# Patient Record
Sex: Female | Born: 1945
Health system: Southern US, Community
[De-identification: ages and names within clinical notes are randomized; demographics above are authoritative.]

## PROBLEM LIST (undated history)

## (undated) DIAGNOSIS — M1990 Unspecified osteoarthritis, unspecified site: Secondary | ICD-10-CM

## (undated) DIAGNOSIS — N189 Chronic kidney disease, unspecified: Secondary | ICD-10-CM

## (undated) DIAGNOSIS — M109 Gout, unspecified: Secondary | ICD-10-CM

## (undated) DIAGNOSIS — L80 Vitiligo: Secondary | ICD-10-CM

## (undated) DIAGNOSIS — M329 Systemic lupus erythematosus, unspecified: Secondary | ICD-10-CM

## (undated) DIAGNOSIS — R011 Cardiac murmur, unspecified: Secondary | ICD-10-CM

## (undated) DIAGNOSIS — H269 Unspecified cataract: Secondary | ICD-10-CM

## (undated) DIAGNOSIS — E785 Hyperlipidemia, unspecified: Secondary | ICD-10-CM

## (undated) DIAGNOSIS — D649 Anemia, unspecified: Secondary | ICD-10-CM

## (undated) DIAGNOSIS — IMO0002 Reserved for concepts with insufficient information to code with codable children: Secondary | ICD-10-CM

## (undated) DIAGNOSIS — I10 Essential (primary) hypertension: Secondary | ICD-10-CM

## (undated) DIAGNOSIS — T7840XA Allergy, unspecified, initial encounter: Secondary | ICD-10-CM

## (undated) DIAGNOSIS — E039 Hypothyroidism, unspecified: Secondary | ICD-10-CM

## (undated) DIAGNOSIS — I4891 Unspecified atrial fibrillation: Secondary | ICD-10-CM

## (undated) DIAGNOSIS — E119 Type 2 diabetes mellitus without complications: Secondary | ICD-10-CM

## (undated) DIAGNOSIS — J45909 Unspecified asthma, uncomplicated: Secondary | ICD-10-CM

## (undated) HISTORY — PX: EYE SURGERY: SHX253

## (undated) HISTORY — PX: TOOTH EXTRACTION: SUR596

## (undated) HISTORY — DX: Reserved for concepts with insufficient information to code with codable children: IMO0002

## (undated) HISTORY — DX: Systemic lupus erythematosus, unspecified: M32.9

## (undated) HISTORY — DX: Hyperlipidemia, unspecified: E78.5

## (undated) HISTORY — DX: Cardiac murmur, unspecified: R01.1

## (undated) HISTORY — DX: Gout, unspecified: M10.9

## (undated) HISTORY — DX: Unspecified atrial fibrillation: I48.91

## (undated) HISTORY — DX: Unspecified asthma, uncomplicated: J45.909

## (undated) HISTORY — DX: Anemia, unspecified: D64.9

## (undated) HISTORY — PX: DILATION AND CURETTAGE OF UTERUS: SHX78

## (undated) HISTORY — DX: Essential (primary) hypertension: I10

## (undated) HISTORY — DX: Unspecified cataract: H26.9

## (undated) HISTORY — DX: Type 2 diabetes mellitus without complications: E11.9

## (undated) HISTORY — PX: REPLACEMENT TOTAL KNEE: SUR1224

## (undated) HISTORY — DX: Allergy, unspecified, initial encounter: T78.40XA

## (undated) HISTORY — DX: Unspecified osteoarthritis, unspecified site: M19.90

---

## 2009-01-07 DIAGNOSIS — E119 Type 2 diabetes mellitus without complications: Secondary | ICD-10-CM | POA: Insufficient documentation

## 2009-01-07 DIAGNOSIS — J45909 Unspecified asthma, uncomplicated: Secondary | ICD-10-CM | POA: Insufficient documentation

## 2012-01-15 DIAGNOSIS — R9389 Abnormal findings on diagnostic imaging of other specified body structures: Secondary | ICD-10-CM | POA: Insufficient documentation

## 2012-03-02 DIAGNOSIS — B370 Candidal stomatitis: Secondary | ICD-10-CM | POA: Insufficient documentation

## 2012-06-17 ENCOUNTER — Encounter: Payer: Self-pay | Admitting: Internal Medicine

## 2012-06-17 ENCOUNTER — Ambulatory Visit (INDEPENDENT_AMBULATORY_CARE_PROVIDER_SITE_OTHER): Payer: Medicare Other | Admitting: Internal Medicine

## 2012-06-17 VITALS — BP 132/72 | HR 72 | Temp 98.3°F | Ht 59.0 in | Wt 195.6 lb

## 2012-06-17 DIAGNOSIS — J45909 Unspecified asthma, uncomplicated: Secondary | ICD-10-CM

## 2012-06-17 MED ORDER — BUDESONIDE-FORMOTEROL FUMARATE 160-4.5 MCG/ACT IN AERO: INHALATION_SPRAY | RESPIRATORY_TRACT | Status: DC

## 2012-06-17 NOTE — Patient Instructions (Addendum)
Stop theophylline and fosfamax and advair   Try prilosec 20mg   Take 30-60 min before first meal of the day and Pepcid 20 mg one bedtime until at bedtime  Try symbicort 160 Take 2 puffs first thing in am and then another 2 puffs about 12 hours later.    Work on inhaler technique:  relax and gently blow all the way out then take a nice smooth deep breath back in, triggering the inhaler at same time you start breathing in.  Hold for up to 5 seconds if you can.  Rinse and gargle with water when done   If your mouth or throat starts to bother you,   I suggest you time the inhaler to your dental care and after using the inhaler(s) brush teeth and tongue with a baking soda containing toothpaste and when you rinse this out, gargle with it first to see if this helps your mouth and throat.    Only use your albuterol as a rescue medication(use nebulizer at home and inhaler when out) to be used if you can't catch your breath by resting or doing a relaxed purse lip breathing pattern. The less you use it, the better it will work when you need it. Ok to use it up to every 4 hours if needed    GERD (REFLUX)  is an extremely common cause of respiratory symptoms, many times with no significant heartburn at all.    It can be treated with medication, but also with lifestyle changes including avoidance of late meals, excessive alcohol, smoking cessation, and avoid fatty foods, chocolate, peppermint, colas, red wine, and acidic juices such as orange juice.  NO MINT OR MENTHOL PRODUCTS SO NO COUGH DROPS  USE SUGARLESS CANDY INSTEAD (jolley ranchers or Stover's)  NO OIL BASED VITAMINS - use powdered substitutes.  Please schedule a follow up office visit in 2 weeks, sooner if needed

## 2012-06-17 NOTE — Progress Notes (Signed)
  Subjective:    Patient ID: Hannah [name], female    DOB: 18-Aug-1946  MRN: [id number]  HPI   58 yobf never smoker with asthma though may have had it as child per mom but didn't interfere with activities until in 38s and moved to [facility] august 2013 and referred 06/17/2012 to pulmonary clinic after decades of care in [place] by a pulmonary doctor on maint theoph and prednisone.  06/17/2012 1st pulmonary pulmonary eval cc chronic asthma on prednisone daily x years but still very saba dependent. Last er eval one year prior to OV. Using saba neb tid despite multiple maint rx. No obvious daytime variabilty or assoc chronic cough or cp or chest tightness, subjective wheeze overt sinus or hb symptoms. No unusual exp hx or h/o childhood pna/ asthma or premature birth to his knowledge.   Sleeping ok without nocturnal  or early am exacerbation  of respiratory  c/o's or need for noct saba. Also denies any obvious fluctuation of symptoms with weather or environmental changes or other aggravating or alleviating factors except as outlined above     Review of Systems  Constitutional: Negative for fever, chills, diaphoresis, activity change, appetite change, fatigue and unexpected weight change.  HENT: Negative for hearing loss, ear pain, nosebleeds, congestion, sore throat, facial swelling, rhinorrhea, sneezing, mouth sores, trouble swallowing, neck pain, neck stiffness, dental problem, voice change, postnasal drip, sinus pressure, tinnitus and ear discharge.   Eyes: Negative for photophobia, discharge, itching and visual disturbance.  Respiratory: Positive for cough, shortness of breath and wheezing. Negative for choking and chest tightness.   Cardiovascular: Negative for chest pain, palpitations and leg swelling.  Gastrointestinal: Negative for nausea, vomiting, abdominal pain, constipation, blood in stool and abdominal distention.  Genitourinary: Negative for dysuria, urgency, frequency, hematuria,  flank pain, decreased urine volume and difficulty urinating.  Musculoskeletal: Negative for myalgias, back pain, joint swelling, arthralgias and gait problem.  Skin: Negative for rash.  Neurological: Negative for dizziness, tremors, seizures, speech difficulty, weakness, light-headedness, numbness and headaches.  Hematological: Does not bruise/bleed easily.  Psychiatric/Behavioral: Negative for confusion, disturbed wake/sleep cycle and agitation. The patient is not nervous/anxious.        Objective:   Physical Exam Obese amb bf with mod pseudowheeze HEENT: nl dentition, turbinates, and orophanx. Nl external ear canals without cough reflex   NECK :  without JVD/Nodes/TM/ nl carotid upstrokes bilaterally   LUNGS: no acc muscle use, clear to A and P bilaterally without cough on insp or exp maneuvers   CV:  RRR  no s3 or murmur or increase in P2, no edema   ABD:  soft and nontender with nl excursion in the supine position. No bruits or organomegaly, bowel sounds nl  MS:  warm without deformities, calf tenderness, cyanosis or clubbing  SKIN: warm and dry without lesions    NEURO:  alert, approp, no deficits         Assessment & Plan:

## 2012-06-19 DIAGNOSIS — J45909 Unspecified asthma, uncomplicated: Secondary | ICD-10-CM | POA: Insufficient documentation

## 2012-06-19 NOTE — Assessment & Plan Note (Addendum)
Symptoms are markedly disproportionate to objective findings and not clear this is all a lung problem but pt does appear to have difficult airway management issues and has been placed on an incredibly complex regimen by previous pulmonologist  DDX of  difficult airways managment all start with A and  include Adherence, Ace Inhibitors, Acid Reflux, Active Sinus Disease, Alpha 1 Antitripsin deficiency, Anxiety masquerading as Airways dz,  ABPA,  allergy(esp in young), Aspiration (esp in elderly), Adverse effects of DPI,  Active smokers, plus two Bs  = Bronchiectasis and Beta blocker use..and one C= CHF   Adherence is always the initial "prime suspect" and is a multilayered concern that requires a "trust but verify" approach in every patient - starting with knowing how to use medications, especially inhalers, correctly, keeping up with refills and understanding the fundamental difference between maintenance and prns vs those medications only taken for a very short course and then stopped and not refilled. The proper method of use, as well as anticipated side effects, of a metered-dose inhaler are discussed and demonstrated to the patient. Improved effectiveness after extensive coaching during this visit to a level of approximately  25% so this will need to improve or she needs performist/budesonide neb  ? Acid reflux > rx plus diet and trial off fosfamax and theoph   ? Adverse effect of advair> pseudowheeze on exam > try teach hfa symbicort 160 2bid trial

## 2012-07-01 ENCOUNTER — Ambulatory Visit (INDEPENDENT_AMBULATORY_CARE_PROVIDER_SITE_OTHER): Payer: Medicare Other | Admitting: Internal Medicine

## 2012-07-01 ENCOUNTER — Encounter: Payer: Self-pay | Admitting: Internal Medicine

## 2012-07-01 VITALS — BP 120/60 | HR 65 | Temp 98.4°F | Ht 59.0 in | Wt 192.6 lb

## 2012-07-01 DIAGNOSIS — J45909 Unspecified asthma, uncomplicated: Secondary | ICD-10-CM

## 2012-07-01 NOTE — Progress Notes (Signed)
  Subjective:    Patient ID: Hannah [name], female    DOB: November 01, 1945  MRN: [id number]  HPI   59 yobf never smoker with asthma though may have had it as child per mom but didn't interfere with activities until in 24s and moved to [facility] august 2013 and referred 06/17/2012 to pulmonary clinic after decades of care in  by a pulmonary doctor on maint theoph and prednisone.  06/17/2012 1st pulmonary pulmonary eval cc chronic asthma on prednisone daily x years but still very saba dependent. Last er eval one year prior to OV. Using saba neb tid despite multiple maint rx. No obvious daytime variabilty or assoc chronic cough or cp or chest tightness, subjective wheeze overt sinus or hb symptoms. No unusual exp hx or h/o childhood pna/ asthma or premature birth to her knowledge. rec Stop theophylline and fosfamax and advair  Try prilosec 20mg   Take 30-60 min before first meal of the day and Pepcid 20 mg one bedtime   Try symbicort 160 Take 2 puffs first thing in am and then another 2 puffs about 12 hours later.  Work on inhaler technique GERD   07/01/2012 f/u ov/Corneilus Heggie cc sob better,  Prednisone 10 mg one half daily, ventolin use is down, less neb, ran out of accolate x one week no worse symptoms, no cough.    Sleeping ok without nocturnal  or early am exacerbation  of respiratory  c/o's or need for noct saba. Also denies any obvious fluctuation of symptoms with weather or environmental changes or other aggravating or alleviating factors except as outlined above   ROS  The following are not active complaints unless bolded sore throat, dysphagia, dental problems, itching, sneezing,  nasal congestion or excess/ purulent secretions, ear ache,   fever, chills, sweats, unintended wt loss, pleuritic or exertional cp, hemoptysis,  orthopnea pnd or leg swelling, presyncope, palpitations, heartburn, abdominal pain, anorexia, nausea, vomiting, diarrhea  or change in bowel or urinary habits, change  in stools or urine, dysuria,hematuria,  rash, arthralgias, visual complaints, headache, numbness weakness or ataxia or problems with walking or coordination,  change in mood/affect or memory.              Objective:   Physical Exam  Wt Readings from Last 3 Encounters:  07/01/12 192 lb 9.6 oz (87.363 kg)  06/17/12 195 lb 9.6 oz (88.724 kg)    Obese amb bf with min pseudowheeze  HEENT: nl dentition, turbinates, and orophanx. Nl external ear canals without cough reflex   NECK :  without JVD/Nodes/TM/ nl carotid upstrokes bilaterally   LUNGS: no acc muscle use, clear to A and P bilaterally without cough on insp or exp maneuvers   CV:  RRR  no s3 or murmur or increase in P2, no edema   ABD:  soft and nontender with nl excursion in the supine position. No bruits or organomegaly, bowel sounds nl  MS:  warm without deformities, calf tenderness, cyanosis or clubbing            Assessment & Plan:

## 2012-07-01 NOTE — Patient Instructions (Signed)
Prednisone 10 mg one half every other day (even days) unless more trouble breathing or needing more rescue  Plan A Symbicort 160 Take 2 puffs first thing in am and then another 2 puffs about 12 hours later no matter  Plan B ventolin use it only if you can't catch your breath  Plan C Albuterol nebulizer use only after you've tried plan B  Plan D Call Doctor  Plan E > ER   Please schedule a follow up office visit in 4 weeks, sooner if needed

## 2012-07-01 NOTE — Assessment & Plan Note (Signed)
-   HFA 25% 06/17/12 > 25% 07/01/2012 with poor count correlation (must not be triggering it)  The proper method of use, as well as anticipated side effects, of a metered-dose inhaler are discussed and demonstrated to the patient. Improved effectiveness after extensive coaching during this visit to a level of approximately  75% at end of teaching  Dramatic improvement but still struggling with concept of maint vs rescue action plans  I had an extended discussion with the patient today lasting 15 to 20 minutes of a 25 minute visit on the following issues:   Continue symbicort 160 2 bid, reduce pred to 10 mg one half qod and f/u in 4 weeks

## 2012-07-29 ENCOUNTER — Encounter: Payer: Self-pay | Admitting: Internal Medicine

## 2012-07-29 ENCOUNTER — Ambulatory Visit (INDEPENDENT_AMBULATORY_CARE_PROVIDER_SITE_OTHER): Payer: Medicare Other | Admitting: Internal Medicine

## 2012-07-29 VITALS — BP 128/72 | HR 73 | Temp 98.2°F | Ht 58.5 in | Wt 188.0 lb

## 2012-07-29 DIAGNOSIS — J45909 Unspecified asthma, uncomplicated: Secondary | ICD-10-CM

## 2012-07-29 DIAGNOSIS — R9389 Abnormal findings on diagnostic imaging of other specified body structures: Secondary | ICD-10-CM

## 2012-07-29 MED ORDER — BUDESONIDE-FORMOTEROL FUMARATE 160-4.5 MCG/ACT IN AERO: INHALATION_SPRAY | RESPIRATORY_TRACT | Status: DC

## 2012-07-29 NOTE — Progress Notes (Signed)
Subjective:    Patient ID: Hannah [name], female    DOB: [birth year]  MRN: [id number]  HPI   89 yobf never smoker with asthma though may have had it as child per mom but didn't interfere with activities until in 62s and moved to [facility] august 2013 and referred 06/17/2012 to pulmonary clinic after decades of care in [place] by a pulmonary doctor on maint theoph and prednisone.  06/17/2012 1st pulmonary pulmonary eval cc chronic asthma on prednisone daily x years but still very saba dependent. Last er eval one year prior to OV. Using saba neb tid despite multiple maint rx.  rec Stop theophylline and fosfamax and advair  Try prilosec 20mg   Take 30-60 min before first meal of the day and Pepcid 20 mg one bedtime   Try symbicort 160 Take 2 puffs first thing in am and then another 2 puffs about 12 hours later.  Work on inhaler technique GERD   07/01/2012 f/u ov/Johnita Palleschi cc sob better,  Prednisone 10 mg one half daily, ventolin use is down, less neb, ran out of accolate x one week no worse symptoms, no cough. rec Prednisone 10 mg one half every other day (even days) unless more trouble breathing or needing more rescue Plan A Symbicort 160 Take 2 puffs first thing in am and then another 2 puffs about 12 hours later no matter Plan B ventolin use it only if you can't catch your breath Plan C Albuterol nebulizer use only after you've tried plan B Plan D Call Doctor Plan E > ER    07/29/2012 f/u ov/Jawuan Robb cc sob much better @ taper down Prednisone 10 mg one half each am and symbicort 160 2bid and no needed saba at all. No obvious daytime variabilty or assoc chronic cough or cp or chest tightness, subjective wheeze overt sinus or hb symptoms. No unusual exp hx or h/o childhood pna/ asthma or premature birth to his knowledge.     Sleeping ok without nocturnal  or early am exacerbation  of respiratory  c/o's or need for noct saba. Also denies any obvious fluctuation of symptoms with weather or  environmental changes or other aggravating or alleviating factors except as outlined above   ROS  The following are not active complaints unless bolded sore throat, dysphagia, dental problems, itching, sneezing,  nasal congestion or excess/ purulent secretions, ear ache,   fever, chills, sweats, unintended wt loss, pleuritic or exertional cp, hemoptysis,  orthopnea pnd or leg swelling, presyncope, palpitations, heartburn, abdominal pain, anorexia, nausea, vomiting, diarrhea  or change in bowel or urinary habits, change in stools or urine, dysuria,hematuria,  rash, arthralgias, visual complaints, headache, numbness weakness or ataxia or problems with walking or coordination,  change in mood/affect or memory.              Objective:   Physical Exam Wt 07/29/2012  188 Wt Readings from Last 3 Encounters:  07/01/12 192 lb 9.6 oz (87.363 kg)  06/17/12 195 lb 9.6 oz (88.724 kg)    Obese amb bf with min pseudowheeze  HEENT: nl dentition, turbinates, and orophanx. Nl external ear canals without cough reflex   NECK :  without JVD/Nodes/TM/ nl carotid upstrokes bilaterally   LUNGS: no acc muscle use, clear to A and P bilaterally without cough on insp or exp maneuvers   CV:  RRR  no s3 or murmur or increase in P2, no edema   ABD:  soft and nontender with nl excursion in the supine position. No bruits  or organomegaly, bowel sounds nl  MS:  warm without deformities, calf tenderness, cyanosis or clubbing            Assessment & Plan:

## 2012-07-29 NOTE — Patient Instructions (Addendum)
Work on inhaler technique:  relax and gently blow all the way out then take a nice smooth deep breath back in, triggering the inhaler at same time you start breathing in.  Hold for up to 5 seconds if you can.  Rinse and gargle with water when done   If your mouth or throat starts to bother you,   I suggest you time the inhaler to your dental care and after using the inhaler(s) brush teeth and tongue with a baking soda containing toothpaste and when you rinse this out, gargle with it first to see if this helps your mouth and throat.     Continue symbicort 160 Take 2 puffs first thing in am and then another 2 puffs about 12 hours later.   Only use ventolin if needed  Prednisone 10 mg one half every other day x 2 weeks, then one half third day x 2 weeks and stop and if get more short of breath or queezy or nauseated resume the previous dose.  Please schedule a follow up office visit in 6 weeks, call sooner if needed pft's  Late add:  Also needs cxr on return to f/u bilateral cystic dz

## 2012-07-30 DIAGNOSIS — R9389 Abnormal findings on diagnostic imaging of other specified body structures: Secondary | ICD-10-CM | POA: Insufficient documentation

## 2012-07-30 NOTE — Assessment & Plan Note (Signed)
-   CT chest 02/26/12 report only:  Multiple bilateral thin walled cysts no change since 07/25/11  Although there are clearly abnormalities on CT scan, they should probably be considered "microscopic" since not obvious on plain cxr .     In the setting of obvious "macroscopic" health issues,  I am very reluctatnt to embark on an invasive w/u at this point but will arrange consevative  follow up and in the meantime see what we can do to address the patient's subjective concerns.    Discussed in detail all the  indications, usual  risks and alternatives  relative to the benefits with patient who agrees to proceed with conservative f/u with pft's and cxr at next [facility]

## 2012-07-30 NOTE — Assessment & Plan Note (Signed)
I had an extended discussion with the patient today lasting 15 to 20 minutes of a 25 minute visit on the following issues:   Lack of symptom resolution could mean a wrong diagnosis, persistence of the disease state (like bronchiectasis, which she doesn't have although CT is abn, discussed separately) , or inadequacy of currently available therapy (eg no rx available for non-acid gerd) .  She clearly is not "refractory asthmatic" which was her working dx on arrival, but not entirely clear what the basis of all her symptoms are and we have nothing available longterm to treat non -acid gerd, for which she is at risk.  For now goal is to wean effectively off prednisone then regroup with pfts  See instructions for specific recommendations which were reviewed directly with the patient who was given a copy with highlighter outlining the key components.

## 2012-08-24 ENCOUNTER — Telehealth: Payer: Self-pay | Admitting: Internal Medicine

## 2012-08-24 MED ORDER — BUDESONIDE-FORMOTEROL FUMARATE 160-4.5 MCG/ACT IN AERO: INHALATION_SPRAY | RESPIRATORY_TRACT | Status: DC

## 2012-08-24 NOTE — Telephone Encounter (Signed)
Pharmacist has called for PA on Symbicort. Member # [id number]. I called [employer] at [phone] and was told that the computer system was down and they could not process a PA and we should try again later.   The pt is aware that we are leaving her a sample to pick up so she will not be out of her medication. We will try again later to call for PA.

## 2012-08-24 NOTE — Telephone Encounter (Signed)
Medication has been sent to her pharmacy. I tried calling but I didn't get an answer and I couldn't leave a message.

## 2012-08-29 NOTE — Telephone Encounter (Signed)
Pt picked up the Symbicort on 08/24/12 and nothing further is needed. It did not need a PA. I have left a msg for the pt to return triage call to make sure she had no further questions.

## 2012-08-30 NOTE — Telephone Encounter (Signed)
Pt is aware sample of symbicort is here for her to pick up & will p/u at her 09/08/18 appt.  Pt states nothing further needed at this time.  Antionette Fairy

## 2012-09-07 ENCOUNTER — Other Ambulatory Visit: Payer: Self-pay | Admitting: *Deleted

## 2012-09-07 ENCOUNTER — Telehealth: Payer: Self-pay | Admitting: *Deleted

## 2012-09-07 DIAGNOSIS — J45909 Unspecified asthma, uncomplicated: Secondary | ICD-10-CM

## 2012-09-07 NOTE — Telephone Encounter (Signed)
Pt wanted know should she take her neb machine with her on a trip she is taking soon, she will be gone 3 days. Pt states she has not had to use it for 3 months. I advised the pt it was up to her if she wanted to take it or her rescue inhaler but to make sure she has one or the other. Carron Curie, CMA

## 2012-09-07 NOTE — Telephone Encounter (Signed)
Pt rescheduled appt says she's going out of town next week wants to know if she should take her breathing machine with her can be reached at [phone] says ok to lmom.Hannah [name]

## 2012-09-07 NOTE — Telephone Encounter (Signed)
Pt needs to have PFT before her appt with MW tomorrow. Nothing available here, and I called [facility] and no appts available there as well. Pt will need to r/s appt with MW with PFT prior. LMTCB.  Carron Curie, CMA

## 2012-09-07 NOTE — Telephone Encounter (Signed)
lmomtcb x1 breathing machine?? Nebulizer? Oxygen? concentrator?

## 2012-09-08 ENCOUNTER — Ambulatory Visit: Payer: Medicare Other | Admitting: Internal Medicine

## 2012-09-26 ENCOUNTER — Ambulatory Visit
Admission: RE | Admit: 2012-09-26 | Discharge: 2012-09-26 | Disposition: A | Discharge status: Home / Self Care | Payer: Medicare Other | Source: Ambulatory Visit | Attending: Family Medicine | Admitting: Family Medicine

## 2012-09-26 ENCOUNTER — Other Ambulatory Visit: Payer: Self-pay | Admitting: Family Medicine

## 2012-09-26 DIAGNOSIS — M1250 Traumatic arthropathy, unspecified site: Secondary | ICD-10-CM

## 2012-10-18 ENCOUNTER — Ambulatory Visit (INDEPENDENT_AMBULATORY_CARE_PROVIDER_SITE_OTHER): Payer: Medicare PPO | Admitting: Internal Medicine

## 2012-10-18 ENCOUNTER — Encounter: Payer: Self-pay | Admitting: Internal Medicine

## 2012-10-18 ENCOUNTER — Ambulatory Visit (INDEPENDENT_AMBULATORY_CARE_PROVIDER_SITE_OTHER)
Admission: RE | Admit: 2012-10-18 | Discharge: 2012-10-18 | Disposition: A | Discharge status: Home / Self Care | Payer: Medicare PPO | Source: Ambulatory Visit | Attending: Internal Medicine | Admitting: Internal Medicine

## 2012-10-18 VITALS — BP 112/64 | HR 73 | Temp 98.1°F | Ht 59.0 in | Wt 176.0 lb

## 2012-10-18 DIAGNOSIS — J45909 Unspecified asthma, uncomplicated: Secondary | ICD-10-CM

## 2012-10-18 DIAGNOSIS — R9389 Abnormal findings on diagnostic imaging of other specified body structures: Secondary | ICD-10-CM

## 2012-10-18 LAB — PULMONARY FUNCTION TEST

## 2012-10-18 NOTE — Assessment & Plan Note (Signed)
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  The proper method of use, as well as anticipated side effects, of a metered-dose inhaler are discussed and demonstrated to the patient. Improved effectiveness after extensive coaching during this visit to a level of approximately  50%    Each maintenance medication was reviewed in detail including most importantly the difference between maintenance and as needed and under what circumstances the prns are to be used.  Please see instructions for details which were reviewed in writing and the patient given a copy.

## 2012-10-18 NOTE — Progress Notes (Signed)
PFT done today. 

## 2012-10-18 NOTE — Patient Instructions (Signed)
Please remember to go to the x-ray department downstairs for your tests - we will call you with the results when they are available.  Ok to just use symbicort when you're having problems at a dose of 2 puffs every 12 hours   Please schedule a follow up visit in 3 months but call sooner if needed

## 2012-10-18 NOTE — Progress Notes (Signed)
Subjective:    Patient ID: Hannah [name], female    DOB: 26-Feb-1946  MRN: [id number]  HPI   46 yobf never smoker with asthma though may have had it as child per mom but didn't interfere with activities until in 57s and moved to [facility] august 2013 and referred 06/17/2012 to pulmonary clinic after decades of care in [place] by a pulmonary doctor on maint theoph and prednisone.  06/17/2012 1st pulmonary pulmonary eval cc chronic asthma on prednisone daily x years but still very saba dependent. Last er eval one year prior to OV. Using saba neb tid despite multiple maint rx.  rec Stop theophylline and fosfamax and advair  Try prilosec 20mg   Take 30-60 min before first meal of the day and Pepcid 20 mg one bedtime   Try symbicort 160 Take 2 puffs first thing in am and then another 2 puffs about 12 hours later.  Work on inhaler technique GERD   07/01/2012 f/u ov/Miki Blank cc sob better,  Prednisone 10 mg one half daily, ventolin use is down, less neb, ran out of accolate x one week no worse symptoms, no cough. rec Prednisone 10 mg one half every other day (even days) unless more trouble breathing or needing more rescue Plan A Symbicort 160 Take 2 puffs first thing in am and then another 2 puffs about 12 hours later no matter Plan B ventolin use it only if you can't catch your breath Plan C Albuterol nebulizer use only after you've tried plan B Plan D Call Doctor Plan E > ER    07/29/2012 f/u ov/Markeshia Giebel cc sob much better @ taper down Prednisone 10 mg one half each am and symbicort 160 2bid and no needed saba at all.  rec Work on inhaler technique:   Continue symbicort 160 Take 2 puffs first thing in am and then another 2 puffs about 12 hours later.  Only use ventolin if needed Prednisone 10 mg one half every other day x 2 weeks, then one half third day x 2 weeks and stop and if get more short of breath or queezy or nauseated resume the previous dose.      10/18/2012 f/u ov/Tobey Lippard cc no  sob, no cough, stopped symbicort on her own (not the instructions given) but no need for it nor problem coming off prednisone which is helping her lose  wt    No obvious daytime variabilty or assoc chronic cough or cp or chest tightness, subjective wheeze overt sinus or hb symptoms. No unusual exp hx or h/o childhood pna/ asthma or premature birth to her  knowledge.     Sleeping ok without nocturnal  or early am exacerbation  of respiratory  c/o's or need for noct saba. Also denies any obvious fluctuation of symptoms with weather or environmental changes or other aggravating or alleviating factors except as outlined above   ROS  The following are not active complaints unless bolded sore throat, dysphagia, dental problems, itching, sneezing,  nasal congestion or excess/ purulent secretions, ear ache,   fever, chills, sweats, unintended wt loss, pleuritic or exertional cp, hemoptysis,  orthopnea pnd or leg swelling, presyncope, palpitations, heartburn, abdominal pain, anorexia, nausea, vomiting, diarrhea  or change in bowel or urinary habits, change in stools or urine, dysuria,hematuria,  rash, arthralgias, visual complaints, headache, numbness weakness or ataxia or problems with walking or coordination,  change in mood/affect or memory.              Objective:   Physical Exam  Wt 07/29/2012  188 vs 176 10/18/2012  Wt Readings from Last 3 Encounters:  07/01/12 192 lb 9.6 oz (87.363 kg)  06/17/12 195 lb 9.6 oz (88.724 kg)    Obese amb bf    HEENT: nl dentition, turbinates, and orophanx. Nl external ear canals without cough reflex   NECK :  without JVD/Nodes/TM/ nl carotid upstrokes bilaterally   LUNGS: no acc muscle use, clear to A and P bilaterally without cough on insp or exp maneuvers   CV:  RRR  no s3 or murmur or increase in P2, no edema   ABD:  soft and nontender with nl excursion in the supine position. No bruits or organomegaly, bowel sounds nl  MS:  warm without  deformities, calf tenderness, cyanosis or clubbing  CXR  10/18/2012 :    5 cm mass along the posterior diaphragm on the right. 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 Correlation with CT is  suggested            Assessment & Plan:

## 2012-10-18 NOTE — Assessment & Plan Note (Addendum)
Present cxr suggests diaphragmatic hernia but no mention of this on outside CT so need a copy of the ct itself or old cxr's to complete the w/u

## 2012-10-19 ENCOUNTER — Telehealth: Payer: Self-pay | Admitting: Internal Medicine

## 2012-10-19 NOTE — Telephone Encounter (Signed)
Spoke with pt She is calling to let MW know that she is going to bring her CT chest report from 2012 that she had done in [place]. Nothing further needed. Will forward to MW as Lorain Childes

## 2012-10-19 NOTE — Telephone Encounter (Signed)
Pt aware that she should bring disc to next ov for MW to look at Pt verbalized understanding and nothing further needed

## 2012-10-19 NOTE — Telephone Encounter (Signed)
Spoke with pt She states was able to find some old records, had a ct chest in 09 and 12 that did mention the hernia She states will bring these records to her next appt in 3 months Nothing further needed

## 2012-10-19 NOTE — Telephone Encounter (Signed)
Pt has called back & can be reached at [phone].  Hannah [name]

## 2012-10-19 NOTE — Telephone Encounter (Signed)
I've already got it - what I need is the actual CD of ct's and cxr's to wrap it all up and compare apples to apples

## 2012-10-19 NOTE — Telephone Encounter (Signed)
Notes Recorded by Nyoka Cowden, MD on 10/18/2012 at 3:59 PM Call pt: Reviewed cxr and looks like has a hernia of no consequence but really need last cxr or report to compare to be sure --  I spoke with patient about results and she verbalized understanding and had no questions. Pt stated she is going to call her old doctor and see if she had any old CXR in the past. Will hold message open until she gets back with [place].

## 2013-01-16 ENCOUNTER — Ambulatory Visit (INDEPENDENT_AMBULATORY_CARE_PROVIDER_SITE_OTHER): Payer: Medicare PPO | Admitting: Internal Medicine

## 2013-01-16 ENCOUNTER — Encounter: Payer: Self-pay | Admitting: Internal Medicine

## 2013-01-16 VITALS — BP 122/80 | HR 65 | Temp 97.8°F | Ht 59.0 in | Wt 170.4 lb

## 2013-01-16 DIAGNOSIS — R9389 Abnormal findings on diagnostic imaging of other specified body structures: Secondary | ICD-10-CM

## 2013-01-16 DIAGNOSIS — J45909 Unspecified asthma, uncomplicated: Secondary | ICD-10-CM

## 2013-01-16 NOTE — Patient Instructions (Addendum)
Try on pepcid 20 mg 2x daily for two weeks then taper off from there   Stop protonix for now, restart if reflux worse  GERD (REFLUX)  is an extremely common cause of respiratory symptoms, many times with no significant heartburn at all.    It can be treated with medication, but also with lifestyle changes including avoidance of late meals, excessive alcohol, smoking cessation, and avoid fatty foods, chocolate, peppermint, colas, red wine, and acidic juices such as orange juice.  NO MINT OR MENTHOL PRODUCTS SO NO COUGH DROPS  USE SUGARLESS CANDY INSTEAD (jolley ranchers or Stover's)  NO OIL BASED VITAMINS - use powdered substitutes.    If you are satisfied with your treatment plan let your doctor know and he/she can either refill your medications or you can return here when your prescription runs out.     If in any way you are not 100% satisfied,  please tell us.  If 100% better, tell your friends!

## 2013-01-16 NOTE — Progress Notes (Signed)
Subjective:    Patient ID: Hannah [name], female    DOB: 14-May-1946  MRN: [id number]  HPI   39 yobf never smoker with asthma though may have had it as child per mom but didn't interfere with activities until in 66s and moved to [facility] august 2013 and referred 06/17/2012 to pulmonary clinic after decades of care in [place] by a pulmonary doctor on maint theoph and prednisone.  06/17/2012 1st pulmonary pulmonary eval cc chronic asthma on prednisone daily x years but still very saba dependent. Last er eval one year prior to OV. Using saba neb tid despite multiple maint rx.  rec Stop theophylline and fosfamax and advair  Try prilosec 20mg   Take 30-60 min before first meal of the day and Pepcid 20 mg one bedtime   Try symbicort 160 Take 2 puffs first thing in am and then another 2 puffs about 12 hours later.  Work on inhaler technique GERD   07/01/2012 f/u ov/Hannah [name] cc sob better,  Prednisone 10 mg one half daily, ventolin use is down, less neb, ran out of accolate x one week no worse symptoms, no cough. rec Prednisone 10 mg one half every other day (even days) unless more trouble breathing or needing more rescue Plan A Symbicort 160 Take 2 puffs first thing in am and then another 2 puffs about 12 hours later no matter Plan B ventolin use it only if you can't catch your breath Plan C Albuterol nebulizer use only after you've tried plan B Plan D Call Doctor Plan E > ER    07/29/2012 f/u ov/Hannah [name] cc sob much better @ taper down Prednisone 10 mg one half each am and symbicort 160 2bid and no needed saba at all.  rec Work on inhaler technique:   Continue symbicort 160 Take 2 puffs first thing in am and then another 2 puffs about 12 hours later.  Only use ventolin if needed Prednisone 10 mg one half every other day x 2 weeks, then one half third day x 2 weeks and stop and if get more short of breath or queezy or nauseated resume the previous dose.      10/18/2012 f/u ov/Hannah [name] cc no  sob, no cough, stopped symbicort on her own (not the instructions given) but no need for it nor problem coming off prednisone which is helping her lose  wt  rec  Ok to just use symbicort when you're having problems at a dose of 2 puffs every 12 hours   01/16/2013 f/u ov/Hannah [name] f/u abn cxr with Pos [place] hernia on CT confirmed 07/08/11  Plus ? LAM Chief Complaint  Patient presents with  . Office Visit    Pt. reports that her breathing is doing good.  Pt. denies any sob, wheezing and no chest tightness.  Still on acid suppression and not sure she needs it but no sob or need for daytime saba and rarely even using symbicort at this point   No obvious daytime variabilty or assoc chronic cough or cp or chest tightness, subjective wheeze overt sinus or hb symptoms. No unusual exp hx or h/o childhood pna/ asthma or premature birth to her  knowledge.     Sleeping ok without nocturnal  or early am exacerbation  of respiratory  c/o's or need for noct saba. Also denies any obvious fluctuation of symptoms with weather or environmental changes or other aggravating or alleviating factors except as outlined above   ROS  The following are not active complaints unless bolded  sore throat, dysphagia, dental problems, itching, sneezing,  nasal congestion or excess/ purulent secretions, ear ache,   fever, chills, sweats, unintended wt loss, pleuritic or exertional cp, hemoptysis,  orthopnea pnd or leg swelling, presyncope, palpitations, heartburn, abdominal pain, anorexia, nausea, vomiting, diarrhea  or change in bowel or urinary habits, change in stools or urine, dysuria,hematuria,  rash, arthralgias, visual complaints, headache, numbness weakness or ataxia or problems with walking or coordination,  change in mood/affect or memory.              Objective:   Physical Exam Wt 07/29/2012  188 vs 176 10/18/2012 > 01/16/2013  170  Wt Readings from Last 3 Encounters:  07/01/12 192 lb 9.6 oz (87.363 kg)  06/17/12  195 lb 9.6 oz (88.724 kg)    Obese amb bf    HEENT: nl dentition, turbinates, and orophanx. Nl external ear canals without cough reflex   NECK :  without JVD/Nodes/TM/ nl carotid upstrokes bilaterally   LUNGS: no acc muscle use, clear to A and P bilaterally without cough on insp or exp maneuvers   CV:  RRR  no s3 or murmur or increase in P2, no edema   ABD:  soft and nontender with nl excursion in the supine position. No bruits or organomegaly, bowel sounds nl  MS:  warm without deformities, calf tenderness, cyanosis or clubbing     CXR  10/18/2012 :  5 cm mass along the posterior diaphragm on the right. 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 Correlation with CT is  suggested            Assessment & Plan:

## 2013-01-16 NOTE — Assessment & Plan Note (Addendum)
-   HFA 50% 10/18/2012   - PFT's 10/18/2012 FEV1  1.63 (99%) ratio 70 and no better p B2, DLCO 85 corrects to 124   She has improved from refractory asthma/ steroid dep to no asthma, steroid independent and may not even have significant gerd now that she's loosing wt so ok to taper and see if it flares, the reverse of a therapeutic trial.    Each maintenance medication was reviewed in detail including most importantly the difference between maintenance and as needed and under what circumstances the prns are to be used.  Please see instructions for details which were reviewed in writing and the patient given a copy.

## 2013-01-17 NOTE — Assessment & Plan Note (Signed)
Not clear what the significance of the cysts is but no seen on plain cxr and she is symptom free so no need to do serial ct, will f/u prn symptoms or abn cxr.  I am delighted we've able to write this nice lady a much shorter list of medications and successfully weaned from systemic steroids. Pulmonary f/u can be prn

## 2013-03-10 ENCOUNTER — Encounter: Payer: Self-pay | Admitting: Internal Medicine

## 2013-09-28 HISTORY — PX: CATARACT EXTRACTION: SUR2

## 2013-10-13 ENCOUNTER — Other Ambulatory Visit: Payer: Self-pay | Admitting: Internal Medicine

## 2014-07-05 ENCOUNTER — Other Ambulatory Visit: Payer: Self-pay | Admitting: Nurse Practitioner

## 2014-07-05 ENCOUNTER — Ambulatory Visit
Admission: RE | Admit: 2014-07-05 | Discharge: 2014-07-05 | Disposition: A | Discharge status: Home / Self Care | Payer: Commercial Managed Care - HMO | Source: Ambulatory Visit | Attending: Nurse Practitioner | Admitting: Nurse Practitioner

## 2014-07-05 DIAGNOSIS — M25362 Other instability, left knee: Secondary | ICD-10-CM

## 2014-10-01 DIAGNOSIS — H2512 Age-related nuclear cataract, left eye: Secondary | ICD-10-CM | POA: Diagnosis not present

## 2014-10-02 DIAGNOSIS — I129 Hypertensive chronic kidney disease with stage 1 through stage 4 chronic kidney disease, or unspecified chronic kidney disease: Secondary | ICD-10-CM | POA: Diagnosis not present

## 2014-10-08 DIAGNOSIS — H2512 Age-related nuclear cataract, left eye: Secondary | ICD-10-CM | POA: Diagnosis not present

## 2014-10-08 DIAGNOSIS — H268 Other specified cataract: Secondary | ICD-10-CM | POA: Diagnosis not present

## 2014-10-26 ENCOUNTER — Other Ambulatory Visit: Payer: Self-pay | Admitting: Internal Medicine

## 2014-10-29 DIAGNOSIS — R062 Wheezing: Secondary | ICD-10-CM | POA: Diagnosis not present

## 2014-10-29 DIAGNOSIS — J45901 Unspecified asthma with (acute) exacerbation: Secondary | ICD-10-CM | POA: Diagnosis not present

## 2014-10-29 DIAGNOSIS — I10 Essential (primary) hypertension: Secondary | ICD-10-CM | POA: Diagnosis not present

## 2014-10-29 DIAGNOSIS — J209 Acute bronchitis, unspecified: Secondary | ICD-10-CM | POA: Diagnosis not present

## 2014-12-07 DIAGNOSIS — Z1231 Encounter for screening mammogram for malignant neoplasm of breast: Secondary | ICD-10-CM | POA: Diagnosis not present

## 2014-12-17 ENCOUNTER — Encounter: Payer: Self-pay | Admitting: *Deleted

## 2014-12-17 ENCOUNTER — Other Ambulatory Visit: Payer: Commercial Managed Care - HMO | Admitting: *Deleted

## 2014-12-17 DIAGNOSIS — E039 Hypothyroidism, unspecified: Secondary | ICD-10-CM | POA: Diagnosis not present

## 2014-12-17 DIAGNOSIS — M109 Gout, unspecified: Secondary | ICD-10-CM

## 2014-12-17 HISTORY — DX: Gout, unspecified: M10.9

## 2014-12-17 NOTE — Patient Outreach (Signed)
   12/17/2014   Hannah [name] [birth year] [id number]  Telephone call to patient, HIPPA verification received.  Patient advised of [facility] care management services.   Patient states she has no major health concerns at this time.  States chronic medical conditions are currently under  control with medications, diet and exercise. Seeing primary care doctors every 3-4 months and in between if needed. States she is accessing specialists without problems and making appointment to see them at recommended time frames.  Patient drives herself to doctors' appointments.   No hospital stays or emergency rooms visits in last several years.   Getting prescriptions meds without problem from local pharmacy.  Is aware of mail order pharmacy and uses as she  desires.  States she is taking all of her meds as prescribed by her doctors.   Patient is knowledgable of her medical conditions and knows action plan to use if experiencing problem symptoms.   Patient has been assessed no further interventions required at this time.  Patient advised of [facility] contact numbers and 24 hour nurse line. Will send [facility] brochure and magnet. Will notify MD of case closure.    Sherrin Daisy, RN BSN [facility] Management Coordinator [facility] Care Management  [phone]

## 2015-01-15 DIAGNOSIS — N08 Glomerular disorders in diseases classified elsewhere: Secondary | ICD-10-CM | POA: Diagnosis not present

## 2015-01-15 DIAGNOSIS — I129 Hypertensive chronic kidney disease with stage 1 through stage 4 chronic kidney disease, or unspecified chronic kidney disease: Secondary | ICD-10-CM | POA: Diagnosis not present

## 2015-01-15 DIAGNOSIS — N183 Chronic kidney disease, stage 3 (moderate): Secondary | ICD-10-CM | POA: Diagnosis not present

## 2015-01-15 DIAGNOSIS — E1122 Type 2 diabetes mellitus with diabetic chronic kidney disease: Secondary | ICD-10-CM | POA: Diagnosis not present

## 2015-01-15 DIAGNOSIS — Z0000 Encounter for general adult medical examination without abnormal findings: Secondary | ICD-10-CM | POA: Diagnosis not present

## 2015-01-22 DIAGNOSIS — M79606 Pain in leg, unspecified: Secondary | ICD-10-CM | POA: Diagnosis not present

## 2015-01-22 DIAGNOSIS — I10 Essential (primary) hypertension: Secondary | ICD-10-CM | POA: Diagnosis not present

## 2015-01-23 DIAGNOSIS — M79606 Pain in leg, unspecified: Secondary | ICD-10-CM | POA: Diagnosis not present

## 2015-01-23 DIAGNOSIS — I10 Essential (primary) hypertension: Secondary | ICD-10-CM | POA: Diagnosis not present

## 2015-02-11 DIAGNOSIS — E039 Hypothyroidism, unspecified: Secondary | ICD-10-CM | POA: Diagnosis not present

## 2015-02-11 DIAGNOSIS — I10 Essential (primary) hypertension: Secondary | ICD-10-CM | POA: Diagnosis not present

## 2015-02-26 ENCOUNTER — Encounter (HOSPITAL_COMMUNITY): Payer: Self-pay | Admitting: Emergency Medicine

## 2015-02-26 ENCOUNTER — Emergency Department (HOSPITAL_COMMUNITY): Payer: Commercial Managed Care - HMO

## 2015-02-26 DIAGNOSIS — Z7982 Long term (current) use of aspirin: Secondary | ICD-10-CM | POA: Diagnosis not present

## 2015-02-26 DIAGNOSIS — R062 Wheezing: Secondary | ICD-10-CM | POA: Diagnosis not present

## 2015-02-26 DIAGNOSIS — M1990 Unspecified osteoarthritis, unspecified site: Secondary | ICD-10-CM | POA: Diagnosis not present

## 2015-02-26 DIAGNOSIS — R05 Cough: Secondary | ICD-10-CM | POA: Diagnosis not present

## 2015-02-26 DIAGNOSIS — J45901 Unspecified asthma with (acute) exacerbation: Secondary | ICD-10-CM | POA: Insufficient documentation

## 2015-02-26 DIAGNOSIS — E785 Hyperlipidemia, unspecified: Secondary | ICD-10-CM | POA: Insufficient documentation

## 2015-02-26 DIAGNOSIS — I10 Essential (primary) hypertension: Secondary | ICD-10-CM | POA: Diagnosis not present

## 2015-02-26 DIAGNOSIS — R0602 Shortness of breath: Secondary | ICD-10-CM | POA: Diagnosis not present

## 2015-02-26 DIAGNOSIS — E119 Type 2 diabetes mellitus without complications: Secondary | ICD-10-CM | POA: Insufficient documentation

## 2015-02-26 DIAGNOSIS — M109 Gout, unspecified: Secondary | ICD-10-CM | POA: Insufficient documentation

## 2015-02-26 DIAGNOSIS — J45909 Unspecified asthma, uncomplicated: Secondary | ICD-10-CM | POA: Diagnosis present

## 2015-02-26 DIAGNOSIS — Z79899 Other long term (current) drug therapy: Secondary | ICD-10-CM | POA: Insufficient documentation

## 2015-02-26 MED ORDER — ALBUTEROL SULFATE (2.5 MG/3ML) 0.083% IN NEBU
5.0000 mg | INHALATION_SOLUTION | Freq: Once | RESPIRATORY_TRACT | Status: AC
  Administered 2015-02-26: 5 mg via RESPIRATORY_TRACT
  Filled 2015-02-26: qty 6

## 2015-02-26 NOTE — ED Notes (Signed)
Pt. reports asthma attack onset last week with dry cough unrelieved by MDI and nebulizer at home , denies fever or chills.

## 2015-02-27 ENCOUNTER — Emergency Department (HOSPITAL_COMMUNITY)
Admission: EM | Admit: 2015-02-27 | Discharge: 2015-02-27 | Disposition: A | Discharge status: Home / Self Care | Payer: Commercial Managed Care - HMO | Attending: Emergency Medicine | Admitting: Emergency Medicine

## 2015-02-27 DIAGNOSIS — E785 Hyperlipidemia, unspecified: Secondary | ICD-10-CM | POA: Diagnosis not present

## 2015-02-27 DIAGNOSIS — J45901 Unspecified asthma with (acute) exacerbation: Secondary | ICD-10-CM | POA: Diagnosis not present

## 2015-02-27 DIAGNOSIS — N183 Chronic kidney disease, stage 3 (moderate): Secondary | ICD-10-CM | POA: Diagnosis not present

## 2015-02-27 DIAGNOSIS — E119 Type 2 diabetes mellitus without complications: Secondary | ICD-10-CM | POA: Diagnosis not present

## 2015-02-27 DIAGNOSIS — M79642 Pain in left hand: Secondary | ICD-10-CM | POA: Diagnosis not present

## 2015-02-27 DIAGNOSIS — Z79899 Other long term (current) drug therapy: Secondary | ICD-10-CM | POA: Diagnosis not present

## 2015-02-27 DIAGNOSIS — E1122 Type 2 diabetes mellitus with diabetic chronic kidney disease: Secondary | ICD-10-CM | POA: Diagnosis not present

## 2015-02-27 DIAGNOSIS — M109 Gout, unspecified: Secondary | ICD-10-CM | POA: Diagnosis not present

## 2015-02-27 DIAGNOSIS — R413 Other amnesia: Secondary | ICD-10-CM | POA: Diagnosis not present

## 2015-02-27 DIAGNOSIS — I10 Essential (primary) hypertension: Secondary | ICD-10-CM | POA: Diagnosis not present

## 2015-02-27 DIAGNOSIS — J4541 Moderate persistent asthma with (acute) exacerbation: Secondary | ICD-10-CM | POA: Diagnosis not present

## 2015-02-27 DIAGNOSIS — Z7982 Long term (current) use of aspirin: Secondary | ICD-10-CM | POA: Diagnosis not present

## 2015-02-27 DIAGNOSIS — M1990 Unspecified osteoarthritis, unspecified site: Secondary | ICD-10-CM | POA: Diagnosis not present

## 2015-02-27 DIAGNOSIS — I129 Hypertensive chronic kidney disease with stage 1 through stage 4 chronic kidney disease, or unspecified chronic kidney disease: Secondary | ICD-10-CM | POA: Diagnosis not present

## 2015-02-27 LAB — CBC WITH DIFFERENTIAL/PLATELET
Basophils Absolute: 0 10*3/uL (ref 0.0–0.1)
Basophils Relative: 1 % (ref 0–1)
Eosinophils Absolute: 1.4 10*3/uL — ABNORMAL HIGH (ref 0.0–0.7)
Eosinophils Relative: 21 % — ABNORMAL HIGH (ref 0–5)
HCT: 26.3 % — ABNORMAL LOW (ref 36.0–46.0)
Hemoglobin: 8.4 g/dL — ABNORMAL LOW (ref 12.0–15.0)
Lymphocytes Relative: 25 % (ref 12–46)
Lymphs Abs: 1.7 10*3/uL (ref 0.7–4.0)
MCH: 27.9 pg (ref 26.0–34.0)
MCHC: 31.9 g/dL (ref 30.0–36.0)
MCV: 87.4 fL (ref 78.0–100.0)
Monocytes Absolute: 0.7 10*3/uL (ref 0.1–1.0)
Monocytes Relative: 11 % (ref 3–12)
Neutro Abs: 3 10*3/uL (ref 1.7–7.7)
Neutrophils Relative %: 42 % — ABNORMAL LOW (ref 43–77)
Platelets: 143 10*3/uL — ABNORMAL LOW (ref 150–400)
RBC: 3.01 MIL/uL — ABNORMAL LOW (ref 3.87–5.11)
RDW: 17.8 % — ABNORMAL HIGH (ref 11.5–15.5)
WBC: 6.9 10*3/uL (ref 4.0–10.5)

## 2015-02-27 LAB — BASIC METABOLIC PANEL
Anion gap: 9 (ref 5–15)
BUN: 11 mg/dL (ref 6–20)
CO2: 23 mmol/L (ref 22–32)
Calcium: 8.7 mg/dL — ABNORMAL LOW (ref 8.9–10.3)
Chloride: 106 mmol/L (ref 101–111)
Creatinine, Ser: 1.08 mg/dL — ABNORMAL HIGH (ref 0.44–1.00)
GFR calc Af Amer: 59 mL/min — ABNORMAL LOW (ref >60)
GFR calc non Af Amer: 51 mL/min — ABNORMAL LOW (ref >60)
Glucose, Bld: 103 mg/dL — ABNORMAL HIGH (ref 65–99)
Potassium: 4.3 mmol/L (ref 3.5–5.1)
Sodium: 138 mmol/L (ref 135–145)

## 2015-02-27 MED ORDER — PREDNISONE 20 MG PO TABS: ORAL_TABLET | ORAL | Status: DC

## 2015-02-27 MED ORDER — METHYLPREDNISOLONE SODIUM SUCC 125 MG IJ SOLR
125.0000 mg | Freq: Once | INTRAMUSCULAR | Status: AC
  Administered 2015-02-27: 125 mg via INTRAVENOUS
  Filled 2015-02-27: qty 2

## 2015-02-27 MED ORDER — ALBUTEROL (5 MG/ML) CONTINUOUS INHALATION SOLN
10.0000 mg/h | INHALATION_SOLUTION | Freq: Once | RESPIRATORY_TRACT | Status: AC
  Administered 2015-02-27: 10 mg/h via RESPIRATORY_TRACT
  Filled 2015-02-27: qty 20

## 2015-02-27 NOTE — ED Notes (Signed)
The patient reports she has a hx of asthma but was cleared by her asthma doctor so she has been off of all asthma medications. Pt has been having trouble breathing since last Thursday and has been using her inhaler with no relief. Audible wheezing noted. Lung sounds with expiratory wheezing.

## 2015-02-27 NOTE — ED Provider Notes (Signed)
CSN: [id number]     Arrival date & time 02/26/15  2121 History  This chart was scribed for Hannah Pert, MD by Meriel Pica, ED Scribe. This patient was seen in room A10C/A10C and the patient's care was started 2:28 AM.    Chief Complaint  Patient presents with  . Asthma   Patient is a 69 y.o. female presenting with asthma. The history is provided by the patient. No language interpreter was used.  Asthma This is a recurrent problem. The current episode started more than 2 days ago. The problem occurs constantly. The problem has not changed since onset.Pertinent negatives include no chest pain, no abdominal pain, no headaches and no shortness of breath. Nothing aggravates the symptoms. Nothing relieves the symptoms. Treatments tried: Nebulizer and Symbicort prescription  The treatment provided no relief.    HPI Comments: Hannah [name] is a 69 y.o. female, with a PMhx of HTN, asthma, HLD, and DM, who presents to the Emergency Department complaining of constant, moderate wheezing with an associated dry cough onset 6 days ago. She use a nebulizer and is prescribed Symbicort which she uses every 2 hours without relief. Pt states she has a history of asthma and was seeing a pulmonary doctor but he discharged her from his practice in 2010. She reports her current symptoms have presented similar to her asthma in the past. Pt denies fevers, a productive cough, CP, or vomiting.   Past Medical History  Diagnosis Date  . Hypertension   . Asthma   . Hyperlipidemia   . Arthritis   . Diabetes mellitus without complication   . Gout 12/17/2014    patient reported   History reviewed. No pertinent past surgical history. Family History  Problem Relation Age of Onset  . Heart disease Father   . Hypertension Mother   . Diabetes Father    History  Substance Use Topics  . Smoking status: Never Smoker   . Smokeless tobacco: Never Used  . Alcohol Use: No   OB History    No data available      Review of Systems  Constitutional: Negative for fever.  HENT: Negative for congestion, rhinorrhea and sore throat.   Eyes: Negative for visual disturbance.  Respiratory: Positive for cough and wheezing. Negative for shortness of breath.   Cardiovascular: Negative for chest pain and leg swelling.  Gastrointestinal: Negative for nausea, vomiting, abdominal pain and diarrhea.  Genitourinary: Negative for dysuria.  Musculoskeletal: Negative for back pain and neck pain.  Skin: Negative for rash.  Neurological: Negative for headaches.  Hematological: Does not bruise/bleed easily.  Psychiatric/Behavioral: Negative for confusion.  All other systems reviewed and are negative.   Allergies  Shellfish allergy  Home Medications   Prior to Admission medications   Medication Sig Start Date End Date Taking? Authorizing Provider  allopurinol (ZYLOPRIM) 100 MG tablet Take 100 mg by mouth daily.   Yes Historical Provider, MD  aspirin EC 81 MG tablet Take 81 mg by mouth daily.   Yes Historical Provider, MD  atorvastatin (LIPITOR) 80 MG tablet Take 80 mg by mouth daily.   Yes Historical Provider, MD  budesonide-formoterol (SYMBICORT) 160-4.5 MCG/ACT inhaler Take 2 puffs first thing in am and then another 2 puffs about 12 hours later. 08/24/12  Yes Tanda Rockers, MD  levothyroxine (SYNTHROID, LEVOTHROID) 75 MCG tablet Take 75 mcg by mouth daily.   Yes Historical Provider, MD  metFORMIN (GLUCOPHAGE) 500 MG tablet Take 500 mg by mouth 3 (three) times daily.  Yes Historical Provider, MD  naproxen (NAPROSYN) 500 MG tablet Take 500 mg by mouth daily as needed for mild pain. prn 12/27/12  Yes Historical Provider, MD  omeprazole (PRILOSEC OTC) 20 MG tablet Take 20 mg by mouth daily before breakfast.   Yes Historical Provider, MD  valsartan (DIOVAN) 160 MG tablet Take 160 mg by mouth daily.   Yes Historical Provider, MD  Vitamin D, Ergocalciferol, (DRISDOL) 50000 UNITS CAPS 1 tablet every sunday 12/30/12  Yes  Historical Provider, MD   BP 148/70 mmHg  Pulse 76  Temp(Src) 99 F (37.2 C) (Oral)  Resp 22  SpO2 99%   Physical Exam  Constitutional: She is oriented to person, place, and time. She appears well-developed and well-nourished. No distress.  HENT:  Head: Normocephalic and atraumatic.  Mouth/Throat: Oropharynx is clear and moist.  Eyes: Conjunctivae and EOM are normal. Pupils are equal, round, and reactive to light.  Neck: Normal range of motion. Neck supple. No tracheal deviation present.  Cardiovascular: Normal rate and normal heart sounds.  Exam reveals no gallop and no friction rub.   No murmur heard. Pulmonary/Chest: Breath sounds normal. No respiratory distress.  Abdominal: Soft. She exhibits no distension. There is no tenderness. There is no rebound and no guarding.  Musculoskeletal: Normal range of motion. She exhibits no edema or tenderness.  Neurological: She is alert and oriented to person, place, and time.  Skin: Skin is warm and dry.  Psychiatric: She has a normal mood and affect. Her behavior is normal.  Nursing note and vitals reviewed.  ED Course  Procedures  DIAGNOSTIC STUDIES: Oxygen Saturation is 99% on RA, normal by my interpretation.    COORDINATION OF CARE: 2:32 AM Discussed treatment plan which includes to give a longer breathing treatment and a dose of steroids with pt. Discussed unremarkable chest X- ray. Pt acknowledges and agrees to plan.   Labs Review Labs Reviewed  CBC WITH DIFFERENTIAL/PLATELET - Abnormal; Notable for the following:    RBC 3.01 (*)    Hemoglobin 8.4 (*)    HCT 26.3 (*)    RDW 17.8 (*)    Platelets 143 (*)    Neutrophils Relative % 42 (*)    Eosinophils Relative 21 (*)    Eosinophils Absolute 1.4 (*)    All other components within normal limits  BASIC METABOLIC PANEL - Abnormal; Notable for the following:    Glucose, Bld 103 (*)    Creatinine, Ser 1.08 (*)    Calcium 8.7 (*)    GFR calc non Af Amer 51 (*)    GFR calc Af  Amer 59 (*)    All other components within normal limits    Imaging Review Dg Chest 2 View  02/26/2015   CLINICAL DATA:  Acute onset of shortness of breath and cough. Initial encounter.  EXAM: CHEST  2 VIEW  COMPARISON:  Chest radiograph performed 10/18/2012  FINDINGS: The lungs are well-aerated. Mild vascular congestion is noted. There is no evidence of focal opacification, pleural effusion or pneumothorax.  The heart is borderline normal in size. No acute osseous abnormalities are seen. Mild degenerative change is noted at the mid and lower thoracic spine.  IMPRESSION: Mild vascular congestion noted, without significant pulmonary edema.   Electronically Signed   By: Garald Balding M.D.   On: 02/26/2015 22:20     EKG Interpretation   Date/Time:  Wednesday February 27 2015 02:47:07 EDT Ventricular Rate:  78 PR Interval:  194 QRS Duration: 86 QT Interval:  417 QTC Calculation: 475 R Axis:   -12 Text Interpretation:  Sinus rhythm RSR' in V1 or V2, probably normal  variant Baseline wander in lead(s) V2 Confirmed by Onix Jumper  MD, Telsa Dillavou  ([id number]) on 02/27/2015 7:26:56 AM      MDM   Final diagnoses:  Asthma exacerbation    7:27 AM 69 y.o. female with history of asthma who presents with wheezing over the last few days. She's had a dry cough. Denies any fevers. Vital signs unremarkable here. Has not had significant relief with albuterol inhaler at home. We'll give a dose of sterile and place her on continuous albuterol neb.  7:27 AM: I interpreted/reviewed the labs and/or imaging which were non-contributory.  Pt feeling much better. Still some faint wheezing heard in the right lung but none in the left. Patient has close follow-up with her PCP today. We'll start her on steroids. I have discussed the diagnosis/risks/treatment options with the patient and believe the pt to be eligible for discharge home to follow-up with her pcp as scheduled today. We also discussed returning to the ED immediately  if new or worsening sx occur. We discussed the sx which are most concerning (e.g., worsening sob, cp, fever) that necessitate immediate return. Medications administered to the patient during their visit and any new prescriptions provided to the patient are listed below.  Medications given during this visit Medications  albuterol (PROVENTIL) (2.5 MG/3ML) 0.083% nebulizer solution 5 mg (5 mg Nebulization Given 02/26/15 2139)  methylPREDNISolone sodium succinate (SOLU-MEDROL) 125 mg/2 mL injection 125 mg (125 mg Intravenous Given 02/27/15 0247)  albuterol (PROVENTIL,VENTOLIN) solution continuous neb (10 mg/hr Nebulization Given 02/27/15 0241)    Discharge Medication List as of 02/27/2015  4:59 AM    START taking these medications   Details  predniSONE (DELTASONE) 20 MG tablet Take 2 tablets by mouth on day 1 and 2. Take 1 tablet by mouth on day 3., Print         I personally performed the services described in this documentation, which was scribed in my presence. The recorded information has been reviewed and is accurate.    Hannah Pert, MD 02/27/15 [phone]

## 2015-02-27 NOTE — Discharge Instructions (Signed)

## 2015-03-04 DIAGNOSIS — J4541 Moderate persistent asthma with (acute) exacerbation: Secondary | ICD-10-CM | POA: Diagnosis not present

## 2015-03-04 DIAGNOSIS — I129 Hypertensive chronic kidney disease with stage 1 through stage 4 chronic kidney disease, or unspecified chronic kidney disease: Secondary | ICD-10-CM | POA: Diagnosis not present

## 2015-03-04 DIAGNOSIS — N183 Chronic kidney disease, stage 3 (moderate): Secondary | ICD-10-CM | POA: Diagnosis not present

## 2015-03-04 DIAGNOSIS — R062 Wheezing: Secondary | ICD-10-CM | POA: Diagnosis not present

## 2015-03-19 ENCOUNTER — Ambulatory Visit: Payer: Commercial Managed Care - HMO | Admitting: Internal Medicine

## 2015-03-20 ENCOUNTER — Ambulatory Visit (INDEPENDENT_AMBULATORY_CARE_PROVIDER_SITE_OTHER): Payer: Commercial Managed Care - HMO | Admitting: Internal Medicine

## 2015-03-20 ENCOUNTER — Encounter: Payer: Self-pay | Admitting: Internal Medicine

## 2015-03-20 VITALS — BP 160/88 | HR 74 | Ht 59.0 in | Wt 151.4 lb

## 2015-03-20 DIAGNOSIS — D649 Anemia, unspecified: Secondary | ICD-10-CM

## 2015-03-20 DIAGNOSIS — I10 Essential (primary) hypertension: Secondary | ICD-10-CM

## 2015-03-20 DIAGNOSIS — J4520 Mild intermittent asthma, uncomplicated: Secondary | ICD-10-CM | POA: Diagnosis not present

## 2015-03-20 MED ORDER — FUROSEMIDE 20 MG PO TABS: 20.0000 mg | ORAL_TABLET | Freq: Every day | ORAL | Status: DC

## 2015-03-20 NOTE — Assessment & Plan Note (Addendum)
-   PFT's 10/18/2012 FEV1  1.63 (99%) ratio 70 and no better p B2, DLCO 85 corrects to 124   DDX of  difficult airways management all start with A and  include Adherence, Ace Inhibitors, Acid Reflux, Active Sinus Disease, Alpha 1 Antitripsin deficiency, Anxiety masquerading as Airways dz,  ABPA,  allergy(esp in young), Aspiration (esp in elderly), Adverse effects of meds,  Active smokers, A bunch of PE's (a small clot burden can't cause this syndrome unless there is already severe underlying pulm or vascular dz with poor reserve) plus two Bs  = Bronchiectasis and Beta blocker use..and one C= CHF  Adherence is always the initial "prime suspect" and is a multilayered concern that requires a "trust but verify" approach in every patient - starting with knowing how to use medications, especially inhalers, correctly, keeping up with refills and understanding the fundamental difference between maintenance and prns vs those medications only taken for a very short course and then stopped and not refilled.  - The proper method of use, as well as anticipated side effects, of a metered-dose inhaler are discussed and demonstrated to the patient. Improved effectiveness after extensive coaching during this visit to a level of approximately  75% > try restarting symbicort 160 2bid  ? Acid (or non-acid) GERD > always difficult to exclude as up to 75% of pts in some series report no assoc GI/ Heartburn symptoms> rec max (24h)  acid suppression and diet restrictions/ reviewed and instructions given in writing.  ? Allergy > note 02/27/15 cbc with 21% eos> Prednisone 10 mg take  4 each am x 2 days,   2 each am x 2 days,  1 each am x 2 days and stop Needs f/u in 2 weeks  ? chf from hbp (see hbp) > add lasix 20 mg daily   I had an extended discussion with the patient reviewing all relevant studies completed to date and  lasting  36 m Each maintenance medication was reviewed in detail including most importantly the difference  between maintenance and as needed and under what circumstances the prns are to be used.  Please see instructions for details which were reviewed in writing and the patient given a copy.

## 2015-03-20 NOTE — Assessment & Plan Note (Addendum)
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

## 2015-03-20 NOTE — Progress Notes (Signed)
Subjective:     Patient ID: Hannah [name], female   DOB: [birth year], 69 y.o.   MRN: [id number]  HPI   Review of Systems     Objective:   Physical Exam     Assessment:       Subjective:    Patient ID: Hannah [name], female    DOB: May 03, 1946  MRN: [id number]     Brief patient profile:  73 yobf never smoker with asthma though may have had it as child per mom but didn't interfere with activities until in 44s and moved to [facility] august 2013 and referred 06/17/2012 to pulmonary clinic after decades of care in [place] by a pulmonary doctor on maint theoph and prednisone.   History of Present Illness  06/17/2012 1st pulmonary pulmonary eval cc chronic asthma on prednisone daily x years but still very saba dependent. Last er eval one year prior to [place]. Using saba neb tid despite multiple maint rx.  rec Stop theophylline and fosfamax and advair  Try prilosec 20mg   Take 30-60 min before first meal of the day and Pepcid 20 mg one bedtime   Try symbicort 160 Take 2 puffs first thing in am and then another 2 puffs about 12 hours later.  Work on inhaler technique GERD   07/01/2012 f/u ov/Hannah [name] cc sob better,  Prednisone 10 mg one half daily, ventolin use is down, less neb, ran out of accolate x one week no worse symptoms, no cough. rec Prednisone 10 mg one half every other day (even days) unless more trouble breathing or needing more rescue Plan A Symbicort 160 Take 2 puffs first thing in am and then another 2 puffs about 12 hours later no matter Plan B ventolin use it only if you can't catch your breath Plan C Albuterol nebulizer use only after you've tried plan B Plan D Call Doctor Plan E > ER    07/29/2012 f/u ov/Hannah [name] cc sob much better @ taper down Prednisone 10 mg one half each am and symbicort 160 2bid and no needed saba at all.  rec Work on inhaler technique:   Continue symbicort 160 Take 2 puffs first thing in am and then another 2 puffs about 12 hours later.   Only use ventolin if needed Prednisone 10 mg one half every other day x 2 weeks, then one half third day x 2 weeks and stop and if get more short of breath or queezy or nauseated resume the previous dose.   10/18/2012 f/u ov/Hannah [name] cc no sob, no cough, stopped symbicort on her own (not the instructions given) but no need for it nor problem coming off prednisone which is helping her lose  wt  rec  Ok to just use symbicort when you're having problems at a dose of 2 puffs every 12 hours   01/16/2013 f/u ov/Hannah [name] f/u abn cxr with [place] hernia on CT confirmed 07/08/11  Plus ? LAM Chief Complaint  Patient presents with  . Office Visit    Pt. reports that her breathing is doing good.  Pt. denies any sob, wheezing and no chest tightness.  Still on acid suppression and not sure she needs it but no sob or need for daytime saba and rarely even using symbicort at this point rec Try on pepcid 20 mg 2x daily for two weeks then taper off from there  Stop protonix for now, restart if reflux worse GERD diet  03/20/2015 consult/ Hannah [name] re: recurrent cough and sob  Chief Complaint  Patient presents with  . Pulmonary Consult    Pt last seen April 2014.  She went to ED on 02/27/15 with SOB- was dxed with Asthma flare and was given neb txs and pred taper. Since then her breathing has improved, but not back to her normal baseline. She c/o minimal cough and wheezing at night- bringing up min clear sputum.     Had weaned off all rec meds x 2 years and did just fine then acute onset cough late may 2016 then wheeze/ unable to lie flat then restarted neb and did some better but still not back to baseline, esp with noct cough and wheeze/ had been on gerd rx in past but not since onset of cough as no overt hb   No obvious day to day or daytime variabilty or assoc excess or purulent sputum  or cp or chest tightness, overt sinus or hb symptoms. No unusual exp hx or h/o childhood pna/ asthma or  knowledge of premature birth.   Also denies any obvious fluctuation of symptoms with weather or environmental changes or other aggravating or alleviating factors except as outlined above   Current Medications, Allergies, Complete Past Medical History, Past Surgical History, Family History, and Social History were reviewed in [employer] record.  ROS  The following are not active complaints unless bolded sore throat, dysphagia, dental problems, itching, sneezing,  nasal congestion or excess/ purulent secretions, ear ache,   fever, chills, sweats, unintended wt loss, pleuritic or exertional cp, hemoptysis,  orthopnea pnd or leg swelling, presyncope, palpitations, abdominal pain, anorexia, nausea, vomiting, diarrhea  or change in bowel or urinary habits, change in stools or urine, dysuria,hematuria,  rash, arthralgias, visual complaints, headache, numbness weakness or ataxia or problems with walking or coordination,  change in mood/affect or memory.              Objective:    Physical Exam  Wt 07/29/2012  188 vs 176 10/18/2012 > 01/16/2013  170 > 03/20/2015  151  Wt Readings from Last 3 Encounters:  07/01/12 192 lb 9.6 oz (87.363 kg)  06/17/12 195 lb 9.6 oz (88.724 kg)     vital signs reviewed/ note hbp  Mod Obese amb bf    HEENT: nl dentition, turbinates, and orophanx. Nl external ear canals without cough reflex   NECK :  without JVD/Nodes/TM/ nl carotid upstrokes bilaterally   LUNGS: no acc muscle use, clear to A and P bilaterally without cough on insp or exp maneuvers   CV:  RRR  no s3 or murmur or increase in P2, no edema   ABD:  soft and nontender with nl excursion in the supine position. No bruits or organomegaly, bowel sounds nl  MS:  warm without deformities, calf tenderness, cyanosis or clubbing        I personally reviewed images and agree with radiology impression as follows:  CXR:  02/26/15 Mild vascular congestion noted, without  significant pulmonary edema.      Labs reviewed Lab Results  Component Value Date   WBC 6.9 02/27/2015   HGB 8.4* 02/27/2015   HCT 26.3* 02/27/2015   MCV 87.4 02/27/2015   PLT 143* 02/27/2015   Cbc 02/27/15 with 21% eos - 1.4 absolute      Assessment & Plan:

## 2015-03-20 NOTE — Patient Instructions (Addendum)
Any time you are coughing > Try prilosec 20mg   Take 30-60 min before first meal of the day and Pepcid (famotidine) 20 mg one bedtime until cough is completely gone for at least a week without the need for cough suppression      Any time breathing / wheezing issues  Plan A symbicort 160 Take 2 puffs first thing in am and then another 2 puffs about 12 hours later.   Work on inhaler technique:  relax and gently blow all the way out then take a nice smooth deep breath back in, triggering the inhaler at same time you start breathing in.  Hold for up to 5 seconds if you can.  Rinse and gargle with water when done        Plan B = back up  Only use your albuterol (proair/ventolin) as a rescue medication to be used if you can't catch your breath by resting or doing a relaxed purse lip breathing pattern.  - The less you use it, the better it will work when you need it. - Ok to use up to 2 puffs  every 4 hours if you must but call for immediate appointment if use goes up over your usual need - Don't leave home without it !!  (think of it like the spare tire for your car)   Plan C = Crisis  Only use the nebulizer if you try plan B and it doesn't work but definitely call for appt asap  GERD (REFLUX)  is an extremely common cause of respiratory symptoms just like yours , many times with no obvious heartburn at all.    It can be treated with medication, but also with lifestyle changes including elevation of the head of your bed (ideally with 6 inch  bed blocks),  Smoking cessation, avoidance of late meals, excessive alcohol, and avoid fatty foods, chocolate, peppermint, colas, red wine, and acidic juices such as orange juice.  NO MINT OR MENTHOL PRODUCTS SO NO COUGH DROPS  USE SUGARLESS CANDY INSTEAD (Jolley ranchers or Stover's or Life Savers) or even ice chips will also do - the key is to swallow to prevent all throat clearing. NO OIL BASED VITAMINS - use powdered substitutes.  Late add:  Needs f/u 2  week see how she's doing and repeat cbc with diff/ allergy profile

## 2015-03-21 ENCOUNTER — Telehealth: Payer: Self-pay | Admitting: *Deleted

## 2015-03-21 DIAGNOSIS — M109 Gout, unspecified: Secondary | ICD-10-CM | POA: Diagnosis not present

## 2015-03-21 DIAGNOSIS — D649 Anemia, unspecified: Secondary | ICD-10-CM | POA: Insufficient documentation

## 2015-03-21 DIAGNOSIS — N183 Chronic kidney disease, stage 3 (moderate): Secondary | ICD-10-CM | POA: Diagnosis not present

## 2015-03-21 DIAGNOSIS — I129 Hypertensive chronic kidney disease with stage 1 through stage 4 chronic kidney disease, or unspecified chronic kidney disease: Secondary | ICD-10-CM | POA: Diagnosis not present

## 2015-03-21 NOTE — Telephone Encounter (Signed)
-----   Message from Tanda Rockers, MD sent at 03/20/2015  8:15 PM EDT ----- Let her know p I reviewed her cxr and records her bp may be causing sob so added lasix 20 mg daily over internet

## 2015-03-21 NOTE — Telephone Encounter (Signed)
Appt scheduled. Does pt needs repeat labs now or when he comes in for OV? thanks

## 2015-03-21 NOTE — Assessment & Plan Note (Signed)
No h/o blood loss> needs f/u at 2 weeks (will recheck here as f/u needed anyway re eosinophilia)

## 2015-03-21 NOTE — Telephone Encounter (Signed)
Spoke with the pt and notified of recs per MW  She verbalized understanding and denied any questions 

## 2015-03-21 NOTE — Telephone Encounter (Signed)
Made 2 week ROV appt for 07/08 at 4:00 pm.

## 2015-03-21 NOTE — Telephone Encounter (Signed)
LMTCB

## 2015-03-21 NOTE — Telephone Encounter (Signed)
When comes in for ov  Thanks

## 2015-03-21 NOTE — Telephone Encounter (Signed)
-----   Message from Tanda Rockers, MD sent at 03/21/2015  9:00 AM EDT ----- Needs f/u 2 week see how she's doing and repeat cbc with diff/ allergy profile

## 2015-04-05 ENCOUNTER — Encounter: Payer: Self-pay | Admitting: Internal Medicine

## 2015-04-05 ENCOUNTER — Other Ambulatory Visit (INDEPENDENT_AMBULATORY_CARE_PROVIDER_SITE_OTHER): Payer: Commercial Managed Care - HMO

## 2015-04-05 ENCOUNTER — Ambulatory Visit (INDEPENDENT_AMBULATORY_CARE_PROVIDER_SITE_OTHER): Payer: Commercial Managed Care - HMO | Admitting: Internal Medicine

## 2015-04-05 VITALS — BP 140/80 | HR 74 | Ht 59.5 in | Wt 147.0 lb

## 2015-04-05 DIAGNOSIS — J4530 Mild persistent asthma, uncomplicated: Secondary | ICD-10-CM

## 2015-04-05 DIAGNOSIS — D649 Anemia, unspecified: Secondary | ICD-10-CM | POA: Diagnosis not present

## 2015-04-05 DIAGNOSIS — J45909 Unspecified asthma, uncomplicated: Secondary | ICD-10-CM | POA: Diagnosis not present

## 2015-04-05 LAB — CBC WITH DIFFERENTIAL/PLATELET
Basophils Absolute: 0.1 10*3/uL (ref 0.0–0.1)
Basophils Relative: 0.8 % (ref 0.0–3.0)
Eosinophils Absolute: 0.8 10*3/uL — ABNORMAL HIGH (ref 0.0–0.7)
Eosinophils Relative: 12.4 % — ABNORMAL HIGH (ref 0.0–5.0)
HCT: 28.4 % — ABNORMAL LOW (ref 36.0–46.0)
Hemoglobin: 9.2 g/dL — ABNORMAL LOW (ref 12.0–15.0)
Lymphocytes Relative: 21.4 % (ref 12.0–46.0)
Lymphs Abs: 1.4 10*3/uL (ref 0.7–4.0)
MCHC: 32.3 g/dL (ref 30.0–36.0)
MCV: 87.1 fl (ref 78.0–100.0)
Monocytes Absolute: 0.6 10*3/uL (ref 0.1–1.0)
Monocytes Relative: 9.6 % (ref 3.0–12.0)
Neutro Abs: 3.7 10*3/uL (ref 1.4–7.7)
Neutrophils Relative %: 55.8 % (ref 43.0–77.0)
Platelets: 171 10*3/uL (ref 150.0–400.0)
RBC: 3.26 Mil/uL — ABNORMAL LOW (ref 3.87–5.11)
RDW: 20.7 % — ABNORMAL HIGH (ref 11.5–15.5)
WBC: 6.6 10*3/uL (ref 4.0–10.5)

## 2015-04-05 MED ORDER — BUDESONIDE-FORMOTEROL FUMARATE 160-4.5 MCG/ACT IN AERO: INHALATION_SPRAY | RESPIRATORY_TRACT | Status: DC

## 2015-04-05 NOTE — Progress Notes (Signed)
Subjective:     Patient ID: Hannah [name], female   DOB: Jul 04, 1946, 69 y.o.   MRN: [id number]  HPI   Review of Systems     Objective:   Physical Exam     Assessment:       Subjective:    Patient ID: Hannah [name], female    DOB: 1945/10/12  MRN: [id number]     Brief patient profile:  46 yobf never smoker with asthma though may have had it as child per mom but didn't interfere with activities until in 73s and moved to [facility] august 2013 and referred 06/17/2012 to pulmonary clinic after decades of care in [place] by a pulmonary doctor on maint theoph and prednisone.   History of Present Illness  06/17/2012 1st pulmonary pulmonary eval cc chronic asthma on prednisone daily x years but still very saba dependent. Last er eval one year prior to [place]. Using saba neb tid despite multiple maint rx.  rec Stop theophylline and fosfamax and advair  Try prilosec 20mg   Take 30-60 min before first meal of the day and Pepcid 20 mg one bedtime   Try symbicort 160 Take 2 puffs first thing in am and then another 2 puffs about 12 hours later.  Work on inhaler technique GERD   07/01/2012 f/u ov/Hannah [name] cc sob better,  Prednisone 10 mg one half daily, ventolin use is down, less neb, ran out of accolate x one week no worse symptoms, no cough. rec Prednisone 10 mg one half every other day (even days) unless more trouble breathing or needing more rescue Plan A Symbicort 160 Take 2 puffs first thing in am and then another 2 puffs about 12 hours later no matter Plan B ventolin use it only if you can't catch your breath Plan C Albuterol nebulizer use only after you've tried plan B Plan D Call Doctor Plan E > ER       10/18/2012 f/u ov/Hannah [name] cc no sob, no cough, stopped symbicort on her own (not the instructions given) but no need for it nor problem coming off prednisone which is helping her lose  wt  rec  Ok to just use symbicort when you're having problems at a dose of 2 puffs every 12  hours     03/20/2015 consult/ Dr Glendale Chard Melvyn Novas re: recurrent cough and sob  Chief Complaint  Patient presents with  . Pulmonary Consult    Pt last seen April 2014.  She went to ED on 02/27/15 with SOB- was dxed with Asthma flare and was given neb txs and pred taper. Since then her breathing has improved, but not back to her normal baseline. She c/o minimal cough and wheezing at night- bringing up min clear sputum.     Had weaned off all rec meds x 2 years and did just fine then acute onset cough late may 2016 then wheeze/ unable to lie flat then restarted neb and did some better but still not back to baseline, esp with noct cough and wheeze/ had been on gerd rx in past but not since onset of cough as no overt hb Rec Any time you are coughing > Try prilosec 20mg   Take 30-60 min before first meal of the day and Pepcid (famotidine) 20 mg one bedtime until cough is completely gone for at least a week without the need for cough suppression Any time breathing / wheezing issues  Plan A symbicort 160 Take 2 puffs first thing in am and then another 2  puffs about 12 hours later.  Work on inhaler technique:  relax and gently blow all the way out then take a nice smooth deep breath back in, triggering the inhaler at same time you start breathing in.  Hold for up to 5 seconds if you can.  Rinse and gargle with water when done  Plan B = back up  Only use your albuterol (proair/ventolin) as a rescue medication  Plan C = Crisis  Only use the nebulizer if you try plan B and it doesn't work but definitely call for appt asap GERD diet      04/05/2015 f/u ov/Hannah [name] re:  Chief Complaint  Patient presents with  . Follow-up    Pt states that her breathing is doing well today. Her cough has imrproved. She has not had to use rescue inhaler and uses symbicort prn.   symbicort one qod / prilsoec /pepcid qs Prev allergy eval in [place] no better on shots  X one year   No obvious day to day or daytime variabilty  or assoc excess or purulent sputum  or cp or chest tightness, overt sinus or hb symptoms. No unusual exp hx or h/o childhood pna/ asthma or knowledge of premature birth.   Also denies any obvious fluctuation of symptoms with weather or environmental changes or other aggravating or alleviating factors except as outlined above   Current Medications, Allergies, Complete Past Medical History, Past Surgical History, Family History, and Social History were reviewed in [employer] record.  ROS  The following are not active complaints unless bolded sore throat, dysphagia, dental problems, itching, sneezing,  nasal congestion or excess/ purulent secretions, ear ache,   fever, chills, sweats, unintended wt loss, pleuritic or exertional cp, hemoptysis,  orthopnea pnd or leg swelling, presyncope, palpitations, abdominal pain, anorexia, nausea, vomiting, diarrhea  or change in bowel or urinary habits, change in stools or urine, dysuria,hematuria,  rash, arthralgias, visual complaints, headache, numbness weakness or ataxia or problems with walking or coordination,  change in mood/affect or memory.              Objective:    Physical Exam  Wt 07/29/2012  188 vs 176 10/18/2012 > 01/16/2013  170 > 03/20/2015  151 > 7/7/816  147  Wt Readings from Last 3 Encounters:  07/01/12 192 lb 9.6 oz (87.363 kg)  06/17/12 195 lb 9.6 oz (88.724 kg)     vital signs reviewed   Mod Obese amb bf    HEENT: nl dentition, turbinates, and orophanx. Nl external ear canals without cough reflex   NECK :  without JVD/Nodes/TM/ nl carotid upstrokes bilaterally   LUNGS: no acc muscle use, clear to A and P bilaterally without cough on insp or exp maneuvers   CV:  RRR  no s3 or murmur or increase in P2, no edema   ABD:  soft and nontender with nl excursion in the supine position. No bruits or organomegaly, bowel sounds nl  MS:  warm without deformities, calf tenderness, cyanosis or clubbing         I personally reviewed images and agree with radiology impression as follows:  CXR:  02/26/15 Mild vascular congestion noted, without significant pulmonary edema.      Labs ordered/ reviewed:    Lab 04/05/15 1708  HGB 9.2*  HCT 28.4*  WBC 6.6  PLT 171.0  Eos 12% MCV nl     Assessment & Plan:       Outpatient Encounter Prescriptions as of 04/05/2015  Medication Sig  . albuterol (VENTOLIN HFA) 108 (90 BASE) MCG/ACT inhaler Inhale 2 puffs into the lungs every 6 (six) hours as needed for wheezing or shortness of breath.  . allopurinol (ZYLOPRIM) 100 MG tablet Take 100 mg by mouth daily.  [name] aspirin EC 81 MG tablet Take 81 mg by mouth daily.  [name] atorvastatin (LIPITOR) 80 MG tablet Take 80 mg by mouth daily.  . budesonide-formoterol (SYMBICORT) 160-4.5 MCG/ACT inhaler Take 2 puffs first thing in am and then another 2 puffs about 12 hours later.  . Cholecalciferol (VITAMIN D PO) Take 1 capsule by mouth daily.  . famotidine (PEPCID) 20 MG tablet Take 20 mg by mouth at bedtime.  . furosemide (LASIX) 20 MG tablet Take 1 tablet (20 mg total) by mouth daily.  [name] ipratropium-albuterol (DUONEB) 0.5-2.5 (3) MG/3ML SOLN Take 3 mLs by nebulization every 6 (six) hours as needed.  [name] levothyroxine (SYNTHROID, LEVOTHROID) 75 MCG tablet Take 75 mcg by mouth daily.  . metFORMIN (GLUCOPHAGE) 500 MG tablet Take 500 mg by mouth 3 (three) times daily.   [name] omeprazole (PRILOSEC) 20 MG capsule Take 20 mg by mouth daily.  . valsartan (DIOVAN) 160 MG tablet Take 320 mg by mouth daily.   . [DISCONTINUED] budesonide-formoterol (SYMBICORT) 160-4.5 MCG/ACT inhaler Take 2 puffs first thing in am and then another 2 puffs about 12 hours later.   No facility-administered encounter medications on file as of 04/05/2015.

## 2015-04-05 NOTE — Patient Instructions (Signed)
Any time you are coughing > Try prilosec 20mg   Take 30-60 min before first meal of the day and Pepcid (famotidine) 20 mg one bedtime until cough is completely gone for at least a week without the need for cough suppression  Any time breathing / wheezing issues  Plan A symbicort 160 Take 2 puffs first thing in am and then another 2 puffs about 12 hours later.   Work on inhaler technique:  relax and gently blow all the way out then take a nice smooth deep breath back in, triggering the inhaler at same time you start breathing in.  Hold for up to 5 seconds if you can.  Rinse and gargle with water when done  Plan B = back up  Only use your albuterol (proair/ventolin) as a rescue medication to be used if you can't catch your breath by resting or doing a relaxed purse lip breathing pattern.  - The less you use it, the better it will work when you need it. - Ok to use up to 2 puffs  every 4 hours if you must but call for immediate appointment if use goes up over your usual need - Don't leave home without it !!  (think of it like the spare tire for your car)   Plan C = Crisis  Only use the nebulizer if you try plan B and it doesn't work but definitely call for appt asap  GERD (REFLUX)  is an extremely common cause of respiratory symptoms just like yours , many times with no obvious heartburn at all.    It can be treated with medication, but also with lifestyle changes including elevation of the head of your bed (ideally with 6 inch  bed blocks),  Smoking cessation, avoidance of late meals, excessive alcohol, and avoid fatty foods, chocolate, peppermint, colas, red wine, and acidic juices such as orange juice.  NO MINT OR MENTHOL PRODUCTS SO NO COUGH DROPS  USE SUGARLESS CANDY INSTEAD (Jolley ranchers or Stover's or Life Savers) or even ice chips will also do - the key is to swallow to prevent all throat clearing. NO OIL BASED VITAMINS - use powdered substitutes.  Please remember to go to the lab   department downstairs for your tests - we will call you with the results when they are available.      If you are satisfied with your treatment plan,  let your doctor know and he/she can either refill your medications or you can return here when your prescription runs out.     If in any way you are not 100% satisfied,  please tell us.  If 100% better, tell your friends!  Pulmonary follow up is as needed

## 2015-04-06 ENCOUNTER — Encounter: Payer: Self-pay | Admitting: Internal Medicine

## 2015-04-06 NOTE — Assessment & Plan Note (Signed)
Followed in Pulmonary clinic/ [facility]/ Ralphael Southgate - 03/22/2015   Walked RA x 2 laps @ 163ft each  stopped due to sat 83% at a nl pace, mild sob    - spirometry 03/22/2015 >>>  FEV1  1.02 (42%)  Ratio 62 not sure authentic - CTa chest    03/26/2015 > neg pe/ mild copd changes/ neg venous dopplers bilaterally  - Trial of spiriva respimat for copd (see copd)  I had an extended final summary discussion with the patient reviewing all relevant studies completed to date and  lasting 15 to 20 minutes of a 25 minute visit on the following issues:    1) needs to be better at using saba esp if not willing to take symbicort on a maint basis (prev stopped though for over a year s flare so hard to argue with past strategy - my fear is she'll forget how to use it when she really needs it  2) Eos better, correlate usually with sinus symptoms than asthma but she denies at present - will check IgE for ABPA  3) Each maintenance medication was reviewed in detail including most importantly the difference between maintenance and as needed and under what circumstances the prns are to be used/ action plan format.  Please see instructions for details which were reviewed in writing and the patient given a copy.

## 2015-04-06 NOTE — Assessment & Plan Note (Signed)
Persistent pattern of moderate anemia with nl indices typical of anemia of chronic dz > f/u rec by primary care/ ? Heme consult at Dr Baird Cancer' discretion.

## 2015-04-08 LAB — ALLERGY FULL PROFILE
Allergen, D pternoyssinus,d7: <0.1 kU/L
Allergen,Goose feathers, e70: <0.1 kU/L
Alternaria Alternata: <0.1 kU/L
Aspergillus fumigatus, m3: <0.1 kU/L
Bahia Grass: <0.1 kU/L
Bermuda Grass: <0.1 kU/L
Box Elder IgE: <0.1 kU/L
Candida Albicans: <0.1 kU/L
Cat Dander: <0.1 kU/L
Common Ragweed: <0.1 kU/L
Curvularia lunata: <0.1 kU/L
D. farinae: <0.1 kU/L
Dog Dander: <0.1 kU/L
Elm IgE: <0.1 kU/L
Fescue: <0.1 kU/L
G005 Rye, Perennial: <0.1 kU/L
G009 Red Top: <0.1 kU/L
Goldenrod: <0.1 kU/L
Helminthosporium halodes: <0.1 kU/L
House Dust Hollister: <0.1 kU/L
IgE (Immunoglobulin E), Serum: 15 kU/L (ref <115)
Lamb's Quarters: <0.1 kU/L
Oak: <0.1 kU/L
Plantain: <0.1 kU/L
Stemphylium Botryosum: <0.1 kU/L
Sycamore Tree: <0.1 kU/L
Timothy Grass: <0.1 kU/L

## 2015-04-08 NOTE — Progress Notes (Signed)
Quick Note:  Spoke with pt and notified of results per Dr. Wert. Pt verbalized understanding and denied any questions.,  ______ 

## 2015-04-08 NOTE — Progress Notes (Signed)
Quick Note:  Spoke with pt and notified of results per Dr. Wert. Pt verbalized understanding and denied any questions.  ______ 

## 2015-04-12 DIAGNOSIS — Z79899 Other long term (current) drug therapy: Secondary | ICD-10-CM | POA: Diagnosis not present

## 2015-04-12 DIAGNOSIS — I129 Hypertensive chronic kidney disease with stage 1 through stage 4 chronic kidney disease, or unspecified chronic kidney disease: Secondary | ICD-10-CM | POA: Diagnosis not present

## 2015-04-12 DIAGNOSIS — N183 Chronic kidney disease, stage 3 (moderate): Secondary | ICD-10-CM | POA: Diagnosis not present

## 2015-04-12 DIAGNOSIS — N952 Postmenopausal atrophic vaginitis: Secondary | ICD-10-CM | POA: Diagnosis not present

## 2015-04-12 DIAGNOSIS — K219 Gastro-esophageal reflux disease without esophagitis: Secondary | ICD-10-CM | POA: Diagnosis not present

## 2015-04-17 DIAGNOSIS — E039 Hypothyroidism, unspecified: Secondary | ICD-10-CM | POA: Diagnosis not present

## 2015-05-28 DIAGNOSIS — M25562 Pain in left knee: Secondary | ICD-10-CM | POA: Diagnosis not present

## 2015-05-28 DIAGNOSIS — M19041 Primary osteoarthritis, right hand: Secondary | ICD-10-CM | POA: Diagnosis not present

## 2015-05-28 DIAGNOSIS — R6889 Other general symptoms and signs: Secondary | ICD-10-CM | POA: Diagnosis not present

## 2015-05-28 DIAGNOSIS — M25561 Pain in right knee: Secondary | ICD-10-CM | POA: Diagnosis not present

## 2015-05-28 DIAGNOSIS — M79642 Pain in left hand: Secondary | ICD-10-CM | POA: Diagnosis not present

## 2015-05-28 DIAGNOSIS — M79641 Pain in right hand: Secondary | ICD-10-CM | POA: Diagnosis not present

## 2015-05-30 DIAGNOSIS — Z01419 Encounter for gynecological examination (general) (routine) without abnormal findings: Secondary | ICD-10-CM | POA: Diagnosis not present

## 2015-06-05 DIAGNOSIS — M2550 Pain in unspecified joint: Secondary | ICD-10-CM | POA: Diagnosis not present

## 2015-06-05 DIAGNOSIS — M19042 Primary osteoarthritis, left hand: Secondary | ICD-10-CM | POA: Diagnosis not present

## 2015-06-05 DIAGNOSIS — R5381 Other malaise: Secondary | ICD-10-CM | POA: Diagnosis not present

## 2015-06-05 DIAGNOSIS — M19041 Primary osteoarthritis, right hand: Secondary | ICD-10-CM | POA: Diagnosis not present

## 2015-06-05 DIAGNOSIS — Z79899 Other long term (current) drug therapy: Secondary | ICD-10-CM | POA: Diagnosis not present

## 2015-06-05 DIAGNOSIS — R300 Dysuria: Secondary | ICD-10-CM | POA: Diagnosis not present

## 2015-06-26 DIAGNOSIS — I129 Hypertensive chronic kidney disease with stage 1 through stage 4 chronic kidney disease, or unspecified chronic kidney disease: Secondary | ICD-10-CM | POA: Diagnosis not present

## 2015-06-26 DIAGNOSIS — E1122 Type 2 diabetes mellitus with diabetic chronic kidney disease: Secondary | ICD-10-CM | POA: Diagnosis not present

## 2015-06-26 DIAGNOSIS — E039 Hypothyroidism, unspecified: Secondary | ICD-10-CM | POA: Diagnosis not present

## 2015-06-26 DIAGNOSIS — N183 Chronic kidney disease, stage 3 (moderate): Secondary | ICD-10-CM | POA: Diagnosis not present

## 2015-06-26 DIAGNOSIS — N08 Glomerular disorders in diseases classified elsewhere: Secondary | ICD-10-CM | POA: Diagnosis not present

## 2015-06-27 ENCOUNTER — Telehealth: Payer: Self-pay | Admitting: Hematology

## 2015-06-27 NOTE — Telephone Encounter (Signed)
new patient appt-s/w patient and gave np appt for 10/04 @ 3:15 w/Dr. Irene Limbo.  Referring Dr. Estanislado Pandy Dx- Abnormal SPEP, joint pain  Referral information scanned

## 2015-06-28 DIAGNOSIS — M329 Systemic lupus erythematosus, unspecified: Secondary | ICD-10-CM | POA: Diagnosis not present

## 2015-06-28 DIAGNOSIS — Z79899 Other long term (current) drug therapy: Secondary | ICD-10-CM | POA: Diagnosis not present

## 2015-06-28 DIAGNOSIS — M0579 Rheumatoid arthritis with rheumatoid factor of multiple sites without organ or systems involvement: Secondary | ICD-10-CM | POA: Diagnosis not present

## 2015-06-28 DIAGNOSIS — M79641 Pain in right hand: Secondary | ICD-10-CM | POA: Diagnosis not present

## 2015-07-02 ENCOUNTER — Telehealth: Payer: Self-pay | Admitting: Hematology

## 2015-07-02 ENCOUNTER — Ambulatory Visit: Payer: Commercial Managed Care - HMO

## 2015-07-02 ENCOUNTER — Ambulatory Visit (HOSPITAL_BASED_OUTPATIENT_CLINIC_OR_DEPARTMENT_OTHER): Payer: Commercial Managed Care - HMO | Admitting: Hematology

## 2015-07-02 ENCOUNTER — Encounter: Payer: Self-pay | Admitting: Hematology

## 2015-07-02 ENCOUNTER — Other Ambulatory Visit: Payer: Commercial Managed Care - HMO

## 2015-07-02 VITALS — BP 140/64 | HR 64 | Temp 97.4°F | Resp 20 | Ht 59.5 in | Wt 148.1 lb

## 2015-07-02 DIAGNOSIS — D649 Anemia, unspecified: Secondary | ICD-10-CM

## 2015-07-02 DIAGNOSIS — M069 Rheumatoid arthritis, unspecified: Secondary | ICD-10-CM | POA: Diagnosis not present

## 2015-07-02 DIAGNOSIS — D472 Monoclonal gammopathy: Secondary | ICD-10-CM | POA: Diagnosis not present

## 2015-07-02 DIAGNOSIS — M799 Soft tissue disorder, unspecified: Secondary | ICD-10-CM

## 2015-07-02 LAB — COMPREHENSIVE METABOLIC PANEL (CC13)
ALT: 25 U/L (ref 0–55)
AST: 31 U/L (ref 5–34)
Albumin: 3.7 g/dL (ref 3.5–5.0)
Alkaline Phosphatase: 71 U/L (ref 40–150)
Anion Gap: 8 mEq/L (ref 3–11)
BUN: 15.5 mg/dL (ref 7.0–26.0)
CO2: 26 mEq/L (ref 22–29)
Calcium: 9.3 mg/dL (ref 8.4–10.4)
Chloride: 107 mEq/L (ref 98–109)
Creatinine: 1.2 mg/dL — ABNORMAL HIGH (ref 0.6–1.1)
EGFR: 56 mL/min/{1.73_m2} — ABNORMAL LOW (ref >90)
Glucose: 79 mg/dl (ref 70–140)
Potassium: 3.9 mEq/L (ref 3.5–5.1)
Sodium: 141 mEq/L (ref 136–145)
Total Bilirubin: 0.68 mg/dL (ref 0.20–1.20)
Total Protein: 7.2 g/dL (ref 6.4–8.3)

## 2015-07-02 LAB — CBC & DIFF AND RETIC
BASO%: 0.9 % (ref 0.0–2.0)
Basophils Absolute: 0.1 10*3/uL (ref 0.0–0.1)
EOS%: 11.6 % — ABNORMAL HIGH (ref 0.0–7.0)
Eosinophils Absolute: 0.7 10*3/uL — ABNORMAL HIGH (ref 0.0–0.5)
HCT: 28.8 % — ABNORMAL LOW (ref 34.8–46.6)
HGB: 9.3 g/dL — ABNORMAL LOW (ref 11.6–15.9)
Immature Retic Fract: 8.8 % (ref 1.60–10.00)
LYMPH%: 27.2 % (ref 14.0–49.7)
MCH: 29.7 pg (ref 25.1–34.0)
MCHC: 32.3 g/dL (ref 31.5–36.0)
MCV: 92 fL (ref 79.5–101.0)
MONO#: 0.6 10*3/uL (ref 0.1–0.9)
MONO%: 11.4 % (ref 0.0–14.0)
NEUT#: 2.7 10*3/uL (ref 1.5–6.5)
NEUT%: 48.9 % (ref 38.4–76.8)
Platelets: 137 10*3/uL — ABNORMAL LOW (ref 145–400)
RBC: 3.13 10*6/uL — ABNORMAL LOW (ref 3.70–5.45)
RDW: 19.6 % — ABNORMAL HIGH (ref 11.2–14.5)
Retic %: 2.29 % — ABNORMAL HIGH (ref 0.70–2.10)
Retic Ct Abs: 71.68 10*3/uL (ref 33.70–90.70)
WBC: 5.6 10*3/uL (ref 3.9–10.3)
lymph#: 1.5 10*3/uL (ref 0.9–3.3)

## 2015-07-02 LAB — CHCC SMEAR

## 2015-07-02 NOTE — Telephone Encounter (Signed)
Pt confirmed labs/ov per 10/04 POF, gave pt AVS and Calendar.... KJ °

## 2015-07-02 NOTE — Progress Notes (Signed)
[name]    HEMATOLOGY/ONCOLOGY CONSULTATION NOTE  Date of Service: 07/02/2015  Patient Care Team: Glendale Chard, MD as PCP - General (Internal Medicine)  CHIEF COMPLAINTS/PURPOSE OF CONSULTATION:  Evaluation of monoclonal gammopathy of undetermined significance and anemia  HISTORY OF PRESENTING ILLNESS:  Hannah [name] is a wonderful 69 y.o. female who has been referred to [place] by Dr Glendale Chard for evaluation and management of monoclonal gammopathy of undetermined significance and anemia.  Patient has a history of hypertension, diabetes, dyslipidemia, gout, hypothyroidism, chronic and disease stage III thought to be related to hypertension. She notes that she sees Dr. Estanislado Pandy and was recently diagnosed with SLE/rheumatoid arthritis. Her workup also demonstrated monoclonal gammopathy of undetermined significance with an M spike of 0.8 of the total 1.5 g/dL of protein in the gammaglobulin region. IFE showed that this was a monoclonal IgG lambda protein.total IgG 1710.  Other rheumatology workup showed a rheumatoid factor of 29, positive ANA with titers 1:320  Centromere pattern.Smith antibody positive.  CBC on 06/06/2015 showed a hemoglobin of 8.6, hematocrit 26.7MCV 88.7 RDW 18.1 with platelet count of 171k normal WBC count of 5.4k.  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 Has had some fatigue for a while and does not note that this has worsened significantly.. 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  Notes that she has been Anemic in the past and has been on over-the-counter iron supplements for iron deficiency. No other known blood disorders. Denies need for recent transfusions.  MEDICAL HISTORY:  Past Medical History  Diagnosis Date  . Hypertension   . Asthma   . Hyperlipidemia   . Arthritis   . Diabetes mellitus without complication ([place])   . Gout 12/17/2014    patient reported  vitamin D  deficiency Newly diagnosed rheumatoid arthritis/SLE Chronic kidney disease stage III Hypothyroidism   SURGICAL HISTORY: History reviewed. No pertinent past surgical history.  SOCIAL HISTORY: Social History   Social History  . Marital Status: Single    Spouse Name: N/A  . Number of Children: 0  . Years of Education: N/A   Occupational History  . retired    Social History Main Topics  . Smoking status: Never Smoker   . Smokeless tobacco: Never Used  . Alcohol Use: Yes     Comment: occasional  . Drug Use: No  . Sexual Activity: Not on file   Other Topics Concern  . Not on file   Social History Narrative    FAMILY HISTORY: Family History  Problem Relation Age of Onset  . Heart disease Father   . Hypertension Mother   . Diabetes Father     ALLERGIES:  is allergic to shellfish allergy.  MEDICATIONS:  Current Outpatient Prescriptions  Medication Sig Dispense Refill  . allopurinol (ZYLOPRIM) 100 MG tablet Take 100 mg by mouth daily.    [name] aspirin EC 81 MG tablet Take 81 mg by mouth daily.    [name] atorvastatin (LIPITOR) 80 MG tablet Take 80 mg by mouth daily.    . Azilsartan-Chlorthalidone (EDARBYCLOR) 40-12.5 MG TABS Take 1 tablet by mouth daily.    . budesonide-formoterol (SYMBICORT) 160-4.5 MCG/ACT inhaler Take 2 puffs first thing in am and then another 2 puffs about 12 hours later. 6 g 11  . Cholecalciferol (VITAMIN D PO) Take 1 capsule by mouth daily.    . hydroxychloroquine (PLAQUENIL) 200 MG tablet TK 1 T PO QD  2  .  levothyroxine (SYNTHROID, LEVOTHROID) 75 MCG tablet Take 75 mcg by mouth daily.    . metFORMIN (GLUCOPHAGE) 500 MG tablet Take 500 mg by mouth 3 (three) times daily.      No current facility-administered medications for this visit.    REVIEW OF SYSTEMS:    10 Point review of Systems was done is negative except as noted above.  PHYSICAL EXAMINATION: ECOG PERFORMANCE STATUS: 2 - Symptomatic, <50% confined to bed  . Filed Vitals:   07/02/15  1412  Height: 4' 11.5" (1.511 m)  Weight: 148 lb 1 oz (67.161 kg)   Filed Weights   07/02/15 1412  Weight: 148 lb 1 oz (67.161 kg)   .Body mass index is 29.42 kg/(m^2).  GENERAL:alert, in no acute distress and comfortable SKIN: skin color, texture, turgor are normal, no rashes or significant lesions EYES: normal, conjunctiva are pink and non-injected, sclera clear OROPHARYNX:no exudate, no erythema and lips, buccal mucosa, and tongue normal  NECK: supple, no JVD, thyroid normal size, non-tender, without nodularity LYMPH:  no palpable lymphadenopathy in the cervical, axillary or inguinal LUNGS: clear to auscultation with normal respiratory effort HEART: regular rate & rhythm,  no murmurs and no lower extremity edema ABDOMEN: abdomen soft, non-tender, normoactive bowel sounds  Musculoskeletal: no cyanosis of digits and no clubbing  PSYCH: alert & oriented x 3 with fluent speech NEURO: no focal motor/sensory deficits  LABORATORY DATA:  I have reviewed the data as listed  . CBC Latest Ref Rng 07/02/2015 04/05/2015 02/27/2015  WBC 3.9 - 10.3 10e3/uL 5.6 6.6 6.9  Hemoglobin 11.6 - 15.9 g/dL 9.3(L) 9.2(L) 8.4(L)  Hematocrit 34.8 - 46.6 % 28.8(L) 28.4(L) 26.3(L)  Platelets 145 - 400 10e3/uL 137(L) 171.0 143(L)   . CBC    Component Value Date/Time   WBC 5.6 07/02/2015 1616   WBC 6.6 04/05/2015 1708   RBC 3.13* 07/02/2015 1616   RBC 3.26* 04/05/2015 1708   HGB 9.3* 07/02/2015 1616   HGB 9.2* 04/05/2015 1708   HCT 28.8* 07/02/2015 1616   HCT 28.4* 04/05/2015 1708   PLT 137* 07/02/2015 1616   PLT 171.0 04/05/2015 1708   MCV 92.0 07/02/2015 1616   MCV 87.1 04/05/2015 1708   MCH 29.7 07/02/2015 1616   MCH 27.9 02/27/2015 0243   MCHC 32.3 07/02/2015 1616   MCHC 32.3 04/05/2015 1708   RDW 19.6* 07/02/2015 1616   RDW 20.7* 04/05/2015 1708   LYMPHSABS 1.5 07/02/2015 1616   LYMPHSABS 1.4 04/05/2015 1708   MONOABS 0.6 07/02/2015 1616   MONOABS 0.6 04/05/2015 1708   EOSABS 0.7*  07/02/2015 1616   EOSABS 0.8* 04/05/2015 1708   BASOSABS 0.1 07/02/2015 1616   BASOSABS 0.1 04/05/2015 1708    . CMP Latest Ref Rng 07/02/2015 02/27/2015  Glucose 70 - 140 mg/dl 79 103(H)  BUN 7.0 - 26.0 mg/dL 15.5 11  Creatinine 0.6 - 1.1 mg/dL 1.2(H) 1.08(H)  Sodium 136 - 145 mEq/L 141 138  Potassium 3.5 - 5.1 mEq/L 3.9 4.3  Chloride 101 - 111 mmol/L - 106  CO2 22 - 29 mEq/L 26 23  Calcium 8.4 - 10.4 mg/dL 9.3 8.7(L)  Total Protein 6.4 - 8.3 g/dL 7.2 -  Total Bilirubin 0.20 - 1.20 mg/dL 0.68 -  Alkaline Phos 40 - 150 U/L 71 -  AST 5 - 34 U/L 31 -  ALT 0 - 55 U/L 25 -     . Lab Results  Component Value Date   TOTALPROTELP 6.5 07/03/2015   ALBUMINELP 3.4* 07/03/2015  A1GS 0.3 07/03/2015   A2GS 0.7 07/03/2015   BETS 0.4 07/03/2015   BETA2SER 0.3 07/03/2015   GAMS 1.4 07/03/2015   SPEI * 07/03/2015   Comments: Monoclonal IgG kappa protein is present.Monoclonal IgG lambda protein is present.Monoclonal free lambda light chains present.  Comments: A restricted band consistent with monoclonal protein is present.The monoclonal protein peak accounts for 0.6 g/dL of the total1.4 g/dL of protein in the gamma region. Results are consistent with SPE performed on 06/06/15.   Lab Results  Component Value Date   KPAFRELGTCHN 5.58* 07/03/2015   LAMBDASER 16.10* 07/03/2015   KAPLAMBRATIO 0.35 07/03/2015   (kappa/lambda light chains)  . Lab Results  Component Value Date   IRON 172* 07/03/2015   TIBC 316 07/03/2015   IRONPCTSAT 54 07/03/2015   (Iron and TIBC)  Lab Results  Component Value Date   FERRITIN 35 07/03/2015   Component     Latest Ref Rng 07/03/2015  LDH     125 - 245 U/L 203  Erythropoietin     2.6 - 18.5 mIU/mL 18.1  DAT, Polyspecific     NEGATIVE NEG  Haptoglobin     43 - 212 mg/dL 98  Vitamin B-12     211 - 911 pg/mL 420  Beta-2 Microglobulin     <=2.51 mg/L 5.75 (H)  CRP     <0.60 mg/dL <0.5  Sed Rate     0 - 30 mm/hr 19       Peripheral  blood smear : personally reviewed by me  Dimorphic red blood cell population with significantly increased acanthocytes. No increased schistocytes. Few target cells, elliptocytes and teardrops. Mild myeloid left shift with few metamyelocytes and myelocytes. Mild eosinophilia. No increased blasts. Pelgeroid neutrophils noted. No overt rouleaux formation.     RADIOGRAPHIC STUDIES: I have personally reviewed the radiological images as listed and agreed with the findings in the report. Dg Bone Survey Met  07/08/2015  CLINICAL DATA:  MGUS, anemia EXAM: METASTATIC BONE SURVEY COMPARISON:  None. FINDINGS: Heart is normal size. Right diaphragmatic hernia again noted, unchanged. Lungs are clear. No effusions. No focal lytic lesions within the visualized bony structures or acute bony abnormality. Degenerative changes within the shoulders, lower cervical spine, mid to lower thoracic spine, and lower lumbar spine. Advanced degenerative facet disease in the lower lumbar spine. 9 mm of anterolisthesis of L4 on L5. Endplate sclerosis within L4 and L5. Mild degenerative changes in the hips. Advanced degenerative changes within the knees. 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 IMPRESSION: No focal lytic lesion. Degenerative joint disease involving multiple joints as described above. Grade 2 anterolisthesis of L4 on L5 related to facet disease. Electronically Signed   By: Rolm Baptise M.D.   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 Bone survey X-ray showed no lytic lesions. M spike is 0.6 g/dL. Urine IFE shows some Bence-Jones protein. Serum free light chain ratio shows lambda chain predominance. 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  #2 Normocytic normochromic anemia with unclear etiology.certainly can be multifactorial. Recent  diagnosis of lupus/rheumatoid arthritis can cause anemia of chronic inflammation. Patient's sedimentation rate and CRP are unimpressive at this time. Iron levels and B12 within normal limits. Increased acanthocytes on peripheral blood smear -no overt evidence of liver disease. Possibility of MDS given dimorphic RBC population, pelgeroid neutrophils and increased acanthocytes. Rule  out multiple myeloma. No overt lytic bone lesions but patient is anemic. Has some chronic kidney disease that is likely related to her hypertension. No hypercalcemia. No increased schistocytes or microspherocytes. Also LDH level within normal limits all suggest against a hemolytic anemia related to her underlying connective tissue disorder. Possibly due to anemia of chronic disease related to her chronic kidney disease stage III. Erythropoietin levels are lowish at 18  #3 mild eosinophilia likely related to her connective tissue disorder.  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 Estanislado Pandy.  Plan  -Will get a CT guided bone marrow aspiration and biopsy to check for multiple myeloma and rule out MDS. -Would recommend taking a multivitamin and additional B complex. -a bone marrow biopsy unrevealing would consider the use of Aranesp to treat anemia of chronic disease related to her chronic inflammation and chronic kidney disease. This would require possible IV iron replacement to a ferritin of more than 100. - return to care with Dr. Irene Limbo after bone marrow biopsy/Aspiration within 3-4 weeks -continue follow-up with primary care physician and rheumatology.   All of the patients questions were answered To her apparent satisfaction. The patient knows to call the clinic with any problems, questions or concerns.  I spent 50 minutes counseling the patient face to face. The total time spent in the appointment was 60 minutes and more than 50% was on counseling and direct patient cares.      Sullivan Lone MD [place] [facility] Hematology/Oncology Physician [place]  (Office):       [phone] (Work cell):  [phone] (Fax):           [phone]  07/02/2015 3:07 PM

## 2015-07-03 ENCOUNTER — Other Ambulatory Visit (HOSPITAL_BASED_OUTPATIENT_CLINIC_OR_DEPARTMENT_OTHER): Payer: Commercial Managed Care - HMO

## 2015-07-03 ENCOUNTER — Other Ambulatory Visit: Payer: Self-pay | Admitting: Oncology

## 2015-07-03 DIAGNOSIS — N08 Glomerular disorders in diseases classified elsewhere: Secondary | ICD-10-CM | POA: Diagnosis not present

## 2015-07-03 DIAGNOSIS — D472 Monoclonal gammopathy: Secondary | ICD-10-CM | POA: Diagnosis not present

## 2015-07-03 DIAGNOSIS — D649 Anemia, unspecified: Secondary | ICD-10-CM | POA: Diagnosis not present

## 2015-07-03 DIAGNOSIS — N183 Chronic kidney disease, stage 3 (moderate): Secondary | ICD-10-CM | POA: Diagnosis not present

## 2015-07-03 DIAGNOSIS — E1122 Type 2 diabetes mellitus with diabetic chronic kidney disease: Secondary | ICD-10-CM | POA: Diagnosis not present

## 2015-07-03 DIAGNOSIS — I129 Hypertensive chronic kidney disease with stage 1 through stage 4 chronic kidney disease, or unspecified chronic kidney disease: Secondary | ICD-10-CM | POA: Diagnosis not present

## 2015-07-03 DIAGNOSIS — Z23 Encounter for immunization: Secondary | ICD-10-CM | POA: Diagnosis not present

## 2015-07-03 LAB — IRON AND TIBC CHCC
%SAT: 54 % (ref 21–57)
Iron: 172 ug/dL — ABNORMAL HIGH (ref 41–142)
TIBC: 316 ug/dL (ref 236–444)
UIBC: 144 ug/dL (ref 120–384)

## 2015-07-03 LAB — LACTATE DEHYDROGENASE (CC13): LDH: 203 U/L (ref 125–245)

## 2015-07-03 LAB — FERRITIN CHCC: Ferritin: 35 ng/ml (ref 9–269)

## 2015-07-05 LAB — SPEP & IFE WITH QIG
Abnormal Protein Band1: 0.6 g/dL
Albumin ELP: 3.4 g/dL — ABNORMAL LOW (ref 3.8–4.8)
Alpha-1-Globulin: 0.3 g/dL (ref 0.2–0.3)
Alpha-2-Globulin: 0.7 g/dL (ref 0.5–0.9)
Beta 2: 0.3 g/dL (ref 0.2–0.5)
Beta Globulin: 0.4 g/dL (ref 0.4–0.6)
Gamma Globulin: 1.4 g/dL (ref 0.8–1.7)
IgA: 204 mg/dL (ref 69–380)
IgG (Immunoglobin G), Serum: 1530 mg/dL (ref 690–1700)
IgM, Serum: 61 mg/dL (ref 52–322)
Total Protein, Serum Electrophoresis: 6.5 g/dL (ref 6.1–8.1)

## 2015-07-05 LAB — KAPPA/LAMBDA LIGHT CHAINS
Kappa free light chain: 5.58 mg/dL — ABNORMAL HIGH (ref 0.33–1.94)
Kappa:Lambda Ratio: 0.35 (ref 0.26–1.65)
Lambda Free Lght Chn: 16.1 mg/dL — ABNORMAL HIGH (ref 0.57–2.63)

## 2015-07-05 LAB — DIRECT ANTIGLOBULIN RFX ANTI-C3/IGG: DAT, Polyspecific: NEGATIVE

## 2015-07-05 LAB — ERYTHROPOIETIN: Erythropoietin: 18.1 m[IU]/mL (ref 2.6–18.5)

## 2015-07-05 LAB — C-REACTIVE PROTEIN: CRP: <0.5 mg/dL (ref <0.60)

## 2015-07-05 LAB — HAPTOGLOBIN: Haptoglobin: 98 mg/dL (ref 43–212)

## 2015-07-05 LAB — BETA 2 MICROGLOBULIN, SERUM: Beta-2 Microglobulin: 5.75 mg/L — ABNORMAL HIGH (ref <=2.51)

## 2015-07-05 LAB — SEDIMENTATION RATE: Sed Rate: 19 mm/hr (ref 0–30)

## 2015-07-05 LAB — VITAMIN B12: Vitamin B-12: 420 pg/mL (ref 211–911)

## 2015-07-08 ENCOUNTER — Ambulatory Visit (HOSPITAL_COMMUNITY)
Admission: RE | Admit: 2015-07-08 | Discharge: 2015-07-08 | Disposition: A | Discharge status: Home / Self Care | Payer: Commercial Managed Care - HMO | Source: Ambulatory Visit | Attending: Hematology | Admitting: Hematology

## 2015-07-08 DIAGNOSIS — D472 Monoclonal gammopathy: Secondary | ICD-10-CM | POA: Diagnosis not present

## 2015-07-08 DIAGNOSIS — M519 Unspecified thoracic, thoracolumbar and lumbosacral intervertebral disc disorder: Secondary | ICD-10-CM | POA: Diagnosis not present

## 2015-07-08 DIAGNOSIS — D649 Anemia, unspecified: Secondary | ICD-10-CM | POA: Insufficient documentation

## 2015-07-08 DIAGNOSIS — M159 Polyosteoarthritis, unspecified: Secondary | ICD-10-CM | POA: Diagnosis not present

## 2015-07-08 DIAGNOSIS — M4316 Spondylolisthesis, lumbar region: Secondary | ICD-10-CM | POA: Diagnosis not present

## 2015-07-11 DIAGNOSIS — Z79899 Other long term (current) drug therapy: Secondary | ICD-10-CM | POA: Diagnosis not present

## 2015-07-11 DIAGNOSIS — M2550 Pain in unspecified joint: Secondary | ICD-10-CM | POA: Diagnosis not present

## 2015-07-12 ENCOUNTER — Telehealth: Payer: Self-pay | Admitting: *Deleted

## 2015-07-12 NOTE — Telephone Encounter (Signed)
Voicemail: "Dr. Irene Limbo wanted me to have three tests.  I've had lab, 24 hour urine collection, full body xray on Monday.  He wants me to schedule a Bone Marrow but I want results of these test before I schedule this test."

## 2015-07-12 NOTE — Telephone Encounter (Addendum)
Received call from patient. Informed patient that she will need to do the CT biopsy for disease confirmation. Patient verbalized understanding and states that she will call to schedule her appointment.

## 2015-07-13 LAB — 24 HR URINE,KAPPA/LAMBDA LIGHT CHAINS
24H Urine Volume: 900 mL/24 h
Measured Kappa Chain: 1.49 mg/dL (ref <2.00)
Measured Lambda Chain: 1.55 mg/dL (ref <2.00)
Total Kappa Chain: 13.41 mg/24 h
Total Lambda Chain: 13.95 mg/24 h

## 2015-07-13 LAB — UPEP/TP, 24-HR URINE
Collection Interval: 24 hours
Total Protein, Urine/Day: 90 mg/d (ref 50–100)
Total Protein, Urine: 10 mg/dL
Total Volume, Urine: 900 mL

## 2015-07-13 LAB — UIFE/LIGHT CHAINS/TP QN, 24-HR UR
Albumin, U: DETECTED
Alpha 1, Urine: DETECTED — AB
Alpha 2, Urine: DETECTED — AB
Beta, Urine: DETECTED — AB
Gamma Globulin, Urine: DETECTED — AB
Time: 24 hours
Total Protein, Urine: 10 mg/dL (ref 5–24)
Volume, Urine: 900 mL

## 2015-07-15 ENCOUNTER — Other Ambulatory Visit: Payer: Self-pay | Admitting: *Deleted

## 2015-07-18 ENCOUNTER — Encounter: Payer: Self-pay | Admitting: *Deleted

## 2015-07-18 NOTE — Progress Notes (Signed)
[place] Social Work  Clinical Social Work was referred by pt  to review and complete healthcare advance directives.  Clinical Social Worker met with patient in [place] office.  The patient designated Taite Baldassari as their primary healthcare agent and Keydi Giel as their secondary agent.  Patient also completed healthcare living will.    Clinical Social Worker notarized documents and made copies for patient/family. Clinical Social Worker will send documents to medical records to be scanned into patient's chart. Clinical Social Worker encouraged patient/family to contact with any additional questions or concerns.  Loren Racer, [place] Worker [place]  [place] Phone: [phone] Fax: [phone]

## 2015-07-23 ENCOUNTER — Ambulatory Visit (HOSPITAL_COMMUNITY)
Admission: RE | Admit: 2015-07-23 | Discharge: 2015-07-23 | Disposition: A | Discharge status: Home / Self Care | Payer: Commercial Managed Care - HMO | Source: Ambulatory Visit | Attending: Hematology | Admitting: Hematology

## 2015-07-23 ENCOUNTER — Other Ambulatory Visit: Payer: Self-pay | Admitting: Physician Assistant

## 2015-07-23 DIAGNOSIS — Z961 Presence of intraocular lens: Secondary | ICD-10-CM | POA: Diagnosis not present

## 2015-07-23 DIAGNOSIS — Z09 Encounter for follow-up examination after completed treatment for conditions other than malignant neoplasm: Secondary | ICD-10-CM | POA: Diagnosis not present

## 2015-07-23 DIAGNOSIS — H04123 Dry eye syndrome of bilateral lacrimal glands: Secondary | ICD-10-CM | POA: Diagnosis not present

## 2015-07-23 DIAGNOSIS — M3210 Systemic lupus erythematosus, organ or system involvement unspecified: Secondary | ICD-10-CM | POA: Diagnosis not present

## 2015-07-23 DIAGNOSIS — Z049 Encounter for examination and observation for unspecified reason: Secondary | ICD-10-CM | POA: Diagnosis not present

## 2015-07-24 ENCOUNTER — Ambulatory Visit (HOSPITAL_COMMUNITY)
Admission: RE | Admit: 2015-07-24 | Discharge: 2015-07-24 | Disposition: A | Discharge status: Home / Self Care | Payer: Commercial Managed Care - HMO | Source: Ambulatory Visit | Attending: Hematology | Admitting: Hematology

## 2015-07-24 ENCOUNTER — Encounter (HOSPITAL_COMMUNITY): Payer: Self-pay

## 2015-07-24 DIAGNOSIS — I129 Hypertensive chronic kidney disease with stage 1 through stage 4 chronic kidney disease, or unspecified chronic kidney disease: Secondary | ICD-10-CM | POA: Diagnosis not present

## 2015-07-24 DIAGNOSIS — N189 Chronic kidney disease, unspecified: Secondary | ICD-10-CM | POA: Insufficient documentation

## 2015-07-24 DIAGNOSIS — Z833 Family history of diabetes mellitus: Secondary | ICD-10-CM | POA: Diagnosis not present

## 2015-07-24 DIAGNOSIS — D649 Anemia, unspecified: Secondary | ICD-10-CM | POA: Insufficient documentation

## 2015-07-24 DIAGNOSIS — Z7982 Long term (current) use of aspirin: Secondary | ICD-10-CM | POA: Diagnosis not present

## 2015-07-24 DIAGNOSIS — M069 Rheumatoid arthritis, unspecified: Secondary | ICD-10-CM | POA: Insufficient documentation

## 2015-07-24 DIAGNOSIS — E785 Hyperlipidemia, unspecified: Secondary | ICD-10-CM | POA: Insufficient documentation

## 2015-07-24 DIAGNOSIS — D472 Monoclonal gammopathy: Secondary | ICD-10-CM | POA: Diagnosis not present

## 2015-07-24 DIAGNOSIS — M329 Systemic lupus erythematosus, unspecified: Secondary | ICD-10-CM | POA: Insufficient documentation

## 2015-07-24 DIAGNOSIS — Z7984 Long term (current) use of oral hypoglycemic drugs: Secondary | ICD-10-CM | POA: Diagnosis not present

## 2015-07-24 DIAGNOSIS — Z79899 Other long term (current) drug therapy: Secondary | ICD-10-CM | POA: Diagnosis not present

## 2015-07-24 DIAGNOSIS — D72822 Plasmacytosis: Secondary | ICD-10-CM | POA: Diagnosis not present

## 2015-07-24 DIAGNOSIS — E1122 Type 2 diabetes mellitus with diabetic chronic kidney disease: Secondary | ICD-10-CM | POA: Diagnosis not present

## 2015-07-24 DIAGNOSIS — Z8249 Family history of ischemic heart disease and other diseases of the circulatory system: Secondary | ICD-10-CM | POA: Insufficient documentation

## 2015-07-24 LAB — CBC
HCT: 30.7 % — ABNORMAL LOW (ref 36.0–46.0)
Hemoglobin: 9.8 g/dL — ABNORMAL LOW (ref 12.0–15.0)
MCH: 30.2 pg (ref 26.0–34.0)
MCHC: 31.9 g/dL (ref 30.0–36.0)
MCV: 94.8 fL (ref 78.0–100.0)
Platelets: 150 10*3/uL (ref 150–400)
RBC: 3.24 MIL/uL — ABNORMAL LOW (ref 3.87–5.11)
RDW: 18.8 % — ABNORMAL HIGH (ref 11.5–15.5)
WBC: 5.3 10*3/uL (ref 4.0–10.5)

## 2015-07-24 LAB — GLUCOSE, CAPILLARY: Glucose-Capillary: 83 mg/dL (ref 65–99)

## 2015-07-24 LAB — APTT: aPTT: 29 seconds (ref 24–37)

## 2015-07-24 LAB — BONE MARROW EXAM: Bone Marrow Exam: 818

## 2015-07-24 LAB — PROTIME-INR
INR: 1.1 (ref 0.00–1.49)
Prothrombin Time: 14.4 seconds (ref 11.6–15.2)

## 2015-07-24 MED ORDER — FENTANYL CITRATE (PF) 100 MCG/2ML IJ SOLN
INTRAMUSCULAR | Status: AC | PRN
  Administered 2015-07-24: 50 ug via INTRAVENOUS
  Administered 2015-07-24 (×2): 25 ug via INTRAVENOUS

## 2015-07-24 MED ORDER — MIDAZOLAM HCL 2 MG/2ML IJ SOLN
INTRAMUSCULAR | Status: AC
  Filled 2015-07-24: qty 4

## 2015-07-24 MED ORDER — SODIUM CHLORIDE 0.9 % IV SOLN
INTRAVENOUS | Status: DC
  Administered 2015-07-24: 10:00:00 via INTRAVENOUS

## 2015-07-24 MED ORDER — FENTANYL CITRATE (PF) 100 MCG/2ML IJ SOLN
INTRAMUSCULAR | Status: AC
  Filled 2015-07-24: qty 2

## 2015-07-24 MED ORDER — MIDAZOLAM HCL 2 MG/2ML IJ SOLN
INTRAMUSCULAR | Status: AC | PRN
  Administered 2015-07-24: 0.5 mg via INTRAVENOUS
  Administered 2015-07-24: 1 mg via INTRAVENOUS
  Administered 2015-07-24: 0.5 mg via INTRAVENOUS

## 2015-07-24 NOTE — H&P (Signed)
Chief Complaint: Patient was seen in consultation today for  CT-guided bone marrow biopsy Referring Physician(s): Hannah,Gautam [name]  History of Present Illness: Hannah [name] is a 69 y.o. 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 She presents today for CT-guided bone marrow biopsy for further evaluation.  Past Medical History  Diagnosis Date  . Hypertension   . Asthma   . Hyperlipidemia   . Arthritis   . Diabetes mellitus without complication ([place])   . Gout 12/17/2014    patient reported    History reviewed. No pertinent past surgical history.  Allergies: Shellfish allergy  Medications: Prior to Admission medications   Medication Sig Start Date End Date Taking? Authorizing Provider  allopurinol (ZYLOPRIM) 100 MG tablet Take 100 mg by mouth daily.   Yes Historical Provider, MD  aspirin EC 81 MG tablet Take 81 mg by mouth daily.   Yes Historical Provider, MD  atorvastatin (LIPITOR) 80 MG tablet Take 80 mg by mouth daily.   Yes Historical Provider, MD  Azilsartan Medoxomil 40 MG TABS Take 40 mg by mouth once.   Yes Historical Provider, MD  budesonide-formoterol (SYMBICORT) 160-4.5 MCG/ACT inhaler Take 2 puffs first thing in am and then another 2 puffs about 12 hours later. 04/05/15  Yes Tanda Rockers, MD  Cholecalciferol (VITAMIN D PO) Take 1 capsule by mouth daily.   Yes Historical Provider, MD  hydroxychloroquine (PLAQUENIL) 200 MG tablet TK 1 T PO QD 06/28/15  Yes Historical Provider, MD  levothyroxine (SYNTHROID, LEVOTHROID) 75 MCG tablet Take 75 mcg by mouth daily.   Yes Historical Provider, MD  metFORMIN (GLUCOPHAGE) 500 MG tablet Take 500 mg by mouth 3 (three) times daily.    Yes Historical Provider, MD  Azilsartan-Chlorthalidone (EDARBYCLOR) 40-12.5 MG TABS Take 1 tablet by mouth daily.    Historical Provider, MD     Family History    Problem Relation Age of Onset  . Heart disease Father   . Hypertension Mother   . Diabetes Father     Social History   Social History  . Marital Status: Single    Spouse Name: N/A  . Number of Children: 0  . Years of Education: N/A   Occupational History  . retired    Social History Main Topics  . Smoking status: Never Smoker   . Smokeless tobacco: Never Used  . Alcohol Use: Yes     Comment: occasional  . Drug Use: No  . Sexual Activity: Not Asked   Other Topics Concern  . None   Social History Narrative      Review of Systems  Constitutional: Positive for fatigue. Negative for fever and chills.  Respiratory: Negative for cough and shortness of breath.   Cardiovascular: Negative for chest pain.  Gastrointestinal: Negative for nausea, vomiting, abdominal pain and blood in stool.  Genitourinary: Negative for dysuria and hematuria.  Musculoskeletal: Positive for back pain.  Neurological: Negative for headaches.    Vital Signs: BP 140/71 mmHg  Pulse 61  Temp(Src) 98 F (36.7 C) (Oral)  Resp 18  Ht 4' 11"  (1.499 m)  Wt 146 lb 6 oz (66.395 kg)  BMI 29.55 kg/m2  SpO2 100%  Physical Exam  Constitutional: She is oriented to person, place, and time. She appears well-developed and well-nourished.  Cardiovascular: Normal rate and regular rhythm.   Pulmonary/Chest: Effort normal and breath sounds normal.  Abdominal: Soft. Bowel sounds are normal.  There is no tenderness.  Musculoskeletal: She exhibits no edema.  Neurological: She is alert and oriented to person, place, and time.    Mallampati Score:     Imaging: Dg Bone Survey Met  07/08/2015  CLINICAL DATA:  MGUS, anemia EXAM: METASTATIC BONE SURVEY COMPARISON:  None. FINDINGS: Heart is normal size. Right diaphragmatic hernia again noted, unchanged. Lungs are clear. No effusions. No focal lytic lesions within the visualized bony structures or acute bony abnormality. Degenerative changes within the shoulders,  lower cervical spine, mid to lower thoracic spine, and lower lumbar spine. Advanced degenerative facet disease in the lower lumbar spine. 9 mm of anterolisthesis of L4 on L5. Endplate sclerosis within L4 and L5. Mild degenerative changes in the hips. Advanced degenerative changes within the knees. 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 IMPRESSION: No focal lytic lesion. Degenerative joint disease involving multiple joints as described above. Grade 2 anterolisthesis of L4 on L5 related to facet disease. Electronically Signed   By: Rolm Baptise M.D.   On: 07/08/2015 12:53    Labs:  CBC:  Recent Labs  02/27/15 0243 04/05/15 1708 07/02/15 1616 07/24/15 0930  WBC 6.9 6.6 5.6 5.3  HGB 8.4* 9.2* 9.3* 9.8*  HCT 26.3* 28.4* 28.8* 30.7*  PLT 143* 171.0 137* 150    COAGS: No results for input(s): INR, APTT in the last 8760 hours.  BMP:  Recent Labs  02/27/15 0243 07/02/15 1616  NA 138 141  K 4.3 3.9  CL 106  --   CO2 23 26  GLUCOSE 103* 79  BUN 11 15.5  CALCIUM 8.7* 9.3  CREATININE 1.08* 1.2*  GFRNONAA 51*  --   GFRAA 59*  --     LIVER FUNCTION TESTS:  Recent Labs  07/02/15 1616  BILITOT 0.68  AST 31  ALT 25  ALKPHOS 71  PROT 7.2  ALBUMIN 3.7    TUMOR MARKERS: No results for input(s): AFPTM, CEA, CA199, CHROMGRNA in the last 8760 hours.  Assessment and Plan: Hannah [name] is a 69 y.o. 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 She presents today for CT-guided bone marrow biopsy for further evaluation.Risks and benefits discussed with the patient/sister including, but not limited to bleeding, infection, damage to adjacent structures or low yield requiring additional tests.All of the patient's questions were answered, patient is agreeable to proceed. Consent signed and in chart.      Thank you for this interesting  consult.  I greatly enjoyed meeting Hannah [name] and look forward to participating in their care.  A copy of this report was sent to the requesting provider on this date.  Signed: D. Rowe Robert 07/24/2015, 9:42 AM   I spent a total of 15 minutes in face to face in clinical consultation, greater than 50% of which was counseling/coordinating care for CT-guided bone marrow biopsy

## 2015-07-24 NOTE — Procedures (Signed)
Interventional Radiology Procedure Note  Procedure:  CT guided right iliac bone marrow biopsy  Complications:  None  Estimated Blood Loss: < 10 mL  Aspirate and core biopsy samples obtained from right iliac bone.  Venetia Night. Kathlene Cote, M.D Pager:  [phone]

## 2015-07-24 NOTE — Discharge Instructions (Signed)
Bone Marrow Aspiration and Bone Marrow Biopsy, Care After °Refer to this sheet in the next few weeks. These instructions provide you with information about caring for yourself after your procedure. Your health care provider may also give you more specific instructions. Your treatment has been planned according to current medical practices, but problems sometimes occur. Call your health care provider if you have any problems or questions after your procedure. °WHAT TO EXPECT AFTER THE PROCEDURE °After your procedure, it is common to have: °· Soreness or tenderness around the puncture site. °· Bruising. °HOME CARE INSTRUCTIONS °· Take medicines only as directed by your health care provider. °· Follow your health care provider's instructions about: °¨ Puncture site care. °¨ Bandage (dressing) changes and removal. °· Bathe and shower as directed by your health care provider. °· Check your puncture site every day for signs of infection. Watch for: °¨ Redness, swelling, or pain. °¨ Fluid, blood, or pus. °· Return to your normal activities as directed by your health care provider. °· Keep all follow-up visits as directed by your health care provider. This is important. °SEEK MEDICAL CARE IF: °· You have a fever. °· You have uncontrollable bleeding. °· You have redness, swelling, or pain at the site of your puncture. °· You have fluid, blood, or pus coming from your puncture site. °  °This information is not intended to replace advice given to you by your health care provider. Make sure you discuss any questions you have with your health care provider. °  °Document Released: 04/03/2005 Document Revised: 01/29/2015 Document Reviewed: 09/05/2014 °Elsevier Interactive Patient Education ©2016 Elsevier Inc[employer] °Moderate Conscious Sedation, Adult, Care After °Refer to this sheet in the next few weeks. These instructions provide you with information on caring for yourself after your procedure. Your health care provider may also give  you more specific instructions. Your treatment has been planned according to current medical practices, but problems sometimes occur. Call your health care provider if you have any problems or questions after your procedure. °WHAT TO EXPECT AFTER THE PROCEDURE  °After your procedure: °· You may feel sleepy, clumsy, and have poor balance for several hours. °· Vomiting may occur if you eat too soon after the procedure. °HOME CARE INSTRUCTIONS °· Do not participate in any activities where you could become injured for at least 24 hours. Do not: °¨ Drive. °¨ Swim. °¨ Ride a bicycle. °¨ Operate heavy machinery. °¨ Cook. °¨ Use power tools. °¨ Climb ladders. °¨ Work from a high place. °· Do not make important decisions or sign legal documents until you are improved. °· If you vomit, drink water, juice, or soup when you can drink without vomiting. Make sure you have little or no nausea before eating solid foods. °· Only take over-the-counter or prescription medicines for pain, discomfort, or fever as directed by your health care provider. °· Make sure you and your family fully understand everything about the medicines given to you, including what side effects may occur. °· You should not drink alcohol, take sleeping pills, or take medicines that cause drowsiness for at least 24 hours. °· If you smoke, do not smoke without supervision. °· If you are feeling better, you may resume normal activities 24 hours after you were sedated. °· Keep all appointments with your health care provider. °SEEK MEDICAL CARE IF: °· Your skin is pale or bluish in color. °· You continue to feel nauseous or vomit. °· Your pain is getting worse and is not helped by medicine. °·   helped by medicine.  You have bleeding or swelling.  You are still sleepy or feeling clumsy after 24 hours. SEEK IMMEDIATE MEDICAL CARE IF:  You develop a rash.  You have difficulty breathing.  You develop any type of allergic problem.  You have a fever. MAKE SURE  YOU:  Understand these instructions.  Will watch your condition.  Will get help right away if you are not doing well or get worse.   This information is not intended to replace advice given to you by your health care provider. Make sure you discuss any questions you have with your health care provider.   Document Released: 07/05/2013 Document Revised: 10/05/2014 Document Reviewed: 07/05/2013 Elsevier Interactive Patient Education [employer].

## 2015-08-01 LAB — CHROMOSOME ANALYSIS, BONE MARROW

## 2015-08-02 ENCOUNTER — Ambulatory Visit (HOSPITAL_BASED_OUTPATIENT_CLINIC_OR_DEPARTMENT_OTHER): Payer: Commercial Managed Care - HMO | Admitting: Hematology

## 2015-08-02 ENCOUNTER — Encounter: Payer: Self-pay | Admitting: Hematology

## 2015-08-02 ENCOUNTER — Telehealth: Payer: Self-pay | Admitting: Hematology

## 2015-08-02 VITALS — BP 152/75 | HR 74 | Temp 99.1°F | Resp 18 | Ht 59.0 in | Wt 148.9 lb

## 2015-08-02 DIAGNOSIS — D649 Anemia, unspecified: Secondary | ICD-10-CM

## 2015-08-02 DIAGNOSIS — D472 Monoclonal gammopathy: Secondary | ICD-10-CM | POA: Diagnosis not present

## 2015-08-02 NOTE — Telephone Encounter (Signed)
per pof to sch pt appt-gave pt copy of avs °

## 2015-08-05 ENCOUNTER — Encounter (HOSPITAL_COMMUNITY): Payer: Self-pay

## 2015-08-15 DIAGNOSIS — E039 Hypothyroidism, unspecified: Secondary | ICD-10-CM | POA: Diagnosis not present

## 2015-08-16 DIAGNOSIS — E039 Hypothyroidism, unspecified: Secondary | ICD-10-CM | POA: Diagnosis not present

## 2015-08-16 NOTE — Progress Notes (Signed)
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  We reviewed all the available lab results.  No overt evidence of multiple myeloma at this time.  REVIEW OF SYSTEMS:    10 Point review of systems of done and is negative except as noted above.  . Past Medical History  Diagnosis Date  . Hypertension   . Asthma   . Hyperlipidemia   . Arthritis   . Diabetes mellitus without complication ([place])   . Gout 12/17/2014    patient reported  possible SLE/rheumatoid arthritis following with Dr.Deveshwar.  .History reviewed. No pertinent past surgical history.  . Social History  Substance Use Topics  . Smoking status: Never Smoker   . Smokeless tobacco: Never Used  . Alcohol Use: Yes     Comment: occasional    ALLERGIES:  is allergic to shellfish allergy.  MEDICATIONS:  Current Outpatient Prescriptions  Medication Sig Dispense Refill  . allopurinol (ZYLOPRIM) 100 MG tablet Take 100 mg by mouth daily.    [name] aspirin EC 81 MG tablet Take 81 mg by mouth daily.    [name] atorvastatin (LIPITOR) 80 MG tablet Take 80 mg by mouth daily.    . Azilsartan Medoxomil 40 MG TABS Take 40 mg by mouth once.    . Azilsartan-Chlorthalidone (EDARBYCLOR) 40-12.5 MG TABS Take 1 tablet by mouth daily.    . budesonide-formoterol (SYMBICORT) 160-4.5 MCG/ACT inhaler Take 2 puffs first thing in am and then another 2 puffs about 12 hours later. 6 g 11  . Cholecalciferol (VITAMIN D PO) Take 1 capsule by mouth daily.    . Ferrous Sulfate (IRON) 90 (18 FE) MG TABS     . hydroxychloroquine  (PLAQUENIL) 200 MG tablet TK 1 T PO QD Mon-Fri  2  . levothyroxine (SYNTHROID, LEVOTHROID) 75 MCG tablet Take 75 mcg by mouth daily.    . metFORMIN (GLUCOPHAGE) 500 MG tablet Take 500 mg by mouth 3 (three) times daily.      No current facility-administered medications for this visit.    PHYSICAL EXAMINATION: ECOG PERFORMANCE STATUS: 1 - Symptomatic but completely ambulatory  . Filed Vitals:   08/02/15 1132  BP: 152/75  Pulse: 74  Temp: 99.1 F (37.3 C)  Resp: 18    Filed Weights   08/02/15 1132  Weight: 148 lb 14.4 oz (67.541 kg)   .Body mass index is 30.06 kg/(m^2).  GENERAL:alert, in no acute distress and comfortable SKIN: skin color, texture, turgor are normal, no rashes or significant lesions EYES: normal, conjunctiva are pink and non-injected, sclera clear OROPHARYNX:no exudate, no erythema and lips, buccal mucosa, and tongue normal  NECK: supple, no JVD, thyroid normal size, non-tender, without nodularity LYMPH:  no palpable lymphadenopathy in the cervical, axillary or inguinal LUNGS: clear to auscultation with normal respiratory effort HEART: regular rate & rhythm,  no murmurs and no lower extremity edema ABDOMEN: abdomen soft, non-tender, normoactive bowel sounds  Musculoskeletal: no cyanosis of digits and no clubbing  PSYCH: alert & oriented x 3 with fluent speech NEURO: no focal motor/sensory deficits  LABORATORY DATA:   I have reviewed the  data as listed  . CBC Latest Ref Rng 07/24/2015 07/02/2015 04/05/2015  WBC 4.0 - 10.5 K/uL 5.3 5.6 6.6  Hemoglobin 12.0 - 15.0 g/dL 9.8(L) 9.3(L) 9.2(L)  Hematocrit 36.0 - 46.0 % 30.7(L) 28.8(L) 28.4(L)  Platelets 150 - 400 K/uL 150 137(L) 171.0    . CMP Latest Ref Rng 07/02/2015 02/27/2015  Glucose 70 - 140 mg/dl 79 103(H)  BUN 7.0 - 26.0 mg/dL 15.5 11  Creatinine 0.6 - 1.1 mg/dL 1.2(H) 1.08(H)  Sodium 136 - 145 mEq/L 141 138  Potassium 3.5 - 5.1 mEq/L 3.9 4.3  Chloride 101 - 111 mmol/L - 106  CO2 22 - 29 mEq/L 26 23   Calcium 8.4 - 10.4 mg/dL 9.3 8.7(L)  Total Protein 6.4 - 8.3 g/dL 7.2 -  Total Bilirubin 0.20 - 1.20 mg/dL 0.68 -  Alkaline Phos 40 - 150 U/L 71 -  AST 5 - 34 U/L 31 -  ALT 0 - 55 U/L 25 -  . Lab Results  Component Value Date   TOTALPROTELP 6.5 07/03/2015   ALBUMINELP 3.4* 07/03/2015   A1GS 0.3 07/03/2015   A2GS 0.7 07/03/2015   BETS 0.4 07/03/2015   BETA2SER 0.3 07/03/2015   GAMS 1.4 07/03/2015   SPEI * 07/03/2015   (this displays SPEP labs)  Lab Results  Component Value Date   KPAFRELGTCHN 5.58* 07/03/2015   LAMBDASER 16.10* 07/03/2015   KAPLAMBRATIO 0.35 07/03/2015   (kappa/lambda light chains)       RADIOGRAPHIC STUDIES: I have personally reviewed the radiological images as listed and agreed with the findings in the report. Ct Biopsy  07/24/2015  CLINICAL DATA:  Monoclonal gammopathy and anemia. The patient requires bone marrow biopsy for further workup. EXAM: CT GUIDED RIGHT ILIAC BONE MARROW BIOPSY ANESTHESIA/SEDATION: Versed 2.0 mg IV, Fentanyl 100 mcg IV Total Moderate Sedation Time 9 minutes. PROCEDURE: The procedure risks, benefits, and alternatives were explained to the patient. Questions regarding the procedure were encouraged and answered. The patient understands and consents to the procedure. A time-out was performed prior to the procedure. The right gluteal region was prepped with Betadine. Sterile gown and sterile gloves were used for the procedure. Local anesthesia was provided with 1% Lidocaine. Under CT guidance, an 11 gauge OnControl bone cutting needle was advanced from a posterior approach into the right iliac bone. Needle positioning was confirmed with CT. Initial non heparinized and heparinized aspirate samples were obtained of bone marrow. Core biopsy was performed through the 11 gauge needle. COMPLICATIONS: None FINDINGS: Inspection of initial aspirate did reveal visible particles. An intact core biopsy sample was obtained. IMPRESSION: CT guided bone  marrow biopsy of right posterior iliac bone with both aspirate and core samples obtained. Electronically Signed   By: Aletta Edouard M.D.   On: 07/24/2015 13:30   METASTATIC BONE SURVEY 0/02/2015  COMPARISON: None.  FINDINGS: Heart is normal size. Right diaphragmatic hernia again noted, unchanged. Lungs are clear. No effusions.  No focal lytic lesions within the visualized bony structures or acute bony abnormality. Degenerative changes within the shoulders, lower cervical spine, mid to lower thoracic spine, and lower lumbar spine. Advanced degenerative facet disease in the lower lumbar spine. 9 mm of anterolisthesis of L4 on L5. Endplate sclerosis within L4 and L5. Mild degenerative changes in the hips. Advanced degenerative changes within the knees. 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  IMPRESSION: No focal lytic lesion.  Degenerative joint disease involving multiple joints as described above.  Grade 2 anterolisthesis of L4 on L5  related to facet disease. 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 Bone survey X-ray showed no lytic lesions. M spike is 0.6 g/dL. Urine IFE shows some Bence-Jones protein. Serum free light chain ratio shows lambda chain predominance. MGUS related to underlying connective tissue disorder.  Bone marrow biopsy does show about 13% plasma cells however these appear to be polyclonal and likely related to a possible underlying inflammatory disorder. Less likely a biclonal  Plasma cell dyscrasia.  #2 Normocytic normochromic anemia likely related to chronic inflammation from recent diagnosis of lupus/rheumatoid arthritis + CKD. Patient's sedimentation rate and CRP are unimpressive at this time.B12 within normal limits. Ferritin 35 Increased acanthocytes on peripheral blood smear -no overt evidence of liver disease.  Bone marrow biopsy did not  show overt plasma cell dyscrasia or myelodysplastic syndrome. 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 Bone marrow biopsy showed decreased iron stores #3 mild eosinophilia likely related to her connective tissue disorder.  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 Estanislado Pandy. Plan -continue multivitamin and additional B-complex. -Will discuss with patient regarding IV iron replacement vs PO ferrous sulfate. -if getting more anemic despite ferritin ?100 might consider aranesp. -rpt SPEP with QIG/IFE in 3 months.  - return to care with Dr. Irene Limbo in 3 months with cbc, cmp ferritin and SPEP/QIG/IFE -continue follow-up with primary care physician and rheumatology.   . Orders Placed This Encounter  Procedures  . CBC & Diff and Retic    Standing Status: Future     Number of Occurrences:      Standing Expiration Date: 09/05/2016  . Comprehensive metabolic panel    Standing Status: Future     Number of Occurrences:      Standing Expiration Date: 09/05/2016  . SPEP & IFE with QIG    Standing Status: Future     Number of Occurrences:      Standing Expiration Date: 09/05/2016  . Kappa/lambda light chains    Standing Status: Future     Number of Occurrences:      Standing Expiration Date: 09/05/2016  . Ferritin    Standing Status: Future     Number of Occurrences:      Standing Expiration Date: 08/01/2016  . Iron and TIBC    Standing Status: Future     Number of Occurrences:      Standing Expiration Date: 08/01/2016    I spent 25 minutes counseling the patient face to face. The total time spent in the appointment was 30 minutes and more than 50% was on counseling and direct patient cares.    Sullivan Lone MD [place] AAHIVMS [facility] Hematology/Oncology Physician [facility]  (Office):       [phone] (Work cell):  [phone] (Fax):           [phone]

## 2015-09-18 DIAGNOSIS — N183 Chronic kidney disease, stage 3 (moderate): Secondary | ICD-10-CM | POA: Diagnosis not present

## 2015-09-18 DIAGNOSIS — E039 Hypothyroidism, unspecified: Secondary | ICD-10-CM | POA: Diagnosis not present

## 2015-09-18 DIAGNOSIS — N08 Glomerular disorders in diseases classified elsewhere: Secondary | ICD-10-CM | POA: Diagnosis not present

## 2015-09-18 DIAGNOSIS — I129 Hypertensive chronic kidney disease with stage 1 through stage 4 chronic kidney disease, or unspecified chronic kidney disease: Secondary | ICD-10-CM | POA: Diagnosis not present

## 2015-09-18 DIAGNOSIS — E1122 Type 2 diabetes mellitus with diabetic chronic kidney disease: Secondary | ICD-10-CM | POA: Diagnosis not present

## 2015-09-19 DIAGNOSIS — M3210 Systemic lupus erythematosus, organ or system involvement unspecified: Secondary | ICD-10-CM | POA: Diagnosis not present

## 2015-09-19 DIAGNOSIS — M0579 Rheumatoid arthritis with rheumatoid factor of multiple sites without organ or systems involvement: Secondary | ICD-10-CM | POA: Diagnosis not present

## 2015-09-19 DIAGNOSIS — Z79899 Other long term (current) drug therapy: Secondary | ICD-10-CM | POA: Diagnosis not present

## 2015-09-19 DIAGNOSIS — M19041 Primary osteoarthritis, right hand: Secondary | ICD-10-CM | POA: Diagnosis not present

## 2015-10-15 DIAGNOSIS — Z79899 Other long term (current) drug therapy: Secondary | ICD-10-CM | POA: Diagnosis not present

## 2015-10-28 DIAGNOSIS — E559 Vitamin D deficiency, unspecified: Secondary | ICD-10-CM | POA: Diagnosis not present

## 2015-10-28 DIAGNOSIS — E039 Hypothyroidism, unspecified: Secondary | ICD-10-CM | POA: Diagnosis not present

## 2015-11-04 ENCOUNTER — Encounter: Payer: Self-pay | Admitting: Hematology

## 2015-11-04 ENCOUNTER — Ambulatory Visit (HOSPITAL_BASED_OUTPATIENT_CLINIC_OR_DEPARTMENT_OTHER): Payer: Commercial Managed Care - HMO | Admitting: Hematology

## 2015-11-04 ENCOUNTER — Other Ambulatory Visit (HOSPITAL_BASED_OUTPATIENT_CLINIC_OR_DEPARTMENT_OTHER): Payer: Commercial Managed Care - HMO

## 2015-11-04 ENCOUNTER — Telehealth: Payer: Self-pay | Admitting: Hematology

## 2015-11-04 VITALS — BP 139/60 | HR 64 | Temp 98.3°F | Resp 17 | Ht 59.0 in | Wt 154.7 lb

## 2015-11-04 DIAGNOSIS — D649 Anemia, unspecified: Secondary | ICD-10-CM | POA: Diagnosis not present

## 2015-11-04 DIAGNOSIS — D472 Monoclonal gammopathy: Secondary | ICD-10-CM

## 2015-11-04 LAB — COMPREHENSIVE METABOLIC PANEL
ALT: 26 U/L (ref 0–55)
AST: 26 U/L (ref 5–34)
Albumin: 3.6 g/dL (ref 3.5–5.0)
Alkaline Phosphatase: 57 U/L (ref 40–150)
Anion Gap: 8 mEq/L (ref 3–11)
BUN: 22.1 mg/dL (ref 7.0–26.0)
CO2: 26 mEq/L (ref 22–29)
Calcium: 9 mg/dL (ref 8.4–10.4)
Chloride: 107 mEq/L (ref 98–109)
Creatinine: 1.4 mg/dL — ABNORMAL HIGH (ref 0.6–1.1)
EGFR: 46 mL/min/{1.73_m2} — ABNORMAL LOW (ref >90)
Glucose: 89 mg/dl (ref 70–140)
Potassium: 4.1 mEq/L (ref 3.5–5.1)
Sodium: 142 mEq/L (ref 136–145)
Total Bilirubin: 0.54 mg/dL (ref 0.20–1.20)
Total Protein: 6.9 g/dL (ref 6.4–8.3)

## 2015-11-04 LAB — CBC & DIFF AND RETIC
BASO%: 1.1 % (ref 0.0–2.0)
Basophils Absolute: 0.1 10*3/uL (ref 0.0–0.1)
EOS%: 13.1 % — ABNORMAL HIGH (ref 0.0–7.0)
Eosinophils Absolute: 0.6 10*3/uL — ABNORMAL HIGH (ref 0.0–0.5)
HCT: 29.3 % — ABNORMAL LOW (ref 34.8–46.6)
HGB: 9.5 g/dL — ABNORMAL LOW (ref 11.6–15.9)
Immature Retic Fract: 7.1 % (ref 1.60–10.00)
LYMPH%: 27 % (ref 14.0–49.7)
MCH: 31.8 pg (ref 25.1–34.0)
MCHC: 32.4 g/dL (ref 31.5–36.0)
MCV: 98 fL (ref 79.5–101.0)
MONO#: 0.4 10*3/uL (ref 0.1–0.9)
MONO%: 8.3 % (ref 0.0–14.0)
NEUT#: 2.3 10*3/uL (ref 1.5–6.5)
NEUT%: 50.5 % (ref 38.4–76.8)
Platelets: 135 10*3/uL — ABNORMAL LOW (ref 145–400)
RBC: 2.99 10*6/uL — ABNORMAL LOW (ref 3.70–5.45)
RDW: 15.1 % — ABNORMAL HIGH (ref 11.2–14.5)
Retic %: 1.31 % (ref 0.70–2.10)
Retic Ct Abs: 39.17 10*3/uL (ref 33.70–90.70)
WBC: 4.6 10*3/uL (ref 3.9–10.3)
lymph#: 1.2 10*3/uL (ref 0.9–3.3)
nRBC: 0 % (ref 0–0)

## 2015-11-04 LAB — IRON AND TIBC
%SAT: 34 % (ref 21–57)
Iron: 97 ug/dL (ref 41–142)
TIBC: 281 ug/dL (ref 236–444)
UIBC: 184 ug/dL (ref 120–384)

## 2015-11-04 LAB — FERRITIN: Ferritin: 33 ng/ml (ref 9–269)

## 2015-11-04 NOTE — Progress Notes (Signed)
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 is here for followup regarding workup of her MGUS and anemia. She notes no acute new symptoms. Her energy levels are about the same. Notes that her TSH was elevated and her levothyroxine dose was recently increased from 88 mcg daily to 110 mcg daily. Has been taking ferrous sulfate 318m po BID with stable ferritin level but no improvement. Has a followup scheduled with a nephrologist to evaluate her CKD thought to be related to HTN and Dm2. No other acute new concerns.  REVIEW OF SYSTEMS:    10 Point review of systems of done and is negative except as noted above.  . Past Medical History  Diagnosis Date  . Hypertension   . Asthma   . Hyperlipidemia   . Arthritis   . [place]   . Gout 12/17/2014    patient reported  possible SLE/rheumatoid arthritis following with Dr.Deveshwar.  .History reviewed. No pertinent past surgical history.  . Social History  Substance Use Topics  . Smoking status: Never Smoker   . Smokeless tobacco: Never Used  . Alcohol Use: Yes     Comment: occasional    ALLERGIES:  is allergic to shellfish allergy.  MEDICATIONS:  Current Outpatient Prescriptions  Medication Sig Dispense Refill  . levothyroxine (SYNTHROID, LEVOTHROID) 100 MCG tablet Take 100 mcg by mouth daily before breakfast.    . allopurinol (ZYLOPRIM) 100 MG tablet Take 100 mg by mouth daily.    .[name] Kitchenaspirin EC 81 MG tablet Take 81 mg by mouth daily.    .[name] Kitchenatorvastatin (LIPITOR) 80 MG tablet Take 80 mg by mouth daily.    . Azilsartan Medoxomil 40 MG TABS Take 40 mg by  mouth once.    . Azilsartan-Chlorthalidone (EDARBYCLOR) 40-12.5 MG TABS Take 1 tablet by mouth daily.    . budesonide-formoterol (SYMBICORT) 160-4.5 MCG/ACT inhaler Take 2 puffs first thing in am and then another 2 puffs about 12 hours later. 6 g 11  . Cholecalciferol (VITAMIN D PO) Take 1 capsule by mouth daily.    . Ferrous Sulfate (IRON) 90 (18 FE) MG TABS     . hydroxychloroquine (PLAQUENIL) 200 MG tablet TK 1 T PO QD Mon-Fri  2  . levothyroxine (SYNTHROID, LEVOTHROID) 75 MCG tablet Take 75 mcg by mouth daily.    . metFORMIN (GLUCOPHAGE) 500 MG tablet Take 500 mg by mouth 3 (three) times daily.      No current facility-administered medications for this visit.    PHYSICAL EXAMINATION: ECOG PERFORMANCE STATUS: 1 - Symptomatic but completely ambulatory  . Filed Vitals:   11/04/15 1031 11/04/15 1032  BP: 150/62 139/60  Pulse: 64   Temp: 98.3 F (36.8 C)   Resp: 17     Filed Weights   11/04/15 1031  Weight: 154 lb 11.2 oz (70.171 kg)   .Body mass index is 31.23 kg/(m^2).  GENERAL:alert, in no acute distress and comfortable SKIN: skin color, texture, turgor are normal, no rashes or significant lesions EYES: normal, conjunctiva are pink and non-injected, sclera clear OROPHARYNX:no exudate, no erythema and lips, buccal mucosa, and tongue normal  NECK: supple, no JVD, thyroid  normal size, non-tender, without nodularity LYMPH:  no palpable lymphadenopathy in the cervical, axillary or inguinal LUNGS: clear to auscultation with normal respiratory effort HEART: regular rate & rhythm,  no murmurs and no lower extremity edema ABDOMEN: abdomen soft, non-tender, normoactive bowel sounds  Musculoskeletal: no cyanosis of digits and no clubbing  PSYCH: alert & oriented x 3 with fluent speech NEURO: no focal motor/sensory deficits  LABORATORY DATA:   I have reviewed the data as listed  . CBC Latest Ref Rng 11/04/2015 07/24/2015 07/02/2015  WBC 3.9 - 10.3 10e3/uL 4.6 5.3 5.6  Hemoglobin  11.6 - 15.9 g/dL 9.5(L) 9.8(L) 9.3(L)  Hematocrit 34.8 - 46.6 % 29.3(L) 30.7(L) 28.8(L)  Platelets 145 - 400 10e3/uL 135(L) 150 137(L)    . CMP Latest Ref Rng 11/04/2015 07/02/2015 02/27/2015  Glucose 70 - 140 mg/dl 89 79 103(H)  BUN 7.0 - 26.0 mg/dL 22.1 15.5 11  Creatinine 0.6 - 1.1 mg/dL 1.4(H) 1.2(H) 1.08(H)  Sodium 136 - 145 mEq/L 142 141 138  Potassium 3.5 - 5.1 mEq/L 4.1 3.9 4.3  Chloride 101 - 111 mmol/L - - 106  CO2 22 - 29 mEq/L 26 26 23   Calcium 8.4 - 10.4 mg/dL 9.0 9.3 8.7(L)  Total Protein 6.4 - 8.3 g/dL 6.9 7.2 -  Total Bilirubin 0.20 - 1.20 mg/dL 0.54 0.68 -  Alkaline Phos 40 - 150 U/L 57 71 -  AST 5 - 34 U/L 26 31 -  ALT 0 - 55 U/L 26 25 -  . Lab Results  Component Value Date   TOTALPROTELP 6.5 07/03/2015   ALBUMINELP 3.4* 07/03/2015   A1GS 0.3 07/03/2015   A2GS 0.7 07/03/2015   BETS 0.4 07/03/2015   BETA2SER 0.3 07/03/2015   GAMS 1.4 07/03/2015   SPEI * 07/03/2015   (this displays SPEP labs)  Lab Results  Component Value Date   KPAFRELGTCHN 5.58* 07/03/2015   LAMBDASER 16.10* 07/03/2015   KAPLAMBRATIO 0.35 07/03/2015   (kappa/lambda light chains) . Lab Results  Component Value Date   IRON 97 11/04/2015   TIBC 281 11/04/2015   IRONPCTSAT 34 11/04/2015   (Iron and TIBC)  Lab Results  Component Value Date   FERRITIN 33 11/04/2015          RADIOGRAPHIC STUDIES: I have personally reviewed the radiological images as listed and agreed with the findings in the report. No results found. METASTATIC BONE SURVEY 0/02/2015  COMPARISON: None.  FINDINGS: Heart is normal size. Right diaphragmatic hernia again noted, unchanged. Lungs are clear. No effusions.  No focal lytic lesions within the visualized bony structures or acute bony abnormality. Degenerative changes within the shoulders, lower cervical spine, mid to lower thoracic spine, and lower lumbar spine. Advanced degenerative facet disease in the lower lumbar spine. 9 mm of  anterolisthesis of L4 on L5. Endplate sclerosis within L4 and L5. Mild degenerative changes in the hips. Advanced degenerative changes within the knees. 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  IMPRESSION: No focal lytic lesion.  Degenerative joint disease involving multiple joints as described above.  Grade 2 anterolisthesis of L4 on L5 related to facet disease.   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 Bone survey X-ray showed no lytic lesions. M spike is 0.6 g/dL. Urine IFE shows some Bence-Jones protein. Serum free light chain ratio shows lambda chain predominance. MGUS related to underlying connective tissue disorder.  Bone marrow biopsy does show about 13% plasma cells  however these appear to be polyclonal and likely related to a possible underlying inflammatory disorder. Less likely a biclonal  Plasma cell dyscrasia.  #2 Normocytic normochromic anemia likely related to chronic inflammation from recent diagnosis of lupus/rheumatoid arthritis + CKD. Patient's sedimentation rate and CRP are unimpressive at this time.B12 within normal limits. Ferritin 35. Ferritin today still 33 despite increasing po iron intake. Increased acanthocytes on peripheral blood smear -no overt evidence of liver disease.  Bone marrow biopsy did not show overt plasma cell dyscrasia or myelodysplastic syndrome. 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 Bone marrow biopsy showed decreased iron stores #3 mild eosinophilia likely related to her connective tissue disorder.  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 Estanislado Pandy. Plan -continue multivitamin and additional B-complex. -we rediscussed consideration of IV feraheme given no improvement in ferritin despite adequate po iron intake.  Patient wants to think about this. The goals would be to keep ferritin >100 and then if despite this she is getting more anemic might consider aranesp. -rpt SPEP with QIG/IFE -results pending -patient will call us and let us known regarding her decision to consider IV feraheme  - return to care with Dr. Irene Limbo in 3 months with cbc, cmp ferritin and SPEP/QIG/IFE -continue follow-up with primary care physician, nephrology and rheumatology.   . Orders Placed This Encounter  Procedures  . CBC & Diff and Retic    Standing Status: Future     Number of Occurrences:      Standing Expiration Date: 12/08/2016  . Comprehensive metabolic panel    Standing Status: Future     Number of Occurrences:      Standing Expiration Date: 11/03/2016  . SPEP & IFE with QIG    Standing Status: Future     Number of Occurrences:      Standing Expiration Date: 12/08/2016  . Ferritin    Standing Status: Future     Number of Occurrences:      Standing Expiration Date: 11/03/2016    I spent 15 minutes counseling the patient face to face. The total time spent in the appointment was 20 minutes and more than 50% was on counseling and direct patient cares.    Sullivan Lone MD [place] AAHIVMS [facility]: [facility] Hematology/Oncology Physician [facility]  (Office):       [phone] (Work cell):  [phone] (Fax):           [phone]

## 2015-11-04 NOTE — Telephone Encounter (Signed)
Pt confirmed labs/ov per 02/06 POF, gave pt AVS and Calendar... KJ °

## 2015-11-05 LAB — MULTIPLE MYELOMA PANEL, SERUM
Albumin SerPl Elph-Mcnc: 3.6 g/dL (ref 2.9–4.4)
Albumin/Glob SerPl: 1.3 (ref 0.7–1.7)
Alpha 1: 0.2 g/dL (ref 0.0–0.4)
Alpha2 Glob SerPl Elph-Mcnc: 0.6 g/dL (ref 0.4–1.0)
B-Globulin SerPl Elph-Mcnc: 0.9 g/dL (ref 0.7–1.3)
Gamma Glob SerPl Elph-Mcnc: 1.3 g/dL (ref 0.4–1.8)
Globulin, Total: 3 g/dL (ref 2.2–3.9)
IgA, Qn, Serum: 183 mg/dL (ref 87–352)
IgG, Qn, Serum: 1251 mg/dL (ref 700–1600)
IgM, Qn, Serum: 43 mg/dL (ref 26–217)
M Protein SerPl Elph-Mcnc: 0.4 g/dL — ABNORMAL HIGH
Total Protein: 6.6 g/dL (ref 6.0–8.5)

## 2015-11-05 LAB — KAPPA/LAMBDA LIGHT CHAINS
Ig Kappa Free Light Chain: 57.6 mg/L — ABNORMAL HIGH (ref 3.30–19.40)
Ig Lambda Free Light Chain: 175.84 mg/L — ABNORMAL HIGH (ref 5.71–26.30)
Kappa/Lambda FluidC Ratio: 0.33 (ref 0.26–1.65)

## 2015-11-11 DIAGNOSIS — N183 Chronic kidney disease, stage 3 (moderate): Secondary | ICD-10-CM | POA: Diagnosis not present

## 2015-11-11 DIAGNOSIS — N2581 Secondary hyperparathyroidism of renal origin: Secondary | ICD-10-CM | POA: Diagnosis not present

## 2015-11-27 DIAGNOSIS — E039 Hypothyroidism, unspecified: Secondary | ICD-10-CM | POA: Diagnosis not present

## 2015-12-02 ENCOUNTER — Other Ambulatory Visit: Payer: Self-pay | Admitting: Nephrology

## 2015-12-02 DIAGNOSIS — N183 Chronic kidney disease, stage 3 unspecified: Secondary | ICD-10-CM

## 2015-12-04 ENCOUNTER — Ambulatory Visit
Admission: RE | Admit: 2015-12-04 | Discharge: 2015-12-04 | Disposition: A | Discharge status: Home / Self Care | Payer: Commercial Managed Care - HMO | Source: Ambulatory Visit | Attending: Nephrology | Admitting: Nephrology

## 2015-12-04 DIAGNOSIS — N183 Chronic kidney disease, stage 3 unspecified: Secondary | ICD-10-CM

## 2015-12-04 DIAGNOSIS — I129 Hypertensive chronic kidney disease with stage 1 through stage 4 chronic kidney disease, or unspecified chronic kidney disease: Secondary | ICD-10-CM | POA: Diagnosis not present

## 2015-12-10 DIAGNOSIS — Z1231 Encounter for screening mammogram for malignant neoplasm of breast: Secondary | ICD-10-CM | POA: Diagnosis not present

## 2015-12-19 DIAGNOSIS — N183 Chronic kidney disease, stage 3 (moderate): Secondary | ICD-10-CM | POA: Diagnosis not present

## 2015-12-19 DIAGNOSIS — E1122 Type 2 diabetes mellitus with diabetic chronic kidney disease: Secondary | ICD-10-CM | POA: Diagnosis not present

## 2015-12-19 DIAGNOSIS — I129 Hypertensive chronic kidney disease with stage 1 through stage 4 chronic kidney disease, or unspecified chronic kidney disease: Secondary | ICD-10-CM | POA: Diagnosis not present

## 2015-12-19 DIAGNOSIS — N08 Glomerular disorders in diseases classified elsewhere: Secondary | ICD-10-CM | POA: Diagnosis not present

## 2016-01-06 DIAGNOSIS — E039 Hypothyroidism, unspecified: Secondary | ICD-10-CM | POA: Diagnosis not present

## 2016-01-13 DIAGNOSIS — Z79899 Other long term (current) drug therapy: Secondary | ICD-10-CM | POA: Diagnosis not present

## 2016-01-24 ENCOUNTER — Telehealth: Payer: Self-pay | Admitting: Hematology

## 2016-01-24 NOTE — Telephone Encounter (Signed)
left message for may 8 appt change

## 2016-01-30 DIAGNOSIS — N183 Chronic kidney disease, stage 3 (moderate): Secondary | ICD-10-CM | POA: Diagnosis not present

## 2016-01-30 DIAGNOSIS — N2581 Secondary hyperparathyroidism of renal origin: Secondary | ICD-10-CM | POA: Diagnosis not present

## 2016-02-03 ENCOUNTER — Ambulatory Visit: Payer: Commercial Managed Care - HMO | Admitting: Hematology

## 2016-02-03 ENCOUNTER — Other Ambulatory Visit (HOSPITAL_BASED_OUTPATIENT_CLINIC_OR_DEPARTMENT_OTHER): Payer: Commercial Managed Care - HMO

## 2016-02-03 ENCOUNTER — Telehealth: Payer: Self-pay | Admitting: Hematology

## 2016-02-03 ENCOUNTER — Ambulatory Visit (HOSPITAL_BASED_OUTPATIENT_CLINIC_OR_DEPARTMENT_OTHER): Payer: Commercial Managed Care - HMO | Admitting: Hematology

## 2016-02-03 ENCOUNTER — Other Ambulatory Visit: Payer: Commercial Managed Care - HMO

## 2016-02-03 ENCOUNTER — Encounter: Payer: Self-pay | Admitting: Hematology

## 2016-02-03 VITALS — BP 146/82 | HR 68 | Temp 98.2°F | Resp 18 | Ht 59.0 in | Wt 152.7 lb

## 2016-02-03 DIAGNOSIS — D472 Monoclonal gammopathy: Secondary | ICD-10-CM

## 2016-02-03 DIAGNOSIS — D649 Anemia, unspecified: Secondary | ICD-10-CM

## 2016-02-03 LAB — CBC & DIFF AND RETIC
BASO%: 0.5 % (ref 0.0–2.0)
Basophils Absolute: 0 10*3/uL (ref 0.0–0.1)
EOS%: 10.7 % — ABNORMAL HIGH (ref 0.0–7.0)
Eosinophils Absolute: 0.5 10*3/uL (ref 0.0–0.5)
HCT: 31.9 % — ABNORMAL LOW (ref 34.8–46.6)
HGB: 10.4 g/dL — ABNORMAL LOW (ref 11.6–15.9)
Immature Retic Fract: 5.5 % (ref 1.60–10.00)
LYMPH%: 23.8 % (ref 14.0–49.7)
MCH: 31.8 pg (ref 25.1–34.0)
MCHC: 32.6 g/dL (ref 31.5–36.0)
MCV: 97.6 fL (ref 79.5–101.0)
MONO#: 0.3 10*3/uL (ref 0.1–0.9)
MONO%: 8.1 % (ref 0.0–14.0)
NEUT#: 2.4 10*3/uL (ref 1.5–6.5)
NEUT%: 56.9 % (ref 38.4–76.8)
Platelets: 133 10*3/uL — ABNORMAL LOW (ref 145–400)
RBC: 3.27 10*6/uL — ABNORMAL LOW (ref 3.70–5.45)
RDW: 13.4 % (ref 11.2–14.5)
Retic %: 1.37 % (ref 0.70–2.10)
Retic Ct Abs: 44.8 10*3/uL (ref 33.70–90.70)
WBC: 4.2 10*3/uL (ref 3.9–10.3)
lymph#: 1 10*3/uL (ref 0.9–3.3)

## 2016-02-03 LAB — COMPREHENSIVE METABOLIC PANEL
ALT: 45 U/L (ref 0–55)
AST: 34 U/L (ref 5–34)
Albumin: 3.7 g/dL (ref 3.5–5.0)
Alkaline Phosphatase: 65 U/L (ref 40–150)
Anion Gap: 8 mEq/L (ref 3–11)
BUN: 17.9 mg/dL (ref 7.0–26.0)
CO2: 27 mEq/L (ref 22–29)
Calcium: 9.4 mg/dL (ref 8.4–10.4)
Chloride: 105 mEq/L (ref 98–109)
Creatinine: 1.3 mg/dL — ABNORMAL HIGH (ref 0.6–1.1)
EGFR: 48 mL/min/{1.73_m2} — ABNORMAL LOW (ref >90)
Glucose: 82 mg/dl (ref 70–140)
Potassium: 4 mEq/L (ref 3.5–5.1)
Sodium: 140 mEq/L (ref 136–145)
Total Bilirubin: 0.65 mg/dL (ref 0.20–1.20)
Total Protein: 7 g/dL (ref 6.4–8.3)

## 2016-02-03 LAB — FERRITIN: Ferritin: 45 ng/ml (ref 9–269)

## 2016-02-03 NOTE — Telephone Encounter (Signed)
Gave and printed appt sched and avs fo rpt; for NOV  °

## 2016-02-03 NOTE — Progress Notes (Signed)
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 She notes no acute new symptoms.   She continues to be on Plaquenil for her lupus. Had a recent renal ultrasound that showed cystic structures in both the kidneys. She notes that she has been referred to urology to further work this up. No new bone pains. Her lab shows stable/improving anemia. Stable creatinine. No hypercalcemia. No other acute new concerns.  REVIEW OF SYSTEMS:    10 Point review of systems of done and is negative except as noted above.  . Past Medical History  Diagnosis Date  . Hypertension   . Asthma   . Hyperlipidemia   . Arthritis   . Diabetes mellitus without complication ([place])   . Gout 12/17/2014    patient reported  possible SLE/rheumatoid arthritis following with Dr.Deveshwar.  .History reviewed. No pertinent past surgical history.  . Social History  Substance Use Topics  . Smoking status: Never Smoker   . Smokeless tobacco: Never Used  . Alcohol Use: Yes     Comment: occasional    ALLERGIES:  is allergic to shellfish allergy.  MEDICATIONS:  Current Outpatient Prescriptions  Medication Sig Dispense Refill  . allopurinol (ZYLOPRIM) 100 MG tablet Take 100 mg by mouth daily.    [name] aspirin EC 81 MG tablet Take 81 mg by mouth daily.    [name] atorvastatin (LIPITOR) 80 MG tablet Take 80 mg by mouth daily.    . Azilsartan Medoxomil 40 MG TABS Take 40 mg by mouth once.    . Azilsartan-Chlorthalidone (EDARBYCLOR) 40-12.5 MG TABS Take 1 tablet by mouth daily.    .  budesonide-formoterol (SYMBICORT) 160-4.5 MCG/ACT inhaler Take 2 puffs first thing in am and then another 2 puffs about 12 hours later. 6 g 11  . Cholecalciferol (VITAMIN D PO) Take 1 capsule by mouth daily.    . Ferrous Sulfate (IRON) 90 (18 FE) MG TABS     . hydroxychloroquine (PLAQUENIL) 200 MG tablet TK 1 T PO QD Mon-Fri  2  . metFORMIN (GLUCOPHAGE) 500 MG tablet Take 500 mg by mouth 3 (three) times daily.     [name] SYNTHROID 112 MCG tablet TK 1 T PO  QAM  2   No current facility-administered medications for this visit.    PHYSICAL EXAMINATION: ECOG PERFORMANCE STATUS: 1 - Symptomatic but completely ambulatory  . Filed Vitals:   02/03/16 0936  BP: 146/82  Pulse: 68  Temp: 98.2 F (36.8 C)  Resp: 18    Filed Weights   02/03/16 0936  Weight: 152 lb 11.2 oz (69.264 kg)   .Body mass index is 30.83 kg/(m^2).  GENERAL:alert, in no acute distress and comfortable SKIN: skin color, texture, turgor are normal, no rashes or significant lesions EYES: normal, conjunctiva are pink and non-injected, sclera clear OROPHARYNX:no exudate, no erythema and lips, buccal mucosa, and tongue normal  NECK: supple, no JVD, thyroid normal size, non-tender, without nodularity LYMPH:  no palpable lymphadenopathy in the cervical, axillary or inguinal LUNGS: clear to auscultation with normal respiratory effort HEART: regular rate &  rhythm,  no murmurs and no lower extremity edema ABDOMEN: abdomen soft, non-tender, normoactive bowel sounds  Musculoskeletal: no cyanosis of digits and no clubbing  PSYCH: alert & oriented x 3 with fluent speech NEURO: no focal motor/sensory deficits  LABORATORY DATA:   I have reviewed the data as listed  . CBC Latest Ref Rng 02/03/2016 11/04/2015 07/24/2015  WBC 3.9 - 10.3 10e3/uL 4.2 4.6 5.3  Hemoglobin 11.6 - 15.9 g/dL 10.4(L) 9.5(L) 9.8(L)  Hematocrit 34.8 - 46.6 % 31.9(L) 29.3(L) 30.7(L)  Platelets 145 - 400 10e3/uL 133(L) 135(L) 150    . CMP Latest Ref Rng  02/03/2016 11/04/2015 11/04/2015  Glucose 70 - 140 mg/dl 82 89 -  BUN 7.0 - 26.0 mg/dL 17.9 22.1 -  Creatinine 0.6 - 1.1 mg/dL 1.3(H) 1.4(H) -  Sodium 136 - 145 mEq/L 140 142 -  Potassium 3.5 - 5.1 mEq/L 4.0 4.1 -  Chloride 101 - 111 mmol/L - - -  CO2 22 - 29 mEq/L 27 26 -  Calcium 8.4 - 10.4 mg/dL 9.4 9.0 -  Total Protein 6.4 - 8.3 g/dL 7.0 6.9 6.6  Total Bilirubin 0.20 - 1.20 mg/dL 0.65 0.54 -  Alkaline Phos 40 - 150 U/L 65 57 -  AST 5 - 34 U/L 34 26 -  ALT 0 - 55 U/L 45 26 -  .   Lab Results  Component Value Date   FERRITIN 45 02/03/2016    Component     Latest Ref Rng 07/03/2015 11/04/2015  IgG (Immunoglobin G), Serum     700 - 1600 mg/dL 1530 1,251  IgA     69 - 380 mg/dL 204   IgM, Serum     52 - 322 mg/dL 61   Immunofix Electr Int      *   Total Protein, Serum Electrophoresis     6.1 - 8.1 g/dL 6.5   Albumin ELP     3.8 - 4.8 g/dL 3.4 (L)   Alpha-1-Globulin     0.2 - 0.3 g/dL 0.3   Alpha-2-Globulin     0.5 - 0.9 g/dL 0.7   Beta Globulin     0.4 - 0.6 g/dL 0.4   Beta 2     0.2 - 0.5 g/dL 0.3   Gamma Globulin     0.8 - 1.7 g/dL 1.4   Abnormal Protein Band1      0.6   SPE Interp.      *   COMMENT (PROTEIN ELECTROPHOR)      *   Abnormal Protein Band2      NOT DET   Abnormal Protein Band3      NOT DET   IgA/Immunoglobulin A, Serum     87 - 352 mg/dL  183  IgM, Qn, Serum     26 - 217 mg/dL  43  Total Protein     6.0 - 8.5 g/dL  6.6  Albumin SerPl Elph-Mcnc     2.9 - 4.4 g/dL  3.6  Alpha 1     0.0 - 0.4 g/dL  0.2  Alpha2 Glob SerPl Elph-Mcnc     0.4 - 1.0 g/dL  0.6  B-Globulin SerPl Elph-Mcnc     0.7 - 1.3 g/dL  0.9  Gamma Glob SerPl Elph-Mcnc     0.4 - 1.8 g/dL  1.3  M Protein SerPl Elph-Mcnc     Not Observed g/dL  0.4 (H)  Globulin, Total     2.2 - 3.9 g/dL  3.0  Albumin/Glob SerPl  0.7 - 1.7  1.3  IFE 1       Comment    Comment        RADIOGRAPHIC STUDIES: I have personally reviewed the radiological images as listed and agreed with  the findings in the report. No results found. METASTATIC BONE SURVEY 0/02/2015  COMPARISON: None.  FINDINGS: Heart is normal size. Right diaphragmatic hernia again noted, unchanged. Lungs are clear. No effusions.  No focal lytic lesions within the visualized bony structures or acute bony abnormality. Degenerative changes within the shoulders, lower cervical spine, mid to lower thoracic spine, and lower lumbar spine. Advanced degenerative facet disease in the lower lumbar spine. 9 mm of anterolisthesis of L4 on L5. Endplate sclerosis within L4 and L5. Mild degenerative changes in the hips. Advanced degenerative changes within the knees. 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  IMPRESSION: No focal lytic lesion.  Degenerative joint disease involving multiple joints as described above.  Grade 2 anterolisthesis of L4 on L5 related to facet disease.   ASSESSMENT & PLAN:  70 year old of an American female with  #1 IgG kappa monoclonal gammopathy of undetermined significance. Bone survey X-ray showed no lytic lesions. M spike is 0.6 g/dL in 10/016 and then 0.5g/dl in 10/2015. Urine IFE shows some Bence-Jones protein. Serum free light chain ratio shows lambda chain predominance. MGUS related to underlying connective tissue disorder.  Bone marrow biopsy does show about 13% plasma cells however these appear to be polyclonal and likely related to a possible underlying inflammatory disorder. Less likely a biclonal  Plasma cell dyscrasia.  #2 Normocytic normochromic anemia likely related to chronic inflammation from recent diagnosis of lupus/rheumatoid arthritis + CKD. Patient's sedimentation rate and CRP are unimpressive at this time.B12 within normal limits. Ferritin 35. Ferritin today still 45 despite increasing po iron intake. Increased acanthocytes on peripheral blood smear -no overt evidence of liver disease.  Bone marrow biopsy did not show overt plasma  cell dyscrasia or myelodysplastic syndrome. 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 Bone marrow biopsy showed decreased iron stores.  #3 mild eosinophilia likely related to her connective tissue disorder. 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 Estanislado Pandy. Plan -Patient's anemia is somewhat improved. Ferritin still below 50. -She has no symptoms or findings suggestive of multiple myeloma at this time. --Follow-up on pending labs from today SPEP with QIG/IFE -results pending --The nurse will call the patient to offer her IV Feraheme she shall wishes based on our clinic discussion today. If she is agreeable we'll set up for IV Feraheme 510 mg daily daily 2 doses. Alternatively she'll continue her ferrous sulfate 1 tablet by mouth twice a day with a target of ferritin more than 100. -In the future once ferritin is more than 100 if the patient still becomes anemic might consider Aranesp. -Patient will be seeing urology for further workup of her renal cysts. -Continue follow-up with primary care physician and rheumatology.  RTC with Dr. Irene Limbo in 6 months with cbc, cmp ferritin and SPEP/QIG/IFE  . Orders Placed This Encounter  Procedures  . CBC & Diff and Retic    Standing Status: Future     Number of Occurrences:      Standing Expiration Date: 03/09/2017  . Comprehensive metabolic panel    Standing Status: Future     Number of Occurrences:      Standing Expiration Date: 02/02/2017  .  Multiple Myeloma Panel (SPEP&IFE w/QIG)    Standing Status: Future     Number of Occurrences:      Standing Expiration Date: 02/02/2017  . Ferritin    Standing Status: Future     Number of Occurrences:      Standing Expiration Date: 02/02/2017    I spent 15 minutes counseling the patient face to face. The total time spent in the appointment was 20 minutes and more than 50% was on  counseling and direct patient cares.    Sullivan Lone MD [place] AAHIVMS [facility] Hematology/Oncology Physician [facility]  (Office):       [phone] (Work cell):  [phone] (Fax):           [phone]

## 2016-02-04 ENCOUNTER — Telehealth: Payer: Self-pay | Admitting: *Deleted

## 2016-02-04 NOTE — Telephone Encounter (Signed)
Per Dr. Irene Limbo, lvm with patient asking for call back to discuss lab results.  Ferritin level 45, would be reasonable to more forward with Iron infusion if pt willing.  Otherwise pt should continue on ferrous sulfate 1t po daily.  Will wait for call back from patient.   Pt returned phone call stating she would like to move forward with iron infusion.  Dr. Irene Limbo notified.  Told pt she should be expecting phone call from scheduling to set up appointment.  Pt verbalized understanding.

## 2016-02-05 ENCOUNTER — Other Ambulatory Visit: Payer: Self-pay | Admitting: *Deleted

## 2016-02-05 ENCOUNTER — Other Ambulatory Visit: Payer: Self-pay | Admitting: Hematology

## 2016-02-05 ENCOUNTER — Telehealth: Payer: Self-pay | Admitting: Hematology

## 2016-02-05 DIAGNOSIS — D649 Anemia, unspecified: Secondary | ICD-10-CM

## 2016-02-05 NOTE — Telephone Encounter (Signed)
s.w pt and advised on May appt.....pt ok and aware °

## 2016-02-06 LAB — MULTIPLE MYELOMA PANEL, SERUM
Albumin SerPl Elph-Mcnc: 3.6 g/dL (ref 2.9–4.4)
Albumin/Glob SerPl: 1.2 (ref 0.7–1.7)
Alpha 1: 0.2 g/dL (ref 0.0–0.4)
Alpha2 Glob SerPl Elph-Mcnc: 0.7 g/dL (ref 0.4–1.0)
B-Globulin SerPl Elph-Mcnc: 0.8 g/dL (ref 0.7–1.3)
Gamma Glob SerPl Elph-Mcnc: 1.3 g/dL (ref 0.4–1.8)
Globulin, Total: 3.1 g/dL (ref 2.2–3.9)
IgA, Qn, Serum: 174 mg/dL (ref 87–352)
IgG, Qn, Serum: 1313 mg/dL (ref 700–1600)
IgM, Qn, Serum: 38 mg/dL (ref 26–217)
M Protein SerPl Elph-Mcnc: 0.3 g/dL — ABNORMAL HIGH
Total Protein: 6.7 g/dL (ref 6.0–8.5)

## 2016-02-07 ENCOUNTER — Ambulatory Visit (HOSPITAL_BASED_OUTPATIENT_CLINIC_OR_DEPARTMENT_OTHER): Payer: Commercial Managed Care - HMO

## 2016-02-07 VITALS — BP 140/61 | HR 58 | Temp 98.6°F | Resp 16

## 2016-02-07 DIAGNOSIS — D508 Other iron deficiency anemias: Secondary | ICD-10-CM | POA: Diagnosis not present

## 2016-02-07 MED ORDER — SODIUM CHLORIDE 0.9 % IV SOLN
510.0000 mg | Freq: Once | INTRAVENOUS | Status: AC
  Administered 2016-02-07: 510 mg via INTRAVENOUS
  Filled 2016-02-07: qty 17

## 2016-02-07 MED ORDER — SODIUM CHLORIDE 0.9 % IV SOLN
Freq: Once | INTRAVENOUS | Status: AC
  Administered 2016-02-07: 15:00:00 via INTRAVENOUS

## 2016-02-07 NOTE — Patient Instructions (Signed)

## 2016-02-14 ENCOUNTER — Ambulatory Visit (HOSPITAL_BASED_OUTPATIENT_CLINIC_OR_DEPARTMENT_OTHER): Payer: Commercial Managed Care - HMO

## 2016-02-14 VITALS — BP 113/52 | HR 61 | Temp 98.3°F | Resp 18

## 2016-02-14 DIAGNOSIS — D508 Other iron deficiency anemias: Secondary | ICD-10-CM

## 2016-02-14 MED ORDER — SODIUM CHLORIDE 0.9 % IV SOLN
Freq: Once | INTRAVENOUS | Status: AC
  Administered 2016-02-14: 15:00:00 via INTRAVENOUS

## 2016-02-14 MED ORDER — SODIUM CHLORIDE 0.9 % IV SOLN
510.0000 mg | Freq: Once | INTRAVENOUS | Status: AC
  Administered 2016-02-14: 510 mg via INTRAVENOUS
  Filled 2016-02-14: qty 17

## 2016-02-14 NOTE — Patient Instructions (Signed)

## 2016-02-17 DIAGNOSIS — M0579 Rheumatoid arthritis with rheumatoid factor of multiple sites without organ or systems involvement: Secondary | ICD-10-CM | POA: Diagnosis not present

## 2016-02-17 DIAGNOSIS — M329 Systemic lupus erythematosus, unspecified: Secondary | ICD-10-CM | POA: Diagnosis not present

## 2016-02-17 DIAGNOSIS — D638 Anemia in other chronic diseases classified elsewhere: Secondary | ICD-10-CM | POA: Diagnosis not present

## 2016-02-17 DIAGNOSIS — Z09 Encounter for follow-up examination after completed treatment for conditions other than malignant neoplasm: Secondary | ICD-10-CM | POA: Diagnosis not present

## 2016-02-28 ENCOUNTER — Other Ambulatory Visit: Payer: Self-pay | Admitting: Urology

## 2016-02-28 DIAGNOSIS — N281 Cyst of kidney, acquired: Secondary | ICD-10-CM | POA: Diagnosis not present

## 2016-03-06 ENCOUNTER — Ambulatory Visit (HOSPITAL_COMMUNITY)
Admission: RE | Admit: 2016-03-06 | Discharge: 2016-03-06 | Disposition: A | Discharge status: Home / Self Care | Payer: Commercial Managed Care - HMO | Source: Ambulatory Visit | Attending: Urology | Admitting: Urology

## 2016-03-06 DIAGNOSIS — R932 Abnormal findings on diagnostic imaging of liver and biliary tract: Secondary | ICD-10-CM | POA: Diagnosis not present

## 2016-03-06 DIAGNOSIS — M899 Disorder of bone, unspecified: Secondary | ICD-10-CM | POA: Diagnosis not present

## 2016-03-06 DIAGNOSIS — M47816 Spondylosis without myelopathy or radiculopathy, lumbar region: Secondary | ICD-10-CM | POA: Diagnosis not present

## 2016-03-06 DIAGNOSIS — I517 Cardiomegaly: Secondary | ICD-10-CM | POA: Insufficient documentation

## 2016-03-06 DIAGNOSIS — N289 Disorder of kidney and ureter, unspecified: Secondary | ICD-10-CM | POA: Diagnosis not present

## 2016-03-06 DIAGNOSIS — K449 Diaphragmatic hernia without obstruction or gangrene: Secondary | ICD-10-CM | POA: Diagnosis not present

## 2016-03-06 DIAGNOSIS — R938 Abnormal findings on diagnostic imaging of other specified body structures: Secondary | ICD-10-CM | POA: Diagnosis not present

## 2016-03-06 DIAGNOSIS — N281 Cyst of kidney, acquired: Secondary | ICD-10-CM

## 2016-03-06 DIAGNOSIS — M5136 Other intervertebral disc degeneration, lumbar region: Secondary | ICD-10-CM | POA: Insufficient documentation

## 2016-03-06 MED ORDER — GADOBENATE DIMEGLUMINE 529 MG/ML IV SOLN
14.0000 mL | Freq: Once | INTRAVENOUS | Status: AC | PRN
  Administered 2016-03-06: 14 mL via INTRAVENOUS

## 2016-03-10 DIAGNOSIS — N183 Chronic kidney disease, stage 3 (moderate): Secondary | ICD-10-CM | POA: Diagnosis not present

## 2016-03-10 DIAGNOSIS — N08 Glomerular disorders in diseases classified elsewhere: Secondary | ICD-10-CM | POA: Diagnosis not present

## 2016-03-10 DIAGNOSIS — E039 Hypothyroidism, unspecified: Secondary | ICD-10-CM | POA: Diagnosis not present

## 2016-03-10 DIAGNOSIS — E559 Vitamin D deficiency, unspecified: Secondary | ICD-10-CM | POA: Diagnosis not present

## 2016-03-10 DIAGNOSIS — E1122 Type 2 diabetes mellitus with diabetic chronic kidney disease: Secondary | ICD-10-CM | POA: Diagnosis not present

## 2016-03-10 DIAGNOSIS — Z0000 Encounter for general adult medical examination without abnormal findings: Secondary | ICD-10-CM | POA: Diagnosis not present

## 2016-03-10 DIAGNOSIS — I129 Hypertensive chronic kidney disease with stage 1 through stage 4 chronic kidney disease, or unspecified chronic kidney disease: Secondary | ICD-10-CM | POA: Diagnosis not present

## 2016-03-12 DIAGNOSIS — E039 Hypothyroidism, unspecified: Secondary | ICD-10-CM | POA: Diagnosis not present

## 2016-04-02 DIAGNOSIS — N281 Cyst of kidney, acquired: Secondary | ICD-10-CM | POA: Diagnosis not present

## 2016-04-14 DIAGNOSIS — M25562 Pain in left knee: Secondary | ICD-10-CM | POA: Diagnosis not present

## 2016-04-14 DIAGNOSIS — M2392 Unspecified internal derangement of left knee: Secondary | ICD-10-CM | POA: Diagnosis not present

## 2016-04-14 DIAGNOSIS — M1712 Unilateral primary osteoarthritis, left knee: Secondary | ICD-10-CM | POA: Diagnosis not present

## 2016-05-05 DIAGNOSIS — M1711 Unilateral primary osteoarthritis, right knee: Secondary | ICD-10-CM | POA: Diagnosis not present

## 2016-05-05 DIAGNOSIS — M1712 Unilateral primary osteoarthritis, left knee: Secondary | ICD-10-CM | POA: Diagnosis not present

## 2016-05-05 DIAGNOSIS — M25561 Pain in right knee: Secondary | ICD-10-CM | POA: Diagnosis not present

## 2016-05-05 DIAGNOSIS — M25562 Pain in left knee: Secondary | ICD-10-CM | POA: Diagnosis not present

## 2016-05-21 DIAGNOSIS — R5381 Other malaise: Secondary | ICD-10-CM | POA: Diagnosis not present

## 2016-05-21 DIAGNOSIS — M2550 Pain in unspecified joint: Secondary | ICD-10-CM | POA: Diagnosis not present

## 2016-05-21 DIAGNOSIS — Z79899 Other long term (current) drug therapy: Secondary | ICD-10-CM | POA: Diagnosis not present

## 2016-05-21 DIAGNOSIS — R52 Pain, unspecified: Secondary | ICD-10-CM | POA: Diagnosis not present

## 2016-05-21 DIAGNOSIS — R300 Dysuria: Secondary | ICD-10-CM | POA: Diagnosis not present

## 2016-05-21 DIAGNOSIS — E559 Vitamin D deficiency, unspecified: Secondary | ICD-10-CM | POA: Diagnosis not present

## 2016-07-15 ENCOUNTER — Ambulatory Visit (INDEPENDENT_AMBULATORY_CARE_PROVIDER_SITE_OTHER): Payer: Commercial Managed Care - HMO | Admitting: Rheumatology

## 2016-07-15 DIAGNOSIS — M25562 Pain in left knee: Secondary | ICD-10-CM

## 2016-07-15 DIAGNOSIS — M329 Systemic lupus erythematosus, unspecified: Secondary | ICD-10-CM | POA: Diagnosis not present

## 2016-07-15 DIAGNOSIS — M0579 Rheumatoid arthritis with rheumatoid factor of multiple sites without organ or systems involvement: Secondary | ICD-10-CM

## 2016-07-15 DIAGNOSIS — M1A00X0 Idiopathic chronic gout, unspecified site, without tophus (tophi): Secondary | ICD-10-CM

## 2016-07-15 DIAGNOSIS — Z79899 Other long term (current) drug therapy: Secondary | ICD-10-CM | POA: Diagnosis not present

## 2016-07-16 DIAGNOSIS — E1122 Type 2 diabetes mellitus with diabetic chronic kidney disease: Secondary | ICD-10-CM | POA: Diagnosis not present

## 2016-07-16 DIAGNOSIS — N08 Glomerular disorders in diseases classified elsewhere: Secondary | ICD-10-CM | POA: Diagnosis not present

## 2016-07-16 DIAGNOSIS — N183 Chronic kidney disease, stage 3 (moderate): Secondary | ICD-10-CM | POA: Diagnosis not present

## 2016-07-16 DIAGNOSIS — I129 Hypertensive chronic kidney disease with stage 1 through stage 4 chronic kidney disease, or unspecified chronic kidney disease: Secondary | ICD-10-CM | POA: Diagnosis not present

## 2016-07-17 DIAGNOSIS — Z124 Encounter for screening for malignant neoplasm of cervix: Secondary | ICD-10-CM | POA: Diagnosis not present

## 2016-07-17 DIAGNOSIS — Z01419 Encounter for gynecological examination (general) (routine) without abnormal findings: Secondary | ICD-10-CM | POA: Diagnosis not present

## 2016-07-27 ENCOUNTER — Encounter: Payer: Self-pay | Admitting: Internal Medicine

## 2016-07-27 DIAGNOSIS — M810 Age-related osteoporosis without current pathological fracture: Secondary | ICD-10-CM | POA: Diagnosis not present

## 2016-07-27 DIAGNOSIS — M85852 Other specified disorders of bone density and structure, left thigh: Secondary | ICD-10-CM | POA: Diagnosis not present

## 2016-07-29 ENCOUNTER — Telehealth: Payer: Self-pay | Admitting: Radiology

## 2016-07-29 NOTE — Telephone Encounter (Signed)
DEXA received shows Tscore -3.2 previous -2.9 in Oct 2015 / her GFR is low, about 50. I do not see any treatment recommendations in [place] regarding her bone density  She has appointment in March

## 2016-07-30 NOTE — Telephone Encounter (Signed)
Please reschedule earlier appointment if possible

## 2016-07-30 NOTE — Telephone Encounter (Signed)
I have called patient to advise her DEXA has gotten worse, she would like to discuss with Dr Baird Cancer first. If she would like sooner appt, she will let us know

## 2016-08-03 ENCOUNTER — Other Ambulatory Visit (HOSPITAL_BASED_OUTPATIENT_CLINIC_OR_DEPARTMENT_OTHER): Payer: Commercial Managed Care - HMO

## 2016-08-03 ENCOUNTER — Telehealth: Payer: Self-pay | Admitting: Hematology

## 2016-08-03 ENCOUNTER — Ambulatory Visit (HOSPITAL_BASED_OUTPATIENT_CLINIC_OR_DEPARTMENT_OTHER): Payer: Commercial Managed Care - HMO | Admitting: Hematology

## 2016-08-03 ENCOUNTER — Encounter: Payer: Self-pay | Admitting: Hematology

## 2016-08-03 VITALS — BP 152/53 | HR 60 | Temp 97.8°F | Resp 18 | Ht 59.0 in | Wt 163.4 lb

## 2016-08-03 DIAGNOSIS — D472 Monoclonal gammopathy: Secondary | ICD-10-CM | POA: Diagnosis not present

## 2016-08-03 DIAGNOSIS — D649 Anemia, unspecified: Secondary | ICD-10-CM

## 2016-08-03 LAB — CBC & DIFF AND RETIC
BASO%: 0.7 % (ref 0.0–2.0)
Basophils Absolute: 0 10*3/uL (ref 0.0–0.1)
EOS%: 12.3 % — ABNORMAL HIGH (ref 0.0–7.0)
Eosinophils Absolute: 0.6 10*3/uL — ABNORMAL HIGH (ref 0.0–0.5)
HCT: 30.5 % — ABNORMAL LOW (ref 34.8–46.6)
HGB: 9.7 g/dL — ABNORMAL LOW (ref 11.6–15.9)
Immature Retic Fract: 9.6 % (ref 1.60–10.00)
LYMPH%: 19.2 % (ref 14.0–49.7)
MCH: 31.4 pg (ref 25.1–34.0)
MCHC: 31.8 g/dL (ref 31.5–36.0)
MCV: 98.7 fL (ref 79.5–101.0)
MONO#: 0.3 10*3/uL (ref 0.1–0.9)
MONO%: 6.5 % (ref 0.0–14.0)
NEUT#: 2.7 10*3/uL (ref 1.5–6.5)
NEUT%: 61.3 % (ref 38.4–76.8)
Platelets: 137 10*3/uL — ABNORMAL LOW (ref 145–400)
RBC: 3.09 10*6/uL — ABNORMAL LOW (ref 3.70–5.45)
RDW: 13.7 % (ref 11.2–14.5)
Retic %: 1.76 % (ref 0.70–2.10)
Retic Ct Abs: 54.38 10*3/uL (ref 33.70–90.70)
WBC: 4.5 10*3/uL (ref 3.9–10.3)
lymph#: 0.9 10*3/uL (ref 0.9–3.3)

## 2016-08-03 LAB — COMPREHENSIVE METABOLIC PANEL
ALT: 34 U/L (ref 0–55)
AST: 29 U/L (ref 5–34)
Albumin: 3.6 g/dL (ref 3.5–5.0)
Alkaline Phosphatase: 79 U/L (ref 40–150)
Anion Gap: 9 mEq/L (ref 3–11)
BUN: 23.1 mg/dL (ref 7.0–26.0)
CO2: 24 mEq/L (ref 22–29)
Calcium: 9.2 mg/dL (ref 8.4–10.4)
Chloride: 109 mEq/L (ref 98–109)
Creatinine: 1.2 mg/dL — ABNORMAL HIGH (ref 0.6–1.1)
EGFR: 53 mL/min/{1.73_m2} — ABNORMAL LOW (ref >90)
Glucose: 89 mg/dl (ref 70–140)
Potassium: 3.9 mEq/L (ref 3.5–5.1)
Sodium: 142 mEq/L (ref 136–145)
Total Bilirubin: 0.51 mg/dL (ref 0.20–1.20)
Total Protein: 7.1 g/dL (ref 6.4–8.3)

## 2016-08-03 LAB — FERRITIN: Ferritin: 109 ng/ml (ref 9–269)

## 2016-08-03 NOTE — Telephone Encounter (Signed)
Gave patient avs report and appointments for May 2018.

## 2016-08-03 NOTE — Progress Notes (Signed)
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 Privette is here for followup regarding workup of her MGUS and anemia. She notes no acute new symptoms.  She has been told that she has osteoporosis which will need to be addressed by her primary care physician/rheumatologist. She continues to be on Plaquenil for her lupus/RA.  She saw Dr. Nicolette Bang from urology and had an MRI of the abdomen to evaluate her renal cysts which were thought to be benign. Hemosiderosis-like changes noted in the liver and spleen likely from her iron infusion at the time. No new bone pains. Her lab shows stable anemia. Stable creatinine. No hypercalcemia. No other acute new concerns.  REVIEW OF SYSTEMS:    10 Point review of systems of done and is negative except as noted above.  . Past Medical History:  Diagnosis Date  . Arthritis   . Asthma   . Diabetes mellitus without complication ([place])   . Gout 12/17/2014   patient reported  . Hyperlipidemia   . Hypertension   possible SLE/rheumatoid arthritis following with Dr.Deveshwar.  .History reviewed. No pertinent surgical history.  . Social History  Substance Use Topics  . Smoking status: Never Smoker  . Smokeless tobacco: Never Used  . Alcohol use Yes     Comment: occasional    ALLERGIES:  is allergic to shellfish allergy.  MEDICATIONS:  Current Outpatient Prescriptions  Medication Sig Dispense Refill  . allopurinol (ZYLOPRIM) 100 MG tablet Take 100 mg by mouth daily.    [name] aspirin EC 81 MG tablet Take 81 mg by mouth daily.    [name] atorvastatin (LIPITOR) 80 MG  tablet Take 80 mg by mouth daily.    . Azilsartan-Chlorthalidone (EDARBYCLOR) 40-12.5 MG TABS Take 1 tablet by mouth daily.    . budesonide-formoterol (SYMBICORT) 160-4.5 MCG/ACT inhaler Take 2 puffs first thing in am and then another 2 puffs about 12 hours later. 6 g 11  . Cholecalciferol (VITAMIN D PO) Take 1 capsule by mouth daily.    . Ferrous Sulfate (IRON) 90 (18 FE) MG TABS     . hydroxychloroquine (PLAQUENIL) 200 MG tablet TK 1 T PO QD Mon-Fri  2  . metFORMIN (GLUCOPHAGE) 500 MG tablet Take 500 mg by mouth 3 (three) times daily.     [name] SYNTHROID 112 MCG tablet TK 1 T PO  QAM  2   No current facility-administered medications for this visit.     PHYSICAL EXAMINATION: ECOG PERFORMANCE STATUS: 1 - Symptomatic but completely ambulatory  . Vitals:   08/03/16 0901  BP: (!) 152/53  Pulse: 60  Resp: 18  Temp: 97.8 F (36.6 C)    Filed Weights   08/03/16 0901  Weight: 163 lb 6.4 oz (74.1 kg)   .Body mass index is 33 kg/m.  GENERAL:alert, in no acute distress and comfortable SKIN: skin color, texture, turgor are normal, no rashes or significant lesions EYES: normal, conjunctiva are pink and non-injected, sclera clear OROPHARYNX:no exudate, no erythema and lips, buccal mucosa, and tongue normal  NECK: supple, no JVD, thyroid normal size, non-tender, without nodularity LYMPH:  no palpable lymphadenopathy in  the cervical, axillary or inguinal LUNGS: clear to auscultation with normal respiratory effort HEART: regular rate & rhythm,  no murmurs and no lower extremity edema ABDOMEN: abdomen soft, non-tender, normoactive bowel sounds  Musculoskeletal: no cyanosis of digits and no clubbing  PSYCH: alert & oriented x 3 with fluent speech NEURO: no focal motor/sensory deficits  LABORATORY DATA:   I have reviewed the data as listed  . CBC Latest Ref Rng & Units 08/03/2016 02/03/2016 11/04/2015  WBC 3.9 - 10.3 10e3/uL 4.5 4.2 4.6  Hemoglobin 11.6 - 15.9 g/dL 9.7(L) 10.4(L) 9.5(L)    Hematocrit 34.8 - 46.6 % 30.5(L) 31.9(L) 29.3(L)  Platelets 145 - 400 10e3/uL 137(L) 133(L) 135(L)    . CMP Latest Ref Rng & Units 08/03/2016 02/03/2016 02/03/2016  Glucose 70 - 140 mg/dl 89 82 -  BUN 7.0 - 26.0 mg/dL 23.1 17.9 -  Creatinine 0.6 - 1.1 mg/dL 1.2(H) 1.3(H) -  Sodium 136 - 145 mEq/L 142 140 -  Potassium 3.5 - 5.1 mEq/L 3.9 4.0 -  Chloride 101 - 111 mmol/L - - -  CO2 22 - 29 mEq/L 24 27 -  Calcium 8.4 - 10.4 mg/dL 9.2 9.4 -  Total Protein 6.4 - 8.3 g/dL 7.1 7.0 6.7  Total Bilirubin 0.20 - 1.20 mg/dL 0.51 0.65 -  Alkaline Phos 40 - 150 U/L 79 65 -  AST 5 - 34 U/L 29 34 -  ALT 0 - 55 U/L 34 45 -  .   Lab Results  Component Value Date   FERRITIN 45 02/03/2016        RADIOGRAPHIC STUDIES: I have personally reviewed the radiological images as listed and agreed with the findings in the report. No results found. METASTATIC BONE SURVEY 0/02/2015  COMPARISON: None.  FINDINGS: Heart is normal size. Right diaphragmatic hernia again noted, unchanged. Lungs are clear. No effusions.  No focal lytic lesions within the visualized bony structures or acute bony abnormality. Degenerative changes within the shoulders, lower cervical spine, mid to lower thoracic spine, and lower lumbar spine. Advanced degenerative facet disease in the lower lumbar spine. 9 mm of anterolisthesis of L4 on L5. Endplate sclerosis within L4 and L5. Mild degenerative changes in the hips. Advanced degenerative changes within the knees. 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  IMPRESSION: No focal lytic lesion.  Degenerative joint disease involving multiple joints as described above.  Grade 2 anterolisthesis of L4 on L5 related to facet disease.   ASSESSMENT & PLAN:  70 year old of an American female with  #1 IgG kappa monoclonal gammopathy of undetermined significance. Bone survey X-ray showed no lytic lesions. M spike is 0.6 g/dL in 10/016 and then 0.5g/dl in  10/2015. SPEP in May/2017 showed M protein had reduced to 0.3 g/dL.   MGUS likely related to underlying connective tissue disorder.  Bone marrow biopsy does show about 13% plasma cells however these appear to be polyclonal and likely related to a possible underlying inflammatory disorder. Less likely a biclonal  Plasma cell dyscrasia.  #2 Normocytic normochromic anemia likely related to chronic inflammation from recent diagnosis of lupus/rheumatoid arthritis + CKD. Patient's sedimentation rate and CRP are unimpressive at this time.B12 within normal limits. Ferritin 35. Ferritin today still 45 despite increasing po iron intake. Increased acanthocytes on peripheral blood smear -no overt evidence of liver disease. Could be a marker of some MDS  Bone marrow biopsy did not show overt plasma cell dyscrasia or myelodysplastic syndrome. She has single cell 5Q deletion which does not  appear to be clonal or represent a 5Q deletion MDS at this time. Bone marrow biopsy showed decreased iron stores.  #3 mild eosinophilia likely related to her connective tissue disorder. 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 Estanislado Pandy. Plan -Continue optimization of her autoimmune disorder treatment with rheumatology Dr. Estanislado Pandy. -Needs treatment of her osteoporosis by her primary care physician.  -Awaiting ferritin levels. If more the 100 could cut down her ferrous sulfate to 1 tablet by mouth daily. -She has no symptoms or findings suggestive of multiple myeloma at this time. --will Follow-up on pending labs from today SPEP with QIG/IFE -results pending  -Continue follow-up with primary care physician and rheumatology.  RTC with Dr. Irene Limbo in 6 months with cbc, cmp ferritin and SPEP/QIG/IFE  . No orders of the defined types were placed in this encounter.   I spent 15 minutes counseling the patient face to face. The total time spent in the appointment was  20 minutes and more than 50% was on counseling and direct patient cares.    Sullivan Lone MD [place] AAHIVMS [facility] Hematology/Oncology Physician [facility]  (Office):       [phone] (Work cell):  [phone] (Fax):           [phone]

## 2016-08-04 DIAGNOSIS — E039 Hypothyroidism, unspecified: Secondary | ICD-10-CM | POA: Diagnosis not present

## 2016-08-06 LAB — MULTIPLE MYELOMA PANEL, SERUM
Albumin SerPl Elph-Mcnc: 3.8 g/dL (ref 2.9–4.4)
Albumin/Glob SerPl: 1.4 (ref 0.7–1.7)
Alpha 1: 0.2 g/dL (ref 0.0–0.4)
Alpha2 Glob SerPl Elph-Mcnc: 0.7 g/dL (ref 0.4–1.0)
B-Globulin SerPl Elph-Mcnc: 0.9 g/dL (ref 0.7–1.3)
Gamma Glob SerPl Elph-Mcnc: 1.1 g/dL (ref 0.4–1.8)
Globulin, Total: 2.9 g/dL (ref 2.2–3.9)
IgA, Qn, Serum: 178 mg/dL (ref 87–352)
IgG, Qn, Serum: 1126 mg/dL (ref 700–1600)
IgM, Qn, Serum: 33 mg/dL (ref 26–217)
M Protein SerPl Elph-Mcnc: 0.3 g/dL — ABNORMAL HIGH
Total Protein: 6.7 g/dL (ref 6.0–8.5)

## 2016-08-12 DIAGNOSIS — H04123 Dry eye syndrome of bilateral lacrimal glands: Secondary | ICD-10-CM | POA: Diagnosis not present

## 2016-08-12 DIAGNOSIS — Z961 Presence of intraocular lens: Secondary | ICD-10-CM | POA: Diagnosis not present

## 2016-08-12 DIAGNOSIS — Z09 Encounter for follow-up examination after completed treatment for conditions other than malignant neoplasm: Secondary | ICD-10-CM | POA: Diagnosis not present

## 2016-08-12 DIAGNOSIS — E119 Type 2 diabetes mellitus without complications: Secondary | ICD-10-CM | POA: Diagnosis not present

## 2016-08-12 DIAGNOSIS — Z79899 Other long term (current) drug therapy: Secondary | ICD-10-CM | POA: Diagnosis not present

## 2016-08-12 DIAGNOSIS — M329 Systemic lupus erythematosus, unspecified: Secondary | ICD-10-CM | POA: Diagnosis not present

## 2016-08-28 DIAGNOSIS — J209 Acute bronchitis, unspecified: Secondary | ICD-10-CM | POA: Diagnosis not present

## 2016-09-17 DIAGNOSIS — E039 Hypothyroidism, unspecified: Secondary | ICD-10-CM | POA: Diagnosis not present

## 2016-09-17 DIAGNOSIS — E119 Type 2 diabetes mellitus without complications: Secondary | ICD-10-CM | POA: Diagnosis not present

## 2016-09-23 ENCOUNTER — Other Ambulatory Visit: Payer: Self-pay | Admitting: Rheumatology

## 2016-09-23 DIAGNOSIS — M069 Rheumatoid arthritis, unspecified: Secondary | ICD-10-CM

## 2016-09-23 DIAGNOSIS — R05 Cough: Secondary | ICD-10-CM | POA: Diagnosis not present

## 2016-09-24 ENCOUNTER — Ambulatory Visit
Admission: RE | Admit: 2016-09-24 | Discharge: 2016-09-24 | Disposition: A | Discharge status: Home / Self Care | Payer: Commercial Managed Care - HMO | Source: Ambulatory Visit | Attending: Nurse Practitioner | Admitting: Nurse Practitioner

## 2016-09-24 ENCOUNTER — Other Ambulatory Visit: Payer: Self-pay | Admitting: Nurse Practitioner

## 2016-09-24 DIAGNOSIS — R053 Chronic cough: Secondary | ICD-10-CM

## 2016-09-24 DIAGNOSIS — R05 Cough: Secondary | ICD-10-CM

## 2016-09-24 NOTE — Telephone Encounter (Signed)
Last Visit: 07/15/16 Next Visit: 12/15/16 Labs: 05/22/16 C/W previous labs PLQ Eye Exam: 08/12/16 WNL with Dr. Rae Roam to refill PLQ?

## 2016-10-02 DIAGNOSIS — M810 Age-related osteoporosis without current pathological fracture: Secondary | ICD-10-CM | POA: Insufficient documentation

## 2016-10-02 DIAGNOSIS — M329 Systemic lupus erythematosus, unspecified: Secondary | ICD-10-CM | POA: Insufficient documentation

## 2016-10-02 DIAGNOSIS — M1A09X0 Idiopathic chronic gout, multiple sites, without tophus (tophi): Secondary | ICD-10-CM | POA: Insufficient documentation

## 2016-10-02 DIAGNOSIS — Z8679 Personal history of other diseases of the circulatory system: Secondary | ICD-10-CM | POA: Insufficient documentation

## 2016-10-02 DIAGNOSIS — Z8709 Personal history of other diseases of the respiratory system: Secondary | ICD-10-CM | POA: Insufficient documentation

## 2016-10-02 DIAGNOSIS — M0579 Rheumatoid arthritis with rheumatoid factor of multiple sites without organ or systems involvement: Secondary | ICD-10-CM | POA: Insufficient documentation

## 2016-10-02 DIAGNOSIS — E559 Vitamin D deficiency, unspecified: Secondary | ICD-10-CM | POA: Insufficient documentation

## 2016-10-02 DIAGNOSIS — Z8639 Personal history of other endocrine, nutritional and metabolic disease: Secondary | ICD-10-CM | POA: Insufficient documentation

## 2016-10-02 DIAGNOSIS — M069 Rheumatoid arthritis, unspecified: Secondary | ICD-10-CM | POA: Insufficient documentation

## 2016-10-02 DIAGNOSIS — Z841 Family history of disorders of kidney and ureter: Secondary | ICD-10-CM | POA: Insufficient documentation

## 2016-10-02 DIAGNOSIS — Z87448 Personal history of other diseases of urinary system: Secondary | ICD-10-CM | POA: Insufficient documentation

## 2016-10-02 DIAGNOSIS — M170 Bilateral primary osteoarthritis of knee: Secondary | ICD-10-CM | POA: Insufficient documentation

## 2016-10-02 DIAGNOSIS — Z79899 Other long term (current) drug therapy: Secondary | ICD-10-CM | POA: Insufficient documentation

## 2016-10-02 DIAGNOSIS — M1A9XX0 Chronic gout, unspecified, without tophus (tophi): Secondary | ICD-10-CM | POA: Insufficient documentation

## 2016-10-02 NOTE — Progress Notes (Signed)
Office Visit Note  Patient: Hannah [name]             Date of Birth: Jun 22, 1946           MRN: [id number]             PCP: [name], MD Referring: Glendale Chard, MD Visit Date: 10/05/2016 Occupation: @GUAROCC @    Subjective:  Pain hands   History of Present Illness: Hannah [name] is a 71 y.o. female with history of rheumatoid arthritis and lupus overlap. She has occasional discomfort in her left third PIP joint of her hand. She has off and on discomfort in her lower back. She denies any gout flare. She does have some discomfort in her knees off and on. She states she had cortisone injection last year which was helpful. Patient reports that she had upper respiratory tract infection in November which was treated with antibiotics. It finally cleared up.She had she had another episode about 2-1/2 weeks ago it is gradually resolving. She recalls having a chest x-ray which was negative for pneumonia.  Activities of Daily Living:  Patient reports morning stiffness for 5 minutes.   Patient Denies nocturnal pain.  Difficulty dressing/grooming: Denies Difficulty climbing stairs: Reports Difficulty getting out of chair: Reports Difficulty using hands for taps, buttons, cutlery, and/or writing: Denies   Review of Systems  Constitutional: Negative for fatigue, night sweats, weight gain, weight loss and weakness.  HENT: Negative for mouth sores, trouble swallowing, trouble swallowing, mouth dryness and nose dryness.   Eyes: Positive for dryness. Negative for pain, redness and visual disturbance.  Respiratory: Negative for cough, shortness of breath and difficulty breathing.   Cardiovascular: Positive for hypertension. Negative for chest pain, palpitations, irregular heartbeat and swelling in legs/feet.  Gastrointestinal: Negative for blood in stool, constipation and diarrhea.  Endocrine: Negative for increased urination.  Genitourinary: Negative for vaginal dryness.    Musculoskeletal: Positive for arthralgias, joint pain, joint swelling and morning stiffness. Negative for myalgias, muscle weakness, muscle tenderness and myalgias.  Skin: Negative for color change, rash, hair loss, skin tightness, ulcers and sensitivity to sunlight.  Allergic/Immunologic: Negative for susceptible to infections.  Neurological: Negative for dizziness, memory loss and night sweats.  Hematological: Negative for swollen glands.  Psychiatric/Behavioral: Positive for sleep disturbance. Negative for depressed mood. The patient is not nervous/anxious.     PMFS History:  Patient Active Problem List   Diagnosis Date Noted  . Osteoporosis 10/02/2016  . Systemic lupus erythematosus ([place]) 10/02/2016  . Rheumatoid arthritis involving multiple sites with positive rheumatoid factor ([place]) 10/02/2016  . High risk medication use 10/02/2016  . History of chronic kidney disease 10/02/2016  . 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/2016  . Primary osteoarthritis of both knees 10/02/2016  . Vitamin D deficiency 10/02/2016  . History of diabetes mellitus 10/02/2016  . History of hypertension 10/02/2016  . History of asthma 10/02/2016  . Absolute anemia   . MGUS (monoclonal gammopathy of unknown significance)   . Normocytic anemia 03/21/2015  . Essential hypertension 03/20/2015  . Abnormal CT of the chest 07/30/2012  . Asthma 06/19/2012    Past Medical History:  Diagnosis Date  . Arthritis   . Asthma   . Diabetes mellitus without complication ([place])   . Gout 12/17/2014   patient reported  . Hyperlipidemia   . Hypertension     Family History  Problem Relation Age of Onset  . Heart disease Father   . Diabetes Father   .  Hypertension Mother    No past surgical history on file. Social History   Social History Narrative  . No narrative on file     Objective: Vital Signs: BP 118/72   Pulse 76   Resp 14   Ht 4' 11"  (1.499 m)   Wt 160 lb (72.6 kg)    BMI 32.32 kg/m    Physical Exam  Constitutional: She is oriented to person, place, and time. She appears well-developed and well-nourished.  HENT:  Head: Normocephalic and atraumatic.  Eyes: Conjunctivae and EOM are normal.  Neck: Normal range of motion.  Cardiovascular: Normal rate, regular rhythm, normal heart sounds and intact distal pulses.   Pulmonary/Chest: Effort normal and breath sounds normal.  Abdominal: Soft. Bowel sounds are normal.  Lymphadenopathy:    She has no cervical adenopathy.  Neurological: She is alert and oriented to person, place, and time.  Skin: Skin is warm and dry. Capillary refill takes less than 2 seconds.  Psychiatric: She has a normal mood and affect. Her behavior is normal.  Nursing note and vitals reviewed.    Musculoskeletal Exam: C-spine, thoracic, lumbar spine good range of motion. She is good range of motion of her shoulders elbow joints wrist joints. She has thickening of PIP/DIP and CMC joints bilaterally with no synovitis. Hip joints with good range of motion she has limited extension of her bilateral knee joints. Ankle joints MTPs PIPs with good range of motion with some thickening of PIP joints.  CDAI Exam: CDAI Homunculus Exam:   Tenderness:  Left hand: 3rd PIP  Joint Counts:  CDAI Tender Joint count: 1 CDAI Swollen Joint count: 0  Global Assessments:  Patient Global Assessment: 6 Provider Global Assessment: 1  CDAI Calculated Score: 8    Investigation: Findings:   DEXA shows Tscore -3.2 previous -2.9 in Oct 2015 05/28/2015 I obtained x-ray of bilateral hands, 2-views, today, which showed bilateral second MCP, bilateral third MCP narrowing, all PIP/DIP narrowing, and CMC narrowing.  These findings can go along with chondrocalcinosis and also with history of autoimmune disease like lupus and rheumatoid arthritis.  Bilateral knee joint x-ray showed severe medial compartment narrowing on the right and severe lateral compartment on  the left without chondrocalcinosis.  She has bilateral lateral osteophytes and bilateral severe patellofemoral narrowing.  These findings are consistent with severe osteoarthritis of her knee joints.    June 2016:  Her sed rate was 37, ANA was positive, but no titer was not given.  Smith antibody was positive and CCP antibody was positive at 51.  Her B12 hemoglobin and B12 cholesterol was normal and hemoglobin A1c was 6.3, comprehensive metabolic panel showed creatinine of 1.09 and ALT of 34.  Uric acid was 4.3.  Double strand DNA, RNP, Ro, and La antibodies were negative.    06/05/2015 After informed consent was obtained, ultrasound examination of the bilateral hands was performed using a 12 MHz transducer, grayscale and power Doppler.  The bilateral 2nd, 3rd and 5th MCP joints, the right 1st MCP joint and the left 3rd PIP joint, both dorsal and volar aspects, were evaluated and the bilateral wrist joints, both dorsal and volar aspects, were evaluated.  The findings were:  She mild synovitis in the bilateral 2nd MCP joints, the left 3rd MCP joint and the right 5th MCP joint.  She has no synovitis in her wrist joints.  She also has some joint space narrowing in the left 2nd and 3rd MCP joints.  The right median nerve was 0.13  cm(2) which was more than the upper limits of normal and the left median nerve was 0.11 cm(2), which was almost the upper limits of normal.  She has no clinical features of carpal tunnel syndrome.     August 2017:  CBC showed hemoglobin of 10.3, comprehensive metabolic panel showed creatinine of 1.25 and GFR of 50, which was stable.  ALT was elevated at 39, her UA was negative, sed rate was 10, double-stranded DNA was negative, complements were normal, ANA was high at more than 1:1280.  Her vitamin D was 40.     No visits with results within 2 Month(s) from this visit.  Latest known visit with results is:  Appointment on 08/03/2016  Component Date Value Ref Range Status  . WBC  08/03/2016 4.5  3.9 - 10.3 10e3/uL Final  . NEUT# 08/03/2016 2.7  1.5 - 6.5 10e3/uL Final  . HGB 08/03/2016 9.7* 11.6 - 15.9 g/dL Final  . HCT 08/03/2016 30.5* 34.8 - 46.6 % Final  . Platelets 08/03/2016 137* 145 - 400 10e3/uL Final  . MCV 08/03/2016 98.7  79.5 - 101.0 fL Final  . MCH 08/03/2016 31.4  25.1 - 34.0 pg Final  . MCHC 08/03/2016 31.8  31.5 - 36.0 g/dL Final  . RBC 08/03/2016 3.09* 3.70 - 5.45 10e6/uL Final  . RDW 08/03/2016 13.7  11.2 - 14.5 % Final  . lymph# 08/03/2016 0.9  0.9 - 3.3 10e3/uL Final  . MONO# 08/03/2016 0.3  0.1 - 0.9 10e3/uL Final  . Eosinophils Absolute 08/03/2016 0.6* 0.0 - 0.5 10e3/uL Final  . Basophils Absolute 08/03/2016 0.0  0.0 - 0.1 10e3/uL Final  . NEUT% 08/03/2016 61.3  38.4 - 76.8 % Final  . LYMPH% 08/03/2016 19.2  14.0 - 49.7 % Final  . MONO% 08/03/2016 6.5  0.0 - 14.0 % Final  . EOS% 08/03/2016 12.3* 0.0 - 7.0 % Final  . BASO% 08/03/2016 0.7  0.0 - 2.0 % Final  . Retic % 08/03/2016 1.76  0.70 - 2.10 % Final  . Retic Ct Abs 08/03/2016 54.38  33.70 - 90.70 10e3/uL Final  . Immature Retic Fract 08/03/2016 9.60  1.60 - 10.00 % Final  . Sodium 08/03/2016 142  136 - 145 mEq/L Final  . Potassium 08/03/2016 3.9  3.5 - 5.1 mEq/L Final  . Chloride 08/03/2016 109  98 - 109 mEq/L Final  . CO2 08/03/2016 24  22 - 29 mEq/L Final  . Glucose 08/03/2016 89  70 - 140 mg/dl Final  . BUN 08/03/2016 23.1  7.0 - 26.0 mg/dL Final  . Creatinine 08/03/2016 1.2* 0.6 - 1.1 mg/dL Final  . Total Bilirubin 08/03/2016 0.51  0.20 - 1.20 mg/dL Final  . Alkaline Phosphatase 08/03/2016 79  40 - 150 U/L Final  . AST 08/03/2016 29  5 - 34 U/L Final  . ALT 08/03/2016 34  0 - 55 U/L Final  . Total Protein 08/03/2016 7.1  6.4 - 8.3 g/dL Final  . Albumin 08/03/2016 3.6  3.5 - 5.0 g/dL Final  . Calcium 08/03/2016 9.2  8.4 - 10.4 mg/dL Final  . Anion Gap 08/03/2016 9  3 - 11 mEq/L Final  . EGFR 08/03/2016 53* >90 ml/min/1.73 m2 Final  . IgG, Qn, Serum 08/06/2016 1,126  700 - 1600  mg/dL Final  . IgA, Qn, Serum 08/06/2016 178  87 - 352 mg/dL Final  . IgM, Qn, Serum 08/06/2016 33  26 - 217 mg/dL Final  . Total Protein 08/06/2016 6.7  6.0 - 8.5 g/dL  Final  . Albumin SerPl Elph-Mcnc 08/06/2016 3.8  2.9 - 4.4 g/dL Final  . Alpha 1 08/06/2016 0.2  0.0 - 0.4 g/dL Final  . Alpha2 Glob SerPl Elph-Mcnc 08/06/2016 0.7  0.4 - 1.0 g/dL Final  . B-Globulin SerPl Elph-Mcnc 08/06/2016 0.9  0.7 - 1.3 g/dL Final  . Gamma Glob SerPl Elph-Mcnc 08/06/2016 1.1  0.4 - 1.8 g/dL Final  . M Protein SerPl Elph-Mcnc 08/06/2016 0.3* Not Observed g/dL Final  . Globulin, Total 08/06/2016 2.9  2.2 - 3.9 g/dL Final  . Albumin/Glob SerPl 08/06/2016 1.4  0.7 - 1.7 Final  . IFE 1 08/06/2016 Comment   Final   Comment: Immunofixation shows IgG monoclonal protein with kappa light chain specificity.   . Please Note 08/06/2016 Comment   Final   Comment: Protein electrophoresis scan will follow via computer, mail, or courier delivery.   . 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 Otherwise the lungs are clear and well aerated. Mediastinal and hilar contours are unchanged. The heart is borderline enlarged and stable. There are degenerative changes throughout the thoracic spine. Anterolisthesis of L4 on L5 is noted on the lateral view. IMPRESSION: 1. No change in rounded mass posteriorly at the right lung base most consistent with Bochdalek hernia. This rounded mass is stable at least since chest x-ray of 2014. 2. Mild cardiomegaly. 3. Anterolisthesis of L4 on L5 with degenerative change.  Electronically Signed   By: Ivar Drape M.D.   On: 09/24/2016 09:08   Xr Foot 2 Views Left  Result Date: 10/05/2016 First MTP narrowing PIP/DIP narrowing. No erosive changes. A small calcaneal spur was noted. Impression: These findings are consistent with mild osteoarthritis.  Xr Foot 2 Views Right  Result Date: 10/05/2016 First MTP narrowing PIP/DIP narrowing. No erosive changes. A small calcaneal spur was noted. Impression: These findings are consistent with mild osteoarthritis.  Xr Hand 2 View Left  Result Date: 10/05/2016 Left second and third MCP narrowing. All PIP/DIP and CMC narrowing. No erosive changes. Impression: These findings are consistent with osteoarthritis and rheumatoid arthritis overlap.  Xr Hand 2 View Right  Result Date: 10/05/2016 Right second and third MCP joint narrowing. All PIP/DIP and CMC joint narrowing. Juxta articular osteopenia. No erosions were noted. Impression: These findings are consistent with rheumatoid arthritis and osteoarthritis overlap.   Speciality Comments: No specialty comments available.    Procedures:  No procedures performed Allergies: Shellfish allergy   Assessment / Plan:     Visit Diagnoses: Rheumatoid arthritis involving multiple sites with positive rheumatoid factor ([facility]) - +CCP +RF: She gives history of intermittent swelling in her left third PIP joint. She has no synovitis on examination she has some thickening of her PIP/DIP joints consistent with osteoarthritis. I'll obtain x-rays to follow-up on that.  - Plan: XR Hand 2 View Right, XR Hand 2 View Left, XR Foot 2 Views Right, XR Foot 2 Views Left  Other systemic lupus erythematosus with other organ involvement ([place]) - +ANA Smith and RNP : She has not had any flare of lupus. Her last  labs showed high ANA titer but complements and sedimentation rate were normal.  High risk medication use - 06/2016 eye exam normal for PLQ use Dr Katy Fitch  - Plan: CBC with Differential/Platelet, COMPLETE  METABOLIC PANEL WITH GFR  Idiopathic chronic gout of multiple sites without tophus: She has not had any gout flare on allopurinol.  Primary osteoarthritis of both knees - Bilateral severe: She does have some limited extension and chronic discomfort but doing fairly well.  Osteoporosis - T score -3.3, BMD 0.488 07/27/2016 which is decreased by -10% from 07/09/2014. She has not taken any treatment in a while.. Detailed discussion regarding Fosamax today and plan to start her on Fosamax 70 mg by mouth every week. Indications side effects contraindications were discussed at length. Need for vitamin D calcium and resistive exercises were discussed.  Vitamin D deficiency: Her vitamin D was in desirable range.  She has other multiple medical problems which are listed as below:  History of chronic kidney disease  History of diabetes mellitus  MGUS (monoclonal gammopathy of unknown significance)  History of hypertension  History of asthma    Orders: Orders Placed This Encounter  Procedures  . XR Hand 2 View Right  . XR Hand 2 View Left  . XR Foot 2 Views Right  . XR Foot 2 Views Left  . CBC with Differential/Platelet  . COMPLETE METABOLIC PANEL WITH GFR   Meds ordered this encounter  Medications  . alendronate (FOSAMAX) 70 MG tablet    Sig: Take 1 tablet (70 mg total) by mouth once a week. Take with a full glass of water on an empty stomach.    Dispense:  12 tablet    Refill:  1    Face-to-face time spent with patient was in 35 minutes. 50% of time was spent in counseling and coordination of care.  Follow-Up Instructions: Return in about 5 months (around 03/05/2017) for Rheumatoid arthritis, Systemic lupus.   Bo Merino, MD  Note - This record has been created using Editor, commissioning.  Chart creation errors have been sought, but may not always  have been located. Such creation errors do not reflect on  the standard of medical care.

## 2016-10-05 ENCOUNTER — Ambulatory Visit (INDEPENDENT_AMBULATORY_CARE_PROVIDER_SITE_OTHER): Payer: Commercial Managed Care - HMO

## 2016-10-05 ENCOUNTER — Ambulatory Visit (INDEPENDENT_AMBULATORY_CARE_PROVIDER_SITE_OTHER): Payer: Commercial Managed Care - HMO | Admitting: Rheumatology

## 2016-10-05 ENCOUNTER — Encounter: Payer: Self-pay | Admitting: Rheumatology

## 2016-10-05 VITALS — BP 118/72 | HR 76 | Resp 14 | Ht 59.0 in | Wt 160.0 lb

## 2016-10-05 DIAGNOSIS — M810 Age-related osteoporosis without current pathological fracture: Secondary | ICD-10-CM

## 2016-10-05 DIAGNOSIS — Z8679 Personal history of other diseases of the circulatory system: Secondary | ICD-10-CM

## 2016-10-05 DIAGNOSIS — M0579 Rheumatoid arthritis with rheumatoid factor of multiple sites without organ or systems involvement: Secondary | ICD-10-CM

## 2016-10-05 DIAGNOSIS — D472 Monoclonal gammopathy: Secondary | ICD-10-CM | POA: Diagnosis not present

## 2016-10-05 DIAGNOSIS — Z8639 Personal history of other endocrine, nutritional and metabolic disease: Secondary | ICD-10-CM | POA: Diagnosis not present

## 2016-10-05 DIAGNOSIS — M1A09X0 Idiopathic chronic gout, multiple sites, without tophus (tophi): Secondary | ICD-10-CM

## 2016-10-05 DIAGNOSIS — E559 Vitamin D deficiency, unspecified: Secondary | ICD-10-CM

## 2016-10-05 DIAGNOSIS — M170 Bilateral primary osteoarthritis of knee: Secondary | ICD-10-CM | POA: Diagnosis not present

## 2016-10-05 DIAGNOSIS — Z8709 Personal history of other diseases of the respiratory system: Secondary | ICD-10-CM

## 2016-10-05 DIAGNOSIS — Z79899 Other long term (current) drug therapy: Secondary | ICD-10-CM

## 2016-10-05 DIAGNOSIS — M3219 Other organ or system involvement in systemic lupus erythematosus: Secondary | ICD-10-CM | POA: Diagnosis not present

## 2016-10-05 DIAGNOSIS — Z87448 Personal history of other diseases of urinary system: Secondary | ICD-10-CM | POA: Diagnosis not present

## 2016-10-05 LAB — CBC WITH DIFFERENTIAL/PLATELET
Basophils Absolute: 49 cells/uL (ref 0–200)
Basophils Relative: 1 %
Eosinophils Absolute: 147 cells/uL (ref 15–500)
Eosinophils Relative: 3 %
HCT: 32.1 % — ABNORMAL LOW (ref 35.0–45.0)
Hemoglobin: 10 g/dL — ABNORMAL LOW (ref 11.7–15.5)
Lymphocytes Relative: 20 %
Lymphs Abs: 980 cells/uL (ref 850–3900)
MCH: 31.1 pg (ref 27.0–33.0)
MCHC: 31.2 g/dL — ABNORMAL LOW (ref 32.0–36.0)
MCV: 99.7 fL (ref 80.0–100.0)
MPV: 10.6 fL (ref 7.5–12.5)
Monocytes Absolute: 490 cells/uL (ref 200–950)
Monocytes Relative: 10 %
Neutro Abs: 3234 cells/uL (ref 1500–7800)
Neutrophils Relative %: 66 %
Platelets: 226 10*3/uL (ref 140–400)
RBC: 3.22 MIL/uL — ABNORMAL LOW (ref 3.80–5.10)
RDW: 15.3 % — ABNORMAL HIGH (ref 11.0–15.0)
WBC: 4.9 10*3/uL (ref 3.8–10.8)

## 2016-10-05 LAB — COMPLETE METABOLIC PANEL WITH GFR
ALT: 22 U/L (ref 6–29)
AST: 30 U/L (ref 10–35)
Albumin: 3.5 g/dL — ABNORMAL LOW (ref 3.6–5.1)
Alkaline Phosphatase: 57 U/L (ref 33–130)
BUN: 19 mg/dL (ref 7–25)
CO2: 25 mmol/L (ref 20–31)
Calcium: 9 mg/dL (ref 8.6–10.4)
Chloride: 107 mmol/L (ref 98–110)
Creat: 1.12 mg/dL — ABNORMAL HIGH (ref 0.60–0.93)
GFR, Est African American: 57 mL/min — ABNORMAL LOW (ref >=60)
GFR, Est Non African American: 50 mL/min — ABNORMAL LOW (ref >=60)
Glucose, Bld: 81 mg/dL (ref 65–99)
Potassium: 4 mmol/L (ref 3.5–5.3)
Sodium: 140 mmol/L (ref 135–146)
Total Bilirubin: 0.5 mg/dL (ref 0.2–1.2)
Total Protein: 6.4 g/dL (ref 6.1–8.1)

## 2016-10-05 MED ORDER — ALENDRONATE SODIUM 70 MG PO TABS: 70.0000 mg | ORAL_TABLET | ORAL | 1 refills | Status: DC

## 2016-10-05 NOTE — Progress Notes (Signed)
Pharmacy Note  Subjective: Patient presents today to the [facility] to see Dr. Estanislado Pandy.  Patient seen by pharmacist for counseling on bisphosphonate therapy.    Objective: T-score: -3.2 (07/27/16) Calcium: 8.0 mg/dL (05/21/16)  Vitamin D: 40 ng/mL (05/21/16)  Assessment/Plan: 1. Medication review:  Counseled patient that Fosamax is an oral bisphosphonate that reduces bone turnover by inhibiting osteoclasts that chew up bone.  Counseled patient on purpose, proper use, and adverse effects of Fosamax.  Reviewed with patient the Fosamax should be taken once weekly.  Fosamax must be taking first thing in the morning, with a full glass of water, and she must wait an hour prior to eating food.  Also advised patient that she should not lie down until after she has eaten.  Reviewed importance of taking calcium and vitamin D with bisphosphonate therapy.  Patient confirms she is already taking vitamin D.  Reviewed high-calcium containing foods and advised patient to ensure she is getting at leas 1000 mg calcium daily.  Provided patient with medication education material and answered all questions.  Reviewed adverse events of Fosamax including risk of nausea & diarrhea, headache, and muscle & bone pain.  Reviewed rare adverse effect of osteonecrosis of the jaw and advised patient to alert her dentist that she is on Fosamax prior to any major dental work.  Patient confirms she does not have any major dental work scheduled at this time.  Patient agrees to trial of Fosamax at this time.     Elisabeth Most, Pharm.D., BCPS Clinical Pharmacist Pager: [phone] Phone: [phone] 10/05/2016 10:03 AM

## 2016-10-05 NOTE — Patient Instructions (Signed)
Alendronate tablets What is this medicine? ALENDRONATE (a LEN droe nate) slows calcium loss from bones. It helps to make normal healthy bone and to slow bone loss in people with Paget's disease and osteoporosis. It may be used in others at risk for bone loss. This medicine may be used for other purposes; ask your health care provider or pharmacist if you have questions. COMMON BRAND NAME(S): Fosamax What should I tell my health care provider before I take this medicine? They need to know if you have any of these conditions: -dental disease -esophagus, stomach, or intestine problems, like acid reflux or GERD -kidney disease -low blood calcium -low vitamin D -problems sitting or standing 30 minutes -trouble swallowing -an unusual or allergic reaction to alendronate, other medicines, foods, dyes, or preservatives -pregnant or trying to get pregnant -breast-feeding How should I use this medicine? You must take this medicine exactly as directed or you will lower the amount of the medicine you absorb into your body or you may cause yourself harm. Take this medicine by mouth first thing in the morning, after you are up for the day. Do not eat or drink anything before you take your medicine. Swallow the tablet with a full glass (6 to 8 fluid ounces) of plain water. Do not take this medicine with any other drink. Do not chew or crush the tablet. After taking this medicine, do not eat breakfast, drink, or take any medicines or vitamins for at least 30 minutes. Sit or stand up for at least 30 minutes after you take this medicine; do not lie down. Do not take your medicine more often than directed. Talk to your pediatrician regarding the use of this medicine in children. Special care may be needed. Overdosage: If you think you have taken too much of this medicine contact a poison control center or emergency room at once. NOTE: This medicine is only for you. Do not share this medicine with others. What if I  miss a dose? If you miss a dose, do not take it later in the day. Continue your normal schedule starting the next morning. Do not take double or extra doses. What may interact with this medicine? -aluminum hydroxide -antacids -aspirin -calcium supplements -drugs for inflammation like ibuprofen, naproxen, and others -iron supplements -magnesium supplements -vitamins with minerals This list may not describe all possible interactions. Give your health care provider a list of all the medicines, herbs, non-prescription drugs, or dietary supplements you use. Also tell them if you smoke, drink alcohol, or use illegal drugs. Some items may interact with your medicine. What should I watch for while using this medicine? Visit your doctor or health care professional for regular checks ups. It may be some time before you see benefit from this medicine. Do not stop taking your medicine except on your doctor's advice. Your doctor or health care professional may order blood tests and other tests to see how you are doing. You should make sure you get enough calcium and vitamin D while you are taking this medicine, unless your doctor tells you not to. Discuss the foods you eat and the vitamins you take with your health care professional. Some people who take this medicine have severe bone, joint, and/or muscle pain. This medicine may also increase your risk for a broken thigh bone. Tell your doctor right away if you have pain in your upper leg or groin. Tell your doctor if you have any pain that does not go away or that gets worse.  This medicine can make you more sensitive to the sun. If you get a rash while taking this medicine, sunlight may cause the rash to get worse. Keep out of the sun. If you cannot avoid being in the sun, wear protective clothing and use sunscreen. Do not use sun lamps or tanning beds/booths. What side effects may I notice from receiving this medicine? Side effects that you should report to  your doctor or health care professional as soon as possible: -allergic reactions like skin rash, itching or hives, swelling of the face, lips, or tongue -black or tarry stools -bone, muscle or joint pain -changes in vision -chest pain -heartburn or stomach pain -jaw pain, especially after dental work -pain or trouble when swallowing -redness, blistering, peeling or loosening of the skin, including inside the mouth Side effects that usually do not require medical attention (report to your doctor or health care professional if they continue or are bothersome): -changes in taste -diarrhea or constipation -eye pain or itching -headache -nausea or vomiting -stomach gas or fullness This list may not describe all possible side effects. Call your doctor for medical advice about side effects. You may report side effects to FDA at 1-800-FDA-1088. Where should I keep my medicine? Keep out of the reach of children. Store at room temperature of 15 and 30 degrees C (59 and 86 degrees F). Throw away any unused medicine after the expiration date. NOTE: This sheet is a summary. It may not cover all possible information. If you have questions about this medicine, talk to your doctor, pharmacist, or health care provider.  2017 Elsevier/Gold Standard (2011-03-13 08:56:09)    Osteoporosis Osteoporosis is the thinning and loss of density in the bones. Osteoporosis makes the bones more brittle, fragile, and likely to break (fracture). Over time, osteoporosis can cause the bones to become so weak that they fracture after a simple fall. The bones most likely to fracture are the bones in the hip, wrist, and spine. What are the causes? The exact cause is not known. What increases the risk? Anyone can develop osteoporosis. You may be at greater risk if you have a family history of the condition or have poor nutrition. You may also have a higher risk if you are:  Female.  72 years old or older.  A  smoker.  Not physically active.  White or Asian.  Slender. What are the signs or symptoms? A fracture might be the first sign of the disease, especially if it results from a fall or injury that would not usually cause a bone to break. Other signs and symptoms include:  Low back and neck pain.  Stooped posture.  Height loss. How is this diagnosed? To make a diagnosis, your health care provider may:  Take a medical history.  Perform a physical exam.  Order tests, such as:  A bone mineral density test.  A dual-energy X-ray absorptiometry test. How is this treated? The goal of osteoporosis treatment is to strengthen your bones to reduce your risk of a fracture. Treatment may involve:  Making lifestyle changes, such as:  Eating a diet rich in calcium.  Doing weight-bearing and muscle-strengthening exercises.  Stopping tobacco use.  Limiting alcohol intake.  Taking medicine to slow the process of bone loss or to increase bone density.  Monitoring your levels of calcium and vitamin D. Follow these instructions at home:  Include calcium and vitamin D in your diet. Calcium is important for bone health, and vitamin D helps the body absorb  calcium.  Perform weight-bearing and muscle-strengthening exercises as directed by your health care provider.  Do not use any tobacco products, including cigarettes, chewing tobacco, and electronic cigarettes. If you need help quitting, ask your health care provider.  Limit your alcohol intake.  Take medicines only as directed by your health care provider.  Keep all follow-up visits as directed by your health care provider. This is important.  Take precautions at home to lower your risk of falling, such as:  Keeping rooms well lit and clutter free.  Installing safety rails on stairs.  Using rubber mats in the bathroom and other areas that are often wet or slippery. Get help right away if: You fall or injure yourself. This  information is not intended to replace advice given to you by your health care provider. Make sure you discuss any questions you have with your health care provider. Document Released: 06/24/2005 Document Revised: 02/17/2016 Document Reviewed: 02/22/2014 Elsevier Interactive Patient Education  2017 [employer].

## 2016-10-06 ENCOUNTER — Telehealth: Payer: Self-pay | Admitting: *Deleted

## 2016-10-06 NOTE — Telephone Encounter (Signed)
Patient states she was reading over the information given to her in the office regarding Fosamax. Patient is concerned because she does occasionally suffer with acid reflux. Patient states she has also been diagnosed kidney disease stage 3. Patient states she had an MRI which should benign cysts on her kidneys. She is to follow up with her specialist in 1 year. She would like to know if she should take the Fosamax.

## 2016-10-06 NOTE — Progress Notes (Signed)
Labs are stable.

## 2016-10-06 NOTE — Telephone Encounter (Signed)
Ok to take Fosamax

## 2016-10-06 NOTE — Telephone Encounter (Signed)
I called patient to discuss.  Renal function yesterday showed GFR of 57.  Reviewed with patient that we avoid use when GFR is less than 35.  Also discussed risk of GI mucosa irritation.  Patient reports she has not been diagnosed with GERD, but she occasionally takes antacids as needed.  Reviewed instructions for how to take the medication to reduce irritation.  Advised patient to discontinue use and let us know if she develops new or worsening symptoms while taking Fosamax.  Patient voiced understanding.

## 2016-10-26 DIAGNOSIS — H1131 Conjunctival hemorrhage, right eye: Secondary | ICD-10-CM | POA: Diagnosis not present

## 2016-11-16 DIAGNOSIS — I129 Hypertensive chronic kidney disease with stage 1 through stage 4 chronic kidney disease, or unspecified chronic kidney disease: Secondary | ICD-10-CM | POA: Diagnosis not present

## 2016-11-16 DIAGNOSIS — N183 Chronic kidney disease, stage 3 (moderate): Secondary | ICD-10-CM | POA: Diagnosis not present

## 2016-11-16 DIAGNOSIS — E1122 Type 2 diabetes mellitus with diabetic chronic kidney disease: Secondary | ICD-10-CM | POA: Diagnosis not present

## 2016-11-16 DIAGNOSIS — N08 Glomerular disorders in diseases classified elsewhere: Secondary | ICD-10-CM | POA: Diagnosis not present

## 2016-11-30 DIAGNOSIS — Z79899 Other long term (current) drug therapy: Secondary | ICD-10-CM | POA: Diagnosis not present

## 2016-12-15 ENCOUNTER — Ambulatory Visit: Payer: Commercial Managed Care - HMO | Admitting: Rheumatology

## 2016-12-16 DIAGNOSIS — E119 Type 2 diabetes mellitus without complications: Secondary | ICD-10-CM | POA: Diagnosis not present

## 2016-12-16 DIAGNOSIS — E039 Hypothyroidism, unspecified: Secondary | ICD-10-CM | POA: Diagnosis not present

## 2016-12-17 DIAGNOSIS — Z1231 Encounter for screening mammogram for malignant neoplasm of breast: Secondary | ICD-10-CM | POA: Diagnosis not present

## 2017-01-18 ENCOUNTER — Other Ambulatory Visit: Payer: Self-pay | Admitting: Rheumatology

## 2017-01-18 DIAGNOSIS — M069 Rheumatoid arthritis, unspecified: Secondary | ICD-10-CM

## 2017-01-19 NOTE — Telephone Encounter (Signed)
10/05/16 last visit  03/05/17 next visit  Labs 10/05/16 PLQ eye exam 08/12/16 normal  Ok to refill per Dr Estanislado Pandy

## 2017-02-01 ENCOUNTER — Other Ambulatory Visit: Payer: Commercial Managed Care - HMO

## 2017-02-01 ENCOUNTER — Ambulatory Visit: Payer: Commercial Managed Care - HMO | Admitting: Hematology

## 2017-02-12 DIAGNOSIS — N2581 Secondary hyperparathyroidism of renal origin: Secondary | ICD-10-CM | POA: Diagnosis not present

## 2017-02-12 DIAGNOSIS — N183 Chronic kidney disease, stage 3 (moderate): Secondary | ICD-10-CM | POA: Diagnosis not present

## 2017-02-12 DIAGNOSIS — Z6834 Body mass index (BMI) 34.0-34.9, adult: Secondary | ICD-10-CM | POA: Diagnosis not present

## 2017-02-16 ENCOUNTER — Ambulatory Visit (HOSPITAL_BASED_OUTPATIENT_CLINIC_OR_DEPARTMENT_OTHER): Payer: Medicare HMO | Admitting: Hematology

## 2017-02-16 ENCOUNTER — Encounter: Payer: Self-pay | Admitting: Hematology

## 2017-02-16 ENCOUNTER — Telehealth: Payer: Self-pay | Admitting: Hematology

## 2017-02-16 ENCOUNTER — Other Ambulatory Visit (HOSPITAL_BASED_OUTPATIENT_CLINIC_OR_DEPARTMENT_OTHER): Payer: Medicare HMO

## 2017-02-16 VITALS — BP 163/66 | HR 65 | Temp 98.1°F | Resp 18 | Ht 59.0 in | Wt 168.3 lb

## 2017-02-16 DIAGNOSIS — D649 Anemia, unspecified: Secondary | ICD-10-CM

## 2017-02-16 DIAGNOSIS — D472 Monoclonal gammopathy: Secondary | ICD-10-CM | POA: Diagnosis not present

## 2017-02-16 DIAGNOSIS — D508 Other iron deficiency anemias: Secondary | ICD-10-CM

## 2017-02-16 LAB — FERRITIN: Ferritin: 38 ng/ml (ref 9–269)

## 2017-02-16 LAB — CBC & DIFF AND RETIC
BASO%: 0.7 % (ref 0.0–2.0)
Basophils Absolute: 0 10*3/uL (ref 0.0–0.1)
EOS%: 8.9 % — ABNORMAL HIGH (ref 0.0–7.0)
Eosinophils Absolute: 0.4 10*3/uL (ref 0.0–0.5)
HCT: 31.4 % — ABNORMAL LOW (ref 34.8–46.6)
HGB: 10.2 g/dL — ABNORMAL LOW (ref 11.6–15.9)
Immature Retic Fract: 5.4 % (ref 1.60–10.00)
LYMPH%: 23.6 % (ref 14.0–49.7)
MCH: 31.1 pg (ref 25.1–34.0)
MCHC: 32.5 g/dL (ref 31.5–36.0)
MCV: 95.7 fL (ref 79.5–101.0)
MONO#: 0.4 10*3/uL (ref 0.1–0.9)
MONO%: 8.7 % (ref 0.0–14.0)
NEUT#: 2.3 10*3/uL (ref 1.5–6.5)
NEUT%: 58.1 % (ref 38.4–76.8)
Platelets: 125 10*3/uL — ABNORMAL LOW (ref 145–400)
RBC: 3.28 10*6/uL — ABNORMAL LOW (ref 3.70–5.45)
RDW: 14.3 % (ref 11.2–14.5)
Retic %: 1.46 % (ref 0.70–2.10)
Retic Ct Abs: 47.89 10*3/uL (ref 33.70–90.70)
WBC: 4 10*3/uL (ref 3.9–10.3)
lymph#: 1 10*3/uL (ref 0.9–3.3)

## 2017-02-16 LAB — COMPREHENSIVE METABOLIC PANEL
ALT: 27 U/L (ref 0–55)
AST: 25 U/L (ref 5–34)
Albumin: 3.9 g/dL (ref 3.5–5.0)
Alkaline Phosphatase: 54 U/L (ref 40–150)
Anion Gap: 9 mEq/L (ref 3–11)
BUN: 22.2 mg/dL (ref 7.0–26.0)
CO2: 27 mEq/L (ref 22–29)
Calcium: 9.3 mg/dL (ref 8.4–10.4)
Chloride: 104 mEq/L (ref 98–109)
Creatinine: 1.2 mg/dL — ABNORMAL HIGH (ref 0.6–1.1)
EGFR: 50 mL/min/{1.73_m2} — ABNORMAL LOW (ref >90)
Glucose: 87 mg/dl (ref 70–140)
Potassium: 3.6 mEq/L (ref 3.5–5.1)
Sodium: 141 mEq/L (ref 136–145)
Total Bilirubin: 0.6 mg/dL (ref 0.20–1.20)
Total Protein: 6.9 g/dL (ref 6.4–8.3)

## 2017-02-16 LAB — IRON AND TIBC
%SAT: 29 % (ref 21–57)
Iron: 84 ug/dL (ref 41–142)
TIBC: 289 ug/dL (ref 236–444)
UIBC: 204 ug/dL (ref 120–384)

## 2017-02-16 NOTE — Telephone Encounter (Signed)
Gave patient AVS and calender per 5/22 los. Lab and f/u in 6 months.

## 2017-02-16 NOTE — Patient Instructions (Signed)
Thank you for choosing [facility] to provide your oncology and hematology care.  To afford each patient quality time with our providers, please arrive 30 minutes before your scheduled appointment time.  If you arrive late for your appointment, you may be asked to reschedule.  We strive to give you quality time with our providers, and arriving late affects you and other patients whose appointments are after yours.   If you are a no show for multiple scheduled visits, you may be dismissed from the clinic at the providers discretion.    Again, thank you for choosing [facility], our hope is that these requests will decrease the amount of time that you wait before being seen by our physicians.  ______________________________________________________________________  Should you have questions after your visit to the  Cancer Center, please contact our office at [phone] between the hours of 8:30 and 4:30 p.m.    Voicemails left after 4:30p.m will not be returned until the following business day.    For prescription refill requests, please have your pharmacy contact us directly.  Please also try to allow 48 hours for prescription requests.    Please contact the scheduling department for questions regarding scheduling.  For scheduling of procedures such as PET scans, CT scans, MRI, Ultrasound, etc please contact central scheduling at [phone].    Resources For Cancer Patients and Caregivers:   Oncolink.org:  A wonderful resource for patients and healthcare providers for information regarding your disease, ways to tract your treatment, what to expect, etc.     American Cancer Society:  800-227-2345  Can help patients locate various types of support and financial assistance  Cancer Care: 1-800-813-HOPE (4673) Provides financial assistance, online support groups, medication/co-pay assistance.    [place] DSS:  336-641-3447 Where to apply for food  stamps, Medicaid, and utility assistance  Medicare Rights Center: 800-333-4114 Helps people with Medicare understand their rights and benefits, navigate the Medicare system, and secure the quality healthcare they deserve  SCAT: 336-333-6589 [place] [employer]'s shared-ride transportation service for eligible riders who have a disability that prevents them from riding the fixed route bus.    For additional information on assistance programs please contact our social worker:   Grier Hock/Abigail Elmore:  [phone]            

## 2017-02-16 NOTE — Progress Notes (Signed)
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 She notes no acute new symptoms.  She has been told that she has osteoporosis for his she has been started on Fosamax weekly. Energy levels have been stable. She continues to be on Plaquenil for her lupus/RA.  No new bone pains. Her lab shows stable anemia. Stable creatinine. No hypercalcemia. No other acute new concerns.  REVIEW OF SYSTEMS:    10 Point review of systems of done and is negative except as noted above.  . Past Medical History:  Diagnosis Date  . Arthritis   . Asthma   . Diabetes mellitus without complication ([place])   . Gout 12/17/2014   patient reported  . Hyperlipidemia   . Hypertension   possible SLE/rheumatoid arthritis following with Dr.Deveshwar.  .History reviewed. No pertinent surgical history.  . Social History  Substance Use Topics  . Smoking status: Never Smoker  . Smokeless tobacco: Never Used  . Alcohol use Yes     Comment: occasional    ALLERGIES:  is allergic to shellfish allergy.  MEDICATIONS:  Current Outpatient Prescriptions  Medication Sig Dispense Refill  . alendronate (FOSAMAX) 70 MG tablet Take 1 tablet (70 mg total) by mouth once a week. Take with a full glass of water on an empty stomach. 12 tablet 1  . allopurinol (ZYLOPRIM) 100 MG tablet Take 100 mg by mouth daily.    [name] aspirin EC 81 MG tablet Take 81 mg by mouth daily.    [name] atorvastatin (LIPITOR) 80 MG tablet Take 80 mg by mouth daily.    . budesonide-formoterol (SYMBICORT) 160-4.5 MCG/ACT inhaler  Take 2 puffs first thing in am and then another 2 puffs about 12 hours later. 6 g 11  . Calcium Carb-Cholecalciferol (CALCIUM 600+D) 600-800 MG-UNIT TABS Take by mouth.    . Cholecalciferol (VITAMIN D PO) Take 1 capsule by mouth daily.    . Ferrous Sulfate (IRON) 90 (18 FE) MG TABS     . hydroxychloroquine (PLAQUENIL) 200 MG tablet TAKE 1 TABLET BY MOUTH EVERY DAY 90 tablet 0  . LUBRICANT EYE DROPS 0.4-0.3 % SOLN     . metFORMIN (GLUCOPHAGE) 500 MG tablet Take 500 mg by mouth 3 (three) times daily.     [name] SYNTHROID 112 MCG tablet TK 1 T PO  QAM  2  . valsartan-hydrochlorothiazide (DIOVAN-HCT) 160-25 MG tablet Take by mouth daily.  2  . VENTOLIN HFA 108 (90 Base) MCG/ACT inhaler INL 2 PFS PO Q 4 TO 6 H PRN  5   No current facility-administered medications for this visit.     PHYSICAL EXAMINATION: ECOG PERFORMANCE STATUS: 1 - Symptomatic but completely ambulatory  . Vitals:   02/16/17 0851  BP: (!) 163/66  Pulse: 65  Resp: 18  Temp: 98.1 F (36.7 C)    Filed Weights   02/16/17 0851  Weight: 168 lb 4.8 oz (76.3 kg)   .Body mass index is 33.99 kg/m.  GENERAL:alert, in no acute distress and comfortable SKIN: skin color, texture, turgor are normal, no rashes or significant  lesions EYES: normal, conjunctiva are pink and non-injected, sclera clear OROPHARYNX:no exudate, no erythema and lips, buccal mucosa, and tongue normal  NECK: supple, no JVD, thyroid normal size, non-tender, without nodularity LYMPH:  no palpable lymphadenopathy in the cervical, axillary or inguinal LUNGS: clear to auscultation with normal respiratory effort HEART: regular rate & rhythm,  no murmurs and no lower extremity edema ABDOMEN: abdomen soft, non-tender, normoactive bowel sounds  Musculoskeletal: no cyanosis of digits and no clubbing  PSYCH: alert & oriented x 3 with fluent speech NEURO: no focal motor/sensory deficits  LABORATORY DATA:   I have reviewed the data as listed  . CBC Latest Ref Rng  & Units 02/16/2017 10/05/2016 08/03/2016  WBC 3.9 - 10.3 10e3/uL 4.0 4.9 4.5  Hemoglobin 11.6 - 15.9 g/dL 10.2(L) 10.0(L) 9.7(L)  Hematocrit 34.8 - 46.6 % 31.4(L) 32.1(L) 30.5(L)  Platelets 145 - 400 10e3/uL 125(L) 226 137(L)    . CMP Latest Ref Rng & Units 02/16/2017 10/05/2016 08/03/2016  Glucose 70 - 140 mg/dl 87 81 89  BUN 7.0 - 26.0 mg/dL 22.2 19 23.1  Creatinine 0.6 - 1.1 mg/dL 1.2(H) 1.12(H) 1.2(H)  Sodium 136 - 145 mEq/L 141 140 142  Potassium 3.5 - 5.1 mEq/L 3.6 4.0 3.9  Chloride 98 - 110 mmol/L - 107 -  CO2 22 - 29 mEq/L _0 Calcium 8.4 - 10.4 mg/dL 9.3 9.0 9.2  Total Protein 6.4 - 8.3 g/dL 6.9 6.4 7.1  Total Bilirubin 0.20 - 1.20 mg/dL 0.60 0.5 0.51  Alkaline Phos 40 - 150 U/L 54 57 79  AST 5 - 34 U/L _1 ALT 0 - 55 U/L 27 22 34  .   Lab Results  Component Value Date   FERRITIN 109 08/03/2016          RADIOGRAPHIC STUDIES: I have personally reviewed the radiological images as listed and agreed with the findings in the report. No results found. METASTATIC BONE SURVEY 0/02/2015  COMPARISON: None.  FINDINGS: Heart is normal size. Right diaphragmatic hernia again noted, unchanged. Lungs are clear. No effusions.  No focal lytic lesions within the visualized bony structures or acute bony abnormality. Degenerative changes within the shoulders, lower cervical spine, mid to lower thoracic spine, and lower lumbar spine. Advanced degenerative facet disease in the lower lumbar spine. 9 mm of anterolisthesis of L4 on L5. Endplate sclerosis within L4 and L5. Mild degenerative changes in the hips. Advanced degenerative changes within the knees. 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  IMPRESSION: No focal lytic lesion.  Degenerative joint disease involving multiple joints as described above.  Grade 2 anterolisthesis of L4 on L5 related to facet disease.   ASSESSMENT & PLAN:  71 year old of an American female with  #1 IgG  kappa monoclonal gammopathy of undetermined significance. Bone survey X-ray showed no lytic lesions. M spike is 0.6 g/dL in 10/016 and then 0.5g/dl in 10/2015. SPEP in May/2017 and 07/2016 showed M proteinwas stable at 0.3g/dl  MGUS likely related to underlying connective tissue disorder.  Bone marrow biopsy does show about 13% plasma cells however these appear to be polyclonal and likely related to a possible underlying inflammatory disorder. Less likely a biclonal plasma cell dyscrasia.  #2 Normocytic normochromic anemia likely related to chronic inflammation from recent diagnosis of lupus/rheumatoid arthritis + CKD.  Increased acanthocytes on peripheral blood smear -no overt evidence of liver disease. Could be a marker of some MDS Bone marrow biopsy did not show overt plasma cell  dyscrasia or myelodysplastic syndrome. 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 Bone marrow biopsy showed decreased iron stores.  Patient's hemoglobin level remains stable today at 10.2 With somewhat macrocytic MCV at 99.7.  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 Estanislado Pandy. Plan -Continue optimization of her autoimmune disorder treatment with rheumatology Dr. Estanislado Pandy. --Awaiting ferritin levels today. If more the 100 could cut down her ferrous sulfate to 1 tablet by mouth daily. -She has no symptoms or findings suggestive of multiple myeloma at this time. --will Follow-up on pending labs from today SPEP with QIG/IFE -results pending  -Continue follow-up with primary care physician and rheumatology.  RTC with Dr. Irene Limbo in 6 months with cbc, cmp ferritin and SPEP/QIG/IFE  . Orders Placed This Encounter  Procedures  . CBC & Diff and Retic    Standing Status:   Future    Standing Expiration Date:   02/16/2018  . Comprehensive metabolic panel    Standing Status:   Future    Standing Expiration  Date:   02/16/2018  . Multiple Myeloma Panel (SPEP&IFE w/QIG)    Standing Status:   Future    Standing Expiration Date:   02/16/2018  . Ferritin    Standing Status:   Future    Standing Expiration Date:   02/16/2018    I spent 15 minutes counseling the patient face to face. The total time spent in the appointment was 25 minutes and more than 50% was on counseling and direct patient cares.    Sullivan Lone MD [place] AAHIVMS [facility] Hematology/Oncology Physician [facility]  (Office):       [phone] (Work cell):  [phone] (Fax):           [phone]

## 2017-02-17 LAB — KAPPA/LAMBDA LIGHT CHAINS
Ig Kappa Free Light Chain: 43.9 mg/L — ABNORMAL HIGH (ref 3.3–19.4)
Ig Lambda Free Light Chain: 186.2 mg/L — ABNORMAL HIGH (ref 5.7–26.3)
Kappa/Lambda FluidC Ratio: 0.24 — ABNORMAL LOW (ref 0.26–1.65)

## 2017-02-18 ENCOUNTER — Telehealth: Payer: Self-pay | Admitting: *Deleted

## 2017-02-18 LAB — MULTIPLE MYELOMA PANEL, SERUM
Albumin SerPl Elph-Mcnc: 3.6 g/dL (ref 2.9–4.4)
Albumin/Glob SerPl: 1.3 (ref 0.7–1.7)
Alpha 1: 0.2 g/dL (ref 0.0–0.4)
Alpha2 Glob SerPl Elph-Mcnc: 0.8 g/dL (ref 0.4–1.0)
B-Globulin SerPl Elph-Mcnc: 0.9 g/dL (ref 0.7–1.3)
Gamma Glob SerPl Elph-Mcnc: 1.1 g/dL (ref 0.4–1.8)
Globulin, Total: 3 g/dL (ref 2.2–3.9)
IgA, Qn, Serum: 156 mg/dL (ref 64–422)
IgG, Qn, Serum: 997 mg/dL (ref 700–1600)
IgM, Qn, Serum: 33 mg/dL (ref 26–217)
M Protein SerPl Elph-Mcnc: 0.2 g/dL — ABNORMAL HIGH
Total Protein: 6.6 g/dL (ref 6.0–8.5)

## 2017-02-18 NOTE — Telephone Encounter (Signed)
-----   Message from Brunetta Genera, MD sent at 02/17/2017 10:16 PM EDT ----- Delle Reining, Plz let Ms Glidden know her ferritin level is lower at 38 --- would recommend switching to Iron polysaccharide 150mg  po BID. thx GK

## 2017-02-18 NOTE — Telephone Encounter (Signed)
Per staff message, called and sw patient regarding ferritin levels.  Instructed pt to switch iron supplement to iron polysaccharide to 150mg  BID with orange juice.  Pt verbalized understanding.

## 2017-03-05 ENCOUNTER — Ambulatory Visit: Payer: Commercial Managed Care - HMO | Admitting: Rheumatology

## 2017-03-12 NOTE — Progress Notes (Addendum)
Office Visit Note  Patient: Hannah [name]             Date of Birth: [birth year]           MRN: [id number]             PCP: Glendale Chard, MD Referring: Glendale Chard, MD Visit Date: 03/16/2017 Occupation: @GUAROCC @    Subjective:  Pain hands and knee joints.   History of Present Illness: Hannah [name] is a 71 y.o. female with history of rheumatoid arthritis and lupus overlap. She states she's been having increased pain and stiffness in her hands and her knee joints. She's been having some swelling in her right hand. She would like to have a cortisone injection to her right knee joint as she's been hurting behind her knee. Her left knee joint is doing better. She denies any gout flare. She saw her hematologist lately and was  placed on some iron supplement. She was also seen by nephrologist and was told to return on when necessary basis.  Activities of Daily Living:  Patient reports morning stiffness for 25 minutes.   Patient Denies nocturnal pain.  Difficulty dressing/grooming: Denies Difficulty climbing stairs: Reports Difficulty getting out of chair: Reports Difficulty using hands for taps, buttons, cutlery, and/or writing: Reports   Review of Systems  Constitutional: Positive for fatigue. Negative for night sweats, weight gain, weight loss and weakness.  HENT: Negative for mouth sores, trouble swallowing, trouble swallowing, mouth dryness and nose dryness.   Eyes: Negative for pain, redness, visual disturbance and dryness.  Respiratory: Negative for cough, shortness of breath and difficulty breathing.   Cardiovascular: Positive for hypertension. Negative for chest pain, palpitations, irregular heartbeat and swelling in legs/feet.  Gastrointestinal: Negative for blood in stool, constipation and diarrhea.  Endocrine: Negative for increased urination.  Genitourinary: Negative for vaginal dryness.  Musculoskeletal: Positive for arthralgias, joint pain, joint swelling  and morning stiffness. Negative for myalgias, muscle weakness, muscle tenderness and myalgias.  Skin: Negative for color change, rash, hair loss, skin tightness, ulcers and sensitivity to sunlight.  Allergic/Immunologic: Negative for susceptible to infections.  Neurological: Negative for dizziness, memory loss and night sweats.  Hematological: Negative for swollen glands.  Psychiatric/Behavioral: Positive for sleep disturbance. Negative for depressed mood. The patient is not nervous/anxious.     PMFS History:  Patient Active Problem List   Diagnosis Date Noted  . Osteoporosis 10/02/2016  . Systemic lupus erythematosus ([place]) 10/02/2016  . Rheumatoid arthritis involving multiple sites with positive rheumatoid factor ([place]) 10/02/2016  . High risk medication use 10/02/2016  . History of chronic kidney disease 10/02/2016  . 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/2016  . Primary osteoarthritis of both knees 10/02/2016  . Vitamin D deficiency 10/02/2016  . History of diabetes mellitus 10/02/2016  . History of hypertension 10/02/2016  . History of asthma 10/02/2016  . Absolute anemia   . MGUS (monoclonal gammopathy of unknown significance)   . Normocytic anemia 03/21/2015  . Essential hypertension 03/20/2015  . Abnormal CT of the chest 07/30/2012  . Asthma 06/19/2012    Past Medical History:  Diagnosis Date  . Arthritis   . Asthma   . Diabetes mellitus without complication ([place])   . Gout 12/17/2014   patient reported  . Hyperlipidemia   . Hypertension     Family History  Problem Relation Age of Onset  . Heart disease Father   . Diabetes Father   . Hypertension Mother    History  reviewed. No pertinent surgical history. Social History   Social History Narrative  . No narrative on file     Objective: Vital Signs: BP (!) 149/79   Pulse 71   Resp 12   Ht 4\' 11"  (1.499 m)   Wt 167 lb (75.8 kg)   BMI 33.73 kg/m    Physical Exam  Constitutional:  She is oriented to person, place, and time. She appears well-developed and well-nourished.  HENT:  Head: Normocephalic and atraumatic.  Eyes: Conjunctivae and EOM are normal.  Neck: Normal range of motion.  Cardiovascular: Normal rate, regular rhythm, normal heart sounds and intact distal pulses.   Pulmonary/Chest: Effort normal and breath sounds normal.  Abdominal: Soft. Bowel sounds are normal.  Lymphadenopathy:    She has no cervical adenopathy.  Neurological: She is alert and oriented to person, place, and time.  Skin: Skin is warm and dry. Capillary refill takes less than 2 seconds.  Psychiatric: She has a normal mood and affect. Her behavior is normal.  Nursing note and vitals reviewed.    Musculoskeletal Exam: C-spine and thoracic lumbar spine good range of motion. Shoulder joints elbow joints wrist joints good range of motion. She has no synovitis over her MCP joints. She is thickening of PIP/DIP joints bilaterally consistent with osteoarthritis. Hip joints, ankles MTPs PIPs with good range of motion. She has limited extension of bilateral knee joints without any warmth swelling or effusion.  CDAI Exam: CDAI Homunculus Exam:   Joint Counts:  CDAI Tender Joint count: 0 CDAI Swollen Joint count: 0  Global Assessments:  Patient Global Assessment: 5 Provider Global Assessment: 2  CDAI Calculated Score: 7    Investigation: No additional findings.  CBC    Component Value Date/Time   WBC 4.0 02/16/2017 0829   WBC 4.9 10/05/2016 0852   RBC 3.28 (L) 02/16/2017 0829   RBC 3.22 (L) 10/05/2016 0852   HGB 10.2 (L) 02/16/2017 0829   HCT 31.4 (L) 02/16/2017 0829   PLT 125 (L) 02/16/2017 0829   MCV 95.7 02/16/2017 0829   MCH 31.1 02/16/2017 0829   MCH 31.1 10/05/2016 0852   MCHC 32.5 02/16/2017 0829   MCHC 31.2 (L) 10/05/2016 0852   RDW 14.3 02/16/2017 0829   LYMPHSABS 1.0 02/16/2017 0829   MONOABS 0.4 02/16/2017 0829   EOSABS 0.4 02/16/2017 0829   BASOSABS 0.0  02/16/2017 0829   CMP     Component Value Date/Time   NA 141 02/16/2017 0829   K 3.6 02/16/2017 0829   CL 107 10/05/2016 0852   CO2 27 02/16/2017 0829   GLUCOSE 87 02/16/2017 0829   BUN 22.2 02/16/2017 0829   CREATININE 1.2 (H) 02/16/2017 0829   CALCIUM 9.3 02/16/2017 0829   PROT 6.9 02/16/2017 0829   PROT 6.6 02/16/2017 0829   ALBUMIN 3.9 02/16/2017 0829   AST 25 02/16/2017 0829   ALT 27 02/16/2017 0829   ALKPHOS 54 02/16/2017 0829   BILITOT 0.60 02/16/2017 0829   GFRNONAA 50 (L) 10/05/2016 0852   GFRAA 57 (L) 10/05/2016 0852   Imaging: No results found.  Speciality Comments: No specialty comments available.    Procedures:  Large Joint Inj Date/Time: 03/16/2017 3:20 PM Performed by: Bo Merino Authorized by: Bo Merino   Consent Given by:  Patient Site marked: the procedure site was marked   Timeout: prior to procedure the correct patient, procedure, and site was verified   Indications:  Pain and joint swelling Location:  Knee Site:  R knee Prep:  patient was prepped and draped in usual sterile fashion   Needle Size:  27 G Needle Length:  1.5 inches Approach:  Medial Ultrasound Guidance: No   Fluoroscopic Guidance: No   Arthrogram: No   Medications:  40 mg triamcinolone acetonide 40 MG/ML; 1.5 mL lidocaine 1 % Aspiration Attempted: Yes   Aspirate amount (mL):  0 Patient tolerance:  Patient tolerated the procedure well with no immediate complications    Allergies: Shellfish allergy   Assessment / Plan:     Visit Diagnoses: Rheumatoid arthritis involving multiple sites with positive rheumatoid factor (HCC) - Positive RF, positive anti-CCP. Patient has no synovitis on examination. She seems to be doing quite well on low-dose Plaquenil.  Other systemic lupus erythematosus with other organ involvement ([facility]CC) - Positive ANA, positive Smith, positive RNP. Her labs have been stable.  High risk medication use - Plaquenil 200 mg every other day. Her  labs are normal. She states her eye exam is been normal. I will check her autoimmune labs next visit.  Age-related osteoporosis without current pathological fracture -  T score -3.3, BMD 0.488 07/27/2016 which is decreased by -10% from 07/09/2014. She is on Fosamax.   Idiopathic chronic gout of multiple sites without tophus. He is at no flare of his gout - On allopurinol 100 mg by mouth daily  History of chronic kidney disease - GFR in 50s  Primary osteoarthritis of both knees: She has limited extension of her bilateral knee joints. She's having discomfort in her right knee. She requested a cortisone injection. After informed consent was obtained different treatment options were discussed the right knee joint was injected with cortisone as described above.  Vitamin D deficiency: She's been taking vitamin D supplements and will check her vitamin D level with the next visit.  History of diabetes mellitus: Have advised her to monitor blood sugar closely.  History of hypertension: Her blood pressure was slightly elevated today. I have advised her to monitor blood pressure closely  History of asthma  History of anemia: She started taking iron supplements per recommendation of her hematologist.  MGUS (monoclonal gammopathy of unknown significance) : Followed up by Dr. Velvet Bathe.   Orders: Orders Placed This Encounter  Procedures  . Large Joint Injection/Arthrocentesis   Meds ordered this encounter  Medications  . alendronate (FOSAMAX) 70 MG tablet    Sig: Take 1 tablet (70 mg total) by mouth once a week. Take with a full glass of water on an empty stomach.    Dispense:  12 tablet    Refill:  1    Face-to-face time spent with patient was 30 minutes. 50% of time was spent in counseling and coordination of care.  Follow-Up Instructions: Return in about 5 months (around 08/16/2017) for RA, Gout, OP , OA.   Bo Merino, MD  Note - This record has been created using Editor, commissioning.    Chart creation errors have been sought, but may not always  have been located. Such creation errors do not reflect on  the standard of medical care.

## 2017-03-16 ENCOUNTER — Ambulatory Visit (INDEPENDENT_AMBULATORY_CARE_PROVIDER_SITE_OTHER): Payer: Commercial Managed Care - HMO | Admitting: Rheumatology

## 2017-03-16 ENCOUNTER — Encounter: Payer: Self-pay | Admitting: Rheumatology

## 2017-03-16 VITALS — BP 149/79 | HR 71 | Resp 12 | Ht 59.0 in | Wt 167.0 lb

## 2017-03-16 DIAGNOSIS — M810 Age-related osteoporosis without current pathological fracture: Secondary | ICD-10-CM | POA: Diagnosis not present

## 2017-03-16 DIAGNOSIS — Z79899 Other long term (current) drug therapy: Secondary | ICD-10-CM

## 2017-03-16 DIAGNOSIS — Z862 Personal history of diseases of the blood and blood-forming organs and certain disorders involving the immune mechanism: Secondary | ICD-10-CM | POA: Diagnosis not present

## 2017-03-16 DIAGNOSIS — M0579 Rheumatoid arthritis with rheumatoid factor of multiple sites without organ or systems involvement: Secondary | ICD-10-CM | POA: Diagnosis not present

## 2017-03-16 DIAGNOSIS — Z8639 Personal history of other endocrine, nutritional and metabolic disease: Secondary | ICD-10-CM | POA: Diagnosis not present

## 2017-03-16 DIAGNOSIS — M3219 Other organ or system involvement in systemic lupus erythematosus: Secondary | ICD-10-CM

## 2017-03-16 DIAGNOSIS — E559 Vitamin D deficiency, unspecified: Secondary | ICD-10-CM

## 2017-03-16 DIAGNOSIS — M1711 Unilateral primary osteoarthritis, right knee: Secondary | ICD-10-CM | POA: Diagnosis not present

## 2017-03-16 DIAGNOSIS — D472 Monoclonal gammopathy: Secondary | ICD-10-CM

## 2017-03-16 DIAGNOSIS — Z87448 Personal history of other diseases of urinary system: Secondary | ICD-10-CM

## 2017-03-16 DIAGNOSIS — M1A09X0 Idiopathic chronic gout, multiple sites, without tophus (tophi): Secondary | ICD-10-CM | POA: Diagnosis not present

## 2017-03-16 DIAGNOSIS — Z8679 Personal history of other diseases of the circulatory system: Secondary | ICD-10-CM | POA: Diagnosis not present

## 2017-03-16 DIAGNOSIS — Z8709 Personal history of other diseases of the respiratory system: Secondary | ICD-10-CM

## 2017-03-16 DIAGNOSIS — M170 Bilateral primary osteoarthritis of knee: Secondary | ICD-10-CM

## 2017-03-16 DIAGNOSIS — M069 Rheumatoid arthritis, unspecified: Secondary | ICD-10-CM

## 2017-03-16 MED ORDER — ALENDRONATE SODIUM 70 MG PO TABS: 70.0000 mg | ORAL_TABLET | ORAL | 1 refills | Status: DC

## 2017-03-16 MED ORDER — HYDROXYCHLOROQUINE SULFATE 200 MG PO TABS: 200.0000 mg | ORAL_TABLET | Freq: Every day | ORAL | 1 refills | Status: DC

## 2017-03-16 MED ORDER — TRIAMCINOLONE ACETONIDE 40 MG/ML IJ SUSP
40.0000 mg | INTRAMUSCULAR | Status: AC | PRN
  Administered 2017-03-16: 40 mg via INTRA_ARTICULAR

## 2017-03-16 MED ORDER — LIDOCAINE HCL 1 % IJ SOLN
1.5000 mL | INTRAMUSCULAR | Status: AC | PRN
  Administered 2017-03-16: 1.5 mL

## 2017-03-24 DIAGNOSIS — F4321 Adjustment disorder with depressed mood: Secondary | ICD-10-CM | POA: Diagnosis not present

## 2017-03-24 DIAGNOSIS — N08 Glomerular disorders in diseases classified elsewhere: Secondary | ICD-10-CM | POA: Diagnosis not present

## 2017-03-24 DIAGNOSIS — N183 Chronic kidney disease, stage 3 (moderate): Secondary | ICD-10-CM | POA: Diagnosis not present

## 2017-03-24 DIAGNOSIS — E1122 Type 2 diabetes mellitus with diabetic chronic kidney disease: Secondary | ICD-10-CM | POA: Diagnosis not present

## 2017-03-24 DIAGNOSIS — I129 Hypertensive chronic kidney disease with stage 1 through stage 4 chronic kidney disease, or unspecified chronic kidney disease: Secondary | ICD-10-CM | POA: Diagnosis not present

## 2017-03-24 DIAGNOSIS — Z1389 Encounter for screening for other disorder: Secondary | ICD-10-CM | POA: Diagnosis not present

## 2017-03-24 DIAGNOSIS — Z0000 Encounter for general adult medical examination without abnormal findings: Secondary | ICD-10-CM | POA: Diagnosis not present

## 2017-03-24 DIAGNOSIS — E559 Vitamin D deficiency, unspecified: Secondary | ICD-10-CM | POA: Diagnosis not present

## 2017-04-12 DIAGNOSIS — E039 Hypothyroidism, unspecified: Secondary | ICD-10-CM | POA: Diagnosis not present

## 2017-04-12 DIAGNOSIS — E119 Type 2 diabetes mellitus without complications: Secondary | ICD-10-CM | POA: Diagnosis not present

## 2017-06-16 ENCOUNTER — Telehealth: Payer: Self-pay | Admitting: Rheumatology

## 2017-06-16 NOTE — Telephone Encounter (Signed)
Patient advised okay to wait to take Fosamax until they have it back in stock at the pharmacy. Patient verbalized understanding.

## 2017-06-16 NOTE — Telephone Encounter (Signed)
Ok to wait

## 2017-06-16 NOTE — Telephone Encounter (Signed)
Patient states Alendronate 70mg  is on back order, and will not be available for 1 month. Patient is due to take this on Saturday, and is completely out. Does patient need to take something else, or is it okay to wait for meds 1 month from now. Please call patient to inform. Patient uses [facility] on [address]

## 2017-06-28 DIAGNOSIS — N08 Glomerular disorders in diseases classified elsewhere: Secondary | ICD-10-CM | POA: Diagnosis not present

## 2017-06-28 DIAGNOSIS — M109 Gout, unspecified: Secondary | ICD-10-CM | POA: Diagnosis not present

## 2017-06-28 DIAGNOSIS — E1122 Type 2 diabetes mellitus with diabetic chronic kidney disease: Secondary | ICD-10-CM | POA: Diagnosis not present

## 2017-06-28 DIAGNOSIS — N183 Chronic kidney disease, stage 3 (moderate): Secondary | ICD-10-CM | POA: Diagnosis not present

## 2017-06-28 DIAGNOSIS — I129 Hypertensive chronic kidney disease with stage 1 through stage 4 chronic kidney disease, or unspecified chronic kidney disease: Secondary | ICD-10-CM | POA: Diagnosis not present

## 2017-06-28 DIAGNOSIS — E039 Hypothyroidism, unspecified: Secondary | ICD-10-CM | POA: Diagnosis not present

## 2017-06-28 DIAGNOSIS — Z23 Encounter for immunization: Secondary | ICD-10-CM | POA: Diagnosis not present

## 2017-08-02 DIAGNOSIS — M109 Gout, unspecified: Secondary | ICD-10-CM | POA: Diagnosis not present

## 2017-08-02 DIAGNOSIS — N183 Chronic kidney disease, stage 3 (moderate): Secondary | ICD-10-CM | POA: Diagnosis not present

## 2017-08-02 DIAGNOSIS — I129 Hypertensive chronic kidney disease with stage 1 through stage 4 chronic kidney disease, or unspecified chronic kidney disease: Secondary | ICD-10-CM | POA: Diagnosis not present

## 2017-08-02 DIAGNOSIS — E039 Hypothyroidism, unspecified: Secondary | ICD-10-CM | POA: Diagnosis not present

## 2017-08-10 DIAGNOSIS — E119 Type 2 diabetes mellitus without complications: Secondary | ICD-10-CM | POA: Diagnosis not present

## 2017-08-10 DIAGNOSIS — E039 Hypothyroidism, unspecified: Secondary | ICD-10-CM | POA: Diagnosis not present

## 2017-08-11 ENCOUNTER — Encounter: Payer: Self-pay | Admitting: Obstetrics & Gynecology

## 2017-08-11 ENCOUNTER — Ambulatory Visit (INDEPENDENT_AMBULATORY_CARE_PROVIDER_SITE_OTHER): Payer: Medicare HMO | Admitting: Obstetrics & Gynecology

## 2017-08-11 VITALS — BP 144/80 | Ht <= 58 in | Wt 169.0 lb

## 2017-08-11 DIAGNOSIS — D229 Melanocytic nevi, unspecified: Secondary | ICD-10-CM | POA: Diagnosis not present

## 2017-08-11 DIAGNOSIS — Z01419 Encounter for gynecological examination (general) (routine) without abnormal findings: Secondary | ICD-10-CM

## 2017-08-11 DIAGNOSIS — Z780 Asymptomatic menopausal state: Secondary | ICD-10-CM

## 2017-08-11 DIAGNOSIS — M810 Age-related osteoporosis without current pathological fracture: Secondary | ICD-10-CM

## 2017-08-11 NOTE — Progress Notes (Signed)
TAMASHA LAPLANTE May 23, 1946 [id number]   History:    71 y.o. G1P1L0  Single.  Very active with her sister at [facility].  RP:  Established patient presenting for annual gyn exam   HPI:  Menopause. No HRT.  No PMB.  No pelvic pain.  Breasts wnl.  Urine/BMs wnl.  Chronic Anemia, will see [facility] tomorrow.  No recent skin exam, many moles.  Fam MD Dr Baird Cancer for DM, cHTN, Asthma...  Past medical history,surgical history, family history and social history were all reviewed and documented in the EPIC chart.  Gynecologic History No LMP recorded. Patient is postmenopausal. Contraception: post menopausal status Last Pap: 06/2016. Results were: Negative Last mammogram: 2018.  Results were: normal per patient, will obtain from [facility] 06/2016:  Osteoporosis Spine T score -3.2 [place] 2014  Obstetric History OB History  Gravida Para Term Preterm AB Living  1 1       0  SAB TAB Ectopic Multiple Live Births               # Outcome Date GA Lbr Len/2nd Weight Sex Delivery Anes PTL Lv  1 Para             Obstetric Comments  Baby died from CHF     ROS: A ROS was performed and pertinent positives and negatives are included in the history.  GENERAL: No fevers or chills. HEENT: No change in vision, no earache, sore throat or sinus congestion. NECK: No pain or stiffness. CARDIOVASCULAR: No chest pain or pressure. No palpitations. PULMONARY: No shortness of breath, cough or wheeze. GASTROINTESTINAL: No abdominal pain, nausea, vomiting or diarrhea, melena or bright red blood per rectum. GENITOURINARY: No urinary frequency, urgency, hesitancy or dysuria. MUSCULOSKELETAL: No joint or muscle pain, no back pain, no recent trauma. DERMATOLOGIC: No rash, no itching, no lesions. ENDOCRINE: No polyuria, polydipsia, no heat or cold intolerance. No recent change in weight. HEMATOLOGICAL: No anemia or easy bruising or bleeding. NEUROLOGIC: No headache, seizures, numbness, tingling or weakness. PSYCHIATRIC: No  depression, no loss of interest in normal activity or change in sleep pattern.     Exam:   BP (!) 144/80   Ht 4\' 10"  (1.473 m)   Wt 169 lb (76.7 kg)   BMI 35.32 kg/m   Body mass index is 35.32 kg/m.  General appearance : Well developed well nourished female. No acute distress HEENT: Eyes: no retinal hemorrhage or exudates,  Neck supple, trachea midline, no carotid bruits, no thyroidmegaly Lungs: Clear to auscultation, no rhonchi or wheezes, or rib retractions  Heart: Regular rate and rhythm, no murmurs or gallops Breast:Examined in sitting and supine position were symmetrical in appearance, no palpable masses or tenderness,  no skin retraction, no nipple inversion, no nipple discharge, no skin discoloration, no axillary or supraclavicular lymphadenopathy Abdomen: no palpable masses or tenderness, no rebound or guarding Extremities: no edema or skin discoloration or tenderness  Pelvic: Vulva normal  Bartholin, Urethra, Skene Glands: Within normal limits             Vagina: No gross lesions or discharge  Cervix: No gross lesions or discharge  Uterus  AV, normal size, shape and consistency, non-tender and mobile  Adnexa  Without masses or tenderness  Anus and perineum  normal    Assessment/Plan:  71 y.o. female for annual exam   1. Well female exam with routine gynecological exam Normal gyn exam.  Pap neg 06/2016.  Breasts wnl.  Will obtain report from [facility]  Mammo 2018.  [place] 2014.  2. Menopause present Menopause.  Well without HRT.  No postmenopausal bleeding.  3. Age-related osteoporosis without current pathological fracture On Fosamax.  Last Bone Density 06/2016.  Continue with vitamin D supplements, calcium rich diet and regular weightbearing physical activity.  4. Numerous moles Refer to [facility] for full skin exam.  Counseling on above issues >50% x 10 minutes  Princess Bruins MD, 11:30 AM 08/11/2017

## 2017-08-12 ENCOUNTER — Telehealth: Payer: Self-pay

## 2017-08-12 ENCOUNTER — Ambulatory Visit: Payer: Medicare HMO | Admitting: Hematology

## 2017-08-12 ENCOUNTER — Telehealth: Payer: Self-pay | Admitting: *Deleted

## 2017-08-12 ENCOUNTER — Other Ambulatory Visit (HOSPITAL_BASED_OUTPATIENT_CLINIC_OR_DEPARTMENT_OTHER): Payer: Medicare HMO

## 2017-08-12 ENCOUNTER — Encounter: Payer: Self-pay | Admitting: Hematology

## 2017-08-12 VITALS — BP 159/66 | HR 71 | Temp 98.5°F | Resp 17 | Ht <= 58 in | Wt 169.3 lb

## 2017-08-12 DIAGNOSIS — D509 Iron deficiency anemia, unspecified: Secondary | ICD-10-CM | POA: Insufficient documentation

## 2017-08-12 DIAGNOSIS — D508 Other iron deficiency anemias: Secondary | ICD-10-CM

## 2017-08-12 DIAGNOSIS — D472 Monoclonal gammopathy: Secondary | ICD-10-CM | POA: Diagnosis not present

## 2017-08-12 LAB — CBC & DIFF AND RETIC
BASO%: 0.5 % (ref 0.0–2.0)
Basophils Absolute: 0 10*3/uL (ref 0.0–0.1)
EOS%: 9.2 % — ABNORMAL HIGH (ref 0.0–7.0)
Eosinophils Absolute: 0.4 10*3/uL (ref 0.0–0.5)
HCT: 30.7 % — ABNORMAL LOW (ref 34.8–46.6)
HGB: 9.9 g/dL — ABNORMAL LOW (ref 11.6–15.9)
Immature Retic Fract: 8.8 % (ref 1.60–10.00)
LYMPH%: 22.5 % (ref 14.0–49.7)
MCH: 31.4 pg (ref 25.1–34.0)
MCHC: 32.2 g/dL (ref 31.5–36.0)
MCV: 97.5 fL (ref 79.5–101.0)
MONO#: 0.3 10*3/uL (ref 0.1–0.9)
MONO%: 8.2 % (ref 0.0–14.0)
NEUT#: 2.5 10*3/uL (ref 1.5–6.5)
NEUT%: 59.6 % (ref 38.4–76.8)
Platelets: 114 10*3/uL — ABNORMAL LOW (ref 145–400)
RBC: 3.15 10*6/uL — ABNORMAL LOW (ref 3.70–5.45)
RDW: 14 % (ref 11.2–14.5)
Retic %: 1.63 % (ref 0.70–2.10)
Retic Ct Abs: 51.35 10*3/uL (ref 33.70–90.70)
WBC: 4.1 10*3/uL (ref 3.9–10.3)
lymph#: 0.9 10*3/uL (ref 0.9–3.3)

## 2017-08-12 LAB — COMPREHENSIVE METABOLIC PANEL
ALT: 38 U/L (ref 0–55)
AST: 31 U/L (ref 5–34)
Albumin: 3.7 g/dL (ref 3.5–5.0)
Alkaline Phosphatase: 51 U/L (ref 40–150)
Anion Gap: 9 mEq/L (ref 3–11)
BUN: 20.8 mg/dL (ref 7.0–26.0)
CO2: 27 mEq/L (ref 22–29)
Calcium: 10.3 mg/dL (ref 8.4–10.4)
Chloride: 106 mEq/L (ref 98–109)
Creatinine: 1.4 mg/dL — ABNORMAL HIGH (ref 0.6–1.1)
EGFR: 44 mL/min/{1.73_m2} — ABNORMAL LOW (ref >60)
Glucose: 85 mg/dl (ref 70–140)
Potassium: 3.7 mEq/L (ref 3.5–5.1)
Sodium: 142 mEq/L (ref 136–145)
Total Bilirubin: 0.45 mg/dL (ref 0.20–1.20)
Total Protein: 6.8 g/dL (ref 6.4–8.3)

## 2017-08-12 LAB — FERRITIN: Ferritin: 21 ng/ml (ref 9–269)

## 2017-08-12 NOTE — Telephone Encounter (Signed)
Printed avs and calender for up coming appointment per orders 11/15 los

## 2017-08-12 NOTE — Telephone Encounter (Signed)
Pt informed

## 2017-08-12 NOTE — Patient Instructions (Signed)
Thank you for choosing [facility] to provide your oncology and hematology care.  To afford each patient quality time with our providers, please arrive 30 minutes before your scheduled appointment time.  If you arrive late for your appointment, you may be asked to reschedule.  We strive to give you quality time with our providers, and arriving late affects you and other patients whose appointments are after yours.   If you are a no show for multiple scheduled visits, you may be dismissed from the clinic at the providers discretion.    Again, thank you for choosing [facility], our hope is that these requests will decrease the amount of time that you wait before being seen by our physicians.  ______________________________________________________________________  Should you have questions after your visit to the [facility], please contact our office at [phone] between the hours of 8:30 and 4:30 p.m.    Voicemails left after 4:30p.m will not be returned until the following business day.    For prescription refill requests, please have your pharmacy contact us directly.  Please also try to allow 48 hours for prescription requests.    Please contact the scheduling department for questions regarding scheduling.  For scheduling of procedures such as PET scans, CT scans, MRI, Ultrasound, etc please contact central scheduling at [phone].    Resources For Cancer Patients and Caregivers:   Oncolink.org:  A wonderful resource for patients and healthcare providers for information regarding your disease, ways to tract your treatment, what to expect, etc.     American Cancer Society:  800-227-2345  Can help patients locate various types of support and financial assistance  Cancer Care: 1-800-813-HOPE (4673) Provides financial assistance, online support groups, medication/co-pay assistance.    [place] DSS:  336-641-3447 Where to apply for food  stamps, Medicaid, and utility assistance  Medicare Rights Center: 800-333-4114 Helps people with Medicare understand their rights and benefits, navigate the Medicare system, and secure the quality healthcare they deserve  [employer]: 336-333-6589  [employer]'s shared-ride transportation service for eligible riders who have a disability that prevents them from riding the fixed route bus.    For additional information on assistance programs please contact our social worker:   Grier Hock/Abigail Elmore:  [phone]            

## 2017-08-12 NOTE — Telephone Encounter (Signed)
Error pt is scheduled on 09/30/17

## 2017-08-12 NOTE — Telephone Encounter (Signed)
Appointment on 10/28/17 @ 9:15am at [facility], notes faxed, left message for pt to call.

## 2017-08-12 NOTE — Progress Notes (Signed)
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 She notes no acute new symptoms. Her TSH levels have been stable recently, per her recent internist visit. They recently increased her dose from 58m to 1029mdaily M-Sat and on Sun she takes 5068mf this. She continues to also take polysaccharide iron replacement BID which she has tolerated well. Energy levels have been stable, she does note she will become mildly fatigued sometimes but she also states that her sleeping patterns have been off recently. She continues to be on Plaquenil for her lupus/RA. No new bone pains, she does note some stiffness into her fingers but this [name] manageable with hand exercises. Her lab shows stable anemia. Stable creatinine. No hypercalcemia. No other acute new concerns. No recent change in bowel habits. She [name] UTD on her colonoscopy. No recent bruising or bleeding issues.   On review of systems, pt denies fever, chills, rash, mouth sores, weight loss, decreased appetite, urinary complaints. Denies pain. Pt denies abdominal pain, nausea, vomiting. Notable for that in the above HPI.   REVIEW OF SYSTEMS:    A 10+ POINT REVIEW OF SYSTEMS WAS OBTAINED including neurology, dermatology, psychiatry, cardiac, respiratory, lymph, extremities, GI, GU, Musculoskeletal, constitutional, breasts, reproductive, HEENT.  All pertinent positives are noted in the HPI.  All others are negative. . Past Medical History:  Diagnosis Date  . Arthritis   . Asthma   . Diabetes mellitus without  complication ([facility] . Gout 12/17/2014   patient reported  . Hyperlipidemia   . Hypertension   possible SLE/rheumatoid arthritis following with Dr.Deveshwar.  . Past Surgical History:  Procedure Laterality Date  . CATARACT EXTRACTION Bilateral 2015  . DILATION AND CURETTAGE OF UTERUS      . Social History   Tobacco Use  . Smoking status: Never Smoker  . Smokeless tobacco: Never Used  Substance Use Topics  . Alcohol use: Yes    Comment: occasional  . Drug use: No    ALLERGIES:  [name] allergic to shellfish allergy.  MEDICATIONS:  Current Outpatient Medications  Medication Sig Dispense Refill  . alendronate (FOSAMAX) 70 MG tablet Take 1 tablet (70 mg total) by mouth once a week. Take with a full glass of water on an empty stomach. 12 tablet 1  . allopurinol (ZYLOPRIM) 100 MG tablet Take 100 mg by mouth daily.    . aMarland Kitchenpirin EC 81 MG tablet Take 81 mg by mouth daily.    . aMarland Kitchenorvastatin (LIPITOR) 80 MG tablet Take 80 mg by mouth daily.    . budesonide-formoterol (SYMBICORT) 160-4.5 MCG/ACT inhaler Take 2 puffs first thing in am and then another 2 puffs about 12 hours later. 6 g 11  . Calcium Carb-Cholecalciferol (CALCIUM 600+D) 600-800 MG-UNIT TABS Take by mouth.    . Cholecalciferol (VITAMIN D PO) Take 1 capsule by mouth daily.    . hydroxychloroquine (PLAQUENIL) 200 MG tablet Take 1 tablet (200 mg total) by mouth daily. 90 tablet 1  . irbesartan (AVAPRO) 300 MG tablet Take 300 mg daily by mouth.    .Hannah [name]  iron polysaccharides (NIFEREX) 150 MG capsule Take 150 mg 2 (two) times daily by mouth.    . levothyroxine (SYNTHROID, LEVOTHROID) 50 MCG tablet Take 50 mcg daily before breakfast by mouth. Sunday ONLY    . LUBRICANT EYE DROPS 0.4-0.3 % SOLN     . metFORMIN (GLUCOPHAGE) 500 MG tablet Take 500 mg daily by mouth.     . SYNTHROID 100 MCG tablet TK 1 T PO  QD.  1  . VENTOLIN HFA 108 (90 Base) MCG/ACT inhaler INL 2 PFS PO Q 4 TO 6 H PRN  5   No current facility-administered medications  for this visit.     PHYSICAL EXAMINATION: ECOG PERFORMANCE STATUS: 1 - Symptomatic but completely ambulatory  . Vitals:   08/12/17 0857  BP: (!) 159/66  Pulse: 71  Resp: 17  Temp: 98.5 F (36.9 C)  SpO2: 95%    Filed Weights   08/12/17 0857  Weight: 169 lb 4.8 oz (76.8 kg)   .Body mass index [name] 35.38 kg/m.  GENERAL:alert, in no acute distress and comfortable SKIN: skin color, texture, turgor are normal, no rashes or significant lesions EYES: normal, conjunctiva are pink and non-injected, sclera clear OROPHARYNX:no exudate, no erythema and lips, buccal mucosa, and tongue normal  NECK: supple, no JVD, thyroid normal size, non-tender, without nodularity LYMPH:  no palpable lymphadenopathy in the cervical, axillary or inguinal LUNGS: clear to auscultation with normal respiratory effort HEART: regular rate & rhythm,  no murmurs and no lower extremity edema ABDOMEN: abdomen soft, non-tender, normoactive bowel sounds  Musculoskeletal: no cyanosis of digits and no clubbing  PSYCH: alert & oriented x 3 with fluent speech NEURO: no focal motor/sensory deficits  LABORATORY DATA:   I have reviewed the data as listed  . CBC Latest Ref Rng & Units 08/12/2017 02/16/2017 10/05/2016  WBC 3.9 - 10.3 10e3/uL 4.1 4.0 4.9  Hemoglobin 11.6 - 15.9 g/dL 9.9(L) 10.2(L) 10.0(L)  Hematocrit 34.8 - 46.6 % 30.7(L) 31.4(L) 32.1(L)  Platelets 145 - 400 10e3/uL 114(L) 125(L) 226    . CMP Latest Ref Rng & Units 08/12/2017 02/16/2017 02/16/2017  Glucose 70 - 140 mg/dl 85 87 -  BUN 7.0 - 26.0 mg/dL 20.8 22.2 -  Creatinine 0.6 - 1.1 mg/dL 1.4(H) 1.2(H) -  Sodium 136 - 145 mEq/L 142 141 -  Potassium 3.5 - 5.1 mEq/L 3.7 3.6 -  Chloride 98 - 110 mmol/L - - -  CO2 22 - 29 mEq/L 27 27 -  Calcium 8.4 - 10.4 mg/dL 10.3 9.3 -  Total Protein 6.4 - 8.3 g/dL 6.8 6.9 6.6  Total Bilirubin 0.20 - 1.20 mg/dL 0.45 0.60 -  Alkaline Phos 40 - 150 U/L 51 54 -  AST 5 - 34 U/L 31 25 -  ALT 0 - 55 U/L 38 27 -   .   Lab Results  Component Value Date   FERRITIN 21 08/12/2017          RADIOGRAPHIC STUDIES: I have personally reviewed the radiological images as listed and agreed with the findings in the report. No results found. METASTATIC BONE SURVEY 0/02/2015  COMPARISON: None.  FINDINGS: Heart [name] normal size. Right diaphragmatic hernia again noted, unchanged. Lungs are clear. No effusions.  No focal lytic lesions within the visualized bony structures or acute bony abnormality. Degenerative changes within the shoulders, lower cervical spine, mid to lower thoracic spine, and lower lumbar spine. Advanced degenerative facet disease in the lower lumbar spine. 9 mm of anterolisthesis of L4 on L5.  Endplate sclerosis within L4 and L5. Mild degenerative changes in the hips. Advanced degenerative changes within the knees. 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  IMPRESSION: No focal lytic lesion.  Degenerative joint disease involving multiple joints as described above.  Grade 2 anterolisthesis of L4 on L5 related to facet disease.   ASSESSMENT & PLAN:   71 year old of an American female with  #1 IgG kappa monoclonal gammopathy of undetermined significance. Bone survey X-ray showed no lytic lesions. M spike [name] 0.6 g/dL in 10/016 and then 0.5g/dl in 10/2015. SPEP in May/2017 and 07/2016 showed M proteinwas stable at 0.3g/dl  MGUS likely related to underlying connective tissue disorder.  Bone marrow biopsy does show about 13% plasma cells however these appear to be polyclonal and likely related to a possible underlying inflammatory disorder. Less likely a biclonal plasma cell dyscrasia.  #2 Normocytic normochromic anemia likely related to chronic inflammation from recent diagnosis of lupus/rheumatoid arthritis + CKD.  Increased acanthocytes on peripheral blood smear -no overt evidence of liver disease. Could be a marker of some MDS Bone marrow biopsy  did not show overt plasma cell dyscrasia or myelodysplastic syndrome. 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 Bone marrow biopsy showed decreased iron stores.  Patient's hemoglobin level remains stable today at 9.9  With somewhat macrocytic MCV at 97.5. Ferritin and iron have been trending down, she has not noticed any issues with bruising or bleeding recently. ?absorption issue. I have given her the option of IV iron which she has previously had without issue. She would like to have this.   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 Estanislado Pandy. Plan -Continue optimization of her autoimmune disorder treatment with rheumatology Dr. Estanislado Pandy. --ferritin levels down to 21 despite po iron -Schedule for IV Injectafer weekly x 2 doses starting 1st week of december  -She has no symptoms or findings suggestive of multiple myeloma at this time. --will Follow-up on pending labs from today SPEP with [facility] -results pending  -Continue follow-up with primary care physician and rheumatology.  RTC with Dr. Irene Limbo in 4 months with labs Schedule for IV Injectafer weekly x 2 doses starting 1st week of december  Orders Placed This Encounter  Procedures  . CBC & Diff and Retic    Standing Status:   Future    Standing Expiration Date:   08/12/2018  . Comprehensive metabolic panel    Standing Status:   Future    Standing Expiration Date:   08/12/2018  . Ferritin    Standing Status:   Future    Standing Expiration Date:   08/12/2018    I spent 20 minutes counseling the patient face to face. The total time spent in the appointment was 25 minutes and more than 50% was on counseling and direct patient cares.  Sullivan Lone MD [place] AAHIVMS [facility] Hematology/Oncology Physician [place]  (Office):       [phone] (Work cell):  [phone] (Fax):           [phone]  This  document serves as a record of services personally performed by Sullivan Lone, MD. It was created on his behalf by Reola Mosher, a trained medical scribe. The creation of this record [name] based on the scribe's personal observations and the provider's statements to them.   .I have reviewed the above documentation for accuracy and completeness, and I agree with  the above. Brunetta Genera MD MS

## 2017-08-12 NOTE — Telephone Encounter (Signed)
-----   Message from Princess Bruins, MD sent at 08/11/2017 11:53 AM EST ----- Regarding: Refer to [facility] Numerous moles.  Needs full skin exam.

## 2017-08-15 ENCOUNTER — Encounter: Payer: Self-pay | Admitting: Obstetrics & Gynecology

## 2017-08-15 NOTE — Patient Instructions (Signed)
1. Well female exam with routine gynecological exam Normal gyn exam.  Pap neg 06/2016.  Breasts wnl.  Will obtain report from [facility] 2018.  [place] 2014.  2. Menopause present Menopause.  Well without HRT.  No postmenopausal bleeding.  3. Age-related osteoporosis without current pathological fracture On Fosamax.  Last Bone Density 06/2016.  Continue with vitamin D supplements, calcium rich diet and regular weightbearing physical activity.  4. Numerous moles Refer to [facility] for full skin exam.  Hannah [name], it was a pleasure seeing you today!  You will receive a phone call to schedule your referral with dermatology.   Health Maintenance for Postmenopausal Women Menopause is a normal process in which your reproductive ability comes to an end. This process happens gradually over a span of months to years, usually between the ages of 38 and 45. Menopause is complete when you have missed 12 consecutive menstrual periods. It is important to talk with your health care provider about some of the most common conditions that affect postmenopausal women, such as heart disease, cancer, and bone loss (osteoporosis). Adopting a healthy lifestyle and getting preventive care can help to promote your health and wellness. Those actions can also lower your chances of developing some of these common conditions. What should I know about menopause? During menopause, you may experience a number of symptoms, such as:  Moderate-to-severe hot flashes.  Night sweats.  Decrease in sex drive.  Mood swings.  Headaches.  Tiredness.  Irritability.  Memory problems.  Insomnia.  Choosing to treat or not to treat menopausal changes is an individual decision that you make with your health care provider. What should I know about hormone replacement therapy and supplements? Hormone therapy products are effective for treating symptoms that are associated with menopause, such as hot flashes and night sweats.  Hormone replacement carries certain risks, especially as you become older. If you are thinking about using estrogen or estrogen with progestin treatments, discuss the benefits and risks with your health care provider. What should I know about heart disease and stroke? Heart disease, heart attack, and stroke become more likely as you age. This may be due, in part, to the hormonal changes that your body experiences during menopause. These can affect how your body processes dietary fats, triglycerides, and cholesterol. Heart attack and stroke are both medical emergencies. There are many things that you can do to help prevent heart disease and stroke:  Have your blood pressure checked at least every 1-2 years. High blood pressure causes heart disease and increases the risk of stroke.  If you are 23-47 years old, ask your health care provider if you should take aspirin to prevent a heart attack or a stroke.  Do not use any tobacco products, including cigarettes, chewing tobacco, or electronic cigarettes. If you need help quitting, ask your health care provider.  It is important to eat a healthy diet and maintain a healthy weight. ? Be sure to include plenty of vegetables, fruits, low-fat dairy products, and lean protein. ? Avoid eating foods that are high in solid fats, added sugars, or salt (sodium).  Get regular exercise. This is one of the most important things that you can do for your health. ? Try to exercise for at least 150 minutes each week. The type of exercise that you do should increase your heart rate and make you sweat. This is known as moderate-intensity exercise. ? Try to do strengthening exercises at least twice each week. Do these in addition to the  moderate-intensity exercise.  Know your numbers.Ask your health care provider to check your cholesterol and your blood glucose. Continue to have your blood tested as directed by your health care provider.  What should I know about cancer  screening? There are several types of cancer. Take the following steps to reduce your risk and to catch any cancer development as early as possible. Breast Cancer  Practice breast self-awareness. ? This means understanding how your breasts normally appear and feel. ? It also means doing regular breast self-exams. Let your health care provider know about any changes, no matter how small.  If you are 73 or older, have a clinician do a breast exam (clinical breast exam or CBE) every year. Depending on your age, family history, and medical history, it may be recommended that you also have a yearly breast X-ray (mammogram).  If you have a family history of breast cancer, talk with your health care provider about genetic screening.  If you are at high risk for breast cancer, talk with your health care provider about having an MRI and a mammogram every year.  Breast cancer (BRCA) gene test is recommended for women who have family members with BRCA-related cancers. Results of the assessment will determine the need for genetic counseling and BRCA1 and for BRCA2 testing. BRCA-related cancers include these types: ? Breast. This occurs in males or females. ? Ovarian. ? Tubal. This may also be called fallopian tube cancer. ? Cancer of the abdominal or pelvic lining (peritoneal cancer). ? Prostate. ? Pancreatic.  Cervical, Uterine, and Ovarian Cancer Your health care provider may recommend that you be screened regularly for cancer of the pelvic organs. These include your ovaries, uterus, and vagina. This screening involves a pelvic exam, which includes checking for microscopic changes to the surface of your cervix (Pap test).  For women ages 21-65, health care providers may recommend a pelvic exam and a Pap test every three years. For women ages 24-65, they may recommend the Pap test and pelvic exam, combined with testing for human papilloma virus (HPV), every five years. Some types of HPV increase your  risk of cervical cancer. Testing for HPV may also be done on women of any age who have unclear Pap test results.  Other health care providers may not recommend any screening for nonpregnant women who are considered low risk for pelvic cancer and have no symptoms. Ask your health care provider if a screening pelvic exam is right for you.  If you have had past treatment for cervical cancer or a condition that could lead to cancer, you need Pap tests and screening for cancer for at least 20 years after your treatment. If Pap tests have been discontinued for you, your risk factors (such as having a new sexual partner) need to be reassessed to determine if you should start having screenings again. Some women have medical problems that increase the chance of getting cervical cancer. In these cases, your health care provider may recommend that you have screening and Pap tests more often.  If you have a family history of uterine cancer or ovarian cancer, talk with your health care provider about genetic screening.  If you have vaginal bleeding after reaching menopause, tell your health care provider.  There are currently no reliable tests available to screen for ovarian cancer.  Lung Cancer Lung cancer screening is recommended for adults 65-21 years old who are at high risk for lung cancer because of a history of smoking. A yearly low-dose CT  scan of the lungs is recommended if you:  Currently smoke.  Have a history of at least 30 pack-years of smoking and you currently smoke or have quit within the past 15 years. A pack-year is smoking an average of one pack of cigarettes per day for one year.  Yearly screening should:  Continue until it has been 15 years since you quit.  Stop if you develop a health problem that would prevent you from having lung cancer treatment.  Colorectal Cancer  This type of cancer can be detected and can often be prevented.  Routine colorectal cancer screening usually  begins at age 45 and continues through age 59.  If you have risk factors for colon cancer, your health care provider may recommend that you be screened at an earlier age.  If you have a family history of colorectal cancer, talk with your health care provider about genetic screening.  Your health care provider may also recommend using home test kits to check for hidden blood in your stool.  A small camera at the end of a tube can be used to examine your colon directly (sigmoidoscopy or colonoscopy). This is done to check for the earliest forms of colorectal cancer.  Direct examination of the colon should be repeated every 5-10 years until age 59. However, if early forms of precancerous polyps or small growths are found or if you have a family history or genetic risk for colorectal cancer, you may need to be screened more often.  Skin Cancer  Check your skin from head to toe regularly.  Monitor any moles. Be sure to tell your health care provider: ? About any new moles or changes in moles, especially if there is a change in a mole's shape or color. ? If you have a mole that is larger than the size of a pencil eraser.  If any of your family members has a history of skin cancer, especially at a young age, talk with your health care provider about genetic screening.  Always use sunscreen. Apply sunscreen liberally and repeatedly throughout the day.  Whenever you are outside, protect yourself by wearing long sleeves, pants, a wide-brimmed hat, and sunglasses.  What should I know about osteoporosis? Osteoporosis is a condition in which bone destruction happens more quickly than new bone creation. After menopause, you may be at an increased risk for osteoporosis. To help prevent osteoporosis or the bone fractures that can happen because of osteoporosis, the following is recommended:  If you are 51-48 years old, get at least 1,000 mg of calcium and at least 600 mg of vitamin D per day.  If you  are older than age 98 but younger than age 76, get at least 1,200 mg of calcium and at least 600 mg of vitamin D per day.  If you are older than age 22, get at least 1,200 mg of calcium and at least 800 mg of vitamin D per day.  Smoking and excessive alcohol intake increase the risk of osteoporosis. Eat foods that are rich in calcium and vitamin D, and do weight-bearing exercises several times each week as directed by your health care provider. What should I know about how menopause affects my mental health? Depression may occur at any age, but it is more common as you become older. Common symptoms of depression include:  Low or sad mood.  Changes in sleep patterns.  Changes in appetite or eating patterns.  Feeling an overall lack of motivation or enjoyment of activities  that you previously enjoyed.  Frequent crying spells.  Talk with your health care provider if you think that you are experiencing depression. What should I know about immunizations? It is important that you get and maintain your immunizations. These include:  Tetanus, diphtheria, and pertussis (Tdap) booster vaccine.  Influenza every year before the flu season begins.  Pneumonia vaccine.  Shingles vaccine.  Your health care provider may also recommend other immunizations. This information is not intended to replace advice given to you by your health care provider. Make sure you discuss any questions you have with your health care provider. Document Released: 11/06/2005 Document Revised: 04/03/2016 Document Reviewed: 06/18/2015 Elsevier Interactive Patient Education  2018 [employer].

## 2017-08-16 LAB — MULTIPLE MYELOMA PANEL, SERUM
Albumin SerPl Elph-Mcnc: 3.6 g/dL (ref 2.9–4.4)
Albumin/Glob SerPl: 1.3 (ref 0.7–1.7)
Alpha 1: 0.2 g/dL (ref 0.0–0.4)
Alpha2 Glob SerPl Elph-Mcnc: 0.7 g/dL (ref 0.4–1.0)
B-Globulin SerPl Elph-Mcnc: 0.9 g/dL (ref 0.7–1.3)
Gamma Glob SerPl Elph-Mcnc: 1 g/dL (ref 0.4–1.8)
Globulin, Total: 2.9 g/dL (ref 2.2–3.9)
IgA, Qn, Serum: 177 mg/dL (ref 64–422)
IgG, Qn, Serum: 1062 mg/dL (ref 700–1600)
IgM, Qn, Serum: 43 mg/dL (ref 26–217)
M Protein SerPl Elph-Mcnc: 0.2 g/dL — ABNORMAL HIGH
Total Protein: 6.5 g/dL (ref 6.0–8.5)

## 2017-08-17 DIAGNOSIS — Z049 Encounter for examination and observation for unspecified reason: Secondary | ICD-10-CM | POA: Diagnosis not present

## 2017-08-17 DIAGNOSIS — H04123 Dry eye syndrome of bilateral lacrimal glands: Secondary | ICD-10-CM | POA: Diagnosis not present

## 2017-08-17 DIAGNOSIS — H26493 Other secondary cataract, bilateral: Secondary | ICD-10-CM | POA: Diagnosis not present

## 2017-08-17 DIAGNOSIS — E119 Type 2 diabetes mellitus without complications: Secondary | ICD-10-CM | POA: Diagnosis not present

## 2017-08-17 DIAGNOSIS — Z09 Encounter for follow-up examination after completed treatment for conditions other than malignant neoplasm: Secondary | ICD-10-CM | POA: Diagnosis not present

## 2017-08-17 DIAGNOSIS — M3210 Systemic lupus erythematosus, organ or system involvement unspecified: Secondary | ICD-10-CM | POA: Diagnosis not present

## 2017-08-17 DIAGNOSIS — Z961 Presence of intraocular lens: Secondary | ICD-10-CM | POA: Diagnosis not present

## 2017-08-17 DIAGNOSIS — Z79899 Other long term (current) drug therapy: Secondary | ICD-10-CM | POA: Diagnosis not present

## 2017-08-18 ENCOUNTER — Ambulatory Visit: Payer: Commercial Managed Care - HMO | Admitting: Rheumatology

## 2017-08-20 NOTE — Progress Notes (Signed)
Office Visit Note  Patient: Hannah [name]             Date of Birth: October 05, 1945           MRN: [id number]             PCP: Glendale Chard, MD Referring: Glendale Chard, MD Visit Date: 09/02/2017 Occupation: @GUAROCC @    Subjective:  Right knee pain and stiffness    History of Present Illness: Hannah [name] is a 71 y.o. female with a history of seropositive rheumatoid arthritis, systemic lupus erythematosus, gout, and osteoporosis.  Patient states she has right knee pain and stiffness.  Denies any joint swelling.  He states she does not experience any locking or mechanical symptoms.  Patient states she participates in Silver sneakers, chair yoga, and walking exercises.  He also uses a knee brace occasionally.  She denies icing  at home.  She states she occasionally uses a wedge in bed to prop up her knee.  Patient states she she experiences occasional hand swelling. She continues to take Plaquenil 200 mg once daily.  Her most recent eye exam was 08/17/17, which was normal. She continues to take Allopurinol 100 mg daily, and she denies any recent gout flares.  She takes Fosamax 70 mg total by mouth once weekly.  She does not need any refills at this time.  She denies any recent lupus flares.  Denies rashes, mouth sores, or nose sores.  She has some generalized fatigue but no myalgias.  Overall, she feels like she is doing well.  She will getting an iron infusion next week and the following week due to a low ferritin level, she is being followed by Dr. Irene Limbo.      Activities of Daily Living:  Patient reports morning stiffness for 5 minutes.   Patient Reports nocturnal pain.  Difficulty dressing/grooming: Reports Difficulty climbing stairs: Reports Difficulty getting out of chair: Denies Difficulty using hands for taps, buttons, cutlery, and/or writing: Denies   Review of Systems  Constitutional: Positive for fatigue. Negative for weakness.  HENT: Negative for mouth dryness.     Eyes: Negative for dryness.  Respiratory: Negative.  Negative for cough, shortness of breath and difficulty breathing.   Cardiovascular: Negative.  Positive for hypertension. Negative for chest pain, palpitations, irregular heartbeat and swelling in legs/feet.  Gastrointestinal: Negative.  Negative for blood in stool, constipation and diarrhea.  Endocrine: Negative.   Genitourinary: Negative.  Negative for nocturia.  Musculoskeletal: Positive for arthralgias and joint pain. Negative for joint swelling, myalgias, muscle weakness, morning stiffness, muscle tenderness and myalgias.  Skin: Negative.  Negative for color change, rash, hair loss, ulcers and sensitivity to sunlight.  Neurological: Negative.  Negative for dizziness, numbness and headaches.  Hematological: Negative.  Negative for swollen glands.  Psychiatric/Behavioral: Positive for sleep disturbance. Negative for depressed mood. The patient is not nervous/anxious.     PMFS History:  Patient Active Problem List   Diagnosis Date Noted  . Iron deficiency anemia 08/12/2017  . Osteoporosis 10/02/2016  . Systemic lupus erythematosus ([place]) 10/02/2016  . Rheumatoid arthritis involving multiple sites with positive rheumatoid factor ([place]) 10/02/2016  . High risk medication use 10/02/2016  . History of chronic kidney disease 10/02/2016  . 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/2016  . Primary osteoarthritis of both knees 10/02/2016  . Vitamin D deficiency 10/02/2016  . History of diabetes mellitus 10/02/2016  . History of hypertension 10/02/2016  . History of asthma 10/02/2016  .  Absolute anemia   . MGUS (monoclonal gammopathy of unknown significance)   . Normocytic anemia 03/21/2015  . Essential hypertension 03/20/2015  . Abnormal CT of the chest 07/30/2012  . Asthma 06/19/2012    Past Medical History:  Diagnosis Date  . Arthritis   . Asthma   . Diabetes mellitus without complication ([place])   . Gout  12/17/2014   patient reported  . Hyperlipidemia   . Hypertension     Family History  Problem Relation Age of Onset  . Heart disease Father   . Diabetes Father   . Hypertension Mother    Past Surgical History:  Procedure Laterality Date  . CATARACT EXTRACTION Bilateral 2015  . DILATION AND CURETTAGE OF UTERUS     Social History   Social History Narrative  . Not on file     Objective: Vital Signs: BP (!) 162/69   Pulse 66   Resp 12   Ht 4\' 8"  (1.422 m)   Wt 170 lb (77.1 kg)   BMI 38.11 kg/m    Physical Exam  Constitutional: She is oriented to person, place, and time. She appears well-developed and well-nourished.  HENT:  Head: Normocephalic and atraumatic.  Eyes: Conjunctivae and EOM are normal.  Neck: Normal range of motion.  Cardiovascular: Normal rate, regular rhythm, normal heart sounds and intact distal pulses.  Pulmonary/Chest: Effort normal and breath sounds normal.  Abdominal: Soft. Bowel sounds are normal.  Lymphadenopathy:    She has no cervical adenopathy.  Neurological: She is alert and oriented to person, place, and time.  Skin: Skin is warm and dry. Capillary refill takes less than 2 seconds.  Psychiatric: She has a normal mood and affect. Her behavior is normal.  Nursing note and vitals reviewed.    Musculoskeletal Exam: C-spine, thoracic spine, lumbar spine good range of motion.  Shoulder joints elbow joints and wrist joints good range of motion.  MCPs PIPs and DIPs good range of motion with no synovitis.  Synovial thickening of second and third bilateral MCP joints.  Synovial thickening of PIP and DIP joints consistent with osteoarthritis.  Hip joints and ankle joints good range of motion.  Bilateral knee crepitus.  Limited right knee flexion. No effusion present. MTPs, PIPs and DIPs good range of motion with no synovitis.    CDAI Exam: CDAI Homunculus Exam:   Tenderness:  RLE: tibiofemoral  Joint Counts:  CDAI Tender Joint count: 1 CDAI  Swollen Joint count: 0  Global Assessments:  Patient Global Assessment: 4 Provider Global Assessment: 4  CDAI Calculated Score: 9    Investigation: No additional findings.PLQ eye exam: 08/12/2016 08/17/17 most recent PLQ eye exam CBC Latest Ref Rng & Units 08/12/2017 02/16/2017 10/05/2016  WBC 3.9 - 10.3 10e3/uL 4.1 4.0 4.9  Hemoglobin 11.6 - 15.9 g/dL 9.9(L) 10.2(L) 10.0(L)  Hematocrit 34.8 - 46.6 % 30.7(L) 31.4(L) 32.1(L)  Platelets 145 - 400 10e3/uL 114(L) 125(L) 226   CMP Latest Ref Rng & Units 08/12/2017 08/12/2017 02/16/2017  Glucose 70 - 140 mg/dl 85 - 87  BUN 7.0 - 26.0 mg/dL 20.8 - 22.2  Creatinine 0.6 - 1.1 mg/dL 1.4(H) - 1.2(H)  Sodium 136 - 145 mEq/L 142 - 141  Potassium 3.5 - 5.1 mEq/L 3.7 - 3.6  Chloride 98 - 110 mmol/L - - -  CO2 22 - 29 mEq/L 27 - 27  Calcium 8.4 - 10.4 mg/dL 10.3 - 9.3  Total Protein 6.4 - 8.3 g/dL 6.8 6.5 6.9  Total Bilirubin 0.20 - 1.20 mg/dL  0.45 - 0.60  Alkaline Phos 40 - 150 U/L 51 - 54  AST 5 - 34 U/L 31 - 25  ALT 0 - 55 U/L 38 - 27    Imaging: No results found.  Speciality Comments: PLQ eye exam: 08/17/2017 Normal. Dr. Clent Jacks. Follow up in 1 year.    Procedures:  Large Joint Inj: R knee on 09/02/2017 9:18 AM Indications: pain Details: 27 G 1.5 in needle, medial approach  Arthrogram: No  Medications: 1.5 mL lidocaine 1 %; 40 mg triamcinolone acetonide 40 MG/ML Aspirate: 0 mL Outcome: tolerated well, no immediate complications Procedure, treatment alternatives, risks and benefits explained, specific risks discussed. Immediately prior to procedure a time out was called to verify the correct patient, procedure, equipment, support staff and site/side marked as required.     Allergies: Shellfish allergy   Assessment / Plan:     Visit Diagnoses: Rheumatoid arthritis involving multiple sites with positive rheumatoid factor (HCC) - Positive RF, positive anti-CCP-patient has no synovitis on exam.  She is clinically doing well.   She continues to take Plaquenil.    Other systemic lupus erythematosus with other organ involvement (HCC) - Positive ANA, positive Smith, positive RNP-No recent flares.  Continues to take Plaquenil.    High risk medication use - Plaquenil 200 mg every other day.eye exam: 08/17/17.  CBC and CMP were performed on 08/12/17.  Patient is anemic and will be undergoing iron infusions the next 2 weeks.  Her creatinine is slightly elevated and will continue to be monitored.    Age-related osteoporosis without current pathological fracture: Patient takes Fosamax 70 mg weekly.  She has had no recent fractures.    Idiopathic chronic gout of multiple sites without tophus - allopurinol 100 mg by mouth daily. No recent flares.  Uric acid level will be checked with next labs.    History of chronic kidney disease - GFR 44 and creatinine 1.4 when last checked 08/12/17.    Primary osteoarthritis of both knees - Patient is experiencing pain and stiffness in her right knee.  Her last cortisone injection was in June 2018.  Patient was instructed to check blood glucose levels frequently today to check for hyperglycemia following cortisone injection.  She was instructed to call her PCP if her blood glucose levels rise significantly.   Plan: Large Joint Inj: R knee. A handout for knee exercises was provided.  She was encouraged to ice and elevate her knee if she experiences swelling.  She can continue to use the wedge in bed and her knee brace as needed .  Risks and benefits of cortisone injection were explained.    History of diabetes mellitus: instructed to monitor blood glucose levels today due to cortisone injection given in the office.  Call PCP if levels rise significantly.    Other medical conditions are listed as follows:   History of asthma  History of hypertension  Vitamin D deficiency - She's been taking vitamin D supplements   MGUS (monoclonal gammopathy of unknown significance) - Followed up by Dr.  Velvet Bathe.  History of anemia - She started taking iron supplements per recommendation of her hematologist. She is receiving iron infusions the next 2 weeks.     Orders: Orders Placed This Encounter  Procedures  . Large Joint Inj: R knee  . COMPLETE METABOLIC PANEL WITH GFR  . CBC with Differential/Platelet  . Urinalysis, Routine w reflex microscopic  . Anti-DNA antibody, double-stranded  . C3 and C4  . Sedimentation rate  .  Uric acid   No orders of the defined types were placed in this encounter.     Follow-Up Instructions: Return in about 5 months (around 01/31/2018) for Rheumatoid arthritis. Bo Merino, MD Note - This record has been created using Editor, commissioning.  Chart creation errors have been sought, but may not always  have been located. Such creation errors do not reflect on  the standard of medical care.

## 2017-08-30 ENCOUNTER — Telehealth: Payer: Self-pay

## 2017-08-30 NOTE — Telephone Encounter (Signed)
Patient has had a family emergency and will not be in town for iron infusion on 12/5. Scheduling message sent to change appt, but keep iron infusions one week apart. Pt to f/u tomorrow afternoon if she has not heard anything. This RN to f/u tomorrow as well.

## 2017-09-01 ENCOUNTER — Ambulatory Visit: Payer: Medicare HMO

## 2017-09-02 ENCOUNTER — Encounter: Payer: Self-pay | Admitting: Rheumatology

## 2017-09-02 ENCOUNTER — Ambulatory Visit: Payer: Commercial Managed Care - HMO | Admitting: Rheumatology

## 2017-09-02 VITALS — BP 162/69 | HR 66 | Resp 12 | Ht <= 58 in | Wt 170.0 lb

## 2017-09-02 DIAGNOSIS — Z8709 Personal history of other diseases of the respiratory system: Secondary | ICD-10-CM

## 2017-09-02 DIAGNOSIS — M810 Age-related osteoporosis without current pathological fracture: Secondary | ICD-10-CM

## 2017-09-02 DIAGNOSIS — Z79899 Other long term (current) drug therapy: Secondary | ICD-10-CM

## 2017-09-02 DIAGNOSIS — Z87448 Personal history of other diseases of urinary system: Secondary | ICD-10-CM

## 2017-09-02 DIAGNOSIS — G8929 Other chronic pain: Secondary | ICD-10-CM

## 2017-09-02 DIAGNOSIS — M25561 Pain in right knee: Secondary | ICD-10-CM

## 2017-09-02 DIAGNOSIS — Z8639 Personal history of other endocrine, nutritional and metabolic disease: Secondary | ICD-10-CM | POA: Diagnosis not present

## 2017-09-02 DIAGNOSIS — M3219 Other organ or system involvement in systemic lupus erythematosus: Secondary | ICD-10-CM

## 2017-09-02 DIAGNOSIS — M1A09X0 Idiopathic chronic gout, multiple sites, without tophus (tophi): Secondary | ICD-10-CM

## 2017-09-02 DIAGNOSIS — M0579 Rheumatoid arthritis with rheumatoid factor of multiple sites without organ or systems involvement: Secondary | ICD-10-CM

## 2017-09-02 DIAGNOSIS — M170 Bilateral primary osteoarthritis of knee: Secondary | ICD-10-CM | POA: Diagnosis not present

## 2017-09-02 DIAGNOSIS — Z8679 Personal history of other diseases of the circulatory system: Secondary | ICD-10-CM

## 2017-09-02 DIAGNOSIS — D472 Monoclonal gammopathy: Secondary | ICD-10-CM | POA: Diagnosis not present

## 2017-09-02 DIAGNOSIS — Z862 Personal history of diseases of the blood and blood-forming organs and certain disorders involving the immune mechanism: Secondary | ICD-10-CM

## 2017-09-02 DIAGNOSIS — E559 Vitamin D deficiency, unspecified: Secondary | ICD-10-CM

## 2017-09-02 MED ORDER — LIDOCAINE HCL 1 % IJ SOLN
1.5000 mL | INTRAMUSCULAR | Status: AC | PRN
  Administered 2017-09-02: 1.5 mL

## 2017-09-02 MED ORDER — TRIAMCINOLONE ACETONIDE 40 MG/ML IJ SUSP
40.0000 mg | INTRAMUSCULAR | Status: AC | PRN
  Administered 2017-09-02: 40 mg via INTRA_ARTICULAR

## 2017-09-02 NOTE — Patient Instructions (Signed)

## 2017-09-02 NOTE — Progress Notes (Deleted)
   Procedure Note  Patient: Hannah [name]             Date of Birth: March 22, 1946           MRN: [id number]             Visit Date: 09/02/2017  Procedures: Visit Diagnoses: Rheumatoid arthritis involving multiple sites with positive rheumatoid factor ([place]) - Positive RF, positive anti-CCP.  Other systemic lupus erythematosus with other organ involvement (HCC) - Positive ANA, positive Smith, positive RNP  High risk medication use - Plaquenil 200 mg every other day.eye exam: 08/12/2016  Age-related osteoporosis without current pathological fracture  Idiopathic chronic gout of multiple sites without tophus - allopurinol 100 mg by mouth daily  History of chronic kidney disease - GFR in 41s  Primary osteoarthritis of both knees  History of diabetes mellitus  History of asthma  History of hypertension  Vitamin D deficiency - She's been taking vitamin D supplements   MGUS (monoclonal gammopathy of unknown significance) - Followed up by Dr. Velvet Bathe.  History of anemia - She started taking iron supplements per recommendation of her hematologist.  Large Joint Inj: R knee on 09/02/2017 9:14 AM Indications: pain Details: 27 G 1.5 in needle, anterior approach Medications: 1.5 mL lidocaine 1 %; 40 mg triamcinolone acetonide 40 MG/ML

## 2017-09-08 ENCOUNTER — Ambulatory Visit (HOSPITAL_BASED_OUTPATIENT_CLINIC_OR_DEPARTMENT_OTHER): Payer: Medicare HMO

## 2017-09-08 VITALS — BP 167/70 | HR 57 | Temp 98.9°F | Resp 16

## 2017-09-08 DIAGNOSIS — D508 Other iron deficiency anemias: Secondary | ICD-10-CM | POA: Diagnosis not present

## 2017-09-08 MED ORDER — SODIUM CHLORIDE 0.9 % IV SOLN
750.0000 mg | Freq: Once | INTRAVENOUS | Status: AC
  Administered 2017-09-08: 750 mg via INTRAVENOUS
  Filled 2017-09-08: qty 15

## 2017-09-08 NOTE — Patient Instructions (Signed)
Ferric carboxymaltose injection What is this medicine? FERRIC CARBOXYMALTOSE (ferr-ik car-box-ee-mol-toes) is an iron complex. Iron is used to make healthy red blood cells, which carry oxygen and nutrients throughout the body. This medicine is used to treat anemia in people with chronic kidney disease or people who cannot take iron by mouth. This medicine may be used for other purposes; ask your health care provider or pharmacist if you have questions. COMMON BRAND NAME(S): Injectafer What should I tell my health care provider before I take this medicine? They need to know if you have any of these conditions: -anemia not caused by low iron levels -high levels of iron in the blood -liver disease -an unusual or allergic reaction to iron, other medicines, foods, dyes, or preservatives -pregnant or trying to get pregnant -breast-feeding How should I use this medicine? This medicine is for infusion into a vein. It is given by a health care professional in a hospital or clinic setting. Talk to your pediatrician regarding the use of this medicine in children. Special care may be needed. Overdosage: If you think you have taken too much of this medicine contact a poison control center or emergency room at once. NOTE: This medicine is only for you. Do not share this medicine with others. What if I miss a dose? It is important not to miss your dose. Call your doctor or health care professional if you are unable to keep an appointment. What may interact with this medicine? Do not take this medicine with any of the following medications: -deferoxamine -dimercaprol -other iron products This medicine may also interact with the following medications: -chloramphenicol -deferasirox This list may not describe all possible interactions. Give your health care provider a list of all the medicines, herbs, non-prescription drugs, or dietary supplements you use. Also tell them if you smoke, drink alcohol, or use  illegal drugs. Some items may interact with your medicine. What should I watch for while using this medicine? Visit your doctor or health care professional regularly. Tell your doctor if your symptoms do not start to get better or if they get worse. You may need blood work done while you are taking this medicine. You may need to follow a special diet. Talk to your doctor. Foods that contain iron include: whole grains/cereals, dried fruits, beans, or peas, leafy green vegetables, and organ meats (liver, kidney). What side effects may I notice from receiving this medicine? Side effects that you should report to your doctor or health care professional as soon as possible: -allergic reactions like skin rash, itching or hives, swelling of the face, lips, or tongue -breathing problems -changes in blood pressure -feeling faint or lightheaded, falls -flushing, sweating, or hot feelings Side effects that usually do not require medical attention (report to your doctor or health care professional if they continue or are bothersome): -changes in taste -constipation -dizziness -headache -nausea -pain, redness, or irritation at site where injected -vomiting This list may not describe all possible side effects. Call your doctor for medical advice about side effects. You may report side effects to FDA at 1-800-FDA-1088. Where should I keep my medicine? This drug is given in a hospital or clinic and will not be stored at home. NOTE: This sheet is a summary. It may not cover all possible information. If you have questions about this medicine, talk to your doctor, pharmacist, or health care provider.  2018 Elsevier/Gold Standard (2015-10-17 11:20:47)  

## 2017-09-09 DIAGNOSIS — I129 Hypertensive chronic kidney disease with stage 1 through stage 4 chronic kidney disease, or unspecified chronic kidney disease: Secondary | ICD-10-CM | POA: Diagnosis not present

## 2017-09-09 DIAGNOSIS — M1711 Unilateral primary osteoarthritis, right knee: Secondary | ICD-10-CM | POA: Diagnosis not present

## 2017-09-09 DIAGNOSIS — N183 Chronic kidney disease, stage 3 (moderate): Secondary | ICD-10-CM | POA: Diagnosis not present

## 2017-09-09 DIAGNOSIS — R233 Spontaneous ecchymoses: Secondary | ICD-10-CM | POA: Diagnosis not present

## 2017-09-13 ENCOUNTER — Telehealth (INDEPENDENT_AMBULATORY_CARE_PROVIDER_SITE_OTHER): Payer: Self-pay

## 2017-09-13 NOTE — Telephone Encounter (Signed)
Patient called stating that her right knee is swollen and is painful to walk.  Patient received cort. Injection on 09/02/17.  Currently walking with a cane.   Would like to know if she could be seen?  Cb# is [phone].  Please advise.  Thank you.

## 2017-09-14 ENCOUNTER — Ambulatory Visit: Payer: Medicare HMO | Admitting: Rheumatology

## 2017-09-14 ENCOUNTER — Encounter: Payer: Self-pay | Admitting: Rheumatology

## 2017-09-14 ENCOUNTER — Ambulatory Visit (INDEPENDENT_AMBULATORY_CARE_PROVIDER_SITE_OTHER): Payer: Medicare HMO

## 2017-09-14 VITALS — BP 159/85 | HR 79 | Resp 12 | Ht <= 58 in | Wt 167.0 lb

## 2017-09-14 DIAGNOSIS — G8929 Other chronic pain: Secondary | ICD-10-CM

## 2017-09-14 DIAGNOSIS — M0579 Rheumatoid arthritis with rheumatoid factor of multiple sites without organ or systems involvement: Secondary | ICD-10-CM | POA: Diagnosis not present

## 2017-09-14 DIAGNOSIS — Z8679 Personal history of other diseases of the circulatory system: Secondary | ICD-10-CM

## 2017-09-14 DIAGNOSIS — M810 Age-related osteoporosis without current pathological fracture: Secondary | ICD-10-CM | POA: Diagnosis not present

## 2017-09-14 DIAGNOSIS — Z8709 Personal history of other diseases of the respiratory system: Secondary | ICD-10-CM | POA: Diagnosis not present

## 2017-09-14 DIAGNOSIS — D472 Monoclonal gammopathy: Secondary | ICD-10-CM

## 2017-09-14 DIAGNOSIS — M25561 Pain in right knee: Secondary | ICD-10-CM

## 2017-09-14 DIAGNOSIS — Z79899 Other long term (current) drug therapy: Secondary | ICD-10-CM

## 2017-09-14 DIAGNOSIS — M170 Bilateral primary osteoarthritis of knee: Secondary | ICD-10-CM

## 2017-09-14 DIAGNOSIS — Z87448 Personal history of other diseases of urinary system: Secondary | ICD-10-CM | POA: Diagnosis not present

## 2017-09-14 DIAGNOSIS — M3219 Other organ or system involvement in systemic lupus erythematosus: Secondary | ICD-10-CM

## 2017-09-14 DIAGNOSIS — Z862 Personal history of diseases of the blood and blood-forming organs and certain disorders involving the immune mechanism: Secondary | ICD-10-CM

## 2017-09-14 DIAGNOSIS — Z8639 Personal history of other endocrine, nutritional and metabolic disease: Secondary | ICD-10-CM

## 2017-09-14 DIAGNOSIS — M1A09X0 Idiopathic chronic gout, multiple sites, without tophus (tophi): Secondary | ICD-10-CM | POA: Diagnosis not present

## 2017-09-14 NOTE — Telephone Encounter (Signed)
Patient has been offered an appointment for Friday at 9:00 am. Patient is due to go out of town on Thursday. Patient has been scheduled as patient states she is going to rearrange her travel plans. Patient advised if we have a cancellation we will try to get her in sooner.

## 2017-09-14 NOTE — Progress Notes (Signed)
Office Visit Note  Patient: Hannah [name]             Date of Birth: 1945/12/04           MRN: [id number]             PCP: Glendale Chard, MD Referring: Glendale Chard, MD Visit Date: 09/14/2017 Occupation: @GUAROCC @    Subjective:  Pain and swelling of the Right Knee (Had cortisone injection on 09/02/17 without relief)   History of Present Illness: Hannah [name] is a 71 y.o. female with history of rheumatoid arthritis, lupus and gout. She had cortisone injection to her right knee joint in 09/02/2017 for ongoing pain and discomfort. She states she has had no relief from the cortisone injection. She continues to have pain and swelling in her right knee and discomfort walking. None of the other joints are painful.  Activities of Daily Living:  Patient reports morning stiffness for 15 minutes.   Patient Reports nocturnal pain.  Difficulty dressing/grooming: Reports Difficulty climbing stairs: Reports Difficulty getting out of chair: Reports Difficulty using hands for taps, buttons, cutlery, and/or writing: Denies   Review of Systems  Constitutional: Positive for fatigue. Negative for night sweats, weight gain, weight loss and weakness.  HENT: Negative for mouth sores, trouble swallowing, trouble swallowing, mouth dryness and nose dryness.   Eyes: Negative for pain, redness, visual disturbance and dryness.  Respiratory: Negative for cough, shortness of breath and difficulty breathing.   Cardiovascular: Negative for chest pain, palpitations, hypertension, irregular heartbeat and swelling in legs/feet.  Gastrointestinal: Negative for blood in stool, constipation and diarrhea.  Endocrine: Negative for increased urination.  Genitourinary: Negative for vaginal dryness.  Musculoskeletal: Positive for arthralgias, joint pain, joint swelling and morning stiffness. Negative for myalgias, muscle weakness, muscle tenderness and myalgias.  Skin: Negative for color change, rash, hair  loss, skin tightness, ulcers and sensitivity to sunlight.  Allergic/Immunologic: Negative for susceptible to infections.  Neurological: Negative for dizziness, memory loss and night sweats.  Hematological: Negative for swollen glands.  Psychiatric/Behavioral: Positive for sleep disturbance. Negative for depressed mood. The patient is not nervous/anxious.     PMFS History:  Patient Active Problem List   Diagnosis Date Noted  . Iron deficiency anemia 08/12/2017  . Osteoporosis 10/02/2016  . Systemic lupus erythematosus ([place]) 10/02/2016  . Rheumatoid arthritis involving multiple sites with positive rheumatoid factor ([place]) 10/02/2016  . High risk medication use 10/02/2016  . History of chronic kidney disease 10/02/2016  . 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/2016  . Primary osteoarthritis of both knees 10/02/2016  . Vitamin D deficiency 10/02/2016  . History of diabetes mellitus 10/02/2016  . History of hypertension 10/02/2016  . History of asthma 10/02/2016  . Absolute anemia   . MGUS (monoclonal gammopathy of unknown significance)   . Normocytic anemia 03/21/2015  . Essential hypertension 03/20/2015  . Abnormal CT of the chest 07/30/2012  . Asthma 06/19/2012    Past Medical History:  Diagnosis Date  . Arthritis   . Asthma   . Diabetes mellitus without complication ([place])   . Gout 12/17/2014   patient reported  . Hyperlipidemia   . Hypertension     Family History  Problem Relation Age of Onset  . Heart disease Father   . Diabetes Father   . Hypertension Mother    Past Surgical History:  Procedure Laterality Date  . CATARACT EXTRACTION Bilateral 2015  . DILATION AND CURETTAGE OF UTERUS     Social  History   Social History Narrative  . Not on file     Objective: Vital Signs: BP (!) 159/85 (BP Location: Left Arm, Patient Position: Sitting)   Pulse 79   Resp 12   Ht 4\' 9"  (1.448 m)   Wt 167 lb (75.8 kg)   BMI 36.14 kg/m    Physical  Exam  Constitutional: She is oriented to person, place, and time. She appears well-developed and well-nourished.  HENT:  Head: Normocephalic and atraumatic.  Eyes: Conjunctivae and EOM are normal.  Neck: Normal range of motion.  Cardiovascular: Normal rate, regular rhythm, normal heart sounds and intact distal pulses.  Pulmonary/Chest: Effort normal and breath sounds normal.  Abdominal: Soft. Bowel sounds are normal.  Lymphadenopathy:    She has no cervical adenopathy.  Neurological: She is alert and oriented to person, place, and time.  Skin: Skin is warm and dry. Capillary refill takes less than 2 seconds.  Psychiatric: She has a normal mood and affect. Her behavior is normal.  Nursing note and vitals reviewed.    Musculoskeletal Exam: C-spine and thoracic lumbar spine which range of motion. Shoulder joints elbow joints are good range of motion. She is some synovial thickening over her MCP joints with ulnar deviation without any synovitis. Hip joints had some limitation with range of motion. She had warmth swelling and tenderness on palpation of her right knee joint. Left knee joint was good range of motion without discomfort.  CDAI Exam: CDAI Homunculus Exam:   Tenderness:  RLE: tibiofemoral  Swelling:  RLE: tibiofemoral  Joint Counts:  CDAI Tender Joint count: 1 CDAI Swollen Joint count: 1  Global Assessments:  Patient Global Assessment: 6 Provider Global Assessment: 5  CDAI Calculated Score: 13    Investigation: No additional findings.PLQ eye exam: 08/17/2017 CBC Latest Ref Rng & Units 08/12/2017 02/16/2017 10/05/2016  WBC 3.9 - 10.3 10e3/uL 4.1 4.0 4.9  Hemoglobin 11.6 - 15.9 g/dL 9.9(L) 10.2(L) 10.0(L)  Hematocrit 34.8 - 46.6 % 30.7(L) 31.4(L) 32.1(L)  Platelets 145 - 400 10e3/uL 114(L) 125(L) 226   CMP Latest Ref Rng & Units 08/12/2017 08/12/2017 02/16/2017  Glucose 70 - 140 mg/dl 85 - 87  BUN 7.0 - 26.0 mg/dL 20.8 - 22.2  Creatinine 0.6 - 1.1 mg/dL 1.4(H) -  1.2(H)  Sodium 136 - 145 mEq/L 142 - 141  Potassium 3.5 - 5.1 mEq/L 3.7 - 3.6  Chloride 98 - 110 mmol/L - - -  CO2 22 - 29 mEq/L 27 - 27  Calcium 8.4 - 10.4 mg/dL 10.3 - 9.3  Total Protein 6.4 - 8.3 g/dL 6.8 6.5 6.9  Total Bilirubin 0.20 - 1.20 mg/dL 0.45 - 0.60  Alkaline Phos 40 - 150 U/L 51 - 54  AST 5 - 34 U/L 31 - 25  ALT 0 - 55 U/L 38 - 27    Imaging: Xr Knee 3 View Right  Result Date: 09/14/2017 Moderate to severe medial compartment narrowing of the knee joint and severe patellofemoral narrowing. Impression: Moderate to severe osteoarthritis and severe chondromalacia patella.   Speciality Comments: PLQ eye exam: 08/17/2017 Normal. Dr. Clent Jacks. Follow up in 1 year.    Procedures:  No procedures performed Allergies: Shellfish allergy   Assessment / Plan:     Visit Diagnoses: Rheumatoid arthritis involving multiple sites with positive rheumatoid factor (HCC) - Positive RF, positive anti-CCP. She's been having pain in her left knee joint discomfort none of the other joints have been swollen. She is on very low-dose Plaquenil which she's  been tolerating well for a while. She's having increased pain and swelling in her knee joint I will obtain following labs today. I will contact her once the lab results are available.  Other systemic lupus erythematosus with other organ involvement (HCC) - Positive ANA, positive Smith, positive RNP. Doing well  High risk medication use - PLQ 200 mg po qod. Her labs have been stable Labs are due in February.  Idiopathic chronic gout of multiple sites without tophus - allopurinol 100 mg po qd  Chronic pain of right knee -she is warmth and swelling in her right knee. Plan: XR KNEE 3 VIEW RIGHT. The knee joint x-ray showed moderate to severe osteoarthritis and severe chondromalacia patella. I offered MRI of the knee joint to evaluate further. At this time patient would like to hold off. I've advised her to contact me in case her symptoms  persist.  Primary osteoarthritis of both knees: Chronic pain  Age-related osteoporosis without current pathological fracture - Patient takes Fosamax 70 mg weekly. T score -3.3, BMD 0.488 07/27/2016 which is decreased by -10% from 07/09/2014. I discussed the option of switching to Prolia. Side effects were reviewed. She will let us know if she wants to switch to Prolia.  Other medical problems are listed as follows:  History of diabetes mellitus  History of hypertension  History of asthma  History of chronic kidney disease - GFR in 57s  History of vitamin D deficiency  History of anemia - She started taking iron supplements per recommendation of her hematologist  MGUS (monoclonal gammopathy of unknown significance) - Followed up by Dr. Velvet Bathe.    Orders: Orders Placed This Encounter  Procedures  . XR KNEE 3 VIEW RIGHT  . Cyclic citrul peptide antibody, IgG   No orders of the defined types were placed in this encounter.   Face-to-face time spent with patient was 30 minutes. Greater  than 50% of time was spent in counseling and coordination of care.  Follow-Up Instructions: Return for Rheumatoid arthritis, SLE ,OA, Gout,OP.   Bo Merino, MD  Note - This record has been created using Editor, commissioning.  Chart creation errors have been sought, but may not always  have been located. Such creation errors do not reflect on  the standard of medical care.

## 2017-09-15 ENCOUNTER — Ambulatory Visit: Payer: Medicare HMO

## 2017-09-15 LAB — URINALYSIS, ROUTINE W REFLEX MICROSCOPIC
Bilirubin Urine: NEGATIVE
Glucose, UA: NEGATIVE
Hgb urine dipstick: NEGATIVE
Hyaline Cast: NONE SEEN /LPF
Ketones, ur: NEGATIVE
Leukocytes, UA: NEGATIVE
Nitrite: NEGATIVE
RBC / HPF: NONE SEEN /HPF (ref 0–2)
Specific Gravity, Urine: 1.019 (ref 1.001–1.03)
pH: <=5 (ref 5.0–8.0)

## 2017-09-15 LAB — ANTI-DNA ANTIBODY, DOUBLE-STRANDED: ds DNA Ab: <1 IU/mL

## 2017-09-15 LAB — C3 AND C4
C3 Complement: 121 mg/dL (ref 83–193)
C4 Complement: 38 mg/dL (ref 15–57)

## 2017-09-15 LAB — CYCLIC CITRUL PEPTIDE ANTIBODY, IGG: Cyclic Citrullin Peptide Ab: 36 UNITS — ABNORMAL HIGH

## 2017-09-15 LAB — SEDIMENTATION RATE: Sed Rate: 17 mm/h (ref 0–30)

## 2017-09-15 LAB — URIC ACID: Uric Acid, Serum: 5 mg/dL (ref 2.5–7.0)

## 2017-09-15 NOTE — Progress Notes (Signed)
Labs are stable and consistent with rheumatoid arthritis.

## 2017-09-16 ENCOUNTER — Telehealth: Payer: Self-pay

## 2017-09-16 MED ORDER — DENOSUMAB 60 MG/ML ~~LOC~~ SOLN: 60.0000 mg | SUBCUTANEOUS | 0 refills | Status: DC

## 2017-09-16 MED FILL — PROLIA 60 MG/ML SOLN: 60 | 180 days supply | Qty: 1 | Fill #0

## 2017-09-16 NOTE — Addendum Note (Signed)
Addended by: Carole Binning on: 09/16/2017 10:44 AM   Modules accepted: Orders

## 2017-09-16 NOTE — Telephone Encounter (Signed)
Prescription sent to the pharmacy and has been scheduled for 09/22/17 @ 2:00 pm

## 2017-09-16 NOTE — Telephone Encounter (Signed)
Patient would like to switch to Prolia Injection.  CB# is [phone]. Patient stated that she is going out of town and if you try to call and no answer, please leave a VM.

## 2017-09-16 NOTE — Telephone Encounter (Signed)
Returned call. Patient states that she would like to start Prolia and have her Rx sent to the [place] at [facility]. She is going out of town today and will return on Christmas day. Please send rx to pharmacy and schedule appointment.   Thank you!   Marcea Rojek, [place], CPhT 10:34 AM

## 2017-09-17 ENCOUNTER — Telehealth: Payer: Self-pay

## 2017-09-17 ENCOUNTER — Ambulatory Visit: Payer: Self-pay | Admitting: Rheumatology

## 2017-09-17 NOTE — Telephone Encounter (Signed)
Coordinated delivery of Prolia from [facility]. Medication stored in refrigerator. Patient scheduled for nursing visit on 12/26.   Adyan Palau, [place], CPhT 4:12 PM

## 2017-09-18 ENCOUNTER — Ambulatory Visit (HOSPITAL_BASED_OUTPATIENT_CLINIC_OR_DEPARTMENT_OTHER): Payer: Medicare HMO

## 2017-09-18 VITALS — BP 143/67 | HR 67 | Temp 98.9°F | Resp 18

## 2017-09-18 DIAGNOSIS — D508 Other iron deficiency anemias: Secondary | ICD-10-CM | POA: Diagnosis not present

## 2017-09-18 MED ORDER — SODIUM CHLORIDE 0.9 % IV SOLN
Freq: Once | INTRAVENOUS | Status: AC
  Administered 2017-09-18: 10:00:00 via INTRAVENOUS

## 2017-09-18 MED ORDER — SODIUM CHLORIDE 0.9 % IV SOLN
750.0000 mg | Freq: Once | INTRAVENOUS | Status: AC
  Administered 2017-09-18: 750 mg via INTRAVENOUS
  Filled 2017-09-18: qty 15

## 2017-09-18 NOTE — Patient Instructions (Signed)
Ferric carboxymaltose injection What is this medicine? FERRIC CARBOXYMALTOSE (ferr-ik car-box-ee-mol-toes) is an iron complex. Iron is used to make healthy red blood cells, which carry oxygen and nutrients throughout the body. This medicine is used to treat anemia in people with chronic kidney disease or people who cannot take iron by mouth. This medicine may be used for other purposes; ask your health care provider or pharmacist if you have questions. COMMON BRAND NAME(S): Injectafer What should I tell my health care provider before I take this medicine? They need to know if you have any of these conditions: -anemia not caused by low iron levels -high levels of iron in the blood -liver disease -an unusual or allergic reaction to iron, other medicines, foods, dyes, or preservatives -pregnant or trying to get pregnant -breast-feeding How should I use this medicine? This medicine is for infusion into a vein. It is given by a health care professional in a hospital or clinic setting. Talk to your pediatrician regarding the use of this medicine in children. Special care may be needed. Overdosage: If you think you have taken too much of this medicine contact a poison control center or emergency room at once. NOTE: This medicine is only for you. Do not share this medicine with others. What if I miss a dose? It is important not to miss your dose. Call your doctor or health care professional if you are unable to keep an appointment. What may interact with this medicine? Do not take this medicine with any of the following medications: -deferoxamine -dimercaprol -other iron products This medicine may also interact with the following medications: -chloramphenicol -deferasirox This list may not describe all possible interactions. Give your health care provider a list of all the medicines, herbs, non-prescription drugs, or dietary supplements you use. Also tell them if you smoke, drink alcohol, or use  illegal drugs. Some items may interact with your medicine. What should I watch for while using this medicine? Visit your doctor or health care professional regularly. Tell your doctor if your symptoms do not start to get better or if they get worse. You may need blood work done while you are taking this medicine. You may need to follow a special diet. Talk to your doctor. Foods that contain iron include: whole grains/cereals, dried fruits, beans, or peas, leafy green vegetables, and organ meats (liver, kidney). What side effects may I notice from receiving this medicine? Side effects that you should report to your doctor or health care professional as soon as possible: -allergic reactions like skin rash, itching or hives, swelling of the face, lips, or tongue -breathing problems -changes in blood pressure -feeling faint or lightheaded, falls -flushing, sweating, or hot feelings Side effects that usually do not require medical attention (report to your doctor or health care professional if they continue or are bothersome): -changes in taste -constipation -dizziness -headache -nausea -pain, redness, or irritation at site where injected -vomiting This list may not describe all possible side effects. Call your doctor for medical advice about side effects. You may report side effects to FDA at 1-800-FDA-1088. Where should I keep my medicine? This drug is given in a hospital or clinic and will not be stored at home. NOTE: This sheet is a summary. It may not cover all possible information. If you have questions about this medicine, talk to your doctor, pharmacist, or health care provider.  2018 Elsevier/Gold Standard (2015-10-17 11:20:47)  

## 2017-09-22 ENCOUNTER — Ambulatory Visit (INDEPENDENT_AMBULATORY_CARE_PROVIDER_SITE_OTHER): Payer: Medicare HMO | Admitting: *Deleted

## 2017-09-22 VITALS — BP 137/69 | HR 67

## 2017-09-22 DIAGNOSIS — M810 Age-related osteoporosis without current pathological fracture: Secondary | ICD-10-CM | POA: Diagnosis not present

## 2017-09-22 MED ORDER — DENOSUMAB 60 MG/ML ~~LOC~~ SOLN
60.0000 mg | Freq: Once | SUBCUTANEOUS | Status: AC
  Administered 2017-09-22: 60 mg via SUBCUTANEOUS

## 2017-09-22 NOTE — Patient Instructions (Signed)
Standing Labs We placed an order today for your standing lab work.    Please come back and get your standing labs in 10 days for CMP  We have open lab Monday through Friday from 8:30-11:30 AM and 1:30-4 PM at the office of Dr. Bo Merino.   The office is located at [address] [place], [place], [place] [address] No appointment is necessary.   Labs are drawn by [facility].  You may receive a bill from [facility] for your lab work. If you have any questions regarding directions or hours of operation,  please call [phone].

## 2017-09-22 NOTE — Progress Notes (Signed)
Patient in office for a Prolia injection. Patient provided medication. Patient tolerated injection well. Patient was monitored in office for 30 minutes after administration for adverse reactions. No adverse reactions noted.  Patient advised to return to office in 10 day for labs. Patient verbalized understanding.   Administrations This Visit    denosumab (PROLIA) injection 60 mg    Admin Date 09/22/2017 Action Given Dose 60 mg Route Subcutaneous Administered By Carole Binning, LPN

## 2017-09-30 ENCOUNTER — Other Ambulatory Visit: Payer: Self-pay | Admitting: Physician Assistant

## 2017-09-30 DIAGNOSIS — L821 Other seborrheic keratosis: Secondary | ICD-10-CM | POA: Diagnosis not present

## 2017-09-30 DIAGNOSIS — L820 Inflamed seborrheic keratosis: Secondary | ICD-10-CM | POA: Diagnosis not present

## 2017-09-30 DIAGNOSIS — D492 Neoplasm of unspecified behavior of bone, soft tissue, and skin: Secondary | ICD-10-CM | POA: Diagnosis not present

## 2017-09-30 DIAGNOSIS — D229 Melanocytic nevi, unspecified: Secondary | ICD-10-CM | POA: Diagnosis not present

## 2017-10-05 DIAGNOSIS — E039 Hypothyroidism, unspecified: Secondary | ICD-10-CM | POA: Diagnosis not present

## 2017-10-05 DIAGNOSIS — E1122 Type 2 diabetes mellitus with diabetic chronic kidney disease: Secondary | ICD-10-CM | POA: Diagnosis not present

## 2017-10-05 DIAGNOSIS — I129 Hypertensive chronic kidney disease with stage 1 through stage 4 chronic kidney disease, or unspecified chronic kidney disease: Secondary | ICD-10-CM | POA: Diagnosis not present

## 2017-10-05 DIAGNOSIS — N183 Chronic kidney disease, stage 3 (moderate): Secondary | ICD-10-CM | POA: Diagnosis not present

## 2017-10-06 ENCOUNTER — Telehealth: Payer: Self-pay | Admitting: Rheumatology

## 2017-10-06 NOTE — Telephone Encounter (Signed)
Dr. Baird Cancer left a voicemail to have a return call regarding patient.  CB [phone]

## 2017-10-07 NOTE — Telephone Encounter (Signed)
I have already spoken with Dr. Baird Cancer regarding this patient on 10/05/2017

## 2017-10-13 ENCOUNTER — Other Ambulatory Visit: Payer: Self-pay | Admitting: Rheumatology

## 2017-10-13 DIAGNOSIS — M069 Rheumatoid arthritis, unspecified: Secondary | ICD-10-CM

## 2017-10-13 NOTE — Telephone Encounter (Signed)
ok 

## 2017-10-13 NOTE — Telephone Encounter (Signed)
Last Visit: 09/14/17 Next Visit: 02/03/17 Labs: 08/12/17 Creat 1.4 Hgb 9.9  Previous Creat 1.2 Hgb 10.2  PLQ Eye Exam: 08/17/17 WNL  Okay to refill PLQ?

## 2017-12-08 NOTE — Progress Notes (Signed)
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 The patient's last visit with [place] was on 08/12/17. The pt reports that she is doing well overall.   The pt notes that she is able to do everything she's always been able to. She notes however that her knees are painful and will be following up with Dr. Estanislado Pandy in the first of May. She is taking Plaquenil and Prolia. She denies any other medication changes.   Lab results today (12/09/17) of CBC, CMP, and Reticulocytes is as follows: all values are WNL except for RBC at 3.25, Hgb at 10.5, HCT at 32.7, Platelets at 124k, Lymphs Abs at 0.8k. Ferritin 12/09/17 elevated at 304.   On review of systems, pt reports losing weight intentionally, pain in her right knee, and denies leg swelling, abdominal pain, and any other symptoms.  REVIEW OF SYSTEMS:   .10 Point review of Systems was done is negative except as noted above.  . Past Medical History:  Diagnosis Date  . Arthritis   . Asthma   . Diabetes mellitus without complication ([place])   . Gout 12/17/2014   patient reported  . Hyperlipidemia   . Hypertension   possible SLE/rheumatoid arthritis following with Dr.Deveshwar.  . Past Surgical History:  Procedure Laterality Date  . CATARACT EXTRACTION Bilateral 2015  . DILATION AND CURETTAGE OF UTERUS      . Social History   Tobacco Use  . Smoking status: Never Smoker  . Smokeless tobacco: Never Used  Substance Use Topics  . Alcohol use: Yes    Comment: occasional  . Drug use: No     ALLERGIES:  is allergic to shellfish allergy.  MEDICATIONS:  Current Outpatient Medications  Medication Sig Dispense Refill  . allopurinol (ZYLOPRIM) 100 MG tablet Take 100 mg by mouth daily.    Hannah [name] amLODipine (NORVASC) 2.5 MG tablet     . aspirin EC 81 MG tablet Take 81 mg by mouth daily.    Hannah [name] atorvastatin (LIPITOR) 80 MG tablet Take 80 mg by mouth daily.    . budesonide-formoterol (SYMBICORT) 160-4.5 MCG/ACT inhaler Take 2 puffs first thing in am and then another 2 puffs about 12 hours later. 6 g 11  . Calcium Carb-Cholecalciferol (CALCIUM 600+D) 600-800 MG-UNIT TABS Take by mouth.    . Cholecalciferol (VITAMIN D PO) Take 1 capsule by mouth daily.    Hannah [name] denosumab (PROLIA) 60 MG/ML SOLN injection Inject 60 mg into the skin every 6 (six) months. Administer in upper arm, thigh, or abdomen 1 Syringe 0  . hydroxychloroquine (PLAQUENIL) 200 MG tablet TAKE 1 TABLET(200 MG) BY MOUTH DAILY 90 tablet 0  . irbesartan (AVAPRO) 300 MG tablet Take 300 mg daily by mouth.    . levothyroxine (SYNTHROID, LEVOTHROID) 50 MCG tablet Take 50 mcg daily before breakfast by mouth. Sunday ONLY    . LUBRICANT EYE DROPS 0.4-0.3 % SOLN     . metFORMIN (GLUCOPHAGE) 500 MG tablet Take 500 mg daily by mouth.     . SYNTHROID 100 MCG tablet TK 1 T PO  QD.  1  . VENTOLIN HFA 108 (90 Base) MCG/ACT inhaler INL 2 PFS PO Q 4 TO 6 H PRN  5  . iron polysaccharides (NIFEREX) 150 MG capsule Take 150 mg 2 (two) times daily by mouth.     No current facility-administered medications for this visit.     PHYSICAL EXAMINATION: ECOG PERFORMANCE STATUS: 1 - Symptomatic but completely ambulatory  . Vitals:   12/09/17 1313  BP: (!) 163/65  Pulse: 79  Resp: 18  Temp: 98.8 F (37.1 C)  SpO2: 97%    Filed Weights   12/09/17 1313  Weight: 160 lb 14.4 oz (73 kg)   .Body mass index is 34.82 kg/m.  Hannah [name] GENERAL:alert, in no acute distress and comfortable SKIN: no acute rashes, no significant lesions EYES: conjunctiva are  pink and non-injected, sclera anicteric OROPHARYNX: MMM, no exudates, no oropharyngeal erythema or ulceration NECK: supple, no JVD LYMPH:  no palpable lymphadenopathy in the cervical, axillary or inguinal regions LUNGS: clear to auscultation b/l with normal respiratory effort HEART: regular rate & rhythm ABDOMEN:  normoactive bowel sounds , non tender, not distended. Extremity: no pedal edema PSYCH: alert & oriented x 3 with fluent speech NEURO: no focal motor/sensory deficits  LABORATORY DATA:   I have reviewed the data as listed  . CBC Latest Ref Rng & Units 12/09/2017 08/12/2017 02/16/2017  WBC 3.9 - 10.3 K/uL 4.5 4.1 4.0  Hemoglobin 11.6 - 15.9 g/dL - 9.9(L) 10.2(L)  Hematocrit 34.8 - 46.6 % 32.7(L) 30.7(L) 31.4(L)  Platelets 145 - 400 K/uL 124(L) 114(L) 125(L)    . CMP Latest Ref Rng & Units 12/09/2017 08/12/2017 08/12/2017  Glucose 70 - 140 mg/dL 84 85 -  BUN 7 - 26 mg/dL 11 20.8 -  Creatinine 0.60 - 1.10 mg/dL 1.00 1.4(H) -  Sodium 136 - 145 mmol/L 141 142 -  Potassium 3.5 - 5.1 mmol/L 4.2 3.7 -  Chloride 98 - 109 mmol/L 107 - -  CO2 22 - 29 mmol/L 27 27 -  Calcium 8.4 - 10.4 mg/dL 9.8 10.3 -  Total Protein 6.4 - 8.3 g/dL 6.9 6.8 6.5  Total Bilirubin 0.2 - 1.2 mg/dL 0.6 0.45 -  Alkaline Phos 40 - 150 U/L 62 51 -  AST 5 - 34 U/L 32 31 -  ALT 0 - 55 U/L 48 38 -  .   Lab Results  Component Value Date   FERRITIN 304 (H) 12/09/2017          RADIOGRAPHIC STUDIES: I have personally reviewed the radiological images as listed and agreed with the findings in the report. No results found. METASTATIC BONE SURVEY 0/02/2015  COMPARISON: None.  FINDINGS: Heart is normal size. Right diaphragmatic hernia again noted, unchanged. Lungs are clear. No effusions.  No focal lytic lesions within the visualized bony structures or acute bony abnormality. Degenerative changes within the shoulders, lower cervical spine, mid to lower thoracic spine, and lower lumbar spine.  Advanced degenerative facet disease in the lower lumbar spine. 9 mm of anterolisthesis of L4 on L5. Endplate sclerosis within L4 and L5. Mild degenerative changes in the hips. Advanced degenerative changes within the knees. 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  IMPRESSION: No focal lytic lesion.  Degenerative joint disease involving multiple joints as described above.  Grade 2 anterolisthesis of L4 on L5 related to facet disease.   ASSESSMENT & PLAN:   72 year old of an American female with  #1 IgG kappa monoclonal gammopathy of undetermined significance. Bone survey  X-ray showed no lytic lesions. M spike is 0.6 g/dL in 10/016 and then 0.5g/dl in 10/2015. SPEP in May/2017 and 07/2016 showed M proteinwas stable at 0.3g/dl  MGUS likely related to underlying connective tissue disorder.  Bone marrow biopsy does show about 13% plasma cells however these appear to be polyclonal and likely related to a possible underlying inflammatory disorder. Less likely a biclonal plasma cell dyscrasia.  #2 Normocytic normochromic anemia likely related to chronic inflammation from recent diagnosis of lupus/rheumatoid arthritis + CKD.  Increased acanthocytes on peripheral blood smear -no overt evidence of liver disease. Could be a marker of some MDS Bone marrow biopsy did not show overt plasma cell dyscrasia or myelodysplastic syndrome. 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 Bone marrow biopsy showed decreased iron stores. Ferritin and iron have been trending down, she has not noticed any issues with bruising or bleeding recently. ?absorption issue. I have given her the option of IV iron which she has previously had without issue. She would like to have this.   Plan: -Discussed pt labwork today; counts are stable, chemistries stable. Ferritin has improved to 304 after IV iron. Hgb improved to 10.5.  Platelets are stable at 124k.  -no indication for additional IV Iron at this time. IV iron prn to maintain ferritin>100 -Given that the pt has chronic inflammation with RA, optimizing her RA treatment will have a positive impact on her anemia.  -We can hold off on PO Iron at this time, and we will check back in 6 months.  -Discussed continuing to optimize her thyroid problems with PCP.   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 Estanislado Pandy. Plan -Continue optimization of her autoimmune disorder treatment with rheumatology Dr. Estanislado Pandy. -She has no symptoms or findings suggestive of multiple myeloma at this time. -will Follow-up on pending labs from today SPEP with [facility] -Continue follow-up with primary care physician and rheumatology.  RTC with Dr Irene Limbo in 6 months with labs   Orders Placed This Encounter  Procedures  . CBC & Diff and Retic    Standing Status:   Future    Standing Expiration Date:   12/10/2018  . CMP ([place] only)    Standing Status:   Future    Standing Expiration Date:   12/10/2018  . Ferritin    Standing Status:   Future    Standing Expiration Date:   12/09/2018  . Iron and TIBC    Standing Status:   Future    Standing Expiration Date:   12/09/2018   . The total time spent in the appointment was 15 minutes and more than 50% was on counseling and direct patient cares.    Sullivan Lone MD [place] AAHIVMS [facility] Hematology/Oncology Physician [place]  (Office):       [phone] (Work cell):  [phone] (Fax):           [phone]  This document serves as a record of services personally performed by Sullivan Lone, MD. It was created on his behalf by Baldwin [place], a trained medical scribe. The creation of this record is based on the scribe's personal observations and the provider's statements to them.   .I have reviewed the above documentation for accuracy and completeness, and I agree with the  above. Brunetta Genera MD MS

## 2017-12-09 ENCOUNTER — Inpatient Hospital Stay: Payer: Medicare HMO

## 2017-12-09 ENCOUNTER — Inpatient Hospital Stay: Payer: Medicare HMO | Attending: Hematology | Admitting: Hematology

## 2017-12-09 ENCOUNTER — Telehealth: Payer: Self-pay | Admitting: Hematology

## 2017-12-09 ENCOUNTER — Encounter: Payer: Self-pay | Admitting: Hematology

## 2017-12-09 VITALS — BP 163/65 | HR 79 | Temp 98.8°F | Resp 18 | Ht <= 58 in | Wt 160.9 lb

## 2017-12-09 DIAGNOSIS — Z7984 Long term (current) use of oral hypoglycemic drugs: Secondary | ICD-10-CM | POA: Diagnosis not present

## 2017-12-09 DIAGNOSIS — M3219 Other organ or system involvement in systemic lupus erythematosus: Secondary | ICD-10-CM

## 2017-12-09 DIAGNOSIS — D649 Anemia, unspecified: Secondary | ICD-10-CM | POA: Insufficient documentation

## 2017-12-09 DIAGNOSIS — E119 Type 2 diabetes mellitus without complications: Secondary | ICD-10-CM | POA: Insufficient documentation

## 2017-12-09 DIAGNOSIS — Z79899 Other long term (current) drug therapy: Secondary | ICD-10-CM | POA: Diagnosis not present

## 2017-12-09 DIAGNOSIS — M25561 Pain in right knee: Secondary | ICD-10-CM | POA: Diagnosis not present

## 2017-12-09 DIAGNOSIS — D472 Monoclonal gammopathy: Secondary | ICD-10-CM

## 2017-12-09 DIAGNOSIS — D508 Other iron deficiency anemias: Secondary | ICD-10-CM

## 2017-12-09 LAB — COMPREHENSIVE METABOLIC PANEL
ALT: 48 U/L (ref 0–55)
AST: 32 U/L (ref 5–34)
Albumin: 3.9 g/dL (ref 3.5–5.0)
Alkaline Phosphatase: 62 U/L (ref 40–150)
Anion gap: 7 (ref 3–11)
BUN: 11 mg/dL (ref 7–26)
CO2: 27 mmol/L (ref 22–29)
Calcium: 9.8 mg/dL (ref 8.4–10.4)
Chloride: 107 mmol/L (ref 98–109)
Creatinine, Ser: 1 mg/dL (ref 0.60–1.10)
GFR calc Af Amer: >60 mL/min (ref >60)
GFR calc non Af Amer: 55 mL/min — ABNORMAL LOW (ref >60)
Glucose, Bld: 84 mg/dL (ref 70–140)
Potassium: 4.2 mmol/L (ref 3.5–5.1)
Sodium: 141 mmol/L (ref 136–145)
Total Bilirubin: 0.6 mg/dL (ref 0.2–1.2)
Total Protein: 6.9 g/dL (ref 6.4–8.3)

## 2017-12-09 LAB — CBC WITH DIFFERENTIAL (CANCER CENTER ONLY)
Basophils Absolute: 0 10*3/uL (ref 0.0–0.1)
Basophils Relative: 1 %
Eosinophils Absolute: 0.4 10*3/uL (ref 0.0–0.5)
Eosinophils Relative: 9 %
HCT: 32.7 % — ABNORMAL LOW (ref 34.8–46.6)
Hemoglobin: 10.5 g/dL — ABNORMAL LOW (ref 11.6–15.9)
Lymphocytes Relative: 18 %
Lymphs Abs: 0.8 10*3/uL — ABNORMAL LOW (ref 0.9–3.3)
MCH: 32.3 pg (ref 25.1–34.0)
MCHC: 32.1 g/dL (ref 31.5–36.0)
MCV: 100.6 fL (ref 79.5–101.0)
Monocytes Absolute: 0.4 10*3/uL (ref 0.1–0.9)
Monocytes Relative: 10 %
Neutro Abs: 2.8 10*3/uL (ref 1.5–6.5)
Neutrophils Relative %: 62 %
Platelet Count: 124 10*3/uL — ABNORMAL LOW (ref 145–400)
RBC: 3.25 MIL/uL — ABNORMAL LOW (ref 3.70–5.45)
RDW: 13.7 % (ref 11.2–14.5)
WBC Count: 4.5 10*3/uL (ref 3.9–10.3)

## 2017-12-09 LAB — RETICULOCYTES
RBC.: 3.25 MIL/uL — ABNORMAL LOW (ref 3.70–5.45)
Retic Count, Absolute: 52 10*3/uL (ref 33.7–90.7)
Retic Ct Pct: 1.6 % (ref 0.7–2.1)

## 2017-12-09 LAB — FERRITIN: Ferritin: 304 ng/mL — ABNORMAL HIGH (ref 9–269)

## 2017-12-09 NOTE — Telephone Encounter (Signed)
Scheduled appts per 3/14 los - patient did not want avs and calendar.

## 2017-12-20 LAB — HM MAMMOGRAPHY: HM Mammogram: NORMAL (ref 0–4)

## 2017-12-27 DIAGNOSIS — Z1231 Encounter for screening mammogram for malignant neoplasm of breast: Secondary | ICD-10-CM | POA: Diagnosis not present

## 2018-01-14 ENCOUNTER — Other Ambulatory Visit: Payer: Self-pay | Admitting: Rheumatology

## 2018-01-14 DIAGNOSIS — M069 Rheumatoid arthritis, unspecified: Secondary | ICD-10-CM

## 2018-01-17 NOTE — Telephone Encounter (Signed)
Last Visit: 09/14/17 Next Visit: 02/03/17 Labs: 12/09/17  stable PLQ Eye Exam: 08/17/17 WNL  Okay to refill per Dr. Estanislado Pandy

## 2018-01-21 NOTE — Progress Notes (Signed)
Office Visit Note  Patient: Hannah [name]             Date of Birth: Aug 12, 1946           MRN: [id number]             PCP: Glendale Chard, MD Referring: Glendale Chard, MD Visit Date: 02/03/2018 Occupation: @GUAROCC @    Subjective:  Bilateral knee pain    History of Present Illness: Hannah [name] is a 72 y.o. female with history of rheumatoid arthritis, systemic lupus erythematosus, osteoporosis, gout, and osteoarthritis.  She denies any recent rheumatoid arthritis, lupus, gout flares.  She continues to take Plaquenil 1 tablet by mouth daily.  She also takes allopurinol 100 mg daily.  She denies any sores in her mouth or nose.  She denies any recent rashes.  She denies any swollen lymph nodes.  She denies any symptoms of Raynaud's at this time.  She continues to have symptoms of dry eyes but no dry mouth.  She denies any worsening fatigue or recent low grade fevers.  She states she is having pain in bilateral knees.  She states her knees feel very stiff and she has difficulty climbing stairs.  She reports she had a right knee cortisone injection on 09/02/17, which provided minimal relief. She has an occasional locking sensation in her left knee and the sensation of her knee giving out. She uses a cane when walking. She denies any pain in her hands or feet at this time.  She reports she performs hand exercises on a regular basis.  She is due for her next Prolia injection in June.  She continues to take calcium and vitamin D.   Activities of Daily Living:  Patient reports morning stiffness for 5-10  minutes.   Patient Denies nocturnal pain.  Difficulty dressing/grooming: Denies Difficulty climbing stairs: Reports Difficulty getting out of chair: Reports Difficulty using hands for taps, buttons, cutlery, and/or writing: Denies   Review of Systems  Constitutional: Positive for fatigue.  HENT: Negative for mouth sores, mouth dryness and nose dryness.   Eyes: Positive for  dryness. Negative for pain and visual disturbance.  Respiratory: Negative for cough, hemoptysis, shortness of breath and difficulty breathing.   Cardiovascular: Negative for chest pain, palpitations, hypertension and swelling in legs/feet.  Gastrointestinal: Negative for blood in stool, constipation and diarrhea.  Endocrine: Negative for increased urination.  Genitourinary: Negative for painful urination.  Musculoskeletal: Positive for arthralgias, joint pain, joint swelling and morning stiffness. Negative for myalgias, muscle weakness, muscle tenderness and myalgias.  Skin: Negative for color change, pallor, rash, hair loss, nodules/bumps, skin tightness, ulcers and sensitivity to sunlight.  Allergic/Immunologic: Negative for susceptible to infections.  Neurological: Negative for dizziness, numbness, headaches and weakness.  Hematological: Negative for swollen glands.  Psychiatric/Behavioral: Positive for sleep disturbance. Negative for depressed mood. The patient is not nervous/anxious.     PMFS History:  Patient Active Problem List   Diagnosis Date Noted  . Iron deficiency anemia 08/12/2017  . Osteoporosis 10/02/2016  . Systemic lupus erythematosus ([place]) 10/02/2016  . Rheumatoid arthritis involving multiple sites with positive rheumatoid factor ([place]) 10/02/2016  . High risk medication use 10/02/2016  . History of chronic kidney disease 10/02/2016  . 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/2016  . Primary osteoarthritis of both knees 10/02/2016  . Vitamin D deficiency 10/02/2016  . History of diabetes mellitus 10/02/2016  . History of hypertension 10/02/2016  . History of asthma 10/02/2016  . Absolute  anemia   . MGUS (monoclonal gammopathy of unknown significance)   . Normocytic anemia 03/21/2015  . Essential hypertension 03/20/2015  . Abnormal CT of the chest 07/30/2012  . Asthma 06/19/2012    Past Medical History:  Diagnosis Date  . Arthritis   .  Asthma   . Diabetes mellitus without complication ([place])   . Gout 12/17/2014   patient reported  . Hyperlipidemia   . Hypertension     Family History  Problem Relation Age of Onset  . Heart disease Father   . Diabetes Father   . Hypertension Mother   . Hypertension Sister   . Hypertension Sister   . Anemia Sister    Past Surgical History:  Procedure Laterality Date  . CATARACT EXTRACTION Bilateral 2015  . DILATION AND CURETTAGE OF UTERUS     Social History   Social History Narrative  . Not on file     Objective: Vital Signs: BP (!) 160/84 (BP Location: Left Arm, Patient Position: Sitting, Cuff Size: Normal)   Pulse 68   Resp 14   Ht 4\' 11"  (1.499 m)   Wt 163 lb 8 oz (74.2 kg)   BMI 33.02 kg/m    Physical Exam  Constitutional: She is oriented to person, place, and time. She appears well-developed and well-nourished.  HENT:  Head: Normocephalic and atraumatic.  Eyes: Conjunctivae and EOM are normal.  Neck: Normal range of motion.  Cardiovascular: Normal rate, regular rhythm, normal heart sounds and intact distal pulses.  Pulmonary/Chest: Effort normal and breath sounds normal.  Abdominal: Soft. Bowel sounds are normal.  Lymphadenopathy:    She has no cervical adenopathy.  Neurological: She is alert and oriented to person, place, and time.  Skin: Skin is warm and dry. Capillary refill takes less than 2 seconds.  Psychiatric: She has a normal mood and affect. Her behavior is normal.  Nursing note and vitals reviewed.    Musculoskeletal Exam: C-spine, thoracic spine, lumbar spine good range of motion.  No midline spinal tenderness.  No SI joint tenderness.  Shoulder joints, elbow joints, wrist joints, MCPs, PIPs, DIPs good range of motion with no synovitis.  She has ulnar deviation and MCP joints.  Hip joints good range of motion with no discomfort.  She has limited range of motion of bilateral knees with discomfort.  Bilateral knee crepitus. She has valgus deformity  of bilateral knees.  Ankle joints, MTPs, PIPs, DIPs good range of motion with no synovitis.  CDAI Exam: CDAI Homunculus Exam:   Tenderness:  RLE: tibiofemoral LLE: tibiofemoral  Joint Counts:  CDAI Tender Joint count: 2 CDAI Swollen Joint count: 0  Global Assessments:  Patient Global Assessment: 7   CDAI Calculated Score: 9    Investigation: No additional findings.PLQ eye exam: 08/17/2017 CBC Latest Ref Rng & Units 12/09/2017 08/12/2017 02/16/2017  WBC 3.9 - 10.3 K/uL 4.5 4.1 4.0  Hemoglobin 11.6 - 15.9 g/dL 10.5(L) 9.9(L) 10.2(L)  Hematocrit 34.8 - 46.6 % 32.7(L) 30.7(L) 31.4(L)  Platelets 145 - 400 K/uL 124(L) 114(L) 125(L)   CMP Latest Ref Rng & Units 12/09/2017 08/12/2017 08/12/2017  Glucose 70 - 140 mg/dL 84 85 -  BUN 7 - 26 mg/dL 11 20.8 -  Creatinine 0.60 - 1.10 mg/dL 1.00 1.4(H) -  Sodium 136 - 145 mmol/L 141 142 -  Potassium 3.5 - 5.1 mmol/L 4.2 3.7 -  Chloride 98 - 109 mmol/L 107 - -  CO2 22 - 29 mmol/L 27 27 -  Calcium 8.4 - 10.4 mg/dL  9.8 10.3 -  Total Protein 6.4 - 8.3 g/dL 6.9 6.8 6.5  Total Bilirubin 0.2 - 1.2 mg/dL 0.6 0.45 -  Alkaline Phos 40 - 150 U/L 62 51 -  AST 5 - 34 U/L 32 31 -  ALT 0 - 55 U/L 48 38 -    Imaging: No results found.  Speciality Comments: PLQ eye exam: 08/17/2017 Normal. Dr. Clent Jacks. Follow up in 1 year.    Procedures:  No procedures performed Allergies: Shellfish allergy   Assessment / Plan:     Visit Diagnoses: Rheumatoid arthritis involving multiple sites with positive rheumatoid factor (HCC) - Positive RF, positive anti-CCP. She has no synovitis on exam.  She has not had any rheumatoid arthritis flares.  She has no pain or swelling in her hands or feet at this time.  She will continue taking Plaquenil 200 mg 1 tablet by mouth daily.    Other systemic lupus erythematosus with other organ involvement (HCC) - Positive ANA, positive Smith, positive RNP: She has not had any recent lupus flares.  She has no oral or nasal  ulcerations on exam.  No cervical lymphadenopathy.  No malar rash noted.  She has no synovitis on exam.  She is having pain in bilateral knee joints.  She has no symptoms of Raynaud's at this time.  She has chronic dry eyes but no symptoms of dry mouth.  She has not had worsening fatigue or low-grade fevers.  She will continue taking Plaquenil 200 mg 1 tablet daily.  Autoimmune labs will be checked with her next lab work. - Plan: COMPLETE METABOLIC PANEL WITH GFR, CBC with Differential/Platelet, Urinalysis, Routine w reflex microscopic, ANA, Anti-DNA antibody, double-stranded, C3 and C4, Sedimentation rate, VITAMIN D 25 Hydroxy (Vit-D Deficiency, Fractures)  High risk medication use - PLQ eye exam: 08/17/2017. CBC and CMP will be drawn in June.  Standing orders are in place.- Plan: COMPLETE METABOLIC PANEL WITH GFR, CBC with Differential/Platelet  Idiopathic chronic gout of multiple sites without tophus -She has not had any recent gout flares.  She continues to take allopurinol 100 mg po qd. uric acid was 5.1 on 09/14/2017.  We will recheck uric acid level with her next labs.  He does not need any refills of allopurinol at this time.- Plan: Uric acid  Primary osteoarthritis of both knees -she is having pain in bilateral knee joints.  She has valgus deformity of bilateral knees and knee crepitus.  She did a cortisone injection of her right knee on 09/04/2017 which provided minimal relief.  She requested a cortisone injection in her right knee and today but her blood pressure was very elevated so we did not proceed with a cortisone injection.  We will apply for [place] injections of bilateral knees.  We obtain x-rays of her left knee which revealed severe osteoarthritis and severe chondral malacia patella.  We discussed the potential for a knee replacement in the future.  She is not ready to proceed with surgery at this time.  She is given a prescription for sleeve knee braces that she can wear for stability.   Referral was also made to physical therapy for lower extremity strengthening.  Plan: Ambulatory referral to Physical Therapy  Age-related osteoporosis without current pathological fracture -she is due for her next Prolia injection in June 2019.  She was made aware that she needs to come for lab work 10 days before and tenderness after her subcutaneous injection.  She continues to take vitamin D and calcium on  a regular basis.  We will recheck vitamin D with her next lab work in June.  T score -3.3, BMD 0.488 07/27/2016 which is decreased by -10% from 07/09/2014.  - Plan: VITAMIN D 25 Hydroxy (Vit-D Deficiency, Fractures)  History of diabetes mellitus  History of anemia  History of hypertension: She was advised to monitor blood pressure closely.  Her blood pressure is very elevated today in the office.  We decided not to move forward with a cortisone injection due to the elevated blood pressure.  History of vitamin D deficiency -Vitamin D level will be checked today.  She takes a calcium vitamin D supplement on a daily basis.  Plan: VITAMIN D 25 Hydroxy (Vit-D Deficiency, Fractures)  History of chronic kidney disease  MGUS (monoclonal gammopathy of unknown significance) - Followed up by Dr. Velvet Bathe.  History of asthma  Chronic pain of left knee -she is having pain and stiffness in her left knee joint.  We performed a x-ray today in the office.  With plan: XR KNEE 3 VIEW LEFT    Orders: Orders Placed This Encounter  Procedures  . Large Joint Inj  . XR KNEE 3 VIEW LEFT  . COMPLETE METABOLIC PANEL WITH GFR  . CBC with Differential/Platelet  . Urinalysis, Routine w reflex microscopic  . ANA  . Anti-DNA antibody, double-stranded  . C3 and C4  . Sedimentation rate  . VITAMIN D 25 Hydroxy (Vit-D Deficiency, Fractures)  . Uric acid  . Ambulatory referral to Physical Therapy   No orders of the defined types were placed in this encounter.   Face-to-face time spent with patient was 30  minutes. >50% of time was spent in counseling and coordination of care.  Follow-Up Instructions: Return in about 5 months (around 07/06/2018) for Rheumatoid arthritis, Systemic lupus erythematosus, Gout, Osteoarthritis, Osteoporosis.   Ofilia Neas, PA-C   I examined and evaluated the patient with Hazel Sams PA.  X-ray findings were discussed with patient.  At this point she is not interested in total knee replacement.  Her autoimmune disease appears to be under control.  Have her monitor her labs .  The plan of care was discussed as noted above.  Bo Merino, MD Note - This record has been created using Editor, commissioning.  Chart creation errors have been sought, but may not always  have been located. Such creation errors do not reflect on  the standard of medical care.

## 2018-02-03 ENCOUNTER — Ambulatory Visit (INDEPENDENT_AMBULATORY_CARE_PROVIDER_SITE_OTHER): Payer: Medicare HMO

## 2018-02-03 ENCOUNTER — Encounter: Payer: Self-pay | Admitting: Rheumatology

## 2018-02-03 ENCOUNTER — Ambulatory Visit: Payer: Medicare HMO | Admitting: Rheumatology

## 2018-02-03 VITALS — BP 160/84 | HR 68 | Resp 14 | Ht 59.0 in | Wt 163.5 lb

## 2018-02-03 DIAGNOSIS — M1A09X0 Idiopathic chronic gout, multiple sites, without tophus (tophi): Secondary | ICD-10-CM

## 2018-02-03 DIAGNOSIS — Z79899 Other long term (current) drug therapy: Secondary | ICD-10-CM

## 2018-02-03 DIAGNOSIS — M25562 Pain in left knee: Secondary | ICD-10-CM | POA: Diagnosis not present

## 2018-02-03 DIAGNOSIS — M0579 Rheumatoid arthritis with rheumatoid factor of multiple sites without organ or systems involvement: Secondary | ICD-10-CM | POA: Diagnosis not present

## 2018-02-03 DIAGNOSIS — Z8709 Personal history of other diseases of the respiratory system: Secondary | ICD-10-CM | POA: Diagnosis not present

## 2018-02-03 DIAGNOSIS — G8929 Other chronic pain: Secondary | ICD-10-CM

## 2018-02-03 DIAGNOSIS — M3219 Other organ or system involvement in systemic lupus erythematosus: Secondary | ICD-10-CM | POA: Diagnosis not present

## 2018-02-03 DIAGNOSIS — M170 Bilateral primary osteoarthritis of knee: Secondary | ICD-10-CM

## 2018-02-03 DIAGNOSIS — Z87448 Personal history of other diseases of urinary system: Secondary | ICD-10-CM

## 2018-02-03 DIAGNOSIS — Z8679 Personal history of other diseases of the circulatory system: Secondary | ICD-10-CM | POA: Diagnosis not present

## 2018-02-03 DIAGNOSIS — Z8639 Personal history of other endocrine, nutritional and metabolic disease: Secondary | ICD-10-CM

## 2018-02-03 DIAGNOSIS — D472 Monoclonal gammopathy: Secondary | ICD-10-CM | POA: Diagnosis not present

## 2018-02-03 DIAGNOSIS — M810 Age-related osteoporosis without current pathological fracture: Secondary | ICD-10-CM

## 2018-02-03 DIAGNOSIS — Z862 Personal history of diseases of the blood and blood-forming organs and certain disorders involving the immune mechanism: Secondary | ICD-10-CM | POA: Diagnosis not present

## 2018-02-03 NOTE — Patient Instructions (Signed)
Standing Labs We placed an order today for your standing lab work.    Please come back and get your standing labs in June and every 3 months  We have open lab Monday through Friday from 8:30-11:30 AM and 1:30-4:00 PM  at the office of Dr. Shaili Deveshwar.   You may experience shorter wait times on Monday and Friday afternoons. The office is located at [address], [address], [place], [place] [address] No appointment is necessary.   Labs are drawn by [facility].  You may receive a bill from [facility] for your lab work. If you have any questions regarding directions or hours of operation,  please call [phone].    

## 2018-02-23 ENCOUNTER — Ambulatory Visit: Payer: Medicare HMO | Attending: Physician Assistant | Admitting: Physical Therapy

## 2018-02-23 ENCOUNTER — Encounter: Payer: Self-pay | Admitting: Physical Therapy

## 2018-02-23 ENCOUNTER — Other Ambulatory Visit: Payer: Self-pay

## 2018-02-23 DIAGNOSIS — M25561 Pain in right knee: Secondary | ICD-10-CM | POA: Insufficient documentation

## 2018-02-23 DIAGNOSIS — R2689 Other abnormalities of gait and mobility: Secondary | ICD-10-CM | POA: Insufficient documentation

## 2018-02-23 DIAGNOSIS — M25562 Pain in left knee: Secondary | ICD-10-CM | POA: Diagnosis not present

## 2018-02-23 DIAGNOSIS — G8929 Other chronic pain: Secondary | ICD-10-CM | POA: Insufficient documentation

## 2018-02-23 DIAGNOSIS — M6281 Muscle weakness (generalized): Secondary | ICD-10-CM | POA: Insufficient documentation

## 2018-02-23 NOTE — Therapy (Signed)
[facility] [place], [place], [address] Phone: [phone]   Fax:  [phone]  Physical Therapy Evaluation  Patient Details  Name: Hannah [name] MRN: [id number] Date of Birth: Nov 04, 1945 Referring Provider: Ofilia Neas, PA-C   Encounter Date: 02/23/2018  PT End of Session - 02/23/18 0841    Visit Number  1    Number of Visits  13    Date for PT Re-Evaluation  04/06/18    PT Start Time  0801    PT Stop Time  8366    PT Time Calculation (min)  43 min    Activity Tolerance  Patient tolerated treatment well    Behavior During Therapy  [facility] for tasks assessed/performed       Past Medical History:  Diagnosis Date  . Arthritis   . Asthma   . Diabetes mellitus without complication ([place])   . Gout 12/17/2014   patient reported  . Hyperlipidemia   . Hypertension     Past Surgical History:  Procedure Laterality Date  . CATARACT EXTRACTION Bilateral 2015  . DILATION AND CURETTAGE OF UTERUS      There were no vitals filed for this visit.   Subjective Assessment - 02/23/18 0804    Subjective  pt is a 72 y.o F with CC of bil knee pain that has been going on for over 15 years from just wear and tear within the last 6-7 months the pain seems to be progressing with no specific onset. hx of multiple cortisone injections with improvement for a year occasional. pain stays mostly in the knee and feels like a theres a lot of stiffness and nots increased difficulty getting out of chair which is progressively getting more difficult.  has hx of buckling of the knees unsure of which one but it hasn't happend recently but is fearfull of it giving way.     Limitations  Walking stairs    How long can you sit comfortably?  unlimited    How long can you stand comfortably?  30-40 min     How long can you walk comfortably?  30 min     Diagnostic tests  x-ray    Patient Stated Goals  going up and down stairs normally. get  better answer regarding getting a TKA, reduce stiffness, improve strength     Currently in Pain?  Yes    Pain Score  4     Pain Orientation  Right;Left R knee posteriorly, L knee anterior    Pain Descriptors / Indicators  -- stiffness    Pain Type  Chronic pain    Pain Onset  More than a month ago    Pain Frequency  Constant    Aggravating Factors   walking for long periods time,     Pain Relieving Factors  topical ointments, ice/ heat,     Effect of Pain on Daily Activities  limited endurance and navigating walking up/ down steps         [facility] PT Assessment - 02/23/18 0804      Assessment   Medical Diagnosis  bil knee arthritis    Referring Provider  Ofilia Neas, PA-C    Onset Date/Surgical Date  -- 58 years    Hand Dominance  Right    Next MD Visit  03/07/2018    Prior Therapy  yes mulitple times across mulitple states      Precautions   Precautions  None  Restrictions   Weight Bearing Restrictions  No      Balance Screen   Has the patient fallen in the past 6 months  No    Has the patient had a decrease in activity level because of a fear of falling?   No    Is the patient reluctant to leave their home because of a fear of falling?   No      Home Environment   Living Environment  Private residence    Living Arrangements  Alone    Type of [place] Access  Level entry    Home Layout  One level    [place] - single point      Prior Function   Level of Independence  Independent with basic ADLs    Vocation  Retired    Leisure  movies, plays, Barrister's clerk, gardening, traveling      Cognition   Overall Cognitive Status  Within Functional Limits for tasks assessed      Observation/Other Assessments   Focus on Therapeutic Outcomes (FOTO)   49% limited predicted 42% limited      Posture/Postural Control   Posture/Postural Control  Postural limitations    Postural Limitations  Rounded Shoulders;Forward head      ROM / Strength   AROM /  PROM / Strength  AROM;PROM;Strength      AROM   AROM Assessment Site  Knee;Hip    Right/Left Knee  Right;Left    Right Knee Extension  10    Right Knee Flexion  103    Left Knee Extension  5    Left Knee Flexion  108      PROM   PROM Assessment Site  Knee    Right/Left Knee  Right;Left    Right Knee Extension  8 ERP    Right Knee Flexion  110 ERP    Left Knee Extension  2    Left Knee Flexion  116 ERP      Strength   Strength Assessment Site  Hip;Knee    Right/Left Hip  Right;Left    Right Hip Flexion  4-/5    Right Hip Extension  3/5 trunk rotation noted during testing    Right Hip ABduction  3/5    Right Hip ADduction  3/5    Left Hip Flexion  4-/5    Left Hip Extension  3/5 trunk rotation during testing    Left Hip ABduction  3/5    Left Hip ADduction  3/5    Right/Left Knee  Right;Left    Right Knee Flexion  4-/5    Right Knee Extension  3+/5 pain during testing in the posterior aspec tof th eknee    Left Knee Flexion  4/5    Left Knee Extension  4/5 crepitus during testing      Palpation   Patella mobility  L/R patella hypomobility,     Palpation comment  L  knee: medial pole of the patella,R knee distal hamstring tightness worse medial than lateral, with minimal lateral patellar pole soreness      Ambulation/Gait   Ambulation/Gait  Yes    Assistive device  None    Gait Pattern  Step-through pattern;Decreased stride length;Trendelenburg;Antalgic                Objective measurements completed on examination: See above findings.      [facility] Adult PT Treatment/Exercise - 02/23/18 1610  Knee/Hip Exercises: Stretches   Active Hamstring Stretch  2 reps;30 seconds      Knee/Hip Exercises: Supine   Bridges  2 sets;10 reps    Straight Leg Raises  2 sets;10 reps      Knee/Hip Exercises: Sidelying   Hip ABduction  1 set;10 reps;Both;Strengthening             PT Education - 02/23/18 0846    Education provided  Yes    Education Details   evaluation findings, POC, goals, HEP with proper form/ rationale, anatomy of the knee/ hamstrings    Person(s) Educated  Patient    Methods  Explanation;Verbal cues;Handout    Comprehension  Verbalized understanding;Verbal cues required       PT Short Term Goals - 02/23/18 0924      PT SHORT TERM GOAL #1   Title  pt to be i with inital HEP     Time  3    Period  Weeks    Status  New    Target Date  03/16/18      PT SHORT TERM GOAL #2   Title  pt to demo techniques to prevent/ reduce pain and inflammation via rice/ HEP    Time  3    Period  Weeks    Status  New    Target Date  04/06/18        PT Long Term Goals - 02/23/18 0937      PT LONG TERM GOAL #1   Title  increase bil knee extension to </= 3 degrees and >/= 115 degrees to promote functional and efficient gait pattern with </= 1/10 pain     Time  6    Period  Weeks    Status  New    Target Date  04/06/18      PT LONG TERM GOAL #2   Title  increase bil LE strenght to >/= 4+/5 to promote stability and safety with walking and navigate steps     Time  6    Period  Weeks    Status  New    Target Date  04/06/18      PT LONG TERM GOAL #3   Title  pt to be able to walk and navigate >/= 12 steps reciprocally with </= 1/10 pain with no report of feeling like her knee is going to buckle     Time  6    Period  Weeks    Status  New    Target Date  04/06/18      PT LONG TERM GOAL #4   Title  increase FOTO score to </= 42% limited to demo improvement in function     Time  6    Period  Weeks    Status  New    Target Date  04/06/18      PT LONG TERM GOAL #5   Title  pt to be I with all HEP given as of last visit to maintain and progress current level of function     Time  6    Period  Weeks    Status  New    Target Date  04/06/18             Plan - 02/23/18 0920    Clinical Impression Statement  Mrs Rappa presents to OPPT with CC of bil knee pain thats been present for 15 years with recent exacerbation  abot 6-7 months ago with no specific onset. she  demonstrates limited knee mobility bil with R>L with pain during testing, limited strength noted in bil LE's. TTP noted in bil knees with R knee being posteriorly along distal hamsrings and L being along medial patella/ joint line. She would benefit from physical therapy to decrease knee pain, improve strength/ mobility, promote safety with stairs and community ambulate, maximize funciton by addressing the deficits listed.     History and Personal Factors relevant to plan of care:  pt lives alone, hx of arthritis    Clinical Presentation  Evolving    Clinical Presentation due to:  limited knee mobility, limited bil LE strength, bil knee pain    Rehab Potential  Good    PT Frequency  2x / week    PT Duration  6 weeks    PT Treatment/Interventions  ADLs/Self Care Home Management;Cryotherapy;Electrical Stimulation;Iontophoresis 4mg /ml Dexamethasone;Moist Heat;Taping;Therapeutic activities;Therapeutic exercise;Manual techniques;Dry needling;Patient/family education;Passive range of motion;Ultrasound;Gait training;Stair training;Balance training    PT Next Visit Plan  review/ update HEP, hip/ knee strengthening, hamstring stretching, gait training, step up/ stair straining,     PT Home Exercise Plan  Sidelying hip abduction, bridges, SLR, hamstring stretch    Consulted and Agree with Plan of Care  Patient       Patient will benefit from skilled therapeutic intervention in order to improve the following deficits and impairments:  Abnormal gait, Pain, Decreased range of motion, Postural dysfunction, Improper body mechanics, Decreased knowledge of precautions, Decreased strength, Decreased activity tolerance, Decreased endurance  Visit Diagnosis: Chronic pain of right knee  Chronic pain of left knee  Muscle weakness (generalized)  Other abnormalities of gait and mobility     Problem List Patient Active Problem List   Diagnosis Date Noted  . Iron  deficiency anemia 08/12/2017  . Osteoporosis 10/02/2016  . Systemic lupus erythematosus ([place]) 10/02/2016  . Rheumatoid arthritis involving multiple sites with positive rheumatoid factor ([place]) 10/02/2016  . High risk medication use 10/02/2016  . History of chronic kidney disease 10/02/2016  . 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/2016  . Primary osteoarthritis of both knees 10/02/2016  . Vitamin D deficiency 10/02/2016  . History of diabetes mellitus 10/02/2016  . History of hypertension 10/02/2016  . History of asthma 10/02/2016  . Absolute anemia   . MGUS (monoclonal gammopathy of unknown significance)   . Normocytic anemia 03/21/2015  . Essential hypertension 03/20/2015  . Abnormal CT of the chest 07/30/2012  . Asthma 06/19/2012   [place] PT, DPT, LAT, ATC  02/23/18  9:55 AM      [facility] [address] [place], [place], [address] Phone: [phone]   Fax:  [phone]  Name: Hannah [name] MRN: [id number] Date of Birth: Oct 06, 1945

## 2018-02-28 ENCOUNTER — Ambulatory Visit: Payer: Medicare HMO | Attending: Physician Assistant | Admitting: Physical Therapy

## 2018-02-28 ENCOUNTER — Encounter: Payer: Self-pay | Admitting: Physical Therapy

## 2018-02-28 DIAGNOSIS — R2689 Other abnormalities of gait and mobility: Secondary | ICD-10-CM | POA: Insufficient documentation

## 2018-02-28 DIAGNOSIS — M6281 Muscle weakness (generalized): Secondary | ICD-10-CM | POA: Diagnosis not present

## 2018-02-28 DIAGNOSIS — G8929 Other chronic pain: Secondary | ICD-10-CM | POA: Insufficient documentation

## 2018-02-28 DIAGNOSIS — M25561 Pain in right knee: Secondary | ICD-10-CM | POA: Insufficient documentation

## 2018-02-28 DIAGNOSIS — M25562 Pain in left knee: Secondary | ICD-10-CM | POA: Insufficient documentation

## 2018-02-28 NOTE — Therapy (Signed)
[place], [place], [address] Phone: [phone]   Fax:  [phone]  Physical Therapy Treatment  Patient Details  Name: Hannah [name] MRN: [id number] Date of Birth: 1946/05/02 Referring Provider: Ofilia Neas, PA-C   Encounter Date: 02/28/2018  PT End of Session - 02/28/18 1056    Visit Number  2    Number of Visits  13    Date for PT Re-Evaluation  04/06/18    PT Start Time  1324    PT Stop Time  4010    PT Time Calculation (min)  43 min       Past Medical History:  Diagnosis Date  . Arthritis   . Asthma   . Diabetes mellitus without complication ([place])   . Gout 12/17/2014   patient reported  . Hyperlipidemia   . Hypertension     Past Surgical History:  Procedure Laterality Date  . CATARACT EXTRACTION Bilateral 2015  . DILATION AND CURETTAGE OF UTERUS      There were no vitals filed for this visit.  Subjective Assessment - 02/28/18 1022    Subjective  I am sore and I have questions about the exercises. Right knee hurts in the back and the left knee hurts in the front. I had a lot of pain stepping up onto a shuttle bus.     Currently in Pain?  Yes    Pain Score  6     Pain Location  Knee    Pain Orientation  Right;Left                       [facility] Adult PT Treatment/Exercise - 02/28/18 0001      Knee/Hip Exercises: Stretches   Active Hamstring Stretch  30 seconds;3 reps      Knee/Hip Exercises: Aerobic   Nustep  5 minutes LE only x 5 min      Knee/Hip Exercises: Standing   Heel Raises  10 reps      Knee/Hip Exercises: Seated   Long Arc Quad  20 reps    Sit to [employer]  10 reps;without UE support elevated seat      Knee/Hip Exercises: Supine   Quad Sets  10 reps;Right;Left    Short Arc Quad Sets  10 reps;2 sets;Right;Left    Heel Slides  10 reps;Right;Left    Bridges  10 reps    Straight Leg Raises  10 reps      Knee/Hip Exercises: Sidelying   Hip  ABduction  1 set;10 reps;Both;Strengthening             PT Education - 02/28/18 1053    Education provided  Yes    Education Details  HEP    Person(s) Educated  Patient    Methods  Explanation;Handout    Comprehension  Verbalized understanding       PT Short Term Goals - 02/23/18 0924      PT SHORT TERM GOAL #1   Title  pt to be i with inital HEP     Time  3    Period  Weeks    Status  New    Target Date  03/16/18      PT SHORT TERM GOAL #2   Title  pt to demo techniques to prevent/ reduce pain and inflammation via rice/ HEP    Time  3    Period  Weeks    Status  New  Target Date  04/06/18        PT Long Term Goals - 02/23/18 0937      PT LONG TERM GOAL #1   Title  increase bil knee extension to </= 3 degrees and >/= 115 degrees to promote functional and efficient gait pattern with </= 1/10 pain     Time  6    Period  Weeks    Status  New    Target Date  04/06/18      PT LONG TERM GOAL #2   Title  increase bil LE strenght to >/= 4+/5 to promote stability and safety with walking and navigate steps     Time  6    Period  Weeks    Status  New    Target Date  04/06/18      PT LONG TERM GOAL #3   Title  pt to be able to walk and navigate >/= 12 steps reciprocally with </= 1/10 pain with no report of feeling like her knee is going to buckle     Time  6    Period  Weeks    Status  New    Target Date  04/06/18      PT LONG TERM GOAL #4   Title  increase FOTO score to </= 42% limited to demo improvement in function     Time  6    Period  Weeks    Status  New    Target Date  04/06/18      PT LONG TERM GOAL #5   Title  pt to be I with all HEP given as of last visit to maintain and progress current level of function     Time  6    Period  Weeks    Status  New    Target Date  04/06/18            Plan - 02/28/18 1053    Clinical Impression Statement  Pt reports soreness in anterior hip with HEP. We reviewed HEP and progressed therex. Cues  required to hold hamstring stretch 30 sec- she purchased a leash for home. Addded sit-stands, she needs elevated seat. Began Nustep. At end of session pt felt soreness.     PT Next Visit Plan  review/ update HEP, hip/ knee strengthening, hamstring stretching, gait training, step up/ stair straining, soft tissue to right hamstring     PT Home Exercise Plan  Sidelying hip abduction, bridges, SLR, hamstring stretch, sit-stand from elevated seat       Patient will benefit from skilled therapeutic intervention in order to improve the following deficits and impairments:  Abnormal gait, Pain, Decreased range of motion, Postural dysfunction, Improper body mechanics, Decreased knowledge of precautions, Decreased strength, Decreased activity tolerance, Decreased endurance  Visit Diagnosis: Chronic pain of right knee  Chronic pain of left knee  Muscle weakness (generalized)  Other abnormalities of gait and mobility     Problem List Patient Active Problem List   Diagnosis Date Noted  . Iron deficiency anemia 08/12/2017  . Osteoporosis 10/02/2016  . Systemic lupus erythematosus ([place]) 10/02/2016  . Rheumatoid arthritis involving multiple sites with positive rheumatoid factor ([place]) 10/02/2016  . High risk medication use 10/02/2016  . History of chronic kidney disease 10/02/2016  . 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/2016  . Primary osteoarthritis of both knees 10/02/2016  . Vitamin D deficiency 10/02/2016  . History of diabetes mellitus 10/02/2016  . History of hypertension  10/02/2016  . History of asthma 10/02/2016  . Absolute anemia   . MGUS (monoclonal gammopathy of unknown significance)   . Normocytic anemia 03/21/2015  . Essential hypertension 03/20/2015  . Abnormal CT of the chest 07/30/2012  . Asthma 06/19/2012    Dorene Ar, PTA 02/28/2018, 11:08 AM  [facility] [address] [place], [place], [address] Phone: [phone]   Fax:  [phone]  Name: Hannah [name] MRN: [id number] Date of Birth: 1945-12-03

## 2018-03-02 ENCOUNTER — Encounter: Payer: Self-pay | Admitting: Physical Therapy

## 2018-03-02 ENCOUNTER — Ambulatory Visit: Payer: Medicare HMO | Admitting: Physical Therapy

## 2018-03-02 DIAGNOSIS — M25561 Pain in right knee: Secondary | ICD-10-CM | POA: Diagnosis not present

## 2018-03-02 DIAGNOSIS — M25562 Pain in left knee: Secondary | ICD-10-CM

## 2018-03-02 DIAGNOSIS — R2689 Other abnormalities of gait and mobility: Secondary | ICD-10-CM

## 2018-03-02 DIAGNOSIS — M6281 Muscle weakness (generalized): Secondary | ICD-10-CM | POA: Diagnosis not present

## 2018-03-02 DIAGNOSIS — G8929 Other chronic pain: Secondary | ICD-10-CM

## 2018-03-02 NOTE — Therapy (Signed)
[place], [place], [address] Phone: [phone]   Fax:  [phone]  Physical Therapy Treatment  Patient Details  Name: Hannah [name] MRN: [id number] Date of Birth: 03/06/1946 Referring Provider: Ofilia Neas, PA-C   Encounter Date: 03/02/2018  PT End of Session - 03/02/18 1103    Visit Number  3    Number of Visits  13    Date for PT Re-Evaluation  04/06/18    PT Start Time  3825    PT Stop Time  1105    PT Time Calculation (min)  50 min       Past Medical History:  Diagnosis Date  . Arthritis   . Asthma   . Diabetes mellitus without complication ([place])   . Gout 12/17/2014   patient reported  . Hyperlipidemia   . Hypertension     Past Surgical History:  Procedure Laterality Date  . CATARACT EXTRACTION Bilateral 2015  . DILATION AND CURETTAGE OF UTERUS      There were no vitals filed for this visit.  Subjective Assessment - 03/02/18 1014    Subjective  Back of the right knee feels a little better. Left knee still a little achey. Better than I was last visit.     Currently in Pain?  Yes    Pain Score  4     Pain Location  Knee    Pain Orientation  Right;Left    Pain Descriptors / Indicators  Aching stiffnesss    Aggravating Factors   certain movements, certain stretches     Pain Relieving Factors  topical ointments, ice/heat                        [facility] Adult PT Treatment/Exercise - 03/02/18 0001      Knee/Hip Exercises: Stretches   Active Hamstring Stretch  30 seconds;3 reps strap     Gastroc Stretch  2 reps;20 seconds runners stretch, some pain with lunge on opp knee    Other Knee/Hip Stretches  slant board stretch      Knee/Hip Exercises: Aerobic   Nustep  L 5 minutes UE/LE only x 5 min      Knee/Hip Exercises: Standing   Heel Raises  20 reps    Knee Flexion  10 reps;2 sets at chair      Knee/Hip Exercises: Seated   Long Arc Quad  20 reps 2#    Sit  to [employer]  10 reps;without UE support elevated seat      Knee/Hip Exercises: Supine   Quad Sets  10 reps;Right;Left;2 sets towel used for cues    Short Arc Quad Sets  10 reps;2 sets;Right;Left 2# left only, right caused increased pain    Heel Slides  10 reps;Right;Left    Bridges  10 reps    Straight Leg Raises  10 reps    Other Supine Knee/Hip Exercises  ball squeeze x 10       Knee/Hip Exercises: Sidelying   Hip ABduction  1 set;10 reps;Both;Strengthening               PT Short Term Goals - 02/23/18 0539      PT SHORT TERM GOAL #1   Title  pt to be i with inital HEP     Time  3    Period  Weeks    Status  New    Target Date  03/16/18  PT SHORT TERM GOAL #2   Title  pt to demo techniques to prevent/ reduce pain and inflammation via rice/ HEP    Time  3    Period  Weeks    Status  New    Target Date  04/06/18        PT Long Term Goals - 02/23/18 0937      PT LONG TERM GOAL #1   Title  increase bil knee extension to </= 3 degrees and >/= 115 degrees to promote functional and efficient gait pattern with </= 1/10 pain     Time  6    Period  Weeks    Status  New    Target Date  04/06/18      PT LONG TERM GOAL #2   Title  increase bil LE strenght to >/= 4+/5 to promote stability and safety with walking and navigate steps     Time  6    Period  Weeks    Status  New    Target Date  04/06/18      PT LONG TERM GOAL #3   Title  pt to be able to walk and navigate >/= 12 steps reciprocally with </= 1/10 pain with no report of feeling like her knee is going to buckle     Time  6    Period  Weeks    Status  New    Target Date  04/06/18      PT LONG TERM GOAL #4   Title  increase FOTO score to </= 42% limited to demo improvement in function     Time  6    Period  Weeks    Status  New    Target Date  04/06/18      PT LONG TERM GOAL #5   Title  pt to be I with all HEP given as of last visit to maintain and progress current level of function     Time  6     Period  Weeks    Status  New    Target Date  04/06/18            Plan - 03/02/18 1104    Clinical Impression Statement  Pt reports a little less soreness and stiffness. Continued with strengthening and stretching per POC. Added resistance to open chain exercises.  Some increased pain with right SAQ so removed the weight. At end of session she reports less knee pain.     PT Next Visit Plan  review/ update HEP, hip/ knee strengthening, hamstring stretching, gait training, step up/ stair straining, soft tissue to right hamstring     PT Home Exercise Plan  Sidelying hip abduction, bridges, SLR, hamstring stretch, sit-stand from elevated seat    Consulted and Agree with Plan of Care  Patient       Patient will benefit from skilled therapeutic intervention in order to improve the following deficits and impairments:  Abnormal gait, Pain, Decreased range of motion, Postural dysfunction, Improper body mechanics, Decreased knowledge of precautions, Decreased strength, Decreased activity tolerance, Decreased endurance  Visit Diagnosis: Chronic pain of right knee  Chronic pain of left knee  Muscle weakness (generalized)  Other abnormalities of gait and mobility     Problem List Patient Active Problem List   Diagnosis Date Noted  . Iron deficiency anemia 08/12/2017  . Osteoporosis 10/02/2016  . Systemic lupus erythematosus ([place]) 10/02/2016  . Rheumatoid arthritis involving multiple sites with positive rheumatoid factor ([place])  10/02/2016  . High risk medication use 10/02/2016  . History of chronic kidney disease 10/02/2016  . 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/2016  . Primary osteoarthritis of both knees 10/02/2016  . Vitamin D deficiency 10/02/2016  . History of diabetes mellitus 10/02/2016  . History of hypertension 10/02/2016  . History of asthma 10/02/2016  . Absolute anemia   . MGUS (monoclonal gammopathy of unknown significance)   . Normocytic  anemia 03/21/2015  . Essential hypertension 03/20/2015  . Abnormal CT of the chest 07/30/2012  . Asthma 06/19/2012    Dorene Ar, PTA 03/02/2018, 11:18 AM  [facility] [address] [place], [place], [address] Phone: [phone]   Fax:  [phone]  Name: Hannah [name] MRN: [id number] Date of Birth: [birth year]

## 2018-03-07 ENCOUNTER — Other Ambulatory Visit: Payer: Self-pay | Admitting: *Deleted

## 2018-03-07 ENCOUNTER — Ambulatory Visit: Payer: Medicare HMO | Admitting: Physical Therapy

## 2018-03-07 ENCOUNTER — Encounter: Payer: Self-pay | Admitting: Physical Therapy

## 2018-03-07 DIAGNOSIS — M25561 Pain in right knee: Principal | ICD-10-CM

## 2018-03-07 DIAGNOSIS — M25562 Pain in left knee: Secondary | ICD-10-CM

## 2018-03-07 DIAGNOSIS — M1A09X0 Idiopathic chronic gout, multiple sites, without tophus (tophi): Secondary | ICD-10-CM

## 2018-03-07 DIAGNOSIS — M6281 Muscle weakness (generalized): Secondary | ICD-10-CM | POA: Diagnosis not present

## 2018-03-07 DIAGNOSIS — M3219 Other organ or system involvement in systemic lupus erythematosus: Secondary | ICD-10-CM | POA: Diagnosis not present

## 2018-03-07 DIAGNOSIS — Z8639 Personal history of other endocrine, nutritional and metabolic disease: Secondary | ICD-10-CM

## 2018-03-07 DIAGNOSIS — G8929 Other chronic pain: Secondary | ICD-10-CM | POA: Diagnosis not present

## 2018-03-07 DIAGNOSIS — R2689 Other abnormalities of gait and mobility: Secondary | ICD-10-CM | POA: Diagnosis not present

## 2018-03-07 DIAGNOSIS — M810 Age-related osteoporosis without current pathological fracture: Secondary | ICD-10-CM | POA: Diagnosis not present

## 2018-03-07 DIAGNOSIS — Z79899 Other long term (current) drug therapy: Secondary | ICD-10-CM | POA: Diagnosis not present

## 2018-03-07 NOTE — Therapy (Signed)
[place], [place], [address] Phone: [phone]   Fax:  [phone]  Physical Therapy Treatment  Patient Details  Name: Hannah [name] MRN: [id number] Date of Birth: 1946/04/09 Referring Provider: Ofilia Neas, PA-C   Encounter Date: 03/07/2018  PT End of Session - 03/07/18 1055    Visit Number  4    Number of Visits  13    Date for PT Re-Evaluation  04/06/18    PT Start Time  1016    PT Stop Time  1059    PT Time Calculation (min)  43 min    Activity Tolerance  Patient tolerated treatment well    Behavior During Therapy  [facility] for tasks assessed/performed       Past Medical History:  Diagnosis Date  . Arthritis   . Asthma   . Diabetes mellitus without complication ([place])   . Gout 12/17/2014   patient reported  . Hyperlipidemia   . Hypertension     Past Surgical History:  Procedure Laterality Date  . CATARACT EXTRACTION Bilateral 2015  . DILATION AND CURETTAGE OF UTERUS      There were no vitals filed for this visit.  Subjective Assessment - 03/07/18 1020    Subjective  "the knees still seem to have some stiffness, the exercise is helping which is great"     Currently in Pain?  Yes    Pain Score  5     Pain Orientation  Right;Left    Pain Descriptors / Indicators  Aching    Pain Type  Chronic pain    Pain Onset  More than a month ago    Pain Frequency  Intermittent                       [facility] Adult PT Treatment/Exercise - 03/07/18 1023      Knee/Hip Exercises: Stretches   Active Hamstring Stretch  2 reps;Both;30 seconds seated in chair    Gastroc Stretch  2 reps;30 seconds      Knee/Hip Exercises: Aerobic   Nustep  L5 minutes UE/LE only x 6 min      Knee/Hip Exercises: Standing   Hip Abduction  2 sets;10 reps    Hip Extension  2 sets;10 reps;Knee straight      Knee/Hip Exercises: Seated   Sit to Sand  2 sets;10 reps with ball between the knees           Balance Exercises - 03/07/18 1057      Balance Exercises: Standing   Standing Eyes Opened  1 rep;30 secs    Standing Eyes Closed  2 reps;30 secs        PT Education - 03/07/18 1054    Education provided  Yes    Education Details  updated HEP for standing hip strengthening.     Person(s) Educated  Patient    Methods  Explanation;Verbal cues    Comprehension  Verbalized understanding;Verbal cues required       PT Short Term Goals - 02/23/18 0924      PT SHORT TERM GOAL #1   Title  pt to be i with inital HEP     Time  3    Period  Weeks    Status  New    Target Date  03/16/18      PT SHORT TERM GOAL #2   Title  pt to demo techniques to prevent/ reduce pain and inflammation via  rice/ HEP    Time  3    Period  Weeks    Status  New    Target Date  04/06/18        PT Long Term Goals - 02/23/18 0937      PT LONG TERM GOAL #1   Title  increase bil knee extension to </= 3 degrees and >/= 115 degrees to promote functional and efficient gait pattern with </= 1/10 pain     Time  6    Period  Weeks    Status  New    Target Date  04/06/18      PT LONG TERM GOAL #2   Title  increase bil LE strenght to >/= 4+/5 to promote stability and safety with walking and navigate steps     Time  6    Period  Weeks    Status  New    Target Date  04/06/18      PT LONG TERM GOAL #3   Title  pt to be able to walk and navigate >/= 12 steps reciprocally with </= 1/10 pain with no report of feeling like her knee is going to buckle     Time  6    Period  Weeks    Status  New    Target Date  04/06/18      PT LONG TERM GOAL #4   Title  increase FOTO score to </= 42% limited to demo improvement in function     Time  6    Period  Weeks    Status  New    Target Date  04/06/18      PT LONG TERM GOAL #5   Title  pt to be I with all HEP given as of last visit to maintain and progress current level of function     Time  6    Period  Weeks    Status  New    Target Date  04/06/18             Plan - 03/07/18 1055    Clinical Impression Statement  pt continues to make progress with improved pain in the knees. Continued to work on strengthening progressing to standing which she required minimal cues for proper. added balance today which she demonstrates mild postural sway with narrow base.     PT Next Visit Plan  update HEP, practice stairs, hip/ knee strengthening, hamstring stretching, gait training, step up/ stair straining, soft tissue to right hamstring     PT Home Exercise Plan  Sidelying hip abduction, bridges, SLR, hamstring stretch, sit-stand from elevated seat    Consulted and Agree with Plan of Care  Patient       Patient will benefit from skilled therapeutic intervention in order to improve the following deficits and impairments:  Abnormal gait, Pain, Decreased range of motion, Postural dysfunction, Improper body mechanics, Decreased knowledge of precautions, Decreased strength, Decreased activity tolerance, Decreased endurance  Visit Diagnosis: Chronic pain of right knee  Chronic pain of left knee  Muscle weakness (generalized)  Other abnormalities of gait and mobility     Problem List Patient Active Problem List   Diagnosis Date Noted  . Iron deficiency anemia 08/12/2017  . Osteoporosis 10/02/2016  . Systemic lupus erythematosus ([place]) 10/02/2016  . Rheumatoid arthritis involving multiple sites with positive rheumatoid factor ([place]) 10/02/2016  . High risk medication use 10/02/2016  . History of chronic kidney disease 10/02/2016  . Idiopathic chronic gout of  multiple sites without tophus 10/02/2016  . Primary osteoarthritis of both knees 10/02/2016  . Vitamin D deficiency 10/02/2016  . History of diabetes mellitus 10/02/2016  . History of hypertension 10/02/2016  . History of asthma 10/02/2016  . Absolute anemia   . MGUS (monoclonal gammopathy of unknown significance)   . Normocytic anemia 03/21/2015  . Essential hypertension 03/20/2015   . Abnormal CT of the chest 07/30/2012  . Asthma 06/19/2012   [place] PT, DPT, LAT, ATC  03/07/18  11:05 AM      [facility] [address] [place], [place], [address] Phone: [phone]   Fax:  [phone]  Name: HEVIN JEFFCOAT MRN: [id number] Date of Birth: 29-Aug-1946

## 2018-03-09 ENCOUNTER — Ambulatory Visit: Payer: Medicare HMO | Admitting: Physical Therapy

## 2018-03-09 ENCOUNTER — Encounter: Payer: Self-pay | Admitting: Physical Therapy

## 2018-03-09 DIAGNOSIS — M6281 Muscle weakness (generalized): Secondary | ICD-10-CM

## 2018-03-09 DIAGNOSIS — M25562 Pain in left knee: Secondary | ICD-10-CM | POA: Diagnosis not present

## 2018-03-09 DIAGNOSIS — M25561 Pain in right knee: Secondary | ICD-10-CM | POA: Diagnosis not present

## 2018-03-09 DIAGNOSIS — R2689 Other abnormalities of gait and mobility: Secondary | ICD-10-CM

## 2018-03-09 DIAGNOSIS — G8929 Other chronic pain: Secondary | ICD-10-CM

## 2018-03-09 NOTE — Therapy (Signed)
[place], [place], [address] Phone: [phone]   Fax:  [phone]  Physical Therapy Treatment  Patient Details  Name: Hannah [name] MRN: [id number] Date of Birth: 07/22/1946 Referring Provider: Ofilia Neas, PA-C   Encounter Date: 03/09/2018  PT End of Session - 03/09/18 1059    Visit Number  5    Number of Visits  13    Date for PT Re-Evaluation  04/06/18    PT Start Time  2831    PT Stop Time  5176    PT Time Calculation (min)  43 min       Past Medical History:  Diagnosis Date  . Arthritis   . Asthma   . Diabetes mellitus without complication ([place])   . Gout 12/17/2014   patient reported  . Hyperlipidemia   . Hypertension     Past Surgical History:  Procedure Laterality Date  . CATARACT EXTRACTION Bilateral 2015  . DILATION AND CURETTAGE OF UTERUS      There were no vitals filed for this visit.  Subjective Assessment - 03/09/18 1022    Subjective  Knees are feeling better. I just have a couple of spots that still hurt.     Currently in Pain?  Yes    Pain Score  3     Pain Location  Knee    Pain Orientation  Right;Left;Lateral;Posterior    Pain Descriptors / Indicators  Aching    Aggravating Factors   certain movements, certain stretces     Pain Relieving Factors  topical ointments/heat                       [facility] Adult PT Treatment/Exercise - 03/09/18 0001      Knee/Hip Exercises: Stretches   Active Hamstring Stretch  2 reps;Both;30 seconds seated in chair    Gastroc Stretch  3 reps;10 seconds;Left;Right      Knee/Hip Exercises: Aerobic   Nustep  5 minutes LE only x 5 min      Knee/Hip Exercises: Standing   Heel Raises  20 reps    Knee Flexion  10 reps;2 sets at chair    Hip Abduction  2 sets;10 reps    Hip Extension  2 sets;10 reps;Knee straight      Knee/Hip Exercises: Seated   Sit to Sand  2 sets;10 reps with ball between the knees      Manual Therapy   Manual Therapy  Soft tissue mobilization    Soft tissue mobilization  Soft tissue work to left peroneals and right medial hamstring.                PT Short Term Goals - 02/23/18 0924      PT SHORT TERM GOAL #1   Title  pt to be i with inital HEP     Time  3    Period  Weeks    Status  New    Target Date  03/16/18      PT SHORT TERM GOAL #2   Title  pt to demo techniques to prevent/ reduce pain and inflammation via rice/ HEP    Time  3    Period  Weeks    Status  New    Target Date  04/06/18        PT Long Term Goals - 02/23/18 0937      PT LONG TERM GOAL #1   Title  increase bil  knee extension to </= 3 degrees and >/= 115 degrees to promote functional and efficient gait pattern with </= 1/10 pain     Time  6    Period  Weeks    Status  New    Target Date  04/06/18      PT LONG TERM GOAL #2   Title  increase bil LE strenght to >/= 4+/5 to promote stability and safety with walking and navigate steps     Time  6    Period  Weeks    Status  New    Target Date  04/06/18      PT LONG TERM GOAL #3   Title  pt to be able to walk and navigate >/= 12 steps reciprocally with </= 1/10 pain with no report of feeling like her knee is going to buckle     Time  6    Period  Weeks    Status  New    Target Date  04/06/18      PT LONG TERM GOAL #4   Title  increase FOTO score to </= 42% limited to demo improvement in function     Time  6    Period  Weeks    Status  New    Target Date  04/06/18      PT LONG TERM GOAL #5   Title  pt to be I with all HEP given as of last visit to maintain and progress current level of function     Time  6    Period  Weeks    Status  New    Target Date  04/06/18            Plan - 03/09/18 1057    Clinical Impression Statement  Pt reports decreasing pain levels. She reports pain still present posterior right knee and at lateral lower leg on right LE. Soft tissue work performed to these areas with decreased  tenderness reported afterward.     PT Next Visit Plan  update HEP, practice stairs, hip/ knee strengthening, hamstring stretching, gait training, step up/ stair straining, soft tissue to right hamstring and left peroneal    PT Home Exercise Plan  Sidelying hip abduction, bridges, SLR, hamstring stretch, sit-stand from elevated seat, standing hip abduction and extension;    Consulted and Agree with Plan of Care  Patient       Patient will benefit from skilled therapeutic intervention in order to improve the following deficits and impairments:  Abnormal gait, Pain, Decreased range of motion, Postural dysfunction, Improper body mechanics, Decreased knowledge of precautions, Decreased strength, Decreased activity tolerance, Decreased endurance  Visit Diagnosis: Chronic pain of right knee  Chronic pain of left knee  Muscle weakness (generalized)  Other abnormalities of gait and mobility     Problem List Patient Active Problem List   Diagnosis Date Noted  . Iron deficiency anemia 08/12/2017  . Osteoporosis 10/02/2016  . Systemic lupus erythematosus ([place]) 10/02/2016  . Rheumatoid arthritis involving multiple sites with positive rheumatoid factor ([place]) 10/02/2016  . High risk medication use 10/02/2016  . History of chronic kidney disease 10/02/2016  . 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/2016  . Primary osteoarthritis of both knees 10/02/2016  . Vitamin D deficiency 10/02/2016  . History of diabetes mellitus 10/02/2016  . History of hypertension 10/02/2016  . History of asthma 10/02/2016  . Absolute anemia   . MGUS (monoclonal gammopathy of unknown significance)   . Normocytic  anemia 03/21/2015  . Essential hypertension 03/20/2015  . Abnormal CT of the chest 07/30/2012  . Asthma 06/19/2012    Dorene Ar, PTA 03/09/2018, 11:02 AM  [facility] [address] [place], [place], [address] Phone:  [phone]   Fax:  [phone]  Name: Hannah [name] MRN: [id number] Date of Birth: [birth year]

## 2018-03-10 DIAGNOSIS — R82998 Other abnormal findings in urine: Secondary | ICD-10-CM | POA: Diagnosis not present

## 2018-03-10 LAB — CBC WITH DIFFERENTIAL/PLATELET
Basophils Absolute: 41 cells/uL (ref 0–200)
Basophils Relative: 0.8 %
Eosinophils Absolute: 383 cells/uL (ref 15–500)
Eosinophils Relative: 7.5 %
HCT: 31.9 % — ABNORMAL LOW (ref 35.0–45.0)
Hemoglobin: 10.9 g/dL — ABNORMAL LOW (ref 11.7–15.5)
Lymphs Abs: 1051 cells/uL (ref 850–3900)
MCH: 31.1 pg (ref 27.0–33.0)
MCHC: 34.2 g/dL (ref 32.0–36.0)
MCV: 90.9 fL (ref 80.0–100.0)
MPV: 11.7 fL (ref 7.5–12.5)
Monocytes Relative: 10.4 %
Neutro Abs: 3096 cells/uL (ref 1500–7800)
Neutrophils Relative %: 60.7 %
Platelets: 137 10*3/uL — ABNORMAL LOW (ref 140–400)
RBC: 3.51 10*6/uL — ABNORMAL LOW (ref 3.80–5.10)
RDW: 12.4 % (ref 11.0–15.0)
Total Lymphocyte: 20.6 %
WBC mixed population: 530 cells/uL (ref 200–950)
WBC: 5.1 10*3/uL (ref 3.8–10.8)

## 2018-03-10 LAB — ANTI-NUCLEAR AB-TITER (ANA TITER): ANA Titer 1: 1:320 {titer} — ABNORMAL HIGH

## 2018-03-10 LAB — TEST AUTHORIZATION 2

## 2018-03-10 LAB — URINALYSIS, ROUTINE W REFLEX MICROSCOPIC
Bilirubin Urine: NEGATIVE
Glucose, UA: NEGATIVE
Hgb urine dipstick: NEGATIVE
Hyaline Cast: NONE SEEN /LPF
Ketones, ur: NEGATIVE
Nitrite: NEGATIVE
Specific Gravity, Urine: 1.018 (ref 1.001–1.03)
pH: <=5 (ref 5.0–8.0)

## 2018-03-10 LAB — COMPLETE METABOLIC PANEL WITH GFR
AG Ratio: 1.6 (calc) (ref 1.0–2.5)
ALT: 25 U/L (ref 6–29)
AST: 23 U/L (ref 10–35)
Albumin: 3.9 g/dL (ref 3.6–5.1)
Alkaline phosphatase (APISO): 57 U/L (ref 33–130)
BUN/Creatinine Ratio: 16 (calc) (ref 6–22)
BUN: 16 mg/dL (ref 7–25)
CO2: 29 mmol/L (ref 20–32)
Calcium: 9.3 mg/dL (ref 8.6–10.4)
Chloride: 105 mmol/L (ref 98–110)
Creat: 0.99 mg/dL — ABNORMAL HIGH (ref 0.60–0.93)
GFR, Est African American: 66 mL/min/{1.73_m2} (ref >=60)
GFR, Est Non African American: 57 mL/min/{1.73_m2} — ABNORMAL LOW (ref >=60)
Globulin: 2.5 g/dL (calc) (ref 1.9–3.7)
Glucose, Bld: 76 mg/dL (ref 65–99)
Potassium: 4.3 mmol/L (ref 3.5–5.3)
Sodium: 140 mmol/L (ref 135–146)
Total Bilirubin: 0.5 mg/dL (ref 0.2–1.2)
Total Protein: 6.4 g/dL (ref 6.1–8.1)

## 2018-03-10 LAB — ANTI-DNA ANTIBODY, DOUBLE-STRANDED: ds DNA Ab: <1 IU/mL

## 2018-03-10 LAB — ANTI-SMITH ANTIBODY: ENA SM Ab Ser-aCnc: <1 AI

## 2018-03-10 LAB — SJOGRENS SYNDROME-A EXTRACTABLE NUCLEAR ANTIBODY: SSA (Ro) (ENA) Antibody, IgG: <1 AI

## 2018-03-10 LAB — URIC ACID: Uric Acid, Serum: 4.7 mg/dL (ref 2.5–7.0)

## 2018-03-10 LAB — TEST AUTHORIZATION

## 2018-03-10 LAB — ANTI-SCLERODERMA ANTIBODY: Scleroderma (Scl-70) (ENA) Antibody, IgG: <1 AI

## 2018-03-10 LAB — SJOGRENS SYNDROME-B EXTRACTABLE NUCLEAR ANTIBODY: SSB (La) (ENA) Antibody, IgG: <1 AI

## 2018-03-10 LAB — C3 AND C4
C3 Complement: 118 mg/dL (ref 83–193)
C4 Complement: 39 mg/dL (ref 15–57)

## 2018-03-10 LAB — SEDIMENTATION RATE: Sed Rate: 9 mm/h (ref 0–30)

## 2018-03-10 LAB — RNP ANTIBODY: Ribonucleic Protein(ENA) Antibody, IgG: <1 AI

## 2018-03-10 LAB — ANA: Anti Nuclear Antibody(ANA): POSITIVE — AB

## 2018-03-10 LAB — VITAMIN D 25 HYDROXY (VIT D DEFICIENCY, FRACTURES): Vit D, 25-Hydroxy: 38 ng/mL (ref 30–100)

## 2018-03-10 NOTE — Progress Notes (Signed)
Add ENA, UA shows bacteria. Pt should see her PCP.

## 2018-03-14 ENCOUNTER — Other Ambulatory Visit: Payer: Self-pay | Admitting: Rheumatology

## 2018-03-14 NOTE — Telephone Encounter (Signed)
Last Visit: 02/03/18 Next visit: 07/06/18 Labs: 03/07/18 stable  Okay to refill per Dr. Estanislado Pandy

## 2018-03-15 ENCOUNTER — Telehealth: Payer: Self-pay | Admitting: Rheumatology

## 2018-03-15 ENCOUNTER — Ambulatory Visit: Payer: Medicare HMO | Admitting: Physical Therapy

## 2018-03-15 DIAGNOSIS — M25562 Pain in left knee: Secondary | ICD-10-CM

## 2018-03-15 DIAGNOSIS — M25561 Pain in right knee: Principal | ICD-10-CM

## 2018-03-15 DIAGNOSIS — M6281 Muscle weakness (generalized): Secondary | ICD-10-CM

## 2018-03-15 DIAGNOSIS — G8929 Other chronic pain: Secondary | ICD-10-CM | POA: Diagnosis not present

## 2018-03-15 DIAGNOSIS — R2689 Other abnormalities of gait and mobility: Secondary | ICD-10-CM | POA: Diagnosis not present

## 2018-03-15 NOTE — Telephone Encounter (Signed)
Hannah [name] from [facility] called stating the prescription of Prolia is requiring a prior authorization which she sent to [employer].  The PA Key is  [id number]    If you have any questions, please call me back at [phone]

## 2018-03-15 NOTE — Therapy (Addendum)
[place], [place], [address] Phone: [phone]   Fax:  [phone]  Physical Therapy Treatment  Patient Details  Name: Hannah [name] MRN: [id number] Date of Birth: 1946-04-13 Referring Provider: Ofilia Neas, PA-C   Encounter Date: 03/15/2018  PT End of Session - 03/15/18 1020    Visit Number  6    Number of Visits  13    Date for PT Re-Evaluation  04/06/18    PT Start Time  1020    PT Stop Time  1102    PT Time Calculation (min)  42 min    Activity Tolerance  Patient tolerated treatment well    Behavior During Therapy  [facility] for tasks assessed/performed       Past Medical History:  Diagnosis Date  . Arthritis   . Asthma   . Diabetes mellitus without complication ([place])   . Gout 12/17/2014   patient reported  . Hyperlipidemia   . Hypertension     Past Surgical History:  Procedure Laterality Date  . CATARACT EXTRACTION Bilateral 2015  . DILATION AND CURETTAGE OF UTERUS      There were no vitals filed for this visit.  Subjective Assessment - 03/15/18 1021    Subjective  "I feel good. I have pain in the backs and sides of my knee."    Currently in Pain?  Yes    Pain Score  5     Pain Location  Knee    Pain Orientation  Right;Left;Posterior;Lateral    Pain Descriptors / Indicators  Tightness;Aching    Pain Onset  More than a month ago    Pain Frequency  Intermittent    Aggravating Factors   transitioning from sit to stand     Pain Relieving Factors  heat                       [facility] Adult PT Treatment/Exercise - 03/15/18 0001      Therapeutic Activites    Other Therapeutic Activities  4 stairs x 2. Cued pt to point toe during descent to decrease amount of knee flexion required on stance leg. Stretched in middle of steps, resulting in decreased pain for pt.       Knee/Hip Exercises: Stretches   Active Hamstring Stretch  Both;2 reps;30 seconds PNF hold relax  technique    Gastroc Stretch  2 reps;30 seconds on slant board      Knee/Hip Exercises: Aerobic   Nustep  L5 x 5 min      Knee/Hip Exercises: Standing   Stairs  --      Knee/Hip Exercises: Seated   Sit to Sand  2 sets;10 reps 1st set with theraband, 2nd set with ball squeeze      Knee/Hip Exercises: Supine   Short Arc Quad Sets  Both;10 reps;Strengthening with ball squeeze;pt reports pain in L knee    Bridges  Strengthening;10 reps    Straight Leg Raises  Strengthening;Both;10 reps    Other Supine Knee/Hip Exercises  clams with red theraband 2 x 10             PT Education - 03/15/18 1108    Education provided  Yes    Education Details  HEP, stair negotiation and proper form for stair training    Person(s) Educated  Patient    Methods  Explanation;Demonstration;Verbal cues    Comprehension  Verbalized understanding       PT Short  Term Goals - 02/23/18 0924      PT SHORT TERM GOAL #1   Title  pt to be i with inital HEP     Time  3    Period  Weeks    Status  New    Target Date  03/16/18      PT SHORT TERM GOAL #2   Title  pt to demo techniques to prevent/ reduce pain and inflammation via rice/ HEP    Time  3    Period  Weeks    Status  New    Target Date  04/06/18        PT Long Term Goals - 02/23/18 0937      PT LONG TERM GOAL #1   Title  increase bil knee extension to </= 3 degrees and >/= 115 degrees to promote functional and efficient gait pattern with </= 1/10 pain     Time  6    Period  Weeks    Status  New    Target Date  04/06/18      PT LONG TERM GOAL #2   Title  increase bil LE strenght to >/= 4+/5 to promote stability and safety with walking and navigate steps     Time  6    Period  Weeks    Status  New    Target Date  04/06/18      PT LONG TERM GOAL #3   Title  pt to be able to walk and navigate >/= 12 steps reciprocally with </= 1/10 pain with no report of feeling like her knee is going to buckle     Time  6    Period  Weeks     Status  New    Target Date  04/06/18      PT LONG TERM GOAL #4   Title  increase FOTO score to </= 42% limited to demo improvement in function     Time  6    Period  Weeks    Status  New    Target Date  04/06/18      PT LONG TERM GOAL #5   Title  pt to be I with all HEP given as of last visit to maintain and progress current level of function     Time  6    Period  Weeks    Status  New    Target Date  04/06/18            Plan - 03/15/18 1109    Clinical Impression Statement  Pt reports she still has pain in posterior and lateral aspect of B knees. Treatment focused on stretching, hip and knee strengthening, and stair negotiation. Attempted sit to stand with red theraband, however, pt reporting pain on lateral aspect of knee. Changed to ball squeze during sit to stand to encourage better alignment of knees over ankles. Pt stated she had decreased pain with this adjustment. Pt reporting pain on lateral aspect of R knee when descending steps. Pt reports decreased pain when pointing toes during descent.     Rehab Potential  Good    PT Frequency  2x / week    PT Duration  6 weeks    PT Treatment/Interventions  ADLs/Self Care Home Management;Cryotherapy;Electrical Stimulation;Iontophoresis 4mg /ml Dexamethasone;Moist Heat;Taping;Therapeutic activities;Therapeutic exercise;Manual techniques;Dry needling;Patient/family education;Passive range of motion;Ultrasound;Gait training;Stair training;Balance training    PT Next Visit Plan  update HEP, practice stairs, hip/ knee strengthening, hamstring and gastroc stretching, stair training  PT Home Exercise Plan  Sidelying hip abduction, bridges, SLR, hamstring stretch, sit-stand from elevated seat, standing hip abduction and extension;    Consulted and Agree with Plan of Care  Patient       Patient will benefit from skilled therapeutic intervention in order to improve the following deficits and impairments:  Abnormal gait, Pain, Decreased range  of motion, Postural dysfunction, Improper body mechanics, Decreased knowledge of precautions, Decreased strength, Decreased activity tolerance, Decreased endurance  Visit Diagnosis: Chronic pain of right knee  Chronic pain of left knee  Muscle weakness (generalized)  Other abnormalities of gait and mobility     Problem List Patient Active Problem List   Diagnosis Date Noted  . Iron deficiency anemia 08/12/2017  . Osteoporosis 10/02/2016  . Systemic lupus erythematosus ([place]) 10/02/2016  . Rheumatoid arthritis involving multiple sites with positive rheumatoid factor ([place]) 10/02/2016  . High risk medication use 10/02/2016  . History of chronic kidney disease 10/02/2016  . 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/2016  . Primary osteoarthritis of both knees 10/02/2016  . Vitamin D deficiency 10/02/2016  . History of diabetes mellitus 10/02/2016  . History of hypertension 10/02/2016  . History of asthma 10/02/2016  . Absolute anemia   . MGUS (monoclonal gammopathy of unknown significance)   . Normocytic anemia 03/21/2015  . Essential hypertension 03/20/2015  . Abnormal CT of the chest 07/30/2012  . Asthma 06/19/2012   Worthy Flank, SPT 03/15/18 1:15 PM   [facility] [address] [place], [place], [address] Phone: [phone]   Fax:  [phone]  Name: Hannah [name] MRN: [id number] Date of Birth: June 28, 1946

## 2018-03-15 NOTE — Telephone Encounter (Signed)
Pharmacy calling in reference to [place]. When do we need medication sent over for patient? Please call to advise.

## 2018-03-15 NOTE — Telephone Encounter (Signed)
Spoke with Cyr[facility]ourning and they are getting the prescription ready now.

## 2018-03-17 ENCOUNTER — Ambulatory Visit: Payer: Medicare HMO | Admitting: Physical Therapy

## 2018-03-17 DIAGNOSIS — G8929 Other chronic pain: Secondary | ICD-10-CM | POA: Diagnosis not present

## 2018-03-17 DIAGNOSIS — M25562 Pain in left knee: Secondary | ICD-10-CM

## 2018-03-17 DIAGNOSIS — M25561 Pain in right knee: Principal | ICD-10-CM

## 2018-03-17 DIAGNOSIS — R2689 Other abnormalities of gait and mobility: Secondary | ICD-10-CM

## 2018-03-17 DIAGNOSIS — M6281 Muscle weakness (generalized): Secondary | ICD-10-CM | POA: Diagnosis not present

## 2018-03-17 NOTE — Therapy (Addendum)
[place], [place], [address] Phone: [phone]   Fax:  [phone]  Physical Therapy Treatment  Patient Details  Name: Hannah [name] MRN: [id number] Date of Birth: 1946-07-15 Referring Provider: Ofilia Neas, PA-C   Encounter Date: 03/17/2018  PT End of Session - 03/17/18 1018    Visit Number  7    Number of Visits  13    Date for PT Re-Evaluation  04/06/18    PT Start Time  1018    PT Stop Time  1102    PT Time Calculation (min)  44 min    Activity Tolerance  Patient tolerated treatment well    Behavior During Therapy  [facility] for tasks assessed/performed       Past Medical History:  Diagnosis Date  . Arthritis   . Asthma   . Diabetes mellitus without complication ([place])   . Gout 12/17/2014   patient reported  . Hyperlipidemia   . Hypertension     Past Surgical History:  Procedure Laterality Date  . CATARACT EXTRACTION Bilateral 2015  . DILATION AND CURETTAGE OF UTERUS      There were no vitals filed for this visit.  Subjective Assessment - 03/17/18 1018    Subjective  "I feel alright. I am still having pain on the side of my L knee and the back of my R knee. I felt like my R knee was going to give out last night."    Currently in Pain?  Yes    Pain Score  6     Pain Location  Knee    Pain Orientation  Right;Left;Posterior;Lateral    Pain Descriptors / Indicators  Aching    Pain Type  Chronic pain    Pain Onset  More than a month ago    Pain Frequency  Intermittent                       [facility] Adult PT Treatment/Exercise - 03/17/18 0001      Knee/Hip Exercises: Stretches   Passive Hamstring Stretch  30 seconds;Both;1 rep    Gastroc Stretch  Both;2 reps;30 seconds      Knee/Hip Exercises: Aerobic   Nustep  L4 x 5 min BUE and BLE      Knee/Hip Exercises: Standing   Forward Step Up  1 set;15 reps;Both HHA and UE support. utilized distraction technique       Manual Therapy   Manual Therapy  Soft tissue mobilization    Manual therapy comments  MTPr R hamstring & L peroneal    Soft tissue mobilization  IASTM to L peroneal and R hamstring             PT Education - 03/17/18 1112    Education provided  Yes    Education Details  HEP, purpose of IASTM    Person(s) Educated  Patient    Methods  Explanation    Comprehension  Verbalized understanding       PT Short Term Goals - 02/23/18 0924      PT SHORT TERM GOAL #1   Title  pt to be i with inital HEP     Time  3    Period  Weeks    Status  New    Target Date  03/16/18      PT SHORT TERM GOAL #2   Title  pt to demo techniques to prevent/ reduce pain and inflammation via rice/  HEP    Time  3    Period  Weeks    Status  New    Target Date  04/06/18        PT Long Term Goals - 02/23/18 0937      PT LONG TERM GOAL #1   Title  increase bil knee extension to </= 3 degrees and >/= 115 degrees to promote functional and efficient gait pattern with </= 1/10 pain     Time  6    Period  Weeks    Status  New    Target Date  04/06/18      PT LONG TERM GOAL #2   Title  increase bil LE strenght to >/= 4+/5 to promote stability and safety with walking and navigate steps     Time  6    Period  Weeks    Status  New    Target Date  04/06/18      PT LONG TERM GOAL #3   Title  pt to be able to walk and navigate >/= 12 steps reciprocally with </= 1/10 pain with no report of feeling like her knee is going to buckle     Time  6    Period  Weeks    Status  New    Target Date  04/06/18      PT LONG TERM GOAL #4   Title  increase FOTO score to </= 42% limited to demo improvement in function     Time  6    Period  Weeks    Status  New    Target Date  04/06/18      PT LONG TERM GOAL #5   Title  pt to be I with all HEP given as of last visit to maintain and progress current level of function     Time  6    Period  Weeks    Status  New    Target Date  04/06/18             Plan - 03/17/18 1113    Clinical Impression Statement  Pt reporting continued pain on lower lateral LLE and posterior aspect of R knee. She felt like her R knee was going to give out on last night. Treatment focused on IASTM and MTPr of L peroneals and R hamstring musculature. Also practiced forward step-ups in which pt struggled ascending and descending with RLE. She states she is fearful of the pain. Utilized distraction technique while pt completed step-ups and noted a decrease in apprehension from pt and decreased need for assistance.     PT Next Visit Plan  update HEP, practice stairs, hip/ knee strengthening, hamstring and gastroc stretching, soft tissue mobilization as needed    PT Home Exercise Plan  Sidelying hip abduction, bridges, SLR, hamstring stretch, sit-stand from elevated seat, standing hip abduction and extension;    Consulted and Agree with Plan of Care  Patient       Patient will benefit from skilled therapeutic intervention in order to improve the following deficits and impairments:  Abnormal gait, Pain, Decreased range of motion, Postural dysfunction, Improper body mechanics, Decreased knowledge of precautions, Decreased strength, Decreased activity tolerance, Decreased endurance  Visit Diagnosis: Chronic pain of right knee  Chronic pain of left knee  Muscle weakness (generalized)  Other abnormalities of gait and mobility     Problem List Patient Active Problem List   Diagnosis Date Noted  . Iron deficiency anemia 08/12/2017  .  Osteoporosis 10/02/2016  . Systemic lupus erythematosus ([place]) 10/02/2016  . Rheumatoid arthritis involving multiple sites with positive rheumatoid factor ([place]) 10/02/2016  . High risk medication use 10/02/2016  . History of chronic kidney disease 10/02/2016  . 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/2016  . Primary osteoarthritis of both knees 10/02/2016  . Vitamin D deficiency 10/02/2016  . History  of diabetes mellitus 10/02/2016  . History of hypertension 10/02/2016  . History of asthma 10/02/2016  . Absolute anemia   . MGUS (monoclonal gammopathy of unknown significance)   . Normocytic anemia 03/21/2015  . Essential hypertension 03/20/2015  . Abnormal CT of the chest 07/30/2012  . Asthma 06/19/2012    Worthy Flank, SPT 03/17/18 1:11 PM   [place] [address] [place], [place], [address] Phone: [phone]   Fax:  [phone]  Name: DONNIA POPLASKI MRN: [id number] Date of Birth: 01/16/1946

## 2018-03-21 ENCOUNTER — Encounter: Payer: Medicare HMO | Admitting: Physical Therapy

## 2018-03-21 ENCOUNTER — Telehealth: Payer: Self-pay | Admitting: Rheumatology

## 2018-03-21 ENCOUNTER — Ambulatory Visit: Payer: Medicare HMO | Admitting: Physical Therapy

## 2018-03-21 DIAGNOSIS — M6281 Muscle weakness (generalized): Secondary | ICD-10-CM | POA: Diagnosis not present

## 2018-03-21 DIAGNOSIS — R2689 Other abnormalities of gait and mobility: Secondary | ICD-10-CM

## 2018-03-21 DIAGNOSIS — G8929 Other chronic pain: Secondary | ICD-10-CM

## 2018-03-21 DIAGNOSIS — M25562 Pain in left knee: Secondary | ICD-10-CM | POA: Diagnosis not present

## 2018-03-21 DIAGNOSIS — M25561 Pain in right knee: Secondary | ICD-10-CM | POA: Diagnosis not present

## 2018-03-21 NOTE — Telephone Encounter (Signed)
Per patient Urine Analysis done at GP's office was neg for infection. Patient request a copy of lab results from here to be sent to GP Dr. Glendale Chard. Fax# [phone]

## 2018-03-21 NOTE — Therapy (Addendum)
[place], [place], [address] Phone: [phone]   Fax:  [phone]  Physical Therapy Treatment  Patient Details  Name: Hannah [name] MRN: [id number] Date of Birth: 1945/10/26 Referring Provider: Ofilia Neas, PA-C   Encounter Date: 03/21/2018  PT End of Session - 03/21/18 1505    Visit Number  8    Number of Visits  13    Date for PT Re-Evaluation  04/06/18    PT Start Time  9381    PT Stop Time  1501    PT Time Calculation (min)  39 min    Activity Tolerance  Patient tolerated treatment well    Behavior During Therapy  [facility] for tasks assessed/performed       Past Medical History:  Diagnosis Date  . Arthritis   . Asthma   . Diabetes mellitus without complication ([place])   . Gout 12/17/2014   patient reported  . Hyperlipidemia   . Hypertension     Past Surgical History:  Procedure Laterality Date  . CATARACT EXTRACTION Bilateral 2015  . DILATION AND CURETTAGE OF UTERUS      There were no vitals filed for this visit.  Subjective Assessment - 03/21/18 1425    Subjective  I am doing good today. I am still have pain in the back of my knee and the side of my knee. The massage helped last time."    Currently in Pain?  Yes    Pain Score  5     Pain Location  Knee    Pain Orientation  Right;Left;Lateral;Posterior    Pain Descriptors / Indicators  Aching    Pain Type  Chronic pain    Pain Onset  More than a month ago    Pain Frequency  Intermittent                       [facility] Adult PT Treatment/Exercise - 03/21/18 1427      Therapeutic Activites    Other Therapeutic Activities  ascending and descending 4 steps x 5. Pt instructed to stand straight and tuck bottom Cues for keeping knee straight on descent and pacing      Knee/Hip Exercises: Stretches   Passive Hamstring Stretch  Both;30 seconds;3 reps    Gastroc Stretch  Both;2 reps;30 seconds      Knee/Hip Exercises:  Aerobic   Nustep  L5 x 5 min LEs only      Knee/Hip Exercises: Seated   Other Seated Knee/Hip Exercises  hip ER with band 2 x 10 Bilaterally    Sit to Sand  2 sets;5 reps;without UE support ball squeeze. Cues for correct foot placement      Ankle Exercises: Seated   Heel Raises  Both;10 reps x 2 sets. Toes inward to target post. tib             PT Education - 03/21/18 1504    Education provided  Yes    Education Details  HEP update    Person(s) Educated  Patient    Methods  Explanation;Handout    Comprehension  Verbalized understanding       PT Short Term Goals - 02/23/18 0924      PT SHORT TERM GOAL #1   Title  pt to be i with inital HEP     Time  3    Period  Weeks    Status  New    Target Date  03/16/18      PT SHORT TERM GOAL #2   Title  pt to demo techniques to prevent/ reduce pain and inflammation via rice/ HEP    Time  3    Period  Weeks    Status  New    Target Date  04/06/18        PT Long Term Goals - 02/23/18 0937      PT LONG TERM GOAL #1   Title  increase bil knee extension to </= 3 degrees and >/= 115 degrees to promote functional and efficient gait pattern with </= 1/10 pain     Time  6    Period  Weeks    Status  New    Target Date  04/06/18      PT LONG TERM GOAL #2   Title  increase bil LE strenght to >/= 4+/5 to promote stability and safety with walking and navigate steps     Time  6    Period  Weeks    Status  New    Target Date  04/06/18      PT LONG TERM GOAL #3   Title  pt to be able to walk and navigate >/= 12 steps reciprocally with </= 1/10 pain with no report of feeling like her knee is going to buckle     Time  6    Period  Weeks    Status  New    Target Date  04/06/18      PT LONG TERM GOAL #4   Title  increase FOTO score to </= 42% limited to demo improvement in function     Time  6    Period  Weeks    Status  New    Target Date  04/06/18      PT LONG TERM GOAL #5   Title  pt to be I with all HEP given as of  last visit to maintain and progress current level of function     Time  6    Period  Weeks    Status  New    Target Date  04/06/18            Plan - 03/21/18 1506    Clinical Impression Statement  Pt reports she is doing well today, but she is still experiencing pain in the posterior and lateral aspect of her knees. She states the IASTM gave her relief for the rest of the day following last weeks session. Today's session focused on stretching of gastrocs and hamstrings and strengthening of hips and posterior tib. Pt demonstrating improved ability to ascend and descend steps when provided with cues and modifications.     PT Treatment/Interventions  ADLs/Self Care Home Management;Cryotherapy;Electrical Stimulation;Iontophoresis 4mg /ml Dexamethasone;Moist Heat;Taping;Therapeutic activities;Therapeutic exercise;Manual techniques;Dry needling;Patient/family education;Passive range of motion;Ultrasound;Gait training;Stair training;Balance training    PT Next Visit Plan  FOTO, update HEP, IASTM prn, practice stairs, hip ER strenghthening, posterior tib strengthening, gastroc and hamstring stretching    PT Home Exercise Plan  Sidelying hip abduction, bridges, SLR, hamstring stretch, sit-stand from elevated seat, standing hip abduction and extension; hip ER ex with theraband, heel raises with inversion    Consulted and Agree with Plan of Care  Patient       Patient will benefit from skilled therapeutic intervention in order to improve the following deficits and impairments:  Abnormal gait, Pain, Decreased range of motion, Postural dysfunction, Improper body mechanics, Decreased knowledge of precautions, Decreased strength, Decreased activity tolerance,  Decreased endurance  Visit Diagnosis: Chronic pain of right knee  Chronic pain of left knee  Muscle weakness (generalized)  Other abnormalities of gait and mobility     Problem List Patient Active Problem List   Diagnosis Date Noted  .  Iron deficiency anemia 08/12/2017  . Osteoporosis 10/02/2016  . Systemic lupus erythematosus ([place]) 10/02/2016  . Rheumatoid arthritis involving multiple sites with positive rheumatoid factor ([place]) 10/02/2016  . High risk medication use 10/02/2016  . History of chronic kidney disease 10/02/2016  . 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/2016  . Primary osteoarthritis of both knees 10/02/2016  . Vitamin D deficiency 10/02/2016  . History of diabetes mellitus 10/02/2016  . History of hypertension 10/02/2016  . History of asthma 10/02/2016  . Absolute anemia   . MGUS (monoclonal gammopathy of unknown significance)   . Normocytic anemia 03/21/2015  . Essential hypertension 03/20/2015  . Abnormal CT of the chest 07/30/2012  . Asthma 06/19/2012    Worthy Flank, SPT 03/21/18 4:34 PM   [place] [facility] [address] [place], [place], [address] Phone: [phone]   Fax:  [phone]  Name: Hannah [name] MRN: [id number] Date of Birth: [birth year]

## 2018-03-22 NOTE — Telephone Encounter (Signed)
Labs faxed to PCP

## 2018-03-23 ENCOUNTER — Ambulatory Visit: Payer: Medicare HMO | Admitting: Physical Therapy

## 2018-03-23 DIAGNOSIS — M25561 Pain in right knee: Secondary | ICD-10-CM | POA: Diagnosis not present

## 2018-03-23 DIAGNOSIS — R2689 Other abnormalities of gait and mobility: Secondary | ICD-10-CM

## 2018-03-23 DIAGNOSIS — M6281 Muscle weakness (generalized): Secondary | ICD-10-CM | POA: Diagnosis not present

## 2018-03-23 DIAGNOSIS — G8929 Other chronic pain: Secondary | ICD-10-CM | POA: Diagnosis not present

## 2018-03-23 DIAGNOSIS — M25562 Pain in left knee: Secondary | ICD-10-CM

## 2018-03-23 NOTE — Therapy (Signed)
[place], [place], [address] Phone: [phone]   Fax:  [phone]  Physical Therapy Treatment  Patient Details  Name: Hannah [name] MRN: [id number] Date of Birth: 01-Sep-1946 Referring Provider: Ofilia Neas, PA-C   Encounter Date: 03/23/2018  PT End of Session - 03/23/18 0801    Visit Number  9    Number of Visits  13    Date for PT Re-Evaluation  04/06/18    PT Start Time  0800    PT Stop Time  0846    PT Time Calculation (min)  46 min    Activity Tolerance  Patient tolerated treatment well;Patient limited by pain    Behavior During Therapy  [facility] for tasks assessed/performed       Past Medical History:  Diagnosis Date  . Arthritis   . Asthma   . Diabetes mellitus without complication ([place])   . Gout 12/17/2014   patient reported  . Hyperlipidemia   . Hypertension     Past Surgical History:  Procedure Laterality Date  . CATARACT EXTRACTION Bilateral 2015  . DILATION AND CURETTAGE OF UTERUS      There were no vitals filed for this visit.  Subjective Assessment - 03/23/18 0803    Subjective  "I am doing okay, I am still having stiffness when I bend my knee. It is only with certain movements."    Currently in Pain?  Yes    Pain Score  5     Pain Location  Knee    Pain Orientation  Posterior;Right;Left;Lateral    Pain Descriptors / Indicators  Aching stiffness    Pain Onset  More than a month ago         [facility] PT Assessment - 03/23/18 0001      Observation/Other Assessments   Focus on Therapeutic Outcomes (FOTO)   54% limited predicted 42% limited                   [facility] Adult PT Treatment/Exercise - 03/23/18 0814      Self-Care   Self-Care  Other Self-Care Comments    Other Self-Care Comments   muscle roller on B hamstrings and L peroneal      Therapeutic Activites    Other Therapeutic Activities  ascending and descending 4 steps x 5. Pt instructed to stand  straight and tuck bottom pt reports decreased pain on last set with taping      Knee/Hip Exercises: Stretches   Passive Hamstring Stretch  Both;30 seconds;4 reps before and after muscle rolling    Gastroc Stretch  Both;2 reps;30 seconds      Knee/Hip Exercises: Aerobic   Nustep  L4x 5 min BUE and BLE      Knee/Hip Exercises: Seated   Long Arc Quad  10 reps;Both;Strengthening;1 set focus on eccentric control      Manual Therapy   Manual Therapy  Taping    McConnell  L arch taping. Pt reports decreased pain during ambulation and stairs             PT Education - 03/23/18 0854    Education provided  Yes    Education Details  HEP, tiger tail muscle roller, McConnell arch taping and benefits for knee positioning    Person(s) Educated  Patient    Methods  Explanation;Handout    Comprehension  Verbalized understanding       PT Short Term Goals - 02/23/18 3009  PT SHORT TERM GOAL #1   Title  pt to be i with inital HEP     Time  3    Period  Weeks    Status  New    Target Date  03/16/18      PT SHORT TERM GOAL #2   Title  pt to demo techniques to prevent/ reduce pain and inflammation via rice/ HEP    Time  3    Period  Weeks    Status  New    Target Date  04/06/18        PT Long Term Goals - 02/23/18 0937      PT LONG TERM GOAL #1   Title  increase bil knee extension to </= 3 degrees and >/= 115 degrees to promote functional and efficient gait pattern with </= 1/10 pain     Time  6    Period  Weeks    Status  New    Target Date  04/06/18      PT LONG TERM GOAL #2   Title  increase bil LE strenght to >/= 4+/5 to promote stability and safety with walking and navigate steps     Time  6    Period  Weeks    Status  New    Target Date  04/06/18      PT LONG TERM GOAL #3   Title  pt to be able to walk and navigate >/= 12 steps reciprocally with </= 1/10 pain with no report of feeling like her knee is going to buckle     Time  6    Period  Weeks    Status   New    Target Date  04/06/18      PT LONG TERM GOAL #4   Title  increase FOTO score to </= 42% limited to demo improvement in function     Time  6    Period  Weeks    Status  New    Target Date  04/06/18      PT LONG TERM GOAL #5   Title  pt to be I with all HEP given as of last visit to maintain and progress current level of function     Time  6    Period  Weeks    Status  New    Target Date  04/06/18            Plan - 03/23/18 0856    Clinical Impression Statement  Treatment today focused on stretching of gastroc and hamstrings and self-rolling hamstrings and L peroneal. Pt reporting pain in anterior aspect of knee when ascending and descending steps, therefore focused on eccentric quad control during LAQs. Noted that pt has arch collapse during ambulation. Applied McConnell arch taping to L foot; pt reports decreased L knee pain during ambulation and stairs. Pt reports locking in posterolateral aspect of L knee and pain with deep knee flexion (when she ascends stairs).     PT Treatment/Interventions  ADLs/Self Care Home Management;Cryotherapy;Electrical Stimulation;Iontophoresis 4mg /ml Dexamethasone;Moist Heat;Taping;Therapeutic activities;Therapeutic exercise;Manual techniques;Dry needling;Patient/family education;Passive range of motion;Ultrasound;Gait training;Stair training;Balance training    PT Next Visit Plan  update HEP, ask about McConnell arch taping, IASTM prn, practice stairs, eccentric quad and glute control, meniscus testing    PT Home Exercise Plan  Sidelying hip abduction, bridges, SLR, hamstring stretch, sit-stand from elevated seat, standing hip abduction and extension; hip ER ex with theraband, heel raises with inversion    Consulted  and Agree with Plan of Care  Patient       Patient will benefit from skilled therapeutic intervention in order to improve the following deficits and impairments:  Abnormal gait, Pain, Decreased range of motion, Postural dysfunction,  Improper body mechanics, Decreased knowledge of precautions, Decreased strength, Decreased activity tolerance, Decreased endurance  Visit Diagnosis: Chronic pain of right knee  Chronic pain of left knee  Muscle weakness (generalized)  Other abnormalities of gait and mobility     Problem List Patient Active Problem List   Diagnosis Date Noted  . Iron deficiency anemia 08/12/2017  . Osteoporosis 10/02/2016  . Systemic lupus erythematosus ([place]) 10/02/2016  . Rheumatoid arthritis involving multiple sites with positive rheumatoid factor ([place]) 10/02/2016  . High risk medication use 10/02/2016  . History of chronic kidney disease 10/02/2016  . 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/2016  . Primary osteoarthritis of both knees 10/02/2016  . Vitamin D deficiency 10/02/2016  . History of diabetes mellitus 10/02/2016  . History of hypertension 10/02/2016  . History of asthma 10/02/2016  . Absolute anemia   . MGUS (monoclonal gammopathy of unknown significance)   . Normocytic anemia 03/21/2015  . Essential hypertension 03/20/2015  . Abnormal CT of the chest 07/30/2012  . Asthma 06/19/2012    Karleen Dolphin 03/23/2018, 9:05 AM  [facility] [address] [place], [place], [address] Phone: [phone]   Fax:  [phone]  Name: Hannah [name] MRN: [id number] Date of Birth: 1945-11-19

## 2018-03-28 ENCOUNTER — Telehealth: Payer: Self-pay | Admitting: Rheumatology

## 2018-03-28 NOTE — Telephone Encounter (Signed)
Patient called stating she received a call from [facility] telling her that she needed to contact Dr. Estanislado Pandy.  Patient was told they were not able to refill her prescription of Prolia.

## 2018-03-29 NOTE — Telephone Encounter (Signed)
Patient advised that we are working on a prior authorization for Prolia. Will update patient as soon as we know something.

## 2018-04-05 ENCOUNTER — Ambulatory Visit: Payer: Medicare HMO | Attending: Physician Assistant | Admitting: Physical Therapy

## 2018-04-05 ENCOUNTER — Encounter: Payer: Self-pay | Admitting: Physical Therapy

## 2018-04-05 DIAGNOSIS — G8929 Other chronic pain: Secondary | ICD-10-CM

## 2018-04-05 DIAGNOSIS — M25561 Pain in right knee: Secondary | ICD-10-CM | POA: Diagnosis not present

## 2018-04-05 DIAGNOSIS — M25562 Pain in left knee: Secondary | ICD-10-CM | POA: Diagnosis not present

## 2018-04-05 DIAGNOSIS — R2689 Other abnormalities of gait and mobility: Secondary | ICD-10-CM | POA: Diagnosis not present

## 2018-04-05 DIAGNOSIS — M6281 Muscle weakness (generalized): Secondary | ICD-10-CM | POA: Insufficient documentation

## 2018-04-05 NOTE — Therapy (Signed)
[place], [place], [address] Phone: [phone]   Fax:  [phone]  Physical Therapy Treatment  Patient Details  Name: Hannah [name] MRN: [id number] Date of Birth: 03-Mar-1946 Referring Provider: Ofilia Neas, PA-C   Encounter Date: 04/05/2018  PT End of Session - 04/05/18 1336    Visit Number  10    Number of Visits  13    Date for PT Re-Evaluation  04/19/18    PT Start Time  0130    PT Stop Time  0215    PT Time Calculation (min)  45 min    Activity Tolerance  Patient tolerated treatment well;Patient limited by pain    Behavior During Therapy  [facility] for tasks assessed/performed       Past Medical History:  Diagnosis Date  . Arthritis   . Asthma   . Diabetes mellitus without complication ([place])   . Gout 12/17/2014   patient reported  . Hyperlipidemia   . Hypertension     Past Surgical History:  Procedure Laterality Date  . CATARACT EXTRACTION Bilateral 2015  . DILATION AND CURETTAGE OF UTERUS      There were no vitals filed for this visit.  Subjective Assessment - 04/05/18 1333    Subjective  Had a rough week last week. Did not have any PT appts. Did my exercises and went to my exercises classes.     Currently in Pain?  Yes    Pain Score  5     Pain Location  Knee    Pain Orientation  Posterior;Left;Right    Pain Descriptors / Indicators  Sharp    Pain Type  Chronic pain    Pain Frequency  Intermittent    Aggravating Factors   sit-stand, stairs, Nustep with resistance    Pain Relieving Factors  heat, massage, stretch          [facility] PT Assessment - 04/05/18 0001      AROM   Right Knee Extension  7    Right Knee Flexion  118    Left Knee Extension  5    Left Knee Flexion  110      Strength   Right Hip Flexion  4-/5    Right Hip ABduction  4+/5    Left Hip Flexion  4-/5    Left Hip ABduction  4+/5    Right Knee Flexion  4/5 pain posterior    Right Knee Extension  4+/5     Left Knee Flexion  4+/5    Left Knee Extension  4+/5                   [facility] Adult PT Treatment/Exercise - 04/05/18 0001      Knee/Hip Exercises: Stretches   Passive Hamstring Stretch  Both;30 seconds;3 reps    Gastroc Stretch  Both;2 reps;30 seconds      Knee/Hip Exercises: Aerobic   Nustep  L4x 6 min BUE and BLE, 500 steps      Knee/Hip Exercises: Standing   Forward Step Up  5 reps;Hand Hold: 1;2 sets;Step Height: 6"    SLS  12 stairs 1 hand rail up, 2 hand rails down       Knee/Hip Exercises: Supine   Quad Sets  10 reps;Right;Left;2 sets heel prop    Bridges  20 reps    Straight Leg Raises  20 reps;Left;Both      Manual Therapy   McConnell  bilat arch tape  PT Short Term Goals - 04/05/18 1348      PT SHORT TERM GOAL #1   Title  pt to be i with inital HEP     Time  3    Period  Weeks    Status  Achieved      PT SHORT TERM GOAL #2   Title  pt to demo techniques to prevent/ reduce pain and inflammation via rice/ HEP    Baseline  using massage, rest, ice as needed    Time  3    Period  Weeks    Status  Achieved        PT Long Term Goals - 04/05/18 1416      PT LONG TERM GOAL #1   Title  increase bil knee extension to </= 3 degrees and >/= 115 degrees to promote functional and efficient gait pattern with </= 1/10 pain     Baseline  Right knee 118 flexion    Time  6    Period  Weeks    Status  On-going      PT LONG TERM GOAL #2   Title  increase bil LE strenght to >/= 4+/5 to promote stability and safety with walking and navigate steps     Baseline  hip abduction and bil knees 4+/5, except right hamstring 4/5    Time  6    Period  Weeks    Status  Partially Met      PT LONG TERM GOAL #3   Title  pt to be able to walk and navigate >/= 12 steps reciprocally with </= 1/10 pain with no report of feeling like her knee is going to buckle     Baseline  difficulty descending reciprocally     Time  6    Period  Weeks     Status  On-going      PT LONG TERM GOAL #4   Title  increase FOTO score to </= 42% limited to demo improvement in function     Baseline  03/23/2018 -54% limited     Time  6    Period  Weeks    Status  On-going      PT LONG TERM GOAL #5   Title  pt to be I with all HEP given as of last visit to maintain and progress current level of function     Baseline  independent thus far     Time  6    Period  Weeks    Status  On-going            Plan - 04/05/18 1419    Clinical Impression Statement  Pt reports arch tape helpul. She brought her arch supports which are 73 years old. In standing the arch suports collapse, so recommended new ones. She requested bilateral arch tape today and is interested in learning self aplication. Her hip and knee strength have improved as well as knee flexion ROM. She continues to lack bilateral knee extension R>L. She is able to negotiate reciprocal stairs with more difficulty, hesitation and use of UE on descent. She has met STG# 1, #2 and partially met LTG# 2. She will benefit from continued PT to address the deficits listed.     PT Frequency  2x / week    PT Duration  2 weeks    PT Treatment/Interventions  ADLs/Self Care Home Management;Cryotherapy;Electrical Stimulation;Iontophoresis 33m/ml Dexamethasone;Moist Heat;Taping;Therapeutic activities;Therapeutic exercise;Manual techniques;Dry needling;Patient/family education;Passive range of motion;Ultrasound;Gait training;Stair training;Balance training  PT Next Visit Plan  try painfree hamstring strength on right; continue step ups/ sit-stands; update HEP, ask about McConnell arch taping-does she want to learn? , IASTM prn, practice stairs, eccentric quad and glute control, meniscus testing    PT Home Exercise Plan  Sidelying hip abduction, bridges, SLR, hamstring stretch, sit-stand from elevated seat, standing hip abduction and extension; hip ER ex with theraband, heel raises with inversion    Consulted and Agree  with Plan of Care  Patient       Patient will benefit from skilled therapeutic intervention in order to improve the following deficits and impairments:  Abnormal gait, Pain, Decreased range of motion, Postural dysfunction, Improper body mechanics, Decreased knowledge of precautions, Decreased strength, Decreased activity tolerance, Decreased endurance  Visit Diagnosis: Chronic pain of right knee  Chronic pain of left knee  Muscle weakness (generalized)  Other abnormalities of gait and mobility     Problem List Patient Active Problem List   Diagnosis Date Noted  . Iron deficiency anemia 08/12/2017  . Osteoporosis 10/02/2016  . Systemic lupus erythematosus ([place]) 10/02/2016  . Rheumatoid arthritis involving multiple sites with positive rheumatoid factor ([place]) 10/02/2016  . High risk medication use 10/02/2016  . History of chronic kidney disease 10/02/2016  . 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/2016  . Primary osteoarthritis of both knees 10/02/2016  . Vitamin D deficiency 10/02/2016  . History of diabetes mellitus 10/02/2016  . History of hypertension 10/02/2016  . History of asthma 10/02/2016  . Absolute anemia   . MGUS (monoclonal gammopathy of unknown significance)   . Normocytic anemia 03/21/2015  . Essential hypertension 03/20/2015  . Abnormal CT of the chest 07/30/2012  . Asthma 06/19/2012    Dorene Ar, PTA 04/05/2018, 2:28 PM  [facility] [address] [place], [place], [address] Phone: [phone]   Fax:  [phone]  Name: Hannah [name] MRN: [id number] Date of Birth: [birth year]

## 2018-04-08 ENCOUNTER — Encounter: Payer: Self-pay | Admitting: Physical Therapy

## 2018-04-08 ENCOUNTER — Ambulatory Visit: Payer: Medicare HMO | Admitting: Physical Therapy

## 2018-04-08 DIAGNOSIS — G8929 Other chronic pain: Secondary | ICD-10-CM

## 2018-04-08 DIAGNOSIS — R2689 Other abnormalities of gait and mobility: Secondary | ICD-10-CM

## 2018-04-08 DIAGNOSIS — M6281 Muscle weakness (generalized): Secondary | ICD-10-CM | POA: Diagnosis not present

## 2018-04-08 DIAGNOSIS — M25561 Pain in right knee: Principal | ICD-10-CM

## 2018-04-08 DIAGNOSIS — M25562 Pain in left knee: Secondary | ICD-10-CM | POA: Diagnosis not present

## 2018-04-08 NOTE — Telephone Encounter (Signed)
Prior Authorization has been submitted via phone to [facility].Spoke to [employer]. Estimated 72 hour turnaround.   Reference # [id number] Phone# [phone]  10:38 AM [name], CPhT

## 2018-04-08 NOTE — Patient Instructions (Signed)
Knee Extension: Resisted (Sitting)  Red band  With band looped around right ankle and under other foot, straighten leg with ankle loop. Keep other leg bent to increase resistance. Repeat __10__ times per set. Do ___2_ sets per session. Do __2__ sessions per day.  http://orth.exer.us/690   CKnee Flexion: Resisted (Sitting)  Green band Sit with band under left foot and looped around ankle of supported leg. Pull unsupported leg back. Repeat _10___ times per set. Do ___2_ sets per session. Do __2__ sessions per day.  http://orth.exer.us/695   Copyright  VHI. All rights reserved.

## 2018-04-08 NOTE — Therapy (Signed)
[place], [place], [address] Phone: [phone]   Fax:  [phone]  Physical Therapy Treatment  Patient Details  Name: Hannah [name] MRN: [id number] Date of Birth: [birth year] Referring Provider: Ofilia Neas, PA-C   Encounter Date: 04/08/2018  PT End of Session - 04/08/18 1102    Visit Number  11    Number of Visits  13    Date for PT Re-Evaluation  04/19/18    PT Start Time  1100    PT Stop Time  1140    PT Time Calculation (min)  40 min       Past Medical History:  Diagnosis Date  . Arthritis   . Asthma   . Diabetes mellitus without complication ([place])   . Gout 12/17/2014   patient reported  . Hyperlipidemia   . Hypertension     Past Surgical History:  Procedure Laterality Date  . CATARACT EXTRACTION Bilateral 2015  . DILATION AND CURETTAGE OF UTERUS      There were no vitals filed for this visit.  Subjective Assessment - 04/08/18 1100    Subjective  A little better. Pain is not constant; it's just when I make certain movements.     Currently in Pain?  Yes    Pain Score  5     Pain Location  Knee    Pain Orientation  Right;Left    Pain Descriptors / Indicators  Tightness                       [facility] Adult PT Treatment/Exercise - 04/08/18 0001      Self-Care   Other Self-Care Comments   --      Knee/Hip Exercises: Aerobic   Nustep  L4x 6 min BUE and BLE, 500 steps      Knee/Hip Exercises: Standing   Lateral Step Up  10 reps;Hand Hold: 1;Left;Both;Step Height: 4"    Lateral Step Up Limitations  cues to decrease hip compensations, difficult with pain right > left     Forward Step Up  10 reps;Step Height: 6";Hand Hold: 1;Left;Both      Knee/Hip Exercises: Seated   Long Arc Quad  10 reps;2 sets;Left;Right red     Hamstring Curl  10 reps;2 sets;Right;Left red      Knee/Hip Exercises: Supine   Quad Sets  10 reps;Right;Left;2 sets heel prop    Bridges with  Ball Squeeze  20 reps    Straight Leg Raises  20 reps;Left;Both      Manual Therapy   McConnell  bilat arch tape              PT Education - 04/08/18 1122    Education provided  Yes    Education Details  HEP , info to purchase Leuko tape and coverroll for arch support, okay to try Dr Zoe Lan inserts.     Person(s) Educated  Patient    Methods  Explanation;Handout    Comprehension  Verbalized understanding       PT Short Term Goals - 04/05/18 1348      PT SHORT TERM GOAL #1   Title  pt to be i with inital HEP     Time  3    Period  Weeks    Status  Achieved      PT SHORT TERM GOAL #2   Title  pt to demo techniques to prevent/ reduce pain and inflammation via rice/ HEP  Baseline  using massage, rest, ice as needed    Time  3    Period  Weeks    Status  Achieved        PT Long Term Goals - 04/05/18 1416      PT LONG TERM GOAL #1   Title  increase bil knee extension to </= 3 degrees and >/= 115 degrees to promote functional and efficient gait pattern with </= 1/10 pain     Baseline  Right knee 118 flexion    Time  6    Period  Weeks    Status  On-going      PT LONG TERM GOAL #2   Title  increase bil LE strenght to >/= 4+/5 to promote stability and safety with walking and navigate steps     Baseline  hip abduction and bil knees 4+/5, except right hamstring 4/5    Time  6    Period  Weeks    Status  Partially Met      PT LONG TERM GOAL #3   Title  pt to be able to walk and navigate >/= 12 steps reciprocally with </= 1/10 pain with no report of feeling like her knee is going to buckle     Baseline  difficulty descending reciprocally     Time  6    Period  Weeks    Status  On-going      PT LONG TERM GOAL #4   Title  increase FOTO score to </= 42% limited to demo improvement in function     Baseline  03/23/2018 -54% limited     Time  6    Period  Weeks    Status  On-going      PT LONG TERM GOAL #5   Title  pt to be I with all HEP given as of last visit  to maintain and progress current level of function     Baseline  independent thus far     Time  6    Period  Weeks    Status  On-going            Plan - 04/08/18 1123    Clinical Impression Statement  Pt feels arch tape is helpful however reports she is overall about the same. Repeated tape per her request and gave her the names of tape to purchase. Worked on step ups with increased lateral knee pain and  difficulty on right > left. Began seated knee strength with theraband resistance and updated HEP.     PT Next Visit Plan  try painfree hamstring strength on right; continue step ups/ sit-stands; update HEP, ask about McConnell arch taping-does she want to learn? , IASTM prn, practice stairs, eccentric quad and glute control, meniscus testing    PT Home Exercise Plan  Sidelying hip abduction, bridges, SLR, hamstring stretch, sit-stand from elevated seat, standing hip abduction and extension; hip ER ex with theraband, heel raises with inversion, Seated knee theraband extension red, flexion green     Consulted and Agree with Plan of Care  Patient       Patient will benefit from skilled therapeutic intervention in order to improve the following deficits and impairments:  Abnormal gait, Pain, Decreased range of motion, Postural dysfunction, Improper body mechanics, Decreased knowledge of precautions, Decreased strength, Decreased activity tolerance, Decreased endurance  Visit Diagnosis: Chronic pain of right knee  Chronic pain of left knee  Muscle weakness (generalized)  Other abnormalities of gait and mobility  Problem List Patient Active Problem List   Diagnosis Date Noted  . Iron deficiency anemia 08/12/2017  . Osteoporosis 10/02/2016  . Systemic lupus erythematosus ([place]) 10/02/2016  . Rheumatoid arthritis involving multiple sites with positive rheumatoid factor ([place]) 10/02/2016  . High risk medication use 10/02/2016  . History of chronic kidney disease 10/02/2016  .  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/2016  . Primary osteoarthritis of both knees 10/02/2016  . Vitamin D deficiency 10/02/2016  . History of diabetes mellitus 10/02/2016  . History of hypertension 10/02/2016  . History of asthma 10/02/2016  . Absolute anemia   . MGUS (monoclonal gammopathy of unknown significance)   . Normocytic anemia 03/21/2015  . Essential hypertension 03/20/2015  . Abnormal CT of the chest 07/30/2012  . Asthma 06/19/2012    Dorene Ar, PTA 04/08/2018, 11:44 AM  [facility] [address] [place], [place], [address] Phone: [phone]   Fax:  [phone]  Name: Hannah [name] MRN: [id number] Date of Birth: [birth year]

## 2018-04-08 NOTE — Telephone Encounter (Signed)
Prior Authorization has been submitted via phone to [facility].Spoke to [employer]. Estimated 72 hour turnaround.   Reference # [id number] Phone# [phone]  10:38 AM [name], CPhT

## 2018-04-11 ENCOUNTER — Ambulatory Visit: Payer: Medicare HMO | Admitting: Physical Therapy

## 2018-04-11 DIAGNOSIS — M6281 Muscle weakness (generalized): Secondary | ICD-10-CM

## 2018-04-11 DIAGNOSIS — R2689 Other abnormalities of gait and mobility: Secondary | ICD-10-CM | POA: Diagnosis not present

## 2018-04-11 DIAGNOSIS — G8929 Other chronic pain: Secondary | ICD-10-CM | POA: Diagnosis not present

## 2018-04-11 DIAGNOSIS — M25561 Pain in right knee: Principal | ICD-10-CM

## 2018-04-11 DIAGNOSIS — M25562 Pain in left knee: Secondary | ICD-10-CM | POA: Diagnosis not present

## 2018-04-11 NOTE — Therapy (Addendum)
[place], [place], [address] Phone: [phone]   Fax:  [phone]  Physical Therapy Treatment/Re-certification  Patient Details  Name: Hannah [name] MRN: [id number] Date of Birth: 09/09/1946 Referring Provider: Ofilia Neas, PA-C   Encounter Date: 04/11/2018  PT End of Session - 04/11/18 1423    Visit Number  12    Number of Visits  17    Date for PT Re-Evaluation  05/09/18    PT Start Time  0130    PT Stop Time  0218    PT Time Calculation (min)  48 min    Activity Tolerance  Patient tolerated treatment well    Behavior During Therapy  [facility] for tasks assessed/performed       Past Medical History:  Diagnosis Date  . Arthritis   . Asthma   . Diabetes mellitus without complication ([place])   . Gout 12/17/2014   patient reported  . Hyperlipidemia   . Hypertension     Past Surgical History:  Procedure Laterality Date  . CATARACT EXTRACTION Bilateral 2015  . DILATION AND CURETTAGE OF UTERUS      There were no vitals filed for this visit.  Subjective Assessment - 04/11/18 1328    Subjective  "I am feeling about the same. The pain is only when I make certain movements."    Currently in Pain?  Yes    Pain Score  5     Pain Location  Knee    Pain Orientation  Right;Left    Pain Descriptors / Indicators  Aching    Pain Type  Chronic pain    Pain Onset  More than a month ago    Pain Frequency  Intermittent         [facility] PT Assessment - 04/11/18 0001      AROM   Right Knee Extension  8    Right Knee Flexion  115 painful    Left Knee Extension  4    Left Knee Flexion  110 painful      Strength   Right Hip Flexion  4+/5    Right Hip ABduction  4+/5    Left Hip Flexion  4+/5    Left Hip ABduction  4+/5    Right Knee Flexion  5/5    Right Knee Extension  4+/5    Left Knee Flexion  5/5    Left Knee Extension  4+/5      McMurray Test   Findings  Negative    Side  Right;Left     Comments  negative thesslays bilaterally                   [facility] Adult PT Treatment/Exercise - 04/11/18 0001      Knee/Hip Exercises: Stretches   Hip Flexor Stretch  1 rep x 1-1.5 min. muscle rolling while stretching    Gastroc Stretch  Both;2 reps;30 seconds      Knee/Hip Exercises: Aerobic   Nustep  L4x 5 min BLE only      Manual Therapy   Manual Therapy  Soft tissue mobilization    Soft tissue mobilization  IASTM to R hamstring. Muscle rolling of R hip flexors and quadriceps               PT Short Term Goals - 04/05/18 1348      PT SHORT TERM GOAL #1   Title  pt to be i with inital HEP  Time  3    Period  Weeks    Status  Achieved      PT SHORT TERM GOAL #2   Title  pt to demo techniques to prevent/ reduce pain and inflammation via rice/ HEP    Baseline  using massage, rest, ice as needed    Time  3    Period  Weeks    Status  Achieved        PT Long Term Goals - 04/05/18 1416      PT LONG TERM GOAL #1   Title  increase bil knee extension to </= 3 degrees and >/= 115 degrees to promote functional and efficient gait pattern with </= 1/10 pain     Baseline  Right knee 118 flexion    Time  6    Period  Weeks    Status  On-going      PT LONG TERM GOAL #2   Title  increase bil LE strenght to >/= 4+/5 to promote stability and safety with walking and navigate steps     Baseline  hip abduction and bil knees 4+/5, except right hamstring 4/5    Time  6    Period  Weeks    Status  Partially Met      PT LONG TERM GOAL #3   Title  pt to be able to walk and navigate >/= 12 steps reciprocally with </= 1/10 pain with no report of feeling like her knee is going to buckle     Baseline  difficulty descending reciprocally     Time  6    Period  Weeks    Status  On-going      PT LONG TERM GOAL #4   Title  increase FOTO score to </= 42% limited to demo improvement in function     Baseline  03/23/2018 -54% limited     Time  6    Period  Weeks     Status  On-going      PT LONG TERM GOAL #5   Title  pt to be I with all HEP given as of last visit to maintain and progress current level of function     Baseline  independent thus far     Time  6    Period  Weeks    Status  On-going            Plan - 04/11/18 1424    Clinical Impression Statement  Reassessment performed today. Pt demonstrates some increase in strengh in hips and knees and ROM in knees. She reports she always feels better after leaving her PT sessions but the pain comes back. Treatment today focused on stretching and IASTM of the R biceps femoris tendon and muscle rolling to the R hip flexors in a stretched position. Pt reports no pain at the end of session. Discussed POC and decided to decrease to 1x/wk for 4 wks to address functional mobility and HEP. Pt would benefit from further OPPT services to address pain, strength, ROM, functional mobility, and finalizing HEP.     Rehab Potential  Good    PT Frequency  1x / week    PT Duration  4 weeks    PT Treatment/Interventions  ADLs/Self Care Home Management;Cryotherapy;Electrical Stimulation;Iontophoresis 39m/ml Dexamethasone;Moist Heat;Taping;Therapeutic activities;Therapeutic exercise;Manual techniques;Dry needling;Patient/family education;Passive range of motion;Ultrasound;Gait training;Stair training;Balance training    PT Next Visit Plan  eccentric hamstring exercise to promote remodeling of tendon, quad and hip strengthening, try painfree hamstring strength  on right; continue step ups/ sit-stands; update HEP, ask about McConnell arch taping-does she want to learn? , IASTM prn, practice stairs,     PT Home Exercise Plan  Sidelying hip abduction, bridges, SLR, hamstring stretch, sit-stand from elevated seat, standing hip abduction and extension; hip ER ex with theraband, heel raises with inversion, Seated knee theraband extension red, flexion green     Consulted and Agree with Plan of Care  Patient       Patient will  benefit from skilled therapeutic intervention in order to improve the following deficits and impairments:  Abnormal gait, Pain, Decreased range of motion, Postural dysfunction, Improper body mechanics, Decreased knowledge of precautions, Decreased strength, Decreased activity tolerance, Decreased endurance  Visit Diagnosis: Chronic pain of right knee  Chronic pain of left knee  Muscle weakness (generalized)  Other abnormalities of gait and mobility     Problem List Patient Active Problem List   Diagnosis Date Noted  . Iron deficiency anemia 08/12/2017  . Osteoporosis 10/02/2016  . Systemic lupus erythematosus ([place]) 10/02/2016  . Rheumatoid arthritis involving multiple sites with positive rheumatoid factor ([place]) 10/02/2016  . High risk medication use 10/02/2016  . History of chronic kidney disease 10/02/2016  . 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/2016  . Primary osteoarthritis of both knees 10/02/2016  . Vitamin D deficiency 10/02/2016  . History of diabetes mellitus 10/02/2016  . History of hypertension 10/02/2016  . History of asthma 10/02/2016  . Absolute anemia   . MGUS (monoclonal gammopathy of unknown significance)   . Normocytic anemia 03/21/2015  . Essential hypertension 03/20/2015  . Abnormal CT of the chest 07/30/2012  . Asthma 06/19/2012    Worthy Flank, SPT 04/11/18 2:39 PM   [place] [facility] [address] [place], [place], [address] Phone: [phone]   Fax:  [phone]  Name: Hannah [name] MRN: [id number] Date of Birth: 1945-11-06

## 2018-04-12 ENCOUNTER — Telehealth: Payer: Self-pay | Admitting: Rheumatology

## 2018-04-12 NOTE — Telephone Encounter (Signed)
Patient called stating she received a call from her insurance company [employer] who told her that her Prolia would be covered through her Medicare part B plan.  Patient is requesting a return call.

## 2018-04-13 ENCOUNTER — Other Ambulatory Visit: Payer: Self-pay | Admitting: Rheumatology

## 2018-04-13 DIAGNOSIS — M069 Rheumatoid arthritis, unspecified: Secondary | ICD-10-CM

## 2018-04-13 NOTE — Telephone Encounter (Signed)
Last Visit: 02/03/18 Next visit: 07/06/18 Labs: 03/07/18 stable PLQ Eye Exam: 08/17/17 WNL  Okay to refill per Dr. Estanislado Pandy

## 2018-04-13 NOTE — Telephone Encounter (Signed)
Patient advised I am waiting on a response from the insurance company regarding the approval or denial of the Prolia. Patient advised will call the [employer].

## 2018-04-14 ENCOUNTER — Telehealth: Payer: Self-pay | Admitting: Rheumatology

## 2018-04-14 ENCOUNTER — Ambulatory Visit: Payer: Medicare HMO | Admitting: Physical Therapy

## 2018-04-14 DIAGNOSIS — M25561 Pain in right knee: Secondary | ICD-10-CM | POA: Diagnosis not present

## 2018-04-14 DIAGNOSIS — G8929 Other chronic pain: Secondary | ICD-10-CM

## 2018-04-14 DIAGNOSIS — R2689 Other abnormalities of gait and mobility: Secondary | ICD-10-CM | POA: Diagnosis not present

## 2018-04-14 DIAGNOSIS — M6281 Muscle weakness (generalized): Secondary | ICD-10-CM | POA: Diagnosis not present

## 2018-04-14 DIAGNOSIS — M25562 Pain in left knee: Secondary | ICD-10-CM

## 2018-04-14 NOTE — Therapy (Signed)
[place], [place], [address] Phone: [phone]   Fax:  [phone]  Physical Therapy Treatment  Patient Details  Name: Hannah [name] MRN: [id number] Date of Birth: 1945/12/31 Referring Provider: Ofilia Neas, PA-C   Encounter Date: 04/14/2018  PT End of Session - 04/14/18 1200    Visit Number  13    Number of Visits  17    Date for PT Re-Evaluation  05/09/18    PT Start Time  3235    PT Stop Time  1059    PT Time Calculation (min)  44 min    Activity Tolerance  Patient tolerated treatment well    Behavior During Therapy  [facility] for tasks assessed/performed       Past Medical History:  Diagnosis Date  . Arthritis   . Asthma   . Diabetes mellitus without complication ([place])   . Gout 12/17/2014   patient reported  . Hyperlipidemia   . Hypertension     Past Surgical History:  Procedure Laterality Date  . CATARACT EXTRACTION Bilateral 2015  . DILATION AND CURETTAGE OF UTERUS      There were no vitals filed for this visit.  Subjective Assessment - 04/14/18 1034    Subjective  "I feel good. Still only have the pain with certain movements like going up and down stairs."    Currently in Pain?  Yes    Pain Score  5     Pain Location  Knee    Pain Orientation  Right;Left    Pain Descriptors / Indicators  Aching    Pain Type  Chronic pain    Pain Frequency  Intermittent                       [facility] Adult PT Treatment/Exercise - 04/14/18 0001      Self-Care   Self-Care  -- inspected pts new shoe wear and inserts      Knee/Hip Exercises: Stretches   Passive Hamstring Stretch  2 reps;Both;30 seconds      Knee/Hip Exercises: Aerobic   Stationary Bike  x 5 min L1      Knee/Hip Exercises: Standing   Forward Step Up  15 reps;3 sets;Both;Hand Hold: 1 4 in step for first 2 sets, 6 in for last set    Stairs  4 6in steps x 5 sets pt reporting decreased pain       Knee/Hip  Exercises: Seated   Long Arc Quad  2 sets;Both;AROM;Strengthening;15 reps 4 lb progressing to 5 lb on second set    Other Seated Knee/Hip Exercises  seated ER with red theraband 2 x 10 bilaterally    Hamstring Curl  10 reps;2 sets red theraband             PT Education - 04/14/18 1159    Education provided  Yes    Education Details  HEP, shoe wear, stair training    Person(s) Educated  Patient    Methods  Explanation    Comprehension  Verbalized understanding       PT Short Term Goals - 04/05/18 1348      PT SHORT TERM GOAL #1   Title  pt to be i with inital HEP     Time  3    Period  Weeks    Status  Achieved      PT SHORT TERM GOAL #2   Title  pt to demo techniques to  prevent/ reduce pain and inflammation via rice/ HEP    Baseline  using massage, rest, ice as needed    Time  3    Period  Weeks    Status  Achieved        PT Long Term Goals - 04/05/18 1416      PT LONG TERM GOAL #1   Title  increase bil knee extension to </= 3 degrees and >/= 115 degrees to promote functional and efficient gait pattern with </= 1/10 pain     Baseline  Right knee 118 flexion    Time  6    Period  Weeks    Status  On-going      PT LONG TERM GOAL #2   Title  increase bil LE strenght to >/= 4+/5 to promote stability and safety with walking and navigate steps     Baseline  hip abduction and bil knees 4+/5, except right hamstring 4/5    Time  6    Period  Weeks    Status  Partially Met      PT LONG TERM GOAL #3   Title  pt to be able to walk and navigate >/= 12 steps reciprocally with </= 1/10 pain with no report of feeling like her knee is going to buckle     Baseline  difficulty descending reciprocally     Time  6    Period  Weeks    Status  On-going      PT LONG TERM GOAL #4   Title  increase FOTO score to </= 42% limited to demo improvement in function     Baseline  03/23/2018 -54% limited     Time  6    Period  Weeks    Status  On-going      PT LONG TERM GOAL #5    Title  pt to be I with all HEP given as of last visit to maintain and progress current level of function     Baseline  independent thus far     Time  6    Period  Weeks    Status  On-going            Plan - 04/14/18 1201    Clinical Impression Statement  Treatment focused on stretching, eccentric hamstring exercise, and knee strengthening. Also had pt perform forward step ups on 4in step. Pt reported no pain, therefore progressed to 6 in step. Pt able to complete multiple sets with no pain. Stair training performed; pt reports decreased pain as compared to previous stair training attempts.     PT Treatment/Interventions  ADLs/Self Care Home Management;Cryotherapy;Electrical Stimulation;Iontophoresis 57m/ml Dexamethasone;Moist Heat;Taping;Therapeutic activities;Therapeutic exercise;Manual techniques;Dry needling;Patient/family education;Passive range of motion;Ultrasound;Gait training;Stair training;Balance training    PT Next Visit Plan  eccentric hamstring exercise to promote remodeling of tendon, quad and hip strengthening, try painfree hamstring strength on right; continue step ups/ sit-stands; update HEP, ask about McConnell arch taping-does she want to learn? , IASTM prn, practice stairs, step ups on 6 in and 8 in step (if tolerable)     PT Home Exercise Plan  Sidelying hip abduction, bridges, SLR, hamstring stretch, sit-stand from elevated seat, standing hip abduction and extension; hip ER ex with theraband, heel raises with inversion, Seated knee theraband extension red, flexion green     Consulted and Agree with Plan of Care  Patient       Patient will benefit from skilled therapeutic intervention in order to improve the following  deficits and impairments:  Abnormal gait, Pain, Decreased range of motion, Postural dysfunction, Improper body mechanics, Decreased knowledge of precautions, Decreased strength, Decreased activity tolerance, Decreased endurance  Visit Diagnosis: Chronic  pain of right knee  Chronic pain of left knee  Muscle weakness (generalized)  Other abnormalities of gait and mobility     Problem List Patient Active Problem List   Diagnosis Date Noted  . Iron deficiency anemia 08/12/2017  . Osteoporosis 10/02/2016  . Systemic lupus erythematosus ([place]) 10/02/2016  . Rheumatoid arthritis involving multiple sites with positive rheumatoid factor ([place]) 10/02/2016  . High risk medication use 10/02/2016  . History of chronic kidney disease 10/02/2016  . 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/2016  . Primary osteoarthritis of both knees 10/02/2016  . Vitamin D deficiency 10/02/2016  . History of diabetes mellitus 10/02/2016  . History of hypertension 10/02/2016  . History of asthma 10/02/2016  . Absolute anemia   . MGUS (monoclonal gammopathy of unknown significance)   . Normocytic anemia 03/21/2015  . Essential hypertension 03/20/2015  . Abnormal CT of the chest 07/30/2012  . Asthma 06/19/2012   Worthy Flank, SPT 04/14/18 12:06 PM   [place] [facility] [address] [place], [place], [address] Phone: [phone]   Fax:  [phone]  Name: ZYRAH WISWELL MRN: [id number] Date of Birth: Feb 02, 1946

## 2018-04-14 NOTE — Telephone Encounter (Signed)
Patient left a voicemail regarding her Prolia shot which has not been approved through her [employer].  Patient states she spoke with a representative from [facility] and was told they would start an appeal.  Patient is requesting a return call after 3:00 pm today.

## 2018-04-14 NOTE — Telephone Encounter (Signed)
Contact [employer] appeal process has been started. Decision process should be 72 hours. Patient will be advised when we receive the decision.

## 2018-04-20 ENCOUNTER — Telehealth: Payer: Self-pay | Admitting: Rheumatology

## 2018-04-20 NOTE — Telephone Encounter (Signed)
Appeal has been approved. Patient's copay has increased to $257. Attempted to contact the patient and left message for patient to call the office.

## 2018-04-20 NOTE — Telephone Encounter (Signed)
Patient states her copay is not affordable. Can we look into patient adssiastance for her?

## 2018-04-20 NOTE — Telephone Encounter (Signed)
Patient called stating she was returning your call.   °

## 2018-04-20 NOTE — Telephone Encounter (Signed)
Please see previous phone note.  

## 2018-04-21 ENCOUNTER — Ambulatory Visit: Payer: Medicare HMO | Admitting: Physical Therapy

## 2018-04-21 ENCOUNTER — Encounter: Payer: Self-pay | Admitting: Physical Therapy

## 2018-04-21 DIAGNOSIS — G8929 Other chronic pain: Secondary | ICD-10-CM | POA: Diagnosis not present

## 2018-04-21 DIAGNOSIS — M25562 Pain in left knee: Secondary | ICD-10-CM

## 2018-04-21 DIAGNOSIS — M25561 Pain in right knee: Secondary | ICD-10-CM | POA: Diagnosis not present

## 2018-04-21 DIAGNOSIS — M6281 Muscle weakness (generalized): Secondary | ICD-10-CM | POA: Diagnosis not present

## 2018-04-21 DIAGNOSIS — R2689 Other abnormalities of gait and mobility: Secondary | ICD-10-CM

## 2018-04-21 MED FILL — PROLIA 60 MG/ML SOLN: 60 | 180 days supply | Qty: 1 | Fill #0

## 2018-04-21 NOTE — Therapy (Signed)
[place], [place], [address] Phone: [phone]   Fax:  [phone]  Physical Therapy Treatment  Patient Details  Name: Hannah [name] MRN: [id number] Date of Birth: [birth year] Referring Provider: Ofilia Neas, PA-C   Encounter Date: 04/21/2018  PT End of Session - 04/21/18 0737    Visit Number  14    Number of Visits  17    Date for PT Re-Evaluation  05/09/18    PT Start Time  0731    PT Stop Time  0810    PT Time Calculation (min)  39 min       Past Medical History:  Diagnosis Date  . Arthritis   . Asthma   . Diabetes mellitus without complication ([place])   . Gout 12/17/2014   patient reported  . Hyperlipidemia   . Hypertension     Past Surgical History:  Procedure Laterality Date  . CATARACT EXTRACTION Bilateral 2015  . DILATION AND CURETTAGE OF UTERUS      There were no vitals filed for this visit.  Subjective Assessment - 04/21/18 0735    Subjective  Early and a little cool this moring so my knees are a little stiff and achey.     Currently in Pain?  Yes    Pain Score  5     Pain Location  Knee    Pain Orientation  Left;Right    Aggravating Factors   nustep with resistance, sometimes walking, cha cha dance, holding weights with sit-stands during exercise class    Pain Relieving Factors  heat, massage, stretch                        [facility] Adult PT Treatment/Exercise - 04/21/18 0001      Knee/Hip Exercises: Stretches   Active Hamstring Stretch  3 reps;30 seconds seated    Other Knee/Hip Stretches  slant board stretch x 1 minute       Knee/Hip Exercises: Aerobic   Nustep  L4x 5 min BLE only      Knee/Hip Exercises: Standing   Lateral Step Up  1 set;10 reps;Step Height: 4";Hand Hold: 1;Right;Left    Forward Step Up  1 set;Step Height: 4";Hand Hold: 1;Left;Right    Step Down  1 set;Step Height: 4";Hand Hold: 2;10 reps;Left;Right    Stairs  up and down 6  incha stairs, needs bil UE support, increased time for descent.       Knee/Hip Exercises: Seated   Long Arc Quad  2 sets;Both;AROM;Strengthening;15 reps 4 lb progressing to 5 lb on second set    Other Seated Knee/Hip Exercises  seated ER with red theraband 2 x 10 bilaterally    Hamstring Curl  10 reps;2 sets red theraband    Sit to Sand  10 reps;without UE support               PT Short Term Goals - 04/05/18 1348      PT SHORT TERM GOAL #1   Title  pt to be i with inital HEP     Time  3    Period  Weeks    Status  Achieved      PT SHORT TERM GOAL #2   Title  pt to demo techniques to prevent/ reduce pain and inflammation via rice/ HEP    Baseline  using massage, rest, ice as needed    Time  3    Period  Weeks  Status  Achieved        PT Long Term Goals - 04/05/18 1416      PT LONG TERM GOAL #1   Title  increase bil knee extension to </= 3 degrees and >/= 115 degrees to promote functional and efficient gait pattern with </= 1/10 pain     Baseline  Right knee 118 flexion    Time  6    Period  Weeks    Status  On-going      PT LONG TERM GOAL #2   Title  increase bil LE strenght to >/= 4+/5 to promote stability and safety with walking and navigate steps     Baseline  hip abduction and bil knees 4+/5, except right hamstring 4/5    Time  6    Period  Weeks    Status  Partially Met      PT LONG TERM GOAL #3   Title  pt to be able to walk and navigate >/= 12 steps reciprocally with </= 1/10 pain with no report of feeling like her knee is going to buckle     Baseline  difficulty descending reciprocally     Time  6    Period  Weeks    Status  On-going      PT LONG TERM GOAL #4   Title  increase FOTO score to </= 42% limited to demo improvement in function     Baseline  03/23/2018 -54% limited     Time  6    Period  Weeks    Status  On-going      PT LONG TERM GOAL #5   Title  pt to be I with all HEP given as of last visit to maintain and progress current level  of function     Baseline  independent thus far     Time  6    Period  Weeks    Status  On-going            Plan - 04/21/18 3546    Clinical Impression Statement  Focused stair training. SHe lacks eccentric control for descent of 6 inch stairs and requires UE support to ascend the stairs. Worked on 4 inch step downs with max cues to use eccentric control. Afterward she feels knees are loosened up. She reports she uses 2, 5# weights with stand to sit exercise in her class and reports it always increases her pain. Recommended she consider a lower weight for that exercise. Worked on sit-stands without weight focusing on slow eccentric lowering. Some increased pain with seated 5# LAQ exercise, bilatera. She declined modalities.     PT Next Visit Plan  eccentric hamstring exercise to promote remodeling of tendon, quad and hip strengthening, try painfree hamstring strength on right; continue step ups/ sit-stands; update HEP, ask about McConnell arch taping-does she want to learn? , IASTM prn, practice stairs, step ups on 6 in and 8 in step (if tolerable)     PT Home Exercise Plan  Sidelying hip abduction, bridges, SLR, hamstring stretch, sit-stand from elevated seat, standing hip abduction and extension; hip ER ex with theraband, heel raises with inversion, Seated knee theraband extension red, flexion green     Consulted and Agree with Plan of Care  Patient       Patient will benefit from skilled therapeutic intervention in order to improve the following deficits and impairments:  Abnormal gait, Pain, Decreased range of motion, Postural dysfunction, Improper body mechanics, Decreased knowledge  of precautions, Decreased strength, Decreased activity tolerance, Decreased endurance  Visit Diagnosis: Chronic pain of right knee  Chronic pain of left knee  Muscle weakness (generalized)  Other abnormalities of gait and mobility     Problem List Patient Active Problem List   Diagnosis Date Noted   . Iron deficiency anemia 08/12/2017  . Osteoporosis 10/02/2016  . Systemic lupus erythematosus ([place]) 10/02/2016  . Rheumatoid arthritis involving multiple sites with positive rheumatoid factor ([place]) 10/02/2016  . High risk medication use 10/02/2016  . History of chronic kidney disease 10/02/2016  . 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/2016  . Primary osteoarthritis of both knees 10/02/2016  . Vitamin D deficiency 10/02/2016  . History of diabetes mellitus 10/02/2016  . History of hypertension 10/02/2016  . History of asthma 10/02/2016  . Absolute anemia   . MGUS (monoclonal gammopathy of unknown significance)   . Normocytic anemia 03/21/2015  . Essential hypertension 03/20/2015  . Abnormal CT of the chest 07/30/2012  . Asthma 06/19/2012    Dorene Ar , PTA 04/21/2018, 8:28 AM  [facility] [place] [address] [place], [place], [address] Phone: [phone]   Fax:  [phone]  Name: Hannah [name] MRN: [id number] Date of Birth: Feb 26, 1946

## 2018-04-21 NOTE — Telephone Encounter (Signed)
Patient was approved for $1000 grant for Prolia via the [employer]. Coverage dates are 03/22/2018 to 03/22/2019.  Will send documents to scan center.  [id number] ID- [id number] Group- [id number]   9:28 AM [name], CPhT

## 2018-04-21 NOTE — Telephone Encounter (Signed)
Called and discussed patient assistance options with patient for Prolia. She confirmed interest. Discussed possible required documents that may be needed. Will follow we receive a response.  9:01 AM [name], CPhT

## 2018-04-22 ENCOUNTER — Telehealth: Payer: Self-pay | Admitting: Pharmacy Technician

## 2018-04-22 NOTE — Telephone Encounter (Signed)
Prolia has arrived at clinic from [facility]. Patient is ready to schedule lab and nursing visits.  Thanks!  3:43 PM  [name], CPhT

## 2018-04-25 NOTE — Telephone Encounter (Signed)
Patient has been scheduled for 04/26/18 at 3:30 pm.

## 2018-04-26 ENCOUNTER — Encounter: Payer: Self-pay | Admitting: Physical Therapy

## 2018-04-26 ENCOUNTER — Ambulatory Visit: Payer: Medicare HMO | Admitting: Physical Therapy

## 2018-04-26 ENCOUNTER — Ambulatory Visit: Payer: Medicare HMO | Admitting: *Deleted

## 2018-04-26 DIAGNOSIS — G8929 Other chronic pain: Secondary | ICD-10-CM | POA: Diagnosis not present

## 2018-04-26 DIAGNOSIS — M810 Age-related osteoporosis without current pathological fracture: Secondary | ICD-10-CM

## 2018-04-26 DIAGNOSIS — R2689 Other abnormalities of gait and mobility: Secondary | ICD-10-CM | POA: Diagnosis not present

## 2018-04-26 DIAGNOSIS — M6281 Muscle weakness (generalized): Secondary | ICD-10-CM

## 2018-04-26 DIAGNOSIS — M25561 Pain in right knee: Secondary | ICD-10-CM | POA: Diagnosis not present

## 2018-04-26 DIAGNOSIS — M25562 Pain in left knee: Secondary | ICD-10-CM

## 2018-04-26 MED ORDER — DENOSUMAB 60 MG/ML ~~LOC~~ SOSY
60.0000 mg | PREFILLED_SYRINGE | Freq: Once | SUBCUTANEOUS | Status: AC
  Administered 2018-04-26: 60 mg via SUBCUTANEOUS

## 2018-04-26 NOTE — Progress Notes (Signed)
Patient in office today for a Prolia injection. Patient was given injection in he rupper left arm and tolerated injection well. patient reminded to come back to get labs in 10 days.   Administrations This Visit    denosumab (PROLIA) injection 60 mg    Admin Date 04/26/2018 Action Given Dose 60 mg Route Subcutaneous Administered By Carole Binning, LPN

## 2018-04-26 NOTE — Therapy (Signed)
[place], [place], [address] Phone: [phone]   Fax:  [phone]  Physical Therapy Treatment  Patient Details  Name: Hannah [name] MRN: [id number] Date of Birth: 1945-12-04 Referring Provider: Ofilia Neas, PA-C   Encounter Date: 04/26/2018  PT End of Session - 04/26/18 1459    Visit Number  15    Number of Visits  17    Date for PT Re-Evaluation  05/09/18    PT Start Time  2774    PT Stop Time  1500    PT Time Calculation (min)  45 min    Activity Tolerance  Patient tolerated treatment well    Behavior During Therapy  [facility] for tasks assessed/performed       Past Medical History:  Diagnosis Date  . Arthritis   . Asthma   . Diabetes mellitus without complication ([place])   . Gout 12/17/2014   patient reported  . Hyperlipidemia   . Hypertension     Past Surgical History:  Procedure Laterality Date  . CATARACT EXTRACTION Bilateral 2015  . DILATION AND CURETTAGE OF UTERUS      There were no vitals filed for this visit.  Subjective Assessment - 04/26/18 1414    Subjective  "I am doing fine today. I am not having much pain."    Currently in Pain?  Yes    Pain Score  3     Pain Location  Knee    Pain Orientation  Right;Left    Pain Descriptors / Indicators  Aching    Pain Type  Chronic pain    Aggravating Factors   nustep, going up and down stairs, stepping off curbs    Pain Relieving Factors  heat, massage, stretch         [facility] PT Assessment - 04/26/18 0001      Special Tests   Other special tests  positive knee L & R noble's                   [facility] Adult PT Treatment/Exercise - 04/26/18 0001      Knee/Hip Exercises: Stretches   Sports administrator  Both;2 reps;30 seconds prone, targetting vastus lateralis - pt very tight      Knee/Hip Exercises: Aerobic   Nustep  L4 x 5 min (BLE only); L4 x 2 min (BLE) for reassessment following manual treatment      Knee/Hip  Exercises: Standing   Step Down  Both;1 set;15 reps;Hand Hold: 1;Step Height: 4" cues to keep knees in line with toes when descending      Manual Therapy   Soft tissue mobilization  MTPr and muscle rolling L vastus lateralis               PT Short Term Goals - 04/05/18 1348      PT SHORT TERM GOAL #1   Title  pt to be i with inital HEP     Time  3    Period  Weeks    Status  Achieved      PT SHORT TERM GOAL #2   Title  pt to demo techniques to prevent/ reduce pain and inflammation via rice/ HEP    Baseline  using massage, rest, ice as needed    Time  3    Period  Weeks    Status  Achieved        PT Long Term Goals - 04/05/18 1416  PT LONG TERM GOAL #1   Title  increase bil knee extension to </= 3 degrees and >/= 115 degrees to promote functional and efficient gait pattern with </= 1/10 pain     Baseline  Right knee 118 flexion    Time  6    Period  Weeks    Status  On-going      PT LONG TERM GOAL #2   Title  increase bil LE strenght to >/= 4+/5 to promote stability and safety with walking and navigate steps     Baseline  hip abduction and bil knees 4+/5, except right hamstring 4/5    Time  6    Period  Weeks    Status  Partially Met      PT LONG TERM GOAL #3   Title  pt to be able to walk and navigate >/= 12 steps reciprocally with </= 1/10 pain with no report of feeling like her knee is going to buckle     Baseline  difficulty descending reciprocally     Time  6    Period  Weeks    Status  On-going      PT LONG TERM GOAL #4   Title  increase FOTO score to </= 42% limited to demo improvement in function     Baseline  03/23/2018 -54% limited     Time  6    Period  Weeks    Status  On-going      PT LONG TERM GOAL #5   Title  pt to be I with all HEP given as of last visit to maintain and progress current level of function     Baseline  independent thus far     Time  6    Period  Weeks    Status  On-going            Plan - 04/26/18 1645     Clinical Impression Statement  Pt reporting that every so often, a muscle "pops out of place" on the posterolateral aspect of her L knee. Upon assessment, pt presents with positive Noble's test and tight/tender L ITB. Treatment focused on MTPr and muscle rolling of L vastus lateralis. Pt reports decreased pain following treatment. Also focused on eccentric control when descending 4 in step. Pt still struggles with eccentric control RLE>LLE.    PT Treatment/Interventions  ADLs/Self Care Home Management;Cryotherapy;Electrical Stimulation;Iontophoresis 69m/ml Dexamethasone;Moist Heat;Taping;Therapeutic activities;Therapeutic exercise;Manual techniques;Dry needling;Patient/family education;Passive range of motion;Ultrasound;Gait training;Stair training;Balance training    PT Next Visit Plan  eccentric hamstring exercise to promote remodeling of tendon, quad and hip strengthening, try painfree hamstring strength on right; continue step ups/ sit-stands; update HEP, ask about McConnell arch taping-does she want to learn? , IASTM prn, practice stairs, step ups on 6 in and 8 in step (if tolerable), manual for L ITB/vastus lateralis prn    PT Home Exercise Plan  Sidelying hip abduction, bridges, SLR, hamstring stretch, sit-stand from elevated seat, standing hip abduction and extension; hip ER ex with theraband, heel raises with inversion, Seated knee theraband extension red, flexion green     Consulted and Agree with Plan of Care  Patient       Patient will benefit from skilled therapeutic intervention in order to improve the following deficits and impairments:  Abnormal gait, Pain, Decreased range of motion, Postural dysfunction, Improper body mechanics, Decreased knowledge of precautions, Decreased strength, Decreased activity tolerance, Decreased endurance  Visit Diagnosis: Chronic pain of right knee  Chronic pain of  left knee  Muscle weakness (generalized)  Other abnormalities of gait and  mobility     Problem List Patient Active Problem List   Diagnosis Date Noted  . Iron deficiency anemia 08/12/2017  . Osteoporosis 10/02/2016  . Systemic lupus erythematosus ([place]) 10/02/2016  . Rheumatoid arthritis involving multiple sites with positive rheumatoid factor ([place]) 10/02/2016  . High risk medication use 10/02/2016  . History of chronic kidney disease 10/02/2016  . 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/2016  . Primary osteoarthritis of both knees 10/02/2016  . Vitamin D deficiency 10/02/2016  . History of diabetes mellitus 10/02/2016  . History of hypertension 10/02/2016  . History of asthma 10/02/2016  . Absolute anemia   . MGUS (monoclonal gammopathy of unknown significance)   . Normocytic anemia 03/21/2015  . Essential hypertension 03/20/2015  . Abnormal CT of the chest 07/30/2012  . Asthma 06/19/2012   Worthy Flank, SPT 04/26/18 4:53 PM   [facility] [address] [place], [place], [address] Phone: [phone]   Fax:  [phone]  Name: Hannah [name] MRN: [id number] Date of Birth: [birth year]

## 2018-05-04 DIAGNOSIS — R011 Cardiac murmur, unspecified: Secondary | ICD-10-CM | POA: Diagnosis not present

## 2018-05-04 DIAGNOSIS — E039 Hypothyroidism, unspecified: Secondary | ICD-10-CM | POA: Diagnosis not present

## 2018-05-04 DIAGNOSIS — E1122 Type 2 diabetes mellitus with diabetic chronic kidney disease: Secondary | ICD-10-CM | POA: Diagnosis not present

## 2018-05-04 DIAGNOSIS — I129 Hypertensive chronic kidney disease with stage 1 through stage 4 chronic kidney disease, or unspecified chronic kidney disease: Secondary | ICD-10-CM | POA: Diagnosis not present

## 2018-05-04 DIAGNOSIS — M170 Bilateral primary osteoarthritis of knee: Secondary | ICD-10-CM | POA: Diagnosis not present

## 2018-05-04 DIAGNOSIS — N183 Chronic kidney disease, stage 3 (moderate): Secondary | ICD-10-CM | POA: Diagnosis not present

## 2018-05-04 DIAGNOSIS — D509 Iron deficiency anemia, unspecified: Secondary | ICD-10-CM | POA: Diagnosis not present

## 2018-05-05 ENCOUNTER — Ambulatory Visit: Payer: Medicare HMO | Attending: Physician Assistant | Admitting: Physical Therapy

## 2018-05-05 ENCOUNTER — Encounter: Payer: Self-pay | Admitting: Physical Therapy

## 2018-05-05 DIAGNOSIS — M25561 Pain in right knee: Secondary | ICD-10-CM | POA: Diagnosis not present

## 2018-05-05 DIAGNOSIS — R2689 Other abnormalities of gait and mobility: Secondary | ICD-10-CM | POA: Insufficient documentation

## 2018-05-05 DIAGNOSIS — G8929 Other chronic pain: Secondary | ICD-10-CM | POA: Diagnosis not present

## 2018-05-05 DIAGNOSIS — M25562 Pain in left knee: Secondary | ICD-10-CM | POA: Diagnosis not present

## 2018-05-05 DIAGNOSIS — M6281 Muscle weakness (generalized): Secondary | ICD-10-CM | POA: Diagnosis not present

## 2018-05-05 NOTE — Therapy (Deleted)
[place], [place], [address] Phone: [phone]   Fax:  [phone]  Physical Therapy Treatment  Patient Details  Name: Hannah [name] MRN: [id number] Date of Birth: 1946-02-16 Referring Provider: Ofilia Neas, PA-C   Encounter Date: 05/05/2018  PT End of Session - 05/05/18 0922    Visit Number  16    Number of Visits  17    Date for PT Re-Evaluation  05/09/18    PT Start Time  0838    PT Stop Time  0919    PT Time Calculation (min)  41 min    Activity Tolerance  Patient tolerated treatment well    Behavior During Therapy  [facility] for tasks assessed/performed       Past Medical History:  Diagnosis Date  . Arthritis   . Asthma   . Diabetes mellitus without complication ([place])   . Gout 12/17/2014   patient reported  . Hyperlipidemia   . Hypertension     Past Surgical History:  Procedure Laterality Date  . CATARACT EXTRACTION Bilateral 2015  . DILATION AND CURETTAGE OF UTERUS      There were no vitals filed for this visit.  Subjective Assessment - 05/05/18 0843    Subjective  "I am a little sore in my L knee today. I was able to climb the steps at the [facility] with minimal pain and walk 2 blocks back to the car with minimal pain. I had my yearly physical yesterday and they think I might have a heart murmur so I have to get an EKG."    Currently in Pain?  Yes    Pain Score  5     Pain Location  Knee    Pain Orientation  Right;Left    Pain Descriptors / Indicators  Aching;Sore    Pain Type  Chronic pain    Pain Onset  More than a month ago    Pain Frequency  Intermittent    Aggravating Factors   repetitive movements (knee flexion/extension)    Pain Relieving Factors  heat, massage, stretch                       [facility] Adult PT Treatment/Exercise - 05/05/18 0001      Knee/Hip Exercises: Stretches   Active Hamstring Stretch  30 seconds;2 reps;Right    Quad Stretch   Both;2 reps;30 seconds    Gastroc Stretch  2 reps;30 seconds;Both    Other Knee/Hip Stretches  ITB stretch R 2 x 30 seconds      Knee/Hip Exercises: Aerobic   Nustep  L4 x 5 min (BLE only); L      Knee/Hip Exercises: Standing   Forward Step Up  Both;1 set;10 reps;Hand Hold: 1;Step Height: 6"      Knee/Hip Exercises: Seated   Sit to Sand  10 reps;without UE support;1 set      Knee/Hip Exercises: Prone   Hamstring Curl  2 sets;10 reps    Hamstring Curl Limitations  BLE. 2nd set with 2lb weight. Emphasis on eccentric control      Manual Therapy   Soft tissue mobilization  muscle rolling R vastus lateralis             PT Education - 05/05/18 0921    Education provided  Yes    Education Details  discussed exercises pt performs in her exercises classes; advised pt to ensure good alignment of knees during squats to  reduce pain she is experiencing in anterior knees.     Person(s) Educated  Patient    Methods  Explanation    Comprehension  Verbalized understanding                 Plan - 05/05/18 2297    Clinical Impression Statement  Pt reports pain in anterior L knee today. Discussed this may be from poor squat form during her exercise class. Educated on importance of proper alignment of knees during squat. Treatment focused on manual treatment of L vastus lateralius and ITB in addition to stretching of gastrocs, hamstrings, ITB, and quads. Practiced forward step ups on 6 in steps today; pt has increased use of UE to assist in step up today. She complains of anterior knee pain. Reiterated importance of proper knee alignment. At end of session, pt reports she feels like she could "run a marathon."    PT Treatment/Interventions  ADLs/Self Care Home Management;Cryotherapy;Electrical Stimulation;Iontophoresis 4mg /ml Dexamethasone;Moist Heat;Taping;Therapeutic activities;Therapeutic exercise;Manual techniques;Dry needling;Patient/family education;Passive range of  motion;Ultrasound;Gait training;Stair training;Balance training    PT Next Visit Plan  REASSESS, pt would like to address balance, finalize HEP, eccentric hamstring exercise to promote remodeling of tendon, quad and hip strengthening, try painfree hamstring strength on right; continue step ups/ sit-stands; update HEP, ask about McConnell arch taping-does she want to learn? , IASTM prn, practice stairs, step ups on 6 in and 8 in step (if tolerable), manual for L ITB/vastus lateralis prn    PT Home Exercise Plan  Sidelying hip abduction, bridges, SLR, hamstring stretch, sit-stand from elevated seat, standing hip abduction and extension; hip ER ex with theraband, heel raises with inversion, Seated knee theraband extension red, flexion green     Consulted and Agree with Plan of Care  Patient       Patient will benefit from skilled therapeutic intervention in order to improve the following deficits and impairments:  Abnormal gait, Pain, Decreased range of motion, Postural dysfunction, Improper body mechanics, Decreased knowledge of precautions, Decreased strength, Decreased activity tolerance, Decreased endurance  Visit Diagnosis: Chronic pain of right knee  Chronic pain of left knee  Muscle weakness (generalized)  Other abnormalities of gait and mobility     Problem List Patient Active Problem List   Diagnosis Date Noted  . Iron deficiency anemia 08/12/2017  . Osteoporosis 10/02/2016  . Systemic lupus erythematosus ([place]) 10/02/2016  . Rheumatoid arthritis involving multiple sites with positive rheumatoid factor ([place]) 10/02/2016  . High risk medication use 10/02/2016  . History of chronic kidney disease 10/02/2016  . 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/2016  . Primary osteoarthritis of both knees 10/02/2016  . Vitamin D deficiency 10/02/2016  . History of diabetes mellitus 10/02/2016  . History of hypertension 10/02/2016  . History of asthma 10/02/2016  .  Absolute anemia   . MGUS (monoclonal gammopathy of unknown significance)   . Normocytic anemia 03/21/2015  . Essential hypertension 03/20/2015  . Abnormal CT of the chest 07/30/2012  . Asthma 06/19/2012   Worthy Flank, SPT 05/05/18 9:59 AM   [facility] [address] [place], [place], [address] Phone: [phone]   Fax:  [phone]  Name: ANALEYA LUALLEN MRN: [id number] Date of Birth: [birth year]

## 2018-05-05 NOTE — Therapy (Signed)
[place], [place], [address] Phone: [phone]   Fax:  [phone]  Physical Therapy Treatment  Patient Details  Name: ROMEKA SCIFRES MRN: [id number] Date of Birth: Apr 17, 1946 Referring Provider: Ofilia Neas, PA-C   Encounter Date: 05/05/2018  PT End of Session - 05/05/18 0922    Visit Number  16    Number of Visits  17    Date for PT Re-Evaluation  05/09/18    PT Start Time  0838    PT Stop Time  0919    PT Time Calculation (min)  41 min    Activity Tolerance  Patient tolerated treatment well    Behavior During Therapy  [facility] for tasks assessed/performed       Past Medical History:  Diagnosis Date  . Arthritis   . Asthma   . Diabetes mellitus without complication ([facility])   . Gout 12/17/2014   patient reported  . Hyperlipidemia   . Hypertension     Past Surgical History:  Procedure Laterality Date  . CATARACT EXTRACTION Bilateral 2015  . DILATION AND CURETTAGE OF UTERUS      There were no vitals filed for this visit.  Subjective Assessment - 05/05/18 0843    Subjective  "I am a little sore in my L knee today. I was able to climb the steps at the [facility] with minimal pain and walk 2 blocks back to the car with minimal pain. I had my yearly physical yesterday and they think I might have a heart murmur so I have to get an EKG."    Currently in Pain?  Yes    Pain Score  5     Pain Location  Knee    Pain Orientation  Right;Left    Pain Descriptors / Indicators  Aching;Sore    Pain Type  Chronic pain    Pain Onset  More than a month ago    Pain Frequency  Intermittent    Aggravating Factors   repetitive movements (knee flexion/extension)    Pain Relieving Factors  heat, massage, stretch                       [facility] Adult PT Treatment/Exercise - 05/05/18 0001      Knee/Hip Exercises: Stretches   Active Hamstring Stretch  30 seconds;2 reps;Right    Quad Stretch   Both;2 reps;30 seconds    Gastroc Stretch  2 reps;30 seconds;Both    Other Knee/Hip Stretches  ITB stretch R 2 x 30 seconds      Knee/Hip Exercises: Aerobic   Nustep  L4 x 5 min (BLE only); L      Knee/Hip Exercises: Standing   Forward Step Up  Both;1 set;10 reps;Hand Hold: 1;Step Height: 6"      Knee/Hip Exercises: Seated   Sit to Sand  10 reps;without UE support;1 set      Knee/Hip Exercises: Prone   Hamstring Curl  2 sets;10 reps    Hamstring Curl Limitations  BLE. 2nd set with 2lb weight. Emphasis on eccentric control      Manual Therapy   Soft tissue mobilization  muscle rolling R vastus lateralis             PT Education - 05/05/18 0921    Education provided  Yes    Education Details  discussed exercises pt performs in her exercises classes; advised pt to ensure good alignment of knees during squats to  reduce pain she is experiencing in anterior knees.     Person(s) Educated  Patient    Methods  Explanation    Comprehension  Verbalized understanding                 Plan - 05/05/18 1007    Clinical Impression Statement  Pt reports pain in anterior L knee today. Discussed this may be from poor squat form during her exercise class. Educated on importance of proper alignment of knees during squat. Treatment focused on manual treatment of L vastus lateralius and ITB in addition to stretching of gastrocs, hamstrings, ITB, and quads. Practiced forward step ups on 6 in steps today; pt has increased use of UE to assist in step up today. She complains of anterior knee pain. Reiterated importance of proper knee alignment. At end of session, pt reports she feels like she could "run a marathon."    PT Treatment/Interventions  ADLs/Self Care Home Management;Cryotherapy;Electrical Stimulation;Iontophoresis 4mg /ml Dexamethasone;Moist Heat;Taping;Therapeutic activities;Therapeutic exercise;Manual techniques;Dry needling;Patient/family education;Passive range of  motion;Ultrasound;Gait training;Stair training;Balance training    PT Next Visit Plan  REASSESS, pt would like to address balance, finalize HEP, eccentric hamstring exercise to promote remodeling of tendon, quad and hip strengthening, try painfree hamstring strength on right; continue step ups/ sit-stands; update HEP, ask about McConnell arch taping-does she want to learn? , IASTM prn, practice stairs, step ups on 6 in and 8 in step (if tolerable), manual for L ITB/vastus lateralis prn    PT Home Exercise Plan  Sidelying hip abduction, bridges, SLR, hamstring stretch, sit-stand from elevated seat, standing hip abduction and extension; hip ER ex with theraband, heel raises with inversion, Seated knee theraband extension red, flexion green     Consulted and Agree with Plan of Care  Patient       Patient will benefit from skilled therapeutic intervention in order to improve the following deficits and impairments:  Abnormal gait, Pain, Decreased range of motion, Postural dysfunction, Improper body mechanics, Decreased knowledge of precautions, Decreased strength, Decreased activity tolerance, Decreased endurance  Visit Diagnosis: Chronic pain of right knee  Chronic pain of left knee  Muscle weakness (generalized)  Other abnormalities of gait and mobility     Problem List Patient Active Problem List   Diagnosis Date Noted  . Iron deficiency anemia 08/12/2017  . Osteoporosis 10/02/2016  . Systemic lupus erythematosus ([place]) 10/02/2016  . Rheumatoid arthritis involving multiple sites with positive rheumatoid factor ([place]) 10/02/2016  . High risk medication use 10/02/2016  . History of chronic kidney disease 10/02/2016  . 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/2016  . Primary osteoarthritis of both knees 10/02/2016  . Vitamin D deficiency 10/02/2016  . History of diabetes mellitus 10/02/2016  . History of hypertension 10/02/2016  . History of asthma 10/02/2016  .  Absolute anemia   . MGUS (monoclonal gammopathy of unknown significance)   . Normocytic anemia 03/21/2015  . Essential hypertension 03/20/2015  . Abnormal CT of the chest 07/30/2012  . Asthma 06/19/2012   Worthy Flank, SPT 05/05/18 9:30 AM   [place], [place], [address] Phone: [phone]   Fax:  [phone]  Name: ALEATHIA PURDY MRN: [id number] Date of Birth: 22-Jul-1946

## 2018-05-09 ENCOUNTER — Other Ambulatory Visit: Payer: Self-pay

## 2018-05-09 DIAGNOSIS — Z79899 Other long term (current) drug therapy: Secondary | ICD-10-CM | POA: Diagnosis not present

## 2018-05-09 DIAGNOSIS — M3219 Other organ or system involvement in systemic lupus erythematosus: Secondary | ICD-10-CM | POA: Diagnosis not present

## 2018-05-10 ENCOUNTER — Ambulatory Visit: Payer: Medicare HMO | Admitting: Physical Therapy

## 2018-05-10 ENCOUNTER — Encounter: Payer: Self-pay | Admitting: Physical Therapy

## 2018-05-10 DIAGNOSIS — M25562 Pain in left knee: Secondary | ICD-10-CM

## 2018-05-10 DIAGNOSIS — G8929 Other chronic pain: Secondary | ICD-10-CM

## 2018-05-10 DIAGNOSIS — M25561 Pain in right knee: Principal | ICD-10-CM

## 2018-05-10 DIAGNOSIS — M6281 Muscle weakness (generalized): Secondary | ICD-10-CM

## 2018-05-10 DIAGNOSIS — R2689 Other abnormalities of gait and mobility: Secondary | ICD-10-CM

## 2018-05-10 LAB — CBC WITH DIFFERENTIAL/PLATELET
Basophils Absolute: 42 cells/uL (ref 0–200)
Basophils Relative: 0.8 %
Eosinophils Absolute: 472 cells/uL (ref 15–500)
Eosinophils Relative: 8.9 %
HCT: 30.3 % — ABNORMAL LOW (ref 35.0–45.0)
Hemoglobin: 10 g/dL — ABNORMAL LOW (ref 11.7–15.5)
Lymphs Abs: 1113 cells/uL (ref 850–3900)
MCH: 30.4 pg (ref 27.0–33.0)
MCHC: 33 g/dL (ref 32.0–36.0)
MCV: 92.1 fL (ref 80.0–100.0)
MPV: 12 fL (ref 7.5–12.5)
Monocytes Relative: 8.9 %
Neutro Abs: 3201 cells/uL (ref 1500–7800)
Neutrophils Relative %: 60.4 %
Platelets: 155 10*3/uL (ref 140–400)
RBC: 3.29 10*6/uL — ABNORMAL LOW (ref 3.80–5.10)
RDW: 13.1 % (ref 11.0–15.0)
Total Lymphocyte: 21 %
WBC mixed population: 472 cells/uL (ref 200–950)
WBC: 5.3 10*3/uL (ref 3.8–10.8)

## 2018-05-10 LAB — COMPLETE METABOLIC PANEL WITH GFR
AG Ratio: 1.6 (calc) (ref 1.0–2.5)
ALT: 22 U/L (ref 6–29)
AST: 23 U/L (ref 10–35)
Albumin: 3.9 g/dL (ref 3.6–5.1)
Alkaline phosphatase (APISO): 64 U/L (ref 33–130)
BUN/Creatinine Ratio: 17 (calc) (ref 6–22)
BUN: 19 mg/dL (ref 7–25)
CO2: 26 mmol/L (ref 20–32)
Calcium: 10.2 mg/dL (ref 8.6–10.4)
Chloride: 106 mmol/L (ref 98–110)
Creat: 1.09 mg/dL — ABNORMAL HIGH (ref 0.60–0.93)
GFR, Est African American: 59 mL/min/{1.73_m2} — ABNORMAL LOW (ref >=60)
GFR, Est Non African American: 51 mL/min/{1.73_m2} — ABNORMAL LOW (ref >=60)
Globulin: 2.5 g/dL (calc) (ref 1.9–3.7)
Glucose, Bld: 93 mg/dL (ref 65–99)
Potassium: 5.3 mmol/L (ref 3.5–5.3)
Sodium: 140 mmol/L (ref 135–146)
Total Bilirubin: 0.4 mg/dL (ref 0.2–1.2)
Total Protein: 6.4 g/dL (ref 6.1–8.1)

## 2018-05-10 NOTE — Therapy (Addendum)
[place], [place], [address] Phone: [phone]   Fax:  [phone]  Physical Therapy Treatment/Discharge Summary  Patient Details  Name: Hannah [name] MRN: [id number] Date of Birth: [birth year] Referring Provider: Ofilia Neas, PA-C   Encounter Date: 05/10/2018  PT End of Session - 05/10/18 1222    Visit Number  17    Number of Visits  17    Date for PT Re-Evaluation  05/09/18    PT Start Time  0935    PT Stop Time  1015    PT Time Calculation (min)  40 min    Activity Tolerance  Patient tolerated treatment well    Behavior During Therapy  [facility] for tasks assessed/performed       Past Medical History:  Diagnosis Date  . Arthritis   . Asthma   . Diabetes mellitus without complication ([place])   . Gout 12/17/2014   patient reported  . Hyperlipidemia   . Hypertension     Past Surgical History:  Procedure Laterality Date  . CATARACT EXTRACTION Bilateral 2015  . DILATION AND CURETTAGE OF UTERUS      There were no vitals filed for this visit.  Subjective Assessment - 05/10/18 0936    Subjective  "my knees are killing me today. It started on Saturday and has been bothering her since then."    Currently in Pain?  Yes    Pain Score  6     Pain Location  Knee    Pain Orientation  Right;Left    Pain Descriptors / Indicators  Aching;Sore    Pain Type  Chronic pain    Pain Onset  More than a month ago    Pain Frequency  Intermittent         [facility] PT Assessment - 05/10/18 0001      Observation/Other Assessments   Focus on Therapeutic Outcomes (FOTO)   50% limited      AROM   Right Knee Extension  8    Right Knee Flexion  116   painful throughout movement   Left Knee Extension  7    Left Knee Flexion  113      Strength   Right Hip Flexion  4+/5    Right Hip ABduction  4/5    Left Hip Flexion  4+/5    Left Hip ABduction  4/5    Right Knee Flexion  4+/5    Right Knee Extension   5/5    Left Knee Flexion  4+/5    Left Knee Extension  5/5             [facility] Adult PT Treatment/Exercise - 05/10/18 0001      Knee/Hip Exercises: Stretches   Sports administrator  Both;2 reps;30 seconds   prone with strap     Knee/Hip Exercises: Standing   Forward Step Up  Both;2 sets;10 reps;Hand Hold: 1;Step Height: 2"   performed with green theraband on knee   Forward Step Up Limitations  encouraged quad set during step up      Knee/Hip Exercises: Supine   Quad Sets  AROM;Both;2 sets;15 reps   5 sec hold     Manual Therapy   Manual therapy comments  slow creep patellar stretching pushing medial performed bil                PT Short Term Goals - 04/05/18 1348      PT SHORT TERM GOAL #1  Title  pt to be i with inital HEP     Time  3    Period  Weeks    Status  Achieved      PT SHORT TERM GOAL #2   Title  pt to demo techniques to prevent/ reduce pain and inflammation via rice/ HEP    Baseline  using massage, rest, ice as needed    Time  3    Period  Weeks    Status  Achieved        PT Long Term Goals - 05/10/18 0949      PT LONG TERM GOAL #1   Title  increase bil knee extension to </= 3 degrees and >/= 115 degrees to promote functional and efficient gait pattern with </= 1/10 pain     Baseline  L knee flex 113, ext 7; R knee flex 116, ext 8    Status  Partially Met      PT LONG TERM GOAL #2   Title  increase bil LE strenght to >/= 4+/5 to promote stability and safety with walking and navigate steps     Baseline  bil hip flex 4+/5, bil hip abd 4/5, bil knee flex 4+/5, bil knee ext 5/5    Status  Partially Met      PT LONG TERM GOAL #3   Title  pt to be able to walk and navigate >/= 12 steps reciprocally with </= 1/10 pain with no report of feeling like her knee is going to buckle     Status  Achieved      PT LONG TERM GOAL #4   Title  increase FOTO score to </= 42% limited to demo improvement in function     Baseline  05/10/2018- 50% limited     Status  Partially Met      PT LONG TERM GOAL #5   Title  pt to be I with all HEP given as of last visit to maintain and progress current level of function     Status  Achieved            Plan - 05/10/18 1158    Clinical Impression Statement  Pt reporting increased soreness in bilateral knees today that have been bothering her since Saturday. Reassessment performed today; pt showing similar ranges of motion and strength in knees and hips since prior assessment. Based on pt's consistent pain level and similar strength and range of motion levels as compared to previous assessment, determing it was appropriate to discharge pt at this time. Pt agreed with this. She is indepedent with her HEP and will continue with exercises at home.     PT Treatment/Interventions  ADLs/Self Care Home Management;Cryotherapy;Electrical Stimulation;Iontophoresis 64m/ml Dexamethasone;Moist Heat;Taping;Therapeutic activities;Therapeutic exercise;Manual techniques;Dry needling;Patient/family education;Passive range of motion;Ultrasound;Gait training;Stair training;Balance training    PT Next Visit Plan  pt discharged today    PT Home Exercise Plan  Sidelying hip abduction, bridges, SLR, hamstring stretch, sit-stand from elevated seat, standing hip abduction and extension; hip ER ex with theraband, heel raises with inversion, Seated knee theraband extension red, flexion green     Consulted and Agree with Plan of Care  Patient       Patient will benefit from skilled therapeutic intervention in order to improve the following deficits and impairments:  Abnormal gait, Pain, Decreased range of motion, Postural dysfunction, Improper body mechanics, Decreased knowledge of precautions, Decreased strength, Decreased activity tolerance, Decreased endurance  Visit Diagnosis: Chronic pain of right knee  Chronic  pain of left knee  Muscle weakness (generalized)  Other abnormalities of gait and mobility     Problem  List Patient Active Problem List   Diagnosis Date Noted  . Iron deficiency anemia 08/12/2017  . Osteoporosis 10/02/2016  . Systemic lupus erythematosus ([place]) 10/02/2016  . Rheumatoid arthritis involving multiple sites with positive rheumatoid factor ([place]) 10/02/2016  . High risk medication use 10/02/2016  . History of chronic kidney disease 10/02/2016  . 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/2016  . Primary osteoarthritis of both knees 10/02/2016  . Vitamin D deficiency 10/02/2016  . History of diabetes mellitus 10/02/2016  . History of hypertension 10/02/2016  . History of asthma 10/02/2016  . Absolute anemia   . MGUS (monoclonal gammopathy of unknown significance)   . Normocytic anemia 03/21/2015  . Essential hypertension 03/20/2015  . Abnormal CT of the chest 07/30/2012  . Asthma 06/19/2012   Worthy Flank, SPT 05/10/18 12:23 PM   [facility] [address] [place], [place], [address] Phone: [phone]   Fax:  [phone]  Name: Hannah [name] MRN: [id number] Date of Birth: 02-27-1946    PHYSICAL THERAPY DISCHARGE SUMMARY  Visits from Start of Care: 17  Current functional level related to goals / functional outcomes: See above   Remaining deficits: Decreased ROM and slight strength deficits   Education / Equipment: HEP, theraband  Plan: Patient agrees to discharge.  Patient goals were partially met. Patient is being discharged due to lack of progress.  ?????

## 2018-05-10 NOTE — Progress Notes (Signed)
Labs are consistent with chronic anemia.  Creatinine is mildly elevated. Please forward results to PCP.

## 2018-05-11 ENCOUNTER — Telehealth: Payer: Self-pay | Admitting: Rheumatology

## 2018-05-11 NOTE — Telephone Encounter (Signed)
Called patient and discussed hemoglobin levels. Patient verbalized understanding.

## 2018-05-11 NOTE — Telephone Encounter (Signed)
Advised patient of lab results, patient verbalized understanding.  

## 2018-05-11 NOTE — Telephone Encounter (Signed)
Patient returned your call in reference to lab results.

## 2018-05-11 NOTE — Telephone Encounter (Signed)
Patient called stating she is confused about the Anemia test "that you said was problematic."  Patient states she just spoke with Dr. Baird Cancer office who stated they also did labwork and said that "the test for Anemia was normal."  Patient is requesting a return call.

## 2018-05-24 ENCOUNTER — Other Ambulatory Visit: Payer: Self-pay | Admitting: Internal Medicine

## 2018-05-24 ENCOUNTER — Other Ambulatory Visit: Payer: Self-pay

## 2018-05-24 ENCOUNTER — Ambulatory Visit (HOSPITAL_COMMUNITY): Payer: Medicare HMO | Attending: Cardiovascular Disease

## 2018-05-24 DIAGNOSIS — R011 Cardiac murmur, unspecified: Secondary | ICD-10-CM | POA: Insufficient documentation

## 2018-05-24 DIAGNOSIS — E785 Hyperlipidemia, unspecified: Secondary | ICD-10-CM | POA: Insufficient documentation

## 2018-05-24 DIAGNOSIS — I10 Essential (primary) hypertension: Secondary | ICD-10-CM | POA: Insufficient documentation

## 2018-05-24 DIAGNOSIS — E119 Type 2 diabetes mellitus without complications: Secondary | ICD-10-CM | POA: Insufficient documentation

## 2018-05-29 HISTORY — PX: DOPPLER ECHOCARDIOGRAPHY: SHX263

## 2018-06-09 ENCOUNTER — Ambulatory Visit: Payer: Medicare HMO | Admitting: Hematology

## 2018-06-09 ENCOUNTER — Other Ambulatory Visit: Payer: Medicare HMO

## 2018-06-09 DIAGNOSIS — E039 Hypothyroidism, unspecified: Secondary | ICD-10-CM | POA: Diagnosis not present

## 2018-06-16 NOTE — Progress Notes (Signed)
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 The patient's last visit with [place] was on 12/09/17. The pt reports that she is doing well overall.   The pt reports that she has been referred to PT by her PCP and has continued staying active with her [employer]ne[employer] program. She notes that she is now taking 71mg Synthroid, decreased from 1056m. The pt notes that she stopped taking 15066mron Polysaccharide in the interim but will plan on restarting this today again.   Lab results today (06/20/18) of CBC w/diff, CMP, and Reticulocytes is as follows: all values are WNL except for RBC at 3.38, HGB at 10.4, HCT at 32.7, Creatinine at 1.08, GFR at 50. 06/20/18 Iron and TIBC revealed all values WNL 06/20/18 Ferritin at 99  On review of systems, pt reports stable energy levels, staying active, and denies SOB, CP, light headedness, dizziness, new fatigue, abdominal pains, fevers, chills, night sweats, and any other symptoms.   REVIEW OF SYSTEMS:    A 10+ POINT REVIEW OF SYSTEMS WAS OBTAINED including neurology, dermatology, psychiatry, cardiac, respiratory, lymph, extremities, GI, GU, Musculoskeletal, constitutional, breasts, reproductive, HEENT.  All pertinent positives are noted in the HPI.  All others are negative.  . Past Medical History:  Diagnosis Date  . Arthritis   . Asthma   . Diabetes mellitus without complication ([place] . Gout 12/17/2014   patient reported  . Hyperlipidemia   . Hypertension   possible SLE/rheumatoid arthritis following  with Dr.Deveshwar.  . Past Surgical History:  Procedure Laterality Date  . CATARACT EXTRACTION Bilateral 2015  . DILATION AND CURETTAGE OF UTERUS    . DOPPLER ECHOCARDIOGRAPHY  05/2018   Internist to review with pt; potential heart murmur 06/20/18    . Social History   Tobacco Use  . Smoking status: Never Smoker  . Smokeless tobacco: Never Used  Substance Use Topics  . Alcohol use: Yes    Comment: rarely   . Drug use: No    ALLERGIES:  is allergic to shellfish allergy.  MEDICATIONS:  Current Outpatient Medications  Medication Sig Dispense Refill  . allopurinol (ZYLOPRIM) 100 MG tablet Take 100 mg by mouth daily.    . aMarland KitchenLODipine (NORVASC) 2.5 MG tablet     . aspirin EC 81 MG tablet Take 81 mg by mouth daily.    . aMarland Kitchenorvastatin (LIPITOR) 80 MG tablet Take 80 mg by mouth daily.    . budesonide-formoterol (SYMBICORT) 160-4.5 MCG/ACT inhaler Take 2 puffs first thing in am and then another 2 puffs about 12 hours later. 6 g 11  . Calcium Carb-Cholecalciferol (CALCIUM 600+D) 600-800 MG-UNIT TABS Take by mouth 2 (two) times daily.     . Cholecalciferol (VITAMIN D PO) Take 1,000 Units by mouth daily.     . hydroxychloroquine (PLAQUENIL) 200 MG tablet TAKE 1 TABLET(200 MG) BY MOUTH DAILY 90 tablet 0  . Hypromellose (ARTIFICIAL TEARS OP) Apply to eye as needed.    . irbesartan (AVAPRO) 300 MG tablet Take 300 mg daily by mouth.    .[name]  iron polysaccharides (NIFEREX) 150 MG capsule Take 150 mg 2 (two) times daily by mouth.    . levothyroxine (SYNTHROID, LEVOTHROID) 88 MCG tablet Take 88 mcg by mouth daily before breakfast. Monday-Saturday; Skip Sunday    . LUBRICANT EYE DROPS 0.4-0.3 % SOLN     . metFORMIN (GLUCOPHAGE) 500 MG tablet Take 500 mg daily by mouth.     . PROLIA 60 MG/ML SOSY injection INJECT 60 MG INTO THE SKIN EVERY 6 (SIX) MONTHS. ADMINISTER IN UPPER ARM, THIGH, OR ABDOMEN 1 mL 0  . VENTOLIN HFA 108 (90 Base) MCG/ACT inhaler INL 2 PFS PO Q 4 TO 6 H PRN  5   No current  facility-administered medications for this visit.     PHYSICAL EXAMINATION: ECOG PERFORMANCE STATUS: 1 - Symptomatic but completely ambulatory  Vitals:   06/20/18 1045  BP: (!) 176/67  Pulse: 75  Resp: 18  Temp: 98.1 F (36.7 C)  SpO2: 98%    Filed Weights   06/20/18 1045  Weight: 159 lb 1.6 oz (72.2 kg)   .Body mass index is 32.13 kg/m.  GENERAL:alert, in no acute distress and comfortable SKIN: no acute rashes, no significant lesions EYES: conjunctiva are pink and non-injected, sclera anicteric OROPHARYNX: MMM, no exudates, no oropharyngeal erythema or ulceration NECK: supple, no JVD LYMPH:  no palpable lymphadenopathy in the cervical, axillary or inguinal regions LUNGS: clear to auscultation b/l with normal respiratory effort HEART: regular rate & rhythm ABDOMEN:  normoactive bowel sounds , non tender, not distended. No palpable hepatosplenomegaly.  Extremity: no pedal edema PSYCH: alert & oriented x 3 with fluent speech NEURO: no focal motor/sensory deficits   LABORATORY DATA:   I have reviewed the data as listed  . CBC Latest Ref Rng & Units 06/20/2018 05/09/2018 03/07/2018  WBC 3.9 - 10.3 K/uL 4.4 5.3 5.1  Hemoglobin 11.6 - 15.9 g/dL 10.4(L) 10.0(L) 10.9(L)  Hematocrit 34.8 - 46.6 % 32.7(L) 30.3(L) 31.9(L)  Platelets 145 - 400 K/uL 148 155 137(L)    . CMP Latest Ref Rng & Units 06/20/2018 05/09/2018 03/07/2018  Glucose 70 - 99 mg/dL 84 93 76  BUN 8 - 23 mg/dL _0 Creatinine 0.44 - 1.00 mg/dL 1.08(H) 1.09(H) 0.99(H)  Sodium 135 - 145 mmol/L 142 140 140  Potassium 3.5 - 5.1 mmol/L 4.1 5.3 4.3  Chloride 98 - 111 mmol/L 106 106 105  CO2 22 - 32 mmol/L _1 Calcium 8.9 - 10.3 mg/dL 8.9 10.2 9.3  Total Protein 6.5 - 8.1 g/dL 6.8 6.4 6.4  Total Bilirubin 0.3 - 1.2 mg/dL 0.5 0.4 0.5  Alkaline Phos 38 - 126 U/L 52 - -  AST 15 - 41 U/L _2 ALT 0 - 44 U/L _3 .   Lab Results  Component Value Date   FERRITIN 99 06/20/2018   . Lab  Results  Component Value Date   IRON 55 06/20/2018   TIBC 256 06/20/2018   IRONPCTSAT 22 06/20/2018   (Iron and TIBC)  Lab Results  Component Value Date   FERRITIN 99 06/20/2018          RADIOGRAPHIC STUDIES: I have personally reviewed the radiological images as listed and agreed with the findings in the report. No results found. METASTATIC BONE SURVEY 0/02/2015  COMPARISON: None.  FINDINGS: Heart is normal size. Right diaphragmatic hernia again noted, unchanged. Lungs are clear. No effusions.  No focal lytic lesions within the visualized bony structures or acute bony  abnormality. Degenerative changes within the shoulders, lower cervical spine, mid to lower thoracic spine, and lower lumbar spine. Advanced degenerative facet disease in the lower lumbar spine. 9 mm of anterolisthesis of L4 on L5. Endplate sclerosis within L4 and L5. Mild degenerative changes in the hips. Advanced degenerative changes within the knees. 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  IMPRESSION: No focal lytic lesion.  Degenerative joint disease involving multiple joints as described above.  Grade 2 anterolisthesis of L4 on L5 related to facet disease.   ASSESSMENT & PLAN:   72 year old of an American female with  #1 IgG kappa monoclonal gammopathy of undetermined significance. Bone survey X-ray showed no lytic lesions. M spike is 0.6 g/dL in 10/016 and then 0.5g/dl in 10/2015. SPEP in May/2017 and 07/2016 showed M proteinwas stable at 0.3g/dl  MGUS likely related to underlying connective tissue disorder.  Bone marrow biopsy does show about 13% plasma cells however these appear to be polyclonal and likely related to a possible underlying inflammatory disorder. Less likely a biclonal plasma cell dyscrasia.  #2 Normocytic normochromic anemia likely related to chronic inflammation from recent diagnosis of lupus/rheumatoid arthritis + CKD.  Increased  acanthocytes on peripheral blood smear -no overt evidence of liver disease. Could be a marker of some MDS Bone marrow biopsy did not show overt plasma cell dyscrasia or myelodysplastic syndrome. 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 Bone marrow biopsy showed decreased iron stores. Ferritin and iron have been trending down, she has not noticed any issues with bruising or bleeding recently. ?absorption issue. I have given her the option of IV iron which she has previously had without issue. She would like to have this.   PLAN: -Given that the pt has chronic inflammation with RA, optimizing her RA treatment will have a positive impact on her anemia.  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 DEstanislado Pandy Plan -Continue optimization of her autoimmune disorder treatment with rheumatology Dr. DEstanislado Pandy -She has no symptoms or findings suggestive of multiple myeloma at this time. -will Follow-up on pending labs from today SPEP with [facility] -Continue follow-up with primary care physician and rheumatology.   RTC with Dr KIrene Limbowith labs in 6 months (labs 1 week prior to clinic visit) Flu shot today    No orders of the defined types were placed in this encounter.  The total time spent in the appt was 20 minutes and more than 50% was on counseling and direct patient cares.   GSullivan LoneMD MS AAHIVMS SEdwards County HospitalCWomen[facility] Hospital SystemHematology/Oncology Physician [facility] (Office):       [phone](Work cell):  [phone](Fax):           [phone] I, SBaldwin Jamaica am acting as a scribe for Dr. KIrene Limbo .I have reviewed the above documentation for accuracy and completeness, and I agree with the above. .Brunetta GeneraMD

## 2018-06-20 ENCOUNTER — Telehealth: Payer: Self-pay | Admitting: Hematology

## 2018-06-20 ENCOUNTER — Encounter: Payer: Self-pay | Admitting: Hematology

## 2018-06-20 ENCOUNTER — Inpatient Hospital Stay: Payer: Medicare HMO | Attending: Hematology

## 2018-06-20 ENCOUNTER — Inpatient Hospital Stay: Payer: Medicare HMO | Admitting: Hematology

## 2018-06-20 VITALS — BP 176/67 | HR 75 | Temp 98.1°F | Resp 18 | Ht 59.0 in | Wt 159.1 lb

## 2018-06-20 DIAGNOSIS — D649 Anemia, unspecified: Secondary | ICD-10-CM

## 2018-06-20 DIAGNOSIS — Z23 Encounter for immunization: Secondary | ICD-10-CM | POA: Insufficient documentation

## 2018-06-20 DIAGNOSIS — M109 Gout, unspecified: Secondary | ICD-10-CM

## 2018-06-20 DIAGNOSIS — Z79899 Other long term (current) drug therapy: Secondary | ICD-10-CM | POA: Insufficient documentation

## 2018-06-20 DIAGNOSIS — E119 Type 2 diabetes mellitus without complications: Secondary | ICD-10-CM

## 2018-06-20 DIAGNOSIS — D472 Monoclonal gammopathy: Secondary | ICD-10-CM

## 2018-06-20 DIAGNOSIS — Z7984 Long term (current) use of oral hypoglycemic drugs: Secondary | ICD-10-CM | POA: Insufficient documentation

## 2018-06-20 DIAGNOSIS — D508 Other iron deficiency anemias: Secondary | ICD-10-CM

## 2018-06-20 DIAGNOSIS — Z7982 Long term (current) use of aspirin: Secondary | ICD-10-CM | POA: Diagnosis not present

## 2018-06-20 LAB — CMP (CANCER CENTER ONLY)
ALT: 23 U/L (ref 0–44)
AST: 24 U/L (ref 15–41)
Albumin: 3.9 g/dL (ref 3.5–5.0)
Alkaline Phosphatase: 52 U/L (ref 38–126)
Anion gap: 9 (ref 5–15)
BUN: 15 mg/dL (ref 8–23)
CO2: 27 mmol/L (ref 22–32)
Calcium: 8.9 mg/dL (ref 8.9–10.3)
Chloride: 106 mmol/L (ref 98–111)
Creatinine: 1.08 mg/dL — ABNORMAL HIGH (ref 0.44–1.00)
GFR, Est AFR Am: 58 mL/min — ABNORMAL LOW (ref >60)
GFR, Estimated: 50 mL/min — ABNORMAL LOW (ref >60)
Glucose, Bld: 84 mg/dL (ref 70–99)
Potassium: 4.1 mmol/L (ref 3.5–5.1)
Sodium: 142 mmol/L (ref 135–145)
Total Bilirubin: 0.5 mg/dL (ref 0.3–1.2)
Total Protein: 6.8 g/dL (ref 6.5–8.1)

## 2018-06-20 LAB — IRON AND TIBC
Iron: 55 ug/dL (ref 41–142)
Saturation Ratios: 22 % (ref 21–57)
TIBC: 256 ug/dL (ref 236–444)
UIBC: 201 ug/dL

## 2018-06-20 LAB — CBC WITH DIFFERENTIAL (CANCER CENTER ONLY)
Basophils Absolute: 0 10*3/uL (ref 0.0–0.1)
Basophils Relative: 1 %
Eosinophils Absolute: 0.4 10*3/uL (ref 0.0–0.5)
Eosinophils Relative: 9 %
HCT: 32.7 % — ABNORMAL LOW (ref 34.8–46.6)
Hemoglobin: 10.4 g/dL — ABNORMAL LOW (ref 11.6–15.9)
Lymphocytes Relative: 21 %
Lymphs Abs: 1 10*3/uL (ref 0.9–3.3)
MCH: 30.8 pg (ref 25.1–34.0)
MCHC: 31.8 g/dL (ref 31.5–36.0)
MCV: 96.7 fL (ref 79.5–101.0)
Monocytes Absolute: 0.4 10*3/uL (ref 0.1–0.9)
Monocytes Relative: 9 %
Neutro Abs: 2.7 10*3/uL (ref 1.5–6.5)
Neutrophils Relative %: 60 %
Platelet Count: 148 10*3/uL (ref 145–400)
RBC: 3.38 MIL/uL — ABNORMAL LOW (ref 3.70–5.45)
RDW: 13.8 % (ref 11.2–14.5)
WBC Count: 4.4 10*3/uL (ref 3.9–10.3)

## 2018-06-20 LAB — FERRITIN: Ferritin: 99 ng/mL (ref 11–307)

## 2018-06-20 LAB — RETICULOCYTES
RBC.: 3.38 MIL/uL — ABNORMAL LOW (ref 3.70–5.45)
Retic Count, Absolute: 43.9 10*3/uL (ref 33.7–90.7)
Retic Ct Pct: 1.3 % (ref 0.7–2.1)

## 2018-06-20 MED ORDER — INFLUENZA VAC SPLIT QUAD 0.5 ML IM SUSY
PREFILLED_SYRINGE | INTRAMUSCULAR | Status: AC
  Filled 2018-06-20: qty 0.5

## 2018-06-20 MED ORDER — INFLUENZA VAC SPLIT QUAD 0.5 ML IM SUSY
0.5000 mL | PREFILLED_SYRINGE | Freq: Once | INTRAMUSCULAR | Status: AC
  Administered 2018-06-20: 0.5 mL via INTRAMUSCULAR

## 2018-06-20 MED ORDER — POLYSACCHARIDE IRON COMPLEX 150 MG PO CAPS: 150.0000 mg | ORAL_CAPSULE | Freq: Every day | ORAL | 3 refills | Status: DC

## 2018-06-20 NOTE — Telephone Encounter (Signed)
Appts scheduled AVS/Calendar printed per 9/23 los °

## 2018-06-22 NOTE — Progress Notes (Signed)
Office Visit Note  Patient: Hannah [name]             Date of Birth: 1945-10-30           MRN: [id number]             PCP: Glendale Chard, MD Referring: Glendale Chard, MD Visit Date: 07/06/2018 Occupation: @GUAROCC @  Subjective:  Bilateral knee pain   History of Present Illness: Hannah [name] is a 72 y.o. female with history of seropositive rheumatoid arthritis, systemic lupus erythematosus, gout, osteoporosis, and osteoarthritis.  Patient is on Prolia subcutaneous injections every 6 months for management of osteoporosis.  She is taking allopurinol 100 mg by mouth daily for management of gout.  She is currently also taking Plaquenil 200 mg 1 tablet by mouth daily.  She denies any recent rheumatoid arthritis or lupus flares.  She denies any recent gout flares.  She states that she has a bone density scan ordered at the end of October.  She states she is also ready had her influenza vaccine.  She states that she continues to have pain in bilateral knee joints.  She states that she went to 7 weeks of physical therapy which improved her strength and flexibility but she continues to have stiffness in her right knee and discomfort.  She reports intermittent joint swelling in her knees.  She denies any other joint pain or joint swelling at this time.  She continues to go to [employer] and yoga on a weekly basis.  She denies any sores in mouth or nose.  She denies any rashes.  She has not had any shortness of breath or palpitations. She denies any symptoms of Raynaud's.  She continues to have eye dryness and uses OTC products. She denies any recent low grade fevers or swollen lymph nodes.   Activities of Daily Living:  Patient reports morning stiffness for 30 minutes.   Patient Denies nocturnal pain.  Difficulty dressing/grooming: Denies Difficulty climbing stairs: Reports Difficulty getting out of chair: Reports Difficulty using hands for taps, buttons, cutlery, and/or writing:  Denies  Review of Systems  Constitutional: Positive for fatigue.  HENT: Negative for mouth sores, mouth dryness and nose dryness.   Eyes: Positive for dryness. Negative for pain and visual disturbance.  Respiratory: Negative for cough, hemoptysis, shortness of breath and difficulty breathing.   Cardiovascular: Negative for chest pain, palpitations, hypertension and swelling in legs/feet.  Gastrointestinal: Negative for blood in stool, constipation and diarrhea.  Endocrine: Negative for increased urination.  Genitourinary: Negative for painful urination.  Musculoskeletal: Positive for arthralgias, joint pain and morning stiffness. Negative for joint swelling, myalgias, muscle weakness, muscle tenderness and myalgias.  Skin: Negative for color change, pallor, rash, hair loss, nodules/bumps, skin tightness, ulcers and sensitivity to sunlight.  Allergic/Immunologic: Negative for susceptible to infections.  Neurological: Negative for dizziness, numbness, headaches and weakness.  Hematological: Negative for swollen glands.  Psychiatric/Behavioral: Positive for sleep disturbance. Negative for depressed mood. The patient is not nervous/anxious.     PMFS History:  Patient Active Problem List   Diagnosis Date Noted  . Iron deficiency anemia 08/12/2017  . Osteoporosis 10/02/2016  . Systemic lupus erythematosus ([place]) 10/02/2016  . Rheumatoid arthritis involving multiple sites with positive rheumatoid factor ([place]) 10/02/2016  . High risk medication use 10/02/2016  . History of chronic kidney disease 10/02/2016  . 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/2016  . Primary osteoarthritis of both knees 10/02/2016  . Vitamin D deficiency 10/02/2016  .  History of diabetes mellitus 10/02/2016  . History of hypertension 10/02/2016  . History of asthma 10/02/2016  . Absolute anemia   . MGUS (monoclonal gammopathy of unknown significance)   . Normocytic anemia 03/21/2015  .  Essential hypertension 03/20/2015  . Abnormal CT of the chest 07/30/2012  . Asthma 06/19/2012    Past Medical History:  Diagnosis Date  . Arthritis   . Asthma   . Diabetes mellitus without complication ([place])   . Gout 12/17/2014   patient reported  . Hyperlipidemia   . Hypertension     Family History  Problem Relation Age of Onset  . Heart disease Father   . Diabetes Father   . Hypertension Mother   . Hypertension Sister   . Hypertension Sister   . Anemia Sister    Past Surgical History:  Procedure Laterality Date  . CATARACT EXTRACTION Bilateral 2015  . DILATION AND CURETTAGE OF UTERUS    . DOPPLER ECHOCARDIOGRAPHY  05/2018   Internist to review with pt; potential heart murmur 06/20/18   Social History   Social History Narrative  . Not on file    Objective: Vital Signs: BP (!) 158/72 (BP Location: Left Arm, Patient Position: Sitting, Cuff Size: Large)   Pulse 69   Resp 12   Ht 4\' 11"  (1.499 m)   Wt 160 lb 3.2 oz (72.7 kg)   BMI 32.36 kg/m    Physical Exam  Constitutional: She is oriented to person, place, and time. She appears well-developed and well-nourished.  HENT:  Head: Normocephalic and atraumatic.  No oral nasal ulcerations.  No parotid swelling.  Eyes: Conjunctivae and EOM are normal.  Neck: Normal range of motion.  Cardiovascular: Normal rate, regular rhythm and intact distal pulses.  Murmur heard. Pulmonary/Chest: Effort normal and breath sounds normal.  Abdominal: Soft. Bowel sounds are normal.  Lymphadenopathy:    She has no cervical adenopathy.  Neurological: She is alert and oriented to person, place, and time.  Skin: Skin is warm and dry. Capillary refill takes less than 2 seconds.  No malar rash noted.  No digital ulcerations or signs of gangrene.  Psychiatric: She has a normal mood and affect. Her behavior is normal.  Nursing note and vitals reviewed.    Musculoskeletal Exam: C-spine, thoracic spine, lumbar spine good range of motion.   No midline spinal tenderness.  No SI joint tenderness.  Shoulder joints, elbow joints, wrist joints, MCPs, PIPs, DIPs good range of motion with no synovitis.  She has PIP and DIP synovial thickening consistent with osteoarthritis of bilateral hands.  Ulnar deviation of MCPs,  hip joints good ROM with no discomfort.  No tenderness of trochanteric bursa bilaterally. Right knee slightly limited extension.  She has bilateral knee crepitus.  No warmth or effusion noted.  Ankle joints good range of motion with no discomfort.  No tenderness or warmth of ankle joints noted.  CDAI Exam: CDAI Score: 1  Patient Global Assessment: 5 (mm); Provider Global Assessment: 5 (mm) Swollen: 0 ; Tender: 0  Joint Exam   Not documented   There is currently no information documented on the homunculus. Go to the Rheumatology activity and complete the homunculus joint exam.  Investigation: No additional findings.  Imaging: No results found.  Recent Labs: Lab Results  Component Value Date   WBC 4.4 06/20/2018   HGB 10.4 (L) 06/20/2018   PLT 148 06/20/2018   NA 142 06/20/2018   K 4.1 06/20/2018   CL 106 06/20/2018   CO2  27 06/20/2018   GLUCOSE 84 06/20/2018   BUN 15 06/20/2018   CREATININE 1.08 (H) 06/20/2018   BILITOT 0.5 06/20/2018   ALKPHOS 52 06/20/2018   AST 24 06/20/2018   ALT 23 06/20/2018   PROT 6.8 06/20/2018   ALBUMIN 3.9 06/20/2018   CALCIUM 8.9 06/20/2018   GFRAA 58 (L) 06/20/2018    Speciality Comments: PLQ eye exam: 08/17/2017 Normal. Dr. Clent Jacks. Follow up in 1 year.  Procedures:  No procedures performed Allergies: Shellfish allergy   Assessment / Plan:     Visit Diagnoses: Rheumatoid arthritis involving multiple sites with positive rheumatoid factor (HCC) - Positive RF, positive anti-CCP: She has no synovitis on exam.  She has ulnar deviation of MCP joints.  She has not had any recent rheumatoid arthritis flares.  She has chronic pain in both knee joints due to osteoarthritis.   Due to elevated blood pressure we opted out of cortisone injection today in the office.  She will continue on Plaquenil 200 mg 1 tablet by mouth daily.  She will let us know if she develops any signs or symptoms of a flare.  She will follow-up in the office in 5 months.  Other systemic lupus erythematosus with other organ involvement (HCC) - Positive ANA, positive Smith, positive RNP: She has not had any recent lupus flares.  She is clinically doing well on Plaquenil 200 mg 1 tablet by mouth daily.  She has no synovitis on exam today.  She has not had any recent rashes, oral or nasal ulcerations, Raynaud's, shortness of breath or palpitations.  She continues to have eye dryness and uses over-the-counter eyedrops.  No mouth dryness.  No parotid swelling noted.  No digital ulcerations or signs of gangrene noted.  No cervical lymphadenopathy palpated.  She will continue on Plaquenil 10 mg 1 tablet by mouth daily.  She does not need any refills at this time.  She was encouraged to let us know if she develops any new or worsening symptoms.  She will follow-up in the office in 5 months.  High risk medication use - PLQ. eye exam: 08/17/2017.  CBC and CMP are drawn on 06/20/2018 to monitor for drug toxicity.  Idiopathic chronic gout of multiple sites without tophus -she has not had any recent gout flares.  She is on allopurinol 100 mg by mouth daily.  She does not need any refills at this time.  We will check uric acid level with her next lab work.  She is advised to notify us if she develops a flare. uric acid: 03/07/2018 4.7  Age-related osteoporosis without current pathological fracture - She is receiving Prolia sq injections every 6 months.  She has a repeat DEXA ordered at the end of this month.  T score -3.3, BMD 0.488 07/27/2016 which is decreased by -10% from 07/09/2014.  Primary osteoarthritis of both knees: Chronic pain.  She completed 7 weeks of physical therapy which have helped with muscle  strengthening and flexibility.  She continues to have discomfort and stiffness in her right knee joint.  Due to elevated blood pressure in the office we opted out of performing a cortisone injection today.  Once her blood pressure is better controlled she can return for cortisone injection.  She is encouraged to continue to stay active and exercise on a regular basis.  She has a silver sneakers and yoga on a weekly basis.  History of vitamin D deficiency: She is on vitamin D 1000 units by mouth daily.  Other  medical conditions are listed as follows:  History of asthma  History of anemia  History of diabetes mellitus  History of chronic kidney disease  History of hypertension  MGUS (monoclonal gammopathy of unknown significance)   Orders: No orders of the defined types were placed in this encounter.  No orders of the defined types were placed in this encounter.   Face-to-face time spent with patient was 30 minutes. Greater than 50% of time was spent in counseling and coordination of care.  Follow-Up Instructions: Return in about 5 months (around 12/05/2018) for Rheumatoid arthritis, Systemic lupus erythematosus, Gout, Osteoporosis, Osteoarthritis.   Ofilia Neas, PA-C   I examined and evaluated the patient with Hazel Sams PA.  Patient had no synovitis on my examination.  Her symptoms are quite well controlled on Plaquenil.  She has not had any gout flare.  She will get a bone density this month.  The plan of care was discussed as noted above.  Bo Merino, MD  Note - This record has been created using Editor, commissioning.  Chart creation errors have been sought, but may not always  have been located. Such creation errors do not reflect on  the standard of medical care.

## 2018-06-30 ENCOUNTER — Other Ambulatory Visit: Payer: Self-pay | Admitting: Nurse Practitioner

## 2018-07-06 ENCOUNTER — Ambulatory Visit: Payer: Medicare HMO | Admitting: Rheumatology

## 2018-07-06 ENCOUNTER — Encounter: Payer: Self-pay | Admitting: Rheumatology

## 2018-07-06 VITALS — BP 158/72 | HR 69 | Resp 12 | Ht 59.0 in | Wt 160.2 lb

## 2018-07-06 DIAGNOSIS — M810 Age-related osteoporosis without current pathological fracture: Secondary | ICD-10-CM

## 2018-07-06 DIAGNOSIS — Z8679 Personal history of other diseases of the circulatory system: Secondary | ICD-10-CM

## 2018-07-06 DIAGNOSIS — Z8709 Personal history of other diseases of the respiratory system: Secondary | ICD-10-CM

## 2018-07-06 DIAGNOSIS — M1A09X0 Idiopathic chronic gout, multiple sites, without tophus (tophi): Secondary | ICD-10-CM | POA: Diagnosis not present

## 2018-07-06 DIAGNOSIS — M0579 Rheumatoid arthritis with rheumatoid factor of multiple sites without organ or systems involvement: Secondary | ICD-10-CM

## 2018-07-06 DIAGNOSIS — M3219 Other organ or system involvement in systemic lupus erythematosus: Secondary | ICD-10-CM

## 2018-07-06 DIAGNOSIS — Z862 Personal history of diseases of the blood and blood-forming organs and certain disorders involving the immune mechanism: Secondary | ICD-10-CM

## 2018-07-06 DIAGNOSIS — Z79899 Other long term (current) drug therapy: Secondary | ICD-10-CM

## 2018-07-06 DIAGNOSIS — D472 Monoclonal gammopathy: Secondary | ICD-10-CM

## 2018-07-06 DIAGNOSIS — M170 Bilateral primary osteoarthritis of knee: Secondary | ICD-10-CM | POA: Diagnosis not present

## 2018-07-06 DIAGNOSIS — Z8639 Personal history of other endocrine, nutritional and metabolic disease: Secondary | ICD-10-CM

## 2018-07-06 DIAGNOSIS — Z87448 Personal history of other diseases of urinary system: Secondary | ICD-10-CM

## 2018-07-11 ENCOUNTER — Other Ambulatory Visit: Payer: Self-pay | Admitting: Rheumatology

## 2018-07-11 DIAGNOSIS — M069 Rheumatoid arthritis, unspecified: Secondary | ICD-10-CM

## 2018-07-11 NOTE — Telephone Encounter (Signed)
Last Visit: 07/06/18 Next Visit: 12/06/18 Labs: 06/20/18 stable PLQ eye exam: 08/17/2017 Normal.  Okay to refill per Dr. Estanislado Pandy

## 2018-07-21 ENCOUNTER — Ambulatory Visit (INDEPENDENT_AMBULATORY_CARE_PROVIDER_SITE_OTHER): Payer: Medicare HMO | Admitting: Internal Medicine

## 2018-07-21 ENCOUNTER — Encounter: Payer: Self-pay | Admitting: Internal Medicine

## 2018-07-21 VITALS — BP 122/88 | HR 70 | Temp 98.4°F | Ht 59.0 in | Wt 159.0 lb

## 2018-07-21 DIAGNOSIS — E039 Hypothyroidism, unspecified: Secondary | ICD-10-CM

## 2018-07-21 DIAGNOSIS — N183 Chronic kidney disease, stage 3 unspecified: Secondary | ICD-10-CM

## 2018-07-21 DIAGNOSIS — L821 Other seborrheic keratosis: Secondary | ICD-10-CM

## 2018-07-21 DIAGNOSIS — E1122 Type 2 diabetes mellitus with diabetic chronic kidney disease: Secondary | ICD-10-CM | POA: Diagnosis not present

## 2018-07-21 DIAGNOSIS — I129 Hypertensive chronic kidney disease with stage 1 through stage 4 chronic kidney disease, or unspecified chronic kidney disease: Secondary | ICD-10-CM

## 2018-07-22 LAB — BMP8+EGFR
BUN/Creatinine Ratio: 14 (ref 12–28)
BUN: 14 mg/dL (ref 8–27)
CO2: 22 mmol/L (ref 20–29)
Calcium: 9.6 mg/dL (ref 8.7–10.3)
Chloride: 103 mmol/L (ref 96–106)
Creatinine, Ser: 0.98 mg/dL (ref 0.57–1.00)
GFR calc Af Amer: 67 mL/min/{1.73_m2} (ref >59)
GFR calc non Af Amer: 58 mL/min/{1.73_m2} — ABNORMAL LOW (ref >59)
Glucose: 72 mg/dL (ref 65–99)
Potassium: 4.3 mmol/L (ref 3.5–5.2)
Sodium: 139 mmol/L (ref 134–144)

## 2018-07-22 LAB — HEMOGLOBIN A1C
Est. average glucose Bld gHb Est-mCnc: 111 mg/dL
Hgb A1c MFr Bld: 5.5 % (ref 4.8–5.6)

## 2018-07-22 LAB — TSH: TSH: 0.937 u[IU]/mL (ref 0.450–4.500)

## 2018-07-24 NOTE — Progress Notes (Signed)
Subjective:     Patient ID: Hannah [name] , female    DOB: 06/08/1946 , 72 y.o.   MRN: [id number]   Chief Complaint  Patient presents with  . Hypothyroidism  . Hypertension    HPI  Thyroid Problem  Presents for follow-up visit. The symptoms have been stable.  Hypertension  This is a chronic problem. The current episode started more than 1 year ago. The problem has been gradually improving since onset. The problem is controlled. Pertinent negatives include no blurred vision, chest pain or headaches. Identifiable causes of hypertension include a thyroid problem.  SHE REPORTS COMPLIANCE WITH MEDICATION.    Past Medical History:  Diagnosis Date  . Arthritis   . Asthma   . Diabetes mellitus without complication ([place])   . Gout 12/17/2014   patient reported  . Hyperlipidemia   . Hypertension       Current Outpatient Medications:  .  allopurinol (ZYLOPRIM) 100 MG tablet, Take 100 mg by mouth daily., Disp: , Rfl:  .  amLODipine (NORVASC) 2.5 MG tablet, , Disp: , Rfl:  .  aspirin EC 81 MG tablet, Take 81 mg by mouth daily., Disp: , Rfl:  .  atorvastatin (LIPITOR) 80 MG tablet, Take 80 mg by mouth daily., Disp: , Rfl:  .  budesonide-formoterol (SYMBICORT) 160-4.5 MCG/ACT inhaler, INHALE 2 PUFFS BY MOUTH TWICE DAILY IN THE MORNING AND EVENING, Disp: 10.2 g, Rfl: 0 .  Calcium Carb-Cholecalciferol (CALCIUM 600+D) 600-800 MG-UNIT TABS, Take by mouth 2 (two) times daily. , Disp: , Rfl:  .  Cholecalciferol (VITAMIN D PO), Take 1,000 Units by mouth daily. , Disp: , Rfl:  .  hydroxychloroquine (PLAQUENIL) 200 MG tablet, TAKE 1 TABLET(200 MG) BY MOUTH DAILY, Disp: 90 tablet, Rfl: 0 .  Hypromellose (ARTIFICIAL TEARS OP), Apply to eye as needed., Disp: , Rfl:  .  irbesartan (AVAPRO) 300 MG tablet, Take 300 mg daily by mouth., Disp: , Rfl:  .  iron polysaccharides (NIFEREX) 150 MG capsule, Take 1 capsule (150 mg total) by mouth daily., Disp: 60 capsule, Rfl: 3 .  levothyroxine (SYNTHROID,  LEVOTHROID) 88 MCG tablet, Take 88 mcg by mouth daily before breakfast. Monday-Saturday; Skip Sunday, Disp: , Rfl:  .  LUBRICANT EYE DROPS 0.4-0.3 % SOLN, , Disp: , Rfl:  .  metFORMIN (GLUCOPHAGE) 500 MG tablet, Take 500 mg daily by mouth. , Disp: , Rfl:  .  PROLIA 60 MG/ML SOSY injection, INJECT 60 MG INTO THE SKIN EVERY 6 (SIX) MONTHS. ADMINISTER IN UPPER ARM, THIGH, OR ABDOMEN, Disp: 1 mL, Rfl: 0 .  VENTOLIN HFA 108 (90 Base) MCG/ACT inhaler, INL 2 PFS PO Q 4 TO 6 H PRN, Disp: , Rfl: 5   Allergies  Allergen Reactions  . Shellfish Allergy Anaphylaxis and Hives     Review of Systems  Constitutional: Negative.   Eyes: Negative.  Negative for blurred vision.  Respiratory: Negative.   Cardiovascular: Negative.  Negative for chest pain.  Gastrointestinal: Negative.   Neurological: Negative.  Negative for headaches.     Today's Vitals   07/21/18 1131  BP: 122/88  Pulse: 70  Temp: 98.4 F (36.9 C)  TempSrc: Oral  Weight: 159 lb (72.1 kg)  Height: _0  (1.499 m)  PainSc: 4   PainLoc: Knee   Body mass index is 32.11 kg/m.   Objective:  Physical Exam  Constitutional: She is oriented to person, place, and time. She appears well-developed and well-nourished.  HENT:  Head: Normocephalic and atraumatic.  Eyes:  EOM are normal.  Cardiovascular: Normal rate and regular rhythm.  Murmur heard. Pulmonary/Chest: Effort normal and breath sounds normal.  Neurological: She is alert and oriented to person, place, and time.  Skin:  SCATTERED HYPERPIGMENTED STUCK-ON PLAQUES ON FACE/NECK  Psychiatric: She has a normal mood and affect.  Nursing note and vitals reviewed.       Assessment And Plan:     1. Primary hypothyroidism  I WILL CHECK LABS AND ADJUST MEDS AS NEEDED. IMPORTANCE OF MEDICATION COMPLIANCE WAS DISCUSSED WITH THE PATIENT.    - TSH  2. Seborrheic keratoses  CHRONIC.   3. Hypertensive nephropathy  FAIR CONTROL. SHE WILL CONTINUE WITH CURRENT MEDS FOR NOW. SHE IS  ENCOURAGED TO EXERCISE NO LESS THAN FOUR TO FIVE DAYS WEEKLY FOR AT LEAST 30 MINUTES. I ALSO REVIEWED HER ECHO RESULTS IN FULL DETAIL DURING HER VISIT. ALL QUESTIONS WERE ANSWERED TO HER SATISFACTION.   4. Diabetes mellitus with stage 3 chronic kidney disease ([place])  I WILL CHECK LABS AS LISTED BELOW. IMPORTANCE OF DIETARY, MEDICATION COMPLIANCE WAS DISCUSSED WITH THE PATIENT.   - Hemoglobin A1c - BMP8+EGFR  5. Chronic renal disease, stage III (HCC)  CHRONIC, YET STABLE. SHE IS ALSO FOLLOWED BY NEPHROLOGY. SHE IS ENCOURAGED TO STAY WELL HYDRATED.   [name], MD

## 2018-07-25 ENCOUNTER — Other Ambulatory Visit: Payer: Self-pay | Admitting: Internal Medicine

## 2018-07-25 NOTE — Progress Notes (Signed)
Here are your lab results:  Your thyroid function is stable. Continue with current meds. She is encouraged to avoid adding salt to her foods. Your hba1c is 5.5, this is GREAT! . Your kidney function improved to stage 2.    This is great! Please let me know if you have any questions.   Sincerely,    Lorine Iannaccone N. Baird Cancer, MD

## 2018-07-26 ENCOUNTER — Other Ambulatory Visit: Payer: Self-pay | Admitting: Internal Medicine

## 2018-07-28 DIAGNOSIS — M810 Age-related osteoporosis without current pathological fracture: Secondary | ICD-10-CM | POA: Diagnosis not present

## 2018-07-28 DIAGNOSIS — J45909 Unspecified asthma, uncomplicated: Secondary | ICD-10-CM | POA: Diagnosis not present

## 2018-07-28 LAB — HM DEXA SCAN

## 2018-08-05 ENCOUNTER — Telehealth: Payer: Self-pay | Admitting: *Deleted

## 2018-08-05 NOTE — Telephone Encounter (Signed)
Bone Density Scan shows Osteoporosis T-Score -2.7 On Prolia Per Dr. Estanislado Pandy Improved and continue current treatment

## 2018-08-12 ENCOUNTER — Encounter: Payer: Self-pay | Admitting: Internal Medicine

## 2018-08-15 NOTE — Telephone Encounter (Signed)
Attempted to contact patient and left message for patient to call the office.  

## 2018-08-15 NOTE — Telephone Encounter (Signed)
Patient advised Osteoporosis but has improved since last scan. Patient advised to stay on current treatment of Prolia.

## 2018-08-17 DIAGNOSIS — E119 Type 2 diabetes mellitus without complications: Secondary | ICD-10-CM | POA: Diagnosis not present

## 2018-08-17 DIAGNOSIS — Z961 Presence of intraocular lens: Secondary | ICD-10-CM | POA: Diagnosis not present

## 2018-08-17 DIAGNOSIS — H26493 Other secondary cataract, bilateral: Secondary | ICD-10-CM | POA: Diagnosis not present

## 2018-08-17 DIAGNOSIS — H04123 Dry eye syndrome of bilateral lacrimal glands: Secondary | ICD-10-CM | POA: Diagnosis not present

## 2018-08-17 DIAGNOSIS — Z09 Encounter for follow-up examination after completed treatment for conditions other than malignant neoplasm: Secondary | ICD-10-CM | POA: Diagnosis not present

## 2018-08-17 DIAGNOSIS — Z049 Encounter for examination and observation for unspecified reason: Secondary | ICD-10-CM | POA: Diagnosis not present

## 2018-08-17 LAB — HM DIABETES EYE EXAM

## 2018-08-24 ENCOUNTER — Ambulatory Visit: Payer: Medicare HMO | Admitting: Obstetrics & Gynecology

## 2018-08-24 ENCOUNTER — Encounter: Payer: Self-pay | Admitting: Obstetrics & Gynecology

## 2018-08-24 VITALS — BP 144/90 | Ht <= 58 in | Wt 158.6 lb

## 2018-08-24 DIAGNOSIS — E6609 Other obesity due to excess calories: Secondary | ICD-10-CM

## 2018-08-24 DIAGNOSIS — Z9189 Other specified personal risk factors, not elsewhere classified: Secondary | ICD-10-CM | POA: Diagnosis not present

## 2018-08-24 DIAGNOSIS — Z01419 Encounter for gynecological examination (general) (routine) without abnormal findings: Secondary | ICD-10-CM

## 2018-08-24 DIAGNOSIS — Z7251 High risk heterosexual behavior: Secondary | ICD-10-CM

## 2018-08-24 DIAGNOSIS — Z780 Asymptomatic menopausal state: Secondary | ICD-10-CM

## 2018-08-24 DIAGNOSIS — M810 Age-related osteoporosis without current pathological fracture: Secondary | ICD-10-CM

## 2018-08-24 DIAGNOSIS — E66811 Obesity, class 1: Secondary | ICD-10-CM

## 2018-08-24 DIAGNOSIS — Z6833 Body mass index (BMI) 33.0-33.9, adult: Secondary | ICD-10-CM

## 2018-08-24 LAB — HM PAP SMEAR: HM Pap smear: NORMAL

## 2018-08-24 NOTE — Progress Notes (Signed)
Hannah [name] 1946/06/25 [id number]   History:    72 y.o. G1P1L0  Single.  Very active with her sister at [facility].  RP:  Established patient presenting for annual gyn exam   HPI:  Menopause. No HRT.  No PMB.  No pelvic pain.  Breasts wnl.  Urine/BMs wnl.  BMI 33.15.  Lost 11 Lbs since last year.  Exercising regularly, healthy nutrition.  Per patient, needs knee replacement. Chronic Anemia, will see Hemato tomorrow.  No recent skin exam, many moles, seen by Dermato, all benign appearance.  Fam MD Dr Baird Cancer for DM, cHTN, Asthma...   Past medical history,surgical history, family history and social history were all reviewed and documented in the EPIC chart.  Gynecologic History No LMP recorded. Patient is postmenopausal. Contraception: abstinence and post menopausal status Last Pap: 2017. Results were: Negative Last mammogram: 11/2017. Results were: Negative Bone Density: 06/2018 Osteoporosis Rt Femoral neck -2.7.  Improved on Prolia. Colonoscopy: 2014  Obstetric History OB History  Gravida Para Term Preterm AB Living  1 1       0  SAB TAB Ectopic Multiple Live Births               # Outcome Date GA Lbr Len/2nd Weight Sex Delivery Anes PTL Lv  1 Para             Obstetric Comments  Baby died from CHF     ROS: A ROS was performed and pertinent positives and negatives are included in the history.  GENERAL: No fevers or chills. HEENT: No change in vision, no earache, sore throat or sinus congestion. NECK: No pain or stiffness. CARDIOVASCULAR: No chest pain or pressure. No palpitations. PULMONARY: No shortness of breath, cough or wheeze. GASTROINTESTINAL: No abdominal pain, nausea, vomiting or diarrhea, melena or bright red blood per rectum. GENITOURINARY: No urinary frequency, urgency, hesitancy or dysuria. MUSCULOSKELETAL: No joint or muscle pain, no back pain, no recent trauma. DERMATOLOGIC: No rash, no itching, no lesions. ENDOCRINE: No polyuria, polydipsia, no heat or cold  intolerance. No recent change in weight. HEMATOLOGICAL: No anemia or easy bruising or bleeding. NEUROLOGIC: No headache, seizures, numbness, tingling or weakness. PSYCHIATRIC: No depression, no loss of interest in normal activity or change in sleep pattern.     Exam:   BP (!) 144/90   Ht 4\' 10"  (1.473 m)   Wt 158 lb 9.6 oz (71.9 kg)   BMI 33.15 kg/m   Body mass index is 33.15 kg/m.  General appearance : Well developed well nourished female. No acute distress HEENT: Eyes: no retinal hemorrhage or exudates,  Neck supple, trachea midline, no carotid bruits, no thyroidmegaly Lungs: Clear to auscultation, no rhonchi or wheezes, or rib retractions  Heart: Regular rate and rhythm, no murmurs or gallops Breast:Examined in sitting and supine position were symmetrical in appearance, no palpable masses or tenderness,  no skin retraction, no nipple inversion, no nipple discharge, no skin discoloration, no axillary or supraclavicular lymphadenopathy Abdomen: no palpable masses or tenderness, no rebound or guarding Extremities: no edema or skin discoloration or tenderness  Pelvic: Vulva: Normal             Vagina: No gross lesions or discharge  Cervix: No gross lesions or discharge  Uterus  AV, normal size, shape and consistency, non-tender and mobile  Adnexa  Without masses or tenderness  Anus: Normal   Assessment/Plan:  72 y.o. female for annual exam   1. Well female exam with routine gynecological exam  Normal gynecologic exam and menopause.  No indication to repeat a Pap test this year.  Breast exam normal.  Screening mammogram was negative in March 2019.  Health labs with family physician.  Followed by hematology for anemia.  Colonoscopy in 2014.  2. Postmenopausal Well on no hormone replacement therapy.  No postmenopausal bleeding.  3. Age-related osteoporosis without current pathological fracture Osteoporosis improved on Prolia.  Continue with vitamin D supplements, calcium intake of  1.5 g/day and weightbearing physical activity regularly.  4. Class 1 obesity due to excess calories with serious comorbidity and body mass index (BMI) of 33.0 to 33.9 in adult Recommend lower calorie/carb diet such as [employer].  Continue with aerobic physical activities 5 times a week and weightlifting every 2 days.  Princess Bruins MD, 9:49 AM 08/24/2018

## 2018-08-24 NOTE — Patient Instructions (Signed)
.   Well female exam with routine gynecological exam Normal gynecologic exam and menopause.  No indication to repeat a Pap test this year.  Breast exam normal.  Screening mammogram was negative in March 2019.  Health labs with family physician.  Followed by hematology for anemia.  Colonoscopy in 2014.  2. Postmenopausal Well on no hormone replacement therapy.  No postmenopausal bleeding.  3. Age-related osteoporosis without current pathological fracture Osteoporosis improved on Prolia.  Continue with vitamin D supplements, calcium intake of 1.5 g/day and weightbearing physical activity regularly.  4. Class 1 obesity due to excess calories with serious comorbidity and body mass index (BMI) of 33.0 to 33.9 in adult Recommend lower calorie/carb diet such as [employer].  Continue with aerobic physical activities 5 times a week and weightlifting every 2 days.  Hannah [name], it was a pleasure seeing you today!

## 2018-08-29 ENCOUNTER — Other Ambulatory Visit: Payer: Self-pay | Admitting: Internal Medicine

## 2018-08-31 ENCOUNTER — Other Ambulatory Visit: Payer: Self-pay | Admitting: Internal Medicine

## 2018-09-02 ENCOUNTER — Other Ambulatory Visit: Payer: Self-pay | Admitting: Nurse Practitioner

## 2018-09-02 DIAGNOSIS — J4520 Mild intermittent asthma, uncomplicated: Secondary | ICD-10-CM

## 2018-09-02 MED ORDER — ALBUTEROL SULFATE HFA 108 (90 BASE) MCG/ACT IN AERS: 2.0000 | INHALATION_SPRAY | Freq: Four times a day (QID) | RESPIRATORY_TRACT | 2 refills | Status: DC | PRN

## 2018-09-05 ENCOUNTER — Encounter: Payer: Medicare HMO | Admitting: Internal Medicine

## 2018-10-06 ENCOUNTER — Telehealth: Payer: Self-pay | Admitting: Pharmacist

## 2018-10-06 NOTE — Telephone Encounter (Signed)
Busy tone. Courtesy call that Prolia injection is due 10/27/18.

## 2018-10-11 ENCOUNTER — Other Ambulatory Visit: Payer: Self-pay | Admitting: Rheumatology

## 2018-10-11 DIAGNOSIS — M069 Rheumatoid arthritis, unspecified: Secondary | ICD-10-CM

## 2018-10-11 NOTE — Telephone Encounter (Signed)
Last Visit: 07/06/18 Next Visit: 12/06/18 Labs: 06/20/18 stable PLQ eye exam: 08/17/2018 Normal.  Okay to refill per Dr. Estanislado Pandy

## 2018-10-14 ENCOUNTER — Other Ambulatory Visit: Payer: Self-pay | Admitting: Rheumatology

## 2018-10-14 NOTE — Telephone Encounter (Signed)
Patient advised she is due to come in for labs 10 days prior to injection. Patient will come in 10/24/18 as she will be out of town next week. Will send prescription after labs are performed.

## 2018-10-18 ENCOUNTER — Other Ambulatory Visit: Payer: Self-pay | Admitting: Rheumatology

## 2018-10-18 ENCOUNTER — Encounter: Payer: Self-pay | Admitting: Internal Medicine

## 2018-10-25 ENCOUNTER — Ambulatory Visit (INDEPENDENT_AMBULATORY_CARE_PROVIDER_SITE_OTHER): Payer: Medicare HMO | Admitting: Internal Medicine

## 2018-10-25 ENCOUNTER — Encounter: Payer: Self-pay | Admitting: Internal Medicine

## 2018-10-25 VITALS — BP 146/80 | HR 83 | Temp 98.3°F | Ht <= 58 in | Wt 161.2 lb

## 2018-10-25 DIAGNOSIS — M79671 Pain in right foot: Secondary | ICD-10-CM | POA: Diagnosis not present

## 2018-10-25 DIAGNOSIS — E1122 Type 2 diabetes mellitus with diabetic chronic kidney disease: Secondary | ICD-10-CM

## 2018-10-25 DIAGNOSIS — N183 Chronic kidney disease, stage 3 unspecified: Secondary | ICD-10-CM

## 2018-10-25 DIAGNOSIS — I129 Hypertensive chronic kidney disease with stage 1 through stage 4 chronic kidney disease, or unspecified chronic kidney disease: Secondary | ICD-10-CM | POA: Diagnosis not present

## 2018-10-25 DIAGNOSIS — M170 Bilateral primary osteoarthritis of knee: Secondary | ICD-10-CM | POA: Diagnosis not present

## 2018-10-25 DIAGNOSIS — L821 Other seborrheic keratosis: Secondary | ICD-10-CM

## 2018-10-25 DIAGNOSIS — M79672 Pain in left foot: Secondary | ICD-10-CM

## 2018-10-25 DIAGNOSIS — Z0000 Encounter for general adult medical examination without abnormal findings: Secondary | ICD-10-CM

## 2018-10-25 DIAGNOSIS — E039 Hypothyroidism, unspecified: Secondary | ICD-10-CM | POA: Diagnosis not present

## 2018-10-25 LAB — POCT URINALYSIS DIPSTICK
Bilirubin, UA: NEGATIVE
Blood, UA: NEGATIVE
Glucose, UA: NEGATIVE
Ketones, UA: NEGATIVE
Nitrite, UA: NEGATIVE
Protein, UA: POSITIVE — AB
Spec Grav, UA: >=1.03 — AB (ref 1.010–1.025)
Urobilinogen, UA: 0.2 E.U./dL
pH, UA: 5.5 (ref 5.0–8.0)

## 2018-10-25 LAB — POCT UA - MICROALBUMIN
Creatinine, POC: 200 mg/dL
Microalbumin Ur, POC: 150 mg/L

## 2018-10-25 MED ORDER — IRBESARTAN 300 MG PO TABS: 300.0000 mg | ORAL_TABLET | Freq: Every day | ORAL | 2 refills | Status: DC

## 2018-10-25 NOTE — Patient Instructions (Signed)
Diabetes Mellitus and Foot Care  Foot care is an important part of your health, especially when you have diabetes. Diabetes may cause you to have problems because of poor blood flow (circulation) to your feet and legs, which can cause your skin to:   Become thinner and drier.   Break more easily.   Heal more slowly.   Peel and crack.  You may also have nerve damage (neuropathy) in your legs and feet, causing decreased feeling in them. This means that you may not notice minor injuries to your feet that could lead to more serious problems. Noticing and addressing any potential problems early is the best way to prevent future foot problems.  How to care for your feet  Foot hygiene   Wash your feet daily with warm water and mild soap. Do not use hot water. Then, pat your feet and the areas between your toes until they are completely dry. Do not soak your feet as this can dry your skin.   Trim your toenails straight across. Do not dig under them or around the cuticle. File the edges of your nails with an emery board or nail file.   Apply a moisturizing lotion or petroleum jelly to the skin on your feet and to dry, brittle toenails. Use lotion that does not contain alcohol and is unscented. Do not apply lotion between your toes.  Shoes and socks   Wear clean socks or stockings every day. Make sure they are not too tight. Do not wear knee-high stockings since they may decrease blood flow to your legs.   Wear shoes that fit properly and have enough cushioning. Always look in your shoes before you put them on to be sure there are no objects inside.   To break in new shoes, wear them for just a few hours a day. This prevents injuries on your feet.  Wounds, scrapes, corns, and calluses   Check your feet daily for blisters, cuts, bruises, sores, and redness. If you cannot see the bottom of your feet, use a mirror or ask someone for help.   Do not cut corns or calluses or try to remove them with medicine.   If you  find a minor scrape, cut, or break in the skin on your feet, keep it and the skin around it clean and dry. You may clean these areas with mild soap and water. Do not clean the area with peroxide, alcohol, or iodine.   If you have a wound, scrape, corn, or callus on your foot, look at it several times a day to make sure it is healing and not infected. Check for:  ? Redness, swelling, or pain.  ? Fluid or blood.  ? Warmth.  ? Pus or a bad smell.  General instructions   Do not cross your legs. This may decrease blood flow to your feet.   Do not use heating pads or hot water bottles on your feet. They may burn your skin. If you have lost feeling in your feet or legs, you may not know this is happening until it is too late.   Protect your feet from hot and cold by wearing shoes, such as at the beach or on hot pavement.   Schedule a complete foot exam at least once a year (annually) or more often if you have foot problems. If you have foot problems, report any cuts, sores, or bruises to your health care provider immediately.  Contact a health care provider if:     You have a medical condition that increases your risk of infection and you have any cuts, sores, or bruises on your feet.   You have an injury that is not healing.   You have redness on your legs or feet.   You feel burning or tingling in your legs or feet.   You have pain or cramps in your legs and feet.   Your legs or feet are numb.   Your feet always feel cold.   You have pain around a toenail.  Get help right away if:   You have a wound, scrape, corn, or callus on your foot and:  ? You have pain, swelling, or redness that gets worse.  ? You have fluid or blood coming from the wound, scrape, corn, or callus.  ? Your wound, scrape, corn, or callus feels warm to the touch.  ? You have pus or a bad smell coming from the wound, scrape, corn, or callus.  ? You have a fever.  ? You have a red line going up your leg.  Summary   Check your feet every day  for cuts, sores, red spots, swelling, and blisters.   Moisturize feet and legs daily.   Wear shoes that fit properly and have enough cushioning.   If you have foot problems, report any cuts, sores, or bruises to your health care provider immediately.   Schedule a complete foot exam at least once a year (annually) or more often if you have foot problems.  This information is not intended to replace advice given to you by your health care provider. Make sure you discuss any questions you have with your health care provider.  Document Released: 09/11/2000 Document Revised: 10/27/2017 Document Reviewed: 10/16/2016  Elsevier Interactive Patient Education  2019 Elsevier Inc[employer]

## 2018-10-25 NOTE — Progress Notes (Signed)
Subjective:     Patient ID: Hannah [name] , female    DOB: 1946-03-23 , 73 y.o.   MRN: [id number]   Chief Complaint  Patient presents with  . Annual Exam  . Diabetes  . Hypertension    HPI  She is here today for a full physical examination. She would like to have several things addressed today. First, she needs refill of irbesartan.  Secondly, she would like referral to dermatologist for evaluation of skin lesions. Lastly, she needs referral to podiatrist for evaluation of foot pain.   Diabetes  She presents for her follow-up diabetic visit. She has type 2 diabetes mellitus. Her disease course has been stable. There are no hypoglycemic associated symptoms. There are no diabetic associated symptoms. Pertinent negatives for diabetes include no blurred vision and no chest pain. There are no hypoglycemic complications. Diabetic complications include nephropathy. Risk factors for coronary artery disease include diabetes mellitus, dyslipidemia, hypertension, obesity and post-menopausal. Her weight is fluctuating minimally. She is following a diabetic diet. She participates in exercise three times a week. Her breakfast blood glucose is taken between 8-9 am. Her breakfast blood glucose range is generally 110-130 mg/dl. She does not see a podiatrist.Eye exam is current.  Hypertension  This is a chronic problem. The current episode started more than 1 year ago. The problem has been gradually improving since onset. The problem is uncontrolled. Pertinent negatives include no blurred vision, chest pain, palpitations or shortness of breath.   She reports compliance with meds.   Past Medical History:  Diagnosis Date  . Arthritis   . Asthma   . Diabetes mellitus without complication ([place])   . Gout 12/17/2014   patient reported  . Hyperlipidemia   . Hypertension      Family History  Problem Relation Age of Onset  . Heart disease Father   . Diabetes Father   . Hypertension Mother   .  Hypertension Sister   . Hypertension Sister   . Anemia Sister      Current Outpatient Medications:  .  albuterol (PROVENTIL HFA;VENTOLIN HFA) 108 (90 Base) MCG/ACT inhaler, Inhale 2 puffs into the lungs every 6 (six) hours as needed for wheezing or shortness of breath., Disp: 1 Inhaler, Rfl: 2 .  allopurinol (ZYLOPRIM) 100 MG tablet, Take 100 mg by mouth daily., Disp: , Rfl:  .  amLODipine (NORVASC) 2.5 MG tablet, TAKE 1 TABLET BY MOUTH EVERY DAY, Disp: 90 tablet, Rfl: 0 .  aspirin EC 81 MG tablet, Take 81 mg by mouth daily., Disp: , Rfl:  .  atorvastatin (LIPITOR) 80 MG tablet, Take 80 mg by mouth daily., Disp: , Rfl:  .  budesonide-formoterol (SYMBICORT) 160-4.5 MCG/ACT inhaler, INHALE 2 PUFFS BY MOUTH TWICE DAILY IN THE MORNING AND EVENING, Disp: 10.2 g, Rfl: 0 .  Calcium Carb-Cholecalciferol (CALCIUM 600+D) 600-800 MG-UNIT TABS, Take by mouth 2 (two) times daily. , Disp: , Rfl:  .  Cholecalciferol (VITAMIN D PO), Take 1,000 Units by mouth daily. , Disp: , Rfl:  .  hydroxychloroquine (PLAQUENIL) 200 MG tablet, TAKE 1 TABLET(200 MG) BY MOUTH DAILY, Disp: 90 tablet, Rfl: 0 .  Hypromellose (ARTIFICIAL TEARS OP), Apply to eye as needed., Disp: , Rfl:  .  irbesartan (AVAPRO) 300 MG tablet, TAKE 1 TABLET BY MOUTH EVERY DAY, Disp: 90 tablet, Rfl: 0 .  iron polysaccharides (NIFEREX) 150 MG capsule, Take 1 capsule (150 mg total) by mouth daily., Disp: 60 capsule, Rfl: 3 .  LUBRICANT EYE DROPS 0.4-0.3 %  SOLN, , Disp: , Rfl:  .  metFORMIN (GLUCOPHAGE) 500 MG tablet, Take 500 mg daily by mouth. , Disp: , Rfl:  .  PROLIA 60 MG/ML SOSY injection, INJECT 60 MG INTO THE SKIN EVERY 6 (SIX) MONTHS. ADMINISTER IN UPPER ARM, THIGH, OR ABDOMEN, Disp: 1 mL, Rfl: 0 .  SYNTHROID 88 MCG tablet, TAKE 1 TABLET BY MOUTH EVERY DAY MONDAY TO SATURDAY AND OFF ON SUNDAY., Disp: 30 tablet, Rfl: 0   Allergies  Allergen Reactions  . Shellfish Allergy Anaphylaxis and Hives      No LMP recorded. Patient is  postmenopausal.. . Negative for: breast discharge, breast lump(s), breast pain and breast self exam. Associated symptoms include abnormal vaginal bleeding. Pertinent negatives include abnormal bleeding (hematology), anxiety, decreased libido, depression, difficulty falling sleep, dyspareunia, history of infertility, nocturia, sexual dysfunction, sleep disturbances, urinary incontinence, urinary urgency, vaginal discharge and vaginal itching. Diet regular.The patient states her exercise level is  regular - 3 days weekly gym/3 days walking.   . The patient's tobacco use is:  Social History   Tobacco Use  Smoking Status Never Smoker  Smokeless Tobacco Never Used  . She has been exposed to passive smoke. The patient's alcohol use is:  Social History   Substance and Sexual Activity  Alcohol Use Yes   Comment: rarely    Review of Systems  Constitutional: Negative.   HENT: Negative.   Eyes: Negative.  Negative for blurred vision.  Respiratory: Negative.  Negative for shortness of breath.   Cardiovascular: Negative.  Negative for chest pain and palpitations.  Gastrointestinal: Negative.   Endocrine: Negative.   Genitourinary: Negative.   Musculoskeletal: Negative.   Skin: Negative.   Allergic/Immunologic: Negative.   Neurological: Negative.   Hematological: Negative.   Psychiatric/Behavioral: Negative.      Today's Vitals   10/25/18 0951  BP: (!) 146/80  Pulse: 83  Temp: 98.3 F (36.8 C)  TempSrc: Oral  Weight: 161 lb 3.2 oz (73.1 kg)  Height: 4' 9.2" (1.453 m)  PainSc: 0-No pain   Body mass index is 34.64 kg/m.   Objective:  Physical Exam Vitals signs and nursing note reviewed.  Constitutional:      Appearance: Normal appearance. She is obese.  HENT:     Head: Normocephalic and atraumatic.     Right Ear: Tympanic membrane, ear canal and external ear normal.     Left Ear: Tympanic membrane, ear canal and external ear normal.     Nose: Nose normal.     Mouth/Throat:      Mouth: Mucous membranes are moist.     Pharynx: Oropharynx is clear.  Eyes:     Extraocular Movements: Extraocular movements intact.     Conjunctiva/sclera: Conjunctivae normal.     Pupils: Pupils are equal, round, and reactive to light.  Neck:     Musculoskeletal: Normal range of motion and neck supple.  Cardiovascular:     Rate and Rhythm: Normal rate and regular rhythm.     Pulses: Normal pulses.          Dorsalis pedis pulses are 2+ on the right side and 2+ on the left side.     Heart sounds: Murmur present.  Pulmonary:     Effort: Pulmonary effort is normal.     Breath sounds: Normal breath sounds.  Abdominal:     General: Abdomen is flat. Bowel sounds are normal.  Genitourinary:    Comments: deferred Musculoskeletal: Normal range of motion.  Feet:     Right  foot:     Protective Sensation: 5 sites tested. 5 sites sensed.     Skin integrity: Skin integrity normal.     Toenail Condition: Right toenails are normal.     Left foot:     Protective Sensation: 5 sites tested. 5 sites sensed.     Skin integrity: Skin integrity normal.     Toenail Condition: Left toenails are normal.  Skin:    General: Skin is warm and dry.     Capillary Refill: Capillary refill takes less than 2 seconds.     Findings: Lesion present.     Comments: Scattered, hyperpigmented, hyperkeratotic stuck on plaques- located on neck/anterior chest/back - some have slight erythema surrounding the lesions. Notably on anterior chest  Neurological:     General: No focal deficit present.     Mental Status: She is alert and oriented to person, place, and time.  Psychiatric:        Mood and Affect: Mood normal.        Behavior: Behavior normal.         Assessment And Plan:     1. Routine general medical examination at health care facility  A full exam was performed.  Importance of monthly self breast exams was discussed with the patient.  PATIENT HAS BEEN ADVISED TO GET 30-45 MINUTES REGULAR EXERCISE NO  LESS THAN FOUR TO FIVE DAYS PER WEEK - BOTH WEIGHTBEARING EXERCISES AND AEROBIC ARE RECOMMENDED.  SHE IS ADVISED TO FOLLOW A HEALTHY DIET WITH AT LEAST SIX FRUITS/VEGGIES PER DAY, DECREASE INTAKE OF RED MEAT, AND TO INCREASE FISH INTAKE TO TWO DAYS PER WEEK.  MEATS/FISH SHOULD NOT BE FRIED, BAKED OR BROILED IS PREFERABLE.  I SUGGEST WEARING SPF 50 SUNSCREEN ON EXPOSED PARTS AND ESPECIALLY WHEN IN THE DIRECT SUNLIGHT FOR AN EXTENDED PERIOD OF TIME.  PLEASE AVOID FAST FOOD RESTAURANTS AND INCREASE YOUR WATER INTAKE.   2. Diabetes mellitus with stage 3 chronic kidney disease ([place])  Diabetic foot exam was performed.  I DISCUSSED WITH THE PATIENT AT LENGTH REGARDING THE GOALS OF GLYCEMIC CONTROL AND POSSIBLE LONG-TERM COMPLICATIONS.  I  ALSO STRESSED THE IMPORTANCE OF COMPLIANCE WITH HOME GLUCOSE MONITORING, DIETARY RESTRICTIONS INCLUDING AVOIDANCE OF SUGARY DRINKS/PROCESSED FOODS,  ALONG WITH REGULAR EXERCISE.  I  ALSO STRESSED THE IMPORTANCE OF ANNUAL EYE EXAMS, SELF FOOT CARE AND COMPLIANCE WITH OFFICE VISITS.  - CMP14+EGFR - CBC - Lipid panel - Hemoglobin A1c - POCT Urinalysis Dipstick (81002) - POCT UA - Microalbumin  3. Hypertensive nephropathy  Fair control. She will continue with current meds. She is encouraged to avoid adding salt to her foods.  EKG performed, no new changes noted.  - EKG 12-Lead  4. Primary hypothyroidism  I will check a thyroid panel and adjust meds as needed.  - TSH - T4, Free  5. Primary osteoarthritis of both knees  Chronic. She does not wish to have surgery at this time. She is encouraged to engage in regular exercise and to consider aqua exercises.   6. Pain in both feet  I will refer her to [facility], Dr. Paulla Dolly as requested. She wants to see the same provider her sister has seen in the past.   7. Seborrheic keratoses  I will refer her to [facility] as requested. Some of these lesions are irritated and she wants to have them removed.    [name], MD

## 2018-10-26 LAB — HEMOGLOBIN A1C
Est. average glucose Bld gHb Est-mCnc: 108 mg/dL
Hgb A1c MFr Bld: 5.4 % (ref 4.8–5.6)

## 2018-10-26 LAB — CMP14+EGFR
ALT: 16 IU/L (ref 0–32)
AST: 22 IU/L (ref 0–40)
Albumin/Globulin Ratio: 1.8 (ref 1.2–2.2)
Albumin: 4.4 g/dL (ref 3.7–4.7)
Alkaline Phosphatase: 53 IU/L (ref 39–117)
BUN/Creatinine Ratio: 15 (ref 12–28)
BUN: 18 mg/dL (ref 8–27)
Bilirubin Total: 0.7 mg/dL (ref 0.0–1.2)
CO2: 23 mmol/L (ref 20–29)
Calcium: 9.9 mg/dL (ref 8.7–10.3)
Chloride: 101 mmol/L (ref 96–106)
Creatinine, Ser: 1.23 mg/dL — ABNORMAL HIGH (ref 0.57–1.00)
GFR calc Af Amer: 50 mL/min/{1.73_m2} — ABNORMAL LOW (ref >59)
GFR calc non Af Amer: 44 mL/min/{1.73_m2} — ABNORMAL LOW (ref >59)
Globulin, Total: 2.5 g/dL (ref 1.5–4.5)
Glucose: 87 mg/dL (ref 65–99)
Potassium: 4.5 mmol/L (ref 3.5–5.2)
Sodium: 141 mmol/L (ref 134–144)
Total Protein: 6.9 g/dL (ref 6.0–8.5)

## 2018-10-26 LAB — CBC
Hematocrit: 31.8 % — ABNORMAL LOW (ref 34.0–46.6)
Hemoglobin: 10.7 g/dL — ABNORMAL LOW (ref 11.1–15.9)
MCH: 30.9 pg (ref 26.6–33.0)
MCHC: 33.6 g/dL (ref 31.5–35.7)
MCV: 92 fL (ref 79–97)
Platelets: 168 10*3/uL (ref 150–450)
RBC: 3.46 x10E6/uL — ABNORMAL LOW (ref 3.77–5.28)
RDW: 13.6 % (ref 11.7–15.4)
WBC: 6 10*3/uL (ref 3.4–10.8)

## 2018-10-26 LAB — LIPID PANEL
Chol/HDL Ratio: 1.9 ratio (ref 0.0–4.4)
Cholesterol, Total: 133 mg/dL (ref 100–199)
HDL: 70 mg/dL (ref >39)
LDL Calculated: 51 mg/dL (ref 0–99)
Triglycerides: 61 mg/dL (ref 0–149)
VLDL Cholesterol Cal: 12 mg/dL (ref 5–40)

## 2018-10-26 LAB — T4, FREE: Free T4: 1.69 ng/dL (ref 0.82–1.77)

## 2018-10-26 LAB — TSH: TSH: 0.995 u[IU]/mL (ref 0.450–4.500)

## 2018-10-27 ENCOUNTER — Other Ambulatory Visit: Payer: Self-pay | Admitting: Rheumatology

## 2018-10-27 NOTE — Telephone Encounter (Signed)
Last Visit: 07/06/18 Next Visit: 12/06/18 Labs: 10/25/18 Creat. 1.23 GFR 44 Hgb Hct. 31.8   Okay to refill per Dr. Estanislado Pandy

## 2018-11-01 ENCOUNTER — Encounter: Payer: Self-pay | Admitting: Internal Medicine

## 2018-11-02 ENCOUNTER — Telehealth: Payer: Self-pay | Admitting: Rheumatology

## 2018-11-02 NOTE — Telephone Encounter (Signed)
Patient called to let you know that she spoke with the pharmacy and her Prolia injection will be delivered on sometime on Friday.

## 2018-11-03 MED FILL — PROLIA 60 MG/ML SOLN: 60 | 180 days supply | Qty: 1 | Fill #0

## 2018-11-03 NOTE — Telephone Encounter (Signed)
Patient has been scheduled for 11/08/18 for Prolia injection.

## 2018-11-04 NOTE — Telephone Encounter (Signed)
Prolia delivered from [facility] and placed in the fridge.

## 2018-11-07 ENCOUNTER — Ambulatory Visit: Payer: Medicare HMO | Admitting: Podiatry

## 2018-11-07 ENCOUNTER — Encounter: Payer: Self-pay | Admitting: Podiatry

## 2018-11-07 ENCOUNTER — Other Ambulatory Visit: Payer: Self-pay | Admitting: Podiatry

## 2018-11-07 ENCOUNTER — Ambulatory Visit (INDEPENDENT_AMBULATORY_CARE_PROVIDER_SITE_OTHER): Payer: Medicare HMO

## 2018-11-07 VITALS — BP 156/79 | HR 73 | Resp 16

## 2018-11-07 DIAGNOSIS — M7751 Other enthesopathy of right foot: Secondary | ICD-10-CM

## 2018-11-07 DIAGNOSIS — M2041 Other hammer toe(s) (acquired), right foot: Secondary | ICD-10-CM

## 2018-11-07 DIAGNOSIS — M779 Enthesopathy, unspecified: Secondary | ICD-10-CM

## 2018-11-07 DIAGNOSIS — M2042 Other hammer toe(s) (acquired), left foot: Secondary | ICD-10-CM

## 2018-11-07 DIAGNOSIS — M79671 Pain in right foot: Secondary | ICD-10-CM | POA: Diagnosis not present

## 2018-11-07 DIAGNOSIS — M76829 Posterior tibial tendinitis, unspecified leg: Secondary | ICD-10-CM | POA: Diagnosis not present

## 2018-11-07 DIAGNOSIS — M7752 Other enthesopathy of left foot: Secondary | ICD-10-CM | POA: Diagnosis not present

## 2018-11-07 DIAGNOSIS — M79672 Pain in left foot: Secondary | ICD-10-CM

## 2018-11-07 MED ORDER — TRIAMCINOLONE ACETONIDE 10 MG/ML IJ SUSP
10.0000 mg | Freq: Once | INTRAMUSCULAR | Status: AC
  Administered 2018-11-07: 10 mg

## 2018-11-07 NOTE — Progress Notes (Signed)
   Subjective:    Patient ID: Hannah [name], female    DOB: 1946-06-14, 73 y.o.   MRN: [id number]  HPI    Review of Systems  All other systems reviewed and are negative.      Objective:   Physical Exam        Assessment & Plan:

## 2018-11-07 NOTE — Patient Instructions (Signed)
Hammer Toe  Hammer toe is a change in the shape (a deformity) of your toe. The deformity causes the middle joint of your toe to stay bent. This causes pain, especially when you are wearing shoes. Hammer toe starts gradually. At first, the toe can be straightened. Gradually over time, the deformity becomes stiff and permanent. Early treatments to keep the toe straight may relieve pain. As the deformity becomes stiff and permanent, surgery may be needed to straighten the toe. What are the causes? Hammer toe is caused by abnormal bending of the toe joint that is closest to your foot. It happens gradually over time. This pulls on the muscles and connections (tendons) of the toe joint, making them weak and stiff. It is often related to wearing shoes that are too short or narrow and do not let your toes straighten. What increases the risk? You may be at greater risk for hammer toe if you:  Are female.  Are older.  Wear shoes that are too small.  Wear high-heeled shoes that pinch your toes.  Are a ballet dancer.  Have a second toe that is longer than your big toe (first toe).  Injure your foot or toe.  Have arthritis.  Have a family history of hammer toe.  Have a nerve or muscle disorder. What are the signs or symptoms? The main symptoms of this condition are pain and deformity of the toe. The pain is worse when wearing shoes, walking, or running. Other symptoms may include:  Corns or calluses over the bent part of the toe or between the toes.  Redness and a burning feeling on the toe.  An open sore that forms on the top of the toe.  Not being able to straighten the toe. How is this diagnosed? This condition is diagnosed based on your symptoms and a physical exam. During the exam, your health care provider will try to straighten your toe to see how stiff the deformity is. You may also have tests, such as:  A blood test to check for rheumatoid arthritis.  An X-ray to show how  severe the deformity is. How is this treated? Treatment for this condition will depend on how stiff the deformity is. Surgery is often needed. However, sometimes a hammer toe can be straightened without surgery. Treatments that do not involve surgery include:  Taping the toe into a straightened position.  Using pads and cushions to protect the toe (orthotics).  Wearing shoes that provide enough room for the toes.  Doing toe-stretching exercises at home.  Taking an NSAID to reduce pain and swelling. If these treatments do not help or the toe cannot be straightened, surgery is the next option. The most common surgeries used to straighten a hammer toe include:  Arthroplasty. In this procedure, part of the joint is removed, and that allows the toe to straighten.  Fusion. In this procedure, cartilage between the two bones of the joint is taken out and the bones are fused together into one longer bone.  Implantation. In this procedure, part of the bone is removed and replaced with an implant to let the toe move again.  Flexor tendon transfer. In this procedure, the tendons that curl the toes down (flexor tendons) are repositioned. Follow these instructions at home:  Take over-the-counter and prescription medicines only as told by your health care provider.  Do toe straightening and stretching exercises as told by your health care provider.  Keep all follow-up visits as told by your health care   provider. This is important. How is this prevented?  Wear shoes that give your toes enough room and do not cause pain.  Do not wear high-heeled shoes. Contact a health care provider if:  Your pain gets worse.  Your toe becomes red or swollen.  You develop an open sore on your toe. This information is not intended to replace advice given to you by your health care provider. Make sure you discuss any questions you have with your health care provider. Document Released: 09/11/2000 Document  Revised: 04/12/2017 Document Reviewed: 01/08/2016 Elsevier Interactive Patient Education  2019 Elsevier Inc.  

## 2018-11-08 ENCOUNTER — Ambulatory Visit (INDEPENDENT_AMBULATORY_CARE_PROVIDER_SITE_OTHER): Payer: Medicare HMO | Admitting: *Deleted

## 2018-11-08 VITALS — BP 161/79 | HR 71

## 2018-11-08 DIAGNOSIS — M810 Age-related osteoporosis without current pathological fracture: Secondary | ICD-10-CM | POA: Diagnosis not present

## 2018-11-08 MED ORDER — DENOSUMAB 60 MG/ML ~~LOC~~ SOSY
60.0000 mg | PREFILLED_SYRINGE | Freq: Once | SUBCUTANEOUS | Status: AC
  Administered 2018-11-08: 60 mg via SUBCUTANEOUS

## 2018-11-08 NOTE — Patient Instructions (Signed)
Standing Labs We placed an order today for your standing lab work.    Please come back and get your standing labs in 10 days (November 18, 2018)  We have open lab Monday through Friday from 8:30-11:30 AM and 1:30-4:00 PM  at the office of Dr. Bo Merino.   You may experience shorter wait times on Monday and Friday afternoons. The office is located at [address], [place], [place], [place] [address] No appointment is necessary.   Labs are drawn by [facility].  You may receive a bill from [facility] for your lab work.  If you wish to have your labs drawn at another location, please call the office 24 hours in advance to send orders.  If you have any questions regarding directions or hours of operation,  please call [phone].   Just as a reminder please drink plenty of water prior to coming for your lab work. Thanks!

## 2018-11-08 NOTE — Progress Notes (Signed)
Patient in office for Prolia injection. Patient was given injection in left arm. Patient tolerated injection well. Patient reminded to return in 10 days to have labs. Patient will have next inject in 6 months.     Administrations This Visit    denosumab (PROLIA) injection 60 mg    Admin Date 11/08/2018 Action Given Dose 60 mg Route Subcutaneous Administered By Carole Binning, LPN

## 2018-11-13 NOTE — Progress Notes (Signed)
Subjective:   Patient ID: Hannah [name], female   DOB: 73 y.o.   MRN: [id number]   HPI Patient presents stating that she has been getting pain in both feet and she has digital deformities with history of lupus and arthritis.  States is gotten gradually worse over the last few years and is increasingly hard to wear shoe gear comfortable as she does not remember specific injury.  Patient does not smoke and likes to be active   Review of Systems  All other systems reviewed and are negative.       Objective:  Physical Exam Vitals signs and nursing note reviewed.  Constitutional:      Appearance: She is well-developed.  Pulmonary:     Effort: Pulmonary effort is normal.  Musculoskeletal: Normal range of motion.  Skin:    General: Skin is warm.  Neurological:     Mental Status: She is alert.     Neurovascular status found to be intact muscle strength is adequate range of motion was moderately reduced of the subtalar midtarsal joint.  I noted there to be exquisite discomfort in the right sinus tarsi and in the left forefoot and around the fifth MPJ with inflammation fluid buildup noted.  Patient is found to have good digital perfusion well oriented x3 with moderate elevation of the digits bilateral     Assessment:  Inflammatory capsulitis sinus tarsi right forefoot inflammation around the fifth MPJ left with patient noted to have good digital perfusion and well oriented x3 with moderate arthritic issues     Plan:  H&P x-rays and conditions reviewed.  At this time we will get a go ahead try to treat this is an acute inflammatory reaction and she will make shoe gear modifications today I did sterile prep and injected the sinus tarsi right 3 mg Kenalog 5 mg Xylocaine left fifth MPJ 3 mg dexamethasone Kenalog 5 mg Xylocaine.  Advised on supportive shoes stretching exercises and reappoint as symptoms indicate  X-rays indicate there is elevation of the lesser digits with moderate  compression noted and moderate changes in the sinus tarsi subtalar joint bilateral

## 2018-11-18 ENCOUNTER — Other Ambulatory Visit: Payer: Self-pay

## 2018-11-18 DIAGNOSIS — M3219 Other organ or system involvement in systemic lupus erythematosus: Secondary | ICD-10-CM

## 2018-11-18 DIAGNOSIS — Z79899 Other long term (current) drug therapy: Secondary | ICD-10-CM

## 2018-11-19 LAB — COMPLETE METABOLIC PANEL WITH GFR
AG Ratio: 1.8 (calc) (ref 1.0–2.5)
ALT: 17 U/L (ref 6–29)
AST: 18 U/L (ref 10–35)
Albumin: 4.1 g/dL (ref 3.6–5.1)
Alkaline phosphatase (APISO): 44 U/L (ref 37–153)
BUN/Creatinine Ratio: 15 (calc) (ref 6–22)
BUN: 17 mg/dL (ref 7–25)
CO2: 28 mmol/L (ref 20–32)
Calcium: 8.9 mg/dL (ref 8.6–10.4)
Chloride: 107 mmol/L (ref 98–110)
Creat: 1.16 mg/dL — ABNORMAL HIGH (ref 0.60–0.93)
GFR, Est African American: 54 mL/min/{1.73_m2} — ABNORMAL LOW (ref >=60)
GFR, Est Non African American: 47 mL/min/{1.73_m2} — ABNORMAL LOW (ref >=60)
Globulin: 2.3 g/dL (calc) (ref 1.9–3.7)
Glucose, Bld: 77 mg/dL (ref 65–99)
Potassium: 4.2 mmol/L (ref 3.5–5.3)
Sodium: 142 mmol/L (ref 135–146)
Total Bilirubin: 0.4 mg/dL (ref 0.2–1.2)
Total Protein: 6.4 g/dL (ref 6.1–8.1)

## 2018-11-19 LAB — CBC WITH DIFFERENTIAL/PLATELET
Absolute Monocytes: 534 cells/uL (ref 200–950)
Basophils Absolute: 48 cells/uL (ref 0–200)
Basophils Relative: 0.8 %
Eosinophils Absolute: 522 cells/uL — ABNORMAL HIGH (ref 15–500)
Eosinophils Relative: 8.7 %
HCT: 31.6 % — ABNORMAL LOW (ref 35.0–45.0)
Hemoglobin: 10.7 g/dL — ABNORMAL LOW (ref 11.7–15.5)
Lymphs Abs: 1242 cells/uL (ref 850–3900)
MCH: 31.7 pg (ref 27.0–33.0)
MCHC: 33.9 g/dL (ref 32.0–36.0)
MCV: 93.5 fL (ref 80.0–100.0)
MPV: 11.8 fL (ref 7.5–12.5)
Monocytes Relative: 8.9 %
Neutro Abs: 3654 cells/uL (ref 1500–7800)
Neutrophils Relative %: 60.9 %
Platelets: 155 10*3/uL (ref 140–400)
RBC: 3.38 10*6/uL — ABNORMAL LOW (ref 3.80–5.10)
RDW: 13.6 % (ref 11.0–15.0)
Total Lymphocyte: 20.7 %
WBC: 6 10*3/uL (ref 3.8–10.8)

## 2018-11-21 NOTE — Progress Notes (Signed)
Anemia is stable. Creatinine and GFR are improving. We will continue to monitor.

## 2018-11-22 NOTE — Progress Notes (Signed)
Office Visit Note  Patient: Hannah [name]             Date of Birth: [birth year]           MRN: [id number]             PCP: Glendale Chard, MD Referring: Glendale Chard, MD Visit Date: 12/06/2018 Occupation: @GUAROCC @  Subjective:  Right knee joint pain   History of Present Illness: Hannah [name] is a 73 y.o. female with history of seropositive rheumatoid arthritis, gout, systemic lupus erythematosus, osteoporosis, and osteoarthritis.  She is taking Plaquenil 200 mg 1 tablet by mouth daily.  She has been receiving Prolia injections every 6 months.  Her last injection was on 11/08/2018.  For management of gout she is taking allopurinol 100 mg 1 tablet by mouth daily.  She denies any recent gout flares.  She denies any RA or lupus flares recently.   She reports pain in both knee joints on the lateral aspect.  She reports the pain is worse in the right knee joint. She denies any joint swelling. She has intermittent hand pain but no joint swelling. She denies any mouth or nose sores.  She denies any mouth dryness but continues to have eye dryness and uses drops PRN.  She denies any recent rashes, hair loss, or photosensitivity. She has intermittent symptoms of Raynaud's but denies digital ulcerations. She denies any shortness of breath or palpitations.  She denies any fevers or swollen lymph nodes.  Her level of fatigue has been stable.  She was evaluated by Dr. Paulla Dolly on 11/07/18 and he performed injection in the sinus tarsi right and  left fifth MPJ.  Her pain has resolved.    Activities of Daily Living:  Patient reports morning stiffness for several hours.   Patient Denies nocturnal pain.  Difficulty dressing/grooming: Denies Difficulty climbing stairs: Reports Difficulty getting out of chair: Reports Difficulty using hands for taps, buttons, cutlery, and/or writing: Reports  Review of Systems  Constitutional: Positive for fatigue.  HENT: Negative for mouth sores, mouth dryness and  nose dryness.   Eyes: Positive for dryness. Negative for pain, itching and visual disturbance.  Respiratory: Negative for cough, hemoptysis, shortness of breath, wheezing and difficulty breathing.   Cardiovascular: Negative for chest pain, palpitations, hypertension and swelling in legs/feet.  Gastrointestinal: Negative for abdominal pain, blood in stool, constipation and diarrhea.  Endocrine: Negative for increased urination.  Genitourinary: Negative for painful urination and pelvic pain.  Musculoskeletal: Positive for arthralgias, joint pain, joint swelling and morning stiffness. Negative for myalgias, muscle weakness, muscle tenderness and myalgias.  Skin: Negative for color change, pallor, rash, hair loss, nodules/bumps, redness, skin tightness, ulcers and sensitivity to sunlight.  Allergic/Immunologic: Negative for susceptible to infections.  Neurological: Negative for dizziness, light-headedness, numbness, headaches and weakness.  Hematological: Negative for swollen glands.  Psychiatric/Behavioral: Negative for depressed mood, confusion and sleep disturbance. The patient is not nervous/anxious.     PMFS History:  Patient Active Problem List   Diagnosis Date Noted  . Iron deficiency anemia 08/12/2017  . Osteoporosis 10/02/2016  . Systemic lupus erythematosus ([place]) 10/02/2016  . Rheumatoid arthritis involving multiple sites with positive rheumatoid factor ([place]) 10/02/2016  . High risk medication use 10/02/2016  . History of chronic kidney disease 10/02/2016  . 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/2016  . Primary osteoarthritis of both knees 10/02/2016  . Vitamin D deficiency 10/02/2016  . History of diabetes mellitus 10/02/2016  . History of hypertension  10/02/2016  . History of asthma 10/02/2016  . Absolute anemia   . MGUS (monoclonal gammopathy of unknown significance)   . Normocytic anemia 03/21/2015  . Essential hypertension 03/20/2015  . Abnormal  CT of the chest 07/30/2012  . Asthma 06/19/2012    Past Medical History:  Diagnosis Date  . Arthritis   . Asthma   . Diabetes mellitus without complication ([place])   . Gout 12/17/2014   patient reported  . Hyperlipidemia   . Hypertension     Family History  Problem Relation Age of Onset  . Heart disease Father   . Diabetes Father   . Hypertension Mother   . Hypertension Sister   . Hypertension Sister   . Anemia Sister    Past Surgical History:  Procedure Laterality Date  . CATARACT EXTRACTION Bilateral 2015  . DILATION AND CURETTAGE OF UTERUS    . DOPPLER ECHOCARDIOGRAPHY  05/2018   Internist to review with pt; potential heart murmur 06/20/18  . SKIN SURGERY  11/30/2018   left side of face   Social History   Social History Narrative  . Not on file   Immunization History  Administered Date(s) Administered  . 19-influenza Whole 07/15/2012  . Influenza Split 06/28/2014  . Influenza,inj,Quad PF,6+ Mos 06/20/2018  . Pneumococcal-Unspecified 06/28/2014, 05/04/2018     Objective: Vital Signs: BP (!) 163/90 (BP Location: Left Arm, Patient Position: Sitting, Cuff Size: Normal)   Pulse 77   Ht 4' 9.25" (1.454 m)   Wt 163 lb (73.9 kg)   BMI 34.97 kg/m    Physical Exam Vitals signs and nursing note reviewed.  Constitutional:      Appearance: She is well-developed.  HENT:     Head: Normocephalic and atraumatic.  Eyes:     Conjunctiva/sclera: Conjunctivae normal.  Neck:     Musculoskeletal: Normal range of motion.  Cardiovascular:     Rate and Rhythm: Normal rate and regular rhythm.     Heart sounds: Murmur present.  Pulmonary:     Effort: Pulmonary effort is normal.     Breath sounds: Normal breath sounds.  Abdominal:     General: Bowel sounds are normal.     Palpations: Abdomen is soft.  Lymphadenopathy:     Cervical: No cervical adenopathy.  Skin:    General: Skin is warm and dry.     Capillary Refill: Capillary refill takes less than 2 seconds.    Neurological:     Mental Status: She is alert and oriented to person, place, and time.  Psychiatric:        Behavior: Behavior normal.      Musculoskeletal Exam: C-spine good ROM.  Thoracic kyphosis noted.  Limited ROM of lumbar spine.  No midline spinal tenderness.  No SI joint tenderness.  Shoulder joints, elbow joints, wrist joints, MCPs, PIPs ,and DIPs good ROM with no synovitis.  Synovial thickening of right 1st, 2nd, and 3rd MCP joints.  PIP and DIP synovial thickening.  Ulnar deviation bilaterally. Hip joints, knee joints, ankle joints, MTPs, PIPs, and DIPs good ROM with no synovitis.  No warmth or effusion of knee joints.  Bilateral knee crepitus. Right knee limited extension. No tenderness of swelling of ankle joints.  No achilles tendonitis or plantar fasciitis. Hammertoes noted.   CDAI Exam: CDAI Score: Not documented Patient Global Assessment: Not documented; Provider Global Assessment: Not documented Swollen: Not documented; Tender: Not documented Joint Exam   Not documented   There is currently no information documented on the homunculus.  Go to the Rheumatology activity and complete the homunculus joint exam.  Investigation: No additional findings.  Imaging: Dg Foot 2 Views Left  Result Date: 11/07/2018 Please see detailed radiograph report in office note.  Dg Foot 2 Views Right  Result Date: 11/07/2018 Please see detailed radiograph report in office note.   Recent Labs: Lab Results  Component Value Date   WBC 6.0 11/18/2018   HGB 10.7 (L) 11/18/2018   PLT 155 11/18/2018   NA 142 11/18/2018   K 4.2 11/18/2018   CL 107 11/18/2018   CO2 28 11/18/2018   GLUCOSE 77 11/18/2018   BUN 17 11/18/2018   CREATININE 1.16 (H) 11/18/2018   BILITOT 0.4 11/18/2018   ALKPHOS 53 10/25/2018   AST 18 11/18/2018   ALT 17 11/18/2018   PROT 6.4 11/18/2018   ALBUMIN 4.4 10/25/2018   CALCIUM 8.9 11/18/2018   GFRAA 54 (L) 11/18/2018    Speciality Comments: PLQ eye exam:  08/17/18 WNL @ [facility] Follow up in 1 year   Procedures:  No procedures performed Allergies: Shellfish allergy      Assessment / Plan:     Visit Diagnoses: Rheumatoid arthritis involving multiple sites with positive rheumatoid factor (HCC) - Positive RF, positive anti-CCP: She has no synovitis or tenderness on exam.  She has not had any recent RA flares.  She is clinically doing well on PLQ 200 mg 1 tablet po daily.  She has chronic pain in both knee joints but no warmth or effusion was noted.  She will continue on Plaquenil 200 mg 1 tablet po daily.  She does not need any refills at this time.  She was advised to notify us if she develops increased joint pain or joint swelling.  She will follow up in 5 months.  Future order for sed rate was placed today to be drawn with next lab work. - Plan: Sedimentation rate  Other systemic lupus erythematosus with other organ involvement (HCC) - Positive ANA, positive Smith, positive RNP: She has not had any signs or symptoms of a lupus flare.  She is clinically doing well on PLQ 200 mg 1 tablet po daily.  She has no synovitis on exam.  She continues to have eye dryness and uses eye drops.  She has no mouth dryness or parotid swelling/tenderness.  She has intermittent symptoms of Raynaud's but no digital ulcerations or signs of gangrene.  She has not had any recent rashes.  No malar rash noted.  She has no photosensitivity or hair loss.  She has not had any palpitations or shortness of breath recently.  Lungs were clear to auscultation. Murmur was auscultated.  No pleural or pericardial rub apparent.  She continues to have chronic fatigue that has been stable.  No fevers or cervical lymphadenopathy.  She will continue on PLQ 200 mg 1 tablet po daily.  We will check autoimmune labs at her next visit.  Future orders were placed.  She was advised to notify us if she develops signs or symptoms of a flare. - Plan: CBC with Differential/Platelet, ANA, Urinalysis,  Routine w reflex microscopic, COMPLETE METABOLIC PANEL WITH GFR, Anti-DNA antibody, double-stranded, C3 and C4, Sedimentation rate, VITAMIN D 25 Hydroxy (Vit-D Deficiency, Fractures)  High risk medication use-Plaquenil 200 mg daily.  Last Plaquenil eye exam normal on 08/17/2018 and will monitor yearly.  Most recent CBC/CMP stable on 11/18/2018 and will monitor every 5 months.-- Plan: CBC with Differential/Platelet, COMPLETE METABOLIC PANEL WITH GFR  Age-related osteoporosis without  current pathological fracture - She is on Prolia every 6 months and last injection on 11/08/2018. DEXA on 07/28/2018 showed T score of -2.7 at lumbar spine and BMD of 0.574 with statistically significant increase in BMD in all areas scanned. 07/27/2016 T score -3.3, BMD 0.488.  Vitamin D level will be checked with next lab work. - Plan: VITAMIN D 25 Hydroxy (Vit-D Deficiency, Fractures)  Idiopathic chronic gout of multiple sites without tophus -she has not had any recent gout flares.  She is clinically doing well on allopurinol 100 mg 1 tablet by mouth daily.  She has not needed to take colchicine recently.  She does not need any refills of allopurinol at this time.  A future order for uric acid was placed today.  She is advised to notify us if she develops signs or symptoms of a gout flare. uric acid: 03/07/2018 4.7 - Plan: Uric acid  Primary osteoarthritis of both knees: No warmth or effusion.  She was slightly limited extension of the right knee joint.  She has bilateral knee crepitus.  She has chronic pain in bilateral knee joints.  MGUS (monoclonal gammopathy of unknown significance): She is followed by Dr. Irene Limbo.   History of vitamin D deficiency -Future order for vitamin D was placed today.  Plan: VITAMIN D 25 Hydroxy (Vit-D Deficiency, Fractures)  Other medical conditions are listed as follows:   History of asthma  History of diabetes mellitus  History of chronic kidney disease  History of anemia  History of  hypertension   Orders: Orders Placed This Encounter  Procedures  . CBC with Differential/Platelet  . ANA  . Urinalysis, Routine w reflex microscopic  . COMPLETE METABOLIC PANEL WITH GFR  . Anti-DNA antibody, double-stranded  . C3 and C4  . Sedimentation rate  . VITAMIN D 25 Hydroxy (Vit-D Deficiency, Fractures)  . Uric acid   No orders of the defined types were placed in this encounter.   Face-to-face time spent with patient was 30 minutes. Greater than 50% of time was spent in counseling and coordination of care.  Follow-Up Instructions: Return in about 5 months (around 05/08/2019) for Rheumatoid arthritis, Systemic lupus erythematosus, Gout, Osteoporosis.   Ofilia Neas, PA-C   I examined and evaluated the patient with Hazel Sams PA.  Patient has been having discomfort in multiple joints.  No synovitis was noted on my exam.  Clinical findings are consistent with osteoarthritis.  Her labs have been stable.  She will continue Plaquenil.  She recently had Prolia.  The plan of care was discussed as noted above.  Bo Merino, MD  Note - This record has been created using Editor, commissioning.  Chart creation errors have been sought, but may not always  have been located. Such creation errors do not reflect on  the standard of medical care.

## 2018-11-30 ENCOUNTER — Other Ambulatory Visit: Payer: Self-pay | Admitting: Physician Assistant

## 2018-11-30 DIAGNOSIS — D485 Neoplasm of uncertain behavior of skin: Secondary | ICD-10-CM | POA: Diagnosis not present

## 2018-11-30 DIAGNOSIS — L409 Psoriasis, unspecified: Secondary | ICD-10-CM | POA: Diagnosis not present

## 2018-11-30 DIAGNOSIS — D229 Melanocytic nevi, unspecified: Secondary | ICD-10-CM | POA: Diagnosis not present

## 2018-11-30 DIAGNOSIS — L820 Inflamed seborrheic keratosis: Secondary | ICD-10-CM | POA: Diagnosis not present

## 2018-11-30 HISTORY — PX: SKIN SURGERY: SHX2413

## 2018-12-03 ENCOUNTER — Encounter: Payer: Self-pay | Admitting: Internal Medicine

## 2018-12-03 ENCOUNTER — Other Ambulatory Visit: Payer: Self-pay | Admitting: Internal Medicine

## 2018-12-06 ENCOUNTER — Ambulatory Visit: Payer: Medicare HMO | Admitting: Rheumatology

## 2018-12-06 ENCOUNTER — Encounter: Payer: Self-pay | Admitting: Rheumatology

## 2018-12-06 VITALS — BP 163/90 | HR 77 | Ht <= 58 in | Wt 163.0 lb

## 2018-12-06 DIAGNOSIS — D472 Monoclonal gammopathy: Secondary | ICD-10-CM | POA: Diagnosis not present

## 2018-12-06 DIAGNOSIS — Z8709 Personal history of other diseases of the respiratory system: Secondary | ICD-10-CM | POA: Diagnosis not present

## 2018-12-06 DIAGNOSIS — M170 Bilateral primary osteoarthritis of knee: Secondary | ICD-10-CM

## 2018-12-06 DIAGNOSIS — M0579 Rheumatoid arthritis with rheumatoid factor of multiple sites without organ or systems involvement: Secondary | ICD-10-CM | POA: Diagnosis not present

## 2018-12-06 DIAGNOSIS — Z79899 Other long term (current) drug therapy: Secondary | ICD-10-CM

## 2018-12-06 DIAGNOSIS — Z8679 Personal history of other diseases of the circulatory system: Secondary | ICD-10-CM

## 2018-12-06 DIAGNOSIS — Z87448 Personal history of other diseases of urinary system: Secondary | ICD-10-CM

## 2018-12-06 DIAGNOSIS — M810 Age-related osteoporosis without current pathological fracture: Secondary | ICD-10-CM | POA: Diagnosis not present

## 2018-12-06 DIAGNOSIS — M1A09X0 Idiopathic chronic gout, multiple sites, without tophus (tophi): Secondary | ICD-10-CM

## 2018-12-06 DIAGNOSIS — M3219 Other organ or system involvement in systemic lupus erythematosus: Secondary | ICD-10-CM | POA: Diagnosis not present

## 2018-12-06 DIAGNOSIS — Z8639 Personal history of other endocrine, nutritional and metabolic disease: Secondary | ICD-10-CM | POA: Diagnosis not present

## 2018-12-06 DIAGNOSIS — Z862 Personal history of diseases of the blood and blood-forming organs and certain disorders involving the immune mechanism: Secondary | ICD-10-CM

## 2018-12-14 ENCOUNTER — Telehealth: Payer: Self-pay | Admitting: *Deleted

## 2018-12-14 NOTE — Telephone Encounter (Signed)
Patient called to verify appointment times for 3/23 and 3/30 and that [facility] will be open with corona virus in community. Informed her that at this time [facility] is seeing all patient's as scheduled and will notify her if anything changes. Patient verbalized understanding and expressed appreciation.

## 2018-12-19 ENCOUNTER — Inpatient Hospital Stay: Payer: Medicare HMO | Attending: Hematology

## 2018-12-19 ENCOUNTER — Other Ambulatory Visit: Payer: Self-pay

## 2018-12-19 DIAGNOSIS — Z79899 Other long term (current) drug therapy: Secondary | ICD-10-CM | POA: Insufficient documentation

## 2018-12-19 DIAGNOSIS — D472 Monoclonal gammopathy: Secondary | ICD-10-CM | POA: Diagnosis not present

## 2018-12-19 DIAGNOSIS — Z7982 Long term (current) use of aspirin: Secondary | ICD-10-CM | POA: Insufficient documentation

## 2018-12-19 DIAGNOSIS — E119 Type 2 diabetes mellitus without complications: Secondary | ICD-10-CM | POA: Insufficient documentation

## 2018-12-19 DIAGNOSIS — D649 Anemia, unspecified: Secondary | ICD-10-CM | POA: Diagnosis not present

## 2018-12-19 DIAGNOSIS — Z7984 Long term (current) use of oral hypoglycemic drugs: Secondary | ICD-10-CM | POA: Diagnosis not present

## 2018-12-19 DIAGNOSIS — D508 Other iron deficiency anemias: Secondary | ICD-10-CM

## 2018-12-19 LAB — CBC WITH DIFFERENTIAL/PLATELET
Abs Immature Granulocytes: 0.01 10*3/uL (ref 0.00–0.07)
Basophils Absolute: 0 10*3/uL (ref 0.0–0.1)
Basophils Relative: 1 %
Eosinophils Absolute: 0.4 10*3/uL (ref 0.0–0.5)
Eosinophils Relative: 8 %
HCT: 35.1 % — ABNORMAL LOW (ref 36.0–46.0)
Hemoglobin: 11 g/dL — ABNORMAL LOW (ref 12.0–15.0)
Immature Granulocytes: 0 %
Lymphocytes Relative: 17 %
Lymphs Abs: 0.8 10*3/uL (ref 0.7–4.0)
MCH: 30.9 pg (ref 26.0–34.0)
MCHC: 31.3 g/dL (ref 30.0–36.0)
MCV: 98.6 fL (ref 80.0–100.0)
Monocytes Absolute: 0.3 10*3/uL (ref 0.1–1.0)
Monocytes Relative: 7 %
Neutro Abs: 3.2 10*3/uL (ref 1.7–7.7)
Neutrophils Relative %: 67 %
Platelets: 149 10*3/uL — ABNORMAL LOW (ref 150–400)
RBC: 3.56 MIL/uL — ABNORMAL LOW (ref 3.87–5.11)
RDW: 13.7 % (ref 11.5–15.5)
WBC: 4.7 10*3/uL (ref 4.0–10.5)
nRBC: 0 % (ref 0.0–0.2)

## 2018-12-19 LAB — CMP (CANCER CENTER ONLY)
ALT: 21 U/L (ref 0–44)
AST: 23 U/L (ref 15–41)
Albumin: 3.9 g/dL (ref 3.5–5.0)
Alkaline Phosphatase: 46 U/L (ref 38–126)
Anion gap: 10 (ref 5–15)
BUN: 20 mg/dL (ref 8–23)
CO2: 24 mmol/L (ref 22–32)
Calcium: 8.8 mg/dL — ABNORMAL LOW (ref 8.9–10.3)
Chloride: 107 mmol/L (ref 98–111)
Creatinine: 1.11 mg/dL — ABNORMAL HIGH (ref 0.44–1.00)
GFR, Est AFR Am: 57 mL/min — ABNORMAL LOW (ref >60)
GFR, Estimated: 49 mL/min — ABNORMAL LOW (ref >60)
Glucose, Bld: 107 mg/dL — ABNORMAL HIGH (ref 70–99)
Potassium: 3.7 mmol/L (ref 3.5–5.1)
Sodium: 141 mmol/L (ref 135–145)
Total Bilirubin: 0.7 mg/dL (ref 0.3–1.2)
Total Protein: 7.2 g/dL (ref 6.5–8.1)

## 2018-12-19 LAB — IRON AND TIBC
Iron: 71 ug/dL (ref 41–142)
Saturation Ratios: 22 % (ref 21–57)
TIBC: 321 ug/dL (ref 236–444)
UIBC: 251 ug/dL (ref 120–384)

## 2018-12-19 LAB — FERRITIN: Ferritin: 24 ng/mL (ref 11–307)

## 2018-12-23 ENCOUNTER — Other Ambulatory Visit: Payer: Self-pay | Admitting: Internal Medicine

## 2018-12-23 LAB — MULTIPLE MYELOMA PANEL, SERUM
Albumin SerPl Elph-Mcnc: 3.8 g/dL (ref 2.9–4.4)
Albumin/Glob SerPl: 1.4 (ref 0.7–1.7)
Alpha 1: 0.2 g/dL (ref 0.0–0.4)
Alpha2 Glob SerPl Elph-Mcnc: 0.8 g/dL (ref 0.4–1.0)
B-Globulin SerPl Elph-Mcnc: 0.9 g/dL (ref 0.7–1.3)
Gamma Glob SerPl Elph-Mcnc: 0.9 g/dL (ref 0.4–1.8)
Globulin, Total: 2.8 g/dL (ref 2.2–3.9)
IgA: 165 mg/dL (ref 64–422)
IgG (Immunoglobin G), Serum: 1029 mg/dL (ref 700–1600)
IgM (Immunoglobulin M), Srm: 34 mg/dL (ref 26–217)
M Protein SerPl Elph-Mcnc: 0.2 g/dL — ABNORMAL HIGH
Total Protein ELP: 6.6 g/dL (ref 6.0–8.5)

## 2018-12-26 ENCOUNTER — Inpatient Hospital Stay (HOSPITAL_BASED_OUTPATIENT_CLINIC_OR_DEPARTMENT_OTHER): Payer: Medicare HMO | Admitting: Hematology

## 2018-12-26 ENCOUNTER — Telehealth: Payer: Self-pay | Admitting: Hematology

## 2018-12-26 DIAGNOSIS — D649 Anemia, unspecified: Secondary | ICD-10-CM

## 2018-12-26 DIAGNOSIS — M329 Systemic lupus erythematosus, unspecified: Secondary | ICD-10-CM

## 2018-12-26 DIAGNOSIS — N189 Chronic kidney disease, unspecified: Secondary | ICD-10-CM

## 2018-12-26 DIAGNOSIS — D472 Monoclonal gammopathy: Secondary | ICD-10-CM

## 2018-12-26 NOTE — Progress Notes (Signed)
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 The patient's last visit with [place] was on 06/20/18. The pt reports that she is doing well overall.   The pt reports that she has continued to take one Iron Polysaccharide each day and denies any problems tolerating this. The pt notes that she has continued follow up with Dr. Patrecia Pour in Rheumatology, and notes that her RA and other considerations have been stable. The pt continues on the same dose of Plaquenil and denies any changes in medications. She has continued to receive Prolia injections every 6 months. She adds that her thyroid has been stable and continues follow up with her PCP, most recently about 2 months ago.  The pt notes that she continues exercising and is avoiding public spaces and crowds. She denies any concerns for infections at this time.  Lab results (12/19/18) of CBC w/diff and CMP is as follows: all values are WNL except for RBC at 3.56, HGB at 11.0, HCT at 35.1, PLT at 149k, Glucose at 107, Creatinine at 1.11, Calcium at 8.8, GFR at 57. 12/19/18 Ferritin at 24 12/19/18 Iron and TIBC revealed all values WNL 12/19/18 MMP revealed all values WNL except for M Protein at 0.2g  On review of systems, pt reports stable energy levels, staying active, stable joint pains, and denies concerns for infections, and any other symptoms.  REVIEW OF SYSTEMS:    A 10+ POINT REVIEW OF SYSTEMS WAS OBTAINED including neurology,  dermatology, psychiatry, cardiac, respiratory, lymph, extremities, GI, GU, Musculoskeletal, constitutional, breasts, reproductive, HEENT.  All pertinent positives are noted in the HPI.  All others are negative.  . Past Medical History:  Diagnosis Date  . Arthritis   . Asthma   . Diabetes mellitus without complication ([place])   . Gout 12/17/2014   patient reported  . Hyperlipidemia   . Hypertension   possible SLE/rheumatoid arthritis following with Dr.Deveshwar.  . Past Surgical History:  Procedure Laterality Date  . CATARACT EXTRACTION Bilateral 2015  . DILATION AND CURETTAGE OF UTERUS    . DOPPLER ECHOCARDIOGRAPHY  05/2018   Internist to review with pt; potential heart murmur 06/20/18  . SKIN SURGERY  11/30/2018   left side of face    . Social History   Tobacco Use  . Smoking status: Never Smoker  . Smokeless tobacco: Never Used  Substance Use Topics  . Alcohol use: Yes    Comment: rarely   . Drug use: No    ALLERGIES:  is allergic to shellfish allergy.  MEDICATIONS:  Current Outpatient Medications  Medication Sig Dispense Refill  . albuterol (PROVENTIL HFA;VENTOLIN HFA) 108 (90 Base) MCG/ACT inhaler Inhale 2 puffs into the lungs every 6 (six) hours as needed for wheezing or shortness of breath. 1 Inhaler 2  . allopurinol (ZYLOPRIM) 100 MG tablet Take 100 mg by mouth daily.    Hannah [name] amLODipine (NORVASC) 2.5 MG tablet TAKE 1 TABLET BY MOUTH EVERY DAY 90 tablet 2  . aspirin EC 81 MG tablet Take 81  mg by mouth daily.    Hannah [name] atorvastatin (LIPITOR) 80 MG tablet TAKE 1 TABLET BY MOUTH EVERY DAY 90 tablet 0  . budesonide-formoterol (SYMBICORT) 160-4.5 MCG/ACT inhaler INHALE 2 PUFFS BY MOUTH TWICE DAILY IN THE MORNING AND EVENING (Patient taking differently: as needed. INHALE 2 PUFFS BY MOUTH TWICE DAILY IN THE MORNING AND EVENING) 10.2 g 0  . Calcium Carb-Cholecalciferol (CALCIUM 600+D) 600-800 MG-UNIT TABS Take by mouth 2 (two) times daily.     . Cholecalciferol (VITAMIN D PO) Take  1,000 Units by mouth daily.     . hydroxychloroquine (PLAQUENIL) 200 MG tablet TAKE 1 TABLET(200 MG) BY MOUTH DAILY 90 tablet 0  . Hypromellose (ARTIFICIAL TEARS OP) Apply to eye as needed.    . irbesartan (AVAPRO) 300 MG tablet Take 1 tablet (300 mg total) by mouth daily. 90 tablet 2  . iron polysaccharides (NIFEREX) 150 MG capsule Take 1 capsule (150 mg total) by mouth daily. 60 capsule 3  . metFORMIN (GLUCOPHAGE) 500 MG tablet Take 500 mg daily by mouth.     . PROLIA 60 MG/ML SOSY injection INJECT 60 MG INTO THE SKIN EVERY 6 MONTHS. ADMINISTER IN UPPER ARM, THIGH, OR ABDOMEN 1 mL 0  . SYNTHROID 88 MCG tablet TAKE 1 TABLET BY MOUTH EVERY DAY MONDAY TO SATURDAY AND OFF ON SUNDAY. 30 tablet 0   No current facility-administered medications for this visit.     PHYSICAL EXAMINATION: ECOG PERFORMANCE STATUS: 1 - Symptomatic but completely ambulatory  There were no vitals filed for this visit.  There were no vitals filed for this visit. .There is no height or weight on file to calculate BMI.  Phone visit  LABORATORY DATA:   I have reviewed the data as listed  . CBC Latest Ref Rng & Units 12/19/2018 11/18/2018 10/25/2018  WBC 4.0 - 10.5 K/uL 4.7 6.0 6.0  Hemoglobin 12.0 - 15.0 g/dL 11.0(L) 10.7(L) 10.7(L)  Hematocrit 36.0 - 46.0 % 35.1(L) 31.6(L) 31.8(L)  Platelets 150 - 400 K/uL 149(L) 155 168    . CMP Latest Ref Rng & Units 12/19/2018 11/18/2018 10/25/2018  Glucose 70 - 99 mg/dL 107(H) 77 87  BUN 8 - 23 mg/dL _0 Creatinine 0.44 - 1.00 mg/dL 1.11(H) 1.16(H) 1.23(H)  Sodium 135 - 145 mmol/L 141 142 141  Potassium 3.5 - 5.1 mmol/L 3.7 4.2 4.5  Chloride 98 - 111 mmol/L 107 107 101  CO2 22 - 32 mmol/L _1 Calcium 8.9 - 10.3 mg/dL 8.8(L) 8.9 9.9  Total Protein 6.5 - 8.1 g/dL 7.2 6.4 6.9  Total Bilirubin 0.3 - 1.2 mg/dL 0.7 0.4 0.7  Alkaline Phos 38 - 126 U/L 46 - 53  AST 15 - 41 U/L _2 ALT 0 - 44 U/L _3 .   Lab Results  Component Value Date   FERRITIN  24 12/19/2018   . Lab Results  Component Value Date   IRON 71 12/19/2018   TIBC 321 12/19/2018   IRONPCTSAT 22 12/19/2018   (Iron and TIBC)  Lab Results  Component Value Date   FERRITIN 24 12/19/2018          RADIOGRAPHIC STUDIES: I have personally reviewed the radiological images as listed and agreed with the findings in the report. No results found. METASTATIC BONE SURVEY 0/02/2015  COMPARISON: None.  FINDINGS: Heart is normal size. Right diaphragmatic hernia again noted, unchanged. Lungs are clear. No effusions.  No focal lytic lesions within the visualized bony  structures or acute bony abnormality. Degenerative changes within the shoulders, lower cervical spine, mid to lower thoracic spine, and lower lumbar spine. Advanced degenerative facet disease in the lower lumbar spine. 9 mm of anterolisthesis of L4 on L5. Endplate sclerosis within L4 and L5. Mild degenerative changes in the hips. Advanced degenerative changes within the knees. 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  IMPRESSION: No focal lytic lesion.  Degenerative joint disease involving multiple joints as described above.  Grade 2 anterolisthesis of L4 on L5 related to facet disease.   ASSESSMENT & PLAN:   73 year old of an American female with  #1 IgG kappa monoclonal gammopathy of undetermined significance. Bone survey X-ray showed no lytic lesions. M spike is 0.6 g/dL in 10/016 and then 0.5g/dl in 10/2015. SPEP in May/2017 and 07/2016 showed M proteinwas stable at 0.3g/dl  MGUS likely related to underlying connective tissue disorder.  Bone marrow biopsy does show about 13% plasma cells however these appear to be polyclonal and likely related to a possible underlying inflammatory disorder. Less likely a biclonal plasma cell dyscrasia.  #2 Normocytic normochromic anemia likely related to chronic inflammation from recent diagnosis of lupus/rheumatoid arthritis +  CKD.  Increased acanthocytes on peripheral blood smear -no overt evidence of liver disease. Could be a marker of some MDS Bone marrow biopsy did not show overt plasma cell dyscrasia or myelodysplastic syndrome. 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 Bone marrow biopsy showed decreased iron stores. Ferritin and iron have been trending down, she has not noticed any issues with bruising or bleeding recently. ?absorption issue. I have given her the option of IV iron which she has previously had without issue. She would like to have this.   PLAN: -Discussed pt labwork from 12/19/18; HGB improved to 11.0, Ferritin at 24, M Protein stable at 0.2g. -Discussed that I believe the patient's monoclonal protein is attributable to her rheumatologic considerations, given the pattern of stability over time, and recommend continuing to follow up with rheumatology given the benefit this will also have on the patient's anemia. 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 DEstanislado Pandy Plan -Continue optimization of her autoimmune disorder treatment with rheumatology Dr. DEstanislado Pandy -She has no symptoms or findings suggestive of multiple myeloma at this time. -Continue follow-up with primary care physician and rheumatology.   RTC with DR KIrene Limboin 6 months with labs   No orders of the defined types were placed in this encounter.  The total time spent in the appt was 20 minutes and more than 50% was on counseling and direct patient cares.   GSullivan Lone MD MJacksonvilleAAHIVMS SNorthern [facility] LLCCDigestive Disease Endoscopy CenterHematology/Oncology Physician [facility] (Office):       [phone](Work cell):  [phone](Fax):           [phone] I, SBaldwin Jamaica am acting as a scribe for Dr. GSullivan Lone   .I have reviewed the above documentation for accuracy and completeness, and I agree with the above. .Brunetta GeneraMD

## 2018-12-26 NOTE — Telephone Encounter (Signed)
Called and scheduled appt per 3/30 los. ° °Patient aware of appt date and time. °

## 2018-12-30 ENCOUNTER — Other Ambulatory Visit: Payer: Self-pay | Admitting: Rheumatology

## 2018-12-30 DIAGNOSIS — M069 Rheumatoid arthritis, unspecified: Secondary | ICD-10-CM

## 2018-12-30 DIAGNOSIS — M0579 Rheumatoid arthritis with rheumatoid factor of multiple sites without organ or systems involvement: Secondary | ICD-10-CM

## 2018-12-30 NOTE — Telephone Encounter (Signed)
Last Visit: 12/06/18 Next Visit: 05/16/19 Labs: 12/19/18 RBC 3.56, Hgb 11.0 Hct 35.1 Platelets 149 Glucose, Creat 1.11 GFR 57 Calcium 8.8  PLQ Eye Exam: 08/17/18 WNL  Okay to refill per Dr. Estanislado Pandy

## 2019-01-02 ENCOUNTER — Ambulatory Visit: Payer: Medicare HMO | Admitting: Podiatry

## 2019-01-02 ENCOUNTER — Encounter: Payer: Self-pay | Admitting: Podiatry

## 2019-01-02 ENCOUNTER — Other Ambulatory Visit: Payer: Self-pay

## 2019-01-02 VITALS — Temp 98.1°F

## 2019-01-02 DIAGNOSIS — M79671 Pain in right foot: Secondary | ICD-10-CM | POA: Diagnosis not present

## 2019-01-02 DIAGNOSIS — M76829 Posterior tibial tendinitis, unspecified leg: Secondary | ICD-10-CM | POA: Diagnosis not present

## 2019-01-02 DIAGNOSIS — M79672 Pain in left foot: Secondary | ICD-10-CM | POA: Diagnosis not present

## 2019-01-02 DIAGNOSIS — M779 Enthesopathy, unspecified: Secondary | ICD-10-CM | POA: Diagnosis not present

## 2019-01-02 NOTE — Progress Notes (Signed)
Subjective:   Patient ID: Hannah [name], female   DOB: 73 y.o.   MRN: [id number]   HPI Patient states her feet feel quite a bit better and SAS shoes have been beneficial and states the injection seems to have made a big difference   ROS      Objective:  Physical Exam  Neurovascular status intact with patient's feet improved with discomfort only upon deep palpation     Assessment:  Doing better with capsulitis right fifth metatarsal inflammation left with mild forefoot discomfort left still noted     Plan:  Reviewed all 3 conditions and recommend continued supportive shoes anti-inflammatory physical therapy and padding for the dorsum of the left foot.  If symptoms get worse patient is to let us know

## 2019-01-05 ENCOUNTER — Other Ambulatory Visit: Payer: Self-pay | Admitting: Internal Medicine

## 2019-01-14 DIAGNOSIS — H1132 Conjunctival hemorrhage, left eye: Secondary | ICD-10-CM | POA: Diagnosis not present

## 2019-01-14 DIAGNOSIS — H04123 Dry eye syndrome of bilateral lacrimal glands: Secondary | ICD-10-CM | POA: Diagnosis not present

## 2019-01-16 DIAGNOSIS — M329 Systemic lupus erythematosus, unspecified: Secondary | ICD-10-CM | POA: Diagnosis not present

## 2019-01-16 DIAGNOSIS — D631 Anemia in chronic kidney disease: Secondary | ICD-10-CM | POA: Diagnosis not present

## 2019-01-16 DIAGNOSIS — E785 Hyperlipidemia, unspecified: Secondary | ICD-10-CM | POA: Diagnosis not present

## 2019-01-16 DIAGNOSIS — I129 Hypertensive chronic kidney disease with stage 1 through stage 4 chronic kidney disease, or unspecified chronic kidney disease: Secondary | ICD-10-CM | POA: Diagnosis not present

## 2019-01-16 DIAGNOSIS — E1122 Type 2 diabetes mellitus with diabetic chronic kidney disease: Secondary | ICD-10-CM | POA: Diagnosis not present

## 2019-01-16 DIAGNOSIS — N183 Chronic kidney disease, stage 3 (moderate): Secondary | ICD-10-CM | POA: Diagnosis not present

## 2019-01-16 DIAGNOSIS — N2581 Secondary hyperparathyroidism of renal origin: Secondary | ICD-10-CM | POA: Diagnosis not present

## 2019-01-16 DIAGNOSIS — D472 Monoclonal gammopathy: Secondary | ICD-10-CM | POA: Diagnosis not present

## 2019-01-23 ENCOUNTER — Other Ambulatory Visit: Payer: Self-pay | Admitting: Internal Medicine

## 2019-02-23 ENCOUNTER — Ambulatory Visit (INDEPENDENT_AMBULATORY_CARE_PROVIDER_SITE_OTHER): Payer: Medicare HMO | Admitting: Internal Medicine

## 2019-02-23 ENCOUNTER — Other Ambulatory Visit: Payer: Self-pay

## 2019-02-23 ENCOUNTER — Encounter: Payer: Self-pay | Admitting: Internal Medicine

## 2019-02-23 VITALS — BP 136/72 | HR 74 | Temp 98.3°F | Ht <= 58 in | Wt 164.6 lb

## 2019-02-23 DIAGNOSIS — N183 Chronic kidney disease, stage 3 unspecified: Secondary | ICD-10-CM

## 2019-02-23 DIAGNOSIS — E039 Hypothyroidism, unspecified: Secondary | ICD-10-CM

## 2019-02-23 DIAGNOSIS — I129 Hypertensive chronic kidney disease with stage 1 through stage 4 chronic kidney disease, or unspecified chronic kidney disease: Secondary | ICD-10-CM

## 2019-02-23 DIAGNOSIS — R002 Palpitations: Secondary | ICD-10-CM | POA: Diagnosis not present

## 2019-02-23 DIAGNOSIS — Z6835 Body mass index (BMI) 35.0-35.9, adult: Secondary | ICD-10-CM

## 2019-02-23 DIAGNOSIS — Z23 Encounter for immunization: Secondary | ICD-10-CM | POA: Diagnosis not present

## 2019-02-23 DIAGNOSIS — E66812 Obesity, class 2: Secondary | ICD-10-CM

## 2019-02-23 DIAGNOSIS — E1122 Type 2 diabetes mellitus with diabetic chronic kidney disease: Secondary | ICD-10-CM | POA: Diagnosis not present

## 2019-02-23 MED ORDER — BUDESONIDE-FORMOTEROL FUMARATE 160-4.5 MCG/ACT IN AERO: INHALATION_SPRAY | RESPIRATORY_TRACT | 0 refills | Status: DC

## 2019-02-23 MED ORDER — TETANUS-DIPHTH-ACELL PERTUSSIS 5-2.5-18.5 LF-MCG/0.5 IM SUSP: 0.5000 mL | Freq: Once | INTRAMUSCULAR | 0 refills | Status: AC

## 2019-02-23 MED ORDER — BUDESONIDE-FORMOTEROL FUMARATE 160-4.5 MCG/ACT IN AERO: INHALATION_SPRAY | RESPIRATORY_TRACT | 11 refills | Status: DC

## 2019-02-23 MED ORDER — TETANUS-DIPHTH-ACELL PERTUSSIS 5-2-15.5 LF-MCG/0.5 IM SUSP: 0.5000 mL | Freq: Once | INTRAMUSCULAR | 0 refills | Status: DC

## 2019-02-23 NOTE — Patient Instructions (Signed)
Palpitations  Palpitations are feelings that your heartbeat is not normal. Your heartbeat may feel like it is:   Uneven.   Faster than normal.   Fluttering.   Skipping a beat.  This is usually not a serious problem. In some cases, you may need tests to rule out any serious problems.  Follow these instructions at home:  Pay attention to any changes in your condition. Take these actions to help manage your symptoms:  Eating and drinking   Avoid:  ? Coffee, tea, soft drinks, and energy drinks.  ? Chocolate.  ? Alcohol.  ? Diet pills.  Lifestyle     Try to lower your stress. These things can help you relax:  ? Yoga.  ? Deep breathing and meditation.  ? Exercise.  ? Using words and images to create positive thoughts (guided imagery).  ? Using your mind to control things in your body (biofeedback).   Do not use drugs.   Get plenty of rest and sleep. Keep a regular bed time.  General instructions     Take over-the-counter and prescription medicines only as told by your doctor.   Do not use any products that contain nicotine or tobacco, such as cigarettes and e-cigarettes. If you need help quitting, ask your doctor.   Keep all follow-up visits as told by your doctor. This is important. You may need more tests if palpitations do not go away or get worse.  Contact a doctor if:   Your symptoms last more than 24 hours.   Your symptoms occur more often.  Get help right away if you:   Have chest pain.   Feel short of breath.   Have a very bad headache.   Feel dizzy.   Pass out (faint).  Summary   Palpitations are feelings that your heartbeat is uneven or faster than normal. It may feel like your heart is fluttering or skipping a beat.   Avoid food and drinks that may cause palpitations. These include caffeine, chocolate, and alcohol.   Try to lower your stress. Do not smoke or use drugs.   Get help right away if you faint or have chest pain, shortness of breath, a severe headache, or dizziness.  This  information is not intended to replace advice given to you by your health care provider. Make sure you discuss any questions you have with your health care provider.  Document Released: 06/23/2008 Document Revised: 10/27/2017 Document Reviewed: 10/27/2017  Elsevier Interactive Patient Education  2019 Elsevier Inc.

## 2019-02-23 NOTE — Progress Notes (Signed)
Subjective:     Patient ID: Hannah [name] , female    DOB: Dec 29, 1945 , 73 y.o.   MRN: [id number]   Chief Complaint  Patient presents with  . Diabetes  . Hypertension  . Hypothyroidism    HPI  Diabetes  She presents for her follow-up diabetic visit. She has type 2 diabetes mellitus. There are no hypoglycemic associated symptoms. Pertinent negatives for hypoglycemia include no headaches. Pertinent negatives for diabetes include no blurred vision and no chest pain. There are no hypoglycemic complications. Diabetic complications include nephropathy. Risk factors for coronary artery disease include diabetes mellitus, dyslipidemia, hypertension, post-menopausal and sedentary lifestyle. She is following a diabetic diet. She participates in exercise three times a week. An ACE inhibitor/angiotensin II receptor blocker is being taken.  Hypertension  This is a chronic problem. The current episode started more than 1 year ago. The problem has been gradually improving since onset. The problem is controlled. Associated symptoms include palpitations. Pertinent negatives include no blurred vision, chest pain or headaches. Identifiable causes of hypertension include a thyroid problem.  Thyroid Problem  Presents for follow-up visit. Symptoms include palpitations. The symptoms have been stable.     Past Medical History:  Diagnosis Date  . Arthritis   . Asthma   . Diabetes mellitus without complication ([place])   . Gout 12/17/2014   patient reported  . Hyperlipidemia   . Hypertension      Family History  Problem Relation Age of Onset  . Heart disease Father   . Diabetes Father   . Hypertension Mother   . Hypertension Sister   . Hypertension Sister   . Anemia Sister      Current Outpatient Medications:  .  albuterol (PROVENTIL HFA;VENTOLIN HFA) 108 (90 Base) MCG/ACT inhaler, Inhale 2 puffs into the lungs every 6 (six) hours as needed for wheezing or shortness of breath., Disp: 1 Inhaler,  Rfl: 2 .  allopurinol (ZYLOPRIM) 100 MG tablet, TAKE 1 TABLET BY MOUTH EVERY DAY FOR GOUT, Disp: 90 tablet, Rfl: 0 .  amLODipine (NORVASC) 2.5 MG tablet, TAKE 1 TABLET BY MOUTH EVERY DAY, Disp: 90 tablet, Rfl: 2 .  aspirin EC 81 MG tablet, Take 81 mg by mouth daily., Disp: , Rfl:  .  atorvastatin (LIPITOR) 80 MG tablet, TAKE 1 TABLET BY MOUTH EVERY DAY, Disp: 90 tablet, Rfl: 0 .  Calcium Carb-Cholecalciferol (CALCIUM 600+D) 600-800 MG-UNIT TABS, Take by mouth 2 (two) times daily. , Disp: , Rfl:  .  Cholecalciferol (VITAMIN D PO), Take 1,000 Units by mouth daily. , Disp: , Rfl:  .  budesonide-formoterol (SYMBICORT) 160-4.5 MCG/ACT inhaler, INHALE 2 PUFFS BY MOUTH TWICE DAILY IN THE MORNING AND EVENING, Disp: 10.2 g, Rfl: 0 .  hydroxychloroquine (PLAQUENIL) 200 MG tablet, TAKE 1 TABLET BY MOUTH DAILY, Disp: 90 tablet, Rfl: 0 .  Hypromellose (ARTIFICIAL TEARS OP), Apply to eye as needed., Disp: , Rfl:  .  irbesartan (AVAPRO) 300 MG tablet, Take 1 tablet (300 mg total) by mouth daily., Disp: 90 tablet, Rfl: 2 .  iron polysaccharides (NIFEREX) 150 MG capsule, Take 1 capsule (150 mg total) by mouth daily., Disp: 60 capsule, Rfl: 3 .  metFORMIN (GLUCOPHAGE) 500 MG tablet, Take 500 mg daily by mouth. , Disp: , Rfl:  .  PROLIA 60 MG/ML SOSY injection, INJECT 60 MG INTO THE SKIN EVERY 6 MONTHS. ADMINISTER IN UPPER ARM, THIGH, OR ABDOMEN, Disp: 1 mL, Rfl: 0 .  SYNTHROID 88 MCG tablet, TAKE 1 TABLET BY MOUTH EVERY  DAY MONDAY TO SATURDAY AND OFF SUNDAYS, Disp: 90 tablet, Rfl: 0 .  Tdap (ADACEL) 01-27-14.5 LF-MCG/0.5 injection, Inject 0.5 mLs into the muscle once for 1 dose., Disp: 0.5 mL, Rfl: 0   Allergies  Allergen Reactions  . Shellfish Allergy Anaphylaxis and Hives     Review of Systems  Constitutional: Negative.   Eyes: Negative for blurred vision.  Respiratory: Negative.   Cardiovascular: Positive for palpitations. Negative for chest pain.       She reports having an episode of palpitations last  week. She reports she was lying in bed when it occurred. She denies associated chest pain, shortness of breath.   Gastrointestinal: Negative.   Musculoskeletal: Negative.   Neurological: Negative.  Negative for headaches.  Hematological: Negative.   Psychiatric/Behavioral: Negative.      Today's Vitals   02/23/19 1033  BP: (!) 156/82  Pulse: 74  Temp: 98.3 F (36.8 C)  TempSrc: Oral  Weight: 164 lb 9.6 oz (74.7 kg)  Height: 4' 9.25" (1.454 m)  PainSc: 2   PainLoc: Back   Body mass index is 35.31 kg/m.   Objective:  Physical Exam Vitals signs and nursing note reviewed.  Constitutional:      Appearance: Normal appearance.  HENT:     Head: Normocephalic and atraumatic.  Cardiovascular:     Rate and Rhythm: Normal rate and regular rhythm.     Heart sounds: Normal heart sounds.  Pulmonary:     Effort: Pulmonary effort is normal.     Breath sounds: Normal breath sounds.  Skin:    General: Skin is warm.  Neurological:     General: No focal deficit present.     Mental Status: She is alert.  Psychiatric:        Mood and Affect: Mood normal.        Behavior: Behavior normal.         Assessment And Plan:     1. Diabetes mellitus with stage 3 chronic kidney disease (HCC)  I will check CMP, hba1c today. Importance of regular exercise was discussed with the patient. She is encouraged to cut back on her intake of refined sweets.   2. Hypertensive nephropathy  Fair control. She will continue with current meds. She is encouraged to avoid adding salt to her foods.   3. Primary hypothyroidism  I will check thyroid panel and adjust meds as needed.  4. Palpitations  I will check her thyroid function today along with her electrolyte levels.  EKG performed, no arrhythmia noted. She will let me know if she has any recurrent symptoms.   - EKG 12-Lead  5. Class 2 severe obesity due to excess calories with serious comorbidity and body mass index (BMI) of 35.0 to 35.9 in adult  [facility])  Importance of achieving optimal weight to decrease risk of cardiovascular disease and cancers was discussed with the patient in full detail. She is encouraged to start slowly - start with 10 minutes twice daily at least three to four days per week and to gradually build to 30 minutes five days weekly. She was given tips to incorporate more activity into her daily routine - take stairs when possible, park farther away from grocery stores, etc.    6. Need for vaccination  I will send rx for Tdap to the pharmacy.   [name], MD    THE PATIENT IS ENCOURAGED TO PRACTICE SOCIAL DISTANCING DUE TO THE COVID-19 PANDEMIC.

## 2019-02-24 LAB — CMP14+EGFR
ALT: 19 IU/L (ref 0–32)
AST: 24 IU/L (ref 0–40)
Albumin/Globulin Ratio: 1.8 (ref 1.2–2.2)
Albumin: 4.2 g/dL (ref 3.7–4.7)
Alkaline Phosphatase: 47 IU/L (ref 39–117)
BUN/Creatinine Ratio: 8 — ABNORMAL LOW (ref 12–28)
BUN: 9 mg/dL (ref 8–27)
Bilirubin Total: 0.4 mg/dL (ref 0.0–1.2)
CO2: 22 mmol/L (ref 20–29)
Calcium: 8.7 mg/dL (ref 8.7–10.3)
Chloride: 103 mmol/L (ref 96–106)
Creatinine, Ser: 1.12 mg/dL — ABNORMAL HIGH (ref 0.57–1.00)
GFR calc Af Amer: 56 mL/min/{1.73_m2} — ABNORMAL LOW (ref >59)
GFR calc non Af Amer: 49 mL/min/{1.73_m2} — ABNORMAL LOW (ref >59)
Globulin, Total: 2.4 g/dL (ref 1.5–4.5)
Glucose: 85 mg/dL (ref 65–99)
Potassium: 4.4 mmol/L (ref 3.5–5.2)
Sodium: 140 mmol/L (ref 134–144)
Total Protein: 6.6 g/dL (ref 6.0–8.5)

## 2019-02-24 LAB — MAGNESIUM: Magnesium: 2 mg/dL (ref 1.6–2.3)

## 2019-02-24 LAB — HEMOGLOBIN A1C
Est. average glucose Bld gHb Est-mCnc: 117 mg/dL
Hgb A1c MFr Bld: 5.7 % — ABNORMAL HIGH (ref 4.8–5.6)

## 2019-02-24 LAB — T4, FREE: Free T4: 1.52 ng/dL (ref 0.82–1.77)

## 2019-02-24 LAB — TSH: TSH: 0.916 u[IU]/mL (ref 0.450–4.500)

## 2019-02-28 DIAGNOSIS — Z1231 Encounter for screening mammogram for malignant neoplasm of breast: Secondary | ICD-10-CM | POA: Diagnosis not present

## 2019-02-28 LAB — HM MAMMOGRAPHY: HM Mammogram: NORMAL (ref 0–4)

## 2019-03-03 ENCOUNTER — Encounter: Payer: Self-pay | Admitting: Internal Medicine

## 2019-03-22 ENCOUNTER — Other Ambulatory Visit: Payer: Self-pay | Admitting: Internal Medicine

## 2019-03-30 ENCOUNTER — Other Ambulatory Visit: Payer: Self-pay | Admitting: Rheumatology

## 2019-03-30 DIAGNOSIS — M069 Rheumatoid arthritis, unspecified: Secondary | ICD-10-CM

## 2019-03-30 DIAGNOSIS — M0579 Rheumatoid arthritis with rheumatoid factor of multiple sites without organ or systems involvement: Secondary | ICD-10-CM

## 2019-03-30 NOTE — Telephone Encounter (Signed)
Last Visit: 12/06/18 Next Visit: 05/16/19 Labs: 12/19/18 RBC 3.56, Hgb 11.0 Hct 35.1 Platelets 149 Glucose, Creat 1.11 GFR 57 Calcium 8.8  PLQ Eye Exam: 08/17/18 WNL  Okay to refill per Dr. Estanislado Pandy

## 2019-04-07 ENCOUNTER — Telehealth: Payer: Self-pay | Admitting: *Deleted

## 2019-04-07 NOTE — Telephone Encounter (Signed)
Patient advised to follow up with PCP for evaluation.

## 2019-04-07 NOTE — Telephone Encounter (Signed)
Discussed with Dr. Estanislado Pandy.  Please advise patient to follow up with PCP for evaluation.

## 2019-04-07 NOTE — Telephone Encounter (Signed)
Patient came by office for labs. Patient is not due labs until around 04/29/19 as her Prolia will be due 05/09/19. Patient advised. Patient states she is having numbness in bilateral feet and discoloration from ankles down. Patient states she thought is could possibly be a tan line but is unsure. Patient states she has been experiencing this for about 3-4 weeks. Patient states she feels like her feet are slightly swollen . Patient is on PLQ 1 tablet po daily. Patient due to follow up 05/16/19. Please advise.

## 2019-04-11 DIAGNOSIS — Z7189 Other specified counseling: Secondary | ICD-10-CM | POA: Diagnosis not present

## 2019-04-11 DIAGNOSIS — Z20828 Contact with and (suspected) exposure to other viral communicable diseases: Secondary | ICD-10-CM | POA: Diagnosis not present

## 2019-04-14 ENCOUNTER — Other Ambulatory Visit: Payer: Self-pay | Admitting: Internal Medicine

## 2019-04-21 ENCOUNTER — Other Ambulatory Visit: Payer: Self-pay | Admitting: Internal Medicine

## 2019-05-01 ENCOUNTER — Telehealth: Payer: Self-pay | Admitting: *Deleted

## 2019-05-01 NOTE — Telephone Encounter (Signed)
Patient requested lab results completed prior to iron infusions. Lab dates: Feb 03, 2016 (had iron infusions 02/07/2016 & 02/14/2016) and August 12, 2017 (had iron infusions 09/08/2017 & 09/18/2017). Lab results printed and placed in envelope for patient pick up at [facility] reception desk.

## 2019-05-02 ENCOUNTER — Telehealth: Payer: Self-pay | Admitting: Pharmacist

## 2019-05-02 NOTE — Progress Notes (Signed)
Office Visit Note  Patient: Hannah [name]             Date of Birth: 1946/04/16           MRN: [id number]             PCP: Glendale Chard, MD Referring: Glendale Chard, MD Visit Date: 05/16/2019 Occupation: @GUAROCC @  Subjective:  Discuss medication options    History of Present Illness: Hannah [name] is a 73 y.o. female with history of seropositive rheumatoid arthritis, osteoporosis, gout, and systemic lupus erythematosus.  She continues to take Plaquenil 200 mg 1 tablet by mouth daily.  She denies any recent rheumatoid arthritis, lupus, or gout flares.  She states that she is having tenderness in bilateral third PIP joints.  She denies any joint swelling currently.  She continues to have chronic pain in bilateral knee joints but denies any joint swelling.  She denies any recent rashes, photosensitivity, symptoms of Raynaud's, sores in her mouth or nose.  She continues to have chronic eye dryness and uses over-the-counter eyedrops.  She denies any mouth dryness.  She has chronic fatigue but denies any fevers or swollen lymph nodes.  She denies any shortness of breath or palpitations. She has been receiving Prolia injections every 6 months.  She is unsure if she is approved for the grant and cannot afford to stay on Prolia if she is not.  Activities of Daily Living:  Patient reports morning stiffness for 15-20 minutes.   Patient Denies nocturnal pain.  Difficulty dressing/grooming: Denies Difficulty climbing stairs: Reports Difficulty getting out of chair: Denies Difficulty using hands for taps, buttons, cutlery, and/or writing: Denies  Review of Systems  Constitutional: Positive for fatigue.  HENT: Negative for mouth sores, mouth dryness and nose dryness.   Eyes: Positive for dryness. Negative for pain, itching and visual disturbance.  Respiratory: Negative for cough, hemoptysis, shortness of breath, wheezing and difficulty breathing.   Cardiovascular: Negative for chest  pain, palpitations, hypertension and swelling in legs/feet.  Gastrointestinal: Negative for abdominal pain, blood in stool, constipation and diarrhea.  Endocrine: Negative for increased urination.  Genitourinary: Negative for painful urination.  Musculoskeletal: Positive for arthralgias, joint pain, joint swelling and morning stiffness. Negative for myalgias, muscle weakness, muscle tenderness and myalgias.  Skin: Negative for color change, pallor, rash, hair loss, nodules/bumps, redness, skin tightness, ulcers and sensitivity to sunlight.  Allergic/Immunologic: Negative for susceptible to infections.  Neurological: Negative for dizziness, light-headedness, headaches, memory loss and weakness.  Hematological: Negative for swollen glands.  Psychiatric/Behavioral: Positive for sleep disturbance. Negative for depressed mood and confusion. The patient is not nervous/anxious.     PMFS History:  Patient Active Problem List   Diagnosis Date Noted  . Iron deficiency anemia 08/12/2017  . Osteoporosis 10/02/2016  . Systemic lupus erythematosus ([place]) 10/02/2016  . Rheumatoid arthritis involving multiple sites with positive rheumatoid factor ([place]) 10/02/2016  . High risk medication use 10/02/2016  . History of chronic kidney disease 10/02/2016  . 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/2016  . Primary osteoarthritis of both knees 10/02/2016  . Vitamin D deficiency 10/02/2016  . History of diabetes mellitus 10/02/2016  . History of hypertension 10/02/2016  . History of asthma 10/02/2016  . Absolute anemia   . MGUS (monoclonal gammopathy of unknown significance)   . Normocytic anemia 03/21/2015  . Essential hypertension 03/20/2015  . Abnormal CT of the chest 07/30/2012  . Asthma 06/19/2012    Past Medical History:  Diagnosis Date  .  Arthritis   . Asthma   . Diabetes mellitus without complication ([place])   . Gout 12/17/2014   patient reported  . Hyperlipidemia   .  Hypertension     Family History  Problem Relation Age of Onset  . Heart disease Father   . Diabetes Father   . Hypertension Mother   . Hypertension Sister   . Hypertension Sister   . Leukemia Sister    Past Surgical History:  Procedure Laterality Date  . CATARACT EXTRACTION Bilateral 2015  . DILATION AND CURETTAGE OF UTERUS    . DOPPLER ECHOCARDIOGRAPHY  05/2018   Internist to review with pt; potential heart murmur 06/20/18  . SKIN SURGERY  11/30/2018   left side of face   Social History   Social History Narrative  . Not on file   Immunization History  Administered Date(s) Administered  . 19-influenza Whole 07/15/2012  . Influenza Split 06/28/2014  . Influenza,inj,Quad PF,6+ Mos 06/20/2018  . Pneumococcal Conjugate-13 05/04/2018  . Pneumococcal-Unspecified 06/28/2014     Objective: Vital Signs: BP (!) 154/73 (BP Location: Left Arm, Patient Position: Sitting, Cuff Size: Large)   Pulse 72   Resp 12   Ht 4' 10.5" (1.486 m)   Wt 162 lb 6.4 oz (73.7 kg)   BMI 33.36 kg/m    Physical Exam Vitals signs and nursing note reviewed.  Constitutional:      Appearance: She is well-developed.  HENT:     Head: Normocephalic and atraumatic.  Eyes:     Conjunctiva/sclera: Conjunctivae normal.  Neck:     Musculoskeletal: Normal range of motion.  Cardiovascular:     Rate and Rhythm: Normal rate and regular rhythm.     Heart sounds: Normal heart sounds.  Pulmonary:     Effort: Pulmonary effort is normal.     Breath sounds: Normal breath sounds.  Abdominal:     General: Bowel sounds are normal.     Palpations: Abdomen is soft.  Lymphadenopathy:     Cervical: No cervical adenopathy.  Skin:    General: Skin is warm and dry.     Capillary Refill: Capillary refill takes less than 2 seconds.  Neurological:     Mental Status: She is alert and oriented to person, place, and time.  Psychiatric:        Behavior: Behavior normal.      Musculoskeletal Exam: C-spine good range  of motion.  Thoracic kyphosis noted.  Limited range of motion lumbar spine.  No midline spinal tenderness.  No SI joint tenderness.  Shoulder joints, elbow joints, wrist joints, MCPs, PIPs and DIPs good range of motion with no synovitis.  She has PIP and DIP synovial thickening.  She has tenderness of the bilateral third PIP joints.  Hip joints, knee joints, ankle joints, MTPs, PIPs, DIPs good range of motion no synovitis.  No warmth or effusion of bilateral knee joints.  No tenderness or swelling of ankle joints.  CDAI Exam: CDAI Score: - Patient Global: -; Provider Global: - Swollen: -; Tender: - Joint Exam   No joint exam has been documented for this visit   There is currently no information documented on the homunculus. Go to the Rheumatology activity and complete the homunculus joint exam.  Investigation: No additional findings.  Imaging: No results found.  Recent Labs: Lab Results  Component Value Date   WBC 4.7 12/19/2018   HGB 11.0 (L) 12/19/2018   PLT 149 (L) 12/19/2018   NA 140 02/23/2019   K 4.4 02/23/2019  CL 103 02/23/2019   CO2 22 02/23/2019   GLUCOSE 85 02/23/2019   BUN 9 02/23/2019   CREATININE 1.12 (H) 02/23/2019   BILITOT 0.4 02/23/2019   ALKPHOS 47 02/23/2019   AST 24 02/23/2019   ALT 19 02/23/2019   PROT 6.6 02/23/2019   ALBUMIN 4.2 02/23/2019   CALCIUM 8.7 02/23/2019   GFRAA 56 (L) 02/23/2019    Speciality Comments: PLQ eye exam: 08/17/18 WNL @ [facility] Follow up in 1 year   Procedures:  No procedures performed Allergies: Shellfish allergy      Assessment / Plan:     Visit Diagnoses: Rheumatoid arthritis involving multiple sites with positive rheumatoid factor (HCC) - Positive RF, positive anti-CCP: She has no synovitis on exam.  She has not had any recent rheumatoid arthritis flares.  She has tenderness of bilateral third PIP joints on exam today.  She has PIP and DIP synovial thickening consistent with osteoarthritis of bilateral  hands.  She is clinically doing well on Plaquenil 200 mg 1 tablet by mouth daily.  She has not missed any doses of Plaquenil recently.  She has chronic pain in bilateral knee joints but no warmth or effusion was noted.  She will continue on the current treatment regimen.  She was advised to notify us if she develops increased joint pain or joint swelling.  She will follow-up in the office in 5 months.  Other systemic lupus erythematosus with other organ involvement (HCC) - Positive ANA, positive Smith, positive RNP: She has no any signs or symptoms of a lupus flare recently.  She is clinically doing well on Plaquenil 200 mg 1 tablet by mouth daily.  She has no synovitis on exam.  She has not had any recent rashes, sores in her mouth or nose, photosensitivity, symptoms of Raynaud's, fevers, swollen lymph nodes, palpitations, shortness of breath.  She has occasional eye dryness and uses over-the-counter eyedrops.  She has not had any mouth dryness.  She will continue taking Plaquenil as prescribed.  She does not need any refills at this time.  We will check the following lab work.  She was advised to notify us if she develops any new or worsening symptoms.- Plan: CBC with Differential/Platelet, COMPLETE METABOLIC PANEL WITH GFR, VITAMIN D 25 Hydroxy (Vit-D Deficiency, Fractures), Anti-DNA antibody, double-stranded, C3 and C4, Anti-Smith antibody, RNP Antibody  High risk medication use - Plaquenil 200 mg 1 tablet by mouth daily.eye exam: 08/17/2018. CBC and CMP were drawn today to monitor for drug toxicity.    Age-related osteoporosis without current pathological fracture - Patient is on Prolia injections every 6 months for osteoporosis. She was approved for a grant.  History of treatment as follows:  New patient to our office in August 2016 with history of osteoporosis and previously treated with Fosamax with unknown duration.  DEXA in October 2017 showed T score -3.3 and  -10% change in BMD compared to prior  scan in October 2015. We resumed Fosamax in January 2018.  We transitioned patient to [facility] December 2018.  Most recent DEXA October 2019 showed T score -2.7 in right femur neck and +18% change in BMD and positive change in all areas scanned.  She will continue on Prolia.  CBC, CMP, and vitamin D were ordered today.   - Plan: CBC with Differential/Platelet, COMPLETE METABOLIC PANEL WITH GFR, VITAMIN D 25 Hydroxy (Vit-D Deficiency, Fractures)  Idiopathic chronic gout of multiple sites without tophus - She has not had any recent gout flares.  She is clinically doing well on Allopurinol 100 mg 1 tablet by mouth daily.   Primary osteoarthritis of both knees -She has chronic pain in both knee joints.  No warmth or effusion noted.  She has good ROM with no discomfort today.   History of vitamin D deficiency - Vitamin D level will be checked today. Plan: VITAMIN D 25 Hydroxy (Vit-D Deficiency, Fractures)  MGUS (monoclonal gammopathy of unknown significance) - She is followed by Dr. Irene Limbo.   History of hypertension  History of chronic kidney disease  History of diabetes mellitus  History of asthma  History of anemia  Orders: Orders Placed This Encounter  Procedures  . CBC with Differential/Platelet  . COMPLETE METABOLIC PANEL WITH GFR  . VITAMIN D 25 Hydroxy (Vit-D Deficiency, Fractures)  . Anti-DNA antibody, double-stranded  . C3 and C4  . Anti-Smith antibody  . RNP Antibody   No orders of the defined types were placed in this encounter.   Face-to-face time spent with patient was 30 minutes. Greater than 50% of time was spent in counseling and coordination of care.  Follow-Up Instructions: Return in 5 months (on 10/16/2019) for Rheumatoid arthritis, Osteoporosis, Systemic lupus erythematosus.   Ofilia Neas, PA-C   I examined and evaluated the patient with Hazel Sams PA.  Patient has rheumatoid arthritis and lupus overlap.  She is clinically doing well.  Will obtain autoimmune labs  today.  The plan of care was discussed as noted above.  Bo Merino, MD Note - This record has been created using Editor, commissioning.  Chart creation errors have been sought, but may not always  have been located. Such creation errors do not reflect on  the standard of medical care.

## 2019-05-02 NOTE — Telephone Encounter (Signed)
Called to remind patient she is due for her Prolia injection this month and will need labs prior.  She was receiving Prolia through a grant.  The grant expired in June 2020 and she is unable to re-enroll as the grant no longer has funds.  Informed that she may apply for Prolia patient assistance or that if the grant opens up in the future they do a retroactive reimbursement for 6 months as long as the patient retains receipt. Patient verbalized understanding.  She wanted to know if there were other treatment options available.  Will review chart in depth and discuss with Dr. Estanislado Pandy more affordable options.  Patient verbalized understanding.  All questions encouraged and answered.  Instructed patient to call with any further questions or concerns.  Mariella Saa, PharmD, [place], [place] Clinical Specialty Pharmacist [phone]  05/02/2019 4:31 PM

## 2019-05-03 NOTE — Telephone Encounter (Signed)
Please schedule an appointment to discuss treatment options.

## 2019-05-03 NOTE — Telephone Encounter (Signed)
Patient is on Prolia injections every 6 months for osteoporosis. She was approved for a grant which has expired and no longer has funds available. She is having difficulty affording Prolia and wants to know if there are more affordable alternatives.  History of osteoporosis treatment as follows:  New patient to our office in August 2016 with history of osteoporosis and previously treated with Fosamax with unknown duration.  DEXA in October 2017 showed T score -3.3 and  -10% change in BMD compared to prior scan in October 2015.   We resumed Fosamax in January 2018.  We transitioned patient to [facility] December 2018 for unknown reason.  Most recent DEXA October 2019 showed T score -2.7 in right femur neck and +18% change in BMD and positive change in all areas scanned.  Reclast may be a good option as it is a yearly infusion and majority of cost is covered under Medicare Part B or resume Fosamax.  Please advise.   Mariella Saa, PharmD, [place], [place] Clinical Specialty Pharmacist [phone]  05/03/2019 8:51 AM

## 2019-05-05 NOTE — Telephone Encounter (Signed)
Patient has appointment 05/16/2019.  Will follow up and discuss treatment options at that visit.

## 2019-05-08 ENCOUNTER — Telehealth: Payer: Self-pay | Admitting: Rheumatology

## 2019-05-08 NOTE — Telephone Encounter (Signed)
Patient advised the treatment options will be discussed at her appointment on 05/16/19. Patient verbalized understanding.

## 2019-05-08 NOTE — Telephone Encounter (Signed)
Patient called stating the Prolia injection is very expensive and is checking if there is an alternative medication she can take.  Patient states "she spoke with someone from the office who was going to check with Dr. Estanislado Pandy and call her back."  Patient is requesting a return call this afternoon after 1:00 pm.

## 2019-05-11 ENCOUNTER — Telehealth: Payer: Self-pay | Admitting: Internal Medicine

## 2019-05-11 NOTE — Chronic Care Management (AMB) (Signed)
Chronic Care Management   Note  05/11/2019 Name: Hannah [name] MRN: [id number] DOB: 1946-05-23  Hannah [name] is a 73 y.o. year old female who is a primary care patient of Glendale Chard, MD. I reached out to [name] by phone today in response to a referral sent by Ms. Birder Robson Curley's health plan.    Ms. Espiritu was given information about Chronic Care Management services today including:  1. CCM service includes personalized support from designated clinical staff supervised by her physician, including individualized plan of care and coordination with other care providers 2. 24/7 contact phone numbers for assistance for urgent and routine care needs. 3. Service will only be billed when office clinical staff spend 20 minutes or more in a month to coordinate care. 4. Only one practitioner may furnish and bill the service in a calendar month. 5. The patient may stop CCM services at any time (effective at the end of the month) by phone call to the office staff. 6. The patient will be responsible for cost sharing (co-pay) of up to 20% of the service fee (after annual deductible is met).  Patient agreed to services and verbal consent obtained.   Follow up plan: Telephone appointment with CCM team member scheduled for: 06/02/2019  [place]  ??[email or web address]   ??[phone]

## 2019-05-14 ENCOUNTER — Other Ambulatory Visit: Payer: Self-pay | Admitting: Internal Medicine

## 2019-05-15 ENCOUNTER — Telehealth: Payer: Self-pay | Admitting: Pharmacy Technician

## 2019-05-15 DIAGNOSIS — M810 Age-related osteoporosis without current pathological fracture: Secondary | ICD-10-CM

## 2019-05-15 NOTE — Telephone Encounter (Signed)
Called patient and received information to apply for [employer]opor[employer] grant-$1000. Called and submitted application over the phone, due to site being down.  Rep advised that they need a copy of the patient's last tax return forms, which reflect her SSN to confirm identity. Called patient to advise, she will bring documents to her appointment tomorrow.   Will follow up once submitted.   Case# [id number] Phone# [phone] Fax# [phone]  2:59 PM [name], CPhT

## 2019-05-16 ENCOUNTER — Other Ambulatory Visit: Payer: Self-pay

## 2019-05-16 ENCOUNTER — Encounter: Payer: Self-pay | Admitting: Rheumatology

## 2019-05-16 ENCOUNTER — Ambulatory Visit (INDEPENDENT_AMBULATORY_CARE_PROVIDER_SITE_OTHER): Payer: Medicare HMO | Admitting: Rheumatology

## 2019-05-16 VITALS — BP 154/73 | HR 72 | Resp 12 | Ht 58.5 in | Wt 162.4 lb

## 2019-05-16 DIAGNOSIS — Z862 Personal history of diseases of the blood and blood-forming organs and certain disorders involving the immune mechanism: Secondary | ICD-10-CM

## 2019-05-16 DIAGNOSIS — M0579 Rheumatoid arthritis with rheumatoid factor of multiple sites without organ or systems involvement: Secondary | ICD-10-CM | POA: Diagnosis not present

## 2019-05-16 DIAGNOSIS — D472 Monoclonal gammopathy: Secondary | ICD-10-CM | POA: Diagnosis not present

## 2019-05-16 DIAGNOSIS — M170 Bilateral primary osteoarthritis of knee: Secondary | ICD-10-CM | POA: Diagnosis not present

## 2019-05-16 DIAGNOSIS — Z8679 Personal history of other diseases of the circulatory system: Secondary | ICD-10-CM

## 2019-05-16 DIAGNOSIS — Z79899 Other long term (current) drug therapy: Secondary | ICD-10-CM | POA: Diagnosis not present

## 2019-05-16 DIAGNOSIS — M3219 Other organ or system involvement in systemic lupus erythematosus: Secondary | ICD-10-CM | POA: Diagnosis not present

## 2019-05-16 DIAGNOSIS — Z8639 Personal history of other endocrine, nutritional and metabolic disease: Secondary | ICD-10-CM

## 2019-05-16 DIAGNOSIS — M810 Age-related osteoporosis without current pathological fracture: Secondary | ICD-10-CM

## 2019-05-16 DIAGNOSIS — M1A09X0 Idiopathic chronic gout, multiple sites, without tophus (tophi): Secondary | ICD-10-CM

## 2019-05-16 DIAGNOSIS — Z87448 Personal history of other diseases of urinary system: Secondary | ICD-10-CM

## 2019-05-16 DIAGNOSIS — Z8709 Personal history of other diseases of the respiratory system: Secondary | ICD-10-CM

## 2019-05-16 NOTE — Telephone Encounter (Signed)
Patient brought income documents to office visit.  Documents faxed to [phone] for grant approval.  Will update when we receive a response.  She has decided to continue Prolia with the understanding that grants are not always available and she may have to pay out of pocket.

## 2019-05-16 NOTE — Progress Notes (Signed)
Pharmacy Note  Subjective:  Patient presents today to the [facility] to see Dr. Estanislado Pandy.  Patient was seen by the pharmacist for counseling on osteoporosis treatment options. She is on Prolia injections every 6 months for osteoporosis.She was approved for a grant which has expired and concerned about affording Prolia. History of osteoporosis treatment as follows:   New patient to our office in August 2016 with history of osteoporosis and previously treated with Fosamax with unknown duration.  DEXA in October 2017 showedT score -3.3 and-10% change in BMD compared to prior scan in October 2015.   We resumed Fosamax in January 2018.  We transitioned patient to [facility] December 2018 for unknown reason.  Most recent DEXA October 2019 showed T score -2.7 in right femur neck and +18% change in BMD and positive change in all areas scanned.  Last Prolia injection on 11/08/2018  Objective: CMP     Component Value Date/Time   NA 140 02/23/2019 1121   NA 142 08/12/2017 0808   K 4.4 02/23/2019 1121   K 3.7 08/12/2017 0808   CL 103 02/23/2019 1121   CO2 22 02/23/2019 1121   CO2 27 08/12/2017 0808   GLUCOSE 85 02/23/2019 1121   GLUCOSE 107 (H) 12/19/2018 0832   GLUCOSE 85 08/12/2017 0808   BUN 9 02/23/2019 1121   BUN 20.8 08/12/2017 0808   CREATININE 1.12 (H) 02/23/2019 1121   CREATININE 1.11 (H) 12/19/2018 0832   CREATININE 1.16 (H) 11/18/2018 1351   CREATININE 1.4 (H) 08/12/2017 0808   CALCIUM 8.7 02/23/2019 1121   CALCIUM 10.3 08/12/2017 0808   PROT 6.6 02/23/2019 1121   PROT 6.8 08/12/2017 0808   ALBUMIN 4.2 02/23/2019 1121   ALBUMIN 3.7 08/12/2017 0808   AST 24 02/23/2019 1121   AST 23 12/19/2018 0832   AST 31 08/12/2017 0808   ALT 19 02/23/2019 1121   ALT 21 12/19/2018 0832   ALT 38 08/12/2017 0808   ALKPHOS 47 02/23/2019 1121   ALKPHOS 51 08/12/2017 0808   BILITOT 0.4 02/23/2019 1121   BILITOT 0.7 12/19/2018 0832   BILITOT 0.45 08/12/2017 0808   GFRNONAA 49 (L) 02/23/2019 1121   GFRNONAA 49 (L) 12/19/2018 0832   GFRNONAA 47 (L) 11/18/2018 1351   GFRAA 56 (L) 02/23/2019 1121   GFRAA 57 (L) 12/19/2018 0832   GFRAA 54 (L) 11/18/2018 1351    Vitamin D Lab Results  Component Value Date   VD25OH 38 03/07/2018    Assessment/Plan:   Discussed osteoporosis treatment options in depth including Fosamax, Prolia, and Reclast as she is not a candidate for Forteo/Tymlos.  Patient is willing to proceed with Prolia with the understanding that a grant may not always be available.  She is willing to pay out of pocket for injection when needed.  Reviewed important counseling points of Prolia. Counseled patient on purpose, proper use, and adverse effects of Prolia.  Counseled patient that Prolia is a medication that must be injected every 6 months by a healthcare professional.  Advised patient to take calcium 1200 mg daily and vitamin D 800 units daily.  Reviewed the most common adverse effects of Prolia including risk of infection, osteonecrosis of the jaw, rash, and muscle/bone pain.  Patient confirms she does not have any major dental work planned at this time.  Reviewed with patient the signs/symptoms of low calcium and advised patient to alert [place] if she experiences these symptoms.    She is due for her next Prolia injection.  She  will get labs today. A grant opened up yesterday and the application process was started.  She brought her income documents to the office today to be faxed for approval.  Will update the patient when we receive a response.  She is willing to pay out of pocket if she is denied.  We will schedule appointment and send prescription pending labs.  All questions encouraged and answered, Instructed patient to call with any other questions or concerns.  Mariella Saa, PharmD, [place], [place] Clinical Specialty Pharmacist [phone]  05/16/2019 10:15 AM

## 2019-05-16 NOTE — Patient Instructions (Addendum)
*  Your next Plaquenil eye exam is due in November.  Please have your ophthalmologist fax [place] the results of your exam to [phone]*  Standing Labs We placed an order today for your standing lab work.    Please come back and get your standing labs in January and then every 5 months.  We have open lab daily Monday through Thursday from 8:30-12:30 PM and 1:30-4:30 PM and Friday from 8:30-12:30 PM and 1:30 -4:00 PM at the office of Dr. Bo Merino.   You may experience shorter wait times on Monday and Friday afternoons. The office is located at [address] [place], [place], [place] [address] No appointment is necessary.   Labs are drawn by [facility].  You may receive a bill from [place] for your lab work.  If you wish to have your labs drawn at another location, please call the office 24 hours in advance to send orders.  If you have any questions regarding directions or hours of operation,  please call [phone].   Just as a reminder please drink plenty of water prior to coming for your lab work. Thanks!

## 2019-05-17 NOTE — Telephone Encounter (Signed)
Called [employer] to follow up on patient's application. Rep Hoyle Sauer confirmed that patient's documents were received yesterday. Advised to follow up on Friday for processing.  Phone# [phone]  1:35 PM [name], CPhT

## 2019-05-18 LAB — CBC WITH DIFFERENTIAL/PLATELET
Absolute Monocytes: 390 cells/uL (ref 200–950)
Basophils Absolute: 52 cells/uL (ref 0–200)
Basophils Relative: 1 %
Eosinophils Absolute: 322 cells/uL (ref 15–500)
Eosinophils Relative: 6.2 %
HCT: 32.1 % — ABNORMAL LOW (ref 35.0–45.0)
Hemoglobin: 10.8 g/dL — ABNORMAL LOW (ref 11.7–15.5)
Lymphs Abs: 702 cells/uL — ABNORMAL LOW (ref 850–3900)
MCH: 31.5 pg (ref 27.0–33.0)
MCHC: 33.6 g/dL (ref 32.0–36.0)
MCV: 93.6 fL (ref 80.0–100.0)
MPV: 11.6 fL (ref 7.5–12.5)
Monocytes Relative: 7.5 %
Neutro Abs: 3734 cells/uL (ref 1500–7800)
Neutrophils Relative %: 71.8 %
Platelets: 150 10*3/uL (ref 140–400)
RBC: 3.43 10*6/uL — ABNORMAL LOW (ref 3.80–5.10)
RDW: 13.7 % (ref 11.0–15.0)
Total Lymphocyte: 13.5 %
WBC: 5.2 10*3/uL (ref 3.8–10.8)

## 2019-05-18 LAB — COMPLETE METABOLIC PANEL WITH GFR
AG Ratio: 1.5 (calc) (ref 1.0–2.5)
ALT: 15 U/L (ref 6–29)
AST: 19 U/L (ref 10–35)
Albumin: 4 g/dL (ref 3.6–5.1)
Alkaline phosphatase (APISO): 46 U/L (ref 37–153)
BUN/Creatinine Ratio: 12 (calc) (ref 6–22)
BUN: 14 mg/dL (ref 7–25)
CO2: 27 mmol/L (ref 20–32)
Calcium: 9.9 mg/dL (ref 8.6–10.4)
Chloride: 102 mmol/L (ref 98–110)
Creat: 1.19 mg/dL — ABNORMAL HIGH (ref 0.60–0.93)
GFR, Est African American: 52 mL/min/{1.73_m2} — ABNORMAL LOW (ref >=60)
GFR, Est Non African American: 45 mL/min/{1.73_m2} — ABNORMAL LOW (ref >=60)
Globulin: 2.7 g/dL (calc) (ref 1.9–3.7)
Glucose, Bld: 90 mg/dL (ref 65–99)
Potassium: 4.2 mmol/L (ref 3.5–5.3)
Sodium: 138 mmol/L (ref 135–146)
Total Bilirubin: 0.6 mg/dL (ref 0.2–1.2)
Total Protein: 6.7 g/dL (ref 6.1–8.1)

## 2019-05-18 LAB — ANTI-DNA ANTIBODY, DOUBLE-STRANDED: ds DNA Ab: <1 IU/mL

## 2019-05-18 LAB — ANTI-SMITH ANTIBODY: ENA SM Ab Ser-aCnc: <1 AI

## 2019-05-18 LAB — RNP ANTIBODY: Ribonucleic Protein(ENA) Antibody, IgG: <1 AI

## 2019-05-18 LAB — VITAMIN D 25 HYDROXY (VIT D DEFICIENCY, FRACTURES): Vit D, 25-Hydroxy: 44 ng/mL (ref 30–100)

## 2019-05-18 LAB — C3 AND C4
C3 Complement: 115 mg/dL (ref 83–193)
C4 Complement: 38 mg/dL (ref 15–57)

## 2019-05-18 NOTE — Progress Notes (Signed)
RBC count, Hgb, and Hct are low and trending down.  Please notify patient and forward labs to PCP.  Creatinine is elevated-1.19 and GFR is 52.  Please advise patient to avoid NSAIDs.   Vitamin D is within desirable range. Complements WNL.  Smith negative.  RNP negative. DsDNA is negative.

## 2019-05-19 NOTE — Telephone Encounter (Signed)
Called [employer] to follow up on patient's grant application. Per rep [name], the grant closed during processing of her application. Will need to reapply once grant opens again. Called [employer], their PAP program for Prolia only applies for patients that are uninsured or have insurance but it does not cover the medication.  Ran test claim and patient's copay is $268.55 for 1 pen of Prolia.  Called patient to advise of the above and to see if she would like to move forward with scheduling her injection, no answer or voicemail. Will follow up.  11:06 AM [name], CPhT

## 2019-05-19 NOTE — Telephone Encounter (Signed)
Patient has requested that you call her back on Tuesday afternoon, as she would like to discuss this with you. Thanks!

## 2019-05-22 ENCOUNTER — Telehealth: Payer: Self-pay | Admitting: *Deleted

## 2019-05-22 NOTE — Telephone Encounter (Signed)
Labs reviewed - Per MD, not concerning. If medical concern, he would advise f/u with PCP. Contacted patent, gave her message from Dr. Irene Limbo and and reviewed lab results with her - the lab values she is concerned about are within the same range as they have been in past year. Patient verbalized understanding and expressed appreciation for call and said she has no medical concerns at this time - was only worried about labs because of call from rheumatologist. States she will see Dr. Irene Limbo at next appt.

## 2019-05-22 NOTE — Telephone Encounter (Signed)
Received faxed Telephone Advice fax from [place]. Patient called 8/21. Message states she needs to schedule a more recent appointment due to concerning blood work that she requests be reviewed by Dr. Irene Limbo. Patient asked to be be called on Tuesday 8/25 after 1pm. Patient request sent to Dr. Irene Limbo.

## 2019-05-23 ENCOUNTER — Other Ambulatory Visit: Payer: Self-pay | Admitting: Rheumatology

## 2019-05-23 MED ORDER — DENOSUMAB 60 MG/ML ~~LOC~~ SOSY: 60.0000 mg | PREFILLED_SYRINGE | SUBCUTANEOUS | 0 refills | Status: DC

## 2019-05-23 NOTE — Telephone Encounter (Signed)
Called patient, left message to call back.

## 2019-05-23 NOTE — Telephone Encounter (Signed)
Will reach out to patient this afternoon to discuss financial options.  Called [employer] to verify benefits for Reclast, in case patient decides this option:  Insurance: [employer]  Phone:  [phone]  Plan is [place] Medicare follows Medicare guidelines and covers 80% of the infusion and no authorization is required for J3489. Patient states she does not appear to have Medicare Supplement Plan, so she will be required to pay the 20% coinsurance for the infusion which will be applied to her [employer].  Ref# [id number] [place], CPhT 10:56 AM

## 2019-05-23 NOTE — Telephone Encounter (Signed)
Patient returned call and she would like to move forward with Prolia. She will pay copay and we will follow up when grants open again. Scheduled Nurse visit for 05/26/19 @ 9am. Patient also will need to pick up her original copy of her social security documents that she left at her last visit. Please send prescription to [facility].  2:14 PM [name], CPhT

## 2019-05-24 ENCOUNTER — Encounter: Payer: Self-pay | Admitting: Internal Medicine

## 2019-05-24 MED FILL — PROLIA 60 MG/ML SOLN: 60 | 180 days supply | Qty: 1 | Fill #0

## 2019-05-25 NOTE — Telephone Encounter (Signed)
Received Prolia injection for appointment on 05/26/2019.  Placed in [place].  Patient left income documents at last appointment.  Document place in bag with medication.

## 2019-05-26 ENCOUNTER — Other Ambulatory Visit: Payer: Self-pay

## 2019-05-26 ENCOUNTER — Ambulatory Visit (INDEPENDENT_AMBULATORY_CARE_PROVIDER_SITE_OTHER): Payer: Medicare HMO | Admitting: *Deleted

## 2019-05-26 VITALS — BP 147/82 | HR 70

## 2019-05-26 DIAGNOSIS — M810 Age-related osteoporosis without current pathological fracture: Secondary | ICD-10-CM

## 2019-05-26 MED ORDER — DENOSUMAB 60 MG/ML ~~LOC~~ SOSY
60.0000 mg | PREFILLED_SYRINGE | Freq: Once | SUBCUTANEOUS | Status: AC
  Administered 2019-05-26: 60 mg via SUBCUTANEOUS

## 2019-05-26 NOTE — Progress Notes (Signed)
Patient in office for a Prolia injection. Patient given injection left arm. Patient tolerated injection well. Patient will return in 10 days for labs.   Administrations This Visit    denosumab (PROLIA) injection 60 mg    Admin Date 05/26/2019 Action Given Dose 60 mg Route Subcutaneous Administered By Carole Binning, LPN

## 2019-05-26 NOTE — Patient Instructions (Signed)
Standing Labs We placed an order today for your standing lab work.    Please come back and get your standing labs in 10 days (06/06/19)  We have open lab daily Monday through Thursday from 8:30-12:30 PM and 1:30-4:30 PM and Friday from 8:30-12:30 PM and 1:30 -4:00 PM at the office of Dr. Bo Merino.   You may experience shorter wait times on Monday and Friday afternoons. The office is located at [address] [place], [place], [place] [address] No appointment is necessary.   Labs are drawn by [facility].  You may receive a bill from [place] for your lab work.  If you wish to have your labs drawn at another location, please call the office 24 hours in advance to send orders.  If you have any questions regarding directions or hours of operation,  please call [phone].   Just as a reminder please drink plenty of water prior to coming for your lab work. Thanks!

## 2019-05-30 ENCOUNTER — Other Ambulatory Visit: Payer: Self-pay | Admitting: Nurse Practitioner

## 2019-06-02 ENCOUNTER — Telehealth: Payer: Medicare HMO

## 2019-06-08 ENCOUNTER — Other Ambulatory Visit: Payer: Self-pay

## 2019-06-08 DIAGNOSIS — M3219 Other organ or system involvement in systemic lupus erythematosus: Secondary | ICD-10-CM

## 2019-06-08 DIAGNOSIS — Z79899 Other long term (current) drug therapy: Secondary | ICD-10-CM

## 2019-06-09 LAB — CBC WITH DIFFERENTIAL/PLATELET
Absolute Monocytes: 443 cells/uL (ref 200–950)
Basophils Absolute: 52 cells/uL (ref 0–200)
Basophils Relative: 1.2 %
Eosinophils Absolute: 443 cells/uL (ref 15–500)
Eosinophils Relative: 10.3 %
HCT: 31.2 % — ABNORMAL LOW (ref 35.0–45.0)
Hemoglobin: 10 g/dL — ABNORMAL LOW (ref 11.7–15.5)
Lymphs Abs: 1066 cells/uL (ref 850–3900)
MCH: 31 pg (ref 27.0–33.0)
MCHC: 32.1 g/dL (ref 32.0–36.0)
MCV: 96.6 fL (ref 80.0–100.0)
MPV: 12.2 fL (ref 7.5–12.5)
Monocytes Relative: 10.3 %
Neutro Abs: 2296 cells/uL (ref 1500–7800)
Neutrophils Relative %: 53.4 %
Platelets: 142 10*3/uL (ref 140–400)
RBC: 3.23 10*6/uL — ABNORMAL LOW (ref 3.80–5.10)
RDW: 13.2 % (ref 11.0–15.0)
Total Lymphocyte: 24.8 %
WBC: 4.3 10*3/uL (ref 3.8–10.8)

## 2019-06-09 LAB — COMPLETE METABOLIC PANEL WITH GFR
AG Ratio: 1.9 (calc) (ref 1.0–2.5)
ALT: 14 U/L (ref 6–29)
AST: 18 U/L (ref 10–35)
Albumin: 3.9 g/dL (ref 3.6–5.1)
Alkaline phosphatase (APISO): 44 U/L (ref 37–153)
BUN/Creatinine Ratio: 18 (calc) (ref 6–22)
BUN: 19 mg/dL (ref 7–25)
CO2: 27 mmol/L (ref 20–32)
Calcium: 9.2 mg/dL (ref 8.6–10.4)
Chloride: 105 mmol/L (ref 98–110)
Creat: 1.03 mg/dL — ABNORMAL HIGH (ref 0.60–0.93)
GFR, Est African American: 62 mL/min/{1.73_m2} (ref >=60)
GFR, Est Non African American: 54 mL/min/{1.73_m2} — ABNORMAL LOW (ref >=60)
Globulin: 2.1 g/dL (calc) (ref 1.9–3.7)
Glucose, Bld: 114 mg/dL — ABNORMAL HIGH (ref 65–99)
Potassium: 4.9 mmol/L (ref 3.5–5.3)
Sodium: 139 mmol/L (ref 135–146)
Total Bilirubin: 0.4 mg/dL (ref 0.2–1.2)
Total Protein: 6 g/dL — ABNORMAL LOW (ref 6.1–8.1)

## 2019-06-09 NOTE — Progress Notes (Signed)
Creatinine is borderline elevated-1.03 but improving.  GFR is WNL-62.  Glucose is 114.   RBC count, hgb, and hct are low.  Please notify patient and forward labs to PCP.

## 2019-06-14 ENCOUNTER — Ambulatory Visit: Payer: Medicare HMO | Admitting: Internal Medicine

## 2019-06-14 ENCOUNTER — Ambulatory Visit: Payer: Medicare HMO

## 2019-06-14 ENCOUNTER — Encounter: Payer: Medicare HMO | Admitting: Internal Medicine

## 2019-06-15 ENCOUNTER — Encounter: Payer: Self-pay | Admitting: Internal Medicine

## 2019-06-15 ENCOUNTER — Ambulatory Visit: Payer: Medicare HMO

## 2019-06-15 ENCOUNTER — Other Ambulatory Visit: Payer: Self-pay

## 2019-06-15 ENCOUNTER — Ambulatory Visit (INDEPENDENT_AMBULATORY_CARE_PROVIDER_SITE_OTHER): Payer: Medicare HMO | Admitting: Internal Medicine

## 2019-06-15 ENCOUNTER — Ambulatory Visit (INDEPENDENT_AMBULATORY_CARE_PROVIDER_SITE_OTHER): Payer: Medicare HMO

## 2019-06-15 ENCOUNTER — Encounter: Payer: Medicare HMO | Admitting: Internal Medicine

## 2019-06-15 VITALS — BP 152/82 | HR 96 | Temp 98.5°F | Ht <= 58 in | Wt 158.6 lb

## 2019-06-15 DIAGNOSIS — Z6833 Body mass index (BMI) 33.0-33.9, adult: Secondary | ICD-10-CM

## 2019-06-15 DIAGNOSIS — Z23 Encounter for immunization: Secondary | ICD-10-CM | POA: Diagnosis not present

## 2019-06-15 DIAGNOSIS — E039 Hypothyroidism, unspecified: Secondary | ICD-10-CM | POA: Diagnosis not present

## 2019-06-15 DIAGNOSIS — N183 Chronic kidney disease, stage 3 unspecified: Secondary | ICD-10-CM

## 2019-06-15 DIAGNOSIS — Z0000 Encounter for general adult medical examination without abnormal findings: Secondary | ICD-10-CM

## 2019-06-15 DIAGNOSIS — E6609 Other obesity due to excess calories: Secondary | ICD-10-CM | POA: Diagnosis not present

## 2019-06-15 DIAGNOSIS — Z0001 Encounter for general adult medical examination with abnormal findings: Secondary | ICD-10-CM

## 2019-06-15 DIAGNOSIS — I129 Hypertensive chronic kidney disease with stage 1 through stage 4 chronic kidney disease, or unspecified chronic kidney disease: Secondary | ICD-10-CM

## 2019-06-15 DIAGNOSIS — M1A3790 Chronic gout due to renal impairment, unspecified ankle and foot, without tophus (tophi): Secondary | ICD-10-CM | POA: Diagnosis not present

## 2019-06-15 DIAGNOSIS — E1122 Type 2 diabetes mellitus with diabetic chronic kidney disease: Secondary | ICD-10-CM | POA: Diagnosis not present

## 2019-06-15 LAB — POCT UA - MICROALBUMIN
Creatinine, POC: 100 mg/dL
Microalbumin Ur, POC: 80 mg/L

## 2019-06-15 LAB — POCT URINALYSIS DIPSTICK
Bilirubin, UA: NEGATIVE
Blood, UA: NEGATIVE
Glucose, UA: NEGATIVE
Ketones, UA: NEGATIVE
Leukocytes, UA: NEGATIVE
Nitrite, UA: NEGATIVE
Protein, UA: POSITIVE — AB
Spec Grav, UA: 1.02 (ref 1.010–1.025)
Urobilinogen, UA: 0.2 E.U./dL
pH, UA: 7 (ref 5.0–8.0)

## 2019-06-15 MED ORDER — BOOSTRIX 5-2.5-18.5 LF-MCG/0.5 IM SUSP: 0.5000 mL | Freq: Once | INTRAMUSCULAR | 0 refills | Status: AC

## 2019-06-15 MED ORDER — AMLODIPINE BESYLATE 5 MG PO TABS: 5.0000 mg | ORAL_TABLET | Freq: Every day | ORAL | 2 refills | Status: DC

## 2019-06-15 NOTE — Progress Notes (Signed)
Subjective:   Hannah [name] is a 73 y.o. female who presents for Medicare Annual (Subsequent) preventive examination.  Review of Systems:  n/a Cardiac Risk Factors include: advanced age (>75men, >27 women);diabetes mellitus     Objective:     Vitals: BP (!) 152/82   Pulse 96   Temp 98.5 F (36.9 C) (Oral)   Ht 4' 9.6" (1.463 m)   Wt 158 lb 9.6 oz (71.9 kg)   BMI 33.61 kg/m   Body mass index is 33.61 kg/m.  Advanced Directives 06/15/2019 06/20/2018 02/23/2018 08/12/2017 02/16/2017 08/03/2016 02/03/2016  Does Patient Have a Medical Advance Directive? Yes Yes Yes Yes Yes Yes Yes  Type of Paramedic of [place];Living will [place];Living will [place];Living will [place];Living will [place];Living will Healthcare Power of [place]  Does patient want to make changes to medical advance directive? - No - Patient declined - - - - -  Copy of [place] in Chart? No - copy requested Yes Yes Yes Yes Yes Yes    Tobacco Social History   Tobacco Use  Smoking Status Never Smoker  Smokeless Tobacco Never Used     Counseling given: Not Answered   Clinical Intake:  Pre-visit preparation completed: Yes  Pain : No/denies pain     Nutritional Status: BMI > 30  Obese Nutritional Risks: None Diabetes: Yes CBG done?: No Did pt. bring in CBG monitor from home?: No  How often do you need to have someone help you when you read instructions, pamphlets, or other written materials from your doctor or pharmacy?: 1 - Never What is the last grade level you completed in school?: college  Interpreter Needed?: No  Information entered by :: NAllen LPN  Past Medical History:  Diagnosis Date  . Arthritis   . Asthma   . Diabetes mellitus without complication ([place])   . Gout 12/17/2014   patient reported  . Hyperlipidemia   . Hypertension     Past Surgical History:  Procedure Laterality Date  . CATARACT EXTRACTION Bilateral 2015  . DILATION AND CURETTAGE OF UTERUS    . DOPPLER ECHOCARDIOGRAPHY  05/2018   Internist to review with pt; potential heart murmur 06/20/18  . SKIN SURGERY  11/30/2018   left side of face   Family History  Problem Relation Age of Onset  . Heart disease Father   . Diabetes Father   . Hypertension Mother   . Hypertension Sister   . Hypertension Sister   . Leukemia Sister    Social History   Socioeconomic History  . Marital status: Single    Spouse name: Not on file  . Number of children: 0  . Years of education: Not on file  . Highest education level: Not on file  Occupational History  . Occupation: retired  Scientific laboratory technician  . Financial resource strain: Not hard at all  . Food insecurity    Worry: Never true    Inability: Never true  . Transportation needs    Medical: No    Non-medical: No  Tobacco Use  . Smoking status: Never Smoker  . Smokeless tobacco: Never Used  Substance and Sexual Activity  . Alcohol use: Yes    Comment: rarely   . Drug use: No  . Sexual activity: Not Currently    Comment: 1ST intercourse- 69 , partners- 6,   Lifestyle  . Physical activity  Days per week: 4 days    Minutes per session: 30 min  . Stress: To some extent  Relationships  . Social Herbalist on phone: Not on file    Gets together: Not on file    Attends religious service: Not on file    Active member of club or organization: Not on file    Attends meetings of clubs or organizations: Not on file    Relationship status: Not on file  Other Topics Concern  . Not on file  Social History Narrative  . Not on file    Outpatient Encounter Medications as of 06/15/2019  Medication Sig  . albuterol (PROVENTIL HFA;VENTOLIN HFA) 108 (90 Base) MCG/ACT inhaler Inhale 2 puffs into the lungs every 6 (six) hours as needed for wheezing or shortness of breath.  . allopurinol (ZYLOPRIM) 100  MG tablet TAKE 1 TABLET BY MOUTH EVERY DAY FOR GOUT  . amLODipine (NORVASC) 2.5 MG tablet TAKE 1 TABLET BY MOUTH EVERY DAY  . aspirin EC 81 MG tablet Take 81 mg by mouth daily.  [name] atorvastatin (LIPITOR) 80 MG tablet TAKE 1 TABLET BY MOUTH EVERY DAY  . budesonide-formoterol (SYMBICORT) 160-4.5 MCG/ACT inhaler INHALE 2 PUFFS BY MOUTH TWICE DAILY IN THE MORNING AND EVENING  . Calcium Carb-Cholecalciferol (CALCIUM 600+D) 600-800 MG-UNIT TABS Take by mouth 2 (two) times daily.   . Cholecalciferol (VITAMIN D PO) Take 1,000 Units by mouth daily.   [name] denosumab (PROLIA) 60 MG/ML SOSY injection Inject 60 mg into the skin every 6 (six) months.  . hydroxychloroquine (PLAQUENIL) 200 MG tablet TAKE 1 TABLET BY MOUTH DAILY FOR RHEUMATOID ARTHRITIS  . Hypromellose (ARTIFICIAL TEARS OP) Apply to eye as needed.  . irbesartan (AVAPRO) 300 MG tablet Take 1 tablet (300 mg total) by mouth daily.  . iron polysaccharides (NIFEREX) 150 MG capsule Take 1 capsule (150 mg total) by mouth daily.  . metFORMIN (GLUCOPHAGE) 500 MG tablet TAKE 1 TABLET BY MOUTH TWICE DAILY WITH THE MORNING AND EVENING MEAL  . SYNTHROID 88 MCG tablet TAKE 1 TABLET BY MOUTH EVERY DAY MONDAY TO SATURDAY AND OFF SUNDAYS   No facility-administered encounter medications on file as of 06/15/2019.     Activities of Daily Living In your present state of health, do you have any difficulty performing the following activities: 06/15/2019  Hearing? N  Vision? N  Difficulty concentrating or making decisions? N  Walking or climbing stairs? Y  Comment due to osteoarthritis  Dressing or bathing? N  Doing errands, shopping? N  Preparing Food and eating ? N  Using the Toilet? N  In the past six months, have you accidently leaked urine? N  Do you have problems with loss of bowel control? N  Managing your Medications? N  Managing your Finances? N  Housekeeping or managing your Housekeeping? N  Some recent data might be hidden    Patient Care Team:  Glendale Chard, MD as PCP - General (Internal Medicine) Rex Kras, Claudette Stapler, RN as [address] Management    Assessment:   This is a routine wellness examination for Hannah [name].  Exercise Activities and Dietary recommendations Current Exercise Habits: Home exercise routine, Type of exercise: walking, Time (Minutes): 30, Frequency (Times/Week): 4, Weekly Exercise (Minutes/Week): 120  Goals    . Patient Stated     06/15/2019, wants to get off some medications       Fall Risk Fall Risk  06/15/2019 06/15/2019 02/23/2019 10/25/2018 07/21/2018  Falls in the past  year? 0 0 0 0 No  Risk for fall due to : Medication side effect - - - -  Follow up Falls evaluation completed;Education provided;Falls prevention discussed - - - -   Is the patient's home free of loose throw rugs in walkways, pet beds, electrical cords, etc?   yes      Grab bars in the bathroom? yes      Handrails on the stairs?   n/a      Adequate lighting?   yes  Timed Get Up and Go performed: n/a  Depression Screen PHQ 2/9 Scores 06/15/2019 06/15/2019 02/23/2019 10/25/2018  PHQ - 2 Score 0 0 0 0  PHQ- 9 Score 6 - - -     Cognitive Function     6CIT Screen 06/15/2019  What Year? 0 points  What month? 0 points  What time? 0 points  Count back from 20 0 points  Months in reverse 2 points  Repeat phrase 0 points  Total Score 2    Immunization History  Administered Date(s) Administered  . 19-influenza Whole 07/15/2012  . Influenza Split 06/28/2014  . Influenza, High Dose Seasonal PF 05/18/2019  . Influenza,inj,Quad PF,6+ Mos 06/20/2018  . Pneumococcal Conjugate-13 05/04/2018  . Pneumococcal Polysaccharide-23 05/18/2019  . Pneumococcal-Unspecified 06/28/2014    Qualifies for Shingles Vaccine? yes  Screening Tests Health Maintenance  Topic Date Due  . TETANUS/TDAP  08/13/1964  . MAMMOGRAM  02/27/2021  . COLONOSCOPY  03/21/2023  . INFLUENZA VACCINE  Completed  . DEXA SCAN  Completed  . Hepatitis C  Screening  Completed  . PNA vac Low Risk Adult  Completed    Cancer Screenings: Lung: Low Dose CT Chest recommended if Age 49-80 years, 30 pack-year currently smoking OR have quit w/in 15years. Patient does not qualify. Breast:  Up to date on Mammogram? Yes   Up to date of Bone Density/Dexa? Yes Colorectal: up to date  Additional Screenings: : Hepatitis C Screening: 02/2017     Plan:    Patient would like to get off some medications.   I have personally reviewed and noted the following in the patient's chart:   . Medical and social history . Use of alcohol, tobacco or illicit drugs  . Current medications and supplements . Functional ability and status . Nutritional status . Physical activity . Advanced directives . List of other physicians . Hospitalizations, surgeries, and ER visits in previous 12 months . Vitals . Screenings to include cognitive, depression, and falls . Referrals and appointments  In addition, I have reviewed and discussed with patient certain preventive protocols, quality metrics, and best practice recommendations. A written personalized care plan for preventive services as well as general preventive health recommendations were provided to patient.     Kellie Simmering, LPN  [id number]

## 2019-06-15 NOTE — Patient Instructions (Signed)
Ms. Hannah [name] , Thank you for taking time to come for your Medicare Wellness Visit. I appreciate your ongoing commitment to your health goals. Please review the following plan we discussed and let me know if I can assist you in the future.   Screening recommendations/referrals: Colonoscopy: 02/2013 Mammogram: 02/2019 Bone Density: 07/2018 Recommended yearly ophthalmology/optometry visit for glaucoma screening and checkup Recommended yearly dental visit for hygiene and checkup  Vaccinations: Influenza vaccine: 04/2019 Pneumococcal vaccine: 04/2019 Tdap vaccine: sent to pharmacy Shingles vaccine: discussed    Advanced directives: Please bring a copy of your POA (Power of Boyd) and/or Living Will to your next appointment.    Conditions/risks identified: obesity  Next appointment: 10/31/2019 at 9:30   Preventive Care 27 Years and Older, Female Preventive care refers to lifestyle choices and visits with your health care provider that can promote health and wellness. What does preventive care include?  A yearly physical exam. This is also called an annual well check.  Dental exams once or twice a year.  Routine eye exams. Ask your health care provider how often you should have your eyes checked.  Personal lifestyle choices, including:  Daily care of your teeth and gums.  Regular physical activity.  Eating a healthy diet.  Avoiding tobacco and drug use.  Limiting alcohol use.  Practicing safe sex.  Taking low-dose aspirin every day.  Taking vitamin and mineral supplements as recommended by your health care provider. What happens during an annual well check? The services and screenings done by your health care provider during your annual well check will depend on your age, overall health, lifestyle risk factors, and family history of disease. Counseling  Your health care provider may ask you questions about your:  Alcohol use.  Tobacco use.  Drug use.  Emotional  well-being.  Home and relationship well-being.  Sexual activity.  Eating habits.  History of falls.  Memory and ability to understand (cognition).  Work and work Statistician.  Reproductive health. Screening  You may have the following tests or measurements:  Height, weight, and BMI.  Blood pressure.  Lipid and cholesterol levels. These may be checked every 5 years, or more frequently if you are over 30 years old.  Skin check.  Lung cancer screening. You may have this screening every year starting at age 73 if you have a 30-pack-year history of smoking and currently smoke or have quit within the past 15 years.  Fecal occult blood test (FOBT) of the stool. You may have this test every year starting at age 20.  Flexible sigmoidoscopy or colonoscopy. You may have a sigmoidoscopy every 5 years or a colonoscopy every 10 years starting at age 2.  Hepatitis C blood test.  Hepatitis B blood test.  Sexually transmitted disease (STD) testing.  Diabetes screening. This is done by checking your blood sugar (glucose) after you have not eaten for a while (fasting). You may have this done every 1-3 years.  Bone density scan. This is done to screen for osteoporosis. You may have this done starting at age 82.  Mammogram. This may be done every 1-2 years. Talk to your health care provider about how often you should have regular mammograms. Talk with your health care provider about your test results, treatment options, and if necessary, the need for more tests. Vaccines  Your health care provider may recommend certain vaccines, such as:  Influenza vaccine. This is recommended every year.  Tetanus, diphtheria, and acellular pertussis (Tdap, Td) vaccine. You may need a  Td booster every 10 years.  Zoster vaccine. You may need this after age 75.  Pneumococcal 13-valent conjugate (PCV13) vaccine. One dose is recommended after age 65.  Pneumococcal polysaccharide (PPSV23) vaccine. One  dose is recommended after age 47. Talk to your health care provider about which screenings and vaccines you need and how often you need them. This information is not intended to replace advice given to you by your health care provider. Make sure you discuss any questions you have with your health care provider. Document Released: 10/11/2015 Document Revised: 06/03/2016 Document Reviewed: 07/16/2015 Elsevier Interactive Patient Education  2017 [place] Prevention in the Home Falls can cause injuries. They can happen to people of all ages. There are many things you can do to make your home safe and to help prevent falls. What can I do on the outside of my home?  Regularly fix the edges of walkways and driveways and fix any cracks.  Remove anything that might make you trip as you walk through a door, such as a raised step or threshold.  Trim any bushes or trees on the path to your home.  Use bright outdoor lighting.  Clear any walking paths of anything that might make someone trip, such as rocks or tools.  Regularly check to see if handrails are loose or broken. Make sure that both sides of any steps have handrails.  Any raised decks and porches should have guardrails on the edges.  Have any leaves, snow, or ice cleared regularly.  Use sand or salt on walking paths during winter.  Clean up any spills in your garage right away. This includes oil or grease spills. What can I do in the bathroom?  Use night lights.  Install grab bars by the toilet and in the tub and shower. Do not use towel bars as grab bars.  Use non-skid mats or decals in the tub or shower.  If you need to sit down in the shower, use a plastic, non-slip stool.  Keep the floor dry. Clean up any water that spills on the floor as soon as it happens.  Remove soap buildup in the tub or shower regularly.  Attach bath mats securely with double-sided non-slip rug tape.  Do not have throw rugs and other  things on the floor that can make you trip. What can I do in the bedroom?  Use night lights.  Make sure that you have a light by your bed that is easy to reach.  Do not use any sheets or blankets that are too big for your bed. They should not hang down onto the floor.  Have a firm chair that has side arms. You can use this for support while you get dressed.  Do not have throw rugs and other things on the floor that can make you trip. What can I do in the kitchen?  Clean up any spills right away.  Avoid walking on wet floors.  Keep items that you use a lot in easy-to-reach places.  If you need to reach something above you, use a strong step stool that has a grab bar.  Keep electrical cords out of the way.  Do not use floor polish or wax that makes floors slippery. If you must use wax, use non-skid floor wax.  Do not have throw rugs and other things on the floor that can make you trip. What can I do with my stairs?  Do not leave any items on the stairs.  Make sure that there are handrails on both sides of the stairs and use them. Fix handrails that are broken or loose. Make sure that handrails are as long as the stairways.  Check any carpeting to make sure that it is firmly attached to the stairs. Fix any carpet that is loose or worn.  Avoid having throw rugs at the top or bottom of the stairs. If you do have throw rugs, attach them to the floor with carpet tape.  Make sure that you have a light switch at the top of the stairs and the bottom of the stairs. If you do not have them, ask someone to add them for you. What else can I do to help prevent falls?  Wear shoes that:  Do not have high heels.  Have rubber bottoms.  Are comfortable and fit you well.  Are closed at the toe. Do not wear sandals.  If you use a stepladder:  Make sure that it is fully opened. Do not climb a closed stepladder.  Make sure that both sides of the stepladder are locked into place.  Ask  someone to hold it for you, if possible.  Clearly mark and make sure that you can see:  Any grab bars or handrails.  First and last steps.  Where the edge of each step is.  Use tools that help you move around (mobility aids) if they are needed. These include:  Canes.  Walkers.  Scooters.  Crutches.  Turn on the lights when you go into a dark area. Replace any light bulbs as soon as they burn out.  Set up your furniture so you have a clear path. Avoid moving your furniture around.  If any of your floors are uneven, fix them.  If there are any pets around you, be aware of where they are.  Review your medicines with your doctor. Some medicines can make you feel dizzy. This can increase your chance of falling. Ask your doctor what other things that you can do to help prevent falls. This information is not intended to replace advice given to you by your health care provider. Make sure you discuss any questions you have with your health care provider. Document Released: 07/11/2009 Document Revised: 02/20/2016 Document Reviewed: 10/19/2014 Elsevier Interactive Patient Education  2017 [employer].

## 2019-06-16 LAB — HEMOGLOBIN A1C
Est. average glucose Bld gHb Est-mCnc: 114 mg/dL
Hgb A1c MFr Bld: 5.6 % (ref 4.8–5.6)

## 2019-06-16 LAB — LIPID PANEL
Chol/HDL Ratio: 2 ratio (ref 0.0–4.4)
Cholesterol, Total: 126 mg/dL (ref 100–199)
HDL: 62 mg/dL (ref >39)
LDL Chol Calc (NIH): 53 mg/dL (ref 0–99)
Triglycerides: 45 mg/dL (ref 0–149)
VLDL Cholesterol Cal: 11 mg/dL (ref 5–40)

## 2019-06-16 LAB — TSH: TSH: 0.74 u[IU]/mL (ref 0.450–4.500)

## 2019-06-16 LAB — URIC ACID: Uric Acid: 4.3 mg/dL (ref 2.5–7.1)

## 2019-06-18 NOTE — Patient Instructions (Signed)
Health Maintenance, Female Adopting a healthy lifestyle and getting preventive care are important in promoting health and wellness. Ask your health care provider about:  The right schedule for you to have regular tests and exams.  Things you can do on your own to prevent diseases and keep yourself healthy. What should I know about diet, weight, and exercise? Eat a healthy diet   Eat a diet that includes plenty of vegetables, fruits, low-fat dairy products, and lean protein.  Do not eat a lot of foods that are high in solid fats, added sugars, or sodium. Maintain a healthy weight Body mass index (BMI) is used to identify weight problems. It estimates body fat based on height and weight. Your health care provider can help determine your BMI and help you achieve or maintain a healthy weight. Get regular exercise Get regular exercise. This is one of the most important things you can do for your health. Most adults should:  Exercise for at least 150 minutes each week. The exercise should increase your heart rate and make you sweat (moderate-intensity exercise).  Do strengthening exercises at least twice a week. This is in addition to the moderate-intensity exercise.  Spend less time sitting. Even light physical activity can be beneficial. Watch cholesterol and blood lipids Have your blood tested for lipids and cholesterol at 74 years of age, then have this test every 5 years. Have your cholesterol levels checked more often if:  Your lipid or cholesterol levels are high.  You are older than 74 years of age.  You are at high risk for heart disease. What should I know about cancer screening? Depending on your health history and family history, you may need to have cancer screening at various ages. This may include screening for:  Breast cancer.  Cervical cancer.  Colorectal cancer.  Skin cancer.  Lung cancer. What should I know about heart disease, diabetes, and high blood  pressure? Blood pressure and heart disease  High blood pressure causes heart disease and increases the risk of stroke. This is more likely to develop in people who have high blood pressure readings, are of African descent, or are overweight.  Have your blood pressure checked: ? Every 3-5 years if you are 18-39 years of age. ? Every year if you are 40 years old or older. Diabetes Have regular diabetes screenings. This checks your fasting blood sugar level. Have the screening done:  Once every three years after age 40 if you are at a normal weight and have a low risk for diabetes.  More often and at a younger age if you are overweight or have a high risk for diabetes. What should I know about preventing infection? Hepatitis B If you have a higher risk for hepatitis B, you should be screened for this virus. Talk with your health care provider to find out if you are at risk for hepatitis B infection. Hepatitis C Testing is recommended for:  Everyone born from 1945 through 1965.  Anyone with known risk factors for hepatitis C. Sexually transmitted infections (STIs)  Get screened for STIs, including gonorrhea and chlamydia, if: ? You are sexually active and are younger than 74 years of age. ? You are older than 74 years of age and your health care provider tells you that you are at risk for this type of infection. ? Your sexual activity has changed since you were last screened, and you are at increased risk for chlamydia or gonorrhea. Ask your health care provider if   you are at risk.  Ask your health care provider about whether you are at high risk for HIV. Your health care provider may recommend a prescription medicine to help prevent HIV infection. If you choose to take medicine to prevent HIV, you should first get tested for HIV. You should then be tested every 3 months for as long as you are taking the medicine. Pregnancy  If you are about to stop having your period (premenopausal) and  you may become pregnant, seek counseling before you get pregnant.  Take 400 to 800 micrograms (mcg) of folic acid every day if you become pregnant.  Ask for birth control (contraception) if you want to prevent pregnancy. Osteoporosis and menopause Osteoporosis is a disease in which the bones lose minerals and strength with aging. This can result in bone fractures. If you are 65 years old or older, or if you are at risk for osteoporosis and fractures, ask your health care provider if you should:  Be screened for bone loss.  Take a calcium or vitamin D supplement to lower your risk of fractures.  Be given hormone replacement therapy (HRT) to treat symptoms of menopause. Follow these instructions at home: Lifestyle  Do not use any products that contain nicotine or tobacco, such as cigarettes, e-cigarettes, and chewing tobacco. If you need help quitting, ask your health care provider.  Do not use street drugs.  Do not share needles.  Ask your health care provider for help if you need support or information about quitting drugs. Alcohol use  Do not drink alcohol if: ? Your health care provider tells you not to drink. ? You are pregnant, may be pregnant, or are planning to become pregnant.  If you drink alcohol: ? Limit how much you use to 0-1 drink a day. ? Limit intake if you are breastfeeding.  Be aware of how much alcohol is in your drink. In the [place] one drink equals one 12 oz bottle of beer (355 mL), one 5 oz glass of wine (148 mL), or one 1 oz glass of hard liquor (44 mL). General instructions  Schedule regular health, dental, and eye exams.  Stay current with your vaccines.  Tell your health care provider if: ? You often feel depressed. ? You have ever been abused or do not feel safe at home. Summary  Adopting a healthy lifestyle and getting preventive care are important in promoting health and wellness.  Follow your health care provider's instructions about healthy  diet, exercising, and getting tested or screened for diseases.  Follow your health care provider's instructions on monitoring your cholesterol and blood pressure. This information is not intended to replace advice given to you by your health care provider. Make sure you discuss any questions you have with your health care provider. Document Released: 03/30/2011 Document Revised: 09/07/2018 Document Reviewed: 09/07/2018 Elsevier Patient Education  2020 [employer]  

## 2019-06-18 NOTE — Progress Notes (Addendum)
Subjective:     Patient ID: Hannah [name] , female    DOB: 08/09/1946 , 73 y.o.   MRN: [id number]   Chief Complaint  Patient presents with  . Annual Exam  . Diabetes  . Hypertension    HPI  She is here today for a full physical examination. She is followed by Dr. Dellis Filbert for her GYN care. She has an appointment scheduled later this year. She has no specific concerns or complaints at this time.   Diabetes She presents for her follow-up diabetic visit. She has type 2 diabetes mellitus. There are no hypoglycemic associated symptoms. Pertinent negatives for hypoglycemia include no headaches. Pertinent negatives for diabetes include no blurred vision and no chest pain. There are no hypoglycemic complications. Diabetic complications include nephropathy. Risk factors for coronary artery disease include diabetes mellitus, dyslipidemia, hypertension, post-menopausal and sedentary lifestyle. She is following a diabetic diet. Meal planning includes avoidance of concentrated sweets. She participates in exercise three times a week. An ACE inhibitor/angiotensin II receptor blocker is being taken. Eye exam is current.  Hypertension This is a chronic problem. The current episode started more than 1 year ago. The problem has been gradually improving since onset. The problem is controlled. Pertinent negatives include no blurred vision, chest pain or headaches. Risk factors for coronary artery disease include diabetes mellitus, dyslipidemia and post-menopausal state. Past treatments include angiotensin blockers and calcium channel blockers. The current treatment provides moderate improvement. Hypertensive end-organ damage includes kidney disease.     Past Medical History:  Diagnosis Date  . Arthritis   . Asthma   . Diabetes mellitus without complication ([place])   . Gout 12/17/2014   patient reported  . Hyperlipidemia   . Hypertension      Family History  Problem Relation Age of Onset  . Heart  disease Father   . Diabetes Father   . Hypertension Mother   . Hypertension Sister   . Hypertension Sister   . Leukemia Sister      Current Outpatient Medications:  .  albuterol (PROVENTIL HFA;VENTOLIN HFA) 108 (90 Base) MCG/ACT inhaler, Inhale 2 puffs into the lungs every 6 (six) hours as needed for wheezing or shortness of breath., Disp: 1 Inhaler, Rfl: 2 .  allopurinol (ZYLOPRIM) 100 MG tablet, TAKE 1 TABLET BY MOUTH EVERY DAY FOR GOUT, Disp: 90 tablet, Rfl: 1 .  aspirin EC 81 MG tablet, Take 81 mg by mouth daily., Disp: , Rfl:  .  atorvastatin (LIPITOR) 80 MG tablet, TAKE 1 TABLET BY MOUTH EVERY DAY, Disp: 90 tablet, Rfl: 1 .  budesonide-formoterol (SYMBICORT) 160-4.5 MCG/ACT inhaler, INHALE 2 PUFFS BY MOUTH TWICE DAILY IN THE MORNING AND EVENING, Disp: 10.2 g, Rfl: 11 .  Calcium Carb-Cholecalciferol (CALCIUM 600+D) 600-800 MG-UNIT TABS, Take by mouth 2 (two) times daily. , Disp: , Rfl:  .  Cholecalciferol (VITAMIN D PO), Take 1,000 Units by mouth daily. , Disp: , Rfl:  .  denosumab (PROLIA) 60 MG/ML SOSY injection, Inject 60 mg into the skin every 6 (six) months., Disp: 1 mL, Rfl: 0 .  hydroxychloroquine (PLAQUENIL) 200 MG tablet, TAKE 1 TABLET BY MOUTH DAILY FOR RHEUMATOID ARTHRITIS, Disp: 90 tablet, Rfl: 0 .  Hypromellose (ARTIFICIAL TEARS OP), Apply to eye as needed., Disp: , Rfl:  .  irbesartan (AVAPRO) 300 MG tablet, Take 1 tablet (300 mg total) by mouth daily., Disp: 90 tablet, Rfl: 2 .  iron polysaccharides (NIFEREX) 150 MG capsule, Take 1 capsule (150 mg total) by mouth daily., Disp:  60 capsule, Rfl: 3 .  metFORMIN (GLUCOPHAGE) 500 MG tablet, TAKE 1 TABLET BY MOUTH TWICE DAILY WITH THE MORNING AND EVENING MEAL, Disp: 180 tablet, Rfl: 0 .  SYNTHROID 88 MCG tablet, TAKE 1 TABLET BY MOUTH EVERY DAY MONDAY TO SATURDAY AND OFF SUNDAYS, Disp: 90 tablet, Rfl: 0 .  amLODipine (NORVASC) 5 MG tablet, Take 1 tablet (5 mg total) by mouth daily., Disp: 90 tablet, Rfl: 2   Allergies   Allergen Reactions  . Shellfish Allergy Anaphylaxis and Hives      The patient states she uses post menopausal status for birth control. Last LMP was No LMP recorded. Patient is postmenopausal.. Negative for Dysmenorrhea  Negative for: breast discharge, breast lump(s), breast pain and breast self exam. Associated symptoms include abnormal vaginal bleeding. Pertinent negatives include abnormal bleeding (hematology), anxiety, decreased libido, depression, difficulty falling sleep, dyspareunia, history of infertility, nocturia, sexual dysfunction, sleep disturbances, urinary incontinence, urinary urgency, vaginal discharge and vaginal itching. Diet regular.The patient states her exercise level is  moderate.   . The patient's tobacco use is:  Social History   Tobacco Use  Smoking Status Never Smoker  Smokeless Tobacco Never Used  . She has been exposed to passive smoke. The patient's alcohol use is:  Social History   Substance and Sexual Activity  Alcohol Use Yes   Comment: rarely     Review of Systems  Constitutional: Negative.   HENT: Negative.   Eyes: Negative.  Negative for blurred vision.  Respiratory: Negative.   Cardiovascular: Negative.  Negative for chest pain.  Endocrine: Negative.   Genitourinary: Negative.   Musculoskeletal: Negative.   Skin: Negative.   Allergic/Immunologic: Negative.   Neurological: Negative.  Negative for headaches.  Hematological: Negative.   Psychiatric/Behavioral: Negative.      Today's Vitals   06/15/19 1050  BP: (!) 152/82  Pulse: 96  Temp: 98.5 F (36.9 C)  TempSrc: Oral  SpO2: 98%  Weight: 158 lb 9.6 oz (71.9 kg)  Height: 4' 9.6" (1.463 m)   Body mass index is 33.61 kg/m.   Objective:  Physical Exam Vitals signs and nursing note reviewed.  Constitutional:      Appearance: Normal appearance. She is obese.  HENT:     Head: Normocephalic and atraumatic.     Right Ear: Tympanic membrane, ear canal and external ear normal.      Left Ear: Tympanic membrane, ear canal and external ear normal.     Nose: Nose normal.     Mouth/Throat:     Mouth: Mucous membranes are moist.     Pharynx: Oropharynx is clear.  Eyes:     Extraocular Movements: Extraocular movements intact.     Conjunctiva/sclera: Conjunctivae normal.     Pupils: Pupils are equal, round, and reactive to light.  Neck:     Musculoskeletal: Normal range of motion and neck supple.  Cardiovascular:     Rate and Rhythm: Normal rate and regular rhythm.     Pulses:          Dorsalis pedis pulses are 2+ on the right side and 2+ on the left side.       Posterior tibial pulses are 1+ on the right side and 1+ on the left side.     Heart sounds: Normal heart sounds.  Pulmonary:     Effort: Pulmonary effort is normal.     Breath sounds: Normal breath sounds.  Chest:     Breasts: Tanner Score is 5.  Right: Normal. No swelling, bleeding, inverted nipple, mass, nipple discharge or skin change.        Left: Normal. No swelling, bleeding, inverted nipple, mass, nipple discharge or skin change.  Abdominal:     General: Abdomen is flat. Bowel sounds are normal.     Palpations: Abdomen is soft.  Genitourinary:    Comments: deferred Musculoskeletal: Normal range of motion.  Feet:     Right foot:     Protective Sensation: 5 sites tested. 5 sites sensed.     Skin integrity: Skin integrity normal.     Toenail Condition: Right toenails are normal.     Left foot:     Protective Sensation: 5 sites tested. 5 sites sensed.     Skin integrity: Skin integrity normal.     Toenail Condition: Left toenails are normal.  Skin:    General: Skin is warm and dry.  Neurological:     General: No focal deficit present.     Mental Status: She is alert and oriented to person, place, and time.  Psychiatric:        Mood and Affect: Mood normal.        Behavior: Behavior normal.         Assessment And Plan:     1. Routine general medical examination at health care  facility  A full exam was performed.  Importance of monthly self breast exams was discussed with the patient. PATIENT HAS BEEN ADVISED TO GET 30-45 MINUTES REGULAR EXERCISE NO LESS THAN FOUR TO FIVE DAYS PER WEEK - BOTH WEIGHTBEARING EXERCISES AND AEROBIC ARE RECOMMENDED.  SHE WAS ADVISED TO FOLLOW A HEALTHY DIET WITH AT LEAST SIX FRUITS/VEGGIES PER DAY, DECREASE INTAKE OF RED MEAT, AND TO INCREASE FISH INTAKE TO TWO DAYS PER WEEK.  MEATS/FISH SHOULD NOT BE FRIED, BAKED OR BROILED IS PREFERABLE.  I SUGGEST WEARING SPF 50 SUNSCREEN ON EXPOSED PARTS AND ESPECIALLY WHEN IN THE DIRECT SUNLIGHT FOR AN EXTENDED PERIOD OF TIME.  PLEASE AVOID FAST FOOD RESTAURANTS AND INCREASE YOUR WATER INTAKE.  - POCT Urinalysis Dipstick (81002) - POCT UA - Microalbumin  2. Diabetes mellitus with stage 3 chronic kidney disease (HCC)  Diabetic foot exam was performed.  I DISCUSSED WITH THE PATIENT AT LENGTH REGARDING THE GOALS OF GLYCEMIC CONTROL AND POSSIBLE LONG-TERM COMPLICATIONS.  I  ALSO STRESSED THE IMPORTANCE OF COMPLIANCE WITH HOME GLUCOSE MONITORING, DIETARY RESTRICTIONS INCLUDING AVOIDANCE OF SUGARY DRINKS/PROCESSED FOODS,  ALONG WITH REGULAR EXERCISE.  I  ALSO STRESSED THE IMPORTANCE OF ANNUAL EYE EXAMS, SELF FOOT CARE AND COMPLIANCE WITH OFFICE VISITS.  - Lipid panel - Hemoglobin A1c - POCT Urinalysis Dipstick (81002) - POCT UA - Microalbumin  3. Hypertensive nephropathy  Chronic, uncontrolled. I will increase her dose of amlodipine to 5mg  daily.  She was advised of possible side effects including LE swelling. She verbally acknowledges her treatment plan and all questions were answered to her satisfaction.  She will rto in six weeks for re-evaluation.  EKG performed, no acute changes noted.   - EKG 12-Lead - POCT Urinalysis Dipstick (81002) - POCT UA - Microalbumin  4. Primary hypothyroidism  I will check thyroid panel and adjust meds as needed.  - TSH  5. Chronic gout due to renal impairment  involving foot without tophus, unspecified laterality  Chronic, also followed by Rheumatology. I will check uric acid level today.   - Uric acid  6. Class 1 obesity due to excess calories without serious comorbidity with body mass index (BMI)  of 33.0 to 33.9 in adult  She is encouraged to strive for BMI less than 30 to decrease cardiac risk.  She is encouraged to exercise no less than 150 minutes per week and to avoid processed foods and sugary beverages.   [name], MD    THE PATIENT IS ENCOURAGED TO PRACTICE SOCIAL DISTANCING DUE TO THE COVID-19 PANDEMIC.

## 2019-06-19 ENCOUNTER — Telehealth: Payer: Self-pay | Admitting: Pharmacy Technician

## 2019-06-19 NOTE — Telephone Encounter (Signed)
Pharmacy was able to reprocess 05/24/19 Prolia claim with new grant. Patient will receive a refund of the copay she paid. Left patient a voicemail advising.  10:54 AM [name], CPhT

## 2019-06-19 NOTE — Telephone Encounter (Signed)
Patient has been Approved for [employer] Post Menopausal Osteoporosis - Medicare Access for Copay assistance.  Date Portal Grant Approval Letter Generated: 06/19/2019 Patient's Name: Hannah [name] Identification Number: [id number] Fund: Post Menopausal Osteoporosis - Medicare Access Enrollment Period: 05/20/2019 through 05/18/2020 Hannah [name] Amount: $1,000.00  Pharmacy Card ID- [id number]  Group- [id number] PCN- [id number]  BIN- [id number] PCN- [id number]  10:26 AM Hannah [name], CPhT

## 2019-06-20 ENCOUNTER — Encounter: Payer: Self-pay | Admitting: Internal Medicine

## 2019-06-22 NOTE — Progress Notes (Signed)
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 The patient's last visit with [place] was on 06/20/2018. The pt reports that she is doing well overall.  The pt reports that she has added a new medication to try to control her HTN. She is still on the Plavenix and has continued receiving Prolia injections. Pt will see Dr. Estanislado Pandy again in January. She states that her RA is currently well controlled. Dr. Baird Cancer, her PCP, checked her TSH levels which were good and she has been taking Synthroid regularly. Pt has been taking PO iron twice a day. Pt has not had any bleeding concerns in the interim. She notes that she does not sleep well and has not for about 20 years. She gets about 6 hours of sleep per night. Pt notes that she has had IV Iron here twice and did not have issues with either.   Lab results (06/26/19) of CBC w/diff and CMP is as follows: all values are WNL except for RBC at 3.33, Hgb at 10.4, HCT at 32.6, Eosinophils Abs at 0.6K, Creatinine at 1.22, Calcium at 8.5, GFR Est AFR Am at 51. 06/26/2019 Ferritin at 26 06/26/2019 Iron and TIBC is as follows: Iron at 46, TIBC at 286, Sat Ratios at 16, UIBC at 240.   On review of systems, pt reports fatigue and denies bleeding concerns, fevers, chills, infection issues, abdominal pain and any other symptoms.    REVIEW OF SYSTEMS:    A 10+ POINT REVIEW OF SYSTEMS WAS OBTAINED including neurology, dermatology, psychiatry, cardiac,  respiratory, lymph, extremities, GI, GU, Musculoskeletal, constitutional, breasts, reproductive, HEENT.  All pertinent positives are noted in the HPI.  All others are negative.  . Past Medical History:  Diagnosis Date  . Arthritis   . Asthma   . Diabetes mellitus without complication ([place])   . Gout 12/17/2014   patient reported  . Hyperlipidemia   . Hypertension   possible SLE/rheumatoid arthritis following with Dr.Deveshwar.  . Past Surgical History:  Procedure Laterality Date  . CATARACT EXTRACTION Bilateral 2015  . DILATION AND CURETTAGE OF UTERUS    . DOPPLER ECHOCARDIOGRAPHY  05/2018   Internist to review with pt; potential heart murmur 06/20/18  . SKIN SURGERY  11/30/2018   left side of face    . Social History   Tobacco Use  . Smoking status: Never Smoker  . Smokeless tobacco: Never Used  Substance Use Topics  . Alcohol use: Yes    Comment: rarely   . Drug use: No    ALLERGIES:  is allergic to shellfish allergy.  MEDICATIONS:  Current Outpatient Medications  Medication Sig Dispense Refill  . albuterol (PROVENTIL HFA;VENTOLIN HFA) 108 (90 Base) MCG/ACT inhaler Inhale 2 puffs into the lungs every 6 (six) hours as needed for wheezing or shortness of breath. 1 Inhaler 2  . allopurinol (ZYLOPRIM) 100 MG tablet TAKE 1 TABLET BY MOUTH EVERY DAY FOR GOUT 90 tablet 1  . amLODipine (NORVASC) 5 MG tablet Take 1 tablet (  5 mg total) by mouth daily. 90 tablet 2  . aspirin EC 81 MG tablet Take 81 mg by mouth daily.    [name] atorvastatin (LIPITOR) 80 MG tablet TAKE 1 TABLET BY MOUTH EVERY DAY 90 tablet 1  . budesonide-formoterol (SYMBICORT) 160-4.5 MCG/ACT inhaler INHALE 2 PUFFS BY MOUTH TWICE DAILY IN THE MORNING AND EVENING 10.2 g 11  . Calcium Carb-Cholecalciferol (CALCIUM 600+D) 600-800 MG-UNIT TABS Take by mouth 2 (two) times daily.     . Cholecalciferol (VITAMIN D PO) Take 1,000 Units by mouth daily.     [name] denosumab (PROLIA) 60 MG/ML SOSY injection Inject 60 mg into the skin  every 6 (six) months. 1 mL 0  . hydroxychloroquine (PLAQUENIL) 200 MG tablet TAKE 1 TABLET BY MOUTH DAILY FOR RHEUMATOID ARTHRITIS 90 tablet 0  . Hypromellose (ARTIFICIAL TEARS OP) Apply to eye as needed.    . irbesartan (AVAPRO) 300 MG tablet Take 1 tablet (300 mg total) by mouth daily. 90 tablet 2  . iron polysaccharides (NIFEREX) 150 MG capsule Take 1 capsule (150 mg total) by mouth daily. 60 capsule 3  . metFORMIN (GLUCOPHAGE) 500 MG tablet TAKE 1 TABLET BY MOUTH TWICE DAILY WITH THE MORNING AND EVENING MEAL 180 tablet 0  . SYNTHROID 88 MCG tablet TAKE 1 TABLET BY MOUTH EVERY DAY MONDAY TO SATURDAY AND OFF SUNDAYS 90 tablet 0   No current facility-administered medications for this visit.     PHYSICAL EXAMINATION: ECOG PERFORMANCE STATUS: 1 - Symptomatic but completely ambulatory  There were no vitals filed for this visit.  There were no vitals filed for this visit. .There is no height or weight on file to calculate BMI.   GENERAL:alert, in no acute distress and comfortable SKIN: no acute rashes, no significant lesions EYES: conjunctiva are pink and non-injected, sclera anicteric OROPHARYNX: MMM, no exudates, no oropharyngeal erythema or ulceration NECK: supple, no JVD LYMPH:  no palpable lymphadenopathy in the cervical, axillary or inguinal regions LUNGS: clear to auscultation b/l with normal respiratory effort HEART: regular rate & rhythm ABDOMEN:  normoactive bowel sounds , non tender, not distended. No palpable hepatosplenomegaly.  Extremity: no pedal edema PSYCH: alert & oriented x 3 with fluent speech NEURO: no focal motor/sensory deficits   LABORATORY DATA:   I have reviewed the data as listed  . CBC Latest Ref Rng & Units 06/08/2019 05/16/2019 12/19/2018  WBC 3.8 - 10.8 Thousand/uL 4.3 5.2 4.7  Hemoglobin 11.7 - 15.5 g/dL 10.0(L) 10.8(L) 11.0(L)  Hematocrit 35.0 - 45.0 % 31.2(L) 32.1(L) 35.1(L)  Platelets 140 - 400 Thousand/uL 142 150 149(L)    . CMP Latest Ref  Rng & Units 06/08/2019 05/16/2019 02/23/2019  Glucose 65 - 99 mg/dL 114(H) 90 85  BUN 7 - 25 mg/dL 19 14 9   Creatinine 0.60 - 0.93 mg/dL 1.03(H) 1.19(H) 1.12(H)  Sodium 135 - 146 mmol/L 139 138 140  Potassium 3.5 - 5.3 mmol/L 4.9 4.2 4.4  Chloride 98 - 110 mmol/L 105 102 103  CO2 20 - 32 mmol/L 27 27 22   Calcium 8.6 - 10.4 mg/dL 9.2 9.9 8.7  Total Protein 6.1 - 8.1 g/dL 6.0(L) 6.7 6.6  Total Bilirubin 0.2 - 1.2 mg/dL 0.4 0.6 0.4  Alkaline Phos 39 - 117 IU/L - - 47  AST 10 - 35 U/L 18 19 24   ALT 6 - 29 U/L 14 15 19   .   Lab Results  Component Value Date   FERRITIN 24 12/19/2018   . Lab Results  Component Value  Date   IRON 71 12/19/2018   TIBC 321 12/19/2018   IRONPCTSAT 22 12/19/2018   (Iron and TIBC)  Lab Results  Component Value Date   FERRITIN 24 12/19/2018          RADIOGRAPHIC STUDIES: I have personally reviewed the radiological images as listed and agreed with the findings in the report. No results found. METASTATIC BONE SURVEY 0/02/2015  COMPARISON: None.  FINDINGS: Heart is normal size. Right diaphragmatic hernia again noted, unchanged. Lungs are clear. No effusions.  No focal lytic lesions within the visualized bony structures or acute bony abnormality. Degenerative changes within the shoulders, lower cervical spine, mid to lower thoracic spine, and lower lumbar spine. Advanced degenerative facet disease in the lower lumbar spine. 9 mm of anterolisthesis of L4 on L5. Endplate sclerosis within L4 and L5. Mild degenerative changes in the hips. Advanced degenerative changes within the knees. 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  IMPRESSION: No focal lytic lesion.  Degenerative joint disease involving multiple joints as described above.  Grade 2 anterolisthesis of L4 on L5 related to facet disease.   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 Bone survey X-ray showed no lytic lesions. M spike is 0.6 g/dL in 10/016 and then 0.5g/dl in 10/2015. SPEP in May/2017 and 07/2016 showed M proteinwas stable at 0.3g/dl  MGUS likely related to underlying connective tissue disorder.  Bone marrow biopsy does show about 13% plasma cells however these appear to be polyclonal and likely related to a possible underlying inflammatory disorder. Less likely a biclonal plasma cell dyscrasia.  #2 Normocytic normochromic anemia likely related to chronic inflammation from recent diagnosis of lupus/rheumatoid arthritis + CKD.  Increased acanthocytes on peripheral blood smear -no overt evidence of liver disease. Could be a marker of some MDS Bone marrow biopsy did not show overt plasma cell dyscrasia or myelodysplastic syndrome. 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 Bone marrow biopsy showed decreased iron stores. Ferritin and iron have been trending down, she has not noticed any issues with bruising or bleeding recently. ?absorption issue. I have given her the option of IV iron which she has previously had without issue. She would like to have this.   PLAN: -Discussed pt labwork, 06/26/19;  all values are WNL except for RBC at 3.33, Hgb at 10.4, HCT at 32.6, Eosinophils Abs at 0.6K, Creatinine at 1.22, Calcium at 8.5, GFR Est AFR Am at 51. -Discussed 06/26/2019 Ferritin at 26 -Discussed 06/26/2019 Iron and TIBC is as follows: Iron at 46, TIBC at 286, Sat Ratios at 16, UIBC at 240.  -Goal Ferritin >100 -Given that the pt has chronic inflammation with RA, optimizing her RA treatment will have a positive impact on her anemia.  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 Estanislado Pandy. Plan -Continue optimization of her autoimmune disorder treatment with  rheumatology Dr. Estanislado Pandy. -She has no symptoms or findings suggestive of multiple myeloma at this time. -will Follow-up on pending labs from today SPEP with [facility] -Continue follow-up with primary care physician and rheumatology.   FOLLOW UP: IV Injectafer weekly x 2 doses ASAP RTC with Dr Irene Limbo with labs in 6 months  The total time spent in the appt was 15  minutes and more than 50% was on counseling and direct patient cares.  All of the patient's questions were answered with apparent satisfaction. The patient knows to call the clinic with any problems, questions or concerns.  Sullivan Lone MD [place] AAHIVMS [facility] Hematology/Oncology Physician [facility]  (Office):       [phone] (Work cell):  [phone] (Fax):           [phone]  I, Yevette Edwards, am acting as a scribe for Dr. Sullivan Lone.   .I have reviewed the above documentation for accuracy and completeness, and I agree with the above. Brunetta Genera MD

## 2019-06-26 ENCOUNTER — Inpatient Hospital Stay: Payer: Medicare HMO | Attending: Hematology

## 2019-06-26 ENCOUNTER — Other Ambulatory Visit: Payer: Self-pay | Admitting: Rheumatology

## 2019-06-26 ENCOUNTER — Other Ambulatory Visit: Payer: Self-pay

## 2019-06-26 DIAGNOSIS — I10 Essential (primary) hypertension: Secondary | ICD-10-CM | POA: Diagnosis not present

## 2019-06-26 DIAGNOSIS — M329 Systemic lupus erythematosus, unspecified: Secondary | ICD-10-CM | POA: Diagnosis not present

## 2019-06-26 DIAGNOSIS — K449 Diaphragmatic hernia without obstruction or gangrene: Secondary | ICD-10-CM | POA: Insufficient documentation

## 2019-06-26 DIAGNOSIS — Z79899 Other long term (current) drug therapy: Secondary | ICD-10-CM | POA: Diagnosis not present

## 2019-06-26 DIAGNOSIS — R5383 Other fatigue: Secondary | ICD-10-CM | POA: Insufficient documentation

## 2019-06-26 DIAGNOSIS — M069 Rheumatoid arthritis, unspecified: Secondary | ICD-10-CM

## 2019-06-26 DIAGNOSIS — M0579 Rheumatoid arthritis with rheumatoid factor of multiple sites without organ or systems involvement: Secondary | ICD-10-CM

## 2019-06-26 DIAGNOSIS — G9589 Other specified diseases of spinal cord: Secondary | ICD-10-CM | POA: Diagnosis not present

## 2019-06-26 DIAGNOSIS — D649 Anemia, unspecified: Secondary | ICD-10-CM | POA: Insufficient documentation

## 2019-06-26 DIAGNOSIS — D472 Monoclonal gammopathy: Secondary | ICD-10-CM | POA: Diagnosis not present

## 2019-06-26 DIAGNOSIS — M1990 Unspecified osteoarthritis, unspecified site: Secondary | ICD-10-CM | POA: Insufficient documentation

## 2019-06-26 DIAGNOSIS — N189 Chronic kidney disease, unspecified: Secondary | ICD-10-CM | POA: Diagnosis not present

## 2019-06-26 LAB — CMP (CANCER CENTER ONLY)
ALT: 16 U/L (ref 0–44)
AST: 22 U/L (ref 15–41)
Albumin: 3.9 g/dL (ref 3.5–5.0)
Alkaline Phosphatase: 48 U/L (ref 38–126)
Anion gap: 7 (ref 5–15)
BUN: 18 mg/dL (ref 8–23)
CO2: 27 mmol/L (ref 22–32)
Calcium: 8.5 mg/dL — ABNORMAL LOW (ref 8.9–10.3)
Chloride: 106 mmol/L (ref 98–111)
Creatinine: 1.22 mg/dL — ABNORMAL HIGH (ref 0.44–1.00)
GFR, Est AFR Am: 51 mL/min — ABNORMAL LOW (ref >60)
GFR, Estimated: 44 mL/min — ABNORMAL LOW (ref >60)
Glucose, Bld: 94 mg/dL (ref 70–99)
Potassium: 4.5 mmol/L (ref 3.5–5.1)
Sodium: 140 mmol/L (ref 135–145)
Total Bilirubin: 0.3 mg/dL (ref 0.3–1.2)
Total Protein: 6.6 g/dL (ref 6.5–8.1)

## 2019-06-26 LAB — CBC WITH DIFFERENTIAL/PLATELET
Abs Immature Granulocytes: 0.01 10*3/uL (ref 0.00–0.07)
Basophils Absolute: 0.1 10*3/uL (ref 0.0–0.1)
Basophils Relative: 1 %
Eosinophils Absolute: 0.6 10*3/uL — ABNORMAL HIGH (ref 0.0–0.5)
Eosinophils Relative: 12 %
HCT: 32.6 % — ABNORMAL LOW (ref 36.0–46.0)
Hemoglobin: 10.4 g/dL — ABNORMAL LOW (ref 12.0–15.0)
Immature Granulocytes: 0 %
Lymphocytes Relative: 22 %
Lymphs Abs: 1.2 10*3/uL (ref 0.7–4.0)
MCH: 31.2 pg (ref 26.0–34.0)
MCHC: 31.9 g/dL (ref 30.0–36.0)
MCV: 97.9 fL (ref 80.0–100.0)
Monocytes Absolute: 0.4 10*3/uL (ref 0.1–1.0)
Monocytes Relative: 8 %
Neutro Abs: 3.1 10*3/uL (ref 1.7–7.7)
Neutrophils Relative %: 57 %
Platelets: 171 10*3/uL (ref 150–400)
RBC: 3.33 MIL/uL — ABNORMAL LOW (ref 3.87–5.11)
RDW: 13.6 % (ref 11.5–15.5)
WBC: 5.4 10*3/uL (ref 4.0–10.5)
nRBC: 0 % (ref 0.0–0.2)

## 2019-06-26 LAB — FERRITIN: Ferritin: 26 ng/mL (ref 11–307)

## 2019-06-26 LAB — IRON AND TIBC
Iron: 46 ug/dL (ref 41–142)
Saturation Ratios: 16 % — ABNORMAL LOW (ref 21–57)
TIBC: 286 ug/dL (ref 236–444)
UIBC: 240 ug/dL (ref 120–384)

## 2019-06-26 NOTE — Telephone Encounter (Signed)
Last Visit: 05/16/19 Next Visit: 10/17/19 Labs: 06/26/19 Creat. 1.22 GFR 51, Calcium 8.5 RBC 3.33, Hgb  10.4, Hct 32.6  Eye exam: 08/17/18 WNL  Okay to refill per Dr. Estanislado Pandy

## 2019-06-28 ENCOUNTER — Inpatient Hospital Stay (HOSPITAL_BASED_OUTPATIENT_CLINIC_OR_DEPARTMENT_OTHER): Payer: Medicare HMO | Admitting: Hematology

## 2019-06-28 ENCOUNTER — Other Ambulatory Visit: Payer: Medicare HMO

## 2019-06-28 ENCOUNTER — Other Ambulatory Visit: Payer: Self-pay

## 2019-06-28 ENCOUNTER — Telehealth: Payer: Self-pay | Admitting: Hematology

## 2019-06-28 VITALS — BP 151/69 | HR 85 | Temp 98.3°F | Resp 17 | Ht <= 58 in | Wt 160.3 lb

## 2019-06-28 DIAGNOSIS — M329 Systemic lupus erythematosus, unspecified: Secondary | ICD-10-CM | POA: Diagnosis not present

## 2019-06-28 DIAGNOSIS — D649 Anemia, unspecified: Secondary | ICD-10-CM

## 2019-06-28 DIAGNOSIS — M069 Rheumatoid arthritis, unspecified: Secondary | ICD-10-CM | POA: Diagnosis not present

## 2019-06-28 DIAGNOSIS — R5383 Other fatigue: Secondary | ICD-10-CM | POA: Diagnosis not present

## 2019-06-28 DIAGNOSIS — D472 Monoclonal gammopathy: Secondary | ICD-10-CM

## 2019-06-28 DIAGNOSIS — D508 Other iron deficiency anemias: Secondary | ICD-10-CM

## 2019-06-28 DIAGNOSIS — N189 Chronic kidney disease, unspecified: Secondary | ICD-10-CM | POA: Diagnosis not present

## 2019-06-28 DIAGNOSIS — M1990 Unspecified osteoarthritis, unspecified site: Secondary | ICD-10-CM | POA: Diagnosis not present

## 2019-06-28 DIAGNOSIS — K449 Diaphragmatic hernia without obstruction or gangrene: Secondary | ICD-10-CM | POA: Diagnosis not present

## 2019-06-28 DIAGNOSIS — I10 Essential (primary) hypertension: Secondary | ICD-10-CM | POA: Diagnosis not present

## 2019-06-28 NOTE — Telephone Encounter (Signed)
Scheduled per los. Gave avs and calendar  

## 2019-06-30 ENCOUNTER — Inpatient Hospital Stay: Payer: Medicare HMO | Attending: Hematology

## 2019-06-30 ENCOUNTER — Other Ambulatory Visit: Payer: Self-pay

## 2019-06-30 VITALS — BP 157/71 | HR 65 | Temp 98.5°F | Resp 20

## 2019-06-30 DIAGNOSIS — D508 Other iron deficiency anemias: Secondary | ICD-10-CM

## 2019-06-30 DIAGNOSIS — L949 Localized connective tissue disorder, unspecified: Secondary | ICD-10-CM | POA: Diagnosis not present

## 2019-06-30 DIAGNOSIS — Z79899 Other long term (current) drug therapy: Secondary | ICD-10-CM | POA: Insufficient documentation

## 2019-06-30 DIAGNOSIS — D472 Monoclonal gammopathy: Secondary | ICD-10-CM | POA: Insufficient documentation

## 2019-06-30 DIAGNOSIS — D638 Anemia in other chronic diseases classified elsewhere: Secondary | ICD-10-CM | POA: Insufficient documentation

## 2019-06-30 DIAGNOSIS — N189 Chronic kidney disease, unspecified: Secondary | ICD-10-CM | POA: Diagnosis not present

## 2019-06-30 DIAGNOSIS — M329 Systemic lupus erythematosus, unspecified: Secondary | ICD-10-CM | POA: Diagnosis not present

## 2019-06-30 MED ORDER — SODIUM CHLORIDE 0.9 % IV SOLN
Freq: Once | INTRAVENOUS | Status: AC
  Administered 2019-06-30: 14:00:00 via INTRAVENOUS
  Filled 2019-06-30: qty 250

## 2019-06-30 MED ORDER — ACETAMINOPHEN 325 MG PO TABS
650.0000 mg | ORAL_TABLET | Freq: Once | ORAL | Status: AC
  Administered 2019-06-30: 650 mg via ORAL

## 2019-06-30 MED ORDER — SODIUM CHLORIDE 0.9 % IV SOLN
750.0000 mg | Freq: Once | INTRAVENOUS | Status: AC
  Administered 2019-06-30: 750 mg via INTRAVENOUS
  Filled 2019-06-30: qty 15

## 2019-06-30 MED ORDER — ACETAMINOPHEN 325 MG PO TABS
ORAL_TABLET | ORAL | Status: AC
  Filled 2019-06-30: qty 2

## 2019-06-30 MED ORDER — LORATADINE 10 MG PO TABS
10.0000 mg | ORAL_TABLET | Freq: Once | ORAL | Status: AC
  Administered 2019-06-30: 10 mg via ORAL

## 2019-06-30 MED ORDER — LORATADINE 10 MG PO TABS
ORAL_TABLET | ORAL | Status: AC
  Filled 2019-06-30: qty 1

## 2019-06-30 NOTE — Patient Instructions (Signed)
Ferric carboxymaltose injection What is this medicine? FERRIC CARBOXYMALTOSE (ferr-ik car-box-ee-mol-toes) is an iron complex. Iron is used to make healthy red blood cells, which carry oxygen and nutrients throughout the body. This medicine is used to treat anemia in people with chronic kidney disease or people who cannot take iron by mouth. This medicine may be used for other purposes; ask your health care provider or pharmacist if you have questions. COMMON BRAND NAME(S): Injectafer What should I tell my health care provider before I take this medicine? They need to know if you have any of these conditions:  high levels of iron in the blood  liver disease  an unusual or allergic reaction to iron, other medicines, foods, dyes, or preservatives  pregnant or trying to get pregnant  breast-feeding How should I use this medicine? This medicine is for infusion into a vein. It is given by a health care professional in a hospital or clinic setting. Talk to your pediatrician regarding the use of this medicine in children. Special care may be needed. Overdosage: If you think you have taken too much of this medicine contact a poison control center or emergency room at once. NOTE: This medicine is only for you. Do not share this medicine with others. What if I miss a dose? It is important not to miss your dose. Call your doctor or health care professional if you are unable to keep an appointment. What may interact with this medicine? Do not take this medicine with any of the following medications:  deferoxamine  dimercaprol  other iron products This list may not describe all possible interactions. Give your health care provider a list of all the medicines, herbs, non-prescription drugs, or dietary supplements you use. Also tell them if you smoke, drink alcohol, or use illegal drugs. Some items may interact with your medicine. What should I watch for while using this medicine? Visit your  doctor or health care professional regularly. Tell your doctor if your symptoms do not start to get better or if they get worse. You may need blood work done while you are taking this medicine. You may need to follow a special diet. Talk to your doctor. Foods that contain iron include: whole grains/cereals, dried fruits, beans, or peas, leafy green vegetables, and organ meats (liver, kidney). What side effects may I notice from receiving this medicine? Side effects that you should report to your doctor or health care professional as soon as possible:  allergic reactions like skin rash, itching or hives, swelling of the face, lips, or tongue  dizziness  facial flushing Side effects that usually do not require medical attention (report to your doctor or health care professional if they continue or are bothersome):  changes in taste  constipation  headache  nausea, vomiting  pain, redness, or irritation at site where injected This list may not describe all possible side effects. Call your doctor for medical advice about side effects. You may report side effects to FDA at 1-800-FDA-1088. Where should I keep my medicine? This drug is given in a hospital or clinic and will not be stored at home. NOTE: This sheet is a summary. It may not cover all possible information. If you have questions about this medicine, talk to your doctor, pharmacist, or health care provider.  2020 Elsevier/Gold Standard (2016-10-29 09:40:29)  Coronavirus (COVID-19) Are you at risk?  Are you at risk for the Coronavirus (COVID-19)?  To be considered HIGH RISK for Coronavirus (COVID-19), you have to meet the following   criteria:  . Traveled to [place], [place], [place], [place] or [place]; or in the [place] to [place], [place], [place], or [place]; and have fever, cough, and shortness of breath within the last 2 weeks of travel OR . Been in close contact with a person diagnosed with COVID-19 within the  last 2 weeks and have fever, cough, and shortness of breath . IF YOU DO NOT MEET THESE CRITERIA, YOU ARE CONSIDERED LOW RISK FOR COVID-19.  What to do if you are HIGH RISK for COVID-19?  . If you are having a medical emergency, call 911. . Seek medical care right away. Before you go to a doctor's office, urgent care or emergency department, call ahead and tell them about your recent travel, contact with someone diagnosed with COVID-19, and your symptoms. You should receive instructions from your physician's office regarding next steps of care.  . When you arrive at healthcare provider, tell the healthcare staff immediately you have returned from visiting [place], [place], [place], [place] or [place]; or traveled in the [place] to [place], [place], [place], or [place]; in the last two weeks or you have been in close contact with a person diagnosed with COVID-19 in the last 2 weeks.   . Tell the health care staff about your symptoms: fever, cough and shortness of breath. . After you have been seen by a medical provider, you will be either: o Tested for (COVID-19) and discharged home on quarantine except to seek medical care if symptoms worsen, and asked to  - Stay home and avoid contact with others until you get your results (4-5 days)  - Avoid travel on public transportation if possible (such as bus, train, or airplane) or o Sent to the Emergency Department by EMS for evaluation, COVID-19 testing, and possible admission depending on your condition and test results.  What to do if you are LOW RISK for COVID-19?  Reduce your risk of any infection by using the same precautions used for avoiding the common cold or flu:  . Wash your hands often with soap and warm water for at least 20 seconds.  If soap and water are not readily available, use an alcohol-based hand sanitizer with at least 60% alcohol.  . If coughing or sneezing, cover your mouth and nose by coughing or sneezing into the elbow  areas of your shirt or coat, into a tissue or into your sleeve (not your hands). . Avoid shaking hands with others and consider head nods or verbal greetings only. . Avoid touching your eyes, nose, or mouth with unwashed hands.  . Avoid close contact with people who are sick. . Avoid places or events with large numbers of people in one location, like concerts or sporting events. . Carefully consider travel plans you have or are making. . If you are planning any travel outside or inside the [place], visit the CDC's Travelers' Health webpage for the latest health notices. . If you have some symptoms but not all symptoms, continue to monitor at home and seek medical attention if your symptoms worsen. . If you are having a medical emergency, call 911.   ADDITIONAL HEALTHCARE OPTIONS FOR PATIENTS  [facility] / e-Visit: [email or web address]         [facility]: [phone]  [facility]: [phone]                   [facility]: [phone]   

## 2019-07-07 ENCOUNTER — Inpatient Hospital Stay: Payer: Medicare HMO

## 2019-07-07 ENCOUNTER — Other Ambulatory Visit: Payer: Self-pay

## 2019-07-07 VITALS — BP 142/70 | HR 66 | Resp 18

## 2019-07-07 DIAGNOSIS — M329 Systemic lupus erythematosus, unspecified: Secondary | ICD-10-CM | POA: Diagnosis not present

## 2019-07-07 DIAGNOSIS — D472 Monoclonal gammopathy: Secondary | ICD-10-CM | POA: Diagnosis not present

## 2019-07-07 DIAGNOSIS — Z79899 Other long term (current) drug therapy: Secondary | ICD-10-CM | POA: Diagnosis not present

## 2019-07-07 DIAGNOSIS — N189 Chronic kidney disease, unspecified: Secondary | ICD-10-CM | POA: Diagnosis not present

## 2019-07-07 DIAGNOSIS — D638 Anemia in other chronic diseases classified elsewhere: Secondary | ICD-10-CM | POA: Diagnosis not present

## 2019-07-07 DIAGNOSIS — L949 Localized connective tissue disorder, unspecified: Secondary | ICD-10-CM | POA: Diagnosis not present

## 2019-07-07 DIAGNOSIS — D508 Other iron deficiency anemias: Secondary | ICD-10-CM

## 2019-07-07 MED ORDER — ACETAMINOPHEN 325 MG PO TABS
650.0000 mg | ORAL_TABLET | Freq: Once | ORAL | Status: AC
  Administered 2019-07-07: 650 mg via ORAL

## 2019-07-07 MED ORDER — METHYLPREDNISOLONE SODIUM SUCC 125 MG IJ SOLR: 125.0000 mg | Freq: Once | INTRAMUSCULAR | Status: DC | PRN

## 2019-07-07 MED ORDER — ALBUTEROL SULFATE (2.5 MG/3ML) 0.083% IN NEBU
2.5000 mg | INHALATION_SOLUTION | Freq: Once | RESPIRATORY_TRACT | Status: DC | PRN
  Filled 2019-07-07: qty 3

## 2019-07-07 MED ORDER — ACETAMINOPHEN 325 MG PO TABS
ORAL_TABLET | ORAL | Status: AC
  Filled 2019-07-07: qty 2

## 2019-07-07 MED ORDER — FAMOTIDINE IN NACL 20-0.9 MG/50ML-% IV SOLN: 20.0000 mg | Freq: Once | INTRAVENOUS | Status: DC | PRN

## 2019-07-07 MED ORDER — DIPHENHYDRAMINE HCL 50 MG/ML IJ SOLN: 50.0000 mg | Freq: Once | INTRAMUSCULAR | Status: DC | PRN

## 2019-07-07 MED ORDER — SODIUM CHLORIDE 0.9 % IV SOLN
Freq: Once | INTRAVENOUS | Status: AC
  Administered 2019-07-07: 14:00:00 via INTRAVENOUS
  Filled 2019-07-07: qty 250

## 2019-07-07 MED ORDER — SODIUM CHLORIDE 0.9 % IV SOLN
750.0000 mg | Freq: Once | INTRAVENOUS | Status: AC
  Administered 2019-07-07: 750 mg via INTRAVENOUS
  Filled 2019-07-07: qty 15

## 2019-07-07 MED ORDER — SODIUM CHLORIDE 0.9 % IV SOLN
Freq: Once | INTRAVENOUS | Status: DC | PRN
  Filled 2019-07-07: qty 250

## 2019-07-07 MED ORDER — EPINEPHRINE 0.3 MG/0.3ML IJ SOAJ: 0.3000 mg | Freq: Once | INTRAMUSCULAR | Status: DC | PRN

## 2019-07-07 MED ORDER — LORATADINE 10 MG PO TABS
10.0000 mg | ORAL_TABLET | Freq: Once | ORAL | Status: AC
  Administered 2019-07-07: 10 mg via ORAL

## 2019-07-07 NOTE — Patient Instructions (Signed)
Ferric carboxymaltose injection What is this medicine? FERRIC CARBOXYMALTOSE (ferr-ik car-box-ee-mol-toes) is an iron complex. Iron is used to make healthy red blood cells, which carry oxygen and nutrients throughout the body. This medicine is used to treat anemia in people with chronic kidney disease or people who cannot take iron by mouth. This medicine may be used for other purposes; ask your health care provider or pharmacist if you have questions. COMMON BRAND NAME(S): Injectafer What should I tell my health care provider before I take this medicine? They need to know if you have any of these conditions:  high levels of iron in the blood  liver disease  an unusual or allergic reaction to iron, other medicines, foods, dyes, or preservatives  pregnant or trying to get pregnant  breast-feeding How should I use this medicine? This medicine is for infusion into a vein. It is given by a health care professional in a hospital or clinic setting. Talk to your pediatrician regarding the use of this medicine in children. Special care may be needed. Overdosage: If you think you have taken too much of this medicine contact a poison control center or emergency room at once. NOTE: This medicine is only for you. Do not share this medicine with others. What if I miss a dose? It is important not to miss your dose. Call your doctor or health care professional if you are unable to keep an appointment. What may interact with this medicine? Do not take this medicine with any of the following medications:  deferoxamine  dimercaprol  other iron products This list may not describe all possible interactions. Give your health care provider a list of all the medicines, herbs, non-prescription drugs, or dietary supplements you use. Also tell them if you smoke, drink alcohol, or use illegal drugs. Some items may interact with your medicine. What should I watch for while using this medicine? Visit your  doctor or health care professional regularly. Tell your doctor if your symptoms do not start to get better or if they get worse. You may need blood work done while you are taking this medicine. You may need to follow a special diet. Talk to your doctor. Foods that contain iron include: whole grains/cereals, dried fruits, beans, or peas, leafy green vegetables, and organ meats (liver, kidney). What side effects may I notice from receiving this medicine? Side effects that you should report to your doctor or health care professional as soon as possible:  allergic reactions like skin rash, itching or hives, swelling of the face, lips, or tongue  dizziness  facial flushing Side effects that usually do not require medical attention (report to your doctor or health care professional if they continue or are bothersome):  changes in taste  constipation  headache  nausea, vomiting  pain, redness, or irritation at site where injected This list may not describe all possible side effects. Call your doctor for medical advice about side effects. You may report side effects to FDA at 1-800-FDA-1088. Where should I keep my medicine? This drug is given in a hospital or clinic and will not be stored at home. NOTE: This sheet is a summary. It may not cover all possible information. If you have questions about this medicine, talk to your doctor, pharmacist, or health care provider.  2020 Elsevier/Gold Standard (2016-10-29 09:40:29)  Coronavirus (COVID-19) Are you at risk?  Are you at risk for the Coronavirus (COVID-19)?  To be considered HIGH RISK for Coronavirus (COVID-19), you have to meet the following   criteria:  . Traveled to [place], [place], [place], [place] or [place]; or in the [place] to [place], [place], [place], or [place]; and have fever, cough, and shortness of breath within the last 2 weeks of travel OR . Been in close contact with a person diagnosed with COVID-19 within the  last 2 weeks and have fever, cough, and shortness of breath . IF YOU DO NOT MEET THESE CRITERIA, YOU ARE CONSIDERED LOW RISK FOR COVID-19.  What to do if you are HIGH RISK for COVID-19?  . If you are having a medical emergency, call 911. . Seek medical care right away. Before you go to a doctor's office, urgent care or emergency department, call ahead and tell them about your recent travel, contact with someone diagnosed with COVID-19, and your symptoms. You should receive instructions from your physician's office regarding next steps of care.  . When you arrive at healthcare provider, tell the healthcare staff immediately you have returned from visiting [place], [place], [place], [place] or [place]; or traveled in the [place] to [place], [place], [place], or [place]; in the last two weeks or you have been in close contact with a person diagnosed with COVID-19 in the last 2 weeks.   . Tell the health care staff about your symptoms: fever, cough and shortness of breath. . After you have been seen by a medical provider, you will be either: o Tested for (COVID-19) and discharged home on quarantine except to seek medical care if symptoms worsen, and asked to  - Stay home and avoid contact with others until you get your results (4-5 days)  - Avoid travel on public transportation if possible (such as bus, train, or airplane) or o Sent to the Emergency Department by EMS for evaluation, COVID-19 testing, and possible admission depending on your condition and test results.  What to do if you are LOW RISK for COVID-19?  Reduce your risk of any infection by using the same precautions used for avoiding the common cold or flu:  . Wash your hands often with soap and warm water for at least 20 seconds.  If soap and water are not readily available, use an alcohol-based hand sanitizer with at least 60% alcohol.  . If coughing or sneezing, cover your mouth and nose by coughing or sneezing into the elbow  areas of your shirt or coat, into a tissue or into your sleeve (not your hands). . Avoid shaking hands with others and consider head nods or verbal greetings only. . Avoid touching your eyes, nose, or mouth with unwashed hands.  . Avoid close contact with people who are sick. . Avoid places or events with large numbers of people in one location, like concerts or sporting events. . Carefully consider travel plans you have or are making. . If you are planning any travel outside or inside the [place], visit the CDC's Travelers' Health webpage for the latest health notices. . If you have some symptoms but not all symptoms, continue to monitor at home and seek medical attention if your symptoms worsen. . If you are having a medical emergency, call 911.   ADDITIONAL HEALTHCARE OPTIONS FOR PATIENTS  [facility] / e-Visit: [email or web address]         [facility]: [phone]  [facility]: [phone]                   [facility]: [phone]   

## 2019-07-14 ENCOUNTER — Telehealth: Payer: Self-pay

## 2019-07-17 ENCOUNTER — Other Ambulatory Visit: Payer: Self-pay

## 2019-07-17 MED ORDER — IRBESARTAN 300 MG PO TABS: 300.0000 mg | ORAL_TABLET | Freq: Every day | ORAL | 2 refills | Status: DC

## 2019-07-19 ENCOUNTER — Ambulatory Visit (INDEPENDENT_AMBULATORY_CARE_PROVIDER_SITE_OTHER): Payer: Medicare HMO | Admitting: Internal Medicine

## 2019-07-19 ENCOUNTER — Other Ambulatory Visit: Payer: Self-pay

## 2019-07-19 ENCOUNTER — Encounter: Payer: Self-pay | Admitting: Internal Medicine

## 2019-07-19 VITALS — BP 148/90 | HR 82 | Temp 98.2°F | Ht <= 58 in | Wt 159.4 lb

## 2019-07-19 DIAGNOSIS — E1122 Type 2 diabetes mellitus with diabetic chronic kidney disease: Secondary | ICD-10-CM

## 2019-07-19 DIAGNOSIS — L820 Inflamed seborrheic keratosis: Secondary | ICD-10-CM | POA: Diagnosis not present

## 2019-07-19 DIAGNOSIS — M549 Dorsalgia, unspecified: Secondary | ICD-10-CM | POA: Diagnosis not present

## 2019-07-19 DIAGNOSIS — I129 Hypertensive chronic kidney disease with stage 1 through stage 4 chronic kidney disease, or unspecified chronic kidney disease: Secondary | ICD-10-CM | POA: Diagnosis not present

## 2019-07-19 DIAGNOSIS — N183 Chronic kidney disease, stage 3 unspecified: Secondary | ICD-10-CM | POA: Diagnosis not present

## 2019-07-19 MED ORDER — IRBESARTAN 300 MG PO TABS: 300.0000 mg | ORAL_TABLET | Freq: Every day | ORAL | 2 refills | Status: DC

## 2019-07-19 NOTE — Progress Notes (Addendum)
Subjective:     Patient ID: Hannah [name] , female    DOB: Jan 16, 1946 , 73 y.o.   MRN: [id number]   Chief Complaint  Patient presents with  . Hypertension  . dermatology referral    HPI  She is here today for a bp check. The dose of amlodipine was increased at her last visit. She is now taking 5mg  daily. She has not experienced any side effects since starting the higher dose.    Additionally, [facility] She has areas on her face/neck that she wants to have removed.     Past Medical History:  Diagnosis Date  . Arthritis   . Asthma   . Diabetes mellitus without complication ([facility])   . Gout 12/17/2014   patient reported  . Hyperlipidemia   . Hypertension      Family History  Problem Relation Age of Onset  . Heart disease Father   . Diabetes Father   . Hypertension Mother   . Hypertension Sister   . Hypertension Sister   . Leukemia Sister      Current Outpatient Medications:  .  albuterol (PROVENTIL HFA;VENTOLIN HFA) 108 (90 Base) MCG/ACT inhaler, Inhale 2 puffs into the lungs every 6 (six) hours as needed for wheezing or shortness of breath., Disp: 1 Inhaler, Rfl: 2 .  allopurinol (ZYLOPRIM) 100 MG tablet, TAKE 1 TABLET BY MOUTH EVERY DAY FOR GOUT, Disp: 90 tablet, Rfl: 1 .  amLODipine (NORVASC) 5 MG tablet, Take 1 tablet (5 mg total) by mouth daily., Disp: 90 tablet, Rfl: 2 .  aspirin EC 81 MG tablet, Take 81 mg by mouth daily., Disp: , Rfl:  .  atorvastatin (LIPITOR) 80 MG tablet, TAKE 1 TABLET BY MOUTH EVERY DAY, Disp: 90 tablet, Rfl: 1 .  budesonide-formoterol (SYMBICORT) 160-4.5 MCG/ACT inhaler, INHALE 2 PUFFS BY MOUTH TWICE DAILY IN THE MORNING AND EVENING, Disp: 10.2 g, Rfl: 11 .  Calcium Carb-Cholecalciferol (CALCIUM 600+D) 600-800 MG-UNIT TABS, Take by mouth 2 (two) times daily. , Disp: , Rfl:  .  Cholecalciferol (VITAMIN D PO), Take 1,000 Units by mouth daily. , Disp: , Rfl:  .  denosumab (PROLIA) 60 MG/ML SOSY injection, Inject 60 mg  into the skin every 6 (six) months., Disp: 1 mL, Rfl: 0 .  hydroxychloroquine (PLAQUENIL) 200 MG tablet, TAKE 1 TABLET BY MOUTH DAILY FOR RHEUMATOID ARTHRITIS, Disp: 90 tablet, Rfl: 0 .  Hypromellose (ARTIFICIAL TEARS OP), Apply to eye as needed., Disp: , Rfl:  .  metFORMIN (GLUCOPHAGE) 500 MG tablet, TAKE 1 TABLET BY MOUTH TWICE DAILY WITH THE MORNING AND EVENING MEAL, Disp: 180 tablet, Rfl: 0 .  SYNTHROID 88 MCG tablet, TAKE 1 TABLET BY MOUTH EVERY DAY MONDAY TO SATURDAY AND OFF SUNDAYS, Disp: 90 tablet, Rfl: 0 .  irbesartan (AVAPRO) 300 MG tablet, Take 1 tablet (300 mg total) by mouth daily., Disp: 90 tablet, Rfl: 2   Allergies  Allergen Reactions  . Shellfish Allergy Anaphylaxis and Hives     Review of Systems  Constitutional: Negative.   Respiratory: Negative.   Cardiovascular: Negative.   Gastrointestinal: Negative.   Musculoskeletal: Positive for back pain.       She c/o intermittent mid-back pain. Described as dull ache. Denies recent falls/trauma. Sx appear to be exacerbated while seated at computer for long periods of time.   Skin:       She adds that she has some sore sports on her face/neck that she wants to have removed. She is  established with [place] and wants to return there.   Neurological: Negative.   Psychiatric/Behavioral: Negative.      Today's Vitals   07/19/19 1203  BP: (!) 148/90  Pulse: 82  Temp: 98.2 F (36.8 C)  TempSrc: Oral  Weight: 159 lb 6.4 oz (72.3 kg)  Height: 4' 9.2" (1.453 m)  PainSc: 0-No pain  PainLoc: Back   Body mass index is 34.25 kg/m.   Objective:  Physical Exam Vitals signs and nursing note reviewed.  Constitutional:      Appearance: Normal appearance.  HENT:     Head: Normocephalic and atraumatic.  Cardiovascular:     Rate and Rhythm: Normal rate and regular rhythm.     Heart sounds: Normal heart sounds.  Pulmonary:     Effort: Pulmonary effort is normal.     Breath sounds: Normal breath sounds.  Musculoskeletal:      Comments: Paraspinal tenderness to deep palpation  Skin:    General: Skin is warm.     Comments: Scattered hyperpigmented, stuck on plaques on face/neck. There is overlying dry skin. There are some with surrounding erythema.   Neurological:     General: No focal deficit present.     Mental Status: She is alert.  Psychiatric:        Mood and Affect: Mood normal.        Behavior: Behavior normal.         Assessment And Plan:     1. Hypertensive nephropathy  Chronic, not yet improved. She will continue with current meds for now. Encouraged to avoid garlic salt, seasoning salt, etc when cooking. She agrees to [facility] referral as well.   - Referral to Chronic Care Management Services  2. Stage 3 chronic kidney disease, unspecified whether stage 3a or 3b CKD  Chronic. She is encouraged to stay well hydrated.   - Referral to Chronic Care Management Services  3. Inflamed seborrheic keratosis  I will refer her to Derm as requested.   - Ambulatory referral to Dermatology  4. Mid back pain  She was given stretches to perform, especially while at computer. I will also refer her to [facility] for further evaluation and treatment.   - Ambulatory referral to Chiropractic  5. Diabetes mellitus with stage 3 chronic kidney disease (HCC)  Chronic. She is reminded to aim for 150 minutes of exercise per week.  She is interested in CCM services.  Referral placed as requested.   - Referral to Chronic Care Management Services   [name], MD    THE PATIENT IS ENCOURAGED TO PRACTICE SOCIAL DISTANCING DUE TO THE COVID-19 PANDEMIC.

## 2019-07-24 ENCOUNTER — Ambulatory Visit: Payer: Self-pay

## 2019-07-24 DIAGNOSIS — I129 Hypertensive chronic kidney disease with stage 1 through stage 4 chronic kidney disease, or unspecified chronic kidney disease: Secondary | ICD-10-CM

## 2019-07-24 DIAGNOSIS — N183 Chronic kidney disease, stage 3 unspecified: Secondary | ICD-10-CM

## 2019-07-24 DIAGNOSIS — E1122 Type 2 diabetes mellitus with diabetic chronic kidney disease: Secondary | ICD-10-CM

## 2019-07-24 NOTE — Chronic Care Management (AMB) (Signed)
Chronic Care Management   Social Work General Note  07/24/2019 Name: Hannah [name] MRN: [id number] DOB: 08-31-1946  Hannah [name] is a 73 y.o. year old female who is a primary care patient of Hannah Chard, MD. The CCM was consulted to assist the patient with care coordination and disease management.  Hannah [name] was given information about Chronic Care Management services today including:  1. CCM service includes personalized support from designated clinical staff supervised by her physician, including individualized plan of care and coordination with other care providers 2. 24/7 contact phone numbers for assistance for urgent and routine care needs. 3. Service will only be billed when office clinical staff spend 20 minutes or more in a month to coordinate care. 4. Only one practitioner may furnish and bill the service in a calendar month. 5. The patient may stop CCM services at any time (effective at the end of the month) by phone call to the office staff. 6. The patient will be responsible for cost sharing (co-pay) of up to 20% of the service fee (after annual deductible is met).  Patient agreed to services and verbal consent obtained.   Review of patient status, including review of consultants reports, relevant laboratory and other test results, and collaboration with appropriate care team members and the patient's provider was performed as part of comprehensive patient evaluation and provision of chronic care management services.    SW placed an outbound call to the patient in response to recent referral received from primary physician. Of note the patient previously enrolled in CCM services via health plan referral and has upcomming appointment with RN Case Manager on 08/17/2019. Patient  HIPAA identifiers confirmed.   SDOH (Social Determinants of Health) screening performed today. See Care Plan Entry related to challenges with: None  Advanced Directives Status: Patient  confirms possession of advance directive with no desire to make changes at this time.  The patient shares she has experienced past challenges with cost of asthma medications (Ventolin and Symbicort). At this time, the patient states her Asthma is well controlled without use of inhalers. SW advised the patient that if there is a change in her condition to contact the CM team regarding patient assistance.  The patient identifies current concern surrounding the cost of Synthroid. The patient reports receiving information during her most recent office visit regarding mail order pharmacy. The patient is hesitant to enroll in this program due to fear of receiving medications late. SW advised the patient embedded PharmD can review cost savings options to assist the patient in making an informed decision.  Outpatient Encounter Medications as of 07/24/2019  Medication Sig Note  . albuterol (PROVENTIL HFA;VENTOLIN HFA) 108 (90 Base) MCG/ACT inhaler Inhale 2 puffs into the lungs every 6 (six) hours as needed for wheezing or shortness of breath.   . allopurinol (ZYLOPRIM) 100 MG tablet TAKE 1 TABLET BY MOUTH EVERY DAY FOR GOUT   . amLODipine (NORVASC) 5 MG tablet Take 1 tablet (5 mg total) by mouth daily. 06/28/2019: Patient reports taking 2.5 mg in AM and 5 mg in PM per PCP directions  . aspirin EC 81 MG tablet Take 81 mg by mouth daily.   [name] atorvastatin (LIPITOR) 80 MG tablet TAKE 1 TABLET BY MOUTH EVERY DAY   . budesonide-formoterol (SYMBICORT) 160-4.5 MCG/ACT inhaler INHALE 2 PUFFS BY MOUTH TWICE DAILY IN THE MORNING AND EVENING   . Calcium Carb-Cholecalciferol (CALCIUM 600+D) 600-800 MG-UNIT TABS Take by mouth 2 (two) times daily.    [name]  Cholecalciferol (VITAMIN D PO) Take 1,000 Units by mouth daily.    [name] denosumab (PROLIA) 60 MG/ML SOSY injection Inject 60 mg into the skin every 6 (six) months.   . hydroxychloroquine (PLAQUENIL) 200 MG tablet TAKE 1 TABLET BY MOUTH DAILY FOR RHEUMATOID ARTHRITIS   .  Hypromellose (ARTIFICIAL TEARS OP) Apply to eye as needed.   . irbesartan (AVAPRO) 300 MG tablet Take 1 tablet (300 mg total) by mouth daily.   . metFORMIN (GLUCOPHAGE) 500 MG tablet TAKE 1 TABLET BY MOUTH TWICE DAILY WITH THE MORNING AND EVENING MEAL   . SYNTHROID 88 MCG tablet TAKE 1 TABLET BY MOUTH EVERY DAY MONDAY TO SATURDAY AND OFF SUNDAYS    No facility-administered encounter medications on file as of 07/24/2019.     Follow Up Plan: No further SW follow up planned at this time. The patient has been provided SW contact information and has been encouraged to call with any future resource needs. SW has communicated patient enrollment to the CM team whom will outreach the patient over the next 28 days.       Daneen Schick, BSW, CDP Social Worker, Certified Dementia Practitioner [facility] [phone]

## 2019-07-27 ENCOUNTER — Telehealth: Payer: Self-pay

## 2019-07-31 ENCOUNTER — Telehealth: Payer: Self-pay

## 2019-07-31 NOTE — Telephone Encounter (Signed)
The pt said that she is bringing a prescription by the office that she got from Dr Rogers Blocker at [facility] and said that Dr. Lynnda Shields said for the pt to see if Dr. Baird Cancer would do a referral for an x-ray of her back because if Dr. Lynnda Shields does the referral that the pt's insurance will not pay for the referral.  The pt said that she was referred to Dr. Lynnda Shields by Dr. Baird Cancer.

## 2019-08-01 ENCOUNTER — Ambulatory Visit: Payer: Self-pay | Admitting: Pharmacist

## 2019-08-01 DIAGNOSIS — N183 Chronic kidney disease, stage 3 unspecified: Secondary | ICD-10-CM

## 2019-08-01 DIAGNOSIS — I129 Hypertensive chronic kidney disease with stage 1 through stage 4 chronic kidney disease, or unspecified chronic kidney disease: Secondary | ICD-10-CM

## 2019-08-01 NOTE — Progress Notes (Signed)
Chronic Care Management   Initial Visit Note  08/01/2019 Name: Hannah [name] MRN: [id number] DOB: 03/05/1946  Referred by: Glendale Chard, MD Reason for referral : Chronic Care Management   Hannah [name] is a 73 y.o. year old female who is a primary care patient of Glendale Chard, MD. The CCM team was consulted for assistance with chronic disease management and care coordination needs related to CKD Stage 3  Review of patient status, including review of consultants reports, relevant laboratory and other test results, and collaboration with appropriate care team members and the patient's provider was performed as part of comprehensive patient evaluation and provision of chronic care management services.    I spoke with Ms. Passey by telephone today.  Medications: Outpatient Encounter Medications as of 08/01/2019  Medication Sig Note  . albuterol (PROVENTIL HFA;VENTOLIN HFA) 108 (90 Base) MCG/ACT inhaler Inhale 2 puffs into the lungs every 6 (six) hours as needed for wheezing or shortness of breath.   . allopurinol (ZYLOPRIM) 100 MG tablet TAKE 1 TABLET BY MOUTH EVERY DAY FOR GOUT   . amLODipine (NORVASC) 5 MG tablet Take 1 tablet (5 mg total) by mouth daily. 06/28/2019: Patient reports taking 2.5 mg in AM and 5 mg in PM per PCP directions  . aspirin EC 81 MG tablet Take 81 mg by mouth daily.   [name] atorvastatin (LIPITOR) 80 MG tablet TAKE 1 TABLET BY MOUTH EVERY DAY   . budesonide-formoterol (SYMBICORT) 160-4.5 MCG/ACT inhaler INHALE 2 PUFFS BY MOUTH TWICE DAILY IN THE MORNING AND EVENING   . Calcium Carb-Cholecalciferol (CALCIUM 600+D) 600-800 MG-UNIT TABS Take by mouth 2 (two) times daily.    . Cholecalciferol (VITAMIN D PO) Take 1,000 Units by mouth daily.    [name] denosumab (PROLIA) 60 MG/ML SOSY injection Inject 60 mg into the skin every 6 (six) months.   . hydroxychloroquine (PLAQUENIL) 200 MG tablet TAKE 1 TABLET BY MOUTH DAILY FOR RHEUMATOID ARTHRITIS   . Hypromellose  (ARTIFICIAL TEARS OP) Apply to eye as needed.   . irbesartan (AVAPRO) 300 MG tablet Take 1 tablet (300 mg total) by mouth daily.   . metFORMIN (GLUCOPHAGE) 500 MG tablet TAKE 1 TABLET BY MOUTH TWICE DAILY WITH THE MORNING AND EVENING MEAL   . SYNTHROID 88 MCG tablet TAKE 1 TABLET BY MOUTH EVERY DAY MONDAY TO SATURDAY AND OFF SUNDAYS    No facility-administered encounter medications on file as of 08/01/2019.      Objective:   Goals Addressed            This Visit's Progress     Patient Stated   . I would like to manage my chronic conditions (pt-stated)       Current Barriers:  [name] Knowledge Deficits related to disease state management  Pharmacist Clinical Goal(s):  [name] Over the next 90 days, patient will work with CCM team and PCP to address needs related to chronic conditions   Interventions: . Comprehensive medication review performed. . Discussed plans with patient for ongoing care management follow up and provided patient with direct contact information for care management team Asthma . Reviewed asthma medications, however patient states that she does not have to take her inhalers regularly per pulmonologist, Dr. Melvyn Novas.  This was discussed about 6 years ago. . Counseled on the use of rescue albuterol and symbicort (new guideline recommendations).  Patient carries inhalers with her when she is exercising or experiencing an illness (respiratory).  She states she rarely has to use her inhalers.  She denies needing a refill.  She is also managing her symptom control with antihistamines as needed. Hypothyroid . Patient is currently managing thyroid with brand name synthroid.  This was started by her previous doctor that has since retired.  Patient is paying $44 /month for brand name synthroid.  Will discuss with PCP.  Patient should be able to get brand name Synthroid at $25/month through Synthroid delivers program. . T4/TSH within normal limits   Initial goal documentation         Plan:   The care management team will reach out to the patient again over the next 30 days.   Provider Signature Regina Eck, PharmD, BCPS Clinical Pharmacist, [facility] [place]: [phone]

## 2019-08-06 ENCOUNTER — Other Ambulatory Visit: Payer: Self-pay | Admitting: Internal Medicine

## 2019-08-06 DIAGNOSIS — M545 Low back pain, unspecified: Secondary | ICD-10-CM

## 2019-08-06 DIAGNOSIS — M546 Pain in thoracic spine: Secondary | ICD-10-CM

## 2019-08-07 ENCOUNTER — Telehealth: Payer: Self-pay

## 2019-08-07 ENCOUNTER — Ambulatory Visit: Payer: Self-pay | Admitting: Pharmacist

## 2019-08-07 DIAGNOSIS — E039 Hypothyroidism, unspecified: Secondary | ICD-10-CM

## 2019-08-07 DIAGNOSIS — N183 Chronic kidney disease, stage 3 unspecified: Secondary | ICD-10-CM

## 2019-08-07 NOTE — Telephone Encounter (Signed)
The pt was notified that Dr. Baird Cancer placed an order for the pt to have a lumbar x-ray and that [facility] imaging should be contacting the pt and if she hasn't heard anything by Tuesday to call the office back.

## 2019-08-07 NOTE — Patient Instructions (Signed)
Visit Information  Goals Addressed            This Visit's Progress     Patient Stated   . I would like to manage my chronic conditions (pt-stated)       Current Barriers:  [name] Knowledge Deficits related to disease state management  Pharmacist Clinical Goal(s):  [name] Over the next 90 days, patient will work with CCM team and PCP to address needs related to chronic conditions   Interventions: . Comprehensive medication review performed. . Discussed plans with patient for ongoing care management follow up and provided patient with direct contact information for care management team Asthma . Reviewed asthma medications, however patient states that she does not have to take her inhalers regularly per pulmonologist, Dr. Melvyn Novas.  This was discussed about 6 years ago. . Counseled on the use of rescue albuterol and symbicort (new guideline recommendations).  Patient carries inhalers with her when she is exercising or experiencing an illness (respiratory).  She states she rarely has to use her inhalers.  She denies needing a refill.  She is also managing her symptom control with antihistamines as needed. Hypothyroid . Patient is currently managing thyroid with brand name synthroid.  This was started by her previous doctor that has since retired.  Patient is paying $44 /month for brand name synthroid.  Will discuss with PCP.  Patient should be able to get brand name Synthroid at $25/month through Synthroid delivers program. . T4/TSH within normal limits   Initial goal documentation        The patient verbalized understanding of instructions provided today and declined a print copy of patient instruction materials.   The care management team will reach out to the patient again over the next 30 days.   SIGNATURE Regina Eck, PharmD, BCPS Clinical Pharmacist, [facility] [place]: [phone]

## 2019-08-09 ENCOUNTER — Ambulatory Visit
Admission: RE | Admit: 2019-08-09 | Discharge: 2019-08-09 | Disposition: A | Discharge status: Home / Self Care | Payer: Medicare HMO | Source: Ambulatory Visit | Attending: Internal Medicine | Admitting: Internal Medicine

## 2019-08-09 DIAGNOSIS — M5134 Other intervertebral disc degeneration, thoracic region: Secondary | ICD-10-CM | POA: Diagnosis not present

## 2019-08-09 DIAGNOSIS — M546 Pain in thoracic spine: Secondary | ICD-10-CM

## 2019-08-09 DIAGNOSIS — M545 Low back pain, unspecified: Secondary | ICD-10-CM

## 2019-08-10 NOTE — Patient Instructions (Signed)
Visit Information  Goals Addressed            This Visit's Progress     Patient Stated   . I would like to manage my chronic conditions (pt-stated)       Current Barriers:  [name] Knowledge Deficits related to disease state management  Pharmacist Clinical Goal(s):  [name] Over the next 90 days, patient will work with CCM team and PCP to address needs related to chronic conditions   Interventions: . Comprehensive medication review performed. . Discussed plans with patient for ongoing care management follow up and provided patient with direct contact information for care management team Asthma . Reviewed asthma medications, however patient states that she does not have to take her inhalers regularly per pulmonologist, Dr. Melvyn Novas.  This was discussed about 6 years ago. . Counseled on the use of rescue albuterol and symbicort (new guideline recommendations).  Patient carries inhalers with her when she is exercising or experiencing an illness (respiratory).  She states she rarely has to use her inhalers.  She denies needing a refill.  She is also managing her symptom control with antihistamines as needed. Hypothyroidism . Patient is currently managing thyroid with brand name Synthroid.  This was started by her previous doctor that has since retired.  Patient is paying $44 /month for brand name Synthroid.  Will discuss with PCP.  The goal will be to keep patient on brand name Synthroid based on narrow therapeutic index.  Patient should be able to get brand name Synthroid at $25/month through Synthroid delivers program.  Patient sates she will think about the program and we will touch base next week. . T4/TSH within normal limits   Initial goal documentation        The patient verbalized understanding of instructions provided today and declined a print copy of patient instruction materials.   The care management team will reach out to the patient again over the next 14 days.   SIGNATURE Regina Eck, PharmD, BCPS Clinical Pharmacist, [facility] [place]: [phone]

## 2019-08-10 NOTE — Progress Notes (Signed)
Chronic Care Management   Initial Visit Note  08/07/2019 Name: Hannah [name] MRN: [id number] DOB: [birth year]  Referred by: Hannah Chard, MD Reason for referral : Chronic Care Management   Hannah [name] is a 73 y.o. year old female who is a primary care patient of Hannah Chard, MD. The CCM team was consulted for assistance with chronic disease management and care coordination needs related to Asthma & Hypothyroidism  Review of patient status, including review of consultants reports, relevant laboratory and other test results, and collaboration with appropriate care team members and the patient's provider was performed as part of comprehensive patient evaluation and provision of chronic care management services.    I spoke with Hannah [name] by telephone today.  Medications: Outpatient Encounter Medications as of 08/07/2019  Medication Sig Note  . albuterol (PROVENTIL HFA;VENTOLIN HFA) 108 (90 Base) MCG/ACT inhaler Inhale 2 puffs into the lungs every 6 (six) hours as needed for wheezing or shortness of breath.   . allopurinol (ZYLOPRIM) 100 MG tablet TAKE 1 TABLET BY MOUTH EVERY DAY FOR GOUT   . amLODipine (NORVASC) 5 MG tablet Take 1 tablet (5 mg total) by mouth daily. 06/28/2019: Patient reports taking 2.5 mg in AM and 5 mg in PM per PCP directions  . aspirin EC 81 MG tablet Take 81 mg by mouth daily.   [name] atorvastatin (LIPITOR) 80 MG tablet TAKE 1 TABLET BY MOUTH EVERY DAY   . budesonide-formoterol (SYMBICORT) 160-4.5 MCG/ACT inhaler INHALE 2 PUFFS BY MOUTH TWICE DAILY IN THE MORNING AND EVENING   . Calcium Carb-Cholecalciferol (CALCIUM 600+D) 600-800 MG-UNIT TABS Take by mouth 2 (two) times daily.    . Cholecalciferol (VITAMIN D PO) Take 1,000 Units by mouth daily.    [name] denosumab (PROLIA) 60 MG/ML SOSY injection Inject 60 mg into the skin every 6 (six) months.   . hydroxychloroquine (PLAQUENIL) 200 MG tablet TAKE 1 TABLET BY MOUTH DAILY FOR RHEUMATOID ARTHRITIS   .  Hypromellose (ARTIFICIAL TEARS OP) Apply to eye as needed.   . irbesartan (AVAPRO) 300 MG tablet Take 1 tablet (300 mg total) by mouth daily.   . metFORMIN (GLUCOPHAGE) 500 MG tablet TAKE 1 TABLET BY MOUTH TWICE DAILY WITH THE MORNING AND EVENING MEAL   . SYNTHROID 88 MCG tablet TAKE 1 TABLET BY MOUTH EVERY DAY MONDAY TO SATURDAY AND OFF SUNDAYS    No facility-administered encounter medications on file as of 08/07/2019.      Objective:   Goals Addressed            This Visit's Progress     Patient Stated   . I would like to manage my chronic conditions (pt-stated)       Current Barriers:  [name] Knowledge Deficits related to disease state management  Pharmacist Clinical Goal(s):  [name] Over the next 90 days, patient will work with CCM team and PCP to address needs related to chronic conditions   Interventions: . Comprehensive medication review performed. . Discussed plans with patient for ongoing care management follow up and provided patient with direct contact information for care management team Asthma . Reviewed asthma medications, however patient states that she does not have to take her inhalers regularly per pulmonologist, Dr. Melvyn [name].  This was discussed about 6 years ago. . Counseled on the use of rescue albuterol and symbicort (new guideline recommendations).  Patient carries inhalers with her when she is exercising or experiencing an illness (respiratory).  She states she rarely has to use her inhalers.  She denies needing a refill.  She is also managing her symptom control with antihistamines as needed. Hypothyroidism . Patient is currently managing thyroid with brand name Synthroid.  This was started by her previous doctor that has since retired.  Patient is paying $44 /month for brand name Synthroid.  Will discuss with PCP.  The goal will be to keep patient on brand name Synthroid based on narrow therapeutic index.  Patient should be able to get brand name Synthroid at $25/month  through Synthroid delivers program.  Patient sates she will think about the program and we will touch base next week. . T4/TSH within normal limits   Initial goal documentation         Plan:   The care management team will reach out to the patient again over the next 14 days.   Provider Signature Regina Eck, PharmD, BCPS Clinical Pharmacist, [facility] [place]: [phone]

## 2019-08-14 ENCOUNTER — Telehealth: Payer: Self-pay

## 2019-08-14 NOTE — Telephone Encounter (Signed)
1st attempt to give lab results  

## 2019-08-15 DIAGNOSIS — M9905 Segmental and somatic dysfunction of pelvic region: Secondary | ICD-10-CM | POA: Diagnosis not present

## 2019-08-15 DIAGNOSIS — M5137 Other intervertebral disc degeneration, lumbosacral region: Secondary | ICD-10-CM | POA: Diagnosis not present

## 2019-08-15 DIAGNOSIS — M9903 Segmental and somatic dysfunction of lumbar region: Secondary | ICD-10-CM | POA: Diagnosis not present

## 2019-08-15 DIAGNOSIS — M47814 Spondylosis without myelopathy or radiculopathy, thoracic region: Secondary | ICD-10-CM | POA: Diagnosis not present

## 2019-08-15 DIAGNOSIS — M5136 Other intervertebral disc degeneration, lumbar region: Secondary | ICD-10-CM | POA: Diagnosis not present

## 2019-08-15 DIAGNOSIS — M9902 Segmental and somatic dysfunction of thoracic region: Secondary | ICD-10-CM | POA: Diagnosis not present

## 2019-08-16 ENCOUNTER — Telehealth: Payer: Self-pay | Admitting: Pharmacist

## 2019-08-17 ENCOUNTER — Telehealth: Payer: Self-pay

## 2019-08-17 DIAGNOSIS — M9903 Segmental and somatic dysfunction of lumbar region: Secondary | ICD-10-CM | POA: Diagnosis not present

## 2019-08-17 DIAGNOSIS — M9902 Segmental and somatic dysfunction of thoracic region: Secondary | ICD-10-CM | POA: Diagnosis not present

## 2019-08-17 DIAGNOSIS — H1132 Conjunctival hemorrhage, left eye: Secondary | ICD-10-CM | POA: Diagnosis not present

## 2019-08-17 DIAGNOSIS — Z961 Presence of intraocular lens: Secondary | ICD-10-CM | POA: Diagnosis not present

## 2019-08-17 DIAGNOSIS — M5136 Other intervertebral disc degeneration, lumbar region: Secondary | ICD-10-CM | POA: Diagnosis not present

## 2019-08-17 DIAGNOSIS — M5137 Other intervertebral disc degeneration, lumbosacral region: Secondary | ICD-10-CM | POA: Diagnosis not present

## 2019-08-17 DIAGNOSIS — H04123 Dry eye syndrome of bilateral lacrimal glands: Secondary | ICD-10-CM | POA: Diagnosis not present

## 2019-08-17 DIAGNOSIS — M47814 Spondylosis without myelopathy or radiculopathy, thoracic region: Secondary | ICD-10-CM | POA: Diagnosis not present

## 2019-08-17 DIAGNOSIS — M329 Systemic lupus erythematosus, unspecified: Secondary | ICD-10-CM | POA: Diagnosis not present

## 2019-08-17 DIAGNOSIS — M9905 Segmental and somatic dysfunction of pelvic region: Secondary | ICD-10-CM | POA: Diagnosis not present

## 2019-08-17 DIAGNOSIS — Z79899 Other long term (current) drug therapy: Secondary | ICD-10-CM | POA: Diagnosis not present

## 2019-08-17 DIAGNOSIS — H26493 Other secondary cataract, bilateral: Secondary | ICD-10-CM | POA: Diagnosis not present

## 2019-08-17 DIAGNOSIS — E119 Type 2 diabetes mellitus without complications: Secondary | ICD-10-CM | POA: Diagnosis not present

## 2019-08-17 LAB — HM DIABETES EYE EXAM

## 2019-08-23 ENCOUNTER — Ambulatory Visit (INDEPENDENT_AMBULATORY_CARE_PROVIDER_SITE_OTHER): Payer: Medicare HMO | Admitting: Pharmacist

## 2019-08-23 DIAGNOSIS — M9902 Segmental and somatic dysfunction of thoracic region: Secondary | ICD-10-CM | POA: Diagnosis not present

## 2019-08-23 DIAGNOSIS — E1122 Type 2 diabetes mellitus with diabetic chronic kidney disease: Secondary | ICD-10-CM

## 2019-08-23 DIAGNOSIS — M5137 Other intervertebral disc degeneration, lumbosacral region: Secondary | ICD-10-CM | POA: Diagnosis not present

## 2019-08-23 DIAGNOSIS — E039 Hypothyroidism, unspecified: Secondary | ICD-10-CM | POA: Diagnosis not present

## 2019-08-23 DIAGNOSIS — M9905 Segmental and somatic dysfunction of pelvic region: Secondary | ICD-10-CM | POA: Diagnosis not present

## 2019-08-23 DIAGNOSIS — M47814 Spondylosis without myelopathy or radiculopathy, thoracic region: Secondary | ICD-10-CM | POA: Diagnosis not present

## 2019-08-23 DIAGNOSIS — M9903 Segmental and somatic dysfunction of lumbar region: Secondary | ICD-10-CM | POA: Diagnosis not present

## 2019-08-23 DIAGNOSIS — M5136 Other intervertebral disc degeneration, lumbar region: Secondary | ICD-10-CM | POA: Diagnosis not present

## 2019-08-23 DIAGNOSIS — N183 Chronic kidney disease, stage 3 unspecified: Secondary | ICD-10-CM | POA: Diagnosis not present

## 2019-08-23 NOTE — Progress Notes (Signed)
Chronic Care Management   Visit Note  08/23/2019 Name: Hannah [name] MRN: [id number] DOB: 07-Sep-1946  Referred by: Glendale Chard, MD Reason for referral : Chronic Care Management   Hannah [name] is a 73 y.o. year old female who is a primary care patient of Glendale Chard, MD. The CCM team was consulted for assistance with chronic disease management and care coordination needs related to hypothyroidism  Review of patient status, including review of consultants reports, relevant laboratory and other test results, and collaboration with appropriate care team members and the patient's provider was performed as part of comprehensive patient evaluation and provision of chronic care management services.     Medications: Outpatient Encounter Medications as of 08/23/2019  Medication Sig Note  . albuterol (PROVENTIL HFA;VENTOLIN HFA) 108 (90 Base) MCG/ACT inhaler Inhale 2 puffs into the lungs every 6 (six) hours as needed for wheezing or shortness of breath.   . allopurinol (ZYLOPRIM) 100 MG tablet TAKE 1 TABLET BY MOUTH EVERY DAY FOR GOUT   . amLODipine (NORVASC) 5 MG tablet Take 1 tablet (5 mg total) by mouth daily. 06/28/2019: Patient reports taking 2.5 mg in AM and 5 mg in PM per PCP directions  . aspirin EC 81 MG tablet Take 81 mg by mouth daily.   [name] atorvastatin (LIPITOR) 80 MG tablet TAKE 1 TABLET BY MOUTH EVERY DAY   . budesonide-formoterol (SYMBICORT) 160-4.5 MCG/ACT inhaler INHALE 2 PUFFS BY MOUTH TWICE DAILY IN THE MORNING AND EVENING   . Calcium Carb-Cholecalciferol (CALCIUM 600+D) 600-800 MG-UNIT TABS Take by mouth 2 (two) times daily.    . Cholecalciferol (VITAMIN D PO) Take 1,000 Units by mouth daily.    [name] denosumab (PROLIA) 60 MG/ML SOSY injection Inject 60 mg into the skin every 6 (six) months.   . hydroxychloroquine (PLAQUENIL) 200 MG tablet TAKE 1 TABLET BY MOUTH DAILY FOR RHEUMATOID ARTHRITIS   . Hypromellose (ARTIFICIAL TEARS OP) Apply to eye as needed.   .  irbesartan (AVAPRO) 300 MG tablet Take 1 tablet (300 mg total) by mouth daily.   . metFORMIN (GLUCOPHAGE) 500 MG tablet TAKE 1 TABLET BY MOUTH TWICE DAILY WITH THE MORNING AND EVENING MEAL   . SYNTHROID 88 MCG tablet TAKE 1 TABLET BY MOUTH EVERY DAY MONDAY TO SATURDAY AND OFF SUNDAYS    No facility-administered encounter medications on file as of 08/23/2019.      Objective:   Goals Addressed            This Visit's Progress     Patient Stated   . I would like to manage my chronic conditions (pt-stated)       Current Barriers:  [name] Knowledge Deficits related to disease state management  Pharmacist Clinical Goal(s):  [name] Over the next 90 days, patient will work with CCM team and PCP to address needs related to chronic conditions   Interventions: . Comprehensive medication review performed. . Discussed plans with patient for ongoing care management follow up and provided patient with direct contact information for care management team Asthma . Reviewed asthma medications, however patient states that she does not have to take her inhalers regularly per pulmonologist, Dr. Melvyn Novas.  This was discussed about 6 years ago. . Counseled on the use of rescue albuterol and symbicort (new guideline recommendations).  Patient carries inhalers with her when she is exercising or experiencing an illness (respiratory).  She states she rarely has to use her inhalers.  She denies needing a refill.  She is also managing her  symptom control with antihistamines as needed. Hypothyroidism . Patient is currently managing thyroid with brand name Synthroid.  This was started by her previous doctor that has since retired.  Patient is paying $44 /month for brand name Synthroid.  Will discuss with PCP.  The goal will be to keep patient on brand name Synthroid based on narrow therapeutic index.   o Patient does not wish to participate in "Synthroid Delivers" mail order program due to living in an apartment where the mail  room is closed for deliveries due to [place] and submitted tier excpetion for patient-faxed to [facility], will f/u in 2-3 weeks . T4/TSH within normal limits  Diabetes . A1c 5.9% on 06/15/19.  Patient is on monotherapy with metformin.  She denies adverse events.  She checks her blood sugars several times weekly and as needed.  A statin is being prescribed and filled.  . Will address full DM goal at next visit.  Please see past updates related to this goal by clicking on the "Past Updates" button in the selected goal          Plan:   The care management team will reach out to the patient again over the next 30 days.   Provider Signature Regina Eck, PharmD, BCPS Clinical Pharmacist, [facility] [place]: [phone]

## 2019-08-23 NOTE — Patient Instructions (Signed)
Visit Information  Goals Addressed            This Visit's Progress     Patient Stated   . I would like to manage my chronic conditions (pt-stated)       Current Barriers:  [name] Knowledge Deficits related to disease state management  Pharmacist Clinical Goal(s):  [name] Over the next 90 days, patient will work with CCM team and PCP to address needs related to chronic conditions   Interventions: . Comprehensive medication review performed. . Discussed plans with patient for ongoing care management follow up and provided patient with direct contact information for care management team Asthma . Reviewed asthma medications, however patient states that she does not have to take her inhalers regularly per pulmonologist, Dr. Melvyn Novas.  This was discussed about 6 years ago. . Counseled on the use of rescue albuterol and symbicort (new guideline recommendations).  Patient carries inhalers with her when she is exercising or experiencing an illness (respiratory).  She states she rarely has to use her inhalers.  She denies needing a refill.  She is also managing her symptom control with antihistamines as needed. Hypothyroidism . Patient is currently managing thyroid with brand name Synthroid.  This was started by her previous doctor that has since retired.  Patient is paying $44 /month for brand name Synthroid.  Will discuss with PCP.  The goal will be to keep patient on brand name Synthroid based on narrow therapeutic index.   o Patient does not wish to participate in "Synthroid Delivers" mail order program due to living in an apartment where the mail room is closed for deliveries due to [place] and submitted tier excpetion for patient-faxed to [facility], will f/u in 2-3 weeks . T4/TSH within normal limits  Diabetes . A1c 5.9% on 06/15/19.  Patient is on monotherapy with metformin.  She denies adverse events.  She checks her blood sugars several times weekly and as needed.  A statin is being prescribed  and filled.  . Will address full DM goal at next visit.  Please see past updates related to this goal by clicking on the "Past Updates" button in the selected goal         The patient verbalized understanding of instructions provided today and declined a print copy of patient instruction materials.   The care management team will reach out to the patient again over the next 30 days.   SIGNATURE Regina Eck, PharmD, BCPS Clinical Pharmacist, [facility] [place]: [phone]

## 2019-08-25 DIAGNOSIS — M5136 Other intervertebral disc degeneration, lumbar region: Secondary | ICD-10-CM | POA: Diagnosis not present

## 2019-08-25 DIAGNOSIS — M47814 Spondylosis without myelopathy or radiculopathy, thoracic region: Secondary | ICD-10-CM | POA: Diagnosis not present

## 2019-08-25 DIAGNOSIS — M9905 Segmental and somatic dysfunction of pelvic region: Secondary | ICD-10-CM | POA: Diagnosis not present

## 2019-08-25 DIAGNOSIS — M9903 Segmental and somatic dysfunction of lumbar region: Secondary | ICD-10-CM | POA: Diagnosis not present

## 2019-08-25 DIAGNOSIS — M5137 Other intervertebral disc degeneration, lumbosacral region: Secondary | ICD-10-CM | POA: Diagnosis not present

## 2019-08-25 DIAGNOSIS — M9902 Segmental and somatic dysfunction of thoracic region: Secondary | ICD-10-CM | POA: Diagnosis not present

## 2019-08-28 ENCOUNTER — Encounter: Payer: Medicare HMO | Admitting: Obstetrics & Gynecology

## 2019-08-28 DIAGNOSIS — M9905 Segmental and somatic dysfunction of pelvic region: Secondary | ICD-10-CM | POA: Diagnosis not present

## 2019-08-28 DIAGNOSIS — M5137 Other intervertebral disc degeneration, lumbosacral region: Secondary | ICD-10-CM | POA: Diagnosis not present

## 2019-08-28 DIAGNOSIS — M9903 Segmental and somatic dysfunction of lumbar region: Secondary | ICD-10-CM | POA: Diagnosis not present

## 2019-08-28 DIAGNOSIS — M47814 Spondylosis without myelopathy or radiculopathy, thoracic region: Secondary | ICD-10-CM | POA: Diagnosis not present

## 2019-08-28 DIAGNOSIS — M9902 Segmental and somatic dysfunction of thoracic region: Secondary | ICD-10-CM | POA: Diagnosis not present

## 2019-08-28 DIAGNOSIS — M5136 Other intervertebral disc degeneration, lumbar region: Secondary | ICD-10-CM | POA: Diagnosis not present

## 2019-08-29 ENCOUNTER — Telehealth: Payer: Self-pay

## 2019-08-29 NOTE — Telephone Encounter (Signed)
Call pt per Dr. Baird Cancer ask pt if she wants to do Synthroid for $75 /36months  Per pt her and the pharmacist tried calling the 1800 # it was not working, also because of [place] her building she lives in is not accepting packages to be delievered to the office, meaning her packages would be sitting outside and she doesn't want to do that. She would rather wait until COVID clears up some before taking the next step

## 2019-08-30 ENCOUNTER — Ambulatory Visit: Payer: Self-pay | Admitting: Pharmacist

## 2019-08-30 DIAGNOSIS — E1122 Type 2 diabetes mellitus with diabetic chronic kidney disease: Secondary | ICD-10-CM

## 2019-08-30 DIAGNOSIS — N183 Chronic kidney disease, stage 3 unspecified: Secondary | ICD-10-CM

## 2019-08-30 DIAGNOSIS — E039 Hypothyroidism, unspecified: Secondary | ICD-10-CM

## 2019-08-31 NOTE — Patient Instructions (Addendum)
Visit Information  Goals Addressed            This Visit's Progress     Patient Stated   . I would like to manage my chronic conditions (pt-stated)       Current Barriers:  [name] Knowledge Deficits related to disease state management  Pharmacist Clinical Goal(s):  [name] Over the next 90 days, patient will work with CCM team and PCP to address needs related to chronic conditions   Interventions: . Comprehensive medication review performed. . Discussed plans with patient for ongoing care management follow up and provided patient with direct contact information for care management team Asthma . Reviewed asthma medications, however patient states that she does not have to take her inhalers regularly per pulmonologist, Dr. Melvyn Novas.  This was discussed about 6 years ago. . Counseled on the use of rescue albuterol and symbicort (new guideline recommendations).  Patient carries inhalers with her when she is exercising or experiencing an illness (respiratory).  She states she rarely has to use her inhalers.  She denies needing a refill.  She is also managing her symptom control with antihistamines as needed. Hypothyroidism . Patient is currently managing thyroid with brand name Synthroid.  This was started by her previous doctor that has since retired.  Patient is paying $44 /month for brand name Synthroid.  Will discuss with PCP.  The goal will be to keep patient on brand name Synthroid based on narrow therapeutic index.   o Patient does not wish to participate in "Synthroid Delivers" mail order program due to living in an apartment where the mail room is closed for deliveries due to [place]. Potential dose changes o Prepared and submitted tier excpetion for patient-faxed to [facility], will f/u in 2-3 weeks . T4/TSH within normal limits  Diabetes . A1c 5.9% on 06/15/19.  Patient is on monotherapy with metformin.  She denies intolerance.  She checks her blood sugars several times weekly and as needed.  A statin  is being prescribed and filled.  . Will address full DM goal at next visit.  Please see past updates related to this goal by clicking on the "Past Updates" button in the selected goal      . I would like to manage my diabetes (pt-stated)       Current Barriers:  . Diabetes: T2DM; most recent A1c 5.9% on 06/15/19 . Current antihyperglycemic regimen: metformin 500mg  BID . Denies hypoglycemic symptoms; denies hyperglycemic symptoms . Current meal patterns: o Tries to incorporate protein, veggie, small portion carbohydrate o Drinks:water, coffee (avoids sugary drinks) . Current exercise: silver sneakers twice weekly (wearing mask, social distancing) . Current blood glucose readings: FBG 80-90s, recently has reported Bgs 72, 75 (asymptomatic)--likely due to increased physical activity. Counseled on signs and symptoms of low blood sugar (Bg<70) and how to treat.  Encouraged patient to call if consistently "low".  We will make adjustments to metformin as needed. . Cardiovascular risk reduction: o Current hypertensive regimen: irbesartan, amlodipine o Current hyperlipidemia regimen: atorvastatin 80mg  qHS  Pharmacist Clinical Goal(s):  [name] Over the next 90 days, patient will work with PharmD and primary care provider to address needs related to optimized medication management of diabetes  Interventions: . Comprehensive medication review performed, medication list updated in electronic medical record   Patient Self Care Activities:  . Patient will check blood glucose daily (and as needed), document, and provide at future appointments . Patient will focus on medication adherence by continuing to take medication as prescribed .  Patient will take medications as prescribed . Patient will contact provider with any episodes of hypoglycemia . Patient will report any questions or concerns to provider   Initial goal documentation        The patient verbalized understanding of instructions provided  today and declined a print copy of patient instruction materials.   The care management team will reach out to the patient again over the next 14 days.   SIGNATURE Regina Eck, PharmD, BCPS Clinical Pharmacist, [facility] [place]: [phone]

## 2019-08-31 NOTE — Progress Notes (Signed)
Chronic Care Management    Visit Note  08/30/2019 Name: Hannah [name] MRN: [id number] DOB: April 02, 1946  Referred by: Glendale Chard, MD Reason for referral : Chronic care management-Diabetes, HLD, HTN   Hannah [name] is a 73 y.o. year old female who is a primary care patient of Glendale Chard, MD. The CCM team was consulted for assistance with chronic disease management and care coordination needs related to HLD and DMII  Review of patient status, including review of consultants reports, relevant laboratory and other test results, and collaboration with appropriate care team members and the patient's provider was performed as part of comprehensive patient evaluation and provision of chronic care management services.    I spoke with Hannah [name] by telephone today.  Medications: Outpatient Encounter Medications as of 08/30/2019  Medication Sig Note  . albuterol (PROVENTIL HFA;VENTOLIN HFA) 108 (90 Base) MCG/ACT inhaler Inhale 2 puffs into the lungs every 6 (six) hours as needed for wheezing or shortness of breath.   . allopurinol (ZYLOPRIM) 100 MG tablet TAKE 1 TABLET BY MOUTH EVERY DAY FOR GOUT   . amLODipine (NORVASC) 5 MG tablet Take 1 tablet (5 mg total) by mouth daily. 06/28/2019: Patient reports taking 2.5 mg in AM and 5 mg in PM per PCP directions  . aspirin EC 81 MG tablet Take 81 mg by mouth daily.   [name] atorvastatin (LIPITOR) 80 MG tablet TAKE 1 TABLET BY MOUTH EVERY DAY   . budesonide-formoterol (SYMBICORT) 160-4.5 MCG/ACT inhaler INHALE 2 PUFFS BY MOUTH TWICE DAILY IN THE MORNING AND EVENING (Patient not taking: Reported on 08/30/2019)   . Calcium Carb-Cholecalciferol (CALCIUM 600+D) 600-800 MG-UNIT TABS Take by mouth 2 (two) times daily.    . Cholecalciferol (VITAMIN D PO) Take 1,000 Units by mouth daily.    [name] denosumab (PROLIA) 60 MG/ML SOSY injection Inject 60 mg into the skin every 6 (six) months.   . hydroxychloroquine (PLAQUENIL) 200 MG tablet TAKE 1 TABLET BY MOUTH  DAILY FOR RHEUMATOID ARTHRITIS   . Hypromellose (ARTIFICIAL TEARS OP) Apply to eye as needed.   . irbesartan (AVAPRO) 300 MG tablet Take 1 tablet (300 mg total) by mouth daily.   . metFORMIN (GLUCOPHAGE) 500 MG tablet TAKE 1 TABLET BY MOUTH TWICE DAILY WITH THE MORNING AND EVENING MEAL   . SYNTHROID 88 MCG tablet TAKE 1 TABLET BY MOUTH EVERY DAY MONDAY TO SATURDAY AND OFF SUNDAYS    No facility-administered encounter medications on file as of 08/30/2019.      Objective:   Goals Addressed            This Visit's Progress     Patient Stated   . I would like to manage my chronic conditions (pt-stated)       Current Barriers:  [name] Knowledge Deficits related to disease state management  Pharmacist Clinical Goal(s):  [name] Over the next 90 days, patient will work with CCM team and PCP to address needs related to chronic conditions   Interventions: . Comprehensive medication review performed. . Discussed plans with patient for ongoing care management follow up and provided patient with direct contact information for care management team Asthma . Reviewed asthma medications, however patient states that she does not have to take her inhalers regularly per pulmonologist, Dr. Melvyn Novas.  This was discussed about 6 years ago. . Counseled on the use of rescue albuterol and symbicort (new guideline recommendations).  Patient carries inhalers with her when she is exercising or experiencing an illness (respiratory).  She states  she rarely has to use her inhalers.  She denies needing a refill.  She is also managing her symptom control with antihistamines as needed. Hypothyroidism . Patient is currently managing thyroid with brand name Synthroid.  This was started by her previous doctor that has since retired.  Patient is paying $44 /month for brand name Synthroid.  Will discuss with PCP.  The goal will be to keep patient on brand name Synthroid based on narrow therapeutic index.   o Patient does not wish to  participate in "Synthroid Delivers" mail order program due to living in an apartment where the mail room is closed for deliveries due to [place]. Potential dose changes o Prepared and submitted tier excpetion for patient-faxed to [facility], will f/u in 2-3 weeks . T4/TSH within normal limits  Diabetes . A1c 5.9% on 06/15/19.  Patient is on monotherapy with metformin.  She denies intolerance.  She checks her blood sugars several times weekly and as needed.  A statin is being prescribed and filled.  . Will address full DM goal at next visit.  Please see past updates related to this goal by clicking on the "Past Updates" button in the selected goal      . I would like to manage my diabetes (pt-stated)       Current Barriers:  . Diabetes: T2DM; most recent A1c 5.9% on 06/15/19 . Current antihyperglycemic regimen: metformin 500mg  BID . Denies hypoglycemic symptoms; denies hyperglycemic symptoms . Current meal patterns: o Tries to incorporate protein, veggie, small portion carbohydrate o Drinks:water, coffee (avoids sugary drinks) . Current exercise: silver sneakers twice weekly (wearing mask, social distancing) . Current blood glucose readings: FBG 80-90s, recently has reported Bgs 72, 75 (asymptomatic)--likely due to increased physical activity. Counseled on signs and symptoms of low blood sugar (Bg<70) and how to treat.  Encouraged patient to call if consistently "low".  We will make adjustments to metformin as needed. . Cardiovascular risk reduction: o Current hypertensive regimen: irbesartan, amlodipine o Current hyperlipidemia regimen: atorvastatin 80mg  qHS  Pharmacist Clinical Goal(s):  [name] Over the next 90 days, patient will work with PharmD and primary care provider to address needs related to optimized medication management of diabetes  Interventions: . Comprehensive medication review performed, medication list updated in electronic medical record   Patient Self Care Activities:  .  Patient will check blood glucose daily (and as needed), document, and provide at future appointments . Patient will focus on medication adherence by continuing to take medication as prescribed . Patient will take medications as prescribed . Patient will contact provider with any episodes of hypoglycemia . Patient will report any questions or concerns to provider   Initial goal documentation         Plan:   The care management team will reach out to the patient again over the next 14 days.   Provider Signature Regina Eck, PharmD, BCPS Clinical Pharmacist, [facility] [place]: [phone]

## 2019-09-01 DIAGNOSIS — M9905 Segmental and somatic dysfunction of pelvic region: Secondary | ICD-10-CM | POA: Diagnosis not present

## 2019-09-01 DIAGNOSIS — M5136 Other intervertebral disc degeneration, lumbar region: Secondary | ICD-10-CM | POA: Diagnosis not present

## 2019-09-01 DIAGNOSIS — M9903 Segmental and somatic dysfunction of lumbar region: Secondary | ICD-10-CM | POA: Diagnosis not present

## 2019-09-01 DIAGNOSIS — M5137 Other intervertebral disc degeneration, lumbosacral region: Secondary | ICD-10-CM | POA: Diagnosis not present

## 2019-09-01 DIAGNOSIS — M47814 Spondylosis without myelopathy or radiculopathy, thoracic region: Secondary | ICD-10-CM | POA: Diagnosis not present

## 2019-09-01 DIAGNOSIS — M9902 Segmental and somatic dysfunction of thoracic region: Secondary | ICD-10-CM | POA: Diagnosis not present

## 2019-09-04 ENCOUNTER — Ambulatory Visit: Payer: Self-pay | Admitting: Pharmacist

## 2019-09-04 ENCOUNTER — Other Ambulatory Visit: Payer: Self-pay

## 2019-09-04 DIAGNOSIS — E039 Hypothyroidism, unspecified: Secondary | ICD-10-CM

## 2019-09-04 NOTE — Progress Notes (Signed)
Chronic Care Management   Visit Note  09/04/2019 Name: ELEYAH HANEL MRN: [id number] DOB: October 26, 1945  Referred by: Glendale Chard, MD Reason for referral : Chronic Care Management   Cambree NASHA SERVANTEZ is a 73 y.o. year old female who is a primary care patient of Glendale Chard, MD. The CCM team was consulted for assistance with chronic disease management and care coordination needs related to hypothyroidism  Review of patient status, including review of consultants reports, relevant laboratory and other test results, and collaboration with appropriate care team members and the patient's provider was performed as part of comprehensive patient evaluation and provision of chronic care management services.     Medications: Outpatient Encounter Medications as of 09/04/2019  Medication Sig Note  . albuterol (PROVENTIL HFA;VENTOLIN HFA) 108 (90 Base) MCG/ACT inhaler Inhale 2 puffs into the lungs every 6 (six) hours as needed for wheezing or shortness of breath.   . allopurinol (ZYLOPRIM) 100 MG tablet TAKE 1 TABLET BY MOUTH EVERY DAY FOR GOUT   . amLODipine (NORVASC) 5 MG tablet Take 1 tablet (5 mg total) by mouth daily. 06/28/2019: Patient reports taking 2.5 mg in AM and 5 mg in PM per PCP directions  . aspirin EC 81 MG tablet Take 81 mg by mouth daily.   [name] atorvastatin (LIPITOR) 80 MG tablet TAKE 1 TABLET BY MOUTH EVERY DAY   . budesonide-formoterol (SYMBICORT) 160-4.5 MCG/ACT inhaler INHALE 2 PUFFS BY MOUTH TWICE DAILY IN THE MORNING AND EVENING (Patient not taking: Reported on 08/30/2019)   . Calcium Carb-Cholecalciferol (CALCIUM 600+D) 600-800 MG-UNIT TABS Take by mouth 2 (two) times daily.    . Cholecalciferol (VITAMIN D PO) Take 1,000 Units by mouth daily.    [name] denosumab (PROLIA) 60 MG/ML SOSY injection Inject 60 mg into the skin every 6 (six) months.   . hydroxychloroquine (PLAQUENIL) 200 MG tablet TAKE 1 TABLET BY MOUTH DAILY FOR RHEUMATOID ARTHRITIS   . Hypromellose (ARTIFICIAL  TEARS OP) Apply to eye as needed.   . irbesartan (AVAPRO) 300 MG tablet Take 1 tablet (300 mg total) by mouth daily.   . metFORMIN (GLUCOPHAGE) 500 MG tablet TAKE 1 TABLET BY MOUTH TWICE DAILY WITH THE MORNING AND EVENING MEAL   . SYNTHROID 88 MCG tablet TAKE 1 TABLET BY MOUTH EVERY DAY MONDAY TO SATURDAY AND OFF SUNDAYS    No facility-administered encounter medications on file as of 09/04/2019.      Objective:   Goals Addressed            This Visit's Progress     Patient Stated   . I would like to manage my chronic conditions (pt-stated)       Current Barriers:  [name] Knowledge Deficits related to disease state management  Pharmacist Clinical Goal(s):  [name] Over the next 90 days, patient will work with CCM team and PCP to address needs related to chronic conditions   Interventions: . Comprehensive medication review performed. . Discussed plans with patient for ongoing care management follow up and provided patient with direct contact information for care management team Asthma . Reviewed asthma medications, however patient states that she does not have to take her inhalers regularly per pulmonologist, Dr. Melvyn Novas.  This was discussed about 6 years ago. . Counseled on the use of rescue albuterol and symbicort (new guideline recommendations).  Patient carries inhalers with her when she is exercising or experiencing an illness (respiratory).  She states she rarely has to use her inhalers.  She denies needing a refill.  She is also managing her symptom control with antihistamines as needed. Hypothyroidism . Patient is currently managing thyroid with brand name Synthroid.  This was started by her previous doctor that has since retired.  Patient is paying $44 /month for brand name Synthroid.  Will discuss with PCP.  The goal will be to keep patient on brand name Synthroid based on narrow therapeutic index.   o Patient does not wish to participate in "Synthroid Delivers" mail order program due to  living in an apartment where the mail room is closed for deliveries due to [place]. Potential dose changes o Prepared and submitted tier excpetion for patient-faxed to [employer]--> 09/04/19-application denied, will call to try appeal process . T4/TSH within normal limits  Diabetes . A1c 5.9% on 06/15/19.  Patient is on monotherapy with metformin.  She denies intolerance.  She checks her blood sugars several times weekly and as needed.  A statin is being prescribed and filled.  . Will address full DM goal at next visit.  Please see past updates related to this goal by clicking on the "Past Updates" button in the selected goal         Plan:   The care management team will reach out to the patient again over the next 14 days.   Provider Signature Regina Eck, PharmD, BCPS Clinical Pharmacist, [facility] [place]: [phone]

## 2019-09-04 NOTE — Patient Instructions (Signed)
Visit Information  Goals Addressed            This Visit's Progress     Patient Stated   . I would like to manage my chronic conditions (pt-stated)       Current Barriers:  [name] Knowledge Deficits related to disease state management  Pharmacist Clinical Goal(s):  [name] Over the next 90 days, patient will work with CCM team and PCP to address needs related to chronic conditions   Interventions: . Comprehensive medication review performed. . Discussed plans with patient for ongoing care management follow up and provided patient with direct contact information for care management team Asthma . Reviewed asthma medications, however patient states that she does not have to take her inhalers regularly per pulmonologist, Dr. Melvyn Novas.  This was discussed about 6 years ago. . Counseled on the use of rescue albuterol and symbicort (new guideline recommendations).  Patient carries inhalers with her when she is exercising or experiencing an illness (respiratory).  She states she rarely has to use her inhalers.  She denies needing a refill.  She is also managing her symptom control with antihistamines as needed. Hypothyroidism . Patient is currently managing thyroid with brand name Synthroid.  This was started by her previous doctor that has since retired.  Patient is paying $44 /month for brand name Synthroid.  Will discuss with PCP.  The goal will be to keep patient on brand name Synthroid based on narrow therapeutic index.   o Patient does not wish to participate in "Synthroid Delivers" mail order program due to living in an apartment where the mail room is closed for deliveries due to [place]. Potential dose changes o Prepared and submitted tier excpetion for patient-faxed to [employer]--> 09/04/19-application denied, will call to try appeal process . T4/TSH within normal limits  Diabetes . A1c 5.9% on 06/15/19.  Patient is on monotherapy with metformin.  She denies intolerance.  She checks her blood sugars  several times weekly and as needed.  A statin is being prescribed and filled.  . Will address full DM goal at next visit.  Please see past updates related to this goal by clicking on the "Past Updates" button in the selected goal         The patient verbalized understanding of instructions provided today and declined a print copy of patient instruction materials.   The care management team will reach out to the patient again over the next 14 days.   SIGNATURE Regina Eck, PharmD, BCPS Clinical Pharmacist, [facility] [place]: [phone]

## 2019-09-05 ENCOUNTER — Encounter: Payer: Self-pay | Admitting: Obstetrics & Gynecology

## 2019-09-05 ENCOUNTER — Ambulatory Visit: Payer: Medicare HMO | Admitting: Obstetrics & Gynecology

## 2019-09-05 VITALS — BP 136/70 | Ht <= 58 in | Wt 161.0 lb

## 2019-09-05 DIAGNOSIS — Z124 Encounter for screening for malignant neoplasm of cervix: Secondary | ICD-10-CM

## 2019-09-05 DIAGNOSIS — Z01419 Encounter for gynecological examination (general) (routine) without abnormal findings: Secondary | ICD-10-CM | POA: Diagnosis not present

## 2019-09-05 DIAGNOSIS — Z9189 Other specified personal risk factors, not elsewhere classified: Secondary | ICD-10-CM

## 2019-09-05 DIAGNOSIS — E6609 Other obesity due to excess calories: Secondary | ICD-10-CM

## 2019-09-05 DIAGNOSIS — Z6833 Body mass index (BMI) 33.0-33.9, adult: Secondary | ICD-10-CM

## 2019-09-05 DIAGNOSIS — Z780 Asymptomatic menopausal state: Secondary | ICD-10-CM

## 2019-09-05 DIAGNOSIS — M810 Age-related osteoporosis without current pathological fracture: Secondary | ICD-10-CM

## 2019-09-05 NOTE — Progress Notes (Signed)
Hannah [name] February 27, 1946 [id number]   History:    73 y.o. G1P1L0 Single. Very active with her sister at [facility].  RP: Established patient presentingfor annual gyn exam   HPI: Menopause. No HRT. No PMB. No pelvic pain. Breasts wnl. Urine/BMs wnl. BMI 33.65.  Exercising regularly, restarting at the [facility], healthy nutrition.  Per patient, needs knee replacement. Chronic Anemia, seen by Hemato, doing IV Iron. No recent skin exam, many moles, seen by Dermato, all benign appearance. Fam MD Dr Baird Cancer for DM, cHTN, Asthma...  Past medical history,surgical history, family history and social history were all reviewed and documented in the EPIC chart.  Gynecologic History No LMP recorded. Patient is postmenopausal.  Obstetric History OB History  Gravida Para Term Preterm AB Living  1 1       0  SAB TAB Ectopic Multiple Live Births               # Outcome Date GA Lbr Len/2nd Weight Sex Delivery Anes PTL Lv  1 Para             Obstetric Comments  Baby died from CHF     ROS: A ROS was performed and pertinent positives and negatives are included in the history.  GENERAL: No fevers or chills. HEENT: No change in vision, no earache, sore throat or sinus congestion. NECK: No pain or stiffness. CARDIOVASCULAR: No chest pain or pressure. No palpitations. PULMONARY: No shortness of breath, cough or wheeze. GASTROINTESTINAL: No abdominal pain, nausea, vomiting or diarrhea, melena or bright red blood per rectum. GENITOURINARY: No urinary frequency, urgency, hesitancy or dysuria. MUSCULOSKELETAL: No joint or muscle pain, no back pain, no recent trauma. DERMATOLOGIC: No rash, no itching, no lesions. ENDOCRINE: No polyuria, polydipsia, no heat or cold intolerance. No recent change in weight. HEMATOLOGICAL: No anemia or easy bruising or bleeding. NEUROLOGIC: No headache, seizures, numbness, tingling or weakness. PSYCHIATRIC: No depression, no loss of interest in normal activity or change in  sleep pattern.     Exam:   BP 136/70   Ht 4\' 10"  (1.473 m)   Wt 161 lb (73 kg)   BMI 33.65 kg/m   Body mass index is 33.65 kg/m.  General appearance : Well developed well nourished female. No acute distress HEENT: Eyes: no retinal hemorrhage or exudates,  Neck supple, trachea midline, no carotid bruits, no thyroidmegaly Lungs: Clear to auscultation, no rhonchi or wheezes, or rib retractions  Heart: Regular rate and rhythm, no murmurs or gallops Breast:Examined in sitting and supine position were symmetrical in appearance, no palpable masses or tenderness,  no skin retraction, no nipple inversion, no nipple discharge, no skin discoloration, no axillary or supraclavicular lymphadenopathy Abdomen: no palpable masses or tenderness, no rebound or guarding Extremities: no edema or skin discoloration or tenderness  Pelvic: Vulva: Normal             Vagina: No gross lesions or discharge  Cervix: No gross lesions or discharge.  Pap reflex done.  Uterus  AV, normal size, shape and consistency, non-tender and mobile  Adnexa  Without masses or tenderness  Anus: Normal   Assessment/Plan:  73 y.o. female for annual exam   1. Encounter for routine gynecological examination with Papanicolaou smear of cervix Normal gynecologic exam in menopause.  Pap reflex done.  Breast exam normal.  Screening mammogram June 2020 was negative.  Colonoscopy in 2014.  Health labs with Dr. Baird Cancer.  2. Postmenopausal Well on no hormone replacement therapy.  No postmenopausal  bleeding.  3. Age-related osteoporosis without current pathological fracture Osteoporosis on Prolia, followed by Endocrinologist.  Vitamin D supplements, calcium intake of 1200 mg daily and regular weightbearing physical activities.  Doing bone densities every 2 years.  4. Class 1 obesity due to excess calories with serious comorbidity and body mass index (BMI) of 33.0 to 33.9 in adult Recommend a lower calorie/carb diet such as [employer].  Reincreasing physical activities to aerobic five times a week and light weightlifting every 2 days.  Princess Bruins MD, 12:14 PM 09/05/2019

## 2019-09-05 NOTE — Patient Instructions (Signed)
1. Encounter for routine gynecological examination with Papanicolaou smear of cervix Normal gynecologic exam in menopause.  Pap reflex done.  Breast exam normal.  Screening mammogram June 2020 was negative.  Colonoscopy in 2014.  Health labs with Dr. Baird Cancer.  2. Postmenopausal Well on no hormone replacement therapy.  No postmenopausal bleeding.  3. Age-related osteoporosis without current pathological fracture Osteoporosis on Prolia, followed by Endocrinologist.  Vitamin D supplements, calcium intake of 1200 mg daily and regular weightbearing physical activities.  Doing bone densities every 2 years.  4. Class 1 obesity due to excess calories with serious comorbidity and body mass index (BMI) of 33.0 to 33.9 in adult Recommend a lower calorie/carb diet such as [employer].  Reincreasing physical activities to aerobic five times a week and light weightlifting every 2 days.  Hannah [name], it was a pleasure seeing you today!  I will inform you of your results as soon as they are available.

## 2019-09-06 ENCOUNTER — Ambulatory Visit (INDEPENDENT_AMBULATORY_CARE_PROVIDER_SITE_OTHER): Payer: Medicare HMO | Admitting: Pharmacist

## 2019-09-06 DIAGNOSIS — N183 Chronic kidney disease, stage 3 unspecified: Secondary | ICD-10-CM | POA: Diagnosis not present

## 2019-09-06 DIAGNOSIS — E1122 Type 2 diabetes mellitus with diabetic chronic kidney disease: Secondary | ICD-10-CM | POA: Diagnosis not present

## 2019-09-06 LAB — PAP IG W/ RFLX HPV ASCU

## 2019-09-08 ENCOUNTER — Other Ambulatory Visit: Payer: Self-pay | Admitting: Physician Assistant

## 2019-09-08 DIAGNOSIS — L821 Other seborrheic keratosis: Secondary | ICD-10-CM | POA: Diagnosis not present

## 2019-09-08 DIAGNOSIS — B078 Other viral warts: Secondary | ICD-10-CM | POA: Diagnosis not present

## 2019-09-08 DIAGNOSIS — D485 Neoplasm of uncertain behavior of skin: Secondary | ICD-10-CM | POA: Diagnosis not present

## 2019-09-08 NOTE — Progress Notes (Signed)
Chronic Care Management   Visit Note  09/06/2019 Name: Hannah [name] MRN: [id number] DOB: 30-Oct-1945  Referred by: Glendale Chard, MD Reason for referral : Chronic Care Management   Hannah [name] is a 73 y.o. year old female who is a primary care patient of Glendale Chard, MD. The CCM team was consulted for assistance with chronic disease management and care coordination needs related to DMII  Review of patient status, including review of consultants reports, relevant laboratory and other test results, and collaboration with appropriate care team members and the patient's provider was performed as part of comprehensive patient evaluation and provision of chronic care management services.    I spoke with Ms. Rosenkranz by telephone today.  Medications: Outpatient Encounter Medications as of 09/06/2019  Medication Sig Note  . albuterol (PROVENTIL HFA;VENTOLIN HFA) 108 (90 Base) MCG/ACT inhaler Inhale 2 puffs into the lungs every 6 (six) hours as needed for wheezing or shortness of breath.   . allopurinol (ZYLOPRIM) 100 MG tablet TAKE 1 TABLET BY MOUTH EVERY DAY FOR GOUT   . amLODipine (NORVASC) 5 MG tablet Take 1 tablet (5 mg total) by mouth daily. 06/28/2019: Patient reports taking 2.5 mg in AM and 5 mg in PM per PCP directions  . aspirin EC 81 MG tablet Take 81 mg by mouth daily.   [name] atorvastatin (LIPITOR) 80 MG tablet TAKE 1 TABLET BY MOUTH EVERY DAY   . budesonide-formoterol (SYMBICORT) 160-4.5 MCG/ACT inhaler INHALE 2 PUFFS BY MOUTH TWICE DAILY IN THE MORNING AND EVENING   . Calcium Carb-Cholecalciferol (CALCIUM 600+D) 600-800 MG-UNIT TABS Take by mouth 2 (two) times daily.    . Cholecalciferol (VITAMIN D PO) Take 1,000 Units by mouth daily.    [name] denosumab (PROLIA) 60 MG/ML SOSY injection Inject 60 mg into the skin every 6 (six) months.   . hydroxychloroquine (PLAQUENIL) 200 MG tablet TAKE 1 TABLET BY MOUTH DAILY FOR RHEUMATOID ARTHRITIS   . Hypromellose (ARTIFICIAL TEARS OP)  Apply to eye as needed.   . irbesartan (AVAPRO) 300 MG tablet Take 1 tablet (300 mg total) by mouth daily.   . metFORMIN (GLUCOPHAGE) 500 MG tablet TAKE 1 TABLET BY MOUTH TWICE DAILY WITH THE MORNING AND EVENING MEAL   . SYNTHROID 88 MCG tablet TAKE 1 TABLET BY MOUTH EVERY DAY MONDAY TO SATURDAY AND OFF SUNDAYS    No facility-administered encounter medications on file as of 09/06/2019.     Objective:   Goals Addressed            This Visit's Progress     Patient Stated   . I would like to manage my diabetes (pt-stated)       Current Barriers:  . Diabetes: T2DM; most recent A1c 5.9% on 06/15/19 . Current antihyperglycemic regimen: metformin 500mg  BID . Denies hypoglycemic symptoms; denies hyperglycemic symptoms . Current meal patterns: o Tries to incorporate protein, veggie, small portion carbohydrate o Drinks:water, coffee (avoids sugary drinks) . Current exercise: silver sneakers twice weekly (wearing mask, social distancing) . Current blood glucose readings: FBG 80-90s, no reported Bgs in the 70s or <70.  Patient has started working out twice [employer] [facility], hospital). Counseled on signs and symptoms of low blood sugar (Bg<70) and how to treat.  Encouraged patient to call PCP office if experiencings "lows".  We will make adjustments to metformin as needed. . Cardiovascular risk reduction: o Current hypertensive regimen: irbesartan, amlodipine o Current hyperlipidemia regimen: atorvastatin 80mg  qHS  Pharmacist Clinical Goal(s):  [name] Over the next  90 days, patient will work with PharmD and primary care provider to address needs related to optimized medication management of diabetes  Interventions: . Comprehensive medication review performed, medication list updated in electronic medical record   Patient Self Care Activities:  . Patient will check blood glucose daily (and as needed), document, and provide at future appointments . Patient will focus on medication adherence by  continuing to take medication as prescribed . Patient will take medications as prescribed . Patient will contact provider with any episodes of hypoglycemia . Patient will report any questions or concerns to provider   Initial goal documentation         Plan:   The care management team will reach out to the patient again over the next 6-8 weeks   Provider Signature Regina Eck, PharmD, [place] Pharmacist, [facility] [place]: [phone]

## 2019-09-08 NOTE — Patient Instructions (Signed)
Visit Information  Goals Addressed            This Visit's Progress     Patient Stated   . I would like to manage my diabetes (pt-stated)       Current Barriers:  . Diabetes: T2DM; most recent A1c 5.9% on 06/15/19 . Current antihyperglycemic regimen: metformin 500mg  BID . Denies hypoglycemic symptoms; denies hyperglycemic symptoms . Current meal patterns: o Tries to incorporate protein, veggie, small portion carbohydrate o Drinks:water, coffee (avoids sugary drinks) . Current exercise: silver sneakers twice weekly (wearing mask, social distancing) . Current blood glucose readings: FBG 80-90s, no reported Bgs in the 70s or <70.  Patient has started working out twice [employer] [facility], hospital). Counseled on signs and symptoms of low blood sugar (Bg<70) and how to treat.  Encouraged patient to call PCP office if experiencings "lows".  We will make adjustments to metformin as needed. . Cardiovascular risk reduction: o Current hypertensive regimen: irbesartan, amlodipine o Current hyperlipidemia regimen: atorvastatin 80mg  qHS  Pharmacist Clinical Goal(s):  [name] Over the next 90 days, patient will work with PharmD and primary care provider to address needs related to optimized medication management of diabetes  Interventions: . Comprehensive medication review performed, medication list updated in electronic medical record   Patient Self Care Activities:  . Patient will check blood glucose daily (and as needed), document, and provide at future appointments . Patient will focus on medication adherence by continuing to take medication as prescribed . Patient will take medications as prescribed . Patient will contact provider with any episodes of hypoglycemia . Patient will report any questions or concerns to provider   Initial goal documentation        The patient verbalized understanding of instructions provided today and declined a print copy of patient instruction materials.    The care management team will reach out to the patient again over the next 6-8 weeks   SIGNATURE Regina Eck, PharmD, [place] Pharmacist, [place]: [phone]

## 2019-09-11 DIAGNOSIS — M5137 Other intervertebral disc degeneration, lumbosacral region: Secondary | ICD-10-CM | POA: Diagnosis not present

## 2019-09-11 DIAGNOSIS — M47814 Spondylosis without myelopathy or radiculopathy, thoracic region: Secondary | ICD-10-CM | POA: Diagnosis not present

## 2019-09-11 DIAGNOSIS — M5136 Other intervertebral disc degeneration, lumbar region: Secondary | ICD-10-CM | POA: Diagnosis not present

## 2019-09-11 DIAGNOSIS — M9902 Segmental and somatic dysfunction of thoracic region: Secondary | ICD-10-CM | POA: Diagnosis not present

## 2019-09-11 DIAGNOSIS — M9903 Segmental and somatic dysfunction of lumbar region: Secondary | ICD-10-CM | POA: Diagnosis not present

## 2019-09-11 DIAGNOSIS — M9905 Segmental and somatic dysfunction of pelvic region: Secondary | ICD-10-CM | POA: Diagnosis not present

## 2019-09-15 DIAGNOSIS — M5136 Other intervertebral disc degeneration, lumbar region: Secondary | ICD-10-CM | POA: Diagnosis not present

## 2019-09-15 DIAGNOSIS — M9905 Segmental and somatic dysfunction of pelvic region: Secondary | ICD-10-CM | POA: Diagnosis not present

## 2019-09-15 DIAGNOSIS — M5137 Other intervertebral disc degeneration, lumbosacral region: Secondary | ICD-10-CM | POA: Diagnosis not present

## 2019-09-15 DIAGNOSIS — M47814 Spondylosis without myelopathy or radiculopathy, thoracic region: Secondary | ICD-10-CM | POA: Diagnosis not present

## 2019-09-15 DIAGNOSIS — M9903 Segmental and somatic dysfunction of lumbar region: Secondary | ICD-10-CM | POA: Diagnosis not present

## 2019-09-15 DIAGNOSIS — M9902 Segmental and somatic dysfunction of thoracic region: Secondary | ICD-10-CM | POA: Diagnosis not present

## 2019-09-19 ENCOUNTER — Other Ambulatory Visit: Payer: Self-pay

## 2019-09-19 DIAGNOSIS — M9903 Segmental and somatic dysfunction of lumbar region: Secondary | ICD-10-CM | POA: Diagnosis not present

## 2019-09-19 DIAGNOSIS — M5136 Other intervertebral disc degeneration, lumbar region: Secondary | ICD-10-CM | POA: Diagnosis not present

## 2019-09-19 DIAGNOSIS — M47814 Spondylosis without myelopathy or radiculopathy, thoracic region: Secondary | ICD-10-CM | POA: Diagnosis not present

## 2019-09-19 DIAGNOSIS — M5137 Other intervertebral disc degeneration, lumbosacral region: Secondary | ICD-10-CM | POA: Diagnosis not present

## 2019-09-19 DIAGNOSIS — M9902 Segmental and somatic dysfunction of thoracic region: Secondary | ICD-10-CM | POA: Diagnosis not present

## 2019-09-19 DIAGNOSIS — M9905 Segmental and somatic dysfunction of pelvic region: Secondary | ICD-10-CM | POA: Diagnosis not present

## 2019-09-19 MED ORDER — SYNTHROID 88 MCG PO TABS: ORAL_TABLET | ORAL | 0 refills | Status: DC

## 2019-09-25 ENCOUNTER — Other Ambulatory Visit: Payer: Self-pay

## 2019-09-25 MED ORDER — BUDESONIDE-FORMOTEROL FUMARATE 160-4.5 MCG/ACT IN AERO: INHALATION_SPRAY | RESPIRATORY_TRACT | 2 refills | Status: DC

## 2019-09-26 ENCOUNTER — Telehealth: Payer: Self-pay | Admitting: Rheumatology

## 2019-09-26 ENCOUNTER — Other Ambulatory Visit: Payer: Self-pay

## 2019-09-26 DIAGNOSIS — M069 Rheumatoid arthritis, unspecified: Secondary | ICD-10-CM

## 2019-09-26 DIAGNOSIS — M0579 Rheumatoid arthritis with rheumatoid factor of multiple sites without organ or systems involvement: Secondary | ICD-10-CM

## 2019-09-26 MED ORDER — ACCU-CHEK SOFTCLIX LANCETS MISC: 2 refills | Status: DC

## 2019-09-26 MED ORDER — ALCOHOL PADS 70 % PADS: MEDICATED_PAD | 2 refills | Status: DC

## 2019-09-26 MED ORDER — ACCU-CHEK AVIVA PLUS W/DEVICE KIT: PACK | 1 refills | Status: DC

## 2019-09-26 MED ORDER — ACCU-CHEK AVIVA PLUS VI STRP: ORAL_STRIP | 2 refills | Status: DC

## 2019-09-26 NOTE — Telephone Encounter (Signed)
Patient left a voicemail stating she called [facility] 2 days ago for her prescription refill of Plaquenil.  Patient states the pharmacy told her they tried reaching our office and haven't received a response.

## 2019-09-26 NOTE — Telephone Encounter (Signed)
Last Visit: 05/16/2019 Next Visit: 10/17/2019 Labs: 06/26/2019 GFR 51, creat 1.22, calcium 8.5, absolute eosinophils 0.6, HCT 32.6, hemoglobin 10.4, RBC 3.33 Eye exam: 08/17/2019   Okay to refill PLQ?

## 2019-09-27 MED ORDER — HYDROXYCHLOROQUINE SULFATE 200 MG PO TABS: ORAL_TABLET | ORAL | 0 refills | Status: DC

## 2019-09-27 NOTE — Telephone Encounter (Signed)
ok 

## 2019-09-27 NOTE — Telephone Encounter (Signed)
attempted to contact patient and left message on machine to advise patient that refill has been sent to the pharmacy.

## 2019-09-28 ENCOUNTER — Other Ambulatory Visit: Payer: Self-pay

## 2019-09-28 DIAGNOSIS — M47814 Spondylosis without myelopathy or radiculopathy, thoracic region: Secondary | ICD-10-CM | POA: Diagnosis not present

## 2019-09-28 DIAGNOSIS — E1122 Type 2 diabetes mellitus with diabetic chronic kidney disease: Secondary | ICD-10-CM

## 2019-09-28 DIAGNOSIS — N183 Chronic kidney disease, stage 3 unspecified: Secondary | ICD-10-CM

## 2019-09-28 DIAGNOSIS — M9905 Segmental and somatic dysfunction of pelvic region: Secondary | ICD-10-CM | POA: Diagnosis not present

## 2019-09-28 DIAGNOSIS — M9903 Segmental and somatic dysfunction of lumbar region: Secondary | ICD-10-CM | POA: Diagnosis not present

## 2019-09-28 DIAGNOSIS — M5137 Other intervertebral disc degeneration, lumbosacral region: Secondary | ICD-10-CM | POA: Diagnosis not present

## 2019-09-28 DIAGNOSIS — M5136 Other intervertebral disc degeneration, lumbar region: Secondary | ICD-10-CM | POA: Diagnosis not present

## 2019-09-28 DIAGNOSIS — M9902 Segmental and somatic dysfunction of thoracic region: Secondary | ICD-10-CM | POA: Diagnosis not present

## 2019-09-28 MED ORDER — ACCU-CHEK SOFTCLIX LANCETS MISC: 2 refills | Status: DC

## 2019-09-28 MED ORDER — ACCU-CHEK AVIVA PLUS W/DEVICE KIT: PACK | 1 refills | Status: DC

## 2019-09-28 MED ORDER — ALCOHOL PADS 70 % PADS: MEDICATED_PAD | 2 refills | Status: DC

## 2019-09-29 HISTORY — PX: OTHER SURGICAL HISTORY: SHX169

## 2019-10-02 DIAGNOSIS — M5137 Other intervertebral disc degeneration, lumbosacral region: Secondary | ICD-10-CM | POA: Diagnosis not present

## 2019-10-02 DIAGNOSIS — M9902 Segmental and somatic dysfunction of thoracic region: Secondary | ICD-10-CM | POA: Diagnosis not present

## 2019-10-02 DIAGNOSIS — M9905 Segmental and somatic dysfunction of pelvic region: Secondary | ICD-10-CM | POA: Diagnosis not present

## 2019-10-02 DIAGNOSIS — M5136 Other intervertebral disc degeneration, lumbar region: Secondary | ICD-10-CM | POA: Diagnosis not present

## 2019-10-02 DIAGNOSIS — M9903 Segmental and somatic dysfunction of lumbar region: Secondary | ICD-10-CM | POA: Diagnosis not present

## 2019-10-02 DIAGNOSIS — M47814 Spondylosis without myelopathy or radiculopathy, thoracic region: Secondary | ICD-10-CM | POA: Diagnosis not present

## 2019-10-09 DIAGNOSIS — M9902 Segmental and somatic dysfunction of thoracic region: Secondary | ICD-10-CM | POA: Diagnosis not present

## 2019-10-09 DIAGNOSIS — M47814 Spondylosis without myelopathy or radiculopathy, thoracic region: Secondary | ICD-10-CM | POA: Diagnosis not present

## 2019-10-09 DIAGNOSIS — M9905 Segmental and somatic dysfunction of pelvic region: Secondary | ICD-10-CM | POA: Diagnosis not present

## 2019-10-09 DIAGNOSIS — M5137 Other intervertebral disc degeneration, lumbosacral region: Secondary | ICD-10-CM | POA: Diagnosis not present

## 2019-10-09 DIAGNOSIS — M5136 Other intervertebral disc degeneration, lumbar region: Secondary | ICD-10-CM | POA: Diagnosis not present

## 2019-10-09 DIAGNOSIS — M9903 Segmental and somatic dysfunction of lumbar region: Secondary | ICD-10-CM | POA: Diagnosis not present

## 2019-10-10 ENCOUNTER — Telehealth: Payer: Self-pay

## 2019-10-11 ENCOUNTER — Telehealth: Payer: Self-pay | Admitting: Rheumatology

## 2019-10-11 NOTE — Telephone Encounter (Signed)
Patient called requesting a return call regarding scheduling her next Prolia injection.  Patient states she had her last injection on 05/26/19 and is not sure if Dr. Estanislado Pandy wants her to schedule her follow-up appointment before or after her Prolia injection.

## 2019-10-12 NOTE — Telephone Encounter (Signed)
Spoke with patient and she is now scheduled for 11/29/2019 for an office visit and prolia injection (to limit patient having to come for 2 separate appointments during [place]). Patient is aware that labs are needed 10 days prior to prolia injection and 10 days afterwards.

## 2019-10-16 DIAGNOSIS — M5137 Other intervertebral disc degeneration, lumbosacral region: Secondary | ICD-10-CM | POA: Diagnosis not present

## 2019-10-16 DIAGNOSIS — M47814 Spondylosis without myelopathy or radiculopathy, thoracic region: Secondary | ICD-10-CM | POA: Diagnosis not present

## 2019-10-16 DIAGNOSIS — M9902 Segmental and somatic dysfunction of thoracic region: Secondary | ICD-10-CM | POA: Diagnosis not present

## 2019-10-16 DIAGNOSIS — M9903 Segmental and somatic dysfunction of lumbar region: Secondary | ICD-10-CM | POA: Diagnosis not present

## 2019-10-16 DIAGNOSIS — M9905 Segmental and somatic dysfunction of pelvic region: Secondary | ICD-10-CM | POA: Diagnosis not present

## 2019-10-16 DIAGNOSIS — M5136 Other intervertebral disc degeneration, lumbar region: Secondary | ICD-10-CM | POA: Diagnosis not present

## 2019-10-17 ENCOUNTER — Other Ambulatory Visit: Payer: Self-pay | Admitting: Internal Medicine

## 2019-10-17 ENCOUNTER — Ambulatory Visit: Payer: Medicare HMO | Admitting: Rheumatology

## 2019-10-31 ENCOUNTER — Other Ambulatory Visit: Payer: Self-pay

## 2019-10-31 ENCOUNTER — Telehealth: Payer: Self-pay

## 2019-10-31 ENCOUNTER — Encounter: Payer: Self-pay | Admitting: Internal Medicine

## 2019-10-31 ENCOUNTER — Ambulatory Visit (INDEPENDENT_AMBULATORY_CARE_PROVIDER_SITE_OTHER): Payer: Medicare HMO | Admitting: Internal Medicine

## 2019-10-31 ENCOUNTER — Other Ambulatory Visit: Payer: Self-pay | Admitting: Internal Medicine

## 2019-10-31 VITALS — BP 144/90 | HR 84 | Temp 98.5°F | Ht <= 58 in | Wt 158.6 lb

## 2019-10-31 DIAGNOSIS — E039 Hypothyroidism, unspecified: Secondary | ICD-10-CM | POA: Diagnosis not present

## 2019-10-31 DIAGNOSIS — E6609 Other obesity due to excess calories: Secondary | ICD-10-CM | POA: Diagnosis not present

## 2019-10-31 DIAGNOSIS — M545 Low back pain, unspecified: Secondary | ICD-10-CM

## 2019-10-31 DIAGNOSIS — E1122 Type 2 diabetes mellitus with diabetic chronic kidney disease: Secondary | ICD-10-CM

## 2019-10-31 DIAGNOSIS — M0579 Rheumatoid arthritis with rheumatoid factor of multiple sites without organ or systems involvement: Secondary | ICD-10-CM

## 2019-10-31 DIAGNOSIS — H02849 Edema of unspecified eye, unspecified eyelid: Secondary | ICD-10-CM

## 2019-10-31 DIAGNOSIS — D649 Anemia, unspecified: Secondary | ICD-10-CM | POA: Diagnosis not present

## 2019-10-31 DIAGNOSIS — Z6833 Body mass index (BMI) 33.0-33.9, adult: Secondary | ICD-10-CM | POA: Diagnosis not present

## 2019-10-31 DIAGNOSIS — Z0000 Encounter for general adult medical examination without abnormal findings: Secondary | ICD-10-CM | POA: Diagnosis not present

## 2019-10-31 DIAGNOSIS — I129 Hypertensive chronic kidney disease with stage 1 through stage 4 chronic kidney disease, or unspecified chronic kidney disease: Secondary | ICD-10-CM | POA: Diagnosis not present

## 2019-10-31 DIAGNOSIS — M546 Pain in thoracic spine: Secondary | ICD-10-CM

## 2019-10-31 DIAGNOSIS — Z6835 Body mass index (BMI) 35.0-35.9, adult: Secondary | ICD-10-CM | POA: Insufficient documentation

## 2019-10-31 DIAGNOSIS — N183 Chronic kidney disease, stage 3 unspecified: Secondary | ICD-10-CM | POA: Diagnosis not present

## 2019-10-31 LAB — POCT URINALYSIS DIPSTICK
Bilirubin, UA: NEGATIVE
Blood, UA: NEGATIVE
Glucose, UA: NEGATIVE
Ketones, UA: NEGATIVE
Leukocytes, UA: NEGATIVE
Nitrite, UA: NEGATIVE
Protein, UA: NEGATIVE
Spec Grav, UA: 1.02 (ref 1.010–1.025)
Urobilinogen, UA: 0.2 E.U./dL
pH, UA: 5.5 (ref 5.0–8.0)

## 2019-10-31 LAB — POCT UA - MICROALBUMIN
Creatinine, POC: 200 mg/dL
Microalbumin Ur, POC: 80 mg/L

## 2019-10-31 NOTE — Patient Instructions (Signed)
Health Maintenance, Female Adopting a healthy lifestyle and getting preventive care are important in promoting health and wellness. Ask your health care provider about:  The right schedule for you to have regular tests and exams.  Things you can do on your own to prevent diseases and keep yourself healthy. What should I know about diet, weight, and exercise? Eat a healthy diet   Eat a diet that includes plenty of vegetables, fruits, low-fat dairy products, and lean protein.  Do not eat a lot of foods that are high in solid fats, added sugars, or sodium. Maintain a healthy weight Body mass index (BMI) is used to identify weight problems. It estimates body fat based on height and weight. Your health care provider can help determine your BMI and help you achieve or maintain a healthy weight. Get regular exercise Get regular exercise. This is one of the most important things you can do for your health. Most adults should:  Exercise for at least 150 minutes each week. The exercise should increase your heart rate and make you sweat (moderate-intensity exercise).  Do strengthening exercises at least twice a week. This is in addition to the moderate-intensity exercise.  Spend less time sitting. Even light physical activity can be beneficial. Watch cholesterol and blood lipids Have your blood tested for lipids and cholesterol at 75 years of age, then have this test every 5 years. Have your cholesterol levels checked more often if:  Your lipid or cholesterol levels are high.  You are older than 75 years of age.  You are at high risk for heart disease. What should I know about cancer screening? Depending on your health history and family history, you may need to have cancer screening at various ages. This may include screening for:  Breast cancer.  Cervical cancer.  Colorectal cancer.  Skin cancer.  Lung cancer. What should I know about heart disease, diabetes, and high blood  pressure? Blood pressure and heart disease  High blood pressure causes heart disease and increases the risk of stroke. This is more likely to develop in people who have high blood pressure readings, are of African descent, or are overweight.  Have your blood pressure checked: ? Every 3-5 years if you are 18-39 years of age. ? Every year if you are 40 years old or older. Diabetes Have regular diabetes screenings. This checks your fasting blood sugar level. Have the screening done:  Once every three years after age 40 if you are at a normal weight and have a low risk for diabetes.  More often and at a younger age if you are overweight or have a high risk for diabetes. What should I know about preventing infection? Hepatitis B If you have a higher risk for hepatitis B, you should be screened for this virus. Talk with your health care provider to find out if you are at risk for hepatitis B infection. Hepatitis C Testing is recommended for:  Everyone born from 1945 through 1965.  Anyone with known risk factors for hepatitis C. Sexually transmitted infections (STIs)  Get screened for STIs, including gonorrhea and chlamydia, if: ? You are sexually active and are younger than 75 years of age. ? You are older than 75 years of age and your health care provider tells you that you are at risk for this type of infection. ? Your sexual activity has changed since you were last screened, and you are at increased risk for chlamydia or gonorrhea. Ask your health care provider if   you are at risk.  Ask your health care provider about whether you are at high risk for HIV. Your health care provider may recommend a prescription medicine to help prevent HIV infection. If you choose to take medicine to prevent HIV, you should first get tested for HIV. You should then be tested every 3 months for as long as you are taking the medicine. Pregnancy  If you are about to stop having your period (premenopausal) and  you may become pregnant, seek counseling before you get pregnant.  Take 400 to 800 micrograms (mcg) of folic acid every day if you become pregnant.  Ask for birth control (contraception) if you want to prevent pregnancy. Osteoporosis and menopause Osteoporosis is a disease in which the bones lose minerals and strength with aging. This can result in bone fractures. If you are 65 years old or older, or if you are at risk for osteoporosis and fractures, ask your health care provider if you should:  Be screened for bone loss.  Take a calcium or vitamin D supplement to lower your risk of fractures.  Be given hormone replacement therapy (HRT) to treat symptoms of menopause. Follow these instructions at home: Lifestyle  Do not use any products that contain nicotine or tobacco, such as cigarettes, e-cigarettes, and chewing tobacco. If you need help quitting, ask your health care provider.  Do not use street drugs.  Do not share needles.  Ask your health care provider for help if you need support or information about quitting drugs. Alcohol use  Do not drink alcohol if: ? Your health care provider tells you not to drink. ? You are pregnant, may be pregnant, or are planning to become pregnant.  If you drink alcohol: ? Limit how much you use to 0-1 drink a day. ? Limit intake if you are breastfeeding.  Be aware of how much alcohol is in your drink. In the [place] one drink equals one 12 oz bottle of beer (355 mL), one 5 oz glass of wine (148 mL), or one 1 oz glass of hard liquor (44 mL). General instructions  Schedule regular health, dental, and eye exams.  Stay current with your vaccines.  Tell your health care provider if: ? You often feel depressed. ? You have ever been abused or do not feel safe at home. Summary  Adopting a healthy lifestyle and getting preventive care are important in promoting health and wellness.  Follow your health care provider's instructions about healthy  diet, exercising, and getting tested or screened for diseases.  Follow your health care provider's instructions on monitoring your cholesterol and blood pressure. This information is not intended to replace advice given to you by your health care provider. Make sure you discuss any questions you have with your health care provider. Document Revised: 09/07/2018 Document Reviewed: 09/07/2018 Elsevier Patient Education  2020 [employer]  

## 2019-10-31 NOTE — Telephone Encounter (Signed)
The pt left a message that she checked her blood pressure at noon and that she got 137/72 with a pulse of 76.

## 2019-10-31 NOTE — Progress Notes (Signed)
This visit occurred during the SARS-CoV-2 public health emergency.  Safety protocols were in place, including screening questions prior to the visit, additional usage of staff PPE, and extensive cleaning of exam room while observing appropriate contact time as indicated for disinfecting solutions.  Subjective:     Patient ID: Hannah [name] , female    DOB: 01/14/1946 , 74 y.o.   MRN: [id number]   Chief Complaint  Patient presents with  . Annual Exam  . Diabetes  . Hypertension    HPI  She is here today for a full physical exam. She has several concerns she would like to have addressed today. She reports compliance with meds. Admits she has been exercising more and believes she has lost some weight.   Diabetes She presents for her follow-up diabetic visit. She has type 2 diabetes mellitus. There are no hypoglycemic associated symptoms. Pertinent negatives for hypoglycemia include no headaches. Pertinent negatives for diabetes include no blurred vision and no chest pain. There are no hypoglycemic complications. Diabetic complications include nephropathy. Risk factors for coronary artery disease include diabetes mellitus, dyslipidemia, hypertension, post-menopausal and sedentary lifestyle. She is following a diabetic diet. She participates in exercise three times a week. An ACE inhibitor/angiotensin II receptor blocker is being taken.  Hypertension This is a chronic problem. The current episode started more than 1 year ago. The problem has been gradually improving since onset. The problem is controlled. Pertinent negatives include no blurred vision, chest pain or headaches. Risk factors for coronary artery disease include diabetes mellitus, obesity and post-menopausal state. Past treatments include angiotensin blockers. The current treatment provides moderate improvement. Hypertensive end-organ damage includes kidney disease.     Past Medical History:  Diagnosis Date  . Arthritis   .  Asthma   . Diabetes mellitus without complication ([place])   . Gout 12/17/2014   patient reported  . Hyperlipidemia   . Hypertension      Family History  Problem Relation Age of Onset  . Heart disease Father   . Diabetes Father   . Hypertension Mother   . Hypertension Sister   . Hypertension Sister   . Leukemia Sister      Current Outpatient Medications:  .  Accu-Chek Softclix Lancets lancets, Use as directed to check blood sugars 1 time per day dx: e11.22, Disp: 150 each, Rfl: 2 .  albuterol (PROVENTIL HFA;VENTOLIN HFA) 108 (90 Base) MCG/ACT inhaler, Inhale 2 puffs into the lungs every 6 (six) hours as needed for wheezing or shortness of breath., Disp: 1 Inhaler, Rfl: 2 .  Alcohol Swabs (ALCOHOL PADS) 70 % PADS, Use as directed to check blood sugars 1 time per day dx: e11.22, Disp: 150 each, Rfl: 2 .  allopurinol (ZYLOPRIM) 100 MG tablet, TAKE 1 TABLET BY MOUTH EVERY DAY FOR GOUT, Disp: 90 tablet, Rfl: 1 .  amLODipine (NORVASC) 5 MG tablet, Take 1 tablet (5 mg total) by mouth daily. (Patient taking differently: Take 5 mg by mouth daily. 1/2 tab in the am and 1 tab in the pm), Disp: 90 tablet, Rfl: 2 .  aspirin EC 81 MG tablet, Take 81 mg by mouth daily., Disp: , Rfl:  .  atorvastatin (LIPITOR) 80 MG tablet, TAKE 1 TABLET BY MOUTH EVERY DAY, Disp: 90 tablet, Rfl: 1 .  Blood Glucose Monitoring Suppl (ACCU-CHEK AVIVA PLUS) w/Device KIT, Use as directed to check blood sugars 1 time per day dx: e11.22, Disp: 1 kit, Rfl: 1 .  budesonide-formoterol (SYMBICORT) 160-4.5 MCG/ACT inhaler, INHALE 2  PUFFS BY MOUTH TWICE DAILY IN THE MORNING AND EVENING, Disp: 10.2 g, Rfl: 2 .  Calcium Carb-Cholecalciferol (CALCIUM 600+D) 600-800 MG-UNIT TABS, Take by mouth 2 (two) times daily. , Disp: , Rfl:  .  Cholecalciferol (VITAMIN D PO), Take 1,000 Units by mouth daily. , Disp: , Rfl:  .  denosumab (PROLIA) 60 MG/ML SOSY injection, Inject 60 mg into the skin every 6 (six) months., Disp: 1 mL, Rfl: 0 .  glucose  blood (ACCU-CHEK AVIVA PLUS) test strip, Use as directed to check blood sugars 1 time per day dx: e11.22, Disp: 150 each, Rfl: 2 .  hydroxychloroquine (PLAQUENIL) 200 MG tablet, TAKE 1 TABLET BY MOUTH DAILY FOR RHEUMATOID ARTHRITIS, Disp: 90 tablet, Rfl: 0 .  Hypromellose (ARTIFICIAL TEARS OP), Apply to eye as needed., Disp: , Rfl:  .  irbesartan (AVAPRO) 300 MG tablet, Take 1 tablet (300 mg total) by mouth daily., Disp: 90 tablet, Rfl: 2 .  metFORMIN (GLUCOPHAGE) 500 MG tablet, TAKE 1 TABLET BY MOUTH TWICE DAILY WITH THE MORNING AND EVENING MEAL (Patient taking differently: Take 500 mg by mouth daily. ), Disp: 180 tablet, Rfl: 0 .  SYNTHROID 88 MCG tablet, TAKE 1 TABLET BY MOUTH EVERY DAY MONDAY TO SATURDAY AND OFF SUNDAYS, Disp: 90 tablet, Rfl: 0   Allergies  Allergen Reactions  . Shellfish Allergy Anaphylaxis and Hives      The patient states she uses post menopausal status for birth control. Last LMP was No LMP recorded. Patient is postmenopausal.. Negative for Dysmenorrhea Negative for: breast discharge, breast lump(s), breast pain and breast self exam. Associated symptoms include abnormal vaginal bleeding. Pertinent negatives include abnormal bleeding (hematology), anxiety, decreased libido, depression, difficulty falling sleep, dyspareunia, history of infertility, nocturia, sexual dysfunction, sleep disturbances, urinary incontinence, urinary urgency, vaginal discharge and vaginal itching. Diet regular.The patient states her exercise level is    . The patient's tobacco use is:  Social History   Tobacco Use  Smoking Status Never Smoker  Smokeless Tobacco Never Used  . She has been exposed to passive smoke. The patient's alcohol use is:  Social History   Substance and Sexual Activity  Alcohol Use Yes   Comment: rarely     Review of Systems  Constitutional: Negative.   HENT: Negative.        C/o eyelid swelling. Not sure what is triggering her sx. Feels she sleeps well at night.    Eyes: Negative.  Negative for blurred vision.  Respiratory: Negative.   Cardiovascular: Negative.  Negative for chest pain.  Endocrine: Negative.   Genitourinary: Negative.   Musculoskeletal: Positive for back pain.       C/o midback pain. Described as dull, throbbing. Denies fall/trauma. Denies UE/LE weakness/paresthesias.   Skin: Negative.   Allergic/Immunologic: Negative.   Neurological: Negative.  Negative for headaches.  Hematological: Negative.   Psychiatric/Behavioral: Negative.      Today's Vitals   10/31/19 0925  BP: (!) 144/90  Pulse: 84  Temp: 98.5 F (36.9 C)  TempSrc: Oral  Weight: 158 lb 9.6 oz (71.9 kg)  Height: 4' 9.4" (1.458 m)  PainSc: 5   PainLoc: Generalized   Body mass index is 33.84 kg/m.   Objective:  Physical Exam Vitals and nursing note reviewed.  Constitutional:      Appearance: Normal appearance. She is obese.  HENT:     Head: Normocephalic and atraumatic.     Right Ear: Tympanic membrane, ear canal and external ear normal.     Left Ear: Tympanic  membrane, ear canal and external ear normal.     Nose:     Comments: Deferred, masked    Mouth/Throat:     Comments: Deferred, masked Eyes:     Extraocular Movements: Extraocular movements intact.     Conjunctiva/sclera: Conjunctivae normal.     Pupils: Pupils are equal, round, and reactive to light.     Comments: Bilateral lower lid swelling  Cardiovascular:     Rate and Rhythm: Normal rate and regular rhythm.     Pulses: Normal pulses.     Heart sounds: Normal heart sounds.  Pulmonary:     Effort: Pulmonary effort is normal.     Breath sounds: Normal breath sounds.  Chest:     Breasts: Tanner Score is 5.        Right: Normal.        Left: Normal.  Abdominal:     General: Abdomen is flat. Bowel sounds are normal.     Palpations: Abdomen is soft.  Genitourinary:    Comments: deferred Musculoskeletal:        General: Normal range of motion.     Cervical back: Normal range of motion  and neck supple.  Skin:    General: Skin is warm and dry.     Comments: Scattered seborrheic keratoses  Neurological:     General: No focal deficit present.     Mental Status: She is alert and oriented to person, place, and time.  Psychiatric:        Mood and Affect: Mood normal.        Behavior: Behavior normal.         Assessment And Plan:   1. Routine general medical examination at health care facility  A full exam was performed.  Importance of monthly self breast exams was discussed with the patient. PATIENT IS ADVISED TO GET 30-45 MINUTES REGULAR EXERCISE NO LESS THAN FOUR TO FIVE DAYS PER WEEK - BOTH WEIGHTBEARING EXERCISES AND AEROBIC ARE RECOMMENDED.  SHE IS ADVISED TO FOLLOW A HEALTHY DIET WITH AT LEAST SIX FRUITS/VEGGIES PER DAY, DECREASE INTAKE OF RED MEAT, AND TO INCREASE FISH INTAKE TO TWO DAYS PER WEEK.  MEATS/FISH SHOULD NOT BE FRIED, BAKED OR BROILED IS PREFERABLE.  I SUGGEST WEARING SPF 50 SUNSCREEN ON EXPOSED PARTS AND ESPECIALLY WHEN IN THE DIRECT SUNLIGHT FOR AN EXTENDED PERIOD OF TIME.  PLEASE AVOID FAST FOOD RESTAURANTS AND INCREASE YOUR WATER INTAKE.  - CMP14+EGFR - CBC - Lipid panel - Hemoglobin A1c  2. Diabetes mellitus with stage 3 chronic kidney disease (HCC)  Diabetic foot exam was performed. I DISCUSSED WITH THE PATIENT AT LENGTH REGARDING THE GOALS OF GLYCEMIC CONTROL AND POSSIBLE LONG-TERM COMPLICATIONS.  I  ALSO STRESSED THE IMPORTANCE OF COMPLIANCE WITH HOME GLUCOSE MONITORING, DIETARY RESTRICTIONS INCLUDING AVOIDANCE OF SUGARY DRINKS/PROCESSED FOODS,  ALONG WITH REGULAR EXERCISE.  I  ALSO STRESSED THE IMPORTANCE OF ANNUAL EYE EXAMS, SELF FOOT CARE AND COMPLIANCE WITH OFFICE VISITS.  - POCT Urinalysis Dipstick (81002) - POCT UA - Microalbumin  3. Hypertensive nephropathy  Chronic, fair control. She will continue with current meds for now. She is encouraged to avoid adding salt to her foods. EKG performed, no new changes noted. She is encouraged to  aim for at least 30 minutes of exercise five days per week.   - EKG 12-Lead  4. Rheumatoid arthritis involving multiple sites with positive rheumatoid factor (HCC)  Chronic, yet stable. She is also followed by Rheumatology.   5. Thoracolumbar back pain  Chronic.  Patient advised to perform stretches daily and to take magnesium nightly.   6. Normocytic anemia  Chronic, yet stable. Likely related to renal function. Of note, she also has MGUS. I will check CBC today.   7. Eyelid gland swelling, unspecified laterality  She is advised to apply a small amount of Preparation H on her lower eyelids 2-3 times per week.   8. Class 1 obesity due to excess calories with serious comorbidity and body mass index (BMI) of 33.0 to 33.9 in adult  She is encouraged to strive for BMI less than 30 to decrease cardiac risk. She was congratulated on her 3 pound weight loss since December.   Wt Readings from Last 3 Encounters:  10/31/19 158 lb 9.6 oz (71.9 kg)  09/05/19 161 lb (73 kg)  07/19/19 159 lb 6.4 oz (72.3 kg)        [name], MD    THE PATIENT IS ENCOURAGED TO PRACTICE SOCIAL DISTANCING DUE TO THE COVID-19 PANDEMIC.

## 2019-11-01 LAB — LIPID PANEL
Chol/HDL Ratio: 2 ratio (ref 0.0–4.4)
Cholesterol, Total: 134 mg/dL (ref 100–199)
HDL: 67 mg/dL (ref >39)
LDL Chol Calc (NIH): 53 mg/dL (ref 0–99)
Triglycerides: 70 mg/dL (ref 0–149)
VLDL Cholesterol Cal: 14 mg/dL (ref 5–40)

## 2019-11-01 LAB — CMP14+EGFR
ALT: 20 IU/L (ref 0–32)
AST: 23 IU/L (ref 0–40)
Albumin/Globulin Ratio: 2 (ref 1.2–2.2)
Albumin: 4.5 g/dL (ref 3.7–4.7)
Alkaline Phosphatase: 57 IU/L (ref 39–117)
BUN/Creatinine Ratio: 14 (ref 12–28)
BUN: 15 mg/dL (ref 8–27)
Bilirubin Total: 0.3 mg/dL (ref 0.0–1.2)
CO2: 22 mmol/L (ref 20–29)
Calcium: 9.7 mg/dL (ref 8.7–10.3)
Chloride: 102 mmol/L (ref 96–106)
Creatinine, Ser: 1.05 mg/dL — ABNORMAL HIGH (ref 0.57–1.00)
GFR calc Af Amer: 60 mL/min/{1.73_m2} (ref >59)
GFR calc non Af Amer: 52 mL/min/{1.73_m2} — ABNORMAL LOW (ref >59)
Globulin, Total: 2.2 g/dL (ref 1.5–4.5)
Glucose: 90 mg/dL (ref 65–99)
Potassium: 4.2 mmol/L (ref 3.5–5.2)
Sodium: 141 mmol/L (ref 134–144)
Total Protein: 6.7 g/dL (ref 6.0–8.5)

## 2019-11-01 LAB — CBC
Hematocrit: 35.5 % (ref 34.0–46.6)
Hemoglobin: 11.7 g/dL (ref 11.1–15.9)
MCH: 32.3 pg (ref 26.6–33.0)
MCHC: 33 g/dL (ref 31.5–35.7)
MCV: 98 fL — ABNORMAL HIGH (ref 79–97)
Platelets: 154 10*3/uL (ref 150–450)
RBC: 3.62 x10E6/uL — ABNORMAL LOW (ref 3.77–5.28)
RDW: 12.1 % (ref 11.7–15.4)
WBC: 6.1 10*3/uL (ref 3.4–10.8)

## 2019-11-01 LAB — HEMOGLOBIN A1C
Est. average glucose Bld gHb Est-mCnc: 114 mg/dL
Hgb A1c MFr Bld: 5.6 % (ref 4.8–5.6)

## 2019-11-01 LAB — TSH: TSH: 0.498 u[IU]/mL (ref 0.450–4.500)

## 2019-11-01 LAB — T4, FREE: Free T4: 1.53 ng/dL (ref 0.82–1.77)

## 2019-11-02 DIAGNOSIS — M5137 Other intervertebral disc degeneration, lumbosacral region: Secondary | ICD-10-CM | POA: Diagnosis not present

## 2019-11-02 DIAGNOSIS — M9902 Segmental and somatic dysfunction of thoracic region: Secondary | ICD-10-CM | POA: Diagnosis not present

## 2019-11-02 DIAGNOSIS — M5136 Other intervertebral disc degeneration, lumbar region: Secondary | ICD-10-CM | POA: Diagnosis not present

## 2019-11-02 DIAGNOSIS — M47814 Spondylosis without myelopathy or radiculopathy, thoracic region: Secondary | ICD-10-CM | POA: Diagnosis not present

## 2019-11-02 DIAGNOSIS — M9905 Segmental and somatic dysfunction of pelvic region: Secondary | ICD-10-CM | POA: Diagnosis not present

## 2019-11-02 DIAGNOSIS — M9903 Segmental and somatic dysfunction of lumbar region: Secondary | ICD-10-CM | POA: Diagnosis not present

## 2019-11-07 ENCOUNTER — Ambulatory Visit (INDEPENDENT_AMBULATORY_CARE_PROVIDER_SITE_OTHER): Payer: Medicare HMO

## 2019-11-07 ENCOUNTER — Other Ambulatory Visit: Payer: Self-pay

## 2019-11-07 ENCOUNTER — Telehealth: Payer: Self-pay

## 2019-11-07 DIAGNOSIS — E1122 Type 2 diabetes mellitus with diabetic chronic kidney disease: Secondary | ICD-10-CM

## 2019-11-07 DIAGNOSIS — M0579 Rheumatoid arthritis with rheumatoid factor of multiple sites without organ or systems involvement: Secondary | ICD-10-CM

## 2019-11-07 DIAGNOSIS — I129 Hypertensive chronic kidney disease with stage 1 through stage 4 chronic kidney disease, or unspecified chronic kidney disease: Secondary | ICD-10-CM

## 2019-11-07 DIAGNOSIS — N183 Chronic kidney disease, stage 3 unspecified: Secondary | ICD-10-CM

## 2019-11-08 NOTE — Chronic Care Management (AMB) (Signed)
Chronic Care Management   Initial Visit Note  11/07/2019 Name: Hannah [name] MRN: [id number] DOB: June 28, 1946  Referred by: Glendale Chard, MD Reason for referral : Chronic Care Management (#1 Attempt New CCM RNCM Telephone Outreach )   Hannah [name] is a 74 y.o. year old female who is a primary care patient of Glendale Chard, MD. The CCM team was consulted for assistance with chronic disease management and care coordination needs related to DMII, CKD Stage III and RA; Hypertensive Nephropathy  Review of patient status, including review of consultants reports, relevant laboratory and other test results, and collaboration with appropriate care team members and the patient's provider was performed as part of comprehensive patient evaluation and provision of chronic care management services.    SDOH (Social Determinants of Health) screening performed today: None. See Care Plan for related entries.   Initial CCM RN CM outbound call placed to patient to assess for CCM needs, a care plan was established.   Medications: Outpatient Encounter Medications as of 11/07/2019  Medication Sig Note  . Accu-Chek Softclix Lancets lancets Use as directed to check blood sugars 1 time per day dx: e11.22   . albuterol (PROVENTIL HFA;VENTOLIN HFA) 108 (90 Base) MCG/ACT inhaler Inhale 2 puffs into the lungs every 6 (six) hours as needed for wheezing or shortness of breath.   . Alcohol Swabs (ALCOHOL PADS) 70 % PADS Use as directed to check blood sugars 1 time per day dx: e11.22   . allopurinol (ZYLOPRIM) 100 MG tablet TAKE 1 TABLET BY MOUTH EVERY DAY FOR GOUT   . amLODipine (NORVASC) 5 MG tablet Take 1 tablet (5 mg total) by mouth daily. (Patient taking differently: Take 5 mg by mouth daily. 1/2 tab in the am and 1 tab in the pm) 06/28/2019: Patient reports taking 2.5 mg in AM and 5 mg in PM per PCP directions  . aspirin EC 81 MG tablet Take 81 mg by mouth daily.   [name] atorvastatin (LIPITOR) 80 MG tablet  TAKE 1 TABLET BY MOUTH EVERY DAY   . Blood Glucose Monitoring Suppl (ACCU-CHEK AVIVA PLUS) w/Device KIT Use as directed to check blood sugars 1 time per day dx: e11.22   . budesonide-formoterol (SYMBICORT) 160-4.5 MCG/ACT inhaler INHALE 2 PUFFS BY MOUTH TWICE DAILY IN THE MORNING AND EVENING   . Calcium Carb-Cholecalciferol (CALCIUM 600+D) 600-800 MG-UNIT TABS Take by mouth 2 (two) times daily.    . Cholecalciferol (VITAMIN D PO) Take 1,000 Units by mouth daily.    [name] denosumab (PROLIA) 60 MG/ML SOSY injection Inject 60 mg into the skin every 6 (six) months.   [name] glucose blood (ACCU-CHEK AVIVA PLUS) test strip Use as directed to check blood sugars 1 time per day dx: e11.22   . hydroxychloroquine (PLAQUENIL) 200 MG tablet TAKE 1 TABLET BY MOUTH DAILY FOR RHEUMATOID ARTHRITIS   . Hypromellose (ARTIFICIAL TEARS OP) Apply to eye as needed.   . irbesartan (AVAPRO) 300 MG tablet Take 1 tablet (300 mg total) by mouth daily.   . metFORMIN (GLUCOPHAGE) 500 MG tablet TAKE 1 TABLET BY MOUTH TWICE DAILY WITH THE MORNING AND EVENING MEAL   . SYNTHROID 88 MCG tablet TAKE 1 TABLET BY MOUTH EVERY DAY MONDAY TO SATURDAY AND OFF SUNDAYS    No facility-administered encounter medications on file as of 11/07/2019.     Objective:  Lab Results  Component Value Date   HGBA1C 5.6 10/31/2019   HGBA1C 5.6 06/15/2019   HGBA1C 5.7 (H) 02/23/2019   Lab  Results  Component Value Date   MICROALBUR 80 10/31/2019   LDLCALC 53 10/31/2019   CREATININE 1.05 (H) 10/31/2019   BP Readings from Last 3 Encounters:  10/31/19 (!) 144/90  09/05/19 136/70  07/19/19 (!) 148/90    Goals Addressed            This Visit's Progress     Patient Stated   . "I want to keep my RA under good control" (pt-stated)       Current Barriers:  [name] Knowledge Deficits related to disease process and Self Health management of RA . Chronic Disease Management support and education needs related to RA, DM, Hypertensive Neuropathy, CKDIII  Nurse  Case Manager Clinical Goal(s):  [name] Over the next 90 days, patient will work with CCM RN CM to address needs related to disease education and support for RA . Over the next 90 days, the patient will demonstrate ongoing self health care management ability as evidenced by patient will report having no RA exacerbations *  Interventions:  . Evaluation of current treatment plan related to RA and patient's adherence to plan as established by provider. . Provided education to patient re: disease process secondary to having an auto-immune reaction; educated on effects of having increased inflammation to joints and other body systems; education provided with rationale for goal of DMARDs help control this disease process by putting the RA in remission  . Reviewed medications with patient and discussed indication, frequency and dosages of prescribed RA medications; patient denies having financial hardship and or noted SE . Discussed plans with patient for ongoing care management follow up and provided patient with direct contact information for care management team . Provided patient with printed educational materials related to Foods That Help Rheumatoid Arthritis; Rheumatoid Arthritis ([employer])  Patient Self Care Activities:  . Self administers medications as prescribed . Attends all scheduled provider appointments . Calls pharmacy for medication refills . Performs ADL's independently . Performs IADL's independently . Calls provider office for new concerns or questions  Initial goal documentation     . "To maintain my kidney function" (pt-stated)       Current Barriers:  [name] Knowledge Deficits related to disease process and Self Health management of CKD . Chronic Disease Management support and education needs related to CKDIII, Hypertensive Neuropathy, DM  Nurse Case Manager Clinical Goal(s):  [name] Over the next 90 days, patient will work with CCM RN CM to address needs related to  disease education and supprt to improve Self Health management of CKD  Interventions:  . Evaluation of current treatment plan related to CKD and patient's adherence to plan as established by provider. . Reviewed medications with patient and discussed indication, dosage and frequency of prescribed medications; patient denies having financial hardship, denies having any side effects and or adverse events  . Discussed plans with patient for ongoing care management follow up and provided patient with direct contact information for care management team . Provided patient with printed educational materials related to Stages of Chronic Kidney disease  Patient Self Care Activities:  . Self administers medications as prescribed . Attends all scheduled provider appointments . Calls pharmacy for medication refills . Performs ADL's independently . Performs IADL's independently . Calls provider office for new concerns or questions  Initial goal documentation        Plan:   Telephone follow up appointment with care management team member scheduled for:12/12/19  Barb Merino, RN, BSN, CCM Care Management Coordinator [facility]  Direct Phone: [phone]

## 2019-11-08 NOTE — Patient Instructions (Signed)
Visit Information  Goals Addressed      Patient Stated   . "I want to keep my RA under good control" (pt-stated)       Current Barriers:  [name] Knowledge Deficits related to disease process and Self Health management of RA . Chronic Disease Management support and education needs related to RA, DM, Hypertensive Neuropathy, CKDIII  Nurse Case Manager Clinical Goal(s):  [name] Over the next 90 days, patient will work with CCM RN CM to address needs related to disease education and support for RA . Over the next 90 days, the patient will demonstrate ongoing self health care management ability as evidenced by patient will report having no RA exacerbations *  Interventions:  . Evaluation of current treatment plan related to RA and patient's adherence to plan as established by provider. . Provided education to patient re: disease process secondary to having an auto-immune reaction; educated on effects of having increased inflammation to joints and other body systems; education provided with rationale for goal of DMARDs help control this disease process by putting the RA in remission  . Reviewed medications with patient and discussed indication, frequency and dosages of prescribed RA medications; patient denies having financial hardship and or noted SE . Discussed plans with patient for ongoing care management follow up and provided patient with direct contact information for care management team . Provided patient with printed educational materials related to Foods That Help Rheumatoid Arthritis; Rheumatoid Arthritis ([employer])  Patient Self Care Activities:  . Self administers medications as prescribed . Attends all scheduled provider appointments . Calls pharmacy for medication refills . Performs ADL's independently . Performs IADL's independently . Calls provider office for new concerns or questions  Initial goal documentation     . "To maintain my kidney function" (pt-stated)        Current Barriers:  [name] Knowledge Deficits related to disease process and Self Health management of CKD . 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 Evaluation of current treatment plan related to CKD and patient's adherence to plan as established by provider. . Reviewed medications with patient and discussed indication, dosage and frequency of prescribed medications; patient denies having financial hardship, denies having any side effects and or adverse events  . Discussed plans with patient for ongoing care management follow up and provided patient with direct contact information for care management team . Provided patient with printed educational materials related to Stages of Chronic Kidney disease  Patient Self Care Activities:  . Self administers medications as prescribed . Attends all scheduled provider appointments . Calls pharmacy for medication refills . Performs ADL's independently . Performs IADL's independently . Calls provider office for new concerns or questions  Initial goal documentation        The patient verbalized understanding of instructions provided today and declined a print copy of patient instruction materials.   Telephone follow up appointment with care management team member scheduled for:12/12/19  Barb Merino, RN, BSN, CCM Care Management Coordinator [place] [facility]  Direct Phone: [phone]

## 2019-11-09 ENCOUNTER — Other Ambulatory Visit: Payer: Self-pay | Admitting: Physician Assistant

## 2019-11-09 ENCOUNTER — Encounter: Payer: Self-pay | Admitting: Internal Medicine

## 2019-11-09 ENCOUNTER — Ambulatory Visit: Payer: Self-pay

## 2019-11-09 DIAGNOSIS — E1122 Type 2 diabetes mellitus with diabetic chronic kidney disease: Secondary | ICD-10-CM

## 2019-11-09 DIAGNOSIS — I129 Hypertensive chronic kidney disease with stage 1 through stage 4 chronic kidney disease, or unspecified chronic kidney disease: Secondary | ICD-10-CM

## 2019-11-09 DIAGNOSIS — D485 Neoplasm of uncertain behavior of skin: Secondary | ICD-10-CM | POA: Diagnosis not present

## 2019-11-09 DIAGNOSIS — N183 Chronic kidney disease, stage 3 unspecified: Secondary | ICD-10-CM

## 2019-11-09 DIAGNOSIS — M0579 Rheumatoid arthritis with rheumatoid factor of multiple sites without organ or systems involvement: Secondary | ICD-10-CM

## 2019-11-09 DIAGNOSIS — L820 Inflamed seborrheic keratosis: Secondary | ICD-10-CM | POA: Diagnosis not present

## 2019-11-10 NOTE — Patient Instructions (Signed)
Visit Information  Goals Addressed      Patient Stated   . "To maintain my kidney function" (pt-stated)       Current Barriers:  [name] Knowledge Deficits related to disease process and Self Health management of CKD . 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 Evaluation of current treatment plan related to CKD and patient's adherence to plan as established by provider. . Reviewed medications with patient and discussed indication, dosage and frequency of prescribed medications; patient denies having financial hardship, denies having any side effects and or adverse events  . Discussed plans with patient for ongoing care management follow up and provided patient with direct contact information for care management team . Provided patient with printed educational materials related to Stages of Chronic Kidney disease  Patient Self Care Activities:  . Self administers medications as prescribed . Attends all scheduled provider appointments . Calls pharmacy for medication refills . Performs ADL's independently . Performs IADL's independently . Calls provider office for new concerns or questions  Initial goal documentation     . I would like to manage my diabetes (pt-stated)       Current Barriers:  . Diabetes: T2DM; most recent A1c 5.9% on 06/15/19 . Current antihyperglycemic regimen: metformin 500mg  BID . Denies hypoglycemic symptoms; denies hyperglycemic symptoms . Current meal patterns: o Tries to incorporate protein, veggie, small portion carbohydrate o Drinks:water, coffee (avoids sugary drinks) . Current exercise: silver sneakers twice weekly (wearing mask, social distancing) . Current blood glucose readings: FBG  80-90s, no reported Bgs in the 70s or <70.  Patient has started working out twice [employer] [facility], hospital). Counseled on signs and symptoms of low blood sugar (Bg<70) and how to treat.  Encouraged patient to call PCP office if experiencings "lows".  We will make adjustments to metformin as needed. . Cardiovascular risk reduction: o Current hypertensive regimen: irbesartan, amlodipine o Current hyperlipidemia regimen: atorvastatin 80mg  qHS  Pharmacist Clinical Goal(s):  [name] Over the next 90 days, patient will work with PharmD and primary care provider to address needs related to optimized medication management of diabetes  Interventions: . Comprehensive medication review performed, medication list updated in electronic medical record  11/09/19 CCM RNCM Interventions . Placed outbound call to patient to advise patient, per Dr. Baird Cancer, she may decrease her Metformin to 500 mg,1 tablet once daily with breakfast. Reinforced the importance of regular exercise and diet adherence . Advised patient this dosage may be increased back to twice daily if her A1C goes above 6.5, patient veralizes understanding  Patient Self Care Activities:  . Patient will check blood glucose daily (and as needed), document, and provide at future appointments . Patient will focus on medication adherence by continuing to take medication as prescribed . Patient will take medications as prescribed . Patient will contact provider with any episodes of hypoglycemia . Patient will report any questions or concerns to provider   Please see past updates related to this goal by clicking on the "Past Updates" button in the selected goal         The patient verbalized understanding of instructions provided today and declined a print copy of patient instruction materials.   Telephone follow up appointment with care management team member  scheduled for: 12/12/19  Barb Merino, RN, BSN, CCM Care Management Coordinator [facility]  Direct Phone: [phone]

## 2019-11-10 NOTE — Chronic Care Management (AMB) (Signed)
Chronic Care Management   Follow Up Note   11/09/2019 Name: Hannah [name] MRN: [id number] DOB: 06/07/1946  Referred by: Glendale Chard, MD Reason for referral : Chronic Care Management (CCM RNCM Telephone Follow up)   Hannah [name] is a 74 y.o. year old female who is a primary care patient of Glendale Chard, MD. The CCM team was consulted for assistance with chronic disease management and care coordination needs.    Review of patient status, including review of consultants reports, relevant laboratory and other test results, and collaboration with appropriate care team members and the patient's provider was performed as part of comprehensive patient evaluation and provision of chronic care management services.    SDOH (Social Determinants of Health) screening performed today: None. See Care Plan for related entries.   Placed outbound call to patient to follow up on her Metformin dosage.   Outpatient Encounter Medications as of 11/09/2019  Medication Sig Note  . Accu-Chek Softclix Lancets lancets Use as directed to check blood sugars 1 time per day dx: e11.22   . albuterol (PROVENTIL HFA;VENTOLIN HFA) 108 (90 Base) MCG/ACT inhaler Inhale 2 puffs into the lungs every 6 (six) hours as needed for wheezing or shortness of breath.   . Alcohol Swabs (ALCOHOL PADS) 70 % PADS Use as directed to check blood sugars 1 time per day dx: e11.22   . allopurinol (ZYLOPRIM) 100 MG tablet TAKE 1 TABLET BY MOUTH EVERY DAY FOR GOUT   . amLODipine (NORVASC) 5 MG tablet Take 1 tablet (5 mg total) by mouth daily. (Patient taking differently: Take 5 mg by mouth daily. 1/2 tab in the am and 1 tab in the pm) 06/28/2019: Patient reports taking 2.5 mg in AM and 5 mg in PM per PCP directions  . aspirin EC 81 MG tablet Take 81 mg by mouth daily.   [name] atorvastatin (LIPITOR) 80 MG tablet TAKE 1 TABLET BY MOUTH EVERY DAY   . Blood Glucose Monitoring Suppl (ACCU-CHEK AVIVA PLUS) w/Device KIT Use as directed to  check blood sugars 1 time per day dx: e11.22   . budesonide-formoterol (SYMBICORT) 160-4.5 MCG/ACT inhaler INHALE 2 PUFFS BY MOUTH TWICE DAILY IN THE MORNING AND EVENING   . Calcium Carb-Cholecalciferol (CALCIUM 600+D) 600-800 MG-UNIT TABS Take by mouth 2 (two) times daily.    . Cholecalciferol (VITAMIN D PO) Take 1,000 Units by mouth daily.    [name] denosumab (PROLIA) 60 MG/ML SOSY injection Inject 60 mg into the skin every 6 (six) months.   [name] glucose blood (ACCU-CHEK AVIVA PLUS) test strip Use as directed to check blood sugars 1 time per day dx: e11.22   . hydroxychloroquine (PLAQUENIL) 200 MG tablet TAKE 1 TABLET BY MOUTH DAILY FOR RHEUMATOID ARTHRITIS   . Hypromellose (ARTIFICIAL TEARS OP) Apply to eye as needed.   . irbesartan (AVAPRO) 300 MG tablet Take 1 tablet (300 mg total) by mouth daily.   . metFORMIN (GLUCOPHAGE) 500 MG tablet TAKE 1 TABLET BY MOUTH TWICE DAILY WITH THE MORNING AND EVENING MEAL (Patient taking differently: Take 500 mg by mouth daily with breakfast. 11/09/19 Per Dr. Glendale Chard, patient may decrease Metformin to 1 tablet daily with breakfast)   . SYNTHROID 88 MCG tablet TAKE 1 TABLET BY MOUTH EVERY DAY MONDAY TO SATURDAY AND OFF SUNDAYS    No facility-administered encounter medications on file as of 11/09/2019.     Goals Addressed      Patient Stated   . "To maintain my kidney function" (pt-stated)  Current Barriers:  [name] Knowledge Deficits related to disease process and Self Health management of CKD . 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 Evaluation of current treatment plan related to CKD and patient's adherence to plan as established by provider. . Reviewed medications with patient and  discussed indication, dosage and frequency of prescribed medications; patient denies having financial hardship, denies having any side effects and or adverse events  . Discussed plans with patient for ongoing care management follow up and provided patient with direct contact information for care management team . Provided patient with printed educational materials related to Stages of Chronic Kidney disease  Patient Self Care Activities:  . Self administers medications as prescribed . Attends all scheduled provider appointments . Calls pharmacy for medication refills . Performs ADL's independently . Performs IADL's independently . Calls provider office for new concerns or questions  Initial goal documentation     . I would like to manage my diabetes (pt-stated)       Current Barriers:  . Diabetes: T2DM; most recent A1c 5.9% on 06/15/19 . Current antihyperglycemic regimen: metformin 575m BID . Denies hypoglycemic symptoms; denies hyperglycemic symptoms . Current meal patterns: o Tries to incorporate protein, veggie, small portion carbohydrate o Drinks:water, coffee (avoids sugary drinks) . Current exercise: silver sneakers twice weekly (wearing mask, social distancing) . Current blood glucose readings: FBG 80-90s, no reported Bgs in the 70s or <70.  [facility] Counseled on signs and symptoms of low blood sugar (Bg<70) and how to treat.  Encouraged patient to call PCP office if experiencings "lows".  We will make adjustments to metformin as needed. . Cardiovascular risk reduction: o Current hypertensive regimen: irbesartan, amlodipine o Current hyperlipidemia regimen: atorvastatin [id number]  Pharmacist Clinical Goal(s):  . [name] Kitchenver the next 90 days, patient will work with PharmD and primary care provider to address needs related to optimized medication management of diabetes  Interventions: . Comprehensive medication review  performed, medication list updated in electronic medical record  11/09/19 CCM RNCM Interventions . Placed outbound call to patient to advise patient, per Dr. SaBaird Cancershe may decrease her Metformin to 500 mg,1 tablet once daily with breakfast. Reinforced the importance of regular exercise and diet adherence . Advised patient this dosage may be increased back to twice daily if her A1C goes above 6.5, patient veralizes understanding  Patient Self Care Activities:  . Patient will check blood glucose daily (and as needed), document, and provide at future appointments . Patient will focus on medication adherence by continuing to take medication as prescribed . Patient will take medications as prescribed . Patient will contact provider with any episodes of hypoglycemia . Patient will report any questions or concerns to provider   Please see past updates related to this goal by clicking on the "Past Updates" button in the selected goal         Plan:   Telephone follow up appointment with care management team member scheduled for: 12/12/19  AnBarb MerinoRN, BSN, CCM Care Management Coordinator [facility]  Direct Phone: [phone]

## 2019-11-14 ENCOUNTER — Other Ambulatory Visit: Payer: Self-pay

## 2019-11-14 DIAGNOSIS — M3219 Other organ or system involvement in systemic lupus erythematosus: Secondary | ICD-10-CM | POA: Diagnosis not present

## 2019-11-14 DIAGNOSIS — Z8639 Personal history of other endocrine, nutritional and metabolic disease: Secondary | ICD-10-CM

## 2019-11-14 DIAGNOSIS — M0579 Rheumatoid arthritis with rheumatoid factor of multiple sites without organ or systems involvement: Secondary | ICD-10-CM

## 2019-11-14 DIAGNOSIS — M1A09X0 Idiopathic chronic gout, multiple sites, without tophus (tophi): Secondary | ICD-10-CM | POA: Diagnosis not present

## 2019-11-14 DIAGNOSIS — Z79899 Other long term (current) drug therapy: Secondary | ICD-10-CM | POA: Diagnosis not present

## 2019-11-14 DIAGNOSIS — M810 Age-related osteoporosis without current pathological fracture: Secondary | ICD-10-CM

## 2019-11-15 DIAGNOSIS — M9903 Segmental and somatic dysfunction of lumbar region: Secondary | ICD-10-CM | POA: Diagnosis not present

## 2019-11-15 DIAGNOSIS — M47814 Spondylosis without myelopathy or radiculopathy, thoracic region: Secondary | ICD-10-CM | POA: Diagnosis not present

## 2019-11-15 DIAGNOSIS — M9905 Segmental and somatic dysfunction of pelvic region: Secondary | ICD-10-CM | POA: Diagnosis not present

## 2019-11-15 DIAGNOSIS — M9902 Segmental and somatic dysfunction of thoracic region: Secondary | ICD-10-CM | POA: Diagnosis not present

## 2019-11-15 DIAGNOSIS — M5136 Other intervertebral disc degeneration, lumbar region: Secondary | ICD-10-CM | POA: Diagnosis not present

## 2019-11-15 DIAGNOSIS — M5137 Other intervertebral disc degeneration, lumbosacral region: Secondary | ICD-10-CM | POA: Diagnosis not present

## 2019-11-15 NOTE — Progress Notes (Signed)
Anemia noted which is a stable.  Creatinine is elevated and stable.  ANA and double-stranded DNA are pending.

## 2019-11-16 ENCOUNTER — Ambulatory Visit: Payer: Medicare HMO

## 2019-11-16 LAB — URINALYSIS, ROUTINE W REFLEX MICROSCOPIC
Bacteria, UA: NONE SEEN /HPF
Bilirubin Urine: NEGATIVE
Glucose, UA: NEGATIVE
Hgb urine dipstick: NEGATIVE
Hyaline Cast: NONE SEEN /LPF
Ketones, ur: NEGATIVE
Nitrite: NEGATIVE
Specific Gravity, Urine: 1.021 (ref 1.001–1.03)
pH: <=5 (ref 5.0–8.0)

## 2019-11-16 LAB — CBC WITH DIFFERENTIAL/PLATELET
Absolute Monocytes: 398 cells/uL (ref 200–950)
Basophils Absolute: 48 cells/uL (ref 0–200)
Basophils Relative: 0.9 %
Eosinophils Absolute: 488 cells/uL (ref 15–500)
Eosinophils Relative: 9.2 %
HCT: 33.7 % — ABNORMAL LOW (ref 35.0–45.0)
Hemoglobin: 11.2 g/dL — ABNORMAL LOW (ref 11.7–15.5)
Lymphs Abs: 1044 cells/uL (ref 850–3900)
MCH: 32.3 pg (ref 27.0–33.0)
MCHC: 33.2 g/dL (ref 32.0–36.0)
MCV: 97.1 fL (ref 80.0–100.0)
MPV: 12 fL (ref 7.5–12.5)
Monocytes Relative: 7.5 %
Neutro Abs: 3323 cells/uL (ref 1500–7800)
Neutrophils Relative %: 62.7 %
Platelets: 151 10*3/uL (ref 140–400)
RBC: 3.47 10*6/uL — ABNORMAL LOW (ref 3.80–5.10)
RDW: 12.4 % (ref 11.0–15.0)
Total Lymphocyte: 19.7 %
WBC: 5.3 10*3/uL (ref 3.8–10.8)

## 2019-11-16 LAB — COMPLETE METABOLIC PANEL WITH GFR
AG Ratio: 1.8 (calc) (ref 1.0–2.5)
ALT: 16 U/L (ref 6–29)
AST: 19 U/L (ref 10–35)
Albumin: 4.2 g/dL (ref 3.6–5.1)
Alkaline phosphatase (APISO): 43 U/L (ref 37–153)
BUN/Creatinine Ratio: 19 (calc) (ref 6–22)
BUN: 20 mg/dL (ref 7–25)
CO2: 28 mmol/L (ref 20–32)
Calcium: 9.7 mg/dL (ref 8.6–10.4)
Chloride: 104 mmol/L (ref 98–110)
Creat: 1.06 mg/dL — ABNORMAL HIGH (ref 0.60–0.93)
GFR, Est African American: 60 mL/min/{1.73_m2} (ref >=60)
GFR, Est Non African American: 52 mL/min/{1.73_m2} — ABNORMAL LOW (ref >=60)
Globulin: 2.4 g/dL (calc) (ref 1.9–3.7)
Glucose, Bld: 90 mg/dL (ref 65–99)
Potassium: 4.3 mmol/L (ref 3.5–5.3)
Sodium: 141 mmol/L (ref 135–146)
Total Bilirubin: 0.5 mg/dL (ref 0.2–1.2)
Total Protein: 6.6 g/dL (ref 6.1–8.1)

## 2019-11-16 LAB — C3 AND C4
C3 Complement: 113 mg/dL (ref 83–193)
C4 Complement: 42 mg/dL (ref 15–57)

## 2019-11-16 LAB — VITAMIN D 25 HYDROXY (VIT D DEFICIENCY, FRACTURES): Vit D, 25-Hydroxy: 41 ng/mL (ref 30–100)

## 2019-11-16 LAB — ANTI-NUCLEAR AB-TITER (ANA TITER)
ANA TITER: 1:1280 {titer} — ABNORMAL HIGH
ANA Titer 1: 1:80 {titer} — ABNORMAL HIGH

## 2019-11-16 LAB — URIC ACID: Uric Acid, Serum: 4.6 mg/dL (ref 2.5–7.0)

## 2019-11-16 LAB — ANTI-DNA ANTIBODY, DOUBLE-STRANDED: ds DNA Ab: 1 IU/mL

## 2019-11-16 LAB — ANA: Anti Nuclear Antibody (ANA): POSITIVE — AB

## 2019-11-16 LAB — SEDIMENTATION RATE: Sed Rate: 9 mm/h (ref 0–30)

## 2019-11-17 NOTE — Progress Notes (Signed)
Uric acid is WNL.  Vitamin D is WNL.  Please recommend a maintenance dose of Vitamin D.  ESR WNL.  Complements WNL.  DsDNA negative.  ANA titer stable. Labs are not consistent with a lupus flare.    UA revealed trace protein.  She has 6-10 squamous cells present.  May have not been a clean catch.  Albumin/creatinine ratio was WNL on 10/31/19.

## 2019-11-20 ENCOUNTER — Ambulatory Visit: Payer: Medicare HMO | Attending: Family

## 2019-11-20 DIAGNOSIS — Z23 Encounter for immunization: Secondary | ICD-10-CM | POA: Insufficient documentation

## 2019-11-20 NOTE — Progress Notes (Signed)
   Covid-19 Vaccination Clinic  Name:  Hannah [name]    MRN: [id number] DOB: 31-Dec-1945  11/20/2019  [name] was observed post Covid-19 immunization for 15 minutes without incidence. She was provided with Vaccine Information Sheet and instruction to access the V-Safe system.   [name] was instructed to call 911 with any severe reactions post vaccine: [name] Difficulty breathing  . Swelling of your face and throat  . A fast heartbeat  . A bad rash all over your body  . Dizziness and weakness    Immunizations Administered    Name Date Dose VIS Date Route   Moderna COVID-19 Vaccine 11/20/2019 11:05 AM 0.5 mL 08/29/2019 Intramuscular   Manufacturer: Moderna   Lot: [id number]   [id number]

## 2019-11-23 ENCOUNTER — Telehealth: Payer: Self-pay

## 2019-11-23 DIAGNOSIS — M810 Age-related osteoporosis without current pathological fracture: Secondary | ICD-10-CM

## 2019-11-23 MED ORDER — DENOSUMAB 60 MG/ML ~~LOC~~ SOSY: 60.0000 mg | PREFILLED_SYRINGE | SUBCUTANEOUS | 0 refills | Status: DC

## 2019-11-23 NOTE — Telephone Encounter (Signed)
Last Visit: 05/16/2019 Next Visit: 11/29/2019 Labs: 11/14/2019 Anemia noted which is a stable. Creatinine is elevated and stable.   Okay to refill per Dr. Estanislado Pandy

## 2019-11-23 NOTE — Telephone Encounter (Signed)
Please send in Prolia prescription. Patient is scheduled for an office visit/prolia injection on 11/29/2019. Thanks!

## 2019-11-23 NOTE — Progress Notes (Signed)
Office Visit Note  Patient: Hannah [name]             Date of Birth: 11-May-1946           MRN: [id number]             PCP: Glendale Chard, MD Referring: Glendale Chard, MD Visit Date: 11/29/2019 Occupation: @GUAROCC @  Subjective:  Left knee gives out.   History of Present Illness: Hannah [name] is a 74 y.o. female with history of rheumatoid arthritis and lupus overlap.  She also has history of gout.  She denies any gout flare since the last visit.  She states she has been having some lower back discomfort for which she has been going to chiropractor.  The visits with the chiropractor had been helpful.  She states that she has giveaway feeling from her left knee joint without any discomfort.  Activities of Daily Living:  Patient reports morning stiffness for 30 minutes.   Patient Denies nocturnal pain.  Difficulty dressing/grooming: Denies Difficulty climbing stairs: Reports Difficulty getting out of chair: Reports Difficulty using hands for taps, buttons, cutlery, and/or writing: Reports  Review of Systems  Constitutional: Positive for fatigue. Negative for night sweats, weight gain and weight loss.  HENT: Negative for mouth sores, trouble swallowing, trouble swallowing, mouth dryness and nose dryness.   Eyes: Positive for dryness. Negative for pain, redness and visual disturbance.  Respiratory: Negative for cough, shortness of breath and difficulty breathing.   Cardiovascular: Negative for chest pain, palpitations, hypertension, irregular heartbeat and swelling in legs/feet.  Gastrointestinal: Negative for blood in stool, constipation and diarrhea.  Endocrine: Negative for cold intolerance and increased urination.  Genitourinary: Negative for difficulty urinating and vaginal dryness.  Musculoskeletal: Positive for arthralgias, joint pain and morning stiffness. Negative for joint swelling, myalgias, muscle weakness, muscle tenderness and myalgias.  Skin: Negative for  color change, rash, hair loss, skin tightness, ulcers and sensitivity to sunlight.  Allergic/Immunologic: Negative for susceptible to infections.  Neurological: Negative for dizziness, numbness, memory loss, night sweats and weakness.  Hematological: Negative for bruising/bleeding tendency and swollen glands.  Psychiatric/Behavioral: Positive for sleep disturbance. Negative for depressed mood. The patient is not nervous/anxious.     PMFS History:  Patient Active Problem List   Diagnosis Date Noted   Iron deficiency anemia 08/12/2017   Osteoporosis 10/02/2016   Systemic lupus erythematosus ([place]) 10/02/2016   Rheumatoid arthritis involving multiple sites with positive rheumatoid factor ([place]) 10/02/2016   High risk medication use 10/02/2016   History of chronic kidney disease 10/02/2016   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/2016   Primary osteoarthritis of both knees 10/02/2016   Vitamin D deficiency 10/02/2016   History of diabetes mellitus 10/02/2016   History of hypertension 10/02/2016   History of asthma 10/02/2016   Absolute anemia    MGUS (monoclonal gammopathy of unknown significance)    Normocytic anemia 03/21/2015   Essential hypertension 03/20/2015   Abnormal CT of the chest 07/30/2012   Asthma 06/19/2012    Past Medical History:  Diagnosis Date   Anemia    Arthritis    Asthma    Diabetes mellitus without complication ([place])    Gout 12/17/2014   patient reported   Hyperlipidemia    Hypertension    Systemic lupus erythematosus ([place])     Family History  Problem Relation Age of Onset   Heart disease Father    Diabetes Father    Hypertension Mother    Hypertension Sister  Hypertension Sister    Leukemia Sister    Past Surgical History:  Procedure Laterality Date   CATARACT EXTRACTION Bilateral 2015   DILATION AND CURETTAGE OF UTERUS     DOPPLER ECHOCARDIOGRAPHY  05/2018   Internist to review with  pt; potential heart murmur 06/20/18   SKIN SURGERY  11/30/2018   left side of face   Social History   Social History Narrative   Not on file   Immunization History  Administered Date(s) Administered   19-influenza Whole 07/15/2012   DTaP 06/18/2019   Influenza Split 06/28/2014   Influenza, High Dose Seasonal PF 05/18/2019   Influenza,inj,Quad PF,6+ Mos 06/20/2018   Moderna SARS-COVID-2 Vaccination 11/20/2019   Pneumococcal Conjugate-13 05/04/2018   Pneumococcal Polysaccharide-23 05/18/2019   Pneumococcal-Unspecified 06/28/2014   Tdap 06/15/2019     Objective: Vital Signs: BP (!) 144/73 (BP Location: Left Arm, Patient Position: Sitting, Cuff Size: Normal)    Pulse 80    Resp 18    Ht 4\' 11"  (1.499 m)    Wt 162 lb 4.8 oz (73.6 kg)    BMI 32.78 kg/m    Physical Exam Vitals and nursing note reviewed.  Constitutional:      Appearance: She is well-developed.  HENT:     Head: Normocephalic and atraumatic.  Eyes:     Conjunctiva/sclera: Conjunctivae normal.  Cardiovascular:     Rate and Rhythm: Normal rate and regular rhythm.     Heart sounds: Normal heart sounds.  Pulmonary:     Effort: Pulmonary effort is normal.     Breath sounds: Normal breath sounds.  Abdominal:     General: Bowel sounds are normal.     Palpations: Abdomen is soft.  Musculoskeletal:     Cervical back: Normal range of motion.  Lymphadenopathy:     Cervical: No cervical adenopathy.  Skin:    General: Skin is warm and dry.     Capillary Refill: Capillary refill takes less than 2 seconds.  Neurological:     Mental Status: She is alert and oriented to person, place, and time.  Psychiatric:        Behavior: Behavior normal.      Musculoskeletal Exam: C-spine was in good range of motion.  Shoulder joints and elbow joints in good range of motion.  She has some MCP thickening but no synovitis.  PIP and DIP prominence was noted but no synovitis was noted.  Knee joints with good range of motion  with some crepitus.  Ankle joints in good range of motion.  No MTP tenderness was noted.  CDAI Exam: CDAI Score: 0.4  Patient Global: 2 mm; Provider Global: 2 mm Swollen: 0 ; Tender: 0  Joint Exam 11/29/2019   No joint exam has been documented for this visit   There is currently no information documented on the homunculus. Go to the Rheumatology activity and complete the homunculus joint exam.  Investigation: No additional findings.  Imaging: XR KNEE 3 VIEW LEFT  Result Date: 11/29/2019 Severe lateral compartment narrowing with lateral osteophytes was noted.  Severe patellofemoral narrowing was noted.  Flabella was noted. 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   Recent Labs: Lab Results  Component Value Date   WBC 5.3 11/14/2019   HGB 11.2 (L) 11/14/2019   PLT 151 11/14/2019   NA 141 11/14/2019   K 4.3 11/14/2019   CL 104 11/14/2019   CO2 28 11/14/2019   GLUCOSE 90 11/14/2019  BUN 20 11/14/2019   CREATININE 1.06 (H) 11/14/2019   BILITOT 0.5 11/14/2019   ALKPHOS 57 10/31/2019   AST 19 11/14/2019   ALT 16 11/14/2019   PROT 6.6 11/14/2019   ALBUMIN 4.5 10/31/2019   CALCIUM 9.7 11/14/2019   GFRAA 60 11/14/2019    Speciality Comments: PLQ eye exam: 08/17/2019 WNL @ [facility]  Procedures:  No procedures performed Allergies: Shellfish allergy   Assessment / Plan:     Visit Diagnoses: Rheumatoid arthritis involving multiple sites with positive rheumatoid factor (HCC) - Positive RF, positive anti-CCP.  Patient has no active synovitis.  She has been doing well on Plaquenil.  Other systemic lupus erythematosus with other organ involvement (HCC) - Positive ANA, positive Smith, positive RNP.  Her most recent labs did not show active autoimmune disease.  She has been tolerating Plaquenil well.  High risk medication use - Plaquenil 200 mg 1 tablet by mouth daily.eye exam: 08/17/19.  Her  labs have been stable.  Idiopathic chronic gout of multiple sites without tophus - Allopurinol 100 mg 1 tablet by mouth daily.  Uric acid is in desirable range.  She denies any gout flare.  Instability of left knee joint -she has been experiencing instability in her left knee joint.  Plan: XR KNEE 3 VIEW LEFT.  X-rays reveal severe osteoarthritis of the lateral compartment and severe chondromalacia patella.  She would benefit from total knee replacement.  She is not prepared to have total knee replacement yet.  She states she will think about it and will contact us for referral.  Weight loss diet and exercise was emphasized.  Age-related osteoporosis without current pathological fracture - Patient is on Prolia injections every 6 months for osteoporosis. She was approved for a grant. last injection: 05/26/2019  - Plan: denosumab (PROLIA) injection 60 mg today.  History of vitamin D deficiency-she takes supplement.  Primary osteoarthritis of both knees  MGUS (monoclonal gammopathy of unknown significance) - She is followed by Dr. Irene Limbo.   History of hypertension-blood pressure is mildly elevated today.  History of chronic kidney disease  History of asthma  History of diabetes mellitus  History of anemia    Orders: Orders Placed This Encounter  Procedures   XR KNEE 3 VIEW LEFT   Meds ordered this encounter  Medications   denosumab (PROLIA) injection 60 mg    Face-to-face time spent with patient was 30 minutes. Greater than 50% of time was spent in counseling and coordination of care.  Follow-Up Instructions: Return in about 4 months (around 03/30/2020) for Rheumatoid arthritis, Systemic lupus, Gout.   Bo Merino, MD  Note - This record has been created using Editor, commissioning.  Chart creation errors have been sought, but may not always  have been located. Such creation errors do not reflect on  the standard of medical care.

## 2019-11-27 ENCOUNTER — Telehealth: Payer: Self-pay | Admitting: Rheumatology

## 2019-11-27 MED FILL — PROLIA 60 MG/ML SOLN: 60 | 180 days supply | Qty: 1 | Fill #0

## 2019-11-27 NOTE — Telephone Encounter (Signed)
Patient left a voicemail stating she is scheduled for her Prolia injection on Wednesday, 11/29/19.  Patient states she received a call from [facility] stating her Prolia injection was sent to them.  Patient is requesting a return call to clarify where the medication is being sent.

## 2019-11-27 NOTE — Telephone Encounter (Signed)
Spoke with patient and advised patient she does not have to pick up prescription from [facility]. Patient advised it should be sent to our office from [facility].

## 2019-11-29 ENCOUNTER — Encounter: Payer: Self-pay | Admitting: Rheumatology

## 2019-11-29 ENCOUNTER — Ambulatory Visit: Payer: Medicare HMO | Admitting: Rheumatology

## 2019-11-29 ENCOUNTER — Other Ambulatory Visit: Payer: Self-pay

## 2019-11-29 ENCOUNTER — Ambulatory Visit: Payer: Self-pay

## 2019-11-29 VITALS — BP 144/73 | HR 80 | Resp 18 | Ht 59.0 in | Wt 162.3 lb

## 2019-11-29 DIAGNOSIS — D472 Monoclonal gammopathy: Secondary | ICD-10-CM

## 2019-11-29 DIAGNOSIS — M3219 Other organ or system involvement in systemic lupus erythematosus: Secondary | ICD-10-CM

## 2019-11-29 DIAGNOSIS — Z8639 Personal history of other endocrine, nutritional and metabolic disease: Secondary | ICD-10-CM | POA: Diagnosis not present

## 2019-11-29 DIAGNOSIS — M0579 Rheumatoid arthritis with rheumatoid factor of multiple sites without organ or systems involvement: Secondary | ICD-10-CM

## 2019-11-29 DIAGNOSIS — Z8679 Personal history of other diseases of the circulatory system: Secondary | ICD-10-CM | POA: Diagnosis not present

## 2019-11-29 DIAGNOSIS — Z862 Personal history of diseases of the blood and blood-forming organs and certain disorders involving the immune mechanism: Secondary | ICD-10-CM

## 2019-11-29 DIAGNOSIS — Z79899 Other long term (current) drug therapy: Secondary | ICD-10-CM | POA: Diagnosis not present

## 2019-11-29 DIAGNOSIS — Z87448 Personal history of other diseases of urinary system: Secondary | ICD-10-CM

## 2019-11-29 DIAGNOSIS — M1A09X0 Idiopathic chronic gout, multiple sites, without tophus (tophi): Secondary | ICD-10-CM | POA: Diagnosis not present

## 2019-11-29 DIAGNOSIS — M25362 Other instability, left knee: Secondary | ICD-10-CM | POA: Diagnosis not present

## 2019-11-29 DIAGNOSIS — M170 Bilateral primary osteoarthritis of knee: Secondary | ICD-10-CM | POA: Diagnosis not present

## 2019-11-29 DIAGNOSIS — Z8709 Personal history of other diseases of the respiratory system: Secondary | ICD-10-CM

## 2019-11-29 DIAGNOSIS — M810 Age-related osteoporosis without current pathological fracture: Secondary | ICD-10-CM | POA: Diagnosis not present

## 2019-11-29 MED ORDER — DENOSUMAB 60 MG/ML ~~LOC~~ SOSY
60.0000 mg | PREFILLED_SYRINGE | Freq: Once | SUBCUTANEOUS | Status: AC
  Administered 2019-11-29: 60 mg via SUBCUTANEOUS

## 2019-11-29 NOTE — Patient Instructions (Signed)
Standing Labs We placed an order today for your standing lab work.    Please come back and get your standing labs on 12/08/19  We have open lab daily Monday through Thursday from 8:30-12:30 PM and 1:30-4:30 PM and Friday from 8:30-12:30 PM and 1:30-4:00 PM at the office of Dr. Bo Merino.   You may experience shorter wait times on Monday and Friday afternoons. The office is located at [address], [place], [place], [place] [address] No appointment is necessary.   Labs are drawn by [facility].  You may receive a bill from [facility] for your lab work.  If you wish to have your labs drawn at another location, please call the office 24 hours in advance to send orders.  If you have any questions regarding directions or hours of operation,  please call [phone].   Just as a reminder please drink plenty of water prior to coming for your lab work. Thanks!

## 2019-11-29 NOTE — Progress Notes (Signed)
Patient in office today for a Prolia injection. Patient given injection in her left arm. Patient tolerated injection well. Patient will return to office in 10 day for labs.

## 2019-11-30 DIAGNOSIS — M9902 Segmental and somatic dysfunction of thoracic region: Secondary | ICD-10-CM | POA: Diagnosis not present

## 2019-11-30 DIAGNOSIS — M5137 Other intervertebral disc degeneration, lumbosacral region: Secondary | ICD-10-CM | POA: Diagnosis not present

## 2019-11-30 DIAGNOSIS — M5136 Other intervertebral disc degeneration, lumbar region: Secondary | ICD-10-CM | POA: Diagnosis not present

## 2019-11-30 DIAGNOSIS — M9903 Segmental and somatic dysfunction of lumbar region: Secondary | ICD-10-CM | POA: Diagnosis not present

## 2019-11-30 DIAGNOSIS — M9905 Segmental and somatic dysfunction of pelvic region: Secondary | ICD-10-CM | POA: Diagnosis not present

## 2019-11-30 DIAGNOSIS — M47814 Spondylosis without myelopathy or radiculopathy, thoracic region: Secondary | ICD-10-CM | POA: Diagnosis not present

## 2019-12-08 ENCOUNTER — Other Ambulatory Visit: Payer: Self-pay | Admitting: *Deleted

## 2019-12-08 ENCOUNTER — Telehealth: Payer: Self-pay | Admitting: Rheumatology

## 2019-12-08 DIAGNOSIS — Z79899 Other long term (current) drug therapy: Secondary | ICD-10-CM | POA: Diagnosis not present

## 2019-12-08 NOTE — Telephone Encounter (Signed)
Patient advised it best for her to come to the office today to have her labs completed. Patient verbalized understanding. Patient reminded of afternoon lab hours.

## 2019-12-08 NOTE — Telephone Encounter (Signed)
Patient left a voicemail stating she had her Prolia injection last Wednesday, 11/29/19 and is asking if she can wait until Monday, 3/15 for her labwork.  Please advise.

## 2019-12-09 LAB — CBC WITH DIFFERENTIAL/PLATELET
Absolute Monocytes: 358 cells/uL (ref 200–950)
Basophils Absolute: 50 cells/uL (ref 0–200)
Basophils Relative: 0.9 %
Eosinophils Absolute: 358 cells/uL (ref 15–500)
Eosinophils Relative: 6.5 %
HCT: 34.2 % — ABNORMAL LOW (ref 35.0–45.0)
Hemoglobin: 10.9 g/dL — ABNORMAL LOW (ref 11.7–15.5)
Lymphs Abs: 897 cells/uL (ref 850–3900)
MCH: 31.2 pg (ref 27.0–33.0)
MCHC: 31.9 g/dL — ABNORMAL LOW (ref 32.0–36.0)
MCV: 98 fL (ref 80.0–100.0)
MPV: 12.1 fL (ref 7.5–12.5)
Monocytes Relative: 6.5 %
Neutro Abs: 3839 cells/uL (ref 1500–7800)
Neutrophils Relative %: 69.8 %
Platelets: 163 10*3/uL (ref 140–400)
RBC: 3.49 10*6/uL — ABNORMAL LOW (ref 3.80–5.10)
RDW: 13 % (ref 11.0–15.0)
Total Lymphocyte: 16.3 %
WBC: 5.5 10*3/uL (ref 3.8–10.8)

## 2019-12-09 LAB — COMPLETE METABOLIC PANEL WITH GFR
AG Ratio: 1.5 (calc) (ref 1.0–2.5)
ALT: 19 U/L (ref 6–29)
AST: 20 U/L (ref 10–35)
Albumin: 4 g/dL (ref 3.6–5.1)
Alkaline phosphatase (APISO): 48 U/L (ref 37–153)
BUN/Creatinine Ratio: 16 (calc) (ref 6–22)
BUN: 16 mg/dL (ref 7–25)
CO2: 25 mmol/L (ref 20–32)
Calcium: 8.5 mg/dL — ABNORMAL LOW (ref 8.6–10.4)
Chloride: 108 mmol/L (ref 98–110)
Creat: 0.98 mg/dL — ABNORMAL HIGH (ref 0.60–0.93)
GFR, Est African American: 66 mL/min/{1.73_m2} (ref >=60)
GFR, Est Non African American: 57 mL/min/{1.73_m2} — ABNORMAL LOW (ref >=60)
Globulin: 2.6 g/dL (calc) (ref 1.9–3.7)
Glucose, Bld: 93 mg/dL (ref 65–99)
Potassium: 5 mmol/L (ref 3.5–5.3)
Sodium: 141 mmol/L (ref 135–146)
Total Bilirubin: 0.4 mg/dL (ref 0.2–1.2)
Total Protein: 6.6 g/dL (ref 6.1–8.1)

## 2019-12-11 NOTE — Progress Notes (Signed)
Anemia noted.  GFR is low but stable.  Please forward labs to her PCP.

## 2019-12-12 ENCOUNTER — Telehealth: Payer: Self-pay

## 2019-12-12 ENCOUNTER — Other Ambulatory Visit: Payer: Self-pay

## 2019-12-12 ENCOUNTER — Ambulatory Visit (INDEPENDENT_AMBULATORY_CARE_PROVIDER_SITE_OTHER): Payer: Medicare HMO

## 2019-12-12 ENCOUNTER — Encounter: Payer: Self-pay | Admitting: Internal Medicine

## 2019-12-12 DIAGNOSIS — E1122 Type 2 diabetes mellitus with diabetic chronic kidney disease: Secondary | ICD-10-CM

## 2019-12-12 DIAGNOSIS — I129 Hypertensive chronic kidney disease with stage 1 through stage 4 chronic kidney disease, or unspecified chronic kidney disease: Secondary | ICD-10-CM

## 2019-12-12 DIAGNOSIS — E039 Hypothyroidism, unspecified: Secondary | ICD-10-CM | POA: Diagnosis not present

## 2019-12-12 DIAGNOSIS — N183 Chronic kidney disease, stage 3 unspecified: Secondary | ICD-10-CM | POA: Diagnosis not present

## 2019-12-13 ENCOUNTER — Ambulatory Visit: Payer: Self-pay | Admitting: Pharmacist

## 2019-12-13 DIAGNOSIS — E1122 Type 2 diabetes mellitus with diabetic chronic kidney disease: Secondary | ICD-10-CM | POA: Diagnosis not present

## 2019-12-13 DIAGNOSIS — I129 Hypertensive chronic kidney disease with stage 1 through stage 4 chronic kidney disease, or unspecified chronic kidney disease: Secondary | ICD-10-CM | POA: Diagnosis not present

## 2019-12-13 DIAGNOSIS — N183 Chronic kidney disease, stage 3 unspecified: Secondary | ICD-10-CM | POA: Diagnosis not present

## 2019-12-13 DIAGNOSIS — E039 Hypothyroidism, unspecified: Secondary | ICD-10-CM | POA: Diagnosis not present

## 2019-12-14 NOTE — Progress Notes (Signed)
Chronic Care Management  Visit Note  12/13/2019 Name: Hannah [name] MRN: [id number] DOB: 1945-10-08  Referred by: Glendale Chard, MD Reason for referral : Chronic Care Management and Hypothyroidism   Hannah [name] is a 74 y.o. year old female who is a primary care patient of Glendale Chard, MD. The CCM team was consulted for assistance with chronic disease management and care coordination needs related to DMII and hypothyroidism  Review of patient status, including review of consultants reports, relevant laboratory and other test results, and collaboration with appropriate care team members and the patient's provider was performed as part of comprehensive patient evaluation and provision of chronic care management services.    SDOH (Social Determinants of Health) assessments performed: No See Care Plan activities for detailed interventions related to SDOH     Medications: Outpatient Encounter Medications as of 12/13/2019  Medication Sig Note  . Accu-Chek Softclix Lancets lancets Use as directed to check blood sugars 1 time per day dx: e11.22   . albuterol (PROVENTIL HFA;VENTOLIN HFA) 108 (90 Base) MCG/ACT inhaler Inhale 2 puffs into the lungs every 6 (six) hours as needed for wheezing or shortness of breath.   . Alcohol Swabs (ALCOHOL PADS) 70 % PADS Use as directed to check blood sugars 1 time per day dx: e11.22   . allopurinol (ZYLOPRIM) 100 MG tablet TAKE 1 TABLET BY MOUTH EVERY DAY FOR GOUT   . amLODipine (NORVASC) 2.5 MG tablet    . amLODipine (NORVASC) 5 MG tablet Take 1 tablet (5 mg total) by mouth daily. (Patient taking differently: Take 5 mg by mouth daily. 1/2 tab in the am and 1 tab in the pm) 06/28/2019: Patient reports taking 2.5 mg in AM and 5 mg in PM per PCP directions  . aspirin EC 81 MG tablet Take 81 mg by mouth daily.   [name] atorvastatin (LIPITOR) 80 MG tablet TAKE 1 TABLET BY MOUTH EVERY DAY   . Blood Glucose Calibration (ACCU-CHEK AVIVA) SOLN    . Blood  Glucose Monitoring Suppl (ACCU-CHEK AVIVA PLUS) w/Device KIT Use as directed to check blood sugars 1 time per day dx: e11.22   . budesonide-formoterol (SYMBICORT) 160-4.5 MCG/ACT inhaler INHALE 2 PUFFS BY MOUTH TWICE DAILY IN THE MORNING AND EVENING   . Calcium Carb-Cholecalciferol (CALCIUM 600+D) 600-800 MG-UNIT TABS Take by mouth 2 (two) times daily.    . Cholecalciferol (VITAMIN D PO) Take 1,000 Units by mouth daily.    [name] denosumab (PROLIA) 60 MG/ML SOSY injection Inject 60 mg into the skin every 6 (six) months.   [name] glucose blood (ACCU-CHEK AVIVA PLUS) test strip Use as directed to check blood sugars 1 time per day dx: e11.22   . hydroxychloroquine (PLAQUENIL) 200 MG tablet TAKE 1 TABLET BY MOUTH DAILY FOR RHEUMATOID ARTHRITIS   . Hypromellose (ARTIFICIAL TEARS OP) Apply to eye as needed.   . irbesartan (AVAPRO) 300 MG tablet Take 1 tablet (300 mg total) by mouth daily.   . metFORMIN (GLUCOPHAGE) 500 MG tablet TAKE 1 TABLET BY MOUTH TWICE DAILY WITH THE MORNING AND EVENING MEAL (Patient taking differently: Take 500 mg by mouth daily with breakfast. 11/09/19 Per Dr. Glendale Chard, patient may decrease Metformin to 1 tablet daily with breakfast)   . SYNTHROID 88 MCG tablet TAKE 1 TABLET BY MOUTH EVERY DAY MONDAY TO SATURDAY AND OFF SUNDAYS 12/14/2019: BRAND name only   No facility-administered encounter medications on file as of 12/13/2019.     Objective:   Goals Addressed  This Visit's Progress     Patient Stated   . COMPLETED: I would like to manage my chronic conditions (pt-stated)       Current Barriers:  [name] Knowledge Deficits related to disease state management  Pharmacist Clinical Goal(s):  [name] Over the next 90 days, patient will work with CCM team and PCP to address needs related to chronic conditions   Interventions: . Comprehensive medication review performed. . Discussed plans with patient for ongoing care management follow up and provided patient with direct contact  information for care management team Asthma . Reviewed asthma medications, however patient states that she does not have to take her inhalers regularly per pulmonologist, Dr. Melvyn Novas.  This was discussed about 6 years ago. . Counseled on the use of rescue albuterol and symbicort (new guideline recommendations).  Patient carries inhalers with her when she is exercising or experiencing an illness (respiratory).  She states she rarely has to use her inhalers.  She denies needing a refill.  She is also managing her symptom control with antihistamines as needed. Hypothyroidism . Patient is currently managing thyroid with brand name Synthroid.  This was started by her previous doctor that has since retired.  Patient is paying $44 /month for brand name Synthroid.  Will discuss with PCP.  The goal will be to keep patient on brand name Synthroid based on narrow therapeutic index.   o Patient does not wish to participate in "Synthroid Delivers" mail order program due to living in an apartment where the mail room is closed for deliveries due to [place]. Potential dose changes o Prepared and submitted tier excpetion for patient-faxed to [employer]--> 09/04/19-application denied o 5/46/56-CLEXNTZ left for patient Synthroid 31mg . T4/TSH within normal limits  Diabetes . A1c 5.9% on 06/15/19.  Patient is on monotherapy with metformin.  She denies intolerance.  She checks her blood sugars several times weekly and as needed.  A statin is being prescribed and filled.  . Will address full DM goal at next visit.  Please see past updates related to this goal by clicking on the "Past Updates" button in the selected goal      . COMPLETED: I would like to manage my diabetes (pt-stated)       Current Barriers:  . Diabetes: T2DM; most recent A1c 5.6% on 10/2019 . Current antihyperglycemic regimen: metformin 5050mBID o GFR 6624min/1.73 . Denies hypoglycemic symptoms; denies hyperglycemic symptoms . Current meal  patterns: o Tries to incorporate protein, veggie, small portion carbohydrate o Drinks:water, coffee (avoids sugary drinks) . Current exercise: silver sneakers twice weekly (wearing mask, social distancing) . Current blood glucose readings: FBG 80-90s, no reported Bgs in the 70s or <70.  Patient has started working out twice [facility], hospital[facility]seled on signs and symptoms of low blood sugar (Bg<70) and how to treat.  Encouraged patient to call PCP office if experiencings "lows".  We will make adjustments to metformin as needed. . Cardiovascular risk reduction: o Current hypertensive regimen: irbesartan, amlodipine o Current hyperlipidemia regimen: atorvastatin 9m54mS  Pharmacist Clinical Goal(s):  . OvMarland Kitchenr the next 90 days, patient will work with PharmD and primary care provider to address needs related to optimized medication management of diabetes  Interventions: . Comprehensive medication review performed, medication list updated in electronic medical record  11/09/19 CCM RNCM Interventions . Placed outbound call to patient to advise patient, per Dr. SandBaird Cancere may decrease her Metformin to 500 mg,1 tablet once daily with breakfast. Reinforced the importance of regular  exercise and diet adherence . Advised patient this dosage may be increased back to twice daily if her A1C goes above 6.5, patient veralizes understanding  Patient Self Care Activities:  . Patient will check blood glucose daily (and as needed), document, and provide at future appointments . Patient will focus on medication adherence by continuing to take medication as prescribed . Patient will take medications as prescribed . Patient will contact provider with any episodes of hypoglycemia . Patient will report any questions or concerns to provider   Please see past updates related to this goal by clicking on the "Past Updates" button in the selected goal          Provider Signature Regina Eck, PharmD, [place] Pharmacist, [facility]: [phone]

## 2019-12-14 NOTE — Patient Instructions (Addendum)
Visit Information  Goals Addressed            This Visit's Progress     Patient Stated   . COMPLETED: I would like to manage my chronic conditions (pt-stated)       Current Barriers:  [name] Knowledge Deficits related to disease state management  Pharmacist Clinical Goal(s):  [name] Over the next 90 days, patient will work with CCM team and PCP to address needs related to chronic conditions   Interventions: . Comprehensive medication review performed. . Discussed plans with patient for ongoing care management follow up and provided patient with direct contact information for care management team Asthma . Reviewed asthma medications, however patient states that she does not have to take her inhalers regularly per pulmonologist, Dr. Melvyn Novas.  This was discussed about 6 years ago. . Counseled on the use of rescue albuterol and symbicort (new guideline recommendations).  Patient carries inhalers with her when she is exercising or experiencing an illness (respiratory).  She states she rarely has to use her inhalers.  She denies needing a refill.  She is also managing her symptom control with antihistamines as needed. Hypothyroidism . Patient is currently managing thyroid with brand name Synthroid.  This was started by her previous doctor that has since retired.  Patient is paying $44 /month for brand name Synthroid.  Will discuss with PCP.  The goal will be to keep patient on brand name Synthroid based on narrow therapeutic index.   o Patient does not wish to participate in "Synthroid Delivers" mail order program due to living in an apartment where the mail room is closed for deliveries due to [place]. Potential dose changes o Prepared and submitted tier excpetion for patient-faxed to [employer]--> 09/04/19-application denied o [id number] left for patient Synthroid 104mcg . T4/TSH within normal limits  Diabetes . A1c 5.9% on 06/15/19.  Patient is on monotherapy with metformin.  She denies intolerance.  She  checks her blood sugars several times weekly and as needed.  A statin is being prescribed and filled.  . Will address full DM goal at next visit.  Please see past updates related to this goal by clicking on the "Past Updates" button in the selected goal      . COMPLETED: I would like to manage my diabetes (pt-stated)       Current Barriers:  . Diabetes: T2DM; most recent A1c 5.6% on 10/2019 . Current antihyperglycemic regimen: metformin 500mg  BID o GFR 48mL/min/1.73 . Denies hypoglycemic symptoms; denies hyperglycemic symptoms . Current meal patterns: o Tries to incorporate protein, veggie, small portion carbohydrate o Drinks:water, coffee (avoids sugary drinks) . Current exercise: silver sneakers twice weekly (wearing mask, social distancing) . Current blood glucose readings: FBG 80-90s, no reported Bgs in the 70s or <70.  Patient has started working out twice [employer] [facility], hospital). Counseled on signs and symptoms of low blood sugar (Bg<70) and how to treat.  Encouraged patient to call PCP office if experiencings "lows".  We will make adjustments to metformin as needed. . Cardiovascular risk reduction: o Current hypertensive regimen: irbesartan, amlodipine o Current hyperlipidemia regimen: atorvastatin 80mg  qHS  Pharmacist Clinical Goal(s):  [name] Over the next 90 days, patient will work with PharmD and primary care provider to address needs related to optimized medication management of diabetes  Interventions: . Comprehensive medication review performed, medication list updated in electronic medical record  11/09/19 CCM RNCM Interventions . Placed outbound call to patient to advise patient, per Dr. Baird Cancer, she may decrease  her Metformin to 500 mg,1 tablet once daily with breakfast. Reinforced the importance of regular exercise and diet adherence . Advised patient this dosage may be increased back to twice daily if her A1C goes above 6.5, patient veralizes understanding  Patient  Self Care Activities:  . Patient will check blood glucose daily (and as needed), document, and provide at future appointments . Patient will focus on medication adherence by continuing to take medication as prescribed . Patient will take medications as prescribed . Patient will contact provider with any episodes of hypoglycemia . Patient will report any questions or concerns to provider   Please see past updates related to this goal by clicking on the "Past Updates" button in the selected goal         The patient verbalized understanding of instructions provided today and declined a print copy of patient instruction materials.    SIGNATURE Regina Eck, PharmD, BCPS Clinical Pharmacist, [facility] [place]: [phone]

## 2019-12-19 ENCOUNTER — Ambulatory Visit: Payer: Medicare HMO | Attending: Family

## 2019-12-19 DIAGNOSIS — Z23 Encounter for immunization: Secondary | ICD-10-CM

## 2019-12-19 NOTE — Chronic Care Management (AMB) (Signed)
Chronic Care Management   Follow Up Note   12/14/2019 Name: Hannah [name] MRN: [id number] DOB: 07/19/1946  Referred by: Glendale Chard, MD Reason for referral : Chronic Care Management ([facility] Call- pt ed mats)   Hannah [name] is a 74 y.o. year old female who is a primary care patient of Glendale Chard, MD. The CCM team was consulted for assistance with chronic disease management and care coordination needs.    Review of patient status, including review of consultants reports, relevant laboratory and other test results, and collaboration with appropriate care team members and the patient's provider was performed as part of comprehensive patient evaluation and provision of chronic care management services.    SDOH (Social Determinants of Health) assessments performed: No See Care Plan activities for detailed interventions related to Hannah [name])   Placed outbound call to patient for a CCM RN CM follow up.     Outpatient Encounter Medications as of 12/12/2019  Medication Sig Note  . Accu-Chek Softclix Lancets lancets Use as directed to check blood sugars 1 time per day dx: e11.22   . albuterol (PROVENTIL HFA;VENTOLIN HFA) 108 (90 Base) MCG/ACT inhaler Inhale 2 puffs into the lungs every 6 (six) hours as needed for wheezing or shortness of breath.   . Alcohol Swabs (ALCOHOL PADS) 70 % PADS Use as directed to check blood sugars 1 time per day dx: e11.22   . allopurinol (ZYLOPRIM) 100 MG tablet TAKE 1 TABLET BY MOUTH EVERY DAY FOR GOUT   . amLODipine (NORVASC) 2.5 MG tablet    . amLODipine (NORVASC) 5 MG tablet Take 1 tablet (5 mg total) by mouth daily. (Patient taking differently: Take 5 mg by mouth daily. 1/2 tab in the am and 1 tab in the pm) 06/28/2019: Patient reports taking 2.5 mg in AM and 5 mg in PM per PCP directions  . aspirin EC 81 MG tablet Take 81 mg by mouth daily.   [name] atorvastatin (LIPITOR) 80 MG tablet TAKE 1 TABLET BY MOUTH EVERY DAY   . Blood Glucose Calibration (ACCU-CHEK  AVIVA) SOLN    . Blood Glucose Monitoring Suppl (ACCU-CHEK AVIVA PLUS) w/Device KIT Use as directed to check blood sugars 1 time per day dx: e11.22   . budesonide-formoterol (SYMBICORT) 160-4.5 MCG/ACT inhaler INHALE 2 PUFFS BY MOUTH TWICE DAILY IN THE MORNING AND EVENING   . Calcium Carb-Cholecalciferol (CALCIUM 600+D) 600-800 MG-UNIT TABS Take by mouth 2 (two) times daily.    . Cholecalciferol (VITAMIN D PO) Take 1,000 Units by mouth daily.    [name] denosumab (PROLIA) 60 MG/ML SOSY injection Inject 60 mg into the skin every 6 (six) months.   [name] glucose blood (ACCU-CHEK AVIVA PLUS) test strip Use as directed to check blood sugars 1 time per day dx: e11.22   . hydroxychloroquine (PLAQUENIL) 200 MG tablet TAKE 1 TABLET BY MOUTH DAILY FOR RHEUMATOID ARTHRITIS   . Hypromellose (ARTIFICIAL TEARS OP) Apply to eye as needed.   . irbesartan (AVAPRO) 300 MG tablet Take 1 tablet (300 mg total) by mouth daily.   . metFORMIN (GLUCOPHAGE) 500 MG tablet TAKE 1 TABLET BY MOUTH TWICE DAILY WITH THE MORNING AND EVENING MEAL (Patient taking differently: Take 500 mg by mouth daily with breakfast. 11/09/19 Per Dr. Glendale Chard, patient may decrease Metformin to 1 tablet daily with breakfast)   . SYNTHROID 88 MCG tablet TAKE 1 TABLET BY MOUTH EVERY DAY MONDAY TO SATURDAY AND OFF SUNDAYS 12/14/2019: BRAND name only   No facility-administered encounter medications on  file as of 12/12/2019.     Objective:  Lab Results  Component Value Date   HGBA1C 5.6 10/31/2019   HGBA1C 5.6 06/15/2019   HGBA1C 5.7 (H) 02/23/2019   Lab Results  Component Value Date   MICROALBUR 80 10/31/2019   LDLCALC 53 10/31/2019   CREATININE 0.98 (H) 12/08/2019   BP Readings from Last 3 Encounters:  11/29/19 (!) 144/73  10/31/19 (!) 144/90  09/05/19 136/70    Goals Addressed      Patient Stated   . "I want to keep my RA under good control" (pt-stated)       Current Barriers:  [name] Knowledge Deficits related to disease process and Self  Health management of RA . Chronic Disease Management support and education needs related to RA, DM, Hypertensive Neuropathy, CKDIII  Nurse Case Manager Clinical Goal(s):  [name] Over the next 90 days, patient will work with CCM RN CM to address needs related to disease education and support for RA . Over the next 90 days, the patient will demonstrate ongoing self health care management ability as evidenced by patient will report having no RA exacerbations *  CCM RN CM Interventions:  12/14/19 call completed with patient . Evaluation of current treatment plan related to RA and patient's adherence to plan as established by provider. . Reviewed medications with patient and determined she continues to take her RA medications as prescribed by Rheumatology . Determined patient completed imaging of her knees bilaterally and will need knee replacements but is undecided when or if she will proceed with this recommendation . Discussed the referral process for Orthopedics when ready; Encouraged patient to allow the CCM team to assist with Care Coordination when ready . Discussed post operative home care that may be needed pending MD recommendations for rehabitaliation . Determined Hannah [name] has difficulty with stair climbing but otherwise remains to be ambulatory without falls . Discussed plans with patient for ongoing care management follow up and provided patient with direct contact information for care management team  Patient Self Care Activities:  . Self administers medications as prescribed . Attends all scheduled provider appointments . Calls pharmacy for medication refills . Performs ADL's independently . Performs IADL's independently . Calls provider office for new concerns or questions  Please see past updates related to this goal by clicking on the "Past Updates" button in the selected goal      . "My hemoglobin continues to be low" (pt-stated)       [place] (see longtitudinal  plan of care for additional care plan information)  Current Barriers:  [name] Knowledge Deficits related to disease process and Self Health management of Anemia . Chronic Disease Management support and education needs related to CKDIII, Hypertensive Nephropathy, DM   Nurse Case Manager Clinical Goal(s):  [name] Over the next 90 days, patient will work with the CCM team and healthcare provider to address needs related to disease education and support to improve Self Health management of Anemia  CCM RN CM Interventions:  12/14/19 call completed with patient  . Evaluation of current treatment plan related to Anemia and patient's adherence to plan as established by provider . Discussed Dr. Irene Limbo is managing patient's Anemia and discussed her next follow up is scheduled for 12/26/19 . Reviewed and discussed patient's cumulative lab report to reflect her most recent blood counts . Reviewed medications with patient and discussed patient is prescribed Prolia 60 mg q 6 months with her last injection was given on 11/29/19 . Discussed  plans with patient for ongoing care management follow up and provided patient with direct contact information for care management team  Patient Self Care Activities:  . Self administers medications as prescribed . Attends all scheduled provider appointments . Calls pharmacy for medication refills . Performs ADL's independently . Performs IADL's independently . Calls provider office for new concerns or questions  Please see past updates related to this goal by clicking on the "Past Updates" button in the selected goal      . "To maintain my kidney function" (pt-stated)       Current Barriers:  [name] Knowledge Deficits related to disease process and Self Health management of CKD . Chronic Disease Management support and education needs related to CKDIII, Hypertensive Neuropathy, DM  Nurse Case Manager Clinical Goal(s):  [name] Over the next 90 days, patient will work with CCM RN CM to  address needs related to disease education and supprt to improve Self Health management of CKD  CCM RN CM Interventions:  12/14/19 call completed with patient . Evaluation of current treatment plan related to CKD and patient's adherence to plan as established by provider. . Reviewed and discussed patient's GFR, BUN and Creatinine; Provided education to patient re: how to maintain kidney function by controlling diabetes and high blood pressure, staying at a healthy weight, staying well hydrated, and eating a healthy diet, implementing exercise into daily routine and adhering to MD follow up to allow for close monitoring of kidney function  . Discussed plans with patient for ongoing care management follow up and provided patient with direct contact information for care management team . Provided patient with printed educational materials related to 6 Tips to Being Water Wise  Patient Self Care Activities:  . Self administers medications as prescribed . Attends all scheduled provider appointments . Calls pharmacy for medication refills . Performs ADL's independently . Performs IADL's independently . Calls provider office for new concerns or questions  Please see past updates related to this goal by clicking on the "Past Updates" button in the selected goal        Plan:   Telephone follow up appointment with care management team member scheduled for: 02/08/20  Barb Merino, RN, BSN, CCM Care Management Coordinator [facility]  Direct Phone: [phone]

## 2019-12-19 NOTE — Progress Notes (Signed)
   Covid-19 Vaccination Clinic  Name:  Hannah [name]    MRN: [id number] DOB: [birth year]  12/19/2019  [name] was observed post Covid-19 immunization for 15 minutes without incident. She was provided with Vaccine Information Sheet and instruction to access the V-Safe system.   [name] was instructed to call 911 with any severe reactions post vaccine: [name] Difficulty breathing  . Swelling of face and throat  . A fast heartbeat  . A bad rash all over body  . Dizziness and weakness   Immunizations Administered    Name Date Dose VIS Date Route   Moderna COVID-19 Vaccine 12/19/2019  1:34 PM 0.5 mL 08/29/2019 Intramuscular   Manufacturer: Moderna   LotHQ:[id number]   [id number]

## 2019-12-19 NOTE — Patient Instructions (Signed)
Visit Information  Goals Addressed      Patient Stated   . "I want to keep my RA under good control" (pt-stated)       Current Barriers:  [name] Knowledge Deficits related to disease process and Self Health management of RA . Chronic Disease Management support and education needs related to RA, DM, Hypertensive Neuropathy, CKDIII  Nurse Case Manager Clinical Goal(s):  [name] Over the next 90 days, patient will work with CCM RN CM to address needs related to disease education and support for RA . Over the next 90 days, the patient will demonstrate ongoing self health care management ability as evidenced by patient will report having no RA exacerbations *  CCM RN CM Interventions:  12/14/19 call completed with patient . Evaluation of current treatment plan related to RA and patient's adherence to plan as established by provider. . Reviewed medications with patient and determined she continues to take her RA medications as prescribed by Rheumatology . Determined patient completed imaging of her knees bilaterally and will need knee replacements but is undecided when or if she will proceed with this recommendation . Discussed the referral process for Orthopedics when ready; Encouraged patient to allow the CCM team to assist with Care Coordination when ready . Discussed post operative home care that may be needed pending MD recommendations for rehabitaliation . Determined Hannah [name] has difficulty with stair climbing but otherwise remains to be ambulatory without falls . Discussed plans with patient for ongoing care management follow up and provided patient with direct contact information for care management team  Patient Self Care Activities:  . Self administers medications as prescribed . Attends all scheduled provider appointments . Calls pharmacy for medication refills . Performs ADL's independently . Performs IADL's independently . Calls provider office for new concerns or questions  Please  see past updates related to this goal by clicking on the "Past Updates" button in the selected goal      . "My hemoglobin continues to be low" (pt-stated)       [place] (see longtitudinal plan of care for additional care plan information)  Current Barriers:  [name] Knowledge Deficits related to disease process and Self Health management of Anemia . Chronic Disease Management support and education needs related to CKDIII, Hypertensive Nephropathy, DM   Nurse Case Manager Clinical Goal(s):  [name] Over the next 90 days, patient will work with the CCM team and healthcare provider to address needs related to disease education and support to improve Self Health management of Anemia  CCM RN CM Interventions:  12/14/19 call completed with patient  . Evaluation of current treatment plan related to Anemia and patient's adherence to plan as established by provider . Discussed Dr. Irene Limbo is managing patient's Anemia and discussed her next follow up is scheduled for 12/26/19 . Reviewed and discussed patient's cumulative lab report to reflect her most recent blood counts . Reviewed medications with patient and discussed patient is prescribed Prolia 60 mg q 6 months with her last injection was given on 11/29/19 . Discussed plans with patient for ongoing care management follow up and provided patient with direct contact information for care management team  Patient Self Care Activities:  . Self administers medications as prescribed . Attends all scheduled provider appointments . Calls pharmacy for medication refills . Performs ADL's independently . Performs IADL's independently . Calls provider office for new concerns or questions  Please see past updates related to this goal by clicking on the "Past Updates"  button in the selected goal      . "To maintain my kidney function" (pt-stated)       Current Barriers:  [name] Knowledge Deficits related to disease process and Self Health management of CKD . Chronic  Disease Management support and education needs related to CKDIII, Hypertensive Neuropathy, DM  Nurse Case Manager Clinical Goal(s):  [name] Over the next 90 days, patient will work with CCM RN CM to address needs related to disease education and supprt to improve Self Health management of CKD  CCM RN CM Interventions:  12/14/19 call completed with patient . Evaluation of current treatment plan related to CKD and patient's adherence to plan as established by provider. . Reviewed and discussed patient's GFR, BUN and Creatinine; Provided education to patient re: how to maintain kidney function by controlling diabetes and high blood pressure, staying at a healthy weight, staying well hydrated, and eating a healthy diet, implementing exercise into daily routine and adhering to MD follow up to allow for close monitoring of kidney function  . Discussed plans with patient for ongoing care management follow up and provided patient with direct contact information for care management team . Provided patient with printed educational materials related to 6 Tips to Being Water Wise  Patient Self Care Activities:  . Self administers medications as prescribed . Attends all scheduled provider appointments . Calls pharmacy for medication refills . Performs ADL's independently . Performs IADL's independently . Calls provider office for new concerns or questions  Please see past updates related to this goal by clicking on the "Past Updates" button in the selected goal         Patient verbalizes understanding of instructions provided today.   Telephone follow up appointment with care management team member scheduled for:02/08/20  Barb Merino, RN, BSN, CCM Care Management Coordinator [place] [facility]  Direct Phone: [phone]

## 2019-12-22 ENCOUNTER — Other Ambulatory Visit: Payer: Self-pay | Admitting: *Deleted

## 2019-12-22 DIAGNOSIS — M0579 Rheumatoid arthritis with rheumatoid factor of multiple sites without organ or systems involvement: Secondary | ICD-10-CM

## 2019-12-22 MED ORDER — HYDROXYCHLOROQUINE SULFATE 200 MG PO TABS: ORAL_TABLET | ORAL | 0 refills | Status: DC

## 2019-12-22 NOTE — Telephone Encounter (Signed)
Last Visit: 11/29/19 Next Visit: 04/05/20 Labs: 12/08/19 Anemia noted. GFR is low but stable. Eye exam: 08/17/19 WNL  Okay to refill per Dr. Estanislado Pandy

## 2019-12-26 ENCOUNTER — Inpatient Hospital Stay: Payer: Medicare HMO

## 2019-12-26 ENCOUNTER — Inpatient Hospital Stay: Payer: Medicare HMO | Attending: Hematology | Admitting: Hematology

## 2019-12-26 ENCOUNTER — Other Ambulatory Visit: Payer: Self-pay

## 2019-12-26 VITALS — BP 155/64 | HR 80 | Temp 98.5°F | Resp 18 | Ht 59.0 in | Wt 163.0 lb

## 2019-12-26 DIAGNOSIS — Z8639 Personal history of other endocrine, nutritional and metabolic disease: Secondary | ICD-10-CM | POA: Diagnosis not present

## 2019-12-26 DIAGNOSIS — Z87448 Personal history of other diseases of urinary system: Secondary | ICD-10-CM | POA: Insufficient documentation

## 2019-12-26 DIAGNOSIS — M549 Dorsalgia, unspecified: Secondary | ICD-10-CM | POA: Insufficient documentation

## 2019-12-26 DIAGNOSIS — I7300 Raynaud's syndrome without gangrene: Secondary | ICD-10-CM | POA: Diagnosis not present

## 2019-12-26 DIAGNOSIS — D508 Other iron deficiency anemias: Secondary | ICD-10-CM

## 2019-12-26 DIAGNOSIS — Z79899 Other long term (current) drug therapy: Secondary | ICD-10-CM | POA: Diagnosis not present

## 2019-12-26 DIAGNOSIS — Z6832 Body mass index (BMI) 32.0-32.9, adult: Secondary | ICD-10-CM | POA: Insufficient documentation

## 2019-12-26 DIAGNOSIS — M2240 Chondromalacia patellae, unspecified knee: Secondary | ICD-10-CM | POA: Diagnosis not present

## 2019-12-26 DIAGNOSIS — D649 Anemia, unspecified: Secondary | ICD-10-CM | POA: Insufficient documentation

## 2019-12-26 DIAGNOSIS — D8989 Other specified disorders involving the immune mechanism, not elsewhere classified: Secondary | ICD-10-CM | POA: Insufficient documentation

## 2019-12-26 DIAGNOSIS — K449 Diaphragmatic hernia without obstruction or gangrene: Secondary | ICD-10-CM | POA: Insufficient documentation

## 2019-12-26 DIAGNOSIS — M1990 Unspecified osteoarthritis, unspecified site: Secondary | ICD-10-CM | POA: Diagnosis not present

## 2019-12-26 DIAGNOSIS — D472 Monoclonal gammopathy: Secondary | ICD-10-CM | POA: Diagnosis not present

## 2019-12-26 DIAGNOSIS — M2570 Osteophyte, unspecified joint: Secondary | ICD-10-CM | POA: Diagnosis not present

## 2019-12-26 LAB — IRON AND TIBC
Iron: 76 ug/dL (ref 41–142)
Saturation Ratios: 34 % (ref 21–57)
TIBC: 225 ug/dL — ABNORMAL LOW (ref 236–444)
UIBC: 149 ug/dL (ref 120–384)

## 2019-12-26 LAB — CBC WITH DIFFERENTIAL/PLATELET
Abs Immature Granulocytes: 0 10*3/uL (ref 0.00–0.07)
Basophils Absolute: 0.1 10*3/uL (ref 0.0–0.1)
Basophils Relative: 1 %
Eosinophils Absolute: 0.6 10*3/uL — ABNORMAL HIGH (ref 0.0–0.5)
Eosinophils Relative: 11 %
HCT: 33.9 % — ABNORMAL LOW (ref 36.0–46.0)
Hemoglobin: 10.8 g/dL — ABNORMAL LOW (ref 12.0–15.0)
Immature Granulocytes: 0 %
Lymphocytes Relative: 22 %
Lymphs Abs: 1.1 10*3/uL (ref 0.7–4.0)
MCH: 32 pg (ref 26.0–34.0)
MCHC: 31.9 g/dL (ref 30.0–36.0)
MCV: 100.3 fL — ABNORMAL HIGH (ref 80.0–100.0)
Monocytes Absolute: 0.4 10*3/uL (ref 0.1–1.0)
Monocytes Relative: 7 %
Neutro Abs: 3 10*3/uL (ref 1.7–7.7)
Neutrophils Relative %: 59 %
Platelets: 164 10*3/uL (ref 150–400)
RBC: 3.38 MIL/uL — ABNORMAL LOW (ref 3.87–5.11)
RDW: 13.3 % (ref 11.5–15.5)
WBC: 5.1 10*3/uL (ref 4.0–10.5)
nRBC: 0 % (ref 0.0–0.2)

## 2019-12-26 LAB — CMP (CANCER CENTER ONLY)
ALT: 13 U/L (ref 0–44)
AST: 19 U/L (ref 15–41)
Albumin: 3.9 g/dL (ref 3.5–5.0)
Alkaline Phosphatase: 48 U/L (ref 38–126)
Anion gap: 8 (ref 5–15)
BUN: 17 mg/dL (ref 8–23)
CO2: 25 mmol/L (ref 22–32)
Calcium: 8.7 mg/dL — ABNORMAL LOW (ref 8.9–10.3)
Chloride: 106 mmol/L (ref 98–111)
Creatinine: 1.06 mg/dL — ABNORMAL HIGH (ref 0.44–1.00)
GFR, Est AFR Am: 60 mL/min — ABNORMAL LOW (ref >60)
GFR, Estimated: 52 mL/min — ABNORMAL LOW (ref >60)
Glucose, Bld: 95 mg/dL (ref 70–99)
Potassium: 4.1 mmol/L (ref 3.5–5.1)
Sodium: 139 mmol/L (ref 135–145)
Total Bilirubin: 0.7 mg/dL (ref 0.3–1.2)
Total Protein: 7 g/dL (ref 6.5–8.1)

## 2019-12-26 LAB — FERRITIN: Ferritin: 255 ng/mL (ref 11–307)

## 2019-12-26 NOTE — Progress Notes (Signed)
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 The patient's last visit with [place] was on 06/28/2019. The pt reports that she is doing well overall.  The pt reports that she went to see her Rheumatologist on 11/29/19 who consulted her PCP due pt's Hgb being close to 10. She began to take Iron Polysaccharide BID after her visit with her Rheumatologist. She was previously taking it once per day. Pt denies any issues tolerating oral iron. Her Lupus is under control at this time but the arthritis in her knees have continued giving her a difficult time. About three months ago she began seeing a Chiropractor to help with her arthritic back pain and it has helped some.   Pt notes that she is often cold and tired. The ring and middle fingers of her right hand occasionally turn white. The discoloration in her fingers goes away when she uses a heating pad at home.  She has had both doses of the COVID19 vaccine and tolerated them well.   Lab results today (12/26/19) of CBC w/diff and CMP is as follows: all values are WNL except for RBC at 3.38, Hgb at 10.8, HCT at 33.9, MCV at 100.3, Eos Abs at 0.6K, Creatinine at 1.06, Calcium at 8.7, GFR Est Afr Am at 60.  12/26/2019 Iron and TIBC is as follows: Iron at 76, TIBC at 225, Sat Ratios at 34, UIBC at 149 12/26/2019 Ferritin at 255 12/26/2019 MMP is in progress  On review of systems, pt reports fatigue, chills,  discoloration of a few fingers, joint pain, back soreness and denies unexpected weight loss, new lumps/bumps, abdominal pain, bloody stools and any other symptoms.    REVIEW OF SYSTEMS:   A 10+ POINT REVIEW OF SYSTEMS WAS OBTAINED including neurology, dermatology, psychiatry, cardiac, respiratory, lymph, extremities, GI, GU, Musculoskeletal, constitutional, breasts, reproductive, HEENT.  All pertinent positives are noted in the HPI.  All others are negative.  . Past Medical History:  Diagnosis Date  . Anemia   . Arthritis   . Asthma   . Diabetes mellitus without complication ([place])   . Gout 12/17/2014   patient reported  . Hyperlipidemia   . Hypertension   . Systemic lupus erythematosus ([place])   possible SLE/rheumatoid arthritis following with Dr.Deveshwar.  . Past Surgical History:  Procedure Laterality Date  . CATARACT EXTRACTION Bilateral 2015  . DILATION AND CURETTAGE OF UTERUS    . DOPPLER ECHOCARDIOGRAPHY  05/2018   Internist to review with pt; potential heart murmur 06/20/18  . SKIN SURGERY  11/30/2018   left side of face    . Social History   Tobacco Use  . Smoking status: Never Smoker  . Smokeless tobacco: Never Used  Substance Use Topics  . Alcohol use: Yes    Comment: rarely   . Drug use: No    ALLERGIES:  is allergic to shellfish allergy.  MEDICATIONS:  Current Outpatient Medications  Medication Sig Dispense Refill  . albuterol (PROVENTIL  HFA;VENTOLIN HFA) 108 (90 Base) MCG/ACT inhaler Inhale 2 puffs into the lungs every 6 (six) hours as needed for wheezing or shortness of breath. 1 Inhaler 2  . Alcohol Swabs (ALCOHOL PADS) 70 % PADS Use as directed to check blood sugars 1 time per day dx: e11.22 150 each 2  . allopurinol (ZYLOPRIM) 100 MG tablet TAKE 1 TABLET BY MOUTH EVERY DAY FOR GOUT 90 tablet 1  . amLODipine (NORVASC) 5 MG tablet Take 1 tablet (5 mg total) by mouth daily. (Patient taking differently: Take 5 mg by mouth daily. 1/2 tab in the am and 1 tab  in the pm) 90 tablet 2  . aspirin EC 81 MG tablet Take 81 mg by mouth daily.    [name] atorvastatin (LIPITOR) 80 MG tablet TAKE 1 TABLET BY MOUTH EVERY DAY 90 tablet 1  . Blood Glucose Monitoring Suppl (ACCU-CHEK AVIVA PLUS) w/Device KIT Use as directed to check blood sugars 1 time per day dx: e11.22 1 kit 1  . budesonide-formoterol (SYMBICORT) 160-4.5 MCG/ACT inhaler INHALE 2 PUFFS BY MOUTH TWICE DAILY IN THE MORNING AND EVENING 10.2 g 2  . Calcium Carb-Cholecalciferol (CALCIUM 600+D) 600-800 MG-UNIT TABS Take by mouth 2 (two) times daily.     . Cholecalciferol (VITAMIN D PO) Take 1,000 Units by mouth daily.     [name] denosumab (PROLIA) 60 MG/ML SOSY injection Inject 60 mg into the skin every 6 (six) months. 1 mL 0  . hydroxychloroquine (PLAQUENIL) 200 MG tablet TAKE 1 TABLET BY MOUTH DAILY FOR RHEUMATOID ARTHRITIS 90 tablet 0  . Hypromellose (ARTIFICIAL TEARS OP) Apply to eye as needed.    . irbesartan (AVAPRO) 300 MG tablet Take 1 tablet (300 mg total) by mouth daily. 90 tablet 2  . metFORMIN (GLUCOPHAGE) 500 MG tablet TAKE 1 TABLET BY MOUTH TWICE DAILY WITH THE MORNING AND EVENING MEAL (Patient taking differently: Take 500 mg by mouth daily with breakfast. 11/09/19 Per Dr. Glendale Chard, patient may decrease Metformin to 1 tablet daily with breakfast) 180 tablet 0  . SYNTHROID 88 MCG tablet TAKE 1 TABLET BY MOUTH EVERY DAY MONDAY TO SATURDAY AND OFF SUNDAYS 90 tablet 0  . Blood Glucose Calibration (ACCU-CHEK AVIVA) SOLN     . glucose blood (ACCU-CHEK AVIVA PLUS) test strip Use as directed to check blood sugars 1 time per day dx: e11.22 150 each 2   No current facility-administered medications for this visit.    PHYSICAL EXAMINATION: ECOG PERFORMANCE STATUS: 1 - Symptomatic but completely ambulatory  Vitals:   12/26/19 1203  BP: (!) 155/64  Pulse: 80  Resp: 18  Temp: 98.5 F (36.9 C)  SpO2: 98%    Filed Weights   12/26/19 1203  Weight: 163 lb (73.9 kg)   .Body mass index is 32.92  kg/m.  GENERAL:alert, in no acute distress and comfortable SKIN: no acute rashes, no significant lesions EYES: conjunctiva are pink and non-injected, sclera anicteric OROPHARYNX: MMM, no exudates, no oropharyngeal erythema or ulceration NECK: supple, no JVD LYMPH:  no palpable lymphadenopathy in the cervical, axillary or inguinal regions LUNGS: clear to auscultation b/l with normal respiratory effort HEART: regular rate & rhythm ABDOMEN:  normoactive bowel sounds , non tender, not distended. No palpable hepatosplenomegaly.  Extremity: no pedal edema PSYCH: alert & oriented x 3 with fluent speech NEURO: no focal motor/sensory deficits  LABORATORY DATA:   I have reviewed the data as listed  . CBC Latest Ref Rng & Units 12/26/2019 12/08/2019 11/14/2019  WBC 4.0 - 10.5  K/uL 5.1 5.5 5.3  Hemoglobin 12.0 - 15.0 g/dL 10.8(L) 10.9(L) 11.2(L)  Hematocrit 36.0 - 46.0 % 33.9(L) 34.2(L) 33.7(L)  Platelets 150 - 400 K/uL 164 163 151    . CMP Latest Ref Rng & Units 12/26/2019 12/08/2019 11/14/2019  Glucose 70 - 99 mg/dL 95 93 90  BUN 8 - 23 mg/dL 17 16 20   Creatinine 0.44 - 1.00 mg/dL 1.06(H) 0.98(H) 1.06(H)  Sodium 135 - 145 mmol/L 139 141 141  Potassium 3.5 - 5.1 mmol/L 4.1 5.0 4.3  Chloride 98 - 111 mmol/L 106 108 104  CO2 22 - 32 mmol/L 25 25 28   Calcium 8.9 - 10.3 mg/dL 8.7(L) 8.5(L) 9.7  Total Protein 6.5 - 8.1 g/dL 7.0 6.6 6.6  Total Bilirubin 0.3 - 1.2 mg/dL 0.7 0.4 0.5  Alkaline Phos 38 - 126 U/L 48 - -  AST 15 - 41 U/L 19 20 19   ALT 0 - 44 U/L 13 19 16   .   Lab Results  Component Value Date   FERRITIN 255 12/26/2019   . Lab Results  Component Value Date   IRON 76 12/26/2019   TIBC 225 (L) 12/26/2019   IRONPCTSAT 34 12/26/2019   (Iron and TIBC)  Lab Results  Component Value Date   FERRITIN 255 12/26/2019          RADIOGRAPHIC STUDIES: I have personally reviewed the radiological images as listed and agreed with the findings in the report. XR KNEE 3 VIEW  LEFT  Result Date: 11/29/2019 Severe lateral compartment narrowing with lateral osteophytes was noted.  Severe patellofemoral narrowing was noted.  Flabella was noted. 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  METASTATIC BONE SURVEY 0/02/2015  COMPARISON: None.  FINDINGS: Heart is normal size. Right diaphragmatic hernia again noted, unchanged. Lungs are clear. No effusions.  No focal lytic lesions within the visualized bony structures or acute bony abnormality. Degenerative changes within the shoulders, lower cervical spine, mid to lower thoracic spine, and lower lumbar spine. Advanced degenerative facet disease in the lower lumbar spine. 9 mm of anterolisthesis of L4 on L5. Endplate sclerosis within L4 and L5. Mild degenerative changes in the hips. Advanced degenerative changes within the knees. 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  IMPRESSION: No focal lytic lesion.  Degenerative joint disease involving multiple joints as described above.  Grade 2 anterolisthesis of L4 on L5 related to facet disease.   ASSESSMENT & PLAN:   74 year old of an American female with  #1 IgG kappa monoclonal gammopathy of undetermined significance. Bone survey X-ray showed no lytic lesions. M spike is 0.6 g/dL in 10/016 and then 0.5g/dl in 10/2015. SPEP in May/2017 and 07/2016 showed M proteinwas stable at 0.3g/dl  MGUS likely related to underlying connective tissue disorder.  Bone marrow biopsy does show about 13% plasma cells however these appear to be polyclonal and likely related to a possible underlying inflammatory disorder. Less likely a biclonal plasma cell dyscrasia.  #2 Normocytic normochromic anemia likely related to chronic inflammation from recent diagnosis of lupus/rheumatoid arthritis + CKD.  Increased acanthocytes on peripheral blood smear -no overt evidence of liver  disease. Could be a marker of some MDS Bone marrow biopsy did not show overt plasma cell dyscrasia or myelodysplastic syndrome. 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 Bone marrow biopsy showed decreased iron stores. Ferritin and iron have been  trending down, she has not noticed any issues with bruising or bleeding recently. ?absorption issue. I have given her the option of IV iron which she has previously had without issue. She would like to have this.   PLAN: -Discussed pt labwork today, 12/26/19; Hgb is stable, blood chemistries are stable  -Discussed 12/26/2019 Iron and TIBC is as follows: all values are WNL except for TIBC at 225 -Discussed 12/26/2019 Ferritin at 255 - Goal Ferritin >100 -Discussed 12/26/2019 MMP is in progress -Will hold IV Iron due to Ferritin being above goal. Will have pt continue PO Iron Polysaccharide once per day.  -Will continue to optimize pt's iron levels and manage chronic inflammation from RA (with Rheumatologist) to improve anemia.  -Advised pt that the loss of color in her fingers appear to be Raynaud's phenomenon -Recommend pt keep hands warm to minimize Raynaud's symptoms  -Will see back in 6 months, with labs 1 week prior   #3 connective tissue disorder possible SLE/rheumatoid arthritis -follows with Dr. Estanislado Pandy. Plan -Continue optimization of her autoimmune disorder treatment with rheumatology Dr. Estanislado Pandy. -She has no symptoms or findings suggestive of multiple myeloma at this time. -will Follow-up on pending labs from today SPEP with [facility] -Continue follow-up with primary care physician and rheumatology.   FOLLOW UP: RTC with Dr Irene Limbo with labs in 6 months. Plz schedule labs 1 week prior to clinic visit.   The total time spent in the appt was 20 minutes and more than 50% was on counseling and direct patient cares.  All of the patient's questions were answered with  apparent satisfaction. The patient knows to call the clinic with any problems, questions or concerns.  Sullivan Lone MD [place] AAHIVMS [facility] Hematology/Oncology Physician [facility]  (Office):       [phone] (Work cell):  [phone] (Fax):           [phone]  I, Yevette Edwards, am acting as a scribe for Dr. Sullivan Lone.   .I have reviewed the above documentation for accuracy and completeness, and I agree with the above. Brunetta Genera MD

## 2019-12-27 ENCOUNTER — Other Ambulatory Visit: Payer: Self-pay | Admitting: Internal Medicine

## 2019-12-28 ENCOUNTER — Telehealth: Payer: Self-pay | Admitting: *Deleted

## 2019-12-28 NOTE — Telephone Encounter (Signed)
-----   Message from Rolland Bimler, RN sent at 12/28/2019 11:18 AM EDT ----- Regarding: Results Left message to contact office ----- Message ----- From: Rolland Bimler, RN Sent: 12/28/2019  11:16 AM EDT To: Rolland Bimler, RN, [facility] Subject: Left message to call office                     ----- Message ----- From: Brunetta Genera, MD Sent: 12/28/2019  12:35 AM EDT To: Rolland Bimler, RN  Plz let patient know ferritin @ 255. She can reduce her Po iron intake to 1 tab po of Iron polysaccharide daily

## 2019-12-28 NOTE — Telephone Encounter (Signed)
Patient returned call - results and Dr. Irene Limbo directions given. Patient verbalized understanding.

## 2019-12-29 ENCOUNTER — Telehealth: Payer: Self-pay | Admitting: Hematology

## 2019-12-29 LAB — MULTIPLE MYELOMA PANEL, SERUM
Albumin SerPl Elph-Mcnc: 3.7 g/dL (ref 2.9–4.4)
Albumin/Glob SerPl: 1.3 (ref 0.7–1.7)
Alpha 1: 0.2 g/dL (ref 0.0–0.4)
Alpha2 Glob SerPl Elph-Mcnc: 0.9 g/dL (ref 0.4–1.0)
B-Globulin SerPl Elph-Mcnc: 0.8 g/dL (ref 0.7–1.3)
Gamma Glob SerPl Elph-Mcnc: 1 g/dL (ref 0.4–1.8)
Globulin, Total: 2.9 g/dL (ref 2.2–3.9)
IgA: 155 mg/dL (ref 64–422)
IgG (Immunoglobin G), Serum: 974 mg/dL (ref 586–1602)
IgM (Immunoglobulin M), Srm: 35 mg/dL (ref 26–217)
Total Protein ELP: 6.6 g/dL (ref 6.0–8.5)

## 2019-12-29 NOTE — Telephone Encounter (Signed)
Scheduled per 03/31 los, called patient and left a voicemail.

## 2020-01-03 DIAGNOSIS — M47814 Spondylosis without myelopathy or radiculopathy, thoracic region: Secondary | ICD-10-CM | POA: Diagnosis not present

## 2020-01-03 DIAGNOSIS — M5137 Other intervertebral disc degeneration, lumbosacral region: Secondary | ICD-10-CM | POA: Diagnosis not present

## 2020-01-03 DIAGNOSIS — M5136 Other intervertebral disc degeneration, lumbar region: Secondary | ICD-10-CM | POA: Diagnosis not present

## 2020-01-03 DIAGNOSIS — M9902 Segmental and somatic dysfunction of thoracic region: Secondary | ICD-10-CM | POA: Diagnosis not present

## 2020-01-03 DIAGNOSIS — M9905 Segmental and somatic dysfunction of pelvic region: Secondary | ICD-10-CM | POA: Diagnosis not present

## 2020-01-03 DIAGNOSIS — M9903 Segmental and somatic dysfunction of lumbar region: Secondary | ICD-10-CM | POA: Diagnosis not present

## 2020-01-11 ENCOUNTER — Telehealth: Payer: Self-pay | Admitting: Hematology

## 2020-01-11 NOTE — Telephone Encounter (Signed)
Scheduled per 03/30 los, patient has been called and voicemail was left.

## 2020-01-26 ENCOUNTER — Telehealth: Payer: Self-pay | Admitting: Internal Medicine

## 2020-01-26 NOTE — Chronic Care Management (AMB) (Signed)
  Care Management   Note  01/26/2020 Name: [name] MRN: [id number] DOB: 1946-04-19  Hannah [name] is a 74 y.o. year old female who is a primary care patient of Glendale Chard, MD and is actively engaged with the care management team. I reached out to Hannah [name] by phone today to assist with scheduling an initial visit with the Pharmacist  Follow up plan: Unsuccessful telephone outreach attempt made. A HIPPA compliant phone message was left for the patient providing contact information and requesting a return call. The care management team will reach out to the patient again over the next 7 days. If patient returns call to provider office, please advise to call [place] at [phone].  [place],  [id number] Direct Dial: [phone] [email or web address]

## 2020-01-29 NOTE — Chronic Care Management (AMB) (Signed)
  Care Management   Note  01/29/2020 Name: Hannah [name] MRN: [id number] DOB: 12-Oct-1945  Hannah [name] is a 74 y.o. year old female who is a primary care patient of Glendale Chard, MD and is actively engaged with the care management team. I reached out to Hannah [name] by phone today to assist with scheduling an initial visit with the Pharmacist  Follow up plan: Face to Face appointment with care management team member scheduled for: 6/3/021.  [place], [place] [address] Direct Dial: [phone] [email or web address]: [email or web address]

## 2020-01-30 ENCOUNTER — Ambulatory Visit: Payer: Medicare HMO | Admitting: Physician Assistant

## 2020-01-31 DIAGNOSIS — M5137 Other intervertebral disc degeneration, lumbosacral region: Secondary | ICD-10-CM | POA: Diagnosis not present

## 2020-01-31 DIAGNOSIS — M5136 Other intervertebral disc degeneration, lumbar region: Secondary | ICD-10-CM | POA: Diagnosis not present

## 2020-01-31 DIAGNOSIS — M47814 Spondylosis without myelopathy or radiculopathy, thoracic region: Secondary | ICD-10-CM | POA: Diagnosis not present

## 2020-01-31 DIAGNOSIS — M9902 Segmental and somatic dysfunction of thoracic region: Secondary | ICD-10-CM | POA: Diagnosis not present

## 2020-01-31 DIAGNOSIS — M9903 Segmental and somatic dysfunction of lumbar region: Secondary | ICD-10-CM | POA: Diagnosis not present

## 2020-01-31 DIAGNOSIS — M9905 Segmental and somatic dysfunction of pelvic region: Secondary | ICD-10-CM | POA: Diagnosis not present

## 2020-02-06 ENCOUNTER — Other Ambulatory Visit: Payer: Self-pay

## 2020-02-06 ENCOUNTER — Telehealth: Payer: Self-pay | Admitting: Physician Assistant

## 2020-02-06 ENCOUNTER — Encounter: Payer: Self-pay | Admitting: Physician Assistant

## 2020-02-06 ENCOUNTER — Ambulatory Visit: Payer: Medicare HMO | Admitting: Physician Assistant

## 2020-02-06 DIAGNOSIS — D485 Neoplasm of uncertain behavior of skin: Secondary | ICD-10-CM

## 2020-02-06 DIAGNOSIS — L80 Vitiligo: Secondary | ICD-10-CM

## 2020-02-06 DIAGNOSIS — L820 Inflamed seborrheic keratosis: Secondary | ICD-10-CM | POA: Diagnosis not present

## 2020-02-06 MED ORDER — TACROLIMUS 0.1 % EX OINT: TOPICAL_OINTMENT | Freq: Every day | CUTANEOUS | 2 refills | Status: DC

## 2020-02-06 MED ORDER — TACROLIMUS 0.1 % EX OINT: TOPICAL_OINTMENT | Freq: Every day | CUTANEOUS | 6 refills | Status: DC

## 2020-02-06 NOTE — Patient Instructions (Signed)

## 2020-02-06 NOTE — Addendum Note (Signed)
Addended by: Robyne Askew R on: 02/06/2020 10:39 AM   Modules accepted: Orders

## 2020-02-06 NOTE — Telephone Encounter (Signed)
Patient calling about medication rx'd this morning for vitiligo. Patient states that it is almost $100 and the nurse was looking on good rx while she was in the office. She would like to know if good rx shows another pharmacy she can get medication from at a cheaper price?

## 2020-02-06 NOTE — Telephone Encounter (Signed)
Informed patient I will sent Tacrolimus ointment to Hannah [name] on [address] with good rx card.

## 2020-02-06 NOTE — Progress Notes (Signed)
   Follow-Up Visit   Subjective  Hannah [name] is a 74 y.o. female who presents for the following: Skin Problem (Here this morning because she has noticed two places on right foot (dorsal) with hypopigmentation. Similar place on her left thigh and around her nose. She wants to make sure that it is nothing serious.).   The following portions of the chart were reviewed this encounter and updated as appropriate: Tobacco  Allergies  Meds  Problems  Med Hx  Surg Hx  Fam Hx      Objective  Well appearing patient in no apparent distress; mood and affect are within normal limits.  A full examination was performed including scalp, head, eyes, ears, nose, lips, neck, chest, axillae, abdomen, back, buttocks, bilateral upper extremities, bilateral lower extremities, hands, feet, fingers, toes, fingernails, and toenails. All findings within normal limits unless otherwise noted below.  Objective  Right Occipital Scalp: Brown crust  Objective  Left Shoulder - Posterior: Brown crust  Objective  Dorsum of Nose, Mid Tip of Nose, Right Dorsum of Foot (2): hypopigmentation   Assessment & Plan  Neoplasm of uncertain behavior of skin (2) Right Occipital Scalp  Skin / nail biopsy Type of biopsy: tangential   Informed consent: discussed and consent obtained   Timeout: patient name, date of birth, surgical site, and procedure verified   Anesthesia: the lesion was anesthetized in a standard fashion   Anesthetic:  1% lidocaine w/ epinephrine 1-100,000 local infiltration Instrument used: flexible razor blade   Hemostasis achieved with: aluminum chloride and electrodesiccation   Outcome: patient tolerated procedure well   Post-procedure details: wound care instructions given    Specimen 1 - Surgical pathology Differential Diagnosis: ISK Check Margins: No  Left Shoulder - Posterior  Skin / nail biopsy Type of biopsy: tangential   Informed consent: discussed and consent obtained    Timeout: patient name, date of birth, surgical site, and procedure verified   Anesthesia: the lesion was anesthetized in a standard fashion   Anesthetic:  1% lidocaine w/ epinephrine 1-100,000 local infiltration Instrument used: flexible razor blade   Hemostasis achieved with: aluminum chloride and electrodesiccation   Outcome: patient tolerated procedure well   Post-procedure details: wound care instructions given    Specimen 2 - Surgical pathology Differential Diagnosis: ISK Check Margins: No  Vitiligo (4) Right Dorsum of Foot (2); Dorsum of Nose; Mid Tip of Nose  protopic

## 2020-02-07 ENCOUNTER — Telehealth: Payer: Self-pay | Admitting: *Deleted

## 2020-02-07 ENCOUNTER — Other Ambulatory Visit: Payer: Self-pay | Admitting: Internal Medicine

## 2020-02-07 NOTE — Telephone Encounter (Signed)
Patient left message on office voice mail saying that [facility] called her to say that they needed more information on the Tacrolimus prescription. [facility] uses [facility] at the corner of [address].

## 2020-02-07 NOTE — Telephone Encounter (Signed)
Left message for patient to inform her that I called and gave the pharmacy the good rx card for the tacrolimus ointment 30 gm. Member ID [id number], [place], BIN [id number], PCN [id number].

## 2020-02-07 NOTE — Telephone Encounter (Signed)
Patient just came by office and gave me a paper stating what the pharmacy told her that they needed from [place]. -The Good RX card -Member ID # -Group # -BIN # -PCN ID  If information given to the pharmacy, then patient will be able to get the prescription on Friday.  Kristopher Oppenheim [phone].  Chart # [id number]

## 2020-02-08 ENCOUNTER — Telehealth: Payer: Self-pay

## 2020-02-20 ENCOUNTER — Other Ambulatory Visit: Payer: Self-pay | Admitting: Internal Medicine

## 2020-02-21 ENCOUNTER — Other Ambulatory Visit: Payer: Self-pay

## 2020-02-21 MED ORDER — AMLODIPINE BESYLATE 5 MG PO TABS: 5.0000 mg | ORAL_TABLET | Freq: Every day | ORAL | 1 refills | Status: DC

## 2020-02-28 DIAGNOSIS — M9905 Segmental and somatic dysfunction of pelvic region: Secondary | ICD-10-CM | POA: Diagnosis not present

## 2020-02-28 DIAGNOSIS — M9902 Segmental and somatic dysfunction of thoracic region: Secondary | ICD-10-CM | POA: Diagnosis not present

## 2020-02-28 DIAGNOSIS — M5136 Other intervertebral disc degeneration, lumbar region: Secondary | ICD-10-CM | POA: Diagnosis not present

## 2020-02-28 DIAGNOSIS — M5137 Other intervertebral disc degeneration, lumbosacral region: Secondary | ICD-10-CM | POA: Diagnosis not present

## 2020-02-28 DIAGNOSIS — M47814 Spondylosis without myelopathy or radiculopathy, thoracic region: Secondary | ICD-10-CM | POA: Diagnosis not present

## 2020-02-28 DIAGNOSIS — M9903 Segmental and somatic dysfunction of lumbar region: Secondary | ICD-10-CM | POA: Diagnosis not present

## 2020-02-29 ENCOUNTER — Other Ambulatory Visit: Payer: Self-pay

## 2020-02-29 ENCOUNTER — Ambulatory Visit: Payer: Medicare HMO | Admitting: Internal Medicine

## 2020-02-29 ENCOUNTER — Ambulatory Visit: Payer: Medicare HMO

## 2020-02-29 DIAGNOSIS — E1122 Type 2 diabetes mellitus with diabetic chronic kidney disease: Secondary | ICD-10-CM

## 2020-02-29 DIAGNOSIS — I129 Hypertensive chronic kidney disease with stage 1 through stage 4 chronic kidney disease, or unspecified chronic kidney disease: Secondary | ICD-10-CM

## 2020-02-29 DIAGNOSIS — E039 Hypothyroidism, unspecified: Secondary | ICD-10-CM

## 2020-02-29 DIAGNOSIS — N183 Chronic kidney disease, stage 3 unspecified: Secondary | ICD-10-CM

## 2020-02-29 NOTE — Chronic Care Management (AMB) (Signed)
Chronic Care Management Pharmacy  Name: Hannah [name]  MRN: [id number] DOB: 27-Jun-1946  Chief Complaint/ HPI  Hannah [name],  74 y.o. , female presents for their Initial CCM visit with the clinical pharmacist In office.  PCP : Hannah Chard, MD  Their chronic conditions include: Hypertension, Rheumatoid arthritis, Systemic lupus erythematosus. Hypothyroidism, Type 2 diabetes mellitus, Gout, Anemia, Osteoporosis, Osteoarthritis   Office Visits: 10/31/19 OV: Presented for full physical exam and diabetes and hypertension follow up. Reported compliance with medications. Complained of eyelid swelling and midback pain. Diabetic foot exam performed. Labs ordered (HgbA1c, CBC, CMP14+EGFR, Lipid panel, UA, UA-microalbumin). EKG performed, no new changes noted. Advised to perform stretches and take magnesium nightly for back pain. Advised pt to use small amount of Preparation H on lower eyelids 2-3 times per week for gland swelling.   Consult Visits: 02/06/20 Dermatology OV w/ Hannah [name]: Biopsy performed for neoplasm and vitiligo. Rx for tacrolimus ointment sent to pharmacy.   12/26/19 Oncology OV w/ Hannah [name]: Presented or follow up of MGUS and anemia. Pt reported she began taking iron polysaccharide BID after rheumatology office visit when Hgb was close to 10. Reduce iron polysaccharide back to once daily as Ferritin was 225. Follow up in 6 months.   11/29/19 Rheumatology OV w/ Hannah [name]: Doing well on Plaquenil for positive RF, positive anti-CCP rheumatoid arthritis and SLE. Pt on Prolia injections every 6 months for osteoporosis. Prolia injected today.   09/05/19 OV Gynecology w/ Dr. Dellis [name]: Presented for annual gynecological exam in menopause. Pap reflex done. Mammogram 2020 negative. Colonoscopy 2014. No hormone replacement. Followed by Endocrinology for osteoporosis. Recommended low calorie/carb diet and increase in physical activity and light weight lifting.   CCM  Encounters: 3/17 21 PharmD: Tier exception for [facility] denied. Samples given for Synthroid 31mg.   12/12/19 RN: Evaluation of current treatment plans. Discussed knee pain and process for orthopedics referral if desired.   11/09/19 RN: Per HannahSanders, pt may decrease metformin to '500mg'$  once daily with breakfast. CKD treatment plan evaluated.   11/07/19 RN: Evaluation of current treatment plans. Pt education provided.    09/04/19 PharmD: Tier exception for Synthroid denied. Will call for appeal process.   08/30/19 PharmD: Comprehensive medication review performed. Counseled on use of rescue albuterol and Symbicort. Patient paying $44/month for brand name Synthroid. Pt doesn't wish to participate in Synthroid Delivers due to apt [place] [facility] [facility]   Medications: Outpatient Encounter Medications as of 02/29/2020  Medication Sig  . albuterol (PROVENTIL HFA;VENTOLIN HFA) 108 (90 Base) MCG/ACT inhaler Inhale 2 puffs into the lungs every 6 (six) hours as needed for wheezing or shortness of breath.  . Alcohol Swabs (ALCOHOL PADS) 70 % PADS Use as directed to check blood sugars 1 time per day dx: e11.22  . allopurinol (ZYLOPRIM) 100 MG tablet TAKE 1 TABLET BY MOUTH EVERY DAY FOR GOUT  . amLODipine (NORVASC) 2.5 MG tablet Take 1 tablet (2.5 mg total) by mouth daily. (Patient taking differently: Take 2.5 mg by mouth daily. In the morning)  . amLODipine (NORVASC) 5 MG tablet Take 1 tablet (5 mg total) by mouth daily. 1/2 tab in the am and 1 tab in the pm (Patient taking differently: Take 5 mg by mouth daily. In the evening)  . aspirin EC 81 MG tablet Take 81 mg by mouth daily.  .[name] Kitchenatorvastatin (LIPITOR) 80 MG tablet TAKE 1 TABLET BY MOUTH EVERY DAY  .  Blood Glucose Calibration (ACCU-CHEK AVIVA) SOLN   . Blood Glucose Monitoring Suppl (ACCU-CHEK AVIVA PLUS) w/Device KIT Use as directed to check blood sugars 1 time per day dx: e11.22  .  budesonide-formoterol (SYMBICORT) 160-4.5 MCG/ACT inhaler INHALE 2 PUFFS BY MOUTH TWICE DAILY IN THE MORNING AND EVENING  . Calcium Carb-Cholecalciferol (CALCIUM 600+D) 600-800 MG-UNIT TABS Take 1 tablet by mouth daily.   . Cholecalciferol (VITAMIN D PO) Take 2,000 Units by mouth daily.   [name] denosumab (PROLIA) 60 MG/ML SOSY injection Inject 60 mg into the skin every 6 (six) months.  . hydroxychloroquine (PLAQUENIL) 200 MG tablet TAKE 1 TABLET BY MOUTH DAILY FOR RHEUMATOID ARTHRITIS  . irbesartan (AVAPRO) 300 MG tablet Take 1 tablet (300 mg total) by mouth daily.  . metFORMIN (GLUCOPHAGE) 500 MG tablet Take 1 tablet (500 mg total) by mouth daily with breakfast.  . SYNTHROID 88 MCG tablet TAKE 1 TABLET BY MOUTH EVERY DAY MONDAY TO SATURDAY AND OFF ON SUNDAYS  . tacrolimus (PROTOPIC) 0.1 % ointment Apply topically daily.  . Hypromellose (ARTIFICIAL TEARS OP) Apply to eye as needed.  . [DISCONTINUED] hydroxychloroquine (PLAQUENIL) 200 MG tablet TAKE 1 TABLET BY MOUTH DAILY FOR RHEUMATOID ARTHRITIS  . [DISCONTINUED] tacrolimus (PROTOPIC) 0.1 % ointment Apply topically daily.   No facility-administered encounter medications on file as of 02/29/2020.    Current Diagnosis/Assessment:  SDOH Interventions     Most Recent Value  SDOH Interventions  Financial Strain Interventions --  [Will review patient assistance options for Synthroid]      Goals Addressed            This Visit's Progress   . Pharmacy Care Plan       CARE PLAN ENTRY (see longitudinal plan of care for additional care plan information)  Current Barriers:  . Chronic Disease Management support, education, and care coordination needs related to Hypertension, Hyperlipidemia, Diabetes, Asthma, Hypothyroidism, and Osteoporosis   Hypertension BP Readings from Last 3 Encounters:  03/20/20 (!) 160/78  12/26/19 (!) 155/64  11/29/19 (!) 144/73   . Pharmacist Clinical Goal(s): o Over the next 90 days, patient will work with PharmD  and providers to achieve BP goal <130/80 . Current regimen:  o Amlodipine 2.52m every morning and 575mevery evening o Irbesartan 30077maily . Interventions: o Contacted [employer] regarding coverage of amlodipine 2.5mg47md 5mg 72multaneously . Patient self care activities - Over the next 14 days, patient will: o Check BP daily, document, and provide at future appointments o Ensure daily salt intake < 2300 mg/day  Hyperlipidemia Lab Results  Component Value Date/Time   LDLCALC 53 10/31/2019 04:35 PM   . Pharmacist Clinical Goal(s): o Over the next 90 days, patient will work with PharmD and providers to maintain LDL goal < 70 . Current regimen:  o Atorvastatin 80 mg daily . Interventions: o Provided dietary and exercise recommendations . Patient self care activities - Over the next 90 days, patient will: o Take cholesterol medication daily as directed  Diabetes Lab Results  Component Value Date/Time   HGBA1C 5.7 (H) 03/20/2020 04:45 PM   HGBA1C 5.6 10/31/2019 04:35 PM   . Pharmacist Clinical Goal(s): o Over the next 90 days, patient will work with PharmD and providers to maintain A1c goal <7% . Current regimen:  o Metformin 500mg 2my . Interventions: o Provided dietary and exercise recommendations . Patient self care activities - Over the next 90 days, patient will: o Check blood sugar several times per week, document, and provide at  future appointments o Contact provider with any episodes of hypoglycemia  Hypothyroidism . Pharmacist Clinical Goal(s) o Over the next 90 days, patient will work with PharmD and providers to obtain Synthroid medication . Current regimen:  o Synthroid 55mg daily Monday through Saturday, skip Sundays . Interventions: o Review alternative assistance options for Synthroid . Patient self care activities - Over the next 90 days, patient will: o Continue taking Synthroid daily as directed  Medication management . Pharmacist Clinical  Goal(s): o Over the next 90 days, patient will work with PharmD and providers to achieve optimal medication adherence . Current pharmacy: [facility] . Interventions o Comprehensive medication review performed. o Continue current medication management strategy . Patient self care activities - Over the next 90 days, patient will: o Focus on medication adherence by considering use of pill box to add in organization of medication o Take medications as prescribed o Report any questions or concerns to PharmD and/or provider(s)  Initial goal documentation        Diabetes   Recent Relevant Labs: Lab Results  Component Value Date/Time   HGBA1C 5.7 (H) 03/20/2020 04:45 PM   HGBA1C 5.6 10/31/2019 04:35 PM   EGFR 44 (L) 08/12/2017 08:08 AM   EGFR 50 (L) 02/16/2017 08:29 AM   MICROALBUR 80 10/31/2019 12:13 PM   MICROALBUR 80 06/15/2019 06:11 PM    Kidney Function Lab Results  Component Value Date/Time   CREATININE 1.06 (H) 12/26/2019 11:02 AM   CREATININE 0.98 (H) 12/08/2019 02:12 PM   CREATININE 1.06 (H) 11/14/2019 01:17 PM   CREATININE 1.4 (H) 08/12/2017 08:08 AM   CREATININE 1.2 (H) 02/16/2017 08:29 AM   GFRNONAA 52 (L) 12/26/2019 11:02 AM   GFRNONAA 57 (L) 12/08/2019 02:12 PM   GFRAA 60 (L) 12/26/2019 11:02 AM   GFRAA 66 12/08/2019 02:12 PM   Checking BG: Three times weekly  Recent FBG Readings: 838,88,75,79Recent pre-meal BG readings:  Recent 2hr PP BG readings:   Recent HS BG readings:  Patient has failed these meds in past: N/A Patient is currently controlled on the following medications:   Metformin '500mg'$  daily with breakfast  Last diabetic Foot exam: 10/31/19 Last diabetic Eye exam: Lab Results  Component Value Date/Time   HMDIABEYEEXA No Retinopathy 08/17/2019 08:58 AM   We discussed:   Diet extensively  Pt reports she has gained some weight during [place] [employer] again  Trying to increase vegetable intake  Eats a lot of salads,  light on the dressing  Trying to limit starches  Says that her downfall is sweets, she tries to just not buy them   No soda or coffee  Drinks at least 8 glasses of water daily  Exercise extensively  Pt currently does 2 days of yoga and 2 days of strength training classes per week (45 minutes each class)  Does some walking, but not a lot  She also reports doing exercises at home  Congratulated pt on current activity level and encouraged her to continue with a minimum goal of 30 minutes 5 times per week  Recommended pt alternate the times that she checks her blood sugar   Plan Continue current medications    Hypertension   Office blood pressures are  BP Readings from Last 3 Encounters:  03/20/20 (!) 160/78  12/26/19 (!) 155/64  11/29/19 (!) 144/73    Patient has failed these meds in the past: Azilsartan, furosemide, Edarbyclor, olmesartan/HCTZ, valsartan/HCTZ, valsartan Patient is currently uncontrolled on the following medications:  Amlodipine '5mg'$  1/2 tablet in the morning, 1 tablet in the evening  Irbesartan '300mg'$  daily  Patient checks BP at home 2-3x per week  Patient home BP readings are ranging: Could not provide  We discussed:  Diet extensively  Tries not to add extra salt to food  Exercise extensively  Recommend pt check BP everyday for 2 weeks until PCP apt  Pt expressed concern about having to cut amlodipine '5mg'$  tablet in half because it is so small and round making it difficult to cut. She previously had a prescription for 2.'5mg'$  and would prefer to take a 2.'5mg'$  tablet in the morning and a '5mg'$  tablet in the evening  Discussed that insurance may not pay for 2 strengths of amlodipine at the same time, but I will call [employer] to determine coverage  Plan Continue current medications  Contact [employer] to determine if they will cover amlodipine 2.'5mg'$  and amlodipine '5mg'$  at the same time.    Hyperlipidemia   Lipid Panel     Component Value Date/Time    CHOL 134 10/31/2019 1635   TRIG 70 10/31/2019 1635   HDL 67 10/31/2019 1635   LDLCALC 53 10/31/2019 1635     The 10-year ASCVD risk score Mikey Bussing DC Jr., et al., 2013) is: 32.8%   Values used to calculate the score:     Age: 75 years     Sex: Female     Is Non-Hispanic African American: Yes     Diabetic: Yes     Tobacco smoker: No     Systolic Blood Pressure: 759 mmHg     Is BP treated: Yes     HDL Cholesterol: 67 mg/dL     Total Cholesterol: 134 mg/dL   Patient has failed these meds in past: N/A Patient is currently controlled on the following medications:   Atorvastatin '80mg'$  daily  Aspirin '81mg'$  daily  We discussed:   Diet and exercise extensively  Plan Continue current medications   COPD / Asthma / Tobacco   Last spirometry score: 03/22/15 FEV1 1.02 (42%)  Gold Grade: Gold 1 (FEV1>80%), last spirometry would suggest more severe grade, but pt states significant symptoms improvement since moving to [place] Current COPD Classification:  A (low sx, <2 exacerbations/yr)  Eosinophil count:   Lab Results  Component Value Date/Time   EOSPCT 11 12/26/2019 11:02 AM   EOSPCT 9.2 (H) 08/12/2017 08:08 AM  %                               Eos (Absolute):  Lab Results  Component Value Date/Time   EOSABS 0.6 (H) 12/26/2019 11:02 AM   EOSABS 0.4 08/12/2017 08:08 AM   Tobacco Status:  Social History   Tobacco Use  Smoking Status Never Smoker  Smokeless Tobacco Never Used   Patient has failed these meds in past: Theophylline, Duoneb, Advair, Zafirlukast Patient is currently controlled on the following medications:   Symbicort 160/4.87mg/act 2 puffs twice daily  Albuterol 2 puffs every 6 hours as needed Using maintenance inhaler regularly? No Frequency of rescue inhaler use:  infrequently  We discussed:    Pt was previously on a lot of asthma medications before moving to [place]  She reports occasional SOB  Uses Symbicort about every 2 months (like on a humid day)  Uses  albuterol every 4-5 months  Does not report needing inhalers after exercising  Inhalers are expensive, but she does not get them filled very  often  Plan Continue current medications  Hypothyroidism   Lab Results  Component Value Date/Time   TSH 1.140 03/20/2020 04:45 PM   TSH 0.498 10/31/2019 04:39 PM   TSH 0.740 06/15/2019 12:22 PM    Patient has failed these meds in past: generic levothyroxine Patient is currently controlled on the following medications:   Synthroid 43mg daily Monday through Saturday, skip _0  - 29 ng/mL Optimal:                 > or = 30 ng/mL . For 25-OH Vitamin D testing on patients on  D2-supplementation and patients for whom quantitation  of D2 and D3 fractions is required, the [facility]ureD(TM) 25-OH VIT D, (D2,D3), LC/MS/MS is recommended: order  code [id number](patients >236yr. See Note 1 . Note 1 . For additional information, please refer to  http://education.[facility].com/faq/FAQ199  (This link is being provided for informational/ educational purposes only.)      Patient is a candidate for pharmacologic treatment due to T-Score < -2.5 in femoral neck  Patient has failed these meds in past: Fosamax Patient is currently controlled on the following medications:   Prolia injection 6071mvery 6 months  Cholecalciferol 2000 units daily  Calcium carbonate/cholecalciferol 600m45m0units daily  We discussed:  Recommend weight-bearing and muscle strengthening exercises for building and maintaining bone density.  Plan Continue current medications   Anemia   Patient is currently controlled on the following medications:   Iron polysaccharide 150mg34me daily  We discussed:   Pt has been anemic for many years, family history of anemia  Has required several iron infusion over the last 3 years  Plan Continue current medications  Gout   Patient has failed these meds in past: N/A Patient is currently controlled on the following medications:  -Allopurinol 100mg 69my  We discussed:  Pt has not had a gout flare in 3-4 years  Plan Continue current medications  Vitiligo   Patient has failed these meds in past: N/A Patient is currently controlled on the following medications:   Tacrolimus 0.1% ointment applied daily  We discussed:    Newly diagnosed vitiligo is very concerning to pt  Considering getting a second opinion  Plan Continue current  medications  Vaccines   Reviewed and discussed patient's vaccination history.    Immunization History  Administered Date(s) Administered  . 19-influenza Whole 07/15/2012  . DTaP 06/18/2019  . Influenza Split 06/28/2014  . Influenza, High Dose Seasonal PF 05/18/2019  . Influenza,inj,Quad PF,6+ Mos 06/20/2018  . Moderna SARS-COVID-2 Vaccination 11/20/2019, 12/19/2019  . Pneumococcal Conjugate-13 05/04/2018  . Pneumococcal Polysaccharide-23 05/18/2019  . Pneumococcal-Unspecified 06/28/2014  . Tdap 06/15/2019    Plan Discuss Shingrix at follow up visit  Medication Management   Pt uses [facility] for all medications Uses pill box? No - Only uses when traveling Pt endorses 100% compliance  We discussed:   Importance of taking medications daily as directed  Plan Continue current medication management strategy    Follow up: 6 week phone visit  Jannette Fogo, PharmD Clinical Pharmacist [facility] [phone]

## 2020-03-02 ENCOUNTER — Other Ambulatory Visit: Payer: Self-pay

## 2020-03-02 MED ORDER — AMLODIPINE BESYLATE 2.5 MG PO TABS
2.5000 mg | ORAL_TABLET | Freq: Every day | ORAL | 11 refills | Status: DC
Start: 2020-03-02 — End: 2020-06-19

## 2020-03-04 ENCOUNTER — Other Ambulatory Visit: Payer: Self-pay | Admitting: Internal Medicine

## 2020-03-04 DIAGNOSIS — E1122 Type 2 diabetes mellitus with diabetic chronic kidney disease: Secondary | ICD-10-CM | POA: Diagnosis not present

## 2020-03-04 DIAGNOSIS — N183 Chronic kidney disease, stage 3 unspecified: Secondary | ICD-10-CM | POA: Diagnosis not present

## 2020-03-04 DIAGNOSIS — N2581 Secondary hyperparathyroidism of renal origin: Secondary | ICD-10-CM | POA: Diagnosis not present

## 2020-03-04 DIAGNOSIS — D472 Monoclonal gammopathy: Secondary | ICD-10-CM | POA: Diagnosis not present

## 2020-03-04 DIAGNOSIS — M329 Systemic lupus erythematosus, unspecified: Secondary | ICD-10-CM | POA: Diagnosis not present

## 2020-03-04 DIAGNOSIS — D631 Anemia in chronic kidney disease: Secondary | ICD-10-CM | POA: Diagnosis not present

## 2020-03-04 DIAGNOSIS — I129 Hypertensive chronic kidney disease with stage 1 through stage 4 chronic kidney disease, or unspecified chronic kidney disease: Secondary | ICD-10-CM | POA: Diagnosis not present

## 2020-03-05 DIAGNOSIS — Z1231 Encounter for screening mammogram for malignant neoplasm of breast: Secondary | ICD-10-CM | POA: Diagnosis not present

## 2020-03-05 LAB — HM MAMMOGRAPHY

## 2020-03-06 ENCOUNTER — Telehealth: Payer: Self-pay

## 2020-03-06 ENCOUNTER — Other Ambulatory Visit: Payer: Self-pay

## 2020-03-06 ENCOUNTER — Ambulatory Visit (INDEPENDENT_AMBULATORY_CARE_PROVIDER_SITE_OTHER): Payer: Medicare HMO

## 2020-03-06 DIAGNOSIS — E039 Hypothyroidism, unspecified: Secondary | ICD-10-CM | POA: Diagnosis not present

## 2020-03-06 DIAGNOSIS — I129 Hypertensive chronic kidney disease with stage 1 through stage 4 chronic kidney disease, or unspecified chronic kidney disease: Secondary | ICD-10-CM | POA: Diagnosis not present

## 2020-03-06 DIAGNOSIS — N183 Chronic kidney disease, stage 3 unspecified: Secondary | ICD-10-CM

## 2020-03-06 DIAGNOSIS — E1122 Type 2 diabetes mellitus with diabetic chronic kidney disease: Secondary | ICD-10-CM | POA: Diagnosis not present

## 2020-03-06 DIAGNOSIS — M0579 Rheumatoid arthritis with rheumatoid factor of multiple sites without organ or systems involvement: Secondary | ICD-10-CM | POA: Diagnosis not present

## 2020-03-07 ENCOUNTER — Encounter: Payer: Self-pay | Admitting: Internal Medicine

## 2020-03-11 NOTE — Chronic Care Management (AMB) (Signed)
Chronic Care Management   Follow Up Note   03/06/2020 Name: Hannah [name] MRN: [id number] DOB: 05-12-1946  Referred by: Hannah Chard, MD Reason for referral : Chronic Care Management (FU Call-CKD, RA, Anemia)   Hannah [name] is a 74 y.o. year old female who is a primary care patient of Hannah Chard, MD. The CCM team was consulted for assistance with chronic disease management and care coordination needs.    Review of patient status, including review of consultants reports, relevant laboratory and other test results, and collaboration with appropriate care team members and the patient's provider was performed as part of comprehensive patient evaluation and provision of chronic care management services.    SDOH (Social Determinants of Health) assessments performed: Yes - no acute needs identified See Care Plan activities for detailed interventions related to [place])   Placed outbound follow up call to patient for a CCM RN CM care plan update.     Outpatient Encounter Medications as of 03/06/2020  Medication Sig  . amLODipine (NORVASC) 2.5 MG tablet Take 1 tablet (2.5 mg total) by mouth daily.  [name] amLODipine (NORVASC) 5 MG tablet Take 1 tablet (5 mg total) by mouth daily. 1/2 tab in the am and 1 tab in the pm  . hydroxychloroquine (PLAQUENIL) 200 MG tablet TAKE 1 TABLET BY MOUTH DAILY FOR RHEUMATOID ARTHRITIS  . Hypromellose (ARTIFICIAL TEARS OP) Apply to eye as needed.  . irbesartan (AVAPRO) 300 MG tablet Take 1 tablet (300 mg total) by mouth daily.  [name] albuterol (PROVENTIL HFA;VENTOLIN HFA) 108 (90 Base) MCG/ACT inhaler Inhale 2 puffs into the lungs every 6 (six) hours as needed for wheezing or shortness of breath.  . Alcohol Swabs (ALCOHOL PADS) 70 % PADS Use as directed to check blood sugars 1 time per day dx: e11.22  . allopurinol (ZYLOPRIM) 100 MG tablet TAKE 1 TABLET BY MOUTH EVERY DAY FOR GOUT  . aspirin EC 81 MG tablet Take 81 mg by mouth daily.  [name] atorvastatin  (LIPITOR) 80 MG tablet TAKE 1 TABLET BY MOUTH EVERY DAY  . Blood Glucose Calibration (ACCU-CHEK AVIVA) SOLN   . Blood Glucose Monitoring Suppl (ACCU-CHEK AVIVA PLUS) w/Device KIT Use as directed to check blood sugars 1 time per day dx: e11.22  . budesonide-formoterol (SYMBICORT) 160-4.5 MCG/ACT inhaler INHALE 2 PUFFS BY MOUTH TWICE DAILY IN THE MORNING AND EVENING  . Calcium Carb-Cholecalciferol (CALCIUM 600+D) 600-800 MG-UNIT TABS Take by mouth 2 (two) times daily.   . Cholecalciferol (VITAMIN D PO) Take 1,000 Units by mouth daily.   [name] denosumab (PROLIA) 60 MG/ML SOSY injection Inject 60 mg into the skin every 6 (six) months.  . metFORMIN (GLUCOPHAGE) 500 MG tablet Take 1 tablet (500 mg total) by mouth daily with breakfast.  . SYNTHROID 88 MCG tablet TAKE 1 TABLET BY MOUTH EVERY DAY MONDAY TO SATURDAY AND OFF ON SUNDAYS  . tacrolimus (PROTOPIC) 0.1 % ointment Apply topically daily.  . tacrolimus (PROTOPIC) 0.1 % ointment Apply topically daily.   No facility-administered encounter medications on file as of 03/06/2020.     Objective:  Lab Results  Component Value Date   HGBA1C 5.6 10/31/2019   HGBA1C 5.6 06/15/2019   HGBA1C 5.7 (H) 02/23/2019   Lab Results  Component Value Date   MICROALBUR 80 10/31/2019   LDLCALC 53 10/31/2019   CREATININE 1.06 (H) 12/26/2019   BP Readings from Last 3 Encounters:  12/26/19 (!) 155/64  11/29/19 (!) 144/73  10/31/19 (!) 144/90    Goals Addressed  Patient Stated   .  "I want to keep my RA under good control" (pt-stated)        Current Barriers:  [name] Knowledge Deficits related to disease process and Self Health management of RA . Chronic Disease Management support and education needs related to RA, DM, Hypertensive Neuropathy, CKDIII  Nurse Case Manager Clinical Goal(s):  [name] Over the next 90 days, patient will work with CCM RN CM to address needs related to disease education and support for RA . Over the next 90 days, the patient will  demonstrate ongoing self health care management ability as evidenced by patient will report having no RA exacerbations *  CCM RN CM Interventions:  03/06/20 call completed with patient . Evaluation of current treatment plan related to RA and patient's adherence to plan as established by provider . Discussed and reviewed the following Assessment/Plan per Dr. Estanislado [name] following Rheumatology f/u visit completed on 12/06/19;  o Assessment / Plan:     o Visit Diagnoses: Rheumatoid arthritis involving multiple sites with positive rheumatoid factor ([place]) - Positive RF, positive anti-CCP.  Patient has no active synovitis.  She has been doing well on Plaquenil. o Other systemic lupus erythematosus with other organ involvement ([place]) - Positive ANA, positive Smith, positive RNP.  Her most recent labs did not show active autoimmune disease.  She has been tolerating Plaquenil well. o High risk medication use - Plaquenil 200 mg 1 tablet by mouth daily.eye exam: 08/17/19.  Her labs have been stable. o Idiopathic chronic gout of multiple sites without tophus - Allopurinol 100 mg 1 tablet by mouth daily.  Uric acid is in desirable range.  She denies any gout flare. o Instability of left knee joint -she has been experiencing instability in her left knee joint.  Plan: XR KNEE 3 VIEW LEFT.  X-rays reveal severe osteoarthritis of the lateral compartment and severe chondromalacia patella.  She would benefit from total knee replacement.  She is not prepared to have total knee replacement yet.  She states she will think about it and will contact us for referral.  Weight loss diet and exercise was emphasized. o Age-related osteoporosis without current pathological fracture - Patient is on Prolia injections every 6 months for osteoporosis. She was approved for a grant. last injection: 05/26/2019  - Plan: denosumab (PROLIA) injection 60 mg today. o History of vitamin D deficiency-she takes supplement. o Primary osteoarthritis of  both knees o MGUS (monoclonal gammopathy of unknown significance) - She is followed by Dr. Irene [name].  o History of hypertension-blood pressure is mildly elevated today o History of chronic kidney disease o History of asthma o History of diabetes mellitus o History of anemia o Orders: o  o  o  Orders Placed This Encounter o  Procedures o  . o XR KNEE 3 VIEW LEFT    o  o  Meds ordered this encounter o  Medications o  . o denosumab (PROLIA) injection 60 mg  Discussed next scheduled Rheumatology f/u visit scheduled for 04/16/20 @ 2:45 PM  Discussed plans with patient for ongoing care management follow up and provided patient with direct contact information for care management team  Patient Self Care Activities:  . Self administers medications as prescribed . Attends all scheduled provider appointments . Calls pharmacy for medication refills . Performs ADL's independently . Performs IADL's independently . Calls provider office for new concerns or questions  Please see past updates related to this goal by clicking on the "Past Updates" button in the  selected goal      .  "My hemoglobin continues to be low" (pt-stated)        CARE PLAN ENTRY (see longtitudinal plan of care for additional care plan information)  Current Barriers:  [name] Knowledge Deficits related to disease process and Self Health management of Anemia . Chronic Disease Management support and education needs related to CKDIII, Hypertensive Nephropathy, DM   Nurse Case Manager Clinical Goal(s):  [name] Over the next 90 days, patient will work with the CCM team and healthcare provider to address needs related to disease education and support to improve Self Health management of Anemia  CCM RN CM Interventions:  03/06/20 call completed with patient  . Evaluation of current treatment plan related to Anemia and patient's adherence to plan as established by provider . Determined patient completed a follow up visit with Dr. Irene [name] on  12/26/19 with the following Assessment/Plan discussed;  o ASSESSMENT & PLAN:  o #1 IgG kappa monoclonal gammopathy of undetermined significance. Bone survey X-ray showed no lytic lesions. M spike is 0.6 g/dL in 10/016 and then 0.5g/dl in 10/2015. 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 o Bone marrow biopsy does show about 13% plasma cells however these appear to be polyclonal and likely related to a possible underlying inflammatory disorder. o Less likely a biclonal plasma cell dyscrasia. o #2 Normocytic normochromic anemia likely related to chronic inflammation from recent diagnosis of lupus/rheumatoid arthritis + CKD.  o Increased acanthocytes on peripheral blood smear -no overt evidence of liver disease. Could be a marker of some MDS o Bone marrow biopsy did not show overt plasma cell dyscrasia or myelodysplastic syndrome. 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 o Bone marrow biopsy showed decreased iron stores. o Ferritin and iron have been trending down, she has not noticed any issues with bruising or bleeding recently. ?absorption issue. I have given her the option of IV iron which she has previously had without issue. She would like to have this.  o PLAN: o -Discussed pt labwork today, 12/26/19; Hgb is stable, blood chemistries are stable  o -Discussed 12/26/2019 Iron and TIBC is as follows: all values are WNL except for TIBC at 225 o -Discussed 12/26/2019 Ferritin at 255 - Goal Ferritin >100 o -Discussed 12/26/2019 MMP is in progress o -Will hold IV Iron due to Ferritin being above goal. Will have pt continue PO Iron Polysaccharide once per day.  o -Will continue to optimize pt's iron levels and manage chronic inflammation from RA (with Rheumatologist) to improve anemia.  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 Estanislado [name]. o  Plan o -Continue optimization of her autoimmune disorder treatment with rheumatology Dr. Estanislado [name]. o -She has no symptoms or findings suggestive of multiple myeloma at this time. o -will Follow-up on pending labs from today SPEP with [facility] o -Continue follow-up with primary care physician and rheumatology. o FOLLOW UP: o RTC with Dr Hannah [name] with labs in 6 months. Plz schedule labs 1 week prior to clinic visit. [name] Discussed plans with patient for ongoing care management follow up  and provided patient with direct contact information for care management team  Patient Self Care Activities:  . Self administers medications as prescribed . Attends all scheduled provider appointments . Calls pharmacy for medication refills . Performs ADL's independently . Performs IADL's independently . Calls provider office for new concerns or questions  Please see past updates related to this goal by clicking on the "Past Updates" button in the selected goal      .  "To maintain my kidney function" (pt-stated)        Current Barriers:  [name] Knowledge Deficits related to disease process and Self Health management of CKD . Chronic Disease Management support and education needs related to CKDIII, Hypertensive Neuropathy, DM  Nurse Case Manager Clinical Goal(s):  [name] Over the next 90 days, patient will work with CCM RN CM to address needs related to disease education and supprt to improve Self Health management of CKD  CCM RN CM Interventions:  03/06/20 call completed with patient . Evaluation of current treatment plan related to CKD and patient's adherence to plan as established by provider. . Reviewed and discussed patient's GFR, BUN and Creatinine; Provided education  to patient re: how to maintain kidney function by controlling diabetes and high blood pressure, staying at a healthy weight, staying well hydrated, and eating a healthy diet, implementing exercise into daily routine and adhering to MD follow up to allow for close monitoring of kidney function  . Determined Dr.Webb continues to follow this condition with upcoming appointment scheduled . Determined patient is Self Checking her BP at home with the following readings recorded;  o 134/66 65 o 142/68 75 o 132/68 68 o 139/86 64 o 133/88 72 o this am 121/68 76  . Determined patient is attending Yoga twice weekly, she is participating at the [facility] twice weekly . Determined patient will continue to Self Monitor her BP and report to Dr. Harrel Lemon. Justin Mend accordingly . Educated on BP target goal, <130/80 . Mailed printed educational materials related to stages of Chronic Kidney Disease . Discussed plans with patient for ongoing care management follow up and provided patient with direct contact information for care management team  Patient Self Care Activities:  . Self administers medications as prescribed . Attends all scheduled provider appointments . Calls pharmacy for medication refills . Performs ADL's independently . Performs IADL's independently . Calls provider office for new concerns or questions  Please see past updates related to this goal by clicking on the "Past Updates" button in the selected goal         Plan:   Telephone follow up appointment with care management team member scheduled for: 05/01/20  Barb Merino, RN, BSN, CCM Care Management Coordinator [place] [facility]  Direct Phone: [phone]

## 2020-03-11 NOTE — Patient Instructions (Signed)
Visit Information  Goals Addressed      Patient Stated   .  "I want to keep my RA under good control" (pt-stated)        Current Barriers:  [name] Knowledge Deficits related to disease process and Self Health management of RA . Chronic Disease Management support and education needs related to RA, DM, Hypertensive Neuropathy, CKDIII  Nurse Case Manager Clinical Goal(s):  [name] Over the next 90 days, patient will work with CCM RN CM to address needs related to disease education and support for RA . Over the next 90 days, the patient will demonstrate ongoing self health care management ability as evidenced by patient will report having no RA exacerbations *  CCM RN CM Interventions:  03/06/20 call completed with patient . Evaluation of current treatment plan related to RA and patient's adherence to plan as established by provider . Discussed and reviewed the following Assessment/Plan per Dr. Estanislado Pandy following Rheumatology f/u visit completed on 12/06/19;  o Assessment / Plan:     o Visit Diagnoses: Rheumatoid arthritis involving multiple sites with positive rheumatoid factor ([place]) - Positive RF, positive anti-CCP.  Patient has no active synovitis.  She has been doing well on Plaquenil. o Other systemic lupus erythematosus with other organ involvement ([place]) - Positive ANA, positive Smith, positive RNP.  Her most recent labs did not show active autoimmune disease.  She has been tolerating Plaquenil well. o High risk medication use - Plaquenil 200 mg 1 tablet by mouth daily.eye exam: 08/17/19.  Her labs have been stable. o Idiopathic chronic gout of multiple sites without tophus - Allopurinol 100 mg 1 tablet by mouth daily.  Uric acid is in desirable range.  She denies any gout flare. o Instability of left knee joint -she has been experiencing instability in her left knee joint.  Plan: XR KNEE 3 VIEW LEFT.  X-rays reveal severe osteoarthritis of the lateral compartment and severe chondromalacia patella.   She would benefit from total knee replacement.  She is not prepared to have total knee replacement yet.  She states she will think about it and will contact us for referral.  Weight loss diet and exercise was emphasized. o Age-related osteoporosis without current pathological fracture - Patient is on Prolia injections every 6 months for osteoporosis. She was approved for a grant. last injection: 05/26/2019  - Plan: denosumab (PROLIA) injection 60 mg today. o History of vitamin D deficiency-she takes supplement. o Primary osteoarthritis of both knees o MGUS (monoclonal gammopathy of unknown significance) - She is followed by Dr. Irene Limbo.  o History of hypertension-blood pressure is mildly elevated today o History of chronic kidney disease o History of asthma o History of diabetes mellitus o History of anemia o Orders: o  o  o  Orders Placed This Encounter o  Procedures o  . o XR KNEE 3 VIEW LEFT    o  o  Meds ordered this encounter o  Medications o  . o denosumab (PROLIA) injection 60 mg  Discussed next scheduled Rheumatology f/u visit scheduled for 04/16/20 @ 2:45 PM  Discussed plans with patient for ongoing care management follow up and provided patient with direct contact information for care management team  Patient Self Care Activities:  . Self administers medications as prescribed . Attends all scheduled provider appointments . Calls pharmacy for medication refills . Performs ADL's independently . Performs IADL's independently . Calls provider office for new concerns or questions  Please see past updates related to this goal  by clicking on the "Past Updates" button in the selected goal      .  "My hemoglobin continues to be low" (pt-stated)        CARE PLAN ENTRY (see longtitudinal plan of care for additional care plan information)  Current Barriers:  [name] Knowledge Deficits related to disease process and Self Health management of Anemia . Chronic Disease Management support and  education needs related to CKDIII, Hypertensive Nephropathy, DM   Nurse Case Manager Clinical Goal(s):  [name] Over the next 90 days, patient will work with the CCM team and healthcare provider to address needs related to disease education and support to improve Self Health management of Anemia  CCM RN CM Interventions:  03/06/20 call completed with patient  . Evaluation of current treatment plan related to Anemia and patient's adherence to plan as established by provider . Determined patient completed a follow up visit with Dr. Irene Limbo on 12/26/19 with the following Assessment/Plan discussed;  o ASSESSMENT & PLAN:  o #1 IgG kappa monoclonal gammopathy of undetermined significance. Bone survey X-ray showed no lytic lesions. M spike is 0.6 g/dL in 10/016 and then 0.5g/dl in 10/2015. 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 o Bone marrow biopsy does show about 13% plasma cells however these appear to be polyclonal and likely related to a possible underlying inflammatory disorder. o Less likely a biclonal plasma cell dyscrasia. o #2 Normocytic normochromic anemia likely related to chronic inflammation from recent diagnosis of lupus/rheumatoid arthritis + CKD.  o Increased acanthocytes on peripheral blood smear -no overt evidence of liver disease. Could be a marker of some MDS o Bone marrow biopsy did not show overt plasma cell dyscrasia or myelodysplastic syndrome. 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 o Bone marrow biopsy showed decreased iron stores. o Ferritin and iron have been trending down, she has not noticed any issues with bruising or bleeding recently. ?absorption issue. I have given her the option of IV iron which she has previously had without issue. She would like to have this.  o PLAN: o -Discussed pt labwork today, 12/26/19;  Hgb is stable, blood chemistries are stable  o -Discussed 12/26/2019 Iron and TIBC is as follows: all values are WNL except for TIBC at 225 o -Discussed 12/26/2019 Ferritin at 255 - Goal Ferritin >100 o -Discussed 12/26/2019 MMP is in progress o -Will hold IV Iron due to Ferritin being above goal. Will have pt continue PO Iron Polysaccharide once per day.  o -Will continue to optimize pt's iron levels and manage chronic inflammation from RA (with Rheumatologist) to improve anemia.  o -Advised pt that the loss of color in her fingers appear to be Raynaud's phenomenon o -Recommend pt keep hands warm to minimize Raynaud's symptoms  o -Will see back in 6 months, with labs 1 week prior o #3 connective tissue disorder possible SLE/rheumatoid arthritis -follows with Dr. Estanislado Pandy. o  Plan o -Continue optimization of her autoimmune disorder treatment with rheumatology Dr. Estanislado Pandy. o -She has no symptoms or findings suggestive of multiple myeloma at this time. o -will Follow-up on pending labs from today SPEP with [facility] o -Continue follow-up with primary care physician and rheumatology. o FOLLOW UP: o RTC with Dr Irene Limbo with labs in 6 months. Plz schedule labs 1 week prior to clinic visit. [name] Discussed  plans with patient for ongoing care management follow up and provided patient with direct contact information for care management team  Patient Self Care Activities:  . Self administers medications as prescribed . Attends all scheduled provider appointments . Calls pharmacy for medication refills . Performs ADL's independently . Performs IADL's independently . Calls provider office for new concerns or questions  Please see past updates related to this goal by clicking on the "Past Updates" button in the selected goal      .  "To maintain my kidney function" (pt-stated)        Current Barriers:  [name] Knowledge Deficits related to disease process and Self Health management of CKD . Chronic Disease  Management support and education needs related to CKDIII, Hypertensive Neuropathy, DM  Nurse Case Manager Clinical Goal(s):  [name] Over the next 90 days, patient will work with CCM RN CM to address needs related to disease education and supprt to improve Self Health management of CKD  CCM RN CM Interventions:  03/06/20 call completed with patient . Evaluation of current treatment plan related to CKD and patient's adherence to plan as established by provider. . Reviewed and discussed patient's GFR, BUN and Creatinine; Provided education to patient re: how to maintain kidney function by controlling diabetes and high blood pressure, staying at a healthy weight, staying well hydrated, and eating a healthy diet, implementing exercise into daily routine and adhering to MD follow up to allow for close monitoring of kidney function  . Determined Dr.Webb continues to follow this condition with upcoming appointment scheduled . Determined patient is Self Checking her BP at home with the following readings recorded;  o 134/66 65 o 142/68 75 o 132/68 68 o 139/86 64 o 133/88 72 o this am 121/68 76  . Determined patient is attending Yoga twice weekly, she is participating at the [facility] twice weekly . Determined patient will continue to Self Monitor her BP and report to Dr. Harrel Lemon. Justin Mend accordingly . Educated on BP target goal, <130/80 . Mailed printed educational materials related to stages of Chronic Kidney Disease . Discussed plans with patient for ongoing care management follow up and provided patient with direct contact information for care management team  Patient Self Care Activities:  . Self administers medications as prescribed . Attends all scheduled provider appointments . Calls pharmacy for medication refills . Performs ADL's independently . Performs IADL's independently . Calls provider office for new concerns or questions  Please see past updates related to this goal by clicking on the  "Past Updates" button in the selected goal         Patient verbalizes understanding of instructions provided today.   Telephone follow up appointment with care management team member scheduled for: 05/01/20  Barb Merino, RN, BSN, CCM Care Management Coordinator [place] [facility]  Direct Phone: [phone]

## 2020-03-14 ENCOUNTER — Telehealth: Payer: Self-pay | Admitting: *Deleted

## 2020-03-14 NOTE — Telephone Encounter (Signed)
Office note from [facility] received from 03/04/2020. Note included labs Reviewed by Dr. Estanislado Pandy  GLucose 104 Creat.: 1.07 GFR: 59

## 2020-03-18 ENCOUNTER — Encounter: Payer: Self-pay | Admitting: Internal Medicine

## 2020-03-19 ENCOUNTER — Encounter: Payer: Medicare HMO | Admitting: Internal Medicine

## 2020-03-20 ENCOUNTER — Ambulatory Visit (INDEPENDENT_AMBULATORY_CARE_PROVIDER_SITE_OTHER): Payer: Medicare HMO | Admitting: Internal Medicine

## 2020-03-20 ENCOUNTER — Encounter: Payer: Self-pay | Admitting: Internal Medicine

## 2020-03-20 ENCOUNTER — Other Ambulatory Visit: Payer: Self-pay

## 2020-03-20 VITALS — BP 160/78 | HR 86 | Temp 98.0°F | Ht 59.0 in | Wt 162.0 lb

## 2020-03-20 DIAGNOSIS — I1310 Hypertensive heart and chronic kidney disease without heart failure, with stage 1 through stage 4 chronic kidney disease, or unspecified chronic kidney disease: Secondary | ICD-10-CM | POA: Insufficient documentation

## 2020-03-20 DIAGNOSIS — L80 Vitiligo: Secondary | ICD-10-CM

## 2020-03-20 DIAGNOSIS — E1122 Type 2 diabetes mellitus with diabetic chronic kidney disease: Secondary | ICD-10-CM | POA: Diagnosis not present

## 2020-03-20 DIAGNOSIS — E6609 Other obesity due to excess calories: Secondary | ICD-10-CM | POA: Diagnosis not present

## 2020-03-20 DIAGNOSIS — E039 Hypothyroidism, unspecified: Secondary | ICD-10-CM | POA: Insufficient documentation

## 2020-03-20 DIAGNOSIS — N183 Chronic kidney disease, stage 3 unspecified: Secondary | ICD-10-CM

## 2020-03-20 DIAGNOSIS — I129 Hypertensive chronic kidney disease with stage 1 through stage 4 chronic kidney disease, or unspecified chronic kidney disease: Secondary | ICD-10-CM | POA: Insufficient documentation

## 2020-03-20 DIAGNOSIS — R002 Palpitations: Secondary | ICD-10-CM | POA: Diagnosis not present

## 2020-03-20 DIAGNOSIS — E119 Type 2 diabetes mellitus without complications: Secondary | ICD-10-CM | POA: Insufficient documentation

## 2020-03-20 DIAGNOSIS — Z6832 Body mass index (BMI) 32.0-32.9, adult: Secondary | ICD-10-CM | POA: Diagnosis not present

## 2020-03-20 NOTE — Progress Notes (Signed)
This visit occurred during the SARS-CoV-2 public health emergency.  Safety protocols were in place, including screening questions prior to the visit, additional usage of staff PPE, and extensive cleaning of exam room while observing appropriate contact time as indicated for disinfecting solutions.  Subjective:     Patient ID: Hannah [name] , female    DOB: Jun 18, 1946 , 74 y.o.   MRN: [id number]   Chief Complaint  Patient presents with  . Diabetes  . Palpitations    HPI  She presents for diabetes check. She reports compliance with meds. She reports having some palpitations. Not associated with chest pain. These can occur at rest. Unable to determine what triggers her sx.   Diabetes She presents for her follow-up diabetic visit. She has type 2 diabetes mellitus. There are no hypoglycemic associated symptoms. There are no hypoglycemic complications. Diabetic complications include nephropathy. Risk factors for coronary artery disease include diabetes mellitus, dyslipidemia, hypertension, post-menopausal and sedentary lifestyle. She is following a diabetic diet. She participates in exercise three times a week. An ACE inhibitor/angiotensin II receptor blocker is being taken.     Past Medical History:  Diagnosis Date  . Anemia   . Arthritis   . Asthma   . Diabetes mellitus without complication ([place])   . Gout 12/17/2014   patient reported  . Hyperlipidemia   . Hypertension   . Systemic lupus erythematosus (HCC)      Family History  Problem Relation Age of Onset  . Heart disease Father   . Diabetes Father   . Hypertension Mother   . Hypertension Sister   . Hypertension Sister   . Leukemia Sister      Current Outpatient Medications:  .  albuterol (PROVENTIL HFA;VENTOLIN HFA) 108 (90 Base) MCG/ACT inhaler, Inhale 2 puffs into the lungs every 6 (six) hours as needed for wheezing or shortness of breath., Disp: 1 Inhaler, Rfl: 2 .  Alcohol Swabs (ALCOHOL PADS) 70 % PADS, Use as  directed to check blood sugars 1 time per day dx: e11.22, Disp: 150 each, Rfl: 2 .  allopurinol (ZYLOPRIM) 100 MG tablet, TAKE 1 TABLET BY MOUTH EVERY DAY FOR GOUT, Disp: 90 tablet, Rfl: 1 .  amLODipine (NORVASC) 2.5 MG tablet, Take 1 tablet (2.5 mg total) by mouth daily., Disp: 30 tablet, Rfl: 11 .  amLODipine (NORVASC) 5 MG tablet, Take 1 tablet (5 mg total) by mouth daily. 1/2 tab in the am and 1 tab in the pm, Disp: 180 tablet, Rfl: 1 .  aspirin EC 81 MG tablet, Take 81 mg by mouth daily., Disp: , Rfl:  .  atorvastatin (LIPITOR) 80 MG tablet, TAKE 1 TABLET BY MOUTH EVERY DAY, Disp: 90 tablet, Rfl: 1 .  Blood Glucose Calibration (ACCU-CHEK AVIVA) SOLN, , Disp: , Rfl:  .  Blood Glucose Monitoring Suppl (ACCU-CHEK AVIVA PLUS) w/Device KIT, Use as directed to check blood sugars 1 time per day dx: e11.22, Disp: 1 kit, Rfl: 1 .  budesonide-formoterol (SYMBICORT) 160-4.5 MCG/ACT inhaler, INHALE 2 PUFFS BY MOUTH TWICE DAILY IN THE MORNING AND EVENING, Disp: 10.2 g, Rfl: 2 .  Calcium Carb-Cholecalciferol (CALCIUM 600+D) 600-800 MG-UNIT TABS, Take by mouth 2 (two) times daily. , Disp: , Rfl:  .  Cholecalciferol (VITAMIN D PO), Take 1,000 Units by mouth daily. , Disp: , Rfl:  .  denosumab (PROLIA) 60 MG/ML SOSY injection, Inject 60 mg into the skin every 6 (six) months., Disp: 1 mL, Rfl: 0 .  hydroxychloroquine (PLAQUENIL) 200 MG tablet, TAKE 1  TABLET BY MOUTH DAILY FOR RHEUMATOID ARTHRITIS, Disp: 90 tablet, Rfl: 0 .  Hypromellose (ARTIFICIAL TEARS OP), Apply to eye as needed., Disp: , Rfl:  .  irbesartan (AVAPRO) 300 MG tablet, Take 1 tablet (300 mg total) by mouth daily., Disp: 90 tablet, Rfl: 2 .  metFORMIN (GLUCOPHAGE) 500 MG tablet, Take 1 tablet (500 mg total) by mouth daily with breakfast., Disp: 90 tablet, Rfl: 0 .  SYNTHROID 88 MCG tablet, TAKE 1 TABLET BY MOUTH EVERY DAY MONDAY TO SATURDAY AND OFF ON SUNDAYS, Disp: 90 tablet, Rfl: 0 .  tacrolimus (PROTOPIC) 0.1 % ointment, Apply topically daily.,  Disp: 30 g, Rfl: 6   Allergies  Allergen Reactions  . Shellfish Allergy Anaphylaxis and Hives     Review of Systems  Constitutional: Negative.   Respiratory: Negative.   Gastrointestinal: Negative.   Neurological: Negative.   Psychiatric/Behavioral: Negative.      Today's Vitals   03/20/20 1543  BP: (!) 160/78  Pulse: 86  Temp: 98 F (36.7 C)  TempSrc: Oral  Weight: 162 lb (73.5 kg)  Height: _0  (1.499 m)  PainSc: 6   PainLoc: Knee   Body mass index is 32.72 kg/m.   Objective:  Physical Exam Vitals and nursing note reviewed.  Constitutional:      Appearance: Normal appearance.  HENT:     Head: Normocephalic and atraumatic.  Cardiovascular:     Rate and Rhythm: Normal rate and regular rhythm.     Heart sounds: Normal heart sounds.  Pulmonary:     Effort: Pulmonary effort is normal.     Breath sounds: Normal breath sounds.  Skin:    General: Skin is warm.     Comments: Scattered areas of hypopigmentation on b/l lower extremities.   Neurological:     General: No focal deficit present.     Mental Status: She is alert.  Psychiatric:        Mood and Affect: Mood normal.        Behavior: Behavior normal.         Assessment And Plan:     1. Type 2 diabetes mellitus with stage 3 chronic kidney disease, without long-term current use of insulin, unspecified whether stage 3a or 3b CKD (HCC)  Chronic, I will check an a1c today. I will adjust meds as needed.   - Hemoglobin A1c  2. Palpitations  EKG performed, no arrhythmia noted. I will check labs as listed below. She is encouraged to stay well hydrated.  She may benefit from magnesium supplementation. I will also refer her to Cardiology for further evaluation. Pt advised she may have to wear monitor, so heart rhythm can be captured when she is having symptoms.   - EKG 12-Lead - TSH - Magnesium - Ambulatory referral to Cardiology  3. Hypertensive nephropathy  Chronic, uncontrolled. She is encouraged to  check BP daily at home and to keep a log. Encouraged to check at different times of day.   4. Primary hypothyroidism  I will check thyroid panel and adjust meds as needed.  She is aware that if levels are supratherapeutic, this could contribute to her sx.    - TSH - T4, Free  5. Vitiligo  She has been evaluated by Pay[facility] Mccallum already. I will check an ANA.   - ANA, IFA (with reflex)  6. Class 1 obesity due to excess calories with serious comorbidity and body mass index (BMI) of 32.0 to 32.9 in adult  She is encouraged to strive for  BMI less than 30 to decrease cardiac risk. Encouraged to aim for at least 150 minutes of exercise per week.   [name], MD    THE PATIENT IS ENCOURAGED TO PRACTICE SOCIAL DISTANCING DUE TO THE COVID-19 PANDEMIC.

## 2020-03-20 NOTE — Patient Instructions (Signed)
Palpitations Palpitations are feelings that your heartbeat is not normal. Your heartbeat may feel like it is:  Uneven.  Faster than normal.  Fluttering.  Skipping a beat. This is usually not a serious problem. In some cases, you may need tests to rule out any serious problems. Follow these instructions at home: Pay attention to any changes in your condition. Take these actions to help manage your symptoms: Eating and drinking  Avoid: ? Coffee, tea, soft drinks, and energy drinks. ? Chocolate. ? Alcohol. ? Diet pills. Lifestyle   Try to lower your stress. These things can help you relax: ? Yoga. ? Deep breathing and meditation. ? Exercise. ? Using words and images to create positive thoughts (guided imagery). ? Using your mind to control things in your body (biofeedback).  Do not use drugs.  Get plenty of rest and sleep. Keep a regular bed time. General instructions   Take over-the-counter and prescription medicines only as told by your doctor.  Do not use any products that contain nicotine or tobacco, such as cigarettes and e-cigarettes. If you need help quitting, ask your doctor.  Keep all follow-up visits as told by your doctor. This is important. You may need more tests if palpitations do not go away or get worse. Contact a doctor if:  Your symptoms last more than 24 hours.  Your symptoms occur more often. Get help right away if you:  Have chest pain.  Feel short of breath.  Have a very bad headache.  Feel dizzy.  Pass out (faint). Summary  Palpitations are feelings that your heartbeat is uneven or faster than normal. It may feel like your heart is fluttering or skipping a beat.  Avoid food and drinks that may cause palpitations. These include caffeine, chocolate, and alcohol.  Try to lower your stress. Do not smoke or use drugs.  Get help right away if you faint or have chest pain, shortness of breath, a severe headache, or dizziness. This  information is not intended to replace advice given to you by your health care provider. Make sure you discuss any questions you have with your health care provider. Document Revised: 10/27/2017 Document Reviewed: 10/27/2017 Elsevier Patient Education  2020 [employer]  

## 2020-03-22 LAB — MAGNESIUM: Magnesium: 2.1 mg/dL (ref 1.6–2.3)

## 2020-03-22 LAB — FANA STAINING PATTERNS
CENTROMERE AB: 1:1280 {titer} — ABNORMAL HIGH
Speckled Pattern: 1:320 {titer} — ABNORMAL HIGH

## 2020-03-22 LAB — ANTINUCLEAR ANTIBODIES, IFA: ANA Titer 1: POSITIVE — AB

## 2020-03-22 LAB — HEMOGLOBIN A1C
Est. average glucose Bld gHb Est-mCnc: 117 mg/dL
Hgb A1c MFr Bld: 5.7 % — ABNORMAL HIGH (ref 4.8–5.6)

## 2020-03-22 LAB — T4, FREE: Free T4: 1.52 ng/dL (ref 0.82–1.77)

## 2020-03-22 LAB — TSH: TSH: 1.14 u[IU]/mL (ref 0.450–4.500)

## 2020-03-25 ENCOUNTER — Other Ambulatory Visit: Payer: Self-pay | Admitting: *Deleted

## 2020-03-25 DIAGNOSIS — M0579 Rheumatoid arthritis with rheumatoid factor of multiple sites without organ or systems involvement: Secondary | ICD-10-CM

## 2020-03-25 MED ORDER — HYDROXYCHLOROQUINE SULFATE 200 MG PO TABS: ORAL_TABLET | ORAL | 0 refills | Status: DC

## 2020-03-25 NOTE — Telephone Encounter (Signed)
Ok to refill plaquenil 200 mg 1 tablet by mouth daily.

## 2020-03-25 NOTE — Patient Instructions (Signed)
Visit Information  Goals Addressed            This Visit's Progress   . Pharmacy Care Plan       CARE PLAN ENTRY (see longitudinal plan of care for additional care plan information)  Current Barriers:  . Chronic Disease Management support, education, and care coordination needs related to Hypertension, Hyperlipidemia, Diabetes, Asthma, Hypothyroidism, and Osteoporosis   Hypertension BP Readings from Last 3 Encounters:  03/20/20 (!) 160/78  12/26/19 (!) 155/64  11/29/19 (!) 144/73   . Pharmacist Clinical Goal(s): o Over the next 90 days, patient will work with PharmD and providers to achieve BP goal <130/80 . Current regimen:  o Amlodipine 2.5mg  every morning and 5mg  every evening o Irbesartan 300mg  daily . Interventions: o Contacted [employer] regarding coverage of amlodipine 2.5mg  and 5mg  simultaneously . Patient self care activities - Over the next 14 days, patient will: o Check BP daily, document, and provide at future appointments o Ensure daily salt intake < 2300 mg/day  Hyperlipidemia Lab Results  Component Value Date/Time   LDLCALC 53 10/31/2019 04:35 PM   . Pharmacist Clinical Goal(s): o Over the next 90 days, patient will work with PharmD and providers to maintain LDL goal < 70 . Current regimen:  o Atorvastatin 80 mg daily . Interventions: o Provided dietary and exercise recommendations . Patient self care activities - Over the next 90 days, patient will: o Take cholesterol medication daily as directed  Diabetes Lab Results  Component Value Date/Time   HGBA1C 5.7 (H) 03/20/2020 04:45 PM   HGBA1C 5.6 10/31/2019 04:35 PM   . Pharmacist Clinical Goal(s): o Over the next 90 days, patient will work with PharmD and providers to maintain A1c goal <7% . Current regimen:  o Metformin 500mg  daily . Interventions: o Provided dietary and exercise recommendations . Patient self care activities - Over the next 90 days, patient will: o Check blood sugar several times  per week, document, and provide at future appointments o Contact provider with any episodes of hypoglycemia  Hypothyroidism . Pharmacist Clinical Goal(s) o Over the next 90 days, patient will work with PharmD and providers to obtain Synthroid medication . Current regimen:  o Synthroid 16mcg daily Monday through Saturday, skip Sundays . Interventions: o Review alternative assistance options for Synthroid . Patient self care activities - Over the next 90 days, patient will: o Continue taking Synthroid daily as directed  Medication management . Pharmacist Clinical Goal(s): o Over the next 90 days, patient will work with PharmD and providers to achieve optimal medication adherence . Current pharmacy: [facility] . Interventions o Comprehensive medication review performed. o Continue current medication management strategy . Patient self care activities - Over the next 90 days, patient will: o Focus on medication adherence by considering use of pill box to add in organization of medication o Take medications as prescribed o Report any questions or concerns to PharmD and/or provider(s)  Initial goal documentation        Ms. Werk was given information about Chronic Care Management services today including:  1. CCM service includes personalized support from designated clinical staff supervised by her physician, including individualized plan of care and coordination with other care providers 2. 24/7 contact phone numbers for assistance for urgent and routine care needs. 3. Standard insurance, coinsurance, copays and deductibles apply for chronic care management only during months in which we provide at least 20 minutes of these services. Most insurances cover these services at 100%, however patients may be  responsible for any copay, coinsurance and/or deductible if applicable. This service may help you avoid the need for more expensive face-to-face services. 4. Only one practitioner may  furnish and bill the service in a calendar month. 5. The patient may stop CCM services at any time (effective at the end of the month) by phone call to the office staff.  Patient agreed to services and verbal consent obtained.   Patient verbalizes understanding of instructions provided today.  Telephone follow up appointment with pharmacy team member scheduled for: 04/22/20 @ 3:30 PM  Jannette Fogo, PharmD Clinical Pharmacist [facility] [phone]

## 2020-03-25 NOTE — Telephone Encounter (Signed)
Refill request received via fax  Last Visit: 11/29/19 Next Visit: 04/05/20 Labs:03/04/2020 Glucose 104 Creat.: 1.07 GFR: 59  Eye exam: 08/17/19 WNL  Current Dose per office note 11/29/2019: Plaquenil 200 mg 1 tablet by mouth daily DX:  Rheumatoid arthritis   Okay to refill PLQ?

## 2020-03-25 NOTE — Telephone Encounter (Signed)
Verified verbal prescription with Hazel Sams, PA-C to send in plaquenil 200 mg 1 tablet by mouth daily.  #90 no refills.

## 2020-03-26 ENCOUNTER — Other Ambulatory Visit: Payer: Self-pay

## 2020-03-26 ENCOUNTER — Ambulatory Visit: Payer: Medicare HMO

## 2020-03-26 VITALS — BP 138/72 | HR 72 | Temp 98.0°F | Ht <= 58 in | Wt 159.4 lb

## 2020-03-26 DIAGNOSIS — I129 Hypertensive chronic kidney disease with stage 1 through stage 4 chronic kidney disease, or unspecified chronic kidney disease: Secondary | ICD-10-CM

## 2020-03-26 NOTE — Progress Notes (Signed)
Pt presents today for b/p check she is currently taking amlodipine 2.5 MG in the morning and amlodipine 5MG  at night   Pt machine today 145/78 Manual 138/72  Per RS: this bp reading is much better. is she feeling better?  Pt:she said she is feeling good she is on her way to her workout class after she leaves here

## 2020-04-02 ENCOUNTER — Other Ambulatory Visit: Payer: Self-pay

## 2020-04-02 ENCOUNTER — Ambulatory Visit: Payer: Medicare HMO

## 2020-04-02 VITALS — BP 132/64 | HR 84 | Temp 98.2°F | Ht <= 58 in | Wt 160.2 lb

## 2020-04-02 DIAGNOSIS — I10 Essential (primary) hypertension: Secondary | ICD-10-CM

## 2020-04-02 NOTE — Progress Notes (Signed)
pt is here for her afternoon bp check 132/64 is her blood pressure today. she is taking amlodipine 2.5mg  in the am and 5mg  at night Pt advised to continue with current medications.

## 2020-04-03 DIAGNOSIS — M5136 Other intervertebral disc degeneration, lumbar region: Secondary | ICD-10-CM | POA: Diagnosis not present

## 2020-04-03 DIAGNOSIS — M9903 Segmental and somatic dysfunction of lumbar region: Secondary | ICD-10-CM | POA: Diagnosis not present

## 2020-04-03 DIAGNOSIS — M47814 Spondylosis without myelopathy or radiculopathy, thoracic region: Secondary | ICD-10-CM | POA: Diagnosis not present

## 2020-04-03 DIAGNOSIS — M9902 Segmental and somatic dysfunction of thoracic region: Secondary | ICD-10-CM | POA: Diagnosis not present

## 2020-04-03 DIAGNOSIS — M9905 Segmental and somatic dysfunction of pelvic region: Secondary | ICD-10-CM | POA: Diagnosis not present

## 2020-04-03 DIAGNOSIS — M5137 Other intervertebral disc degeneration, lumbosacral region: Secondary | ICD-10-CM | POA: Diagnosis not present

## 2020-04-03 NOTE — Progress Notes (Signed)
Office Visit Note  Patient: Hannah [name]             Date of Birth: 09-17-1946           MRN: [id number]             PCP: Glendale Chard, MD Referring: Glendale Chard, MD Visit Date: 04/16/2020 Occupation: @GUAROCC @  Subjective:  Pain in multiple joints.   History of Present Illness: Hannah [name] is a 74 y.o. female history of lupus and rheumatoid arthritis overlap.  She states she has been doing well without any joint swelling.  She continues to have some joint stiffness and discomfort in her hands.  Her right knee joint has been bothering her recently.  She has some stiffness in her feet.  She has not had any gout flare.  Patient states she was recently seen by dermatologist who suspected possible vitiligo.  She is also seen her nephrologist and she was told that her kidney functions are stable.  Activities of Daily Living:  Patient reports morning stiffness for 5  minutes.   Patient Reports nocturnal pain.  Difficulty dressing/grooming: Denies Difficulty climbing stairs: Reports Difficulty getting out of chair: Reports Difficulty using hands for taps, buttons, cutlery, and/or writing: Denies  Review of Systems  Constitutional: Positive for fatigue.  HENT: Negative for mouth sores, mouth dryness and nose dryness.   Eyes: Positive for itching and dryness. Negative for pain.  Respiratory: Negative for shortness of breath and difficulty breathing.   Cardiovascular: Positive for palpitations. Negative for chest pain.  Gastrointestinal: Negative for blood in stool, constipation and diarrhea.  Endocrine: Negative for increased urination.  Genitourinary: Negative for difficulty urinating.  Musculoskeletal: Positive for arthralgias, joint pain, joint swelling and morning stiffness. Negative for myalgias, muscle tenderness and myalgias.  Skin: Positive for color change. Negative for rash and redness.  Allergic/Immunologic: Negative for susceptible to infections.    Neurological: Positive for numbness. Negative for dizziness, headaches, memory loss and weakness.  Hematological: Positive for anemia.  Psychiatric/Behavioral: Negative for confusion.    PMFS History:  Patient Active Problem List   Diagnosis Date Noted  . Type 2 diabetes mellitus with stage 3 chronic kidney disease, without long-term current use of insulin ([place]) 03/20/2020  . Palpitations 03/20/2020  . 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/2020  . Primary hypothyroidism 03/20/2020  . Vitiligo 03/20/2020  . Iron deficiency anemia 08/12/2017  . Osteoporosis 10/02/2016  . Systemic lupus erythematosus ([place]) 10/02/2016  . Rheumatoid arthritis involving multiple sites with positive rheumatoid factor ([place]) 10/02/2016  . High risk medication use 10/02/2016  . History of chronic kidney disease 10/02/2016  . 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/2016  . Primary osteoarthritis of both knees 10/02/2016  . Vitamin D deficiency 10/02/2016  . History of diabetes mellitus 10/02/2016  . History of hypertension 10/02/2016  . History of asthma 10/02/2016  . Absolute anemia   . MGUS (monoclonal gammopathy of unknown significance)   . Normocytic anemia 03/21/2015  . Essential hypertension 03/20/2015  . Abnormal CT of the chest 07/30/2012  . Asthma 06/19/2012    Past Medical History:  Diagnosis Date  . Anemia   . Arthritis   . Asthma   . Diabetes mellitus without complication ([place])   . Gout 12/17/2014   patient reported  . Hyperlipidemia   . Hypertension   . Systemic lupus erythematosus (HCC)     Family History  Problem Relation Age of Onset  . Heart disease Father   . Diabetes  Father   . Hypertension Mother   . Hypertension Sister   . Hypertension Sister   . Leukemia Sister    Past Surgical History:  Procedure Laterality Date  . CATARACT EXTRACTION Bilateral 2015  . DILATION AND CURETTAGE OF UTERUS    . DOPPLER ECHOCARDIOGRAPHY  05/2018   Internist to review  with pt; potential heart murmur 06/20/18  . keratosis removal  2021  . SKIN SURGERY  11/30/2018   left side of face   Social History   Social History Narrative  . Not on file   Immunization History  Administered Date(s) Administered  . 19-influenza Whole 07/15/2012  . DTaP 06/18/2019  . Influenza Split 06/28/2014  . Influenza, High Dose Seasonal PF 05/18/2019  . Influenza,inj,Quad PF,6+ Mos 06/20/2018  . Moderna SARS-COVID-2 Vaccination 11/20/2019, 12/19/2019  . Pneumococcal Conjugate-13 05/04/2018  . Pneumococcal Polysaccharide-23 05/18/2019  . Pneumococcal-Unspecified 06/28/2014  . Tdap 06/15/2019     Objective: Vital Signs: BP 140/79 (BP Location: Left Arm, Patient Position: Sitting, Cuff Size: Normal)   Pulse 69   Resp 15   Ht 4\' 11"  (1.499 m)   Wt 161 lb 12.8 oz (73.4 kg)   BMI 32.68 kg/m    Physical Exam Vitals and nursing note reviewed.  Constitutional:      Appearance: She is well-developed.  HENT:     Head: Normocephalic and atraumatic.  Eyes:     Conjunctiva/sclera: Conjunctivae normal.  Cardiovascular:     Rate and Rhythm: Normal rate and regular rhythm.     Heart sounds: Normal heart sounds.  Pulmonary:     Effort: Pulmonary effort is normal.     Breath sounds: Normal breath sounds.  Abdominal:     General: Bowel sounds are normal.     Palpations: Abdomen is soft.  Musculoskeletal:     Cervical back: Normal range of motion.  Lymphadenopathy:     Cervical: No cervical adenopathy.  Skin:    General: Skin is warm and dry.     Capillary Refill: Capillary refill takes less than 2 seconds.  Neurological:     Mental Status: She is alert and oriented to person, place, and time.  Psychiatric:        Behavior: Behavior normal.      Musculoskeletal Exam: C-spine was in range of motion.  She has thoracic kyphosis.  Shoulder joints, elbow joints, wrist joints with good range of motion.  She has ulnar deviation without any synovitis.  She is synovial  thickening over MCP joints.  PIP and DIP thickening was noted.  Hip joints, knee joints, ankles with good range of motion.  She had no tenderness over MTPs.  CDAI Exam: CDAI Score: 1.8  Patient Global: 4 mm; Provider Global: 4 mm Swollen: 0 ; Tender: 1  Joint Exam 04/16/2020      Right  Left  Knee   Tender        Investigation: No additional findings.  Imaging: XR Foot 2 Views Left  Result Date: 04/16/2020 Juxta-articular osteopenia was noted.  Narrowing of all MTP PIP and DIP joints was noted.  Subluxation of first MTP joint was noted.  Intertarsal joint space narrowing was noted.  Subtalar joint space narrowing was noted.  Inferior and posterior calcaneal spurs were noted.  No radiographic progression was noted when compared to the films of 2018. Impression: These findings are consistent with rheumatoid arthritis and osteoarthritis overlap.  XR Foot 2 Views Right  Result Date: 04/16/2020 Juxta-articular osteopenia was noted.  Narrowing of  all MTP joints, PIP and DIP joints was noted.  Intertarsal joint space narrowing was noted.  Tibiotalar and subtalar joint space narrowing was noted.No radiographic progression was noted when compared to the x-rays of October 05, 2016. Impression: These findings are consistent with rheumatoid arthritis and osteoarthritis overlap.    XR Hand 2 View Left  Result Date: 04/16/2020 Severe CMC PIP and DIP narrowing was noted.  Juxta-articular osteopenia was noted.  First, second and third MCP joint narrowing was noted.  Intercarpal and radiocarpal joint space narrowing was noted.  No radiographic progression was noted when compared with the films of October 05, 2016. Impression: These findings are consistent with rheumatoid arthritis and osteoarthritis overlap.  XR Hand 2 View Right  Result Date: 04/16/2020 Juxta-articular osteopenia was noted.  Severe second and third MCP joint narrowing was noted.  DIP and PIP narrowing was noted.  Subluxation of third  and fourth PIP joints and second PIP joint was noted.  Severe CMC narrowing and subluxation was noted.  Intercarpal and radiocarpal joint space narrowing was noted.  No radiographic progression was noted when compared to the x-rays of 2018. Impression: These findings are consistent with rheumatoid arthritis and osteoarthritis overlap.  XR KNEE 3 VIEW RIGHT  Result Date: 04/16/2020 Severe medial compartment narrowing with intercondylar, medial and lateral osteophytes were noted.  No chondrocalcinosis was noted.  Severe patellofemoral narrowing was noted. Impression: These findings are consistent with severe osteoarthritis and severe chondromalacia patella.   Recent Labs: Lab Results  Component Value Date   WBC 5.1 12/26/2019   HGB 10.8 (L) 12/26/2019   PLT 164 12/26/2019   NA 139 12/26/2019   K 4.1 12/26/2019   CL 106 12/26/2019   CO2 25 12/26/2019   GLUCOSE 95 12/26/2019   BUN 17 12/26/2019   CREATININE 1.06 (H) 12/26/2019   BILITOT 0.7 12/26/2019   ALKPHOS 48 12/26/2019   AST 19 12/26/2019   ALT 13 12/26/2019   PROT 7.0 12/26/2019   ALBUMIN 3.9 12/26/2019   CALCIUM 8.7 (L) 12/26/2019   GFRAA 60 (L) 12/26/2019    Speciality Comments: PLQ eye exam: 08/17/2019 WNL @ [facility]  Procedures:  No procedures performed Allergies: Shellfish allergy   Assessment / Plan:     Visit Diagnoses: Rheumatoid arthritis involving multiple sites with positive rheumatoid factor (HCC) - Positive RF, positive anti-CCP. -She has pain and discomfort in her bilateral hands and bilateral feet.  No synovitis was noted.  Ulnar deviation and MCP thickening was noted.  Plan: XR Hand 2 View Right, XR Hand 2 View Left, XR Foot 2 Views Right, XR Foot 2 Views Left x-ray showed severe rheumatoid arthritis and osteoarthritis overlap.  No radiographic progression was noted when compared to the x-rays of 2018.  Other systemic lupus erythematosus with other organ involvement (HCC) - Positive ANA, positive Smith,  positive RNP.  Her disease has been quiet.  On her autoimmune antibodies have been negative.  Chronic pain of right knee -she has pain and discomfort in her right knee joint.  She has severe osteoarthritis in her left knee joint.  Plan: XR KNEE 3 VIEW RIGHT.  X-ray showed severe osteoarthritis and severe chondromalacia patella.  High risk medication use - Plaquenil 200 mg 1 tablet by mouth daily.eye exam: 08/17/19 - Plan: CBC with Differential/Platelet, COMPLETE METABOLIC PANEL WITH GFR today and then in 5 months.    Idiopathic chronic gout of multiple sites without tophus - Allopurinol 100 mg 1 tablet by mouth daily.  -She  has not had any gout flare.  Plan: Uric acid  Age-related osteoporosis without current pathological fracture - Patient is on Prolia injections every 6 months for osteoporosis. She was approved for a grant. last injection: 11/29/19  History of vitamin D deficiency  Primary osteoarthritis of both knees  MGUS (monoclonal gammopathy of unknown significance)-she is followed by hematology.  History of hypertension-her systolic blood pressure is elevated.  History of chronic kidney disease-followed by nephrology.  History of asthma  History of diabetes mellitus  History of anemia  Instability of left knee joint    Orders: Orders Placed This Encounter  Procedures  . XR Hand 2 View Right  . XR Hand 2 View Left  . XR Foot 2 Views Right  . XR Foot 2 Views Left  . XR KNEE 3 VIEW RIGHT  . CBC with Differential/Platelet  . COMPLETE METABOLIC PANEL WITH GFR  . Uric acid   No orders of the defined types were placed in this encounter.    Follow-Up Instructions: Return for Rheumatoid arthritis, Systemic lupus.   Bo Merino, MD  Note - This record has been created using Editor, commissioning.  Chart creation errors have been sought, but may not always  have been located. Such creation errors do not reflect on  the standard of medical care.

## 2020-04-05 ENCOUNTER — Ambulatory Visit: Payer: Medicare HMO | Admitting: Rheumatology

## 2020-04-07 NOTE — Progress Notes (Signed)
This appt was cancelled. Erroneous

## 2020-04-11 ENCOUNTER — Ambulatory Visit: Payer: Medicare HMO | Admitting: Cardiology

## 2020-04-15 ENCOUNTER — Other Ambulatory Visit: Payer: Self-pay | Admitting: Internal Medicine

## 2020-04-16 ENCOUNTER — Ambulatory Visit: Payer: Self-pay

## 2020-04-16 ENCOUNTER — Ambulatory Visit: Payer: Medicare HMO | Admitting: Rheumatology

## 2020-04-16 ENCOUNTER — Encounter: Payer: Self-pay | Admitting: Rheumatology

## 2020-04-16 ENCOUNTER — Other Ambulatory Visit: Payer: Self-pay

## 2020-04-16 VITALS — BP 140/79 | HR 69 | Resp 15 | Ht 59.0 in | Wt 161.8 lb

## 2020-04-16 DIAGNOSIS — M170 Bilateral primary osteoarthritis of knee: Secondary | ICD-10-CM

## 2020-04-16 DIAGNOSIS — Z8679 Personal history of other diseases of the circulatory system: Secondary | ICD-10-CM | POA: Diagnosis not present

## 2020-04-16 DIAGNOSIS — G8929 Other chronic pain: Secondary | ICD-10-CM

## 2020-04-16 DIAGNOSIS — D472 Monoclonal gammopathy: Secondary | ICD-10-CM

## 2020-04-16 DIAGNOSIS — Z8639 Personal history of other endocrine, nutritional and metabolic disease: Secondary | ICD-10-CM | POA: Diagnosis not present

## 2020-04-16 DIAGNOSIS — M0579 Rheumatoid arthritis with rheumatoid factor of multiple sites without organ or systems involvement: Secondary | ICD-10-CM | POA: Diagnosis not present

## 2020-04-16 DIAGNOSIS — M1A09X0 Idiopathic chronic gout, multiple sites, without tophus (tophi): Secondary | ICD-10-CM

## 2020-04-16 DIAGNOSIS — M3219 Other organ or system involvement in systemic lupus erythematosus: Secondary | ICD-10-CM | POA: Diagnosis not present

## 2020-04-16 DIAGNOSIS — Z8709 Personal history of other diseases of the respiratory system: Secondary | ICD-10-CM

## 2020-04-16 DIAGNOSIS — M25362 Other instability, left knee: Secondary | ICD-10-CM

## 2020-04-16 DIAGNOSIS — Z87448 Personal history of other diseases of urinary system: Secondary | ICD-10-CM

## 2020-04-16 DIAGNOSIS — M810 Age-related osteoporosis without current pathological fracture: Secondary | ICD-10-CM | POA: Diagnosis not present

## 2020-04-16 DIAGNOSIS — M25561 Pain in right knee: Secondary | ICD-10-CM

## 2020-04-16 DIAGNOSIS — Z79899 Other long term (current) drug therapy: Secondary | ICD-10-CM | POA: Diagnosis not present

## 2020-04-16 DIAGNOSIS — Z862 Personal history of diseases of the blood and blood-forming organs and certain disorders involving the immune mechanism: Secondary | ICD-10-CM

## 2020-04-17 ENCOUNTER — Other Ambulatory Visit: Payer: Self-pay | Admitting: Internal Medicine

## 2020-04-17 ENCOUNTER — Telehealth: Payer: Self-pay | Admitting: Rheumatology

## 2020-04-17 LAB — CBC WITH DIFFERENTIAL/PLATELET
Absolute Monocytes: 498 cells/uL (ref 200–950)
Basophils Absolute: 50 cells/uL (ref 0–200)
Basophils Relative: 0.9 %
Eosinophils Absolute: 358 cells/uL (ref 15–500)
Eosinophils Relative: 6.4 %
HCT: 34.8 % — ABNORMAL LOW (ref 35.0–45.0)
Hemoglobin: 11.3 g/dL — ABNORMAL LOW (ref 11.7–15.5)
Lymphs Abs: 1165 cells/uL (ref 850–3900)
MCH: 30.9 pg (ref 27.0–33.0)
MCHC: 32.5 g/dL (ref 32.0–36.0)
MCV: 95.1 fL (ref 80.0–100.0)
MPV: 11.8 fL (ref 7.5–12.5)
Monocytes Relative: 8.9 %
Neutro Abs: 3528 cells/uL (ref 1500–7800)
Neutrophils Relative %: 63 %
Platelets: 180 10*3/uL (ref 140–400)
RBC: 3.66 10*6/uL — ABNORMAL LOW (ref 3.80–5.10)
RDW: 12.9 % (ref 11.0–15.0)
Total Lymphocyte: 20.8 %
WBC: 5.6 10*3/uL (ref 3.8–10.8)

## 2020-04-17 LAB — COMPLETE METABOLIC PANEL WITH GFR
AG Ratio: 1.6 (calc) (ref 1.0–2.5)
ALT: 15 U/L (ref 6–29)
AST: 20 U/L (ref 10–35)
Albumin: 4.2 g/dL (ref 3.6–5.1)
Alkaline phosphatase (APISO): 44 U/L (ref 37–153)
BUN/Creatinine Ratio: 10 (calc) (ref 6–22)
BUN: 13 mg/dL (ref 7–25)
CO2: 25 mmol/L (ref 20–32)
Calcium: 9.5 mg/dL (ref 8.6–10.4)
Chloride: 105 mmol/L (ref 98–110)
Creat: 1.24 mg/dL — ABNORMAL HIGH (ref 0.60–0.93)
GFR, Est African American: 50 mL/min/{1.73_m2} — ABNORMAL LOW (ref >=60)
GFR, Est Non African American: 43 mL/min/{1.73_m2} — ABNORMAL LOW (ref >=60)
Globulin: 2.7 g/dL (calc) (ref 1.9–3.7)
Glucose, Bld: 82 mg/dL (ref 65–99)
Potassium: 4.8 mmol/L (ref 3.5–5.3)
Sodium: 140 mmol/L (ref 135–146)
Total Bilirubin: 0.5 mg/dL (ref 0.2–1.2)
Total Protein: 6.9 g/dL (ref 6.1–8.1)

## 2020-04-17 LAB — URIC ACID: Uric Acid, Serum: 4.2 mg/dL (ref 2.5–7.0)

## 2020-04-17 NOTE — Telephone Encounter (Signed)
Patient advised Dr. Estanislado Pandy has not reviewed her results at this time. Patient advised will call with lab results once Dr. Estanislado Pandy reviews them.

## 2020-04-17 NOTE — Telephone Encounter (Signed)
Patient left a voicemail stating she had an appointment yesterday and went online to look at some of the Mychart lab results.  Patient states she has a couple of questions regarding some items that were flagged.  Patient states she will be home for the remainder of the day today 04/17/20 and after 3:00 pm tomorrow 04/18/20.

## 2020-04-18 ENCOUNTER — Other Ambulatory Visit: Payer: Self-pay | Admitting: Internal Medicine

## 2020-04-18 ENCOUNTER — Encounter: Payer: Self-pay | Admitting: Internal Medicine

## 2020-04-22 ENCOUNTER — Other Ambulatory Visit: Payer: Self-pay

## 2020-04-22 ENCOUNTER — Ambulatory Visit (INDEPENDENT_AMBULATORY_CARE_PROVIDER_SITE_OTHER): Payer: Medicare HMO

## 2020-04-22 DIAGNOSIS — I10 Essential (primary) hypertension: Secondary | ICD-10-CM

## 2020-04-22 DIAGNOSIS — E039 Hypothyroidism, unspecified: Secondary | ICD-10-CM

## 2020-04-22 NOTE — Chronic Care Management (AMB) (Signed)
Chronic Care Management Pharmacy  Name: Hannah [name]  MRN: [id number] DOB: 03-Nov-1945  Chief Complaint/ HPI  [name],  74 y.o. , female presents for their Follow-Up CCM visit with the clinical pharmacist via telephone due to COVID-19 Pandemic.  PCP : Glendale Chard, MD  Their chronic conditions include: Hypertension, Rheumatoid arthritis, Systemic lupus erythematosus. Hypothyroidism, Type 2 diabetes mellitus, Gout, Anemia, Osteoporosis, Osteoarthritis   Office Visits: 04/02/20 Nurse visit: Presented for BP check. 132/64. Continue current medications.   03/26/20 Nurse visit: Presented for BP check. Pt machine 145/78, manual 138/72.  03/20/20 OV: Labs ordered (HgbA1c, TSH, T4 free, Magnesium, ANA IFA). Pt complained of palpitations. No arrhythmias noted on EKG. Referred to Cardiology for further evaluation. HTN uncontrolled, encouraged to keep daily BP log at home.   10/31/19 OV: Presented for full physical exam and diabetes and hypertension follow up. Reported compliance with medications. Complained of eyelid swelling and midback pain. Diabetic foot exam performed. Labs ordered (HgbA1c, CBC, CMP14+EGFR, Lipid panel, UA, UA-microalbumin). EKG performed, no new changes noted. Advised to perform stretches and take magnesium nightly for back pain. Advised pt to use small amount of Preparation H on lower eyelids 2-3 times per week for gland swelling.   Consult Visits: 04/16/20 Rheumatology OV w/ Dr. Estanislado Pandy: Complained of chronic right knee pain. Xray showed severe osteoarthritis and severe chondromalacia patella. Labs ordered (CBC with GFR, Uric acid). Continue current medications.     02/06/20 Dermatology OV w/ K. Sheffield: Biopsy performed for neoplasm and vitiligo. Rx for tacrolimus ointment sent to pharmacy.   12/26/19 Oncology OV w/ Dr. Irene Limbo: Presented or follow up of MGUS and anemia. Pt reported she began taking iron polysaccharide BID after rheumatology office visit when  Hgb was close to 10. Reduce iron polysaccharide back to once daily as Ferritin was 225. Follow up in 6 months.   11/29/19 Rheumatology OV w/ Dr. 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 Pt on Prolia injections every 6 months for osteoporosis. Prolia injected today.   09/05/19 OV Gynecology w/ Dr. Dellis Filbert: Presented for annual gynecological exam in menopause. Pap reflex done. Mammogram 2020 negative. Colonoscopy 2014. No hormone replacement. Followed by Endocrinology for osteoporosis. Recommended low calorie/carb diet and increase in physical activity and light weight lifting.   CCM Encounters: 3/17 21 PharmD: Tier exception for [facility] denied. Samples given for Synthroid 1mg.   12/12/19 RN: Evaluation of current treatment plans. Discussed knee pain and process for orthopedics referral if desired.   11/09/19 RN: Per Dr.Sanders, pt may decrease metformin to 5074monce daily with breakfast. CKD treatment plan evaluated.   11/07/19 RN: Evaluation of current treatment plans. Pt education provided.    09/04/19 PharmD: Tier exception for Synthroid denied. Will call for appeal process.   08/30/19 PharmD: Comprehensive medication review performed. Counseled on use of rescue albuterol and Symbicort. Patient paying $44/month for brand name Synthroid. Pt doesn't wish to participate in Synthroid Delivers due to apt complex mail room being close due to [employer] and [facility]  Medications: Outpatient Encounter Medications as of 04/22/2020  Medication Sig  . albuterol (PROVENTIL HFA;VENTOLIN HFA) 108 (90 Base) MCG/ACT inhaler Inhale 2 puffs into the lungs every 6 (six) hours as needed for wheezing or shortness of breath.  . Alcohol Swabs (ALCOHOL PADS) 70 % PADS Use as directed to check blood sugars 1 time per day dx: e11.22  . allopurinol (ZYLOPRIM) 100 MG tablet TAKE 1  TABLET BY MOUTH EVERY DAY FOR GOUT  . amLODipine  (NORVASC) 2.5 MG tablet Take 1 tablet (2.5 mg total) by mouth daily. (Patient taking differently: Take 2.5 mg by mouth daily. In the morning)  . amLODipine (NORVASC) 5 MG tablet Take 1 tablet (5 mg total) by mouth daily. 1/2 tab in the am and 1 tab in the pm (Patient taking differently: Take 5 mg by mouth daily. In the evening)  . aspirin EC 81 MG tablet Take 81 mg by mouth daily.  [name] atorvastatin (LIPITOR) 80 MG tablet TAKE 1 TABLET BY MOUTH EVERY DAY  . Blood Glucose Calibration (ACCU-CHEK AVIVA) SOLN   . Blood Glucose Monitoring Suppl (ACCU-CHEK AVIVA PLUS) w/Device KIT Use as directed to check blood sugars 1 time per day dx: e11.22  . budesonide-formoterol (SYMBICORT) 160-4.5 MCG/ACT inhaler INHALE 2 PUFFS BY MOUTH TWICE DAILY IN THE MORNING AND EVENING (Patient taking differently: daily as needed. INHALE 2 PUFFS BY MOUTH TWICE DAILY IN THE MORNING AND EVENING)  . Calcium Carb-Cholecalciferol (CALCIUM 600+D) 600-800 MG-UNIT TABS Take 1 tablet by mouth daily.   . Cholecalciferol (VITAMIN D PO) Take 2,000 Units by mouth daily.   [name] denosumab (PROLIA) 60 MG/ML SOSY injection Inject 60 mg into the skin every 6 (six) months.  . hydroxychloroquine (PLAQUENIL) 200 MG tablet TAKE 1 TABLET BY MOUTH DAILY FOR RHEUMATOID ARTHRITIS  . Hypromellose (ARTIFICIAL TEARS OP) Apply to eye as needed.  . irbesartan (AVAPRO) 300 MG tablet TAKE 1 TABLET(300 MG) BY MOUTH DAILY  . metFORMIN (GLUCOPHAGE) 500 MG tablet Take 1 tablet (500 mg total) by mouth daily with breakfast.  . SYNTHROID 88 MCG tablet TAKE 1 TABLET BY MOUTH EVERY DAY MONDAY TO SATURDAY AND OFF ON SUNDAYS  . tacrolimus (PROTOPIC) 0.1 % ointment Apply topically daily.   No facility-administered encounter medications on file as of 04/22/2020.    Current Diagnosis/Assessment:  SDOH Interventions     Most Recent Value  SDOH Interventions  Financial Strain Interventions Other (Comment)  [Will assist patient with completing patient assistance application  for Synthroid]      Goals Addressed            This Visit's Progress   . Pharmacy Care Plan       CARE PLAN ENTRY (see longitudinal plan of care for additional care plan information)  Current Barriers:  . Chronic Disease Management support, education, and care coordination needs related to Hypertension, Hyperlipidemia, Diabetes, Asthma, Hypothyroidism, and Osteoporosis   Hypertension BP Readings from Last 3 Encounters:  03/20/20 (!) 160/78  12/26/19 (!) 155/64  11/29/19 (!) 144/73   . Pharmacist Clinical Goal(s): o Over the next 90 days, patient will work with PharmD and providers to achieve BP goal <130/80 . Current regimen:  o Amlodipine 2.40m every morning and 521mevery evening o Irbesartan 30073maily . Interventions: o Patient has been able to receive amlodipine 2.5mg81md 5mg 51mlets and is appreciative she does not have to cut 5mg t28mets in half anymore o Provided dietary and exercise recommendations . Patient self care activities - Over the next 14 days, patient will: o Check BP daily, document, and provide at future appointments o Ensure daily salt intake < 2300 mg/day o Exercise for at least 30 minutes per day 5 days per week (150 minutes per week)  Hyperlipidemia Lab Results  Component Value Date/Time   [facility]/10/2019 04:35 PM   . Pharmacist Clinical Goal(s): o Over the next 90 days, patient will work with PharmD  and providers to maintain LDL goal < 70 . Current regimen:  o Atorvastatin 80 mg daily . Interventions: o Provided dietary and exercise recommendations . Patient self care activities - Over the next 90 days, patient will: o Take cholesterol medication daily as directed o Exercise for at least 30 minutes per day 5 days per week (150 minutes per week)  Diabetes Lab Results  Component Value Date/Time   HGBA1C 5.7 (H) 03/20/2020 04:45 PM   HGBA1C 5.6 10/31/2019 04:35 PM   . Pharmacist Clinical Goal(s): o Over the next 90 days, patient  will work with PharmD and providers to maintain A1c goal <7% . Current regimen:  o Metformin 548m daily . Interventions: o Provided dietary and exercise recommendations . Patient self care activities - Over the next 90 days, patient will: o Check blood sugar several times per week, document, and provide at future appointments o Contact provider with any episodes of hypoglycemia o Exercise for at least 30 minutes per day 5 days per week (150 minutes per week)  Hypothyroidism . Pharmacist Clinical Goal(s) o Over the next 90 days, patient will work with PharmD and providers to obtain Synthroid medication . Current regimen:  o Synthroid 871m daily Monday through Saturday, skip Sundays . Interventions: o Discussed patient assistance program for Synthroid through [employer] of required documents for application (proof of income and medication out of pocket spending report from pharmacy) . Patient self care activities - Over the next 90 days, patient will: o Continue taking Synthroid daily as directed o Bring required documents to next office visit to complete patient assistance application  Medication management . Pharmacist Clinical Goal(s): o Over the next 90 days, patient will work with PharmD and providers to achieve optimal medication adherence . Current pharmacy: [facility] . Interventions o Comprehensive medication review performed. o Continue current medication management strategy . Patient self care activities - Over the next 90 days, patient will: o Focus on medication adherence by considering use of pill box to aide in organization of medication o Take medications as prescribed o Report any questions or concerns to PharmD and/or provider(s)  Please see past updates related to this goal by clicking on the "Past Updates" button in the selected goal         Diabetes   Recent Relevant Labs: Lab Results  Component Value Date/Time   HGBA1C 5.7 (H)  03/20/2020 04:45 PM   HGBA1C 5.6 10/31/2019 04:35 PM   EGFR 44 (L) 08/12/2017 08:08 AM   EGFR 50 (L) 02/16/2017 08:29 AM   MICROALBUR 80 10/31/2019 12:13 PM   MICROALBUR 80 06/15/2019 06:11 PM    Kidney Function Lab Results  Component Value Date/Time   CREATININE 1.24 (H) 04/16/2020 03:16 PM   CREATININE 1.06 (H) 12/26/2019 11:02 AM   CREATININE 0.98 (H) 12/08/2019 02:12 PM   CREATININE 1.4 (H) 08/12/2017 08:08 AM   CREATININE 1.2 (H) 02/16/2017 08:29 AM   GFRNONAA 43 (L) 04/16/2020 03:16 PM   GFRAA 50 (L) 04/16/2020 03:16 PM   Checking BG: Three times weekly  Recent FBG Readings: 79, 95, 97, 87 Recent pre-meal BG readings:  Recent 2hr PP BG readings:   Recent HS BG readings:  Patient has failed these meds in past: N/A Patient is currently controlled on the following medications:   Metformin 50046maily with breakfast  Last diabetic Foot exam: 10/31/19 Last diabetic Eye exam: Lab Results  Component Value Date/Time   HMDIABEYEEXA No Retinopathy 08/17/2019 08:58 AM  We discussed:   Diet extensively  Joined [employer] the first of June and has lost 7 lbs  Focusing on staying hydrated and eating more vegetables  Exercise extensively  Pt currently does 2 days of yoga and 2 days of strength training classes per week (45 minutes each class)  Congratulated pt on current activity level and encouraged her to continue with a minimum goal of 30 minutes 5 times per week  Has trouble walking due to knee pain  Plan Continue current medications    Hypertension   Office blood pressures are  BP Readings from Last 3 Encounters:  04/16/20 140/79  04/02/20 132/64  03/26/20 138/72   Patient has failed these meds in the past: Azilsartan, furosemide, Edarbyclor, olmesartan/HCTZ, valsartan/HCTZ, valsartan Patient is currently uncontrolled on the following medications:   Amlodipine 2.74m in the morning, 536min the evening  Irbesartan 30017maily  Patient checks BP at  home daily  Patient home BP readings are ranging: 125/71, 138/75, 154/82, 130/81  We discussed:  Diet extensively  Threw away her salt and is now using Mrs. Dash  Recommend checking nutrition label for salt content  Exercise extensively  Recommend pt continue checking BP everyday   Discussed goal of 130/80  Getting the 2.5mg39md the 5mg 22modipine, pt states it is helping not having to cut the 5mg t83met in half  Plan Continue current medications   Hyperlipidemia   Lipid Panel     Component Value Date/Time   CHOL 134 10/31/2019 1635   TRIG 70 10/31/2019 1635   HDL 67 10/31/2019 1635   LDLCALC 53 10/31/2019 1635    The 10-year ASCVD risk score (Goff Mikey Bussing., et al., 2013) is: 27.4%   Values used to calculate the score:     Age: 54 yea48     Sex: Female     Is Non-Hispanic African American: Yes     Diabetic: Yes     Tobacco smoker: No     Systolic Blood Pressure: 140 mm811    Is BP treated: Yes     HDL Cholesterol: 67 mg/dL     Total Cholesterol: 134 mg/dL   Patient has failed these meds in past: N/A Patient is currently controlled on the following medications:   Atorvastatin 80mg d80m  Aspirin 81mg da37m We discussed:   Diet and exercise extensively  Plan Continue current medications   COPD / Asthma / Tobacco   Last spirometry score: 03/22/15 FEV1 1.02 (42%)  Gold Grade: Gold 1 (FEV1>80%), last spirometry would suggest more severe grade, but pt states significant symptoms improvement since moving to [place] Current COPD Classification:  A (low sx, <2 exacerbations/yr)  Eosinophil count:   Lab Results  Component Value Date/Time   EOSPCT 6.4 04/16/2020 03:16 PM   EOSPCT 9.2 (H) 08/12/2017 08:08 AM  %                               Eos (Absolute):  Lab Results  Component Value Date/Time   EOSABS 358 04/16/2020 03:16 PM   EOSABS 0.4 08/12/2017 08:08 AM   Tobacco Status:  Social History   Tobacco Use  Smoking Status Never Smoker  Smokeless  Tobacco Never Used   Patient has failed these meds in past: Theophylline, Duoneb, Advair, Zafirlukast Patient is currently controlled on the following medications:   Symbicort 160/4.5mcg/act48mpuffs twice daily  Albuterol 2 puffs every 6 hours as needed  Using maintenance inhaler regularly? No Frequency of rescue inhaler use:  infrequently  Plan Continue current medications  Hypothyroidism   Lab Results  Component Value Date/Time   TSH 1.140 03/20/2020 04:45 PM   TSH 0.498 10/31/2019 04:39 PM   TSH 0.740 06/15/2019 12:22 PM    Patient has failed these meds in past: generic levothyroxine Patient is currently controlled on the following medications:   Synthroid 42mg daily Monday through Saturday, skip Sundays  We discussed:    Patient assistance through [employer]  Pt is scheduled to come into the office on 7/29 to complete patient assistance application and provide proof of income and medication spending documentation  Plan Continue current medications   Rheumatoid Arthritis   Patient has failed these meds in past: N/A Patient is currently controlled on the following medications:  -Hydroxychloroquine 200mg daily  Plan Continue current medications   Osteoarthritis    Patient has failed these meds in past: Naproxen Patient is currently controlled on the following medications: N/A  Plan Continue current treatment strategies  Osteopenia / Osteoporosis   Last DEXA Scan: 07/30/18  T-Score femoral neck: Right -2.7, Left -2.5  T-Score total hip:   T-Score lumbar spine:   T-Score forearm radius:   10-year probability of major osteoporotic fracture: 30%  10-year probability of hip fracture: 10%  Vit D, 25-Hydroxy  Date Value Ref Range Status  11/14/2019 41 30 - 100 ng/mL Final    Comment:    Vitamin D Status         25-OH Vitamin D: . Deficiency:                    <20 ng/mL Insufficiency:             20  - 29 ng/mL Optimal:                 > or = 30  ng/mL . For 25-OH Vitamin D testing on patients on  D2-supplementation and patients for whom quantitation  of D2 and D3 fractions is required, the Quest[facility]ureD(TM) 25-OH VIT D, (D2,D3), LC/MS/MS is recommended: order  code [id number](patients >215yr. See Note 1 . Note 1 . For additional information, please refer to  http://education.[facility].com/faq/FAQ199  (This link is being provided for informational/ educational purposes only.)      Patient is a candidate for pharmacologic treatment due to T-Score < -2.5 in femoral neck  Patient has failed these meds in past: Fosamax Patient is currently controlled on the following medications:   Prolia injection 6027mvery 6 months  Cholecalciferol 2000 units daily  Calcium carbonate/cholecalciferol 600m8m0units daily  We discussed:  Recommend weight-bearing and muscle strengthening exercises for building and maintaining bone density.  Plan Continue current medications   Anemia   Patient is currently controlled on the following medications:   Iron polysaccharide 150mg41me daily  Plan Continue current medications  Gout   Patient has failed these meds in past: N/A Patient is currently controlled on the following medications:  -Allopurinol 100mg 15my  Plan Continue current medications  Vitiligo   Patient has failed these meds in past: N/A Patient is currently controlled on the following medications:   Tacrolimus 0.1% ointment applied daily  Plan Continue current medications  Vaccines   Reviewed and discussed patient's vaccination history.    Immunization History  Administered Date(s) Administered  . 19-influenza Whole 07/15/2012  . DTaP 06/18/2019  . Influenza Split 06/28/2014  . Influenza, High Dose Seasonal PF 05/18/2019  . Influenza,inj,Quad PF,6+  Mos 06/20/2018  . Moderna SARS-COVID-2 Vaccination 11/20/2019, 12/19/2019  . Pneumococcal Conjugate-13 05/04/2018  . Pneumococcal Polysaccharide-23  05/18/2019  . Pneumococcal-Unspecified 06/28/2014  . Tdap 06/15/2019    Plan Discuss Shingrix at follow up visit  Medication Management   Pt uses [facility] for all medications Uses pill box? No - Only uses when traveling Pt endorses 100% compliance  We discussed:   Importance of taking medications daily as directed  Plan Continue current medication management strategy    Follow up: 3 day office visit to complete PAP paperwork  Jannette Fogo, PharmD Clinical Pharmacist [facility] [phone]

## 2020-04-25 ENCOUNTER — Telehealth: Payer: Self-pay

## 2020-04-25 ENCOUNTER — Ambulatory Visit: Payer: Medicare HMO

## 2020-04-25 ENCOUNTER — Other Ambulatory Visit: Payer: Self-pay

## 2020-04-25 DIAGNOSIS — I10 Essential (primary) hypertension: Secondary | ICD-10-CM | POA: Diagnosis not present

## 2020-04-25 DIAGNOSIS — E039 Hypothyroidism, unspecified: Secondary | ICD-10-CM

## 2020-04-25 NOTE — Patient Instructions (Addendum)
Visit Information  Goals Addressed            This Visit's Progress   . Pharmacy Care Plan       CARE PLAN ENTRY (see longitudinal plan of care for additional care plan information)  Current Barriers:  . Chronic Disease Management support, education, and care coordination needs related to Hypertension, Hyperlipidemia, Diabetes, Asthma, Hypothyroidism, and Osteoporosis   Hypertension BP Readings from Last 3 Encounters:  03/20/20 (!) 160/78  12/26/19 (!) 155/64  11/29/19 (!) 144/73   . Pharmacist Clinical Goal(s): o Over the next 90 days, patient will work with PharmD and providers to achieve BP goal <130/80 . Current regimen:  o Amlodipine 2.5mg  every morning and 5mg  every evening o Irbesartan 300mg  daily . Interventions: o Patient has been able to receive amlodipine 2.5mg  and 5mg  tablets and is appreciative she does not have to cut 5mg  tablets in half anymore o Provided dietary and exercise recommendations . Patient self care activities - Over the next 14 days, patient will: o Check BP daily, document, and provide at future appointments o Ensure daily salt intake < 2300 mg/day o Exercise for at least 30 minutes per day 5 days per week (150 minutes per week)  Hyperlipidemia Lab Results  Component Value Date/Time   LDLCALC 53 10/31/2019 04:35 PM   . Pharmacist Clinical Goal(s): o Over the next 90 days, patient will work with PharmD and providers to maintain LDL goal < 70 . Current regimen:  o Atorvastatin 80 mg daily . Interventions: o Provided dietary and exercise recommendations . Patient self care activities - Over the next 90 days, patient will: o Take cholesterol medication daily as directed o Exercise for at least 30 minutes per day 5 days per week (150 minutes per week)  Diabetes Lab Results  Component Value Date/Time   HGBA1C 5.7 (H) 03/20/2020 04:45 PM   HGBA1C 5.6 10/31/2019 04:35 PM   . Pharmacist Clinical Goal(s): o Over the next 90 days, patient will  work with PharmD and providers to maintain A1c goal <7% . Current regimen:  o Metformin 500mg  daily . Interventions: o Provided dietary and exercise recommendations . Patient self care activities - Over the next 90 days, patient will: o Check blood sugar several times per week, document, and provide at future appointments o Contact provider with any episodes of hypoglycemia o Exercise for at least 30 minutes per day 5 days per week (150 minutes per week)  Hypothyroidism . Pharmacist Clinical Goal(s) o Over the next 90 days, patient will work with PharmD and providers to obtain Synthroid medication . Current regimen:  o Synthroid 56mcg daily Monday through Saturday, skip Sundays . Interventions: o Discussed patient assistance program for Synthroid through [employer] patient of required documents for application (proof of income and medication out of pocket spending report from pharmacy) . Patient self care activities - Over the next 90 days, patient will: o Continue taking Synthroid daily as directed o Bring required documents to next office visit to complete patient assistance application  Medication management . Pharmacist Clinical Goal(s): o Over the next 90 days, patient will work with PharmD and providers to achieve optimal medication adherence . Current pharmacy: [facility] . Interventions o Comprehensive medication review performed. o Continue current medication management strategy . Patient self care activities - Over the next 90 days, patient will: o Focus on medication adherence by considering use of pill box to aide in organization of medication o Take medications as prescribed o  Report any questions or concerns to PharmD and/or provider(s)  Please see past updates related to this goal by clicking on the "Past Updates" button in the selected goal         The patient verbalized understanding of instructions provided today and agreed to receive a mailed copy of  patient instruction and/or educational materials.  Face to Face appointment with pharmacist scheduled for: 04/25/20 @ 3:30 PM  Jannette Fogo, PharmD Clinical Pharmacist [facility] [phone]   DASH Eating Plan DASH stands for "Dietary Approaches to Stop Hypertension." The DASH eating plan is a healthy eating plan that has been shown to reduce high blood pressure (hypertension). It may also reduce your risk for type 2 diabetes, heart disease, and stroke. The DASH eating plan may also help with weight loss. What are tips for following this plan?  General guidelines  Avoid eating more than 2,300 mg (milligrams) of salt (sodium) a day. If you have hypertension, you may need to reduce your sodium intake to 1,500 mg a day.  Limit alcohol intake to no more than 1 drink a day for nonpregnant women and 2 drinks a day for men. One drink equals 12 oz of beer, 5 oz of wine, or 1 oz of hard liquor.  Work with your health care provider to maintain a healthy body weight or to lose weight. Ask what an ideal weight is for you.  Get at least 30 minutes of exercise that causes your heart to beat faster (aerobic exercise) most days of the week. Activities may include walking, swimming, or biking.  Work with your health care provider or diet and nutrition specialist (dietitian) to adjust your eating plan to your individual calorie needs. Reading food labels   Check food labels for the amount of sodium per serving. Choose foods with less than 5 percent of the Daily Value of sodium. Generally, foods with less than 300 mg of sodium per serving fit into this eating plan.  To find whole grains, look for the word "whole" as the first word in the ingredient list. Shopping  Buy products labeled as "low-sodium" or "no salt added."  Buy fresh foods. Avoid canned foods and premade or frozen meals. Cooking  Avoid adding salt when cooking. Use salt-free seasonings or herbs instead of  table salt or sea salt. Check with your health care provider or pharmacist before using salt substitutes.  Do not fry foods. Cook foods using healthy methods such as baking, boiling, grilling, and broiling instead.  Cook with heart-healthy oils, such as olive, canola, soybean, or sunflower oil. Meal planning  Eat a balanced diet that includes: ? 5 or more servings of fruits and vegetables each day. At each meal, try to fill half of your plate with fruits and vegetables. ? Up to 6-8 servings of whole grains each day. ? Less than 6 oz of lean meat, poultry, or fish each day. A 3-oz serving of meat is about the same size as a deck of cards. One egg equals 1 oz. ? 2 servings of low-fat dairy each day. ? A serving of nuts, seeds, or beans 5 times each week. ? Heart-healthy fats. Healthy fats called Omega-3 fatty acids are found in foods such as flaxseeds and coldwater fish, like sardines, salmon, and mackerel.  Limit how much you eat of the following: ? Canned or prepackaged foods. ? Food that is high in trans fat, such as fried foods. ? Food that is high in saturated fat, such as  fatty meat. ? Sweets, desserts, sugary drinks, and other foods with added sugar. ? Full-fat dairy products.  Do not salt foods before eating.  Try to eat at least 2 vegetarian meals each week.  Eat more home-cooked food and less restaurant, buffet, and fast food.  When eating at a restaurant, ask that your food be prepared with less salt or no salt, if possible. What foods are recommended? The items listed may not be a complete list. Talk with your dietitian about what dietary choices are best for you. Grains Whole-grain or whole-wheat bread. Whole-grain or whole-wheat pasta. Brown rice. [place]row. Bulgur. Whole-grain and low-sodium cereals. Pita bread. Low-fat, low-sodium crackers. Whole-wheat flour tortillas. Vegetables Fresh or frozen vegetables (raw, steamed, roasted, or grilled). Low-sodium or  reduced-sodium tomato and vegetable juice. Low-sodium or reduced-sodium tomato sauce and tomato paste. Low-sodium or reduced-sodium canned vegetables. Fruits All fresh, dried, or frozen fruit. Canned fruit in natural juice (without added sugar). Meat and other protein foods Skinless chicken or [place]. Ground chicken or [place]. Pork with fat trimmed off. Fish and seafood. Egg whites. Dried beans, peas, or lentils. Unsalted nuts, nut butters, and seeds. Unsalted canned beans. Lean cuts of beef with fat trimmed off. Low-sodium, lean deli meat. Dairy Low-fat (1%) or fat-free (skim) milk. Fat-free, low-fat, or reduced-fat cheeses. Nonfat, low-sodium ricotta or cottage cheese. Low-fat or nonfat yogurt. Low-fat, low-sodium cheese. Fats and oils Soft margarine without trans fats. Vegetable oil. Low-fat, reduced-fat, or light mayonnaise and salad dressings (reduced-sodium). Canola, safflower, olive, soybean, and sunflower oils. Avocado. Seasoning and other foods Herbs. Spices. Seasoning mixes without salt. Unsalted popcorn and pretzels. Fat-free sweets. What foods are not recommended? The items listed may not be a complete list. Talk with your dietitian about what dietary choices are best for you. Grains Baked goods made with fat, such as croissants, muffins, or some breads. Dry pasta or rice meal packs. Vegetables Creamed or fried vegetables. Vegetables in a cheese sauce. Regular canned vegetables (not low-sodium or reduced-sodium). Regular canned tomato sauce and paste (not low-sodium or reduced-sodium). Regular tomato and vegetable juice (not low-sodium or reduced-sodium). Angie Fava. Olives. Fruits Canned fruit in a light or heavy syrup. Fried fruit. Fruit in cream or butter sauce. Meat and other protein foods Fatty cuts of meat. Ribs. Fried meat. Berniece Salines. Sausage. Bologna and other processed lunch meats. Salami. Fatback. Hotdogs. Bratwurst. Salted nuts and seeds. Canned beans with added salt. Canned or  smoked fish. Whole eggs or egg yolks. Chicken or [place] with skin. Dairy Whole or 2% milk, cream, and half-and-half. Whole or full-fat cream cheese. Whole-fat or sweetened yogurt. Full-fat cheese. Nondairy creamers. Whipped toppings. Processed cheese and cheese spreads. Fats and oils Butter. Stick margarine. Lard. Shortening. Ghee. Bacon fat. Tropical oils, such as coconut, palm kernel, or palm oil. Seasoning and other foods Salted popcorn and pretzels. Onion salt, garlic salt, seasoned salt, table salt, and sea salt. Worcestershire sauce. Tartar sauce. Barbecue sauce. Teriyaki sauce. Soy sauce, including reduced-sodium. Steak sauce. Canned and packaged gravies. Fish sauce. Oyster sauce. Cocktail sauce. Horseradish that you find on the shelf. Ketchup. Mustard. Meat flavorings and tenderizers. Bouillon cubes. Hot sauce and Tabasco sauce. Premade or packaged marinades. Premade or packaged taco seasonings. Relishes. Regular salad dressings. Where to find more information:  National Heart, Lung, and [place]: https://wilson-eaton.com/  American Heart Association: www.heart.org Summary  The DASH eating plan is a healthy eating plan that has been shown to reduce high blood pressure (hypertension). It may also reduce your risk for  type 2 diabetes, heart disease, and stroke.  With the DASH eating plan, you should limit salt (sodium) intake to 2,300 mg a day. If you have hypertension, you may need to reduce your sodium intake to 1,500 mg a day.  When on the DASH eating plan, aim to eat more fresh fruits and vegetables, whole grains, lean proteins, low-fat dairy, and heart-healthy fats.  Work with your health care provider or diet and nutrition specialist (dietitian) to adjust your eating plan to your individual calorie needs. This information is not intended to replace advice given to you by your health care provider. Make sure you discuss any questions you have with your health care provider. Document  Revised: 08/27/2017 Document Reviewed: 09/07/2016 Elsevier Patient Education  2020 [employer].

## 2020-04-25 NOTE — Chronic Care Management (AMB) (Signed)
   Chronic Care Management Pharmacy  Name: Hannah [name]  MRN: [id number] DOB: [birth year]  Chief Complaint/ HPI  [name],  74 y.o. , female presents for their Follow-Up CCM visit with the clinical pharmacist to complete patient assistance paperwork for Synthroid.  PCP : Glendale Chard, MD  We discussed Synthroid patient assistance program available through [employer]. Patient provided copies of required proof of income documents and medication spending report for 2021. Patient verified information on application was correct and signed application. Application will be provided to PCP to sign and will then be faxed to [place].  SDOH Interventions     Most Recent Value  SDOH Interventions  Financial Strain Interventions Other (Comment)  [Assisted patient with completing Synthroid patient assistance application]     Goals Addressed            This Visit's Progress   . Synthroid Patient Assistance       CARE PLAN ENTRY (see longitudinal plan of care for additional care plan information)  Current Barriers:  . Financial Barriers: patient has [employer] and reports copay for Synthroid is cost prohibitive at this time  Pharmacist Clinical Goal(s):  [name] Over the next 30 days, patient will work with PharmD and providers to relieve medication access concerns  Interventions: . Comprehensive medication review completed; medication list updated in electronic medical record.  Bertram Savin care team collaboration (see longitudinal plan of care) . Synthroid by [employer]: Patient meets income criteria for this medication's patient assistance program. Reviewed application process. Patient provided proof of income, out of pocket spend report, and signed application. Will collaborate with primary care provider Dr. Glendale Chard for their portion of application. Once completed, will submit to [facility] patient assistance program.  Patient Self Care Activities:  . Patient  provided necessary portions of application  . Samples given to patient today in the office  Initial goal documentation       Follow up: 3 month phone visit  Jannette Fogo, PharmD Clinical Pharmacist [facility] [phone]

## 2020-04-25 NOTE — Patient Instructions (Signed)
Visit Information  Goals Addressed            This Visit's Progress   . Synthroid Patient Assistance       CARE PLAN ENTRY (see longitudinal plan of care for additional care plan information)  Current Barriers:  . Financial Barriers: patient has [employer] and reports copay for Synthroid is cost prohibitive at this time  Pharmacist Clinical Goal(s):  [name] Over the next 30 days, patient will work with PharmD and providers to relieve medication access concerns  Interventions: . Comprehensive medication review completed; medication list updated in electronic medical record.  Bertram Savin care team collaboration (see longitudinal plan of care) . Synthroid by [employer]: Patient meets income criteria for this medication's patient assistance program. Reviewed application process. Patient provided proof of income, out of pocket spend report, and signed application. Will collaborate with primary care provider Dr. Glendale Chard for their portion of application. Once completed, will submit to [facility] patient assistance program.  Patient Self Care Activities:  . Patient provided necessary portions of application  . Samples given to patient today in the office  Initial goal documentation        Patient verbalizes understanding of instructions provided today.   Telephone follow up appointment with pharmacy team member scheduled for: 08/01/20 @ 3:30PM  Jannette Fogo, PharmD Clinical Pharmacist [facility] [phone]

## 2020-04-25 NOTE — Chronic Care Management (AMB) (Addendum)
Patient has an appt with Jannette Fogo, CPP today @ 3:30pm to help with Patient Assistance for brand name Synthroid. Used Rx[employer]ist to look up PAP application, there were 2 options, [employer] for Synthroid and [employer]. [employer] only offers generic Levothyroxine. Printed and filled out appropriate parts of [employer] PAP application, transferring to [employer] to have when patient arrives.  Pattricia Boss, CPA

## 2020-04-29 ENCOUNTER — Telehealth: Payer: Self-pay | Admitting: Pharmacy Technician

## 2020-04-29 NOTE — Telephone Encounter (Signed)
Submitted a Prior Authorization request to [facility] for [place] via Cover My Meds. Will update once we receive a response.   (Key: [id number]) - [id number]

## 2020-05-01 ENCOUNTER — Telehealth: Payer: Self-pay

## 2020-05-01 DIAGNOSIS — M47814 Spondylosis without myelopathy or radiculopathy, thoracic region: Secondary | ICD-10-CM | POA: Diagnosis not present

## 2020-05-01 DIAGNOSIS — M5137 Other intervertebral disc degeneration, lumbosacral region: Secondary | ICD-10-CM | POA: Diagnosis not present

## 2020-05-01 DIAGNOSIS — M9902 Segmental and somatic dysfunction of thoracic region: Secondary | ICD-10-CM | POA: Diagnosis not present

## 2020-05-01 DIAGNOSIS — M9905 Segmental and somatic dysfunction of pelvic region: Secondary | ICD-10-CM | POA: Diagnosis not present

## 2020-05-01 DIAGNOSIS — M9903 Segmental and somatic dysfunction of lumbar region: Secondary | ICD-10-CM | POA: Diagnosis not present

## 2020-05-01 DIAGNOSIS — M5136 Other intervertebral disc degeneration, lumbar region: Secondary | ICD-10-CM | POA: Diagnosis not present

## 2020-05-06 ENCOUNTER — Ambulatory Visit: Payer: Medicare HMO | Admitting: Cardiovascular Disease

## 2020-05-06 ENCOUNTER — Other Ambulatory Visit: Payer: Self-pay

## 2020-05-06 ENCOUNTER — Encounter: Payer: Self-pay | Admitting: Cardiovascular Disease

## 2020-05-06 VITALS — BP 146/76 | HR 76 | Ht 59.0 in | Wt 156.8 lb

## 2020-05-06 DIAGNOSIS — I10 Essential (primary) hypertension: Secondary | ICD-10-CM

## 2020-05-06 DIAGNOSIS — I493 Ventricular premature depolarization: Secondary | ICD-10-CM | POA: Diagnosis not present

## 2020-05-06 NOTE — Patient Instructions (Signed)
Medication Instructions:  Your physician recommends that you continue on your current medications as directed. Please refer to the Current Medication list given to you today.  *If you need a refill on your cardiac medications before your next appointment, please call your pharmacy*   Lab Work: None Ordered If you have labs (blood work) drawn today and your tests are completely normal, you will receive your results only by: MyChart Message (if you have MyChart) OR A paper copy in the mail If you have any lab test that is abnormal or we need to change your treatment, we will call you to review the results.   Testing/Procedures: None Ordered   Follow-Up: At [facility], you and your health needs are our priority.  As part of our continuing mission to provide you with exceptional heart care, we have created designated Provider Care Teams.  These Care Teams include your primary Cardiologist (physician) and Advanced Practice Providers (APPs -  Physician Assistants and Nurse Practitioners) who all work together to provide you with the care you need, when you need it.    Your next appointment:     As Needed  The format for your next appointment:   In Person or Virtual  Provider:   You may see Philip Nahser, MD or one of the following Advanced Practice Providers on your designated Care Team:   Scott Weaver, PA-C Vin Bhagat, PA-C    

## 2020-05-06 NOTE — Telephone Encounter (Signed)
Received notification from [facility] regarding a prior authorization for [place]. Authorization has been APPROVED from 09/29/19 to 09/27/20.   Authorization # [id number]  Phone # [phone]  Patient's Hannah [name] is still active through 05/18/2020.

## 2020-05-06 NOTE — Progress Notes (Signed)
Cardiology Office Note:    Date:  05/06/2020   ID:  Hannah, [name] Jul 29, 1946, MRN [id number]  PCP:  Glendale Chard, MD  [facility] Cardiologist:  New to [place] Electrophysiologist:  None   Referring MD: Glendale Chard, MD   Chief Complaint  Patient presents with  . Palpitations    History of Present Illness:    Hannah [name] is a 74 y.o. female with a hx of palpitations.   We are asked to see her by Dr. Baird Cancer for further evaluation of these palpitations.   Hx of hyperlipidemia, hy[pothyroidism, HTN    These palpitations occur randomly.  Typically at night when she is trying to go to sleep Occurs rarely.  started 4 months ago.   Last episode  Was a month ago  Typically at night when she is in bed.   No dizziness,  No sweats.  Single isolated HR irreg.  Exercises 4 times a week.   Not limited by palpitations  ECGs have been normal  TSH is normal and has been stable   Has had both covid vaccines Sees hematology for mild anemia    Past Medical History:  Diagnosis Date  . Anemia   . Arthritis   . Asthma   . Diabetes mellitus without complication ([place])   . Gout 12/17/2014   patient reported  . Hyperlipidemia   . Hypertension   . Systemic lupus erythematosus ([place])     Past Surgical History:  Procedure Laterality Date  . CATARACT EXTRACTION Bilateral 2015  . DILATION AND CURETTAGE OF UTERUS    . DOPPLER ECHOCARDIOGRAPHY  05/2018   Internist to review with pt; potential heart murmur 06/20/18  . keratosis removal  2021  . SKIN SURGERY  11/30/2018   left side of face    Current Medications: Current Meds  Medication Sig  . albuterol (PROVENTIL HFA;VENTOLIN HFA) 108 (90 Base) MCG/ACT inhaler Inhale 2 puffs into the lungs every 6 (six) hours as needed for wheezing or shortness of breath.  . Alcohol Swabs (ALCOHOL PADS) 70 % PADS Use as directed to check blood sugars 1 time per day dx: e11.22  . allopurinol (ZYLOPRIM) 100 MG tablet  TAKE 1 TABLET BY MOUTH EVERY DAY FOR GOUT  . amLODipine (NORVASC) 2.5 MG tablet Take 1 tablet (2.5 mg total) by mouth daily.  [name] amLODipine (NORVASC) 5 MG tablet Take 1 tablet (5 mg total) by mouth daily. 1/2 tab in the am and 1 tab in the pm  . aspirin EC 81 MG tablet Take 81 mg by mouth daily.  [name] atorvastatin (LIPITOR) 80 MG tablet TAKE 1 TABLET BY MOUTH EVERY DAY  . Blood Glucose Calibration (ACCU-CHEK AVIVA) SOLN   . Blood Glucose Monitoring Suppl (ACCU-CHEK AVIVA PLUS) w/Device KIT Use as directed to check blood sugars 1 time per day dx: e11.22  . budesonide-formoterol (SYMBICORT) 160-4.5 MCG/ACT inhaler INHALE 2 PUFFS BY MOUTH TWICE DAILY IN THE MORNING AND EVENING  . Calcium Carb-Cholecalciferol (CALCIUM 600+D) 600-800 MG-UNIT TABS Take 1 tablet by mouth daily.   . Cholecalciferol (VITAMIN D PO) Take 2,000 Units by mouth daily.   [name] denosumab (PROLIA) 60 MG/ML SOSY injection Inject 60 mg into the skin every 6 (six) months.  . hydroxychloroquine (PLAQUENIL) 200 MG tablet TAKE 1 TABLET BY MOUTH DAILY FOR RHEUMATOID ARTHRITIS  . Hypromellose (ARTIFICIAL TEARS OP) Apply to eye as needed.  . irbesartan (AVAPRO) 300 MG tablet TAKE 1 TABLET(300 MG) BY MOUTH DAILY  .  metFORMIN (GLUCOPHAGE) 500 MG tablet Take 1 tablet (500 mg total) by mouth daily with breakfast.  . SYNTHROID 88 MCG tablet TAKE 1 TABLET BY MOUTH EVERY DAY MONDAY TO SATURDAY AND OFF ON SUNDAYS  . tacrolimus (PROTOPIC) 0.1 % ointment Apply topically daily.     Allergies:   Shellfish allergy   Social History   Socioeconomic History  . Marital status: Single    Spouse name: Not on file  . Number of children: 0  . Years of education: Not on file  . Highest education level: Not on file  Occupational History  . Occupation: retired  Tobacco Use  . Smoking status: Never Smoker  . Smokeless tobacco: Never Used  Vaping Use  . Vaping Use: Never used  Substance and Sexual Activity  . Alcohol use: Yes    Comment: rarely   . Drug  use: No  . Sexual activity: Not Currently    Comment: 1ST intercourse- 19 , partners- 6,   Other Topics Concern  . Not on file  Social History Narrative  . Not on file   Social Determinants of Health   Financial Resource Strain: Medium Risk  . Difficulty of Paying Living Expenses: Somewhat hard  Food Insecurity: No Food Insecurity  . Worried About Charity fundraiser in the Last Year: Never true  . Ran Out of Food in the Last Year: Never true  Transportation Needs: No Transportation Needs  . Lack of Transportation (Medical): No  . Lack of Transportation (Non-Medical): No  Physical Activity: Insufficiently Active  . Days of Exercise per Week: 4 days  . Minutes of Exercise per Session: 30 min  Stress: Stress Concern Present  . Feeling of Stress : To some extent  Social Connections:   . Frequency of Communication with Friends and Family:   . Frequency of Social Gatherings with Friends and Family:   . Attends Religious Services:   . Active Member of Clubs or Organizations:   . Attends Archivist Meetings:   [name] Marital Status:      Family History: The patient's family history includes Diabetes in her father; Heart disease in her father; Hypertension in her mother, sister, and sister; Leukemia in her sister.  ROS:   Please see the history of present illness.     All other systems reviewed and are negative.  EKGs/Labs/Other Studies Reviewed:    The following studies were reviewed today:   EKG:   03/20/20  - NSR , no arrhythmias .  Recent Labs: 03/20/2020: Magnesium 2.1; TSH 1.140 04/16/2020: ALT 15; BUN 13; Creat 1.24; Hemoglobin 11.3; Platelets 180; Potassium 4.8; Sodium 140  Recent Lipid Panel    Component Value Date/Time   CHOL 134 10/31/2019 1635   TRIG 70 10/31/2019 1635   HDL 67 10/31/2019 1635   CHOLHDL 2.0 10/31/2019 1635   LDLCALC 53 10/31/2019 1635    Physical Exam:    VS:  BP (!) 146/76   Pulse 76   Ht 4' 11"  (1.499 m)   Wt 156 lb 12.8 oz  (71.1 kg)   SpO2 96%   BMI 31.67 kg/m     Wt Readings from Last 3 Encounters:  05/06/20 156 lb 12.8 oz (71.1 kg)  04/16/20 161 lb 12.8 oz (73.4 kg)  04/02/20 160 lb 3.2 oz (72.7 kg)     GEN:  Elderly femake.  Moderately obese  HEENT: Normal NECK: No JVD; No carotid bruits LYMPHATICS: No lymphadenopathy CARDIAC: RRR, no murmurs, rubs, gallops RESPIRATORY:  Clear to auscultation without rales, wheezing or rhonchi  ABDOMEN: Soft, non-tender, non-distended MUSCULOSKELETAL:  No edema; No deformity  SKIN: Warm and dry NEUROLOGIC:  Alert and oriented x 3 PSYCHIATRIC:  Normal affect   ASSESSMENT:    No diagnosis found. PLAN:    In order of problems listed above:  1. Premature ventricular contractions: Mrs. Stitt presents with episodes of palpitations that clinically are consistent with premature ventricular contractions.  They are described as single isolated extra heartbeats that only last for a split second.  They typically occur at night.  They occur when she is at rest and does not do not occur when she is exercising.  She does yoga twice a week and then other exercises twice a week and has never had any limitations related to palpitations.  She denies any chest pain or dizziness.  I offered to place a monitor on her but I told her that I did not think it was required at this time.  These episodes are very rare and her last episode was a month ago.  Her TSH is normal.  Magnesium and potassium levels are normal.  I do not think that she needs any additional testing at this time.  I will see her again on an as-needed basis.   Medication Adjustments/Labs and Tests Ordered: Current medicines are reviewed at length with the patient today.  Concerns regarding medicines are outlined above.  No orders of the defined types were placed in this encounter.  No orders of the defined types were placed in this encounter.   Patient Instructions  Medication Instructions:  Your  physician recommends that you continue on your current medications as directed. Please refer to the Current Medication list given to you today.  *If you need a refill on your cardiac medications before your next appointment, please call your pharmacy*   Lab Work: None Ordered If you have labs (blood work) drawn today and your tests are completely normal, you will receive your results only by: [name] MyChart Message (if you have MyChart) OR . A paper copy in the mail If you have any lab test that is abnormal or we need to change your treatment, we will call you to review the results.   Testing/Procedures: None Ordered   Follow-Up: At [facility], you and your health needs are our priority.  As part of our continuing mission to provide you with exceptional heart care, we have created designated Provider Care Teams.  These Care Teams include your primary Cardiologist (physician) and Advanced Practice Providers (APPs -  Physician Assistants and Nurse Practitioners) who all work together to provide you with the care you need, when you need it.   Your next appointment:    As Needed  The format for your next appointment:   In Person or Virtual  Provider:   You may see Mertie Moores, MD or one of the following Advanced Practice Providers on your designated Care Team:    Richardson Dopp, PA-C  Robbie Lis, [place]         Signed, Mertie Moores, MD  05/06/2020 8:55 AM    [place]

## 2020-05-08 ENCOUNTER — Telehealth: Payer: Self-pay | Admitting: Rheumatology

## 2020-05-08 DIAGNOSIS — Z79899 Other long term (current) drug therapy: Secondary | ICD-10-CM

## 2020-05-08 NOTE — Telephone Encounter (Signed)
Patient called stating she received a letter from [facility] dated 05/01/20 approving her Prolia.  Patient states it was approved under Medicare Part B until 09/27/20.  Patient is requesting a return call to let her know when she should get labwork.

## 2020-05-09 NOTE — Telephone Encounter (Signed)
She is due for Prolia on or after 9/3.  She has a grant that will cover the cost of her injection and receives from [facility].  She will need to have labs 8/20.  Orders placed.  Once labs have resulted we can send prescription to the pharmacy.   Mariella Saa, PharmD, [place], CPP Clinical Specialty Pharmacist (Rheumatology and Pulmonology)  05/09/2020 8:56 AM

## 2020-05-09 NOTE — Telephone Encounter (Addendum)
Left message to advise she is due for Prolia on or after 9/3.  She has a grant that will cover the cost of her injection and receives from [facility].  Patient advised she will need to have labs 8/20.  Orders placed.

## 2020-05-15 ENCOUNTER — Telehealth: Payer: Self-pay

## 2020-05-15 NOTE — Chronic Care Management (AMB) (Addendum)
Chronic Care Management Pharmacy Assistant   Name: Hannah [name]  MRN: [id number] DOB: 28-Jun-1946  Reason for Encounter: Medication Review/ Patient Assistance Coordination  PCP : Glendale Chard, MD  Allergies:   Allergies  Allergen Reactions   Shellfish Allergy Anaphylaxis and Hives    Medications: Outpatient Encounter Medications as of 05/15/2020  Medication Sig   albuterol (PROVENTIL HFA;VENTOLIN HFA) 108 (90 Base) MCG/ACT inhaler Inhale 2 puffs into the lungs every 6 (six) hours as needed for wheezing or shortness of breath.   Alcohol Swabs (ALCOHOL PADS) 70 % PADS Use as directed to check blood sugars 1 time per day dx: e11.22   allopurinol (ZYLOPRIM) 100 MG tablet TAKE 1 TABLET BY MOUTH EVERY DAY FOR GOUT   amLODipine (NORVASC) 2.5 MG tablet Take 1 tablet (2.5 mg total) by mouth daily.   amLODipine (NORVASC) 5 MG tablet Take 1 tablet (5 mg total) by mouth daily. 1/2 tab in the am and 1 tab in the pm   aspirin EC 81 MG tablet Take 81 mg by mouth daily.   atorvastatin (LIPITOR) 80 MG tablet TAKE 1 TABLET BY MOUTH EVERY DAY   Blood Glucose Calibration (ACCU-CHEK AVIVA) SOLN    Blood Glucose Monitoring Suppl (ACCU-CHEK AVIVA PLUS) w/Device KIT Use as directed to check blood sugars 1 time per day dx: e11.22   budesonide-formoterol (SYMBICORT) 160-4.5 MCG/ACT inhaler INHALE 2 PUFFS BY MOUTH TWICE DAILY IN THE MORNING AND EVENING   Calcium Carb-Cholecalciferol (CALCIUM 600+D) 600-800 MG-UNIT TABS Take 1 tablet by mouth daily.    Cholecalciferol (VITAMIN D PO) Take 2,000 Units by mouth daily.    denosumab (PROLIA) 60 MG/ML SOSY injection Inject 60 mg into the skin every 6 (six) months.   hydroxychloroquine (PLAQUENIL) 200 MG tablet TAKE 1 TABLET BY MOUTH DAILY FOR RHEUMATOID ARTHRITIS   Hypromellose (ARTIFICIAL TEARS OP) Apply to eye as needed.   irbesartan (AVAPRO) 300 MG tablet TAKE 1 TABLET(300 MG) BY MOUTH DAILY   metFORMIN (GLUCOPHAGE) 500 MG tablet Take 1 tablet (500 mg  total) by mouth daily with breakfast.   SYNTHROID 88 MCG tablet TAKE 1 TABLET BY MOUTH EVERY DAY MONDAY TO SATURDAY AND OFF ON SUNDAYS   tacrolimus (PROTOPIC) 0.1 % ointment Apply topically daily.   No facility-administered encounter medications on file as of 05/15/2020.    Current Diagnosis: Patient Active Problem List   Diagnosis Date Noted   PVC (premature ventricular contraction) 05/06/2020   Type 2 diabetes mellitus with stage 3 chronic kidney disease, without long-term current use of insulin ([place]) 03/20/2020   Palpitations 03/20/2020   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/2020   Primary hypothyroidism 03/20/2020   Vitiligo 03/20/2020   Iron deficiency anemia 08/12/2017   Osteoporosis 10/02/2016   Systemic lupus erythematosus ([place]) 10/02/2016   Rheumatoid arthritis involving multiple sites with positive rheumatoid factor ([place]) 10/02/2016   High risk medication use 10/02/2016   History of chronic kidney disease 10/02/2016   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/2016   Primary osteoarthritis of both knees 10/02/2016   Vitamin D deficiency 10/02/2016   History of diabetes mellitus 10/02/2016   History of hypertension 10/02/2016   History of asthma 10/02/2016   Absolute anemia    MGUS (monoclonal gammopathy of unknown significance)    Normocytic anemia 03/21/2015   Essential hypertension 03/20/2015   Abnormal CT of the chest 07/30/2012   Asthma 06/19/2012     Follow-Up:  Patient Assistance Coordination- Patient Assistance application started on 06/05/3381 for brand name  Synthroid, received correspondence on 05/06/20 that additional out of pocket expenses were needed. Spoke with Symaria from [facility] and they are faxing over a out of pocket expense form for patient to fill out. Jannette Fogo, CPP notified. CPP spoke with patient via telephone on 05/16/20 and notified pt of out of pocket expense form needed for patient assistance application. Scheduled  appointment for pt to come into the office and fill out form on 05/23/20. Pt aware to bring documentation for medical out of pocket expenses.   Pattricia Boss, [place] Pharmacist Assistant [phone]

## 2020-05-16 ENCOUNTER — Encounter: Payer: Self-pay | Admitting: Internal Medicine

## 2020-05-17 ENCOUNTER — Other Ambulatory Visit: Payer: Self-pay

## 2020-05-17 DIAGNOSIS — Z79899 Other long term (current) drug therapy: Secondary | ICD-10-CM

## 2020-05-17 LAB — COMPLETE METABOLIC PANEL WITH GFR
AG Ratio: 1.8 (calc) (ref 1.0–2.5)
ALT: 16 U/L (ref 6–29)
AST: 19 U/L (ref 10–35)
Albumin: 4 g/dL (ref 3.6–5.1)
Alkaline phosphatase (APISO): 43 U/L (ref 37–153)
BUN/Creatinine Ratio: 12 (calc) (ref 6–22)
BUN: 15 mg/dL (ref 7–25)
CO2: 27 mmol/L (ref 20–32)
Calcium: 9.5 mg/dL (ref 8.6–10.4)
Chloride: 102 mmol/L (ref 98–110)
Creat: 1.23 mg/dL — ABNORMAL HIGH (ref 0.60–0.93)
GFR, Est African American: 50 mL/min/{1.73_m2} — ABNORMAL LOW (ref >=60)
GFR, Est Non African American: 43 mL/min/{1.73_m2} — ABNORMAL LOW (ref >=60)
Globulin: 2.2 g/dL (calc) (ref 1.9–3.7)
Glucose, Bld: 111 mg/dL — ABNORMAL HIGH (ref 65–99)
Potassium: 4.2 mmol/L (ref 3.5–5.3)
Sodium: 138 mmol/L (ref 135–146)
Total Bilirubin: 0.6 mg/dL (ref 0.2–1.2)
Total Protein: 6.2 g/dL (ref 6.1–8.1)

## 2020-05-17 LAB — CBC WITH DIFFERENTIAL/PLATELET
Absolute Monocytes: 338 cells/uL (ref 200–950)
Basophils Absolute: 29 cells/uL (ref 0–200)
Basophils Relative: 0.6 %
Eosinophils Absolute: 348 cells/uL (ref 15–500)
Eosinophils Relative: 7.1 %
HCT: 31.6 % — ABNORMAL LOW (ref 35.0–45.0)
Hemoglobin: 10.5 g/dL — ABNORMAL LOW (ref 11.7–15.5)
Lymphs Abs: 818 cells/uL — ABNORMAL LOW (ref 850–3900)
MCH: 31.4 pg (ref 27.0–33.0)
MCHC: 33.2 g/dL (ref 32.0–36.0)
MCV: 94.6 fL (ref 80.0–100.0)
MPV: 11.9 fL (ref 7.5–12.5)
Monocytes Relative: 6.9 %
Neutro Abs: 3366 cells/uL (ref 1500–7800)
Neutrophils Relative %: 68.7 %
Platelets: 167 10*3/uL (ref 140–400)
RBC: 3.34 10*6/uL — ABNORMAL LOW (ref 3.80–5.10)
RDW: 13.2 % (ref 11.0–15.0)
Total Lymphocyte: 16.7 %
WBC: 4.9 10*3/uL (ref 3.8–10.8)

## 2020-05-20 ENCOUNTER — Other Ambulatory Visit: Payer: Self-pay | Admitting: Pharmacist

## 2020-05-20 DIAGNOSIS — M810 Age-related osteoporosis without current pathological fracture: Secondary | ICD-10-CM

## 2020-05-20 MED ORDER — DENOSUMAB 60 MG/ML ~~LOC~~ SOSY: 60.0000 mg | PREFILLED_SYRINGE | SUBCUTANEOUS | 0 refills | Status: DC

## 2020-05-20 NOTE — Progress Notes (Signed)
CMP/CBC stable and within normal limits to proceed with Prolia injections.  Prescription sent to the pharmacy and she can schedule an appointment for administration on or after 9/3.

## 2020-05-20 NOTE — Progress Notes (Signed)
Patient is due for Prolia injection on or after 9/3.  CBC/CMP on 05/17/20 stable and within normal limits to proceed with injection.  Prescription sent to pharmacy to be delivered to clinic prior to 9/3.   Mariella Saa, PharmD, [place], CPP Clinical Specialty Pharmacist (Rheumatology and Pulmonology)  05/20/2020 8:23 AM

## 2020-05-21 ENCOUNTER — Telehealth: Payer: Self-pay

## 2020-05-21 NOTE — Telephone Encounter (Signed)
The patient was called and told that Dr. Baird Cancer got a letter that [facility] Assistance program was unable to offer assistance at this time.  The pt said that she got a letter too and that she has an appt with Loma Sousa the pharmacist to bring in additional information that is needed. The pt was told that Dr. Baird Cancer placed samples up front for pickup.

## 2020-05-23 ENCOUNTER — Other Ambulatory Visit: Payer: Self-pay

## 2020-05-23 ENCOUNTER — Ambulatory Visit (INDEPENDENT_AMBULATORY_CARE_PROVIDER_SITE_OTHER): Payer: Medicare HMO

## 2020-05-23 DIAGNOSIS — E1122 Type 2 diabetes mellitus with diabetic chronic kidney disease: Secondary | ICD-10-CM | POA: Diagnosis not present

## 2020-05-23 DIAGNOSIS — N183 Chronic kidney disease, stage 3 unspecified: Secondary | ICD-10-CM | POA: Diagnosis not present

## 2020-05-23 DIAGNOSIS — E039 Hypothyroidism, unspecified: Secondary | ICD-10-CM

## 2020-05-23 NOTE — Chronic Care Management (AMB) (Signed)
   Chronic Care Management Pharmacy  Name: Hannah [name]  MRN: [id number] DOB: 20-Jul-1946  Chief Complaint/ HPI  Hannah [name],  74 y.o. , female presents for their Follow-Up CCM visit with the clinical pharmacist to complete out of pocket expense for patient assistance application for Synthroid.  PCP : Glendale Chard, MD  Patient's original application to [facility] for Synthroid patient assistance was denied, but may be appealed by providing additional information regarding out of pocket prescription and medical expenses. [employer] faxed out of pocket expense form to the office. Pt brought her estimation of benefits statements from [facility] for the last year to office visit today. Copies were made of relevant documents and will be sent to [facility] with completed out of pocket expense form.  Patient was provided with 28 days worth of Synthroid 26mcg samples today in office. We also discussed the possibility of using the Synthroid Delivers Program if patient assistance through [facility] is denied.   We discussed that Synthroid is a narrow therapeutic index drug and that switching to generic levothyroxine may cause her thyroid levels to change and adjustments to her dose may be needed. Discussed the benefits of being on name brand Synthroid.   SDOH Interventions     Most Recent Value  SDOH Interventions  Financial Strain Interventions Other (Comment)  [Assist with patient assistance application appeal for Synthroid]     Goals Addressed            This Visit's Progress   . Synthroid Patient Assistance       CARE PLAN ENTRY (see longitudinal plan of care for additional care plan information)  Current Barriers:  . Financial Barriers: patient has [employer] and reports copay for Synthroid is cost prohibitive at this time  Pharmacist Clinical Goal(s):  [name] Over the next 30 days, patient will work with PharmD and providers to relieve medication access  concerns  Interventions: . Comprehensive medication review completed; medication list updated in electronic medical record.  Bertram Savin care team collaboration (see longitudinal plan of care) . Synthroid by [employer]: Patient meets income criteria for this medication's patient assistance program. Reviewed application process. Patient provided proof of income, out of pocket spend report, and signed application. Will collaborate with primary care provider Dr. Glendale Chard for their portion of application. Completed application faxed to [employer] patient assistance program on 05/01/20 . Patient application was denied, but may be appealed by providing additional out of pocket medical and prescription expenses. [employer] faxed out of pocket expense form to office . Patient provided out of pocket medical and prescription expenses for the past year in the form of estimation of benefits statements from [facility].  . Will send completed out of pocket expense form and relevant documentation as provided by the patient to [facility] patient assistance program.  . Samples given to patient today in the office (28 days)  Patient Self Care Activities:  . Patient provided necessary out of pocket medical and prescription expenses (estimation of benefits statements)   Please see past updates related to this goal by clicking on the "Past Updates" button in the selected goal        Follow up: 2 month phone visit  Jannette Fogo, PharmD Clinical Pharmacist [facility] [phone]

## 2020-05-27 ENCOUNTER — Other Ambulatory Visit: Payer: Self-pay

## 2020-05-27 ENCOUNTER — Encounter: Payer: Self-pay | Admitting: Nurse Practitioner

## 2020-05-27 ENCOUNTER — Ambulatory Visit (INDEPENDENT_AMBULATORY_CARE_PROVIDER_SITE_OTHER): Payer: Medicare HMO | Admitting: Nurse Practitioner

## 2020-05-27 VITALS — BP 120/70 | HR 76 | Temp 98.3°F | Ht <= 58 in | Wt 157.2 lb

## 2020-05-27 DIAGNOSIS — L80 Vitiligo: Secondary | ICD-10-CM | POA: Diagnosis not present

## 2020-05-27 DIAGNOSIS — M25561 Pain in right knee: Secondary | ICD-10-CM

## 2020-05-27 DIAGNOSIS — M25562 Pain in left knee: Secondary | ICD-10-CM

## 2020-05-27 DIAGNOSIS — G8929 Other chronic pain: Secondary | ICD-10-CM

## 2020-05-27 NOTE — Progress Notes (Signed)
This visit occurred during the SARS-CoV-2 public health emergency.  Safety protocols were in place, including screening questions prior to the visit, additional usage of staff PPE, and extensive cleaning of exam room while observing appropriate contact time as indicated for disinfecting solutions.  Subjective:     Patient ID: Hannah [name] , female    DOB: 1946-06-19 , 74 y.o.   MRN: [id number]   Chief Complaint  Patient presents with  . Referral    patient would like 2 referrals for vitligo and her knees     HPI  She has a Rheumatologist who has done xrays of her knees. She has been to an orthopedic in the past. When she is stepping off a curb she has to find the lowest point to avoid feeling a shock to her knees. She is exercising 5 days a week.    She reports having areas of having hypopigmentation and is concerned she has vitiligo. She had been using a cream to her skin changes on her nose for the last 2 months without relief.    Knee Pain  The incident occurred more than 1 week ago (has had knee problems for over 20 years). There was no injury mechanism. The pain is present in the left knee and right knee. The pain is mild. Pertinent negatives include no inability to bear weight or numbness. The symptoms are aggravated by movement (unable to stand for long periods and difficulty with going up and down stairs. ). Treatments tried: over the counter pain cream.     Past Medical History:  Diagnosis Date  . Anemia   . Arthritis   . Asthma   . Diabetes mellitus without complication ([place])   . Gout 12/17/2014   patient reported  . Hyperlipidemia   . Hypertension   . Systemic lupus erythematosus (HCC)      Family History  Problem Relation Age of Onset  . Heart disease Father   . Diabetes Father   . Hypertension Mother   . Hypertension Sister   . Hypertension Sister   . Leukemia Sister      Current Outpatient Medications:  .  albuterol (PROVENTIL HFA;VENTOLIN HFA) 108  (90 Base) MCG/ACT inhaler, Inhale 2 puffs into the lungs every 6 (six) hours as needed for wheezing or shortness of breath., Disp: 1 Inhaler, Rfl: 2 .  Alcohol Swabs (ALCOHOL PADS) 70 % PADS, Use as directed to check blood sugars 1 time per day dx: e11.22, Disp: 150 each, Rfl: 2 .  allopurinol (ZYLOPRIM) 100 MG tablet, TAKE 1 TABLET BY MOUTH EVERY DAY FOR GOUT, Disp: 90 tablet, Rfl: 1 .  amLODipine (NORVASC) 2.5 MG tablet, Take 1 tablet (2.5 mg total) by mouth daily., Disp: 30 tablet, Rfl: 11 .  amLODipine (NORVASC) 5 MG tablet, Take 1 tablet (5 mg total) by mouth daily. 1/2 tab in the am and 1 tab in the pm, Disp: 180 tablet, Rfl: 1 .  aspirin EC 81 MG tablet, Take 81 mg by mouth daily., Disp: , Rfl:  .  atorvastatin (LIPITOR) 80 MG tablet, TAKE 1 TABLET BY MOUTH EVERY DAY, Disp: 90 tablet, Rfl: 1 .  Blood Glucose Calibration (ACCU-CHEK AVIVA) SOLN, , Disp: , Rfl:  .  Blood Glucose Monitoring Suppl (ACCU-CHEK AVIVA PLUS) w/Device KIT, Use as directed to check blood sugars 1 time per day dx: e11.22, Disp: 1 kit, Rfl: 1 .  Calcium Carb-Cholecalciferol (CALCIUM 600+D) 600-800 MG-UNIT TABS, Take 1 tablet by mouth daily. , Disp: , Rfl:  .  Cholecalciferol (VITAMIN D PO), Take 2,000 Units by mouth daily. , Disp: , Rfl:  .  denosumab (PROLIA) 60 MG/ML SOSY injection, Inject 60 mg into the skin every 6 (six) months., Disp: 1 mL, Rfl: 0 .  hydroxychloroquine (PLAQUENIL) 200 MG tablet, TAKE 1 TABLET BY MOUTH DAILY FOR RHEUMATOID ARTHRITIS, Disp: 90 tablet, Rfl: 0 .  Hypromellose (ARTIFICIAL TEARS OP), Apply to eye as needed., Disp: , Rfl:  .  irbesartan (AVAPRO) 300 MG tablet, TAKE 1 TABLET(300 MG) BY MOUTH DAILY, Disp: 90 tablet, Rfl: 2 .  metFORMIN (GLUCOPHAGE) 500 MG tablet, Take 1 tablet (500 mg total) by mouth daily with breakfast., Disp: 90 tablet, Rfl: 0 .  SYNTHROID 88 MCG tablet, TAKE 1 TABLET BY MOUTH EVERY DAY MONDAY TO SATURDAY AND OFF ON SUNDAYS, Disp: 90 tablet, Rfl: 0 .  tacrolimus (PROTOPIC)  0.1 % ointment, Apply topically daily., Disp: 30 g, Rfl: 6 .  budesonide-formoterol (SYMBICORT) 160-4.5 MCG/ACT inhaler, INHALE 2 PUFFS BY MOUTH TWICE DAILY IN THE MORNING AND EVENING (Patient not taking: Reported on 05/27/2020), Disp: 10.2 g, Rfl: 2   Allergies  Allergen Reactions  . Shellfish Allergy Anaphylaxis and Hives     Review of Systems  Constitutional: Negative.   Respiratory: Negative.   Cardiovascular: Negative.   Musculoskeletal: Positive for arthralgias (bilateral knee pain).  Neurological: Negative for dizziness and numbness.  Psychiatric/Behavioral: Negative.      Today's Vitals   05/27/20 1100  BP: 120/70  Pulse: 76  Temp: 98.3 F (36.8 C)  Weight: 157 lb 3.2 oz (71.3 kg)  Height: 4' 9.4" (1.458 m)  PainSc: 5   PainLoc: Knee   Body mass index is 33.55 kg/m.   Objective:  Physical Exam Vitals reviewed.  Constitutional:      Appearance: Normal appearance.  Musculoskeletal:        General: No swelling or tenderness. Normal range of motion.     Comments: Crepitus noted to bilateral knees  Skin:    Comments: Hypopigmented areas to right forehead, right shoulder, and bilateral knees.  Neurological:     General: No focal deficit present.     Mental Status: She is alert and oriented to person, place, and time.     Cranial Nerves: No cranial nerve deficit.  Psychiatric:        Mood and Affect: Mood normal.        Behavior: Behavior normal.        Thought Content: Thought content normal.        Judgment: Judgment normal.         Assessment And Plan:     1. Vitiligo  Several hypopigmented areas to right shoulder, right forehead, bilateral knees  She would like to see Dr. Martin Majestic for another opinion - Ambulatory referral to Dermatology  2. Chronic pain of both knees  Bilateral knees with noted crepitus, negative drawer test  She would like to see Dr. Synthia Innocent - Ambulatory referral to Orthopedic Surgery    Patient was given opportunity to ask  questions. Patient verbalized understanding of the plan and was able to repeat key elements of the plan. All questions were answered to their satisfaction.   Teola Bradley, FNP, have reviewed all documentation for this visit. The documentation on 05/27/20 for the exam, diagnosis, procedures, and orders are all accurate and complete.   THE PATIENT IS ENCOURAGED TO PRACTICE SOCIAL DISTANCING DUE TO THE COVID-19 PANDEMIC.

## 2020-05-27 NOTE — Telephone Encounter (Signed)
Applied for [employer] for [employer]oporosis, program is requesting income verification documents. Patient will drop off at MD office to have sent to program. Will upload through portal for faster processing.

## 2020-05-28 NOTE — Patient Instructions (Addendum)
Visit Information  Goals Addressed            This Visit's Progress   . Synthroid Patient Assistance       CARE PLAN ENTRY (see longitudinal plan of care for additional care plan information)  Current Barriers:  . Financial Barriers: patient has [employer] and reports copay for Synthroid is cost prohibitive at this time  Pharmacist Clinical Goal(s):  [name] Over the next 30 days, patient will work with PharmD and providers to relieve medication access concerns  Interventions: . Comprehensive medication review completed; medication list updated in electronic medical record.  Bertram Savin care team collaboration (see longitudinal plan of care) . Synthroid by [employer]: Patient meets income criteria for this medication's patient assistance program. Reviewed application process. Patient provided proof of income, out of pocket spend report, and signed application. Will collaborate with primary care provider Dr. Glendale Chard for their portion of application. Completed application faxed to [employer] patient assistance program on 05/01/20 . Patient application was denied, but may be appealed by providing additional out of pocket medical and prescription expenses. [employer] faxed out of pocket expense form to office . Patient provided out of pocket medical and prescription expenses for the past year in the form of estimation of benefits statements from [facility].  . Will send completed out of pocket expense form and relevant documentation as provided by the patient to [facility] patient assistance program.  . Samples given to patient today in the office (28 days)  Patient Self Care Activities:  . Patient provided necessary out of pocket medical and prescription expenses (estimation of benefits statements)   Please see past updates related to this goal by clicking on the "Past Updates" button in the selected goal         The patient verbalized understanding of instructions provided today and  agreed to receive a mailed copy of patient instruction and/or educational materials.  Telephone follow up appointment with pharmacy team member scheduled for: 08/01/20 @ 3:30 PM  Jannette Fogo, PharmD Clinical Pharmacist [facility] [phone]   Hypothyroidism  Hypothyroidism is when the thyroid gland does not make enough of certain hormones (it is underactive). The thyroid gland is a small gland located in the lower front part of the neck, just in front of the windpipe (trachea). This gland makes hormones that help control how the body uses food for energy (metabolism) as well as how the heart and brain function. These hormones also play a role in keeping your bones strong. When the thyroid is underactive, it produces too little of the hormones thyroxine (T4) and triiodothyronine (T3). What are the causes? This condition may be caused by:  Hashimoto's disease. This is a disease in which the body's disease-fighting system (immune system) attacks the thyroid gland. This is the most common cause.  Viral infections.  Pregnancy.  Certain medicines.  Birth defects.  Past radiation treatments to the head or neck for cancer.  Past treatment with radioactive iodine.  Past exposure to radiation in the environment.  Past surgical removal of part or all of the thyroid.  Problems with a gland in the center of the brain (pituitary gland).  Lack of enough iodine in the diet. What increases the risk? You are more likely to develop this condition if:  You are female.  You have a family history of thyroid conditions.  You use a medicine called lithium.  You take medicines that affect the immune system (immunosuppressants). What are the signs or symptoms?  Symptoms of this condition include:  Feeling as though you have no energy (lethargy).  Not being able to tolerate cold.  Weight gain that is not explained by a change in diet or exercise habits.  Lack  of appetite.  Dry skin.  Coarse hair.  Menstrual irregularity.  Slowing of thought processes.  Constipation.  Sadness or depression. How is this diagnosed? This condition may be diagnosed based on:  Your symptoms, your medical history, and a physical exam.  Blood tests. You may also have imaging tests, such as an ultrasound or MRI. How is this treated? This condition is treated with medicine that replaces the thyroid hormones that your body does not make. After you begin treatment, it may take several weeks for symptoms to go away. Follow these instructions at home:  Take over-the-counter and prescription medicines only as told by your health care provider.  If you start taking any new medicines, tell your health care provider.  Keep all follow-up visits as told by your health care provider. This is important. ? As your condition improves, your dosage of thyroid hormone medicine may change. ? You will need to have blood tests regularly so that your health care provider can monitor your condition. Contact a health care provider if:  Your symptoms do not get better with treatment.  You are taking thyroid replacement medicine and you: ? Sweat a lot. ? Have tremors. ? Feel anxious. ? Lose weight rapidly. ? Cannot tolerate heat. ? Have emotional swings. ? Have diarrhea. ? Feel weak. Get help right away if you have:  Chest pain.  An irregular heartbeat.  A rapid heartbeat.  Difficulty breathing. Summary  Hypothyroidism is when the thyroid gland does not make enough of certain hormones (it is underactive).  When the thyroid is underactive, it produces too little of the hormones thyroxine (T4) and triiodothyronine (T3).  The most common cause is Hashimoto's disease, a disease in which the body's disease-fighting system (immune system) attacks the thyroid gland. The condition can also be caused by viral infections, medicine, pregnancy, or past radiation treatment to  the head or neck.  Symptoms may include weight gain, dry skin, constipation, feeling as though you do not have energy, and not being able to tolerate cold.  This condition is treated with medicine to replace the thyroid hormones that your body does not make. This information is not intended to replace advice given to you by your health care provider. Make sure you discuss any questions you have with your health care provider. Document Revised: 08/27/2017 Document Reviewed: 08/25/2017 Elsevier Patient Education  2020 [employer].

## 2020-05-30 ENCOUNTER — Telehealth: Payer: Self-pay

## 2020-05-30 NOTE — Chronic Care Management (AMB) (Addendum)
Chronic Care Management Pharmacy Assistant   Name: DAJE STARK  MRN: [id number] DOB: 03/15/1946  Reason for Encounter: Patient Assistance Coordination  PCP : Glendale Chard, MD  Allergies:   Allergies  Allergen Reactions   Shellfish Allergy Anaphylaxis and Hives    Medications: Outpatient Encounter Medications as of 05/30/2020  Medication Sig   albuterol (PROVENTIL HFA;VENTOLIN HFA) 108 (90 Base) MCG/ACT inhaler Inhale 2 puffs into the lungs every 6 (six) hours as needed for wheezing or shortness of breath.   Alcohol Swabs (ALCOHOL PADS) 70 % PADS Use as directed to check blood sugars 1 time per day dx: e11.22   allopurinol (ZYLOPRIM) 100 MG tablet TAKE 1 TABLET BY MOUTH EVERY DAY FOR GOUT   amLODipine (NORVASC) 2.5 MG tablet Take 1 tablet (2.5 mg total) by mouth daily.   amLODipine (NORVASC) 5 MG tablet Take 1 tablet (5 mg total) by mouth daily. 1/2 tab in the am and 1 tab in the pm   aspirin EC 81 MG tablet Take 81 mg by mouth daily.   atorvastatin (LIPITOR) 80 MG tablet TAKE 1 TABLET BY MOUTH EVERY DAY   Blood Glucose Calibration (ACCU-CHEK AVIVA) SOLN    Blood Glucose Monitoring Suppl (ACCU-CHEK AVIVA PLUS) w/Device KIT Use as directed to check blood sugars 1 time per day dx: e11.22   budesonide-formoterol (SYMBICORT) 160-4.5 MCG/ACT inhaler INHALE 2 PUFFS BY MOUTH TWICE DAILY IN THE MORNING AND EVENING (Patient not taking: Reported on 05/27/2020)   Calcium Carb-Cholecalciferol (CALCIUM 600+D) 600-800 MG-UNIT TABS Take 1 tablet by mouth daily.    Cholecalciferol (VITAMIN D PO) Take 2,000 Units by mouth daily.    denosumab (PROLIA) 60 MG/ML SOSY injection Inject 60 mg into the skin every 6 (six) months.   hydroxychloroquine (PLAQUENIL) 200 MG tablet TAKE 1 TABLET BY MOUTH DAILY FOR RHEUMATOID ARTHRITIS   Hypromellose (ARTIFICIAL TEARS OP) Apply to eye as needed.   irbesartan (AVAPRO) 300 MG tablet TAKE 1 TABLET(300 MG) BY MOUTH DAILY   metFORMIN (GLUCOPHAGE) 500 MG  tablet Take 1 tablet (500 mg total) by mouth daily with breakfast.   SYNTHROID 88 MCG tablet TAKE 1 TABLET BY MOUTH EVERY DAY MONDAY TO SATURDAY AND OFF ON SUNDAYS   tacrolimus (PROTOPIC) 0.1 % ointment Apply topically daily.   No facility-administered encounter medications on file as of 05/30/2020.    Current Diagnosis: Patient Active Problem List   Diagnosis Date Noted   PVC (premature ventricular contraction) 05/06/2020   Type 2 diabetes mellitus with stage 3 chronic kidney disease, without long-term current use of insulin ([place]) 03/20/2020   Palpitations 03/20/2020   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/2020   Primary hypothyroidism 03/20/2020   Vitiligo 03/20/2020   Iron deficiency anemia 08/12/2017   Osteoporosis 10/02/2016   Systemic lupus erythematosus ([place]) 10/02/2016   Rheumatoid arthritis involving multiple sites with positive rheumatoid factor ([place]) 10/02/2016   High risk medication use 10/02/2016   History of chronic kidney disease 10/02/2016   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/2016   Primary osteoarthritis of both knees 10/02/2016   Vitamin D deficiency 10/02/2016   History of diabetes mellitus 10/02/2016   History of hypertension 10/02/2016   History of asthma 10/02/2016   Absolute anemia    MGUS (monoclonal gammopathy of unknown significance)    Normocytic anemia 03/21/2015   Essential hypertension 03/20/2015   Abnormal CT of the chest 07/30/2012   Asthma 06/19/2012     Follow-Up:  Patient Assistance Coordination- Called [employer] patient assistance program,  spoke with [name], inquired on what information will be accepted for the prescription and medical expense form for Synthroid. [name] states we will need the name of the prescriptions and out of pocket costs along with any other medical expense cost. Informed we have patient's [employer] coverage details and what she has and will pay for prescriptions and medical expenses. Filling out form and  faxing to [facility] patient assistance program. Jannette Fogo, CPP notified.   06/25/2020- Judithann Sheen, CPA called [employer] to follow up on patient's application and medication status on Synthroid. Verdene Lennert spoke with Kevan Ny, he stated patient's medication shows that it was delivered to Dr. Baird Cancer' office on 06/21/2020. Dr Baird Cancer' office was notified but not able to locate medication at office. Verdene Lennert was able to get a new order approved to be sent out today, will be at providers office within 7-10 business days Order [phone] . Patient was called to give information, Judithann Sheen left message to return call.  Synthroid medication was located in the office, later in the day. Judithann Sheen, CPA called [employer] assist back to cancel new order. Patient returned call and is aware that medication is in the office, she will pick up Synthroid on 06/26/20.  Jannette Fogo, CPP aware.  Pattricia Boss, [place] Pharmacist Assistant [phone]

## 2020-05-31 ENCOUNTER — Telehealth: Payer: Self-pay | Admitting: Rheumatology

## 2020-05-31 NOTE — Telephone Encounter (Signed)
Patient called stating she was returning Rachael's call regarding dropping paperwork off to the office today.  Patient is requesting a return call ASAP.

## 2020-05-31 NOTE — Telephone Encounter (Signed)
Called patient to follow up on income docs, left message.

## 2020-05-31 NOTE — Telephone Encounter (Signed)
Return patient's call and discussed her paperwork. She got confused after she was called to change her appointment, and thought she didn't need to bring her paperwork any longer. Grant closed while we were awaiting income paperwork. Patient will pay out of pocket for injection. She will save receipt for possible reimbursement if a grant comes open soon after.  Additional updates will be in previous encounter.

## 2020-06-04 ENCOUNTER — Ambulatory Visit: Payer: Medicare HMO

## 2020-06-04 ENCOUNTER — Telehealth: Payer: Self-pay

## 2020-06-05 MED FILL — PROLIA 60 MG/ML SOLN: 60 | 180 days supply | Qty: 1 | Fill #0

## 2020-06-06 ENCOUNTER — Telehealth: Payer: Self-pay | Admitting: *Deleted

## 2020-06-06 ENCOUNTER — Telehealth: Payer: Self-pay

## 2020-06-06 DIAGNOSIS — M25562 Pain in left knee: Secondary | ICD-10-CM | POA: Insufficient documentation

## 2020-06-06 DIAGNOSIS — M25561 Pain in right knee: Secondary | ICD-10-CM | POA: Insufficient documentation

## 2020-06-06 NOTE — Telephone Encounter (Signed)
Received Prolia and placed in fridge. Patient has an appointment on 06/10/2020

## 2020-06-10 ENCOUNTER — Ambulatory Visit (INDEPENDENT_AMBULATORY_CARE_PROVIDER_SITE_OTHER): Payer: Medicare HMO | Admitting: Pharmacist

## 2020-06-10 ENCOUNTER — Other Ambulatory Visit: Payer: Self-pay

## 2020-06-10 DIAGNOSIS — M25561 Pain in right knee: Secondary | ICD-10-CM | POA: Diagnosis not present

## 2020-06-10 DIAGNOSIS — M1712 Unilateral primary osteoarthritis, left knee: Secondary | ICD-10-CM | POA: Diagnosis not present

## 2020-06-10 DIAGNOSIS — M810 Age-related osteoporosis without current pathological fracture: Secondary | ICD-10-CM

## 2020-06-10 DIAGNOSIS — M1711 Unilateral primary osteoarthritis, right knee: Secondary | ICD-10-CM | POA: Diagnosis not present

## 2020-06-10 DIAGNOSIS — M170 Bilateral primary osteoarthritis of knee: Secondary | ICD-10-CM | POA: Diagnosis not present

## 2020-06-10 DIAGNOSIS — M25562 Pain in left knee: Secondary | ICD-10-CM | POA: Diagnosis not present

## 2020-06-10 MED ORDER — DENOSUMAB 60 MG/ML ~~LOC~~ SOSY
60.0000 mg | PREFILLED_SYRINGE | Freq: Once | SUBCUTANEOUS | Status: AC
  Administered 2020-06-10: 60 mg via SUBCUTANEOUS
  Filled 2020-06-10: qty 1

## 2020-06-10 NOTE — Progress Notes (Signed)
Pharmacy Note  Subjective:   Patient presents to clinic today to receive bi-annual dose of Prolia.  Patient running a fever or have signs/symptoms of infection? No  Patient currently on antibiotics for the treatment of infection? No  Patient had fall in the last 6 months?  No  If yes, did it require medical attention? No   Patient taking calcium 1200 mg daily through diet or supplement and at least 800 units vitamin D? Yes.  She is taking vitamin D 2000 units daily. Calcium carbonate plus vitamin D 600-800 mg/units 1 tablet daily. She eats 1 serving of dairy daily.  Objective: CMP     Component Value Date/Time   NA 138 05/17/2020 1404   NA 141 10/31/2019 1635   NA 142 08/12/2017 0808   K 4.2 05/17/2020 1404   K 3.7 08/12/2017 0808   CL 102 05/17/2020 1404   CO2 27 05/17/2020 1404   CO2 27 08/12/2017 0808   GLUCOSE 111 (H) 05/17/2020 1404   GLUCOSE 85 08/12/2017 0808   BUN 15 05/17/2020 1404   BUN 15 10/31/2019 1635   BUN 20.8 08/12/2017 0808   CREATININE 1.23 (H) 05/17/2020 1404   CREATININE 1.4 (H) 08/12/2017 0808   CALCIUM 9.5 05/17/2020 1404   CALCIUM 10.3 08/12/2017 0808   PROT 6.2 05/17/2020 1404   PROT 6.7 10/31/2019 1635   PROT 6.8 08/12/2017 0808   ALBUMIN 3.9 12/26/2019 1102   ALBUMIN 4.5 10/31/2019 1635   ALBUMIN 3.7 08/12/2017 0808   AST 19 05/17/2020 1404   AST 19 12/26/2019 1102   AST 31 08/12/2017 0808   ALT 16 05/17/2020 1404   ALT 13 12/26/2019 1102   ALT 38 08/12/2017 0808   ALKPHOS 48 12/26/2019 1102   ALKPHOS 51 08/12/2017 0808   BILITOT 0.6 05/17/2020 1404   BILITOT 0.7 12/26/2019 1102   BILITOT 0.45 08/12/2017 0808   GFRNONAA 43 (L) 05/17/2020 1404   GFRAA 50 (L) 05/17/2020 1404    CBC    Component Value Date/Time   WBC 4.9 05/17/2020 1404   RBC 3.34 (L) 05/17/2020 1404   HGB 10.5 (L) 05/17/2020 1404   HGB 11.7 10/31/2019 1635   HGB 9.9 (L) 08/12/2017 0808   HCT 31.6 (L) 05/17/2020 1404   HCT 35.5 10/31/2019 1635   HCT 30.7 (L)  08/12/2017 0808   PLT 167 05/17/2020 1404   PLT 154 10/31/2019 1635   MCV 94.6 05/17/2020 1404   MCV 98 (H) 10/31/2019 1635   MCV 97.5 08/12/2017 0808   MCH 31.4 05/17/2020 1404   MCHC 33.2 05/17/2020 1404   RDW 13.2 05/17/2020 1404   RDW 12.1 10/31/2019 1635   RDW 14.0 08/12/2017 0808   LYMPHSABS 818 (L) 05/17/2020 1404   LYMPHSABS 0.9 08/12/2017 0808   MONOABS 0.4 12/26/2019 1102   MONOABS 0.3 08/12/2017 0808   EOSABS 348 05/17/2020 1404   EOSABS 0.4 08/12/2017 0808   BASOSABS 29 05/17/2020 1404   BASOSABS 0.0 08/12/2017 0808    Lab Results  Component Value Date   VD25OH 41 11/14/2019    Assessment/Plan:   Administrations This Visit    denosumab (PROLIA) injection 60 mg    Admin Date 06/10/2020 Action Given Dose 60 mg Route Subcutaneous Administered By Mariella Saa C, RPH-CPP         Patient tolerated injection without issue.   Patient is to return in 10-14 days for labs to monitor for hypocalcemia.  Future orders placed.   All questions encouraged and answered.  Instructed patient to call with any further questions or concerns.  Mariella Saa, PharmD, [place], CPP Clinical Specialty Pharmacist (Rheumatology and Pulmonology)  06/10/2020 9:51 AM

## 2020-06-10 NOTE — Patient Instructions (Signed)
Standing Labs We placed an order today for your standing lab work.   Please have your standing labs drawn in 10-14 days.  If possible, please have your labs drawn 2 weeks prior to your appointment so that the provider can discuss your results at your appointment.  We have open lab daily Monday through Thursday from 8:30-12:30 PM and 1:30-4:30 PM and Friday from 8:30-12:30 PM and 1:30-4:00 PM at the office of Dr. Bo Merino, [facility].   Please be advised, patients with office appointments requiring lab work will take precedents over walk-in lab work.  If possible, please come for your lab work on Monday and Friday afternoons, as you may experience shorter wait times. The office is located at [address], [place], [place], [place] [address] No appointment is necessary.   Labs are drawn by [facility]. Please bring your co-pay at the time of your lab draw.  You may receive a bill from [facility] for your lab work.  If you wish to have your labs drawn at another location, please call the office 24 hours in advance to send orders.  If you have any questions regarding directions or hours of operation,  please call [phone].   As a reminder, please drink plenty of water prior to coming for your lab work. Thanks!

## 2020-06-12 ENCOUNTER — Telehealth: Payer: Self-pay

## 2020-06-12 DIAGNOSIS — M9903 Segmental and somatic dysfunction of lumbar region: Secondary | ICD-10-CM | POA: Diagnosis not present

## 2020-06-12 DIAGNOSIS — M5137 Other intervertebral disc degeneration, lumbosacral region: Secondary | ICD-10-CM | POA: Diagnosis not present

## 2020-06-12 DIAGNOSIS — M9902 Segmental and somatic dysfunction of thoracic region: Secondary | ICD-10-CM | POA: Diagnosis not present

## 2020-06-12 DIAGNOSIS — M47814 Spondylosis without myelopathy or radiculopathy, thoracic region: Secondary | ICD-10-CM | POA: Diagnosis not present

## 2020-06-12 DIAGNOSIS — M5136 Other intervertebral disc degeneration, lumbar region: Secondary | ICD-10-CM | POA: Diagnosis not present

## 2020-06-12 DIAGNOSIS — M9905 Segmental and somatic dysfunction of pelvic region: Secondary | ICD-10-CM | POA: Diagnosis not present

## 2020-06-12 NOTE — Telephone Encounter (Signed)
The patient was notified that [facility] Assist approved assistance for the pt's synthroid 88 mcg until September 27, 2020.

## 2020-06-17 ENCOUNTER — Telehealth: Payer: Self-pay

## 2020-06-17 NOTE — Chronic Care Management (AMB) (Signed)
Chronic Care Management Pharmacy Assistant   Name: Hannah [name]  MRN: [id number] DOB: 02-25-1946  Reason for Encounter: Disease State - Diabetes   Patient Questions:  1.  Have you seen any other providers since your last visit?  Yes, 05/27/2020 Minette Brine, FNP,  PCP -( referral to Dermatology Dr. Trish Fountain referral to (Orthopedic Surgery , Dr. Synthia Innocent).    06/10/2020 Amber Yopp, Pharm D, BCACP, CPP , [place].                2.  Any changes in your medicines or health? No    PCP : Glendale Chard, MD  Allergies:   Allergies  Allergen Reactions  . Shellfish Allergy Anaphylaxis and Hives    Medications: Outpatient Encounter Medications as of 06/17/2020  Medication Sig  . albuterol (PROVENTIL HFA;VENTOLIN HFA) 108 (90 Base) MCG/ACT inhaler Inhale 2 puffs into the lungs every 6 (six) hours as needed for wheezing or shortness of breath.  . Alcohol Swabs (ALCOHOL PADS) 70 % PADS Use as directed to check blood sugars 1 time per day dx: e11.22  . allopurinol (ZYLOPRIM) 100 MG tablet TAKE 1 TABLET BY MOUTH EVERY DAY FOR GOUT  . amLODipine (NORVASC) 2.5 MG tablet Take 1 tablet (2.5 mg total) by mouth daily.  [name] amLODipine (NORVASC) 5 MG tablet Take 1 tablet (5 mg total) by mouth daily. 1/2 tab in the am and 1 tab in the pm  . aspirin EC 81 MG tablet Take 81 mg by mouth daily.  [name] atorvastatin (LIPITOR) 80 MG tablet TAKE 1 TABLET BY MOUTH EVERY DAY  . Blood Glucose Calibration (ACCU-CHEK AVIVA) SOLN   . Blood Glucose Monitoring Suppl (ACCU-CHEK AVIVA PLUS) w/Device KIT Use as directed to check blood sugars 1 time per day dx: e11.22  . budesonide-formoterol (SYMBICORT) 160-4.5 MCG/ACT inhaler INHALE 2 PUFFS BY MOUTH TWICE DAILY IN THE MORNING AND EVENING (Patient not taking: Reported on 05/27/2020)  . Calcium Carb-Cholecalciferol (CALCIUM 600+D) 600-800 MG-UNIT TABS Take 1 tablet by mouth daily.   . Cholecalciferol (VITAMIN D PO) Take 2,000 Units by mouth daily.   [name] denosumab (PROLIA)  60 MG/ML SOSY injection Inject 60 mg into the skin every 6 (six) months.  . hydroxychloroquine (PLAQUENIL) 200 MG tablet TAKE 1 TABLET BY MOUTH DAILY FOR RHEUMATOID ARTHRITIS  . Hypromellose (ARTIFICIAL TEARS OP) Apply to eye as needed.  . irbesartan (AVAPRO) 300 MG tablet TAKE 1 TABLET(300 MG) BY MOUTH DAILY  . metFORMIN (GLUCOPHAGE) 500 MG tablet Take 1 tablet (500 mg total) by mouth daily with breakfast.  . SYNTHROID 88 MCG tablet TAKE 1 TABLET BY MOUTH EVERY DAY MONDAY TO SATURDAY AND OFF ON SUNDAYS  . tacrolimus (PROTOPIC) 0.1 % ointment Apply topically daily.   No facility-administered encounter medications on file as of 06/17/2020.    Current Diagnosis: Patient Active Problem List   Diagnosis Date Noted  . PVC (premature ventricular contraction) 05/06/2020  . Type 2 diabetes mellitus with stage 3 chronic kidney disease, without long-term current use of insulin ([place]) 03/20/2020  . Palpitations 03/20/2020  . 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/2020  . Primary hypothyroidism 03/20/2020  . Vitiligo 03/20/2020  . Iron deficiency anemia 08/12/2017  . Osteoporosis 10/02/2016  . Systemic lupus erythematosus ([place]) 10/02/2016  . Rheumatoid arthritis involving multiple sites with positive rheumatoid factor ([place]) 10/02/2016  . High risk medication use 10/02/2016  . History of chronic kidney disease 10/02/2016  . 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/2016  . Primary osteoarthritis of  both knees 10/02/2016  . Vitamin D deficiency 10/02/2016  . History of diabetes mellitus 10/02/2016  . History of hypertension 10/02/2016  . History of asthma 10/02/2016  . Absolute anemia   . MGUS (monoclonal gammopathy of unknown significance)   . Normocytic anemia 03/21/2015  . Essential hypertension 03/20/2015  . Abnormal CT of the chest 07/30/2012  . Asthma 06/19/2012   Recent Relevant Labs: Lab Results  Component Value Date/Time   HGBA1C 5.7 (H) 03/20/2020 04:45 PM   HGBA1C  5.6 10/31/2019 04:35 PM   MICROALBUR 80 10/31/2019 12:13 PM   MICROALBUR 80 06/15/2019 06:11 PM    Kidney Function Lab Results  Component Value Date/Time   CREATININE 1.23 (H) 05/17/2020 02:04 PM   CREATININE 1.24 (H) 04/16/2020 03:16 PM   CREATININE 1.4 (H) 08/12/2017 08:08 AM   CREATININE 1.2 (H) 02/16/2017 08:29 AM   GFRNONAA 43 (L) 05/17/2020 02:04 PM   GFRAA 50 (L) 05/17/2020 02:04 PM    . Current antihyperglycemic regimen:  o Metformin 500 mg one a day . What recent interventions/DTPs have been made to improve glycemic control:  o Patient is taking her metformin, eating healthy, and exercising o Patient is also drinking 64 ounces of water a day. . Have there been any recent hospitalizations or ED visits since last visit with CPP? No   . Patient denies hypoglycemic symptoms, including Pale, Sweaty, Shaky, Hungry, Nervous/irritable and Vision changes . Patient denies hyperglycemic symptoms, including blurry vision, excessive thirst, fatigue, polyuria and weakness . How often are you checking your blood sugar? once daily  . What are your blood sugars ranging?  o Fasting: 06/08/2020 - 90 o 06/09/2020 - 88 o 06/11/2020 - 87 o 06/16/2020 - 88 o Before meals: none o After meals: none o Bedtime: none . During the week, how often does your blood glucose drop below 70? Never   . Are you checking your feet daily/regularly? Yes. Patient has been checking her feet, states that she has a bunion on her second toe, on both feet. Patient denies swelling or pain or sores.  Adherence Review: Is the patient currently on a STATIN medication? Yes. Atorvastatin 80 mg one tablet a day. Is the patient currently on ACE/ARB medication? Yes. Irbesartan 300 mg one a day. Does the patient have >5 day gap between last estimated fill dates? No    Goals Addressed            This Visit's Progress   . Pharmacy Care Plan   On track    CARE PLAN ENTRY (see longitudinal plan of care for additional  care plan information)  Current Barriers:  . Chronic Disease Management support, education, and care coordination needs related to Hypertension, Hyperlipidemia, Diabetes, Asthma, Hypothyroidism, and Osteoporosis   Hypertension BP Readings from Last 3 Encounters:  03/20/20 (!) 160/78  12/26/19 (!) 155/64  11/29/19 (!) 144/73   . Pharmacist Clinical Goal(s): o Over the next 90 days, patient will work with PharmD and providers to achieve BP goal <130/80 . Current regimen:  o Amlodipine 2.81m every morning and 570mevery evening o Irbesartan 30051maily . Interventions: o Patient has been able to receive amlodipine 2.5mg12md 5mg 42mlets and is appreciative she does not have to cut 5mg t28mets in half anymore o Provided dietary and exercise recommendations . Patient self care activities - Over the next 14 days, patient will: o Check BP daily, document, and provide at future appointments o Ensure daily salt intake < 2300 mg/day o Exercise  for at least 30 minutes per day 5 days per week (150 minutes per week)  Hyperlipidemia Lab Results  Component Value Date/Time   [facility] 53 10/31/2019 04:35 PM   . Pharmacist Clinical Goal(s): o Over the next 90 days, patient will work with PharmD and providers to maintain LDL goal < 70 . Current regimen:  o Atorvastatin 80 mg daily . Interventions: o Provided dietary and exercise recommendations . Patient self care activities - Over the next 90 days, patient will: o Take cholesterol medication daily as directed o Exercise for at least 30 minutes per day 5 days per week (150 minutes per week)  Diabetes Lab Results  Component Value Date/Time   HGBA1C 5.7 (H) 03/20/2020 04:45 PM   HGBA1C 5.6 10/31/2019 04:35 PM   . Pharmacist Clinical Goal(s): o Over the next 90 days, patient will work with PharmD and providers to maintain A1c goal <7% . Current regimen:  o Metformin 526m daily . Interventions: o Provided dietary and exercise  recommendations . Patient self care activities - Over the next 90 days, patient will: o Check blood sugar several times per week, document, and provide at future appointments o Contact provider with any episodes of hypoglycemia o Exercise for at least 30 minutes per day 5 days per week (150 minutes per week)  Hypothyroidism . Pharmacist Clinical Goal(s) o Over the next 90 days, patient will work with PharmD and providers to obtain Synthroid medication . Current regimen:  o Synthroid 846m daily Monday through Saturday, skip Sundays . Interventions: o Discussed patient assistance program for Synthroid through [employer] of required documents for application (proof of income and medication out of pocket spending report from pharmacy) . Patient self care activities - Over the next 90 days, patient will: o Continue taking Synthroid daily as directed o Bring required documents to next office visit to complete patient assistance application  Medication management . Pharmacist Clinical Goal(s): o Over the next 90 days, patient will work with PharmD and providers to achieve optimal medication adherence . Current pharmacy: [facility] . Interventions o Comprehensive medication review performed. o Continue current medication management strategy . Patient self care activities - Over the next 90 days, patient will: o Focus on medication adherence by considering use of pill box to aide in organization of medication o Take medications as prescribed o Report any questions or concerns to PharmD and/or provider(s)  Please see past updates related to this goal by clicking on the "Past Updates" button in the selected goal         Follow-Up:  Pharmacist Review - Patient is meeting goals with Diabetes Adherence. Patient has no concerns or questions for pharmacist.   CoJannette FogoCPP notified.  VeJudithann SheenCCAuburn Regional Medical Centerlinical Pharmacist Assistant [phone]

## 2020-06-18 ENCOUNTER — Telehealth: Payer: Self-pay | Admitting: *Deleted

## 2020-06-18 NOTE — Telephone Encounter (Addendum)
Patient has lab appt here tomorrow: 6 month visit for Normocytic anemia and MGUS is scheduled 9/30. She wants to know what labs will be ordered for her as she has appt with PCP on Thursday and would like to reduce duplication of tests. Dr. Irene Limbo informed. Per Dr. Irene Limbo: CBC/diff; CMP; Ferritin; Iron profile Contacted patient - gave her test names. She will share with Dr. Baird Cancer tomorrow. She will call tomorrow after appt to notify this office If tests are completed at Dr. Baird Cancer office. Patient called - requested lab appt on 9/23 cancelled. All labs completed at Dr. Baird Cancer office

## 2020-06-19 ENCOUNTER — Ambulatory Visit (INDEPENDENT_AMBULATORY_CARE_PROVIDER_SITE_OTHER): Payer: Medicare HMO | Admitting: Internal Medicine

## 2020-06-19 ENCOUNTER — Encounter: Payer: Self-pay | Admitting: Internal Medicine

## 2020-06-19 ENCOUNTER — Ambulatory Visit (INDEPENDENT_AMBULATORY_CARE_PROVIDER_SITE_OTHER): Payer: Medicare HMO

## 2020-06-19 ENCOUNTER — Other Ambulatory Visit: Payer: Self-pay

## 2020-06-19 VITALS — BP 140/72 | HR 54 | Temp 98.1°F | Ht <= 58 in | Wt 154.2 lb

## 2020-06-19 VITALS — BP 140/72 | HR 54 | Temp 98.1°F | Ht <= 58 in | Wt 154.0 lb

## 2020-06-19 DIAGNOSIS — Z0000 Encounter for general adult medical examination without abnormal findings: Secondary | ICD-10-CM

## 2020-06-19 DIAGNOSIS — E2839 Other primary ovarian failure: Secondary | ICD-10-CM | POA: Diagnosis not present

## 2020-06-19 DIAGNOSIS — D508 Other iron deficiency anemias: Secondary | ICD-10-CM

## 2020-06-19 DIAGNOSIS — E6609 Other obesity due to excess calories: Secondary | ICD-10-CM

## 2020-06-19 DIAGNOSIS — E039 Hypothyroidism, unspecified: Secondary | ICD-10-CM

## 2020-06-19 DIAGNOSIS — I129 Hypertensive chronic kidney disease with stage 1 through stage 4 chronic kidney disease, or unspecified chronic kidney disease: Secondary | ICD-10-CM

## 2020-06-19 DIAGNOSIS — N183 Chronic kidney disease, stage 3 unspecified: Secondary | ICD-10-CM | POA: Diagnosis not present

## 2020-06-19 DIAGNOSIS — Z23 Encounter for immunization: Secondary | ICD-10-CM

## 2020-06-19 DIAGNOSIS — Z6832 Body mass index (BMI) 32.0-32.9, adult: Secondary | ICD-10-CM | POA: Diagnosis not present

## 2020-06-19 DIAGNOSIS — E1122 Type 2 diabetes mellitus with diabetic chronic kidney disease: Secondary | ICD-10-CM

## 2020-06-19 NOTE — Patient Instructions (Signed)
Diabetes Mellitus and Exercise Exercising regularly is important for your overall health, especially when you have diabetes (diabetes mellitus). Exercising is not only about losing weight. It has many other health benefits, such as increasing muscle strength and bone density and reducing body fat and stress. This leads to improved fitness, flexibility, and endurance, all of which result in better overall health. Exercise has additional benefits for people with diabetes, including:  Reducing appetite.  Helping to lower and control blood glucose.  Lowering blood pressure.  Helping to control amounts of fatty substances (lipids) in the blood, such as cholesterol and triglycerides.  Helping the body to respond better to insulin (improving insulin sensitivity).  Reducing how much insulin the body needs.  Decreasing the risk for heart disease by: ? Lowering cholesterol and triglyceride levels. ? Increasing the levels of good cholesterol. ? Lowering blood glucose levels. What is my activity plan? Your health care provider or certified diabetes educator can help you make a plan for the type and frequency of exercise (activity plan) that works for you. Make sure that you:  Do at least 150 minutes of moderate-intensity or vigorous-intensity exercise each week. This could be brisk walking, biking, or water aerobics. ? Do stretching and strength exercises, such as yoga or weightlifting, at least 2 times a week. ? Spread out your activity over at least 3 days of the week.  Get some form of physical activity every day. ? Do not go more than 2 days in a row without some kind of physical activity. ? Avoid being inactive for more than 30 minutes at a time. Take frequent breaks to walk or stretch.  Choose a type of exercise or activity that you enjoy, and set realistic goals.  Start slowly, and gradually increase the intensity of your exercise over time. What do I need to know about managing my  diabetes?   Check your blood glucose before and after exercising. ? If your blood glucose is 240 mg/dL (13.3 mmol/L) or higher before you exercise, check your urine for ketones. If you have ketones in your urine, do not exercise until your blood glucose returns to normal. ? If your blood glucose is 100 mg/dL (5.6 mmol/L) or lower, eat a snack containing 15-20 grams of carbohydrate. Check your blood glucose 15 minutes after the snack to make sure that your level is above 100 mg/dL (5.6 mmol/L) before you start your exercise.  Know the symptoms of low blood glucose (hypoglycemia) and how to treat it. Your risk for hypoglycemia increases during and after exercise. Common symptoms of hypoglycemia can include: ? Hunger. ? Anxiety. ? Sweating and feeling clammy. ? Confusion. ? Dizziness or feeling light-headed. ? Increased heart rate or palpitations. ? Blurry vision. ? Tingling or numbness around the mouth, lips, or tongue. ? Tremors or shakes. ? Irritability.  Keep a rapid-acting carbohydrate snack available before, during, and after exercise to help prevent or treat hypoglycemia.  Avoid injecting insulin into areas of the body that are going to be exercised. For example, avoid injecting insulin into: ? The arms, when playing tennis. ? The legs, when jogging.  Keep records of your exercise habits. Doing this can help you and your health care provider adjust your diabetes management plan as needed. Write down: ? Food that you eat before and after you exercise. ? Blood glucose levels before and after you exercise. ? The type and amount of exercise you have done. ? When your insulin is expected to peak, if you use   insulin. Avoid exercising at times when your insulin is peaking.  When you start a new exercise or activity, work with your health care provider to make sure the activity is safe for you, and to adjust your insulin, medicines, or food intake as needed.  Drink plenty of water while  you exercise to prevent dehydration or heat stroke. Drink enough fluid to keep your urine clear or pale yellow. Summary  Exercising regularly is important for your overall health, especially when you have diabetes (diabetes mellitus).  Exercising has many health benefits, such as increasing muscle strength and bone density and reducing body fat and stress.  Your health care provider or certified diabetes educator can help you make a plan for the type and frequency of exercise (activity plan) that works for you.  When you start a new exercise or activity, work with your health care provider to make sure the activity is safe for you, and to adjust your insulin, medicines, or food intake as needed. This information is not intended to replace advice given to you by your health care provider. Make sure you discuss any questions you have with your health care provider. Document Revised: 04/08/2017 Document Reviewed: 02/24/2016 Elsevier Patient Education  2020 Elsevier Inc.  

## 2020-06-19 NOTE — Progress Notes (Signed)
I,Katawbba Wiggins,acting as a Education administrator for [name], MD.,have documented all relevant documentation on the behalf of [name], MD,as directed by  [name], MD while in the presence of [name], MD.  This visit occurred during the SARS-CoV-2 public health emergency.  Safety protocols were in place, including screening questions prior to the visit, additional usage of staff PPE, and extensive cleaning of exam room while observing appropriate contact time as indicated for disinfecting solutions.  Subjective:     Patient ID: Hannah [name] , female    DOB: [birth year] , 74 y.o.   MRN: [id number]   Chief Complaint  Patient presents with  . Diabetes  . Hypertension    HPI  She presents for diabetes and blood pressure check. She reports compliance with meds. States has joined [employer].   Diabetes She presents for her follow-up diabetic visit. She has type 2 diabetes mellitus. There are no hypoglycemic associated symptoms. Pertinent negatives for hypoglycemia include no headaches. Pertinent negatives for diabetes include no blurred vision and no chest pain. There are no hypoglycemic complications. Diabetic complications include nephropathy. Risk factors for coronary artery disease include diabetes mellitus, dyslipidemia, hypertension, post-menopausal and sedentary lifestyle. She is following a diabetic diet. She participates in exercise three times a week. An ACE inhibitor/angiotensin II receptor blocker is being taken.  Hypertension This is a chronic problem. The current episode started more than 1 year ago. The problem has been gradually improving since onset. The problem is controlled. Pertinent negatives include no blurred vision, chest pain or headaches.     Past Medical History:  Diagnosis Date  . Anemia   . Arthritis   . Asthma   . Diabetes mellitus without complication ([place])   . Gout 12/17/2014   patient reported  . Hyperlipidemia   .  Hypertension   . Systemic lupus erythematosus ([facility])      Family History  Problem Relation Age of Onset  . Heart disease Father   . Diabetes Father   . Hypertension Mother   . Hypertension Sister   . Hypertension Sister   . Leukemia Sister      Current Outpatient Medications:  .  albuterol (PROVENTIL HFA;VENTOLIN HFA) 108 (90 Base) MCG/ACT inhaler, Inhale 2 puffs into the lungs every 6 (six) hours as needed for wheezing or shortness of breath., Disp: 1 Inhaler, Rfl: 2 .  Alcohol Swabs (ALCOHOL PADS) 70 % PADS, Use as directed to check blood sugars 1 time per day dx: e11.22, Disp: 150 each, Rfl: 2 .  allopurinol (ZYLOPRIM) 100 MG tablet, TAKE 1 TABLET BY MOUTH EVERY DAY FOR GOUT, Disp: 90 tablet, Rfl: 1 .  amLODipine (NORVASC) 5 MG tablet, Take 1 tablet (5 mg total) by mouth daily. 1/2 tab in the am and 1 tab in the pm, Disp: 180 tablet, Rfl: 1 .  aspirin EC 81 MG tablet, Take 81 mg by mouth daily., Disp: , Rfl:  .  atorvastatin (LIPITOR) 80 MG tablet, TAKE 1 TABLET BY MOUTH EVERY DAY, Disp: 90 tablet, Rfl: 1 .  Blood Glucose Calibration (ACCU-CHEK AVIVA) SOLN, , Disp: , Rfl:  .  Blood Glucose Monitoring Suppl (ACCU-CHEK AVIVA PLUS) w/Device KIT, Use as directed to check blood sugars 1 time per day dx: e11.22, Disp: 1 kit, Rfl: 1 .  budesonide-formoterol (SYMBICORT) 160-4.5 MCG/ACT inhaler, INHALE 2 PUFFS BY MOUTH TWICE DAILY IN THE MORNING AND EVENING, Disp: 10.2 g, Rfl: 2 .  Calcium Carb-Cholecalciferol (CALCIUM 600+D) 600-800 MG-UNIT TABS,  Take 1 tablet by mouth daily. , Disp: , Rfl:  .  Cholecalciferol (VITAMIN D PO), Take 2,000 Units by mouth daily. , Disp: , Rfl:  .  denosumab (PROLIA) 60 MG/ML SOSY injection, Inject 60 mg into the skin every 6 (six) months., Disp: 1 mL, Rfl: 0 .  hydroxychloroquine (PLAQUENIL) 200 MG tablet, TAKE 1 TABLET BY MOUTH DAILY FOR RHEUMATOID ARTHRITIS, Disp: 90 tablet, Rfl: 0 .  Hypromellose (ARTIFICIAL TEARS OP), Apply to eye as needed., Disp: , Rfl:  .   irbesartan (AVAPRO) 300 MG tablet, TAKE 1 TABLET(300 MG) BY MOUTH DAILY, Disp: 90 tablet, Rfl: 2 .  metFORMIN (GLUCOPHAGE) 500 MG tablet, Take 1 tablet (500 mg total) by mouth daily with breakfast., Disp: 90 tablet, Rfl: 0 .  SYNTHROID 88 MCG tablet, TAKE 1 TABLET BY MOUTH EVERY DAY MONDAY TO SATURDAY AND OFF ON SUNDAYS, Disp: 90 tablet, Rfl: 0 .  tacrolimus (PROTOPIC) 0.1 % ointment, Apply topically daily., Disp: 30 g, Rfl: 6   Allergies  Allergen Reactions  . Shellfish Allergy Anaphylaxis and Hives     Review of Systems  Constitutional: Negative.   Eyes: Negative for blurred vision.  Respiratory: Negative.   Cardiovascular: Negative.  Negative for chest pain.  Gastrointestinal: Negative.   Neurological: Negative for headaches.  Psychiatric/Behavioral: Negative.   All other systems reviewed and are negative.    Today's Vitals   06/19/20 1053  BP: 140/72  Pulse: (!) 54  Temp: 98.1 F (36.7 C)  TempSrc: Oral  Weight: 154 lb 3.2 oz (69.9 kg)  Height: 4' 9.4" (1.458 m)  PainSc: 5   PainLoc: Knee   Body mass index is 32.91 kg/m.   Wt Readings from Last 3 Encounters:  06/19/20 154 lb (69.9 kg)  06/19/20 154 lb 3.2 oz (69.9 kg)  05/27/20 157 lb 3.2 oz (71.3 kg)     Objective:  Physical Exam Vitals and nursing note reviewed.  Constitutional:      Appearance: Normal appearance. She is obese.  HENT:     Head: Normocephalic and atraumatic.  Cardiovascular:     Rate and Rhythm: Normal rate and regular rhythm.  Pulmonary:     Breath sounds: Normal breath sounds.  Skin:    General: Skin is warm.  Neurological:     General: No focal deficit present.     Mental Status: She is alert and oriented to person, place, and time.         Assessment And Plan:     1. Type 2 diabetes mellitus with stage 3 chronic kidney disease, without long-term current use of insulin, unspecified whether stage 3a or 3b CKD ([place]) Comments: Chronic, I will check an a1c along with renal  function today. I will forward results to her hematologist, Dr. Irene Limbo.  - Hemoglobin A1c - CMP14+EGFR  2. Malignant hypertensive renal disease, stage 1 through stage 4 or unspecified chronic kidney disease Comments: Chronic, fair control. She will continue with current meds. She is encouraged to follow a low sodium diet.   3. Other iron deficiency anemia Comments: Chronic. I will check iron panel as per Hematology. I will forward a copy to Dr. Irene Limbo for his review.  - CBC with Diff - Ferritin - Iron and IBC ([id number])  4. Primary hypothyroidism Comments: Chronic, she will continue with current meds. Previous lab results were reviewed in full detail.   5. Need for vaccination Comments: She was given high dose flu vaccine.  - Flu Vaccine QUAD High Dose(Fluad)  6.  Class 1 obesity due to excess calories with serious comorbidity and body mass index (BMI) of 32.0 to 32.9 in adult Comments: She was congratulated on her weight loss thus far and she is encouraged to keep up the great work!  Advised to aim for at least 150 minutes of exercise per week.    Patient was given opportunity to ask questions. Patient verbalized understanding of the plan and was able to repeat key elements of the plan. All questions were answered to their satisfaction.  [name], MD   I, [name], MD, have reviewed all documentation for this visit. The documentation on 06/19/20 for the exam, diagnosis, procedures, and orders are all accurate and complete.  THE PATIENT IS ENCOURAGED TO PRACTICE SOCIAL DISTANCING DUE TO THE COVID-19 PANDEMIC.

## 2020-06-19 NOTE — Patient Instructions (Signed)
Hannah [name] , Thank you for taking time to come for your Medicare Wellness Visit. I appreciate your ongoing commitment to your health goals. Please review the following plan we discussed and let me know if I can assist you in the future.   Screening recommendations/referrals: Colonoscopy: completed 03/20/2013 Mammogram: completed 03/05/2020 Bone Density: completed 07/30/2018 Recommended yearly ophthalmology/optometry visit for glaucoma screening and checkup Recommended yearly dental visit for hygiene and checkup  Vaccinations: Influenza vaccine: today Pneumococcal vaccine: completed 05/18/2019 Tdap vaccine: completed 06/15/2019 Shingles vaccine: discussed   Covid-19: 12/19/2019, 11/20/2019  Advanced directives: Please bring a copy of your POA (Power of Attorney) and/or Living Will to your next appointment.   Conditions/risks identified: none  Next appointment: 10/31/2020 at 11:15  Follow up in one year for your annual wellness visit    Preventive Care 65 Years and Older, Female Preventive care refers to lifestyle choices and visits with your health care provider that can promote health and wellness. What does preventive care include?  A yearly physical exam. This is also called an annual well check.  Dental exams once or twice a year.  Routine eye exams. Ask your health care provider how often you should have your eyes checked.  Personal lifestyle choices, including:  Daily care of your teeth and gums.  Regular physical activity.  Eating a healthy diet.  Avoiding tobacco and drug use.  Limiting alcohol use.  Practicing safe sex.  Taking low-dose aspirin every day.  Taking vitamin and mineral supplements as recommended by your health care provider. What happens during an annual well check? The services and screenings done by your health care provider during your annual well check will depend on your age, overall health, lifestyle risk factors, and family history of  disease. Counseling  Your health care provider may ask you questions about your:  Alcohol use.  Tobacco use.  Drug use.  Emotional well-being.  Home and relationship well-being.  Sexual activity.  Eating habits.  History of falls.  Memory and ability to understand (cognition).  Work and work Statistician.  Reproductive health. Screening  You may have the following tests or measurements:  Height, weight, and BMI.  Blood pressure.  Lipid and cholesterol levels. These may be checked every 5 years, or more frequently if you are over 27 years old.  Skin check.  Lung cancer screening. You may have this screening every year starting at age 75 if you have a 30-pack-year history of smoking and currently smoke or have quit within the past 15 years.  Fecal occult blood test (FOBT) of the stool. You may have this test every year starting at age 45.  Flexible sigmoidoscopy or colonoscopy. You may have a sigmoidoscopy every 5 years or a colonoscopy every 10 years starting at age 12.  Hepatitis C blood test.  Hepatitis B blood test.  Sexually transmitted disease (STD) testing.  Diabetes screening. This is done by checking your blood sugar (glucose) after you have not eaten for a while (fasting). You may have this done every 1-3 years.  Bone density scan. This is done to screen for osteoporosis. You may have this done starting at age 75.  Mammogram. This may be done every 1-2 years. Talk to your health care provider about how often you should have regular mammograms. Talk with your health care provider about your test results, treatment options, and if necessary, the need for more tests. Vaccines  Your health care provider may recommend certain vaccines, such as:  Influenza vaccine. This  is recommended every year.  Tetanus, diphtheria, and acellular pertussis (Tdap, Td) vaccine. You may need a Td booster every 10 years.  Zoster vaccine. You may need this after age  75.  Pneumococcal 13-valent conjugate (PCV13) vaccine. One dose is recommended after age 75.  Pneumococcal polysaccharide (PPSV23) vaccine. One dose is recommended after age 21. Talk to your health care provider about which screenings and vaccines you need and how often you need them. This information is not intended to replace advice given to you by your health care provider. Make sure you discuss any questions you have with your health care provider. Document Released: 10/11/2015 Document Revised: 06/03/2016 Document Reviewed: 07/16/2015 Elsevier Interactive Patient Education  2017 [place] Prevention in the Home Falls can cause injuries. They can happen to people of all ages. There are many things you can do to make your home safe and to help prevent falls. What can I do on the outside of my home?  Regularly fix the edges of walkways and driveways and fix any cracks.  Remove anything that might make you trip as you walk through a door, such as a raised step or threshold.  Trim any bushes or trees on the path to your home.  Use bright outdoor lighting.  Clear any walking paths of anything that might make someone trip, such as rocks or tools.  Regularly check to see if handrails are loose or broken. Make sure that both sides of any steps have handrails.  Any raised decks and porches should have guardrails on the edges.  Have any leaves, snow, or ice cleared regularly.  Use sand or salt on walking paths during winter.  Clean up any spills in your garage right away. This includes oil or grease spills. What can I do in the bathroom?  Use night lights.  Install grab bars by the toilet and in the tub and shower. Do not use towel bars as grab bars.  Use non-skid mats or decals in the tub or shower.  If you need to sit down in the shower, use a plastic, non-slip stool.  Keep the floor dry. Clean up any water that spills on the floor as soon as it happens.  Remove  soap buildup in the tub or shower regularly.  Attach bath mats securely with double-sided non-slip rug tape.  Do not have throw rugs and other things on the floor that can make you trip. What can I do in the bedroom?  Use night lights.  Make sure that you have a light by your bed that is easy to reach.  Do not use any sheets or blankets that are too big for your bed. They should not hang down onto the floor.  Have a firm chair that has side arms. You can use this for support while you get dressed.  Do not have throw rugs and other things on the floor that can make you trip. What can I do in the kitchen?  Clean up any spills right away.  Avoid walking on wet floors.  Keep items that you use a lot in easy-to-reach places.  If you need to reach something above you, use a strong step stool that has a grab bar.  Keep electrical cords out of the way.  Do not use floor polish or wax that makes floors slippery. If you must use wax, use non-skid floor wax.  Do not have throw rugs and other things on the floor that can make you  trip. What can I do with my stairs?  Do not leave any items on the stairs.  Make sure that there are handrails on both sides of the stairs and use them. Fix handrails that are broken or loose. Make sure that handrails are as long as the stairways.  Check any carpeting to make sure that it is firmly attached to the stairs. Fix any carpet that is loose or worn.  Avoid having throw rugs at the top or bottom of the stairs. If you do have throw rugs, attach them to the floor with carpet tape.  Make sure that you have a light switch at the top of the stairs and the bottom of the stairs. If you do not have them, ask someone to add them for you. What else can I do to help prevent falls?  Wear shoes that:  Do not have high heels.  Have rubber bottoms.  Are comfortable and fit you well.  Are closed at the toe. Do not wear sandals.  If you use a  stepladder:  Make sure that it is fully opened. Do not climb a closed stepladder.  Make sure that both sides of the stepladder are locked into place.  Ask someone to hold it for you, if possible.  Clearly mark and make sure that you can see:  Any grab bars or handrails.  First and last steps.  Where the edge of each step is.  Use tools that help you move around (mobility aids) if they are needed. These include:  Canes.  Walkers.  Scooters.  Crutches.  Turn on the lights when you go into a dark area. Replace any light bulbs as soon as they burn out.  Set up your furniture so you have a clear path. Avoid moving your furniture around.  If any of your floors are uneven, fix them.  If there are any pets around you, be aware of where they are.  Review your medicines with your doctor. Some medicines can make you feel dizzy. This can increase your chance of falling. Ask your doctor what other things that you can do to help prevent falls. This information is not intended to replace advice given to you by your health care provider. Make sure you discuss any questions you have with your health care provider. Document Released: 07/11/2009 Document Revised: 02/20/2016 Document Reviewed: 10/19/2014 Elsevier Interactive Patient Education  2017 [employer].

## 2020-06-19 NOTE — Progress Notes (Signed)
This visit occurred during the SARS-CoV-2 public health emergency.  Safety protocols were in place, including screening questions prior to the visit, additional usage of staff PPE, and extensive cleaning of exam room while observing appropriate contact time as indicated for disinfecting solutions.  Subjective:   Hannah [name] is a 74 y.o. female who presents for Medicare Annual (Subsequent) preventive examination.  Review of Systems     Cardiac Risk Factors include: advanced age (>63mn, >>54women);diabetes mellitus;hypertension;obesity (BMI >30kg/m2)     Objective:    Today's Vitals   06/19/20 1116 06/19/20 1119  BP: 140/72   Pulse: (!) 54   Temp: 98.1 F (36.7 C)   TempSrc: Oral   Weight: 154 lb (69.9 kg)   Height: 4' 9.4" (1.458 m)   PainSc:  5    Body mass index is 32.86 kg/m.  Advanced Directives 06/19/2020 12/26/2019 07/24/2019 06/15/2019 06/20/2018 02/23/2018 08/12/2017  Does Patient Have a Medical Advance Directive? Yes Yes Yes Yes Yes Yes Yes  Type of AParamedicof ACentral CityLiving will HU[place]Living will - HTetoniaLiving will HLake IsabellaLiving will HHavanaLiving will HPrairie du RocherLiving will  Does patient want to make changes to medical advance directive? - No - Patient declined No - Patient declined - No - Patient declined - -  Copy of HSelmerin Chart? No - copy requested Yes - validated most recent copy scanned in chart (See row information) - No - copy requested Yes Yes Yes    Current Medications (verified) Outpatient Encounter Medications as of 06/19/2020  Medication Sig   albuterol (PROVENTIL HFA;VENTOLIN HFA) 108 (90 Base) MCG/ACT inhaler Inhale 2 puffs into the lungs every 6 (six) hours as needed for wheezing or shortness of breath.   Alcohol Swabs (ALCOHOL PADS) 70 % PADS Use as directed to check blood sugars 1 time  per day dx: e11.22   allopurinol (ZYLOPRIM) 100 MG tablet TAKE 1 TABLET BY MOUTH EVERY DAY FOR GOUT   amLODipine (NORVASC) 5 MG tablet Take 1 tablet (5 mg total) by mouth daily. 1/2 tab in the am and 1 tab in the pm   aspirin EC 81 MG tablet Take 81 mg by mouth daily.   atorvastatin (LIPITOR) 80 MG tablet TAKE 1 TABLET BY MOUTH EVERY DAY   Blood Glucose Calibration (ACCU-CHEK AVIVA) SOLN    Blood Glucose Monitoring Suppl (ACCU-CHEK AVIVA PLUS) w/Device KIT Use as directed to check blood sugars 1 time per day dx: e11.22   budesonide-formoterol (SYMBICORT) 160-4.5 MCG/ACT inhaler INHALE 2 PUFFS BY MOUTH TWICE DAILY IN THE MORNING AND EVENING   Calcium Carb-Cholecalciferol (CALCIUM 600+D) 600-800 MG-UNIT TABS Take 1 tablet by mouth daily.    Cholecalciferol (VITAMIN D PO) Take 2,000 Units by mouth daily.    denosumab (PROLIA) 60 MG/ML SOSY injection Inject 60 mg into the skin every 6 (six) months.   hydroxychloroquine (PLAQUENIL) 200 MG tablet TAKE 1 TABLET BY MOUTH DAILY FOR RHEUMATOID ARTHRITIS   Hypromellose (ARTIFICIAL TEARS OP) Apply to eye as needed.   irbesartan (AVAPRO) 300 MG tablet TAKE 1 TABLET(300 MG) BY MOUTH DAILY   metFORMIN (GLUCOPHAGE) 500 MG tablet Take 1 tablet (500 mg total) by mouth daily with breakfast.   SYNTHROID 88 MCG tablet TAKE 1 TABLET BY MOUTH EVERY DAY MONDAY TO SATURDAY AND OFF ON SUNDAYS   tacrolimus (PROTOPIC) 0.1 % ointment Apply topically daily.   No facility-administered encounter medications on file  as of 06/19/2020.    Allergies (verified) Shellfish allergy   History: Past Medical History:  Diagnosis Date   Anemia    Arthritis    Asthma    Diabetes mellitus without complication ([place])    Gout 12/17/2014   patient reported   Hyperlipidemia    Hypertension    Systemic lupus erythematosus ([place])    Past Surgical History:  Procedure Laterality Date   CATARACT EXTRACTION Bilateral 2015   DILATION AND CURETTAGE OF UTERUS       DOPPLER ECHOCARDIOGRAPHY  05/2018   Internist to review with pt; potential heart murmur 06/20/18   keratosis removal  2021   SKIN SURGERY  11/30/2018   left side of face   Family History  Problem Relation Age of Onset   Heart disease Father    Diabetes Father    Hypertension Mother    Hypertension Sister    Hypertension Sister    Leukemia Sister    Social History   Socioeconomic History   Marital status: Single    Spouse name: Not on file   Number of children: 0   Years of education: Not on file   Highest education level: Not on file  Occupational History   Occupation: retired  Tobacco Use   Smoking status: Never Smoker   Smokeless tobacco: Never Used  Scientific laboratory technician Use: Never used  Substance and Sexual Activity   Alcohol use: Not Currently    Comment: rarely    Drug use: No   Sexual activity: Not Currently    Comment: 1ST intercourse- 72 , partners- 60,   Other Topics Concern   Not on file  Social History Narrative   Not on file   Social Determinants of Health   Financial Resource Strain: Low Risk    Difficulty of Paying Living Expenses: Not hard at all  Food Insecurity: No Food Insecurity   Worried About Charity fundraiser in the Last Year: Never true   Arboriculturist in the Last Year: Never true  Transportation Needs: No Transportation Needs   Lack of Transportation (Medical): No   Lack of Transportation (Non-Medical): No  Physical Activity: Sufficiently Active   Days of Exercise per Week: 4 days   Minutes of Exercise per Session: 40 min  Stress: Stress Concern Present   Feeling of Stress : To some extent  Social Connections:    Frequency of Communication with Friends and Family: Not on file   Frequency of Social Gatherings with Friends and Family: Not on file   Attends Religious Services: Not on Electrical engineer or Organizations: Not on file   Attends Archivist Meetings: Not on file    Marital Status: Not on file    Tobacco Counseling Counseling given: Not Answered   Clinical Intake:  Pre-visit preparation completed: Yes  Pain : 0-10 Pain Score: 5  Pain Type: Chronic pain Pain Location: Knee Pain Orientation: Left, Right Pain Descriptors / Indicators: Aching Pain Onset: More than a month ago Pain Frequency: Intermittent Pain Relieving Factors: cortisone shot  Pain Relieving Factors: cortisone shot  Nutritional Status: BMI > 30  Obese Nutritional Risks: None Diabetes: Yes  How often do you need to have someone help you when you read instructions, pamphlets, or other written materials from your doctor or pharmacy?: 1 - Never What is the last grade level you completed in school?: college  Diabetic? Yes Nutrition Risk Assessment:  Has the patient  had any N/V/D within the last 2 months?  No  Does the patient have any non-healing wounds?  No  Has the patient had any unintentional weight loss or weight gain?  No   Diabetes:  Is the patient diabetic?  Yes  If diabetic, was a CBG obtained today?  No  Did the patient bring in their glucometer from home?  No  How often do you monitor your CBG's? daily.   Financial Strains and Diabetes Management:  Are you having any financial strains with the device, your supplies or your medication? No .  Does the patient want to be seen by Chronic Care Management for management of their diabetes?  No  Would the patient like to be referred to a Nutritionist or for Diabetic Management?  No   Diabetic Exams:  Diabetic Eye Exam: Completed 08/17/2019 Diabetic Foot Exam: Overdue, Pt has been advised about the importance in completing this exam. Pt is scheduled for diabetic foot exam on today.   Interpreter Needed?: No  Information entered by :: NAllen LPN   Activities of Daily Living In your present state of health, do you have any difficulty performing the following activities: 06/19/2020 06/19/2020  Hearing? N N   Vision? N N  Difficulty concentrating or making decisions? N N  Walking or climbing stairs? Y Y  Comment with knees knee problems  Dressing or bathing? N N  Doing errands, shopping? N N  Preparing Food and eating ? N -  Using the Toilet? N -  In the past six months, have you accidently leaked urine? N -  Do you have problems with loss of bowel control? N -  Managing your Medications? N -  Managing your Finances? N -  Housekeeping or managing your Housekeeping? N -  Some recent data might be hidden    Patient Care Team: Glendale Chard, MD as PCP - General (Internal Medicine) Nahser, Wonda Cheng, MD as PCP - Cardiology (Cardiology) Rex Kras, Claudette Stapler, RN as [place] [facility] [place], Tillie Rung as Social Worker [place], Kennieth Francois, [facility] (Pharmacist)  Indicate any recent Medical Services you may have received from other than [facility] providers in the past year (date may be approximate).     Assessment:   This is a routine wellness examination for Alla.  Hearing/Vision screen  Hearing Screening   125Hz  250Hz  500Hz  1000Hz  2000Hz  3000Hz  4000Hz  6000Hz  8000Hz   Right ear:           Left ear:           Vision Screening Comments: Regular eye exams, Dr. Katy Fitch   Dietary issues and exercise activities discussed: Current Exercise Habits: Structured exercise class, Type of exercise: strength training/weights;yoga;stretching, Time (Minutes): 45, Frequency (Times/Week): 4, Weekly Exercise (Minutes/Week): 180  Goals      "I want to keep my RA under good control" (pt-stated)      Current Barriers:   Knowledge Deficits related to disease process and Self Health management of RA  Chronic Disease Management support and education needs related to RA, DM, Hypertensive Neuropathy, CKDIII  Nurse Case Manager Clinical Goal(s):   Over the next 90 days, patient will work with CCM RN CM to address needs related to disease education and support for RA  Over the next 90 days, the  patient will demonstrate ongoing self health care management ability as evidenced by patient will report having no RA exacerbations *  CCM RN CM Interventions:  03/06/20 call completed with patient  Evaluation of current  treatment plan related to RA and patient's adherence to plan as established by provider  Discussed and reviewed the following Assessment/Plan per Dr. Estanislado Pandy following Rheumatology f/u visit completed on 12/06/19;  o Assessment / Plan:     o Visit Diagnoses: Rheumatoid arthritis involving multiple sites with positive rheumatoid factor ([place]) - Positive RF, positive anti-CCP.  Patient has no active synovitis.  She has been doing well on Plaquenil. o Other systemic lupus erythematosus with other organ involvement ([place]) - Positive ANA, positive Smith, positive RNP.  Her most recent labs did not show active autoimmune disease.  She has been tolerating Plaquenil well. o High risk medication use - Plaquenil 200 mg 1 tablet by mouth daily.eye exam: 08/17/19.  Her labs have been stable. o Idiopathic chronic gout of multiple sites without tophus - Allopurinol 100 mg 1 tablet by mouth daily.  Uric acid is in desirable range.  She denies any gout flare. o Instability of left knee joint -she has been experiencing instability in her left knee joint.  Plan: XR KNEE 3 VIEW LEFT.  X-rays reveal severe osteoarthritis of the lateral compartment and severe chondromalacia patella.  She would benefit from total knee replacement.  She is not prepared to have total knee replacement yet.  She states she will think about it and will contact us for referral.  Weight loss diet and exercise was emphasized. o Age-related osteoporosis without current pathological fracture - Patient is on Prolia injections every 6 months for osteoporosis. She was approved for a grant. last injection: 05/26/2019  - Plan: denosumab (PROLIA) injection 60 mg today. o History of vitamin D deficiency-she takes supplement. o Primary  osteoarthritis of both knees o MGUS (monoclonal gammopathy of unknown significance) - She is followed by Dr. Irene Limbo.  o History of hypertension-blood pressure is mildly elevated today o History of chronic kidney disease o History of asthma o History of diabetes mellitus o History of anemia o Orders: o  o  o  Orders Placed This Encounter o  Procedures o   o XR KNEE 3 VIEW LEFT    o  o  Meds ordered this encounter o  Medications o   o denosumab (PROLIA) injection 60 mg  Discussed next scheduled Rheumatology f/u visit scheduled for 04/16/20 @ 2:45 PM  Discussed plans with patient for ongoing care management follow up and provided patient with direct contact information for care management team  Patient Self Care Activities:   Self administers medications as prescribed  Attends all scheduled provider appointments  Calls pharmacy for medication refills  Performs ADL's independently  Performs IADL's independently  Calls provider office for new concerns or questions  Please see past updates related to this goal by clicking on the "Past Updates" button in the selected goal        "My hemoglobin continues to be low" (pt-stated)      CARE PLAN ENTRY (see longtitudinal plan of care for additional care plan information)  Current Barriers:   Knowledge Deficits related to disease process and Self Health management of Anemia  Chronic Disease Management support and education needs related to CKDIII, Hypertensive Nephropathy, DM   Nurse Case Manager Clinical Goal(s):   Over the next 90 days, patient will work with the CCM team and healthcare provider to address needs related to disease education and support to improve Self Health management of Anemia  CCM RN CM Interventions:  03/06/20 call completed with patient   Evaluation of current treatment plan related to Anemia and patient's  adherence to plan as established by provider  Determined patient completed a follow up visit  with Dr. Irene Limbo on 12/26/19 with the following Assessment/Plan discussed;  o ASSESSMENT & PLAN:  o #1 IgG kappa monoclonal gammopathy of undetermined significance. Bone survey X-ray showed no lytic lesions. M spike is 0.6 g/dL in 10/016 and then 0.5g/dl in 10/2015. 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 o Bone marrow biopsy does show about 13% plasma cells however these appear to be polyclonal and likely related to a possible underlying inflammatory disorder. o Less likely a biclonal plasma cell dyscrasia. o #2 Normocytic normochromic anemia likely related to chronic inflammation from recent diagnosis of lupus/rheumatoid arthritis + CKD.  o Increased acanthocytes on peripheral blood smear -no overt evidence of liver disease. Could be a marker of some MDS o Bone marrow biopsy did not show overt plasma cell dyscrasia or myelodysplastic syndrome. 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 o Bone marrow biopsy showed decreased iron stores. o Ferritin and iron have been trending down, she has not noticed any issues with bruising or bleeding recently. ?absorption issue. I have given her the option of IV iron which she has previously had without issue. She would like to have this.  o PLAN: o -Discussed pt labwork today, 12/26/19; Hgb is stable, blood chemistries are stable  o -Discussed 12/26/2019 Iron and TIBC is as follows: all values are WNL except for TIBC at 225 o -Discussed 12/26/2019 Ferritin at 255 - Goal Ferritin >100 o -Discussed 12/26/2019 MMP is in progress o -Will hold IV Iron due to Ferritin being above goal. Will have pt continue PO Iron Polysaccharide once per day.  o -Will continue to optimize pt's iron levels and manage chronic inflammation from RA (with Rheumatologist) to improve anemia.  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 Estanislado Pandy. o  Plan o -Continue optimization of her autoimmune disorder treatment with rheumatology Dr. Estanislado Pandy. o -She has no symptoms or findings suggestive of multiple myeloma at this time. o -will Follow-up on pending labs from today SPEP with [facility] o -Continue follow-up with primary care physician and rheumatology. o FOLLOW UP: o RTC with Dr Irene Limbo with labs in 6 months. Plz schedule labs 1 week prior to clinic visit.  Discussed plans with patient for ongoing care management follow up and provided patient with direct contact information for care management team  Patient Self Care Activities:   Self administers medications as prescribed  Attends all scheduled provider appointments  Calls pharmacy for medication refills  Performs ADL's independently  Performs IADL's independently  Calls provider office for new concerns or questions  Please see past updates related to this goal by clicking on the "Past Updates" button in the selected goal        "To maintain my kidney function" (pt-stated)      Current Barriers:   Knowledge Deficits related to disease process and Self Health management of CKD  Chronic Disease Management support and education needs related to CKDIII, Hypertensive Neuropathy, DM  Nurse Case Manager Clinical Goal(s):   Over the  next 90 days, patient will work with CCM RN CM to address needs related to disease education and supprt to improve Self Health management of CKD  CCM RN CM Interventions:  03/06/20 call completed with patient  Evaluation of current treatment plan related to CKD and patient's adherence to plan as established by provider.  Reviewed and discussed patient's GFR, BUN and Creatinine;  Provided education to patient re: how to maintain kidney function by controlling diabetes and high blood pressure, staying at a healthy weight, staying well hydrated, and eating a healthy diet, implementing exercise into daily routine and adhering to MD follow up to allow for close monitoring of kidney function   Determined Dr.Webb continues to follow this condition with upcoming appointment scheduled  Determined patient is Self Checking her BP at home with the following readings recorded;  o 134/66 65 o 142/68 75 o 132/68 68 o 139/86 64 o 133/88 72 o this am 121/68 39   Determined patient is attending Yoga twice weekly, she [facility]  Determined patient will continue to Self Monitor her BP and report to Dr. Harrel Lemon. Justin Mend accordingly  Educated on BP target goal, <130/80  Mailed printed educational materials related to stages of Chronic Kidney Disease  Discussed plans with patient for ongoing care management follow up and provided patient with direct contact information for care management team  Patient Self Care Activities:   Self administers medications as prescribed  Attends all scheduled provider appointments  Calls pharmacy for medication refills  Performs ADL's independently  Performs IADL's independently  Calls provider office for new concerns or questions  Please see past updates related to this goal by clicking on the "Past Updates" button in the selected goal        Patient Stated      06/15/2019, wants to get off some medications      Patient Stated      06/19/2020, wants to lose 25-30 pounds      [place] (see longitudinal plan of care for additional care plan information)  Current Barriers:   Chronic Disease Management support, education, and care coordination needs related to Hypertension, Hyperlipidemia, Diabetes, Asthma, Hypothyroidism, and Osteoporosis   Hypertension BP Readings  from Last 3 Encounters:  03/20/20 (!) 160/78  12/26/19 (!) 155/64  11/29/19 (!) 144/73    Pharmacist Clinical Goal(s): o Over the next 90 days, patient will work with PharmD and providers to achieve BP goal <130/80  Current regimen:  o Amlodipine 2.53m every morning and 547mevery evening o Irbesartan 30054maily  Interventions: o Patient has been able to receive amlodipine 2.5mg81md 5mg 85mlets and is appreciative she does not have to cut 5mg t82mets in half anymore o Provided dietary and exercise recommendations  Patient self care activities - Over the next 14 days, patient will: o Check BP daily, document, and provide at future appointments o Ensure daily salt intake < 2300 mg/day o Exercise for at least 30 minutes per day 5 days per week (150 minutes per week)  Hyperlipidemia Lab Results  Component Value Date/Time   [facility] 04:35 PM    Pharmacist Clinical Goal(s): o Over the next 90 days, patient will work with PharmD and providers to maintain LDL goal < 70  Current regimen:  o Atorvastatin 80 mg daily  Interventions: o Provided dietary and exercise recommendations  Patient self care activities - Over the next 90 days,  patient will: o Take cholesterol medication daily as directed o Exercise for at least 30 minutes per day 5 days per week (150 minutes per week)  Diabetes Lab Results  Component Value Date/Time   HGBA1C 5.7 (H) 03/20/2020 04:45 PM   HGBA1C 5.6 10/31/2019 04:35 PM    Pharmacist Clinical Goal(s): o Over the next 90 days, patient will work with PharmD and providers to maintain A1c goal <7%  Current regimen:  o Metformin 549m daily  Interventions: o Provided dietary and exercise recommendations  Patient self care activities - Over the next 90 days, patient will: o Check blood sugar several times per week, document, and provide at future appointments o Contact provider with any episodes of hypoglycemia o Exercise for at least 30  minutes per day 5 days per week (150 minutes per week)  Hypothyroidism  Pharmacist Clinical Goal(s) o Over the next 90 days, patient will work with PharmD and providers to obtain Synthroid medication  Current regimen:  o Synthroid 855m daily Monday through Saturday, skip Sundays  Interventions: o Discussed patient assistance program for Synthroid through [employer] of required documents for application (proof of income and medication out of pocket spending report from pharmacy)  Patient self care activities - Over the next 90 days, patient will: o Continue taking Synthroid daily as directed o Bring required documents to next office visit to complete patient assistance application  Medication management  Pharmacist Clinical Goal(s): o Over the next 90 days, patient will work with PharmD and providers to achieve optimal medication adherence  Current pharmacy: [facility]  Interventions o Comprehensive medication review performed. o Continue current medication management strategy  Patient self care activities - Over the next 90 days, patient will: o Focus on medication adherence by considering use of pill box to aide in organization of medication o Take medications as prescribed o Report any questions or concerns to PharmD and/or provider(s)  Please see past updates related to this goal by clicking on the "Past Updates" button in the selected goal        Synthroid Patient AsMooretonsee longitudinal plan of care for additional care plan information)  Current Barriers:   Financial Barriers: patient has HuMcGraw-Hillnd reports copay for Synthroid is cost prohibitive at this time  Pharmacist Clinical Goal(s):   Over the next 30 days, patient will work with PharmD and providers to relieve medication access concerns  Interventions:  Comprehensive medication review completed; medication list updated in electronic medical record.    Inter-disciplinary care team collaboration (see longitudinal plan of care)  Synthroid by [employer]: Patient meets income criteria for this medication's patient assistance program. Reviewed application process. Patient provided proof of income, out of pocket spend report, and signed application. Will collaborate with primary care provider Dr. RoGlendale Chardor their portion of application. Completed application faxed to [employer] patient assistance program on 8/4/0/98Patient application was denied, but may be appealed by providing additional out of pocket medical and prescription expenses. [employer] faxed out of pocket expense form to office  Patient provided out of pocket medical and prescription expenses for the past year in the form of estimation of benefits statements [facility]  Will send completed out of pocket expense form and relevant documentation as provided by the patient to [facility] assistance program.   Samples given to patient today in the office (28 days)  Patient Self Care Activities:   Patient provided necessary  out of pocket medical and prescription expenses (estimation of benefits statements)   Please see past updates related to this goal by clicking on the "Past Updates" button in the selected goal        Depression Screen PHQ 2/9 Scores 06/19/2020 06/19/2020 06/15/2019 06/15/2019 02/23/2019 10/25/2018 07/21/2018  PHQ - 2 Score 0 0 0 0 0 0 0  PHQ- 9 Score - - 6 - - - -    Fall Risk Fall Risk  06/19/2020 06/19/2020 06/15/2019 06/15/2019 02/23/2019  Falls in the past year? 0 0 0 0 0  Risk for fall due to : Medication side effect - Medication side effect - -  Follow up Falls evaluation completed;Education provided;Falls prevention discussed - Falls evaluation completed;Education provided;Falls prevention discussed - -    Any stairs in or around the home? No  If so, are there any without handrails? n/a Home free of loose throw rugs in walkways, pet beds, electrical  cords, etc? Yes  Adequate lighting in your home to reduce risk of falls? Yes   ASSISTIVE DEVICES UTILIZED TO PREVENT FALLS:  Life alert? No  Use of a cane, walker or w/c? No  Grab bars in the bathroom? Yes  Shower chair or bench in shower? No  Elevated toilet seat or a handicapped toilet? No   TIMED UP AND GO:  Was the test performed? No . .   Gait slow and steady without use of assistive device  Cognitive Function:     6CIT Screen 06/19/2020 06/15/2019  What Year? 0 points 0 points  What month? 0 points 0 points  What time? 0 points 0 points  Count back from 20 0 points 0 points  Months in reverse 0 points 2 points  Repeat phrase 2 points 0 points  Total Score 2 2    Immunizations Immunization History  Administered Date(s) Administered   19-influenza Whole 07/15/2012   DTaP 06/18/2019   Fluad Quad(high Dose 65+) 06/19/2020   Influenza Split 06/28/2014   Influenza, High Dose Seasonal PF 05/18/2019   Influenza,inj,Quad PF,6+ Mos 06/20/2018   Moderna SARS-COVID-2 Vaccination 11/20/2019, 12/19/2019   Pneumococcal Conjugate-13 05/04/2018   Pneumococcal Polysaccharide-23 05/18/2019   Pneumococcal-Unspecified 06/28/2014   Tdap 06/15/2019    TDAP status: Up to date Flu Vaccine status: Up to date Pneumococcal vaccine status: Up to date Covid-19 vaccine status: Completed vaccines  Qualifies for Shingles Vaccine? Yes   Zostavax completed No   Shingrix Completed?: No.    Education has been provided regarding the importance of this vaccine. Patient has been advised to call insurance company to determine out of pocket expense if they have not yet received this vaccine. Advised may also receive vaccine at local pharmacy or Health Dept. Verbalized acceptance and understanding.  Screening Tests Health Maintenance  Topic Date Due   FOOT EXAM  06/14/2020   OPHTHALMOLOGY EXAM  08/16/2020   HEMOGLOBIN A1C  09/19/2020   MAMMOGRAM  03/05/2022   COLONOSCOPY   03/21/2023   TETANUS/TDAP  06/17/2029   INFLUENZA VACCINE  Completed   DEXA SCAN  Completed   COVID-19 Vaccine  Completed   Hepatitis C Screening  Completed   PNA vac Low Risk Adult  Completed    Health Maintenance  Health Maintenance Due  Topic Date Due   FOOT EXAM  06/14/2020    Colorectal cancer screening: Completed 03/20/2013. Repeat every 10 years Mammogram status: Completed 03/05/2020. Repeat every year Bone Density status: Completed 07/30/2018. Results reflect: Bone density results: OSTEOPOROSIS. Repeat every 2  years.  Lung Cancer Screening: (Low Dose CT Chest recommended if Age 4-80 years, 30 pack-year currently smoking OR have quit w/in 15years.) does not qualify.   Lung Cancer Screening Referral: no  Additional Screening:  Hepatitis C Screening: does qualify; Completed 03/24/2017  Vision Screening: Recommended annual ophthalmology exams for early detection of glaucoma and other disorders of the eye. Is the patient up to date with their annual eye exam?  Yes  Who is the provider or what is the name of the office in which the patient attends annual eye exams? Dr. Katy Fitch If pt is not established with a provider, would they like to be referred to a provider to establish care? No .   Dental Screening: Recommended annual dental exams for proper oral hygiene  Community Resource Referral / Chronic Care Management: CRR required this visit?  No   CCM required this visit?  No      Plan:     I have personally reviewed and noted the following in the patients chart:    Medical and social history  Use of alcohol, tobacco or illicit drugs   Current medications and supplements  Functional ability and status  Nutritional status  Physical activity  Advanced directives  List of other physicians  Hospitalizations, surgeries, and ER visits in previous 12 months  Vitals  Screenings to include cognitive, depression, and falls  Referrals and  appointments  In addition, I have reviewed and discussed with patient certain preventive protocols, quality metrics, and best practice recommendations. A written personalized care plan for preventive services as well as general preventive health recommendations were provided to patient.     Kellie Simmering, LPN   09/29/5484   Nurse Notes:

## 2020-06-20 ENCOUNTER — Inpatient Hospital Stay: Payer: Medicare HMO

## 2020-06-20 ENCOUNTER — Other Ambulatory Visit: Payer: Self-pay

## 2020-06-20 DIAGNOSIS — E1122 Type 2 diabetes mellitus with diabetic chronic kidney disease: Secondary | ICD-10-CM

## 2020-06-20 DIAGNOSIS — N183 Chronic kidney disease, stage 3 unspecified: Secondary | ICD-10-CM

## 2020-06-20 LAB — CBC WITH DIFFERENTIAL/PLATELET
Basophils Absolute: 0.1 10*3/uL (ref 0.0–0.2)
Basos: 1 %
EOS (ABSOLUTE): 0.4 10*3/uL (ref 0.0–0.4)
Eos: 6 %
Hematocrit: 33.7 % — ABNORMAL LOW (ref 34.0–46.6)
Hemoglobin: 11.1 g/dL (ref 11.1–15.9)
Immature Grans (Abs): 0 10*3/uL (ref 0.0–0.1)
Immature Granulocytes: 0 %
Lymphocytes Absolute: 1 10*3/uL (ref 0.7–3.1)
Lymphs: 16 %
MCH: 31.4 pg (ref 26.6–33.0)
MCHC: 32.9 g/dL (ref 31.5–35.7)
MCV: 95 fL (ref 79–97)
Monocytes Absolute: 0.5 10*3/uL (ref 0.1–0.9)
Monocytes: 8 %
Neutrophils Absolute: 4.2 10*3/uL (ref 1.4–7.0)
Neutrophils: 69 %
Platelets: 200 10*3/uL (ref 150–450)
RBC: 3.54 x10E6/uL — ABNORMAL LOW (ref 3.77–5.28)
RDW: 13.6 % (ref 11.7–15.4)
WBC: 6.1 10*3/uL (ref 3.4–10.8)

## 2020-06-20 LAB — CMP14+EGFR
ALT: 14 IU/L (ref 0–32)
AST: 20 IU/L (ref 0–40)
Albumin/Globulin Ratio: 1.7 (ref 1.2–2.2)
Albumin: 4.5 g/dL (ref 3.7–4.7)
Alkaline Phosphatase: 53 IU/L (ref 44–121)
BUN/Creatinine Ratio: 9 — ABNORMAL LOW (ref 12–28)
BUN: 11 mg/dL (ref 8–27)
Bilirubin Total: 0.7 mg/dL (ref 0.0–1.2)
CO2: 22 mmol/L (ref 20–29)
Calcium: 10 mg/dL (ref 8.7–10.3)
Chloride: 100 mmol/L (ref 96–106)
Creatinine, Ser: 1.29 mg/dL — ABNORMAL HIGH (ref 0.57–1.00)
GFR calc Af Amer: 47 mL/min/{1.73_m2} — ABNORMAL LOW (ref >59)
GFR calc non Af Amer: 41 mL/min/{1.73_m2} — ABNORMAL LOW (ref >59)
Globulin, Total: 2.7 g/dL (ref 1.5–4.5)
Glucose: 84 mg/dL (ref 65–99)
Potassium: 4.1 mmol/L (ref 3.5–5.2)
Sodium: 140 mmol/L (ref 134–144)
Total Protein: 7.2 g/dL (ref 6.0–8.5)

## 2020-06-20 LAB — IRON AND TIBC
Iron Saturation: 30 % (ref 15–55)
Iron: 75 ug/dL (ref 27–139)
Total Iron Binding Capacity: 246 ug/dL — ABNORMAL LOW (ref 250–450)
UIBC: 171 ug/dL (ref 118–369)

## 2020-06-20 LAB — FERRITIN: Ferritin: 275 ng/mL — ABNORMAL HIGH (ref 15–150)

## 2020-06-20 LAB — HEMOGLOBIN A1C
Est. average glucose Bld gHb Est-mCnc: 111 mg/dL
Hgb A1c MFr Bld: 5.5 % (ref 4.8–5.6)

## 2020-06-20 MED ORDER — TRUE METRIX METER W/DEVICE KIT: PACK | 1 refills | Status: DC

## 2020-06-20 MED ORDER — TRUE METRIX BLOOD GLUCOSE TEST VI STRP: ORAL_STRIP | 2 refills | Status: DC

## 2020-06-20 MED ORDER — TRUEPLUS LANCETS 33G MISC: 2 refills | Status: DC

## 2020-06-20 MED ORDER — ALCOHOL PADS 70 % PADS: MEDICATED_PAD | 2 refills | Status: DC

## 2020-06-20 MED ORDER — TRUE METRIX LEVEL 1 LOW VI SOLN: 1 refills | Status: DC

## 2020-06-21 ENCOUNTER — Telehealth: Payer: Self-pay | Admitting: Rheumatology

## 2020-06-21 NOTE — Telephone Encounter (Signed)
Prolia injection: 06/10/2020 Labs: 06/19/2020 (CBC & CMP) creatinine 1.29, GFR 47, BUN/creat ratio 9, RBC 3.54, hematocrit 33.7.  Are these labs sufficient for 9 days post-prolia?   Patient was due to come in today to have labs drawn after prolia injection but had labs on 06/19/2020 with Dr. Baird Cancer.

## 2020-06-21 NOTE — Telephone Encounter (Signed)
Patient had extensive labs drawn Wednesday with her PCP.  Patient is due to come in this pm for Prolia labs. Could you please check to see if labs done Wednesday will be good to cover labs today? Please call patient on home #, and leave detailed message as to if she needs to still come in today, or not. ( Labs should be in the [facility] system.)

## 2020-06-21 NOTE — Telephone Encounter (Signed)
Yes, it should be sufficient.

## 2020-06-21 NOTE — Telephone Encounter (Addendum)
Spoke with patient and advised her the labs from 06/19/2020 are sufficient and she does not need to come in today for labs. Patient verbalized understanding.

## 2020-06-24 DIAGNOSIS — M170 Bilateral primary osteoarthritis of knee: Secondary | ICD-10-CM | POA: Diagnosis not present

## 2020-06-24 DIAGNOSIS — M1712 Unilateral primary osteoarthritis, left knee: Secondary | ICD-10-CM | POA: Diagnosis not present

## 2020-06-24 DIAGNOSIS — M25562 Pain in left knee: Secondary | ICD-10-CM | POA: Diagnosis not present

## 2020-06-24 DIAGNOSIS — M1711 Unilateral primary osteoarthritis, right knee: Secondary | ICD-10-CM | POA: Diagnosis not present

## 2020-06-24 DIAGNOSIS — M25561 Pain in right knee: Secondary | ICD-10-CM | POA: Diagnosis not present

## 2020-06-27 ENCOUNTER — Inpatient Hospital Stay: Payer: Medicare HMO | Attending: Hematology | Admitting: Hematology

## 2020-06-27 ENCOUNTER — Telehealth: Payer: Self-pay

## 2020-06-27 ENCOUNTER — Other Ambulatory Visit: Payer: Self-pay

## 2020-06-27 VITALS — BP 144/66 | HR 73 | Temp 97.7°F | Resp 18 | Ht <= 58 in | Wt 152.9 lb

## 2020-06-27 DIAGNOSIS — I10 Essential (primary) hypertension: Secondary | ICD-10-CM | POA: Diagnosis not present

## 2020-06-27 DIAGNOSIS — Z79899 Other long term (current) drug therapy: Secondary | ICD-10-CM | POA: Diagnosis not present

## 2020-06-27 DIAGNOSIS — G9589 Other specified diseases of spinal cord: Secondary | ICD-10-CM | POA: Insufficient documentation

## 2020-06-27 DIAGNOSIS — M329 Systemic lupus erythematosus, unspecified: Secondary | ICD-10-CM | POA: Insufficient documentation

## 2020-06-27 DIAGNOSIS — L80 Vitiligo: Secondary | ICD-10-CM | POA: Diagnosis not present

## 2020-06-27 DIAGNOSIS — D472 Monoclonal gammopathy: Secondary | ICD-10-CM | POA: Insufficient documentation

## 2020-06-27 DIAGNOSIS — K449 Diaphragmatic hernia without obstruction or gangrene: Secondary | ICD-10-CM | POA: Diagnosis not present

## 2020-06-27 DIAGNOSIS — D649 Anemia, unspecified: Secondary | ICD-10-CM

## 2020-06-27 DIAGNOSIS — M1990 Unspecified osteoarthritis, unspecified site: Secondary | ICD-10-CM | POA: Insufficient documentation

## 2020-06-27 DIAGNOSIS — M069 Rheumatoid arthritis, unspecified: Secondary | ICD-10-CM | POA: Insufficient documentation

## 2020-06-27 DIAGNOSIS — D508 Other iron deficiency anemias: Secondary | ICD-10-CM

## 2020-06-27 DIAGNOSIS — D638 Anemia in other chronic diseases classified elsewhere: Secondary | ICD-10-CM | POA: Insufficient documentation

## 2020-06-27 NOTE — Chronic Care Management (AMB) (Signed)
Chronic Care Management Pharmacy Assistant   Name: Hannah [name]  MRN: [id number] DOB: [birth year]  Reason for Encounter: Medication Review    PCP : [place]endale Chard, MD  Allergies:   Allergies  Allergen Reactions  . Shellfish Allergy Anaphylaxis and Hives    Medications: Outpatient Encounter Medications as of 06/27/2020  Medication Sig  . albuterol (PROVENTIL HFA;VENTOLIN HFA) 108 (90 Base) MCG/ACT inhaler Inhale 2 puffs into the lungs every 6 (six) hours as needed for wheezing or shortness of breath.  . Alcohol Swabs (ALCOHOL PADS) 70 % PADS Use as directed to check blood sugars 1 time per day dx: e11.22  . allopurinol (ZYLOPRIM) 100 MG tablet TAKE 1 TABLET BY MOUTH EVERY DAY FOR GOUT  . amLODipine (NORVASC) 5 MG tablet Take 1 tablet (5 mg total) by mouth daily. 1/2 tab in the am and 1 tab in the pm  . aspirin EC 81 MG tablet Take 81 mg by mouth daily.  [name] atorvastatin (LIPITOR) 80 MG tablet TAKE 1 TABLET BY MOUTH EVERY DAY  . Blood Glucose Calibration (TRUE METRIX LEVEL 1) Low SOLN Use as directed to check blood sugars 1 time per day dx: e11.22  . Blood Glucose Monitoring Suppl (TRUE METRIX METER) w/Device KIT Use as directed to check blood sugars 1 time per day dx: e11.22  . budesonide-formoterol (SYMBICORT) 160-4.5 MCG/ACT inhaler INHALE 2 PUFFS BY MOUTH TWICE DAILY IN THE MORNING AND EVENING  . Calcium Carb-Cholecalciferol (CALCIUM 600+D) 600-800 MG-UNIT TABS Take 1 tablet by mouth daily.   . Cholecalciferol (VITAMIN D PO) Take 2,000 Units by mouth daily.   [name] denosumab (PROLIA) 60 MG/ML SOSY injection Inject 60 mg into the skin every 6 (six) months.  . hydroxychloroquine (PLAQUENIL) 200 MG tablet TAKE 1 TABLET BY MOUTH DAILY FOR RHEUMATOID ARTHRITIS  . Hypromellose (ARTIFICIAL TEARS OP) Apply to eye as needed.  . irbesartan (AVAPRO) 300 MG tablet TAKE 1 TABLET(300 MG) BY MOUTH DAILY  . metFORMIN (GLUCOPHAGE) 500 MG tablet Take 1 tablet (500 mg total) by mouth daily with  breakfast.  . SYNTHROID 88 MCG tablet TAKE 1 TABLET BY MOUTH EVERY DAY MONDAY TO SATURDAY AND OFF ON SUNDAYS  . tacrolimus (PROTOPIC) 0.1 % ointment Apply topically daily.  [name] METRIX BLOOD GLUCOSE TEST test strip Use as directed to check blood sugars 1 time per day dx: e11.22  . TRUEplus Lancets 33G MISC Use as directed to check blood sugars 1 time per day dx: e11.22   No facility-administered encounter medications on file as of 06/27/2020.    Current Diagnosis: Patient Active Problem List   Diagnosis Date Noted  . PVC (premature ventricular contraction) 05/06/2020  . Type 2 diabetes mellitus with stage 3 chronic kidney disease, without long-term current use of insulin ([place]) 03/20/2020  . Palpitations 03/20/2020  . 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/2020  . Primary hypothyroidism 03/20/2020  . Vitiligo 03/20/2020  . Iron deficiency anemia 08/12/2017  . Osteoporosis 10/02/2016  . Systemic lupus erythematosus ([place]) 10/02/2016  . Rheumatoid arthritis involving multiple sites with positive rheumatoid factor ([place]) 10/02/2016  . High risk medication use 10/02/2016  . History of chronic kidney disease 10/02/2016  . 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/2016  . Primary osteoarthritis of both knees 10/02/2016  . Vitamin D deficiency 10/02/2016  . History of diabetes mellitus 10/02/2016  . History of hypertension 10/02/2016  . History of asthma 10/02/2016  . Absolute anemia   . MGUS (monoclonal gammopathy of unknown significance)   .  Normocytic anemia 03/21/2015  . Essential hypertension 03/20/2015  . Abnormal CT of the chest 07/30/2012  . Asthma 06/19/2012   06/27/2020- Reviewed chart and adherence measures. Per [employer] data patient's adherence measures for Irbesartan 300 mg is 100%, Atorvastatin 80 mg is 100% adherence for current year.  Follow-Up:  Pharmacist Review    Jannette Fogo, CPP Notified  Judithann Sheen, [place] Pharmacist  Assistant [phone]

## 2020-06-27 NOTE — Progress Notes (Signed)
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 The patient's last visit with [place] was on 12/26/2019. The pt reports that she is doing well overall.  The pt reports that she is taking one Iron Polysaccharide per day. She has been able to exercise four days per week. Her autoimmune condition remains stable. Pt notes new vitiligo, for which she has been seen by a Dermatologist. Pt has been feeling well overall.  Lab results (06/19/20) of CBC w/diff and CMP is as follows: all values are WNL except for RBC at 3.54, HCT at 33.7, Creatinine at 1.29, GFR Est Afr Am at 47, BUN/Creatinine Ratio at 9. 06/27/2020 Ferritin at 275 06/27/2020 Iron Panel is as follows: TIBC at 246,UIBC at 171, Iron at 75, Iron Sat at 30  On review of systems, pt denies any other symptoms.   REVIEW OF SYSTEMS:   A 10+ POINT REVIEW OF SYSTEMS WAS OBTAINED including neurology, dermatology, psychiatry, cardiac, respiratory, lymph, extremities, GI, GU, Musculoskeletal, constitutional, breasts, reproductive, HEENT.  All pertinent positives are noted in the HPI.  All others are negative.  . Past Medical History:  Diagnosis Date  . Anemia   . Arthritis   . Asthma   . Diabetes mellitus without complication ([place])   . Gout 12/17/2014   patient reported  . Hyperlipidemia   . Hypertension   .  Systemic lupus erythematosus ([place])   possible SLE/rheumatoid arthritis following with Hannah [name].  . Past Surgical History:  Procedure Laterality Date  . CATARACT EXTRACTION Bilateral 2015  . DILATION AND CURETTAGE OF UTERUS    . DOPPLER ECHOCARDIOGRAPHY  05/2018   Internist to review with pt; potential heart murmur 06/20/18  . keratosis removal  2021  . SKIN SURGERY  11/30/2018   left side of face    . Social History   Tobacco Use  . Smoking status: Never Smoker  . Smokeless tobacco: Never Used  Vaping Use  . Vaping Use: Never used  Substance Use Topics  . Alcohol use: Not Currently    Comment: rarely   . Drug use: No    ALLERGIES:  is allergic to shellfish allergy.  MEDICATIONS:  Current Outpatient Medications  Medication Sig Dispense Refill  . albuterol (PROVENTIL HFA;VENTOLIN HFA) 108 (90 Base) MCG/ACT inhaler Inhale 2 puffs into the lungs every 6 (six) hours as needed for wheezing or shortness of breath. 1 Inhaler 2  . Alcohol Swabs (ALCOHOL PADS) 70 % PADS Use as directed to check blood sugars 1 time per day dx: e11.22 150 each 2  . allopurinol (ZYLOPRIM) 100 MG tablet TAKE 1 TABLET BY MOUTH EVERY DAY FOR GOUT 90 tablet 1  . amLODipine (NORVASC) 5 MG tablet Take 1 tablet (5 mg total) by mouth daily. 1/2 tab in the am and 1 tab in the pm 180 tablet 1  . aspirin EC 81 MG tablet Take 81 mg by  mouth daily.    [name] atorvastatin (LIPITOR) 80 MG tablet TAKE 1 TABLET BY MOUTH EVERY DAY 90 tablet 1  . Blood Glucose Calibration (TRUE METRIX LEVEL 1) Low SOLN Use as directed to check blood sugars 1 time per day dx: e11.22 1 each 1  . Blood Glucose Monitoring Suppl (TRUE METRIX METER) w/Device KIT Use as directed to check blood sugars 1 time per day dx: e11.22 1 kit 1  . budesonide-formoterol (SYMBICORT) 160-4.5 MCG/ACT inhaler INHALE 2 PUFFS BY MOUTH TWICE DAILY IN THE MORNING AND EVENING 10.2 g 2  . Calcium Carb-Cholecalciferol (CALCIUM 600+D) 600-800 MG-UNIT TABS Take 1 tablet  by mouth daily.     . Cholecalciferol (VITAMIN D PO) Take 2,000 Units by mouth daily.     [name] denosumab (PROLIA) 60 MG/ML SOSY injection Inject 60 mg into the skin every 6 (six) months. 1 mL 0  . hydroxychloroquine (PLAQUENIL) 200 MG tablet TAKE 1 TABLET BY MOUTH DAILY FOR RHEUMATOID ARTHRITIS 90 tablet 0  . Hypromellose (ARTIFICIAL TEARS OP) Apply to eye as needed.    . irbesartan (AVAPRO) 300 MG tablet TAKE 1 TABLET(300 MG) BY MOUTH DAILY 90 tablet 2  . metFORMIN (GLUCOPHAGE) 500 MG tablet Take 1 tablet (500 mg total) by mouth daily with breakfast. 90 tablet 0  . SYNTHROID 88 MCG tablet TAKE 1 TABLET BY MOUTH EVERY DAY MONDAY TO SATURDAY AND OFF ON SUNDAYS 90 tablet 0  . tacrolimus (PROTOPIC) 0.1 % ointment Apply topically daily. 30 g 6  . TRUE METRIX BLOOD GLUCOSE TEST test strip Use as directed to check blood sugars 1 time per day dx: e11.22 150 each 2  . TRUEplus Lancets 33G MISC Use as directed to check blood sugars 1 time per day dx: e11.22 200 each 2   No current facility-administered medications for this visit.    PHYSICAL EXAMINATION: ECOG PERFORMANCE STATUS: 1 - Symptomatic but completely ambulatory  There were no vitals filed for this visit.  There were no vitals filed for this visit. .There is no height or weight on file to calculate BMI.   GENERAL:alert, in no acute distress and comfortable SKIN: no acute rashes, no significant lesions EYES: conjunctiva are pink and non-injected, sclera anicteric OROPHARYNX: MMM, no exudates, no oropharyngeal erythema or ulceration NECK: supple, no JVD LYMPH:  no palpable lymphadenopathy in the cervical, axillary or inguinal regions LUNGS: clear to auscultation b/l with normal respiratory effort HEART: regular rate & rhythm ABDOMEN:  normoactive bowel sounds , non tender, not distended. No palpable hepatosplenomegaly.  Extremity: no pedal edema PSYCH: alert & oriented x 3 with fluent speech NEURO: no focal motor/sensory  deficits  LABORATORY DATA:   I have reviewed the data as listed  . CBC Latest Ref Rng & Units 06/19/2020 05/17/2020 04/16/2020  WBC 3.4 - 10.8 x10E3/uL 6.1 4.9 5.6  Hemoglobin 11.1 - 15.9 g/dL 11.1 10.5(L) 11.3(L)  Hematocrit 34.0 - 46.6 % 33.7(L) 31.6(L) 34.8(L)  Platelets 150 - 450 x10E3/uL 200 167 180    . CMP Latest Ref Rng & Units 06/19/2020 05/17/2020 04/16/2020  Glucose 65 - 99 mg/dL 84 111(H) 82  BUN 8 - 27 mg/dL _0 Creatinine 0.57 - 1.00 mg/dL 1.29(H) 1.23(H) 1.24(H)  Sodium 134 - 144 mmol/L 140 138 140  Potassium 3.5 - 5.2 mmol/L 4.1 4.2 4.8  Chloride 96 - 106 mmol/L 100 102 105  CO2 20 - 29 mmol/L _1 Calcium 8.7 - 10.3 mg/dL 10.0 9.5 9.5  Total Protein  6.0 - 8.5 g/dL 7.2 6.2 6.9  Total Bilirubin 0.0 - 1.2 mg/dL 0.7 0.6 0.5  Alkaline Phos 44 - 121 IU/L 53 - -  AST 0 - 40 IU/L _0 ALT 0 - 32 IU/L _1 .   Lab Results  Component Value Date   FERRITIN 275 (H) 06/19/2020   . Lab Results  Component Value Date   IRON 75 06/19/2020   TIBC 246 (L) 06/19/2020   IRONPCTSAT 30 06/19/2020   (Iron and TIBC)  Lab Results  Component Value Date   FERRITIN 275 (H) 06/19/2020          RADIOGRAPHIC STUDIES: I have personally reviewed the radiological images as listed and agreed with the findings in the report. No results found. METASTATIC BONE SURVEY 0/02/2015  COMPARISON: None.  FINDINGS: Heart is normal size. Right diaphragmatic hernia again noted, unchanged. Lungs are clear. No effusions.  No focal lytic lesions within the visualized bony structures or acute bony abnormality. Degenerative changes within the shoulders, lower cervical spine, mid to lower thoracic spine, and lower lumbar spine. Advanced degenerative facet disease in the lower lumbar spine. 9 mm of anterolisthesis of L4 on L5. Endplate sclerosis within L4 and L5. Mild degenerative changes in the hips. Advanced degenerative changes within the knees. Sclerosis around  both SI joints compatible with sacroiliitis.  IMPRESSION: No focal lytic lesion.  Degenerative joint disease involving multiple joints as described above.  Grade 2 anterolisthesis of L4 on L5 related to facet disease.   ASSESSMENT & PLAN:   74 year old of an American female with  #1 IgG kappa monoclonal gammopathy of undetermined significance. Bone survey X-ray showed no lytic lesions. M spike is 0.6 g/dL in 10/016 and then 0.5g/dl in 10/2015. SPEP in May/2017 and 07/2016 showed M proteinwas stable at 0.3g/dl  MGUS likely related to underlying connective tissue disorder.  Bone marrow biopsy does show about 13% plasma cells however these appear to be polyclonal and likely related to a possible underlying inflammatory disorder. Less likely a biclonal plasma cell dyscrasia.  #2 Normocytic normochromic anemia likely related to chronic inflammation from recent diagnosis of lupus/rheumatoid arthritis + CKD.  Increased acanthocytes on peripheral blood smear -no overt evidence of liver disease. Could be a marker of some MDS Bone marrow biopsy did not show overt plasma cell dyscrasia or myelodysplastic syndrome. 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 Bone marrow biopsy showed decreased iron stores. Ferritin and iron have been trending down, she has not noticed any issues with bruising or bleeding recently. ?absorption issue. I have given her the option of IV iron which she has previously had without issue. She would like to have this.   PLAN: -Discussed pt labwork today, 06/27/20; no anemia, blood chemistries are steady, Sat Ratios are okay, Ferritin at goal - Goal Ferritin >100 -No lab or clinical evidence of MGUS progression at this time.  -Recommend pt continue 150 mg Iron Polysaccharide three days per week (M,W,F) -Recommend pt f/u with Dr. Estanislado Pandy for management of her autoimmune condition. -Recommend pt  continue f/u with Dermatology for Vitiligo -Will see back in 12 months with labs  FOLLOW UP: RTC with Dr Irene Limbo in 12 months with labs   The total time spent in the appt was 20 minutes and more than 50% was on counseling and direct patient cares.  All of the patient's questions were answered with apparent  satisfaction. The patient knows to call the clinic with any problems, questions or concerns.   Sullivan Lone MD [place] AAHIVMS [facility] Hematology/Oncology Physician [facility]  (Office):       [phone] (Work cell):  [phone] (Fax):           [phone]  I, Yevette Edwards, am acting as a scribe for Dr. Sullivan Lone.   .I have reviewed the above documentation for accuracy and completeness, and I agree with the above. Brunetta Genera MD

## 2020-06-28 ENCOUNTER — Other Ambulatory Visit: Payer: Self-pay

## 2020-06-28 ENCOUNTER — Telehealth: Payer: Self-pay

## 2020-06-28 ENCOUNTER — Other Ambulatory Visit: Payer: Self-pay | Admitting: Physician Assistant

## 2020-06-28 DIAGNOSIS — E1122 Type 2 diabetes mellitus with diabetic chronic kidney disease: Secondary | ICD-10-CM

## 2020-06-28 DIAGNOSIS — M0579 Rheumatoid arthritis with rheumatoid factor of multiple sites without organ or systems involvement: Secondary | ICD-10-CM

## 2020-06-28 DIAGNOSIS — N183 Chronic kidney disease, stage 3 unspecified: Secondary | ICD-10-CM

## 2020-06-28 MED ORDER — ALCOHOL PADS 70 % PADS: MEDICATED_PAD | 2 refills | Status: DC

## 2020-06-28 MED ORDER — TRUE METRIX LEVEL 1 LOW VI SOLN: 1 refills | Status: DC

## 2020-06-28 NOTE — Telephone Encounter (Signed)
Patient left a voicemail stating she received a call from the pharmacy that Dr. Estanislado Pandy denied her prescription refill of Hydroxychloroquine.  Patient is requesting a return call.

## 2020-06-28 NOTE — Telephone Encounter (Signed)
Last Visit: 04/16/2020 Next Visit: 08/14/2020 Labs: 06/19/2020 Creat. 1.29, GFR 47, BUN/Creat. Ratio 9, RBC 3.54, Hct 33.7 PLQ eye exam: 08/17/2019 WNL   Current Dose per office note on 04/16/2020: Plaquenil 200 mg 1 tablet by mouth daily Dx: Rheumatoid arthritis involving multiple sites with positive rheumatoid factor   Okay to refill PLQ?

## 2020-06-28 NOTE — Telephone Encounter (Signed)
Patient advised her prescription was not denied. Patient advised her prescription was sent to the pharmacy.

## 2020-07-08 ENCOUNTER — Ambulatory Visit: Payer: Self-pay

## 2020-07-08 ENCOUNTER — Other Ambulatory Visit: Payer: Self-pay

## 2020-07-08 ENCOUNTER — Telehealth: Payer: Medicare HMO

## 2020-07-08 DIAGNOSIS — D508 Other iron deficiency anemias: Secondary | ICD-10-CM

## 2020-07-08 DIAGNOSIS — E039 Hypothyroidism, unspecified: Secondary | ICD-10-CM

## 2020-07-08 DIAGNOSIS — E1122 Type 2 diabetes mellitus with diabetic chronic kidney disease: Secondary | ICD-10-CM

## 2020-07-08 DIAGNOSIS — E6609 Other obesity due to excess calories: Secondary | ICD-10-CM

## 2020-07-08 DIAGNOSIS — M0579 Rheumatoid arthritis with rheumatoid factor of multiple sites without organ or systems involvement: Secondary | ICD-10-CM

## 2020-07-08 DIAGNOSIS — I129 Hypertensive chronic kidney disease with stage 1 through stage 4 chronic kidney disease, or unspecified chronic kidney disease: Secondary | ICD-10-CM

## 2020-07-08 DIAGNOSIS — N183 Chronic kidney disease, stage 3 unspecified: Secondary | ICD-10-CM

## 2020-07-09 NOTE — Chronic Care Management (AMB) (Signed)
Chronic Care Management   Follow Up Note   07/08/2020 Name: Hannah [name] MRN: [id number] DOB: Aug 19, 1946  Referred by: Glendale Chard, MD Reason for referral : Chronic Care Management (CCM RNCM FU Calll)   Hannah [name] is a 74 y.o. year old female who is a primary care patient of Glendale Chard, MD. The CCM team was consulted for assistance with chronic disease management and care coordination needs.    Review of patient status, including review of consultants reports, relevant laboratory and other test results, and collaboration with appropriate care team members and the patient's provider was performed as part of comprehensive patient evaluation and provision of chronic care management services.    SDOH (Social Determinants of Health) assessments performed: Yes - no acute challenges identified at this time See Care Plan activities for detailed interventions related to [place])   Placed outbound CCM RN CM follow up call to patient for a care plan update.     Outpatient Encounter Medications as of 07/08/2020  Medication Sig  . albuterol (PROVENTIL HFA;VENTOLIN HFA) 108 (90 Base) MCG/ACT inhaler Inhale 2 puffs into the lungs every 6 (six) hours as needed for wheezing or shortness of breath.  . Alcohol Swabs (ALCOHOL PADS) 70 % PADS Use as directed to check blood sugars 1 time per day dx: e11.22  . allopurinol (ZYLOPRIM) 100 MG tablet TAKE 1 TABLET BY MOUTH EVERY DAY FOR GOUT  . amLODipine (NORVASC) 5 MG tablet Take 1 tablet (5 mg total) by mouth daily. 1/2 tab in the am and 1 tab in the pm  . aspirin EC 81 MG tablet Take 81 mg by mouth daily.  [name] atorvastatin (LIPITOR) 80 MG tablet TAKE 1 TABLET BY MOUTH EVERY DAY  . Blood Glucose Calibration (TRUE METRIX LEVEL 1) Low SOLN Use as directed to check blood sugars 1 time per day dx: e11.22  . Blood Glucose Monitoring Suppl (TRUE METRIX METER) w/Device KIT Use as directed to check blood sugars 1 time per day dx: e11.22  .  budesonide-formoterol (SYMBICORT) 160-4.5 MCG/ACT inhaler INHALE 2 PUFFS BY MOUTH TWICE DAILY IN THE MORNING AND EVENING  . Calcium Carb-Cholecalciferol (CALCIUM 600+D) 600-800 MG-UNIT TABS Take 1 tablet by mouth daily.   . Cholecalciferol (VITAMIN D PO) Take 2,000 Units by mouth daily.   [name] denosumab (PROLIA) 60 MG/ML SOSY injection Inject 60 mg into the skin every 6 (six) months.  . hydroxychloroquine (PLAQUENIL) 200 MG tablet TAKE 1 TABLET BY MOUTH DAILY FOR RHEUMATOID ARTHRITIS  . Hypromellose (ARTIFICIAL TEARS OP) Apply to eye as needed.  . irbesartan (AVAPRO) 300 MG tablet TAKE 1 TABLET(300 MG) BY MOUTH DAILY  . metFORMIN (GLUCOPHAGE) 500 MG tablet Take 1 tablet (500 mg total) by mouth daily with breakfast.  . SYNTHROID 88 MCG tablet TAKE 1 TABLET BY MOUTH EVERY DAY MONDAY TO SATURDAY AND OFF ON SUNDAYS  . tacrolimus (PROTOPIC) 0.1 % ointment Apply topically daily.  Hannah [name] METRIX BLOOD GLUCOSE TEST test strip Use as directed to check blood sugars 1 time per day dx: e11.22  . TRUEplus Lancets 33G MISC Use as directed to check blood sugars 1 time per day dx: e11.22   No facility-administered encounter medications on file as of 07/08/2020.     Objective:  Lab Results  Component Value Date   HGBA1C 5.5 06/19/2020   HGBA1C 5.7 (H) 03/20/2020   HGBA1C 5.6 10/31/2019   Lab Results  Component Value Date   MICROALBUR 80 10/31/2019   LDLCALC 53 10/31/2019  CREATININE 1.29 (H) 06/19/2020   BP Readings from Last 3 Encounters:  06/27/20 (!) 144/66  06/19/20 140/72  06/19/20 140/72    Goals Addressed      Patient Stated   .  "To maintain my kidney function" (pt-stated)   On track     Current Barriers:  . Knowledge Deficits related to disease process and Self Health management of CKD . Chronic Disease Management support and education needs related to CKDIII, Hypertensive Neuropathy, DM  Nurse Case Manager Clinical Goal(s):  . New 07/08/20 Over the next 180 days, patient will work  with CCM RN CM to address needs related to disease education and supprt to improve Self Health management of CKD  CCM RN CM Interventions:  07/08/20 call completed with patient . Evaluation of current treatment plan related to CKD and patient's adherence to plan as established by provider. . Reviewed and discussed patient's GFR, BUN and Creatinine; Provided education to patient re: how to maintain kidney function by controlling diabetes and high blood pressure, staying at a healthy weight, staying well hydrated, and eating a healthy diet, implementing exercise into daily routine and adhering to MD follow up to allow for close monitoring of kidney function  . Reviewed and discussed patient's A1c is down to 5.5; Determined patient has also lost 12.5 lbs since joining weight watchers  . Determined patient would like to d/c her Metformin since lowering her A1c . Collaboration with Dr. Sanders regarding patient's request to d/c Metformin and Dr. Sanders has approved . Determined Dr.Webb continues to follow this condition with upcoming appointment scheduled . Determined patient is attending Yoga twice weekly, she is participating at the [employer] twice weekly . Determined patient will continues to Self monitor her BP and reports to Dr. Sanders/Dr. Webb accordingly; Re-Educated on BP target goal, <130/80; Re-educated on importance of drinking plenty of water and staying active  . Mailed printed educational materials related to [employer] for Kidney patients . Discussed plans with patient for ongoing care management follow up and provided patient with direct contact information for care management team . Sent patient message via My Chart to notify of approval to d/c Metformin  Patient Self Care Activities:  . Self administers medications as prescribed . Attends all scheduled provider appointments . Calls pharmacy for medication refills . Performs ADL's independently . Performs IADL's  independently . Calls provider office for new concerns or questions  Please see past updates related to this goal by clicking on the "Past Updates" button in the selected goal        Plan:   Telephone follow up appointment with care management team member scheduled for: 09/19/20  Angel Little, RN, BSN, CCM Care Management Coordinator [facility]  Direct Phone: [phone]    

## 2020-07-09 NOTE — Patient Instructions (Addendum)
Visit Information  Goals Addressed      Patient Stated   .  "To maintain my kidney function" (pt-stated)   On track     Current Barriers:  [name] Knowledge Deficits related to disease process and Self Health management of CKD . Chronic Disease Management support and education needs related to CKDIII, Hypertensive Neuropathy, DM  Nurse Case Manager Clinical Goal(s):  . New 07/08/20 Over the next 180 days, patient will work with CCM RN CM to address needs related to disease education and supprt to improve Self Health management of CKD  CCM RN CM Interventions:  07/08/20 call completed with patient . Evaluation of current treatment plan related to CKD and patient's adherence to plan as established by provider. . Reviewed and discussed patient's GFR, BUN and Creatinine; Provided education to patient re: how to maintain kidney function by controlling diabetes and high blood pressure, staying at a healthy weight, staying well hydrated, and eating a healthy diet, implementing exercise into daily routine and adhering to MD follow up to allow for close monitoring of kidney function  . Reviewed and discussed patient's A1c is down to 5.5; Determined patient has also lost 12.5 lbs since joining weight watchers  . Determined patient would like to d/c her Metformin since lowering her A1c . Collaboration with Dr. Baird Cancer regarding patient's request to d/c Metformin and Dr. Baird Cancer has approved . Determined Dr.Webb continues to follow this condition with upcoming appointment scheduled . Determined patient is attending Yoga twice weekly, she is participating at the [facility] twice weekly . Determined patient will continues to Self monitor her BP and reports to Dr. Harrel Lemon. Justin Mend accordingly; Re-Educated on BP target goal, <130/80; Re-educated on importance of drinking plenty of water and staying active  . Mailed printed educational materials related to [employer] for Kidney patients . Discussed plans with  patient for ongoing care management follow up and provided patient with direct contact information for care management team . Sent patient message via My Chart to notify of approval to d/c Metformin  Patient Self Care Activities:  . Self administers medications as prescribed . Attends all scheduled provider appointments . Calls pharmacy for medication refills . Performs ADL's independently . Performs IADL's independently . Calls provider office for new concerns or questions  Please see past updates related to this goal by clicking on the "Past Updates" button in the selected goal         Patient verbalizes understanding of instructions provided today.   Telephone follow up appointment with care management team member scheduled for: 09/19/20  Barb Merino, RN, BSN, CCM Care Management Coordinator [place] [facility]  Direct Phone: [phone]

## 2020-07-15 ENCOUNTER — Telehealth: Payer: Self-pay

## 2020-07-15 NOTE — Telephone Encounter (Signed)
The pt was notified that her synthroid has come from the [place] patient assistance program.  The pt wants to know if she should keep taking an 81 mg of aspirin daily.

## 2020-07-25 DIAGNOSIS — M9903 Segmental and somatic dysfunction of lumbar region: Secondary | ICD-10-CM | POA: Diagnosis not present

## 2020-07-25 DIAGNOSIS — M5136 Other intervertebral disc degeneration, lumbar region: Secondary | ICD-10-CM | POA: Diagnosis not present

## 2020-07-25 DIAGNOSIS — M9905 Segmental and somatic dysfunction of pelvic region: Secondary | ICD-10-CM | POA: Diagnosis not present

## 2020-07-25 DIAGNOSIS — M47814 Spondylosis without myelopathy or radiculopathy, thoracic region: Secondary | ICD-10-CM | POA: Diagnosis not present

## 2020-07-25 DIAGNOSIS — M9902 Segmental and somatic dysfunction of thoracic region: Secondary | ICD-10-CM | POA: Diagnosis not present

## 2020-07-25 DIAGNOSIS — M5137 Other intervertebral disc degeneration, lumbosacral region: Secondary | ICD-10-CM | POA: Diagnosis not present

## 2020-07-29 ENCOUNTER — Other Ambulatory Visit: Payer: Self-pay | Admitting: Internal Medicine

## 2020-07-29 ENCOUNTER — Ambulatory Visit: Payer: Self-pay

## 2020-07-29 ENCOUNTER — Telehealth: Payer: Medicare HMO

## 2020-07-29 ENCOUNTER — Other Ambulatory Visit: Payer: Self-pay

## 2020-07-29 DIAGNOSIS — L819 Disorder of pigmentation, unspecified: Secondary | ICD-10-CM | POA: Diagnosis not present

## 2020-07-29 DIAGNOSIS — L818 Other specified disorders of pigmentation: Secondary | ICD-10-CM | POA: Diagnosis not present

## 2020-07-29 DIAGNOSIS — N183 Chronic kidney disease, stage 3 unspecified: Secondary | ICD-10-CM

## 2020-07-29 DIAGNOSIS — I129 Hypertensive chronic kidney disease with stage 1 through stage 4 chronic kidney disease, or unspecified chronic kidney disease: Secondary | ICD-10-CM

## 2020-07-29 DIAGNOSIS — E2839 Other primary ovarian failure: Secondary | ICD-10-CM

## 2020-07-29 DIAGNOSIS — L80 Vitiligo: Secondary | ICD-10-CM | POA: Diagnosis not present

## 2020-07-29 DIAGNOSIS — E1122 Type 2 diabetes mellitus with diabetic chronic kidney disease: Secondary | ICD-10-CM

## 2020-07-29 DIAGNOSIS — L821 Other seborrheic keratosis: Secondary | ICD-10-CM | POA: Diagnosis not present

## 2020-07-30 NOTE — Chronic Care Management (AMB) (Signed)
Chronic Care Management   Follow Up Note   07/29/2020 Name: Hannah [name] MRN: [id number] DOB: 08/03/1946  Referred by: Sanders, Robyn, MD Reason for referral : Chronic Care Management (Inbound call from patient )   Hannah [name] is a 74 y.o. year old female who is a primary care patient of Sanders, Robyn, MD. The CCM team was consulted for assistance with chronic disease management and care coordination needs.    Review of patient status, including review of consultants reports, relevant laboratory and other test results, and collaboration with appropriate care team members and the patient's provider was performed as part of comprehensive patient evaluation and provision of chronic care management services.    SDOH (Social Determinants of Health) assessments performed: No See Care Plan activities for detailed interventions related to SDOH)   Inbound call completed with patient regarding her request for a referral to [facility] to have her 2 yr Dexa Scan completed.     Outpatient Encounter Medications as of 07/29/2020  Medication Sig  . albuterol (PROVENTIL HFA;VENTOLIN HFA) 108 (90 Base) MCG/ACT inhaler Inhale 2 puffs into the lungs every 6 (six) hours as needed for wheezing or shortness of breath.  . Alcohol Swabs (ALCOHOL PADS) 70 % PADS Use as directed to check blood sugars 1 time per day dx: e11.22  . allopurinol (ZYLOPRIM) 100 MG tablet TAKE 1 TABLET BY MOUTH EVERY DAY FOR GOUT  . amLODipine (NORVASC) 5 MG tablet Take 1 tablet (5 mg total) by mouth daily. 1/2 tab in the am and 1 tab in the pm  . aspirin EC 81 MG tablet Take 81 mg by mouth daily.  . atorvastatin (LIPITOR) 80 MG tablet TAKE 1 TABLET BY MOUTH EVERY DAY  . Blood Glucose Calibration (TRUE METRIX LEVEL 1) Low SOLN Use as directed to check blood sugars 1 time per day dx: e11.22  . Blood Glucose Monitoring Suppl (TRUE METRIX METER) w/Device KIT Use as directed to check blood sugars 1 time per day dx: e11.22    . budesonide-formoterol (SYMBICORT) 160-4.5 MCG/ACT inhaler INHALE 2 PUFFS BY MOUTH TWICE DAILY IN THE MORNING AND EVENING  . Calcium Carb-Cholecalciferol (CALCIUM 600+D) 600-800 MG-UNIT TABS Take 1 tablet by mouth daily.   . Cholecalciferol (VITAMIN D PO) Take 2,000 Units by mouth daily.   . denosumab (PROLIA) 60 MG/ML SOSY injection Inject 60 mg into the skin every 6 (six) months.  . hydroxychloroquine (PLAQUENIL) 200 MG tablet TAKE 1 TABLET BY MOUTH DAILY FOR RHEUMATOID ARTHRITIS  . Hypromellose (ARTIFICIAL TEARS OP) Apply to eye as needed.  . irbesartan (AVAPRO) 300 MG tablet TAKE 1 TABLET(300 MG) BY MOUTH DAILY  . metFORMIN (GLUCOPHAGE) 500 MG tablet Take 1 tablet (500 mg total) by mouth daily with breakfast.  . SYNTHROID 88 MCG tablet TAKE 1 TABLET BY MOUTH EVERY DAY MONDAY TO SATURDAY AND OFF ON SUNDAYS  . tacrolimus (PROTOPIC) 0.1 % ointment Apply topically daily.  . TRUE METRIX BLOOD GLUCOSE TEST test strip Use as directed to check blood sugars 1 time per day dx: e11.22  . TRUEplus Lancets 33G MISC Use as directed to check blood sugars 1 time per day dx: e11.22   No facility-administered encounter medications on file as of 07/29/2020.     Objective:  Lab Results  Component Value Date   HGBA1C 5.5 06/19/2020   HGBA1C 5.7 (H) 03/20/2020   HGBA1C 5.6 10/31/2019   Lab Results  Component Value Date   MICROALBUR 80 10/31/2019   LDLCALC 53 10/31/2019     CREATININE 1.29 (H) 06/19/2020   BP Readings from Last 3 Encounters:  06/27/20 (!) 144/66  06/19/20 140/72  06/19/20 140/72    Goals Addressed      Patient Stated   .  "I need to have my bone density scan completed" (pt-stated)        CARE PLAN ENTRY (see longitudinal plan of care for additional care plan information)  Current Barriers:  [name] Knowledge Deficits related to evaluation and treatment of Osteoporosis . Chronic Disease Management support and education needs related to CKD III, hypertensive nephropathy, DM  Nurse  Case Manager Clinical Goal(s):  [name] Over the next 30 days, patient will verbalize understanding of plan for follow up Dexa Scan to evaluate Osteoporosis  Interventions:  07/29/20 completed Inbound call from patient  . Inter-disciplinary care team collaboration (see longitudinal plan of care) . Evaluation of current treatment plan related to Osteoporosis and patient's adherence to plan as established by provider . Inbound call received from patient stating she is due for her Dexa Scan to evaluate her Osteoporosis . Determined patient contacted [facility] in [place] and was advised her PCP will need to submit a referral for this scan to be scheduled/completed . Collaborated with PCP Dr. Baird Cancer via secure chat regarding patient's request to have a referral sent for her 2 year Dexa Scan to [facility] in [place] . Discussed patient will check back with [facility] later in the week to confirm the referral was received  . Discussed plans with patient for ongoing care management follow up and provided patient with direct contact information for care management team  Patient Self Care Activities:  . Self administers medications as prescribed . Attends all scheduled provider appointments . Calls pharmacy for medication refills . Calls provider office for new concerns or questions  Initial goal documentation       Plan:   Telephone follow up appointment with care management team member scheduled for: 09/19/20  Barb Merino, RN, BSN, CCM Care Management Coordinator [place] [facility]  Direct Phone: [phone]

## 2020-07-30 NOTE — Patient Instructions (Signed)
Visit Information  Goals Addressed              This Visit's Progress     Patient Stated   .  "I need to have my bone density scan completed" (pt-stated)        CARE PLAN ENTRY (see longitudinal plan of care for additional care plan information)  Current Barriers:  [name] Knowledge Deficits related to evaluation and treatment of Osteoporosis . Chronic Disease Management support and education needs related to CKD III, hypertensive nephropathy, DM  Nurse Case Manager Clinical Goal(s):  [name] Over the next 30 days, patient will verbalize understanding of plan for follow up Dexa Scan to evaluate Osteoporosis  Interventions:  07/29/20 completed Inbound call from patient  . Inter-disciplinary care team collaboration (see longitudinal plan of care) . Evaluation of current treatment plan related to Osteoporosis and patient's adherence to plan as established by provider . Inbound call received from patient stating she is due for her Dexa Scan to evaluate her Osteoporosis . Determined patient contacted [facility] in [place] and was advised her PCP will need to submit a referral for this scan to be scheduled/completed . Collaborated with PCP Dr. Baird Cancer via secure chat regarding patient's request to have a referral sent for her 2 year Dexa Scan to [facility] in [place] . Discussed patient will check back with [facility] later in the week to confirm the referral was received  . Discussed plans with patient for ongoing care management follow up and provided patient with direct contact information for care management team  Patient Self Care Activities:  . Self administers medications as prescribed . Attends all scheduled provider appointments . Calls pharmacy for medication refills . Calls provider office for new concerns or questions  Initial goal documentation       Patient verbalizes understanding of instructions provided today.   Telephone follow up appointment with care  management team member scheduled for: 09/19/20  Barb Merino, RN, BSN, CCM Care Management Coordinator [place] [facility]  Direct Phone: [phone]

## 2020-07-31 NOTE — Chronic Care Management (AMB) (Incomplete)
Chronic Care Management Pharmacy  Name: Hannah [name]  MRN: [id number] DOB: Aug 12, 1946   Chief Complaint/ HPI  Hannah [name],  74 y.o. , female presents for their Follow-Up CCM visit with the clinical pharmacist via telephone due to COVID-19 Pandemic.  PCP : Glendale Chard, MD Patient Care Team: Glendale Chard, MD as PCP - General (Internal Medicine) Nahser, Wonda Cheng, MD as PCP - Cardiology (Cardiology) Rex Kras Claudette Stapler, RN as [place], [facility] (Inactive) (Pharmacist)  Their chronic conditions include: Hyperlipidemia   Office Visits: ***  Consult Visit: ***  Allergies  Allergen Reactions  . Shellfish Allergy Anaphylaxis and Hives    Medications: Outpatient Encounter Medications as of 08/01/2020  Medication Sig  . albuterol (PROVENTIL HFA;VENTOLIN HFA) 108 (90 Base) MCG/ACT inhaler Inhale 2 puffs into the lungs every 6 (six) hours as needed for wheezing or shortness of breath.  . Alcohol Swabs (ALCOHOL PADS) 70 % PADS Use as directed to check blood sugars 1 time per day dx: e11.22  . allopurinol (ZYLOPRIM) 100 MG tablet TAKE 1 TABLET BY MOUTH EVERY DAY FOR GOUT  . amLODipine (NORVASC) 5 MG tablet Take 1 tablet (5 mg total) by mouth daily. 1/2 tab in the am and 1 tab in the pm  . aspirin EC 81 MG tablet Take 81 mg by mouth daily.  [name] atorvastatin (LIPITOR) 80 MG tablet TAKE 1 TABLET BY MOUTH EVERY DAY  . Blood Glucose Calibration (TRUE METRIX LEVEL 1) Low SOLN Use as directed to check blood sugars 1 time per day dx: e11.22  . Blood Glucose Monitoring Suppl (TRUE METRIX METER) w/Device KIT Use as directed to check blood sugars 1 time per day dx: e11.22  . budesonide-formoterol (SYMBICORT) 160-4.5 MCG/ACT inhaler INHALE 2 PUFFS BY MOUTH TWICE DAILY IN THE MORNING AND EVENING  . Calcium Carb-Cholecalciferol (CALCIUM 600+D) 600-800 MG-UNIT TABS Take 1 tablet by mouth daily.   . Cholecalciferol (VITAMIN D PO) Take 2,000  Units by mouth daily.   [name] denosumab (PROLIA) 60 MG/ML SOSY injection Inject 60 mg into the skin every 6 (six) months.  . hydroxychloroquine (PLAQUENIL) 200 MG tablet TAKE 1 TABLET BY MOUTH DAILY FOR RHEUMATOID ARTHRITIS  . Hypromellose (ARTIFICIAL TEARS OP) Apply to eye as needed.  . irbesartan (AVAPRO) 300 MG tablet TAKE 1 TABLET(300 MG) BY MOUTH DAILY  . metFORMIN (GLUCOPHAGE) 500 MG tablet Take 1 tablet (500 mg total) by mouth daily with breakfast.  . SYNTHROID 88 MCG tablet TAKE 1 TABLET BY MOUTH EVERY DAY MONDAY TO SATURDAY AND OFF ON SUNDAYS  . tacrolimus (PROTOPIC) 0.1 % ointment Apply topically daily.  [name] METRIX BLOOD GLUCOSE TEST test strip Use as directed to check blood sugars 1 time per day dx: e11.22  . TRUEplus Lancets 33G MISC Use as directed to check blood sugars 1 time per day dx: e11.22   No facility-administered encounter medications on file as of 08/01/2020.    Wt Readings from Last 3 Encounters:  06/27/20 152 lb 14.4 oz (69.4 kg)  06/19/20 154 lb (69.9 kg)  06/19/20 154 lb 3.2 oz (69.9 kg)    Current Diagnosis/Assessment:    Goals Addressed   None     Hypertension   BP goal is:  {CHL HP UPSTREAM Pharmacist BP [phone]}  Office blood pressures are  BP Readings from Last 3 Encounters:  06/27/20 (!) 144/66  06/19/20 140/72  06/19/20 140/72   Patient checks BP at home {CHL HP BP Monitoring Frequency:[phone]}  Patient home BP readings are ranging: ***  Patient has failed these meds in the past: *** Patient is currently {CHL Controlled/Uncontrolled:6673113191} on the following medications:  .   We discussed {CHL HP Upstream Pharmacy discussion:8733767604}  Plan  Continue {CHL HP Upstream Pharmacy Plans:[phone]}   Diabetes   A1c goal {A1c goals:23924}  Recent Relevant Labs: Lab Results  Component Value Date/Time   HGBA1C 5.5 06/19/2020 02:29 PM   HGBA1C 5.7 (H) 03/20/2020 04:45 PM   MICROALBUR 80 10/31/2019 12:13 PM    MICROALBUR 80 06/15/2019 06:11 PM    Last diabetic Eye exam:  Lab Results  Component Value Date/Time   HMDIABEYEEXA No Retinopathy 08/17/2019 08:58 AM    Last diabetic Foot exam: No results found for: HMDIABFOOTEX   Checking BG: {CHL HP Blood Glucose Monitoring Frequency:[phone]}  Recent FBG Readings: *** Recent pre-meal BG readings: *** Recent 2hr PP BG readings:  *** Recent HS BG readings: ***  Patient has failed these meds in past: *** Patient is currently {CHL Controlled/Uncontrolled:6673113191} on the following medications: . ***  We discussed: {CHL HP Upstream Pharmacy discussion:8733767604}  Plan  Continue {CHL HP Upstream Pharmacy Plans:[phone]}  Hyperlipidemia   LDL goal < ***  Last lipids Lab Results  Component Value Date   CHOL 134 10/31/2019   HDL 67 10/31/2019   LDLCALC 53 10/31/2019   TRIG 70 10/31/2019   CHOLHDL 2.0 10/31/2019   Hepatic Function Latest Ref Rng & Units 06/19/2020 05/17/2020 04/16/2020  Total Protein 6.0 - 8.5 g/dL 7.2 6.2 6.9  Albumin 3.7 - 4.7 g/dL 4.5 - -  AST 0 - 40 IU/L _0 ALT 0 - 32 IU/L _1 Alk Phosphatase 44 - 121 IU/L 53 - -  Total Bilirubin 0.0 - 1.2 mg/dL 0.7 0.6 0.5     The 10-year ASCVD risk score Mikey Bussing DC Jr., et al., 2013) is: 28.5%   Values used to calculate the score:     Age: 11 years     Sex: Female     Is Non-Hispanic African American: Yes     Diabetic: Yes     Tobacco smoker: No     Systolic Blood Pressure: 782 mmHg     Is BP treated: Yes     HDL Cholesterol: 67 mg/dL     Total Cholesterol: 134 mg/dL   Patient has failed these meds in past: *** Patient is currently {CHL Controlled/Uncontrolled:6673113191} on the following medications:  . ***  We discussed:  {CHL HP Upstream Pharmacy discussion:8733767604}  Plan  Continue {CHL HP Upstream Pharmacy Plans:[phone]}  Osteopenia / Osteoporosis   Last DEXA Scan: ***   T-Score femoral neck: ***  T-Score total hip: ***  T-Score  lumbar spine: ***  T-Score forearm radius: ***  10-year probability of major osteoporotic fracture: ***  10-year probability of hip fracture: ***  Last vitamin D/Calcium Lab Results  Component Value Date   VD25OH 41 11/14/2019   CALCIUM 10.0 06/19/2020    Patient {is;is not an osteoporosis candidate:23886}  Patient has failed these meds in past: *** Patient is currently {CHL Controlled/Uncontrolled:6673113191} on the following medications:  . ***  We discussed:  {Osteoporosis Counseling:23892}  Plan  Continue {CHL HP Upstream Pharmacy NFAOZ:[id number]}       Medication Management   Pt uses *** pharmacy for all medications Uses pill box? {Yes or If no, why not?:20788} Pt endorses ***% compliance  We discussed: {Pharmacy options:24294}  Plan  {US Pharmacy GEXB:28413}    Follow up: ***  month phone visit  ***

## 2020-07-31 NOTE — Progress Notes (Signed)
Office Visit Note  Patient: Hannah [name]             Date of Birth: 1946-03-11           MRN: [id number]             PCP: Glendale Chard, MD Referring: Glendale Chard, MD Visit Date: 08/14/2020 Occupation: @GUAROCC @  Subjective:  Medication monitoring (patient reports recent cortisone injections in bilateral knees by ortho )   History of Present Illness: Hannah [name] is a 74 y.o. female with history of systemic lupus, rheumatoid arthritis, gout and osteoarthritis.  She states she was having increased pain and discomfort in her knee joints.  She was recently seen by Dr. Maureen Ralphs had bilateral knee joint cortisone injections.  He also discussed physical supplement injections with her in the future.  She denies any joint pain or joint swelling.  She has been getting Prolia injections every 6 months which she has been tolerating well.  Has not had a gout flare in a long time.  Her last uric acid in July was 4.2.  Activities of Daily Living:  Patient reports morning stiffness for 15-20  minutes.   Patient Denies nocturnal pain.  Difficulty dressing/grooming: Denies Difficulty climbing stairs: Reports Difficulty getting out of chair: Reports Difficulty using hands for taps, buttons, cutlery, and/or writing: Denies  Review of Systems  Constitutional: Positive for fatigue.  HENT: Negative for mouth sores, mouth dryness and nose dryness.   Eyes: Positive for dryness. Negative for pain and itching.  Respiratory: Negative for shortness of breath and difficulty breathing.   Cardiovascular: Negative for chest pain and palpitations.  Gastrointestinal: Negative for blood in stool, constipation and diarrhea.  Endocrine: Negative for increased urination.  Genitourinary: Negative for difficulty urinating.  Musculoskeletal: Positive for arthralgias, joint pain, joint swelling and morning stiffness. Negative for myalgias, muscle tenderness and myalgias.  Skin: Negative for color change,  rash and redness.  Allergic/Immunologic: Negative for susceptible to infections.  Neurological: Negative for dizziness, numbness, headaches, memory loss and weakness.  Hematological: Negative for bruising/bleeding tendency.  Psychiatric/Behavioral: Negative for confusion.    PMFS History:  Patient Active Problem List   Diagnosis Date Noted  . PVC (premature ventricular contraction) 05/06/2020  . Type 2 diabetes mellitus with stage 3 chronic kidney disease, without long-term current use of insulin ([place]) 03/20/2020  . Palpitations 03/20/2020  . 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/2020  . Primary hypothyroidism 03/20/2020  . Vitiligo 03/20/2020  . Iron deficiency anemia 08/12/2017  . Osteoporosis 10/02/2016  . Systemic lupus erythematosus ([place]) 10/02/2016  . Rheumatoid arthritis involving multiple sites with positive rheumatoid factor ([place]) 10/02/2016  . High risk medication use 10/02/2016  . History of chronic kidney disease 10/02/2016  . 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/2016  . Primary osteoarthritis of both knees 10/02/2016  . Vitamin D deficiency 10/02/2016  . History of diabetes mellitus 10/02/2016  . History of hypertension 10/02/2016  . History of asthma 10/02/2016  . Absolute anemia   . MGUS (monoclonal gammopathy of unknown significance)   . Normocytic anemia 03/21/2015  . Essential hypertension 03/20/2015  . Abnormal CT of the chest 07/30/2012  . Asthma 06/19/2012    Past Medical History:  Diagnosis Date  . Anemia   . Arthritis   . Asthma   . Diabetes mellitus without complication ([place])   . Gout 12/17/2014   patient reported  . Hyperlipidemia   . Hypertension   . Systemic lupus erythematosus ([place])  Family History  Problem Relation Age of Onset  . Heart disease Father   . Diabetes Father   . Hypertension Mother   . Hypertension Sister   . Hypertension Sister   . Leukemia Sister    Past Surgical History:  Procedure Laterality  Date  . CATARACT EXTRACTION Bilateral 2015  . DILATION AND CURETTAGE OF UTERUS    . DOPPLER ECHOCARDIOGRAPHY  05/2018   Internist to review with pt; potential heart murmur 06/20/18  . keratosis removal  2021  . SKIN SURGERY  11/30/2018   left side of face  . TOOTH EXTRACTION     Social History   Social History Narrative  . Not on file   Immunization History  Administered Date(s) Administered  . 19-influenza Whole 07/15/2012  . DTaP 06/18/2019  . Fluad Quad(high Dose 65+) 06/19/2020  . Influenza Split 06/28/2014  . Influenza, High Dose Seasonal PF 05/18/2019  . Influenza,inj,Quad PF,6+ Mos 06/20/2018  . Moderna SARS-COVID-2 Vaccination 11/20/2019, 12/19/2019, 08/06/2020  . Pneumococcal Conjugate-13 05/04/2018  . Pneumococcal Polysaccharide-23 05/18/2019  . Pneumococcal-Unspecified 06/28/2014  . Tdap 06/15/2019     Objective: Vital Signs: BP 138/78 (BP Location: Left Arm, Patient Position: Sitting, Cuff Size: Normal)   Pulse 62   Resp 14   Ht 4\' 11"  (1.499 m)   Wt 150 lb (68 kg)   BMI 30.30 kg/m    Physical Exam Vitals and nursing note reviewed.  Constitutional:      Appearance: She is well-developed.  HENT:     Head: Normocephalic and atraumatic.  Eyes:     Conjunctiva/sclera: Conjunctivae normal.  Cardiovascular:     Rate and Rhythm: Normal rate and regular rhythm.     Heart sounds: Normal heart sounds.  Pulmonary:     Effort: Pulmonary effort is normal.     Breath sounds: Normal breath sounds.  Abdominal:     General: Bowel sounds are normal.     Palpations: Abdomen is soft.  Musculoskeletal:     Cervical back: Normal range of motion.  Lymphadenopathy:     Cervical: No cervical adenopathy.  Skin:    General: Skin is warm and dry.     Capillary Refill: Capillary refill takes less than 2 seconds.  Neurological:     Mental Status: She is alert and oriented to person, place, and time.  Psychiatric:        Behavior: Behavior normal.       Musculoskeletal Exam: C-spine was in good range of motion.  She has thoracic kyphosis.  Shoulder joints, elbow joints, wrist joints with good range of motion.  She has ulnar deviation with no synovitis.  She has some PIP and DIP thickening.  Hip joints with good range of motion.  She has warmth on palpation of her right knee joint.  She has limited extension of her right knee joint.  There was no tenderness over ankle joints or MTPs.  CDAI Exam: CDAI Score: 1.4  Patient Global: 2 mm; Provider Global: 2 mm Swollen: 0 ; Tender: 1  Joint Exam 08/14/2020      Right  Left  Knee   Tender        Investigation: No additional findings.  Imaging: No results found.  Recent Labs: Lab Results  Component Value Date   WBC 6.1 06/19/2020   HGB 11.1 06/19/2020   PLT 200 06/19/2020   NA 140 06/19/2020   K 4.1 06/19/2020   CL 100 06/19/2020   CO2 22 06/19/2020   GLUCOSE 84  06/19/2020   BUN 11 06/19/2020   CREATININE 1.29 (H) 06/19/2020   BILITOT 0.7 06/19/2020   ALKPHOS 53 06/19/2020   AST 20 06/19/2020   ALT 14 06/19/2020   PROT 7.2 06/19/2020   ALBUMIN 4.5 06/19/2020   CALCIUM 10.0 06/19/2020   GFRAA 47 (L) 06/19/2020    Speciality Comments: PLQ eye exam: 08/17/2019 WNL @ [facility]  Procedures:  No procedures performed Allergies: Shellfish allergy   Assessment / Plan:     Visit Diagnoses: Rheumatoid arthritis involving multiple sites with positive rheumatoid factor (HCC) - Positive RF, positive anti-CCP.  She is clinically doing well.  She states besides her right knee joint.  She had severe osteoarthritis none of the joints are painful.  Other systemic lupus erythematosus with other organ involvement (HCC) - Positive ANA, positive Smith, positive RNP.  Her labs have been stable.  She has not had any flares of lupus.  We will check labs again today.- Plan: Urinalysis, Routine w reflex microscopic, Anti-DNA antibody, double-stranded, C3 and C4, Sedimentation rate  High  risk medication use - Plaquenil 200 mg 1 tablet by mouth daily.eye exam: 08/17/19  - Plan: CBC with Differential/Platelet, COMPLETE METABOLIC PANEL WITH GFR today and then every 5 months to monitor for drug toxicity and disease process.  Idiopathic chronic gout of multiple sites without tophus - Allopurinol 100 mg 1 tablet by mouth daily. -She denies having any gout flares.  Plan: Uric acid  Primary osteoarthritis of both knees-she has osteoarthritis in her knee joints and is followed by Dr. Maureen Ralphs.  She had recent cortisone injections to her bilateral knee joints.  She is planning to get [place] supplement injections in the future.  Age-related osteoporosis without current pathological fracture - Patient is on Prolia injections every 6 months for osteoporosis. last injection: 06/10/2020.  She will be coming in for the next injection in January.  MGUS (monoclonal gammopathy of unknown significance)-followed by heme-onc.  History of vitamin D deficiency-she is on supplement.  History of chronic kidney disease-her GFR is in 53s.  History of hypertension  History of diabetes mellitus  History of asthma  History of anemia  Orders: Orders Placed This Encounter  Procedures  . CBC with Differential/Platelet  . COMPLETE METABOLIC PANEL WITH GFR  . Urinalysis, Routine w reflex microscopic  . Anti-DNA antibody, double-stranded  . C3 and C4  . Sedimentation rate  . Uric acid   No orders of the defined types were placed in this encounter.     Follow-Up Instructions: Return in about 5 months (around 01/12/2021) for Systemic lupus, Rheumatoid arthritis, Gout, Osteoarthritis.   Bo Merino, MD  Note - This record has been created using Editor, commissioning.  Chart creation errors have been sought, but may not always  have been located. Such creation errors do not reflect on  the standard of medical care.

## 2020-08-01 ENCOUNTER — Ambulatory Visit: Payer: Self-pay | Admitting: Pharmacist

## 2020-08-01 DIAGNOSIS — E1122 Type 2 diabetes mellitus with diabetic chronic kidney disease: Secondary | ICD-10-CM

## 2020-08-01 DIAGNOSIS — I10 Essential (primary) hypertension: Secondary | ICD-10-CM

## 2020-08-01 DIAGNOSIS — E039 Hypothyroidism, unspecified: Secondary | ICD-10-CM

## 2020-08-01 DIAGNOSIS — N183 Chronic kidney disease, stage 3 unspecified: Secondary | ICD-10-CM

## 2020-08-01 NOTE — Chronic Care Management (AMB) (Unsigned)
Chronic Care Management Pharmacy  Name: Hannah [name]  MRN: [id number] DOB: 16-Mar-1946  Chief Complaint/ HPI  Hannah [name],  74 y.o. , female presents for their Follow-Up CCM visit with the clinical pharmacist via telephone due to COVID-19 Pandemic.  PCP : Glendale Chard, MD  Their chronic conditions include: Hypertension, Rheumatoid arthritis, Systemic lupus erythematosus. Hypothyroidism, Type 2 diabetes mellitus, Gout, Anemia, Osteoporosis, Osteoarthritis   Office Visits:  06/19/20 OV: DM and BP check.  Given HD flu shot in office, advised to aim for at least 150 minutes of exercise per week.  04/02/20 Nurse visit: Presented for BP check. 132/64. Continue current medications.   03/26/20 Nurse visit: Presented for BP check. Pt machine 145/78, manual 138/72.  03/20/20 OV: Labs ordered (HgbA1c, TSH, T4 free, Magnesium, ANA IFA). Pt complained of palpitations. No arrhythmias noted on EKG. Referred to Cardiology for further evaluation. HTN uncontrolled, encouraged to keep daily BP log at home.   10/31/19 OV: Presented for full physical exam and diabetes and hypertension follow up. Reported compliance with medications. Complained of eyelid swelling and midback pain. Diabetic foot exam performed. Labs ordered (HgbA1c, CBC, CMP14+EGFR, Lipid panel, UA, UA-microalbumin). EKG performed, no new changes noted. Advised to perform stretches and take magnesium nightly for back pain. Advised pt to use small amount of Preparation H on lower eyelids 2-3 times per week for gland swelling.   Consult Visits: 04/16/20 Rheumatology OV w/ Dr. Estanislado Pandy: Complained of chronic right knee pain. Xray showed severe osteoarthritis and severe chondromalacia patella. Labs ordered (CBC with GFR, Uric acid). Continue current medications.     02/06/20 Dermatology OV w/ K. Sheffield: Biopsy performed for neoplasm and vitiligo. Rx for tacrolimus ointment sent to pharmacy.   12/26/19 Oncology OV w/ Dr. Irene Limbo: Presented  or follow up of MGUS and anemia. Pt reported she began taking iron polysaccharide BID after rheumatology office visit when Hgb was close to 10. Reduce iron polysaccharide back to once daily as Ferritin was 225. Follow up in 6 months.   11/29/19 Rheumatology OV w/ Dr. 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 Pt on Prolia injections every 6 months for osteoporosis. Prolia injected today.   09/05/19 OV Gynecology w/ Dr. Dellis Filbert: Presented for annual gynecological exam in menopause. Pap reflex done. Mammogram 2020 negative. Colonoscopy 2014. No hormone replacement. Followed by Endocrinology for osteoporosis. Recommended low calorie/carb diet and increase in physical activity and light weight lifting.   CCM Encounters: 3/17 21 PharmD: Tier exception for [facility] denied. Samples given for Synthroid 74mg.   12/12/19 RN: Evaluation of current treatment plans. Discussed knee pain and process for orthopedics referral if desired.   11/09/19 RN: Per Dr.Sanders, pt may decrease metformin to 5064monce daily with breakfast. CKD treatment plan evaluated.   11/07/19 RN: Evaluation of current treatment plans. Pt education provided.    09/04/19 PharmD: Tier exception for Synthroid denied. Will call for appeal process.   08/30/19 PharmD: Comprehensive medication review performed. Counseled on use of rescue albuterol and Symbicort. Patient paying $44/month for brand name Synthroid. Pt doesn't wish to participate in Synthroid Delivers due to [place] [facility]  Medications: Outpatient Encounter Medications as of 08/01/2020  Medication Sig  . albuterol (PROVENTIL HFA;VENTOLIN HFA) 108 (90 Base) MCG/ACT inhaler Inhale 2 puffs into the lungs every 6 (six) hours as needed for wheezing or shortness of breath.  . Alcohol Swabs (  ALCOHOL PADS) 70 % PADS Use as directed to check blood sugars 1  time per day dx: e11.22  . allopurinol (ZYLOPRIM) 100 MG tablet TAKE 1 TABLET BY MOUTH EVERY DAY FOR GOUT  . amLODipine (NORVASC) 5 MG tablet Take 1 tablet (5 mg total) by mouth daily. 1/2 tab in the am and 1 tab in the pm  . aspirin EC 81 MG tablet Take 81 mg by mouth daily.  [name] atorvastatin (LIPITOR) 80 MG tablet TAKE 1 TABLET BY MOUTH EVERY DAY  . Blood Glucose Calibration (TRUE METRIX LEVEL 1) Low SOLN Use as directed to check blood sugars 1 time per day dx: e11.22  . Blood Glucose Monitoring Suppl (TRUE METRIX METER) w/Device KIT Use as directed to check blood sugars 1 time per day dx: e11.22  . budesonide-formoterol (SYMBICORT) 160-4.5 MCG/ACT inhaler INHALE 2 PUFFS BY MOUTH TWICE DAILY IN THE MORNING AND EVENING  . Calcium Carb-Cholecalciferol (CALCIUM 600+D) 600-800 MG-UNIT TABS Take 1 tablet by mouth daily.   . Cholecalciferol (VITAMIN D PO) Take 2,000 Units by mouth daily.   [name] denosumab (PROLIA) 60 MG/ML SOSY injection Inject 60 mg into the skin every 6 (six) months.  . hydroxychloroquine (PLAQUENIL) 200 MG tablet TAKE 1 TABLET BY MOUTH DAILY FOR RHEUMATOID ARTHRITIS  . Hypromellose (ARTIFICIAL TEARS OP) Apply to eye as needed.  . irbesartan (AVAPRO) 300 MG tablet TAKE 1 TABLET(300 MG) BY MOUTH DAILY  . metFORMIN (GLUCOPHAGE) 500 MG tablet Take 1 tablet (500 mg total) by mouth daily with breakfast. (Patient not taking: Reported on 08/01/2020)  . SYNTHROID 88 MCG tablet TAKE 1 TABLET BY MOUTH EVERY DAY MONDAY TO SATURDAY AND OFF ON SUNDAYS  . tacrolimus (PROTOPIC) 0.1 % ointment Apply topically daily.  [name] METRIX BLOOD GLUCOSE TEST test strip Use as directed to check blood sugars 1 time per day dx: e11.22  . TRUEplus Lancets 33G MISC Use as directed to check blood sugars 1 time per day dx: e11.22   No facility-administered encounter medications on file as of 08/01/2020.    Current Diagnosis/Assessment:   Goals Addressed            This Visit's Progress   . Pharmacy Care Plan        CARE PLAN ENTRY (see longitudinal plan of care for additional care plan information)  Current Barriers:  . Chronic Disease Management support, education, and care coordination needs related to Hypertension, Hyperlipidemia, Diabetes, Asthma, Hypothyroidism, and Osteoporosis   Hypertension BP Readings from Last 3 Encounters:  06/27/20 (!) 144/66  06/19/20 140/72  06/19/20 140/72   . Pharmacist Clinical Goal(s): o Over the next 90 days, patient will work with PharmD and providers to achieve BP goal <130/80 . Current regimen:  o Amlodipine 2.43m every morning and 555mevery evening o Irbesartan 30082maily . Interventions: o Reviewed most recent BP readings o Reemphasized use of Mrs. Dash and salt limitation . Patient self care activities - Over the next 14 days, patient will: o Check BP daily, document, and provide at future appointments o Ensure daily salt intake < 2300 mg/day o Exercise for at least 30 minutes per day 5 days per week (150 minutes per week)  Hyperlipidemia Lab Results  Component Value Date/Time   LDLCALC 53 10/31/2019 04:35 PM   . Pharmacist Clinical Goal(s): o Over the next 90 days, patient will work with PharmD and providers to maintain LDL goal < 70 . Current regimen:  o Atorvastatin 80 mg daily . Interventions: o Provided  dietary and exercise recommendations . Patient self care activities - Over the next 90 days, patient will: o Take cholesterol medication daily as directed o Exercise for at least 30 minutes per day 5 days per week (150 minutes per week)  Diabetes Lab Results  Component Value Date/Time   HGBA1C 5.5 06/19/2020 02:29 PM   HGBA1C 5.7 (H) 03/20/2020 04:45 PM   . Pharmacist Clinical Goal(s): o Over the next 90 days, patient will work with PharmD and providers to maintain A1c goal <7% . Current regimen:  o No medications currently . Interventions: o Reviewed most recent A1c o Congratulated on current lifestyle modification  efforts . Patient self care activities - Over the next 90 days, patient will: o Check blood sugar several times per week, document, and provide at future appointments o Contact provider with any episodes of hypoglycemia o Exercise for at least 30 minutes per day 5 days per week (150 minutes per week)  Hypothyroidism . Pharmacist Clinical Goal(s) o Over the next 90 days, patient will work with PharmD and providers to obtain Synthroid medication . Current regimen:  o Synthroid 74mg daily Monday through Saturday, skip Sundays . Interventions: o Referred to CPA for help with renewal of Synthroid PAP through [facility]. Patient self care activities - Over the next 90 days, patient will: o Continue taking Synthroid daily as directed o Follow up with TFelicity Coyerwith any questions regarding renewal  Medication management . Pharmacist Clinical Goal(s): o Over the next 90 days, patient will work with PharmD and providers to achieve optimal medication adherence . Current pharmacy: [facility] . Interventions o Comprehensive medication review performed. o Continue current medication management strategy . Patient self care activities - Over the next 90 days, patient will: o Focus on medication adherence by considering use of pill box to aide in organization of medication o Take medications as prescribed o Report any questions or concerns to PharmD and/or provider(s)  Please see past updates related to this goal by clicking on the "Past Updates" button in the selected goal         Diabetes   Recent Relevant Labs: Lab Results  Component Value Date/Time   HGBA1C 5.5 06/19/2020 02:29 PM   HGBA1C 5.7 (H) 03/20/2020 04:45 PM   EGFR 44 (L) 08/12/2017 08:08 AM   EGFR 50 (L) 02/16/2017 08:29 AM   MICROALBUR 80 10/31/2019 12:13 PM   MICROALBUR 80 06/15/2019 06:11 PM    Kidney Function Lab Results  Component Value Date/Time   CREATININE 1.29 (H) 06/19/2020 02:29 PM   CREATININE 1.23 (H)  05/17/2020 02:04 PM   CREATININE 1.24 (H) 04/16/2020 03:16 PM   CREATININE 1.4 (H) 08/12/2017 08:08 AM   CREATININE 1.2 (H) 02/16/2017 08:29 AM   GFRNONAA 41 (L) 06/19/2020 02:29 PM   GFRNONAA 43 (L) 05/17/2020 02:04 PM   GFRAA 47 (L) 06/19/2020 02:29 PM   GFRAA 50 (L) 05/17/2020 02:04 PM   Checking BG: Three times weekly  Recent FBG Readings: 88-90 Recent pre-meal BG readings:  Recent 2hr PP BG readings:   Recent HS BG readings:  Patient has failed these meds in past: N/A Patient is currently controlled on the following medications:   No medications currently  Last diabetic Foot exam: 10/31/19 Last diabetic Eye exam: Lab Results  Component Value Date/Time   HMDIABEYEEXA No Retinopathy 08/17/2019 08:58 AM   We discussed:   She recently stopped the metformin altogether after last A1c result.  FBG well controlled, has f/u appointment for A1c upcoming  and will re-evaluate at that time  Denies hypoglycemia  Diet extensively  Still participating in weight watchers, reports about 17 lb following that program  Exercise extensively  Currently exercising 3x per week (MWF twice on fridays)  Chair yoga - twice weekly  Plan Continue current medications  Continue great work on diet/exercise!   Hypertension   Office blood pressures are  BP Readings from Last 3 Encounters:  06/27/20 (!) 144/66  06/19/20 140/72  06/19/20 140/72   Patient has failed these meds in the past: Azilsartan, furosemide, Edarbyclor, olmesartan/HCTZ, valsartan/HCTZ, valsartan Patient is currently controlled on the following medications:   Amlodipine 2.71m in the morning, 569min the evening  Irbesartan 30070maily  Patient checks BP at home daily  Patient home BP readings are ranging: 123/71 P 68  We discussed:  Diet extensively  Still using Mrs. Dash  See above for exercise  Discussed goal of 130/80  Plan Continue current medications   Hyperlipidemia   Lipid Panel     Component  Value Date/Time   CHOL 134 10/31/2019 1635   TRIG 70 10/31/2019 1635   HDL 67 10/31/2019 1635   LDLCALC 53 10/31/2019 1635    The 10-year ASCVD risk score (GoMikey Bussing Jr., et al., 2013) is: 28.5%   Values used to calculate the score:     Age: [age]     Sex: Female     Is Non-Hispanic African American: Yes     Diabetic: Yes     Tobacco smoker: No     Systolic Blood Pressure: 144784Hg     Is BP treated: Yes     HDL Cholesterol: 67 mg/dL     Total Cholesterol: 134 mg/dL   Patient has failed these meds in past: N/A Patient is currently controlled on the following medications:   Atorvastatin 42m34mily  Aspirin 81mg63mly  We discussed:   Diet and exercise extensively - see above  Tolerates medication fine  Plan Continue current medications   COPD / Asthma / Tobacco   Last spirometry score: 03/22/15 FEV1 1.02 (42%)  Gold Grade: Gold 1 (FEV1>80%), last spirometry would suggest more severe grade, but pt states significant symptoms improvement since moving to [place] Current COPD Classification:  A (low sx, <2 exacerbations/yr)  Eosinophil count:   Lab Results  Component Value Date/Time   EOSPCT 7.1 05/17/2020 02:04 PM   EOSPCT 9.2 (H) 08/12/2017 08:08 AM  %                               Eos (Absolute):  Lab Results  Component Value Date/Time   EOSABS 0.4 06/19/2020 02:29 PM   Tobacco Status:  Social History   Tobacco Use  Smoking Status Never Smoker  Smokeless Tobacco Never Used   Patient has failed these meds in past: Theophylline, Duoneb, Advair, Zafirlukast Patient is currently controlled on the following medications:   Symbicort 160/4.5mcg/8m 2 puffs twice daily  Albuterol 2 puffs every 6 hours as needed Using maintenance inhaler regularly? No Frequency of rescue inhaler use:  infrequently   States she only uses these as needed and has not has any SOB episodes lately  No concerns with cost  Plan Continue current medications  Hypothyroidism   Lab  Results  Component Value Date/Time   TSH 1.140 03/20/2020 04:45 PM   TSH 0.498 10/31/2019 04:39 PM   TSH 0.740 06/15/2019 12:22 PM    Patient has failed  these meds in past: generic levothyroxine Patient is currently controlled on the following medications:   Synthroid 23mg daily Monday through Saturday, skip Sundays  We discussed:    Patient assistance through [employer] She received renewal letter, unsure if she wants to apply for qualification.    Forwarded information to [employer], maintenance (IT)for renewal for 2022.  Plan Continue current medications    Vaccines   Reviewed and discussed patient's vaccination history.    Immunization History  Administered Date(s) Administered  . 19-influenza Whole 07/15/2012  . DTaP 06/18/2019  . Fluad Quad(high Dose 65+) 06/19/2020  . Influenza Split 06/28/2014  . Influenza, High Dose Seasonal PF 05/18/2019  . Influenza,inj,Quad PF,6+ Mos 06/20/2018  . Moderna SARS-COVID-2 Vaccination 11/20/2019, 12/19/2019  . Pneumococcal Conjugate-13 05/04/2018  . Pneumococcal Polysaccharide-23 05/18/2019  . Pneumococcal-Unspecified 06/28/2014  . Tdap 06/15/2019    Plan Recommended patient receive Shingrix vaccine in office/pharmacy.    Follow up:  3 month phone visit  CBeverly Milch PharmD Clinical Pharmacist [phone]

## 2020-08-01 NOTE — Patient Instructions (Addendum)
Visit Information  Goals Addressed   None     The patient verbalized understanding of instructions provided today and agreed to receive a mailed copy of patient instruction and/or educational materials.  Telephone follow up appointment with pharmacy team member scheduled for: 6 months Beverly Milch, PharmD Clinical Pharmacist [facility] [phone]   Managing Your Hypertension Hypertension is commonly called high blood pressure. This is when the force of your blood pressing against the walls of your arteries is too strong. Arteries are blood vessels that carry blood from your heart throughout your body. Hypertension forces the heart to work harder to pump blood, and may cause the arteries to become narrow or stiff. Having untreated or uncontrolled hypertension can cause heart attack, stroke, kidney disease, and other problems. What are blood pressure readings? A blood pressure reading consists of a higher number over a lower number. Ideally, your blood pressure should be below 120/80. The first ("top") number is called the systolic pressure. It is a measure of the pressure in your arteries as your heart beats. The second ("bottom") number is called the diastolic pressure. It is a measure of the pressure in your arteries as the heart relaxes. What does my blood pressure reading mean? Blood pressure is classified into four stages. Based on your blood pressure reading, your health care provider may use the following stages to determine what type of treatment you need, if any. Systolic pressure and diastolic pressure are measured in a unit called mm Hg. Normal  Systolic pressure: below 106.  Diastolic pressure: below 80. Elevated  Systolic pressure: 269-485.  Diastolic pressure: below 80. Hypertension stage 1  Systolic pressure: 462-703.  Diastolic pressure: 50-09. Hypertension stage 2  Systolic pressure: 381 or above.  Diastolic pressure: 90 or  above. What health risks are associated with hypertension? Managing your hypertension is an important responsibility. Uncontrolled hypertension can lead to:  A heart attack.  A stroke.  A weakened blood vessel (aneurysm).  Heart failure.  Kidney damage.  Eye damage.  Metabolic syndrome.  Memory and concentration problems. What changes can I make to manage my hypertension? Hypertension can be managed by making lifestyle changes and possibly by taking medicines. Your health care provider will help you make a plan to bring your blood pressure within a normal range. Eating and drinking   Eat a diet that is high in fiber and potassium, and low in salt (sodium), added sugar, and fat. An example eating plan is called the DASH (Dietary Approaches to Stop Hypertension) diet. To eat this way: ? Eat plenty of fresh fruits and vegetables. Try to fill half of your plate at each meal with fruits and vegetables. ? Eat whole grains, such as whole wheat pasta, brown rice, or whole grain bread. Fill about one quarter of your plate with whole grains. ? Eat low-fat diary products. ? Avoid fatty cuts of meat, processed or cured meats, and poultry with skin. Fill about one quarter of your plate with lean proteins such as fish, chicken without skin, beans, eggs, and tofu. ? Avoid premade and processed foods. These tend to be higher in sodium, added sugar, and fat.  Reduce your daily sodium intake. Most people with hypertension should eat less than 1,500 mg of sodium a day.  Limit alcohol intake to no more than 1 drink a day for nonpregnant women and 2 drinks a day for men. One drink equals 12 oz of beer, 5 oz of wine, or 1 oz of hard  liquor. Lifestyle  Work with your health care provider to maintain a healthy body weight, or to lose weight. Ask what an ideal weight is for you.  Get at least 30 minutes of exercise that causes your heart to beat faster (aerobic exercise) most days of the week.  Activities may include walking, swimming, or biking.  Include exercise to strengthen your muscles (resistance exercise), such as weight lifting, as part of your weekly exercise routine. Try to do these types of exercises for 30 minutes at least 3 days a week.  Do not use any products that contain nicotine or tobacco, such as cigarettes and e-cigarettes. If you need help quitting, ask your health care provider.  Control any long-term (chronic) conditions you have, such as high cholesterol or diabetes. Monitoring  Monitor your blood pressure at home as told by your health care provider. Your personal target blood pressure may vary depending on your medical conditions, your age, and other factors.  Have your blood pressure checked regularly, as often as told by your health care provider. Working with your health care provider  Review all the medicines you take with your health care provider because there may be side effects or interactions.  Talk with your health care provider about your diet, exercise habits, and other lifestyle factors that may be contributing to hypertension.  Visit your health care provider regularly. Your health care provider can help you create and adjust your plan for managing hypertension. Will I need medicine to control my blood pressure? Your health care provider may prescribe medicine if lifestyle changes are not enough to get your blood pressure under control, and if:  Your systolic blood pressure is 130 or higher.  Your diastolic blood pressure is 80 or higher. Take medicines only as told by your health care provider. Follow the directions carefully. Blood pressure medicines must be taken as prescribed. The medicine does not work as well when you skip doses. Skipping doses also puts you at risk for problems. Contact a health care provider if:  You think you are having a reaction to medicines you have taken.  You have repeated (recurrent) headaches.  You feel  dizzy.  You have swelling in your ankles.  You have trouble with your vision. Get help right away if:  You develop a severe headache or confusion.  You have unusual weakness or numbness, or you feel faint.  You have severe pain in your chest or abdomen.  You vomit repeatedly.  You have trouble breathing. Summary  Hypertension is when the force of blood pumping through your arteries is too strong. If this condition is not controlled, it may put you at risk for serious complications.  Your personal target blood pressure may vary depending on your medical conditions, your age, and other factors. For most people, a normal blood pressure is less than 120/80.  Hypertension is managed by lifestyle changes, medicines, or both. Lifestyle changes include weight loss, eating a healthy, low-sodium diet, exercising more, and limiting alcohol. This information is not intended to replace advice given to you by your health care provider. Make sure you discuss any questions you have with your health care provider. Document Revised: 01/06/2019 Document Reviewed: 08/12/2016 Elsevier Patient Education  [employer].

## 2020-08-07 ENCOUNTER — Telehealth: Payer: Self-pay

## 2020-08-07 NOTE — Chronic Care Management (AMB) (Signed)
Chronic Care Management Pharmacy Assistant   Name: Hannah [name]  MRN: [id number] DOB: 08-01-1946  Reason for Encounter: Medication Review  PCP : Hannah Chard, MD    08/07/2020- Patient called inquiring about Synthroid patient assistance paperwork from [facility] Assist. She received new forms to be completed for next year assistance supplies on her Synthroid 88 mcg. Patient was approved for Synthroid medication from 05/29/2020 through 09/27/2020. She will need new application along with appropriate documentation to be sent to [facility] assist to determine next year approval. Patient would like to sit down with Pharmacist Hannah [name] to review all information is correct before sending, patient states nothing has changed but more out of pocket expense for her medications and she will bring that documentation during visit. Patient scheduled for 08/15/2020 at 10 am with PharmD.  Hannah [name], CPP notified.     Allergies:   Allergies  Allergen Reactions  . Shellfish Allergy Anaphylaxis and Hives    Medications: Outpatient Encounter Medications as of 08/07/2020  Medication Sig  . albuterol (PROVENTIL HFA;VENTOLIN HFA) 108 (90 Base) MCG/ACT inhaler Inhale 2 puffs into the lungs every 6 (six) hours as needed for wheezing or shortness of breath.  . Alcohol Swabs (ALCOHOL PADS) 70 % PADS Use as directed to check blood sugars 1 time per day dx: e11.22  . allopurinol (ZYLOPRIM) 100 MG tablet TAKE 1 TABLET BY MOUTH EVERY DAY FOR GOUT  . amLODipine (NORVASC) 5 MG tablet Take 1 tablet (5 mg total) by mouth daily. 1/2 tab in the am and 1 tab in the pm  . aspirin EC 81 MG tablet Take 81 mg by mouth daily.  [name] atorvastatin (LIPITOR) 80 MG tablet TAKE 1 TABLET BY MOUTH EVERY DAY  . Blood Glucose Calibration (TRUE METRIX LEVEL 1) Low SOLN Use as directed to check blood sugars 1 time per day dx: e11.22  . Blood Glucose Monitoring Suppl (TRUE METRIX METER) w/Device KIT Use as directed to check  blood sugars 1 time per day dx: e11.22  . budesonide-formoterol (SYMBICORT) 160-4.5 MCG/ACT inhaler INHALE 2 PUFFS BY MOUTH TWICE DAILY IN THE MORNING AND EVENING  . Calcium Carb-Cholecalciferol (CALCIUM 600+D) 600-800 MG-UNIT TABS Take 1 tablet by mouth daily.   . Cholecalciferol (VITAMIN D PO) Take 2,000 Units by mouth daily.   [name] denosumab (PROLIA) 60 MG/ML SOSY injection Inject 60 mg into the skin every 6 (six) months.  . hydroxychloroquine (PLAQUENIL) 200 MG tablet TAKE 1 TABLET BY MOUTH DAILY FOR RHEUMATOID ARTHRITIS  . Hypromellose (ARTIFICIAL TEARS OP) Apply to eye as needed.  . irbesartan (AVAPRO) 300 MG tablet TAKE 1 TABLET(300 MG) BY MOUTH DAILY  . metFORMIN (GLUCOPHAGE) 500 MG tablet Take 1 tablet (500 mg total) by mouth daily with breakfast. (Patient not taking: Reported on 08/01/2020)  . SYNTHROID 88 MCG tablet TAKE 1 TABLET BY MOUTH EVERY DAY MONDAY TO SATURDAY AND OFF ON SUNDAYS  . tacrolimus (PROTOPIC) 0.1 % ointment Apply topically daily.  Hannah [name] METRIX BLOOD GLUCOSE TEST test strip Use as directed to check blood sugars 1 time per day dx: e11.22  . TRUEplus Lancets 33G MISC Use as directed to check blood sugars 1 time per day dx: e11.22   No facility-administered encounter medications on file as of 08/07/2020.    Current Diagnosis: Patient Active Problem List   Diagnosis Date Noted  . PVC (premature ventricular contraction) 05/06/2020  . Type 2 diabetes mellitus with stage 3 chronic kidney disease, without long-term current use of insulin ([place])  03/20/2020  . Palpitations 03/20/2020  . 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/2020  . Primary hypothyroidism 03/20/2020  . Vitiligo 03/20/2020  . Iron deficiency anemia 08/12/2017  . Osteoporosis 10/02/2016  . Systemic lupus erythematosus ([place]) 10/02/2016  . Rheumatoid arthritis involving multiple sites with positive rheumatoid factor ([place]) 10/02/2016  . High risk medication use 10/02/2016  . History of chronic kidney disease  10/02/2016  . 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/2016  . Primary osteoarthritis of both knees 10/02/2016  . Vitamin D deficiency 10/02/2016  . History of diabetes mellitus 10/02/2016  . History of hypertension 10/02/2016  . History of asthma 10/02/2016  . Absolute anemia   . MGUS (monoclonal gammopathy of unknown significance)   . Normocytic anemia 03/21/2015  . Essential hypertension 03/20/2015  . Abnormal CT of the chest 07/30/2012  . Asthma 06/19/2012     Follow-Up:  Patient [place], [place] Pharmacist Assistant [phone]

## 2020-08-09 DIAGNOSIS — M8589 Other specified disorders of bone density and structure, multiple sites: Secondary | ICD-10-CM | POA: Diagnosis not present

## 2020-08-09 LAB — HM DEXA SCAN

## 2020-08-09 LAB — HM MAMMOGRAPHY

## 2020-08-14 ENCOUNTER — Other Ambulatory Visit: Payer: Self-pay

## 2020-08-14 ENCOUNTER — Ambulatory Visit: Payer: Medicare HMO | Admitting: Rheumatology

## 2020-08-14 ENCOUNTER — Encounter: Payer: Self-pay | Admitting: Rheumatology

## 2020-08-14 VITALS — BP 138/78 | HR 62 | Resp 14 | Ht 59.0 in | Wt 150.0 lb

## 2020-08-14 DIAGNOSIS — M170 Bilateral primary osteoarthritis of knee: Secondary | ICD-10-CM

## 2020-08-14 DIAGNOSIS — M810 Age-related osteoporosis without current pathological fracture: Secondary | ICD-10-CM | POA: Diagnosis not present

## 2020-08-14 DIAGNOSIS — Z862 Personal history of diseases of the blood and blood-forming organs and certain disorders involving the immune mechanism: Secondary | ICD-10-CM

## 2020-08-14 DIAGNOSIS — M25362 Other instability, left knee: Secondary | ICD-10-CM

## 2020-08-14 DIAGNOSIS — D472 Monoclonal gammopathy: Secondary | ICD-10-CM | POA: Diagnosis not present

## 2020-08-14 DIAGNOSIS — M1A09X0 Idiopathic chronic gout, multiple sites, without tophus (tophi): Secondary | ICD-10-CM

## 2020-08-14 DIAGNOSIS — M3219 Other organ or system involvement in systemic lupus erythematosus: Secondary | ICD-10-CM

## 2020-08-14 DIAGNOSIS — Z87448 Personal history of other diseases of urinary system: Secondary | ICD-10-CM | POA: Diagnosis not present

## 2020-08-14 DIAGNOSIS — Z79899 Other long term (current) drug therapy: Secondary | ICD-10-CM | POA: Diagnosis not present

## 2020-08-14 DIAGNOSIS — Z8639 Personal history of other endocrine, nutritional and metabolic disease: Secondary | ICD-10-CM

## 2020-08-14 DIAGNOSIS — Z8709 Personal history of other diseases of the respiratory system: Secondary | ICD-10-CM

## 2020-08-14 DIAGNOSIS — Z8679 Personal history of other diseases of the circulatory system: Secondary | ICD-10-CM

## 2020-08-14 DIAGNOSIS — M0579 Rheumatoid arthritis with rheumatoid factor of multiple sites without organ or systems involvement: Secondary | ICD-10-CM

## 2020-08-15 ENCOUNTER — Encounter: Payer: Self-pay | Admitting: Internal Medicine

## 2020-08-15 ENCOUNTER — Telehealth: Payer: Self-pay

## 2020-08-15 ENCOUNTER — Ambulatory Visit: Payer: Medicare HMO

## 2020-08-15 DIAGNOSIS — Z8679 Personal history of other diseases of the circulatory system: Secondary | ICD-10-CM

## 2020-08-15 DIAGNOSIS — N183 Chronic kidney disease, stage 3 unspecified: Secondary | ICD-10-CM

## 2020-08-15 DIAGNOSIS — E1122 Type 2 diabetes mellitus with diabetic chronic kidney disease: Secondary | ICD-10-CM

## 2020-08-15 LAB — CBC WITH DIFFERENTIAL/PLATELET
Absolute Monocytes: 350 cells/uL (ref 200–950)
Basophils Absolute: 48 cells/uL (ref 0–200)
Basophils Relative: 1 %
Eosinophils Absolute: 451 cells/uL (ref 15–500)
Eosinophils Relative: 9.4 %
HCT: 31.6 % — ABNORMAL LOW (ref 35.0–45.0)
Hemoglobin: 10.4 g/dL — ABNORMAL LOW (ref 11.7–15.5)
Lymphs Abs: 1205 cells/uL (ref 850–3900)
MCH: 32 pg (ref 27.0–33.0)
MCHC: 32.9 g/dL (ref 32.0–36.0)
MCV: 97.2 fL (ref 80.0–100.0)
MPV: 10.9 fL (ref 7.5–12.5)
Monocytes Relative: 7.3 %
Neutro Abs: 2746 cells/uL (ref 1500–7800)
Neutrophils Relative %: 57.2 %
Platelets: 180 10*3/uL (ref 140–400)
RBC: 3.25 10*6/uL — ABNORMAL LOW (ref 3.80–5.10)
RDW: 13 % (ref 11.0–15.0)
Total Lymphocyte: 25.1 %
WBC: 4.8 10*3/uL (ref 3.8–10.8)

## 2020-08-15 LAB — COMPLETE METABOLIC PANEL WITH GFR
AG Ratio: 1.9 (calc) (ref 1.0–2.5)
ALT: 8 U/L (ref 6–29)
AST: 15 U/L (ref 10–35)
Albumin: 4 g/dL (ref 3.6–5.1)
Alkaline phosphatase (APISO): 40 U/L (ref 37–153)
BUN/Creatinine Ratio: 14 (calc) (ref 6–22)
BUN: 14 mg/dL (ref 7–25)
CO2: 27 mmol/L (ref 20–32)
Calcium: 10.1 mg/dL (ref 8.6–10.4)
Chloride: 104 mmol/L (ref 98–110)
Creat: 1 mg/dL — ABNORMAL HIGH (ref 0.60–0.93)
GFR, Est African American: 64 mL/min/{1.73_m2} (ref >=60)
GFR, Est Non African American: 55 mL/min/{1.73_m2} — ABNORMAL LOW (ref >=60)
Globulin: 2.1 g/dL (calc) (ref 1.9–3.7)
Glucose, Bld: 87 mg/dL (ref 65–139)
Potassium: 4.3 mmol/L (ref 3.5–5.3)
Sodium: 141 mmol/L (ref 135–146)
Total Bilirubin: 0.5 mg/dL (ref 0.2–1.2)
Total Protein: 6.1 g/dL (ref 6.1–8.1)

## 2020-08-15 LAB — URINALYSIS, ROUTINE W REFLEX MICROSCOPIC
Bacteria, UA: NONE SEEN /HPF
Bilirubin Urine: NEGATIVE
Glucose, UA: NEGATIVE
Hgb urine dipstick: NEGATIVE
Hyaline Cast: NONE SEEN /LPF
Ketones, ur: NEGATIVE
Nitrite: NEGATIVE
Protein, ur: NEGATIVE
Specific Gravity, Urine: 1.006 (ref 1.001–1.03)
pH: 6.5 (ref 5.0–8.0)

## 2020-08-15 LAB — ANTI-DNA ANTIBODY, DOUBLE-STRANDED: ds DNA Ab: <1 IU/mL

## 2020-08-15 LAB — SEDIMENTATION RATE: Sed Rate: 6 mm/h (ref 0–30)

## 2020-08-15 LAB — C3 AND C4
C3 Complement: 118 mg/dL (ref 83–193)
C4 Complement: 45 mg/dL (ref 15–57)

## 2020-08-15 LAB — URIC ACID: Uric Acid, Serum: 3.9 mg/dL (ref 2.5–7.0)

## 2020-08-15 NOTE — Progress Notes (Signed)
Labs are stable.  She continues to be anemic.  No change in therapy advised.

## 2020-08-15 NOTE — Telephone Encounter (Signed)
The pt was notified that Dr. Baird Cancer said the pt's bone density results shows osteoporosis and for the pt to continue with her calcium, vitd, and weight bearing exercises.

## 2020-08-15 NOTE — Chronic Care Management (AMB) (Signed)
Chronic Care Management Pharmacy  Name: Hannah [name]  MRN: [id number] DOB: 1946-07-23  Chief Complaint/ HPI  [name],  74 y.o. , female presents for their Follow-Up CCM visit with the clinical pharmacist In office. Patient moved to [place], [place] 8 years ago when she retired from [employer] in [place], [place]. She moved here to be closer to her sister who also lives in [place], [place].  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 She was volunteering at the [facility] for over 5 years but she has stopped since the COVID-19 pandemic. She has already received her booster for COVID-19 keeps a busy lifestyle. She usually wakes up around 7 am and she tries to stay as healthy as possible   PCP : Glendale Chard, MD  Their chronic conditions include: Hypertension, Rheumatoid arthritis, Systemic lupus erythematosus. Hypothyroidism, Type 2 diabetes mellitus, Gout, Anemia, Osteoporosis, Osteoarthritis   Office Visits:  06/19/20 OV: DM and BP check.  Given HD flu shot in office, advised to aim for at least 150 minutes of exercise per week.  04/02/20 Nurse visit: Presented for BP check. 132/64. Continue current medications.   03/26/20 Nurse visit: Presented for BP check. Pt machine 145/78, manual 138/72.  03/20/20 OV: Labs ordered (HgbA1c, TSH, T4 free, Magnesium, ANA IFA). Pt complained of palpitations. No arrhythmias noted on EKG. Referred to Cardiology for further evaluation. HTN uncontrolled, encouraged to keep daily BP log at home.   Medications: Outpatient Encounter Medications as of 08/15/2020  Medication Sig  . albuterol (PROVENTIL HFA;VENTOLIN HFA) 108 (90 Base) MCG/ACT inhaler Inhale 2 puffs into the lungs every 6 (six) hours as needed for wheezing or shortness of breath.  . Alcohol Swabs (ALCOHOL PADS) 70 % PADS Use as directed to check blood sugars 1 time per day dx: e11.22  . allopurinol (ZYLOPRIM) 100 MG tablet TAKE 1 TABLET BY MOUTH EVERY  DAY FOR GOUT  . amLODipine (NORVASC) 5 MG tablet Take 1 tablet (5 mg total) by mouth daily. 1/2 tab in the am and 1 tab in the pm  . aspirin EC 81 MG tablet Take 81 mg by mouth daily.  [name] atorvastatin (LIPITOR) 80 MG tablet TAKE 1 TABLET BY MOUTH EVERY DAY  . Blood Glucose Calibration (TRUE METRIX LEVEL 1) Low SOLN Use as directed to check blood sugars 1 time per day dx: e11.22  . Blood Glucose Monitoring Suppl (TRUE METRIX METER) w/Device KIT Use as directed to check blood sugars 1 time per day dx: e11.22  . budesonide-formoterol (SYMBICORT) 160-4.5 MCG/ACT inhaler INHALE 2 PUFFS BY MOUTH TWICE DAILY IN THE MORNING AND EVENING  . Calcium Carb-Cholecalciferol (CALCIUM 600+D) 600-800 MG-UNIT TABS Take 1 tablet by mouth daily.   . Cholecalciferol (VITAMIN D PO) Take 2,000 Units by mouth daily.   [name] denosumab (PROLIA) 60 MG/ML SOSY injection Inject 60 mg into the skin every 6 (six) months.  . hydroxychloroquine (PLAQUENIL) 200 MG tablet TAKE 1 TABLET BY MOUTH DAILY FOR RHEUMATOID ARTHRITIS  . Hypromellose (ARTIFICIAL TEARS OP) Apply to eye as needed.  . irbesartan (AVAPRO) 300 MG tablet TAKE 1 TABLET(300 MG) BY MOUTH DAILY  . iron polysaccharides (NIFEREX) 150 MG capsule Take 150 mg by mouth daily.  [name] SYNTHROID 88 MCG tablet TAKE 1 TABLET BY MOUTH EVERY DAY MONDAY TO SATURDAY AND OFF ON SUNDAYS  . tacrolimus (PROTOPIC) 0.1 % ointment Apply topically daily.  . TRUE METRIX BLOOD GLUCOSE TEST test strip Use as directed to  check blood sugars 1 time per day dx: e11.22  . TRUEplus Lancets 33G MISC Use as directed to check blood sugars 1 time per day dx: e11.22   No facility-administered encounter medications on file as of 08/15/2020.    Current Diagnosis/Assessment:   Goals Addressed            This Visit's Progress   . Pharmacy Care Plan       CARE PLAN ENTRY (see longitudinal plan of care for additional care plan information)  Current Barriers:  . Chronic Disease Management support,  education, and care coordination needs related to Hypertension, Hyperlipidemia, Diabetes, Asthma, Hypothyroidism, and Osteoporosis   Hypertension BP Readings from Last 3 Encounters:  06/27/20 (!) 144/66  06/19/20 140/72  06/19/20 140/72   . Pharmacist Clinical Goal(s): o Over the next 90 days, patient will work with PharmD and providers to achieve BP goal <130/80 . Current regimen:  o Amlodipine 2.16m every morning and 539mevery evening o Irbesartan 3004maily . Interventions: o Reviewed most recent BP readings o Reemphasized use of Mrs. Dash and salt limitation . Patient self care activities - Over the next 14 days, patient will: o Check BP daily, document, and provide at future appointments o Ensure daily salt intake < 2300 mg/day o Exercise for at least 30 minutes per day 5 days per week (150 minutes per week)  Hyperlipidemia Lab Results  Component Value Date/Time   LDLCALC 53 10/31/2019 04:35 PM   . Pharmacist Clinical Goal(s): o Over the next 90 days, patient will work with PharmD and providers to maintain LDL goal < 70 . Current regimen:  o Atorvastatin 80 mg daily . Interventions: o Provided dietary and exercise recommendations o Taking medication at the same time each day . Patient self care activities - Over the next 90 days, patient will: o Take cholesterol medication daily as directed o Exercise for at least 30 minutes per day 5 days per week (150 minutes per week)  Diabetes Lab Results  Component Value Date/Time   HGBA1C 5.5 06/19/2020 02:29 PM   HGBA1C 5.7 (H) 03/20/2020 04:45 PM   . Pharmacist Clinical Goal(s): o Over the next 90 days, patient will work with PharmD and providers to maintain A1c goal <7% . Current regimen:  o No medications currently . Interventions: o Reviewed most recent A1c o Congratulated on current lifestyle modification efforts . Patient self care activities - Over the next 90 days, patient will: o Check blood sugar several times  per week, document, and provide at future appointments o Contact provider with any episodes of hypoglycemia o Exercise for at least 30 minutes per day 5 days per week (150 minutes per week)  Hypothyroidism . Pharmacist Clinical Goal(s) o Over the next 90 days, patient will work with PharmD and providers to obtain Synthroid medication . Current regimen:  o Synthroid 4m69maily Monday through Saturday, skip Sundays . Interventions: o Referred to CPA for help with [facility] activities - Over the next 90 days, patient will: o Continue taking Synthroid daily as directed o Follow up with TamaFelicity Coyerh any questions regarding renewal  Medication management . Pharmacist Clinical Goal(s): o Over the next 90 days, patient will work with PharmD and providers to achieve optimal medication adherence . Current pharmacy: [facility] . Interventions o Comprehensive medication review performed. o Continue current medication management strategy . Patient self care activities - Over the next 90 days, patient will: o Focus on  medication adherence by considering use of pill box to aide in organization of medication o Take medications as prescribed o Report any questions or concerns to PharmD and/or provider(s)  Please see past updates related to this goal by clicking on the "Past Updates" button in the selected goal         Diabetes   Recent Relevant Labs: Lab Results  Component Value Date/Time   HGBA1C 5.5 06/19/2020 02:29 PM   HGBA1C 5.7 (H) 03/20/2020 04:45 PM   EGFR 44 (L) 08/12/2017 08:08 AM   EGFR 50 (L) 02/16/2017 08:29 AM   MICROALBUR 80 10/31/2019 12:13 PM   MICROALBUR 80 06/15/2019 06:11 PM    Kidney Function Lab Results  Component Value Date/Time   CREATININE 1.00 (H) 08/14/2020 03:46 PM   CREATININE 1.29 (H) 06/19/2020 02:29 PM   CREATININE 1.23 (H) 05/17/2020 02:04 PM   CREATININE 1.4 (H) 08/12/2017 08:08 AM   CREATININE 1.2  (H) 02/16/2017 08:29 AM   GFRNONAA 55 (L) 08/14/2020 03:46 PM   GFRAA 64 08/14/2020 03:46 PM   Checking BG: Three times weekly  Recent FBG Readings: 88-90 Recent pre-meal BG readings:  Recent 2hr PP BG readings:   Recent HS BG readings:  Patient has failed these meds in past: N/A Patient is currently controlled on the following medications:   No medications currently  Last diabetic Foot exam: 10/31/19 Last diabetic Eye exam: Lab Results  Component Value Date/Time   HMDIABEYEEXA No Retinopathy 08/17/2019 08:58 AM   We discussed:   She recently stopped the metformin altogether after last A1c result.  FBG well controlled, has f/u appointment for A1c upcoming and will re-evaluate at that time  Denies hypoglycemia  Diet extensively  Still participating in weight watchers, reports about 17 lb following that program  Exercise extensively  Currently exercising 3x per week (MWF twice on fridays)  Plan Continue current medications  Continue great work on diet/exercise!   Hypertension   Office blood pressures are  BP Readings from Last 3 Encounters:  08/14/20 138/78  06/27/20 (!) 144/66  06/19/20 140/72   Patient has failed these meds in the past: Azilsartan, furosemide, Edarbyclor, olmesartan/HCTZ, valsartan/HCTZ, valsartan Patient is currently controlled on the following medications:   Amlodipine 2.29m in the morning, 570min the evening  Irbesartan 30042maily  Patient checks BP at home daily  Patient home BP readings are ranging: 123/71 P 68  We discussed:  Diet extensively  Still using Mrs. Dash  See above for exercise  Discussed goal of 130/80  Plan Continue current medications   Hyperlipidemia   Lipid Panel     Component Value Date/Time   CHOL 134 10/31/2019 1635   TRIG 70 10/31/2019 1635   HDL 67 10/31/2019 1635   LDLCALC 53 10/31/2019 1635    The 10-year ASCVD risk score (GoMikey Bussing Jr., et al., 2013) is: 28.2%   Values used to calculate the  score:     Age[age]     Sex: Female     Is Non-Hispanic African American: Yes     Diabetic: Yes     Tobacco smoker: No     Systolic Blood Pressure: 138696Hg     Is BP treated: Yes     HDL Cholesterol: 67 mg/dL     Total Cholesterol: 134 mg/dL   Patient has failed these meds in past: N/A Patient is currently controlled on the following medications:   Atorvastatin 42m19mily  Aspirin 81mg27mly  We discussed:  Diet and exercise extensively - see above  Tolerates medication fine  Plan Continue current medications   COPD / Asthma / Tobacco   Last spirometry score: 03/22/15 FEV1 1.02 (42%)  Gold Grade: Gold 1 (FEV1>80%), last spirometry would suggest more severe grade, but pt states significant symptoms improvement since moving to [place] Current COPD Classification:  A (low sx, <2 exacerbations/yr)  Eosinophil count:   Lab Results  Component Value Date/Time   EOSPCT 9.4 08/14/2020 03:46 PM   EOSPCT 9.2 (H) 08/12/2017 08:08 AM  %                               Eos (Absolute):  Lab Results  Component Value Date/Time   EOSABS 451 08/14/2020 03:46 PM   EOSABS 0.4 06/19/2020 02:29 PM   Tobacco Status:  Social History   Tobacco Use  Smoking Status Never Smoker  Smokeless Tobacco Never Used   Patient has failed these meds in past: Theophylline, Duoneb, Advair, Zafirlukast Patient is currently controlled on the following medications:   Symbicort 160/4.60mg/act 2 puffs twice daily  Albuterol 2 puffs every 6 hours as needed Using maintenance inhaler regularly? No Frequency of rescue inhaler use:  infrequently   States she only uses these as needed and has not has any SOB episodes lately  No concerns with cost  Plan Continue current medications  Hypothyroidism   Lab Results  Component Value Date/Time   TSH 1.140 03/20/2020 04:45 PM   TSH 0.498 10/31/2019 04:39 PM   TSH 0.740 06/15/2019 12:22 PM    Patient has failed these meds in past: generic  levothyroxine Patient is currently controlled on the following medications:   Synthroid 873m daily Monday through Saturday, skip Sundays  We discussed:    Patient assistance form completions   Plan Continue current medications    Vaccines   Reviewed and discussed patient's vaccination history.    Immunization History  Administered Date(s) Administered  . 19-influenza Whole 07/15/2012  . DTaP 06/18/2019  . Fluad Quad(high Dose 65+) 06/19/2020  . Influenza Split 06/28/2014  . Influenza, High Dose Seasonal PF 05/18/2019  . Influenza,inj,Quad PF,6+ Mos 06/20/2018  . Moderna SARS-COVID-2 Vaccination 11/20/2019, 12/19/2019, 08/06/2020  . Pneumococcal Conjugate-13 05/04/2018  . Pneumococcal Polysaccharide-23 05/18/2019  . Pneumococcal-Unspecified 06/28/2014  . Tdap 06/15/2019    Plan Recommended patient receive Shingrix vaccine in office/pharmacy. Medication Management   [facility]  [id number] [address]one: [phone]: [address]one: [phone]: [address]one: [phone]: [phone] pill box? Yes Pt endorses 95% compliance  We discussed: Current pharmacy is preferred with insurance plan and patient is satisfied with pharmacy services, usually using [facility]  Continue current medication management strategy    Follow up: 2 month phone visit  VaOrlando PennerPharmD Clinical Pharmacist [facility] [phone]

## 2020-08-16 ENCOUNTER — Telehealth: Payer: Self-pay | Admitting: *Deleted

## 2020-08-16 NOTE — Telephone Encounter (Signed)
Patient called. She saw Dr. Estanislado Pandy and had labs done. Hgb 10.4, Dr. Estanislado Pandy told her she was  still anemic at this time.  She wanted to know if Dr. Irene Limbo wanted her to increase the Poly Iron 150 mg from 3 times a week to every day.  Message routed to Dr. Irene Limbo.

## 2020-08-20 NOTE — Telephone Encounter (Signed)
Contacted patient with Dr. Grier Mitts response : yes -- daily sounds okay, though we do not know what her ferritin level is and if she is truly iron deficient, could also be due to to her rheumatoid arthritis causing anemia of chronic disease. Patient verbalized understanding of message

## 2020-09-05 ENCOUNTER — Encounter: Payer: Medicare HMO | Admitting: Obstetrics & Gynecology

## 2020-09-06 DIAGNOSIS — M9902 Segmental and somatic dysfunction of thoracic region: Secondary | ICD-10-CM | POA: Diagnosis not present

## 2020-09-06 DIAGNOSIS — M5137 Other intervertebral disc degeneration, lumbosacral region: Secondary | ICD-10-CM | POA: Diagnosis not present

## 2020-09-06 DIAGNOSIS — M9903 Segmental and somatic dysfunction of lumbar region: Secondary | ICD-10-CM | POA: Diagnosis not present

## 2020-09-06 DIAGNOSIS — M47814 Spondylosis without myelopathy or radiculopathy, thoracic region: Secondary | ICD-10-CM | POA: Diagnosis not present

## 2020-09-06 DIAGNOSIS — M5136 Other intervertebral disc degeneration, lumbar region: Secondary | ICD-10-CM | POA: Diagnosis not present

## 2020-09-06 DIAGNOSIS — M9905 Segmental and somatic dysfunction of pelvic region: Secondary | ICD-10-CM | POA: Diagnosis not present

## 2020-09-08 NOTE — Patient Instructions (Signed)
Visit Information  Goals Addressed            This Visit's Progress   . Pharmacy Care Plan       CARE PLAN ENTRY (see longitudinal plan of care for additional care plan information)  Current Barriers:  . Chronic Disease Management support, education, and care coordination needs related to Hypertension, Hyperlipidemia, Diabetes, Asthma, Hypothyroidism, and Osteoporosis   Hypertension BP Readings from Last 3 Encounters:  06/27/20 (!) 144/66  06/19/20 140/72  06/19/20 140/72   . Pharmacist Clinical Goal(s): o Over the next 90 days, patient will work with PharmD and providers to achieve BP goal <130/80 . Current regimen:  o Amlodipine 2.5mg  every morning and 5mg  every evening o Irbesartan 300mg  daily . Interventions: o Reviewed most recent BP readings o Reemphasized use of Mrs. Dash and salt limitation . Patient self care activities - Over the next 14 days, patient will: o Check BP daily, document, and provide at future appointments o Ensure daily salt intake < 2300 mg/day o Exercise for at least 30 minutes per day 5 days per week (150 minutes per week)  Hyperlipidemia Lab Results  Component Value Date/Time   LDLCALC 53 10/31/2019 04:35 PM   . Pharmacist Clinical Goal(s): o Over the next 90 days, patient will work with PharmD and providers to maintain LDL goal < 70 . Current regimen:  o Atorvastatin 80 mg daily . Interventions: o Provided dietary and exercise recommendations o Taking medication at the same time each day . Patient self care activities - Over the next 90 days, patient will: o Take cholesterol medication daily as directed o Exercise for at least 30 minutes per day 5 days per week (150 minutes per week)  Diabetes Lab Results  Component Value Date/Time   HGBA1C 5.5 06/19/2020 02:29 PM   HGBA1C 5.7 (H) 03/20/2020 04:45 PM   . Pharmacist Clinical Goal(s): o Over the next 90 days, patient will work with PharmD and providers to maintain A1c goal  <7% . Current regimen:  o No medications currently . Interventions: o Reviewed most recent A1c o Congratulated on current lifestyle modification efforts . Patient self care activities - Over the next 90 days, patient will: o Check blood sugar several times per week, document, and provide at future appointments o Contact provider with any episodes of hypoglycemia o Exercise for at least 30 minutes per day 5 days per week (150 minutes per week)  Hypothyroidism . Pharmacist Clinical Goal(s) o Over the next 90 days, patient will work with PharmD and providers to obtain Synthroid medication . Current regimen:  o Synthroid 8mcg daily Monday through Saturday, skip Sundays . Interventions: o Referred to CPA for help with renewal of Synthroid PAP through [facility] . Patient self care activities - Over the next 90 days, patient will: o Continue taking Synthroid daily as directed o Follow up with Felicity Coyer with any questions regarding renewal  Medication management . Pharmacist Clinical Goal(s): o Over the next 90 days, patient will work with PharmD and providers to achieve optimal medication adherence . Current pharmacy: [facility] . Interventions o Comprehensive medication review performed. o Continue current medication management strategy . Patient self care activities - Over the next 90 days, patient will: o Focus on medication adherence by considering use of pill box to aide in organization of medication o Take medications as prescribed o Report any questions or concerns to PharmD and/or provider(s)  Please see past updates related to this goal by clicking on the "Past Updates"  button in the selected goal         The patient verbalized understanding of instructions, educational materials, and care plan provided today and agreed to receive a mailed copy of patient instructions, educational materials, and care plan.   The pharmacy team will reach out to the patient again over the next  90 days.   [name], PharmD Clinical Pharmacist [facility] [phone]

## 2020-09-09 ENCOUNTER — Other Ambulatory Visit: Payer: Self-pay | Admitting: Internal Medicine

## 2020-09-09 ENCOUNTER — Telehealth: Payer: Self-pay

## 2020-09-09 NOTE — Telephone Encounter (Cosign Needed)
  Chronic Care Management   Outreach Note  09/09/2020 Name: Hannah [name] MRN: [id number] DOB: 01-May-1946  Referred by: Glendale Chard, MD Reason for referral : Chronic Care Management (Inbound Call from patient )   KHUSHI ZUPKO is enrolled in a Managed Medicaid Health Plan: No  Voice message received from patient stating her CBG's are elevated since stopping Metformin. She would like to ask Dr. Baird Cancer if she can restart her Metformin 500 mg qd. Collaborated with Dr. Bobetta Lime regarding Ms. Kohn concerns and request to restart Metformin. Dr. Baird Cancer stated she will contact the patient. The patient was referred to the case management team for assistance with care management and care coordination.   Follow Up Plan: Telephone follow up appointment with care management team member scheduled for: 09/19/20  Barb Merino, RN, BSN, CCM Care Management Coordinator [place] [facility]  Direct Phone: [phone]

## 2020-09-10 ENCOUNTER — Encounter: Payer: Medicare HMO | Admitting: Obstetrics & Gynecology

## 2020-09-10 ENCOUNTER — Other Ambulatory Visit: Payer: Self-pay

## 2020-09-12 DIAGNOSIS — H26493 Other secondary cataract, bilateral: Secondary | ICD-10-CM | POA: Diagnosis not present

## 2020-09-12 DIAGNOSIS — Z79899 Other long term (current) drug therapy: Secondary | ICD-10-CM | POA: Diagnosis not present

## 2020-09-12 DIAGNOSIS — Z961 Presence of intraocular lens: Secondary | ICD-10-CM | POA: Diagnosis not present

## 2020-09-12 DIAGNOSIS — E119 Type 2 diabetes mellitus without complications: Secondary | ICD-10-CM | POA: Diagnosis not present

## 2020-09-12 DIAGNOSIS — H04123 Dry eye syndrome of bilateral lacrimal glands: Secondary | ICD-10-CM | POA: Diagnosis not present

## 2020-09-12 DIAGNOSIS — M329 Systemic lupus erythematosus, unspecified: Secondary | ICD-10-CM | POA: Diagnosis not present

## 2020-09-12 LAB — HM DIABETES EYE EXAM

## 2020-09-19 ENCOUNTER — Telehealth: Payer: Medicare HMO

## 2020-09-24 ENCOUNTER — Encounter: Payer: Medicare HMO | Admitting: Obstetrics & Gynecology

## 2020-09-26 ENCOUNTER — Other Ambulatory Visit: Payer: Self-pay | Admitting: Rheumatology

## 2020-09-26 DIAGNOSIS — M0579 Rheumatoid arthritis with rheumatoid factor of multiple sites without organ or systems involvement: Secondary | ICD-10-CM

## 2020-09-26 NOTE — Telephone Encounter (Signed)
Last Visit: 08/14/2020 Next Visit: 01/08/2021 Labs: 08/14/2020 Labs are stable. She continues to be anemic. No change in therapy advised. Eye exam: 09/12/2020 WNL  Current Dose per office note 08/14/2020: Plaquenil 200 mg 1 tablet by mouth daily DX: Rheumatoid arthritis involving multiple sites with positive rheumatoid factor   Okay to refill per Dr. Corliss Skains

## 2020-09-30 ENCOUNTER — Telehealth: Payer: Self-pay

## 2020-09-30 NOTE — Chronic Care Management (AMB) (Signed)
Chronic Care Management Pharmacy Assistant   Name: Hannah [name]  MRN: [id number] DOB: 1946-06-27  Reason for Encounter: Disease State  - Diabetes Adherence Call.  Patient Questions:  1.  Have you seen any other providers since your last visit? No   2.  Any changes in your medicines or health? No      PCP : Hannah Chard, MD  Allergies:   Allergies  Allergen Reactions   Shellfish Allergy Anaphylaxis and Hives    Medications: Outpatient Encounter Medications as of 09/30/2020  Medication Sig Note   hydroxychloroquine (PLAQUENIL) 200 MG tablet TAKE 1 TABLET BY MOUTH DAILY FOR RHEUMATOID ARTHRITIS    albuterol (PROVENTIL HFA;VENTOLIN HFA) 108 (90 Base) MCG/ACT inhaler Inhale 2 puffs into the lungs every 6 (six) hours as needed for wheezing or shortness of breath.    Alcohol Swabs (ALCOHOL PADS) 70 % PADS Use as directed to check blood sugars 1 time per day dx: e11.22    allopurinol (ZYLOPRIM) 100 MG tablet TAKE 1 TABLET BY MOUTH EVERY DAY FOR GOUT    amLODipine (NORVASC) 5 MG tablet Take 1 tablet (5 mg total) by mouth daily. 1/2 tab in the am and 1 tab in the pm    aspirin EC 81 MG tablet Take 81 mg by mouth daily.    atorvastatin (LIPITOR) 80 MG tablet TAKE 1 TABLET BY MOUTH EVERY DAY    Blood Glucose Calibration (TRUE METRIX LEVEL 1) Low SOLN Use as directed to check blood sugars 1 time per day dx: e11.22    Blood Glucose Monitoring Suppl (TRUE METRIX METER) w/Device KIT Use as directed to check blood sugars 1 time per day dx: e11.22    budesonide-formoterol (SYMBICORT) 160-4.5 MCG/ACT inhaler INHALE 2 PUFFS BY MOUTH TWICE DAILY IN THE MORNING AND EVENING    Calcium Carb-Cholecalciferol (CALCIUM 600+D) 600-800 MG-UNIT TABS Take 1 tablet by mouth daily.     Cholecalciferol (VITAMIN D PO) Take 2,000 Units by mouth daily.     denosumab (PROLIA) 60 MG/ML SOSY injection Inject 60 mg into the skin every 6 (six) months.    Hypromellose (ARTIFICIAL TEARS OP) Apply  to eye as needed.    irbesartan (AVAPRO) 300 MG tablet TAKE 1 TABLET(300 MG) BY MOUTH DAILY    iron polysaccharides (NIFEREX) 150 MG capsule Take 150 mg by mouth daily. 08/15/2020: Taking 1 capsule 3x per week on Monday, Wednesday and Friday.    SYNTHROID 88 MCG tablet TAKE 1 TABLET BY MOUTH EVERY DAY MONDAY TO SATURDAY AND OFF ON SUNDAYS    tacrolimus (PROTOPIC) 0.1 % ointment Apply topically daily.    TRUE METRIX BLOOD GLUCOSE TEST test strip Use as directed to check blood sugars 1 time per day dx: e11.22    TRUEplus Lancets 33G MISC Use as directed to check blood sugars 1 time per day dx: e11.22    No facility-administered encounter medications on file as of 09/30/2020.    Current Diagnosis: Patient Active Problem List   Diagnosis Date Noted   PVC (premature ventricular contraction) 05/06/2020   Type 2 diabetes mellitus with stage 3 chronic kidney disease, without long-term current use of insulin ([place]) 03/20/2020   Palpitations 03/20/2020   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/2020   Primary hypothyroidism 03/20/2020   Vitiligo 03/20/2020   Iron deficiency anemia 08/12/2017   Osteoporosis 10/02/2016   Systemic lupus erythematosus ([place]) 10/02/2016   Rheumatoid arthritis involving multiple sites with positive rheumatoid factor ([place]) 10/02/2016   High risk medication use 10/02/2016  History of chronic kidney disease 10/02/2016   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/2016   Primary osteoarthritis of both knees 10/02/2016   Vitamin D deficiency 10/02/2016   History of diabetes mellitus 10/02/2016   History of hypertension 10/02/2016   History of asthma 10/02/2016   Absolute anemia    MGUS (monoclonal gammopathy of unknown significance)    Normocytic anemia 03/21/2015   Essential hypertension 03/20/2015   Abnormal CT of the chest 07/30/2012   Asthma 06/19/2012    Current antihyperglycemic regimen:  o None on Holiday with Metformin   o   What recent interventions/DTPs have been made to improve glycemic control:  o Patient states she has been eating healthy Hannah [name] and taking her medications as directed by provider.   Have there been any recent hospitalizations or ED visits since last visit with CPP? No   Patient denies hypoglycemic symptoms, including Pale, Sweaty, Shaky, Hungry, Nervous/irritable and Vision changes  Patient denies hyperglycemic symptoms, including blurry vision, excessive thirst, fatigue, polyuria and weakness   How often are you checking your blood sugar? Once a day    What are your blood sugars ranging?  o Fasting: 10/01/2020 81 o Before meals: None  o After meals: None o Bedtime: None  During the week, how often does your blood glucose drop below 70? No    Are you checking your feet daily/regularly? Yes. Patient has been having some swelling in her legs, states she has not swelling today.  Hammer toes , she states she is going to call Dr. Jerrilyn [name] office for an appointment.  Adherence Review: Is the patient currently on a STATIN medication? Yes Is the patient currently on ACE/ARB medication? Yes Does the patient have >5 day gap between last estimated fill dates? No   Goals Addressed            This Visit's Progress    Pharmacy Care Plan   On track    CARE PLAN ENTRY (see longitudinal plan of care for additional care plan information)  Current Barriers:   Chronic Disease Management support, education, and care coordination needs related to Hypertension, Hyperlipidemia, Diabetes, Asthma, Hypothyroidism, and Osteoporosis   Hypertension BP Readings from Last 3 Encounters:  06/27/20 (!) 144/66  06/19/20 140/72  06/19/20 140/72    Pharmacist Clinical Goal(s): o Over the next 90 days, patient will work with PharmD and providers to achieve BP goal <130/80  Current regimen:  o Amlodipine 2.7m every morning and 540mevery evening o Irbesartan 30012mdaily  Interventions: o Reviewed most recent BP readings o Reemphasized use of Mrs. Dash and salt limitation  Patient self care activities - Over the next 14 days, patient will: o Check BP daily, document, and provide at future appointments o Ensure daily salt intake < 2300 mg/day o Exercise for at least 30 minutes per day 5 days per week (150 minutes per week)  Hyperlipidemia Lab Results  Component Value Date/Time   [facility] 04:35 PM    Pharmacist Clinical Goal(s): o Over the next 90 days, patient will work with PharmD and providers to maintain LDL goal < 70  Current regimen:  o Atorvastatin 80 mg daily  Interventions: o Provided dietary and exercise recommendations o Taking medication at the same time each day  Patient self care activities - Over the next 90 days, patient will: o Take cholesterol medication daily as directed o Exercise for at least 30 minutes per day 5 days per week (150 minutes  per week)  Diabetes Lab Results  Component Value Date/Time   HGBA1C 5.5 06/19/2020 02:29 PM   HGBA1C 5.7 (H) 03/20/2020 04:45 PM    Pharmacist Clinical Goal(s): o Over the next 90 days, patient will work with PharmD and providers to maintain A1c goal <7%  Current regimen:  o No medications currently  Interventions: o Reviewed most recent A1c o Congratulated on current lifestyle modification efforts  Patient self care activities - Over the next 90 days, patient will: o Check blood sugar several times per week, document, and provide at future appointments o Contact provider with any episodes of hypoglycemia o Exercise for at least 30 minutes per day 5 days per week (150 minutes per week)  Hypothyroidism  Pharmacist Clinical Goal(s) o Over the next 90 days, patient will work with PharmD and providers to obtain Synthroid medication  Current regimen:  o Synthroid 1mg daily Monday through Saturday, skip Sundays  Interventions: o Referred to CPA for help  [facility] Patient self care activities - Over the next 90 days, patient will: o Continue taking Synthroid daily as directed o Follow up with TFelicity Coyerwith any questions regarding renewal  Medication management  Pharmacist Clinical Goal(s): o Over the next 90 days, patient will work with PharmD and providers to achieve optimal medication adherence  Current pharmacy: [facility]  Interventions o Comprehensive medication review performed. o Continue current medication management strategy  Patient self care activities - Over the next 90 days, patient will: o Focus on medication adherence by considering use of pill box to aide in organization of medication o Take medications as prescribed o Report any questions or concerns to PharmD and/or provider(s)  Please see past updates related to this goal by clicking on the "Past Updates" button in the selected goal         Follow-Up:  Pharmacist Review   VOrlando Penner CPP Notified  VJudithann Sheen CLittlefieldPharmacist Assistant [phone]

## 2020-10-08 ENCOUNTER — Other Ambulatory Visit: Payer: Self-pay | Admitting: Internal Medicine

## 2020-10-11 ENCOUNTER — Other Ambulatory Visit: Payer: Self-pay | Admitting: Internal Medicine

## 2020-10-19 ENCOUNTER — Other Ambulatory Visit: Payer: Self-pay | Admitting: Internal Medicine

## 2020-10-23 DIAGNOSIS — M9903 Segmental and somatic dysfunction of lumbar region: Secondary | ICD-10-CM | POA: Diagnosis not present

## 2020-10-23 DIAGNOSIS — M9902 Segmental and somatic dysfunction of thoracic region: Secondary | ICD-10-CM | POA: Diagnosis not present

## 2020-10-23 DIAGNOSIS — M5136 Other intervertebral disc degeneration, lumbar region: Secondary | ICD-10-CM | POA: Diagnosis not present

## 2020-10-23 DIAGNOSIS — M47814 Spondylosis without myelopathy or radiculopathy, thoracic region: Secondary | ICD-10-CM | POA: Diagnosis not present

## 2020-10-23 DIAGNOSIS — M9905 Segmental and somatic dysfunction of pelvic region: Secondary | ICD-10-CM | POA: Diagnosis not present

## 2020-10-23 DIAGNOSIS — M5137 Other intervertebral disc degeneration, lumbosacral region: Secondary | ICD-10-CM | POA: Diagnosis not present

## 2020-10-24 ENCOUNTER — Other Ambulatory Visit: Payer: Medicare HMO

## 2020-10-24 ENCOUNTER — Other Ambulatory Visit: Payer: Self-pay

## 2020-10-24 DIAGNOSIS — N183 Chronic kidney disease, stage 3 unspecified: Secondary | ICD-10-CM | POA: Diagnosis not present

## 2020-10-24 DIAGNOSIS — E1122 Type 2 diabetes mellitus with diabetic chronic kidney disease: Secondary | ICD-10-CM

## 2020-10-24 LAB — HEMOGLOBIN A1C
Est. average glucose Bld gHb Est-mCnc: 114 mg/dL
Hgb A1c MFr Bld: 5.6 % (ref 4.8–5.6)

## 2020-10-31 ENCOUNTER — Encounter: Payer: Medicare HMO | Admitting: Internal Medicine

## 2020-10-31 ENCOUNTER — Other Ambulatory Visit: Payer: Self-pay

## 2020-10-31 ENCOUNTER — Ambulatory Visit (INDEPENDENT_AMBULATORY_CARE_PROVIDER_SITE_OTHER): Payer: Medicare HMO | Admitting: Nurse Practitioner

## 2020-10-31 ENCOUNTER — Encounter: Payer: Self-pay | Admitting: Nurse Practitioner

## 2020-10-31 VITALS — BP 122/68 | HR 63 | Temp 98.3°F | Ht <= 58 in | Wt 152.6 lb

## 2020-10-31 DIAGNOSIS — M79671 Pain in right foot: Secondary | ICD-10-CM | POA: Diagnosis not present

## 2020-10-31 NOTE — Progress Notes (Signed)
Rutherford Nail as a scribe for Minette Brine, FNP.,have documented all relevant documentation on the behalf of Minette Brine, FNP,as directed by  Minette Brine, FNP while in the presence of Minette Brine, [place]. This visit occurred during the SARS-CoV-2 public health emergency.  Safety protocols were in place, including screening questions prior to the visit, additional usage of staff PPE, and extensive cleaning of exam room while observing appropriate contact time as indicated for disinfecting solutions.  Subjective:     Patient ID: Hannah [name] , female    DOB: 13-Jan-1946 , 75 y.o.   MRN: [id number]   Chief Complaint  Patient presents with  . referral    HPI  Pt here today for referral to foot doctor, she is having pain to her right foot underneath her toes and her 2nd toe on her right foot she feels is swollen. She has seen a foot doctor in the past.    Past Medical History:  Diagnosis Date  . Anemia   . Arthritis   . Asthma   . Diabetes mellitus without complication ([place])   . Gout 12/17/2014   patient reported  . Hyperlipidemia   . Hypertension   . Systemic lupus erythematosus ([facility])      Family History  Problem Relation Age of Onset  . Heart disease Father   . Diabetes Father   . Hypertension Mother   . Hypertension Sister   . Hypertension Sister   . Leukemia Sister      Current Outpatient Medications:  .  albuterol (PROVENTIL HFA;VENTOLIN HFA) 108 (90 Base) MCG/ACT inhaler, Inhale 2 puffs into the lungs every 6 (six) hours as needed for wheezing or shortness of breath., Disp: 1 Inhaler, Rfl: 2 .  Alcohol Swabs (ALCOHOL PADS) 70 % PADS, Use as directed to check blood sugars 1 time per day dx: e11.22, Disp: 150 each, Rfl: 2 .  allopurinol (ZYLOPRIM) 100 MG tablet, TAKE 1 TABLET BY MOUTH EVERY DAY FOR GOUT, Disp: 90 tablet, Rfl: 1 .  amLODipine (NORVASC) 5 MG tablet, Take 1 tablet (5 mg total) by mouth daily. 1/2 tab in the am and 1 tab in the pm, Disp: 180  tablet, Rfl: 1 .  Ascorbic Acid (VITAMIN C) 1000 MG tablet, Take 1,000 mg by mouth daily., Disp: , Rfl:  .  aspirin EC 81 MG tablet, Take 81 mg by mouth daily., Disp: , Rfl:  .  atorvastatin (LIPITOR) 80 MG tablet, TAKE 1 TABLET BY MOUTH EVERY DAY, Disp: 90 tablet, Rfl: 1 .  Blood Glucose Calibration (TRUE METRIX LEVEL 1) Low SOLN, Use as directed to check blood sugars 1 time per day dx: e11.22, Disp: 1 each, Rfl: 1 .  Blood Glucose Monitoring Suppl (TRUE METRIX METER) w/Device KIT, Use as directed to check blood sugars 1 time per day dx: e11.22, Disp: 1 kit, Rfl: 1 .  budesonide-formoterol (SYMBICORT) 160-4.5 MCG/ACT inhaler, INHALE 2 PUFFS BY MOUTH TWICE DAILY IN THE MORNING AND EVENING, Disp: 10.2 g, Rfl: 2 .  Calcium Carb-Cholecalciferol 600-800 MG-UNIT TABS, Take 1 tablet by mouth daily. , Disp: , Rfl:  .  Cholecalciferol (VITAMIN D PO), Take 2,000 Units by mouth daily. , Disp: , Rfl:  .  denosumab (PROLIA) 60 MG/ML SOSY injection, Inject 60 mg into the skin every 6 (six) months., Disp: 1 mL, Rfl: 0 .  hydroxychloroquine (PLAQUENIL) 200 MG tablet, TAKE 1 TABLET BY MOUTH DAILY FOR RHEUMATOID ARTHRITIS, Disp: 90 tablet, Rfl: 0 .  Hypromellose (ARTIFICIAL TEARS OP), Apply  to eye as needed., Disp: , Rfl:  .  irbesartan (AVAPRO) 300 MG tablet, TAKE 1 TABLET(300 MG) BY MOUTH DAILY, Disp: 90 tablet, Rfl: 2 .  iron polysaccharides (NIFEREX) 150 MG capsule, Take 150 mg by mouth daily., Disp: , Rfl:  .  SYNTHROID 88 MCG tablet, TAKE 1 TABLET BY MOUTH EVERY DAY MONDAY TO SATURDAY AND OFF ON SUNDAYS, Disp: 90 tablet, Rfl: 0 .  tacrolimus (PROTOPIC) 0.1 % ointment, Apply topically daily., Disp: 30 g, Rfl: 6 .  TRUE METRIX BLOOD GLUCOSE TEST test strip, Use as directed to check blood sugars 1 time per day dx: e11.22, Disp: 150 each, Rfl: 2 .  TRUEplus Lancets 33G MISC, Use as directed to check blood sugars 1 time per day dx: e11.22, Disp: 200 each, Rfl: 2   Allergies  Allergen Reactions  . Shellfish  Allergy Anaphylaxis and Hives     Review of Systems  Constitutional: Negative.  Negative for fatigue.  HENT: Negative.   Endocrine: Negative for polydipsia, polyphagia and polyuria.  Musculoskeletal: Negative.        Right foot pain to bottom of foot near ball area and reports swelling to her right 2nd toe.  Skin: Negative.   Neurological: Negative for dizziness and headaches.  Psychiatric/Behavioral: Negative.      Today's Vitals   10/31/20 1059  BP: 122/68  Pulse: 63  Temp: 98.3 F (36.8 C)  TempSrc: Oral  Weight: 152 lb 9.6 oz (69.2 kg)  Height: 4' 9.4" (1.458 m)   Body mass index is 32.56 kg/m.  Wt Readings from Last 3 Encounters:  10/31/20 152 lb 9.6 oz (69.2 kg)  08/14/20 150 lb (68 kg)  06/27/20 152 lb 14.4 oz (69.4 kg)   Objective:  Physical Exam Vitals reviewed.  Constitutional:      General: She is not in acute distress.    Appearance: She is obese.  Pulmonary:     Effort: Pulmonary effort is normal. No respiratory distress.  Musculoskeletal:        General: Tenderness (right foot plantar surface at ball of foot, she has possible callus to anterior area or 2nd metatarsal) present.  Neurological:     General: No focal deficit present.     Mental Status: She is alert.  Psychiatric:        Mood and Affect: Mood normal.        Behavior: Behavior normal.        Thought Content: Thought content normal.        Judgment: Judgment normal.         Assessment And Plan:     1. Right foot pain Pain to right foot to ball of foot, will refer to podiatrist would like to go to Dr. Paulla Dolly - Ambulatory referral to Podiatry     Patient was given opportunity to ask questions. Patient verbalized understanding of the plan and was able to repeat key elements of the plan. All questions were answered to their satisfaction.  Minette Brine, FNP   I, Minette Brine, FNP, have reviewed all documentation for this visit. The documentation on 10/31/20 for the exam, diagnosis,  procedures, and orders are all accurate and complete.  THE PATIENT IS ENCOURAGED TO PRACTICE SOCIAL DISTANCING DUE TO THE COVID-19 PANDEMIC.

## 2020-11-01 ENCOUNTER — Telehealth: Payer: Medicare HMO

## 2020-11-01 ENCOUNTER — Ambulatory Visit (INDEPENDENT_AMBULATORY_CARE_PROVIDER_SITE_OTHER): Payer: Medicare HMO

## 2020-11-01 DIAGNOSIS — M0579 Rheumatoid arthritis with rheumatoid factor of multiple sites without organ or systems involvement: Secondary | ICD-10-CM

## 2020-11-01 DIAGNOSIS — N183 Chronic kidney disease, stage 3 unspecified: Secondary | ICD-10-CM

## 2020-11-01 DIAGNOSIS — D508 Other iron deficiency anemias: Secondary | ICD-10-CM

## 2020-11-01 DIAGNOSIS — I129 Hypertensive chronic kidney disease with stage 1 through stage 4 chronic kidney disease, or unspecified chronic kidney disease: Secondary | ICD-10-CM

## 2020-11-01 DIAGNOSIS — E1122 Type 2 diabetes mellitus with diabetic chronic kidney disease: Secondary | ICD-10-CM

## 2020-11-04 ENCOUNTER — Other Ambulatory Visit: Payer: Self-pay

## 2020-11-04 MED ORDER — ACCU-CHEK GUIDE VI STRP: ORAL_STRIP | 2 refills | Status: DC

## 2020-11-04 MED ORDER — ACCU-CHEK SOFTCLIX LANCETS MISC: 2 refills | Status: AC

## 2020-11-04 NOTE — Patient Instructions (Signed)
Goals Addressed      Other   .  Anemia Managed   On track     Timeframe:  Long-Range Goal Priority:  Medium Start Date:  11/01/20                           Expected End Date:  05/01/21  Next Follow up date: 01/13/21 Over the next 180 days, patient will:  . Continue to self administers medications as prescribed . Continue to attend all scheduled provider appointments . Continue to call pharmacy for medication refills . Perform ADL's independently . Perform IADL's independently . Call provider office for new concerns or questions

## 2020-11-04 NOTE — Chronic Care Management (AMB) (Signed)
Chronic Care Management   CCM RN Visit Note  11/01/2020 Name: Hannah [name] MRN: [id number] DOB: [birth year]  Subjective: Hannah [name] is a 75 y.o. year old female who is a primary care patient of Glendale Chard, MD. The care management team was consulted for assistance with disease management and care coordination needs.    Engaged with patient by telephone for follow up visit in response to provider referral for case management and/or care coordination services.   Consent to Services:  The patient was given information about Chronic Care Management services, agreed to services, and gave verbal consent prior to initiation of services.  Please see initial visit note for detailed documentation.   Patient agreed to services and verbal consent obtained.   Assessment: Review of patient past medical history, allergies, medications, health status, including review of consultants reports, laboratory and other test data, was performed as part of comprehensive evaluation and provision of chronic care management services.   SDOH (Social Determinants of Health) assessments and interventions performed:  Yes, no acute challenges   CCM Care Plan  Allergies  Allergen Reactions  . Shellfish Allergy Anaphylaxis and Hives    Outpatient Encounter Medications as of 11/01/2020  Medication Sig  . albuterol (PROVENTIL HFA;VENTOLIN HFA) 108 (90 Base) MCG/ACT inhaler Inhale 2 puffs into the lungs every 6 (six) hours as needed for wheezing or shortness of breath.  . allopurinol (ZYLOPRIM) 100 MG tablet TAKE 1 TABLET BY MOUTH EVERY DAY FOR GOUT  . amLODipine (NORVASC) 5 MG tablet Take 1 tablet (5 mg total) by mouth daily. 1/2 tab in the am and 1 tab in the pm  . Ascorbic Acid (VITAMIN C) 1000 MG tablet Take 1,000 mg by mouth daily.  [name] aspirin EC 81 MG tablet Take 81 mg by mouth daily.  [name] atorvastatin (LIPITOR) 80 MG tablet TAKE 1 TABLET BY MOUTH EVERY DAY  . Blood Glucose Calibration (TRUE METRIX  LEVEL 1) Low SOLN Use as directed to check blood sugars 1 time per day dx: e11.22  . Blood Glucose Monitoring Suppl (TRUE METRIX METER) w/Device KIT Use as directed to check blood sugars 1 time per day dx: e11.22  . budesonide-formoterol (SYMBICORT) 160-4.5 MCG/ACT inhaler INHALE 2 PUFFS BY MOUTH TWICE DAILY IN THE MORNING AND EVENING (Patient taking differently: INHALE 2 PUFFS BY MOUTH TWICE DAILY IN THE MORNING AND EVENING AS NEEDED)  . Calcium Carb-Cholecalciferol 600-800 MG-UNIT TABS Take 1 tablet by mouth daily.   . Cholecalciferol (VITAMIN D PO) Take 2,000 Units by mouth daily.   [name] denosumab (PROLIA) 60 MG/ML SOSY injection Inject 60 mg into the skin every 6 (six) months.  [name] glucose blood test strip 1 each by Other route as needed for other. Use as instructed  . hydroxychloroquine (PLAQUENIL) 200 MG tablet TAKE 1 TABLET BY MOUTH DAILY FOR RHEUMATOID ARTHRITIS  . Hypromellose (ARTIFICIAL TEARS OP) Apply to eye as needed.  . irbesartan (AVAPRO) 300 MG tablet TAKE 1 TABLET(300 MG) BY MOUTH DAILY  . iron polysaccharides (NIFEREX) 150 MG capsule Take 150 mg by mouth daily.  [name] SYNTHROID 88 MCG tablet TAKE 1 TABLET BY MOUTH EVERY DAY MONDAY TO SATURDAY AND OFF ON SUNDAYS  . tacrolimus (PROTOPIC) 0.1 % ointment Apply topically daily.  [name] METRIX BLOOD GLUCOSE TEST test strip Use as directed to check blood sugars 1 time per day dx: e11.22  . TRUEplus Lancets 33G MISC Use as directed to check blood sugars 1 time per day dx: e11.22  . Alcohol  Swabs (ALCOHOL PADS) 70 % PADS Use as directed to check blood sugars 1 time per day dx: e11.22 (Patient not taking: Reported on 11/01/2020)   No facility-administered encounter medications on file as of 11/01/2020.    Patient Active Problem List   Diagnosis Date Noted  . PVC (premature ventricular contraction) 05/06/2020  . Type 2 diabetes mellitus with stage 3 chronic kidney disease, without long-term current use of insulin ([place]) 03/20/2020  . Palpitations  03/20/2020  . 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/2020  . Primary hypothyroidism 03/20/2020  . Vitiligo 03/20/2020  . Iron deficiency anemia 08/12/2017  . Osteoporosis 10/02/2016  . Systemic lupus erythematosus ([place]) 10/02/2016  . Rheumatoid arthritis involving multiple sites with positive rheumatoid factor ([place]) 10/02/2016  . High risk medication use 10/02/2016  . History of chronic kidney disease 10/02/2016  . 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/2016  . Primary osteoarthritis of both knees 10/02/2016  . Vitamin D deficiency 10/02/2016  . History of diabetes mellitus 10/02/2016  . History of hypertension 10/02/2016  . History of asthma 10/02/2016  . Absolute anemia   . MGUS (monoclonal gammopathy of unknown significance)   . Normocytic anemia 03/21/2015  . Essential hypertension 03/20/2015  . Abnormal CT of the chest 07/30/2012  . Asthma 06/19/2012    Conditions to be addressed/monitored:CKD III, hypertensive nephropathy, DM, RA, iron deficiency Anemia    Care Plan : Anemia  Updates made by Lynne Logan, RN since 11/04/2020 12:00 AM    Problem: Anemia Managed   Priority: Medium    Long-Range Goal: Anemia Managed   Start Date: 11/01/2020  Expected End Date: 05/01/2021  This Visit's Progress: On track  Priority: Medium  Note:   CARE PLAN ENTRY (see longtitudinal plan of care for additional care plan information)  Current Barriers:  [name] Knowledge Deficits related to disease process and Self Health management of Anemia . Chronic Disease Management support and education needs related to CKDIII, Hypertensive Nephropathy, DM  Nurse Case Manager Clinical Goal(s):  [name] Over the next 180 days, patient will work with the CCM team and healthcare provider to address needs related to disease education and support to improve Self Health management of Anemia CCM RN CM Interventions:  11/01/20 Successful Call completed with patient  . Evaluation of current treatment plan  related to Anemia and patient's adherence to plan as established by provider . Determined patient continues to have this condition managed by Dr. Irene Limbo with last OV completed on 06/27/20 with the following Assessment/Plan discussed;  o ASSESSMENT & PLAN:  o 75 year old of an American female with o  #1 IgG kappa monoclonal gammopathy of undetermined significance. Bone survey X-ray showed no lytic lesions. M spike is 0.6 g/dL in 10/016 and then 0.5g/dl in 10/2015. 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 o Bone marrow biopsy does show about 13% plasma cells however these appear to be polyclonal and likely related to a possible underlying inflammatory disorder. o Less likely a biclonal plasma cell dyscrasia  o #2 Normocytic normochromic anemia likely related to chronic inflammation from recent diagnosis of lupus/rheumatoid arthritis + CKD.  o Increased acanthocytes on peripheral blood smear -no overt evidence of liver disease. Could be a marker of some MDS o Bone marrow biopsy did not show overt plasma cell dyscrasia or myelodysplastic syndrome. 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 o Bone marrow biopsy showed decreased iron stores. o Ferritin and iron have been trending down, she has not noticed any issues with bruising or bleeding recently. ?absorption issue. I have given her the option of IV iron which she has previously had without issue. She would like to have this.  o PLAN: o -Discussed pt labwork today, 06/27/20; no anemia, blood chemistries are steady, Sat Ratios are okay, Ferritin at goal - Goal Ferritin >100 o -No lab or clinical evidence of MGUS progression at this time.  o -Recommend pt continue 150 mg Iron Polysaccharide three days per week (M,W,F) o -Recommend pt f/u with Dr. Estanislado Pandy for management of her autoimmune  condition. o -Recommend pt continue f/u with Dermatology for Vitiligo o -Will see back in 12 months with labs o FOLLOW UP: o RTC with Dr Irene Limbo in 12 months with labs . Discussed plans with patient for ongoing care management follow up and provided patient with direct contact information for care management team Patient Self Care Activities:  Over the next 180 days, patient will:  . Continue to self administers medications as prescribed . Continue to attend all scheduled provider appointments . Continue to call pharmacy for medication refills . Perform ADL's independently . Perform IADL's independently . Call provider office for new concerns or questions Next Follow up date:  01/13/21     Plan:Telephone follow up appointment with care management team member scheduled for:  01/13/21  Barb Merino, RN, BSN, CCM Care Management Coordinator [facility]  Direct Phone: [phone]

## 2020-11-05 ENCOUNTER — Other Ambulatory Visit: Payer: Self-pay

## 2020-11-05 ENCOUNTER — Telehealth: Payer: Self-pay

## 2020-11-05 NOTE — Progress Notes (Signed)
11/05/20-Notified the patient of upcoming CCM Call appointment 11/06/20 at 3:30 pm with [name], CPP. Made the patient aware to have medications and supplements near during phone visit. The patient verbalized understanding.  [name], CPP Notified.  Raynelle Highland, [place] Pharmacist Assistant [phone]

## 2020-11-06 ENCOUNTER — Ambulatory Visit: Payer: Self-pay

## 2020-11-06 ENCOUNTER — Telehealth: Payer: Self-pay | Admitting: Pharmacy Technician

## 2020-11-06 DIAGNOSIS — I129 Hypertensive chronic kidney disease with stage 1 through stage 4 chronic kidney disease, or unspecified chronic kidney disease: Secondary | ICD-10-CM

## 2020-11-06 DIAGNOSIS — M0579 Rheumatoid arthritis with rheumatoid factor of multiple sites without organ or systems involvement: Secondary | ICD-10-CM

## 2020-11-06 DIAGNOSIS — D508 Other iron deficiency anemias: Secondary | ICD-10-CM

## 2020-11-06 DIAGNOSIS — I10 Essential (primary) hypertension: Secondary | ICD-10-CM

## 2020-11-06 DIAGNOSIS — N183 Chronic kidney disease, stage 3 unspecified: Secondary | ICD-10-CM | POA: Diagnosis not present

## 2020-11-06 DIAGNOSIS — E1122 Type 2 diabetes mellitus with diabetic chronic kidney disease: Secondary | ICD-10-CM

## 2020-11-06 NOTE — Progress Notes (Signed)
Chronic Care Management Pharmacy Note  11/21/2020 Name:  Hannah [name] MRN:  [id number] DOB:  [birth year]  Subjective: Hannah [name] is an 75 y.o. year old female who is a primary patient of Glendale Chard, MD.  The CCM team was consulted for assistance with disease management and care coordination needs.    Engaged with patient by telephone for follow up visit in response to provider referral for pharmacy case management and/or care coordination services. Patient has an appointment with the podiatrist due to her hammer toes. She also reports that she has been going to [facility] for her knees. She would like to ask Dr. Baird Cancer about whether or not she should have hammer toe surgery, or knee surgery.   Consent to Services:  The patient was given the following information about Chronic Care Management services today, agreed to services, and gave verbal consent: 1. CCM service includes personalized support from designated clinical staff supervised by the primary care provider, including individualized plan of care and coordination with other care providers 2. 24/7 contact phone numbers for assistance for urgent and routine care needs. 3. Service will only be billed when office clinical staff spend 20 minutes or more in a month to coordinate care. 4. Only one practitioner may furnish and bill the service in a calendar month. 5.The patient may stop CCM services at any time (effective at the end of the month) by phone call to the office staff. 6. The patient will be responsible for cost sharing (co-pay) of up to 20% of the service fee (after annual deductible is met). Patient agreed to services and consent obtained.  Patient Care Team: Glendale Chard, MD as PCP - General (Internal Medicine) Nahser, Wonda Cheng, MD as PCP - Cardiology (Cardiology) Rex Kras Claudette Stapler, RN as [place], [place] (Inactive) (Pharmacist)  Recent office visits: 11/01/2020 [facility]    Recent consult visits: 08/14/2020 [facility] visits: None in previous 6 months  Objective:  Lab Results  Component Value Date   CREATININE 1.00 (H) 08/14/2020   BUN 14 08/14/2020   GFRNONAA 55 (L) 08/14/2020   GFRAA 64 08/14/2020   NA 141 08/14/2020   K 4.3 08/14/2020   CALCIUM 10.1 08/14/2020   CO2 27 08/14/2020    Lab Results  Component Value Date/Time   HGBA1C 5.6 10/24/2020 11:05 AM   HGBA1C 5.5 06/19/2020 02:29 PM   MICROALBUR 80 10/31/2019 12:13 PM   MICROALBUR 80 06/15/2019 06:11 PM    Last diabetic Eye exam:  Lab Results  Component Value Date/Time   HMDIABEYEEXA No Retinopathy 09/12/2020 12:00 AM    Last diabetic Foot exam: No results found for: HMDIABFOOTEX   Lab Results  Component Value Date   CHOL 134 10/31/2019   HDL 67 10/31/2019   LDLCALC 53 10/31/2019   TRIG 70 10/31/2019   CHOLHDL 2.0 10/31/2019    Hepatic Function Latest Ref Rng & Units 08/14/2020 06/19/2020 05/17/2020  Total Protein 6.1 - 8.1 g/dL 6.1 7.2 6.2  Albumin 3.7 - 4.7 g/dL - 4.5 -  AST 10 - 35 U/L 15 20 19   ALT 6 - 29 U/L 8 14 16   Alk Phosphatase 44 - 121 IU/L - 53 -  Total Bilirubin 0.2 - 1.2 mg/dL 0.5 0.7 0.6    Lab Results  Component Value Date/Time   TSH 1.140 03/20/2020 04:45 PM   TSH 0.498 10/31/2019 04:39 PM   FREET4 1.52 03/20/2020 04:45 PM   FREET4 1.53 10/31/2019  04:39 PM    CBC Latest Ref Rng & Units 08/14/2020 06/19/2020 05/17/2020  WBC 3.8 - 10.8 Thousand/uL 4.8 6.1 4.9  Hemoglobin 11.7 - 15.5 g/dL 10.4(L) 11.1 10.5(L)  Hematocrit 35.0 - 45.0 % 31.6(L) 33.7(L) 31.6(L)  Platelets 140 - 400 Thousand/uL 180 200 167    Lab Results  Component Value Date/Time   VD25OH 41 11/14/2019 01:17 PM   VD25OH 44 05/16/2019 10:12 AM    Clinical ASCVD: No  The 10-year ASCVD risk score Mikey Bussing DC Jr., et al., 2013) is: 25.1%   Values used to calculate the score:     Age: 89 years     Sex: Female     Is Non-Hispanic African American: Yes     Diabetic:  Yes     Tobacco smoker: No     Systolic Blood Pressure: 937 mmHg     Is BP treated: Yes     HDL Cholesterol: 67 mg/dL     Total Cholesterol: 134 mg/dL    Depression screen [facility] 2/9 06/19/2020 06/19/2020 06/15/2019  Decreased Interest 0 0 0  Down, Depressed, Hopeless 0 0 0  PHQ - 2 Score 0 0 0  Altered sleeping - - 3  Tired, decreased energy - - 3  Change in appetite - - 0  Feeling bad or failure about yourself  - - 0  Trouble concentrating - - 0  Moving slowly or fidgety/restless - - 0  Suicidal thoughts - - 0  PHQ-9 Score - - 6  Difficult doing work/chores - - Not difficult at all  Some recent data might be hidden      Social History   Tobacco Use  Smoking Status Never Smoker  Smokeless Tobacco Never Used   BP Readings from Last 3 Encounters:  11/08/20 126/70  10/31/20 122/68  08/14/20 138/78   Pulse Readings from Last 3 Encounters:  10/31/20 63  08/14/20 62  06/27/20 73   Wt Readings from Last 3 Encounters:  11/08/20 153 lb (69.4 kg)  10/31/20 152 lb 9.6 oz (69.2 kg)  08/14/20 150 lb (68 kg)    Assessment/Interventions: Review of patient past medical history, allergies, medications, health status, including review of consultants reports, laboratory and other test data, was performed as part of comprehensive evaluation and provision of chronic care management services.   SDOH:  (Social Determinants of Health) assessments and interventions performed: No   CCM Care Plan  Allergies  Allergen Reactions  . Shellfish Allergy Anaphylaxis and Hives    Medications Reviewed Today    Reviewed by Wallene Huh, DPM (Physician) on 11/11/20 at 49  Med List Status: <None>  Medication Order Taking? Sig Documenting Provider Last Dose Status Informant  Accu-Chek Softclix Lancets lancets [id number] No Use to check blood sugars daily E11.69 Glendale Chard, MD Taking Active   albuterol (PROVENTIL HFA;VENTOLIN HFA) 108 (90 Base) MCG/ACT inhaler [id number] No Inhale 2 puffs into  the lungs every 6 (six) hours as needed for wheezing or shortness of breath. Minette Brine, FNP Taking Active   Alcohol Swabs (ALCOHOL PADS) 70 % PADS [id number] No Use as directed to check blood sugars 1 time per day dx: e11.22 Glendale Chard, MD Taking Active   allopurinol (ZYLOPRIM) 100 MG tablet [id number] No TAKE 1 TABLET BY MOUTH EVERY DAY FOR GOUT Glendale Chard, MD Taking Active   amLODipine (NORVASC) 5 MG tablet [id number] No Take 1 tablet (5 mg total) by mouth daily. 1/2 tab in the am and 1 tab in the pm Baird Cancer,  Bailey Mech, MD Taking Active   Ascorbic Acid (VITAMIN C) 1000 MG tablet [id number] No Take 1,000 mg by mouth daily. [provider] Taking Active   aspirin EC 81 MG tablet [id number] No Take 81 mg by mouth daily. [provider] Taking Active Self  atorvastatin (LIPITOR) 80 MG tablet [id number] No TAKE 1 TABLET BY MOUTH EVERY DAY Glendale Chard, MD Taking Active   Blood Glucose Calibration (TRUE METRIX LEVEL 1) Low SOLN [id number] No Use as directed to check blood sugars 1 time per day dx: e11.22 Glendale Chard, MD Taking Active   Blood Glucose Monitoring Suppl (TRUE METRIX METER) w/Device KIT [id number] No Use as directed to check blood sugars 1 time per day dx: e11.22 Glendale Chard, MD Taking Active   budesonide-formoterol [facility]) 160-4.5 MCG/ACT inhaler [id number] No INHALE 2 PUFFS BY MOUTH TWICE DAILY IN THE MORNING AND EVENING  Patient taking differently: INHALE 2 PUFFS BY MOUTH TWICE DAILY IN THE MORNING AND EVENING AS NEEDED   Glendale Chard, MD Taking Active   Calcium Carb-Cholecalciferol 600-800 MG-UNIT TABS [id number] No Take 1 tablet by mouth daily.  [provider] Taking Active   Cholecalciferol (VITAMIN D PO) [id number] No Take 2,000 Units by mouth daily.  [provider] Taking Active   denosumab (PROLIA) 60 MG/ML SOSY injection [id number] No Inject 60 mg into the skin every 6 (six) months. Yopp, Amber C, RPH-CPP Taking Active   glucose blood  (ACCU-CHEK GUIDE) test strip [id number] No Use to check blood sugars daily E11.69 Glendale Chard, MD Taking Active   glucose blood test strip [id number] No 1 each by Other route as needed for other. Use as instructed [provider] Taking Active   hydroxychloroquine (PLAQUENIL) 200 MG tablet [id number] No TAKE 1 TABLET BY MOUTH DAILY FOR RHEUMATOID ARTHRITIS Bo Merino, MD Taking Active   Hypromellose (ARTIFICIAL TEARS OP) [id number] No Apply to eye as needed. [provider] Taking Active   irbesartan (AVAPRO) 300 MG tablet [id number] No TAKE 1 TABLET(300 MG) BY MOUTH DAILY Glendale Chard, MD Taking Active   iron polysaccharides (NIFEREX) 150 MG capsule [id number] No Take 150 mg by mouth daily. [provider] Taking Active            Med Note (LITTLE, ANGEL L   Fri Nov 01, 2020  1:05 PM)    SYNTHROID 88 MCG tablet [id number] No TAKE 1 TABLET BY MOUTH EVERY DAY MONDAY TO SATURDAY AND OFF ON Nemiah Commander, MD Taking Active   tacrolimus (PROTOPIC) 0.1 % ointment [id number] No Apply topically daily. Robyne Askew R, PA-C Taking Active   TRUE METRIX BLOOD GLUCOSE TEST test strip [id number] No Use as directed to check blood sugars 1 time per day dx: e11.22 Glendale Chard, MD Taking Active   TRUEplus Lancets 33G MISC [id number] No Use as directed to check blood sugars 1 time per day dx: e11.22 Glendale Chard, MD Taking Active           Patient Active Problem List   Diagnosis Date Noted  . PVC (premature ventricular contraction) 05/06/2020  . Type 2 diabetes mellitus with stage 3 chronic kidney disease, without long-term current use of insulin ([place]) 03/20/2020  . Palpitations 03/20/2020  . 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/2020  . Primary hypothyroidism 03/20/2020  . Vitiligo 03/20/2020  . Iron deficiency anemia 08/12/2017  . Osteoporosis 10/02/2016  . Systemic lupus erythematosus ([place]) 10/02/2016  . Rheumatoid arthritis involving multiple sites with positive  rheumatoid factor ([place]) 10/02/2016  . High risk medication use  10/02/2016  . History of chronic kidney disease 10/02/2016  . 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/2016  . Primary osteoarthritis of both knees 10/02/2016  . Vitamin D deficiency 10/02/2016  . History of diabetes mellitus 10/02/2016  . History of hypertension 10/02/2016  . History of asthma 10/02/2016  . Absolute anemia   . MGUS (monoclonal gammopathy of unknown significance)   . Normocytic anemia 03/21/2015  . Essential hypertension 03/20/2015  . Abnormal CT of the chest 07/30/2012  . Asthma 06/19/2012    Immunization History  Administered Date(s) Administered  . 19-influenza Whole 07/15/2012  . DTaP 06/18/2019  . Fluad Quad(high Dose 65+) 06/19/2020  . Influenza Split 06/28/2014  . Influenza, High Dose Seasonal PF 05/18/2019  . Influenza,inj,Quad PF,6+ Mos 06/20/2018  . Moderna Sars-Covid-2 Vaccination 11/20/2019, 12/19/2019, 08/06/2020  . Pneumococcal Conjugate-13 05/04/2018  . Pneumococcal Polysaccharide-23 05/18/2019  . Pneumococcal-Unspecified 06/28/2014  . Tdap 06/15/2019    Conditions to be addressed/monitored:  Hypertension, Hyperlipidemia, Diabetes and Rheumatoid Arthritis and CKD Stage 3  Care Plan : [place]  Updates made by Mayford Knife, RPH since 11/21/2020 12:00 AM    Problem: HTN, HLD, DM, Rheumatoid Arthritis   Priority: High    Long-Range Goal: Disease Management   This Visit's Progress: On track  Priority: High  Note:    Current Barriers:  . Continue to manage glucose levels and take medication on a routine schedule.   Pharmacist Clinical Goal(s):  [name] Over the next 90 days, patient will achieve adherence to monitoring guidelines and medication adherence to achieve therapeutic efficacy through collaboration with PharmD and provider.   Interventions: . 1:1 collaboration with Glendale Chard, MD regarding development and update of comprehensive  plan of care as evidenced by provider attestation and co-signature . Inter-disciplinary care team collaboration (see longitudinal plan of care) . Comprehensive medication review performed; medication list updated in electronic medical record  Hypertension (BP goal <130/80) -controlled -Current treatment: . Amlodipine 5 mg - taking a 1/2 tablet in the morning and a full tablet in the evening  . Irbesartan 300 mg take 1 tablet daily   -Current home readings: checking her BP at home- 133/74, pulse: 70  -Current dietary habits: mostly vegetables and protein -Current exercise habits: Monday, Tuesday and Friday exercise classes. On Friday she takes two classes.  -Denies hypotensive/hypertensive symptoms -Educated on BP goals and benefits of medications for prevention of heart attack, stroke and kidney damage; Daily salt intake goal < 2300 mg; Proper BP monitoring technique; -Counseled to monitor BP at home at least twice per week, document, and provide log at future appointments -Recommended to continue current medication  Hyperlipidemia: (LDL goal < 70) -controlled -Current treatment: . Atorvastatin 80 mg - tablet daily . Aspirin 81 mg taking daily    -Current exercise habits: Monday, Tuesday and Friday exercise classes. On Friday she takes two classes.  -Educated on Importance of limiting foods high in cholesterol; -Recommended to continue current medication  Diabetes (A1c goal <7%) -controlled -Current medications: . None at this time  -Current home glucose readings - checking everyday  . fasting glucose: 77-83  -Denies hypoglycemic/hyperglycemic symptoms -Current meal patterns:  . breakfast: yogurt, or a bowl of grapefruit, winter cream of wheat with milk, wheat type of cereal   . Dinner- last meal at 2 or 3: Eating more salmon, and asparagus - already prepared at [employer] , eating a lot of chicken and sometimes pork  o She tries to make sure that  she digests her food because she  goes to bed early  . drinks: water  -Current exercise:  Monday, Tuesday and Friday exercise classes. On Friday she takes two classes.  -Educated onA1c and blood sugar goals; Complications of diabetes including kidney damage, retinal damage, and cardiovascular disease; Exercise goal of 150 minutes per week; Prevention and management of hypoglycemic episodes; Benefits of routine self-monitoring of blood sugar; -Counseled to check feet daily and get yearly eye exams -Counseled on diet and exercise extensively  Rheumatoid Arthritis/ Lupus  (Goal: To reduce symptoms of Rheumatoid Arthritis and Lupus) -controlled -Current treatment  . Hydroxychloroquin 200 mg - take 1 tablet by mouth daily  . Allopurinol 100 mg tablet daily  -Patient seen by rheumatologist on regular basis -Recommended to continue current medication   Patient Goals/Self-Care Activities . Over the next 90 days, patient will:  - take medications as prescribed  Follow Up Plan: Telephone follow up appointment with care management team member scheduled for: The patient has been provided with contact information for the care management team and has been advised to call with any health related questions or concerns.       Medication Assistance: Application for Prolia   medication assistance program. in process.  Anticipated assistance start date by March 2022.  See plan of care for additional detail.  Patient's preferred pharmacy is:  [id number] [place], [place] - [place] [address] AT [place] [address] Phone: [phone] Fax: [place], [place] [address] [place] [address] Phone: [phone] Fax: [place] [address], [place] [address] Phone: [phone] Fax:  [phone]  Uses pill box? Yes Pt endorses 100% compliance  We discussed: Benefits of medication synchronization, packaging and delivery as well as enhanced pharmacist oversight with [employer]. Patient decided to: Continue current medication management strategy  Care Plan and Follow Up Patient Decision:  Patient agrees to Care Plan and Follow-up.  Plan: Telephone follow up appointment with care management team member scheduled for:  02/05/2021 and The patient has been provided with contact information for the care management team and has been advised to call with any health related questions or concerns.   [name], PharmD Clinical Pharmacist [facility] [phone]

## 2020-11-06 NOTE — Telephone Encounter (Signed)
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 Coverage dates are 10/07/20 through 10/06/21.  Pharmacy Card [id number] [phone] [id number]

## 2020-11-08 ENCOUNTER — Ambulatory Visit: Payer: Medicare HMO | Admitting: Obstetrics & Gynecology

## 2020-11-08 ENCOUNTER — Other Ambulatory Visit: Payer: Self-pay

## 2020-11-08 ENCOUNTER — Encounter: Payer: Self-pay | Admitting: Obstetrics & Gynecology

## 2020-11-08 VITALS — BP 126/70 | Ht <= 58 in | Wt 153.0 lb

## 2020-11-08 DIAGNOSIS — Z780 Asymptomatic menopausal state: Secondary | ICD-10-CM

## 2020-11-08 DIAGNOSIS — Z01419 Encounter for gynecological examination (general) (routine) without abnormal findings: Secondary | ICD-10-CM

## 2020-11-08 DIAGNOSIS — M810 Age-related osteoporosis without current pathological fracture: Secondary | ICD-10-CM

## 2020-11-08 DIAGNOSIS — E6609 Other obesity due to excess calories: Secondary | ICD-10-CM

## 2020-11-08 DIAGNOSIS — Z6833 Body mass index (BMI) 33.0-33.9, adult: Secondary | ICD-10-CM

## 2020-11-08 NOTE — Progress Notes (Signed)
Hannah [name] July 19, 1946 [id number]   History:    75 y.o. G1P1L0 Single. Very active with her sister at [facility].  RP: Established patient presentingfor annual gyn exam   HPI: Postmenopause. No HRT. No PMB. No pelvic pain. Breasts wnl. Urine/BMs wnl.BMI 33.11.  Joined weight watcher. Exercising regularly at the [facility].  Will probably need a Rt and then Lt knee replacement, doing gel injections currently. Chronic Anemia, seen by Hemato, taking PO Iron. Dermato followed for Vitiligo. Fam MD Dr Baird Cancer for DM, cHTN, Asthma.  Past medical history,surgical history, family history and social history were all reviewed and documented in the EPIC chart.  Gynecologic History No LMP recorded. Patient is postmenopausal.  Obstetric History OB History  Gravida Para Term Preterm AB Living  1 1       0  SAB IAB Ectopic Multiple Live Births               # Outcome Date GA Lbr Len/2nd Weight Sex Delivery Anes PTL Lv  1 Para             Obstetric Comments  Baby died from CHF     ROS: A ROS was performed and pertinent positives and negatives are included in the history.  GENERAL: No fevers or chills. HEENT: No change in vision, no earache, sore throat or sinus congestion. NECK: No pain or stiffness. CARDIOVASCULAR: No chest pain or pressure. No palpitations. PULMONARY: No shortness of breath, cough or wheeze. GASTROINTESTINAL: No abdominal pain, nausea, vomiting or diarrhea, melena or bright red blood per rectum. GENITOURINARY: No urinary frequency, urgency, hesitancy or dysuria. MUSCULOSKELETAL: No joint or muscle pain, no back pain, no recent trauma. DERMATOLOGIC: No rash, no itching, no lesions. ENDOCRINE: No polyuria, polydipsia, no heat or cold intolerance. No recent change in weight. HEMATOLOGICAL: No anemia or easy bruising or bleeding. NEUROLOGIC: No headache, seizures, numbness, tingling or weakness. PSYCHIATRIC: No depression, no loss of interest in normal activity or change in  sleep pattern.     Exam:   BP 126/70 (BP Location: Right Arm, Patient Position: Sitting, Cuff Size: Normal)   Ht 4\' 9"  (1.448 m)   Wt 153 lb (69.4 kg)   BMI 33.11 kg/m   Body mass index is 33.11 kg/m.  General appearance : Well developed well nourished female. No acute distress HEENT: Eyes: no retinal hemorrhage or exudates,  Neck supple, trachea midline, no carotid bruits, no thyroidmegaly Lungs: Clear to auscultation, no rhonchi or wheezes, or rib retractions  Heart: Regular rate and rhythm, no murmurs or gallops Breast:Examined in sitting and supine position were symmetrical in appearance, no palpable masses or tenderness,  no skin retraction, no nipple inversion, no nipple discharge, no skin discoloration, no axillary or supraclavicular lymphadenopathy Abdomen: no palpable masses or tenderness, no rebound or guarding Extremities: no edema or skin discoloration or tenderness  Pelvic: Vulva: Normal             Vagina: No gross lesions or discharge  Cervix: No gross lesions or discharge  Uterus  AV, normal size, shape and consistency, non-tender and mobile  Adnexa  Without masses or tenderness  Anus: Normal   Assessment/Plan:  75 y.o. female for annual exam   1. Well female exam with routine gynecological exam Normal gynecologic exam in menopause.  Pap Neg in 2020, no indication to repeat a Pap at this time.  Breast exam normal.  Screening mammo 07/2020 Neg.  [place] 2014 normal.  Health labs with Fam MD.  2. Postmenopausal Well on no HRT.  No PMB.  3. Age-related osteoporosis without current pathological fracture Improved on Prolia.  07/2020 Osteopenia T-Score -2.4.  Continue with Prolia, Vit D, Ca++ 1500 mg daily and regular weightbearing physical activities.  4. Class 1 obesity due to excess calories with serious comorbidity and body mass index (BMI) of 33.0 to 33.9 in adult Lower Calorie/carb diet with Weight Watcher.  Continue with fitness.  Princess Bruins MD,  9:14 AM 11/08/2020

## 2020-11-11 ENCOUNTER — Encounter: Payer: Self-pay | Admitting: Podiatry

## 2020-11-11 ENCOUNTER — Ambulatory Visit (INDEPENDENT_AMBULATORY_CARE_PROVIDER_SITE_OTHER): Payer: Medicare HMO

## 2020-11-11 ENCOUNTER — Other Ambulatory Visit: Payer: Self-pay

## 2020-11-11 ENCOUNTER — Ambulatory Visit: Payer: Medicare HMO | Admitting: Podiatry

## 2020-11-11 DIAGNOSIS — M2042 Other hammer toe(s) (acquired), left foot: Secondary | ICD-10-CM

## 2020-11-11 DIAGNOSIS — M2041 Other hammer toe(s) (acquired), right foot: Secondary | ICD-10-CM | POA: Diagnosis not present

## 2020-11-11 DIAGNOSIS — L84 Corns and callosities: Secondary | ICD-10-CM | POA: Diagnosis not present

## 2020-11-11 DIAGNOSIS — M21619 Bunion of unspecified foot: Secondary | ICD-10-CM | POA: Diagnosis not present

## 2020-11-11 DIAGNOSIS — M79671 Pain in right foot: Secondary | ICD-10-CM

## 2020-11-11 NOTE — Progress Notes (Signed)
Subjective:   Patient ID: Hannah [name], female   DOB: 75 y.o.   MRN: [id number]   HPI Patient states she is getting pain at times in both her feet and points to the second toe right second toe left and also lesions underneath the left foot.  Patient states that it is not severe but bothersome and she wanted to have it checked and she knows that she has bunion and hammertoe deformities   ROS      Objective:  Physical Exam  Neurovascular status intact with patient found to have structural deformities of the first metatarsal of both feet with deviation of the hallux against the second toe with pressure between them and is also noted to have lesions underneath the first and fifth metatarsal left that can become painful     Assessment:  Structural changes in both feet with HAV hammertoe deformity along with lesion formation     Plan:  H&P reviewed x-rays discussed condition and treatment options and I do not recommend treatment unless symptoms were to worsen.  Patient at this point had debridement done left I educated her on hammertoes bunions discussed the difficulty of correction and that we will get a try to hold off at the current time  X-rays indicate structural bunion deformity bilateral with digital deformities with pressure between the 2

## 2020-11-12 ENCOUNTER — Other Ambulatory Visit: Payer: Self-pay | Admitting: Podiatry

## 2020-11-12 DIAGNOSIS — M2041 Other hammer toe(s) (acquired), right foot: Secondary | ICD-10-CM

## 2020-11-12 DIAGNOSIS — M2042 Other hammer toe(s) (acquired), left foot: Secondary | ICD-10-CM

## 2020-11-15 ENCOUNTER — Other Ambulatory Visit: Payer: Self-pay | Admitting: Internal Medicine

## 2020-11-18 DIAGNOSIS — M1711 Unilateral primary osteoarthritis, right knee: Secondary | ICD-10-CM | POA: Diagnosis present

## 2020-11-20 DIAGNOSIS — M5137 Other intervertebral disc degeneration, lumbosacral region: Secondary | ICD-10-CM | POA: Diagnosis not present

## 2020-11-20 DIAGNOSIS — M5136 Other intervertebral disc degeneration, lumbar region: Secondary | ICD-10-CM | POA: Diagnosis not present

## 2020-11-20 DIAGNOSIS — M9902 Segmental and somatic dysfunction of thoracic region: Secondary | ICD-10-CM | POA: Diagnosis not present

## 2020-11-20 DIAGNOSIS — M9903 Segmental and somatic dysfunction of lumbar region: Secondary | ICD-10-CM | POA: Diagnosis not present

## 2020-11-20 DIAGNOSIS — M47814 Spondylosis without myelopathy or radiculopathy, thoracic region: Secondary | ICD-10-CM | POA: Diagnosis not present

## 2020-11-20 DIAGNOSIS — M9905 Segmental and somatic dysfunction of pelvic region: Secondary | ICD-10-CM | POA: Diagnosis not present

## 2020-11-21 NOTE — Patient Instructions (Addendum)
Goals Addressed            This Visit's Progress   . Track and Manage My Blood Pressure-Hypertension       Timeframe:  Long-Range Goal Priority:  High Start Date:                             Expected End Date:                       Follow Up Date 02/05/2021   - check blood pressure 3 times per week - check blood pressure weekly    Why is this important?    You won't feel high blood pressure, but it can still hurt your blood vessels.   High blood pressure can cause heart or kidney problems. It can also cause a stroke.   Making lifestyle changes like losing a little weight or eating less salt will help.   Checking your blood pressure at home and at different times of the day can help to control blood pressure.   If the doctor prescribes medicine remember to take it the way the doctor ordered.   Call the office if you cannot afford the medicine or if there are questions about it.            Patient Care Plan: Anemia    Problem Identified: Anemia Managed   Priority: Medium    Long-Range Goal: Anemia Managed   Start Date: 11/01/2020  Expected End Date: 05/01/2021  This Visit's Progress: On track  Priority: Medium  Note:   CARE PLAN ENTRY (see longtitudinal plan of care for additional care plan information)  Current Barriers:  [name] Knowledge Deficits related to disease process and Self Health management of Anemia . Chronic Disease Management support and education needs related to CKDIII, Hypertensive Nephropathy, DM  Nurse Case Manager Clinical Goal(s):  [name] Over the next 180 days, patient will work with the CCM team and healthcare provider to address needs related to disease education and support to improve Self Health management of Anemia CCM RN CM Interventions:  11/01/20 Successful Call completed with patient  . Evaluation of current treatment plan related to Anemia and patient's adherence to plan as established by provider . Determined patient continues to have this  condition managed by Dr. Irene Limbo with last OV completed on 06/27/20 with the following Assessment/Plan discussed;  o ASSESSMENT & PLAN:  o 75 year old of an American female with o  #1 IgG kappa monoclonal gammopathy of undetermined significance. Bone survey X-ray showed no lytic lesions. M spike is 0.6 g/dL in 10/016 and then 0.5g/dl in 10/2015. 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 o Bone marrow biopsy does show about 13% plasma cells however these appear to be polyclonal and likely related to a possible underlying inflammatory disorder. o Less likely a biclonal plasma cell dyscrasia  o #2 Normocytic normochromic anemia likely related to chronic inflammation from recent diagnosis of lupus/rheumatoid arthritis + CKD.  o Increased acanthocytes on peripheral blood smear -no overt evidence of liver disease. Could be a marker of some MDS o Bone marrow biopsy did not show overt plasma cell dyscrasia or myelodysplastic syndrome. 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 o Bone marrow biopsy showed decreased iron stores. o  Ferritin and iron have been trending down, she has not noticed any issues with bruising or bleeding recently. ?absorption issue. I have given her the option of IV iron which she has previously had without issue. She would like to have this.  o PLAN: o -Discussed pt labwork today, 06/27/20; no anemia, blood chemistries are steady, Sat Ratios are okay, Ferritin at goal - Goal Ferritin >100 o -No lab or clinical evidence of MGUS progression at this time.  o -Recommend pt continue 150 mg Iron Polysaccharide three days per week (M,W,F) o -Recommend pt f/u with Dr. Estanislado Pandy for management of her autoimmune condition. o -Recommend pt continue f/u with Dermatology for Vitiligo o -Will see back in 12 months with labs o FOLLOW  UP: o RTC with Dr Irene Limbo in 12 months with labs . Discussed plans with patient for ongoing care management follow up and provided patient with direct contact information for care management team Patient Self Care Activities:  Over the next 180 days, patient will:  . Continue to self administers medications as prescribed . Continue to attend all scheduled provider appointments . Continue to call pharmacy for medication refills . Perform ADL's independently . Perform IADL's independently . Call provider office for new concerns or questions Next Follow up date:  01/13/21    Patient Care Plan: CCM Pharmacy Care Plan    Problem Identified: HTN, HLD, DM, Rheumatoid Arthritis   Priority: High    Long-Range Goal: Disease Management   This Visit's Progress: On track  Priority: High  Note:    Current Barriers:  . Continue to manage glucose levels and take medication on a routine schedule.   Pharmacist Clinical Goal(s):  [name] Over the next 90 days, patient will achieve adherence to monitoring guidelines and medication adherence to achieve therapeutic efficacy through collaboration with PharmD and provider.   Interventions: . 1:1 collaboration with Glendale Chard, MD regarding development and update of comprehensive plan of care as evidenced by provider attestation and co-signature . Inter-disciplinary care team collaboration (see longitudinal plan of care) . Comprehensive medication review performed; medication list updated in electronic medical record  Hypertension (BP goal <130/80) -controlled -Current treatment: . Amlodipine 5 mg - taking a 1/2 tablet in the morning and a full tablet in the evening  . Irbesartan 300 mg take 1 tablet daily   -Current home readings: checking her BP at home- 133/74, pulse: 70  -Current dietary habits: mostly vegetables and protein -Current exercise habits: Monday, Tuesday and Friday exercise classes. On Friday she takes two classes.  -Denies  hypotensive/hypertensive symptoms -Educated on BP goals and benefits of medications for prevention of heart attack, stroke and kidney damage; Daily salt intake goal < 2300 mg; Proper BP monitoring technique; -Counseled to monitor BP at home at least twice per week, document, and provide log at future appointments -Recommended to continue current medication  Hyperlipidemia: (LDL goal < 70) -controlled -Current treatment: . Atorvastatin 80 mg - tablet daily . Aspirin 81 mg taking daily    -Current exercise habits: Monday, Tuesday and Friday exercise classes. On Friday she takes two classes.  -Educated on Importance of limiting foods high in cholesterol; -Recommended to continue current medication  Diabetes (A1c goal <7%) -controlled -Current medications: . None at this time  -Current home glucose readings - checking everyday  . fasting glucose: 77-83  -Denies hypoglycemic/hyperglycemic symptoms -Current meal patterns:  . breakfast: yogurt, or a bowl of grapefruit, winter cream of wheat with milk, wheat type of cereal   .  Dinner- last meal at 2 or 3: Eating more salmon, and asparagus - already prepared at [employer] , eating a lot of chicken and sometimes pork  o She tries to make sure that she digests her food because she goes to bed early  . drinks: water  -Current exercise:  Monday, Tuesday and Friday exercise classes. On Friday she takes two classes.  -Educated onA1c and blood sugar goals; Complications of diabetes including kidney damage, retinal damage, and cardiovascular disease; Exercise goal of 150 minutes per week; Prevention and management of hypoglycemic episodes; Benefits of routine self-monitoring of blood sugar; -Counseled to check feet daily and get yearly eye exams -Counseled on diet and exercise extensively  Rheumatoid Arthritis/ Lupus  (Goal: To reduce symptoms of Rheumatoid Arthritis and Lupus) -controlled -Current treatment  . Hydroxychloroquin 200 mg - take 1  tablet by mouth daily  . Allopurinol 100 mg tablet daily  -Patient seen by rheumatologist on regular basis -Recommended to continue current medication   Patient Goals/Self-Care Activities . Over the next 90 days, patient will:  - take medications as prescribed  Follow Up Plan: Telephone follow up appointment with care management team member scheduled for: The patient has been provided with contact information for the care management team and has been advised to call with any health related questions or concerns.     Visit Information  Goals Addressed            This Visit's Progress   . Track and Manage My Blood Pressure-Hypertension       Timeframe:  Long-Range Goal Priority:  High Start Date:                             Expected End Date:                       Follow Up Date 02/05/2021   - check blood pressure 3 times per week - check blood pressure weekly    Why is this important?    You won't feel high blood pressure, but it can still hurt your blood vessels.   High blood pressure can cause heart or kidney problems. It can also cause a stroke.   Making lifestyle changes like losing a little weight or eating less salt will help.   Checking your blood pressure at home and at different times of the day can help to control blood pressure.   If the doctor prescribes medicine remember to take it the way the doctor ordered.   Call the office if you cannot afford the medicine or if there are questions about it.           Patient Care Plan: Anemia    Problem Identified: Anemia Managed   Priority: Medium    Long-Range Goal: Anemia Managed   Start Date: 11/01/2020  Expected End Date: 05/01/2021  This Visit's Progress: On track  Priority: Medium  Note:   CARE PLAN ENTRY (see longtitudinal plan of care for additional care plan information)  Current Barriers:  [name] Knowledge Deficits related to disease process and Self Health management of Anemia . Chronic Disease  Management support and education needs related to CKDIII, Hypertensive Nephropathy, DM  Nurse Case Manager Clinical Goal(s):  [name] Over the next 180 days, patient will work with the CCM team and healthcare provider to address needs related to disease education and support to improve Self Health management of Anemia CCM RN  CM Interventions:  11/01/20 Successful Call completed with patient  . Evaluation of current treatment plan related to Anemia and patient's adherence to plan as established by provider . Determined patient continues to have this condition managed by Dr. Irene Limbo with last OV completed on 06/27/20 with the following Assessment/Plan discussed;  o ASSESSMENT & PLAN:  o 75 year old of an American female with o  #1 IgG kappa monoclonal gammopathy of undetermined significance. Bone survey X-ray showed no lytic lesions. M spike is 0.6 g/dL in 10/016 and then 0.5g/dl in 10/2015. 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 o Bone marrow biopsy does show about 13% plasma cells however these appear to be polyclonal and likely related to a possible underlying inflammatory disorder. o Less likely a biclonal plasma cell dyscrasia  o #2 Normocytic normochromic anemia likely related to chronic inflammation from recent diagnosis of lupus/rheumatoid arthritis + CKD.  o Increased acanthocytes on peripheral blood smear -no overt evidence of liver disease. Could be a marker of some MDS o Bone marrow biopsy did not show overt plasma cell dyscrasia or myelodysplastic syndrome. 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 o Bone marrow biopsy showed decreased iron stores. o Ferritin and iron have been trending down, she has not noticed any issues with bruising or bleeding recently. ?absorption issue. I have given her the option of IV iron which she has  previously had without issue. She would like to have this.  o PLAN: o -Discussed pt labwork today, 06/27/20; no anemia, blood chemistries are steady, Sat Ratios are okay, Ferritin at goal - Goal Ferritin >100 o -No lab or clinical evidence of MGUS progression at this time.  o -Recommend pt continue 150 mg Iron Polysaccharide three days per week (M,W,F) o -Recommend pt f/u with Dr. Estanislado Pandy for management of her autoimmune condition. o -Recommend pt continue f/u with Dermatology for Vitiligo o -Will see back in 12 months with labs o FOLLOW UP: o RTC with Dr Irene Limbo in 12 months with labs . Discussed plans with patient for ongoing care management follow up and provided patient with direct contact information for care management team Patient Self Care Activities:  Over the next 180 days, patient will:  . Continue to self administers medications as prescribed . Continue to attend all scheduled provider appointments . Continue to call pharmacy for medication refills . Perform ADL's independently . Perform IADL's independently . Call provider office for new concerns or questions Next Follow up date:  01/13/21    Patient Care Plan: CCM Pharmacy Care Plan    Problem Identified: HTN, HLD, DM, Rheumatoid Arthritis   Priority: High    Long-Range Goal: Disease Management   This Visit's Progress: On track  Priority: High  Note:    Current Barriers:  . Continue to manage glucose levels and take medication on a routine schedule.   Pharmacist Clinical Goal(s):  [name] Over the next 90 days, patient will achieve adherence to monitoring guidelines and medication adherence to achieve therapeutic efficacy through collaboration with PharmD and provider.   Interventions: . 1:1 collaboration with Glendale Chard, MD regarding development and update of comprehensive plan of care as evidenced by provider attestation and co-signature . Inter-disciplinary care team collaboration (see longitudinal plan of  care) . Comprehensive medication review performed; medication list updated in electronic medical record  Hypertension (  BP goal <130/80) -controlled -Current treatment: . Amlodipine 5 mg - taking a 1/2 tablet in the morning and a full tablet in the evening  . Irbesartan 300 mg take 1 tablet daily   -Current home readings: checking her BP at home- 133/74, pulse: 70  -Current dietary habits: mostly vegetables and protein -Current exercise habits: Monday, Tuesday and Friday exercise classes. On Friday she takes two classes.  -Denies hypotensive/hypertensive symptoms -Educated on BP goals and benefits of medications for prevention of heart attack, stroke and kidney damage; Daily salt intake goal < 2300 mg; Proper BP monitoring technique; -Counseled to monitor BP at home at least twice per week, document, and provide log at future appointments -Recommended to continue current medication  Hyperlipidemia: (LDL goal < 70) -controlled -Current treatment: . Atorvastatin 80 mg - tablet daily . Aspirin 81 mg taking daily    -Current exercise habits: Monday, Tuesday and Friday exercise classes. On Friday she takes two classes.  -Educated on Importance of limiting foods high in cholesterol; -Recommended to continue current medication  Diabetes (A1c goal <7%) -controlled -Current medications: . None at this time  -Current home glucose readings - checking everyday  . fasting glucose: 77-83  -Denies hypoglycemic/hyperglycemic symptoms -Current meal patterns:  . breakfast: yogurt, or a bowl of grapefruit, winter cream of wheat with milk, wheat type of cereal   . Dinner- last meal at 2 or 3: Eating more salmon, and asparagus - already prepared at [employer] , eating a lot of chicken and sometimes pork  o She tries to make sure that she digests her food because she goes to bed early  . drinks: water  -Current exercise:  Monday, Tuesday and Friday exercise classes. On Friday she takes two classes.   -Educated onA1c and blood sugar goals; Complications of diabetes including kidney damage, retinal damage, and cardiovascular disease; Exercise goal of 150 minutes per week; Prevention and management of hypoglycemic episodes; Benefits of routine self-monitoring of blood sugar; -Counseled to check feet daily and get yearly eye exams -Counseled on diet and exercise extensively  Rheumatoid Arthritis/ Lupus  (Goal: To reduce symptoms of Rheumatoid Arthritis and Lupus) -controlled -Current treatment  . Hydroxychloroquin 200 mg - take 1 tablet by mouth daily  . Allopurinol 100 mg tablet daily  -Patient seen by rheumatologist on regular basis -Recommended to continue current medication   Patient Goals/Self-Care Activities . Over the next 90 days, patient will:  - take medications as prescribed  Follow Up Plan: Telephone follow up appointment with care management team member scheduled for: The patient has been provided with contact information for the care management team and has been advised to call with any health related questions or concerns.       The patient verbalized understanding of instructions, educational materials, and care plan provided today and agreed to receive a mailed copy of patient instructions, educational materials, and care plan.  The pharmacy team will reach out to the patient again over the next 90 days.   Mayford Knife, [facility]

## 2020-11-22 DIAGNOSIS — M179 Osteoarthritis of knee, unspecified: Secondary | ICD-10-CM | POA: Diagnosis not present

## 2020-11-22 DIAGNOSIS — M170 Bilateral primary osteoarthritis of knee: Secondary | ICD-10-CM | POA: Diagnosis not present

## 2020-11-28 DIAGNOSIS — M179 Osteoarthritis of knee, unspecified: Secondary | ICD-10-CM | POA: Diagnosis not present

## 2020-11-28 DIAGNOSIS — M170 Bilateral primary osteoarthritis of knee: Secondary | ICD-10-CM | POA: Diagnosis not present

## 2020-11-29 ENCOUNTER — Telehealth: Payer: Self-pay

## 2020-11-29 DIAGNOSIS — Z79899 Other long term (current) drug therapy: Secondary | ICD-10-CM

## 2020-11-29 DIAGNOSIS — M810 Age-related osteoporosis without current pathological fracture: Secondary | ICD-10-CM

## 2020-11-29 DIAGNOSIS — Z8639 Personal history of other endocrine, nutritional and metabolic disease: Secondary | ICD-10-CM

## 2020-11-29 NOTE — Telephone Encounter (Signed)
Patient called requesting a return call to let her know when she is due for her next Prolia injection.

## 2020-11-29 NOTE — Telephone Encounter (Signed)
Patient advised she is due for her Prolia injection on 12/08/2020 or later. Patient advised she would need lab work on Monday 12/02/2020 or 12/03/2020. Lab Orders have been placed and we can send prescription once labs come back. Patient verbalized understanding .

## 2020-12-02 ENCOUNTER — Other Ambulatory Visit: Payer: Self-pay

## 2020-12-02 DIAGNOSIS — Z79899 Other long term (current) drug therapy: Secondary | ICD-10-CM

## 2020-12-02 DIAGNOSIS — Z8639 Personal history of other endocrine, nutritional and metabolic disease: Secondary | ICD-10-CM | POA: Diagnosis not present

## 2020-12-02 DIAGNOSIS — M810 Age-related osteoporosis without current pathological fracture: Secondary | ICD-10-CM

## 2020-12-03 ENCOUNTER — Other Ambulatory Visit: Payer: Self-pay

## 2020-12-03 ENCOUNTER — Other Ambulatory Visit: Payer: Self-pay | Admitting: Rheumatology

## 2020-12-03 DIAGNOSIS — M810 Age-related osteoporosis without current pathological fracture: Secondary | ICD-10-CM

## 2020-12-03 LAB — COMPLETE METABOLIC PANEL WITH GFR
AG Ratio: 1.6 (calc) (ref 1.0–2.5)
ALT: 13 U/L (ref 6–29)
AST: 18 U/L (ref 10–35)
Albumin: 3.9 g/dL (ref 3.6–5.1)
Alkaline phosphatase (APISO): 47 U/L (ref 37–153)
BUN/Creatinine Ratio: 18 (calc) (ref 6–22)
BUN: 20 mg/dL (ref 7–25)
CO2: 29 mmol/L (ref 20–32)
Calcium: 9.5 mg/dL (ref 8.6–10.4)
Chloride: 107 mmol/L (ref 98–110)
Creat: 1.12 mg/dL — ABNORMAL HIGH (ref 0.60–0.93)
GFR, Est African American: 56 mL/min/{1.73_m2} — ABNORMAL LOW (ref >=60)
GFR, Est Non African American: 48 mL/min/{1.73_m2} — ABNORMAL LOW (ref >=60)
Globulin: 2.4 g/dL (calc) (ref 1.9–3.7)
Glucose, Bld: 87 mg/dL (ref 65–99)
Potassium: 4.3 mmol/L (ref 3.5–5.3)
Sodium: 143 mmol/L (ref 135–146)
Total Bilirubin: 0.6 mg/dL (ref 0.2–1.2)
Total Protein: 6.3 g/dL (ref 6.1–8.1)

## 2020-12-03 LAB — CBC WITH DIFFERENTIAL/PLATELET
Absolute Monocytes: 451 cells/uL (ref 200–950)
Basophils Absolute: 48 cells/uL (ref 0–200)
Basophils Relative: 1 %
Eosinophils Absolute: 523 cells/uL — ABNORMAL HIGH (ref 15–500)
Eosinophils Relative: 10.9 %
HCT: 31.9 % — ABNORMAL LOW (ref 35.0–45.0)
Hemoglobin: 10.4 g/dL — ABNORMAL LOW (ref 11.7–15.5)
Lymphs Abs: 1094 cells/uL (ref 850–3900)
MCH: 31.2 pg (ref 27.0–33.0)
MCHC: 32.6 g/dL (ref 32.0–36.0)
MCV: 95.8 fL (ref 80.0–100.0)
MPV: 12.2 fL (ref 7.5–12.5)
Monocytes Relative: 9.4 %
Neutro Abs: 2683 cells/uL (ref 1500–7800)
Neutrophils Relative %: 55.9 %
Platelets: 162 10*3/uL (ref 140–400)
RBC: 3.33 10*6/uL — ABNORMAL LOW (ref 3.80–5.10)
RDW: 13.7 % (ref 11.0–15.0)
Total Lymphocyte: 22.8 %
WBC: 4.8 10*3/uL (ref 3.8–10.8)

## 2020-12-03 LAB — VITAMIN D 25 HYDROXY (VIT D DEFICIENCY, FRACTURES): Vit D, 25-Hydroxy: 50 ng/mL (ref 30–100)

## 2020-12-03 MED ORDER — DENOSUMAB 60 MG/ML ~~LOC~~ SOSY
60.0000 mg | PREFILLED_SYRINGE | SUBCUTANEOUS | 0 refills | Status: DC
Start: 2020-12-03 — End: 2020-12-03

## 2020-12-03 NOTE — Telephone Encounter (Signed)
Last Visit: 08/14/2020 Next Visit: 01/08/2021 Labs: 12/02/2020 Vitamin D is normal. Anemia persistent stable. GFR is low and stable.  Last prolia injection: 06/10/2020   Okay to refill prolia?

## 2020-12-03 NOTE — Progress Notes (Signed)
Vitamin D is normal.  Anemia persistent stable.  GFR is low and stable.

## 2020-12-04 ENCOUNTER — Telehealth: Payer: Self-pay

## 2020-12-04 NOTE — Chronic Care Management (AMB) (Signed)
Chronic Care Management Pharmacy Assistant   Name: Hannah [name]  MRN: [id number] DOB: November 04, 1945  Reason for Encounter: Diabetes Adherence Call    Recent office visits:  11/11/20-Regal, Tamala Fothergill, DPM (OV). 11/08/20-Lavoie, Truddie Hidden, MD.  Recent consult visits:  None.  Hospital visits:  None in previous 6 months  Medications: Outpatient Encounter Medications as of 12/04/2020  Medication Sig  . Accu-Chek Softclix Lancets lancets Use to check blood sugars daily E11.69  . albuterol (PROVENTIL HFA;VENTOLIN HFA) 108 (90 Base) MCG/ACT inhaler Inhale 2 puffs into the lungs every 6 (six) hours as needed for wheezing or shortness of breath.  . Alcohol Swabs (ALCOHOL PADS) 70 % PADS Use as directed to check blood sugars 1 time per day dx: e11.22  . allopurinol (ZYLOPRIM) 100 MG tablet TAKE 1 TABLET BY MOUTH EVERY DAY FOR GOUT  . amLODipine (NORVASC) 5 MG tablet TAKE 1/2 TABLET BY MOUTH EVERY MORNING AND 1 TABLET EVERY EVENING  . Ascorbic Acid (VITAMIN C) 1000 MG tablet Take 1,000 mg by mouth daily.  [name] aspirin EC 81 MG tablet Take 81 mg by mouth daily.  [name] atorvastatin (LIPITOR) 80 MG tablet TAKE 1 TABLET BY MOUTH EVERY DAY  . Blood Glucose Calibration (TRUE METRIX LEVEL 1) Low SOLN Use as directed to check blood sugars 1 time per day dx: e11.22  . Blood Glucose Monitoring Suppl (TRUE METRIX METER) w/Device KIT Use as directed to check blood sugars 1 time per day dx: e11.22  . budesonide-formoterol (SYMBICORT) 160-4.5 MCG/ACT inhaler INHALE 2 PUFFS BY MOUTH TWICE DAILY IN THE MORNING AND EVENING (Patient taking differently: INHALE 2 PUFFS BY MOUTH TWICE DAILY IN THE MORNING AND EVENING AS NEEDED)  . Calcium Carb-Cholecalciferol 600-800 MG-UNIT TABS Take 1 tablet by mouth daily.   . Cholecalciferol (VITAMIN D PO) Take 2,000 Units by mouth daily.   [name] denosumab (PROLIA) 60 MG/ML SOSY injection Inject 60 mg into the skin every 6 (six) months.  [name] glucose blood (ACCU-CHEK GUIDE) test strip  Use to check blood sugars daily E11.69  . glucose blood test strip 1 each by Other route as needed for other. Use as instructed  . hydroxychloroquine (PLAQUENIL) 200 MG tablet TAKE 1 TABLET BY MOUTH DAILY FOR RHEUMATOID ARTHRITIS  . Hypromellose (ARTIFICIAL TEARS OP) Apply to eye as needed.  . irbesartan (AVAPRO) 300 MG tablet TAKE 1 TABLET(300 MG) BY MOUTH DAILY  . iron polysaccharides (NIFEREX) 150 MG capsule Take 150 mg by mouth daily.  [name] SYNTHROID 88 MCG tablet TAKE 1 TABLET BY MOUTH EVERY DAY MONDAY TO SATURDAY AND OFF ON SUNDAYS  . tacrolimus (PROTOPIC) 0.1 % ointment Apply topically daily.  [name] METRIX BLOOD GLUCOSE TEST test strip Use as directed to check blood sugars 1 time per day dx: e11.22  . TRUEplus Lancets 33G MISC Use as directed to check blood sugars 1 time per day dx: e11.22   No facility-administered encounter medications on file as of 12/04/2020.   Recent Relevant Labs: Lab Results  Component Value Date/Time   HGBA1C 5.6 10/24/2020 11:05 AM   HGBA1C 5.5 06/19/2020 02:29 PM   MICROALBUR 80 10/31/2019 12:13 PM   MICROALBUR 80 06/15/2019 06:11 PM    Kidney Function Lab Results  Component Value Date/Time   CREATININE 1.12 (H) 12/02/2020 01:50 PM   CREATININE 1.00 (H) 08/14/2020 03:46 PM   CREATININE 1.4 (H) 08/12/2017 08:08 AM   CREATININE 1.2 (H) 02/16/2017 08:29 AM   GFRNONAA 48 (L) 12/02/2020 01:50 PM   GFRAA  56 (L) 12/02/2020 01:50 PM    . Current antihyperglycemic regimen:  o None at this time.  . What recent interventions/DTPs have been made to improve glycemic control:  o Patient reports she is checking her blood sugars daily.   . Have there been any recent hospitalizations or ED visits since last visit with CPP? No   . Patient denies hypoglycemic symptoms, including Pale, Sweaty, Shaky, Hungry, Nervous/irritable and Vision changes   . Patient denies hyperglycemic symptoms, including blurry vision, excessive thirst, fatigue, polyuria and weakness    . How often are you checking your blood sugar? once daily   . What are your blood sugars ranging?  o Fasting: 75 AM o Before meals: None o After meals: None o Bedtime: None  . During the week, how often does your blood glucose drop below 70? Never   . Are you checking your feet daily/regularly? Patient stated she is checking her feet daily and reports on her right foot her toe is larger next to her big toe. Patient voiced she discussed with Dr. Baird Cancer about a referral to a Foot Doctor.   Adherence Review: Is the patient currently on a STATIN medication? Yes Is the patient currently on ACE/ARB medication? Yes Does the patient have >5 day gap between last estimated fill dates? No  Patient reports her blood sugars are normally ranging in the mid to upper 80s.  Per patient; Dr. Baird Cancer advised her to hold off on antihyperglycemic regimen because her readings are good.  Patient verbalized after she checked her blood sugars this morning she ate an orange and sections of a grapefruit. Patient reports she had a slice of pizza for lunch. Patient also stated she tries to drink at least 64 ounces of water a day.   Patient voiced she has anemia, labs resulting at 10.4 on 12/02/20. Patient stated she continues to take her poly-iron 150 two times a day instead of once a day on Tuesday.  Patient voiced she is under the care of Dione Plover. Aluisio, MD at orthopedic center [facility]. She has had two series of gel shot injections in both knees in the last two weeks. Due for the next injections tomorrow. It will take up to a month to determine if the injections are helping. If not then a consult will be in placed to discuss possible knee replacement.   Star Rating Drugs: Irbesartan 300 MG: #90DS, last filled on 10/11/20 at [facility]. Atorvastatin 80 MG: #90 DS, last filled on 10/11/20 at [facility].  [name], CPP Notified.  Raynelle Highland, [place] Pharmacist  Assistant [phone] CCM Total Time: 38 minutes

## 2020-12-05 DIAGNOSIS — M179 Osteoarthritis of knee, unspecified: Secondary | ICD-10-CM | POA: Diagnosis not present

## 2020-12-05 DIAGNOSIS — M170 Bilateral primary osteoarthritis of knee: Secondary | ICD-10-CM | POA: Diagnosis not present

## 2020-12-05 MED FILL — PROLIA 60 MG/ML SOLN: 60 | 180 days supply | Qty: 1 | Fill #0

## 2020-12-06 ENCOUNTER — Telehealth: Payer: Self-pay

## 2020-12-06 NOTE — Telephone Encounter (Signed)
Patient called stating she needs to reschedule her appointment on 12/09/20 at 3:30 pm for her Prolia injection.  Patient requested a return call to let her know if she could reschedule to Tuesday, 12/10/20.  Please advise.

## 2020-12-09 ENCOUNTER — Other Ambulatory Visit: Payer: Self-pay

## 2020-12-09 ENCOUNTER — Ambulatory Visit (INDEPENDENT_AMBULATORY_CARE_PROVIDER_SITE_OTHER): Payer: Medicare HMO

## 2020-12-09 VITALS — BP 127/70 | HR 65

## 2020-12-09 DIAGNOSIS — Z79899 Other long term (current) drug therapy: Secondary | ICD-10-CM

## 2020-12-09 DIAGNOSIS — M810 Age-related osteoporosis without current pathological fracture: Secondary | ICD-10-CM | POA: Diagnosis not present

## 2020-12-09 MED ORDER — DENOSUMAB 60 MG/ML ~~LOC~~ SOSY
60.0000 mg | PREFILLED_SYRINGE | Freq: Once | SUBCUTANEOUS | Status: AC
  Administered 2020-12-09: 60 mg via SUBCUTANEOUS

## 2020-12-09 NOTE — Progress Notes (Signed)
Pharmacy Note  Subjective:   Patient presents to clinic today to receive bi-annual dose of Prolia.  Patient running a fever or have signs/symptoms of infection? No  Patient currently on antibiotics for the treatment of infection? No  Patient had fall in the last 6 months?  No  If yes, did it require medical attention? No   Patient taking calcium 1200 mg daily through diet or supplement and at least 800 units vitamin D? Yes  Objective: CMP     Component Value Date/Time   NA 143 12/02/2020 1350   NA 140 06/19/2020 1429   NA 142 08/12/2017 0808   K 4.3 12/02/2020 1350   K 3.7 08/12/2017 0808   CL 107 12/02/2020 1350   CO2 29 12/02/2020 1350   CO2 27 08/12/2017 0808   GLUCOSE 87 12/02/2020 1350   GLUCOSE 85 08/12/2017 0808   BUN 20 12/02/2020 1350   BUN 11 06/19/2020 1429   BUN 20.8 08/12/2017 0808   CREATININE 1.12 (H) 12/02/2020 1350   CREATININE 1.4 (H) 08/12/2017 0808   CALCIUM 9.5 12/02/2020 1350   CALCIUM 10.3 08/12/2017 0808   PROT 6.3 12/02/2020 1350   PROT 7.2 06/19/2020 1429   PROT 6.8 08/12/2017 0808   ALBUMIN 4.5 06/19/2020 1429   ALBUMIN 3.7 08/12/2017 0808   AST 18 12/02/2020 1350   AST 19 12/26/2019 1102   AST 31 08/12/2017 0808   ALT 13 12/02/2020 1350   ALT 13 12/26/2019 1102   ALT 38 08/12/2017 0808   ALKPHOS 53 06/19/2020 1429   ALKPHOS 51 08/12/2017 0808   BILITOT 0.6 12/02/2020 1350   BILITOT 0.7 06/19/2020 1429   BILITOT 0.7 12/26/2019 1102   BILITOT 0.45 08/12/2017 0808   GFRNONAA 48 (L) 12/02/2020 1350   GFRAA 56 (L) 12/02/2020 1350    CBC    Component Value Date/Time   WBC 4.8 12/02/2020 1350   RBC 3.33 (L) 12/02/2020 1350   HGB 10.4 (L) 12/02/2020 1350   HGB 11.1 06/19/2020 1429   HGB 9.9 (L) 08/12/2017 0808   HCT 31.9 (L) 12/02/2020 1350   HCT 33.7 (L) 06/19/2020 1429   HCT 30.7 (L) 08/12/2017 0808   PLT 162 12/02/2020 1350   PLT 200 06/19/2020 1429   MCV 95.8 12/02/2020 1350   MCV 95 06/19/2020 1429   MCV 97.5 08/12/2017  0808   MCH 31.2 12/02/2020 1350   MCHC 32.6 12/02/2020 1350   RDW 13.7 12/02/2020 1350   RDW 13.6 06/19/2020 1429   RDW 14.0 08/12/2017 0808   LYMPHSABS 1,094 12/02/2020 1350   LYMPHSABS 1.0 06/19/2020 1429   LYMPHSABS 0.9 08/12/2017 0808   MONOABS 0.4 12/26/2019 1102   MONOABS 0.3 08/12/2017 0808   EOSABS 523 (H) 12/02/2020 1350   EOSABS 0.4 06/19/2020 1429   BASOSABS 48 12/02/2020 1350   BASOSABS 0.1 06/19/2020 1429   BASOSABS 0.0 08/12/2017 0808    Lab Results  Component Value Date   VD25OH 50 12/02/2020    Assessment/Plan:  Administrations This Visit    denosumab (PROLIA) injection 60 mg    Admin Date 12/09/2020 Action Given Dose 60 mg Route Subcutaneous Administered By Earnestine Mealing, CMA         Patient tolerated injection well.   Patient is to return in 10-14 days for labs to monitor for hypocalcemia.  Future orders placed.   All questions encouraged and answered.  Instructed patient to call with any further questions or concerns.

## 2020-12-09 NOTE — Telephone Encounter (Signed)
Please advise 

## 2020-12-09 NOTE — Telephone Encounter (Signed)
Patient kept appointment for today.

## 2020-12-09 NOTE — Patient Instructions (Signed)
Standing Labs We placed an order today for your standing lab work.   Please have your standing labs drawn in 10 days.   If possible, please have your labs drawn 2 weeks prior to your appointment so that the provider can discuss your results at your appointment.  We have open lab daily Monday through Thursday from 1:30-4:30 PM and Friday from 1:30-4:00 PM at the office of Dr. Bo Merino, [facility].   Please be advised, all patients with office appointments requiring lab work will take precedents over walk-in lab work.  If possible, please come for your lab work on Monday and Friday afternoons, as you may experience shorter wait times. The office is located at [address] [place], [place], [place] [address] No appointment is necessary.   Labs are drawn by [facility]. Please bring your co-pay at the time of your lab draw.  You may receive a bill from [place] for your lab work.  If you wish to have your labs drawn at another location, please call the office 24 hours in advance to send orders.  If you have any questions regarding directions or hours of operation,  please call [phone].   As a reminder, please drink plenty of water prior to coming for your lab work. Thanks!

## 2020-12-19 ENCOUNTER — Other Ambulatory Visit: Payer: Self-pay

## 2020-12-19 DIAGNOSIS — Z79899 Other long term (current) drug therapy: Secondary | ICD-10-CM

## 2020-12-19 DIAGNOSIS — M9902 Segmental and somatic dysfunction of thoracic region: Secondary | ICD-10-CM | POA: Diagnosis not present

## 2020-12-19 DIAGNOSIS — M47814 Spondylosis without myelopathy or radiculopathy, thoracic region: Secondary | ICD-10-CM | POA: Diagnosis not present

## 2020-12-19 DIAGNOSIS — M5136 Other intervertebral disc degeneration, lumbar region: Secondary | ICD-10-CM | POA: Diagnosis not present

## 2020-12-19 DIAGNOSIS — M810 Age-related osteoporosis without current pathological fracture: Secondary | ICD-10-CM | POA: Diagnosis not present

## 2020-12-19 DIAGNOSIS — M9903 Segmental and somatic dysfunction of lumbar region: Secondary | ICD-10-CM | POA: Diagnosis not present

## 2020-12-19 DIAGNOSIS — M5137 Other intervertebral disc degeneration, lumbosacral region: Secondary | ICD-10-CM | POA: Diagnosis not present

## 2020-12-19 DIAGNOSIS — M9905 Segmental and somatic dysfunction of pelvic region: Secondary | ICD-10-CM | POA: Diagnosis not present

## 2020-12-20 ENCOUNTER — Telehealth: Payer: Self-pay

## 2020-12-20 LAB — CBC WITH DIFFERENTIAL/PLATELET
Absolute Monocytes: 484 cells/uL (ref 200–950)
Basophils Absolute: 62 cells/uL (ref 0–200)
Basophils Relative: 1.2 %
Eosinophils Absolute: 395 cells/uL (ref 15–500)
Eosinophils Relative: 7.6 %
HCT: 30.2 % — ABNORMAL LOW (ref 35.0–45.0)
Hemoglobin: 9.9 g/dL — ABNORMAL LOW (ref 11.7–15.5)
Lymphs Abs: 1087 cells/uL (ref 850–3900)
MCH: 30.9 pg (ref 27.0–33.0)
MCHC: 32.8 g/dL (ref 32.0–36.0)
MCV: 94.4 fL (ref 80.0–100.0)
MPV: 11.3 fL (ref 7.5–12.5)
Monocytes Relative: 9.3 %
Neutro Abs: 3172 cells/uL (ref 1500–7800)
Neutrophils Relative %: 61 %
Platelets: 184 10*3/uL (ref 140–400)
RBC: 3.2 10*6/uL — ABNORMAL LOW (ref 3.80–5.10)
RDW: 13.7 % (ref 11.0–15.0)
Total Lymphocyte: 20.9 %
WBC: 5.2 10*3/uL (ref 3.8–10.8)

## 2020-12-20 LAB — COMPLETE METABOLIC PANEL WITH GFR
AG Ratio: 1.7 (calc) (ref 1.0–2.5)
ALT: 11 U/L (ref 6–29)
AST: 18 U/L (ref 10–35)
Albumin: 3.9 g/dL (ref 3.6–5.1)
Alkaline phosphatase (APISO): 43 U/L (ref 37–153)
BUN/Creatinine Ratio: 13 (calc) (ref 6–22)
BUN: 18 mg/dL (ref 7–25)
CO2: 29 mmol/L (ref 20–32)
Calcium: 9.2 mg/dL (ref 8.6–10.4)
Chloride: 104 mmol/L (ref 98–110)
Creat: 1.37 mg/dL — ABNORMAL HIGH (ref 0.60–0.93)
GFR, Est African American: 44 mL/min/{1.73_m2} — ABNORMAL LOW (ref >=60)
GFR, Est Non African American: 38 mL/min/{1.73_m2} — ABNORMAL LOW (ref >=60)
Globulin: 2.3 g/dL (calc) (ref 1.9–3.7)
Glucose, Bld: 128 mg/dL — ABNORMAL HIGH (ref 65–99)
Potassium: 4.7 mmol/L (ref 3.5–5.3)
Sodium: 140 mmol/L (ref 135–146)
Total Bilirubin: 0.4 mg/dL (ref 0.2–1.2)
Total Protein: 6.2 g/dL (ref 6.1–8.1)

## 2020-12-20 NOTE — Telephone Encounter (Signed)
Returned call to pt. Pt had questions and concerns about some recent lab work done at Rheumatology office. Pt has contacted renal MD to review labs as well as Dr Irene Limbo. Informed pt Dr Irene Limbo will be notified about lab work as well.

## 2020-12-20 NOTE — Progress Notes (Signed)
Creatinine is elevated-1.37 and GFR is low and trending down-44.  Please clarify if she has been taking any NSAIDs.  Please also ask the patient if she has a nephrologist.    Calcium is WNL.    Anemia has worsened likely due to renal function.  Please forward lab work to PCP and her nephrologist if she has one.

## 2020-12-22 ENCOUNTER — Other Ambulatory Visit: Payer: Self-pay | Admitting: Rheumatology

## 2020-12-22 DIAGNOSIS — M0579 Rheumatoid arthritis with rheumatoid factor of multiple sites without organ or systems involvement: Secondary | ICD-10-CM

## 2020-12-22 NOTE — Telephone Encounter (Signed)
Last Visit: 08/14/2020 Next Visit: 01/08/2021 Labs: 12/19/2020, Creatinine is elevated-1.37 and GFR is low and trending down-44. Please clarify if she has been taking any NSAIDs. Please also ask the patient if she has a nephrologist.   Calcium is WNL.   Anemia has worsened likely due to renal function. Please forward lab work to PCP and her nephrologist if she has one. Eye exam: 09/12/2020   Current Dose per office note 08/14/2020,  Plaquenil 200 mg 1 tablet by mouth daily  DX: Rheumatoid arthritis involving multiple sites with positive rheumatoid factor   Last Fill: 09/26/2020  Okay to refill Plaquenil?

## 2020-12-25 NOTE — Progress Notes (Signed)
Office Visit Note  Patient: Hannah [name]             Date of Birth: 1946/05/09           MRN: [id number]             PCP: Glendale Chard, MD Referring: Glendale Chard, MD Visit Date: 01/08/2021 Occupation: @GUAROCC @  Subjective:  Pain in both knees   History of Present Illness: KEONDRIA SIEVER is a 75 y.o. female with history of seropositive rheumatoid arthritis, systemic lupus, gout, and osteoporosis.   She is taking plaquenil 200 mg 1 tablet by mouth daily.  She denies any signs or symptoms of a systemic lupus flare or rheumatoid arthritis flare.  She experiences intermittent stiffness in both hands but denies any joint swelling.  She continues to have chronic pain in both knee joints.  She has been following up closely with Dr. Wynelle Link and has had cortisone injections as well as Visco gel injections without any improvement in her symptoms.  She occasionally notices some warmth in both knees.  She has been wearing a compression sleeve on the left knee which provides some wrist support.  She uses a cane to assist with ambulation at night to try to prevent falls.  She has not had any recent falls or fractures.  Her most recent Prolia injection was administered on 12/09/2020.  She continues to take a calcium and vitamin D supplement on a daily basis.  She has been going to an exercise class twice a week as well as yoga twice a week.  She is also been seeing chiropractor once a month for lower back discomfort.  She is considering pursuing acupuncture.  Patient reports that she had her yearly physical 2 weeks ago with Dr. Baird Cancer.  She had an EKG at that time which was unremarkable.  She also had a recent follow-up at [facility] who recommended yearly office visits.  She has increased her dose of niferex to try to improve her anemia.  She will be seeing Dr. Irene Limbo in September 2022. She denies any recent rashes, hair loss, or photosensitivity.  She has not had any oral or nasal ulcerations.   She has chronic eye dryness and uses eyedrops on a daily basis.  She denies any pleuritic chest pain, shortness of breath, or palpitations recently.  She experiences intermittent symptoms of Raynaud's but continues to take a aspirin 81 mg 1 tablet by mouth daily and Norvasc 7.5 mg daily.   She denies any recent gout flares.  She continues to take allopurinol 100 mg by mouth daily.     Activities of Daily Living:  Patient reports morning stiffness for 30 minutes.   Patient Denies nocturnal pain.  Difficulty dressing/grooming: Denies Difficulty climbing stairs: Reports Difficulty getting out of chair: Reports Difficulty using hands for taps, buttons, cutlery, and/or writing: Reports  Review of Systems  Constitutional: Positive for fatigue.  HENT: Negative for mouth sores, mouth dryness and nose dryness.   Eyes: Positive for dryness. Negative for pain, itching and visual disturbance.  Respiratory: Negative for cough, hemoptysis, shortness of breath and difficulty breathing.   Cardiovascular: Negative for chest pain, palpitations, hypertension and swelling in legs/feet.  Gastrointestinal: Negative for blood in stool, constipation and diarrhea.  Endocrine: Negative for increased urination.  Genitourinary: Negative for difficulty urinating and painful urination.  Musculoskeletal: Positive for arthralgias, joint pain, joint swelling, myalgias, morning stiffness, muscle tenderness and myalgias. Negative for muscle weakness.  Skin: Negative for color change,  pallor, rash, hair loss, nodules/bumps, redness, skin tightness, ulcers and sensitivity to sunlight.  Allergic/Immunologic: Negative for susceptible to infections.  Neurological: Positive for numbness. Negative for dizziness, headaches, memory loss and weakness.  Hematological: Positive for anemia. Negative for swollen glands.  Psychiatric/Behavioral: Negative for depressed mood, confusion and sleep disturbance. The patient is not  nervous/anxious.     PMFS History:  Patient Active Problem List   Diagnosis Date Noted  . PVC (premature ventricular contraction) 05/06/2020  . Type 2 diabetes mellitus with stage 3 chronic kidney disease, without long-term current use of insulin ([place]) 03/20/2020  . Palpitations 03/20/2020  . 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/2020  . Primary hypothyroidism 03/20/2020  . Vitiligo 03/20/2020  . Iron deficiency anemia 08/12/2017  . Osteoporosis 10/02/2016  . Systemic lupus erythematosus ([place]) 10/02/2016  . Rheumatoid arthritis involving multiple sites with positive rheumatoid factor ([place]) 10/02/2016  . High risk medication use 10/02/2016  . History of chronic kidney disease 10/02/2016  . 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/2016  . Primary osteoarthritis of both knees 10/02/2016  . Vitamin D deficiency 10/02/2016  . History of diabetes mellitus 10/02/2016  . History of hypertension 10/02/2016  . History of asthma 10/02/2016  . Absolute anemia   . MGUS (monoclonal gammopathy of unknown significance)   . Normocytic anemia 03/21/2015  . Essential hypertension 03/20/2015  . Abnormal CT of the chest 07/30/2012  . Asthma 06/19/2012    Past Medical History:  Diagnosis Date  . Anemia   . Arthritis   . Asthma   . Diabetes mellitus without complication ([place])   . Gout 12/17/2014   patient reported  . Hyperlipidemia   . Hypertension   . Systemic lupus erythematosus (HCC)     Family History  Problem Relation Age of Onset  . Heart disease Father   . Diabetes Father   . Hypertension Mother   . Hypertension Sister   . Hypertension Sister   . Leukemia Sister    Past Surgical History:  Procedure Laterality Date  . CATARACT EXTRACTION Bilateral 2015  . DILATION AND CURETTAGE OF UTERUS    . DOPPLER ECHOCARDIOGRAPHY  05/2018   Internist to review with pt; potential heart murmur 06/20/18  . keratosis removal  2021  . SKIN SURGERY  11/30/2018   left side of  face  . TOOTH EXTRACTION     Social History   Social History Narrative  . Not on file   Immunization History  Administered Date(s) Administered  . 19-influenza Whole 07/15/2012  . DTaP 06/18/2019  . Fluad Quad(high Dose 65+) 06/19/2020  . Influenza Split 06/28/2014  . Influenza, High Dose Seasonal PF 05/18/2019  . Influenza,inj,Quad PF,6+ Mos 06/20/2018  . Moderna SARS-COV2 Booster Vaccination 01/01/2021  . Moderna Sars-Covid-2 Vaccination 11/20/2019, 12/19/2019, 08/06/2020  . Pneumococcal Conjugate-13 05/04/2018  . Pneumococcal Polysaccharide-23 05/18/2019  . Pneumococcal-Unspecified 06/28/2014  . Tdap 06/15/2019     Objective: Vital Signs: BP 135/73 (BP Location: Left Arm, Patient Position: Sitting, Cuff Size: Normal)   Pulse 68   Resp 15   Ht 4' 9"  (1.448 m)   Wt 159 lb (72.1 kg)   BMI 34.41 kg/m    Physical Exam Vitals and nursing note reviewed.  Constitutional:      Appearance: She is well-developed.  HENT:     Head: Normocephalic and atraumatic.  Eyes:     Conjunctiva/sclera: Conjunctivae normal.  Pulmonary:     Effort: Pulmonary effort is normal.  Abdominal:     Palpations: Abdomen is  soft.  Musculoskeletal:     Cervical back: Normal range of motion.  Skin:    General: Skin is warm and dry.     Capillary Refill: Capillary refill takes less than 2 seconds.  Neurological:     Mental Status: She is alert and oriented to person, place, and time.  Psychiatric:        Behavior: Behavior normal.      Musculoskeletal Exam: C-spine limited ROM.  Postural thoracic kyphosis.  Lumbar spine has good ROM.  Tenderness in the paraspinal muscles in the left lumber region.  Shoulder joints, elbow joints, wrist joints, MCPs, PIPs, and DIPs good ROM with no synovitis.  Synovial thickening of MCPs.  PIP and DIP thickening and prominence consistent with osteoarthritis of both hands.  Complete fist formation bilaterally.  Hip joints good ROM with no discomfort.  Slightly  limited extension of the left knee.  Right knee has good ROM with no warmth or effusion.  Ankle joints good ROM with no tenderness or swelling.   CDAI Exam: CDAI Score: 0.9  Patient Global: 5 mm; Provider Global: 4 mm Swollen: 0 ; Tender: 0  Joint Exam 01/08/2021   No joint exam has been documented for this visit   There is currently no information documented on the homunculus. Go to the Rheumatology activity and complete the homunculus joint exam.  Investigation: No additional findings.  Imaging: No results found.  Recent Labs: Lab Results  Component Value Date   WBC 5.2 12/19/2020   HGB 9.9 (L) 12/19/2020   PLT 184 12/19/2020   NA 140 12/19/2020   K 4.7 12/19/2020   CL 104 12/19/2020   CO2 29 12/19/2020   GLUCOSE 128 (H) 12/19/2020   BUN 18 12/19/2020   CREATININE 1.37 (H) 12/19/2020   BILITOT 0.4 12/19/2020   ALKPHOS 53 06/19/2020   AST 18 12/19/2020   ALT 11 12/19/2020   PROT 7.1 12/30/2020   ALBUMIN 4.5 06/19/2020   CALCIUM 9.7 12/30/2020   GFRAA 44 (L) 12/19/2020    Speciality Comments: PLQ eye exam: 09/12/2020 WNL @ [facility] Follow up in 1 year  Procedures:  No procedures performed Allergies: Shellfish allergy       Assessment / Plan:     Visit Diagnoses: Rheumatoid arthritis involving multiple sites with positive rheumatoid factor (HCC) - Positive RF, positive anti-CCP: She has no joint tenderness or synovitis on exam.  She has not had any recent rheumatoid arthritis flares.  She is clinically doing well taking Plaquenil 200 mg 1 tablet by mouth daily.  She continues to tolerate Plaquenil without any side effects.  She experiences intermittent stiffness in both hands but has no inflammation on exam.  She was able to make a complete fist bilaterally.  She has PIP and DIP thickening consistent with underlying osteoarthritis which could be contributing to her symptoms.  She continues to have chronic pain in both knee joints due to underlying  osteoarthritis.  Her rheumatoid arthritis is well controlled on the current treatment regimen.  She will remain on Plaquenil as prescribed.  She was advised to notify us if she develops increased joint pain or joint swelling.  She will follow-up in the office in 5 months.  Other systemic lupus erythematosus with other organ involvement (HCC) - Positive ANA, positive Smith, positive RNP: She has not had any signs or symptoms of a systemic lupus flare recently.  She is clinically doing well taking Plaquenil 200 mg 1 tablet by mouth daily.  She  has not had any recent rashes, photosensitivity, signs of alopecia, oral or nasal ulcerations, mouth dryness, pleuritic chest pain, shortness of breath, palpitations, or cervical lymphadenopathy.  She experiences eye dryness and uses eyedrops on a daily basis.  She has intermittent symptoms of Raynaud's but remains on aspirin 81 mg 1 tablet by mouth daily and Norvasc 7.5 mg daily.  No digital ulcerations or signs of gangrene were noted.  Lab work from 08/14/21 was reviewed today in the office: complements WNL,  dsDNA negative, and ESR WNL.  We will repeat the following lab work today.  She will remain on Plaquenil as prescribed.  She was advised to notify us if she develops signs or symptoms of a flare.    - Plan: Anti-DNA antibody, double-stranded, Sedimentation rate, C3 and C4  High risk medication use - Plaquenil 200 mg 1 tablet by mouth daily. PLQ eye exam: 09/12/2020.  CBC and CMP were updated on 12/19/2020 and results were reviewed with the patient today in the office.  She had a recent follow-up visit at [facility] to discuss the increase in creatinine and reduction in GFR.  She was advised to avoid all NSAIDs.  She also increased the dose of niferex to BID for chronic anemia. She will continue to have lab work on a regular basis.  She would like to return for lab work in 3 months to recheck CBC and CMP. Standing orders are in place.  She has not had any recent  infections. She has received 4 moderna covid-19 vaccine doses.  Idiopathic chronic gout of multiple sites without tophus -She has not had any signs or symptoms of a gout flare. She is taking allopurinol 100 mg by mouth daily for gout and is tolerating it without any side effects. Uric acid was within the desirable range: 3.9 on 08/14/21. We will recheck uric acid level today. She will continue on the current dose of allopurinol.   - Plan: Uric acid  Primary osteoarthritis of both knees: She has chronic pain in both knee joints. She has difficulty walking prolonged distances and climbing steps due to the severity of pain and stiffness.  She has slightly limited extension of the left knee on exam.  No warmth or effusion noted.  She has been wearing a left knee compression sleeve daily for support. She has undergone cortisone injections and visco gel injection series in the past with Dr. Wynelle Link.  She has an upcoming appointment to discuss further recommendations and treatment options.   Age-related osteoporosis without current pathological fracture - She is receiving prolia 60 mg sq injections every 6 months.  Her most recent prolia injection was on 12/09/20. She continues to take calcium and vitamin D as recommended.  She has not had any falls or fractures.  She uses a cane as needed to assist with ambulation, especially at night.  DEXA updated on 08/09/20: Total right hip BMD 0.660 with T score -2.4.  Statistically significant increase in BMD of lumbar spine and right femoral neck.  Left femoral neck stable. Due to repeat DEXA in November 2023.   MGUS (monoclonal gammopathy of unknown significance) - Followed by Dr. Irene Limbo on a yearly basis.  History of hypertension: BP was 135/73 today in the office. She continues to take norvasc as prescribed.   History of chronic kidney disease: She had a recent follow up visit at [facility].    History of diabetes mellitus: Discontinued metformin about 6 months  ago per recommendations of Dr. Baird Cancer per  patient.   History of anemia: She follows up with Dr. Irene Limbo on a yearly basis.  She recently increased the dose of Niferex to twice daily due to drop in hemoglobin.    History of asthma  History of vitamin D deficiency: Vitamin D was 50 on 12/02/2020.  She is taking a vitamin D supplement daily.   Orders: Orders Placed This Encounter  Procedures  . Anti-DNA antibody, double-stranded  . Sedimentation rate  . C3 and C4  . Uric acid   No orders of the defined types were placed in this encounter.     Follow-Up Instructions: Return in about 5 months (around 06/10/2021) for Rheumatoid arthritis, Systemic lupus erythematosus, Osteoporosis.   Ofilia Neas, PA-C  Note - This record has been created using Dragon software.  Chart creation errors have been sought, but may not always  have been located. Such creation errors do not reflect on  the standard of medical care.

## 2020-12-27 ENCOUNTER — Other Ambulatory Visit (HOSPITAL_COMMUNITY): Payer: Self-pay

## 2020-12-30 ENCOUNTER — Encounter: Payer: Self-pay | Admitting: Internal Medicine

## 2020-12-30 ENCOUNTER — Other Ambulatory Visit: Payer: Self-pay

## 2020-12-30 ENCOUNTER — Ambulatory Visit (INDEPENDENT_AMBULATORY_CARE_PROVIDER_SITE_OTHER): Payer: Medicare HMO | Admitting: Internal Medicine

## 2020-12-30 VITALS — BP 136/78 | HR 69 | Temp 98.3°F | Ht <= 58 in | Wt 154.4 lb

## 2020-12-30 DIAGNOSIS — E1122 Type 2 diabetes mellitus with diabetic chronic kidney disease: Secondary | ICD-10-CM

## 2020-12-30 DIAGNOSIS — E6609 Other obesity due to excess calories: Secondary | ICD-10-CM | POA: Diagnosis not present

## 2020-12-30 DIAGNOSIS — I129 Hypertensive chronic kidney disease with stage 1 through stage 4 chronic kidney disease, or unspecified chronic kidney disease: Secondary | ICD-10-CM | POA: Diagnosis not present

## 2020-12-30 DIAGNOSIS — M0579 Rheumatoid arthritis with rheumatoid factor of multiple sites without organ or systems involvement: Secondary | ICD-10-CM | POA: Diagnosis not present

## 2020-12-30 DIAGNOSIS — E039 Hypothyroidism, unspecified: Secondary | ICD-10-CM

## 2020-12-30 DIAGNOSIS — Z79899 Other long term (current) drug therapy: Secondary | ICD-10-CM

## 2020-12-30 DIAGNOSIS — Z6833 Body mass index (BMI) 33.0-33.9, adult: Secondary | ICD-10-CM

## 2020-12-30 DIAGNOSIS — Z0000 Encounter for general adult medical examination without abnormal findings: Secondary | ICD-10-CM | POA: Diagnosis not present

## 2020-12-30 DIAGNOSIS — N1831 Chronic kidney disease, stage 3a: Secondary | ICD-10-CM | POA: Diagnosis not present

## 2020-12-30 LAB — POCT URINALYSIS DIPSTICK
Bilirubin, UA: NEGATIVE
Blood, UA: NEGATIVE
Glucose, UA: NEGATIVE
Ketones, UA: NEGATIVE
Nitrite, UA: NEGATIVE
Protein, UA: POSITIVE — AB
Spec Grav, UA: >=1.03 — AB (ref 1.010–1.025)
Urobilinogen, UA: 0.2 E.U./dL
pH, UA: 5 (ref 5.0–8.0)

## 2020-12-30 LAB — POCT UA - MICROALBUMIN
Creatinine, POC: 300 mg/dL
Microalbumin Ur, POC: 150 mg/L

## 2020-12-30 NOTE — Patient Instructions (Signed)
Health Maintenance, Female Adopting a healthy lifestyle and getting preventive care are important in promoting health and wellness. Ask your health care provider about:  The right schedule for you to have regular tests and exams.  Things you can do on your own to prevent diseases and keep yourself healthy. What should I know about diet, weight, and exercise? Eat a healthy diet  Eat a diet that includes plenty of vegetables, fruits, low-fat dairy products, and lean protein.  Do not eat a lot of foods that are high in solid fats, added sugars, or sodium.   Maintain a healthy weight Body mass index (BMI) is used to identify weight problems. It estimates body fat based on height and weight. Your health care provider can help determine your BMI and help you achieve or maintain a healthy weight. Get regular exercise Get regular exercise. This is one of the most important things you can do for your health. Most adults should:  Exercise for at least 150 minutes each week. The exercise should increase your heart rate and make you sweat (moderate-intensity exercise).  Do strengthening exercises at least twice a week. This is in addition to the moderate-intensity exercise.  Spend less time sitting. Even light physical activity can be beneficial. Watch cholesterol and blood lipids Have your blood tested for lipids and cholesterol at 76 years of age, then have this test every 5 years. Have your cholesterol levels checked more often if:  Your lipid or cholesterol levels are high.  You are older than 76 years of age.  You are at high risk for heart disease. What should I know about cancer screening? Depending on your health history and family history, you may need to have cancer screening at various ages. This may include screening for:  Breast cancer.  Cervical cancer.  Colorectal cancer.  Skin cancer.  Lung cancer. What should I know about heart disease, diabetes, and high blood  pressure? Blood pressure and heart disease  High blood pressure causes heart disease and increases the risk of stroke. This is more likely to develop in people who have high blood pressure readings, are of African descent, or are overweight.  Have your blood pressure checked: ? Every 3-5 years if you are 18-39 years of age. ? Every year if you are 40 years old or older. Diabetes Have regular diabetes screenings. This checks your fasting blood sugar level. Have the screening done:  Once every three years after age 40 if you are at a normal weight and have a low risk for diabetes.  More often and at a younger age if you are overweight or have a high risk for diabetes. What should I know about preventing infection? Hepatitis B If you have a higher risk for hepatitis B, you should be screened for this virus. Talk with your health care provider to find out if you are at risk for hepatitis B infection. Hepatitis C Testing is recommended for:  Everyone born from 1945 through 1965.  Anyone with known risk factors for hepatitis C. Sexually transmitted infections (STIs)  Get screened for STIs, including gonorrhea and chlamydia, if: ? You are sexually active and are younger than 76 years of age. ? You are older than 76 years of age and your health care provider tells you that you are at risk for this type of infection. ? Your sexual activity has changed since you were last screened, and you are at increased risk for chlamydia or gonorrhea. Ask your health care provider   if you are at risk.  Ask your health care provider about whether you are at high risk for HIV. Your health care provider may recommend a prescription medicine to help prevent HIV infection. If you choose to take medicine to prevent HIV, you should first get tested for HIV. You should then be tested every 3 months for as long as you are taking the medicine. Pregnancy  If you are about to stop having your period (premenopausal) and  you may become pregnant, seek counseling before you get pregnant.  Take 400 to 800 micrograms (mcg) of folic acid every day if you become pregnant.  Ask for birth control (contraception) if you want to prevent pregnancy. Osteoporosis and menopause Osteoporosis is a disease in which the bones lose minerals and strength with aging. This can result in bone fractures. If you are 65 years old or older, or if you are at risk for osteoporosis and fractures, ask your health care provider if you should:  Be screened for bone loss.  Take a calcium or vitamin D supplement to lower your risk of fractures.  Be given hormone replacement therapy (HRT) to treat symptoms of menopause. Follow these instructions at home: Lifestyle  Do not use any products that contain nicotine or tobacco, such as cigarettes, e-cigarettes, and chewing tobacco. If you need help quitting, ask your health care provider.  Do not use street drugs.  Do not share needles.  Ask your health care provider for help if you need support or information about quitting drugs. Alcohol use  Do not drink alcohol if: ? Your health care provider tells you not to drink. ? You are pregnant, may be pregnant, or are planning to become pregnant.  If you drink alcohol: ? Limit how much you use to 0-1 drink a day. ? Limit intake if you are breastfeeding.  Be aware of how much alcohol is in your drink. In the [place] one drink equals one 12 oz bottle of beer (355 mL), one 5 oz glass of wine (148 mL), or one 1 oz glass of hard liquor (44 mL). General instructions  Schedule regular health, dental, and eye exams.  Stay current with your vaccines.  Tell your health care provider if: ? You often feel depressed. ? You have ever been abused or do not feel safe at home. Summary  Adopting a healthy lifestyle and getting preventive care are important in promoting health and wellness.  Follow your health care provider's instructions about healthy  diet, exercising, and getting tested or screened for diseases.  Follow your health care provider's instructions on monitoring your cholesterol and blood pressure. This information is not intended to replace advice given to you by your health care provider. Make sure you discuss any questions you have with your health care provider. Document Revised: 09/07/2018 Document Reviewed: 09/07/2018 Elsevier Patient Education  2021 [employer] Inc.  

## 2020-12-30 NOTE — Progress Notes (Signed)
I,Hannah [name],acting as a Education administrator for [name], MD.,have documented all relevant documentation on the behalf of [name], MD,as directed by  [name], MD while in the presence of [name], MD.  This visit occurred during the SARS-CoV-2 public health emergency.  Safety protocols were in place, including screening questions prior to the visit, additional usage of staff PPE, and extensive cleaning of exam room while observing appropriate contact time as indicated for disinfecting solutions.  Subjective:     Patient ID: Hannah [name] , female    DOB: [birth year] , 75 y.o.   MRN: [id number]   Chief Complaint  Patient presents with  . Annual Exam  . Diabetes  . Hypertension    HPI  She is here today for a full physical exam. She reports compliance with her medication regimen. She denies headaches, chest pain and palpitations.   Diabetes She presents for her follow-up diabetic visit. She has type 2 diabetes mellitus. There are no hypoglycemic associated symptoms. Pertinent negatives for hypoglycemia include no headaches. Pertinent negatives for diabetes include no blurred vision and no chest pain. There are no hypoglycemic complications. Diabetic complications include nephropathy. Risk factors for coronary artery disease include diabetes mellitus, dyslipidemia, hypertension, post-menopausal and sedentary lifestyle. She is following a diabetic diet. She participates in exercise three times a week. An ACE inhibitor/angiotensin II receptor blocker is being taken.  Hypertension This is a chronic problem. The current episode started more than 1 year ago. The problem has been gradually improving since onset. The problem is controlled. Pertinent negatives include no blurred vision, chest pain or headaches. Risk factors for coronary artery disease include diabetes mellitus, obesity and post-menopausal state. Past treatments include angiotensin blockers. The current  treatment provides moderate improvement. Hypertensive end-organ damage includes kidney disease.     Past Medical History:  Diagnosis Date  . Anemia   . Arthritis   . Asthma   . Diabetes mellitus without complication ([place])   . Gout 12/17/2014   patient reported  . Hyperlipidemia   . Hypertension   . Systemic lupus erythematosus (HCC)      Family History  Problem Relation Age of Onset  . Heart disease Father   . Diabetes Father   . Hypertension Mother   . Hypertension Sister   . Hypertension Sister   . Leukemia Sister      Current Outpatient Medications:  .  Accu-Chek Softclix Lancets lancets, Use to check blood sugars daily E11.69, Disp: 100 each, Rfl: 2 .  albuterol (PROVENTIL HFA;VENTOLIN HFA) 108 (90 Base) MCG/ACT inhaler, Inhale 2 puffs into the lungs every 6 (six) hours as needed for wheezing or shortness of breath., Disp: 1 Inhaler, Rfl: 2 .  Alcohol Swabs (ALCOHOL PADS) 70 % PADS, Use as directed to check blood sugars 1 time per day dx: e11.22, Disp: 150 each, Rfl: 2 .  allopurinol (ZYLOPRIM) 100 MG tablet, TAKE 1 TABLET BY MOUTH EVERY DAY FOR GOUT, Disp: 90 tablet, Rfl: 1 .  amLODipine (NORVASC) 5 MG tablet, TAKE 1/2 TABLET BY MOUTH EVERY MORNING AND 1 TABLET EVERY EVENING, Disp: 135 tablet, Rfl: 1 .  Ascorbic Acid (VITAMIN C) 1000 MG tablet, Take 1,000 mg by mouth daily., Disp: , Rfl:  .  aspirin EC 81 MG tablet, Take 81 mg by mouth daily., Disp: , Rfl:  .  atorvastatin (LIPITOR) 80 MG tablet, TAKE 1 TABLET BY MOUTH EVERY DAY, Disp: 90 tablet, Rfl: 1 .  Blood Glucose Calibration (TRUE METRIX  LEVEL 1) Low SOLN, Use as directed to check blood sugars 1 time per day dx: e11.22 (Patient not taking: Reported on 01/08/2021), Disp: 1 each, Rfl: 1 .  budesonide-formoterol (SYMBICORT) 160-4.5 MCG/ACT inhaler, INHALE 2 PUFFS BY MOUTH TWICE DAILY IN THE MORNING AND EVENING (Patient taking differently: INHALE 2 PUFFS BY MOUTH TWICE DAILY IN THE MORNING AND EVENING AS NEEDED), Disp:  10.2 g, Rfl: 2 .  Calcium Carb-Cholecalciferol 600-800 MG-UNIT TABS, Take 1 tablet by mouth daily. , Disp: , Rfl:  .  Cholecalciferol (VITAMIN D PO), Take 2,000 Units by mouth daily. , Disp: , Rfl:  .  denosumab (PROLIA) 60 MG/ML SOSY injection, INJECT 60 MG INTO THE SKIN EVERY 6 (SIX) MONTHS., Disp: 1 mL, Rfl: 0 .  glucose blood (ACCU-CHEK GUIDE) test strip, Use to check blood sugars daily E11.69, Disp: 100 each, Rfl: 2 .  hydroxychloroquine (PLAQUENIL) 200 MG tablet, TAKE 1 TABLET BY MOUTH DAILY FOR RHEUMATOID ARTHRITIS, Disp: 90 tablet, Rfl: 0 .  Hypromellose (ARTIFICIAL TEARS OP), Apply to eye as needed., Disp: , Rfl:  .  irbesartan (AVAPRO) 300 MG tablet, TAKE 1 TABLET(300 MG) BY MOUTH DAILY, Disp: 90 tablet, Rfl: 2 .  iron polysaccharides (NIFEREX) 150 MG capsule, Take 150 mg by mouth daily., Disp: , Rfl:  .  SYNTHROID 88 MCG tablet, TAKE 1 TABLET BY MOUTH EVERY DAY MONDAY TO SATURDAY AND OFF ON SUNDAYS, Disp: 90 tablet, Rfl: 0 .  tacrolimus (PROTOPIC) 0.1 % ointment, Apply topically daily., Disp: 30 g, Rfl: 6   Allergies  Allergen Reactions  . Shellfish Allergy Anaphylaxis and Hives     The patient states she uses post menopausal status for birth control. Last LMP was No LMP recorded. Patient is postmenopausal.. Negative for Dysmenorrhea  Negative for: breast discharge, breast lump(s), breast pain and breast self exam. Associated symptoms include abnormal vaginal bleeding. Pertinent negatives include abnormal bleeding (hematology), anxiety, decreased libido, depression, difficulty falling sleep, dyspareunia, history of infertility, nocturia, sexual dysfunction, sleep disturbances, urinary incontinence, urinary urgency, vaginal discharge and vaginal itching. Diet regular.The patient states her exercise level is  moderate.  . The patient's tobacco use is:  Social History   Tobacco Use  Smoking Status Never Smoker  Smokeless Tobacco Never Used  . She has been exposed to passive smoke. The  patient's alcohol use is:  Social History   Substance and Sexual Activity  Alcohol Use Not Currently   Comment: rarely    Review of Systems  Constitutional: Negative.   HENT: Negative.   Eyes: Negative.  Negative for blurred vision.  Respiratory: Negative.   Cardiovascular: Negative.  Negative for chest pain.  Endocrine: Negative.   Genitourinary: Negative.   Musculoskeletal: Negative.   Skin: Negative.   Allergic/Immunologic: Negative.   Neurological: Negative.  Negative for headaches.  Hematological: Negative.   Psychiatric/Behavioral: Negative.      Today's Vitals   12/30/20 1158  BP: 136/78  Pulse: 69  Temp: 98.3 F (36.8 C)  TempSrc: Oral  Weight: 154 lb 6.4 oz (70 kg)  Height: 4\' 9"  (1.448 m)  PainSc: 6   PainLoc: Knee   Body mass index is 33.41 kg/m.  Wt Readings from Last 3 Encounters:  01/08/21 159 lb (72.1 kg)  12/30/20 154 lb 6.4 oz (70 kg)  11/08/20 153 lb (69.4 kg)   Objective:  Physical Exam Vitals and nursing note reviewed.  Constitutional:      Appearance: Normal appearance.  HENT:     Head: Normocephalic and atraumatic.  Right Ear: Tympanic membrane, ear canal and external ear normal.     Left Ear: Tympanic membrane, ear canal and external ear normal.     Nose:     Comments: Masked     Mouth/Throat:     Comments: Masked  Eyes:     Extraocular Movements: Extraocular movements intact.     Conjunctiva/sclera: Conjunctivae normal.     Pupils: Pupils are equal, round, and reactive to light.  Cardiovascular:     Rate and Rhythm: Normal rate and regular rhythm.     Pulses: Normal pulses.          Dorsalis pedis pulses are 2+ on the right side and 2+ on the left side.     Heart sounds: Normal heart sounds.  Pulmonary:     Effort: Pulmonary effort is normal.     Breath sounds: Normal breath sounds.  Chest:  Breasts:     Tanner Score is 5.     Right: Normal.     Left: Normal.    Abdominal:     General: Bowel sounds are normal.      Palpations: Abdomen is soft.  Genitourinary:    Comments: deferred Musculoskeletal:        General: Normal range of motion.     Cervical back: Normal range of motion and neck supple.  Feet:     Right foot:     Protective Sensation: 5 sites tested. 5 sites sensed.     Skin integrity: Dry skin present.     Toenail Condition: Right toenails are normal.     Left foot:     Protective Sensation: 5 sites tested. 5 sites sensed.     Skin integrity: Dry skin present.     Toenail Condition: Left toenails are normal.  Skin:    General: Skin is warm and dry.     Comments: Scattered, hyperpigmented papular stuck on lesions on anterior chest  Neurological:     General: No focal deficit present.     Mental Status: She is alert and oriented to person, place, and time.  Psychiatric:        Mood and Affect: Mood normal.        Behavior: Behavior normal.         Assessment And Plan:     1. Routine general medical examination at health care facility Comments: A full exam was performed. Importance of monthly self breast exams was discussed with the patient. She is up to date with colon, breast CA screenings. PATIENT IS ADVISED TO GET 30-45 MINUTES REGULAR EXERCISE NO LESS THAN FOUR TO FIVE DAYS PER WEEK - BOTH WEIGHTBEARING EXERCISES AND AEROBIC ARE RECOMMENDED.  PATIENT IS ADVISED TO FOLLOW A HEALTHY DIET WITH AT LEAST SIX FRUITS/VEGGIES PER DAY, DECREASE INTAKE OF RED MEAT, AND TO INCREASE FISH INTAKE TO TWO DAYS PER WEEK.  MEATS/FISH SHOULD NOT BE FRIED, BAKED OR BROILED IS PREFERABLE.  IT IS ALSO IMPORTANT TO CUT BACK ON YOUR SUGAR INTAKE. PLEASE AVOID ANYTHING WITH ADDED SUGAR, CORN SYRUP OR OTHER SWEETENERS. IF YOU MUST USE A SWEETENER, YOU CAN TRY STEVIA. IT IS ALSO IMPORTANT TO AVOID ARTIFICIALLY SWEETENERS AND DIET BEVERAGES. LASTLY, I SUGGEST WEARING SPF 50 SUNSCREEN ON EXPOSED PARTS AND ESPECIALLY WHEN IN THE DIRECT SUNLIGHT FOR AN EXTENDED PERIOD OF TIME.  PLEASE AVOID FAST FOOD RESTAURANTS AND  INCREASE YOUR WATER INTAKE.   2. Type 2 diabetes mellitus with stage 3a chronic kidney disease, without long-term current use of insulin ([place])  Comments: Diabetic foot exam was performed. I DISCUSSED WITH THE PATIENT AT LENGTH REGARDING THE GOALS OF GLYCEMIC CONTROL AND POSSIBLE LONG-TERM COMPLICATIONS.  I  ALSO STRESSED THE IMPORTANCE OF COMPLIANCE WITH HOME GLUCOSE MONITORING, DIETARY RESTRICTIONS INCLUDING AVOIDANCE OF SUGARY DRINKS/PROCESSED FOODS,  ALONG WITH REGULAR EXERCISE.  I  ALSO STRESSED THE IMPORTANCE OF ANNUAL EYE EXAMS, SELF FOOT CARE AND COMPLIANCE WITH OFFICE VISITS.  - Hemoglobin A1c - Lipid panel - Vitamin B12 - TSH - T4, free - Protein electrophoresis, serum - Phosphorus - Parathyroid Hormone, Intact w/Ca  3. Parenchymal renal hypertension, stage 1 through stage 4 or unspecified chronic kidney disease Comments: Chronic, fair control. She is aware that goal BP is less than 130/80. Importance of sodium restriction was discussed with the patient. 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 She will f/u in 4-6 months for re-evaluation.  - POCT Urinalysis Dipstick (81002) - POCT UA - Microalbumin - EKG 12-Lead - Lipid panel  4. Primary hypothyroidism Comments: I will check thyroid panel and adjust meds as needed. - TSH - T4, free  5. Rheumatoid arthritis involving multiple sites with positive rheumatoid factor (HCC) Comments: Sx are stable. Encouraged to follow anti-inflammatory diet. She is also followed by Rheumatology.   6. Class 1 obesity due to excess calories with serious comorbidity and body mass index (BMI) of 33.0 to 33.9 in adult Comments: She is encouraged to strive for BMI less than 30 to decrease cardiac risk. Advised to aim for at least 150 minutes of exercise per week.  7. Drug therapy - Vitamin B12   Patient was given opportunity to ask questions. Patient verbalized understanding of the plan and was able to repeat key elements of the plan. All  questions were answered to their satisfaction.    I, [name], MD, have reviewed all documentation for this visit. The documentation on 01/19/21 for the exam, diagnosis, procedures, and orders are all accurate and complete.   IF YOU HAVE BEEN REFERRED TO A SPECIALIST, IT MAY TAKE 1-2 WEEKS TO SCHEDULE/PROCESS THE REFERRAL. IF YOU HAVE NOT HEARD FROM US/SPECIALIST IN TWO WEEKS, PLEASE GIVE [place] A CALL AT [phone] X [phone].   THE PATIENT IS ENCOURAGED TO PRACTICE SOCIAL DISTANCING DUE TO THE COVID-19 PANDEMIC.

## 2020-12-31 DIAGNOSIS — N183 Chronic kidney disease, stage 3 unspecified: Secondary | ICD-10-CM | POA: Diagnosis not present

## 2020-12-31 DIAGNOSIS — I129 Hypertensive chronic kidney disease with stage 1 through stage 4 chronic kidney disease, or unspecified chronic kidney disease: Secondary | ICD-10-CM | POA: Diagnosis not present

## 2020-12-31 DIAGNOSIS — M329 Systemic lupus erythematosus, unspecified: Secondary | ICD-10-CM | POA: Diagnosis not present

## 2020-12-31 DIAGNOSIS — E1122 Type 2 diabetes mellitus with diabetic chronic kidney disease: Secondary | ICD-10-CM | POA: Diagnosis not present

## 2020-12-31 DIAGNOSIS — M109 Gout, unspecified: Secondary | ICD-10-CM | POA: Diagnosis not present

## 2020-12-31 DIAGNOSIS — N2581 Secondary hyperparathyroidism of renal origin: Secondary | ICD-10-CM | POA: Diagnosis not present

## 2020-12-31 DIAGNOSIS — D631 Anemia in chronic kidney disease: Secondary | ICD-10-CM | POA: Diagnosis not present

## 2020-12-31 DIAGNOSIS — D472 Monoclonal gammopathy: Secondary | ICD-10-CM | POA: Diagnosis not present

## 2021-01-02 LAB — PHOSPHORUS: Phosphorus: 3.8 mg/dL (ref 3.0–4.3)

## 2021-01-02 LAB — T4, FREE: Free T4: 1.44 ng/dL (ref 0.82–1.77)

## 2021-01-02 LAB — PROTEIN ELECTROPHORESIS, SERUM
A/G Ratio: 1.3 (ref 0.7–1.7)
Albumin ELP: 4 g/dL (ref 2.9–4.4)
Alpha 1: 0.2 g/dL (ref 0.0–0.4)
Alpha 2: 0.9 g/dL (ref 0.4–1.0)
Beta: 0.9 g/dL (ref 0.7–1.3)
Gamma Globulin: 1 g/dL (ref 0.4–1.8)
Globulin, Total: 3.1 g/dL (ref 2.2–3.9)
Total Protein: 7.1 g/dL (ref 6.0–8.5)

## 2021-01-02 LAB — LIPID PANEL
Chol/HDL Ratio: 2 ratio (ref 0.0–4.4)
Cholesterol, Total: 153 mg/dL (ref 100–199)
HDL: 75 mg/dL (ref >39)
LDL Chol Calc (NIH): 67 mg/dL (ref 0–99)
Triglycerides: 50 mg/dL (ref 0–149)
VLDL Cholesterol Cal: 11 mg/dL (ref 5–40)

## 2021-01-02 LAB — PTH, INTACT AND CALCIUM
Calcium: 9.7 mg/dL (ref 8.7–10.3)
PTH: 64 pg/mL (ref 15–65)

## 2021-01-02 LAB — HEMOGLOBIN A1C
Est. average glucose Bld gHb Est-mCnc: 114 mg/dL
Hgb A1c MFr Bld: 5.6 % (ref 4.8–5.6)

## 2021-01-02 LAB — TSH: TSH: 0.666 u[IU]/mL (ref 0.450–4.500)

## 2021-01-02 LAB — VITAMIN B12: Vitamin B-12: 257 pg/mL (ref 232–1245)

## 2021-01-08 ENCOUNTER — Encounter: Payer: Self-pay | Admitting: Physician Assistant

## 2021-01-08 ENCOUNTER — Ambulatory Visit: Payer: Medicare HMO | Admitting: Physician Assistant

## 2021-01-08 ENCOUNTER — Other Ambulatory Visit: Payer: Self-pay

## 2021-01-08 VITALS — BP 135/73 | HR 68 | Resp 15 | Ht <= 58 in | Wt 159.0 lb

## 2021-01-08 DIAGNOSIS — Z79899 Other long term (current) drug therapy: Secondary | ICD-10-CM

## 2021-01-08 DIAGNOSIS — Z87448 Personal history of other diseases of urinary system: Secondary | ICD-10-CM | POA: Diagnosis not present

## 2021-01-08 DIAGNOSIS — Z8679 Personal history of other diseases of the circulatory system: Secondary | ICD-10-CM

## 2021-01-08 DIAGNOSIS — D472 Monoclonal gammopathy: Secondary | ICD-10-CM | POA: Diagnosis not present

## 2021-01-08 DIAGNOSIS — Z8639 Personal history of other endocrine, nutritional and metabolic disease: Secondary | ICD-10-CM

## 2021-01-08 DIAGNOSIS — Z8709 Personal history of other diseases of the respiratory system: Secondary | ICD-10-CM

## 2021-01-08 DIAGNOSIS — M1A09X0 Idiopathic chronic gout, multiple sites, without tophus (tophi): Secondary | ICD-10-CM | POA: Diagnosis not present

## 2021-01-08 DIAGNOSIS — M170 Bilateral primary osteoarthritis of knee: Secondary | ICD-10-CM | POA: Diagnosis not present

## 2021-01-08 DIAGNOSIS — M810 Age-related osteoporosis without current pathological fracture: Secondary | ICD-10-CM | POA: Diagnosis not present

## 2021-01-08 DIAGNOSIS — M0579 Rheumatoid arthritis with rheumatoid factor of multiple sites without organ or systems involvement: Secondary | ICD-10-CM

## 2021-01-08 DIAGNOSIS — Z862 Personal history of diseases of the blood and blood-forming organs and certain disorders involving the immune mechanism: Secondary | ICD-10-CM

## 2021-01-08 DIAGNOSIS — M3219 Other organ or system involvement in systemic lupus erythematosus: Secondary | ICD-10-CM | POA: Diagnosis not present

## 2021-01-09 DIAGNOSIS — M9905 Segmental and somatic dysfunction of pelvic region: Secondary | ICD-10-CM | POA: Diagnosis not present

## 2021-01-09 DIAGNOSIS — M5136 Other intervertebral disc degeneration, lumbar region: Secondary | ICD-10-CM | POA: Diagnosis not present

## 2021-01-09 DIAGNOSIS — M9902 Segmental and somatic dysfunction of thoracic region: Secondary | ICD-10-CM | POA: Diagnosis not present

## 2021-01-09 DIAGNOSIS — M47814 Spondylosis without myelopathy or radiculopathy, thoracic region: Secondary | ICD-10-CM | POA: Diagnosis not present

## 2021-01-09 DIAGNOSIS — M9903 Segmental and somatic dysfunction of lumbar region: Secondary | ICD-10-CM | POA: Diagnosis not present

## 2021-01-09 DIAGNOSIS — M5137 Other intervertebral disc degeneration, lumbosacral region: Secondary | ICD-10-CM | POA: Diagnosis not present

## 2021-01-10 LAB — C3 AND C4
C3 Complement: 113 mg/dL (ref 83–193)
C4 Complement: 38 mg/dL (ref 15–57)

## 2021-01-10 LAB — SEDIMENTATION RATE: Sed Rate: 2 mm/h (ref 0–30)

## 2021-01-10 LAB — ANTI-DNA ANTIBODY, DOUBLE-STRANDED: ds DNA Ab: 1 IU/mL

## 2021-01-10 LAB — URIC ACID: Uric Acid, Serum: 3.8 mg/dL (ref 2.5–7.0)

## 2021-01-13 ENCOUNTER — Telehealth: Payer: Medicare HMO

## 2021-01-13 ENCOUNTER — Ambulatory Visit (INDEPENDENT_AMBULATORY_CARE_PROVIDER_SITE_OTHER): Payer: Medicare HMO

## 2021-01-13 DIAGNOSIS — M0579 Rheumatoid arthritis with rheumatoid factor of multiple sites without organ or systems involvement: Secondary | ICD-10-CM

## 2021-01-13 DIAGNOSIS — I129 Hypertensive chronic kidney disease with stage 1 through stage 4 chronic kidney disease, or unspecified chronic kidney disease: Secondary | ICD-10-CM | POA: Diagnosis not present

## 2021-01-13 DIAGNOSIS — D508 Other iron deficiency anemias: Secondary | ICD-10-CM

## 2021-01-13 DIAGNOSIS — N183 Chronic kidney disease, stage 3 unspecified: Secondary | ICD-10-CM | POA: Diagnosis not present

## 2021-01-13 DIAGNOSIS — N1831 Chronic kidney disease, stage 3a: Secondary | ICD-10-CM

## 2021-01-13 DIAGNOSIS — E1122 Type 2 diabetes mellitus with diabetic chronic kidney disease: Secondary | ICD-10-CM

## 2021-01-13 NOTE — Progress Notes (Signed)
dsDNA is negative.  ESR and complements WNL.  Uric acid within the desirable range.   No change in therapy recommended at this time.

## 2021-01-15 NOTE — Patient Instructions (Signed)
Goals Addressed      Other   .  Anemia Managed   On track     Timeframe:  Long-Range Goal Priority:  Medium Start Date:  11/01/20                           Expected End Date:  05/01/21  Next Follow up date: 04/14/21  Over the next 180 days, patient will:  . Continue to self administers medications as prescribed . Continue to attend all scheduled provider appointments . Continue to call pharmacy for medication refills . Call provider office for new concerns or questions . Follow up with Dr. Eleonore Chiquito as directed                       .  Chronic Kidney Disease - disease progression prevented or minimized   On track     Timeframe:  Long-Range Goal Priority:  Medium Start Date: 01/13/21                            Expected End Date:  07/15/21  Next Scheduled Follow Up Date: 04/14/21  Self Care Activities:  . Increase water to 64 oz daily unless otherwise directed  . Continue to adhere to MD recommendations for CKD  . Continue to keep all scheduled follow up appointments . Take medications as directed  . Let your healthcare team know if you are unable to take your medications . Call your pharmacy for refills at least 7 days prior to running out of medication Patient Goals: - maintain or improve adequate kidney function                       .  Track and Manage My Blood Pressure-Hypertension   On track     Timeframe:  Long-Range Goal Priority:  Medium Start Date:  01/13/21                          Expected End Date: 07/15/21                     Follow Up Date: 04/14/21   Self-Care Activities: Self administers medications as prescribed Attends all scheduled provider appointments Calls provider office for new concerns, questions, or BP outside discussed parameters Checks BP and records as discussed Follows a low sodium diet/DASH diet Patient Goals: - check blood pressure 3 times per week - choose a place to take my blood pressure (home, clinic or office, retail store) - write blood  pressure results in a log or diary - learn about high blood pressure   Why is this important?    You won't feel high blood pressure, but it can still hurt your blood vessels.   High blood pressure can cause heart or kidney problems. It can also cause a stroke.   Making lifestyle changes like losing a [place]tem G[place]tas weight or eating less salt will help.   Checking your blood pressure at home and at different times of the day can help to control blood pressure.   If the doctor prescribes medicine remember to take it the way the doctor ordered.   Call the office if you cannot afford the medicine or if there are questions about it.

## 2021-01-15 NOTE — Progress Notes (Signed)
Chronic Care Management   CCM RN Visit Note  01/13/2021 Name: Hannah [name] MRN: [id number] DOB: 01-24-1946  Subjective: Hannah [name] is a 75 y.o. year old female who is a primary care patient of Glendale Chard, MD. The care management team was consulted for assistance with disease management and care coordination needs.    Engaged with patient by telephone for follow up visit in response to provider referral for case management and/or care coordination services.   Consent to Services:  The patient was given information about Chronic Care Management services, agreed to services, and gave verbal consent prior to initiation of services.  Please see initial visit note for detailed documentation.   Patient agreed to services and verbal consent obtained.   Assessment: Review of patient past medical history, allergies, medications, health status, including review of consultants reports, laboratory and other test data, was performed as part of comprehensive evaluation and provision of chronic care management services.   SDOH (Social Determinants of Health) assessments and interventions performed: Yes, no acute challenges identified    CCM Care Plan  Allergies  Allergen Reactions  . Shellfish Allergy Anaphylaxis and Hives    Outpatient Encounter Medications as of 01/13/2021  Medication Sig  . Accu-Chek Softclix Lancets lancets Use to check blood sugars daily E11.69  . albuterol (PROVENTIL HFA;VENTOLIN HFA) 108 (90 Base) MCG/ACT inhaler Inhale 2 puffs into the lungs every 6 (six) hours as needed for wheezing or shortness of breath.  . Alcohol Swabs (ALCOHOL PADS) 70 % PADS Use as directed to check blood sugars 1 time per day dx: e11.22  . allopurinol (ZYLOPRIM) 100 MG tablet TAKE 1 TABLET BY MOUTH EVERY DAY FOR GOUT  . amLODipine (NORVASC) 5 MG tablet TAKE 1/2 TABLET BY MOUTH EVERY MORNING AND 1 TABLET EVERY EVENING  . Ascorbic Acid (VITAMIN C) 1000 MG tablet Take 1,000 mg by  mouth daily.  [name] aspirin EC 81 MG tablet Take 81 mg by mouth daily.  [name] atorvastatin (LIPITOR) 80 MG tablet TAKE 1 TABLET BY MOUTH EVERY DAY  . Blood Glucose Calibration (TRUE METRIX LEVEL 1) Low SOLN Use as directed to check blood sugars 1 time per day dx: e11.22 (Patient not taking: Reported on 01/08/2021)  . budesonide-formoterol (SYMBICORT) 160-4.5 MCG/ACT inhaler INHALE 2 PUFFS BY MOUTH TWICE DAILY IN THE MORNING AND EVENING (Patient taking differently: INHALE 2 PUFFS BY MOUTH TWICE DAILY IN THE MORNING AND EVENING AS NEEDED)  . Calcium Carb-Cholecalciferol 600-800 MG-UNIT TABS Take 1 tablet by mouth daily.   . Cholecalciferol (VITAMIN D PO) Take 2,000 Units by mouth daily.   [name] denosumab (PROLIA) 60 MG/ML SOSY injection INJECT 60 MG INTO THE SKIN EVERY 6 (SIX) MONTHS.  [name] glucose blood (ACCU-CHEK GUIDE) test strip Use to check blood sugars daily E11.69  . hydroxychloroquine (PLAQUENIL) 200 MG tablet TAKE 1 TABLET BY MOUTH DAILY FOR RHEUMATOID ARTHRITIS  . Hypromellose (ARTIFICIAL TEARS OP) Apply to eye as needed.  . irbesartan (AVAPRO) 300 MG tablet TAKE 1 TABLET(300 MG) BY MOUTH DAILY  . iron polysaccharides (NIFEREX) 150 MG capsule Take 150 mg by mouth daily.  [name] SYNTHROID 88 MCG tablet TAKE 1 TABLET BY MOUTH EVERY DAY MONDAY TO SATURDAY AND OFF ON SUNDAYS  . tacrolimus (PROTOPIC) 0.1 % ointment Apply topically daily.   No facility-administered encounter medications on file as of 01/13/2021.    Patient Active Problem List   Diagnosis Date Noted  . PVC (premature ventricular contraction) 05/06/2020  . Type 2 diabetes mellitus with  stage 3 chronic kidney disease, without long-term current use of insulin ([place]) 03/20/2020  . Palpitations 03/20/2020  . 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/2020  . Primary hypothyroidism 03/20/2020  . Vitiligo 03/20/2020  . Iron deficiency anemia 08/12/2017  . Osteoporosis 10/02/2016  . Systemic lupus erythematosus ([place]) 10/02/2016  . Rheumatoid arthritis involving  multiple sites with positive rheumatoid factor ([place]) 10/02/2016  . High risk medication use 10/02/2016  . History of chronic kidney disease 10/02/2016  . 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/2016  . Primary osteoarthritis of both knees 10/02/2016  . Vitamin D deficiency 10/02/2016  . History of diabetes mellitus 10/02/2016  . History of hypertension 10/02/2016  . History of asthma 10/02/2016  . Absolute anemia   . MGUS (monoclonal gammopathy of unknown significance)   . Normocytic anemia 03/21/2015  . Essential hypertension 03/20/2015  . Abnormal CT of the chest 07/30/2012  . Asthma 06/19/2012    Conditions to be addressed/monitored:CKD III, hypertensive nephropathy, DM, RA, iron deficiency Anemia    Care Plan : Anemia  Updates made by Lynne Logan, RN since 01/15/2021 12:00 AM    Problem: Anemia Managed   Priority: Medium    Long-Range Goal: Anemia Managed   Start Date: 11/01/2020  Expected End Date: 05/01/2021  Recent Progress: On track  Priority: Medium  Note:   CARE PLAN ENTRY (see longtitudinal plan of care for additional care plan information)  Current Barriers:  [name] Knowledge Deficits related to disease process and Self Health management of Anemia . Chronic Disease Management support and education needs related to CKDIII, Hypertensive Nephropathy, DM  Nurse Case Manager Clinical Goal(s):  [name] Over the next 180 days, patient will work with the CCM team and healthcare provider to address needs related to disease education and support to improve Self Health management of Anemia CCM RN CM Interventions:  01/13/21 completed successful outbound call to patient  . Evaluation of current treatment plan related to Iron Deficiency Anemia and patient's adherence to plan as established by provider . Provided education to patient about basic disease process related to Anemia  . Review of patient status, including review of consultants reports, relevant  laboratory and other test results, and medications completed. . Reviewed medications with patient and discussed importance of medication adherence . Discussed and reviewed patient's next Hematology follow up is scheduled for November . Discussed patient plans to call Dr. Eleonore Chiquito to discuss her current iron dosage, she feels she needs to increase the dosage since her blood counts are lower . Discussed plans with patient for ongoing care management follow up and provided patient with direct contact information for care management team Patient Self Care Activities:  Over the next 180 days, patient will:  . Continue to self administers medications as prescribed . Continue to attend all scheduled provider appointments . Continue to call pharmacy for medication refills . Call provider office for new concerns or questions . Follow up with Dr. Eleonore Chiquito as directed  Next Follow up date:  04/14/21   Care Plan : Chronic Kidney Disease  Updates made by Lynne Logan, RN since 01/15/2021 12:00 AM    Problem: Chronic Kidney Disease   Priority: Medium    Long-Range Goal: Chronic Kidney Disease - disease progression prevented or minimized   Start Date: 01/13/2021  Expected End Date: 07/15/2021  This Visit's Progress: On track  Priority: Medium  Note:   Current Barriers:   Ineffective Self Health Maintenance  Clinical Goal(s):  [name] Collaboration with Glendale Chard, MD  regarding development and update of comprehensive plan of care as evidenced by provider attestation and co-signature . Inter-disciplinary care team collaboration (see longitudinal plan of care)  patient will work with care management team to address care coordination and chronic disease management needs related to Disease Management  Educational Needs  Care Coordination  Medication Management and Education  Psychosocial Support   Interventions:   Evaluation of current treatment plan related to CKD Stage III , self-management and  patient's adherence to plan as established by provider.  Collaboration with Glendale Chard, MD regarding development and update of comprehensive plan of care as evidenced by provider attestation       and co-signature  Inter-disciplinary care team collaboration (see longitudinal plan of care) . Provided education to patient about basic disease process related to Chronic Kidney Disease  . Review of patient status, including review of consultants reports, relevant laboratory and other test results, and medications completed. . Reviewed medications with patient and discussed importance of medication adherence . Educated patient on dietary and exercise recommendations, increase water to 64 oz daily unless otherwise directed   Discussed plans with patient for ongoing care management follow up and provided patient with direct contact information for care management team Self Care Activities:  . Increase water to 64 oz daily unless otherwise directed  . Continue to adhere to MD recommendations for CKD  . Continue to keep all scheduled follow up appointments . Take medications as directed  . Let your healthcare team know if you are unable to take your medications . Call your pharmacy for refills at least 7 days prior to running out of medication Patient Goals: - maintain or improve adequate kidney function  Follow Up Plan: Telephone follow up appointment with care management team member scheduled for: 04/14/21    Plan:Telephone follow up appointment with care management team member scheduled for:  04/14/21  Barb Merino, RN, BSN, CCM Care Management Coordinator [facility]  Direct Phone: [phone]

## 2021-01-16 DIAGNOSIS — M170 Bilateral primary osteoarthritis of knee: Secondary | ICD-10-CM | POA: Diagnosis not present

## 2021-01-20 ENCOUNTER — Telehealth: Payer: Self-pay | Admitting: Cardiovascular Disease

## 2021-01-20 DIAGNOSIS — R002 Palpitations: Secondary | ICD-10-CM

## 2021-01-20 NOTE — Telephone Encounter (Signed)
Orders placed for 30-day event monitor. Patient verbalized understanding.

## 2021-01-20 NOTE — Telephone Encounter (Signed)
Patient c/o Palpitations:  High priority if patient c/o lightheadedness, shortness of breath, or chest pain  1) How long have you had palpitations/irregular HR/ Afib? Are you having the symptoms now? A couple months, no  2) Are you currently experiencing lightheadedness, SOB or CP? no  3) Do you have a history of afib (atrial fibrillation) or irregular heart rhythm? no  4) Have you checked your BP or HR? (document readings if available):  Normally ranges 130/70's  5) Are you experiencing any other symptoms? no   Patient states she has been having palpitations and would like to know if she can wear a heart monitor. She states Dr. Acie Fredrickson had mentioned it in her last visit. She states her palpitations come and go. She states sometimes it will happen once or twice a week and then a week or 2 she'll have nothing. She states in April she did have an ekg at her PCP's office and it was normal. She states a nurse did tell her that if she was not having palpitations during the ekg it won't be detected.

## 2021-01-20 NOTE — Telephone Encounter (Signed)
Lets place 30 day event monitor  Thanks  Abbe Amsterdam

## 2021-01-21 ENCOUNTER — Ambulatory Visit (INDEPENDENT_AMBULATORY_CARE_PROVIDER_SITE_OTHER): Payer: Medicare HMO

## 2021-01-21 ENCOUNTER — Other Ambulatory Visit: Payer: Self-pay

## 2021-01-21 DIAGNOSIS — R002 Palpitations: Secondary | ICD-10-CM | POA: Diagnosis not present

## 2021-01-22 ENCOUNTER — Telehealth: Payer: Self-pay

## 2021-01-22 NOTE — Chronic Care Management (AMB) (Signed)
Chronic Care Management Pharmacy Assistant   Name: Hannah [name]  MRN: [id number] DOB: 1946-05-31   Reason for Encounter: Disease State/ Diabetes    Recent office visits:  12-30-2020 Glendale Chard, MD. CPE  01-13-2021  Little, Claudette Stapler, RN (CCM)  Recent consult visits:  12-05-2020 Jonnie Kind PA-C (Orthopedic surgery)  01-08-2021 Su Monks (Rheumatology)   Hospital visits:  None in previous 6 months  Medications: Outpatient Encounter Medications as of 01/22/2021  Medication Sig  . Accu-Chek Softclix Lancets lancets Use to check blood sugars daily E11.69  . albuterol (PROVENTIL HFA;VENTOLIN HFA) 108 (90 Base) MCG/ACT inhaler Inhale 2 puffs into the lungs every 6 (six) hours as needed for wheezing or shortness of breath.  . Alcohol Swabs (ALCOHOL PADS) 70 % PADS Use as directed to check blood sugars 1 time per day dx: e11.22  . allopurinol (ZYLOPRIM) 100 MG tablet TAKE 1 TABLET BY MOUTH EVERY DAY FOR GOUT  . amLODipine (NORVASC) 5 MG tablet TAKE 1/2 TABLET BY MOUTH EVERY MORNING AND 1 TABLET EVERY EVENING  . Ascorbic Acid (VITAMIN C) 1000 MG tablet Take 1,000 mg by mouth daily.  [name] aspirin EC 81 MG tablet Take 81 mg by mouth daily.  [name] atorvastatin (LIPITOR) 80 MG tablet TAKE 1 TABLET BY MOUTH EVERY DAY  . Blood Glucose Calibration (TRUE METRIX LEVEL 1) Low SOLN Use as directed to check blood sugars 1 time per day dx: e11.22 (Patient not taking: Reported on 01/08/2021)  . budesonide-formoterol (SYMBICORT) 160-4.5 MCG/ACT inhaler INHALE 2 PUFFS BY MOUTH TWICE DAILY IN THE MORNING AND EVENING (Patient taking differently: INHALE 2 PUFFS BY MOUTH TWICE DAILY IN THE MORNING AND EVENING AS NEEDED)  . Calcium Carb-Cholecalciferol 600-800 MG-UNIT TABS Take 1 tablet by mouth daily.   . Cholecalciferol (VITAMIN D PO) Take 2,000 Units by mouth daily.   [name] denosumab (PROLIA) 60 MG/ML SOSY injection INJECT 60 MG INTO THE SKIN EVERY 6 (SIX) MONTHS.  [name] glucose blood  (ACCU-CHEK GUIDE) test strip Use to check blood sugars daily E11.69  . hydroxychloroquine (PLAQUENIL) 200 MG tablet TAKE 1 TABLET BY MOUTH DAILY FOR RHEUMATOID ARTHRITIS  . Hypromellose (ARTIFICIAL TEARS OP) Apply to eye as needed.  . irbesartan (AVAPRO) 300 MG tablet TAKE 1 TABLET(300 MG) BY MOUTH DAILY  . iron polysaccharides (NIFEREX) 150 MG capsule Take 150 mg by mouth daily.  [name] SYNTHROID 88 MCG tablet TAKE 1 TABLET BY MOUTH EVERY DAY MONDAY TO SATURDAY AND OFF ON SUNDAYS  . tacrolimus (PROTOPIC) 0.1 % ointment Apply topically daily.   No facility-administered encounter medications on file as of 01/22/2021.   Recent Relevant Labs: Lab Results  Component Value Date/Time   HGBA1C 5.6 12/30/2020 12:54 PM   HGBA1C 5.6 10/24/2020 11:05 AM   MICROALBUR 150 12/30/2020 01:02 PM   MICROALBUR 80 10/31/2019 12:13 PM    Kidney Function Lab Results  Component Value Date/Time   CREATININE 1.37 (H) 12/19/2020 02:26 PM   CREATININE 1.12 (H) 12/02/2020 01:50 PM   CREATININE 1.4 (H) 08/12/2017 08:08 AM   CREATININE 1.2 (H) 02/16/2017 08:29 AM   GFRNONAA 38 (L) 12/19/2020 02:26 PM   GFRAA 44 (L) 12/19/2020 02:26 PM    . Current antihyperglycemic regimen:  o None at the moment  . What recent interventions/DTPs have been made to improve glycemic control:  o Patient has been adherent to monitoring low blood sugars and managing any hypoglycemic episodes. Patient states that she keeps a piece of candy if needed.  [name]  Have there been any recent hospitalizations or ED visits since last visit with CPP? No   . Patient denies hypoglycemic symptoms  . Patient denies hyperglycemic symptoms  . How often are you checking your blood sugar? Patient states she is checking blood sugars three times weekly  . What are your blood sugars ranging?  o Fasting: 90 o Before meals: none o After meals: none o Bedtime: none  . During the week, how often does your blood glucose drop below 70? Never   . Are you  checking your feet daily/regularly? Patient states she checks her feet daily after washing and makes sure toes are completely dried.  Adherence Review: Is the patient currently on a STATIN medication? Yes Is the patient currently on ACE/ARB medication? Yes Does the patient have >5 day gap between last estimated fill dates? Yes  NOTE: Patient states she's been having rapid heart beats and had a EKG completed. Patient is on a heart monitor for a month. Patient is concerned about her B12 being on the low side so she started a b12 supplement.  Star Rating Drugs: Irbesartan 300 MG: #90DS, last filled on 10/11/20 at [facility].  Atorvastatin 80 MG: #90 DS, last filled on 10/11/20 at [facility]  Metformin 500 MG: #90DS, last filled on 05/19/2020 at [facility]

## 2021-01-27 ENCOUNTER — Other Ambulatory Visit: Payer: Self-pay

## 2021-01-27 ENCOUNTER — Telehealth: Payer: Medicare HMO

## 2021-01-27 ENCOUNTER — Ambulatory Visit (INDEPENDENT_AMBULATORY_CARE_PROVIDER_SITE_OTHER): Payer: Medicare HMO

## 2021-01-27 ENCOUNTER — Ambulatory Visit (INDEPENDENT_AMBULATORY_CARE_PROVIDER_SITE_OTHER): Payer: Medicare HMO | Admitting: Nurse Practitioner

## 2021-01-27 ENCOUNTER — Encounter: Payer: Self-pay | Admitting: Nurse Practitioner

## 2021-01-27 VITALS — BP 118/72 | HR 72 | Temp 98.4°F | Ht <= 58 in | Wt 157.6 lb

## 2021-01-27 DIAGNOSIS — M5441 Lumbago with sciatica, right side: Secondary | ICD-10-CM

## 2021-01-27 DIAGNOSIS — I129 Hypertensive chronic kidney disease with stage 1 through stage 4 chronic kidney disease, or unspecified chronic kidney disease: Secondary | ICD-10-CM

## 2021-01-27 DIAGNOSIS — N1831 Chronic kidney disease, stage 3a: Secondary | ICD-10-CM

## 2021-01-27 DIAGNOSIS — E1122 Type 2 diabetes mellitus with diabetic chronic kidney disease: Secondary | ICD-10-CM

## 2021-01-27 DIAGNOSIS — N183 Chronic kidney disease, stage 3 unspecified: Secondary | ICD-10-CM

## 2021-01-27 DIAGNOSIS — D508 Other iron deficiency anemias: Secondary | ICD-10-CM

## 2021-01-27 MED ORDER — TRIAMCINOLONE ACETONIDE 40 MG/ML IJ SUSP
40.0000 mg | Freq: Once | INTRAMUSCULAR | Status: AC
  Administered 2021-01-27: 40 mg via INTRAMUSCULAR

## 2021-01-27 NOTE — Progress Notes (Signed)
I,Yamilka Roman [employer] as a Education administrator for [employer], FNP.,have documented all relevant documentation on the behalf of Minette Brine, FNP,as directed by  Minette Brine, FNP while in the presence of Minette Brine, [place].  This visit occurred during the SARS-CoV-2 public health emergency.  Safety protocols were in place, including screening questions prior to the visit, additional usage of staff PPE, and extensive cleaning of exam room while observing appropriate contact time as indicated for disinfecting solutions.  Subjective:     Patient ID: Hannah [name] , female    DOB: [birth year] , 75 y.o.   MRN: [id number]   Chief Complaint  Patient presents with  . Back Pain    Patient is having pain on her right leg that radiates down her leg. The pain is achy and throbbing it worsens with movement.  . Shoulder Pain    Patient stated her right shoulder started hurting yesterday. The pain in gone today.    HPI  Patient presents today for right lower back and right shoulder pain. She stated the pain in her back radiates down her leg. Pain started on Friday. She stayed in the bed all day, same on Saturday and a little better on Sunday. Painful when getting out of chairs. She is uncomfortable when seated. She took 2 tylenol - one on Friday and one on Saturday.  The first two days the tylenol has not helped. She does see a Restaurant manager, fast food.  She has used [employer] and heating pad.   She is wanting to have knee replacement to right knee in September and has a 30 day heart monitor. She was feeling like she was having palpitations.   Back Pain This is a new problem. The current episode started in the past 7 days. The problem occurs constantly. The problem has been gradually improving since onset. The pain is present in the lumbar spine (right hip to mid thigh, feels like a strain or pulling). The quality of the pain is described as aching. The pain is at a severity of 7/10 (Friday and Saturday - was 10/10).  The pain is moderate. The symptoms are aggravated by position and sitting. Pertinent negatives include no chest pain or headaches. Risk factors include obesity. She has tried heat and analgesics for the symptoms. The treatment provided mild relief.     Past Medical History:  Diagnosis Date  . Anemia   . Arthritis   . Asthma   . Diabetes mellitus without complication ([place])   . Gout 12/17/2014   patient reported  . Hyperlipidemia   . Hypertension   . Systemic lupus erythematosus ([facility])      Family History  Problem Relation Age of Onset  . Heart disease Father   . Diabetes Father   . Hypertension Mother   . Hypertension Sister   . Hypertension Sister   . Leukemia Sister      Current Outpatient Medications:  .  Accu-Chek Softclix Lancets lancets, Use to check blood sugars daily E11.69, Disp: 100 each, Rfl: 2 .  albuterol (PROVENTIL HFA;VENTOLIN HFA) 108 (90 Base) MCG/ACT inhaler, Inhale 2 puffs into the lungs every 6 (six) hours as needed for wheezing or shortness of breath., Disp: 1 Inhaler, Rfl: 2 .  Alcohol Swabs (ALCOHOL PADS) 70 % PADS, Use as directed to check blood sugars 1 time per day dx: e11.22, Disp: 150 each, Rfl: 2 .  allopurinol (ZYLOPRIM) 100 MG tablet, TAKE 1 TABLET BY MOUTH EVERY DAY FOR GOUT, Disp: 90 tablet, Rfl: 1 .  amLODipine (NORVASC) 5 MG tablet, TAKE 1/2 TABLET BY MOUTH EVERY MORNING AND 1 TABLET EVERY EVENING, Disp: 135 tablet, Rfl: 1 .  Ascorbic Acid (VITAMIN C) 1000 MG tablet, Take 1,000 mg by mouth daily., Disp: , Rfl:  .  aspirin EC 81 MG tablet, Take 81 mg by mouth daily., Disp: , Rfl:  .  atorvastatin (LIPITOR) 80 MG tablet, TAKE 1 TABLET BY MOUTH EVERY DAY, Disp: 90 tablet, Rfl: 1 .  budesonide-formoterol (SYMBICORT) 160-4.5 MCG/ACT inhaler, INHALE 2 PUFFS BY MOUTH TWICE DAILY IN THE MORNING AND EVENING (Patient taking differently: INHALE 2 PUFFS BY MOUTH TWICE DAILY IN THE MORNING AND EVENING AS NEEDED), Disp: 10.2 g, Rfl: 2 .  Calcium  Carb-Cholecalciferol 600-800 MG-UNIT TABS, Take 1 tablet by mouth daily. , Disp: , Rfl:  .  Cholecalciferol (VITAMIN D PO), Take 2,000 Units by mouth daily. , Disp: , Rfl:  .  denosumab (PROLIA) 60 MG/ML SOSY injection, INJECT 60 MG INTO THE SKIN EVERY 6 (SIX) MONTHS., Disp: 1 mL, Rfl: 0 .  glucose blood (ACCU-CHEK GUIDE) test strip, Use to check blood sugars daily E11.69, Disp: 100 each, Rfl: 2 .  hydroxychloroquine (PLAQUENIL) 200 MG tablet, TAKE 1 TABLET BY MOUTH DAILY FOR RHEUMATOID ARTHRITIS, Disp: 90 tablet, Rfl: 0 .  Hypromellose (ARTIFICIAL TEARS OP), Apply to eye as needed., Disp: , Rfl:  .  irbesartan (AVAPRO) 300 MG tablet, TAKE 1 TABLET(300 MG) BY MOUTH DAILY, Disp: 90 tablet, Rfl: 2 .  iron polysaccharides (NIFEREX) 150 MG capsule, Take 150 mg by mouth daily., Disp: , Rfl:  .  SYNTHROID 88 MCG tablet, TAKE 1 TABLET BY MOUTH EVERY DAY MONDAY TO SATURDAY AND OFF ON SUNDAYS, Disp: 90 tablet, Rfl: 0 .  tacrolimus (PROTOPIC) 0.1 % ointment, Apply topically daily., Disp: 30 g, Rfl: 6 .  Blood Glucose Calibration (TRUE METRIX LEVEL 1) Low SOLN, Use as directed to check blood sugars 1 time per day dx: e11.22 (Patient not taking: No sig reported), Disp: 1 each, Rfl: 1   Allergies  Allergen Reactions  . Shellfish Allergy Anaphylaxis and Hives     Review of Systems  Constitutional: Negative.   Respiratory: Negative.  Negative for cough.   Cardiovascular: Negative.  Negative for chest pain, palpitations and leg swelling.  Musculoskeletal: Positive for back pain.  Neurological: Negative for dizziness and headaches.  Psychiatric/Behavioral: Negative.      Today's Vitals   01/27/21 1511  BP: 118/72  Pulse: 72  Temp: 98.4 F (36.9 C)  TempSrc: Oral  Weight: 157 lb 9.6 oz (71.5 kg)  Height: 4\' 9"  (1.448 m)  PainSc: 7   PainLoc: Back   Body mass index is 34.1 kg/m.   Objective:  Physical Exam Vitals reviewed.  Constitutional:      General: She is not in acute distress.     Appearance: Normal appearance.  Cardiovascular:     Rate and Rhythm: Normal rate and regular rhythm.     Pulses: Normal pulses.     Heart sounds: Normal heart sounds. No murmur heard.   Pulmonary:     Effort: Pulmonary effort is normal. No respiratory distress.     Breath sounds: Normal breath sounds. No wheezing.  Musculoskeletal:        General: Tenderness (right low back pain, she also has pain with straight leg raise) present. No swelling.     Right lower leg: No edema.     Left lower leg: No edema.  Neurological:     General: No  focal deficit present.     Mental Status: She is alert and oriented to person, place, and time.     Cranial Nerves: No cranial nerve deficit.     Motor: No weakness.  Psychiatric:        Mood and Affect: Mood normal.        Behavior: Behavior normal.        Thought Content: Thought content normal.        Judgment: Judgment normal.         Assessment And Plan:     1. Acute bilateral low back pain with right-sided sciatica  I have given her kenalog 40 mg IM and will refer to PT for further evaluation  She is interested in dry needling for her chronic low back pain - triamcinolone acetonide (KENALOG-40) injection 40 mg - Ambulatory referral to Physical Therapy  2. Type 2 diabetes mellitus with stage 3a chronic kidney disease, without long-term current use of insulin (HCC)  Chronic, she is doing well and is not currently on any medications   She is to call with blood sugars if over 120.  She is to check for the next 5 days.    Patient was given opportunity to ask questions. Patient verbalized understanding of the plan and was able to repeat key elements of the plan. All questions were answered to their satisfaction.  Minette Brine, FNP   I, Minette Brine, FNP, have reviewed all documentation for this visit. The documentation on 01/27/21 for the exam, diagnosis, procedures, and orders are all accurate and complete.   IF YOU HAVE BEEN REFERRED  TO A SPECIALIST, IT MAY TAKE 1-2 WEEKS TO SCHEDULE/PROCESS THE REFERRAL. IF YOU HAVE NOT HEARD FROM US/SPECIALIST IN TWO WEEKS, PLEASE GIVE [place] A CALL AT [phone] X [phone].   THE PATIENT IS ENCOURAGED TO PRACTICE SOCIAL DISTANCING DUE TO THE COVID-19 PANDEMIC.

## 2021-01-29 ENCOUNTER — Telehealth: Payer: Self-pay | Admitting: Hematology

## 2021-01-29 NOTE — Telephone Encounter (Signed)
R/s appts per 5/3 sch msg. Pt requested labs to be done a few days prior to her OV. Pt is aware of appts.

## 2021-01-30 NOTE — Patient Instructions (Signed)
Goals Addressed    . Acute Low Back Pain Managed   On track    Timeframe:  Short-Term Goal Priority:  High Start Date: 01/27/21                            Expected End Date: 04/29/21  Next Scheduled Follow up date: 04/14/21       Patient Goals/Self Care Activities:  . Self-administers medications as prescribed . Attends all scheduled provider appointments . Calls pharmacy for medication refills . Calls provider office for new concerns or questions . Discussed PT referral with PCP during scheduled visit                     . Track and Manage Heart Rate and Rhythm-Atrial Fibrillation   On track    Timeframe:  Short-Term Goal Priority:  Medium Start Date:  01/27/21                           Expected End Date:  04/29/21                   Follow Up Date: 04/14/21   - begin a symptom diary - bring symptom diary to all appointments - check pulse (heart) rate once a day - make a plan to exercise regularly - make a plan to eat healthy - keep all lab appointments - take medicine as prescribed    Why is this important?    Atrial fibrillation may have no symptoms. Sometimes the symptoms get worse or happen more often.   It is important to keep track of what your symptoms are and when they happen.   A change in symptoms is important to discuss with your doctor or nurse.   Being active and healthy eating will also help you manage your heart condition.     Notes:

## 2021-01-30 NOTE — Chronic Care Management (AMB) (Signed)
Chronic Care Management   CCM RN Visit Note  01/27/2021 Name: Hannah [name] MRN: [id number] DOB: 11-Aug-1946  Subjective: Hannah [name] is a 75 y.o. year old female who is a primary care patient of Hannah Chard, MD. The care management team was consulted for assistance with disease management and care coordination needs.    Engaged with patient by telephone for follow up visit in response to provider referral for case management and/or care coordination services.   Consent to Services:  The patient was given information about Chronic Care Management services, agreed to services, and gave verbal consent prior to initiation of services.  Please see initial visit note for detailed documentation.   Patient agreed to services and verbal consent obtained.   Assessment: Review of patient past medical history, allergies, medications, health status, including review of consultants reports, laboratory and other test data, was performed as part of comprehensive evaluation and provision of chronic care management services.   SDOH (Social Determinants of Health) assessments and interventions performed:  yes, no acute needs   CCM Care Plan  Allergies  Allergen Reactions  . Shellfish Allergy Anaphylaxis and Hives    Outpatient Encounter Medications as of 01/27/2021  Medication Sig  . Accu-Chek Softclix Lancets lancets Use to check blood sugars daily E11.69  . albuterol (PROVENTIL HFA;VENTOLIN HFA) 108 (90 Base) MCG/ACT inhaler Inhale 2 puffs into the lungs every 6 (six) hours as needed for wheezing or shortness of breath.  . Alcohol Swabs (ALCOHOL PADS) 70 % PADS Use as directed to check blood sugars 1 time per day dx: e11.22  . allopurinol (ZYLOPRIM) 100 MG tablet TAKE 1 TABLET BY MOUTH EVERY DAY FOR GOUT  . amLODipine (NORVASC) 5 MG tablet TAKE 1/2 TABLET BY MOUTH EVERY MORNING AND 1 TABLET EVERY EVENING  . Ascorbic Acid (VITAMIN C) 1000 MG tablet Take 1,000 mg by mouth daily.  [name]  aspirin EC 81 MG tablet Take 81 mg by mouth daily.  [name] atorvastatin (LIPITOR) 80 MG tablet TAKE 1 TABLET BY MOUTH EVERY DAY  . Blood Glucose Calibration (TRUE METRIX LEVEL 1) Low SOLN Use as directed to check blood sugars 1 time per day dx: e11.22 (Patient not taking: No sig reported)  . budesonide-formoterol (SYMBICORT) 160-4.5 MCG/ACT inhaler INHALE 2 PUFFS BY MOUTH TWICE DAILY IN THE MORNING AND EVENING (Patient taking differently: INHALE 2 PUFFS BY MOUTH TWICE DAILY IN THE MORNING AND EVENING AS NEEDED)  . Calcium Carb-Cholecalciferol 600-800 MG-UNIT TABS Take 1 tablet by mouth daily.   . Cholecalciferol (VITAMIN D PO) Take 2,000 Units by mouth daily.   [name] denosumab (PROLIA) 60 MG/ML SOSY injection INJECT 60 MG INTO THE SKIN EVERY 6 (SIX) MONTHS.  [name] glucose blood (ACCU-CHEK GUIDE) test strip Use to check blood sugars daily E11.69  . hydroxychloroquine (PLAQUENIL) 200 MG tablet TAKE 1 TABLET BY MOUTH DAILY FOR RHEUMATOID ARTHRITIS  . Hypromellose (ARTIFICIAL TEARS OP) Apply to eye as needed.  . irbesartan (AVAPRO) 300 MG tablet TAKE 1 TABLET(300 MG) BY MOUTH DAILY  . iron polysaccharides (NIFEREX) 150 MG capsule Take 150 mg by mouth daily.  [name] SYNTHROID 88 MCG tablet TAKE 1 TABLET BY MOUTH EVERY DAY MONDAY TO SATURDAY AND OFF ON SUNDAYS  . tacrolimus (PROTOPIC) 0.1 % ointment Apply topically daily.  . [EXPIRED] triamcinolone acetonide (KENALOG-40) injection 40 mg    No facility-administered encounter medications on file as of 01/27/2021.    Patient Active Problem List   Diagnosis Date Noted  . PVC (premature ventricular  contraction) 05/06/2020  . Type 2 diabetes mellitus with stage 3 chronic kidney disease, without long-term current use of insulin ([place]) 03/20/2020  . Palpitations 03/20/2020  . 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/2020  . Primary hypothyroidism 03/20/2020  . Vitiligo 03/20/2020  . Iron deficiency anemia 08/12/2017  . Osteoporosis 10/02/2016  . Systemic lupus erythematosus ([place])  10/02/2016  . Rheumatoid arthritis involving multiple sites with positive rheumatoid factor ([place]) 10/02/2016  . High risk medication use 10/02/2016  . History of chronic kidney disease 10/02/2016  . 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/2016  . Primary osteoarthritis of both knees 10/02/2016  . Vitamin D deficiency 10/02/2016  . History of diabetes mellitus 10/02/2016  . History of hypertension 10/02/2016  . History of asthma 10/02/2016  . Absolute anemia   . MGUS (monoclonal gammopathy of unknown significance)   . Normocytic anemia 03/21/2015  . Essential hypertension 03/20/2015  . Abnormal CT of the chest 07/30/2012  . Asthma 06/19/2012    Conditions to be addressed/monitored:CKD III, hypertensive nephropathy, DM, RA, iron deficiency Anemia    Care Plan : Nonspecific Low Back Pain (Adult)  Updates made by Lynne Logan, RN since 01/30/2021 12:00 AM    Problem: Acute Low Back Pain   Priority: High    Goal: Acute Low Back Pain Managed   Start Date: 01/27/2021  Expected End Date: 04/29/2021  This Visit's Progress: On track  Priority: High  Note:   Current Barriers:  [name] Knowledge Deficits related to managing acute/chronic pain . Chronic Disease Management support and education needs related to acute/chronic pain Nurse Case Manager Clinical Goal(s):  . patient will verbalize understanding of plan for managing pain and following up with outpatient PT . patient will verbalize acceptable level of pain relief and ability to engage in desired activities Interventions:  . Collaboration with Hannah Chard, MD regarding development and update of comprehensive plan of care as evidenced by provider attestation and co-signature . Inter-disciplinary care team collaboration (see longitudinal plan of care) . 01/27/21 completed successful inbound call with patient  . Evaluation of current treatment plan related to acute right low back pain and patient's adherence to plan as  established by provider. . - complementary therapy use encouraged . - effectiveness of pharmacologic therapy monitored . - pain assessed . - participation in physical therapy encouraged . Provided education to patient re: alternative pain methods including Dry Needling and how to be evaluated  . Reviewed medications with patient and discussed medications used for pain control for this condition and effectiveness or lack thereof  . Added patient to PCP schedule for evaluation of acute pain per patient request with Minette Brine, FNP scheduled for 01/27/21 @4 :15 PM . Printed patient educational material related to Dry Needling for patient to review (to be provided by office staff during today's visit) . Discussed plans with patient for ongoing care management follow up and provided patient with direct contact information for care management team Patient Goals/Self Care Activities:  . Self-administers medications as prescribed . Attends all scheduled provider appointments . Calls pharmacy for medication refills . Calls provider office for new concerns or questions . Discussed PT referral with PCP during scheduled visit   Follow Up Plan: Telephone follow up appointment with care management team member scheduled for: 04/14/21     Care Plan : Atrial Fibrillation (Adult)  Updates made by Lynne Logan, RN since 01/30/2021 12:00 AM    Problem: Dysrhythmia (Atrial Fibrillation)   Priority: Medium    Long-Range Goal:  Heart Rate and Rhythm Monitored and Managed   Start Date: 01/27/2021  Expected End Date: 04/29/2021  This Visit's Progress: On track  Priority: Medium  Note:   Current Barriers:  [name] Knowledge deficits related to self health management of palpitations  . Chronic Disease Management support and education needs related to CKD III, hypertensive nephropathy, DM, RA, iron deficiency Anemia   Nurse Case Manager Clinical Goal(s):   Patient will verbalize understanding of Action Plan for  management of dysrhythmia and when to call doctor Interventions:  . Collaboration with Hannah Chard, MD regarding development and update of comprehensive plan of care as evidenced by provider attestation and co-signature . Inter-disciplinary care team collaboration (see longitudinal plan of care) . 01/27/21 successful outbound call completed with patient  . Evaluation of current treatment plan related to evaluation and treatment of cardiac dysrhythmia and patient's adherence to plan as established by provider. . Basic overview and discussion of cardiac dysrhythmia disease state including types, potential cause and standard treatment  . Medications reviewed . Determined patient reported ongoing palpitations to her Cardiology and was prescribed to wear a 30 day Holter monitor for evaluation  . Determined patient is able to verbalize having a good understanding of how to use and wear the monitor  . Discussed next Cardiology follow up is scheduled for Dr. Acie Fredrickson set for 04/09/21 @8 :00 AM  . Discussed plans with patient for ongoing care management follow up and provided patient with direct contact information for care management team Self-Care Activities: Patient verbalizes understanding of plan to contact her Cardiologist promptly for new or worsening symptoms and or call 911 for medical emergency Self administers medications as prescribed Attends all scheduled provider appointments Calls pharmacy for medication refills Calls provider office for new concerns or questions Patient Goals: - begin a symptom diary - bring symptom diary to all appointments - check pulse (heart) rate once a day - make a plan to exercise regularly - make a plan to eat healthy - keep all lab appointments - take medicine as prescribed  Follow Up Plan: Telephone follow up appointment with care management team member scheduled for: 04/14/21    Plan:Telephone follow up appointment with care management team member scheduled  for:  04/14/21  Barb Merino, RN, BSN, CCM Care Management Coordinator [place] [facility]  Direct Phone: [phone]

## 2021-02-02 IMAGING — CR DG LUMBAR SPINE COMPLETE 4+V
5 series · 5 of 5 positions shown · non-contrast
Comparison: 09/26/2012

CLINICAL DATA: 72-year-old female with a history of worsening back
pain

EXAM:
LUMBAR SPINE - COMPLETE 4+ VIEW

[t l-spine a.p.]
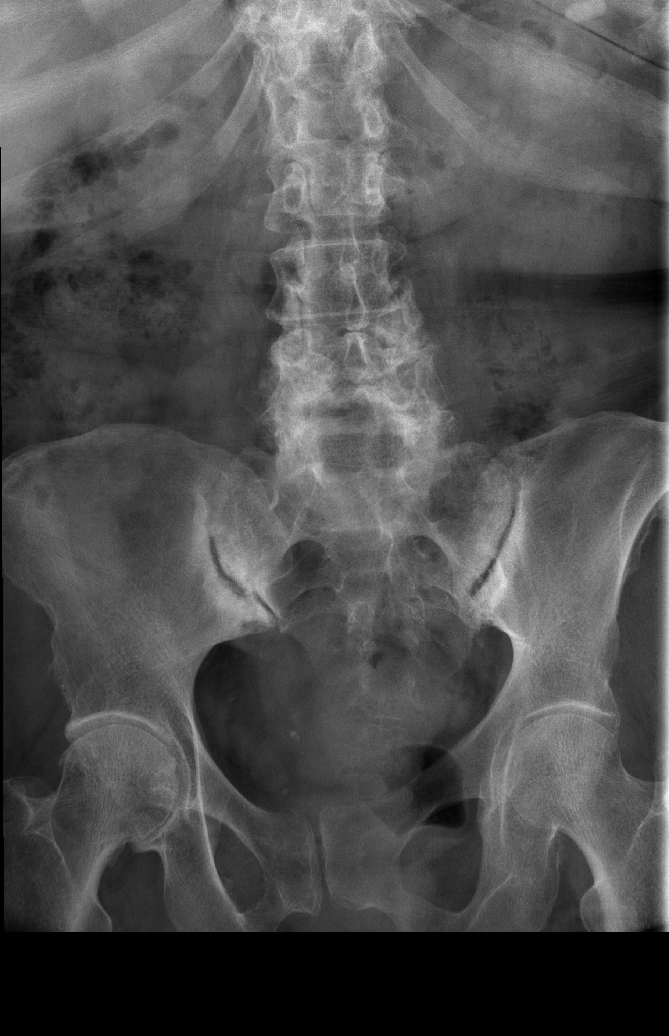

[t l-spine oblique exposure (1 of 2)]
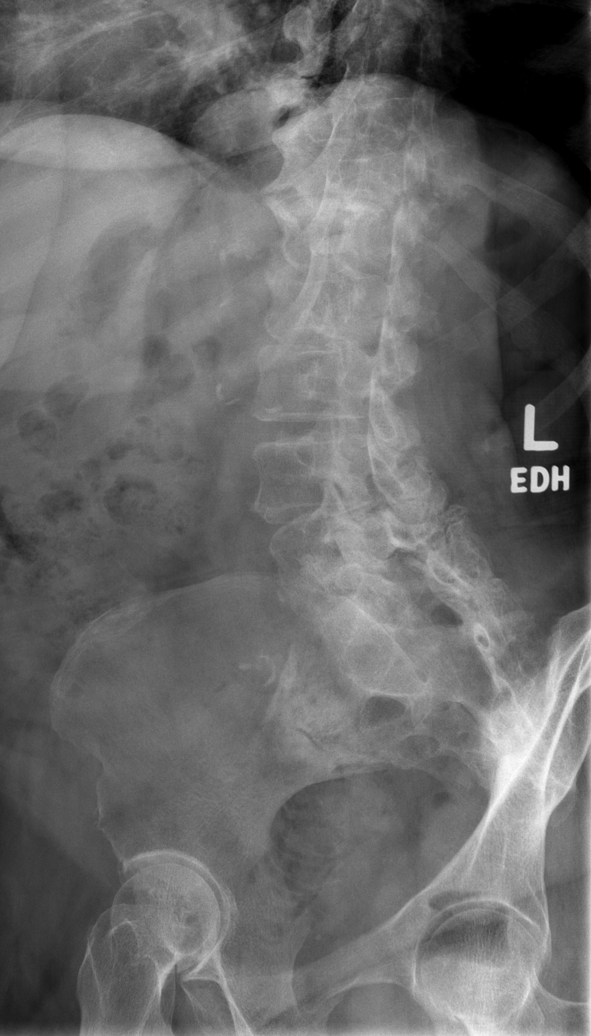

[t l-spine oblique exposure (2 of 2)]
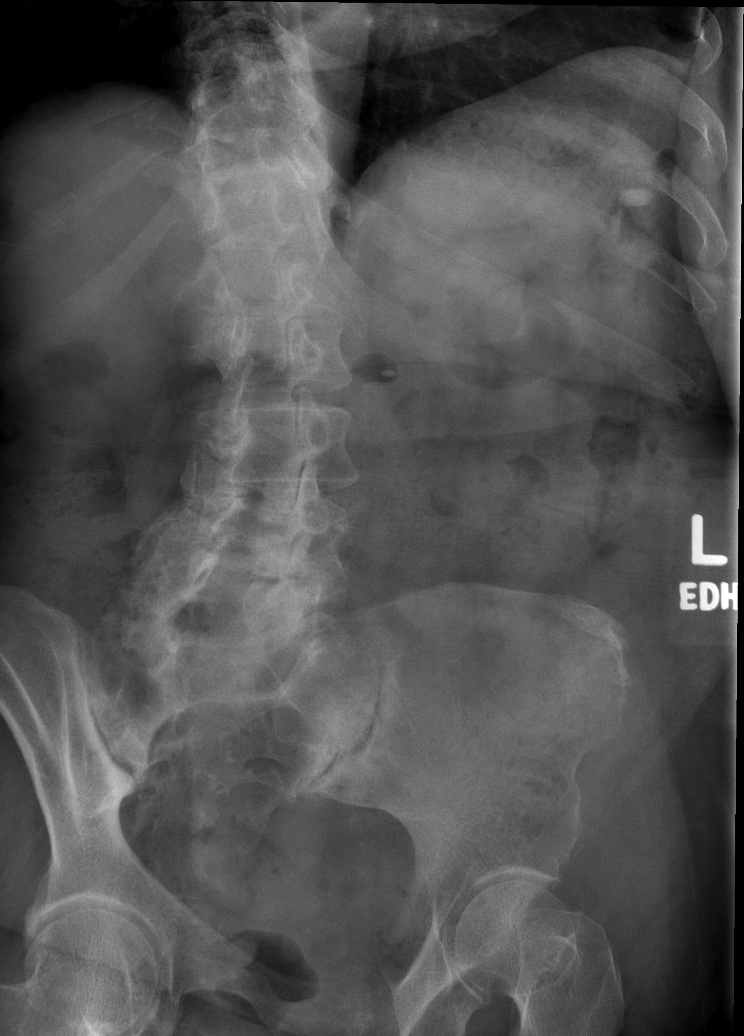

[t l-spine lat]
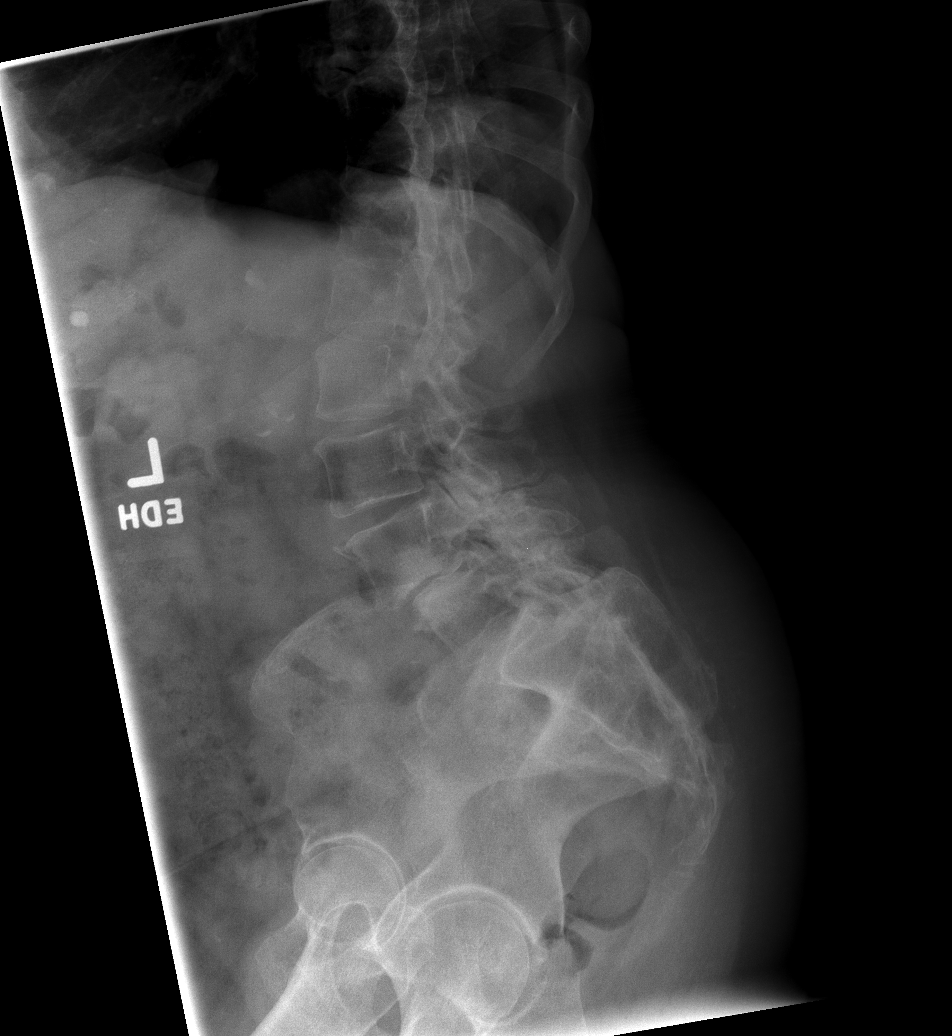

[t l-spine l5-s1 spot]
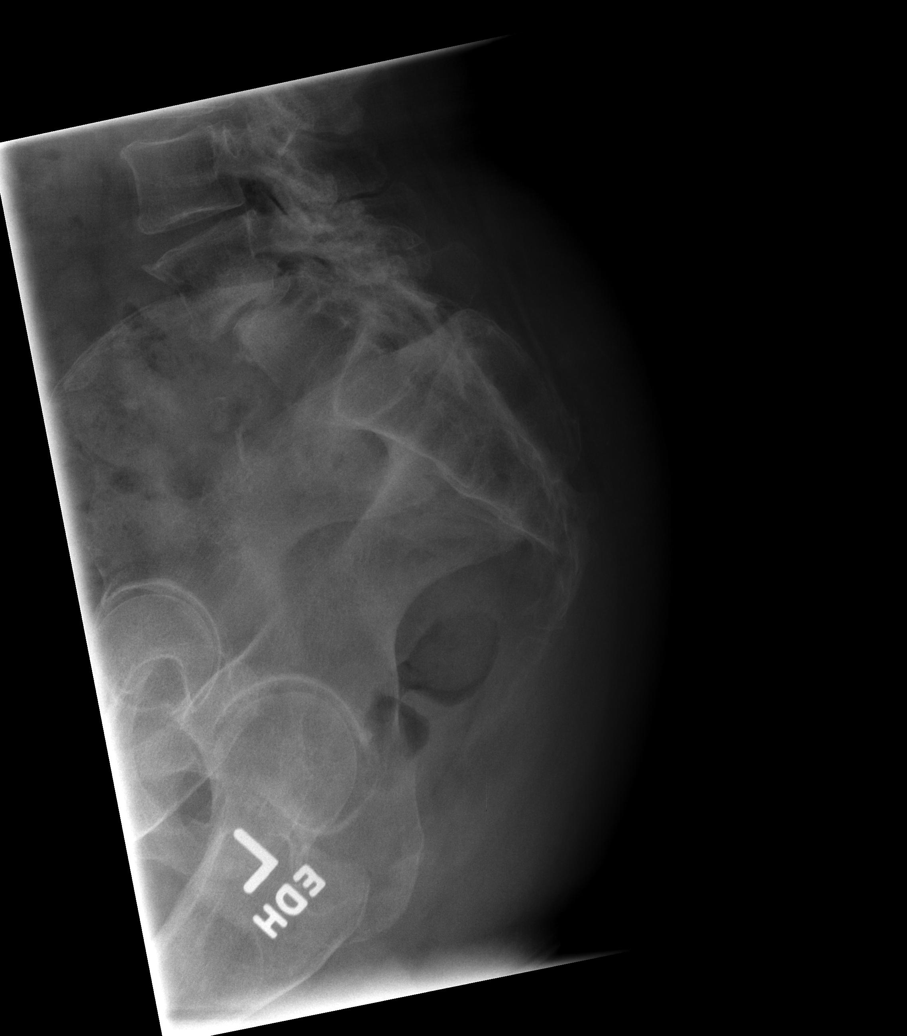

[5 of 5 positions shown; findings below may reference images not displayed]

FINDINGS: Lumbar Spine:

Similar alignment of the lumbar spine with grade 1 anterolisthesis
of L4 on L5.

No acute fracture line identified.

Vertebral body heights maintained.

Disc space narrowing present throughout the lumbar spine with
increasing narrowing and increasing endplate sclerosis at L4-L5.

Facet hypertrophy most pronounced at the L4-L5 and L5-S1 levels.

Oblique images demonstrate no displaced pars defect.

Sclerotic changes of the bilateral sacroiliac joints
IMPRESSION: No acute fracture identified.

Grade 1 anterolisthesis of L4 on L5 is unchanged with worsening disc
disease at the L4-L5 level.

Facet hypertrophy pronounced at the L4-L5 and L5-S1 level.

Bilateral sacroiliac sclerosis.

## 2021-02-02 IMAGING — CR DG THORACIC SPINE 3V
3 series · 3 of 3 positions shown · non-contrast
Comparison: 09/24/2016

CLINICAL DATA: 72-year-old female with worsening back pain

EXAM:
THORACIC SPINE - 3 VIEWS

[t t-spine a.p.]
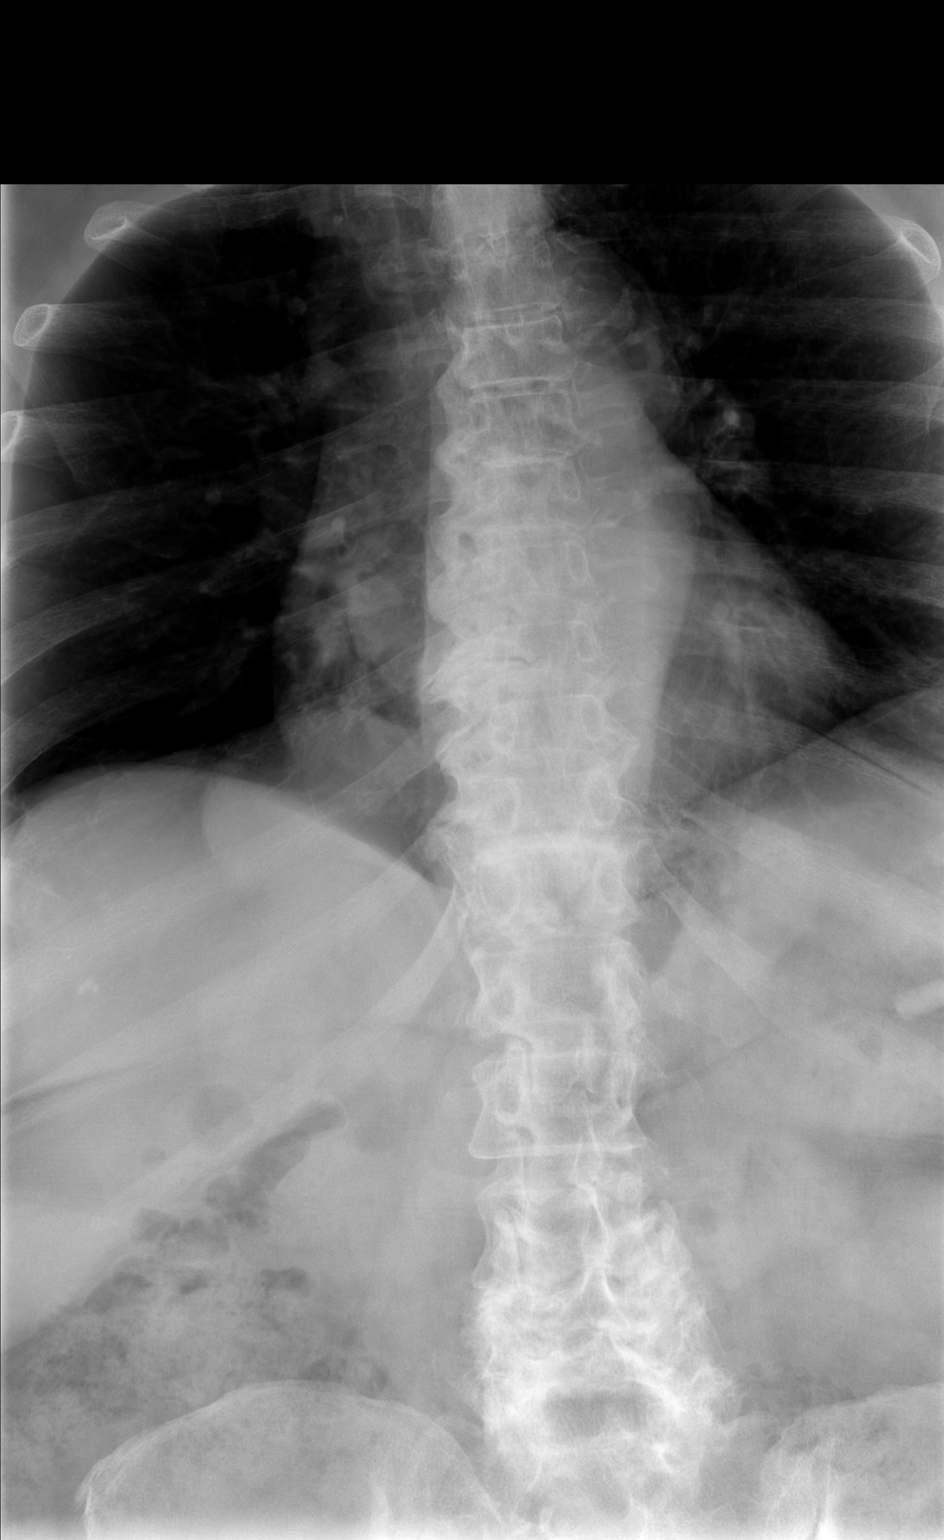

[t t-spine lat]
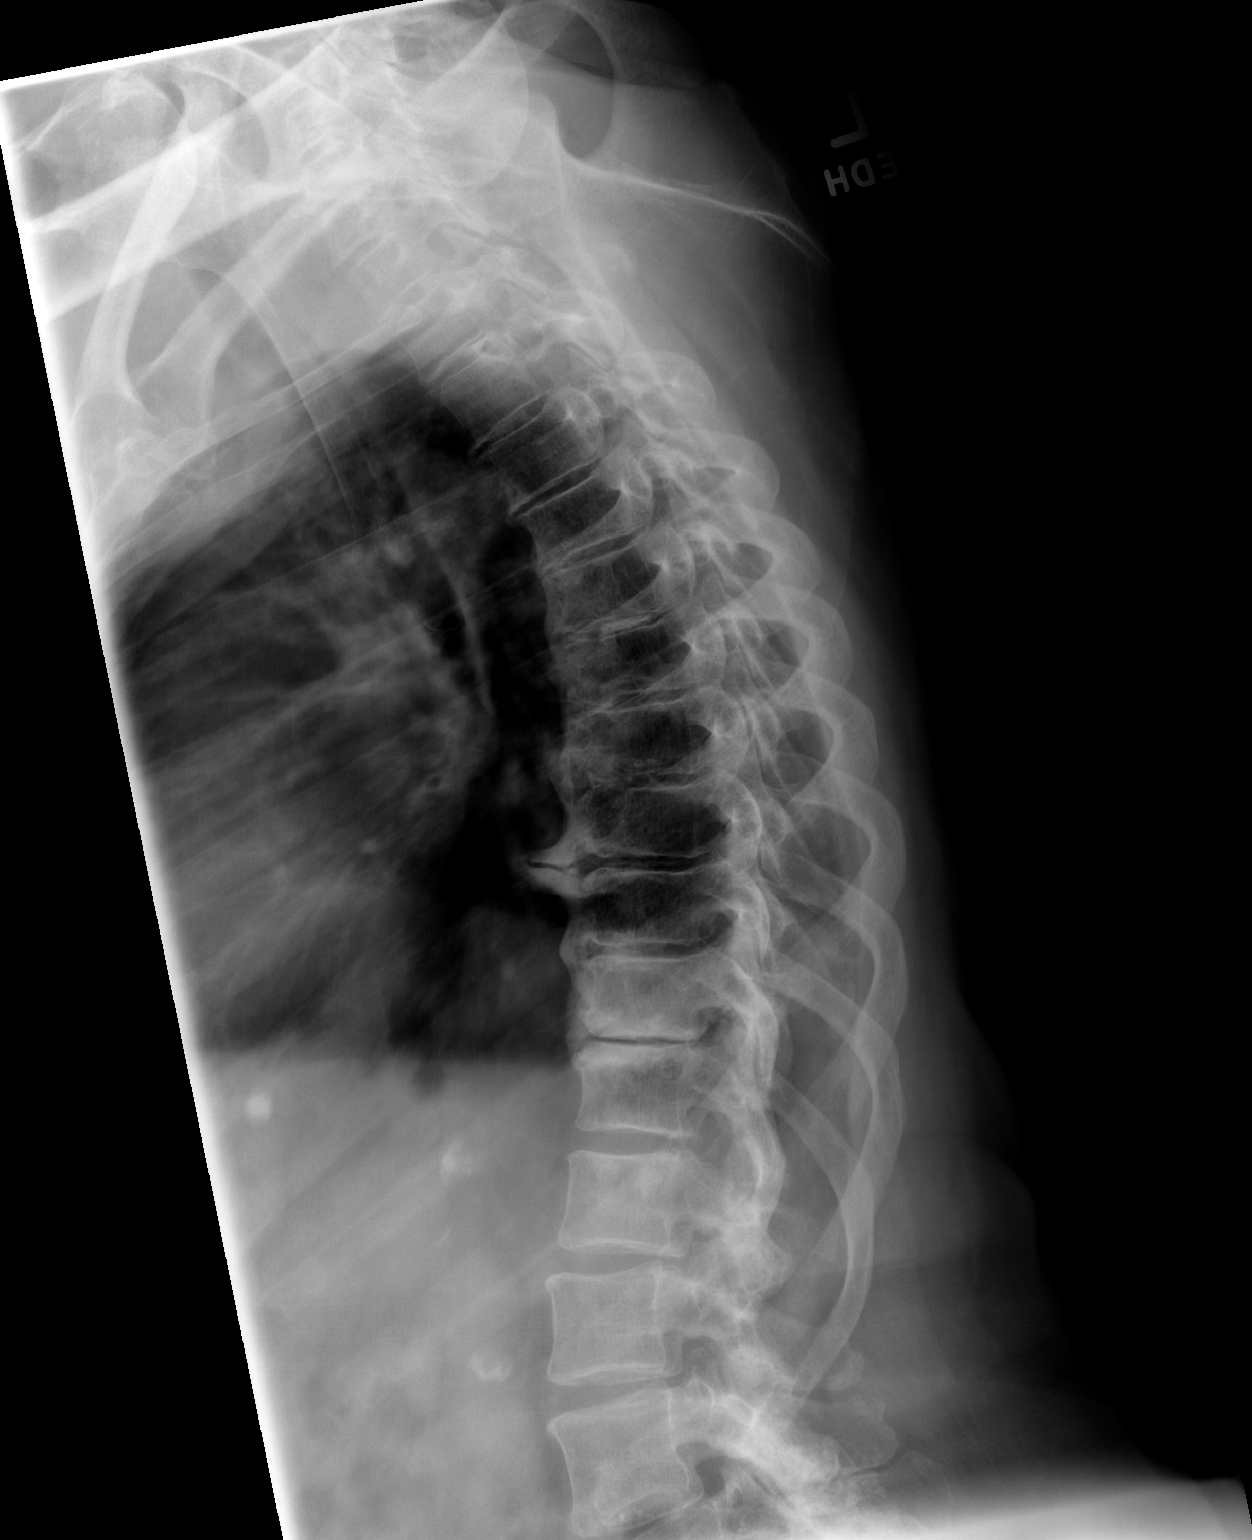

[t swimmers]
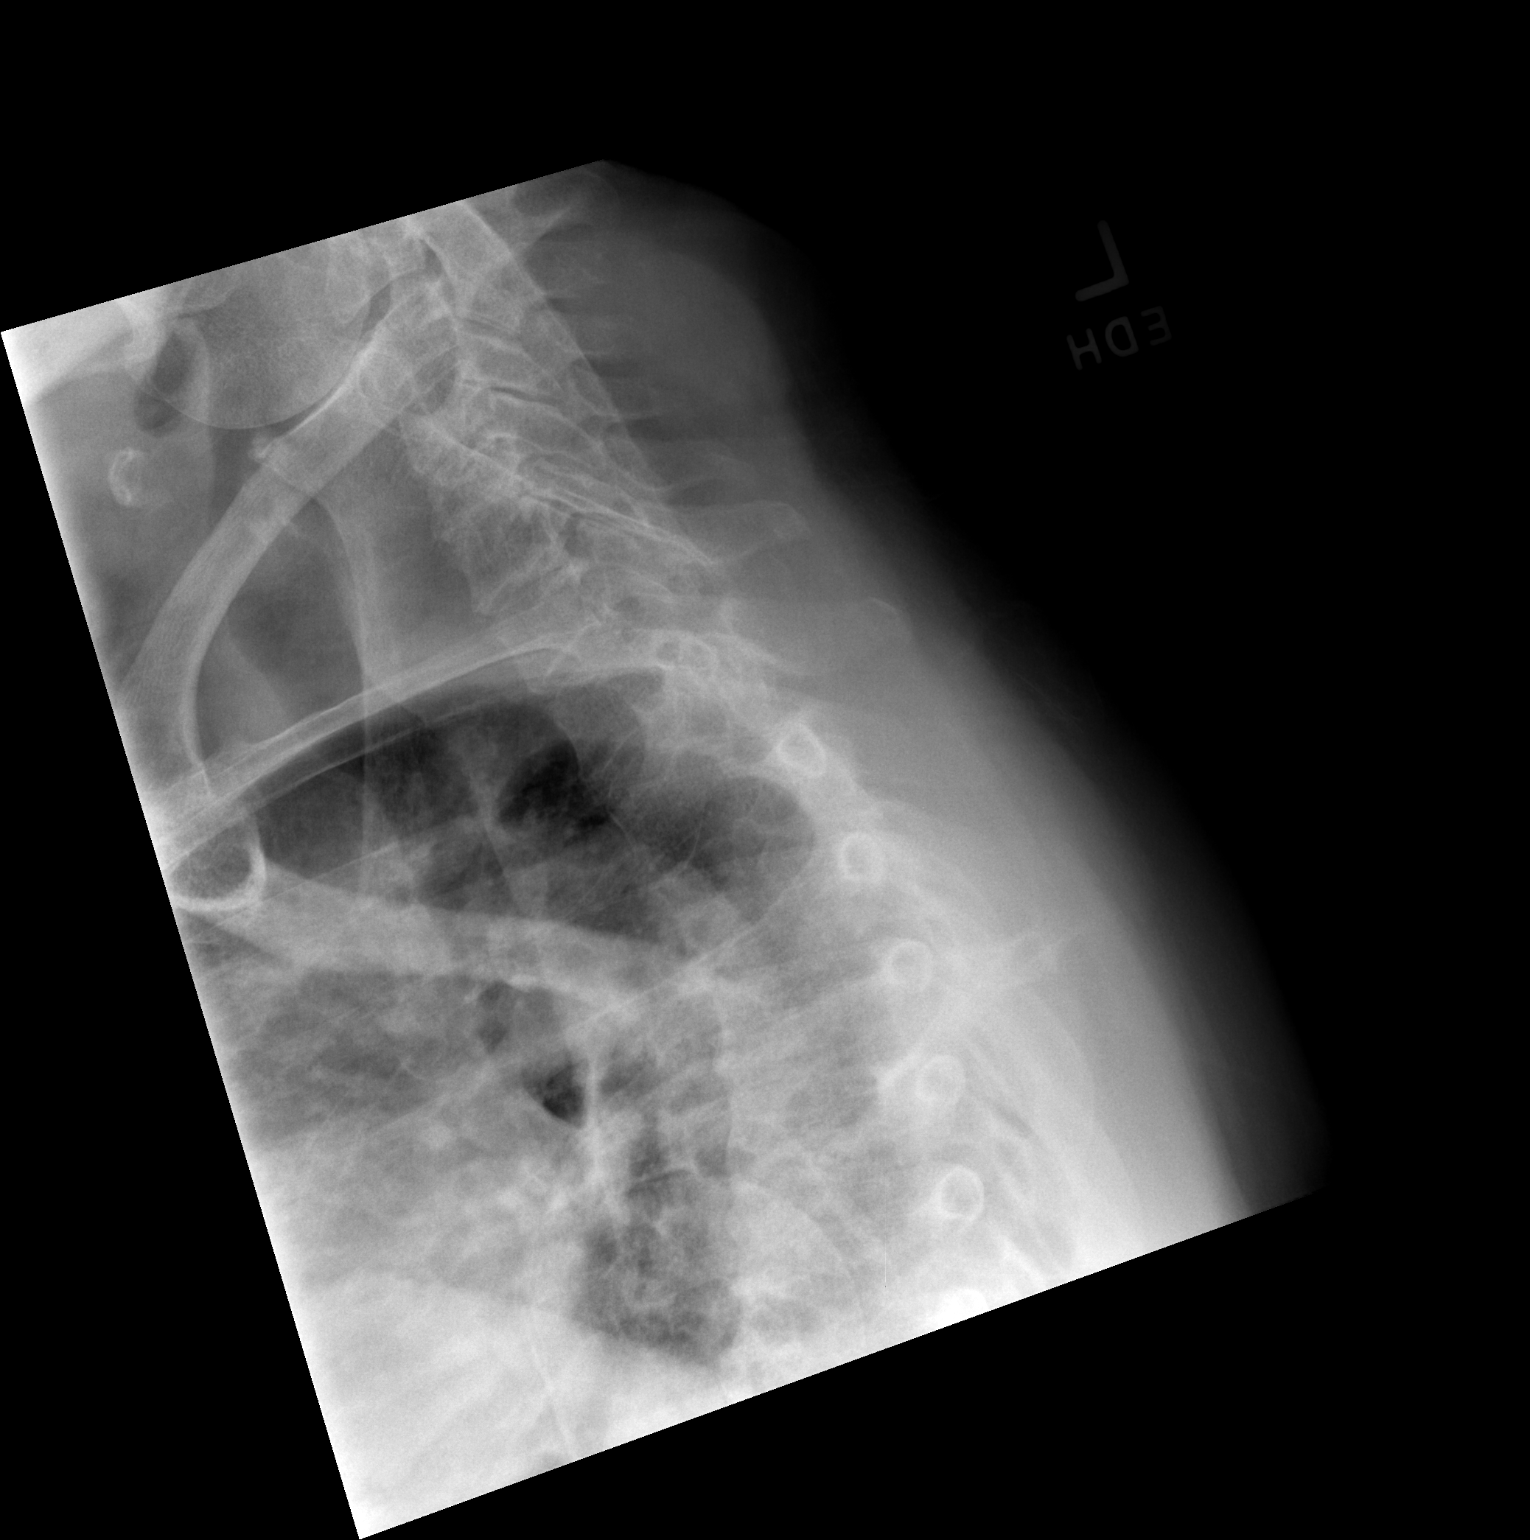

[3 of 3 positions shown; findings below may reference images not displayed]

FINDINGS: Thoracic Spine:

Thoracic vertebral elements maintain normal anatomic alignment, with
no evidence of anterolisthesis, retrolisthesis, or subluxation. Mild
kyphotic deformity

No acute fracture line identified.

Vertebral body heights maintained.

Disc space narrowing with endplate sclerosis, most pronounced
spanning T5-T12. Greatest degree of sclerosis at the T11-T12 level
where there is associated vacuum disc phenomenon. Anterior
osteophyte production spans T5-T12.

Unremarkable appearance of the visualized thorax.
IMPRESSION: Negative for acute fracture of the thoracic spine.

Degenerative disc disease, as above.

## 2021-02-03 DIAGNOSIS — M79604 Pain in right leg: Secondary | ICD-10-CM | POA: Diagnosis not present

## 2021-02-03 DIAGNOSIS — M5459 Other low back pain: Secondary | ICD-10-CM | POA: Diagnosis not present

## 2021-02-04 ENCOUNTER — Telehealth: Payer: Self-pay

## 2021-02-04 NOTE — Chronic Care Management (AMB) (Signed)
Called patient for an appointment reminder with [name], [place] on 02-05-2021 at 4:00. Instructed patient to have all meds/supplements and logs available for review. Patient voiced understanding.  [place]  [phone]

## 2021-02-05 ENCOUNTER — Telehealth: Payer: Self-pay

## 2021-02-05 ENCOUNTER — Telehealth: Payer: Medicare HMO

## 2021-02-05 DIAGNOSIS — M5136 Other intervertebral disc degeneration, lumbar region: Secondary | ICD-10-CM | POA: Diagnosis not present

## 2021-02-05 DIAGNOSIS — M9903 Segmental and somatic dysfunction of lumbar region: Secondary | ICD-10-CM | POA: Diagnosis not present

## 2021-02-05 DIAGNOSIS — M5137 Other intervertebral disc degeneration, lumbosacral region: Secondary | ICD-10-CM | POA: Diagnosis not present

## 2021-02-05 DIAGNOSIS — M9905 Segmental and somatic dysfunction of pelvic region: Secondary | ICD-10-CM | POA: Diagnosis not present

## 2021-02-05 DIAGNOSIS — M47814 Spondylosis without myelopathy or radiculopathy, thoracic region: Secondary | ICD-10-CM | POA: Diagnosis not present

## 2021-02-05 DIAGNOSIS — M9902 Segmental and somatic dysfunction of thoracic region: Secondary | ICD-10-CM | POA: Diagnosis not present

## 2021-02-05 NOTE — Chronic Care Management (AMB) (Signed)
02/05/2021- Patient called back to reschedule appointment for today at 4pm. Patient stated she had another appointment come up at 5 pm and the location is across town. Patient rescheduled for 02/20/2021 @ 345pm, [name], CPP notified and Laverda Sorenson, [place] scheduler requested to move appointment.  Pattricia Boss, [place]

## 2021-02-06 DIAGNOSIS — M5459 Other low back pain: Secondary | ICD-10-CM | POA: Diagnosis not present

## 2021-02-06 DIAGNOSIS — M79604 Pain in right leg: Secondary | ICD-10-CM | POA: Diagnosis not present

## 2021-02-10 DIAGNOSIS — M5459 Other low back pain: Secondary | ICD-10-CM | POA: Diagnosis not present

## 2021-02-10 DIAGNOSIS — M79604 Pain in right leg: Secondary | ICD-10-CM | POA: Diagnosis not present

## 2021-02-12 ENCOUNTER — Other Ambulatory Visit: Payer: Self-pay | Admitting: Internal Medicine

## 2021-02-13 DIAGNOSIS — M79604 Pain in right leg: Secondary | ICD-10-CM | POA: Diagnosis not present

## 2021-02-13 DIAGNOSIS — M5459 Other low back pain: Secondary | ICD-10-CM | POA: Diagnosis not present

## 2021-02-17 DIAGNOSIS — M79604 Pain in right leg: Secondary | ICD-10-CM | POA: Diagnosis not present

## 2021-02-17 DIAGNOSIS — M5459 Other low back pain: Secondary | ICD-10-CM | POA: Diagnosis not present

## 2021-02-19 ENCOUNTER — Telehealth: Payer: Self-pay

## 2021-02-19 DIAGNOSIS — M5459 Other low back pain: Secondary | ICD-10-CM | POA: Diagnosis not present

## 2021-02-19 DIAGNOSIS — M79604 Pain in right leg: Secondary | ICD-10-CM | POA: Diagnosis not present

## 2021-02-19 NOTE — Chronic Care Management (AMB) (Signed)
  Patient aware of telephone appointment on 02-20-2021 at 3:45. Patient aware to have/bring all medications, supplements, blood pressure and/or blood sugar logs to visit.  Questions: Have you had any recent office visit or specialist visit outside of [place]? Patient stated No.  Are there any concerns you would like to discuss during your office visit? Patient stated no.  Are you having any problems obtaining your medications? Patient stated no  If patient has any PAP medications ask if they are having any problems getting their PAP medication or refill?  Star Rating Drug: Irbesartan 300 MG- Last filled 01-07-2021 90 DS [facility] Atorvastatin 80 MG- Last filled 01-11-2021 90 DS [facility]  Any gaps in medications fill history? No  NOTES Patient stated she completed her month course of a heart monitor which ended 02-19-2021. Patient stated she is attending physical therapy twice weekly for lower right back pain.  [place] Pharmacist Assistant [phone]

## 2021-02-20 ENCOUNTER — Ambulatory Visit: Payer: Medicare HMO

## 2021-02-20 DIAGNOSIS — D508 Other iron deficiency anemias: Secondary | ICD-10-CM

## 2021-02-20 DIAGNOSIS — N1831 Chronic kidney disease, stage 3a: Secondary | ICD-10-CM

## 2021-02-20 DIAGNOSIS — I129 Hypertensive chronic kidney disease with stage 1 through stage 4 chronic kidney disease, or unspecified chronic kidney disease: Secondary | ICD-10-CM

## 2021-02-20 DIAGNOSIS — E782 Mixed hyperlipidemia: Secondary | ICD-10-CM

## 2021-02-20 DIAGNOSIS — E1122 Type 2 diabetes mellitus with diabetic chronic kidney disease: Secondary | ICD-10-CM

## 2021-02-20 DIAGNOSIS — N183 Chronic kidney disease, stage 3 unspecified: Secondary | ICD-10-CM

## 2021-02-20 DIAGNOSIS — I10 Essential (primary) hypertension: Secondary | ICD-10-CM | POA: Diagnosis not present

## 2021-02-20 NOTE — Progress Notes (Signed)
Chronic Care Management Pharmacy Note  02/25/2021 Name:  Hannah [name] MRN:  [id number] DOB:  11-Nov-1945  Subjective: Hannah [name] is an 75 y.o. year old female who is a primary patient of Glendale Chard, MD.  The CCM team was consulted for assistance with disease management and care coordination needs.  Patient reports that she is currently being seen by Dr. Grayland Jack in August. She went to be seen on 01/20/2021 she had a monitor placed on her chest, and she had it on for a month, and sent it back to the company. She reports going to [facility] and she is starting to feel better. Make sure to call land line first. Patient is moving forward with her knee replacement.   Engaged with patient by telephone for follow up visit in response to provider referral for pharmacy case management and/or care coordination services.   Consent to Services:  The patient was given information about Chronic Care Management services, agreed to services, and gave verbal consent prior to initiation of services.  Please see initial visit note for detailed documentation.   Patient Care Team: Glendale Chard, MD as PCP - General (Internal Medicine) Nahser, Wonda Cheng, MD as PCP - Cardiology (Cardiology) Lynne Logan, RN as [place] Management  Recent office visits: 01/27/2021 PCP OV  Recent consult visits: 01/08/2021 Rheumatology [facility] visits: None in previous 6 months   Objective:  Lab Results  Component Value Date   CREATININE 1.37 (H) 12/19/2020   BUN 18 12/19/2020   GFRNONAA 38 (L) 12/19/2020   GFRAA 44 (L) 12/19/2020   NA 140 12/19/2020   K 4.7 12/19/2020   CALCIUM 9.7 12/30/2020   CO2 29 12/19/2020   GLUCOSE 128 (H) 12/19/2020    Lab Results  Component Value Date/Time   HGBA1C 5.6 12/30/2020 12:54 PM   HGBA1C 5.6 10/24/2020 11:05 AM   MICROALBUR 150 12/30/2020 01:02 PM   MICROALBUR 80 10/31/2019 12:13 PM    Last diabetic Eye  exam:  Lab Results  Component Value Date/Time   HMDIABEYEEXA No Retinopathy 09/12/2020 12:00 AM    Last diabetic Foot exam: No results found for: HMDIABFOOTEX   Lab Results  Component Value Date   CHOL 153 12/30/2020   HDL 75 12/30/2020   LDLCALC 67 12/30/2020   TRIG 50 12/30/2020   CHOLHDL 2.0 12/30/2020    Hepatic Function Latest Ref Rng & Units 12/30/2020 12/19/2020 12/02/2020  Total Protein 6.0 - 8.5 g/dL 7.1 6.2 6.3  Albumin 3.7 - 4.7 g/dL - - -  AST 10 - 35 U/L - 18 18  ALT 6 - 29 U/L - 11 13  Alk Phosphatase 44 - 121 IU/L - - -  Total Bilirubin 0.2 - 1.2 mg/dL - 0.4 0.6    Lab Results  Component Value Date/Time   TSH 0.666 12/30/2020 12:54 PM   TSH 1.140 03/20/2020 04:45 PM   FREET4 1.44 12/30/2020 12:54 PM   FREET4 1.52 03/20/2020 04:45 PM    CBC Latest Ref Rng & Units 12/19/2020 12/02/2020 08/14/2020  WBC 3.8 - 10.8 Thousand/uL 5.2 4.8 4.8  Hemoglobin 11.7 - 15.5 g/dL 9.9(L) 10.4(L) 10.4(L)  Hematocrit 35.0 - 45.0 % 30.2(L) 31.9(L) 31.6(L)  Platelets 140 - 400 Thousand/uL 184 162 180    Lab Results  Component Value Date/Time   VD25OH 50 12/02/2020 01:50 PM   VD25OH 41 11/14/2019 01:17 PM    Clinical ASCVD: No  The 10-year ASCVD risk score (  Renaye Rakers., et al., 2013) is: 26.6%   Values used to calculate the score:     Age: 45 years     Sex: Female     Is Non-Hispanic African American: Yes     Diabetic: Yes     Tobacco smoker: No     Systolic Blood Pressure: 308 mmHg     Is BP treated: Yes     HDL Cholesterol: 75 mg/dL     Total Cholesterol: 153 mg/dL    Depression screen [facility] 2/9 06/19/2020 06/19/2020 06/15/2019  Decreased Interest 0 0 0  Down, Depressed, Hopeless 0 0 0  PHQ - 2 Score 0 0 0  Altered sleeping - - 3  Tired, decreased energy - - 3  Change in appetite - - 0  Feeling bad or failure about yourself  - - 0  Trouble concentrating - - 0  Moving slowly or fidgety/restless - - 0  Suicidal thoughts - - 0  PHQ-9 Score - - 6  Difficult doing  work/chores - - Not difficult at all  Some recent data might be hidden     Social History   Tobacco Use  Smoking Status Never Smoker  Smokeless Tobacco Never Used   BP Readings from Last 3 Encounters:  01/27/21 118/72  01/08/21 135/73  12/30/20 136/78   Pulse Readings from Last 3 Encounters:  01/27/21 72  01/08/21 68  12/30/20 69   Wt Readings from Last 3 Encounters:  01/27/21 157 lb 9.6 oz (71.5 kg)  01/08/21 159 lb (72.1 kg)  12/30/20 154 lb 6.4 oz (70 kg)   BMI Readings from Last 3 Encounters:  01/27/21 34.10 kg/m  01/08/21 34.41 kg/m  12/30/20 33.41 kg/m    Assessment/Interventions: Review of patient past medical history, allergies, medications, health status, including review of consultants reports, laboratory and other test data, was performed as part of comprehensive evaluation and provision of chronic care management services.   SDOH:  (Social Determinants of Health) assessments and interventions performed: No  SDOH Screenings   Alcohol Screen: Not on file  Depression (PHQ2-9): Low Risk   . PHQ-2 Score: 0  Financial Resource Strain: Low Risk   . Difficulty of Paying Living Expenses: Not hard at all  Food Insecurity: No Food Insecurity  . Worried About Charity fundraiser in the Last Year: Never true  . Ran Out of Food in the Last Year: Never true  Housing: Not on file  Physical Activity: Sufficiently Active  . Days of Exercise per Week: 4 days  . Minutes of Exercise per Session: 40 min  Social Connections: Not on file  Stress: Stress Concern Present  . Feeling of Stress : To some extent  Tobacco Use: Low Risk   . Smoking Tobacco Use: Never Smoker  . Smokeless Tobacco Use: Never Used  Transportation Needs: No Transportation Needs  . Lack of Transportation (Medical): No  . Lack of Transportation (Non-Medical): No    CCM Care Plan  Allergies  Allergen Reactions  . Shellfish Allergy Anaphylaxis and Hives    Medications Reviewed Today     Reviewed by Mayford Knife, RPH (Pharmacist) on 02/20/21 at 1608  Med List Status: <None>  Medication Order Taking? Sig Documenting Provider Last Dose Status Informant  Accu-Chek Softclix Lancets lancets [id number] Yes Use to check blood sugars daily E11.69 Glendale Chard, MD Taking Active   albuterol (PROVENTIL HFA;VENTOLIN HFA) 108 (90 Base) MCG/ACT inhaler [id number] Yes Inhale 2 puffs into the lungs every 6 (six)  hours as needed for wheezing or shortness of breath. Minette Brine, FNP Taking Active   Alcohol Swabs (ALCOHOL PADS) 70 % PADS [id number]  Use as directed to check blood sugars 1 time per day dx: e11.22 Glendale Chard, MD  Active   allopurinol (ZYLOPRIM) 100 MG tablet [id number]  TAKE 1 TABLET BY MOUTH EVERY DAY FOR GOUT Glendale Chard, MD  Active   amLODipine (NORVASC) 2.5 MG tablet [id number]  TAKE 1 TABLET(2.5 MG) BY MOUTH DAILY Glendale Chard, MD  Active   amLODipine (NORVASC) 5 MG tablet [id number]  TAKE 1/2 TABLET BY MOUTH EVERY MORNING AND 1 TABLET EVERY Fredric Mare, MD  Active   Ascorbic Acid (VITAMIN C) 1000 MG tablet [id number]  Take 1,000 mg by mouth daily. [provider]  Active   aspirin EC 81 MG tablet [id number]  Take 81 mg by mouth daily. [provider]  Active Self  atorvastatin (LIPITOR) 80 MG tablet [id number]  TAKE 1 TABLET BY MOUTH EVERY DAY Glendale Chard, MD  Active   Blood Glucose Calibration (TRUE METRIX LEVEL 1) Low SOLN [id number]  Use as directed to check blood sugars 1 time per day dx: e11.22  Patient not taking: No sig reported   Glendale Chard, MD  Active   budesonide-formoterol [facility]) 160-4.5 MCG/ACT inhaler [id number]  INHALE 2 PUFFS BY MOUTH TWICE DAILY IN THE MORNING AND EVENING  Patient taking differently: INHALE 2 PUFFS BY MOUTH TWICE DAILY IN THE MORNING AND EVENING AS NEEDED   Glendale Chard, MD  Active   Calcium Carb-Cholecalciferol 600-800 MG-UNIT TABS [id number] Yes Take 1 tablet by mouth daily.  [provider] Taking Active   Cholecalciferol (VITAMIN D PO) [id number] Yes Take 2,000 Units by mouth daily.  [provider] Taking Active   cyanocobalamin 1000 MCG tablet [id number] Yes Take 1,000 mcg by mouth daily. [provider] Taking Active   denosumab (PROLIA) 60 MG/ML SOSY injection [id number] Yes INJECT 60 MG INTO THE SKIN EVERY 6 (SIX) MONTHS. Bo Merino, MD Taking Active   glucose blood (ACCU-CHEK GUIDE) test strip [id number] Yes Use to check blood sugars daily E11.69 Glendale Chard, MD Taking Active   hydroxychloroquine (PLAQUENIL) 200 MG tablet [id number] Yes TAKE 1 TABLET BY MOUTH DAILY FOR RHEUMATOID ARTHRITIS Ofilia Neas, PA-C Taking Active   Hypromellose (ARTIFICIAL TEARS OP) [id number] Yes Apply to eye as needed. [provider] Taking Active   irbesartan (AVAPRO) 300 MG tablet [id number] Yes TAKE 1 TABLET(300 MG) BY MOUTH DAILY Glendale Chard, MD Taking Active   iron polysaccharides (NIFEREX) 150 MG capsule [id number] Yes Take 150 mg by mouth daily. [provider] Taking Active            Med Note (LITTLE, ANGEL L   Fri Nov 01, 2020  1:05 PM)    SYNTHROID 88 MCG tablet [id number] Yes TAKE 1 TABLET BY MOUTH EVERY DAY MONDAY TO SATURDAY AND OFF ON Nemiah Commander, MD Taking Active   tacrolimus (PROTOPIC) 0.1 % ointment [id number] Yes Apply topically daily. Warren Danes, PA-C Taking Active           Patient Active Problem List   Diagnosis Date Noted  . PVC (premature ventricular contraction) 05/06/2020  . Type 2 diabetes mellitus with stage 3 chronic kidney disease, without long-term current use of insulin ([place]) 03/20/2020  . Palpitations 03/20/2020  . 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/2020  . Primary hypothyroidism 03/20/2020  . Vitiligo 03/20/2020  . Iron deficiency anemia 08/12/2017  . Osteoporosis 10/02/2016  .  Systemic lupus erythematosus ([place]) 10/02/2016  . Rheumatoid arthritis involving multiple sites with positive rheumatoid  factor ([place]) 10/02/2016  . High risk medication use 10/02/2016  . History of chronic kidney disease 10/02/2016  . 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/2016  . Primary osteoarthritis of both knees 10/02/2016  . Vitamin D deficiency 10/02/2016  . History of diabetes mellitus 10/02/2016  . History of hypertension 10/02/2016  . History of asthma 10/02/2016  . Absolute anemia   . MGUS (monoclonal gammopathy of unknown significance)   . Normocytic anemia 03/21/2015  . Essential hypertension 03/20/2015  . Abnormal CT of the chest 07/30/2012  . Asthma 06/19/2012    Immunization History  Administered Date(s) Administered  . 19-influenza Whole 07/15/2012  . DTaP 06/18/2019  . Fluad Quad(high Dose 65+) 06/19/2020  . Influenza Split 06/28/2014  . Influenza, High Dose Seasonal PF 05/18/2019  . Influenza,inj,Quad PF,6+ Mos 06/20/2018  . Moderna SARS-COV2 Booster Vaccination 01/01/2021  . Moderna Sars-Covid-2 Vaccination 11/20/2019, 12/19/2019, 08/06/2020  . Pneumococcal Conjugate-13 05/04/2018  . Pneumococcal Polysaccharide-23 05/18/2019  . Pneumococcal-Unspecified 06/28/2014  . Tdap 06/15/2019    Conditions to be addressed/monitored:  Hypertension, Hyperlipidemia and Diabetes  Care Plan : [place]  Updates made by Mayford Knife, [place] since 02/25/2021 12:00 AM    Problem: HTN, HLD, DM, Rheumatoid Arthritis   Priority: High    Long-Range Goal: Disease Management   Recent Progress: On track  Priority: High  Note:    Current Barriers:  . Continue to manage glucose levels and take medication on a routine schedule.   Pharmacist Clinical Goal(s):  [name] Over the next 90 days, patient will achieve adherence to monitoring guidelines and medication adherence to achieve therapeutic efficacy through collaboration with PharmD and provider.   Interventions: . 1:1 collaboration with Glendale Chard, MD regarding development and update of comprehensive  plan of care as evidenced by provider attestation and co-signature . Inter-disciplinary care team collaboration (see longitudinal plan of care) . Comprehensive medication review performed; medication list updated in electronic medical record  Hypertension (BP goal <130/80) -Controlled -Current treatment: . Irbersartan 300 mg tablet once per day.  . Amlodipine 5 mg tablet once per day. . Amlodipine 2.5 mg tablet once day in the morning.  -Current home readings: 131/75, 113/59 -Current dietary habits: does not eat any salt  -Current exercise habits: patient is very active, yoga on Monday and Friday, and weights and bands on Tuesday and Friday, on fridays she has two classes.  -Denies hypotensive/hypertensive symptoms -Educated on BP goals and benefits of medications for prevention of heart attack, stroke and kidney damage; Daily salt intake goal < 2300 mg; Exercise goal of 150 minutes per week; Symptoms of hypotension and importance of maintaining adequate hydration; -Counseled to monitor BP at home at least four times per week, document, and provide log at future appointments -Recommended to continue current medication -Will consult with PCP team to confirm patients current Amlodipine daily dosing and follow up with patient.   Hyperlipidemia: (LDL goal < 70) -controlled -Current treatment: . Atorvastatin 80 mg - tablet daily . Aspirin 81 mg taking daily    -Current exercise habits: Monday, Tuesday and Friday exercise classes. On Friday she takes two classes.  -Educated on Importance of limiting foods high in cholesterol; -Recommended to continue current medication  Diabetes (A1c goal <7%) -controlled -Current medications: . None at this time  -Current home glucose readings - checking everyday  . fasting glucose: 84, 81,85,  75,  48  -Denies hypoglycemic/hyperglycemic symptoms -Current meal patterns:  . breakfast: yogurt, or a bowl of grapefruit, winter cream of wheat with milk,  wheat type of cereal   . Dinner- last meal at 2 or 3: Eating more salmon, and asparagus - already prepared at [employer] , eating a lot of chicken and sometimes pork  o She tries to make sure that she digests her food because she goes to bed early  . drinks: water  -Current exercise habits: patient is very active, yoga on Monday and Friday, and weights and bands on Tuesday and Friday, on fridays she has two classes. -Educated on A1c and blood sugar goals; Exercise goal of 150 minutes per week; Prevention and management of hypoglycemic episodes; Carbohydrate counting and/or plate method  -Patient is going to try drink 8 glasses of water per day.  -Patient reports that sometimes she feels like her feet numb.  -Counseled to check feet daily and get yearly eye exams -Counseled on diet and exercise extensively  Rheumatoid Arthritis/ Lupus  (Goal: To reduce symptoms of Rheumatoid Arthritis and Lupus) -controlled -Current treatment  . Hydroxychloroquin 200 mg - take 1 tablet by mouth daily  . Allopurinol 100 mg tablet daily  -Patient seen by rheumatologist on regular basis -Recommended to continue current medication   Patient Goals/Self-Care Activities . Over the next 90 days, patient will:  - take medications as prescribed  Follow Up Plan: Telephone follow up appointment with care management team member scheduled for: The patient has been provided with contact information for the care management team and has been advised to call with any health related questions or concerns.       Medication Assistance: None required.  Patient affirms current coverage meets needs.  Compliance/Adherence/Medication fill history: Care Gaps: Shingles vaccine   Star-Rating Drugs: Irbesartan 300 mg tablet once per day.  Atorvastatin 80 mg tablet every day.   Patient's preferred pharmacy is:  [id number] [place], [place] - [address] AT [place] [address] Phone: [phone] Fax: [place] [address] Phone: [phone] Fax: [place] [address] [place] [address] Phone: [phone] Fax: [phone]  Uses pill box? Yes Pt endorses 90% compliance  We discussed: Benefits of medication synchronization, packaging and delivery as well as enhanced pharmacist oversight with [facility]. Patient decided to: Continue current medication management strategy  Care Plan and Follow Up Patient Decision:  Patient agrees to Care Plan and Follow-up.  Plan: Telephone follow up appointment with care management team member scheduled for:  05/21/2021 and The patient has been provided with contact information for the care management team and has been advised to call with any health related questions or concerns.   [name], PharmD Clinical Pharmacist [facility] [phone]

## 2021-02-25 DIAGNOSIS — M5459 Other low back pain: Secondary | ICD-10-CM | POA: Diagnosis not present

## 2021-02-25 DIAGNOSIS — M79604 Pain in right leg: Secondary | ICD-10-CM | POA: Diagnosis not present

## 2021-02-25 NOTE — Patient Instructions (Addendum)
Visit Information It was great speaking with you today!  Please let me know if you have any questions about our visit.  Goals Addressed            This Visit's Progress   . Track and Manage My Blood Pressure-Hypertension       Timeframe:  Long-Range Goal Priority:  Medium Start Date:  01/13/21                          Expected End Date: 07/15/21                     Follow Up Date: 04/14/21   Self-Care Activities: Self administers medications as prescribed Attends all scheduled provider appointments Calls provider office for new concerns, questions, or BP outside discussed parameters Checks BP and records as discussed Follows a low sodium diet/DASH diet Patient Goals: - check blood pressure 3 times per week - choose a place to take my blood pressure (home, clinic or office, retail store) - write blood pressure results in a log or diary - learn about high blood pressure   Why is this important?    You won't feel high blood pressure, but it can still hurt your blood vessels.   Making lifestyle changes like losing a little weight or eating less salt will help.   Checking your blood pressure at home and at different times of the day can help to control blood pressure.   If the doctor prescribes medicine remember to take it the way the doctor ordered.   Call the office if you cannot afford the medicine or if there are questions about it.            Problem Identified: HTN, HLD, DM, Rheumatoid Arthritis   Priority: High    Long-Range Goal: Disease Management   Recent Progress: On track  Priority: High  Note:    Current Barriers:  . Continue to manage glucose levels and take medication on a routine schedule.   Pharmacist Clinical Goal(s):  [name] Over the next 90 days, patient will achieve adherence to monitoring guidelines and medication adherence to achieve therapeutic efficacy through collaboration with PharmD and provider.   Interventions: . 1:1 collaboration with  Glendale Chard, MD regarding development and update of comprehensive plan of care as evidenced by provider attestation and co-signature . Inter-disciplinary care team collaboration (see longitudinal plan of care) . Comprehensive medication review performed; medication list updated in electronic medical record  Hypertension (BP goal <130/80) -Controlled -Current treatment: . Irbersartan 300 mg tablet once per day.  . Amlodipine 5 mg tablet once per day. -Current home readings: 131/75, 113/59 -Current dietary habits: does not eat any salt  -Current exercise habits: patient is very active, yoga on Monday and Friday, and weights and bands on Tuesday and Friday, on fridays she has two classes.  -Denies hypotensive/hypertensive symptoms -Educated on BP goals and benefits of medications for prevention of heart attack, stroke and kidney damage; Daily salt intake goal < 2300 mg; Exercise goal of 150 minutes per week; Symptoms of hypotension and importance of maintaining adequate hydration; -Counseled to monitor BP at home at least four times per week, document, and provide log at future appointments -Recommended to continue current medication  Hyperlipidemia: (LDL goal < 70) -controlled -Current treatment: . Atorvastatin 80 mg - tablet daily . Aspirin 81 mg taking daily    -Current exercise habits: Monday, Tuesday and Friday exercise classes. On Friday  she takes two classes.  -Educated on Importance of limiting foods high in cholesterol; -Recommended to continue current medication  Diabetes (A1c goal <7%) -controlled -Current medications: . None at this time  -Current home glucose readings - checking everyday  . fasting glucose: 84, 81,85,  75, 77  -Denies hypoglycemic/hyperglycemic symptoms -Current meal patterns:  . breakfast: yogurt, or a bowl of grapefruit, winter cream of wheat with milk, wheat type of cereal   . Dinner- last meal at 2 or 3: Eating more salmon, and asparagus -  already prepared at [employer] , eating a lot of chicken and sometimes pork  o She tries to make sure that she digests her food because she goes to bed early  . drinks: water  -Current exercise habits: patient is very active, yoga on Monday and Friday, and weights and bands on Tuesday and Friday, on fridays she has two classes. -Educated on A1c and blood sugar goals; Exercise goal of 150 minutes per week; Prevention and management of hypoglycemic episodes; Carbohydrate counting and/or plate method  -Patient is going to try drink 8 glasses of water per day.  -Patient reports that sometimes she feels like her feet numb.  -Counseled to check feet daily and get yearly eye exams -Counseled on diet and exercise extensively  Rheumatoid Arthritis/ Lupus  (Goal: To reduce symptoms of Rheumatoid Arthritis and Lupus) -controlled -Current treatment  . Hydroxychloroquin 200 mg - take 1 tablet by mouth daily  . Allopurinol 100 mg tablet daily  -Patient seen by rheumatologist on regular basis -Recommended to continue current medication   Patient Goals/Self-Care Activities . Over the next 90 days, patient will:  - take medications as prescribed  Follow Up Plan: Telephone follow up appointment with care management team member scheduled for: 05/21/2021 The patient has been provided with contact information for the care management team and has been advised to call with any health related questions or concerns.       Patient agreed to services and verbal consent obtained.   The patient verbalized understanding of instructions, educational materials, and care plan provided today and agreed to receive a mailed copy of patient instructions, educational materials, and care plan.   [name], PharmD Clinical Pharmacist [facility] [phone]

## 2021-02-27 DIAGNOSIS — M5459 Other low back pain: Secondary | ICD-10-CM | POA: Diagnosis not present

## 2021-02-27 DIAGNOSIS — M79604 Pain in right leg: Secondary | ICD-10-CM | POA: Diagnosis not present

## 2021-03-06 DIAGNOSIS — M5136 Other intervertebral disc degeneration, lumbar region: Secondary | ICD-10-CM | POA: Diagnosis not present

## 2021-03-06 DIAGNOSIS — M9902 Segmental and somatic dysfunction of thoracic region: Secondary | ICD-10-CM | POA: Diagnosis not present

## 2021-03-06 DIAGNOSIS — M5137 Other intervertebral disc degeneration, lumbosacral region: Secondary | ICD-10-CM | POA: Diagnosis not present

## 2021-03-06 DIAGNOSIS — M9905 Segmental and somatic dysfunction of pelvic region: Secondary | ICD-10-CM | POA: Diagnosis not present

## 2021-03-06 DIAGNOSIS — M47814 Spondylosis without myelopathy or radiculopathy, thoracic region: Secondary | ICD-10-CM | POA: Diagnosis not present

## 2021-03-06 DIAGNOSIS — M9903 Segmental and somatic dysfunction of lumbar region: Secondary | ICD-10-CM | POA: Diagnosis not present

## 2021-03-10 DIAGNOSIS — M5459 Other low back pain: Secondary | ICD-10-CM | POA: Diagnosis not present

## 2021-03-10 DIAGNOSIS — M79604 Pain in right leg: Secondary | ICD-10-CM | POA: Diagnosis not present

## 2021-03-11 DIAGNOSIS — Z1231 Encounter for screening mammogram for malignant neoplasm of breast: Secondary | ICD-10-CM | POA: Diagnosis not present

## 2021-03-11 LAB — HM MAMMOGRAPHY

## 2021-03-13 ENCOUNTER — Encounter: Payer: Self-pay | Admitting: Internal Medicine

## 2021-03-14 DIAGNOSIS — M79604 Pain in right leg: Secondary | ICD-10-CM | POA: Diagnosis not present

## 2021-03-14 DIAGNOSIS — M5459 Other low back pain: Secondary | ICD-10-CM | POA: Diagnosis not present

## 2021-03-19 ENCOUNTER — Telehealth: Payer: Self-pay

## 2021-03-19 NOTE — Chronic Care Management (AMB) (Signed)
Chronic Care Management Pharmacy Assistant   Name: Hannah [name]  MRN: [id number] DOB: April 09, 1946   Reason for Encounter: Disease State/ Diabetes  Recent office visits:  None  Recent consult visits:  None  Hospital visits:  None in previous 6 months  Medications: Outpatient Encounter Medications as of 03/19/2021  Medication Sig   Accu-Chek Softclix Lancets lancets Use to check blood sugars daily E11.69   albuterol (PROVENTIL HFA;VENTOLIN HFA) 108 (90 Base) MCG/ACT inhaler Inhale 2 puffs into the lungs every 6 (six) hours as needed for wheezing or shortness of breath.   Alcohol Swabs (ALCOHOL PADS) 70 % PADS Use as directed to check blood sugars 1 time per day dx: e11.22   allopurinol (ZYLOPRIM) 100 MG tablet TAKE 1 TABLET BY MOUTH EVERY DAY FOR GOUT   amLODipine (NORVASC) 2.5 MG tablet TAKE 1 TABLET(2.5 MG) BY MOUTH DAILY   amLODipine (NORVASC) 5 MG tablet TAKE 1/2 TABLET BY MOUTH EVERY MORNING AND 1 TABLET EVERY EVENING   Ascorbic Acid (VITAMIN C) 1000 MG tablet Take 1,000 mg by mouth daily.   aspirin EC 81 MG tablet Take 81 mg by mouth daily.   atorvastatin (LIPITOR) 80 MG tablet TAKE 1 TABLET BY MOUTH EVERY DAY   Blood Glucose Calibration (TRUE METRIX LEVEL 1) Low SOLN Use as directed to check blood sugars 1 time per day dx: e11.22 (Patient not taking: No sig reported)   budesonide-formoterol (SYMBICORT) 160-4.5 MCG/ACT inhaler INHALE 2 PUFFS BY MOUTH TWICE DAILY IN THE MORNING AND EVENING (Patient taking differently: INHALE 2 PUFFS BY MOUTH TWICE DAILY IN THE MORNING AND EVENING AS NEEDED)   Calcium Carb-Cholecalciferol 600-800 MG-UNIT TABS Take 1 tablet by mouth daily.    Cholecalciferol (VITAMIN D PO) Take 2,000 Units by mouth daily.    cyanocobalamin 1000 MCG tablet Take 1,000 mcg by mouth daily.   denosumab (PROLIA) 60 MG/ML SOSY injection INJECT 60 MG INTO THE SKIN EVERY 6 (SIX) MONTHS.   glucose blood (ACCU-CHEK GUIDE) test strip Use to check blood sugars daily  E11.69   hydroxychloroquine (PLAQUENIL) 200 MG tablet TAKE 1 TABLET BY MOUTH DAILY FOR RHEUMATOID ARTHRITIS   Hypromellose (ARTIFICIAL TEARS OP) Apply to eye as needed.   irbesartan (AVAPRO) 300 MG tablet TAKE 1 TABLET(300 MG) BY MOUTH DAILY   iron polysaccharides (NIFEREX) 150 MG capsule Take 150 mg by mouth daily.   SYNTHROID 88 MCG tablet TAKE 1 TABLET BY MOUTH EVERY DAY MONDAY TO SATURDAY AND OFF ON SUNDAYS   tacrolimus (PROTOPIC) 0.1 % ointment Apply topically daily.   No facility-administered encounter medications on file as of 03/19/2021.   Recent Relevant Labs: Lab Results  Component Value Date/Time   HGBA1C 5.6 12/30/2020 12:54 PM   HGBA1C 5.6 10/24/2020 11:05 AM   MICROALBUR 150 12/30/2020 01:02 PM   MICROALBUR 80 10/31/2019 12:13 PM    Kidney Function Lab Results  Component Value Date/Time   CREATININE 1.37 (H) 12/19/2020 02:26 PM   CREATININE 1.12 (H) 12/02/2020 01:50 PM   CREATININE 1.4 (H) 08/12/2017 08:08 AM   CREATININE 1.2 (H) 02/16/2017 08:29 AM   GFRNONAA 38 (L) 12/19/2020 02:26 PM   GFRAA 44 (L) 12/19/2020 02:26 PM    Current antihyperglycemic regimen:  None What recent interventions/DTPs have been made to improve glycemic control at last CPP visit: Educated on A1c and blood sugar goals Exercise goal of 150 minutes per week Prevention and management of hypoglycemic episodes Carbohydrate counting and/or plate method Counseled to check feet daily and get  yearly eye exams Counseled on diet and exercise extensively   Have there been any recent hospitalizations or ED visits since last visit with CPP? No  Patient denies hypoglycemic symptoms  Patient denies hyperglycemic symptoms  How often are you checking your blood sugar? once daily  What are your blood sugars ranging?  Fasting: 81, 84, 90, 84, 81 Before meals: None After meals: None Bedtime: None  During the week, how often does your blood glucose drop below 70? Never  Are you checking your feet  daily/regularly? Patient states she checks feet daily.  Adherence Review: Is the patient currently on a STATIN medication? Yes Is the patient currently on ACE/ARB medication? Yes Does the patient have >5 day gap between last estimated fill dates? No   Star Rating Drugs: Irbesartan 300 MG: Last filled 01-07-2021 90 DS [facility] Atorvastatin 80 MG: Last filled 01-11-2021 90 DS [place] Clinical Pharmacist Assistant [phone]

## 2021-03-21 ENCOUNTER — Other Ambulatory Visit: Payer: Self-pay | Admitting: Physician Assistant

## 2021-03-21 DIAGNOSIS — M0579 Rheumatoid arthritis with rheumatoid factor of multiple sites without organ or systems involvement: Secondary | ICD-10-CM

## 2021-03-24 NOTE — Telephone Encounter (Signed)
Last Visit: 01/08/2021 Next Visit: 06/13/2021 Labs: 12/19/2020 Creatinine is elevated-1.37 and GFR is low and trending down-44. Calcium is WNL.  Anemia has worsened likely due to renal function. Eye exam: 09/12/2020   Current Dose per office note on 01/08/2021: Plaquenil 200 mg 1 tablet by mouth daily. UX:[id number] arthritis involving multiple sites with positive rheumatoid factor  Last Fill: 12/23/2020  Okay to refill Plaquenil?

## 2021-03-26 ENCOUNTER — Ambulatory Visit: Payer: Self-pay | Admitting: Nurse Practitioner

## 2021-03-26 ENCOUNTER — Encounter (HOSPITAL_COMMUNITY): Payer: Self-pay | Admitting: Emergency Medicine

## 2021-03-26 ENCOUNTER — Telehealth: Payer: Self-pay

## 2021-03-26 ENCOUNTER — Emergency Department (HOSPITAL_COMMUNITY): Payer: Medicare HMO

## 2021-03-26 ENCOUNTER — Observation Stay (HOSPITAL_COMMUNITY)
Admission: EM | Admit: 2021-03-26 | Discharge: 2021-03-28 | Disposition: A | Discharge status: Home / Self Care | Payer: Medicare HMO | Attending: Internal Medicine | Admitting: Internal Medicine

## 2021-03-26 ENCOUNTER — Telehealth: Payer: Medicare HMO

## 2021-03-26 ENCOUNTER — Other Ambulatory Visit: Payer: Self-pay

## 2021-03-26 DIAGNOSIS — N1832 Chronic kidney disease, stage 3b: Secondary | ICD-10-CM | POA: Diagnosis not present

## 2021-03-26 DIAGNOSIS — I4891 Unspecified atrial fibrillation: Secondary | ICD-10-CM | POA: Diagnosis not present

## 2021-03-26 DIAGNOSIS — I4892 Unspecified atrial flutter: Secondary | ICD-10-CM | POA: Diagnosis not present

## 2021-03-26 DIAGNOSIS — R000 Tachycardia, unspecified: Secondary | ICD-10-CM

## 2021-03-26 DIAGNOSIS — D509 Iron deficiency anemia, unspecified: Secondary | ICD-10-CM | POA: Diagnosis present

## 2021-03-26 DIAGNOSIS — D508 Other iron deficiency anemias: Secondary | ICD-10-CM

## 2021-03-26 DIAGNOSIS — Z7982 Long term (current) use of aspirin: Secondary | ICD-10-CM | POA: Diagnosis not present

## 2021-03-26 DIAGNOSIS — M069 Rheumatoid arthritis, unspecified: Secondary | ICD-10-CM | POA: Diagnosis present

## 2021-03-26 DIAGNOSIS — R404 Transient alteration of awareness: Secondary | ICD-10-CM | POA: Diagnosis not present

## 2021-03-26 DIAGNOSIS — U071 COVID-19: Secondary | ICD-10-CM | POA: Diagnosis not present

## 2021-03-26 DIAGNOSIS — Z794 Long term (current) use of insulin: Secondary | ICD-10-CM | POA: Insufficient documentation

## 2021-03-26 DIAGNOSIS — M0579 Rheumatoid arthritis with rheumatoid factor of multiple sites without organ or systems involvement: Secondary | ICD-10-CM | POA: Diagnosis not present

## 2021-03-26 DIAGNOSIS — R059 Cough, unspecified: Secondary | ICD-10-CM | POA: Diagnosis not present

## 2021-03-26 DIAGNOSIS — E119 Type 2 diabetes mellitus without complications: Secondary | ICD-10-CM | POA: Diagnosis present

## 2021-03-26 DIAGNOSIS — I2489 Other forms of acute ischemic heart disease: Secondary | ICD-10-CM | POA: Diagnosis present

## 2021-03-26 DIAGNOSIS — R778 Other specified abnormalities of plasma proteins: Secondary | ICD-10-CM | POA: Insufficient documentation

## 2021-03-26 DIAGNOSIS — E1122 Type 2 diabetes mellitus with diabetic chronic kidney disease: Secondary | ICD-10-CM | POA: Diagnosis not present

## 2021-03-26 DIAGNOSIS — I10 Essential (primary) hypertension: Secondary | ICD-10-CM | POA: Diagnosis present

## 2021-03-26 DIAGNOSIS — I248 Other forms of acute ischemic heart disease: Secondary | ICD-10-CM | POA: Diagnosis present

## 2021-03-26 DIAGNOSIS — I129 Hypertensive chronic kidney disease with stage 1 through stage 4 chronic kidney disease, or unspecified chronic kidney disease: Secondary | ICD-10-CM | POA: Insufficient documentation

## 2021-03-26 DIAGNOSIS — J45909 Unspecified asthma, uncomplicated: Secondary | ICD-10-CM | POA: Diagnosis present

## 2021-03-26 DIAGNOSIS — R0789 Other chest pain: Secondary | ICD-10-CM | POA: Diagnosis not present

## 2021-03-26 DIAGNOSIS — R001 Bradycardia, unspecified: Secondary | ICD-10-CM | POA: Diagnosis not present

## 2021-03-26 DIAGNOSIS — Z79899 Other long term (current) drug therapy: Secondary | ICD-10-CM | POA: Insufficient documentation

## 2021-03-26 DIAGNOSIS — N1831 Chronic kidney disease, stage 3a: Secondary | ICD-10-CM | POA: Diagnosis not present

## 2021-03-26 DIAGNOSIS — J4520 Mild intermittent asthma, uncomplicated: Secondary | ICD-10-CM | POA: Diagnosis not present

## 2021-03-26 DIAGNOSIS — E039 Hypothyroidism, unspecified: Secondary | ICD-10-CM | POA: Diagnosis not present

## 2021-03-26 DIAGNOSIS — R0602 Shortness of breath: Secondary | ICD-10-CM | POA: Diagnosis not present

## 2021-03-26 DIAGNOSIS — R079 Chest pain, unspecified: Secondary | ICD-10-CM | POA: Diagnosis not present

## 2021-03-26 DIAGNOSIS — Z20822 Contact with and (suspected) exposure to covid-19: Secondary | ICD-10-CM | POA: Diagnosis not present

## 2021-03-26 HISTORY — DX: Vitiligo: L80

## 2021-03-26 LAB — CBC WITH DIFFERENTIAL/PLATELET
Abs Immature Granulocytes: 0.03 10*3/uL (ref 0.00–0.07)
Basophils Absolute: 0 10*3/uL (ref 0.0–0.1)
Basophils Relative: 1 %
Eosinophils Absolute: 0 10*3/uL (ref 0.0–0.5)
Eosinophils Relative: 0 %
HCT: 36.3 % (ref 36.0–46.0)
Hemoglobin: 11.9 g/dL — ABNORMAL LOW (ref 12.0–15.0)
Immature Granulocytes: 1 %
Lymphocytes Relative: 10 %
Lymphs Abs: 0.6 10*3/uL — ABNORMAL LOW (ref 0.7–4.0)
MCH: 32.3 pg (ref 26.0–34.0)
MCHC: 32.8 g/dL (ref 30.0–36.0)
MCV: 98.6 fL (ref 80.0–100.0)
Monocytes Absolute: 0.7 10*3/uL (ref 0.1–1.0)
Monocytes Relative: 12 %
Neutro Abs: 4.7 10*3/uL (ref 1.7–7.7)
Neutrophils Relative %: 76 %
Platelets: 121 10*3/uL — ABNORMAL LOW (ref 150–400)
RBC: 3.68 MIL/uL — ABNORMAL LOW (ref 3.87–5.11)
RDW: 14.1 % (ref 11.5–15.5)
WBC: 6.2 10*3/uL (ref 4.0–10.5)
nRBC: 0 % (ref 0.0–0.2)

## 2021-03-26 LAB — COMPREHENSIVE METABOLIC PANEL
ALT: 17 U/L (ref 0–44)
AST: 26 U/L (ref 15–41)
Albumin: 3.6 g/dL (ref 3.5–5.0)
Alkaline Phosphatase: 40 U/L (ref 38–126)
Anion gap: 11 (ref 5–15)
BUN: 23 mg/dL (ref 8–23)
CO2: 24 mmol/L (ref 22–32)
Calcium: 9.3 mg/dL (ref 8.9–10.3)
Chloride: 102 mmol/L (ref 98–111)
Creatinine, Ser: 1.57 mg/dL — ABNORMAL HIGH (ref 0.44–1.00)
GFR, Estimated: 34 mL/min — ABNORMAL LOW (ref >60)
Glucose, Bld: 98 mg/dL (ref 70–99)
Potassium: 4.5 mmol/L (ref 3.5–5.1)
Sodium: 137 mmol/L (ref 135–145)
Total Bilirubin: 0.7 mg/dL (ref 0.3–1.2)
Total Protein: 6.5 g/dL (ref 6.5–8.1)

## 2021-03-26 LAB — GLUCOSE, CAPILLARY: Glucose-Capillary: 73 mg/dL (ref 70–99)

## 2021-03-26 LAB — RESP PANEL BY RT-PCR (FLU A&B, COVID) ARPGX2
Influenza A by PCR: NEGATIVE
Influenza B by PCR: NEGATIVE
SARS Coronavirus 2 by RT PCR: POSITIVE — AB

## 2021-03-26 LAB — TROPONIN I (HIGH SENSITIVITY)
Troponin I (High Sensitivity): 713 ng/L (ref <18)
Troponin I (High Sensitivity): 1983 ng/L (ref <18)
Troponin I (High Sensitivity): 2306 ng/L (ref <18)
Troponin I (High Sensitivity): 2478 ng/L (ref <18)

## 2021-03-26 LAB — TSH: TSH: 0.69 u[IU]/mL (ref 0.350–4.500)

## 2021-03-26 LAB — MAGNESIUM: Magnesium: 1.8 mg/dL (ref 1.7–2.4)

## 2021-03-26 MED ORDER — LEVOTHYROXINE SODIUM 88 MCG PO TABS
88.0000 ug | ORAL_TABLET | ORAL | Status: DC
  Administered 2021-03-27 – 2021-03-28 (×2): 88 ug via ORAL
  Filled 2021-03-26 (×2): qty 1

## 2021-03-26 MED ORDER — SODIUM CHLORIDE 0.9% FLUSH
3.0000 mL | Freq: Two times a day (BID) | INTRAVENOUS | Status: DC
  Administered 2021-03-26 – 2021-03-28 (×4): 3 mL via INTRAVENOUS

## 2021-03-26 MED ORDER — POLYVINYL ALCOHOL 1.4 % OP SOLN
1.0000 [drp] | OPHTHALMIC | Status: DC | PRN
  Filled 2021-03-26: qty 15

## 2021-03-26 MED ORDER — HEPARIN (PORCINE) 25000 UT/250ML-% IV SOLN
700.0000 [IU]/h | INTRAVENOUS | Status: DC
  Administered 2021-03-26: 700 [IU]/h via INTRAVENOUS
  Filled 2021-03-26 (×2): qty 250

## 2021-03-26 MED ORDER — ENOXAPARIN SODIUM 30 MG/0.3ML IJ SOSY: 30.0000 mg | PREFILLED_SYRINGE | INTRAMUSCULAR | Status: DC

## 2021-03-26 MED ORDER — ALBUTEROL SULFATE HFA 108 (90 BASE) MCG/ACT IN AERS
2.0000 | INHALATION_SPRAY | Freq: Four times a day (QID) | RESPIRATORY_TRACT | Status: DC | PRN
  Filled 2021-03-26: qty 6.7

## 2021-03-26 MED ORDER — ALLOPURINOL 100 MG PO TABS
100.0000 mg | ORAL_TABLET | Freq: Every day | ORAL | Status: DC
  Administered 2021-03-27 – 2021-03-28 (×2): 100 mg via ORAL
  Filled 2021-03-26 (×2): qty 1

## 2021-03-26 MED ORDER — ASPIRIN EC 81 MG PO TBEC
81.0000 mg | DELAYED_RELEASE_TABLET | Freq: Every day | ORAL | Status: DC
  Administered 2021-03-27 – 2021-03-28 (×2): 81 mg via ORAL
  Filled 2021-03-26 (×2): qty 1

## 2021-03-26 MED ORDER — IRBESARTAN 300 MG PO TABS
300.0000 mg | ORAL_TABLET | Freq: Every day | ORAL | Status: DC
  Administered 2021-03-27 – 2021-03-28 (×2): 300 mg via ORAL
  Filled 2021-03-26 (×2): qty 1

## 2021-03-26 MED ORDER — HEPARIN BOLUS VIA INFUSION
3500.0000 [IU] | Freq: Once | INTRAVENOUS | Status: AC
  Administered 2021-03-26: 3500 [IU] via INTRAVENOUS
  Filled 2021-03-26: qty 3500

## 2021-03-26 MED ORDER — AMLODIPINE BESYLATE 5 MG PO TABS
2.5000 mg | ORAL_TABLET | Freq: Every day | ORAL | Status: DC
  Administered 2021-03-27 – 2021-03-28 (×2): 2.5 mg via ORAL
  Filled 2021-03-26 (×2): qty 1

## 2021-03-26 MED ORDER — METOPROLOL TARTRATE 25 MG PO TABS
25.0000 mg | ORAL_TABLET | Freq: Two times a day (BID) | ORAL | Status: DC
  Administered 2021-03-26 – 2021-03-28 (×3): 25 mg via ORAL
  Filled 2021-03-26 (×4): qty 1

## 2021-03-26 MED ORDER — POLYETHYLENE GLYCOL 3350 17 G PO PACK: 17.0000 g | PACK | Freq: Every day | ORAL | Status: DC | PRN

## 2021-03-26 MED ORDER — MOMETASONE FURO-FORMOTEROL FUM 200-5 MCG/ACT IN AERO
2.0000 | INHALATION_SPRAY | Freq: Two times a day (BID) | RESPIRATORY_TRACT | Status: DC
  Administered 2021-03-26 – 2021-03-28 (×2): 2 via RESPIRATORY_TRACT
  Filled 2021-03-26: qty 8.8

## 2021-03-26 MED ORDER — HYDROXYCHLOROQUINE SULFATE 200 MG PO TABS
200.0000 mg | ORAL_TABLET | Freq: Every day | ORAL | Status: DC
  Administered 2021-03-27 – 2021-03-28 (×2): 200 mg via ORAL
  Filled 2021-03-26 (×2): qty 1

## 2021-03-26 MED ORDER — ACETAMINOPHEN 325 MG PO TABS: 650.0000 mg | ORAL_TABLET | Freq: Four times a day (QID) | ORAL | Status: DC | PRN

## 2021-03-26 MED ORDER — ACETAMINOPHEN 650 MG RE SUPP: 650.0000 mg | Freq: Four times a day (QID) | RECTAL | Status: DC | PRN

## 2021-03-26 MED ORDER — ATORVASTATIN CALCIUM 40 MG PO TABS
80.0000 mg | ORAL_TABLET | Freq: Every day | ORAL | Status: DC
  Administered 2021-03-27 – 2021-03-28 (×2): 80 mg via ORAL
  Filled 2021-03-26 (×2): qty 2

## 2021-03-26 MED ORDER — ENSURE ENLIVE PO LIQD
237.0000 mL | Freq: Two times a day (BID) | ORAL | Status: DC
  Administered 2021-03-27 – 2021-03-28 (×3): 237 mL via ORAL

## 2021-03-26 NOTE — H&P (Addendum)
History and Physical   Hannah [name] FKC:[id number] DOB: [birth year] DOA: 03/26/2021  PCP: Glendale Chard, MD   [facility]  Chief Complaint: New A. fib  HPI: Hannah [name] is a 75 y.o. female with medical history significant of anemia, asthma, hypertension, CKD 3B, diabetes, MGUS, hypothyroidism, rheumatoid arthritis, lupus, diabetes, vitiligo, gout, hyperlipidemia who [facility] found to be tachycardic. [facility] to 1 week of cough and fatigue.  Also with chest pressure x1 day.  Has also had palpitations on and off for a year.  She presented there for evaluation of COVID status which did come back positive.  She has been vaccinated for times. While she was at the med clinic she had to be tachycardic and EMS was called.  Per report, on EMS arrival patient was in A. fib with RVR to 160 she then bradycardia down to the 30s and vomited and then promptly returned back to RVR in the 170s.  She converted spontaneously back to sinus rhythm for EMS. She denies fevers, chills, chest pain, shortness of breath, abdominal pain, constipation, diarrhea.  ED Course: Vital signs in the ED were stable however there was report of a softer blood pressure just after she converted to sinus rhythm per EMS.  Lab work-up showed CMP with creatinine stable at around 1.57 from a baseline of around 1.3.  CBC showed hemoglobin stable at 11.9 with platelets of 121.  Troponin initially elevated to 713 with repeat pending.  TSH normal.  Chest x-ray showed no acute abnormality.  Magnesium was normal.  Cardiology was consulted in ED and recommended admission and will see the patient.  Review of Systems: As per HPI otherwise all other systems reviewed and are negative.  Past Medical History:  Diagnosis Date   Anemia    Arthritis    Asthma    Diabetes mellitus without complication ([place])    Gout 12/17/2014   patient reported    Hyperlipidemia    Hypertension    Systemic lupus erythematosus ([place])     Past Surgical History:  Procedure Laterality Date   CATARACT EXTRACTION Bilateral 2015   DILATION AND CURETTAGE OF UTERUS     DOPPLER ECHOCARDIOGRAPHY  05/2018   Internist to review with pt; potential heart murmur 06/20/18   keratosis removal  2021   SKIN SURGERY  11/30/2018   left side of face   TOOTH EXTRACTION      Social History  reports that she has never smoked. She has never used smokeless tobacco. She reports previous alcohol use. She reports that she does not use drugs.  Allergies  Allergen Reactions   Shellfish Allergy Anaphylaxis and Hives   Shellfish-Derived Products Anaphylaxis    Family History  Problem Relation Age of Onset   Heart disease Father    Diabetes Father    Hypertension Mother    Hypertension Sister    Hypertension Sister    Leukemia Sister   Reviewed on admission  Prior to Admission medications   Medication Sig Start Date End Date Taking? Authorizing Provider  Accu-Chek Softclix Lancets lancets Use to check blood sugars daily E11.69 11/04/20   Glendale Chard, MD  albuterol (PROVENTIL HFA;VENTOLIN HFA) 108 (90 Base) MCG/ACT inhaler Inhale 2 puffs into the lungs every 6 (six) hours as needed for wheezing or shortness of breath. 09/02/18   Minette Brine, FNP  Alcohol Swabs (ALCOHOL PADS) 70 % PADS Use as directed to check blood  sugars 1 time per day dx: e11.22 06/28/20   Glendale Chard, MD  allopurinol (ZYLOPRIM) 100 MG tablet TAKE 1 TABLET BY MOUTH EVERY DAY FOR GOUT 10/08/20   Glendale Chard, MD  amLODipine (NORVASC) 2.5 MG tablet TAKE 1 TABLET(2.5 MG) BY MOUTH DAILY 02/13/21   Glendale Chard, MD  amLODipine (NORVASC) 5 MG tablet TAKE 1/2 TABLET BY MOUTH EVERY MORNING AND 1 TABLET EVERY EVENING 11/18/20   Glendale Chard, MD  Ascorbic Acid (VITAMIN C) 1000 MG tablet Take 1,000 mg by mouth daily.    [provider]  aspirin EC 81 MG tablet Take 81 mg by mouth daily.     [provider]  atorvastatin (LIPITOR) 80 MG tablet TAKE 1 TABLET BY MOUTH EVERY DAY 10/11/20   Glendale Chard, MD  Blood Glucose Calibration (TRUE METRIX LEVEL 1) Low SOLN Use as directed to check blood sugars 1 time per day dx: e11.22 Patient not taking: No sig reported 06/28/20   Glendale Chard, MD  budesonide-formoterol (SYMBICORT) 160-4.5 MCG/ACT inhaler INHALE 2 PUFFS BY MOUTH TWICE DAILY IN THE MORNING AND EVENING Patient taking differently: INHALE 2 PUFFS BY MOUTH TWICE DAILY IN THE MORNING AND EVENING AS NEEDED 09/25/19   Glendale Chard, MD  Calcium Carb-Cholecalciferol 600-800 MG-UNIT TABS Take 1 tablet by mouth daily.     [provider]  Cholecalciferol (VITAMIN D PO) Take 2,000 Units by mouth daily.     [provider]  cyanocobalamin 1000 MCG tablet Take 1,000 mcg by mouth daily.    [provider]  denosumab (PROLIA) 60 MG/ML SOSY injection INJECT 60 MG INTO THE SKIN EVERY 6 (SIX) MONTHS. 12/03/20 12/03/21  Bo Merino, MD  glucose blood (ACCU-CHEK GUIDE) test strip Use to check blood sugars daily E11.69 11/04/20   Glendale Chard, MD  hydroxychloroquine (PLAQUENIL) 200 MG tablet TAKE 1 TABLET BY MOUTH DAILY FOR RHEUMATOID ARTHRITIS 03/24/21   Bo Merino, MD  Hypromellose (ARTIFICIAL TEARS OP) Apply to eye as needed.    [provider]  irbesartan (AVAPRO) 300 MG tablet TAKE 1 TABLET(300 MG) BY MOUTH DAILY 10/21/20   Glendale Chard, MD  iron polysaccharides (NIFEREX) 150 MG capsule Take 150 mg by mouth daily.    [provider]  SYNTHROID 88 MCG tablet TAKE 1 TABLET BY MOUTH EVERY DAY MONDAY TO SATURDAY AND OFF ON SUNDAYS 12/27/19   Glendale Chard, MD  tacrolimus (PROTOPIC) 0.1 % ointment Apply topically daily. 02/06/20   Warren Danes, PA-C    Physical Exam: Vitals:   03/26/21 1930 03/26/21 1945 03/26/21 2015 03/26/21 2030  BP: (!) 143/71 120/71 136/76 139/74  Pulse: 74 68 73 68  Resp: (!) 23 15 15 16   Temp:       TempSrc:      SpO2: 97% 99% 98% 98%  Weight:      Height:       Physical Exam Constitutional:      General: She is not in acute distress.    Appearance: Normal appearance.  HENT:     Head: Normocephalic and atraumatic.     Mouth/Throat:     Mouth: Mucous membranes are moist.     Pharynx: Oropharynx is clear.  Eyes:     Extraocular Movements: Extraocular movements intact.     Pupils: Pupils are equal, round, and reactive to light.  Cardiovascular:     Rate and Rhythm: Normal rate and regular rhythm.     Pulses: Normal pulses.     Heart sounds: Normal heart sounds.  Pulmonary:     Effort: Pulmonary effort is normal. No respiratory distress.     Breath sounds: Normal breath sounds.  Abdominal:     General: Bowel sounds are normal. There is no distension.     Palpations: Abdomen is soft.     Tenderness: There is no abdominal tenderness.  Musculoskeletal:        General: No swelling or deformity.  Skin:    General: Skin is warm and dry.  Neurological:     General: No focal deficit present.     Mental Status: Mental status is at baseline.   Labs on Admission: I have personally reviewed following labs and imaging studies  CBC: Recent Labs  Lab 03/26/21 1436  WBC 6.2  NEUTROABS 4.7  HGB 11.9*  HCT 36.3  MCV 98.6  PLT 121*    Basic Metabolic Panel: Recent Labs  Lab 03/26/21 1436  NA 137  K 4.5  CL 102  CO2 24  GLUCOSE 98  BUN 23  CREATININE 1.57*  CALCIUM 9.3  MG 1.8    GFR: Estimated Creatinine Clearance: 25.8 mL/min (A) (by C-G formula based on SCr of 1.57 mg/dL (H)).  Liver Function Tests: Recent Labs  Lab 03/26/21 1436  AST 26  ALT 17  ALKPHOS 40  BILITOT 0.7  PROT 6.5  ALBUMIN 3.6    Urine analysis:    Component Value Date/Time   COLORURINE YELLOW 08/14/2020 1546   APPEARANCEUR CLEAR 08/14/2020 1546   LABSPEC 1.006 08/14/2020 1546   PHURINE 6.5 08/14/2020 1546   GLUCOSEU NEGATIVE 08/14/2020 1546   HGBUR NEGATIVE 08/14/2020 1546    BILIRUBINUR Negative 12/30/2020 1301   KETONESUR NEGATIVE 08/14/2020 1546   PROTEINUR Positive (A) 12/30/2020 1301   PROTEINUR NEGATIVE 08/14/2020 1546   UROBILINOGEN 0.2 12/30/2020 1301   NITRITE Negative 12/30/2020 1301   NITRITE NEGATIVE 08/14/2020 1546   LEUKOCYTESUR Trace (A) 12/30/2020 1301   LEUKOCYTESUR 1+ (A) 08/14/2020 1546    Radiological Exams on Admission: DG Chest Portable 1 View  Result Date: 03/26/2021 CLINICAL DATA:  Cough and shortness of breath for 2 days, COVID-19 positivity EXAM: PORTABLE CHEST 1 VIEW COMPARISON:  09/24/2016 FINDINGS: Cardiac shadow is stable. Aortic calcifications are noted. Lungs are well aerated bilaterally. No focal infiltrate or effusion is seen. No bony abnormality is noted. IMPRESSION: No acute abnormality noted. Electronically Signed   By: Inez Catalina M.D.   On: 03/26/2021 16:23    EKG: Independently reviewed.  Sinus rhythm at 73 bpm.  Possible left anterior fascicular block.  Similar to previous  Assessment/Plan Principal Problem:   Demand ischemia [facility]) Active Problems:   Asthma   Essential hypertension   Rheumatoid arthritis involving multiple sites with positive rheumatoid factor (HCC)   Iron deficiency anemia   Type 2 diabetes mellitus with stage 3 chronic kidney disease, without long-term current use of insulin (HCC)   Primary hypothyroidism   Unspecified atrial fibrillation ([place])   COVID-19 virus infection  Demand ischemia New onset atrial fibrillation > Patient found to have new onset tachycardia suspicion for new onset A. fib which spontaneously converted back to sinus rhythm in route by EMS. > Noted to have troponin elevated to 713 in the ED likely demand ischemia from this new onset tachycardia as above. Will trend troponin (could be ischemia triggering A-fib). - Appreciate cardiology recommendations - Monitor on progressive in case patient returns to RVR - Start metoprolol and heparin drip - Echocardiogram - Trend  Troponin  COVID-19 infection > Present to  minute clinic initially for COVID test after 7 days of symptoms found to be positive. - Not requiring any supplemental oxygen and is 7 days into her symptoms, we will hold off on treatment at this time.  Anemia > Hemoglobin stable at 11.9 - Trend CBC  Asthma - Replace home Symbicort with formulary Dulera  Hypertension - Continue home amlodipine and irbesartan  CKD 3B > Creatinine mildly elevated to 1.57 from baseline around 1.3. - Avoid nephrotoxic agents - Trend renal function and electrolytes  Diabetes - SSI  Hypothyroidism - Continue home synthroid  Rheumatoid arthritis Lupus - Continue home Plaquenil   Gout - Continue home allopurinol  Hyperlipidemia - Continue home atorvastatin  DVT prophylaxis: Lovenox  Code Status:              DNR, Intubation okay Family Communication:  Sister updated by phone.  Disposition Plan:   Patient is from:  Home  Anticipated DC to:  Home  Anticipated DC date:  1 to 2 days  Anticipated DC barriers: None  Consults called:  Cardiology consulted by EDP Admission status:  Observation, progressive   Severity of Illness: The appropriate patient status for this patient is OBSERVATION. Observation status is judged to be reasonable and necessary in order to provide the required intensity of service to ensure the patient's safety. The patient's presenting symptoms, physical exam findings, and initial radiographic and laboratory data in the context of their medical condition is felt to place them at decreased risk for further clinical deterioration. Furthermore, it is anticipated that the patient will be medically stable for discharge from the hospital within 2 midnights of admission. The following factors support the patient status of observation.   " The patient's presenting symptoms include tachycardia, cough. " The physical exam findings include stable physical exam. " The initial radiographic and  laboratory data are troponin 713 with repeat pending.  Hemoglobin 11.9, platelets 121, creatinine 1.57 up from baseline around 1.3.   Marcelyn Bruins MD [facility]  How to contact the [facility] Attending or Consulting provider [place] or covering provider during after hours [place], for this patient?   Check the care team in [facility] and look for a) attending/consulting [facility] provider listed and b) the [facility] team listed Log into [email or web address] and use [place]'s universal password to access. If you do not have the password, please contact the hospital operator. Locate the [facility] provider you are looking for under [facility] and page to a number that you can be directly reached. If you still have difficulty reaching the provider, please page the [facility] (Director on Call) for the Hospitalists listed on amion for assistance.  03/26/2021, 8:40 PM

## 2021-03-26 NOTE — ED Notes (Signed)
Charleston Ropes, MD regarding significant jump in troponin level.

## 2021-03-26 NOTE — ED Triage Notes (Signed)
[facility] Pt presented there today to get tested for COVID, tested positive today. Pt has had cough for a week, and chest pressure started last night. Pt has been vaccinated x4. On EMS arrival, pt found to be in afib RVR (hx afib), with a rate of 160. Pt then was bradycardic with a rate in the 30s and vomited, rate jumped back to 170, then pt self-converted to NSR, currently NSR on monitor. Pt hypotensive immediately after self-converting, last BP 126/80.

## 2021-03-26 NOTE — Progress Notes (Signed)
ANTICOAGULATION CONSULT NOTE - Follow Up Consult  Pharmacy Consult for Heparin Indication: chest pain/ACS  Allergies  Allergen Reactions   Shellfish Allergy Anaphylaxis and Hives   Shellfish-Derived Products Anaphylaxis    Patient Measurements: Height: 4\' 10"  (147.3 cm) Weight: 70.4 kg (155 lb 1.6 oz) IBW/kg (Calculated) : 40.9 Heparin Dosing Weight: 56.9kg  Vital Signs: Temp: 98.8 F (37.1 C) (06/29 1341) Temp Source: Oral (06/29 1341) BP: 139/74 (06/29 2030) Pulse Rate: 68 (06/29 2030)  Labs: Recent Labs    03/26/21 1436 03/26/21 1636  HGB 11.9*  --   HCT 36.3  --   PLT 121*  --   CREATININE 1.57*  --   TROPONINIHS 713* 1,983*    Estimated Creatinine Clearance: 25.8 mL/min (A) (by C-G formula based on SCr of 1.57 mg/dL (H)).   Medications:  (Not in a hospital admission)  Scheduled:   [START ON 03/27/2021] allopurinol  100 mg Oral Daily   [START ON 03/27/2021] amLODipine  2.5 mg Oral Daily   [START ON 03/27/2021] aspirin EC  81 mg Oral Daily   [START ON 03/27/2021] atorvastatin  80 mg Oral Daily   heparin  3,500 Units Intravenous Once   [START ON 03/27/2021] hydroxychloroquine  200 mg Oral Daily   [START ON 03/27/2021] irbesartan  300 mg Oral Daily   [START ON 03/27/2021] levothyroxine  88 mcg Oral Q0600   metoprolol tartrate  25 mg Oral BID   mometasone-formoterol  2 puff Inhalation BID   sodium chloride flush  3 mL Intravenous Q12H   Infusions:   heparin     Assessment: 75 year old female with a history of hypertension, hyperlipidemia, diabetes who presents with A. fib with RVR. Troponin elevated. Cardiology requesting ACS heparin dosing at this time.  Not on anticoagulation prior to arrival. Potential for transition to [place] (Eliquis) pending echo.  Goal of Therapy:  Heparin level 0.3-0.7 units/ml Monitor platelets by anticoagulation protocol: Yes   Plan:  Give 3500 units bolus x 1 Start heparin infusion at 700 units/hr Check anti-Xa level in 8 hours  and daily while on heparin Continue to monitor H&H and platelets  Heloise Purpura 03/26/2021,8:40 PM

## 2021-03-26 NOTE — ED Notes (Signed)
Pt resting comfortably, denies CP/SOB.

## 2021-03-26 NOTE — ED Provider Notes (Signed)
[facility] EMERGENCY DEPARTMENT Provider Note   CSN: [id number] Arrival date & time: 03/26/21  1337     History Chief Complaint  Patient presents with  . Covid Positive    Hannah [name] is a 75 y.o. female with past medical history of HTN, HLD, and DM who was sent to [facility] ED [facility].  [facility] was [facility] for cough x1 week, fully immunized for COVID-19.  She tested positive for COVID-19, but also was endorsing chest pressure which prompted them to call EMS.  On EMS arrival, patient was noted to be in atrial fibrillation with RVR, heart rate as high as 170.  She then became acutely bradycardic.  She ultimately self converted.  I reviewed patient's medical record and she had Holter Monitor placement 01/20/2021 for palpitations by Dr. Acie Fredrickson, cardiology.  On 02/28/2021 he commented that there was no significant arrhythmia noted during her Holter monitor placement.  On my examination, patient reports that for the past week she has been experiencing cold symptoms including a cough that wakes her up at night.  She [facility], then woke up this morning with "funny sensation" in her chest.  She usually takes a bath, but elected to do a sponge bath given her central chest pressure discomfort.  She denies any palpitations.  [facility].  She was positive for the antigen test.  However, her heart rate was elevated which prompted the provider to call EMS.    She denies any ongoing chest discomfort or "funny sensation" at present.  She complains predominantly of her cough symptoms that are persistent over the past week.  She has still been able to go to yoga and get regular exercise.  Patient is 98% on room air here in the ED.  No significant increased work of breathing.  She is uncertain as to where she could have been exposed to COVID-19.  She is fully  immunized and double boosted.  HPI     Past Medical History:  Diagnosis Date  . Anemia   . Arthritis   . Asthma   . Diabetes mellitus without complication ([place])   . Gout 12/17/2014   patient reported  . Hyperlipidemia   . Hypertension   . Systemic lupus erythematosus ([place])     Patient Active Problem List   Diagnosis Date Noted  . PVC (premature ventricular contraction) 05/06/2020  . Type 2 diabetes mellitus with stage 3 chronic kidney disease, without long-term current use of insulin ([place]) 03/20/2020  . Palpitations 03/20/2020  . 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/2020  . Primary hypothyroidism 03/20/2020  . Vitiligo 03/20/2020  . Iron deficiency anemia 08/12/2017  . Osteoporosis 10/02/2016  . Systemic lupus erythematosus ([place]) 10/02/2016  . Rheumatoid arthritis involving multiple sites with positive rheumatoid factor ([place]) 10/02/2016  . High risk medication use 10/02/2016  . History of chronic kidney disease 10/02/2016  . 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/2016  . Primary osteoarthritis of both knees 10/02/2016  . Vitamin D deficiency 10/02/2016  . History of diabetes mellitus 10/02/2016  . History of hypertension 10/02/2016  . History of asthma 10/02/2016  . Absolute anemia   . MGUS (monoclonal gammopathy of unknown significance)   . Normocytic anemia 03/21/2015  . Essential hypertension 03/20/2015  . Abnormal CT of the chest 07/30/2012  . Asthma 06/19/2012    Past Surgical History:  Procedure Laterality Date  . CATARACT EXTRACTION Bilateral 2015  . DILATION AND CURETTAGE OF UTERUS    . DOPPLER ECHOCARDIOGRAPHY  05/2018   Internist to review with pt; potential heart murmur 06/20/18  . keratosis removal  2021  . SKIN SURGERY  11/30/2018   left side of face  . TOOTH EXTRACTION       OB History     Gravida  1   Para  1   Term      Preterm      AB      Living  0      SAB      IAB      Ectopic      Multiple       Live Births           Obstetric Comments  Baby died from CHF         Family History  Problem Relation Age of Onset  . Heart disease Father   . Diabetes Father   . Hypertension Mother   . Hypertension Sister   . Hypertension Sister   . Leukemia Sister     Social History   Tobacco Use  . Smoking status: Never  . Smokeless tobacco: Never  Vaping Use  . Vaping Use: Never used  Substance Use Topics  . Alcohol use: Not Currently    Comment: rarely   . Drug use: No    Home Medications Prior to Admission medications   Medication Sig Start Date End Date Taking? Authorizing Provider  Accu-Chek Softclix Lancets lancets Use to check blood sugars daily E11.69 11/04/20   Glendale Chard, MD  albuterol (PROVENTIL HFA;VENTOLIN HFA) 108 (90 Base) MCG/ACT inhaler Inhale 2 puffs into the lungs every 6 (six) hours as needed for wheezing or shortness of breath. 09/02/18   Minette Brine, FNP  Alcohol Swabs (ALCOHOL PADS) 70 % PADS Use as directed to check blood sugars 1 time per day dx: e11.22 06/28/20   Glendale Chard, MD  allopurinol (ZYLOPRIM) 100 MG tablet TAKE 1 TABLET BY MOUTH EVERY DAY FOR GOUT 10/08/20   Glendale Chard, MD  amLODipine (NORVASC) 2.5 MG tablet TAKE 1 TABLET(2.5 MG) BY MOUTH DAILY 02/13/21   Glendale Chard, MD  amLODipine (NORVASC) 5 MG tablet TAKE 1/2 TABLET BY MOUTH EVERY MORNING AND 1 TABLET EVERY EVENING 11/18/20   Glendale Chard, MD  Ascorbic Acid (VITAMIN C) 1000 MG tablet Take 1,000 mg by mouth daily.    [provider]  aspirin EC 81 MG tablet Take 81 mg by mouth daily.    [provider]  atorvastatin (LIPITOR) 80 MG tablet TAKE 1 TABLET BY MOUTH EVERY DAY 10/11/20   Glendale Chard, MD  Blood Glucose Calibration (TRUE METRIX LEVEL 1) Low SOLN Use as directed to check blood sugars 1 time per day dx: e11.22 Patient not taking: No sig reported 06/28/20   Glendale Chard, MD  budesonide-formoterol (SYMBICORT) 160-4.5 MCG/ACT inhaler INHALE 2 PUFFS BY MOUTH  TWICE DAILY IN THE MORNING AND EVENING Patient taking differently: INHALE 2 PUFFS BY MOUTH TWICE DAILY IN THE MORNING AND EVENING AS NEEDED 09/25/19   Glendale Chard, MD  Calcium Carb-Cholecalciferol 600-800 MG-UNIT TABS Take 1 tablet by mouth daily.     [provider]  Cholecalciferol (VITAMIN D PO) Take 2,000 Units by mouth daily.     [provider]  cyanocobalamin 1000 MCG tablet Take 1,000 mcg by mouth daily.    [provider]  denosumab (PROLIA) 60  MG/ML SOSY injection INJECT 60 MG INTO THE SKIN EVERY 6 (SIX) MONTHS. 12/03/20 12/03/21  Bo Merino, MD  glucose blood (ACCU-CHEK GUIDE) test strip Use to check blood sugars daily E11.69 11/04/20   Glendale Chard, MD  hydroxychloroquine (PLAQUENIL) 200 MG tablet TAKE 1 TABLET BY MOUTH DAILY FOR RHEUMATOID ARTHRITIS 03/24/21   Bo Merino, MD  Hypromellose (ARTIFICIAL TEARS OP) Apply to eye as needed.    [provider]  irbesartan (AVAPRO) 300 MG tablet TAKE 1 TABLET(300 MG) BY MOUTH DAILY 10/21/20   Glendale Chard, MD  iron polysaccharides (NIFEREX) 150 MG capsule Take 150 mg by mouth daily.    [provider]  SYNTHROID 88 MCG tablet TAKE 1 TABLET BY MOUTH EVERY DAY MONDAY TO SATURDAY AND OFF ON SUNDAYS 12/27/19   Glendale Chard, MD  tacrolimus (PROTOPIC) 0.1 % ointment Apply topically daily. 02/06/20   Warren Danes, PA-C    Allergies    Shellfish allergy  Review of Systems   Review of Systems  All other systems reviewed and are negative.  Physical Exam Updated Vital Signs BP 119/66   Pulse 69   Temp 98.8 F (37.1 C) (Oral)   Resp (!) 22   Ht 4\' 10"  (1.473 m)   Wt 70.4 kg   SpO2 96%   BMI 32.42 kg/m   Physical Exam Vitals and nursing note reviewed. Exam conducted with a chaperone present.  Constitutional:      General: She is not in acute distress.    Appearance: She is ill-appearing. She is not toxic-appearing.  HENT:     Head: Normocephalic and atraumatic.  Eyes:      General: No scleral icterus.    Conjunctiva/sclera: Conjunctivae normal.  Cardiovascular:     Rate and Rhythm: Normal rate and regular rhythm.     Pulses: Normal pulses.     Heart sounds: Normal heart sounds.  Pulmonary:     Effort: Pulmonary effort is normal. No respiratory distress.     Breath sounds: Normal breath sounds.  Abdominal:     General: Abdomen is flat. There is no distension.     Palpations: Abdomen is soft.     Tenderness: There is no abdominal tenderness.  Musculoskeletal:     Cervical back: Normal range of motion. No rigidity.     Right lower leg: No edema.     Left lower leg: No edema.  Skin:    General: Skin is dry.  Neurological:     Mental Status: She is alert and oriented to person, place, and time.     GCS: GCS eye subscore is 4. GCS verbal subscore is 5. GCS motor subscore is 6.  Psychiatric:        Mood and Affect: Mood normal.        Behavior: Behavior normal.        Thought Content: Thought content normal.    ED Results / Procedures / Treatments   Labs (all labs ordered are listed, but only abnormal results are displayed) Labs Reviewed  CBC WITH DIFFERENTIAL/PLATELET - Abnormal; Notable for the following components:      Result Value   RBC 3.68 (*)    Hemoglobin 11.9 (*)    Platelets 121 (*)    Lymphs Abs 0.6 (*)    All other components within normal limits  COMPREHENSIVE METABOLIC PANEL - Abnormal; Notable for the following components:   Creatinine, Ser 1.57 (*)    GFR, Estimated 34 (*)    All other components  within normal limits  TROPONIN I (HIGH SENSITIVITY) - Abnormal; Notable for the following components:   Troponin I (High Sensitivity) 713 (*)    All other components within normal limits  TSH  MAGNESIUM  TROPONIN I (HIGH SENSITIVITY)    EKG EKG Interpretation  Date/Time:  Wednesday March 26 2021 13:47:27 EDT Ventricular Rate:  73 PR Interval:  192 QRS Duration: 83 QT Interval:  401 QTC Calculation: 442 R Axis:   -43 Text  Interpretation: Sinus rhythm Left anterior fascicular block Anteroseptal infarct, old similar to 2016 Confirmed by Sherwood Gambler [phone]) on 03/26/2021 3:37:04 PM  Radiology DG Chest Portable 1 View  Result Date: 03/26/2021 CLINICAL DATA:  Cough and shortness of breath for 2 days, COVID-19 positivity EXAM: PORTABLE CHEST 1 VIEW COMPARISON:  09/24/2016 FINDINGS: Cardiac shadow is stable. Aortic calcifications are noted. Lungs are well aerated bilaterally. No focal infiltrate or effusion is seen. No bony abnormality is noted. IMPRESSION: No acute abnormality noted. Electronically Signed   By: Inez Catalina M.D.   On: 03/26/2021 16:23    Procedures .Critical Care  Date/Time: 03/26/2021 5:53 PM Performed by: Corena Herter, PA-C Authorized by: Corena Herter, PA-C   Critical care provider statement:    Critical care time (minutes):  45   Critical care was necessary to treat or prevent imminent or life-threatening deterioration of the following conditions:  Cardiac failure   Critical care was time spent personally by me on the following activities:  Discussions with consultants, evaluation of patient's response to treatment, examination of patient, ordering and performing treatments and interventions, ordering and review of laboratory studies, ordering and review of radiographic studies, pulse oximetry, re-evaluation of patient's condition, obtaining history from patient or surrogate and review of old charts Comments:     Elevated troponin   Medications Ordered in ED Medications - No data to display  ED Course  I have reviewed the triage vital signs and the nursing notes.  Pertinent labs & imaging results that were available during my care of the patient were reviewed by me and considered in my medical decision making (see chart for details).      Clinical Course as of 03/26/21 1839  Wed Mar 26, 2021  1750 I spoke with Dr. Burt Knack, cardiology, who recommends that we admit to  hospitalist for observation and cycled troponin.  She will likely require echocardiogram while admitted.   [GG]    Clinical Course User Index [GG] Corena Herter, PA-C   MDM Rules/Calculators/A&P                          SHARALYN LOMBA was evaluated in Emergency Department on 03/26/2021 for the symptoms described in the history of present illness. She was evaluated in the context of the global COVID-19 pandemic, which necessitated consideration that the patient might be at risk for infection with the SARS-CoV-2 virus that causes COVID-19. Institutional protocols and algorithms that pertain to the evaluation of patients at risk for COVID-19 are in a state of rapid change based on information released by regulatory bodies including the CDC and federal and state organizations. These policies and algorithms were followed during the patient's care in the ED.  I personally reviewed patient's medical chart and all notes from triage and staff during today's encounter. I have also ordered and reviewed all labs and imaging that I felt to be medically necessary in the evaluation of this patient's complaints and with consideration of their  physical exam. If needed, translation services were available and utilized.   Patient is day 7 into course of illness.  Unfortunately given chronicity of illness she is no longer candidate for Paxlovid despite being high risk for poor outcomes in setting of new COVID-19 diagnosis.  Fortunately she is quadruple immunized and is not demonstrating any increased work of breathing or oxygen requirements here in the ER.  Most concerning is for new onset atrial fibrillation with RVR that occurred earlier today and resolved spontaneously prior to arrival.  She had a Holter monitor on recently, but there was no arrhythmia noted per her cardiologist.    Chest x-ray is without any acute cardiopulmonary disease.  EKG was sinus rhythm, no significant changes when compared to prior  tracings.  Patient has troponin elevated at 713, no other significant derangement.  She again tells me that she had chest pain shortly after waking around 8:00 this morning and then chest pain again while at the urgent care that lasted briefly before resolving.  Given elevated troponins, will consult cardiology.  No current chest pain.    I spoke with Dr. Burt Knack, cardiology, who recommends that we admit to hospitalist for observation and cycled troponin.  She will likely require echocardiogram while admitted.     Final Clinical Impression(s) / ED Diagnoses Final diagnoses:  Tachyarrhythmia  Elevated troponin  COVID-19    Rx / DC Orders ED Discharge Orders     None        Corena Herter, PA-C 03/26/21 1839    Sherwood Gambler, MD 03/29/21 1524

## 2021-03-26 NOTE — Consult Note (Addendum)
Cardiology Consultation:   Patient ID: Hannah [name] MRN: [id number]; DOB: [birth year]  Admit date: 03/26/2021 Date of Consult: 03/26/2021  PCP:  Hannah Chard, MD   [facility] Providers Cardiologist:  Hannah Moores, MD        Patient Profile:   Hannah [name] is a 75 y.o. female with a hx of hypertension,diabetes, hyperlipidemia who is being seen 03/26/2021 for the evaluation of Afib at the request of Dr. Trilby [name].  History of Present Illness:   Hannah [name] is a 75 year old female with a history of hypertension, hyperlipidemia, diabetes who presents with A. fib with RVR.  She has been fully vaccinated for COVID-19.  She reports cough x1 week, [facility].  She tested positive for COVID-19.  She was noted to have elevated heart rate, so EMS was called.  When EMS arrived she was found to be in tachyarrhythmia, suspected A. fib with RVR, rates reportedly as high as 170s.  She converted to sinus rhythm prior to arrival in the ED.  In the ED, initial vital signs notable for BP 121/78, pulse 74, SPO2 97% on room air.  Labs notable for creatinine 1.57 (increased from 1.37 in March), potassium 4.5, magnesium 1.8, WBC 6.2, hemoglobin 11.9, platelets 121, TSH 0.69, troponin 713.  She denies having any chest pain, but does report she felt palpitations.  She has seen Hannah [name] for palpitations, last 04/2020.  She reported worsening palpitations in April and 30-day monitor was done which showed no significant abnormalities.  Most recent echocardiogram 04/2018 showed normal biventricular function, grade 1 diastolic dysfunction, no significant valvular disease.     Past Medical History:  Diagnosis Date   Anemia    Arthritis    Asthma    Diabetes mellitus without complication ([place])    Gout 12/17/2014   patient reported   Hyperlipidemia    Hypertension    Systemic lupus erythematosus ([place])     Past Surgical History:  Procedure Laterality Date    CATARACT EXTRACTION Bilateral 2015   DILATION AND CURETTAGE OF UTERUS     DOPPLER ECHOCARDIOGRAPHY  05/2018   Internist to review with pt; potential heart murmur 06/20/18   keratosis removal  2021   SKIN SURGERY  11/30/2018   left side of face   TOOTH EXTRACTION         Inpatient Medications: Scheduled Meds:  Continuous Infusions:  PRN Meds:   Allergies:    Allergies  Allergen Reactions   Shellfish Allergy Anaphylaxis and Hives    Social History:   Social History   Socioeconomic History   Marital status: Single    Spouse name: Not on file   Number of children: 0   Years of education: Not on file   Highest education level: Not on file  Occupational History   Occupation: retired  Tobacco Use   Smoking status: Never   Smokeless tobacco: Never  Vaping Use   Vaping Use: Never used  Substance and Sexual Activity   Alcohol use: Not Currently    Comment: rarely    Drug use: No   Sexual activity: Not Currently    Comment: 1ST intercourse- 61 , partners- 51,   Other Topics Concern   Not on file  Social History Narrative   Not on file   Social Determinants of Health   Financial Resource Strain: Low Risk    Difficulty of Paying Living Expenses: Not hard at all  Food Insecurity: No Food Insecurity  Worried About Charity fundraiser in the Last Year: Never true   [place] in the Last Year: Never true  Transportation Needs: No Transportation Needs   Lack of Transportation (Medical): No   Lack of Transportation (Non-Medical): No  Physical Activity: Sufficiently Active   Days of Exercise per Week: 4 days   Minutes of Exercise per Session: 40 min  Stress: Stress Concern Present   Feeling of Stress : To some extent  Social Connections: Not on file  Intimate Partner Violence: Not on file    Family History:    Family History  Problem Relation Age of Onset   Heart disease Father    Diabetes Father    Hypertension Mother    Hypertension Sister     Hypertension Sister    Leukemia Sister      ROS:  Please see the history of present illness.   All other ROS reviewed and negative.     Physical Exam/Data:   Vitals:   03/26/21 1730 03/26/21 1745 03/26/21 1800 03/26/21 1815  BP: 125/64 127/68 124/61 119/66  Pulse: 67 69 80 69  Resp: 19 20 (!) 21 (!) 22  Temp:      TempSrc:      SpO2: 99% 97% 96% 96%  Weight:      Height:       No intake or output data in the 24 hours ending 03/26/21 1834 Last 3 Weights 03/26/2021 01/27/2021 01/08/2021  Weight (lbs) 155 lb 1.6 oz 157 lb 9.6 oz 159 lb  Weight (kg) 70.353 kg 71.487 kg 72.122 kg     Body mass index is 32.42 kg/m.  General:  in no acute distress HEENT: normal Neck: no JVD Cardiac:  normal S1, S2; RRR; no murmur  Lungs:  clear to auscultation bilaterally, no wheezing, rhonchi or rales  Abd: soft, nontender Ext: no edema Musculoskeletal:  No deformities, BUE and BLE strength normal and equal Skin: warm and dry  Neuro:  no focal abnormalities noted Psych:  Normal affect   EKG:  The EKG was personally reviewed and demonstrates: normal sinus rhythm rate 73, poor R wave progression :  Telemetry:  Telemetry was personally reviewed and demonstrates:  NSR, 70s  Relevant CV Studies:   Laboratory Data:  High Sensitivity Troponin:   Recent Labs  Lab 03/26/21 1436  TROPONINIHS 713*     Chemistry Recent Labs  Lab 03/26/21 1436  NA 137  K 4.5  CL 102  CO2 24  GLUCOSE 98  BUN 23  CREATININE 1.57*  CALCIUM 9.3  GFRNONAA 34*  ANIONGAP 11    Recent Labs  Lab 03/26/21 1436  PROT 6.5  ALBUMIN 3.6  AST 26  ALT 17  ALKPHOS 40  BILITOT 0.7   Hematology Recent Labs  Lab 03/26/21 1436  WBC 6.2  RBC 3.68*  HGB 11.9*  HCT 36.3  MCV 98.6  MCH 32.3  MCHC 32.8  RDW 14.1  PLT 121*   BNPNo results for input(s): BNP, PROBNP in the last 168 hours.  DDimer No results for input(s): DDIMER in the last 168 hours.   Radiology/Studies:  DG Chest Portable 1  View  Result Date: 03/26/2021 CLINICAL DATA:  Cough and shortness of breath for 2 days, COVID-19 positivity EXAM: PORTABLE CHEST 1 VIEW COMPARISON:  09/24/2016 FINDINGS: Cardiac shadow is stable. Aortic calcifications are noted. Lungs are well aerated bilaterally. No focal infiltrate or effusion is seen. No bony abnormality is noted. IMPRESSION: No acute abnormality noted.  Electronically Signed   By: Inez Catalina M.D.   On: 03/26/2021 16:23     Assessment and Plan:   Tachyarrhythmia: EKG from EMS shows regular narrow complex tachycardia at rate 150.  Differential includes SVT versus atrial flutter.  No clear flutter waves, but given rate 150 suspicious for 2-1 atrial flutter.  CHA2DS2-VASc score 5 (hypertension, age x2, diabetes, female) -Recommend starting metoprolol 25 mg twice daily -Recommend starting anticoagulation, would start heparin gtt for now.  If troponin not significantly elevated and no WMA on echo and not planning for cath can transition to Eliquis -Echocardiogram  Troponin elevation: Initial troponin 713.  Suspect demand ischemia in setting of tachyarrhythmia as above. -Trend troponins -Echocardiogram to evaluate for new wall motion abnormality -If significant troponin elevation or echo with new wall motion abnormality, will need further ischemia evaluation  COVID-19 infection: treatment per primary team   For questions or updates, please contact [facility] Please consult [email or web address] for contact info under    Signed, Donato Heinz, MD  03/26/2021 6:34 PM

## 2021-03-26 NOTE — Telephone Encounter (Signed)
  Care Management   Follow Up Note   03/26/2021 Name: Hannah [name] MRN: [id number] DOB: December 30, 1945   Referred by: Glendale Chard, MD Reason for referral : Chronic Care Management (Inbound call from patient )  Voice message received from patient requesting a return call regarding an appointment that is needed for a COVID test. Patient states she is having some mild respiratory symptoms and would like to be tested prior to going out of town.  An unsuccessful telephone outreach was attempted today. The patient was referred to the case management team for assistance with care management and care coordination.   Follow Up Plan: A HIPPA compliant phone message was left for the patient providing contact information and requesting a return call.   Barb Merino, RN, BSN, CCM Care Management Coordinator [facility]  Direct Phone: [phone]

## 2021-03-27 ENCOUNTER — Observation Stay (HOSPITAL_COMMUNITY): Payer: Medicare HMO

## 2021-03-27 ENCOUNTER — Observation Stay (HOSPITAL_BASED_OUTPATIENT_CLINIC_OR_DEPARTMENT_OTHER): Payer: Medicare HMO

## 2021-03-27 DIAGNOSIS — I4891 Unspecified atrial fibrillation: Secondary | ICD-10-CM

## 2021-03-27 DIAGNOSIS — I4892 Unspecified atrial flutter: Secondary | ICD-10-CM | POA: Diagnosis not present

## 2021-03-27 DIAGNOSIS — U071 COVID-19: Secondary | ICD-10-CM

## 2021-03-27 DIAGNOSIS — M0579 Rheumatoid arthritis with rheumatoid factor of multiple sites without organ or systems involvement: Secondary | ICD-10-CM

## 2021-03-27 DIAGNOSIS — I248 Other forms of acute ischemic heart disease: Secondary | ICD-10-CM | POA: Diagnosis not present

## 2021-03-27 DIAGNOSIS — J4520 Mild intermittent asthma, uncomplicated: Secondary | ICD-10-CM | POA: Diagnosis not present

## 2021-03-27 DIAGNOSIS — E1122 Type 2 diabetes mellitus with diabetic chronic kidney disease: Secondary | ICD-10-CM

## 2021-03-27 DIAGNOSIS — N1831 Chronic kidney disease, stage 3a: Secondary | ICD-10-CM | POA: Diagnosis not present

## 2021-03-27 DIAGNOSIS — R778 Other specified abnormalities of plasma proteins: Secondary | ICD-10-CM | POA: Diagnosis not present

## 2021-03-27 DIAGNOSIS — I10 Essential (primary) hypertension: Secondary | ICD-10-CM

## 2021-03-27 LAB — COMPREHENSIVE METABOLIC PANEL
ALT: 18 U/L (ref 0–44)
AST: 31 U/L (ref 15–41)
Albumin: 3 g/dL — ABNORMAL LOW (ref 3.5–5.0)
Alkaline Phosphatase: 36 U/L — ABNORMAL LOW (ref 38–126)
Anion gap: 9 (ref 5–15)
BUN: 26 mg/dL — ABNORMAL HIGH (ref 8–23)
CO2: 25 mmol/L (ref 22–32)
Calcium: 8.5 mg/dL — ABNORMAL LOW (ref 8.9–10.3)
Chloride: 102 mmol/L (ref 98–111)
Creatinine, Ser: 1.34 mg/dL — ABNORMAL HIGH (ref 0.44–1.00)
GFR, Estimated: 41 mL/min — ABNORMAL LOW (ref >60)
Glucose, Bld: 84 mg/dL (ref 70–99)
Potassium: 4.2 mmol/L (ref 3.5–5.1)
Sodium: 136 mmol/L (ref 135–145)
Total Bilirubin: 0.4 mg/dL (ref 0.3–1.2)
Total Protein: 5.7 g/dL — ABNORMAL LOW (ref 6.5–8.1)

## 2021-03-27 LAB — CBC
HCT: 31.2 % — ABNORMAL LOW (ref 36.0–46.0)
Hemoglobin: 10.2 g/dL — ABNORMAL LOW (ref 12.0–15.0)
MCH: 31.6 pg (ref 26.0–34.0)
MCHC: 32.7 g/dL (ref 30.0–36.0)
MCV: 96.6 fL (ref 80.0–100.0)
Platelets: 89 10*3/uL — ABNORMAL LOW (ref 150–400)
RBC: 3.23 MIL/uL — ABNORMAL LOW (ref 3.87–5.11)
RDW: 13.8 % (ref 11.5–15.5)
WBC: 3.7 10*3/uL — ABNORMAL LOW (ref 4.0–10.5)
nRBC: 0 % (ref 0.0–0.2)

## 2021-03-27 LAB — GLUCOSE, CAPILLARY
Glucose-Capillary: 95 mg/dL (ref 70–99)
Glucose-Capillary: 120 mg/dL — ABNORMAL HIGH (ref 70–99)
Glucose-Capillary: 95 mg/dL (ref 70–99)

## 2021-03-27 LAB — ECHOCARDIOGRAM COMPLETE
Area-P 1/2: 2.26 cm2
Height: 58 in
S' Lateral: 2.8 cm
Weight: 2481.6 oz

## 2021-03-27 LAB — HEPARIN LEVEL (UNFRACTIONATED): Heparin Unfractionated: 0.53 IU/mL (ref 0.30–0.70)

## 2021-03-27 MED ORDER — PROCHLORPERAZINE EDISYLATE 10 MG/2ML IJ SOLN
5.0000 mg | INTRAMUSCULAR | Status: AC
  Filled 2021-03-27: qty 1

## 2021-03-27 NOTE — Progress Notes (Signed)
Progress Note  Patient Name: Hannah [name] Date of Encounter: 03/27/2021  [facility] Cardiologist: Mertie Moores, MD   Subjective   Denies any chest pain or dyspnea  Inpatient Medications    Scheduled Meds:  allopurinol  100 mg Oral Daily   amLODipine  2.5 mg Oral Daily   aspirin EC  81 mg Oral Daily   atorvastatin  80 mg Oral Daily   feeding supplement  237 mL Oral BID BM   hydroxychloroquine  200 mg Oral Daily   irbesartan  300 mg Oral Daily   levothyroxine  88 mcg Oral Once per day on Mon Tue Wed Thu Fri Sat   metoprolol tartrate  25 mg Oral BID   mometasone-formoterol  2 puff Inhalation BID   prochlorperazine  5 mg Intravenous STAT   sodium chloride flush  3 mL Intravenous Q12H   Continuous Infusions:  heparin 700 Units/hr (03/26/21 2211)   PRN Meds: acetaminophen **OR** acetaminophen, albuterol, polyethylene glycol, polyvinyl alcohol   Vital Signs    Vitals:   03/27/21 0102 03/27/21 0312 03/27/21 0331 03/27/21 0813  BP: 131/70  132/62 125/76  Pulse: (!) 51  (!) 55 (!) 55  Resp: 20  20 20   Temp: 99 F (37.2 C)  99.2 F (37.3 C) 98.1 F (36.7 C)  TempSrc: Oral  Oral Oral  SpO2: 94%  98% 97%  Weight:  70.4 kg    Height:        Intake/Output Summary (Last 24 hours) at 03/27/2021 0901 Last data filed at 03/27/2021 0400 Gross per 24 hour  Intake 297.43 ml  Output 900 ml  Net -602.57 ml   Last 3 Weights 03/27/2021 03/26/2021 03/26/2021  Weight (lbs) 155 lb 1.6 oz 155 lb 3.2 oz 155 lb 1.6 oz  Weight (kg) 70.353 kg 70.398 kg 70.353 kg      Telemetry    NSR, 50-60s - Personally Reviewed  ECG    No new ECG - Personally Reviewed  Physical Exam   GEN: No acute distress.   Neck: No JVD Cardiac: RRR, no murmurs, rubs, or gallops.  Respiratory: Clear to auscultation bilaterally. GI: Soft, nontender, non-distended  MS: No edema; No deformity. Neuro:  Nonfocal  Psych: Normal affect   Labs    High Sensitivity Troponin:   Recent Labs  Lab  03/26/21 1436 03/26/21 1636 03/26/21 2003 03/26/21 2210  TROPONINIHS 713* 1,983* 2,306* 2,478*      Chemistry Recent Labs  Lab 03/26/21 1436 03/27/21 0519  NA 137 136  K 4.5 4.2  CL 102 102  CO2 24 25  GLUCOSE 98 84  BUN 23 26*  CREATININE 1.57* 1.34*  CALCIUM 9.3 8.5*  PROT 6.5 5.7*  ALBUMIN 3.6 3.0*  AST 26 31  ALT 17 18  ALKPHOS 40 36*  BILITOT 0.7 0.4  GFRNONAA 34* 41*  ANIONGAP 11 9     Hematology Recent Labs  Lab 03/26/21 1436 03/27/21 0519  WBC 6.2 3.7*  RBC 3.68* 3.23*  HGB 11.9* 10.2*  HCT 36.3 31.2*  MCV 98.6 96.6  MCH 32.3 31.6  MCHC 32.8 32.7  RDW 14.1 13.8  PLT 121* 89*    BNPNo results for input(s): BNP, PROBNP in the last 168 hours.   DDimer No results for input(s): DDIMER in the last 168 hours.   Radiology    DG Chest Portable 1 View  Result Date: 03/26/2021 CLINICAL DATA:  Cough and shortness of breath for 2 days, COVID-19 positivity EXAM: PORTABLE CHEST 1  VIEW COMPARISON:  09/24/2016 FINDINGS: Cardiac shadow is stable. Aortic calcifications are noted. Lungs are well aerated bilaterally. No focal infiltrate or effusion is seen. No bony abnormality is noted. IMPRESSION: No acute abnormality noted. Electronically Signed   By: Inez Catalina M.D.   On: 03/26/2021 16:23    Cardiac Studies     Patient Profile     75 y.o. female with a history of hypertension, hyperlipidemia, diabetes who presents with Atrial flutter and troponin elevation  Assessment & Plan   Tachyarrhythmia: EKG from EMS shows regular narrow complex tachycardia at rate 150.  Differential includes SVT versus atrial flutter.  No clear flutter waves, but given rate 150 suspicious for 2-1 atrial flutter.  CHA2DS2-VASc score 5 (hypertension, age x2, diabetes, female) -Continue metoprolol 25 mg twice daily -Continue heparin gtt for now  -Echocardiogram   Troponin elevation: Initial troponin 713>1983>2306>2478.  Denies any chest pain, only reported palpitations.  Given lack  of chest pain, suspect demand ischemia in setting of tachyarrhythmia as above -Echocardiogram to evaluate for new wall motion abnormality.  If no WMA and patient continues without anginal symptoms, would favor outpatient f/u for ischemia evaluation after she recovers from [place].  If WMA on echo or having chest pain, would favor inpatient ischemia evaluation  COVID-19 infection: treatment per primary team  For questions or updates, please contact [place] Please consult [email or web address] for contact info under        Signed, Donato Heinz, MD  03/27/2021, 9:01 AM

## 2021-03-27 NOTE — Progress Notes (Signed)
  Echocardiogram 2D Echocardiogram has been performed.  Hannah [name] 03/27/2021, 6:02 PM

## 2021-03-27 NOTE — Progress Notes (Signed)
ANTICOAGULATION CONSULT NOTE - Follow Up Consult  Pharmacy Consult for Heparin Indication: chest pain/ACS  Allergies  Allergen Reactions   Shellfish Allergy Anaphylaxis and Hives   Shellfish-Derived Products Anaphylaxis    Patient Measurements: Height: 4\' 10"  (147.3 cm) Weight: 70.4 kg (155 lb 1.6 oz) IBW/kg (Calculated) : 40.9 Heparin Dosing Weight: 56.9kg  Vital Signs: Temp: 99.2 F (37.3 C) (06/30 0331) Temp Source: Oral (06/30 0331) BP: 132/62 (06/30 0331) Pulse Rate: 55 (06/30 0331)  Labs: Recent Labs    03/26/21 1436 03/26/21 1636 03/26/21 2003 03/26/21 2210 03/27/21 0519  HGB 11.9*  --   --   --  10.2*  HCT 36.3  --   --   --  31.2*  PLT 121*  --   --   --  89*  HEPARINUNFRC  --   --   --   --  0.53  CREATININE 1.57*  --   --   --  1.34*  TROPONINIHS 713* 1,983* 2,306* 2,478*  --      Estimated Creatinine Clearance: 30.2 mL/min (A) (by C-G formula based on SCr of 1.34 mg/dL (H)).   Medications:  Medications Prior to Admission  Medication Sig Dispense Refill Last Dose   albuterol (PROVENTIL HFA;VENTOLIN HFA) 108 (90 Base) MCG/ACT inhaler Inhale 2 puffs into the lungs every 6 (six) hours as needed for wheezing or shortness of breath. 1 Inhaler 2 unknown   allopurinol (ZYLOPRIM) 100 MG tablet TAKE 1 TABLET BY MOUTH EVERY DAY FOR GOUT 90 tablet 1 03/26/2021   amLODipine (NORVASC) 2.5 MG tablet TAKE 1 TABLET(2.5 MG) BY MOUTH DAILY 30 tablet 11 03/26/2021   amLODipine (NORVASC) 5 MG tablet TAKE 1/2 TABLET BY MOUTH EVERY MORNING AND 1 TABLET EVERY EVENING (Patient taking differently: Take 5 mg by mouth every evening.) 135 tablet 1 03/25/2021   Ascorbic Acid (VITAMIN C) 1000 MG tablet Take 1,000 mg by mouth daily.   03/25/2021   aspirin EC 81 MG tablet Take 81 mg by mouth daily.   03/26/2021   atorvastatin (LIPITOR) 80 MG tablet TAKE 1 TABLET BY MOUTH EVERY DAY 90 tablet 1 03/26/2021   budesonide-formoterol (SYMBICORT) 160-4.5 MCG/ACT inhaler INHALE 2 PUFFS BY MOUTH  TWICE DAILY IN THE MORNING AND EVENING (Patient taking differently: Inhale 2 puffs into the lungs 2 (two) times daily as needed (sob/wheezing).) 10.2 g 2 unknown   Calcium Carb-Cholecalciferol 600-800 MG-UNIT TABS Take 1 tablet by mouth daily.    03/25/2021   Cholecalciferol (VITAMIN D PO) Take 2,000 Units by mouth daily.    03/25/2021   cyanocobalamin 1000 MCG tablet Take 1,000 mcg by mouth daily.   03/25/2021   denosumab (PROLIA) 60 MG/ML SOSY injection INJECT 60 MG INTO THE SKIN EVERY 6 (SIX) MONTHS. 1 mL 0 Maarch 2022 at unknown time   hydroxychloroquine (PLAQUENIL) 200 MG tablet TAKE 1 TABLET BY MOUTH DAILY FOR RHEUMATOID ARTHRITIS 90 tablet 0 03/26/2021   Hypromellose (ARTIFICIAL TEARS OP) Apply 1 drop to eye daily as needed (dry eyes).   unknown   irbesartan (AVAPRO) 300 MG tablet TAKE 1 TABLET(300 MG) BY MOUTH DAILY 90 tablet 2 03/25/2021   iron polysaccharides (NIFEREX) 150 MG capsule Take 150 mg by mouth daily.   03/25/2021   Liniments (BEN GAY EX) Apply 1 application topically daily as needed (pain).   unknown   SYNTHROID 88 MCG tablet TAKE 1 TABLET BY MOUTH EVERY DAY MONDAY TO SATURDAY AND OFF ON SUNDAYS 90 tablet 0 2/99/2426   trolamine salicylate (ASPERCREME) 10 %  cream Apply 1 application topically daily as needed for muscle pain.   unknown   Accu-Chek Softclix Lancets lancets Use to check blood sugars daily E11.69 100 each 2    Alcohol Swabs (ALCOHOL PADS) 70 % PADS Use as directed to check blood sugars 1 time per day dx: e11.22 150 each 2    Blood Glucose Calibration (TRUE METRIX LEVEL 1) Low SOLN Use as directed to check blood sugars 1 time per day dx: e11.22 (Patient not taking: No sig reported) 1 each 1    glucose blood (ACCU-CHEK GUIDE) test strip Use to check blood sugars daily E11.69 100 each 2    tacrolimus (PROTOPIC) 0.1 % ointment Apply topically daily. (Patient not taking: Reported on 03/26/2021) 30 g 6 Completed Course   Scheduled:   allopurinol  100 mg Oral Daily    amLODipine  2.5 mg Oral Daily   aspirin EC  81 mg Oral Daily   atorvastatin  80 mg Oral Daily   feeding supplement  237 mL Oral BID BM   hydroxychloroquine  200 mg Oral Daily   irbesartan  300 mg Oral Daily   levothyroxine  88 mcg Oral Once per day on Mon Tue Wed Thu Fri Sat   metoprolol tartrate  25 mg Oral BID   mometasone-formoterol  2 puff Inhalation BID   prochlorperazine  5 mg Intravenous STAT   sodium chloride flush  3 mL Intravenous Q12H   Infusions:   heparin 700 Units/hr (03/26/21 2211)   Assessment: 75 year old female with a history of hypertension, hyperlipidemia, diabetes who presents with A. fib with RVR and elevated trops.  Initial heparin level therapeutic, H/H stable, pltc down slightly.  Goal of Therapy:  Heparin level 0.3-0.7 units/ml Monitor platelets by anticoagulation protocol: Yes   Plan:  Heparin 700 units/h Daily heparin level and CBC  Arrie Senate, PharmD, [place], [facility] Clinical Pharmacist [phone] Please check AMION for all [facility] Pharmacy numbers 03/27/2021

## 2021-03-27 NOTE — Progress Notes (Signed)
Initial Nutrition Assessment  DOCUMENTATION CODES:   Obesity unspecified  INTERVENTION:   -Ensure Enlive po BID, each supplement provides 350 kcal and 20 grams of protein  -Magic cup BID with meals, each supplement provides 290 kcal and 9 grams of protein  NUTRITION DIAGNOSIS:   Increased nutrient needs related to acute illness as evidenced by estimated needs.  GOAL:   Patient will meet greater than or equal to 90% of their needs  MONITOR:   PO intake, Supplement acceptance, Labs, Weight trends, I & O's  REASON FOR ASSESSMENT:   Malnutrition Screening Tool    ASSESSMENT:   75 y.o. female with medical history significant of anemia, asthma, hypertension, CKD 3B, diabetes, MGUS, hypothyroidism, rheumatoid arthritis, lupus, diabetes, vitiligo, gout, hyperlipidemia who [facility] found to be [facility]cardic.  [facility] due to 1 week of cough and fatigue.  Also with chest pressure x1 day.  Has also had palpitations on and off for a year.  She presented there for evaluation of COVID status which did come back positive.  Patient consuming 100% of meals so far this admission. Pt with decreased appetite PTA d/t COVID-19 symptoms. Ensure supplements have been ordered and pt is accepting them currently. Having a bit of nausea today as well.  Will add Magic cups to meals for additional kcals and protein.  Per weight records, pt with stable weights.   Medications: Compazine  Labs reviewed: CBGs: 73   NUTRITION - FOCUSED PHYSICAL EXAM:  Unable to complete  Diet Order:   Diet Order             Diet Heart Room service appropriate? Yes; Fluid consistency: Thin  Diet effective now                   EDUCATION NEEDS:   No education needs have been identified at this time  Skin:  Skin Assessment: Reviewed RN Assessment  Last BM:  6/30 -type 6  Height:   Ht Readings from Last 1 Encounters:  03/26/21 4\' 10"  (1.473  m)    Weight:   Wt Readings from Last 1 Encounters:  03/27/21 70.4 kg    BMI:  Body mass index is 32.42 kg/m.  Estimated Nutritional Needs:   Kcal:  1500-1700  Protein:  65-75g  Fluid:  1.7L/day   Clayton Bibles, MS, RD, LDN Inpatient Clinical Dietitian Contact information available via Amion

## 2021-03-27 NOTE — Plan of Care (Signed)

## 2021-03-27 NOTE — Care Management Obs Status (Signed)
[place] NOTIFICATION   Patient Details  Name: Hannah [name] MRN: [id number] Date of Birth: Jan 11, 1946   Medicare Observation Status Notification Given:  Yes    Zenon Mayo, RN 03/27/2021, 3:48 PM

## 2021-03-27 NOTE — Progress Notes (Signed)
Patient c/o of nausea. MD notified. New order received for Compazine. Patient feeling better when RN returned to administer med. Patient declined med.

## 2021-03-27 NOTE — Progress Notes (Addendum)
PROGRESS NOTE    Hannah [name]  GGY:[id number] DOB: [birth year] DOA: 03/26/2021 PCP: Glendale Chard, MD    Brief Narrative:  Hannah [name] was admitted to the hospital with a working diagnosis of new onset atrial fibrillation.  75 year old female past medical history for asthma, hypertension, chronic kidney disease stage IIIb, type II diabetes mellitus, mugs, hypothyroidism, rheumatoid arthritis, lupus, gout, and dyslipidemia who presented with palpitations. She tested positive for COVID-19, while in the outpatient clinic she developed tachycardia, EMS was called, she was noted to have a heart rate of 160 bpm that prompted her to be brought to the hospital.  On her way to the hospital she converted back to sinus rhythm.  Her blood pressure was 143/71, heart rate 74, respiratory rate 23, oxygen saturation 98%, her lungs were clear to auscultation bilaterally, heart S1-S2, present, rhythmic, abdomen soft, no lower extremity edema.  Sodium 137, potassium 4.5, chloride 102, bicarb 24, glucose 98, BUN 23, creatinine 1.57, anion gap 11, high sensitive troponin [phone]. White count 6.2, hemoglobin 9.9, hematocrit 36.3, platelets 121. SARS COVID-19 positive.  Chest radiograph no infiltrates.  EKG 73 bpm, left axis deviation, left anterior fascicular block, normal intervals, sinus rhythm, Q-wave V1-V2, no ST segment or T wave changes.  Assessment & Plan:   Principal Problem:   Demand ischemia ([place]) Active Problems:   Asthma   Essential hypertension   Rheumatoid arthritis involving multiple sites with positive rheumatoid factor (HCC)   Iron deficiency anemia   Type 2 diabetes mellitus with stage 3 chronic kidney disease, without long-term current use of insulin (HCC)   Primary hypothyroidism   Unspecified atrial fibrillation ([place])   COVID-19 virus infection   New onset atrial fibrillation. Patient continue to be sinus rhythm. No chest pain or palpitations. Positive  elevation in troponin, significant elevation, need to rule out ischemic event. Continue with heparin IV for anticoagulation and follow on echocardiogram. If positive wall motion abnormalities, will need further ischemic work up as inpatient.   Continue with B blocker and asa for now.   2. Positive SARS COVID 19 infection. Patient has been vaccinated and boosted #2.  No clinical signs of viral pneumonia. No indication for antiviral therapy or steroids.   3. HTN. Continue blood pressure control with amlodipine, irbesartan, metoprolol.   4. COPD. No clinical signs of exacerbation, continue with with dulera.   5. Hypothyroid. Continue with levothyroxine.   6. Dyslipidemia. Continue with atorvastatin.     7. RA. Continue with plaquenil   8. CKD stage 3b. Renal function stable with serum cr at 1,34 with K at 4,2 and serum bicarbonate at 25. Follow up renal function in am, avoid hypotension and nephrotoxic medications.   Patient continue to be at high risk for recurrent atrial fibrillation   Status is: Observation  The patient remains OBS appropriate and will d/c before 2 midnights.  Dispo: The patient is from: Home              Anticipated d/c is to: Home              Patient currently is not medically stable to d/c.   Difficult to place patient No   DVT prophylaxis: Heparin   Code Status:   full  Family Communication:  I spoke with patient's sister at the bedside, we talked in detail about patient's condition, plan of care and prognosis and all questions were addressed.      Nutrition Status: Nutrition Problem: Increased nutrient needs Etiology: acute illness  Signs/Symptoms: estimated needs Interventions: Ensure Enlive (each supplement provides 350kcal and 20 grams of protein), Magic cup     Skin Documentation:     Consultants:  Cardiology   Subjective: Patient is feeling better, no dyspnea or chest pain, no nausea or vomiting,   Objective: Vitals:   03/27/21  0331 03/27/21 0813 03/27/21 0900 03/27/21 0955  BP: 132/62 125/76  125/62  Pulse: (!) 55 (!) 55    Resp: 20 20    Temp: 99.2 F (37.3 C) 98.1 F (36.7 C) 98.7 F (37.1 C)   TempSrc: Oral Oral Oral   SpO2: 98% 97%    Weight:      Height:        Intake/Output Summary (Last 24 hours) at 03/27/2021 1613 Last data filed at 03/27/2021 1003 Gross per 24 hour  Intake 352.35 ml  Output 900 ml  Net -547.65 ml   Filed Weights   03/26/21 1341 03/26/21 2103 03/27/21 0312  Weight: 70.4 kg 70.4 kg 70.4 kg    Examination:   General: Not in pain or dyspnea,  Neurology: Awake and alert, non focal  E ENT: no pallor, no icterus, oral mucosa moist Cardiovascular: No JVD. S1-S2 present, rhythmic, no gallops, rubs, or murmurs. No lower extremity edema. Pulmonary: positive breath sounds bilaterally, adequate air movement, no wheezing, rhonchi or rales. Gastrointestinal. Abdomen soft and non tender Skin. No rashes Musculoskeletal: no joint deformities     Data Reviewed: I have personally reviewed following labs and imaging studies  CBC: Recent Labs  Lab 03/26/21 1436 03/27/21 0519  WBC 6.2 3.7*  NEUTROABS 4.7  --   HGB 11.9* 10.2*  HCT 36.3 31.2*  MCV 98.6 96.6  PLT 121* 89*   Basic Metabolic Panel: Recent Labs  Lab 03/26/21 1436 03/27/21 0519  NA 137 136  K 4.5 4.2  CL 102 102  CO2 24 25  GLUCOSE 98 84  BUN 23 26*  CREATININE 1.57* 1.34*  CALCIUM 9.3 8.5*  MG 1.8  --    GFR: Estimated Creatinine Clearance: 30.2 mL/min (A) (by C-G formula based on SCr of 1.34 mg/dL (H)). Liver Function Tests: Recent Labs  Lab 03/26/21 1436 03/27/21 0519  AST 26 31  ALT 17 18  ALKPHOS 40 36*  BILITOT 0.7 0.4  PROT 6.5 5.7*  ALBUMIN 3.6 3.0*   No results for input(s): LIPASE, AMYLASE in the last 168 hours. No results for input(s): AMMONIA in the last 168 hours. Coagulation Profile: No results for input(s): INR, PROTIME in the last 168 hours. Cardiac Enzymes: No results for  input(s): CKTOTAL, CKMB, CKMBINDEX, TROPONINI in the last 168 hours. BNP (last 3 results) No results for input(s): PROBNP in the last 8760 hours. HbA1C: No results for input(s): HGBA1C in the last 72 hours. CBG: Recent Labs  Lab 03/26/21 2148 03/27/21 1213  GLUCAP 73 95   Lipid Profile: No results for input(s): CHOL, HDL, LDLCALC, TRIG, CHOLHDL, LDLDIRECT in the last 72 hours. Thyroid Function Tests: Recent Labs    03/26/21 1436  TSH 0.690   Anemia Panel: No results for input(s): VITAMINB12, FOLATE, FERRITIN, TIBC, IRON, RETICCTPCT in the last 72 hours.    Radiology Studies: I have reviewed all of the imaging during this hospital visit personally     Scheduled Meds:  allopurinol  100 mg Oral Daily   amLODipine  2.5 mg Oral Daily   aspirin EC  81 mg Oral Daily   atorvastatin  80 mg Oral Daily   feeding supplement  237 mL Oral BID BM   hydroxychloroquine  200 mg Oral Daily   irbesartan  300 mg Oral Daily   levothyroxine  88 mcg Oral Once per day on Mon Tue Wed Thu Fri Sat   metoprolol tartrate  25 mg Oral BID   mometasone-formoterol  2 puff Inhalation BID   prochlorperazine  5 mg Intravenous STAT   sodium chloride flush  3 mL Intravenous Q12H   Continuous Infusions:  heparin 700 Units/hr (03/27/21 1003)     LOS: 0 days        Kaleem Sartwell Gerome Apley, MD

## 2021-03-28 ENCOUNTER — Other Ambulatory Visit (HOSPITAL_COMMUNITY): Payer: Self-pay

## 2021-03-28 ENCOUNTER — Encounter: Payer: Self-pay | Admitting: Hematology

## 2021-03-28 ENCOUNTER — Ambulatory Visit: Payer: Medicare HMO

## 2021-03-28 DIAGNOSIS — I129 Hypertensive chronic kidney disease with stage 1 through stage 4 chronic kidney disease, or unspecified chronic kidney disease: Secondary | ICD-10-CM

## 2021-03-28 DIAGNOSIS — E1122 Type 2 diabetes mellitus with diabetic chronic kidney disease: Secondary | ICD-10-CM

## 2021-03-28 DIAGNOSIS — J4520 Mild intermittent asthma, uncomplicated: Secondary | ICD-10-CM | POA: Diagnosis not present

## 2021-03-28 DIAGNOSIS — R778 Other specified abnormalities of plasma proteins: Secondary | ICD-10-CM | POA: Diagnosis not present

## 2021-03-28 DIAGNOSIS — N1831 Chronic kidney disease, stage 3a: Secondary | ICD-10-CM

## 2021-03-28 DIAGNOSIS — I4891 Unspecified atrial fibrillation: Secondary | ICD-10-CM

## 2021-03-28 DIAGNOSIS — M0579 Rheumatoid arthritis with rheumatoid factor of multiple sites without organ or systems involvement: Secondary | ICD-10-CM | POA: Diagnosis not present

## 2021-03-28 DIAGNOSIS — I4892 Unspecified atrial flutter: Secondary | ICD-10-CM | POA: Diagnosis not present

## 2021-03-28 DIAGNOSIS — U071 COVID-19: Secondary | ICD-10-CM | POA: Diagnosis not present

## 2021-03-28 DIAGNOSIS — I248 Other forms of acute ischemic heart disease: Secondary | ICD-10-CM | POA: Diagnosis not present

## 2021-03-28 DIAGNOSIS — I10 Essential (primary) hypertension: Secondary | ICD-10-CM | POA: Diagnosis not present

## 2021-03-28 LAB — HEPARIN LEVEL (UNFRACTIONATED): Heparin Unfractionated: 0.59 IU/mL (ref 0.30–0.70)

## 2021-03-28 LAB — CBC
HCT: 30.9 % — ABNORMAL LOW (ref 36.0–46.0)
Hemoglobin: 9.9 g/dL — ABNORMAL LOW (ref 12.0–15.0)
MCH: 30.9 pg (ref 26.0–34.0)
MCHC: 32 g/dL (ref 30.0–36.0)
MCV: 96.6 fL (ref 80.0–100.0)
Platelets: 105 10*3/uL — ABNORMAL LOW (ref 150–400)
RBC: 3.2 MIL/uL — ABNORMAL LOW (ref 3.87–5.11)
RDW: 13.8 % (ref 11.5–15.5)
WBC: 3.2 10*3/uL — ABNORMAL LOW (ref 4.0–10.5)
nRBC: 0 % (ref 0.0–0.2)

## 2021-03-28 LAB — BASIC METABOLIC PANEL
Anion gap: 6 (ref 5–15)
BUN: 25 mg/dL — ABNORMAL HIGH (ref 8–23)
CO2: 26 mmol/L (ref 22–32)
Calcium: 8.2 mg/dL — ABNORMAL LOW (ref 8.9–10.3)
Chloride: 106 mmol/L (ref 98–111)
Creatinine, Ser: 1.24 mg/dL — ABNORMAL HIGH (ref 0.44–1.00)
GFR, Estimated: 45 mL/min — ABNORMAL LOW (ref >60)
Glucose, Bld: 82 mg/dL (ref 70–99)
Potassium: 4.2 mmol/L (ref 3.5–5.1)
Sodium: 138 mmol/L (ref 135–145)

## 2021-03-28 LAB — GLUCOSE, CAPILLARY: Glucose-Capillary: 87 mg/dL (ref 70–99)

## 2021-03-28 MED ORDER — ENSURE ENLIVE PO LIQD
237.0000 mL | Freq: Two times a day (BID) | ORAL | Status: DC
  Administered 2021-03-28: 237 mL via ORAL

## 2021-03-28 MED ORDER — APIXABAN 5 MG PO TABS
5.0000 mg | ORAL_TABLET | Freq: Two times a day (BID) | ORAL | Status: DC
  Administered 2021-03-28: 5 mg via ORAL
  Filled 2021-03-28: qty 1

## 2021-03-28 MED ORDER — METOPROLOL SUCCINATE ER 25 MG PO TB24: 25.0000 mg | ORAL_TABLET | Freq: Every day | ORAL | Status: DC

## 2021-03-28 MED ORDER — DEXTROMETHORPHAN-GUAIFENESIN 10-100 MG/5ML PO SYRP
5.0000 mL | ORAL_SOLUTION | Freq: Four times a day (QID) | ORAL | 0 refills | Status: DC | PRN
  Filled 2021-03-28: qty 118, 6d supply, fill #0

## 2021-03-28 MED ORDER — GUAIFENESIN-DM 100-10 MG/5ML PO SYRP: 5.0000 mL | ORAL_SOLUTION | ORAL | Status: DC | PRN

## 2021-03-28 MED ORDER — METOPROLOL SUCCINATE ER 25 MG PO TB24
25.0000 mg | ORAL_TABLET | Freq: Every day | ORAL | 0 refills | Status: DC
  Filled 2021-03-28: qty 30, 30d supply, fill #0

## 2021-03-28 MED ORDER — APIXABAN 5 MG PO TABS
5.0000 mg | ORAL_TABLET | Freq: Two times a day (BID) | ORAL | 0 refills | Status: DC
  Filled 2021-03-28: qty 60, 30d supply, fill #0

## 2021-03-28 NOTE — Progress Notes (Addendum)
ANTICOAGULATION CONSULT NOTE - Follow Up Consult  Pharmacy Consult for Heparin Indication: chest pain/ACS  Allergies  Allergen Reactions   Shellfish Allergy Anaphylaxis and Hives   Shellfish-Derived Products Anaphylaxis    Patient Measurements: Height: 4\' 10"  (147.3 cm) Weight: 69.4 kg (153 lb 1.6 oz) IBW/kg (Calculated) : 40.9 Heparin Dosing Weight: 56.9kg  Vital Signs: Temp: 98.6 F (37 C) (07/01 0748) Temp Source: Oral (07/01 0748) BP: 116/65 (07/01 0748) Pulse Rate: 54 (07/01 0748)  Labs: Recent Labs    03/26/21 1436 03/26/21 1636 03/26/21 2003 03/26/21 2210 03/27/21 0519 03/28/21 0344  HGB 11.9*  --   --   --  10.2* 9.9*  HCT 36.3  --   --   --  31.2* 30.9*  PLT 121*  --   --   --  89* 105*  HEPARINUNFRC  --   --   --   --  0.53 0.59  CREATININE 1.57*  --   --   --  1.34* 1.24*  TROPONINIHS 713* 1,983* 2,306* 2,478*  --   --      Estimated Creatinine Clearance: 32.4 mL/min (A) (by C-G formula based on SCr of 1.24 mg/dL (H)).   Medications:  Medications Prior to Admission  Medication Sig Dispense Refill Last Dose   albuterol (PROVENTIL HFA;VENTOLIN HFA) 108 (90 Base) MCG/ACT inhaler Inhale 2 puffs into the lungs every 6 (six) hours as needed for wheezing or shortness of breath. 1 Inhaler 2 unknown   allopurinol (ZYLOPRIM) 100 MG tablet TAKE 1 TABLET BY MOUTH EVERY DAY FOR GOUT 90 tablet 1 03/26/2021   amLODipine (NORVASC) 2.5 MG tablet TAKE 1 TABLET(2.5 MG) BY MOUTH DAILY 30 tablet 11 03/26/2021   amLODipine (NORVASC) 5 MG tablet TAKE 1/2 TABLET BY MOUTH EVERY MORNING AND 1 TABLET EVERY EVENING (Patient taking differently: Take 5 mg by mouth every evening.) 135 tablet 1 03/25/2021   Ascorbic Acid (VITAMIN C) 1000 MG tablet Take 1,000 mg by mouth daily.   03/25/2021   aspirin EC 81 MG tablet Take 81 mg by mouth daily.   03/26/2021   atorvastatin (LIPITOR) 80 MG tablet TAKE 1 TABLET BY MOUTH EVERY DAY 90 tablet 1 03/26/2021   budesonide-formoterol (SYMBICORT)  160-4.5 MCG/ACT inhaler INHALE 2 PUFFS BY MOUTH TWICE DAILY IN THE MORNING AND EVENING (Patient taking differently: Inhale 2 puffs into the lungs 2 (two) times daily as needed (sob/wheezing).) 10.2 g 2 unknown   Calcium Carb-Cholecalciferol 600-800 MG-UNIT TABS Take 1 tablet by mouth daily.    03/25/2021   Cholecalciferol (VITAMIN D PO) Take 2,000 Units by mouth daily.    03/25/2021   cyanocobalamin 1000 MCG tablet Take 1,000 mcg by mouth daily.   03/25/2021   denosumab (PROLIA) 60 MG/ML SOSY injection INJECT 60 MG INTO THE SKIN EVERY 6 (SIX) MONTHS. 1 mL 0 Maarch 2022 at unknown time   hydroxychloroquine (PLAQUENIL) 200 MG tablet TAKE 1 TABLET BY MOUTH DAILY FOR RHEUMATOID ARTHRITIS 90 tablet 0 03/26/2021   Hypromellose (ARTIFICIAL TEARS OP) Apply 1 drop to eye daily as needed (dry eyes).   unknown   irbesartan (AVAPRO) 300 MG tablet TAKE 1 TABLET(300 MG) BY MOUTH DAILY 90 tablet 2 03/25/2021   iron polysaccharides (NIFEREX) 150 MG capsule Take 150 mg by mouth daily.   03/25/2021   Liniments (BEN GAY EX) Apply 1 application topically daily as needed (pain).   unknown   SYNTHROID 88 MCG tablet TAKE 1 TABLET BY MOUTH EVERY DAY MONDAY TO SATURDAY AND OFF ON SUNDAYS  90 tablet 0 3/97/6734   trolamine salicylate (ASPERCREME) 10 % cream Apply 1 application topically daily as needed for muscle pain.   unknown   Accu-Chek Softclix Lancets lancets Use to check blood sugars daily E11.69 100 each 2    Alcohol Swabs (ALCOHOL PADS) 70 % PADS Use as directed to check blood sugars 1 time per day dx: e11.22 150 each 2    Blood Glucose Calibration (TRUE METRIX LEVEL 1) Low SOLN Use as directed to check blood sugars 1 time per day dx: e11.22 (Patient not taking: No sig reported) 1 each 1    glucose blood (ACCU-CHEK GUIDE) test strip Use to check blood sugars daily E11.69 100 each 2    tacrolimus (PROTOPIC) 0.1 % ointment Apply topically daily. (Patient not taking: Reported on 03/26/2021) 30 g 6 Completed Course    Scheduled:   allopurinol  100 mg Oral Daily   amLODipine  2.5 mg Oral Daily   aspirin EC  81 mg Oral Daily   atorvastatin  80 mg Oral Daily   feeding supplement  237 mL Oral BID BM   hydroxychloroquine  200 mg Oral Daily   irbesartan  300 mg Oral Daily   levothyroxine  88 mcg Oral Once per day on Mon Tue Wed Thu Fri Sat   metoprolol tartrate  25 mg Oral BID   mometasone-formoterol  2 puff Inhalation BID   sodium chloride flush  3 mL Intravenous Q12H   Infusions:   heparin 700 Units/hr (03/27/21 1003)   Assessment: 75 year old female with a history of hypertension, hyperlipidemia, diabetes who presents with SVT versus atrial flutter and elevated trops. Demand ischemia suspected per cardiology, EF= 55-60% with no wall motion abnormalities. -heparin level at goal  Goal of Therapy:  Heparin level 0.3-0.7 units/ml Monitor platelets by anticoagulation protocol: Yes   Plan:  Continue heparin 700 units/h Daily heparin level and CBC Will follow for possible anticoagulation for concern of afib  Hildred Laser, PharmD Clinical Pharmacist **Pharmacist phone directory can now be found on [email or web address] ([email or web address]).  Listed under [place].

## 2021-03-28 NOTE — Discharge Instructions (Signed)

## 2021-03-28 NOTE — Chronic Care Management (AMB) (Signed)
  Chronic Care Management   Inpatient Admit Review Note  03/28/2021 Name: Hannah [name] MRN: [id number] DOB: June 10, 1946  Hannah [name] is a 75 y.o. year old female who is a primary care patient of Glendale Chard, MD. Hannah [name] is actively engaged with the embedded care management team in the primary care practice and is being followed by Hannah [name] for assistance with disease management and care coordination needs related to DM, CKD Stage III, and Hypertensive Nephropathy .   Hannah [name] is currently admitted to the hospital for evaluation and treatment of  Tachyarrhythmia and COVID positive . Current treatment plan is outlined below:  Assessment & Plan:   Principal Problem:   Demand ischemia ([place]) Active Problems:   Asthma   Essential hypertension   Rheumatoid arthritis involving multiple sites with positive rheumatoid factor (HCC)   Iron deficiency anemia   Type 2 diabetes mellitus with stage 3 chronic kidney disease, without long-term current use of insulin (HCC)   Primary hypothyroidism   Unspecified atrial fibrillation ([place])   COVID-19 virus infection     New onset atrial fibrillation. Patient continue to be sinus rhythm. No chest pain or palpitations. Positive elevation in troponin, significant elevation, need to rule out ischemic event. Continue with heparin IV for anticoagulation and follow on echocardiogram. If positive wall motion abnormalities, will need further ischemic work up as inpatient.   Continue with B blocker and asa for now.   2. Positive SARS COVID 19 infection. Patient has been vaccinated and boosted #2. No clinical signs of viral pneumonia. No indication for antiviral therapy or steroids.   3. HTN. Continue blood pressure control with amlodipine, irbesartan, metoprolol.   4. COPD. No clinical signs of exacerbation, continue with with dulera.   5. Hypothyroid. Continue with levothyroxine.   6. Dyslipidemia.  Continue with atorvastatin.      7. RA. Continue with plaquenil    8. CKD stage 3b. Renal function stable with serum cr at 1,34 with K at 4,2 and serum bicarbonate at 25. Follow up renal function in am, avoid hypotension and nephrotoxic medications.    Patient continue to be at high risk for recurrent atrial fibrillation     Plan: CM team will collaborate with [facility] and will follow patient post discharge.    Daneen Schick, BSW, CDP Social Worker, Certified Dementia Practitioner [place] [facility] [place] [facility] [phone]

## 2021-03-28 NOTE — Progress Notes (Signed)
Progress Note  Patient Name: Hannah [name] Date of Encounter: 03/28/2021  [facility] Cardiologist: Mertie Moores, MD   Subjective   Denies any chest pain or dyspnea  Inpatient Medications    Scheduled Meds:  allopurinol  100 mg Oral Daily   amLODipine  2.5 mg Oral Daily   aspirin EC  81 mg Oral Daily   atorvastatin  80 mg Oral Daily   feeding supplement  237 mL Oral BID BM   hydroxychloroquine  200 mg Oral Daily   irbesartan  300 mg Oral Daily   levothyroxine  88 mcg Oral Once per day on Mon Tue Wed Thu Fri Sat   metoprolol tartrate  25 mg Oral BID   mometasone-formoterol  2 puff Inhalation BID   sodium chloride flush  3 mL Intravenous Q12H   Continuous Infusions:  heparin 700 Units/hr (03/27/21 1003)   PRN Meds: acetaminophen **OR** acetaminophen, albuterol, polyethylene glycol, polyvinyl alcohol   Vital Signs    Vitals:   03/28/21 0748 03/28/21 0800 03/28/21 0900 03/28/21 0906  BP: 116/65   108/65  Pulse: (!) 54 60 64 66  Resp: 18 15 (!) 23   Temp: 98.6 F (37 C)     TempSrc: Oral     SpO2: 97% 98% 100% 99%  Weight:      Height:        Intake/Output Summary (Last 24 hours) at 03/28/2021 0938 Last data filed at 03/28/2021 0915 Gross per 24 hour  Intake 777.92 ml  Output 300 ml  Net 477.92 ml    Last 3 Weights 03/28/2021 03/27/2021 03/26/2021  Weight (lbs) 153 lb 1.6 oz 155 lb 1.6 oz 155 lb 3.2 oz  Weight (kg) 69.446 kg 70.353 kg 70.398 kg      Telemetry    NSR, 50-60s - Personally Reviewed  ECG    No new ECG - Personally Reviewed  Physical Exam   GEN: No acute distress.   Neck: No JVD Cardiac: RRR, no murmurs, rubs, or gallops.  Respiratory: Clear to auscultation bilaterally. GI: Soft, nontender, non-distended  MS: No edema; No deformity. Neuro:  Nonfocal  Psych: Normal affect   Labs    High Sensitivity Troponin:   Recent Labs  Lab 03/26/21 1436 03/26/21 1636 03/26/21 2003 03/26/21 2210  TROPONINIHS 713* 1,983* 2,306* 2,478*        Chemistry Recent Labs  Lab 03/26/21 1436 03/27/21 0519 03/28/21 0344  NA 137 136 138  K 4.5 4.2 4.2  CL 102 102 106  CO2 24 25 26   GLUCOSE 98 84 82  BUN 23 26* 25*  CREATININE 1.57* 1.34* 1.24*  CALCIUM 9.3 8.5* 8.2*  PROT 6.5 5.7*  --   ALBUMIN 3.6 3.0*  --   AST 26 31  --   ALT 17 18  --   ALKPHOS 40 36*  --   BILITOT 0.7 0.4  --   GFRNONAA 34* 41* 45*  ANIONGAP 11 9 6       Hematology Recent Labs  Lab 03/26/21 1436 03/27/21 0519 03/28/21 0344  WBC 6.2 3.7* 3.2*  RBC 3.68* 3.23* 3.20*  HGB 11.9* 10.2* 9.9*  HCT 36.3 31.2* 30.9*  MCV 98.6 96.6 96.6  MCH 32.3 31.6 30.9  MCHC 32.8 32.7 32.0  RDW 14.1 13.8 13.8  PLT 121* 89* 105*     BNPNo results for input(s): BNP, PROBNP in the last 168 hours.   DDimer No results for input(s): DDIMER in the last 168 hours.   Radiology  DG Chest Portable 1 View  Result Date: 03/26/2021 CLINICAL DATA:  Cough and shortness of breath for 2 days, COVID-19 positivity EXAM: PORTABLE CHEST 1 VIEW COMPARISON:  09/24/2016 FINDINGS: Cardiac shadow is stable. Aortic calcifications are noted. Lungs are well aerated bilaterally. No focal infiltrate or effusion is seen. No bony abnormality is noted. IMPRESSION: No acute abnormality noted. Electronically Signed   By: Inez Catalina M.D.   On: 03/26/2021 16:23   ECHOCARDIOGRAM COMPLETE  Result Date: 03/27/2021    ECHOCARDIOGRAM REPORT   Patient Name:   Hannah [name] Date of Exam: 03/27/2021 Medical Rec #:  [id number]          Height:       58.0 in Accession #:    [id number]         Weight:       155.1 lb Date of Birth:  05-13-1946         BSA:          1.635 m Patient Age:    75 years           BP:           132/60 mmHg Patient Gender: F                  HR:           54 bpm. Exam Location:  Inpatient Procedure: 2D Echo, Cardiac Doppler and Color Doppler Indications:    NSTEMI I21.4  History:        Patient has no prior history of Echocardiogram examinations,                 most  recent 05/24/2018. Risk Factors:Hypertension, Diabetes and                 Dyslipidemia. COVID-19. Lupus.  Sonographer:    Jonelle Sidle Dance Referring Phys: [id number] [place]  1. Left ventricular ejection fraction, by estimation, is 55 to 60%. The left ventricle has normal function. The left ventricle has no regional wall motion abnormalities. There is mild asymmetric left ventricular hypertrophy of the basal-septal segment. Left ventricular diastolic parameters are consistent with Grade I diastolic dysfunction (impaired relaxation).  2. Right ventricular systolic function is normal. The right ventricular size is normal. There is normal pulmonary artery systolic pressure. The estimated right ventricular systolic pressure is 54.6 mmHg.  3. The mitral valve is grossly normal. Trivial mitral valve regurgitation. No evidence of mitral stenosis.  4. The aortic valve is tricuspid. There is mild calcification of the aortic valve. Aortic valve regurgitation is not visualized. Mild aortic valve sclerosis is present, with no evidence of aortic valve stenosis.  5. The inferior vena cava is normal in size with greater than 50% respiratory variability, suggesting right atrial pressure of 3 mmHg. Comparison(s): No significant change from prior study. FINDINGS  Left Ventricle: Left ventricular ejection fraction, by estimation, is 55 to 60%. The left ventricle has normal function. The left ventricle has no regional wall motion abnormalities. The left ventricular internal cavity size was normal in size. There is  mild asymmetric left ventricular hypertrophy of the basal-septal segment. Left ventricular diastolic parameters are consistent with Grade I diastolic dysfunction (impaired relaxation). Right Ventricle: The right ventricular size is normal. No increase in right ventricular wall thickness. Right ventricular systolic function is normal. There is normal pulmonary artery systolic pressure. The tricuspid  regurgitant velocity is 2.46 m/s, and  with an assumed right atrial pressure of 3 mmHg,  the estimated right ventricular systolic pressure is 95.6 mmHg. Left Atrium: Left atrial size was normal in size. Right Atrium: Right atrial size was normal in size. Pericardium: Trivial pericardial effusion is present. Mitral Valve: The mitral valve is grossly normal. Trivial mitral valve regurgitation. No evidence of mitral valve stenosis. Tricuspid Valve: The tricuspid valve is grossly normal. Tricuspid valve regurgitation is trivial. No evidence of tricuspid stenosis. Aortic Valve: The aortic valve is tricuspid. There is mild calcification of the aortic valve. Aortic valve regurgitation is not visualized. Mild aortic valve sclerosis is present, with no evidence of aortic valve stenosis. Pulmonic Valve: The pulmonic valve was grossly normal. Pulmonic valve regurgitation is trivial. No evidence of pulmonic stenosis. Aorta: The aortic root and ascending aorta are structurally normal, with no evidence of dilitation. Venous: The inferior vena cava is normal in size with greater than 50% respiratory variability, suggesting right atrial pressure of 3 mmHg. IAS/Shunts: The atrial septum is grossly normal.  LEFT VENTRICLE PLAX 2D LVIDd:         4.00 cm  Diastology LVIDs:         2.80 cm  LV e' medial:   6.64 cm/s LV PW:         1.10 cm  LV E/e' medial: 7.9 LV IVS:        1.10 cm LVOT diam:     2.00 cm LV SV:         80 LV SV Index:   49 LVOT Area:     3.14 cm  RIGHT VENTRICLE          IVC RV Basal diam:  2.90 cm  IVC diam: 1.20 cm TAPSE (M-mode): 1.8 cm LEFT ATRIUM             Index       RIGHT ATRIUM           Index LA diam:        3.80 cm 2.32 cm/m  RA Area:     15.00 cm LA Vol (A2C):   48.4 ml 29.61 ml/m RA Volume:   40.00 ml  24.47 ml/m LA Vol (A4C):   31.6 ml 19.33 ml/m LA Biplane Vol: 39.0 ml 23.86 ml/m  AORTIC VALVE LVOT Vmax:   104.00 cm/s LVOT Vmean:  77.000 cm/s LVOT VTI:    0.254 m  AORTA Ao Root diam: 3.10 cm Ao Asc  diam:  2.90 cm MITRAL VALVE               TRICUSPID VALVE MV Area (PHT): 2.26 cm    TR Peak grad:   24.2 mmHg MV Decel Time: 335 msec    TR Vmax:        246.00 cm/s MV E velocity: 52.30 cm/s MV A velocity: 60.00 cm/s  SHUNTS MV E/A ratio:  0.87        Systemic VTI:  0.25 m                            Systemic Diam: 2.00 cm Eleonore Chiquito MD Electronically signed by Eleonore Chiquito MD Signature Date/Time: 03/27/2021/6:27:17 PM    Final     Cardiac Studies     Patient Profile     74 y.o. female with a history of hypertension, hyperlipidemia, diabetes who presents with Atrial flutter and troponin elevation  Assessment & Plan   Tachyarrhythmia: EKG from EMS shows regular narrow complex tachycardia at rate 150.  Differential includes  SVT versus atrial flutter.  No clear flutter waves, but given rate 150 suspicious for 2-1 atrial flutter.  CHA2DS2-VASc score 5 .  Echo shows normal biventricular function, no significant disease.  (hypertension, age x2, diabetes, female) -Continue metoprolol , will consolidate to toprol XL 25 mg daily -Will switch from heparin drip to Eliquis 5 mg BID   Troponin elevation: troponin 713>1983>2306>2478.  Denies any chest pain, only reported palpitations.  Given lack of chest pain, suspect demand ischemia in setting of tachyarrhythmia as above.  Echocardiogram shows normal systolic function. -Given no anginal symptoms or wall motion abnormality on echo, suspect this represents demand ischemia in setting of tachyarrhythmia.  She very well could have underlying CAD however.  Would recommend outpatient follow-up once she recovers from her COVID-19 infection to consider CAD evaluation.  She would be a good candidate for coronary CTA with low resting HR  COVID-19 infection: treatment per primary team  [facility] will sign off.   Medication Recommendations:  Eliquis 5 mg BID, toprol XL 25 mg daily.  Otherwise continue home meds Other recommendations (labs, testing, etc):   None Follow up as an outpatient:  Appointment with Dr Acie Fredrickson on 04/16/21   For questions or updates, please contact [facility] Please consult [email or web address] for contact info under        Signed, Donato Heinz, MD  03/28/2021, 9:38 AM

## 2021-03-28 NOTE — Progress Notes (Signed)
ANTICOAGULATION CONSULT NOTE - Follow Up Consult  Pharmacy Consult for Heparin> apixaban Indication: chest pain/ACS, afib  Allergies  Allergen Reactions   Shellfish Allergy Anaphylaxis and Hives   Shellfish-Derived Products Anaphylaxis    Patient Measurements: Height: 4\' 10"  (147.3 cm) Weight: 69.4 kg (153 lb 1.6 oz) IBW/kg (Calculated) : 40.9 Heparin Dosing Weight: 56.9kg  Vital Signs: Temp: 98.6 F (37 C) (07/01 0748) Temp Source: Oral (07/01 0748) BP: 108/65 (07/01 0906) Pulse Rate: 66 (07/01 0906)  Labs: Recent Labs    03/26/21 1436 03/26/21 1636 03/26/21 2003 03/26/21 2210 03/27/21 0519 03/28/21 0344  HGB 11.9*  --   --   --  10.2* 9.9*  HCT 36.3  --   --   --  31.2* 30.9*  PLT 121*  --   --   --  89* 105*  HEPARINUNFRC  --   --   --   --  0.53 0.59  CREATININE 1.57*  --   --   --  1.34* 1.24*  TROPONINIHS 713* 1,983* 2,306* 2,478*  --   --      Estimated Creatinine Clearance: 32.4 mL/min (A) (by C-G formula based on SCr of 1.24 mg/dL (H)).   Medications:  Medications Prior to Admission  Medication Sig Dispense Refill Last Dose   albuterol (PROVENTIL HFA;VENTOLIN HFA) 108 (90 Base) MCG/ACT inhaler Inhale 2 puffs into the lungs every 6 (six) hours as needed for wheezing or shortness of breath. 1 Inhaler 2 unknown   allopurinol (ZYLOPRIM) 100 MG tablet TAKE 1 TABLET BY MOUTH EVERY DAY FOR GOUT 90 tablet 1 03/26/2021   amLODipine (NORVASC) 2.5 MG tablet TAKE 1 TABLET(2.5 MG) BY MOUTH DAILY 30 tablet 11 03/26/2021   amLODipine (NORVASC) 5 MG tablet TAKE 1/2 TABLET BY MOUTH EVERY MORNING AND 1 TABLET EVERY EVENING (Patient taking differently: Take 5 mg by mouth every evening.) 135 tablet 1 03/25/2021   Ascorbic Acid (VITAMIN C) 1000 MG tablet Take 1,000 mg by mouth daily.   03/25/2021   aspirin EC 81 MG tablet Take 81 mg by mouth daily.   03/26/2021   atorvastatin (LIPITOR) 80 MG tablet TAKE 1 TABLET BY MOUTH EVERY DAY 90 tablet 1 03/26/2021   budesonide-formoterol  (SYMBICORT) 160-4.5 MCG/ACT inhaler INHALE 2 PUFFS BY MOUTH TWICE DAILY IN THE MORNING AND EVENING (Patient taking differently: Inhale 2 puffs into the lungs 2 (two) times daily as needed (sob/wheezing).) 10.2 g 2 unknown   Calcium Carb-Cholecalciferol 600-800 MG-UNIT TABS Take 1 tablet by mouth daily.    03/25/2021   Cholecalciferol (VITAMIN D PO) Take 2,000 Units by mouth daily.    03/25/2021   cyanocobalamin 1000 MCG tablet Take 1,000 mcg by mouth daily.   03/25/2021   denosumab (PROLIA) 60 MG/ML SOSY injection INJECT 60 MG INTO THE SKIN EVERY 6 (SIX) MONTHS. 1 mL 0 Maarch 2022 at unknown time   hydroxychloroquine (PLAQUENIL) 200 MG tablet TAKE 1 TABLET BY MOUTH DAILY FOR RHEUMATOID ARTHRITIS 90 tablet 0 03/26/2021   Hypromellose (ARTIFICIAL TEARS OP) Apply 1 drop to eye daily as needed (dry eyes).   unknown   irbesartan (AVAPRO) 300 MG tablet TAKE 1 TABLET(300 MG) BY MOUTH DAILY 90 tablet 2 03/25/2021   iron polysaccharides (NIFEREX) 150 MG capsule Take 150 mg by mouth daily.   03/25/2021   Liniments (BEN GAY EX) Apply 1 application topically daily as needed (pain).   unknown   SYNTHROID 88 MCG tablet TAKE 1 TABLET BY MOUTH EVERY DAY MONDAY TO SATURDAY AND OFF  ON SUNDAYS 90 tablet 0 1/61/0960   trolamine salicylate (ASPERCREME) 10 % cream Apply 1 application topically daily as needed for muscle pain.   unknown   Accu-Chek Softclix Lancets lancets Use to check blood sugars daily E11.69 100 each 2    Alcohol Swabs (ALCOHOL PADS) 70 % PADS Use as directed to check blood sugars 1 time per day dx: e11.22 150 each 2    Blood Glucose Calibration (TRUE METRIX LEVEL 1) Low SOLN Use as directed to check blood sugars 1 time per day dx: e11.22 (Patient not taking: No sig reported) 1 each 1    glucose blood (ACCU-CHEK GUIDE) test strip Use to check blood sugars daily E11.69 100 each 2    tacrolimus (PROTOPIC) 0.1 % ointment Apply topically daily. (Patient not taking: Reported on 03/26/2021) 30 g 6 Completed Course    Scheduled:   allopurinol  100 mg Oral Daily   amLODipine  2.5 mg Oral Daily   atorvastatin  80 mg Oral Daily   feeding supplement  237 mL Oral BID BM   hydroxychloroquine  200 mg Oral Daily   irbesartan  300 mg Oral Daily   levothyroxine  88 mcg Oral Once per day on Mon Tue Wed Thu Fri Sat   [START ON 03/29/2021] metoprolol succinate  25 mg Oral Daily   mometasone-formoterol  2 puff Inhalation BID   sodium chloride flush  3 mL Intravenous Q12H   Infusions:   heparin 700 Units/hr (03/27/21 1003)   Assessment: 75 year old female with a history of hypertension, hyperlipidemia, diabetes who presents with SVT versus atrial flutter and elevated trops. Demand ischemia suspected per cardiology, EF= 55-60% with no wall motion abnormalities. -heparin level at goal  Plans to start apixaban for afib -Wt ~ 69kg  Goal of Therapy:  Heparin level 0.3-0.7 units/ml Monitor platelets by anticoagulation protocol: Yes   Plan:  -Stop heparin  -apixaban 5mg  po bid  Hildred Laser, PharmD Clinical Pharmacist **Pharmacist phone directory can now be found on [email or web address] ([email or web address]).  Listed under [place].

## 2021-03-28 NOTE — TOC Benefit Eligibility Note (Signed)
Patient Teacher, English as a foreign language completed.    The patient is currently admitted and upon discharge could be taking Eliquis 5 mg.  The current 30 day co-pay is, $45.00.   The patient is currently admitted and upon discharge could be taking Xarelto 20 mg.  The current 30 day co-pay is, $45.00.   The patient is insured through [place], [place] Patient Advocate Specialist [place] Team Direct Number: [phone]  Fax: [phone]

## 2021-03-28 NOTE — Consult Note (Signed)
   [facility] Inpatient Consult   03/28/2021  Hannah [name] July 24, 1946 [id number]  [place] Organization [ACO] Patient: [name] by the Embedded LCSW of patient's hospitalization  Primary Care Provider:  Dr. Glendale Chard, [facility] [[facility]] is an Embedded practice where the patient has been active in a chronic care management program.  Electronic Medical record reviewed patient currently in observation status with COVID -19 positive.  Plan: Patient followed in progression meeting and made inpatient TOC aware of active in an Embedded CCM program.  Will update to the [place] [facility] and make aware of any updated needs for transitioning out of the hospital.  Please contact for further questions,  Natividad Brood, RN BSN [facility] Liaison  [phone] business mobile phone Toll free office [phone]  Fax number: [phone] [email or web address]

## 2021-03-28 NOTE — Discharge Summary (Signed)
Physician Discharge Summary  Hannah [name] FXT:[id number] DOB: Feb 14, 1946 DOA: 03/26/2021  PCP: Glendale Chard, MD  Admit date: 03/26/2021 Discharge date: 03/28/2021  Admitted From: Home  Disposition:  home   Recommendations for Outpatient Follow-up and new medication changes:  Follow up with Dr. Baird Cancer in 10 days.  Follow up with cardiology as scheduled . Patient placed on metoprolol XL for rate control and anticoagulation with apixaban.   Home Health: no   Equipment/Devices: no    Discharge Condition: stable  CODE STATUS: partial (no mechanical ventilation)  Diet recommendation:  heart healthy   Brief/Interim Summary: Hannah [name] was admitted to the hospital with a working diagnosis of new onset atrial fibrillation, complicated with elevated troponins in the setting of SARS COVID 19 infection.   75 year old female past medical history for asthma, hypertension, chronic kidney disease stage IIIb, type II diabetes mellitus, mugs, hypothyroidism, rheumatoid arthritis, lupus, gout, and dyslipidemia who presented with palpitations. She tested positive for COVID-19, while in the outpatient clinic she developed tachycardia, EMS was called, she was noted to have a heart rate of 160 bpm that prompted her to be brought to the hospital.  On her way to the hospital she converted back to sinus rhythm.  Her blood pressure was 143/71, heart rate 74, respiratory rate 23, oxygen saturation 98%, her lungs were clear to auscultation bilaterally, heart S1-S2, present, rhythmic, abdomen soft, no lower extremity edema.   Sodium 137, potassium 4.5, chloride 102, bicarb 24, glucose 98, BUN 23, creatinine 1.57, anion gap 11, high sensitive troponin [phone]. White count 6.2, hemoglobin 9.9, hematocrit 36.3, platelets 121. SARS COVID-19 positive.   Chest radiograph no infiltrates.   EKG 73 bpm, left axis deviation, left anterior fascicular block, normal intervals, sinus rhythm, Q-wave V1-V2,  no ST segment or T wave changes.  Patient was placed on heparin drip and echocardiogram was obtained.   No wall motion abnormalities, she remained on sinus rhythm.  Will follow as outpatient with cardiology.   Discharge Diagnoses:  Principal Problem:   Unspecified atrial fibrillation (HCC) Active Problems:   Asthma   Essential hypertension   Rheumatoid arthritis involving multiple sites with positive rheumatoid factor (HCC)   Iron deficiency anemia   Type 2 diabetes mellitus with stage 3 chronic kidney disease, without long-term current use of insulin (HCC)   Primary hypothyroidism   Demand ischemia ([place])   COVID-19 virus infection    New onset atrial fibrillation.  Patient was admitted to a telemetry unit, she was placed on IV heparin for anticoagulation. Her telemetry remained sinus rhythm, no chest pain or further palpitations.  Underwent further work-up with echocardiography which showed left ventricle ejection fraction 55 to 60%.  No wall motion normalities.  Mild asymmetric left ventricular hypertrophy of the basal septal segment.  No significant valvular disease.  Patient will continue rate control with metoprolol and anticoagulation with apixaban.   2. Positive SARS COVID 19 infection.  Patient has been vaccinated and boosted for COVID-19. No signs of viral pneumonia. Continue symptomatic control and follow-up as outpatient.   3. HTN.  Her blood pressure remained stable, continue amlodipine, irbesartan and metoprolol.   4. COPD/ Asthma.  No signs of acute exacerbation, continue budesonide and formoterol.   5. Hypothyroid. On levothyroxine.   6. Dyslipidemia. On atorvastatin.      7. RA. No exacerbation, will continue with hydroxychloroquine    8. CKD stage 3b.  Her renal function remained stable, at discharge sodium 138, potassium 4.2, chloride 106, bicarb 26,  glucose 82, BUN 25, creatinine 1.24.  9.  Reactive leukopenia, anemia and thrombocytopenia.  Iron  deficiency anemia.  Likely related to acute viral illness, discharge white cell count 3.2, hemoglobin 9.9, hematocrit 30.9, platelets 105. Continue anticoagulation with apixaban.   Discharge Instructions   Allergies as of 03/28/2021       Reactions   Shellfish Allergy Anaphylaxis, Hives   Shellfish-derived Products Anaphylaxis        Medication List     STOP taking these medications    tacrolimus 0.1 % ointment Commonly known as: PROTOPIC   True Metrix Level 1 Low Soln       TAKE these medications    Accu-Chek Guide test strip Generic drug: glucose blood Use to check blood sugars daily E11.69   Accu-Chek Softclix Lancets lancets Use to check blood sugars daily E11.69   albuterol 108 (90 Base) MCG/ACT inhaler Commonly known as: VENTOLIN HFA Inhale 2 puffs into the lungs every 6 (six) hours as needed for wheezing or shortness of breath.   Alcohol Pads 70 % Pads Use as directed to check blood sugars 1 time per day dx: e11.22   allopurinol 100 MG tablet Commonly known as: ZYLOPRIM TAKE 1 TABLET BY MOUTH EVERY DAY FOR GOUT   amLODipine 2.5 MG tablet Commonly known as: NORVASC TAKE 1 TABLET(2.5 MG) BY MOUTH DAILY What changed: Another medication with the same name was removed. Continue taking this medication, and follow the directions you see here.   apixaban 5 MG Tabs tablet Commonly known as: ELIQUIS Take 1 tablet (5 mg total) by mouth 2 (two) times daily.   ARTIFICIAL TEARS OP Apply 1 drop to eye daily as needed (dry eyes).   aspirin EC 81 MG tablet Take 81 mg by mouth daily.   atorvastatin 80 MG tablet Commonly known as: LIPITOR TAKE 1 TABLET BY MOUTH EVERY DAY   BEN GAY EX Apply 1 application topically daily as needed (pain).   budesonide-formoterol 160-4.5 MCG/ACT inhaler Commonly known as: Symbicort INHALE 2 PUFFS BY MOUTH TWICE DAILY IN THE MORNING AND EVENING What changed:  how much to take how to take this when to take this reasons to  take this additional instructions   Calcium Carb-Cholecalciferol 600-800 MG-UNIT Tabs Take 1 tablet by mouth daily.   cyanocobalamin 1000 MCG tablet Take 1,000 mcg by mouth daily.   guaiFENesin-dextromethorphan 100-10 MG/5ML syrup Commonly known as: ROBITUSSIN DM Take 5 mLs by mouth every 6 (six) hours as needed for cough.   hydroxychloroquine 200 MG tablet Commonly known as: PLAQUENIL TAKE 1 TABLET BY MOUTH DAILY FOR RHEUMATOID ARTHRITIS   irbesartan 300 MG tablet Commonly known as: AVAPRO TAKE 1 TABLET(300 MG) BY MOUTH DAILY   iron polysaccharides 150 MG capsule Commonly known as: NIFEREX Take 150 mg by mouth daily.   metoprolol succinate 25 MG 24 hr tablet Commonly known as: TOPROL-XL Take 1 tablet (25 mg total) by mouth daily. Start taking on: March 29, 2021   Prolia 60 MG/ML Sosy injection Generic drug: denosumab INJECT 60 MG INTO THE SKIN EVERY 6 (SIX) MONTHS.   Synthroid 88 MCG tablet Generic drug: levothyroxine TAKE 1 TABLET BY MOUTH EVERY DAY MONDAY TO SATURDAY AND OFF ON SUNDAYS   trolamine salicylate 10 % cream Commonly known as: ASPERCREME Apply 1 application topically daily as needed for muscle pain.   vitamin C 1000 MG tablet Take 1,000 mg by mouth daily.   VITAMIN D PO Take 2,000 Units by mouth daily.  Allergies  Allergen Reactions   Shellfish Allergy Anaphylaxis and Hives   Shellfish-Derived Products Anaphylaxis    Consultations: Cardiology    Procedures/Studies: DG Chest Portable 1 View  Result Date: 03/26/2021 CLINICAL DATA:  Cough and shortness of breath for 2 days, COVID-19 positivity EXAM: PORTABLE CHEST 1 VIEW COMPARISON:  09/24/2016 FINDINGS: Cardiac shadow is stable. Aortic calcifications are noted. Lungs are well aerated bilaterally. No focal infiltrate or effusion is seen. No bony abnormality is noted. IMPRESSION: No acute abnormality noted. Electronically Signed   By: Inez Catalina M.D.   On: 03/26/2021 16:23    ECHOCARDIOGRAM COMPLETE  Result Date: 03/27/2021    ECHOCARDIOGRAM REPORT   Patient Name:   Hannah [name] Date of Exam: 03/27/2021 Medical Rec #:  [id number]          Height:       58.0 in Accession #:    [id number]         Weight:       155.1 lb Date of Birth:  October 30, 1945         BSA:          1.635 m Patient Age:    53 years           BP:           132/60 mmHg Patient Gender: F                  HR:           54 bpm. Exam Location:  Inpatient Procedure: 2D Echo, Cardiac Doppler and Color Doppler Indications:    NSTEMI I21.4  History:        Patient has no prior history of Echocardiogram examinations,                 most recent 05/24/2018. Risk Factors:Hypertension, Diabetes and                 Dyslipidemia. COVID-19. Lupus.  Sonographer:    Jonelle Sidle Dance Referring Phys: [id number] [place]  1. Left ventricular ejection fraction, by estimation, is 55 to 60%. The left ventricle has normal function. The left ventricle has no regional wall motion abnormalities. There is mild asymmetric left ventricular hypertrophy of the basal-septal segment. Left ventricular diastolic parameters are consistent with Grade I diastolic dysfunction (impaired relaxation).  2. Right ventricular systolic function is normal. The right ventricular size is normal. There is normal pulmonary artery systolic pressure. The estimated right ventricular systolic pressure is 41.9 mmHg.  3. The mitral valve is grossly normal. Trivial mitral valve regurgitation. No evidence of mitral stenosis.  4. The aortic valve is tricuspid. There is mild calcification of the aortic valve. Aortic valve regurgitation is not visualized. Mild aortic valve sclerosis is present, with no evidence of aortic valve stenosis.  5. The inferior vena cava is normal in size with greater than 50% respiratory variability, suggesting right atrial pressure of 3 mmHg. Comparison(s): No significant change from prior study. FINDINGS  Left Ventricle: Left  ventricular ejection fraction, by estimation, is 55 to 60%. The left ventricle has normal function. The left ventricle has no regional wall motion abnormalities. The left ventricular internal cavity size was normal in size. There is  mild asymmetric left ventricular hypertrophy of the basal-septal segment. Left ventricular diastolic parameters are consistent with Grade I diastolic dysfunction (impaired relaxation). Right Ventricle: The right ventricular size is normal. No increase in right ventricular wall thickness. Right ventricular systolic function is normal.  There is normal pulmonary artery systolic pressure. The tricuspid regurgitant velocity is 2.46 m/s, and  with an assumed right atrial pressure of 3 mmHg, the estimated right ventricular systolic pressure is 23.3 mmHg. Left Atrium: Left atrial size was normal in size. Right Atrium: Right atrial size was normal in size. Pericardium: Trivial pericardial effusion is present. Mitral Valve: The mitral valve is grossly normal. Trivial mitral valve regurgitation. No evidence of mitral valve stenosis. Tricuspid Valve: The tricuspid valve is grossly normal. Tricuspid valve regurgitation is trivial. No evidence of tricuspid stenosis. Aortic Valve: The aortic valve is tricuspid. There is mild calcification of the aortic valve. Aortic valve regurgitation is not visualized. Mild aortic valve sclerosis is present, with no evidence of aortic valve stenosis. Pulmonic Valve: The pulmonic valve was grossly normal. Pulmonic valve regurgitation is trivial. No evidence of pulmonic stenosis. Aorta: The aortic root and ascending aorta are structurally normal, with no evidence of dilitation. Venous: The inferior vena cava is normal in size with greater than 50% respiratory variability, suggesting right atrial pressure of 3 mmHg. IAS/Shunts: The atrial septum is grossly normal.  LEFT VENTRICLE PLAX 2D LVIDd:         4.00 cm  Diastology LVIDs:         2.80 cm  LV e' medial:   6.64  cm/s LV PW:         1.10 cm  LV E/e' medial: 7.9 LV IVS:        1.10 cm LVOT diam:     2.00 cm LV SV:         80 LV SV Index:   49 LVOT Area:     3.14 cm  RIGHT VENTRICLE          IVC RV Basal diam:  2.90 cm  IVC diam: 1.20 cm TAPSE (M-mode): 1.8 cm LEFT ATRIUM             Index       RIGHT ATRIUM           Index LA diam:        3.80 cm 2.32 cm/m  RA Area:     15.00 cm LA Vol (A2C):   48.4 ml 29.61 ml/m RA Volume:   40.00 ml  24.47 ml/m LA Vol (A4C):   31.6 ml 19.33 ml/m LA Biplane Vol: 39.0 ml 23.86 ml/m  AORTIC VALVE LVOT Vmax:   104.00 cm/s LVOT Vmean:  77.000 cm/s LVOT VTI:    0.254 m  AORTA Ao Root diam: 3.10 cm Ao Asc diam:  2.90 cm MITRAL VALVE               TRICUSPID VALVE MV Area (PHT): 2.26 cm    TR Peak grad:   24.2 mmHg MV Decel Time: 335 msec    TR Vmax:        246.00 cm/s MV E velocity: 52.30 cm/s MV A velocity: 60.00 cm/s  SHUNTS MV E/A ratio:  0.87        Systemic VTI:  0.25 m                            Systemic Diam: 2.00 cm Eleonore Chiquito MD Electronically signed by Eleonore Chiquito MD Signature Date/Time: 03/27/2021/6:27:17 PM    Final       Subjective: Patient is feeling better, no nausea or vomiting, no chest pain, no nausea or vomiting.   Discharge Exam: Vitals:   03/28/21  0900 03/28/21 0906  BP:  108/65  Pulse: 64 66  Resp: (!) 23   Temp:    SpO2: 100% 99%   Vitals:   03/28/21 0748 03/28/21 0800 03/28/21 0900 03/28/21 0906  BP: 116/65   108/65  Pulse: (!) 54 60 64 66  Resp: 18 15 (!) 23   Temp: 98.6 F (37 C)     TempSrc: Oral     SpO2: 97% 98% 100% 99%  Weight:      Height:        General: Not in pain or dyspnea  Neurology: Awake and alert, non focal  E ENT: no pallor, no icterus, oral mucosa moist Cardiovascular: No JVD. S1-S2 present, rhythmic, no gallops, rubs, or murmurs. No lower extremity edema. Pulmonary: positive breath sounds bilaterally, with no wheezing, rhonchi or rales. Gastrointestinal. Abdomen soft and non tender Skin. No  rashes Musculoskeletal: no joint deformities   The results of significant diagnostics from this hospitalization (including imaging, microbiology, ancillary and laboratory) are listed below for reference.     Microbiology: Recent Results (from the past 240 hour(s))  Resp Panel by RT-PCR (Flu A&B, Covid) Nasopharyngeal Swab     Status: Abnormal   Collection Time: 03/26/21  8:38 PM   Specimen: Nasopharyngeal Swab; Nasopharyngeal(NP) swabs in vial transport medium  Result Value Ref Range Status   SARS Coronavirus 2 by RT PCR POSITIVE (A) NEGATIVE Final    Comment: RESULT CALLED TO, READ BACK BY AND VERIFIED WITH: Gwen Her RN 03/26/21 2156 JDW (NOTE) SARS-CoV-2 target nucleic acids are DETECTED.  The SARS-CoV-2 RNA is generally detectable in upper respiratory specimens during the acute phase of infection. Positive results are indicative of the presence of the identified virus, but do not rule out bacterial infection or co-infection with other pathogens not detected by the test. Clinical correlation with patient history and other diagnostic information is necessary to determine patient infection status. The expected result is Negative.  Fact Sheet for Patients: EntrepreneurPulse.com.au  Fact Sheet for Healthcare Providers: IncredibleEmployment.be  This test is not yet approved or cleared by the [place] FDA and  has been authorized for detection and/or diagnosis of SARS-CoV-2 by FDA under an Emergency Use Authorization (EUA).  This EUA will remain in effect (meaning this test can be used ) for the duration of  the COVID-19 declaration under Section 564(b)(1) of the Act, 21 U.S.C. section 360bbb-3(b)(1), unless the authorization is terminated or revoked sooner.     Influenza A by PCR NEGATIVE NEGATIVE Final   Influenza B by PCR NEGATIVE NEGATIVE Final    Comment: (NOTE) The Xpert Xpress SARS-CoV-2/FLU/RSV plus assay is intended as an  aid in the diagnosis of influenza from Nasopharyngeal swab specimens and should not be used as a sole basis for treatment. Nasal washings and aspirates are unacceptable for Xpert Xpress SARS-CoV-2/FLU/RSV testing.  Fact Sheet for Patients: EntrepreneurPulse.com.au  Fact Sheet for Healthcare Providers: IncredibleEmployment.be  This test is not yet approved or cleared by the [place] FDA and has been authorized for detection and/or diagnosis of SARS-CoV-2 by FDA under an Emergency Use Authorization (EUA). This EUA will remain in effect (meaning this test can be used) for the duration of the COVID-19 declaration under Section 564(b)(1) of the Act, 21 U.S.C. section 360bbb-3(b)(1), unless the authorization is terminated or revoked.  Performed at [facility], [place] [address], [place], [place] [address]      Labs: BNP (last 3 results) No results for input(s): BNP in the  last 8760 hours. Basic Metabolic Panel: Recent Labs  Lab 03/26/21 1436 03/27/21 0519 03/28/21 0344  NA 137 136 138  K 4.5 4.2 4.2  CL 102 102 106  CO2 24 25 26   GLUCOSE 98 84 82  BUN 23 26* 25*  CREATININE 1.57* 1.34* 1.24*  CALCIUM 9.3 8.5* 8.2*  MG 1.8  --   --    Liver Function Tests: Recent Labs  Lab 03/26/21 1436 03/27/21 0519  AST 26 31  ALT 17 18  ALKPHOS 40 36*  BILITOT 0.7 0.4  PROT 6.5 5.7*  ALBUMIN 3.6 3.0*   No results for input(s): LIPASE, AMYLASE in the last 168 hours. No results for input(s): AMMONIA in the last 168 hours. CBC: Recent Labs  Lab 03/26/21 1436 03/27/21 0519 03/28/21 0344  WBC 6.2 3.7* 3.2*  NEUTROABS 4.7  --   --   HGB 11.9* 10.2* 9.9*  HCT 36.3 31.2* 30.9*  MCV 98.6 96.6 96.6  PLT 121* 89* 105*   Cardiac Enzymes: No results for input(s): CKTOTAL, CKMB, CKMBINDEX, TROPONINI in the last 168 hours. BNP: Invalid input(s): POCBNP CBG: Recent Labs  Lab 03/26/21 2148 03/27/21 1213 03/27/21 1658 03/27/21 2205  03/28/21 0625  GLUCAP 73 95 120* 95 87   D-Dimer No results for input(s): DDIMER in the last 72 hours. Hgb A1c No results for input(s): HGBA1C in the last 72 hours. Lipid Profile No results for input(s): CHOL, HDL, LDLCALC, TRIG, CHOLHDL, LDLDIRECT in the last 72 hours. Thyroid function studies Recent Labs    03/26/21 1436  TSH 0.690   Anemia work up No results for input(s): VITAMINB12, FOLATE, FERRITIN, TIBC, IRON, RETICCTPCT in the last 72 hours. Urinalysis    Component Value Date/Time   COLORURINE YELLOW 08/14/2020 1546   APPEARANCEUR CLEAR 08/14/2020 1546   LABSPEC 1.006 08/14/2020 1546   PHURINE 6.5 08/14/2020 1546   GLUCOSEU NEGATIVE 08/14/2020 1546   HGBUR NEGATIVE 08/14/2020 1546   BILIRUBINUR Negative 12/30/2020 1301   KETONESUR NEGATIVE 08/14/2020 1546   PROTEINUR Positive (A) 12/30/2020 1301   PROTEINUR NEGATIVE 08/14/2020 1546   UROBILINOGEN 0.2 12/30/2020 1301   NITRITE Negative 12/30/2020 1301   NITRITE NEGATIVE 08/14/2020 1546   LEUKOCYTESUR Trace (A) 12/30/2020 1301   LEUKOCYTESUR 1+ (A) 08/14/2020 1546   Sepsis Labs Invalid input(s): PROCALCITONIN,  WBC,  LACTICIDVEN Microbiology Recent Results (from the past 240 hour(s))  Resp Panel by RT-PCR (Flu A&B, Covid) Nasopharyngeal Swab     Status: Abnormal   Collection Time: 03/26/21  8:38 PM   Specimen: Nasopharyngeal Swab; Nasopharyngeal(NP) swabs in vial transport medium  Result Value Ref Range Status   SARS Coronavirus 2 by RT PCR POSITIVE (A) NEGATIVE Final    Comment: RESULT CALLED TO, READ BACK BY AND VERIFIED WITH: Gwen Her RN 03/26/21 2156 JDW (NOTE) SARS-CoV-2 target nucleic acids are DETECTED.  The SARS-CoV-2 RNA is generally detectable in upper respiratory specimens during the acute phase of infection. Positive results are indicative of the presence of the identified virus, but do not rule out bacterial infection or co-infection with other pathogens not detected by the test. Clinical  correlation with patient history and other diagnostic information is necessary to determine patient infection status. The expected result is Negative.  Fact Sheet for Patients: EntrepreneurPulse.com.au  Fact Sheet for Healthcare Providers: IncredibleEmployment.be  This test is not yet approved or cleared by the [place] FDA and  has been authorized for detection and/or diagnosis of SARS-CoV-2 by FDA under an Emergency Use Authorization (EUA).  This EUA will remain in effect (meaning this test can be used ) for the duration of  the COVID-19 declaration under Section 564(b)(1) of the Act, 21 U.S.C. section 360bbb-3(b)(1), unless the authorization is terminated or revoked sooner.     Influenza A by PCR NEGATIVE NEGATIVE Final   Influenza B by PCR NEGATIVE NEGATIVE Final    Comment: (NOTE) The Xpert Xpress SARS-CoV-2/FLU/RSV plus assay is intended as an aid in the diagnosis of influenza from Nasopharyngeal swab specimens and should not be used as a sole basis for treatment. Nasal washings and aspirates are unacceptable for Xpert Xpress SARS-CoV-2/FLU/RSV testing.  Fact Sheet for Patients: EntrepreneurPulse.com.au  Fact Sheet for Healthcare Providers: IncredibleEmployment.be  This test is not yet approved or cleared by the [place] FDA and has been authorized for detection and/or diagnosis of SARS-CoV-2 by FDA under an Emergency Use Authorization (EUA). This EUA will remain in effect (meaning this test can be used) for the duration of the COVID-19 declaration under Section 564(b)(1) of the Act, 21 U.S.C. section 360bbb-3(b)(1), unless the authorization is terminated or revoked.  Performed at [facility], [place] [address], [place], [place] [address]      Time coordinating discharge: 45 minutes  SIGNED:   Tawni Millers, MD  [facility] 03/28/2021, 10:42 AM

## 2021-04-01 ENCOUNTER — Telehealth: Payer: Self-pay

## 2021-04-01 NOTE — Telephone Encounter (Signed)
Transition Care Management Follow-up Telephone Call Date of discharge and from where: 03/26/2021 [facility] How have you been since you were released from the hospital? Feeling ok just a deep cough.  Any questions or concerns? Yes  Items Reviewed: Did the pt receive and understand the discharge instructions provided? Yes  the pt said she is confused does she quentin for 5 or 10 days Medications obtained and verified? Yes  Other? No  Any new allergies since your discharge? No  Dietary orders reviewed? Yes Do you have support at home? No   Home Care and Equipment/Supplies: Were home health services ordered? no If so, what is the name of the agency?   Has the agency set up a time to come to the patient's home? not applicable Were any new equipment or medical supplies ordered?  No What is the name of the medical supply agency?  Were you able to get the supplies/equipment? not applicable Do you have any questions related to the use of the equipment or supplies? No  Functional Questionnaire: (I = Independent and D = Dependent) ADLs: I  Bathing/Dressing- I  Meal Prep- I  Eating- I  Maintaining continence- I  Transferring/Ambulation- I  Managing Meds- I  Follow up appointments reviewed:  PCP Hospital f/u appt confirmed? Yes  Scheduled to see Dr. Baird Cancer on 01/07/2021 @ 4:15. [place] [facility] f/u appt confirmed? N/a Are transportation arrangements needed? No  If their condition worsens, is the pt aware to call PCP or go to the Emergency Dept.? Yes Was the patient provided with contact information for the PCP's office or ED? Yes Was to pt encouraged to call back with questions or concerns? Yes

## 2021-04-03 ENCOUNTER — Telehealth: Payer: Medicare HMO

## 2021-04-03 ENCOUNTER — Ambulatory Visit (INDEPENDENT_AMBULATORY_CARE_PROVIDER_SITE_OTHER): Payer: Medicare HMO

## 2021-04-03 DIAGNOSIS — E1122 Type 2 diabetes mellitus with diabetic chronic kidney disease: Secondary | ICD-10-CM

## 2021-04-03 DIAGNOSIS — D508 Other iron deficiency anemias: Secondary | ICD-10-CM

## 2021-04-03 DIAGNOSIS — N1831 Chronic kidney disease, stage 3a: Secondary | ICD-10-CM

## 2021-04-03 DIAGNOSIS — M0579 Rheumatoid arthritis with rheumatoid factor of multiple sites without organ or systems involvement: Secondary | ICD-10-CM | POA: Diagnosis not present

## 2021-04-03 DIAGNOSIS — N183 Chronic kidney disease, stage 3 unspecified: Secondary | ICD-10-CM

## 2021-04-03 DIAGNOSIS — I4891 Unspecified atrial fibrillation: Secondary | ICD-10-CM | POA: Diagnosis not present

## 2021-04-03 DIAGNOSIS — I129 Hypertensive chronic kidney disease with stage 1 through stage 4 chronic kidney disease, or unspecified chronic kidney disease: Secondary | ICD-10-CM | POA: Diagnosis not present

## 2021-04-04 ENCOUNTER — Other Ambulatory Visit: Payer: Self-pay | Admitting: Internal Medicine

## 2021-04-08 ENCOUNTER — Encounter: Payer: Self-pay | Admitting: Internal Medicine

## 2021-04-08 ENCOUNTER — Other Ambulatory Visit (HOSPITAL_COMMUNITY): Payer: Self-pay

## 2021-04-08 ENCOUNTER — Telehealth (HOSPITAL_COMMUNITY): Payer: Self-pay

## 2021-04-08 ENCOUNTER — Telehealth: Payer: Self-pay

## 2021-04-08 ENCOUNTER — Ambulatory Visit (INDEPENDENT_AMBULATORY_CARE_PROVIDER_SITE_OTHER): Payer: Medicare HMO | Admitting: Internal Medicine

## 2021-04-08 ENCOUNTER — Other Ambulatory Visit: Payer: Self-pay

## 2021-04-08 VITALS — BP 122/78 | HR 72 | Temp 98.5°F | Ht <= 58 in | Wt 154.0 lb

## 2021-04-08 DIAGNOSIS — I4891 Unspecified atrial fibrillation: Secondary | ICD-10-CM

## 2021-04-08 DIAGNOSIS — Z8616 Personal history of COVID-19: Secondary | ICD-10-CM

## 2021-04-08 DIAGNOSIS — I129 Hypertensive chronic kidney disease with stage 1 through stage 4 chronic kidney disease, or unspecified chronic kidney disease: Secondary | ICD-10-CM | POA: Diagnosis not present

## 2021-04-08 DIAGNOSIS — Z6833 Body mass index (BMI) 33.0-33.9, adult: Secondary | ICD-10-CM

## 2021-04-08 DIAGNOSIS — I480 Paroxysmal atrial fibrillation: Secondary | ICD-10-CM

## 2021-04-08 DIAGNOSIS — N1831 Chronic kidney disease, stage 3a: Secondary | ICD-10-CM

## 2021-04-08 DIAGNOSIS — E6609 Other obesity due to excess calories: Secondary | ICD-10-CM

## 2021-04-08 DIAGNOSIS — D696 Thrombocytopenia, unspecified: Secondary | ICD-10-CM | POA: Diagnosis not present

## 2021-04-08 DIAGNOSIS — E661 Drug-induced obesity: Secondary | ICD-10-CM

## 2021-04-08 DIAGNOSIS — N183 Chronic kidney disease, stage 3 unspecified: Secondary | ICD-10-CM

## 2021-04-08 MED ORDER — APIXABAN 5 MG PO TABS: 5.0000 mg | ORAL_TABLET | Freq: Two times a day (BID) | ORAL | 0 refills | Status: DC

## 2021-04-08 NOTE — Patient Instructions (Signed)
Atrial Fibrillation  Atrial fibrillation is a type of heartbeat that is irregular or fast. If you have this condition, your heart beats without any order. This makes it hard foryour heart to pump blood in a normal way. Atrial fibrillation may come and go, or it may become a long-lasting problem. If this condition is not treated, it can put you at higher risk for stroke,heart failure, and other heart problems. What are the causes? This condition may be caused by diseases that damage the heart. They include: High blood pressure. Heart failure. Heart valve disease. Heart surgery. Other causes include: Diabetes. Thyroid disease. Being overweight. Kidney disease. Sometimes the cause is not known. What increases the risk? You are more likely to develop this condition if: You are older. You smoke. You exercise often and very hard. You have a family history of this condition. You are a man. You use drugs. You drink a lot of alcohol. You have lung conditions, such as emphysema, pneumonia, or COPD. You have sleep apnea. What are the signs or symptoms? Common symptoms of this condition include: A feeling that your heart is beating very fast. Chest pain or discomfort. Feeling short of breath. Suddenly feeling light-headed or weak. Getting tired easily during activity. Fainting. Sweating. In some cases, there are no symptoms. How is this treated? Treatment for this condition depends on underlying conditions and how you feel when you have atrial fibrillation. They include: Medicines to: Prevent blood clots. Treat heart rate or heart rhythm problems. Using devices, such as a pacemaker, to correct heart rhythm problems. Doing surgery to remove the part of the heart that sends bad signals. Closing an area where clots can form in the heart (left atrial appendage). In some cases, your doctor will treat other underlying conditions. Follow these instructions at home: Medicines Take  over-the-counter and prescription medicines only as told by your doctor. Do not take any new medicines without first talking to your doctor. If you are taking blood thinners: Talk with your doctor before you take any medicines that have aspirin or NSAIDs, such as ibuprofen, in them. Take your medicine exactly as told by your doctor. Take it at the same time each day. Avoid activities that could hurt or bruise you. Follow instructions about how to prevent falls. Wear a bracelet that says you are taking blood thinners. Or, carry a card that lists what medicines you take. Lifestyle     Do not use any products that have nicotine or tobacco in them. These include cigarettes, e-cigarettes, and chewing tobacco. If you need help quitting, ask your doctor. Eat heart-healthy foods. Talk with your doctor about the right eating plan for you. Exercise regularly as told by your doctor. Do not drink alcohol. Lose weight if you are overweight. Do not use drugs, including cannabis. General instructions If you have a condition that causes breathing to stop for a short period of time (apnea), treat it as told by your doctor. Keep a healthy weight. Do not use diet pills unless your doctor says they are safe for you. Diet pills may make heart problems worse. Keep all follow-up visits as told by your doctor. This is important. Contact a doctor if: You notice a change in the speed, rhythm, or strength of your heartbeat. You are taking a blood-thinning medicine and you get more bruising. You get tired more easily when you move or exercise. You have a sudden change in weight. Get help right away if:  You have pain in your chest  or your belly (abdomen). You have trouble breathing. You have side effects of blood thinners, such as blood in your vomit, poop (stool), or pee (urine), or bleeding that cannot stop. You have any signs of a stroke. "BE FAST" is an easy way to remember the main warning signs: B -  Balance. Signs are dizziness, sudden trouble walking, or loss of balance. E - Eyes. Signs are trouble seeing or a change in how you see. F - Face. Signs are sudden weakness or loss of feeling in the face, or the face or eyelid drooping on one side. A - Arms. Signs are weakness or loss of feeling in an arm. This happens suddenly and usually on one side of the body. S - Speech. Signs are sudden trouble speaking, slurred speech, or trouble understanding what people say. T - Time. Time to call emergency services. Write down what time symptoms started. You have other signs of a stroke, such as: A sudden, very bad headache with no known cause. Feeling like you may vomit (nausea). Vomiting. A seizure. These symptoms may be an emergency. Do not wait to see if the symptoms will go away. Get medical help right away. Call your local emergency services (911 in the [place] Do not drive yourself to the hospital. Summary Atrial fibrillation is a type of heartbeat that is irregular or fast. You are at higher risk of this condition if you smoke, are older, have diabetes, or are overweight. Follow your doctor's instructions about medicines, diet, exercise, and follow-up visits. Get help right away if you have signs or symptoms of a stroke. Get help right away if you cannot catch your breath, or you have chest pain or discomfort. This information is not intended to replace advice given to you by your health care provider. Make sure you discuss any questions you have with your healthcare provider. Document Revised: 03/08/2019 Document Reviewed: 03/08/2019 Elsevier Patient Education  2022 [employer]. Hematoma A hematoma is a collection of blood. A hematoma can happen: Under the skin. In an organ. In a body space. In a joint space. In other tissues. The blood can thicken (clot) to form a lump that you can see and feel. The lump is often hard and may become sore and tender. The lump can be very small or very  big. Most hematomas get better in a few days to weeks. However, some hematomas may be serious andneed medical care. What are the causes? This condition is caused by: An injury. Blood that leaks under the skin. Problems from surgeries. Medical conditions that cause bleeding or bruising. What increases the risk? You are more likely to develop this condition if: You are an older adult. You use medicines that thin your blood. What are the signs or symptoms? Symptoms depend on where the hematoma is in your body. If the hematoma is under the skin, there is: A firm lump on the body. Pain and tenderness in the area. Bruising. The skin above the lump may be blue, dark blue, purple-red, or yellowish. If the hematoma is deep in the tissues or body spaces, there may be: Blood in the stomach. This may cause pain in the belly (abdomen), weakness, passing out (fainting), and shortness of breath. Blood in the head. This may cause a headache, weakness, trouble speaking or understanding speech, or passing out. How is this diagnosed? This condition is diagnosed based on: Your medical history. A physical exam. Imaging tests, such as ultrasound or CT scan. Blood tests. How is  this treated? Treatment depends on the cause, size, and location of the hematoma. Treatment may include: Doing nothing. Many hematomas go away on their own without treatment. Surgery or close monitoring. This may be needed for large hematomas or hematomas that affect the body's organs. Medicines. These may be given if a medical condition caused the hematoma. Follow these instructions at home: Managing pain, stiffness, and swelling  If told, put ice on the area. Put ice in a plastic bag. Place a towel between your skin and the bag. Leave the ice on for 20 minutes, 2-3 times a day for the first two days. If told, put heat on the affected area after putting ice on the area for two days. Use the heat source that your doctor tells  you to use. This could be a moist heat pack or a heating pad. To do this: Place a towel between your skin and the heat source. Leave the heat on for 20-30 minutes. Remove the heat if your skin turns bright red. This is very important if you are unable to feel pain, heat, or cold. You may have a greater risk of getting burned. Raise (elevate) the affected area above the level of your heart while you are sitting or lying down. Wrap the affected area with an elastic bandage, if told by your doctor. Do not wrap the bandage too tightly. If your hematoma is on a leg or foot and is painful, your doctor may give you crutches. Use them as told by your doctor.  General instructions Take over-the-counter and prescription medicines only as told by your doctor. Keep all follow-up visits as told by your doctor. This is important. Contact a doctor if: You have a fever. The swelling or bruising gets worse. You start to get more hematomas. Get help right away if: Your pain gets worse. Your pain is not getting better with medicine. Your skin over the hematoma breaks or starts to bleed. Your hematoma is in your chest or belly and you: Pass out. Feel weak. Become short of breath. You have a hematoma on your scalp that is caused by a fall or injury, and you: Have a headache that gets worse. Have trouble speaking or understanding speech. Become less alert or you pass out. Summary A hematoma is a collection of blood in any part of your body. Most hematomas get better on their own in a few days to weeks. Some may need medical care. Follow instructions from your doctor about how to care for your hematoma. Contact a doctor if the swelling or bruising gets worse, or if you are short of breath. This information is not intended to replace advice given to you by your health care provider. Make sure you discuss any questions you have with your healthcare provider. Document Revised: 02/17/2018 Document Reviewed:  02/17/2018 Elsevier Patient Education  2022 [employer].

## 2021-04-08 NOTE — Chronic Care Management (AMB) (Signed)
Patient called in reference to medications that were recently started while admitted in the ER Metoprolol and Eliquis. Patient states the copay for eliquis is around 400.00 and is unsure if the copay for metoprolol. Patient has a hospital follow up this afternoon with Dr. Baird Cancer and was instructed to see if she will continue medications before initiating a PAP.  [place] Pharmacist Assistant [phone]

## 2021-04-08 NOTE — Progress Notes (Signed)
I,Tianna Badgett,acting as a Education administrator for [name], MD.,have documented all relevant documentation on the behalf of [name], MD,as directed by  [name], MD while in the presence of [name], MD.  This visit occurred during the SARS-CoV-2 public health emergency.  Safety protocols were in place, including screening questions prior to the visit, additional usage of staff PPE, and extensive cleaning of exam room while observing appropriate contact time as indicated for disinfecting solutions.  Subjective:     Patient ID: Hannah [name] , female    DOB: 1945-10-19 , 75 y.o.   MRN: [id number]   Chief Complaint  Patient presents with   Hospitalization Follow-up    HPI  Patient is here for hospital follow up. She states she started not feeling well on 6/28 - she developed brain fog, did not have temp, she took a home COVID test which was negative. She went to [facility] and found out she was POS. She immediately felt lightheaded after finding out, EMS was called and she was taken to [facility]. While in ambulance, she was found to be in atrial fibrillation. Prior to reaching the hospital, she converted back to sinus rhythm. At that point, she was admitted for further evaluation.  Her hospital course was pretty uneventful. Patient placed on metoprolol XL for rate control and anticoagulation with apixaban. Since discharge, she denies having any palpitations. She denies having chest pain and shortness of breath as well. She has had a persistent cough, which has almost resolved completely.   Admitted 6/29 Discharged 7/1    Past Medical History:  Diagnosis Date   Anemia    Arthritis    Asthma    Diabetes mellitus without complication ([place])    Gout 12/17/2014   patient reported   Hyperlipidemia    Hypertension    Systemic lupus erythematosus ([place])    Vitiligo      Family History  Problem Relation Age of Onset   Heart disease Father    Diabetes Father    Hypertension  Mother    Hypertension Sister    Hypertension Sister    Leukemia Sister      Current Outpatient Medications:    Accu-Chek Softclix Lancets lancets, Use to check blood sugars daily E11.69, Disp: 100 each, Rfl: 2   albuterol (PROVENTIL HFA;VENTOLIN HFA) 108 (90 Base) MCG/ACT inhaler, Inhale 2 puffs into the lungs every 6 (six) hours as needed for wheezing or shortness of breath., Disp: 1 Inhaler, Rfl: 2   Alcohol Swabs (ALCOHOL PADS) 70 % PADS, Use as directed to check blood sugars 1 time per day dx: e11.22, Disp: 150 each, Rfl: 2   allopurinol (ZYLOPRIM) 100 MG tablet, TAKE 1 TABLET BY MOUTH EVERY DAY FOR GOUT, Disp: 90 tablet, Rfl: 1   amLODipine (NORVASC) 2.5 MG tablet, TAKE 1 TABLET(2.5 MG) BY MOUTH DAILY, Disp: 30 tablet, Rfl: 11   Ascorbic Acid (VITAMIN C) 1000 MG tablet, Take 1,000 mg by mouth daily., Disp: , Rfl:    budesonide-formoterol (SYMBICORT) 160-4.5 MCG/ACT inhaler, INHALE 2 PUFFS BY MOUTH TWICE DAILY IN THE MORNING AND EVENING, Disp: 10.2 g, Rfl: 2   Calcium Carb-Cholecalciferol 600-800 MG-UNIT TABS, Take 1 tablet by mouth daily. , Disp: , Rfl:    Cholecalciferol (VITAMIN D PO), Take 2,000 Units by mouth daily., Disp: , Rfl:    cyanocobalamin 1000 MCG tablet, Take 1,000 mcg by mouth daily., Disp: , Rfl:    glucose blood (ACCU-CHEK GUIDE) test strip, Use to check  blood sugars daily E11.69, Disp: 100 each, Rfl: 2   hydroxychloroquine (PLAQUENIL) 200 MG tablet, TAKE 1 TABLET BY MOUTH DAILY FOR RHEUMATOID ARTHRITIS, Disp: 90 tablet, Rfl: 0   Hypromellose (ARTIFICIAL TEARS OP), Apply 1 drop to eye daily as needed (dry eyes)., Disp: , Rfl:    irbesartan (AVAPRO) 300 MG tablet, TAKE 1 TABLET(300 MG) BY MOUTH DAILY, Disp: 90 tablet, Rfl: 2   iron polysaccharides (NIFEREX) 150 MG capsule, Take 150 mg by mouth daily., Disp: , Rfl:    Liniments (BEN GAY EX), Apply 1 application topically daily as needed (pain)., Disp: , Rfl:    SYNTHROID 88 MCG tablet, TAKE 1 TABLET BY MOUTH EVERY DAY  MONDAY TO SATURDAY AND OFF ON SUNDAYS, Disp: 90 tablet, Rfl: 0   trolamine salicylate (ASPERCREME) 10 % cream, Apply 1 application topically daily as needed for muscle pain., Disp: , Rfl:    apixaban (ELIQUIS) 5 MG TABS tablet, Take 1 tablet (5 mg total) by mouth 2 (two) times daily., Disp: 180 tablet, Rfl: 3   atorvastatin (LIPITOR) 80 MG tablet, TAKE 1 TABLET BY MOUTH EVERY DAY, Disp: 90 tablet, Rfl: 1   denosumab (PROLIA) 60 MG/ML SOSY injection, INJECT 60 MG INTO THE SKIN EVERY 6 (SIX) MONTHS., Disp: 1 mL, Rfl: 0   metoprolol succinate (TOPROL-XL) 25 MG 24 hr tablet, Take 1 tablet (25 mg total) by mouth daily., Disp: 90 tablet, Rfl: 3   Allergies  Allergen Reactions   Shellfish Allergy Anaphylaxis and Hives   Shellfish-Derived Products Anaphylaxis     Review of Systems  Constitutional: Negative.   Respiratory:  Positive for cough.   Cardiovascular: Negative.   Gastrointestinal: Negative.   Neurological: Negative.   Psychiatric/Behavioral: Negative.      Today's Vitals   04/08/21 1551  BP: 122/78  Pulse: 72  Temp: 98.5 F (36.9 C)  TempSrc: Oral  Weight: 154 lb (69.9 kg)  Height: 4' 9.2" (1.453 m)   Body mass index is 33.09 kg/m.  Wt Readings from Last 3 Encounters:  04/28/21 160 lb (72.6 kg)  04/18/21 157 lb 2 oz (71.3 kg)  04/16/21 158 lb (71.7 kg)    Objective:  Physical Exam Vitals and nursing note reviewed.  Constitutional:      Appearance: Normal appearance.  HENT:     Head: Normocephalic and atraumatic.     Nose:     Comments: Masked     Mouth/Throat:     Comments: Masked  Cardiovascular:     Rate and Rhythm: Normal rate and regular rhythm.     Heart sounds: Normal heart sounds.  Pulmonary:     Effort: Pulmonary effort is normal.     Breath sounds: Normal breath sounds.  Musculoskeletal:     Cervical back: Normal range of motion.  Skin:    General: Skin is warm.  Neurological:     General: No focal deficit present.     Mental Status: She is  alert.  Psychiatric:        Mood and Affect: Mood normal.        Behavior: Behavior normal.        Assessment And Plan:     1. Paroxysmal atrial fibrillation (HCC) TCM PERFORMED. A MEMBER OF THE CLINICAL TEAM SPOKE WITH THE PATIENT UPON DISCHARGE. DISCHARGE SUMMARY WAS REVIEWED IN FULL DETAIL DURING THE VISIT. MEDS RECONCILED AND COMPARED TO DISCHARGE MEDS. MEDICATION LIST WAS UPDATED AND REVIEWED WITH THE PATIENT. GREATER THAN 50% FACE TO FACE TIME WAS SPENT IN  COUNSELING AND COORDINATION OF CARE. ALL QUESTIONS WERE ANSWERED TO THE SATISFACTION OF THE PATIENT. She is encouraged to take meds as prescribed. She should look out for new bruising since she is now on a blood-thinner. Encouraged to keep upcoming Cardiology appointment. Please go to ER should she develop chest pain, palpitations and/or SOB.   2. Hypertensive nephropathy Comments: Chronic, well controlled. She is encouraged to follow low sodium diet.  - BMP8+eGFR  3. Stage 3a chronic kidney disease (HCC) Comments: Chronic, also followed by Nephrology. She is encouraged to stay well hydrated.  4. Thrombocytopenia ([place]) Comments: I will check CBC w/ platelet count today. Pt advised platelet count less than 100 will warrant Hematology evaluation.  - CBC with Diff  5. Class 1 obesity due to excess calories with serious comorbidity and body mass index (BMI) of 33.0 to 33.9 in adult Comments: She is encouraged to strive for BMI less than 30 to decrease cardiac risk. Advised to aim for at least 150 minutes of exercise per week.  6. Personal history of COVID-19 Comments: She is fully vaccinated and boosted.  Thankfully, there was no sign of viral pneumonia on CXR.   Patient was given opportunity to ask questions. Patient verbalized understanding of the plan and was able to repeat key elements of the plan. All questions were answered to their satisfaction.   I, [name], MD, have reviewed all documentation for this visit. The  documentation on 04/26/21 for the exam, diagnosis, procedures, and orders are all accurate and complete.   IF YOU HAVE BEEN REFERRED TO A SPECIALIST, IT MAY TAKE 1-2 WEEKS TO SCHEDULE/PROCESS THE REFERRAL. IF YOU HAVE NOT HEARD FROM US/SPECIALIST IN TWO WEEKS, PLEASE GIVE [place] A CALL AT [phone].   THE PATIENT IS ENCOURAGED TO PRACTICE SOCIAL DISTANCING DUE TO THE COVID-19 PANDEMIC.

## 2021-04-08 NOTE — Telephone Encounter (Signed)
Pharmacy Transitions of Care Follow-up Telephone Call  Date of discharge: 03/28/21  Discharge Diagnosis: Tachyarrhythmia and COVID19  How have you been since you were released from the hospital?  Patient doing well, not coughing nearly as much as when she was in the hospital. No fever or sore throat. Patient has read her discharge material multiple times.  Medication changes made at discharge:     START taking: Cough/Chest Congestion DM (Dextromethorphan-guaiFENesin)  Eliquis (apixaban)  metoprolol succinate (TOPROL-XL)   CHANGE how you take: amLODipine (NORVASC)  budesonide-formoterol (Symbicort)   STOP taking: tacrolimus 0.1 % ointment (PROTOPIC)  True Metrix Level 1 Low Soln   Medication changes verified by the patient? Yes    Medication Accessibility:  Home Pharmacy:  [facility]  Was the patient provided with refills on discharged medications? No   Have all prescriptions been transferred from [facility] to home pharmacy? N/A   Is the patient able to afford medications? Patient has [employer] Gold Plus    Medication Review:  APIXABAN (ELIQUIS)  Apixaban 5 mg BID initiated on 03/28/21.  - Discussed importance of taking medication around the same time everyday  - Advised patient of medications to avoid (NSAIDs, ASA)  - Educated that Tylenol (acetaminophen) will be the preferred analgesic to prevent risk of bleeding  - Emphasized importance of monitoring for signs and symptoms of bleeding (abnormal bruising, prolonged bleeding, nose bleeds, bleeding from gums, discolored urine, black tarry stools)  - Advised patient to alert all providers of anticoagulation therapy prior to starting a new medication or having a procedure    Follow-up Appointments:  PCP Hospital f/u appt confirmed? Yes Scheduled to see Dr. Baird Cancer on 06/25/21 @ IM. Also a follow up 04/08/21  Specialist Hospital f/u appt confirmed? Yes Scheduled to see Dr. Acie Fredrickson on  04/16/21 @ Cardiology.   If  their condition worsens, is the pt aware to call PCP or go to the Emergency Dept.? Yes  Final Patient Assessment: Patient doing well, has follow ups scheduled and knows to get refills at follow up.

## 2021-04-09 ENCOUNTER — Other Ambulatory Visit: Payer: Self-pay | Admitting: Internal Medicine

## 2021-04-09 ENCOUNTER — Ambulatory Visit: Payer: Medicare HMO | Admitting: Cardiovascular Disease

## 2021-04-09 ENCOUNTER — Other Ambulatory Visit (HOSPITAL_COMMUNITY): Payer: Self-pay

## 2021-04-09 LAB — BMP8+EGFR
BUN/Creatinine Ratio: 12 (ref 12–28)
BUN: 15 mg/dL (ref 8–27)
CO2: 20 mmol/L (ref 20–29)
Calcium: 9.7 mg/dL (ref 8.7–10.3)
Chloride: 103 mmol/L (ref 96–106)
Creatinine, Ser: 1.21 mg/dL — ABNORMAL HIGH (ref 0.57–1.00)
Glucose: 121 mg/dL — ABNORMAL HIGH (ref 65–99)
Potassium: 4.8 mmol/L (ref 3.5–5.2)
Sodium: 143 mmol/L (ref 134–144)
eGFR: 47 mL/min/{1.73_m2} — ABNORMAL LOW (ref >59)

## 2021-04-09 LAB — CBC WITH DIFFERENTIAL/PLATELET
Basophils Absolute: 0 10*3/uL (ref 0.0–0.2)
Basos: 0 %
EOS (ABSOLUTE): 0.2 10*3/uL (ref 0.0–0.4)
Eos: 3 %
Hematocrit: 32.8 % — ABNORMAL LOW (ref 34.0–46.6)
Hemoglobin: 10.9 g/dL — ABNORMAL LOW (ref 11.1–15.9)
Immature Grans (Abs): 0 10*3/uL (ref 0.0–0.1)
Immature Granulocytes: 1 %
Lymphocytes Absolute: 0.8 10*3/uL (ref 0.7–3.1)
Lymphs: 13 %
MCH: 31.6 pg (ref 26.6–33.0)
MCHC: 33.2 g/dL (ref 31.5–35.7)
MCV: 95 fL (ref 79–97)
Monocytes Absolute: 0.4 10*3/uL (ref 0.1–0.9)
Monocytes: 6 %
Neutrophils Absolute: 5 10*3/uL (ref 1.4–7.0)
Neutrophils: 77 %
Platelets: 245 10*3/uL (ref 150–450)
RBC: 3.45 x10E6/uL — ABNORMAL LOW (ref 3.77–5.28)
RDW: 14.2 % (ref 11.7–15.4)
WBC: 6.5 10*3/uL (ref 3.4–10.8)

## 2021-04-09 NOTE — Patient Instructions (Signed)
Goals Addressed      Acute Low Back Pain Managed       Timeframe:  Short-Term Goal Priority:  High Start Date: 01/27/21                            Expected End Date: 07/30/21  Next Scheduled Follow up date: 04/24/21       Patient Goals/Self Care Activities:  Self-administers medications as prescribed Attends all scheduled provider appointments Calls pharmacy for medication refills Calls provider office for new concerns or questions Discussed PT referral with PCP during scheduled visit                       Anemia Managed       Timeframe:  Long-Range Goal Priority:  Medium Start Date:  11/01/20                           Expected End Date:  11/01/21  Next Follow up date: 04/24/21  Over the next 180 days, patient will:  Continue to self administers medications as prescribed Continue to attend all scheduled provider appointments Continue to call pharmacy for medication refills Call provider office for new concerns or questions Follow up with Dr. Eleonore Chiquito as directed                         Chronic Kidney Disease - disease progression prevented or minimized       Timeframe:  Long-Range Goal Priority:  Medium Start Date: 01/13/21                            Expected End Date:  01/13/22  Next Scheduled Follow Up Date: 04/24/21  Self Care Activities:  Increase water to 64 oz daily unless otherwise directed  Continue to adhere to MD recommendations for CKD  Continue to keep all scheduled follow up appointments Take medications as directed  Let your healthcare team know if you are unable to take your medications Call your pharmacy for refills at least 7 days prior to running out of medication Patient Goals: - maintain or improve adequate kidney function                         Learn and Do Breathing Exercises-Asthma   On track    Follow Up Date 04/24/21    - do breathing exercises every day - do breathing exercises in a comfortable position that makes breathing as easy as possible     Why is this important?   Using breathing exercises may cut the use of your asthma relief medicine. You will be shown how to do the breathing exercises.  Practicing will help you remember how to do them when your symptoms flare up.    Notes:       Symptom Exacerbation Prevented or Minimized   On track    Timeframe:  Short-Term Goal Priority:  High Start Date:  04/03/21                         Expected End Date:  10/03/21  Next Scheduled Follow up: 04/24/21     Self Care Activities:  Self administers medications as prescribed Attends all scheduled provider appointments Calls pharmacy for medication refills Calls provider office for new concerns  or questions Patient Goals: - Symptom Exacerbation Prevented or Minimized - Follow up with PCP as scheduled - Call Pulmonologist to schedule a follow up appointment - Use deep breathing exercises as instructed to help control breathing and expand lung capacity                           COMPLETED: Synthroid Patient Assistance       CARE PLAN ENTRY (see longitudinal plan of care for additional care plan information)  Current Barriers:  Financial Barriers: patient has [employer] and reports copay for Synthroid is cost prohibitive at this time  Pharmacist Clinical Goal(s):  Over the next 30 days, patient will work with PharmD and providers to relieve medication access concerns  Interventions: Comprehensive medication review completed; medication list updated in electronic medical record.  Inter-disciplinary care team collaboration (see longitudinal plan of care) Synthroid by [employer]: Patient meets income criteria for this medication's patient assistance program. Reviewed application process. Patient provided proof of income, out of pocket spend report, and signed application. Will collaborate with primary care provider Dr. Glendale Chard for their portion of application. Completed application faxed to [employer] patient assistance program  on 04/04/57 Patient application was denied, but may be appealed by providing additional out of pocket medical and prescription expenses. [employer] faxed out of pocket expense form to office Patient provided out of pocket medical and prescription expenses for the past year in the form of estimation of benefits statements from [facility].  Will send completed out of pocket expense form and relevant documentation as provided by the patient to [facility] patient assistance program.  Samples given to patient today in the office (28 days)  Patient Self Care Activities:  Patient provided necessary out of pocket medical and prescription expenses (estimation of benefits statements)   Please see past updates related to this goal by clicking on the "Past Updates" button in the selected goal        Track and Manage Heart Rate and Rhythm-Atrial Fibrillation   On track    Timeframe:  Short-Term Goal Priority:  Medium Start Date:  01/27/21                           Expected End Date:  01/27/22                   Follow Up Date: 04/24/21   - begin a symptom diary - bring symptom diary to all appointments - check pulse (heart) rate once a day - make a plan to exercise regularly - make a plan to eat healthy - keep all lab appointments - take medicine as prescribed    Why is this important?   Atrial fibrillation may have no symptoms. Sometimes the symptoms get worse or happen more often.  It is important to keep track of what your symptoms are and when they happen.  A change in symptoms is important to discuss with your doctor or nurse.  Being active and healthy eating will also help you manage your heart condition.     Notes:       Track and Manage My Blood Pressure-Hypertension       Timeframe:  Long-Range Goal Priority:  Medium Start Date:  01/13/21                          Expected End Date: 07/15/21  Follow Up Date: 04/24/21   Self-Care Activities: Self administers medications as  prescribed Attends all scheduled provider appointments Calls provider office for new concerns, questions, or BP outside discussed parameters Checks BP and records as discussed Follows a low sodium diet/DASH diet Patient Goals: - check blood pressure 3 times per week - choose a place to take my blood pressure (home, clinic or office, retail store) - write blood pressure results in a log or diary - learn about high blood pressure   Why is this important?   You won't feel high blood pressure, but it can still hurt your blood vessels.  Making lifestyle changes like losing a Genavie Boettger weight or eating less salt will help.  Checking your blood pressure at home and at different times of the day can help to control blood pressure.  If the doctor prescribes medicine remember to take it the way the doctor ordered.  Call the office if you cannot afford the medicine or if there are questions about it.

## 2021-04-09 NOTE — Chronic Care Management (AMB) (Signed)
Chronic Care Management   CCM RN Visit Note  04/03/2021 Name: Hannah [name] MRN: [id number] DOB: 1945/11/22  Subjective: Hannah [name] is a 75 y.o. year old female who is a primary care patient of Hannah Chard, MD. The care management team was consulted for assistance with disease management and care coordination needs.    Engaged with patient by telephone for follow up visit in response to provider referral for case management and/or care coordination services.   Consent to Services:  The patient was given information about Chronic Care Management services, agreed to services, and gave verbal consent prior to initiation of services.  Please see initial visit note for detailed documentation.   Patient agreed to services and verbal consent obtained.   Assessment: Review of patient past medical history, allergies, medications, health status, including review of consultants reports, laboratory and other test data, was performed as part of comprehensive evaluation and provision of chronic care management services.   SDOH (Social Determinants of Health) assessments and interventions performed: Yes, no acute needs   CCM Care Plan  Allergies  Allergen Reactions   Shellfish Allergy Anaphylaxis and Hives   Shellfish-Derived Products Anaphylaxis    Outpatient Encounter Medications as of 04/03/2021  Medication Sig   amLODipine (NORVASC) 2.5 MG tablet TAKE 1 TABLET(2.5 MG) BY MOUTH DAILY   aspirin EC 81 MG tablet Take 81 mg by mouth daily.   atorvastatin (LIPITOR) 80 MG tablet TAKE 1 TABLET BY MOUTH EVERY DAY (Patient taking differently: At night)   metoprolol succinate (TOPROL-XL) 25 MG 24 hr tablet Take 1 tablet (25 mg total) by mouth daily.   [DISCONTINUED] apixaban (ELIQUIS) 5 MG TABS tablet Take 1 tablet (5 mg total) by mouth 2 (two) times daily.   Accu-Chek Softclix Lancets lancets Use to check blood sugars daily E11.69   albuterol (PROVENTIL HFA;VENTOLIN HFA) 108 (90 Base)  MCG/ACT inhaler Inhale 2 puffs into the lungs every 6 (six) hours as needed for wheezing or shortness of breath.   Alcohol Swabs (ALCOHOL PADS) 70 % PADS Use as directed to check blood sugars 1 time per day dx: e11.22   Ascorbic Acid (VITAMIN C) 1000 MG tablet Take 1,000 mg by mouth daily.   budesonide-formoterol (SYMBICORT) 160-4.5 MCG/ACT inhaler INHALE 2 PUFFS BY MOUTH TWICE DAILY IN THE MORNING AND EVENING   Calcium Carb-Cholecalciferol 600-800 MG-UNIT TABS Take 1 tablet by mouth daily.    Cholecalciferol (VITAMIN D PO) Take 2,000 Units by mouth daily.   cyanocobalamin 1000 MCG tablet Take 1,000 mcg by mouth daily.   denosumab (PROLIA) 60 MG/ML SOSY injection INJECT 60 MG INTO THE SKIN EVERY 6 (SIX) MONTHS.   glucose blood (ACCU-CHEK GUIDE) test strip Use to check blood sugars daily E11.69   hydroxychloroquine (PLAQUENIL) 200 MG tablet TAKE 1 TABLET BY MOUTH DAILY FOR RHEUMATOID ARTHRITIS   Hypromellose (ARTIFICIAL TEARS OP) Apply 1 drop to eye daily as needed (dry eyes).   irbesartan (AVAPRO) 300 MG tablet TAKE 1 TABLET(300 MG) BY MOUTH DAILY   iron polysaccharides (NIFEREX) 150 MG capsule Take 150 mg by mouth daily.   Liniments (BEN GAY EX) Apply 1 application topically daily as needed (pain).   SYNTHROID 88 MCG tablet TAKE 1 TABLET BY MOUTH EVERY DAY MONDAY TO SATURDAY AND OFF ON SUNDAYS   trolamine salicylate (ASPERCREME) 10 % cream Apply 1 application topically daily as needed for muscle pain.   [DISCONTINUED] allopurinol (ZYLOPRIM) 100 MG tablet TAKE 1 TABLET BY MOUTH EVERY DAY FOR GOUT   [  DISCONTINUED] Dextromethorphan-guaiFENesin 10-100 MG/5ML liquid Take 5 mLs by mouth every 6 (six) hours as needed.   No facility-administered encounter medications on file as of 04/03/2021.    Patient Active Problem List   Diagnosis Date Noted   Demand ischemia ([place]) 03/26/2021   Unspecified atrial fibrillation ([place]) 03/26/2021   COVID-19 virus infection 03/26/2021   PVC (premature ventricular  contraction) 05/06/2020   Type 2 diabetes mellitus with stage 3 chronic kidney disease, without long-term current use of insulin ([place]) 03/20/2020   Palpitations 03/20/2020   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/2020   Primary hypothyroidism 03/20/2020   Vitiligo 03/20/2020   Iron deficiency anemia 08/12/2017   Osteoporosis 10/02/2016   Systemic lupus erythematosus ([place]) 10/02/2016   Rheumatoid arthritis involving multiple sites with positive rheumatoid factor ([place]) 10/02/2016   High risk medication use 10/02/2016   History of chronic kidney disease 10/02/2016   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/2016   Primary osteoarthritis of both knees 10/02/2016   Vitamin D deficiency 10/02/2016   History of diabetes mellitus 10/02/2016   History of hypertension 10/02/2016   History of asthma 10/02/2016   Absolute anemia    MGUS (monoclonal gammopathy of unknown significance)    Normocytic anemia 03/21/2015   Essential hypertension 03/20/2015   Abnormal CT of the chest 07/30/2012   Asthma 06/19/2012    Conditions to be addressed/monitored: CKD III, hypertensive nephropathy, DM, RA, iron deficiency Anemia    Care Plan : Atrial Fibrillation (Adult)  Updates made by Lynne Logan, RN since 04/03/2021 12:00 AM     Problem: Dysrhythmia (Atrial Fibrillation)   Priority: Medium     Long-Range Goal: Heart Rate and Rhythm Monitored and Managed   Start Date: 01/27/2021  Expected End Date: 01/30/2022  Recent Progress: On track  Priority: High  Note:   Current Barriers:  Knowledge deficits related to self health management of palpitations  Chronic Disease Management support and education needs related to CKD III, hypertensive nephropathy, DM, RA, iron deficiency Anemia   Nurse Case Manager Clinical Goal(s):  Patient will verbalize understanding of Action Plan for management of dysrhythmia and when to call doctor Interventions:  Collaboration with Hannah Chard, MD regarding  development and update of comprehensive plan of care as evidenced by provider attestation and co-signature Inter-disciplinary care team collaboration (see longitudinal plan of care) Current Barriers:  Ineffective Self Health Maintenance in a patient with Atrial Fibrillation Clinical Goal(s):  Collaboration with Hannah Chard, MD regarding development and update of comprehensive plan of care as evidenced by provider attestation and co-signature Inter-disciplinary care team collaboration (see longitudinal plan of care) patient will work with care management team to address care coordination and chronic disease management needs related to Disease Management Educational Needs Care Coordination Medication Management and Education Psychosocial Support   Interventions:  04/03/21 completed successful outbound call with patient  Evaluation of current treatment plan related to Atrial Fibrillation, self-management and patient's adherence to plan as established by provider. Collaboration with Hannah Chard, MD regarding development and update of comprehensive plan of care as evidenced by provider attestation       and co-signature Inter-disciplinary care team collaboration (see longitudinal plan of care) Discussed and reviewed recent discharge instructions pending inpatient admission for new onset of Atrial Fibrillation  Admit date: 03/26/2021 Discharge date: 03/28/2021 Admitted From: Home  Disposition:  home  Recommendations for Outpatient Follow-up and new medication changes:  Follow up with Dr. Baird Cancer in 10 days. Follow up with cardiology as scheduled . Patient placed  on metoprolol XL for rate control and anticoagulation with apixaban.  Home Health: no   Equipment/Devices: no   Discharge Condition: stable  CODE STATUS: partial (no mechanical ventilation)  Diet recommendation:  heart healthy  Brief/Interim Summary: Hannah [name] was admitted to the hospital with a working diagnosis of new onset  atrial fibrillation, complicated with elevated troponins in the setting of SARS COVID 19 infection.   75 year old female past medical history for asthma, hypertension, chronic kidney disease stage IIIb, type II diabetes mellitus, mugs, hypothyroidism, rheumatoid arthritis, lupus, gout, and dyslipidemia who presented with palpitations. She tested positive for COVID-19, while in the outpatient clinic she developed tachycardia, EMS was called, she was noted to have a heart rate of 160 bpm that prompted her to be brought to the hospital.  On her way to the hospital she converted back to sinus rhythm.  Her blood pressure was 143/71, heart rate 74, respiratory rate 23, oxygen saturation 98%, her lungs were clear to auscultation bilaterally, heart S1-S2, present, rhythmic, abdomen soft, no lower extremity edema.   Sodium 137, potassium 4.5, chloride 102, bicarb 24, glucose 98, BUN 23, creatinine 1.57, anion gap 11, high sensitive troponin [id number]. White count 6.2, hemoglobin 9.9, hematocrit 36.3, platelets 121. SARS COVID-19 positive.   Chest radiograph no infiltrates.   EKG 73 bpm, left axis deviation, left anterior fascicular block, normal intervals, sinus rhythm, Q-wave V1-V2, no ST segment or T wave changes.   Patient was placed on heparin drip and echocardiogram was obtained.   No wall motion abnormalities, she remained on sinus rhythm. Will follow as outpatient with cardiology.    Discharge Diagnoses:  Principal Problem:   Unspecified atrial fibrillation (HCC) Active Problems:   Asthma   Essential hypertension   Rheumatoid arthritis involving multiple sites with positive rheumatoid factor (HCC)   Iron deficiency anemia   Type 2 diabetes mellitus with stage 3 chronic kidney disease, without long-term current use of insulin (HCC)   Primary hypothyroidism   Demand ischemia ([place])   COVID-19 virus infection New onset atrial fibrillation.  Patient was admitted to a telemetry unit,  she was placed on IV heparin for anticoagulation. Her telemetry remained sinus rhythm, no chest pain or further palpitations.   Underwent further work-up with echocardiography which showed left ventricle ejection fraction 55 to 60%.  No wall motion normalities.  Mild asymmetric left ventricular hypertrophy of the basal septal segment.  No significant valvular disease.   Patient will continue rate control with metoprolol and anticoagulation with apixaban.   2. Positive SARS COVID 19 infection.  Patient has been vaccinated and boosted for COVID-19. No signs of viral pneumonia. Continue symptomatic control and follow-up as outpatient.   3. HTN.  Her blood pressure remained stable, continue amlodipine, irbesartan and metoprolol.   4. COPD/ Asthma.  No signs of acute exacerbation, continue budesonide and formoterol.   5. Hypothyroid. On levothyroxine.   6. Dyslipidemia. On atorvastatin.      7. RA. No exacerbation, will continue with hydroxychloroquine    8. CKD stage 3b.  Her renal function remained stable, at discharge sodium 138, potassium 4.2, chloride 106, bicarb 26, glucose 82, BUN 25, creatinine 1.24.   9.  Reactive leukopenia, anemia and thrombocytopenia.  Iron deficiency anemia.  Likely related to acute viral illness, discharge white cell count 3.2, hemoglobin 9.9, hematocrit 30.9, platelets 105. Continue anticoagulation with apixaban. Determined patient verbalizes understanding of her discharge instructions Provided education to patient about basic disease process related to Atrial Fibrillation  Review  of patient status, including review of consultant's reports, relevant laboratory and other test results, and medications completed. Reviewed medications with patient and discussed importance of medication adherence Determined patient is unable to afford the cost of Apixaban and will need pharmacy assistance, embedded Pharm D referral sent  Reviewed scheduled/upcoming provider  appointments including: PCP follow up appointment scheduled for 04/08/21 @4 :15 PM with PCP, Dr. Glendale [name]; Cardiologist, Dr. Acie Fredrickson scheduled for 04/16/21 @4 :00 PM  Discussed plans with patient for ongoing care management follow up and provided patient with direct contact information for care management team Self-Care Activities: Patient verbalizes understanding of plan to contact her Cardiologist promptly for new or worsening symptoms and or call 911 for medical emergency Self administers medications as prescribed Attends all scheduled provider appointments Calls pharmacy for medication refills Calls provider office for new concerns or questions Patient Goals: - begin a symptom diary - bring symptom diary to all appointments - check pulse (heart) rate once a day - make a plan to exercise regularly - make a plan to eat healthy - keep all lab appointments - take medicine as prescribed   Follow Up Plan: Telephone follow up appointment with care management team member scheduled for: 04/24/21    Care Plan : Asthma (Adult)  Updates made by Lynne Logan, RN since 04/03/2021 12:00 AM     Problem: Symptom Exacerbation (Asthma)   Priority: High     Goal: Symptom Exacerbation Prevented or Minimized   Start Date: 04/03/2021  Expected End Date: 10/03/2021  This Visit's Progress: On track  Priority: High  Note:   Current Barriers:  Ineffective Self Health Maintenance in a patient with  Asthma  Clinical Goal(s):  Collaboration with Hannah Chard, MD regarding development and update of comprehensive plan of care as evidenced by provider attestation and co-signature Inter-disciplinary care team collaboration (see longitudinal plan of care) patient will work with care management team to address care coordination and chronic disease management needs related to Disease Management Educational Needs Care Coordination Medication Management and Education Psychosocial Support   Interventions:   04/03/21 completed successful outbound call with patient  Evaluation of current treatment plan related to  Asthma  , self-management and patient's adherence to plan as established by provider. Collaboration with Hannah Chard, MD regarding development and update of comprehensive plan of care as evidenced by provider attestation       and co-signature Inter-disciplinary care team collaboration (see longitudinal plan of care) Discussed and reviewed recent hospitalization for COVID 19 observation and treatment Admit date: 03/26/2021 Discharge date: 03/28/2021 Admitted From: Home  Disposition:  home  Recommendations for Outpatient Follow-up and new medication changes:  Follow up with Dr. Baird Cancer in 10 days. Follow up with cardiology as scheduled . Patient placed on metoprolol XL for rate control and anticoagulation with apixaban.  Home Health: no   Equipment/Devices: no   Discharge Condition: stable  CODE STATUS: partial (no mechanical ventilation)  Diet recommendation:  heart healthy  Determined patient continues to have a productive cough but no shortness of breath or respiratory distress Reviewed medications with patient and discussed importance of medication adherence, patient is using her inhalers as directed Educated patient on how to perform deep breathing exercises, patient is able to repeat instructions Encouraged patient to contact her Pulmonologist to schedule a follow up appointment if symptoms persist or worsen  Reviewed scheduled/upcoming provider appointments including: PCP hospital follow up appointment scheduled with Dr. Baird Cancer for 04/08/21 @4 :15 PM Discussed plans with patient for ongoing care management  follow up and provided patient with direct contact information for care management team Self Care Activities:  Self administers medications as prescribed Attends all scheduled provider appointments Calls pharmacy for medication refills Calls provider office for new concerns  or questions Patient Goals: - Symptom Exacerbation Prevented or Minimized - Follow up with PCP as scheduled - Call Pulmonologist to schedule a follow up appointment - Use deep breathing exercises as instructed to help control breathing and expand lung capacity   Follow Up Plan: Telephone follow up appointment with care management team member scheduled for: 04/24/21    Plan:Telephone follow up appointment with care management team member scheduled for:  04/24/21  Barb Merino, RN, BSN, CCM Care Management Coordinator [facility]  Direct Phone: [phone]

## 2021-04-10 ENCOUNTER — Encounter: Payer: Self-pay | Admitting: Internal Medicine

## 2021-04-10 ENCOUNTER — Telehealth: Payer: Self-pay

## 2021-04-10 NOTE — Chronic Care Management (AMB) (Signed)
Chronic Care Management Pharmacy Assistant   Name: Hannah [name]  MRN: [id number] DOB: 08-11-1946  Reason for Encounter: Patient Assistance Coordination/ Hospital Chart Review.  04/10/2021- Patient assistance application filled out for Eliquis with [facility] patient assistance program. Application printed, awaiting patient signature, provider signature and income documentation to fax.    Hospital visits:  Medication Reconciliation was completed by comparing discharge summary, patient's EMR and Pharmacy list, and upon discussion with patient.  Admitted to the hospital on 03/26/2021 due to Unspecified atrial fibrillation. Discharge date was 03/28/2021. Discharged from [place]?Medications Started at [facility] Discharge:?? -started Metoprolol Succinate 25 mg take 1 tablet daily and Apixaban 5 mg take 1 tablet twice daily due to New onset atrial fibrillation  Medication Changes at Hospital Discharge: -Changed None  Medications Discontinued at Hospital Discharge: -Stopped tacrolimus 0.1 % ointment and True Metrix Level 1 Low Soln due to patient no longer taking  Medications that remain the same after Hospital Discharge:??  -All other medications will remain the same.    Medications: Outpatient Encounter Medications as of 04/10/2021  Medication Sig   Accu-Chek Softclix Lancets lancets Use to check blood sugars daily E11.69   albuterol (PROVENTIL HFA;VENTOLIN HFA) 108 (90 Base) MCG/ACT inhaler Inhale 2 puffs into the lungs every 6 (six) hours as needed for wheezing or shortness of breath.   Alcohol Swabs (ALCOHOL PADS) 70 % PADS Use as directed to check blood sugars 1 time per day dx: e11.22   allopurinol (ZYLOPRIM) 100 MG tablet TAKE 1 TABLET BY MOUTH EVERY DAY FOR GOUT   amLODipine (NORVASC) 2.5 MG tablet TAKE 1 TABLET(2.5 MG) BY MOUTH DAILY   apixaban (ELIQUIS) 5 MG TABS tablet Take 1 tablet (5 mg total) by mouth 2 (two) times daily.    Ascorbic Acid (VITAMIN C) 1000 MG tablet Take 1,000 mg by mouth daily.   aspirin EC 81 MG tablet Take 81 mg by mouth daily.   atorvastatin (LIPITOR) 80 MG tablet TAKE 1 TABLET BY MOUTH EVERY DAY   budesonide-formoterol (SYMBICORT) 160-4.5 MCG/ACT inhaler INHALE 2 PUFFS BY MOUTH TWICE DAILY IN THE MORNING AND EVENING   Calcium Carb-Cholecalciferol 600-800 MG-UNIT TABS Take 1 tablet by mouth daily.    Cholecalciferol (VITAMIN D PO) Take 2,000 Units by mouth daily.   cyanocobalamin 1000 MCG tablet Take 1,000 mcg by mouth daily.   denosumab (PROLIA) 60 MG/ML SOSY injection INJECT 60 MG INTO THE SKIN EVERY 6 (SIX) MONTHS.   glucose blood (ACCU-CHEK GUIDE) test strip Use to check blood sugars daily E11.69   hydroxychloroquine (PLAQUENIL) 200 MG tablet TAKE 1 TABLET BY MOUTH DAILY FOR RHEUMATOID ARTHRITIS   Hypromellose (ARTIFICIAL TEARS OP) Apply 1 drop to eye daily as needed (dry eyes).   irbesartan (AVAPRO) 300 MG tablet TAKE 1 TABLET(300 MG) BY MOUTH DAILY   iron polysaccharides (NIFEREX) 150 MG capsule Take 150 mg by mouth daily.   Liniments (BEN GAY EX) Apply 1 application topically daily as needed (pain).   metoprolol succinate (TOPROL-XL) 25 MG 24 hr tablet Take 1 tablet (25 mg total) by mouth daily.   SYNTHROID 88 MCG tablet TAKE 1 TABLET BY MOUTH EVERY DAY MONDAY TO SATURDAY AND OFF ON SUNDAYS   trolamine salicylate (ASPERCREME) 10 % cream Apply 1 application topically daily as needed for muscle pain.   No facility-administered encounter medications on file as of 04/10/2021.    Care Gaps:  Annual Wellness Visit scheduled for 06/25/2021.   Star  Rating Drugs: Irbesartan 300 mg- Last filled 01/07/2021 for a 90 day supply at [facility] Atorvastatin 80 mg- Last filled 01/11/2021 for a 90 day supply at [facility]  Follow up visit with CCM Pharmacist [name] on 05/21/2021 at [place], [place] Pharmacist Assistant [phone]

## 2021-04-14 ENCOUNTER — Telehealth: Payer: Medicare HMO

## 2021-04-15 ENCOUNTER — Telehealth: Payer: Self-pay

## 2021-04-15 NOTE — Telephone Encounter (Signed)
  Care Management   Follow Up Note   04/15/2021 Name: Hannah [name] MRN: [id number] DOB: [birth year]   Referred by: Glendale Chard, MD Reason for referral : Chronic Care Management (RNCM Follow up )   Printed and mailed educational material related to Atrial Fibrillation as discussed during 04/03/21 encounter with patient.  Follow Up Plan: Telephone follow up appointment with care management team member scheduled for: 04/24/21  Barb Merino, RN, BSN, CCM Care Management Coordinator [facility]  Direct Phone: [phone]

## 2021-04-16 ENCOUNTER — Ambulatory Visit: Payer: Medicare HMO | Admitting: Cardiovascular Disease

## 2021-04-16 ENCOUNTER — Encounter: Payer: Self-pay | Admitting: Cardiovascular Disease

## 2021-04-16 ENCOUNTER — Inpatient Hospital Stay: Payer: Medicare HMO | Attending: Hematology

## 2021-04-16 ENCOUNTER — Other Ambulatory Visit: Payer: Self-pay

## 2021-04-16 VITALS — BP 134/72 | HR 63 | Ht <= 58 in | Wt 158.0 lb

## 2021-04-16 DIAGNOSIS — R059 Cough, unspecified: Secondary | ICD-10-CM | POA: Diagnosis not present

## 2021-04-16 DIAGNOSIS — D649 Anemia, unspecified: Secondary | ICD-10-CM

## 2021-04-16 DIAGNOSIS — M329 Systemic lupus erythematosus, unspecified: Secondary | ICD-10-CM | POA: Insufficient documentation

## 2021-04-16 DIAGNOSIS — N183 Chronic kidney disease, stage 3 unspecified: Secondary | ICD-10-CM | POA: Diagnosis not present

## 2021-04-16 DIAGNOSIS — R002 Palpitations: Secondary | ICD-10-CM

## 2021-04-16 DIAGNOSIS — I4891 Unspecified atrial fibrillation: Secondary | ICD-10-CM | POA: Insufficient documentation

## 2021-04-16 DIAGNOSIS — K449 Diaphragmatic hernia without obstruction or gangrene: Secondary | ICD-10-CM | POA: Insufficient documentation

## 2021-04-16 DIAGNOSIS — E119 Type 2 diabetes mellitus without complications: Secondary | ICD-10-CM | POA: Diagnosis not present

## 2021-04-16 DIAGNOSIS — Z8616 Personal history of COVID-19: Secondary | ICD-10-CM | POA: Insufficient documentation

## 2021-04-16 DIAGNOSIS — E785 Hyperlipidemia, unspecified: Secondary | ICD-10-CM | POA: Diagnosis not present

## 2021-04-16 DIAGNOSIS — I10 Essential (primary) hypertension: Secondary | ICD-10-CM | POA: Insufficient documentation

## 2021-04-16 DIAGNOSIS — Z79899 Other long term (current) drug therapy: Secondary | ICD-10-CM | POA: Insufficient documentation

## 2021-04-16 DIAGNOSIS — I480 Paroxysmal atrial fibrillation: Secondary | ICD-10-CM | POA: Diagnosis not present

## 2021-04-16 DIAGNOSIS — R0602 Shortness of breath: Secondary | ICD-10-CM | POA: Insufficient documentation

## 2021-04-16 DIAGNOSIS — D508 Other iron deficiency anemias: Secondary | ICD-10-CM

## 2021-04-16 DIAGNOSIS — E1151 Type 2 diabetes mellitus with diabetic peripheral angiopathy without gangrene: Secondary | ICD-10-CM | POA: Diagnosis not present

## 2021-04-16 LAB — CBC WITH DIFFERENTIAL/PLATELET
Abs Immature Granulocytes: 0.01 10*3/uL (ref 0.00–0.07)
Basophils Absolute: 0.1 10*3/uL (ref 0.0–0.1)
Basophils Relative: 1 %
Eosinophils Absolute: 0.3 10*3/uL (ref 0.0–0.5)
Eosinophils Relative: 5 %
HCT: 29.5 % — ABNORMAL LOW (ref 36.0–46.0)
Hemoglobin: 9.7 g/dL — ABNORMAL LOW (ref 12.0–15.0)
Immature Granulocytes: 0 %
Lymphocytes Relative: 18 %
Lymphs Abs: 1 10*3/uL (ref 0.7–4.0)
MCH: 31.7 pg (ref 26.0–34.0)
MCHC: 32.9 g/dL (ref 30.0–36.0)
MCV: 96.4 fL (ref 80.0–100.0)
Monocytes Absolute: 0.4 10*3/uL (ref 0.1–1.0)
Monocytes Relative: 8 %
Neutro Abs: 3.8 10*3/uL (ref 1.7–7.7)
Neutrophils Relative %: 68 %
Platelets: 183 10*3/uL (ref 150–400)
RBC: 3.06 MIL/uL — ABNORMAL LOW (ref 3.87–5.11)
RDW: 14.2 % (ref 11.5–15.5)
WBC: 5.5 10*3/uL (ref 4.0–10.5)
nRBC: 0 % (ref 0.0–0.2)

## 2021-04-16 LAB — FERRITIN: Ferritin: 62 ng/mL (ref 11–307)

## 2021-04-16 LAB — CMP (CANCER CENTER ONLY)
ALT: 10 U/L (ref 0–44)
AST: 17 U/L (ref 15–41)
Albumin: 3.3 g/dL — ABNORMAL LOW (ref 3.5–5.0)
Alkaline Phosphatase: 43 U/L (ref 38–126)
Anion gap: 6 (ref 5–15)
BUN: 19 mg/dL (ref 8–23)
CO2: 27 mmol/L (ref 22–32)
Calcium: 8.7 mg/dL — ABNORMAL LOW (ref 8.9–10.3)
Chloride: 106 mmol/L (ref 98–111)
Creatinine: 1.1 mg/dL — ABNORMAL HIGH (ref 0.44–1.00)
GFR, Estimated: 52 mL/min — ABNORMAL LOW (ref >60)
Glucose, Bld: 88 mg/dL (ref 70–99)
Potassium: 4.3 mmol/L (ref 3.5–5.1)
Sodium: 139 mmol/L (ref 135–145)
Total Bilirubin: 0.5 mg/dL (ref 0.3–1.2)
Total Protein: 6.2 g/dL — ABNORMAL LOW (ref 6.5–8.1)

## 2021-04-16 LAB — IRON AND TIBC
Iron: 51 ug/dL (ref 41–142)
Saturation Ratios: 21 % (ref 21–57)
TIBC: 249 ug/dL (ref 236–444)
UIBC: 197 ug/dL (ref 120–384)

## 2021-04-16 LAB — VITAMIN B12: Vitamin B-12: 1907 pg/mL — ABNORMAL HIGH (ref 180–914)

## 2021-04-16 MED ORDER — APIXABAN 5 MG PO TABS: 5.0000 mg | ORAL_TABLET | Freq: Two times a day (BID) | ORAL | 3 refills | Status: DC

## 2021-04-16 MED ORDER — METOPROLOL SUCCINATE ER 25 MG PO TB24: 25.0000 mg | ORAL_TABLET | Freq: Every day | ORAL | 3 refills | Status: DC

## 2021-04-16 NOTE — Patient Instructions (Signed)
Medication Instructions:  Your physician recommends that you continue on your current medications as directed. Please refer to the Current Medication list given to you today.  *If you need a refill on your cardiac medications before your next appointment, please call your pharmacy*   Lab Work: None Ordered If you have labs (blood work) drawn today and your tests are completely normal, you will receive your results only by: [place] (if you have MyChart) OR A paper copy in the mail If you have any lab test that is abnormal or we need to change your treatment, we will call you to review the results.   Testing/Procedures: None Ordered   Follow-Up: At [facility], you and your health needs are our priority.  As part of our continuing mission to provide you with exceptional heart care, we have created designated Provider Care Teams.  These Care Teams include your primary Cardiologist (physician) and Advanced Practice Providers (APPs -  Physician Assistants and Nurse Practitioners) who all work together to provide you with the care you need, when you need it.   Your next appointment:   6 month(s)  The format for your next appointment:   In Person  Provider:   You may see Mertie Moores, MD or one of the following Advanced Practice Providers on your designated Care Team:   Richardson Dopp, PA-C [place], [place]

## 2021-04-16 NOTE — Progress Notes (Signed)
Cardiology Office Note:    Date:  04/16/2021   ID:  Hannah, [name] [birth year], MRN [id number]  PCP:  Glendale Chard, MD  [facility] Cardiologist:  New to [place] Electrophysiologist:  None   Referring MD: Glendale Chard, MD   Chief Complaint  Patient presents with   Hypertension    History of Present Illness:    Hannah [name] is a 75 y.o. female with a hx of palpitations.   We are asked to see her by Dr. Baird Cancer for further evaluation of these palpitations.   Hx of hyperlipidemia, hy[pothyroidism, HTN    These palpitations occur randomly.  Typically at night when she is trying to go to sleep Occurs rarely.  started 4 months ago.   Last episode  Was a month ago  Typically at night when she is in bed.   No dizziness,  No sweats.  Single isolated HR irreg.  Exercises 4 times a week.   Not limited by palpitations  ECGs have been normal  TSH is normal and has been stable   Has had both covid vaccines Sees hematology for mild anemia    April 16, 2021: Hannah [name] is seen today for follow up of her palpitations.  She was found to have PAF during a COVID infection   ( See Media - March 26, 2021, Photos)  She was started on Eliquis 5 m gBID and  toprol XL 25 mg a day   CHADS2VASC is 79 ( age 62, female, HTN, DM)  She needs to have a knee replacement at some point She is at low risk for her upcoming knee surgery   Past Medical History:  Diagnosis Date   Anemia    Arthritis    Asthma    Diabetes mellitus without complication ([place])    Gout 12/17/2014   patient reported   Hyperlipidemia    Hypertension    Systemic lupus erythematosus ([place])    Vitiligo     Past Surgical History:  Procedure Laterality Date   CATARACT EXTRACTION Bilateral 2015   DILATION AND CURETTAGE OF UTERUS     DOPPLER ECHOCARDIOGRAPHY  05/2018   Internist to review with pt; potential heart murmur 06/20/18   keratosis removal  2021   SKIN SURGERY  11/30/2018   left  side of face   TOOTH EXTRACTION      Current Medications: Current Meds  Medication Sig   Accu-Chek Softclix Lancets lancets Use to check blood sugars daily E11.69   albuterol (PROVENTIL HFA;VENTOLIN HFA) 108 (90 Base) MCG/ACT inhaler Inhale 2 puffs into the lungs every 6 (six) hours as needed for wheezing or shortness of breath.   Alcohol Swabs (ALCOHOL PADS) 70 % PADS Use as directed to check blood sugars 1 time per day dx: e11.22   allopurinol (ZYLOPRIM) 100 MG tablet TAKE 1 TABLET BY MOUTH EVERY DAY FOR GOUT   amLODipine (NORVASC) 2.5 MG tablet TAKE 1 TABLET(2.5 MG) BY MOUTH DAILY   apixaban (ELIQUIS) 5 MG TABS tablet Take 1 tablet (5 mg total) by mouth 2 (two) times daily.   Ascorbic Acid (VITAMIN C) 1000 MG tablet Take 1,000 mg by mouth daily.   atorvastatin (LIPITOR) 80 MG tablet TAKE 1 TABLET BY MOUTH EVERY DAY   budesonide-formoterol (SYMBICORT) 160-4.5 MCG/ACT inhaler INHALE 2 PUFFS BY MOUTH TWICE DAILY IN THE MORNING AND EVENING   Calcium Carb-Cholecalciferol 600-800 MG-UNIT TABS Take 1 tablet by mouth daily.    Cholecalciferol (VITAMIN D PO) Take 2,000  Units by mouth daily.   cyanocobalamin 1000 MCG tablet Take 1,000 mcg by mouth daily.   denosumab (PROLIA) 60 MG/ML SOSY injection INJECT 60 MG INTO THE SKIN EVERY 6 (SIX) MONTHS.   glucose blood (ACCU-CHEK GUIDE) test strip Use to check blood sugars daily E11.69   hydroxychloroquine (PLAQUENIL) 200 MG tablet TAKE 1 TABLET BY MOUTH DAILY FOR RHEUMATOID ARTHRITIS   Hypromellose (ARTIFICIAL TEARS OP) Apply 1 drop to eye daily as needed (dry eyes).   irbesartan (AVAPRO) 300 MG tablet TAKE 1 TABLET(300 MG) BY MOUTH DAILY   iron polysaccharides (NIFEREX) 150 MG capsule Take 150 mg by mouth daily.   Liniments (BEN GAY EX) Apply 1 application topically daily as needed (pain).   metoprolol succinate (TOPROL-XL) 25 MG 24 hr tablet Take 1 tablet (25 mg total) by mouth daily.   SYNTHROID 88 MCG tablet TAKE 1 TABLET BY MOUTH EVERY DAY MONDAY  TO SATURDAY AND OFF ON SUNDAYS   trolamine salicylate (ASPERCREME) 10 % cream Apply 1 application topically daily as needed for muscle pain.     Allergies:   Shellfish allergy and Shellfish-derived products   Social History   Socioeconomic History   Marital status: Single    Spouse name: Not on file   Number of children: 0   Years of education: Not on file   Highest education level: Not on file  Occupational History   Occupation: retired  Tobacco Use   Smoking status: Never   Smokeless tobacco: Never  Vaping Use   Vaping Use: Never used  Substance and Sexual Activity   Alcohol use: Not Currently    Comment: rarely    Drug use: No   Sexual activity: Not Currently    Comment: 1ST intercourse- 62 , partners- 46,   Other Topics Concern   Not on file  Social History Narrative   Not on file   Social Determinants of Health   Financial Resource Strain: Low Risk    Difficulty of Paying Living Expenses: Not hard at all  Food Insecurity: No Food Insecurity   Worried About Charity fundraiser in the Last Year: Never true   Arboriculturist in the Last Year: Never true  Transportation Needs: No Transportation Needs   Lack of Transportation (Medical): No   Lack of Transportation (Non-Medical): No  Physical Activity: Sufficiently Active   Days of Exercise per Week: 4 days   Minutes of Exercise per Session: 40 min  Stress: Stress Concern Present   Feeling of Stress : To some extent  Social Connections: Not on file     Family History: The patient's family history includes Diabetes in her father; Heart disease in her father; Hypertension in her mother, sister, and sister; Leukemia in her sister.  ROS:   Please see the history of present illness.     All other systems reviewed and are negative.  EKGs/Labs/Other Studies Reviewed:    The following studies were reviewed today:   EKG:    April 16, 2021: Normal sinus rhythm at 63.  No ST or T wave changes.  Recent  Labs: 03/26/2021: Magnesium 1.8; TSH 0.690 04/16/2021: ALT 10; BUN 19; Creatinine 1.10; Hemoglobin 9.7; Platelets 183; Potassium 4.3; Sodium 139  Recent Lipid Panel    Component Value Date/Time   CHOL 153 12/30/2020 1254   TRIG 50 12/30/2020 1254   HDL 75 12/30/2020 1254   CHOLHDL 2.0 12/30/2020 1254   LDLCALC 67 12/30/2020 1254    Physical Exam:  Physical Exam: Blood pressure 134/72, pulse 63, height 4' 9.2" (1.453 m), weight 158 lb (71.7 kg).  GEN:  Well nourished, well developed in no acute distress HEENT: Normal NECK: No JVD; No carotid bruits LYMPHATICS: No lymphadenopathy CARDIAC: RRR , no murmurs, rubs, gallops RESPIRATORY:  Clear to auscultation without rales, wheezing or rhonchi  ABDOMEN: Soft, non-tender, non-distended MUSCULOSKELETAL:  No edema; No deformity  SKIN: Warm and dry NEUROLOGIC:  Alert and oriented x 3    ASSESSMENT:    No diagnosis found. PLAN:     PAF : She was found to have an episode of paroxysmal atrial fibrillation when she had a COVID infection.  While typically not sure that that would absolutely necessitate lifelong anticoagulation she does have a CHADS2 Vascor of 5 which places her at increased risk of stroke.  I would favor continuing Eliquis at least for a while and following her to make sure that she is clear.  I would much rather have her take Eliquis and be protected against a stroke since she probably is at some increased risk of developing recurrent atrial fibrillation. Her CBC and basic metabolic profile were stable as of this morning.  She does have baseline anemia and sees a hematologist.  She will follow-up with an APP in 6 months.   Medication Adjustments/Labs and Tests Ordered: Current medicines are reviewed at length with the patient today.  Concerns regarding medicines are outlined above.  No orders of the defined types were placed in this encounter.  No orders of the defined types were placed in this encounter.   There  are no Patient Instructions on file for this visit.   Signed, Mertie Moores, MD  04/16/2021 4:24 PM    [place]

## 2021-04-17 NOTE — Progress Notes (Signed)
[name]  HEMATOLOGY ONCOLOGY PROGRESS NOTE  Date of service: 04/17/2021  Patient Care Team: Glendale Chard, MD as PCP - General (Internal Medicine) Nahser, Wonda Cheng, MD as PCP - Cardiology (Cardiology) Little, Claudette Stapler, RN as [place] [facility] Caudill, Kennieth Francois, 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 Bronaugh is here for followup for her MGUS and anemia. The patient's last visit with [place] was on 06/27/2020. The pt reports that she is doing well overall.  The pt reports issues with recent diagnosed of p atrial fibrillation.Afib Had Covid 6/29 Started on Eliquis on Afib Taking Iron polysaccharide po daily  Lab results today 04/18/2021 of CBC w/diff and CMP  reviewed with patient hgb 9.7, hgb down from 275 to 62. B12 wnl   On review of systems, pt reports no bleeding or other acute new symptoms.   REVIEW OF SYSTEMS:   10 Point review of Systems was done is negative except as noted above.  . Past Medical History:  Diagnosis Date   Anemia    Arthritis    Asthma    Diabetes mellitus without complication ([place])    Gout 12/17/2014   patient reported   Hyperlipidemia    Hypertension    Systemic lupus erythematosus ([place])    Vitiligo   possible SLE/rheumatoid arthritis following with Dr.Deveshwar.  . Past Surgical History:  Procedure Laterality Date   CATARACT EXTRACTION Bilateral 2015   DILATION AND CURETTAGE OF UTERUS     DOPPLER ECHOCARDIOGRAPHY  05/2018   Internist to review with pt; potential heart murmur 06/20/18   keratosis removal  2021   SKIN SURGERY  11/30/2018   left side of face   TOOTH EXTRACTION      . Social History   Tobacco Use   Smoking status: Never    Smokeless tobacco: Never  Vaping Use   Vaping Use: Never used  Substance Use Topics   Alcohol use: Not Currently    Comment: rarely    Drug use: No    ALLERGIES:  is allergic to shellfish allergy and shellfish-derived products.  MEDICATIONS:  Current Outpatient Medications  Medication Sig Dispense Refill   Accu-Chek Softclix Lancets lancets Use to check blood sugars daily E11.69 100 each 2   albuterol (PROVENTIL HFA;VENTOLIN HFA) 108 (90 Base) MCG/ACT inhaler Inhale 2 puffs into the lungs every 6 (six) hours as needed for wheezing or shortness of breath. 1 Inhaler 2   Alcohol Swabs (ALCOHOL PADS) 70 % PADS Use as directed to check blood sugars 1 time per day dx: e11.22 150 each 2   allopurinol (ZYLOPRIM) 100 MG tablet TAKE 1 TABLET BY MOUTH EVERY DAY FOR GOUT 90 tablet 1   amLODipine (NORVASC) 2.5 MG tablet TAKE 1 TABLET(2.5 MG) BY MOUTH DAILY 30 tablet 11   apixaban (ELIQUIS) 5 MG TABS tablet Take 1 tablet (5 mg total) by mouth 2 (two) times daily. 180 tablet 3   Ascorbic Acid (VITAMIN C) 1000 MG tablet Take 1,000 mg by mouth daily.     atorvastatin (LIPITOR) 80 MG tablet TAKE 1 TABLET BY MOUTH EVERY DAY 90 tablet 1   budesonide-formoterol (SYMBICORT) 160-4.5 MCG/ACT inhaler INHALE 2 PUFFS BY MOUTH TWICE DAILY IN THE MORNING AND EVENING 10.2  g 2   Calcium Carb-Cholecalciferol 600-800 MG-UNIT TABS Take 1 tablet by mouth daily.      Cholecalciferol (VITAMIN D PO) Take 2,000 Units by mouth daily.     cyanocobalamin 1000 MCG tablet Take 1,000 mcg by mouth daily.     denosumab (PROLIA) 60 MG/ML SOSY injection INJECT 60 MG INTO THE SKIN EVERY 6 (SIX) MONTHS. 1 mL 0   glucose blood (ACCU-CHEK GUIDE) test strip Use to check blood sugars daily E11.69 100 each 2   hydroxychloroquine (PLAQUENIL) 200 MG tablet TAKE 1 TABLET BY MOUTH DAILY FOR RHEUMATOID ARTHRITIS 90 tablet 0   Hypromellose (ARTIFICIAL TEARS OP) Apply 1 drop to eye daily as needed (dry eyes).     irbesartan (AVAPRO) 300 MG  tablet TAKE 1 TABLET(300 MG) BY MOUTH DAILY 90 tablet 2   iron polysaccharides (NIFEREX) 150 MG capsule Take 150 mg by mouth daily.     Liniments (BEN GAY EX) Apply 1 application topically daily as needed (pain).     metoprolol succinate (TOPROL-XL) 25 MG 24 hr tablet Take 1 tablet (25 mg total) by mouth daily. 90 tablet 3   SYNTHROID 88 MCG tablet TAKE 1 TABLET BY MOUTH EVERY DAY MONDAY TO SATURDAY AND OFF ON SUNDAYS 90 tablet 0   trolamine salicylate (ASPERCREME) 10 % cream Apply 1 application topically daily as needed for muscle pain.     No current facility-administered medications for this visit.    PHYSICAL EXAMINATION: ECOG PERFORMANCE STATUS: 1 - Symptomatic but completely ambulatory  Vitals:   04/18/21 1105  BP: (!) 159/63  Pulse: (!) 57  Resp: 18  Temp: 98.1 F (36.7 C)  SpO2: 98%    Filed Weights   04/18/21 1105  Weight: 157 lb 2 oz (71.3 kg)   .Body mass index is 33.76 kg/m.   NAD GENERAL:alert, in no acute distress and comfortable SKIN: no acute rashes, no significant lesions EYES: conjunctiva are pink and non-injected, sclera anicteric OROPHARYNX: MMM, no exudates, no oropharyngeal erythema or ulceration NECK: supple, no JVD LYMPH:  no palpable lymphadenopathy in the cervical, axillary or inguinal regions LUNGS: clear to auscultation b/l with normal respiratory effort HEART: regular rate & rhythm ABDOMEN:  normoactive bowel sounds , non tender, not distended. No palpable hepatosplenomegaly.  Extremity: no pedal edema PSYCH: alert & oriented x 3 with fluent speech NEURO: no focal motor/sensory deficits  LABORATORY DATA:   I have reviewed the data as listed  . CBC Latest Ref Rng & Units 04/16/2021 04/08/2021 03/28/2021  WBC 4.0 - 10.5 K/uL 5.5 6.5 3.2(L)  Hemoglobin 12.0 - 15.0 g/dL 9.7(L) 10.9(L) 9.9(L)  Hematocrit 36.0 - 46.0 % 29.5(L) 32.8(L) 30.9(L)  Platelets 150 - 400 K/uL 183 245 105(L)    . [name]   Lab Results  Component Value Date   FERRITIN  62 04/16/2021   . Lab Results  Component Value Date   IRON 51 04/16/2021   TIBC 249 04/16/2021   IRONPCTSAT 21 04/16/2021   (Iron and TIBC)  Lab Results  Component Value Date   FERRITIN 62 04/16/2021          RADIOGRAPHIC STUDIES: I have personally reviewed the radiological images as listed and agreed with the findings in the report. DG Chest Portable 1 View  Result Date: 03/26/2021 CLINICAL DATA:  Cough and shortness of breath for 2 days, COVID-19 positivity EXAM: PORTABLE CHEST 1 VIEW COMPARISON:  09/24/2016 FINDINGS: Cardiac shadow is stable. Aortic calcifications are noted. Lungs are well aerated bilaterally. No focal  infiltrate or effusion is seen. No bony abnormality is noted. IMPRESSION: No acute abnormality noted. Electronically Signed   By: Inez Catalina M.D.   On: 03/26/2021 16:23   ECHOCARDIOGRAM COMPLETE  Result Date: 03/27/2021    ECHOCARDIOGRAM REPORT   Patient Name:   CORNESHA RADZIEWICZ Date of Exam: 03/27/2021 Medical Rec #:  [id number]          Height:       58.0 in Accession #:    [id number]         Weight:       155.1 lb Date of Birth:  1945-12-07         BSA:          1.635 m Patient Age:    75 years           BP:           132/60 mmHg Patient Gender: F                  HR:           54 bpm. Exam Location:  Inpatient Procedure: 2D Echo, Cardiac Doppler and Color Doppler Indications:    NSTEMI I21.4  History:        Patient has no prior history of Echocardiogram examinations,                 most recent 05/24/2018. Risk Factors:Hypertension, Diabetes and                 Dyslipidemia. COVID-19. Lupus.  Sonographer:    Jonelle Sidle Dance Referring Phys: [id number] [place]  1. Left ventricular ejection fraction, by estimation, is 55 to 60%. The left ventricle has normal function. The left ventricle has no regional wall motion abnormalities. There is mild asymmetric left ventricular hypertrophy of the basal-septal segment. Left ventricular diastolic  parameters are consistent with Grade I diastolic dysfunction (impaired relaxation).  2. Right ventricular systolic function is normal. The right ventricular size is normal. There is normal pulmonary artery systolic pressure. The estimated right ventricular systolic pressure is 42.6 mmHg.  3. The mitral valve is grossly normal. Trivial mitral valve regurgitation. No evidence of mitral stenosis.  4. The aortic valve is tricuspid. There is mild calcification of the aortic valve. Aortic valve regurgitation is not visualized. Mild aortic valve sclerosis is present, with no evidence of aortic valve stenosis.  5. The inferior vena cava is normal in size with greater than 50% respiratory variability, suggesting right atrial pressure of 3 mmHg. Comparison(s): No significant change from prior study. FINDINGS  Left Ventricle: Left ventricular ejection fraction, by estimation, is 55 to 60%. The left ventricle has normal function. The left ventricle has no regional wall motion abnormalities. The left ventricular internal cavity size was normal in size. There is  mild asymmetric left ventricular hypertrophy of the basal-septal segment. Left ventricular diastolic parameters are consistent with Grade I diastolic dysfunction (impaired relaxation). Right Ventricle: The right ventricular size is normal. No increase in right ventricular wall thickness. Right ventricular systolic function is normal. There is normal pulmonary artery systolic pressure. The tricuspid regurgitant velocity is 2.46 m/s, and  with an assumed right atrial pressure of 3 mmHg, the estimated right ventricular systolic pressure is 83.4 mmHg. Left Atrium: Left atrial size was normal in size. Right Atrium: Right atrial size was normal in size. Pericardium: Trivial pericardial effusion is present. Mitral Valve: The mitral valve is grossly normal. Trivial mitral valve regurgitation. No evidence  of mitral valve stenosis. Tricuspid Valve: The tricuspid valve is grossly  normal. Tricuspid valve regurgitation is trivial. No evidence of tricuspid stenosis. Aortic Valve: The aortic valve is tricuspid. There is mild calcification of the aortic valve. Aortic valve regurgitation is not visualized. Mild aortic valve sclerosis is present, with no evidence of aortic valve stenosis. Pulmonic Valve: The pulmonic valve was grossly normal. Pulmonic valve regurgitation is trivial. No evidence of pulmonic stenosis. Aorta: The aortic root and ascending aorta are structurally normal, with no evidence of dilitation. Venous: The inferior vena cava is normal in size with greater than 50% respiratory variability, suggesting right atrial pressure of 3 mmHg. IAS/Shunts: The atrial septum is grossly normal.  LEFT VENTRICLE PLAX 2D LVIDd:         4.00 cm  Diastology LVIDs:         2.80 cm  LV e' medial:   6.64 cm/s LV PW:         1.10 cm  LV E/e' medial: 7.9 LV IVS:        1.10 cm LVOT diam:     2.00 cm LV SV:         80 LV SV Index:   49 LVOT Area:     3.14 cm  RIGHT VENTRICLE          IVC RV Basal diam:  2.90 cm  IVC diam: 1.20 cm TAPSE (M-mode): 1.8 cm LEFT ATRIUM             Index       RIGHT ATRIUM           Index LA diam:        3.80 cm 2.32 cm/m  RA Area:     15.00 cm LA Vol (A2C):   48.4 ml 29.61 ml/m RA Volume:   40.00 ml  24.47 ml/m LA Vol (A4C):   31.6 ml 19.33 ml/m LA Biplane Vol: 39.0 ml 23.86 ml/m  AORTIC VALVE LVOT Vmax:   104.00 cm/s LVOT Vmean:  77.000 cm/s LVOT VTI:    0.254 m  AORTA Ao Root diam: 3.10 cm Ao Asc diam:  2.90 cm MITRAL VALVE               TRICUSPID VALVE MV Area (PHT): 2.26 cm    TR Peak grad:   24.2 mmHg MV Decel Time: 335 msec    TR Vmax:        246.00 cm/s MV E velocity: 52.30 cm/s MV A velocity: 60.00 cm/s  SHUNTS MV E/A ratio:  0.87        Systemic VTI:  0.25 m                            Systemic Diam: 2.00 cm Eleonore Chiquito MD Electronically signed by Eleonore Chiquito MD Signature Date/Time: 03/27/2021/6:27:17 PM    Final    METASTATIC BONE SURVEY 0/02/2015    COMPARISON:  None.   FINDINGS: Heart is normal size. Right diaphragmatic hernia again noted, unchanged. Lungs are clear. No effusions.   No focal lytic lesions within the visualized bony structures or acute bony abnormality. Degenerative changes within the shoulders, lower cervical spine, mid to lower thoracic spine, and lower lumbar spine. Advanced degenerative facet disease in the lower lumbar spine. 9 mm of anterolisthesis of L4 on L5. Endplate sclerosis within L4 and L5. Mild degenerative changes in the hips. Advanced degenerative changes within the knees. Sclerosis around both SI  joints compatible with sacroiliitis.   IMPRESSION: No focal lytic lesion.   Degenerative joint disease involving multiple joints as described above.   Grade 2 anterolisthesis of L4 on L5 related to facet disease.   ASSESSMENT & PLAN:   75 year old of an American female with   #1 IgG kappa monoclonal gammopathy of undetermined significance. Bone survey X-ray showed no lytic lesions. M spike is 0.6 g/dL in 10/016 and then 0.5g/dl in 10/2015. SPEP in May/2017 and 07/2016 showed M proteinwas stable at 0.3g/dl  MGUS likely related to underlying connective tissue disorder.  Bone marrow biopsy does show about 13% plasma cells however these appear to be polyclonal and likely related to a possible underlying inflammatory disorder. Less likely a biclonal plasma cell dyscrasia.  #2 Normocytic normochromic anemia likely related to chronic inflammation from recent diagnosis of lupus/rheumatoid arthritis + CKD.  Increased acanthocytes on peripheral blood smear -no overt evidence of liver disease. Could be a marker of some MDS Bone marrow biopsy did not show overt plasma cell dyscrasia or myelodysplastic syndrome. 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 Bone marrow biopsy showed decreased iron stores. Ferritin and iron have been trending  down, she has not noticed any issues with bruising or bleeding recently. ?absorption issue. I have given her the option of IV iron which she has previously had without issue. She would like to have this.   PLAN: -Discussed pt labwork today, 04/18/2021 -Recommend pt continue 150 mg Iron Polysaccharide daily -will schedule for IV iron to keep ferritin levels >100 -Recommend pt f/u with Dr. Estanislado Pandy for management of her autoimmune condition.  FOLLOW UP: IV injectafer weekly x 2 doses RTC with Dr Irene Limbo with labs in 3 months   The total time spent in the appt was 20 minutes and more than 50% was on counseling and direct patient cares.  All of the patient's questions were answered with apparent satisfaction. The patient knows to call the clinic with any problems, questions or concerns.   Sullivan Lone MD [place] AAHIVMS [facility] Hematology/Oncology Physician [facility]  (Office):       [phone] (Work cell):  [phone] (Fax):           [phone]  I, Reinaldo Raddle, am acting as scribe for Dr. Sullivan Lone, MD.     .I have reviewed the above documentation for accuracy and completeness, and I agree with the above. Brunetta Genera MD

## 2021-04-18 ENCOUNTER — Telehealth: Payer: Self-pay | Admitting: Hematology

## 2021-04-18 ENCOUNTER — Telehealth: Payer: Self-pay

## 2021-04-18 ENCOUNTER — Inpatient Hospital Stay: Payer: Medicare HMO | Admitting: Hematology

## 2021-04-18 ENCOUNTER — Other Ambulatory Visit: Payer: Self-pay

## 2021-04-18 VITALS — BP 159/63 | HR 57 | Temp 98.1°F | Resp 18 | Wt 157.1 lb

## 2021-04-18 DIAGNOSIS — I4891 Unspecified atrial fibrillation: Secondary | ICD-10-CM | POA: Diagnosis not present

## 2021-04-18 DIAGNOSIS — D649 Anemia, unspecified: Secondary | ICD-10-CM | POA: Diagnosis not present

## 2021-04-18 DIAGNOSIS — N183 Chronic kidney disease, stage 3 unspecified: Secondary | ICD-10-CM | POA: Diagnosis not present

## 2021-04-18 DIAGNOSIS — M329 Systemic lupus erythematosus, unspecified: Secondary | ICD-10-CM | POA: Diagnosis not present

## 2021-04-18 DIAGNOSIS — E785 Hyperlipidemia, unspecified: Secondary | ICD-10-CM | POA: Diagnosis not present

## 2021-04-18 DIAGNOSIS — R0602 Shortness of breath: Secondary | ICD-10-CM | POA: Diagnosis not present

## 2021-04-18 DIAGNOSIS — E119 Type 2 diabetes mellitus without complications: Secondary | ICD-10-CM | POA: Diagnosis not present

## 2021-04-18 DIAGNOSIS — R059 Cough, unspecified: Secondary | ICD-10-CM | POA: Diagnosis not present

## 2021-04-18 DIAGNOSIS — I10 Essential (primary) hypertension: Secondary | ICD-10-CM | POA: Diagnosis not present

## 2021-04-18 NOTE — Chronic Care Management (AMB) (Signed)
Chronic Care Management Pharmacy Assistant   Name: Hannah [name]  MRN: [id number] DOB: [birth year]   Reason for Encounter: Disease State/ Diabetes  Recent office visits:  04-08-2021 Glendale Chard, MD. Completed Dextromethorphan-guaiFENesin. Glucose= 121, Creatinine= 1.21, eGFR= 47. RBC= 3.45, Hemo= 10.9, Hema= 32.8.  Recent consult visits:  04-16-2021 Nahser, Wonda Cheng, MD (Cardiology). STOP Aspirin 81 mg. STOP Ibuprofen 800 mg as needed.  Hospital visits:  Medication Reconciliation was completed by comparing discharge summary, patient's EMR and Pharmacy list, and upon discussion with patient.  Admitted to the hospital on 03-26-2021 due to atrial fibrillation. Discharge date was 03-28-2021. Discharged from [facility]?Medications Started at [facility] Discharge:?? Eliquis 5 mg twice daily Metoprolol 25 mg daily  Medication Changes at Hospital Discharge: None  Medications Discontinued at Hospital Discharge: None  Medications that remain the same after Hospital Discharge:??  -All other medications will remain the same.    Medications: Outpatient Encounter Medications as of 04/18/2021  Medication Sig   Accu-Chek Softclix Lancets lancets Use to check blood sugars daily E11.69   albuterol (PROVENTIL HFA;VENTOLIN HFA) 108 (90 Base) MCG/ACT inhaler Inhale 2 puffs into the lungs every 6 (six) hours as needed for wheezing or shortness of breath.   Alcohol Swabs (ALCOHOL PADS) 70 % PADS Use as directed to check blood sugars 1 time per day dx: e11.22   allopurinol (ZYLOPRIM) 100 MG tablet TAKE 1 TABLET BY MOUTH EVERY DAY FOR GOUT   amLODipine (NORVASC) 2.5 MG tablet TAKE 1 TABLET(2.5 MG) BY MOUTH DAILY   apixaban (ELIQUIS) 5 MG TABS tablet Take 1 tablet (5 mg total) by mouth 2 (two) times daily.   Ascorbic Acid (VITAMIN C) 1000 MG tablet Take 1,000 mg by mouth daily.   atorvastatin (LIPITOR) 80 MG tablet TAKE 1 TABLET BY MOUTH EVERY DAY   budesonide-formoterol  (SYMBICORT) 160-4.5 MCG/ACT inhaler INHALE 2 PUFFS BY MOUTH TWICE DAILY IN THE MORNING AND EVENING   Calcium Carb-Cholecalciferol 600-800 MG-UNIT TABS Take 1 tablet by mouth daily.    Cholecalciferol (VITAMIN D PO) Take 2,000 Units by mouth daily.   cyanocobalamin 1000 MCG tablet Take 1,000 mcg by mouth daily.   denosumab (PROLIA) 60 MG/ML SOSY injection INJECT 60 MG INTO THE SKIN EVERY 6 (SIX) MONTHS.   glucose blood (ACCU-CHEK GUIDE) test strip Use to check blood sugars daily E11.69   hydroxychloroquine (PLAQUENIL) 200 MG tablet TAKE 1 TABLET BY MOUTH DAILY FOR RHEUMATOID ARTHRITIS   Hypromellose (ARTIFICIAL TEARS OP) Apply 1 drop to eye daily as needed (dry eyes).   irbesartan (AVAPRO) 300 MG tablet TAKE 1 TABLET(300 MG) BY MOUTH DAILY   iron polysaccharides (NIFEREX) 150 MG capsule Take 150 mg by mouth daily.   Liniments (BEN GAY EX) Apply 1 application topically daily as needed (pain).   metoprolol succinate (TOPROL-XL) 25 MG 24 hr tablet Take 1 tablet (25 mg total) by mouth daily.   SYNTHROID 88 MCG tablet TAKE 1 TABLET BY MOUTH EVERY DAY MONDAY TO SATURDAY AND OFF ON SUNDAYS   trolamine salicylate (ASPERCREME) 10 % cream Apply 1 application topically daily as needed for muscle pain.   No facility-administered encounter medications on file as of 04/18/2021.   Recent Relevant Labs: Lab Results  Component Value Date/Time   HGBA1C 5.6 12/30/2020 12:54 PM   HGBA1C 5.6 10/24/2020 11:05 AM   MICROALBUR 150 12/30/2020 01:02 PM   MICROALBUR 80 10/31/2019 12:13 PM    Kidney Function Lab Results  Component Value Date/Time  CREATININE 1.10 (H) 04/16/2021 07:29 AM   CREATININE 1.21 (H) 04/08/2021 04:29 PM   CREATININE 1.24 (H) 03/28/2021 03:44 AM   CREATININE 1.37 (H) 12/19/2020 02:26 PM   CREATININE 1.12 (H) 12/02/2020 01:50 PM   CREATININE 1.4 (H) 08/12/2017 08:08 AM   CREATININE 1.2 (H) 02/16/2017 08:29 AM   GFRNONAA 52 (L) 04/16/2021 07:29 AM   GFRNONAA 38 (L) 12/19/2020 02:26 PM    GFRAA 44 (L) 12/19/2020 02:26 PM    Current antihyperglycemic regimen:  None   What recent interventions/DTPs have been made to improve glycemic control:  Educated on A1c and blood sugar goals Exercise goal of 150 minutes per week Prevention and management of hypoglycemic episodes  Have there been any recent hospitalizations or ED visits since last visit with CPP? Yes  Patient denies hypoglycemic symptoms  Patient denies hyperglycemic symptoms  How often are you checking your blood sugar? once daily  What are your blood sugars ranging?  Fasting: 85, 83, 81 Before meals: None After meals: None Bedtime: None  During the week, how often does your blood glucose drop below 70? Never  Are you checking your feet daily/regularly? Patient stated daily  Adherence Review: Is the patient currently on a STATIN medication? Yes Is the patient currently on ACE/ARB medication? Yes Does the patient have >5 day gap between last estimated fill dates? No  NOTES: Patient states she is doing well with her blood sugars. Patient wanted me to call [facility] to follow up on Eliquis and metoprolol. Eliquis copay is now 45 dollars. Patient stated that's what she paid on the 07-12 and wanted to make sure that was the actual amount. Metoprolol can't be filled until 04-22-2021. Patient also wanted me to call [employer] patient assistance to follow up with her Synthroid. Representative informed me that they have an application on file that was sent in 08-2020 but never processed. Application is being processed and representative apologized for the delay. Patient will receive approval letter in about 5 days. Patient states she has about a month supply of Synthroid left.  Care Gaps: Shingrix overdue Medicare wellness 06-25-2021  Star Rating Drugs: Atorvastatin 80 mg- Last filled 01-11-2021 90 DS [facility] Irbesartan 300 mg- Last filled 01-07-2021 90 DS [place] Clinical Pharmacist  Assistant [phone]

## 2021-04-18 NOTE — Telephone Encounter (Signed)
Scheduled follow-up appointments per 7/22 los. Patient is aware.

## 2021-04-21 ENCOUNTER — Other Ambulatory Visit: Payer: Self-pay

## 2021-04-21 ENCOUNTER — Inpatient Hospital Stay: Payer: Medicare HMO

## 2021-04-21 VITALS — BP 145/88 | HR 50 | Temp 98.0°F | Resp 16

## 2021-04-21 DIAGNOSIS — R059 Cough, unspecified: Secondary | ICD-10-CM | POA: Diagnosis not present

## 2021-04-21 DIAGNOSIS — M329 Systemic lupus erythematosus, unspecified: Secondary | ICD-10-CM | POA: Diagnosis not present

## 2021-04-21 DIAGNOSIS — N183 Chronic kidney disease, stage 3 unspecified: Secondary | ICD-10-CM | POA: Diagnosis not present

## 2021-04-21 DIAGNOSIS — D508 Other iron deficiency anemias: Secondary | ICD-10-CM

## 2021-04-21 DIAGNOSIS — R0602 Shortness of breath: Secondary | ICD-10-CM | POA: Diagnosis not present

## 2021-04-21 DIAGNOSIS — E119 Type 2 diabetes mellitus without complications: Secondary | ICD-10-CM | POA: Diagnosis not present

## 2021-04-21 DIAGNOSIS — E785 Hyperlipidemia, unspecified: Secondary | ICD-10-CM | POA: Diagnosis not present

## 2021-04-21 DIAGNOSIS — D649 Anemia, unspecified: Secondary | ICD-10-CM | POA: Diagnosis not present

## 2021-04-21 DIAGNOSIS — I10 Essential (primary) hypertension: Secondary | ICD-10-CM | POA: Diagnosis not present

## 2021-04-21 DIAGNOSIS — I4891 Unspecified atrial fibrillation: Secondary | ICD-10-CM | POA: Diagnosis not present

## 2021-04-21 MED ORDER — ACETAMINOPHEN 325 MG PO TABS
650.0000 mg | ORAL_TABLET | Freq: Once | ORAL | Status: AC
  Administered 2021-04-21: 650 mg via ORAL

## 2021-04-21 MED ORDER — LORATADINE 10 MG PO TABS
10.0000 mg | ORAL_TABLET | Freq: Once | ORAL | Status: AC
  Administered 2021-04-21: 10 mg via ORAL

## 2021-04-21 MED ORDER — ACETAMINOPHEN 325 MG PO TABS
ORAL_TABLET | ORAL | Status: AC
  Filled 2021-04-21: qty 2

## 2021-04-21 MED ORDER — SODIUM CHLORIDE 0.9 % IV SOLN
Freq: Once | INTRAVENOUS | Status: AC
  Filled 2021-04-21: qty 250

## 2021-04-21 MED ORDER — LORATADINE 10 MG PO TABS
ORAL_TABLET | ORAL | Status: AC
  Filled 2021-04-21: qty 1

## 2021-04-21 MED ORDER — SODIUM CHLORIDE 0.9 % IV SOLN
300.0000 mg | Freq: Once | INTRAVENOUS | Status: AC
  Administered 2021-04-21: 300 mg via INTRAVENOUS
  Filled 2021-04-21: qty 300

## 2021-04-21 NOTE — Patient Instructions (Signed)

## 2021-04-24 ENCOUNTER — Telehealth: Payer: Medicare HMO

## 2021-04-24 ENCOUNTER — Telehealth: Payer: Self-pay

## 2021-04-24 DIAGNOSIS — M9905 Segmental and somatic dysfunction of pelvic region: Secondary | ICD-10-CM | POA: Diagnosis not present

## 2021-04-24 DIAGNOSIS — M5136 Other intervertebral disc degeneration, lumbar region: Secondary | ICD-10-CM | POA: Diagnosis not present

## 2021-04-24 DIAGNOSIS — M9902 Segmental and somatic dysfunction of thoracic region: Secondary | ICD-10-CM | POA: Diagnosis not present

## 2021-04-24 DIAGNOSIS — M47814 Spondylosis without myelopathy or radiculopathy, thoracic region: Secondary | ICD-10-CM | POA: Diagnosis not present

## 2021-04-24 DIAGNOSIS — M5137 Other intervertebral disc degeneration, lumbosacral region: Secondary | ICD-10-CM | POA: Diagnosis not present

## 2021-04-24 DIAGNOSIS — M9903 Segmental and somatic dysfunction of lumbar region: Secondary | ICD-10-CM | POA: Diagnosis not present

## 2021-04-24 NOTE — Telephone Encounter (Signed)
  Care Management   Follow Up Note   04/24/2021 Name: Hannah [name] MRN: [id number] DOB: [birth year]   Referred by: Glendale Chard, MD Reason for referral : Chronic Care Management (RN CM Follow up call )   An unsuccessful telephone outreach was attempted today. The patient was referred to the case management team for assistance with care management and care coordination.   Follow Up Plan: Telephone follow up appointment with care management team member scheduled for: 06/27/21  Barb Merino, RN, BSN, CCM Care Management Coordinator [place] [facility]  Direct Phone: [phone]

## 2021-04-25 ENCOUNTER — Encounter: Payer: Self-pay | Admitting: Hematology

## 2021-04-28 ENCOUNTER — Telehealth: Payer: Medicare HMO

## 2021-04-28 ENCOUNTER — Inpatient Hospital Stay: Payer: Medicare HMO | Attending: Hematology

## 2021-04-28 ENCOUNTER — Other Ambulatory Visit: Payer: Self-pay

## 2021-04-28 VITALS — BP 135/57 | HR 63 | Temp 98.3°F | Resp 18 | Wt 160.0 lb

## 2021-04-28 DIAGNOSIS — D649 Anemia, unspecified: Secondary | ICD-10-CM | POA: Insufficient documentation

## 2021-04-28 DIAGNOSIS — D508 Other iron deficiency anemias: Secondary | ICD-10-CM

## 2021-04-28 MED ORDER — LORATADINE 10 MG PO TABS
ORAL_TABLET | ORAL | Status: AC
  Filled 2021-04-28: qty 1

## 2021-04-28 MED ORDER — ACETAMINOPHEN 325 MG PO TABS
ORAL_TABLET | ORAL | Status: AC
  Filled 2021-04-28: qty 2

## 2021-04-28 MED ORDER — ACETAMINOPHEN 325 MG PO TABS
650.0000 mg | ORAL_TABLET | Freq: Once | ORAL | Status: AC
  Administered 2021-04-28: 650 mg via ORAL

## 2021-04-28 MED ORDER — SODIUM CHLORIDE 0.9 % IV SOLN
Freq: Once | INTRAVENOUS | Status: AC
  Filled 2021-04-28: qty 250

## 2021-04-28 MED ORDER — SODIUM CHLORIDE 0.9 % IV SOLN
300.0000 mg | Freq: Once | INTRAVENOUS | Status: AC
  Administered 2021-04-28: 300 mg via INTRAVENOUS
  Filled 2021-04-28: qty 300

## 2021-04-28 MED ORDER — LORATADINE 10 MG PO TABS
10.0000 mg | ORAL_TABLET | Freq: Once | ORAL | Status: AC
  Administered 2021-04-28: 10 mg via ORAL

## 2021-04-28 NOTE — Patient Instructions (Signed)

## 2021-05-05 ENCOUNTER — Inpatient Hospital Stay: Payer: Medicare HMO

## 2021-05-05 ENCOUNTER — Other Ambulatory Visit: Payer: Self-pay

## 2021-05-05 VITALS — BP 140/67 | HR 52 | Temp 97.9°F | Resp 18

## 2021-05-05 DIAGNOSIS — D649 Anemia, unspecified: Secondary | ICD-10-CM | POA: Diagnosis not present

## 2021-05-05 DIAGNOSIS — D508 Other iron deficiency anemias: Secondary | ICD-10-CM

## 2021-05-05 MED ORDER — SODIUM CHLORIDE 0.9 % IV SOLN
300.0000 mg | Freq: Once | INTRAVENOUS | Status: AC
  Administered 2021-05-05: 300 mg via INTRAVENOUS
  Filled 2021-05-05: qty 300

## 2021-05-05 MED ORDER — SODIUM CHLORIDE 0.9 % IV SOLN
Freq: Once | INTRAVENOUS | Status: AC
  Filled 2021-05-05: qty 250

## 2021-05-05 MED ORDER — ACETAMINOPHEN 325 MG PO TABS
650.0000 mg | ORAL_TABLET | Freq: Once | ORAL | Status: AC
  Administered 2021-05-05: 650 mg via ORAL

## 2021-05-05 MED ORDER — LORATADINE 10 MG PO TABS
ORAL_TABLET | ORAL | Status: AC
  Filled 2021-05-05: qty 1

## 2021-05-05 MED ORDER — ACETAMINOPHEN 325 MG PO TABS
ORAL_TABLET | ORAL | Status: AC
  Filled 2021-05-05: qty 2

## 2021-05-05 MED ORDER — LORATADINE 10 MG PO TABS
10.0000 mg | ORAL_TABLET | Freq: Once | ORAL | Status: AC
  Administered 2021-05-05: 10 mg via ORAL

## 2021-05-05 NOTE — Patient Instructions (Signed)

## 2021-05-19 ENCOUNTER — Telehealth: Payer: Self-pay

## 2021-05-19 ENCOUNTER — Other Ambulatory Visit: Payer: Self-pay

## 2021-05-19 MED ORDER — SYNTHROID 88 MCG PO TABS: ORAL_TABLET | ORAL | 0 refills | Status: DC

## 2021-05-19 NOTE — Chronic Care Management (AMB) (Addendum)
Patient called following up with her synthroid medication through [employer] that's was suppose to be processed about a month ago. After waiting 15 minutes for the first representative to look into application her phone disconnected. The second representative stated application was missing front and back copy of patient's insurance card but was received. Representative had to investigate why application wasn't processed. Representative then informed me that patient would need to do the whole application process since RX is over 67 days old. Representative apologized for the confusion and the misleading information given on 04-18-2021. Left patient a voicemail stating we will have to start process all over. When I spoke with patient she stated she is running low on the medication. Patient was able to receive samples of Synthroid. New patient assistance application started for synthroid. Patient is coming to the office 05-29-2021 after 10 to sign application and provide income verification and out of pocket expense.    Hannah [name] was reminded to have all medications, supplements and any blood glucose and blood pressure readings available for review with [name], Pharm. D, at her telephone visit on 05-21-2021 at 3:00.   Questions: Have you had any recent office visit or specialist visit outside of [place]? Patient stated no  Are there any concerns you would like to discuss during your office visit? Patient stated no concerns just getting PAP medication.  Are you having any problems obtaining your medications? (Whether it pharmacy issues or cost) Patient stated no  If patient has any PAP medications ask if they are having any problems getting their PAP medication or refill? Patient stated yes Synthroid 88 mcg.  Care Gaps: RAF= 2.239% Medicare wellness 06-25-2021  Star Rating Drug: Atorvastatin 80 mg- Last filled 04-09-2021 90 DS [facility] Irbesartan 300 mg- Last filled  04-06-2021 90 DS [facility]   Any gaps in medications fill history? No  [place] Pharmacist Assistant [phone]

## 2021-05-21 ENCOUNTER — Telehealth: Payer: Self-pay | Admitting: Pharmacist

## 2021-05-21 ENCOUNTER — Other Ambulatory Visit (HOSPITAL_COMMUNITY): Payer: Self-pay

## 2021-05-21 ENCOUNTER — Ambulatory Visit (INDEPENDENT_AMBULATORY_CARE_PROVIDER_SITE_OTHER): Payer: Medicare HMO

## 2021-05-21 DIAGNOSIS — E782 Mixed hyperlipidemia: Secondary | ICD-10-CM

## 2021-05-21 DIAGNOSIS — M9903 Segmental and somatic dysfunction of lumbar region: Secondary | ICD-10-CM | POA: Diagnosis not present

## 2021-05-21 DIAGNOSIS — M9905 Segmental and somatic dysfunction of pelvic region: Secondary | ICD-10-CM | POA: Diagnosis not present

## 2021-05-21 DIAGNOSIS — I10 Essential (primary) hypertension: Secondary | ICD-10-CM | POA: Diagnosis not present

## 2021-05-21 DIAGNOSIS — M810 Age-related osteoporosis without current pathological fracture: Secondary | ICD-10-CM

## 2021-05-21 DIAGNOSIS — M5136 Other intervertebral disc degeneration, lumbar region: Secondary | ICD-10-CM | POA: Diagnosis not present

## 2021-05-21 DIAGNOSIS — M47814 Spondylosis without myelopathy or radiculopathy, thoracic region: Secondary | ICD-10-CM | POA: Diagnosis not present

## 2021-05-21 DIAGNOSIS — M5137 Other intervertebral disc degeneration, lumbosacral region: Secondary | ICD-10-CM | POA: Diagnosis not present

## 2021-05-21 DIAGNOSIS — M9902 Segmental and somatic dysfunction of thoracic region: Secondary | ICD-10-CM | POA: Diagnosis not present

## 2021-05-21 MED ORDER — DENOSUMAB 60 MG/ML ~~LOC~~ SOSY
60.0000 mg | PREFILLED_SYRINGE | SUBCUTANEOUS | 0 refills | Status: DC
  Filled 2021-05-21 (×2): qty 1, 180d supply, fill #0

## 2021-05-21 NOTE — Progress Notes (Signed)
Chronic Care Management Pharmacy Note  05/27/2021 Name:  Hannah [name] MRN:  [id number] DOB:  [birth year]  Summary: Patient reports that she has been going through a lot since having COVID. She has been feeling fatigued and is worried about her BP.   Recommendations/Changes made from today's visit: Recommended patient be seen by cardiologist, patient reports being more comfortable being seen by PCP team.  Recommend patient replace BP cuff that is greater than 71 years old due to wear and tear and an inaccuracy of the machine.   Plan: Patient to be seen by PCP team this week in office to review BP readings.    Subjective: Hannah [name] is an 74 y.o. year old female who is a primary patient of Glendale Chard, MD.  The CCM team was consulted for assistance with disease management and care coordination needs.    Engaged with patient by telephone for follow up visit in response to provider referral for pharmacy case management and/or care coordination services. Patient reports that she was in the hospital in late June to July. She is currently on Eliquis and Metoprolol Tartrate for atrial fibrillation. Patient reported that she wanted knee replacement but found out that she had anemia from her hematologist. She had to get three iron infusions. She was seeing a physical therapist at [facility] for her back but she reports her back pain has been ongoing.   Consent to Services:  The patient was given information about Chronic Care Management services, agreed to services, and gave verbal consent prior to initiation of services.  Please see initial visit note for detailed documentation.   Patient Care Team: Glendale Chard, MD as PCP - General (Internal Medicine) Nahser, Wonda Cheng, MD as PCP - Cardiology (Cardiology) Rex Kras Claudette Stapler, RN as [place], Kennieth Francois, [facility] (Inactive) (Pharmacist)  Recent office visits: 04/15/2021 PCP OV  Recent consult  visits: 04/16/2021 Cardiology OV 04/08/2021 Cardiology [facility] visits: None in previous 6 months   Objective:  Lab Results  Component Value Date   CREATININE 1.07 (H) 05/23/2021   BUN 16 05/23/2021   GFRNONAA 52 (L) 04/16/2021   GFRAA 44 (L) 12/19/2020   NA 141 05/23/2021   K 4.0 05/23/2021   CALCIUM 9.1 05/23/2021   CO2 31 05/23/2021   GLUCOSE 70 05/23/2021    Lab Results  Component Value Date/Time   HGBA1C 5.6 12/30/2020 12:54 PM   HGBA1C 5.6 10/24/2020 11:05 AM   MICROALBUR 150 12/30/2020 01:02 PM   MICROALBUR 80 10/31/2019 12:13 PM    Last diabetic Eye exam:  Lab Results  Component Value Date/Time   HMDIABEYEEXA No Retinopathy 09/12/2020 12:00 AM    Last diabetic Foot exam: No results found for: HMDIABFOOTEX   Lab Results  Component Value Date   CHOL 153 12/30/2020   HDL 75 12/30/2020   LDLCALC 67 12/30/2020   TRIG 50 12/30/2020   CHOLHDL 2.0 12/30/2020    Hepatic Function Latest Ref Rng & Units 05/23/2021 04/16/2021 03/27/2021  Total Protein 6.1 - 8.1 g/dL 6.2 6.2(L) 5.7(L)  Albumin 3.5 - 5.0 g/dL - 3.3(L) 3.0(L)  AST 10 - 35 U/L 26 17 31   ALT 6 - 29 U/L 27 10 18   Alk Phosphatase 38 - 126 U/L - 43 36(L)  Total Bilirubin 0.2 - 1.2 mg/dL 0.5 0.5 0.4    Lab Results  Component Value Date/Time   TSH 0.690 03/26/2021 02:36 PM   TSH 0.666 12/30/2020 12:54 PM  TSH 1.140 03/20/2020 04:45 PM   FREET4 1.44 12/30/2020 12:54 PM   FREET4 1.52 03/20/2020 04:45 PM    CBC Latest Ref Rng & Units 05/23/2021 04/16/2021 04/08/2021  WBC 3.8 - 10.8 Thousand/uL 4.7 5.5 6.5  Hemoglobin 11.7 - 15.5 g/dL 10.7(L) 9.7(L) 10.9(L)  Hematocrit 35.0 - 45.0 % 32.1(L) 29.5(L) 32.8(L)  Platelets 140 - 400 Thousand/uL 159 183 245    Lab Results  Component Value Date/Time   VD25OH 41 05/23/2021 01:33 PM   VD25OH 50 12/02/2020 01:50 PM    Clinical ASCVD: No  The 10-year ASCVD risk score Mikey Bussing DC Jr., et al., 2013) is: 26.1%   Values used to calculate the score:     Age: 74  years     Sex: Female     Is Non-Hispanic African American: Yes     Diabetic: Yes     Tobacco smoker: No     Systolic Blood Pressure: 425 mmHg     Is BP treated: Yes     HDL Cholesterol: 75 mg/dL     Total Cholesterol: 153 mg/dL    Depression screen [facility] 2/9 06/19/2020 06/19/2020 06/15/2019  Decreased Interest 0 0 0  Down, Depressed, Hopeless 0 0 0  PHQ - 2 Score 0 0 0  Altered sleeping - - 3  Tired, decreased energy - - 3  Change in appetite - - 0  Feeling bad or failure about yourself  - - 0  Trouble concentrating - - 0  Moving slowly or fidgety/restless - - 0  Suicidal thoughts - - 0  PHQ-9 Score - - 6  Difficult doing work/chores - - Not difficult at all  Some recent data might be hidden     CHA2DS2-VASc Score = 6  The patient's score is based upon: CHF History: No HTN History: Yes Diabetes History: Yes Stroke History: No Vascular Disease History: Yes Age Score: 2 Gender Score: 1 {   ASSESSMENT AND PLAN: Paroxysmal Atrial Fibrillation (ICD10:  I48.0) The patient's CHA2DS2-VASc score is 6, indicating a 9.7% annual risk of stroke.   The patient is at significant risk for stroke/thromboembolism based upon her CHA2DS2-VASc Score of 6.  Continue Apixaban (Eliquis).    [name], [facility]    05/27/2021 2:47 PM     Social History   Tobacco Use  Smoking Status Never  Smokeless Tobacco Never   BP Readings from Last 3 Encounters:  05/22/21 116/80  05/05/21 140/67  04/28/21 (!) 135/57   Pulse Readings from Last 3 Encounters:  05/22/21 68  05/05/21 (!) 52  04/28/21 63   Wt Readings from Last 3 Encounters:  05/22/21 157 lb 9.6 oz (71.5 kg)  04/28/21 160 lb (72.6 kg)  04/18/21 157 lb 2 oz (71.3 kg)   BMI Readings from Last 3 Encounters:  05/22/21 33.87 kg/m  04/28/21 34.38 kg/m  04/18/21 33.76 kg/m    Assessment/Interventions: Review of patient past medical history, allergies, medications, health status, including review of consultants  reports, laboratory and other test data, was performed as part of comprehensive evaluation and provision of chronic care management services.   SDOH:  (Social Determinants of Health) assessments and interventions performed: No  SDOH Screenings   Alcohol Screen: Not on file  Depression (PHQ2-9): Low Risk    PHQ-2 Score: 0  Financial Resource Strain: Low Risk    Difficulty of Paying Living Expenses: Not hard at all  Food Insecurity: No Food Insecurity   Worried About Charity fundraiser in  the Last Year: Never true   Ran Out of Food in the Last Year: Never true  Housing: Not on file  Physical Activity: Sufficiently Active   Days of Exercise per Week: 4 days   Minutes of Exercise per Session: 40 min  Social Connections: Not on file  Stress: Stress Concern Present   Feeling of Stress : To some extent  Tobacco Use: Low Risk    Smoking Tobacco Use: Never   Smokeless Tobacco Use: Never  Transportation Needs: No Transportation Needs   Lack of Transportation (Medical): No   Lack of Transportation (Non-Medical): No    CCM Care Plan  Allergies  Allergen Reactions   Shellfish Allergy Anaphylaxis and Hives   Shellfish-Derived Products Anaphylaxis    Medications Reviewed Today     Reviewed by Minette Brine, FNP (Family Nurse Practitioner) on 05/22/21 at [place] List Status: <None>   Medication Order Taking? Sig Documenting Provider Last Dose Status Informant  Accu-Chek Softclix Lancets lancets [id number] Yes Use to check blood sugars daily E11.69 Glendale Chard, MD Taking Active Self  albuterol (PROVENTIL HFA;VENTOLIN HFA) 108 (90 Base) MCG/ACT inhaler [id number] Yes Inhale 2 puffs into the lungs every 6 (six) hours as needed for wheezing or shortness of breath. Minette Brine, FNP Taking Active Self  Alcohol Swabs (ALCOHOL PADS) 70 % PADS [id number] Yes Use as directed to check blood sugars 1 time per day dx: e11.22 Glendale Chard, MD Taking Active Self  allopurinol (ZYLOPRIM) 100 MG  tablet [id number] Yes TAKE 1 TABLET BY MOUTH EVERY DAY FOR GOUT Glendale Chard, MD Taking Active   amLODipine (NORVASC) 2.5 MG tablet [id number] Yes TAKE 1 TABLET(2.5 MG) BY MOUTH DAILY Glendale Chard, MD Taking Active Self  apixaban (ELIQUIS) 5 MG TABS tablet [id number] Yes Take 1 tablet (5 mg total) by mouth 2 (two) times daily. Nahser, Wonda Cheng, MD Taking Active   Ascorbic Acid (VITAMIN C) 1000 MG tablet [id number] Yes Take 1,000 mg by mouth daily. [provider] Taking Active Self  atorvastatin (LIPITOR) 80 MG tablet [id number] Yes TAKE 1 TABLET BY MOUTH EVERY DAY Glendale Chard, MD Taking Active   budesonide-formoterol [facility]) 160-4.5 MCG/ACT inhaler [id number] Yes INHALE 2 PUFFS BY MOUTH TWICE DAILY IN THE MORNING AND Fredric Mare, MD Taking Active Self  Calcium Carb-Cholecalciferol 600-800 MG-UNIT TABS [id number] Yes Take 1 tablet by mouth daily.  [provider] Taking Active Self  Cholecalciferol (VITAMIN D PO) [id number] Yes Take 2,000 Units by mouth daily. [provider] Taking Active   cyanocobalamin 1000 MCG tablet [id number] Yes Take 1,000 mcg by mouth daily. [provider] Taking Active Self  denosumab (PROLIA) 60 MG/ML SOSY injection [id number] Yes Inject 60 mg into the skin every 6 (six) months. Appt on 06/07/21 Ofilia Neas, PA-C Taking Active   glucose blood (ACCU-CHEK GUIDE) test strip [id number] Yes Use to check blood sugars daily E11.69 Glendale Chard, MD Taking Active Self  hydroxychloroquine (PLAQUENIL) 200 MG tablet [id number] Yes TAKE 1 TABLET BY MOUTH DAILY FOR RHEUMATOID ARTHRITIS Bo Merino, MD Taking Active Self  Hypromellose (ARTIFICIAL TEARS OP) [id number] Yes Apply 1 drop to eye daily as needed (dry eyes). [provider] Taking Active Self  irbesartan (AVAPRO) 300 MG tablet [id number] Yes TAKE 1 TABLET(300 MG) BY MOUTH DAILY Glendale Chard, MD Taking Active Self  iron polysaccharides (NIFEREX) 150 MG capsule  [id number] Yes Take 150 mg by mouth daily. [provider] Taking Active Self  Med Note (LITTLE, ANGEL L   Fri Nov 01, 2020  1:05 PM)    [facility]) [id number] Yes Apply 1 application topically daily as needed (pain). [provider] Taking Active Self  metoprolol succinate (TOPROL-XL) 25 MG 24 hr tablet [id number] Yes Take 1 tablet (25 mg total) by mouth daily. Nahser, Wonda Cheng, MD Taking Active   SYNTHROID 88 MCG tablet [id number] Yes TAKE 1 TABLET BY MOUTH EVERY DAY MONDAY TO SATURDAY AND OFF ON Nemiah Commander, MD Taking Active   trolamine salicylate (ASPERCREME) 10 % cream [id number] Yes Apply 1 application topically daily as needed for muscle pain. [provider] Taking Active Self            Patient Active Problem List   Diagnosis Date Noted   Atrial fibrillation ([place]) 04/16/2021   Demand ischemia ([place]) 03/26/2021   Unspecified atrial fibrillation ([place]) 03/26/2021   COVID-19 virus infection 03/26/2021   PVC (premature ventricular contraction) 05/06/2020   Type 2 diabetes mellitus with stage 3 chronic kidney disease, without long-term current use of insulin ([place]) 03/20/2020   Palpitations 03/20/2020   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/2020   Primary hypothyroidism 03/20/2020   Vitiligo 03/20/2020   Iron deficiency anemia 08/12/2017   Osteoporosis 10/02/2016   Systemic lupus erythematosus ([place]) 10/02/2016   Rheumatoid arthritis involving multiple sites with positive rheumatoid factor ([place]) 10/02/2016   High risk medication use 10/02/2016   History of chronic kidney disease 10/02/2016   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/2016   Primary osteoarthritis of both knees 10/02/2016   Vitamin D deficiency 10/02/2016   History of diabetes mellitus 10/02/2016   History of hypertension 10/02/2016   History of asthma 10/02/2016   Absolute anemia    MGUS (monoclonal gammopathy of unknown significance)     Normocytic anemia 03/21/2015   Essential hypertension 03/20/2015   Abnormal CT of the chest 07/30/2012   Asthma 06/19/2012    Immunization History  Administered Date(s) Administered   19-influenza Whole 07/15/2012   DTaP 06/18/2019   Fluad Quad(high Dose 65+) 06/19/2020   Influenza Split 06/28/2014   Influenza, High Dose Seasonal PF 05/18/2019   Influenza,inj,Quad PF,6+ Mos 06/20/2018   Moderna SARS-COV2 Booster Vaccination 01/01/2021   Moderna Sars-Covid-2 Vaccination 11/20/2019, 12/19/2019, 08/06/2020   Pneumococcal Conjugate-13 05/04/2018   Pneumococcal Polysaccharide-23 05/18/2019   Pneumococcal-Unspecified 06/28/2014   Tdap 06/15/2019   Zoster Recombinat (Shingrix) 04/24/2021    Conditions to be addressed/monitored:  Hypertension and Diabetes  Care Plan : [place]  Updates made by Mayford Knife, [place] since 05/27/2021 12:00 AM     Problem: HTN, DM II   Priority: High     Long-Range Goal: Disease Management   Recent Progress: On track  Priority: High  Note:   Current Barriers:  Unable to independently monitor therapeutic efficacy  Pharmacist Clinical Goal(s):  Patient will achieve adherence to monitoring guidelines and medication adherence to achieve therapeutic efficacy through collaboration with PharmD and provider.   Interventions: 1:1 collaboration with Glendale Chard, MD regarding development and update of comprehensive plan of care as evidenced by provider attestation and co-signature Inter-disciplinary care team collaboration (see longitudinal plan of care) Comprehensive medication review performed; medication list updated in electronic medical record  Hypertension (BP goal <130/80) -Controlled -Current treatment: Amlodipine 2.5 mg tablet taking it once a day Irbesartan 300 mg tablet once per day  Metoprolol succinate 25 mg taking 1 tablet by mouth daily  -Medications previously tried: Amlodipine 5 mg   -  Current home readings: 156/92  pulse: 55, 157/94 pulse: 54  -Patient reports that she has had a BP that is older then a year old, I recommended  -Current dietary habits: patient reports that she is eating well  -Current exercise habits: started back two weeks ago with yoga and silver sneakers and yoga -Denies hypotensive/hypertensive symptoms -Educated on Importance of home blood pressure monitoring; Proper BP monitoring technique; -Counseled to monitor BP at home everyday, document, and provide log at future appointments -Recommended to continue current medication  Diabetes (A1c goal <7%) -Controlled -Current medications: Not currently taking any medication  -Medications previously tried: Metformin 500 mg   -Current home glucose readings fasting glucose: 109, 88, 85, 90 -Denies hypoglycemic/hyperglycemic symptoms -Current meal patterns: eating a lot of vegetables, a lot of fish, avoiding red meat   -Current exercise: equated information  -Educated on A1c and blood sugar goals; Counseled to check feet daily and get yearly eye exams -Counseled to check feet daily and get yearly eye exams -Counseled on diet and exercise extensively   Patient Goals/Self-Care Activities Patient will:  - take medications as prescribed  Follow Up Plan: The patient has been provided with contact information for the care management team and has been advised to call with any health related questions or concerns.       Compliance/Adherence/Medication fill history: Care Gaps: Influenza Vaccine  Star-Rating Drugs: Atorvastatin 80 mg tablet  Irbersartan 300 mg tablet  Patient's preferred pharmacy is:  [id number] [place], [place] - [address] AT [address] [place] [address] Phone: [phone] Fax: [place] [address] Phone: [phone] Fax: [phone]  [facility] [id number] -  [place] [place] [place] [address] Phone: [phone] Fax: [phone]  [facility] [address] [place] [address] Phone: [phone] Fax: [phone]  Uses pill box? Yes Pt endorses 90% compliance  We discussed: Benefits of medication synchronization, packaging and delivery as well as enhanced pharmacist oversight with [facility]. Patient decided to: Continue current medication management strategy  Care Plan and Follow Up Patient Decision:  Patient agrees to Care Plan and Follow-up.  Plan: The patient has been provided with contact information for the care management team and has been advised to call with any health related questions or concerns.   [name], PharmD Clinical Pharmacist [facility] [phone]

## 2021-05-21 NOTE — Telephone Encounter (Signed)
Delivery instructions updated in [place]. Medication scheduled to be filled on 8/29 and delivered to clinic prior to patient's 9/16 appointment.

## 2021-05-21 NOTE — Telephone Encounter (Signed)
Patient due for Prolia on or after 06/07/21. Last Prolia was on 12/09/20  She has appointment with Dr. Estanislado Pandy on 06/13/21.  DEXA updated on 08/09/20: Total right hip BMD 0.660 with T score -2.4.  Statistically significant increase in BMD of lumbar spine and right femoral neck.  Left femoral neck stable. Due to repeat DEXA in November 2023.   She can have labs completed prior to her appt and Prolia can be couriered from [facility] to clinic. Future order for CBC, CMP, and Vitamin D remain in place. Patient states she will come on Friay, 05/23/21 for Prolia labs. She is okay with receiving Prolia at [facility] on 06/13/21  Rx sent to [facility]. Patient has grant approved thrugh 10/06/21. Routing to [place] [facility] to assist with coordinating med to clinic  Knox Saliva, PharmD, MPH, BCPS Clinical Pharmacist (Rheumatology and Pulmonology)

## 2021-05-22 ENCOUNTER — Encounter: Payer: Self-pay | Admitting: Nurse Practitioner

## 2021-05-22 ENCOUNTER — Other Ambulatory Visit: Payer: Self-pay

## 2021-05-22 ENCOUNTER — Ambulatory Visit (INDEPENDENT_AMBULATORY_CARE_PROVIDER_SITE_OTHER): Payer: Medicare HMO | Admitting: Nurse Practitioner

## 2021-05-22 VITALS — BP 116/80 | HR 68 | Temp 97.9°F | Ht <= 58 in | Wt 157.6 lb

## 2021-05-22 DIAGNOSIS — I129 Hypertensive chronic kidney disease with stage 1 through stage 4 chronic kidney disease, or unspecified chronic kidney disease: Secondary | ICD-10-CM

## 2021-05-22 DIAGNOSIS — Z6833 Body mass index (BMI) 33.0-33.9, adult: Secondary | ICD-10-CM | POA: Diagnosis not present

## 2021-05-22 DIAGNOSIS — E6609 Other obesity due to excess calories: Secondary | ICD-10-CM

## 2021-05-22 NOTE — Patient Instructions (Signed)

## 2021-05-22 NOTE — Progress Notes (Signed)
I,Yamilka Roman [employer] as a Education administrator for [employer], FNP.,have documented all relevant documentation on the behalf of Minette Brine, FNP,as directed by  Minette Brine, FNP while in the presence of Minette Brine, [place].   This visit occurred during the SARS-CoV-2 public health emergency.  Safety protocols were in place, including screening questions prior to the visit, additional usage of staff PPE, and extensive cleaning of exam room while observing appropriate contact time as indicated for disinfecting solutions.  Subjective:     Patient ID: Hannah [name] , female    DOB: [birth year] , 75 y.o.   MRN: [id number]   Chief Complaint  Patient presents with   Hypertension    HPI  Patient presents today for a bp f/u. Reports her blood pressure was elevated yesterday. She sees Dr. Tarri Glenn (Cardiology) She reports compliance with her meds. No changes in her diet.   She had been receiving dry needling at [facility]. May schedule appt due to still having back pain She has had 3 iron infusions in the last couple months  Wt Readings from Last 3 Encounters: 05/22/21 : 157 lb 9.6 oz (71.5 kg) 04/28/21 : 160 lb (72.6 kg) 04/18/21 : 157 lb 2 oz (71.3 kg)    Hypertension This is a chronic problem. The current episode started more than 1 year ago. The problem is unchanged. The problem is controlled. There are no associated agents to hypertension. Risk factors for coronary artery disease include sedentary lifestyle and obesity. Past treatments include calcium channel blockers and angiotensin blockers. There are no compliance problems.  There is no history of angina. There is no history of chronic renal disease.    Past Medical History:  Diagnosis Date   Anemia    Arthritis    Asthma    Atrial fibrillation ([place])    Diabetes mellitus without complication ([place])    Gout 12/17/2014   patient reported   Hyperlipidemia    Hypertension    Systemic lupus erythematosus ([place])    Vitiligo       Family History  Problem Relation Age of Onset   Heart disease Father    Diabetes Father    Hypertension Mother    Hypertension Sister    Hypertension Sister    Leukemia Sister      Current Outpatient Medications:    Accu-Chek Softclix Lancets lancets, Use to check blood sugars daily E11.69, Disp: 100 each, Rfl: 2   albuterol (PROVENTIL HFA;VENTOLIN HFA) 108 (90 Base) MCG/ACT inhaler, Inhale 2 puffs into the lungs every 6 (six) hours as needed for wheezing or shortness of breath., Disp: 1 Inhaler, Rfl: 2   Alcohol Swabs (ALCOHOL PADS) 70 % PADS, Use as directed to check blood sugars 1 time per day dx: e11.22, Disp: 150 each, Rfl: 2   allopurinol (ZYLOPRIM) 100 MG tablet, TAKE 1 TABLET BY MOUTH EVERY DAY FOR GOUT, Disp: 90 tablet, Rfl: 1   amLODipine (NORVASC) 2.5 MG tablet, TAKE 1 TABLET(2.5 MG) BY MOUTH DAILY, Disp: 30 tablet, Rfl: 11   apixaban (ELIQUIS) 5 MG TABS tablet, Take 1 tablet (5 mg total) by mouth 2 (two) times daily., Disp: 180 tablet, Rfl: 3   Ascorbic Acid (VITAMIN C) 1000 MG tablet, Take 1,000 mg by mouth daily., Disp: , Rfl:    atorvastatin (LIPITOR) 80 MG tablet, TAKE 1 TABLET BY MOUTH EVERY DAY, Disp: 90 tablet, Rfl: 1   budesonide-formoterol (SYMBICORT) 160-4.5 MCG/ACT inhaler, INHALE 2 PUFFS BY MOUTH TWICE DAILY IN THE MORNING AND EVENING (Patient taking  differently: as needed. INHALE 2 PUFFS BY MOUTH TWICE DAILY IN THE MORNING AND EVENING), Disp: 10.2 g, Rfl: 2   Calcium Carb-Cholecalciferol 600-800 MG-UNIT TABS, Take 1 tablet by mouth daily. , Disp: , Rfl:    Cholecalciferol (VITAMIN D PO), Take 2,000 Units by mouth daily., Disp: , Rfl:    cyanocobalamin 1000 MCG tablet, Take 1,000 mcg by mouth daily., Disp: , Rfl:    denosumab (PROLIA) 60 MG/ML SOSY injection, Inject 60 mg into the skin every 6 (six) months. Appt on 06/07/21, Disp: 1 mL, Rfl: 0   glucose blood (ACCU-CHEK GUIDE) test strip, Use to check blood sugars daily E11.69, Disp: 100 each, Rfl: 2    Hypromellose (ARTIFICIAL TEARS OP), Apply 1 drop to eye daily as needed (dry eyes)., Disp: , Rfl:    irbesartan (AVAPRO) 300 MG tablet, TAKE 1 TABLET(300 MG) BY MOUTH DAILY, Disp: 90 tablet, Rfl: 2   iron polysaccharides (NIFEREX) 150 MG capsule, Take 150 mg by mouth daily. (Patient not taking: Reported on 06/13/2021), Disp: , Rfl:    Liniments (BEN GAY EX), Apply 1 application topically daily as needed (pain)., Disp: , Rfl:    metoprolol succinate (TOPROL-XL) 25 MG 24 hr tablet, Take 1 tablet (25 mg total) by mouth daily., Disp: 90 tablet, Rfl: 3   SYNTHROID 88 MCG tablet, TAKE 1 TABLET BY MOUTH EVERY DAY MONDAY TO SATURDAY AND OFF ON SUNDAYS, Disp: 90 tablet, Rfl: 0   trolamine salicylate (ASPERCREME) 10 % cream, Apply 1 application topically daily as needed for muscle pain., Disp: , Rfl:    hydroxychloroquine (PLAQUENIL) 200 MG tablet, Take 1 tablet (200 mg total) by mouth daily., Disp: 90 tablet, Rfl: 0   Allergies  Allergen Reactions   Shellfish Allergy Anaphylaxis and Hives   Shellfish-Derived Products Anaphylaxis     Review of Systems  Constitutional: Negative.   Respiratory: Negative.    Cardiovascular: Negative.   Neurological: Negative.   Psychiatric/Behavioral: Negative.      Today's Vitals   05/22/21 0856  BP: 116/80  Pulse: 68  Temp: 97.9 F (36.6 C)  Weight: 157 lb 9.6 oz (71.5 kg)  Height: 4' 9.2" (1.453 m)  PainSc: 5   PainLoc: Back   Body mass index is 33.87 kg/m.   Objective:  Physical Exam Vitals reviewed.  Constitutional:      General: She is not in acute distress.    Appearance: Normal appearance.  Cardiovascular:     Rate and Rhythm: Normal rate and regular rhythm.     Pulses: Normal pulses.     Heart sounds: Normal heart sounds. No murmur heard. Pulmonary:     Effort: Pulmonary effort is normal. No respiratory distress.     Breath sounds: Normal breath sounds. No wheezing.  Musculoskeletal:     Right lower leg: No edema.     Left lower leg: No  edema.  Neurological:     General: No focal deficit present.     Mental Status: She is alert and oriented to person, place, and time.     Cranial Nerves: No cranial nerve deficit.     Motor: No weakness.  Psychiatric:        Mood and Affect: Mood normal.        Behavior: Behavior normal.        Thought Content: Thought content normal.        Judgment: Judgment normal.        Assessment And Plan:     1. Hypertensive nephropathy  Comments: Her blood pressure is better controlled today, no changes.   2. Class 1 obesity due to excess calories with body mass index (BMI) of 33.0 to 33.9 in adult, unspecified whether serious comorbidity present Comments: Encouraged to continue with regular exercise and eating a healthy diet    Patient was given opportunity to ask questions. Patient verbalized understanding of the plan and was able to repeat key elements of the plan. All questions were answered to their satisfaction.  Minette Brine, FNP   I, Minette Brine, FNP, have reviewed all documentation for this visit. The documentation on 05/22/21 for the exam, diagnosis, procedures, and orders are all accurate and complete.   IF YOU HAVE BEEN REFERRED TO A SPECIALIST, IT MAY TAKE 1-2 WEEKS TO SCHEDULE/PROCESS THE REFERRAL. IF YOU HAVE NOT HEARD FROM US/SPECIALIST IN TWO WEEKS, PLEASE GIVE [place] A CALL AT [phone].   THE PATIENT IS ENCOURAGED TO PRACTICE SOCIAL DISTANCING DUE TO THE COVID-19 PANDEMIC.

## 2021-05-23 ENCOUNTER — Other Ambulatory Visit: Payer: Self-pay | Admitting: *Deleted

## 2021-05-23 ENCOUNTER — Telehealth: Payer: Self-pay

## 2021-05-23 ENCOUNTER — Other Ambulatory Visit (HOSPITAL_COMMUNITY): Payer: Self-pay

## 2021-05-23 ENCOUNTER — Encounter: Payer: Self-pay | Admitting: Hematology

## 2021-05-23 DIAGNOSIS — M810 Age-related osteoporosis without current pathological fracture: Secondary | ICD-10-CM

## 2021-05-23 DIAGNOSIS — Z8639 Personal history of other endocrine, nutritional and metabolic disease: Secondary | ICD-10-CM

## 2021-05-23 DIAGNOSIS — E559 Vitamin D deficiency, unspecified: Secondary | ICD-10-CM | POA: Diagnosis not present

## 2021-05-23 DIAGNOSIS — Z79899 Other long term (current) drug therapy: Secondary | ICD-10-CM | POA: Diagnosis not present

## 2021-05-23 LAB — COMPLETE METABOLIC PANEL WITH GFR
AG Ratio: 1.6 (calc) (ref 1.0–2.5)
ALT: 27 U/L (ref 6–29)
AST: 26 U/L (ref 10–35)
Albumin: 3.8 g/dL (ref 3.6–5.1)
Alkaline phosphatase (APISO): 51 U/L (ref 37–153)
BUN/Creatinine Ratio: 15 (calc) (ref 6–22)
BUN: 16 mg/dL (ref 7–25)
CO2: 31 mmol/L (ref 20–32)
Calcium: 9.1 mg/dL (ref 8.6–10.4)
Chloride: 105 mmol/L (ref 98–110)
Creat: 1.07 mg/dL — ABNORMAL HIGH (ref 0.60–1.00)
Globulin: 2.4 g/dL (calc) (ref 1.9–3.7)
Glucose, Bld: 70 mg/dL (ref 65–99)
Potassium: 4 mmol/L (ref 3.5–5.3)
Sodium: 141 mmol/L (ref 135–146)
Total Bilirubin: 0.5 mg/dL (ref 0.2–1.2)
Total Protein: 6.2 g/dL (ref 6.1–8.1)
eGFR: 54 mL/min/{1.73_m2} — ABNORMAL LOW (ref >=60)

## 2021-05-23 LAB — CBC WITH DIFFERENTIAL/PLATELET
Absolute Monocytes: 428 cells/uL (ref 200–950)
Basophils Absolute: 52 cells/uL (ref 0–200)
Basophils Relative: 1.1 %
Eosinophils Absolute: 390 cells/uL (ref 15–500)
Eosinophils Relative: 8.3 %
HCT: 32.1 % — ABNORMAL LOW (ref 35.0–45.0)
Hemoglobin: 10.7 g/dL — ABNORMAL LOW (ref 11.7–15.5)
Lymphs Abs: 912 cells/uL (ref 850–3900)
MCH: 32.3 pg (ref 27.0–33.0)
MCHC: 33.3 g/dL (ref 32.0–36.0)
MCV: 97 fL (ref 80.0–100.0)
MPV: 12.3 fL (ref 7.5–12.5)
Monocytes Relative: 9.1 %
Neutro Abs: 2919 cells/uL (ref 1500–7800)
Neutrophils Relative %: 62.1 %
Platelets: 159 10*3/uL (ref 140–400)
RBC: 3.31 10*6/uL — ABNORMAL LOW (ref 3.80–5.10)
RDW: 14.5 % (ref 11.0–15.0)
Total Lymphocyte: 19.4 %
WBC: 4.7 10*3/uL (ref 3.8–10.8)

## 2021-05-23 LAB — VITAMIN D 25 HYDROXY (VIT D DEFICIENCY, FRACTURES): Vit D, 25-Hydroxy: 41 ng/mL (ref 30–100)

## 2021-05-23 NOTE — Telephone Encounter (Signed)
Returned call to patient stating that I was unaware of her Hannah [name] that is currently active, as the information was not transferred over when the outpatient pharmacy made the switch from Rx30 to [facility]. Information has been added and pt's copay is now $0, drug will ship to clinic as planned. Patient notified and nothing further is needed at this time.

## 2021-05-23 NOTE — Telephone Encounter (Signed)
Patient called stating she received a call from [facility] letting her know she needs to pay $118.84 before they will ship her Prolia medication to the office.   Patient states she doesn't remember having to pay a co-pay in the past.  Patient requested a return call.

## 2021-05-25 NOTE — Progress Notes (Signed)
Anemia is a stable.  Creatinine is elevated and stable.  Vitamin D is normal.

## 2021-05-26 ENCOUNTER — Other Ambulatory Visit (HOSPITAL_COMMUNITY): Payer: Self-pay

## 2021-05-26 ENCOUNTER — Telehealth: Payer: Self-pay | Admitting: Cardiovascular Disease

## 2021-05-26 NOTE — Telephone Encounter (Signed)
Patient will receive Prolia at her appointment on 06/13/2021

## 2021-05-26 NOTE — Telephone Encounter (Signed)
Will route this message to our Prior Auth Nurse and PharmD team, to see if they can further assist the pt with some kind of pt assistance program for Eliquis, or help lower the cost of this medication for the pt.

## 2021-05-26 NOTE — Telephone Encounter (Signed)
**Note De-Identified Hillari Zumwalt Obfuscation** The pt states that the cost of her Eliquis has gone up in cost to $140/30 day supply and that she is in her donut hole. She did purchase a 30 day supply of Eliquis at this cost so she is not currently out.  We discussed pt asst for Eliquis through [employer] and she is interested as she is aware that if approved she will receive her Eliquis free of charge from them for the remainder of this year.  I gave her their phone number and advised her to contact them to ask questions about their Eliquis program and her eligibility to be approved for the program. She is aware that if it appears that she would be approved for the Eliquis program to ask [employer] to mail her an application to her home address.  She is also aware that once she receives her application to complete her part, obtain required documents per [employer], and to bring all to Dr Elmarie Shiley office at [facility] on [address] in [place] to drop off in the front office and that we will take care of the provider page and will fax all to [employer].  She thanked me for calling her back and is aware to call me if she is not eligible for asst with Eliquis so we can discuss switching to another anticoagulant that she can better afford.

## 2021-05-26 NOTE — Telephone Encounter (Signed)
Pt c/o medication issue:  1. Name of Medication: apixaban (ELIQUIS) 5 MG TABS tablet (Expired)  2. How are you currently taking this medication (dosage and times per day)? 1 tablet in the morning and 1 at night  3. Are you having a reaction (difficulty breathing--STAT)? no  4. What is your medication issue? Patient called to see if there any programs that would help her get this medication she need. Please advise

## 2021-05-26 NOTE — Telephone Encounter (Signed)
Prolia received in the office from [facility]. Placed in [place] #2. Thanks!

## 2021-05-26 NOTE — Telephone Encounter (Signed)
Patient's CBC, CMP, and Vitamin D drawn on 05/23/21 wnl to proceed with Prolia at office visit on 06/13/21.  Routing to [employer] as Juluis Rainier.  Rx already to [facility] and expected to courier before clinic visit  Knox Saliva, PharmD, MPH, BCPS Clinical Pharmacist (Rheumatology and Pulmonology)

## 2021-05-27 NOTE — Patient Instructions (Signed)
Visit Information It was great speaking with you today!  Please let me know if you have any questions about our visit.   Goals Addressed   None     Patient Care Plan: CCM Pharmacy Care Plan     Problem Identified: HTN, DM II   Priority: High     Long-Range Goal: Disease Management   Recent Progress: On track  Priority: High  Note:   Current Barriers:  Unable to independently monitor therapeutic efficacy  Pharmacist Clinical Goal(s):  Patient will achieve adherence to monitoring guidelines and medication adherence to achieve therapeutic efficacy through collaboration with PharmD and provider.   Interventions: 1:1 collaboration with Glendale Chard, MD regarding development and update of comprehensive plan of care as evidenced by provider attestation and co-signature Inter-disciplinary care team collaboration (see longitudinal plan of care) Comprehensive medication review performed; medication list updated in electronic medical record  Hypertension (BP goal <130/80) -Controlled -Current treatment: Amlodipine 2.5 mg tablet taking it once a day Irbesartan 300 mg tablet once per day  Metoprolol succinate 25 mg taking 1 tablet by mouth daily  -Medications previously tried: Amlodipine 5 mg   -Current home readings: 156/92 pulse: 55, 157/94 pulse: 54  -Patient reports that she has had a BP that is older then a year old, I recommended  -Current dietary habits: patient reports that she is eating well  -Current exercise habits: started back two weeks ago with yoga and silver sneakers and yoga -Denies hypotensive/hypertensive symptoms -Educated on Importance of home blood pressure monitoring; Proper BP monitoring technique; -Counseled to monitor BP at home everyday, document, and provide log at future appointments -Recommended to continue current medication  Diabetes (A1c goal <7%) -Controlled -Current medications: Not currently taking any medication  -Medications previously  tried: Metformin 500 mg   -Current home glucose readings fasting glucose: 109, 88, 85, 90 -Denies hypoglycemic/hyperglycemic symptoms -Current meal patterns: eating a lot of vegetables, a lot of fish, avoiding red meat   -Current exercise: equated information  -Educated on A1c and blood sugar goals; Counseled to check feet daily and get yearly eye exams -Counseled to check feet daily and get yearly eye exams -Counseled on diet and exercise extensively   Patient Goals/Self-Care Activities Patient will:  - take medications as prescribed  Follow Up Plan: The patient has been provided with contact information for the care management team and has been advised to call with any health related questions or concerns.        Patient agreed to services and verbal consent obtained.   The patient verbalized understanding of instructions, educational materials, and care plan provided today and agreed to receive a mailed copy of patient instructions, educational materials, and care plan.   [name], PharmD Clinical Pharmacist [facility] [phone]

## 2021-05-29 ENCOUNTER — Telehealth: Payer: Self-pay

## 2021-05-29 NOTE — Chronic Care Management (AMB) (Signed)
    Chronic Care Management Pharmacy Assistant   Name: Hannah [name]  MRN: [id number] DOB: [birth year]  Reason for Encounter: Patient Assistance Coordination  05/29/2021- Patient arrived to the office to sign patient assistance application for Synthroid with [facility] Assist patient assistance program. Patient brought income documentation and out of pocked prescription expense report. Awaiting provider signature to fax. While in the office patient had questions regarding the ActX Genotype Study being performed and recommended by Dr. Baird Cancer and [name], Pharm D. Answered all of patient questions and but she would like a little more time to think on if she would like to participate in the study.   06/06/2021- Fax received on 06/03/2021 patient approved for Synthroid with [facility] assist and will receive medication in 7-10 business days. Called patient to notify, no answer left message to return call. Patient called 06/05/2021 able to listen to voicemail and she is also aware of approval. Patient assistance process is complete.   Medications: Outpatient Encounter Medications as of 05/29/2021  Medication Sig   Accu-Chek Softclix Lancets lancets Use to check blood sugars daily E11.69   albuterol (PROVENTIL HFA;VENTOLIN HFA) 108 (90 Base) MCG/ACT inhaler Inhale 2 puffs into the lungs every 6 (six) hours as needed for wheezing or shortness of breath.   Alcohol Swabs (ALCOHOL PADS) 70 % PADS Use as directed to check blood sugars 1 time per day dx: e11.22   allopurinol (ZYLOPRIM) 100 MG tablet TAKE 1 TABLET BY MOUTH EVERY DAY FOR GOUT   amLODipine (NORVASC) 2.5 MG tablet TAKE 1 TABLET(2.5 MG) BY MOUTH DAILY   apixaban (ELIQUIS) 5 MG TABS tablet Take 1 tablet (5 mg total) by mouth 2 (two) times daily.   Ascorbic Acid (VITAMIN C) 1000 MG tablet Take 1,000 mg by mouth daily.   atorvastatin (LIPITOR) 80 MG tablet TAKE 1 TABLET BY MOUTH EVERY DAY   budesonide-formoterol (SYMBICORT) 160-4.5 MCG/ACT inhaler  INHALE 2 PUFFS BY MOUTH TWICE DAILY IN THE MORNING AND EVENING   Calcium Carb-Cholecalciferol 600-800 MG-UNIT TABS Take 1 tablet by mouth daily.    Cholecalciferol (VITAMIN D PO) Take 2,000 Units by mouth daily.   cyanocobalamin 1000 MCG tablet Take 1,000 mcg by mouth daily.   denosumab (PROLIA) 60 MG/ML SOSY injection Inject 60 mg into the skin every 6 (six) months. Appt on 06/07/21   glucose blood (ACCU-CHEK GUIDE) test strip Use to check blood sugars daily E11.69   hydroxychloroquine (PLAQUENIL) 200 MG tablet TAKE 1 TABLET BY MOUTH DAILY FOR RHEUMATOID ARTHRITIS   Hypromellose (ARTIFICIAL TEARS OP) Apply 1 drop to eye daily as needed (dry eyes).   irbesartan (AVAPRO) 300 MG tablet TAKE 1 TABLET(300 MG) BY MOUTH DAILY   iron polysaccharides (NIFEREX) 150 MG capsule Take 150 mg by mouth daily.   Liniments (BEN GAY EX) Apply 1 application topically daily as needed (pain).   metoprolol succinate (TOPROL-XL) 25 MG 24 hr tablet Take 1 tablet (25 mg total) by mouth daily.   SYNTHROID 88 MCG tablet TAKE 1 TABLET BY MOUTH EVERY DAY MONDAY TO SATURDAY AND OFF ON SUNDAYS   trolamine salicylate (ASPERCREME) 10 % cream Apply 1 application topically daily as needed for muscle pain.   No facility-administered encounter medications on file as of 05/29/2021.   Pattricia Boss, [place] Pharmacist Assistant [phone]

## 2021-05-30 NOTE — Progress Notes (Signed)
Office Visit Note  Patient: Hannah [name]             Date of Birth: March 09, 1946           MRN: [id number]             PCP: Glendale Chard, MD Referring: Glendale Chard, MD Visit Date: 06/13/2021 Occupation: '@GUAROCC'$ @  Subjective:  Pain in knee joints.   History of Present Illness: Hannah [name] is a 75 y.o. female with a history of seropositive rheumatoid arthritis, plus, osteoarthritis and osteoporosis.  She states she had COVID-19 infection in June.  She was hospitalized soon after that due to hypertension.  She was on the hospital for couple of days and was diagnosed with atrial fibrillation.  She has been on Eliquis since then.  She is also wanting to have knee replacement surgery but she was found to be anemic and is getting iron infusions.  She continues to have pain and discomfort in her bilateral knee joints.  The plan was to have left total knee replacement.  Activities of Daily Living:  Patient reports morning stiffness for 30-45 minutes.   Patient Denies nocturnal pain.  Difficulty dressing/grooming: Denies Difficulty climbing stairs: Reports Difficulty getting out of chair: Reports Difficulty using hands for taps, buttons, cutlery, and/or writing: Denies  Review of Systems  Constitutional:  Positive for fatigue.  HENT:  Negative for mouth sores, mouth dryness and nose dryness.   Eyes:  Positive for dryness. Negative for pain and itching.  Respiratory:  Negative for shortness of breath and difficulty breathing.   Cardiovascular:  Negative for chest pain and palpitations.  Gastrointestinal:  Negative for blood in stool, constipation and diarrhea.  Endocrine: Negative for increased urination.  Genitourinary:  Negative for difficulty urinating.  Musculoskeletal:  Positive for joint pain, joint pain, joint swelling and morning stiffness. Negative for myalgias, muscle tenderness and myalgias.  Skin:  Positive for color change. Negative for rash and redness.   Allergic/Immunologic: Negative for susceptible to infections.  Neurological:  Positive for numbness. Negative for dizziness, headaches, memory loss and weakness.  Hematological:  Positive for anemia.  Psychiatric/Behavioral:  Negative for confusion.    PMFS History:  Patient Active Problem List   Diagnosis Date Noted  . Atrial fibrillation ([place]) 04/16/2021  . Demand ischemia ([place]) 03/26/2021  . Unspecified atrial fibrillation ([place]) 03/26/2021  . COVID-19 virus infection 03/26/2021  . PVC (premature ventricular contraction) 05/06/2020  . Type 2 diabetes mellitus with stage 3 chronic kidney disease, without long-term current use of insulin ([place]) 03/20/2020  . Palpitations 03/20/2020  . 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/2020  . Primary hypothyroidism 03/20/2020  . Vitiligo 03/20/2020  . Iron deficiency anemia 08/12/2017  . Osteoporosis 10/02/2016  . Systemic lupus erythematosus ([place]) 10/02/2016  . Rheumatoid arthritis involving multiple sites with positive rheumatoid factor ([place]) 10/02/2016  . High risk medication use 10/02/2016  . History of chronic kidney disease 10/02/2016  . 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/2016  . Primary osteoarthritis of both knees 10/02/2016  . Vitamin D deficiency 10/02/2016  . History of diabetes mellitus 10/02/2016  . History of hypertension 10/02/2016  . History of asthma 10/02/2016  . Absolute anemia   . MGUS (monoclonal gammopathy of unknown significance)   . Normocytic anemia 03/21/2015  . Essential hypertension 03/20/2015  . Abnormal CT of the chest 07/30/2012  . Asthma 06/19/2012    Past Medical History:  Diagnosis Date  . Anemia   . Arthritis   .  Asthma   . Atrial fibrillation ([place])   . Diabetes mellitus without complication ([place])   . Gout 12/17/2014   patient reported  . Hyperlipidemia   . Hypertension   . Systemic lupus erythematosus ([place])   . Vitiligo     Family History  Problem Relation Age of Onset   . Heart disease Father   . Diabetes Father   . Hypertension Mother   . Hypertension Sister   . Hypertension Sister   . Leukemia Sister    Past Surgical History:  Procedure Laterality Date  . CATARACT EXTRACTION Bilateral 2015  . DILATION AND CURETTAGE OF UTERUS    . DOPPLER ECHOCARDIOGRAPHY  05/2018   Internist to review with pt; potential heart murmur 06/20/18  . keratosis removal  2021  . SKIN SURGERY  11/30/2018   left side of face  . TOOTH EXTRACTION     Social History   Social History Narrative  . Not on file   Immunization History  Administered Date(s) Administered  . 19-influenza Whole 07/15/2012  . DTaP 06/18/2019  . Fluad Quad(high Dose 65+) 06/19/2020  . Influenza Split 06/28/2014  . Influenza, High Dose Seasonal PF 05/18/2019  . Influenza,inj,Quad PF,6+ Mos 06/20/2018  . Moderna SARS-COV2 Booster Vaccination 01/01/2021  . Moderna Sars-Covid-2 Vaccination 11/20/2019, 12/19/2019, 08/06/2020  . Pneumococcal Conjugate-13 05/04/2018  . Pneumococcal Polysaccharide-23 05/18/2019  . Pneumococcal-Unspecified 06/28/2014  . Tdap 06/15/2019  . Zoster Recombinat (Shingrix) 04/24/2021     Objective: Vital Signs: BP:145/83, pulse: 58, weight: 160 lbs, height: 4'10"  Physical Exam Vitals and nursing note reviewed.  Constitutional:      Appearance: She is well-developed.  HENT:     Head: Normocephalic and atraumatic.  Eyes:     Conjunctiva/sclera: Conjunctivae normal.  Cardiovascular:     Rate and Rhythm: Normal rate and regular rhythm.     Heart sounds: Normal heart sounds.  Pulmonary:     Effort: Pulmonary effort is normal.     Breath sounds: Normal breath sounds.  Abdominal:     General: Bowel sounds are normal.     Palpations: Abdomen is soft.  Musculoskeletal:     Cervical back: Normal range of motion.  Lymphadenopathy:     Cervical: No cervical adenopathy.  Skin:    General: Skin is warm and dry.     Capillary Refill: Capillary refill takes less than  2 seconds.  Neurological:     Mental Status: She is alert and oriented to person, place, and time.  Psychiatric:        Behavior: Behavior normal.     Musculoskeletal Exam: C-spine was in good range of motion.  Shoulder joints, elbow joints, wrist joints, MCPs PIPs and DIPs with good range of motion with no synovitis.  Hip joints in good range of motion.  She had some discomfort range of motion of her knee joints without any warmth swelling or effusion.  There was no tenderness over ankles or MTPs.  CDAI Exam: CDAI Score: 0.4 -- Patient Global: 2 --; Provider Global: 2 -- Swollen: 0 --; Tender: 0 -- Joint Exam 06/13/2021   No joint exam has been documented for this visit   There is currently no information documented on the homunculus. Go to the Rheumatology activity and complete the homunculus joint exam.  Investigation: No additional findings.  Imaging: No results found.  Recent Labs: Lab Results  Component Value Date   WBC 4.7 05/23/2021   HGB 10.7 (L) 05/23/2021   PLT 159 05/23/2021  NA 141 05/23/2021   K 4.0 05/23/2021   CL 105 05/23/2021   CO2 31 05/23/2021   GLUCOSE 70 05/23/2021   BUN 16 05/23/2021   CREATININE 1.07 (H) 05/23/2021   BILITOT 0.5 05/23/2021   ALKPHOS 43 04/16/2021   AST 26 05/23/2021   ALT 27 05/23/2021   PROT 6.2 05/23/2021   ALBUMIN 3.3 (L) 04/16/2021   CALCIUM 9.1 05/23/2021   GFRAA 44 (L) 12/19/2020    Speciality Comments: PLQ eye exam: 09/12/2020 WNL @ [facility] Follow up in 1 year  Procedures:  No procedures performed Allergies: Shellfish allergy and Shellfish-derived products   Assessment / Plan:     Visit Diagnoses: Rheumatoid arthritis involving multiple sites with positive rheumatoid factor (HCC)-positive RF, positive anti-CCP.  Patient had no synovitis on examination.  She has been tolerating hydroxychloroquine without any problems.  Other systemic lupus erythematosus with other organ involvement (HCC)-Positive ANA,  positive Smith, positive [id number] is no history of oral ulcers, nasal ulcers, malar rash, photosensitivity, sicca symptoms, Raynaud's phenomenon.  Patient has not had a flare in a long time.  High risk medication use-she is on Plaquenil 1 tablet p.o. daily.  Her GFR is in the 40s.  Her eye examination is up-to-date.  She will get eye examination later this year.  Age-related osteoporosis without current pathological fracture-she is on Prolia injections.  She will get her Prolia injection today.DEXA updated on 08/09/20: Total right hip BMD 0.660 with T score -2.4.  Statistically significant increase in BMD of lumbar spine and right femoral neck  History of vitamin D deficiency-she is on vitamin D supplement  Idiopathic chronic gout of multiple sites without tophus-uric acid was 3.8 on January 08, 2021.  Primary osteoarthritis of both knees-she continues to have pain and discomfort in the bilateral knee joints.  She plans to have left total knee replacement when she is stable.  MGUS (monoclonal gammopathy of unknown significance)-she is followed by Dr. Irene Limbo.  She is getting iron infusions for anemia.  History of hypertension-blood pressure is mildly elevated.  Patient states she was recently started on beta-blockers.  Atrial fibrillation-she was recently diagnosed with atrial fibrillation.  She was placed on Eliquis.  Chronic anticoagulation-she was placed on Eliquis.  History of chronic kidney disease-GFR is in 72s.  History of diabetes mellitus  History of anemia  History of asthma  Orders: No orders of the defined types were placed in this encounter.  No orders of the defined types were placed in this encounter.  .  Follow-Up Instructions: She will return for follow-up in 5 months for rheumatoid arthritis and lupus.Bo Merino, MD  Note - This record has been created using Editor, commissioning.  Chart creation errors have been sought, but may not always  have been  located. Such creation errors do not reflect on  the standard of medical care.

## 2021-06-04 DIAGNOSIS — M47814 Spondylosis without myelopathy or radiculopathy, thoracic region: Secondary | ICD-10-CM | POA: Diagnosis not present

## 2021-06-04 DIAGNOSIS — M9905 Segmental and somatic dysfunction of pelvic region: Secondary | ICD-10-CM | POA: Diagnosis not present

## 2021-06-04 DIAGNOSIS — M9903 Segmental and somatic dysfunction of lumbar region: Secondary | ICD-10-CM | POA: Diagnosis not present

## 2021-06-04 DIAGNOSIS — M9902 Segmental and somatic dysfunction of thoracic region: Secondary | ICD-10-CM | POA: Diagnosis not present

## 2021-06-04 DIAGNOSIS — M5136 Other intervertebral disc degeneration, lumbar region: Secondary | ICD-10-CM | POA: Diagnosis not present

## 2021-06-04 DIAGNOSIS — M5137 Other intervertebral disc degeneration, lumbosacral region: Secondary | ICD-10-CM | POA: Diagnosis not present

## 2021-06-04 NOTE — Telephone Encounter (Signed)
New Message:      Patient wants to know if here anybody here that can look over application for her Eliquis, since Hannah [name] will not be available until 06-11-21 please? She also said there is a form that have to be filled out by Hannah [name] or somebody in his office.

## 2021-06-04 NOTE — Telephone Encounter (Signed)
Spoke with pt and advised once her portion is completed to drop off paperwork at office front desk as we do have someone covering for [place] until she returns.  Pt verbalizes understanding and thanked Therapist, sports for the phone call.

## 2021-06-06 ENCOUNTER — Other Ambulatory Visit (HOSPITAL_COMMUNITY): Payer: Self-pay

## 2021-06-06 NOTE — Telephone Encounter (Signed)
Pt brought her Pt Asst Paperwork by the office today. I called her to let her know everything was completed and Dr Acie Fredrickson had signed it. I faxed completed form with requested documentation to [employer].

## 2021-06-09 ENCOUNTER — Other Ambulatory Visit (HOSPITAL_COMMUNITY): Payer: Self-pay

## 2021-06-09 NOTE — Telephone Encounter (Signed)
Received a fax today from [facility] stating that pt has been approved to receive Eliquis free of charge from 06/09/2021 through 09/27/2021.

## 2021-06-10 ENCOUNTER — Other Ambulatory Visit (HOSPITAL_COMMUNITY): Payer: Self-pay

## 2021-06-10 ENCOUNTER — Telehealth: Payer: Self-pay | Admitting: Cardiovascular Disease

## 2021-06-10 NOTE — Telephone Encounter (Signed)
Rosann Auerbach helped pt fill out an app for Eliquis medication which was approved and meds should be delivered soon... pt would like to thank her for her help.

## 2021-06-10 NOTE — Telephone Encounter (Signed)
Prolia received from [facility] and placed in refrigerator for patient to receive at office visit on 06/13/21  Knox Saliva, PharmD, MPH, BCPS Clinical Pharmacist (Rheumatology and Pulmonology)

## 2021-06-13 ENCOUNTER — Encounter: Payer: Self-pay | Admitting: Rheumatology

## 2021-06-13 ENCOUNTER — Ambulatory Visit: Payer: Medicare HMO | Admitting: Rheumatology

## 2021-06-13 ENCOUNTER — Other Ambulatory Visit: Payer: Self-pay

## 2021-06-13 VITALS — BP 145/83 | HR 58 | Ht <= 58 in | Wt 160.0 lb

## 2021-06-13 DIAGNOSIS — Z8639 Personal history of other endocrine, nutritional and metabolic disease: Secondary | ICD-10-CM | POA: Diagnosis not present

## 2021-06-13 DIAGNOSIS — Z79899 Other long term (current) drug therapy: Secondary | ICD-10-CM | POA: Diagnosis not present

## 2021-06-13 DIAGNOSIS — M0579 Rheumatoid arthritis with rheumatoid factor of multiple sites without organ or systems involvement: Secondary | ICD-10-CM | POA: Diagnosis not present

## 2021-06-13 DIAGNOSIS — M170 Bilateral primary osteoarthritis of knee: Secondary | ICD-10-CM | POA: Diagnosis not present

## 2021-06-13 DIAGNOSIS — M810 Age-related osteoporosis without current pathological fracture: Secondary | ICD-10-CM | POA: Diagnosis not present

## 2021-06-13 DIAGNOSIS — M1A09X0 Idiopathic chronic gout, multiple sites, without tophus (tophi): Secondary | ICD-10-CM | POA: Diagnosis not present

## 2021-06-13 DIAGNOSIS — Z87448 Personal history of other diseases of urinary system: Secondary | ICD-10-CM

## 2021-06-13 DIAGNOSIS — Z8709 Personal history of other diseases of the respiratory system: Secondary | ICD-10-CM

## 2021-06-13 DIAGNOSIS — Z862 Personal history of diseases of the blood and blood-forming organs and certain disorders involving the immune mechanism: Secondary | ICD-10-CM

## 2021-06-13 DIAGNOSIS — M3219 Other organ or system involvement in systemic lupus erythematosus: Secondary | ICD-10-CM | POA: Diagnosis not present

## 2021-06-13 DIAGNOSIS — Z7901 Long term (current) use of anticoagulants: Secondary | ICD-10-CM

## 2021-06-13 DIAGNOSIS — D472 Monoclonal gammopathy: Secondary | ICD-10-CM

## 2021-06-13 DIAGNOSIS — Z8679 Personal history of other diseases of the circulatory system: Secondary | ICD-10-CM | POA: Diagnosis not present

## 2021-06-13 DIAGNOSIS — I480 Paroxysmal atrial fibrillation: Secondary | ICD-10-CM

## 2021-06-13 MED ORDER — DENOSUMAB 60 MG/ML ~~LOC~~ SOSY
60.0000 mg | PREFILLED_SYRINGE | Freq: Once | SUBCUTANEOUS | Status: AC
  Administered 2021-06-13: 60 mg via SUBCUTANEOUS

## 2021-06-13 MED ORDER — HYDROXYCHLOROQUINE SULFATE 200 MG PO TABS: 200.0000 mg | ORAL_TABLET | Freq: Every day | ORAL | 0 refills | Status: DC

## 2021-06-13 NOTE — Patient Instructions (Signed)
Standing Labs We placed an order today for your standing lab work.   Please have your standing labs drawn in January  If possible, please have your labs drawn 2 weeks prior to your appointment so that the provider can discuss your results at your appointment.  Please note that you may see your imaging and lab results in [place] before we have reviewed them. We may be awaiting multiple results to interpret others before contacting you. Please allow our office up to 72 hours to thoroughly review all of the results before contacting the office for clarification of your results.  We have open lab daily: Monday through Thursday from 1:30-4:30 PM and Friday from 1:30-4:00 PM at the office of Dr. Bo Merino, [facility].   Please be advised, all patients with office appointments requiring lab work will take precedent over walk-in lab work.  If possible, please come for your lab work on Monday and Friday afternoons, as you may experience shorter wait times. The office is located at [address] [place], [place], [place] [address] No appointment is necessary.   Labs are drawn by [facility]. Please bring your co-pay at the time of your lab draw.  You may receive a bill from [facility] for your lab work.  If you wish to have your labs drawn at another location, please call the office 24 hours in advance to send orders.  If you have any questions regarding directions or hours of operation,  please call [phone].   As a reminder, please drink plenty of water prior to coming for your lab work. Thanks!

## 2021-06-13 NOTE — Progress Notes (Signed)
Pharmacy Note  Subjective:   Patient presents to clinic today to receive bi-annual dose of Prolia.  Patient running a fever or have signs/symptoms of infection? No  Patient currently on antibiotics for the treatment of infection? No  Patient had fall in the last 6 months?  No  If yes, did it require medical attention? No   Patient taking calcium 1200 mg daily through diet or supplement and at least 800 units vitamin D? Yes  Objective: CMP     Component Value Date/Time   NA 141 05/23/2021 1333   NA 143 04/08/2021 1629   NA 142 08/12/2017 0808   K 4.0 05/23/2021 1333   K 3.7 08/12/2017 0808   CL 105 05/23/2021 1333   CO2 31 05/23/2021 1333   CO2 27 08/12/2017 0808   GLUCOSE 70 05/23/2021 1333   GLUCOSE 85 08/12/2017 0808   BUN 16 05/23/2021 1333   BUN 15 04/08/2021 1629   BUN 20.8 08/12/2017 0808   CREATININE 1.07 (H) 05/23/2021 1333   CREATININE 1.4 (H) 08/12/2017 0808   CALCIUM 9.1 05/23/2021 1333   CALCIUM 10.3 08/12/2017 0808   PROT 6.2 05/23/2021 1333   PROT 7.1 12/30/2020 1254   PROT 6.8 08/12/2017 0808   ALBUMIN 3.3 (L) 04/16/2021 0729   ALBUMIN 4.5 06/19/2020 1429   ALBUMIN 3.7 08/12/2017 0808   AST 26 05/23/2021 1333   AST 17 04/16/2021 0729   AST 31 08/12/2017 0808   ALT 27 05/23/2021 1333   ALT 10 04/16/2021 0729   ALT 38 08/12/2017 0808   ALKPHOS 43 04/16/2021 0729   ALKPHOS 51 08/12/2017 0808   BILITOT 0.5 05/23/2021 1333   BILITOT 0.5 04/16/2021 0729   BILITOT 0.45 08/12/2017 0808   GFRNONAA 52 (L) 04/16/2021 0729   GFRNONAA 38 (L) 12/19/2020 1426   GFRAA 44 (L) 12/19/2020 1426    CBC    Component Value Date/Time   WBC 4.7 05/23/2021 1333   RBC 3.31 (L) 05/23/2021 1333   HGB 10.7 (L) 05/23/2021 1333   HGB 10.9 (L) 04/08/2021 1629   HGB 9.9 (L) 08/12/2017 0808   HCT 32.1 (L) 05/23/2021 1333   HCT 32.8 (L) 04/08/2021 1629   HCT 30.7 (L) 08/12/2017 0808   PLT 159 05/23/2021 1333   PLT 245 04/08/2021 1629   MCV 97.0 05/23/2021 1333   MCV  95 04/08/2021 1629   MCV 97.5 08/12/2017 0808   MCH 32.3 05/23/2021 1333   MCHC 33.3 05/23/2021 1333   RDW 14.5 05/23/2021 1333   RDW 14.2 04/08/2021 1629   RDW 14.0 08/12/2017 0808   LYMPHSABS 912 05/23/2021 1333   LYMPHSABS 0.8 04/08/2021 1629   LYMPHSABS 0.9 08/12/2017 0808   MONOABS 0.4 04/16/2021 0729   MONOABS 0.3 08/12/2017 0808   EOSABS 390 05/23/2021 1333   EOSABS 0.2 04/08/2021 1629   BASOSABS 52 05/23/2021 1333   BASOSABS 0.0 04/08/2021 1629   BASOSABS 0.0 08/12/2017 0808    Lab Results  Component Value Date   VD25OH 41 05/23/2021    T-score:  -2.4. 08/09/20  Assessment/Plan:   Administrations This Visit     denosumab (PROLIA) injection 60 mg     Admin Date 06/13/2021 Action Given Dose 60 mg Route Subcutaneous Administered By Carole Binning, LPN             Patient tolerated injection well.    All questions encouraged and answered.  Instructed patient to call with any further questions or concerns.

## 2021-06-18 DIAGNOSIS — M5136 Other intervertebral disc degeneration, lumbar region: Secondary | ICD-10-CM | POA: Diagnosis not present

## 2021-06-18 DIAGNOSIS — M9902 Segmental and somatic dysfunction of thoracic region: Secondary | ICD-10-CM | POA: Diagnosis not present

## 2021-06-18 DIAGNOSIS — M5137 Other intervertebral disc degeneration, lumbosacral region: Secondary | ICD-10-CM | POA: Diagnosis not present

## 2021-06-18 DIAGNOSIS — M9903 Segmental and somatic dysfunction of lumbar region: Secondary | ICD-10-CM | POA: Diagnosis not present

## 2021-06-18 DIAGNOSIS — M47814 Spondylosis without myelopathy or radiculopathy, thoracic region: Secondary | ICD-10-CM | POA: Diagnosis not present

## 2021-06-18 DIAGNOSIS — M9905 Segmental and somatic dysfunction of pelvic region: Secondary | ICD-10-CM | POA: Diagnosis not present

## 2021-06-25 ENCOUNTER — Ambulatory Visit (INDEPENDENT_AMBULATORY_CARE_PROVIDER_SITE_OTHER): Payer: Medicare HMO

## 2021-06-25 ENCOUNTER — Ambulatory Visit (INDEPENDENT_AMBULATORY_CARE_PROVIDER_SITE_OTHER): Payer: Medicare HMO | Admitting: Internal Medicine

## 2021-06-25 ENCOUNTER — Other Ambulatory Visit: Payer: Self-pay

## 2021-06-25 ENCOUNTER — Encounter: Payer: Self-pay | Admitting: Internal Medicine

## 2021-06-25 VITALS — BP 130/70 | HR 67 | Temp 97.9°F | Ht <= 58 in | Wt 161.8 lb

## 2021-06-25 VITALS — BP 138/70 | HR 67 | Temp 97.9°F | Ht <= 58 in | Wt 161.0 lb

## 2021-06-25 DIAGNOSIS — M170 Bilateral primary osteoarthritis of knee: Secondary | ICD-10-CM

## 2021-06-25 DIAGNOSIS — Z79899 Other long term (current) drug therapy: Secondary | ICD-10-CM

## 2021-06-25 DIAGNOSIS — M79609 Pain in unspecified limb: Secondary | ICD-10-CM | POA: Insufficient documentation

## 2021-06-25 DIAGNOSIS — I129 Hypertensive chronic kidney disease with stage 1 through stage 4 chronic kidney disease, or unspecified chronic kidney disease: Secondary | ICD-10-CM

## 2021-06-25 DIAGNOSIS — E039 Hypothyroidism, unspecified: Secondary | ICD-10-CM | POA: Diagnosis not present

## 2021-06-25 DIAGNOSIS — R202 Paresthesia of skin: Secondary | ICD-10-CM

## 2021-06-25 DIAGNOSIS — J4520 Mild intermittent asthma, uncomplicated: Secondary | ICD-10-CM

## 2021-06-25 DIAGNOSIS — E1122 Type 2 diabetes mellitus with diabetic chronic kidney disease: Secondary | ICD-10-CM

## 2021-06-25 DIAGNOSIS — M545 Low back pain, unspecified: Secondary | ICD-10-CM | POA: Diagnosis not present

## 2021-06-25 DIAGNOSIS — Z0000 Encounter for general adult medical examination without abnormal findings: Secondary | ICD-10-CM | POA: Diagnosis not present

## 2021-06-25 DIAGNOSIS — N1831 Chronic kidney disease, stage 3a: Secondary | ICD-10-CM | POA: Diagnosis not present

## 2021-06-25 DIAGNOSIS — G8929 Other chronic pain: Secondary | ICD-10-CM | POA: Insufficient documentation

## 2021-06-25 NOTE — Progress Notes (Signed)
This visit occurred during the SARS-CoV-2 public health emergency.  Safety protocols were in place, including screening questions prior to the visit, additional usage of staff PPE, and extensive cleaning of exam room while observing appropriate contact time as indicated for disinfecting solutions.  Subjective:   Hannah [name] is a 75 y.o. female who presents for Medicare Annual (Subsequent) preventive examination.  Review of Systems     Cardiac Risk Factors include: advanced age (>29men, >39 women);diabetes mellitus;hypertension;obesity (BMI >30kg/m2)     Objective:    Today's Vitals   06/25/21 1101 06/25/21 1110 06/25/21 1131  BP: 138/70  130/70  Pulse: 67    Temp: 97.9 F (36.6 C)    TempSrc: Oral    SpO2: 99%    Weight: 161 lb 12.8 oz (73.4 kg)    Height: 4' 9.2" (1.453 m)    PainSc:  5     Body mass index is 34.77 kg/m.  Advanced Directives 06/25/2021 03/26/2021 03/26/2021 06/19/2020 12/26/2019 07/24/2019 06/15/2019  Does Patient Have a Medical Advance Directive? Yes Yes Yes Yes Yes Yes Yes  Type of Paramedic of [place];Living will Healthcare Power of [place];Living will [place];Living will [place];Living will - [place];Living will  Does patient want to make changes to medical advance directive? - No - Patient declined - - No - Patient declined No - Patient declined -  Copy of [place] in Chart? No - copy requested No - copy requested - No - copy requested Yes - validated most recent copy scanned in chart (See row information) - No - copy requested    Current Medications (verified) Outpatient Encounter Medications as of 06/25/2021  Medication Sig   Accu-Chek Softclix Lancets lancets Use to check blood sugars daily E11.69   albuterol (PROVENTIL HFA;VENTOLIN HFA) 108 (90 Base) MCG/ACT inhaler Inhale 2 puffs into the lungs every 6 (six)  hours as needed for wheezing or shortness of breath.   Alcohol Swabs (ALCOHOL PADS) 70 % PADS Use as directed to check blood sugars 1 time per day dx: e11.22   allopurinol (ZYLOPRIM) 100 MG tablet TAKE 1 TABLET BY MOUTH EVERY DAY FOR GOUT   amLODipine (NORVASC) 2.5 MG tablet TAKE 1 TABLET(2.5 MG) BY MOUTH DAILY   Ascorbic Acid (VITAMIN C) 1000 MG tablet Take 1,000 mg by mouth daily.   atorvastatin (LIPITOR) 80 MG tablet TAKE 1 TABLET BY MOUTH EVERY DAY   budesonide-formoterol (SYMBICORT) 160-4.5 MCG/ACT inhaler INHALE 2 PUFFS BY MOUTH TWICE DAILY IN THE MORNING AND EVENING (Patient taking differently: as needed. INHALE 2 PUFFS BY MOUTH TWICE DAILY IN THE MORNING AND EVENING)   Calcium Carb-Cholecalciferol 600-800 MG-UNIT TABS Take 1 tablet by mouth daily.    Cholecalciferol (VITAMIN D PO) Take 2,000 Units by mouth daily.   cyanocobalamin 1000 MCG tablet Take 1,000 mcg by mouth daily.   denosumab (PROLIA) 60 MG/ML SOSY injection Inject 60 mg into the skin every 6 (six) months. Appt on 06/07/21   glucose blood (ACCU-CHEK GUIDE) test strip Use to check blood sugars daily E11.69   hydroxychloroquine (PLAQUENIL) 200 MG tablet Take 1 tablet (200 mg total) by mouth daily.   Hypromellose (ARTIFICIAL TEARS OP) Apply 1 drop to eye daily as needed (dry eyes).   irbesartan (AVAPRO) 300 MG tablet TAKE 1 TABLET(300 MG) BY MOUTH DAILY   Liniments (BEN GAY EX) Apply 1 application topically daily as needed (pain).   metoprolol succinate (  TOPROL-XL) 25 MG 24 hr tablet Take 1 tablet (25 mg total) by mouth daily.   SYNTHROID 88 MCG tablet TAKE 1 TABLET BY MOUTH EVERY DAY MONDAY TO SATURDAY AND OFF ON SUNDAYS   trolamine salicylate (ASPERCREME) 10 % cream Apply 1 application topically daily as needed for muscle pain.   apixaban (ELIQUIS) 5 MG TABS tablet Take 1 tablet (5 mg total) by mouth 2 (two) times daily.   iron polysaccharides (NIFEREX) 150 MG capsule Take 150 mg by mouth daily. (Patient not taking: No sig  reported)   No facility-administered encounter medications on file as of 06/25/2021.    Allergies (verified) Shellfish allergy and Shellfish-derived products   History: Past Medical History:  Diagnosis Date   Anemia    Arthritis    Asthma    Atrial fibrillation ([place])    Diabetes mellitus without complication ([place])    Gout 12/17/2014   patient reported   Hyperlipidemia    Hypertension    Systemic lupus erythematosus ([place])    Vitiligo    Past Surgical History:  Procedure Laterality Date   CATARACT EXTRACTION Bilateral 2015   DILATION AND CURETTAGE OF UTERUS     DOPPLER ECHOCARDIOGRAPHY  05/2018   Internist to review with pt; potential heart murmur 06/20/18   keratosis removal  2021   SKIN SURGERY  11/30/2018   left side of face   TOOTH EXTRACTION     Family History  Problem Relation Age of Onset   Heart disease Father    Diabetes Father    Hypertension Mother    Hypertension Sister    Hypertension Sister    Leukemia Sister    Social History   Socioeconomic History   Marital status: Single    Spouse name: Not on file   Number of children: 0   Years of education: Not on file   Highest education level: Not on file  Occupational History   Occupation: retired  Tobacco Use   Smoking status: Never   Smokeless tobacco: Never  Vaping Use   Vaping Use: Never used  Substance and Sexual Activity   Alcohol use: Not Currently   Drug use: No   Sexual activity: Not Currently    Comment: 1ST intercourse- 37 , partners- 4,   Other Topics Concern   Not on file  Social History Narrative   Not on file   Social Determinants of Health   Financial Resource Strain: Low Risk    Difficulty of Paying Living Expenses: Not hard at all  Food Insecurity: No Food Insecurity   Worried About Charity fundraiser in the Last Year: Never true   Arboriculturist in the Last Year: Never true  Transportation Needs: No Transportation Needs   Lack of Transportation (Medical): No   Lack  of Transportation (Non-Medical): No  Physical Activity: Sufficiently Active   Days of Exercise per Week: 5 days   Minutes of Exercise per Session: 40 min  Stress: Stress Concern Present   Feeling of Stress : To some extent  Social Connections: Not on file    Tobacco Counseling Counseling given: Not Answered   Clinical Intake:  Pre-visit preparation completed: Yes  Pain : 0-10 Pain Score: 5  Pain Type: Chronic pain Pain Location: Knee Pain Orientation: Right, Left Pain Descriptors / Indicators: Aching Pain Onset: More than a month ago Pain Frequency: Constant     Nutritional Status: BMI > 30  Obese Nutritional Risks: None Diabetes: Yes  How often do you  need to have someone help you when you read instructions, pamphlets, or other written materials from your doctor or pharmacy?: 1 - Never What is the last grade level you completed in school?: college  Diabetic? Yes Nutrition Risk Assessment:  Has the patient had any N/V/D within the last 2 months?  No  Does the patient have any non-healing wounds?  No  Has the patient had any unintentional weight loss or weight gain?  No   Diabetes:  Is the patient diabetic?  Yes  If diabetic, was a CBG obtained today?  No  Did the patient bring in their glucometer from home?  No  How often do you monitor your CBG's? Every other day.   Financial Strains and Diabetes Management:  Are you having any financial strains with the device, your supplies or your medication? No .  Does the patient want to be seen by Chronic Care Management for management of their diabetes?  No  Would the patient like to be referred to a Nutritionist or for Diabetic Management?  No   Diabetic Exams:  Diabetic Eye Exam: Completed 09/12/2020 Diabetic Foot Exam: Completed 10/31/2020   Interpreter Needed?: No  Information entered by :: NAllen LPN   Activities of Daily Living In your present state of health, do you have any difficulty performing the  following activities: 06/25/2021 03/26/2021  Hearing? N N  Vision? N N  Difficulty concentrating or making decisions? N N  Walking or climbing stairs? Y Y  Comment - arthritis  Dressing or bathing? N N  Doing errands, shopping? N N  Preparing Food and eating ? N -  Using the Toilet? N -  In the past six months, have you accidently leaked urine? N -  Do you have problems with loss of bowel control? N -  Managing your Medications? N -  Managing your Finances? N -  Housekeeping or managing your Housekeeping? N -  Some recent data might be hidden    Patient Care Team: Glendale Chard, MD as PCP - General (Internal Medicine) Nahser, Wonda Cheng, MD as PCP - Cardiology (Cardiology) Rex Kras, Claudette Stapler, RN as [place] (Inactive) (Pharmacist)  Indicate any recent Medical Services you may have received from other than [facility] providers in the past year (date may be approximate).     Assessment:   This is a routine wellness examination for Hannah [name].  Hearing/Vision screen Vision Screening - Comments:: Regular eye exams,Dr. Katy Fitch  Dietary issues and exercise activities discussed: Current Exercise Habits: Home exercise routine, Type of exercise: yoga;calisthenics, Time (Minutes): 45, Frequency (Times/Week): 4, Weekly Exercise (Minutes/Week): 180   Goals Addressed             This Visit's Progress    Patient Stated       06/25/2021, wants to weigh 135-140 pounds       Depression Screen PHQ 2/9 Scores 06/25/2021 06/19/2020 06/19/2020 06/15/2019 06/15/2019 02/23/2019 10/25/2018  PHQ - 2 Score 0 0 0 0 0 0 0  PHQ- 9 Score - - - 6 - - -    Fall Risk Fall Risk  06/25/2021 06/19/2020 06/19/2020 06/15/2019 06/15/2019  Falls in the past year? 0 0 0 0 0  Risk for fall due to : Medication side effect Medication side effect - Medication side effect -  Follow up Falls evaluation completed;Education provided;Falls prevention discussed Falls evaluation  completed;Education provided;Falls prevention discussed - Falls evaluation completed;Education provided;Falls prevention discussed -    FALL  RISK PREVENTION PERTAINING TO THE HOME:  Any stairs in or around the home? No  If so, are there any without handrails?  N/a Home free of loose throw rugs in walkways, pet beds, electrical cords, etc? Yes  Adequate lighting in your home to reduce risk of falls? Yes   ASSISTIVE DEVICES UTILIZED TO PREVENT FALLS:  Life alert? No  Use of a cane, walker or w/c? Yes  Grab bars in the bathroom? Yes  Shower chair or bench in shower? No  Elevated toilet seat or a handicapped toilet? No   TIMED UP AND GO:  Was the test performed? No .    Gait slow and steady without use of assistive device  Cognitive Function:     6CIT Screen 06/25/2021 06/19/2020 06/15/2019  What Year? 0 points 0 points 0 points  What month? 0 points 0 points 0 points  What time? 0 points 0 points 0 points  Count back from 20 0 points 0 points 0 points  Months in reverse 0 points 0 points 2 points  Repeat phrase 4 points 2 points 0 points  Total Score 4 2 2     Immunizations Immunization History  Administered Date(s) Administered   19-influenza Whole 07/15/2012   DTaP 06/18/2019   Fluad Quad(high Dose 65+) 06/19/2020, 06/03/2021   Influenza Split 06/28/2014   Influenza, High Dose Seasonal PF 05/18/2019   Influenza,inj,Quad PF,6+ Mos 06/20/2018   Moderna SARS-COV2 Booster Vaccination 01/01/2021   Moderna Sars-Covid-2 Vaccination 11/20/2019, 12/19/2019, 08/06/2020   Pneumococcal Conjugate-13 05/04/2018   Pneumococcal Polysaccharide-23 05/18/2019   Pneumococcal-Unspecified 06/28/2014   Tdap 06/15/2019   Zoster Recombinat (Shingrix) 02/19/2021, 04/24/2021    TDAP status: Up to date  Flu Vaccine status: Up to date  Pneumococcal vaccine status: Up to date  Covid-19 vaccine status: Completed vaccines  Qualifies for Shingles Vaccine? Yes   Zostavax completed No    Shingrix Completed?: Yes  Screening Tests Health Maintenance  Topic Date Due   COVID-19 Vaccine (5 - Booster for Moderna series) 05/03/2021   HEMOGLOBIN A1C  07/01/2021   OPHTHALMOLOGY EXAM  09/12/2021   FOOT EXAM  10/31/2021   MAMMOGRAM  03/11/2022   COLONOSCOPY (Pts 45-61yrs Insurance coverage will need to be confirmed)  03/21/2023   TETANUS/TDAP  06/17/2029   INFLUENZA VACCINE  Completed   DEXA SCAN  Completed   Hepatitis C Screening  Completed   Zoster Vaccines- Shingrix  Completed   HPV VACCINES  Aged Out    Health Maintenance  Health Maintenance Due  Topic Date Due   COVID-19 Vaccine (5 - Booster for Moderna series) 05/03/2021    Colorectal cancer screening: Type of screening: Colonoscopy. Completed 03/20/2013. Repeat every 10 years  Mammogram status: Completed 03/11/2021. Repeat every year  Bone Density status: Completed 08/09/2020.   Lung Cancer Screening: (Low Dose CT Chest recommended if Age 58-80 years, 30 pack-year currently smoking OR have quit w/in 15years.) does not qualify.   Lung Cancer Screening Referral: no  Additional Screening:  Hepatitis C Screening: does qualify; Completed 03/24/2017  Vision Screening: Recommended annual ophthalmology exams for early detection of glaucoma and other disorders of the eye. Is the patient up to date with their annual eye exam?  Yes  Who is the provider or what is the name of the office in which the patient attends annual eye exams? Dr. Katy Fitch If pt is not established with a provider, would they like to be referred to a provider to establish care? No .   Dental Screening:  Recommended annual dental exams for proper oral hygiene  Community Resource Referral / Chronic Care Management: CRR required this visit?  No   CCM required this visit?  No      Plan:     I have personally reviewed and noted the following in the patient's chart:   Medical and social history Use of alcohol, tobacco or illicit drugs  Current  medications and supplements including opioid prescriptions.  Functional ability and status Nutritional status Physical activity Advanced directives List of other physicians Hospitalizations, surgeries, and ER visits in previous 12 months Vitals Screenings to include cognitive, depression, and falls Referrals and appointments  In addition, I have reviewed and discussed with patient certain preventive protocols, quality metrics, and best practice recommendations. A written personalized care plan for preventive services as well as general preventive health recommendations were provided to patient.     Kellie Simmering, LPN   [id number]   Nurse Notes:

## 2021-06-25 NOTE — Patient Instructions (Signed)

## 2021-06-25 NOTE — Patient Instructions (Signed)
Hannah [name] , Thank you for taking time to come for your Medicare Wellness Visit. I appreciate your ongoing commitment to your health goals. Please review the following plan we discussed and let me know if I can assist you in the future.   Screening recommendations/referrals: Colonoscopy: completed 03/20/2013 Mammogram: completed 03/11/2021 Bone Density: completed 08/09/2020 Recommended yearly ophthalmology/optometry visit for glaucoma screening and checkup Recommended yearly dental visit for hygiene and checkup  Vaccinations: Influenza vaccine: completed 06/03/2021 Pneumococcal vaccine: completed 05/18/2019 Tdap vaccine: completed 06/15/2019, due 06/14/2029 Shingles vaccine: completed   Covid-19: 01/01/2021, 08/06/2020, 12/19/2019, 11/20/2019  Advanced directives: Please bring a copy of your POA (Power of Attorney) and/or Living Will to your next appointment.   Conditions/risks identified: none  Next appointment: Follow up in one year for your annual wellness visit    Preventive Care 65 Years and Older, Female Preventive care refers to lifestyle choices and visits with your health care provider that can promote health and wellness. What does preventive care include? A yearly physical exam. This is also called an annual well check. Dental exams once or twice a year. Routine eye exams. Ask your health care provider how often you should have your eyes checked. Personal lifestyle choices, including: Daily care of your teeth and gums. Regular physical activity. Eating a healthy diet. Avoiding tobacco and drug use. Limiting alcohol use. Practicing safe sex. Taking low-dose aspirin every day. Taking vitamin and mineral supplements as recommended by your health care provider. What happens during an annual well check? The services and screenings done by your health care provider during your annual well check will depend on your age, overall health, lifestyle risk factors, and family history of  disease. Counseling  Your health care provider may ask you questions about your: Alcohol use. Tobacco use. Drug use. Emotional well-being. Home and relationship well-being. Sexual activity. Eating habits. History of falls. Memory and ability to understand (cognition). Work and work Statistician. Reproductive health. Screening  You may have the following tests or measurements: Height, weight, and BMI. Blood pressure. Lipid and cholesterol levels. These may be checked every 5 years, or more frequently if you are over 8 years old. Skin check. Lung cancer screening. You may have this screening every year starting at age 16 if you have a 30-pack-year history of smoking and currently smoke or have quit within the past 15 years. Fecal occult blood test (FOBT) of the stool. You may have this test every year starting at age 78. Flexible sigmoidoscopy or colonoscopy. You may have a sigmoidoscopy every 5 years or a colonoscopy every 10 years starting at age 69. Hepatitis C blood test. Hepatitis B blood test. Sexually transmitted disease (STD) testing. Diabetes screening. This is done by checking your blood sugar (glucose) after you have not eaten for a while (fasting). You may have this done every 1-3 years. Bone density scan. This is done to screen for osteoporosis. You may have this done starting at age 33. Mammogram. This may be done every 1-2 years. Talk to your health care provider about how often you should have regular mammograms. Talk with your health care provider about your test results, treatment options, and if necessary, the need for more tests. Vaccines  Your health care provider may recommend certain vaccines, such as: Influenza vaccine. This is recommended every year. Tetanus, diphtheria, and acellular pertussis (Tdap, Td) vaccine. You may need a Td booster every 10 years. Zoster vaccine. You may need this after age 71. Pneumococcal 13-valent conjugate (PCV13) vaccine. One  dose is recommended after age 35. Pneumococcal polysaccharide (PPSV23) vaccine. One dose is recommended after age 4. Talk to your health care provider about which screenings and vaccines you need and how often you need them. This information is not intended to replace advice given to you by your health care provider. Make sure you discuss any questions you have with your health care provider. Document Released: 10/11/2015 Document Revised: 06/03/2016 Document Reviewed: 07/16/2015 Elsevier Interactive Patient Education  2017 [place] Prevention in the Home Falls can cause injuries. They can happen to people of all ages. There are many things you can do to make your home safe and to help prevent falls. What can I do on the outside of my home? Regularly fix the edges of walkways and driveways and fix any cracks. Remove anything that might make you trip as you walk through a door, such as a raised step or threshold. Trim any bushes or trees on the path to your home. Use bright outdoor lighting. Clear any walking paths of anything that might make someone trip, such as rocks or tools. Regularly check to see if handrails are loose or broken. Make sure that both sides of any steps have handrails. Any raised decks and porches should have guardrails on the edges. Have any leaves, snow, or ice cleared regularly. Use sand or salt on walking paths during winter. Clean up any spills in your garage right away. This includes oil or grease spills. What can I do in the bathroom? Use night lights. Install grab bars by the toilet and in the tub and shower. Do not use towel bars as grab bars. Use non-skid mats or decals in the tub or shower. If you need to sit down in the shower, use a plastic, non-slip stool. Keep the floor dry. Clean up any water that spills on the floor as soon as it happens. Remove soap buildup in the tub or shower regularly. Attach bath mats securely with double-sided  non-slip rug tape. Do not have throw rugs and other things on the floor that can make you trip. What can I do in the bedroom? Use night lights. Make sure that you have a light by your bed that is easy to reach. Do not use any sheets or blankets that are too big for your bed. They should not hang down onto the floor. Have a firm chair that has side arms. You can use this for support while you get dressed. Do not have throw rugs and other things on the floor that can make you trip. What can I do in the kitchen? Clean up any spills right away. Avoid walking on wet floors. Keep items that you use a lot in easy-to-reach places. If you need to reach something above you, use a strong step stool that has a grab bar. Keep electrical cords out of the way. Do not use floor polish or wax that makes floors slippery. If you must use wax, use non-skid floor wax. Do not have throw rugs and other things on the floor that can make you trip. What can I do with my stairs? Do not leave any items on the stairs. Make sure that there are handrails on both sides of the stairs and use them. Fix handrails that are broken or loose. Make sure that handrails are as long as the stairways. Check any carpeting to make sure that it is firmly attached to the stairs. Fix any carpet that is loose or worn. Avoid  having throw rugs at the top or bottom of the stairs. If you do have throw rugs, attach them to the floor with carpet tape. Make sure that you have a light switch at the top of the stairs and the bottom of the stairs. If you do not have them, ask someone to add them for you. What else can I do to help prevent falls? Wear shoes that: Do not have high heels. Have rubber bottoms. Are comfortable and fit you well. Are closed at the toe. Do not wear sandals. If you use a stepladder: Make sure that it is fully opened. Do not climb a closed stepladder. Make sure that both sides of the stepladder are locked into place. Ask  someone to hold it for you, if possible. Clearly mark and make sure that you can see: Any grab bars or handrails. First and last steps. Where the edge of each step is. Use tools that help you move around (mobility aids) if they are needed. These include: Canes. Walkers. Scooters. Crutches. Turn on the lights when you go into a dark area. Replace any light bulbs as soon as they burn out. Set up your furniture so you have a clear path. Avoid moving your furniture around. If any of your floors are uneven, fix them. If there are any pets around you, be aware of where they are. Review your medicines with your doctor. Some medicines can make you feel dizzy. This can increase your chance of falling. Ask your doctor what other things that you can do to help prevent falls. This information is not intended to replace advice given to you by your health care provider. Make sure you discuss any questions you have with your health care provider. Document Released: 07/11/2009 Document Revised: 02/20/2016 Document Reviewed: 10/19/2014 Elsevier Interactive Patient Education  2017 [employer].

## 2021-06-25 NOTE — Progress Notes (Signed)
I,Tianna Badgett,acting as a Education administrator for [name], MD.,have documented all relevant documentation on the behalf of [name], MD,as directed by  [name], MD while in the presence of [name], MD.  This visit occurred during the SARS-CoV-2 public health emergency.  Safety protocols were in place, including screening questions prior to the visit, additional usage of staff PPE, and extensive cleaning of exam room while observing appropriate contact time as indicated for disinfecting solutions.  Subjective:     Patient ID: Hannah [name] , female    DOB: 1946/09/22 , 75 y.o.   MRN: [id number]   Chief Complaint  Patient presents with   Hypertension   Diabetes    HPI  She presents for diabetes and blood pressure check. She reports compliance with meds. States has joined [employer]. She denies headaches, chest pain and shortness of breath.   She was seen earlier today by [facility] Advisor for [facility].   Diabetes She presents for her follow-up diabetic visit. She has type 2 diabetes mellitus. There are no hypoglycemic associated symptoms. Pertinent negatives for hypoglycemia include no headaches. Pertinent negatives for diabetes include no blurred vision and no chest pain. There are no hypoglycemic complications. Diabetic complications include nephropathy. Risk factors for coronary artery disease include diabetes mellitus, dyslipidemia, hypertension, post-menopausal and sedentary lifestyle. She is following a diabetic diet. She participates in exercise three times a week. An ACE inhibitor/angiotensin II receptor blocker is being taken.  Hypertension This is a chronic problem. The current episode started more than 1 year ago. The problem has been gradually improving since onset. The problem is controlled. Pertinent negatives include no blurred vision, chest pain or headaches.    Past Medical History:  Diagnosis Date   Anemia    Arthritis    Asthma    Atrial  fibrillation ([place])    Diabetes mellitus without complication ([place])    Gout 12/17/2014   patient reported   Hyperlipidemia    Hypertension    Systemic lupus erythematosus ([place])    Vitiligo      Family History  Problem Relation Age of Onset   Heart disease Father    Diabetes Father    Hypertension Mother    Hypertension Sister    Hypertension Sister    Leukemia Sister      Current Outpatient Medications:    Accu-Chek Softclix Lancets lancets, Use to check blood sugars daily E11.69, Disp: 100 each, Rfl: 2   albuterol (PROVENTIL HFA;VENTOLIN HFA) 108 (90 Base) MCG/ACT inhaler, Inhale 2 puffs into the lungs every 6 (six) hours as needed for wheezing or shortness of breath., Disp: 1 Inhaler, Rfl: 2   Alcohol Swabs (ALCOHOL PADS) 70 % PADS, Use as directed to check blood sugars 1 time per day dx: e11.22, Disp: 150 each, Rfl: 2   allopurinol (ZYLOPRIM) 100 MG tablet, TAKE 1 TABLET BY MOUTH EVERY DAY FOR GOUT, Disp: 90 tablet, Rfl: 1   amLODipine (NORVASC) 2.5 MG tablet, TAKE 1 TABLET(2.5 MG) BY MOUTH DAILY, Disp: 30 tablet, Rfl: 11   apixaban (ELIQUIS) 5 MG TABS tablet, Take 1 tablet (5 mg total) by mouth 2 (two) times daily., Disp: 180 tablet, Rfl: 3   Ascorbic Acid (VITAMIN C) 1000 MG tablet, Take 1,000 mg by mouth daily., Disp: , Rfl:    atorvastatin (LIPITOR) 80 MG tablet, TAKE 1 TABLET BY MOUTH EVERY DAY, Disp: 90 tablet, Rfl: 1   budesonide-formoterol (SYMBICORT) 160-4.5 MCG/ACT inhaler, INHALE 2 PUFFS BY MOUTH TWICE  DAILY IN THE MORNING AND EVENING (Patient taking differently: as needed. INHALE 2 PUFFS BY MOUTH TWICE DAILY IN THE MORNING AND EVENING), Disp: 10.2 g, Rfl: 2   Calcium Carb-Cholecalciferol 600-800 MG-UNIT TABS, Take 1 tablet by mouth daily. , Disp: , Rfl:    Cholecalciferol (VITAMIN D PO), Take 2,000 Units by mouth daily., Disp: , Rfl:    cyanocobalamin 1000 MCG tablet, Take 1,000 mcg by mouth daily., Disp: , Rfl:    denosumab (PROLIA) 60 MG/ML SOSY injection, Inject 60  mg into the skin every 6 (six) months. Appt on 06/07/21, Disp: 1 mL, Rfl: 0   glucose blood (ACCU-CHEK GUIDE) test strip, Use to check blood sugars daily E11.69, Disp: 100 each, Rfl: 2   hydroxychloroquine (PLAQUENIL) 200 MG tablet, Take 1 tablet (200 mg total) by mouth daily., Disp: 90 tablet, Rfl: 0   Hypromellose (ARTIFICIAL TEARS OP), Apply 1 drop to eye daily as needed (dry eyes)., Disp: , Rfl:    irbesartan (AVAPRO) 300 MG tablet, TAKE 1 TABLET(300 MG) BY MOUTH DAILY, Disp: 90 tablet, Rfl: 2   iron polysaccharides (NIFEREX) 150 MG capsule, Take 150 mg by mouth daily. (Patient not taking: No sig reported), Disp: , Rfl:    Liniments (BEN GAY EX), Apply 1 application topically daily as needed (pain)., Disp: , Rfl:    metoprolol succinate (TOPROL-XL) 25 MG 24 hr tablet, Take 1 tablet (25 mg total) by mouth daily., Disp: 90 tablet, Rfl: 3   SYNTHROID 88 MCG tablet, TAKE 1 TABLET BY MOUTH EVERY DAY MONDAY TO SATURDAY AND OFF ON SUNDAYS, Disp: 90 tablet, Rfl: 0   trolamine salicylate (ASPERCREME) 10 % cream, Apply 1 application topically daily as needed for muscle pain., Disp: , Rfl:    Allergies  Allergen Reactions   Shellfish Allergy Anaphylaxis and Hives   Shellfish-Derived Products Anaphylaxis     Review of Systems  Constitutional: Negative.   Eyes:  Negative for blurred vision.  Respiratory: Negative.    Cardiovascular: Negative.  Negative for chest pain.  Gastrointestinal: Negative.   Musculoskeletal:  Positive for arthralgias and back pain.       She c/o low back pain. Sx started several months ago. She has been to [facility] which has improved her sx somewhat. She does c/o numbness in her feet. Denies LE weakness. Denies urinary/fecal incontinence.   Neurological:  Positive for numbness. Negative for headaches.       She c/o bilateral numbness in her toes. She is not sure what could be contributing to her sx.   Psychiatric/Behavioral: Negative.      Today's  Vitals   06/25/21 1114  BP: 138/70  Pulse: 67  Temp: 97.9 F (36.6 C)  TempSrc: Oral  Weight: 161 lb (73 kg)  Height: 4' 9.2" (1.453 m)   Body mass index is 34.6 kg/m.   Objective:  Physical Exam Vitals and nursing note reviewed.  Constitutional:      Appearance: Normal appearance.  HENT:     Head: Normocephalic and atraumatic.     Nose:     Comments: Masked     Mouth/Throat:     Comments: Masked  Eyes:     Extraocular Movements: Extraocular movements intact.  Cardiovascular:     Rate and Rhythm: Normal rate and regular rhythm.     Heart sounds: Normal heart sounds.  Pulmonary:     Effort: Pulmonary effort is normal.     Breath sounds: Normal breath sounds.  Musculoskeletal:  Cervical back: Normal range of motion.  Skin:    General: Skin is warm.  Neurological:     General: No focal deficit present.     Mental Status: She is alert.  Psychiatric:        Mood and Affect: Mood normal.        Behavior: Behavior normal.        Assessment And Plan:     1. Hypertensive nephropathy Comments: Chronic, fair control. Goal BP is less than 130/80. She is encouraged to limit her sodium intake and to aim for at least 150 minutes of exercise per week.  - AMB Referral to [place]  2. Type 2 diabetes mellitus with stage 3a chronic kidney disease, without long-term current use of insulin (HCC) Comments: Chronic, I will check labs as listed below. Encouraged to limit her intake of sweetened beverages, including diet drinks.  - Hemoglobin A1c  3. Chronic bilateral low back pain without sciatica Comments: Chronic, she was given stretching exercises to perform daily. Will consider Chiro eval should her sx persist. Will consider MRI-LS if her sx persist.   4. Paresthesia and pain of extremity Comments: Likely related to LBP. Again, advised to c/w stretching exercises. May benefit from gabapentin nightly if sx persist.   5. Bilateral primary osteoarthritis of  knee Comments: I suggested referral for PRP evaluation. She is in agreement with her treatment plan.  - Ambulatory referral to Pain Clinic  6. Primary hypothyroidism Comments: I will check thyroid panel and adjust meds as needed.  - TSH + free T4  7. Mild intermittent asthma without complication Comments: Chronic, she agrees to [facility] referral for medication assistance.  - AMB Referral to [place]  8. Polypharmacy - [facility]  The [facility] pharmacogenomics process and benefits of testing was discussed with the patient. After the discussion, the patient made an informed consent to testing and the sample was collected and mailed to the external lab.  Patient was given opportunity to ask questions. Patient verbalized understanding of the plan and was able to repeat key elements of the plan. All questions were answered to their satisfaction.   I, [name], MD, have reviewed all documentation for this visit. The documentation on 06/25/21 for the exam, diagnosis, procedures, and orders are all accurate and complete.   IF YOU HAVE BEEN REFERRED TO A SPECIALIST, IT MAY TAKE 1-2 WEEKS TO SCHEDULE/PROCESS THE REFERRAL. IF YOU HAVE NOT HEARD FROM US/SPECIALIST IN TWO WEEKS, PLEASE GIVE [place] A CALL AT [phone].   THE PATIENT IS ENCOURAGED TO PRACTICE SOCIAL DISTANCING DUE TO THE COVID-19 PANDEMIC.

## 2021-06-26 LAB — HEMOGLOBIN A1C
Est. average glucose Bld gHb Est-mCnc: 108 mg/dL
Hgb A1c MFr Bld: 5.4 % (ref 4.8–5.6)

## 2021-06-26 LAB — TSH+FREE T4
Free T4: 1.43 ng/dL (ref 0.82–1.77)
TSH: 1.02 u[IU]/mL (ref 0.450–4.500)

## 2021-06-27 ENCOUNTER — Telehealth: Payer: Self-pay

## 2021-06-27 ENCOUNTER — Ambulatory Visit: Payer: Medicare HMO | Admitting: Hematology

## 2021-06-27 ENCOUNTER — Ambulatory Visit (INDEPENDENT_AMBULATORY_CARE_PROVIDER_SITE_OTHER): Payer: Medicare HMO

## 2021-06-27 ENCOUNTER — Other Ambulatory Visit: Payer: Medicare HMO

## 2021-06-27 ENCOUNTER — Telehealth: Payer: Medicare HMO

## 2021-06-27 DIAGNOSIS — M9905 Segmental and somatic dysfunction of pelvic region: Secondary | ICD-10-CM | POA: Diagnosis not present

## 2021-06-27 DIAGNOSIS — M5137 Other intervertebral disc degeneration, lumbosacral region: Secondary | ICD-10-CM | POA: Diagnosis not present

## 2021-06-27 DIAGNOSIS — N1831 Chronic kidney disease, stage 3a: Secondary | ICD-10-CM

## 2021-06-27 DIAGNOSIS — M0579 Rheumatoid arthritis with rheumatoid factor of multiple sites without organ or systems involvement: Secondary | ICD-10-CM | POA: Diagnosis not present

## 2021-06-27 DIAGNOSIS — D508 Other iron deficiency anemias: Secondary | ICD-10-CM

## 2021-06-27 DIAGNOSIS — E1122 Type 2 diabetes mellitus with diabetic chronic kidney disease: Secondary | ICD-10-CM | POA: Diagnosis not present

## 2021-06-27 DIAGNOSIS — M5136 Other intervertebral disc degeneration, lumbar region: Secondary | ICD-10-CM | POA: Diagnosis not present

## 2021-06-27 DIAGNOSIS — N183 Chronic kidney disease, stage 3 unspecified: Secondary | ICD-10-CM

## 2021-06-27 DIAGNOSIS — I129 Hypertensive chronic kidney disease with stage 1 through stage 4 chronic kidney disease, or unspecified chronic kidney disease: Secondary | ICD-10-CM

## 2021-06-27 DIAGNOSIS — M47814 Spondylosis without myelopathy or radiculopathy, thoracic region: Secondary | ICD-10-CM | POA: Diagnosis not present

## 2021-06-27 DIAGNOSIS — M9902 Segmental and somatic dysfunction of thoracic region: Secondary | ICD-10-CM | POA: Diagnosis not present

## 2021-06-27 DIAGNOSIS — M9903 Segmental and somatic dysfunction of lumbar region: Secondary | ICD-10-CM | POA: Diagnosis not present

## 2021-06-27 NOTE — Patient Instructions (Signed)
Visit Information  PATIENT GOALS:  Goals Addressed      Anemia Managed   On track    Timeframe:  Long-Range Goal Priority:  Medium Start Date:  11/01/20                           Expected End Date:  11/01/21  Follow up date: 10/22/21  Continue to self administers medications as prescribed Continue to attend all scheduled provider appointments Continue to call pharmacy for medication refills Call provider office for new concerns or questions Follow up with Dr. Irene Limbo as directed  Adhere to dietary recommendations with eating iron rich foods                       Chronic Kidney Disease - disease progression prevented or minimized   On track    Timeframe:  Long-Range Goal Priority:  Medium Start Date: 01/13/21                            Expected End Date:  01/13/22  Follow Up Date: 10/22/21  Self Care Activities:  Continue to adhere to MD recommendations for CKD  Continue to keep all scheduled follow up appointments Take medications as directed  Let your healthcare team know if you are unable to take your medications Call your pharmacy for refills at least 7 days prior to running out of medication Patient Goals: - increase water to 64 oz daily - keep BP under good control per parameters set by MD                   Chronic Low Back Pain Managed   On track    Timeframe:  Long-Range Goal Priority:  High Start Date: 01/27/21                            Expected End Date: 01/27/22  Follow up date: 10/22/21      Patient Goals: - schedule new patient appointment with Dr. Drema Dallas , MD - continue to adhere to prescribed HEP            Hypertension Monitored   On track    Timeframe:  Long-Range Goal Priority:  High Start Date:  01/13/21                          Expected End Date: 01/13/22    Follow up date: 10/22/21    Patient Goals: - check blood pressure 3 times per week - choose a place to take my blood pressure (home, clinic or office, retail store) - write blood  pressure results in a log or diary - learn about high blood pressure                     The patient verbalized understanding of instructions, educational materials, and care plan provided today and declined offer to receive copy of patient instructions, educational materials, and care plan.   Telephone follow up appointment with care management team member scheduled for: 10/22/21  Barb Merino, RN, BSN, CCM Care Management Coordinator [place] [facility]  Direct Phone: [phone]

## 2021-06-27 NOTE — Telephone Encounter (Signed)
  Care Management   Follow Up Note   06/27/2021 Name: Hannah [name] MRN: [id number] DOB: Nov 11, 1945   Referred by: Glendale Chard, MD Reason for referral : Chronic Care Management (RN CM Follow up call - 2nd attempt )   An unsuccessful telephone outreach was attempted today. The patient was referred to the case management team for assistance with care management and care coordination. I spoke with [name] briefly today, unfortunately she is not available to speak with me but will try to give me a call back later today.   Follow Up Plan: Telephone follow up appointment with care management team member scheduled for: 07/09/21  Barb Merino, RN, BSN, CCM Care Management Coordinator [place] [facility]  Direct Phone: [phone]

## 2021-06-27 NOTE — Chronic Care Management (AMB) (Signed)
Chronic Care Management   CCM RN Visit Note  06/27/2021 Name: Hannah [name] MRN: [id number] DOB: [birth year]  Subjective: [name] is a 75 y.o. year old female who is a primary care patient of Glendale Chard, MD. The care management team was consulted for assistance with disease management and care coordination needs.    Engaged with patient by telephone for follow up visit in response to provider referral for case management and/or care coordination services.   Consent to Services:  The patient was given information about Chronic Care Management services, agreed to services, and gave verbal consent prior to initiation of services.  Please see initial visit note for detailed documentation.   Patient agreed to services and verbal consent obtained.   Assessment: Review of patient past medical history, allergies, medications, health status, including review of consultants reports, laboratory and other test data, was performed as part of comprehensive evaluation and provision of chronic care management services.   SDOH (Social Determinants of Health) assessments and interventions performed:  Yes, no acute needs   CCM Care Plan  Allergies  Allergen Reactions   Shellfish Allergy Anaphylaxis and Hives   Shellfish-Derived Products Anaphylaxis    Outpatient Encounter Medications as of 06/27/2021  Medication Sig   Accu-Chek Softclix Lancets lancets Use to check blood sugars daily E11.69   albuterol (PROVENTIL HFA;VENTOLIN HFA) 108 (90 Base) MCG/ACT inhaler Inhale 2 puffs into the lungs every 6 (six) hours as needed for wheezing or shortness of breath.   Alcohol Swabs (ALCOHOL PADS) 70 % PADS Use as directed to check blood sugars 1 time per day dx: e11.22   allopurinol (ZYLOPRIM) 100 MG tablet TAKE 1 TABLET BY MOUTH EVERY DAY FOR GOUT   amLODipine (NORVASC) 2.5 MG tablet TAKE 1 TABLET(2.5 MG) BY MOUTH DAILY   apixaban (ELIQUIS) 5 MG TABS tablet Take 1 tablet (5 mg total) by  mouth 2 (two) times daily.   Ascorbic Acid (VITAMIN C) 1000 MG tablet Take 1,000 mg by mouth daily.   atorvastatin (LIPITOR) 80 MG tablet TAKE 1 TABLET BY MOUTH EVERY DAY   budesonide-formoterol (SYMBICORT) 160-4.5 MCG/ACT inhaler INHALE 2 PUFFS BY MOUTH TWICE DAILY IN THE MORNING AND EVENING (Patient taking differently: as needed. INHALE 2 PUFFS BY MOUTH TWICE DAILY IN THE MORNING AND EVENING)   Calcium Carb-Cholecalciferol 600-800 MG-UNIT TABS Take 1 tablet by mouth daily.    Cholecalciferol (VITAMIN D PO) Take 2,000 Units by mouth daily.   cyanocobalamin 1000 MCG tablet Take 1,000 mcg by mouth daily.   denosumab (PROLIA) 60 MG/ML SOSY injection Inject 60 mg into the skin every 6 (six) months. Appt on 06/07/21   glucose blood (ACCU-CHEK GUIDE) test strip Use to check blood sugars daily E11.69   hydroxychloroquine (PLAQUENIL) 200 MG tablet Take 1 tablet (200 mg total) by mouth daily.   Hypromellose (ARTIFICIAL TEARS OP) Apply 1 drop to eye daily as needed (dry eyes).   irbesartan (AVAPRO) 300 MG tablet TAKE 1 TABLET(300 MG) BY MOUTH DAILY   iron polysaccharides (NIFEREX) 150 MG capsule Take 150 mg by mouth daily. (Patient not taking: No sig reported)   Liniments (BEN GAY EX) Apply 1 application topically daily as needed (pain).   metoprolol succinate (TOPROL-XL) 25 MG 24 hr tablet Take 1 tablet (25 mg total) by mouth daily.   SYNTHROID 88 MCG tablet TAKE 1 TABLET BY MOUTH EVERY DAY MONDAY TO SATURDAY AND OFF ON SUNDAYS   trolamine salicylate (ASPERCREME) 10 % cream Apply 1 application topically daily  as needed for muscle pain.   No facility-administered encounter medications on file as of 06/27/2021.    Patient Active Problem List   Diagnosis Date Noted   Chronic bilateral low back pain without sciatica 06/25/2021   Paresthesia and pain of extremity 06/25/2021   Atrial fibrillation ([place]) 04/16/2021   Demand ischemia ([place]) 03/26/2021   Unspecified atrial fibrillation ([place]) 03/26/2021    COVID-19 virus infection 03/26/2021   PVC (premature ventricular contraction) 05/06/2020   Type 2 diabetes mellitus with stage 3 chronic kidney disease, without long-term current use of insulin ([place]) 03/20/2020   Palpitations 03/20/2020   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/2020   Primary hypothyroidism 03/20/2020   Vitiligo 03/20/2020   Iron deficiency anemia 08/12/2017   Osteoporosis 10/02/2016   Systemic lupus erythematosus ([place]) 10/02/2016   Rheumatoid arthritis involving multiple sites with positive rheumatoid factor ([place]) 10/02/2016   High risk medication use 10/02/2016   History of chronic kidney disease 10/02/2016   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/2016   Primary osteoarthritis of both knees 10/02/2016   Vitamin D deficiency 10/02/2016   History of diabetes mellitus 10/02/2016   History of hypertension 10/02/2016   History of asthma 10/02/2016   Absolute anemia    MGUS (monoclonal gammopathy of unknown significance)    Normocytic anemia 03/21/2015   Essential hypertension 03/20/2015   Abnormal CT of the chest 07/30/2012   Asthma 06/19/2012    Conditions to be addressed/monitored: CKD III, hypertensive nephropathy, DM, RA, iron deficiency Anemia    Care Plan : Anemia  Updates made by Lynne Logan, RN since 06/27/2021 12:00 AM     Problem: Anemia Managed   Priority: Medium     Long-Range Goal: Anemia Managed   Start Date: 11/01/2020  Expected End Date: 11/01/2021  Recent Progress: On track  Priority: Medium  Note:   CBC Latest Ref Rng & Units 05/23/2021 04/16/2021 04/08/2021  WBC 3.8 - 10.8 Thousand/uL 4.7 5.5 6.5  Hemoglobin 11.7 - 15.5 g/dL 10.7(L) 9.7(L) 10.9(L)  Hematocrit 35.0 - 45.0 % 32.1(L) 29.5(L) 32.8(L)  Platelets 140 - 400 Thousand/uL 159 183 245   Iron/TIBC/Ferritin/ %Sat    Component Value Date/Time   IRON 51 04/16/2021 0730   IRON 75 06/19/2020 1429   IRON 84 02/16/2017 0829   TIBC 249 04/16/2021 0730   TIBC 246 (L)  06/19/2020 1429   TIBC 289 02/16/2017 0829   FERRITIN 62 04/16/2021 0730   FERRITIN 275 (H) 06/19/2020 1429   FERRITIN 21 08/12/2017 0808   IRONPCTSAT 21 04/16/2021 0730   IRONPCTSAT 30 06/19/2020 1429   IRONPCTSAT 29 02/16/2017 0829   Current Barriers:  Knowledge deficits related to self health management of anemia or bleeding Chronic Disease Management support and education needs related to CKD III, hypertensive nephropathy, DM, RA, iron deficiency Anemia   Nurse Case Manager Clinical Goal(s):  patient will take all medications exactly as prescribed and will call provider for medication related questions patient will verbalize basic understanding of Iron Deficiency Anemia  disease process and self health management plan Interventions:  Collaboration with Glendale Chard, MD regarding development and update of comprehensive plan of care as evidenced by provider attestation and co-signature Inter-disciplinary care team collaboration (see longitudinal plan of care) Basic overview and discussion of anemia/bleeding disorder or acute disease state Medications reviewed recommended promotion of rest and energy-conserving measures to manage fatigue, such as balancing activity with periods of rest Determined Dr. Irene Limbo continues to manage patient's Anemia, patient reports recent Iron infusion was  needed, she tolerated well, still has some fatigue but is able to continue to perform self care and exercises 3 x weekly Reviewed scheduled/upcoming provider appointments including: follow up appointment with Dr. Irene Limbo scheduled for 07/16/21 @2 :20 PM  Discussed plans with patient for ongoing care management follow up and provided patient with direct contact information for care management team Patient Self-Care Activities: Continue to self administers medications as prescribed Continue to attend all scheduled provider appointments Continue to call pharmacy for medication refills Call provider office for new  concerns or questions Patient Goals:  Follow up with Dr. Irene Limbo as directed  Adhere to dietary recommendations with eating iron rich foods  Follow Up Plan: Telephone follow up appointment with care management team member scheduled for: 10/22/21     Care Plan : Chronic Kidney Disease  Updates made by Lynne Logan, RN since 06/27/2021 12:00 AM     Problem: Chronic Kidney Disease   Priority: Medium     Long-Range Goal: Chronic Kidney Disease - disease progression prevented or minimized   Start Date: 01/13/2021  Expected End Date: 01/13/2022  Recent Progress: On track  Priority: Medium  Note:   Objective:  Lab Results  Component Value Date   HGBA1C 5.4 06/25/2021   Lab Results  Component Value Date   CREATININE 1.07 (H) 05/23/2021   CREATININE 1.10 (H) 04/16/2021   CREATININE 1.21 (H) 04/08/2021   Lab Results  Component Value Date   EGFR 54 (L) 05/23/2021    Current Barriers:  Ineffective Self Health Maintenance  Clinical Goal(s):  Collaboration with Glendale Chard, MD regarding development and update of comprehensive plan of care as evidenced by provider attestation and co-signature Inter-disciplinary care team collaboration (see longitudinal plan of care) patient will work with care management team to address care coordination and chronic disease management needs related to Disease Management Educational Needs Care Coordination Medication Management and Education Psychosocial Support   Interventions:  Evaluation of current treatment plan related to CKD Stage III , self-management and patient's adherence to plan as established by provider. Collaboration with Glendale Chard, MD regarding development and update of comprehensive plan of care as evidenced by provider attestation       and co-signature Inter-disciplinary care team collaboration (see longitudinal plan of care) Provided education to patient about basic disease process related to Chronic Kidney Disease  Review  of patient status, including review of consultants reports, relevant laboratory and other test results, and medications completed. Reviewed medications with patient and discussed importance of medication adherence Educated patient on dietary and exercise recommendations, increase water to 64 oz daily unless otherwise directed  Discussed plans with patient for ongoing care management follow up and provided patient with direct contact information for care management team Self Care Activities:  Continue to adhere to MD recommendations for CKD  Continue to keep all scheduled follow up appointments Take medications as directed  Let your healthcare team know if you are unable to take your medications Call your pharmacy for refills at least 7 days prior to running out of medication Patient Goals: - increase water to 64 oz daily - keep BP under good control per parameters set by MD       Follow Up Plan: Telephone follow up appointment with care management team member scheduled for: 10/22/21     Care Plan : Nonspecific Low Back Pain (Adult)  Updates made by Lynne Logan, RN since 06/27/2021 12:00 AM     Problem: Chronic Low Back Pain   Priority:  High     Long-Range Goal: Chronic Low Back Pain Managed   Start Date: 01/27/2021  Expected End Date: 01/27/2022  Recent Progress: On track  Priority: High  Note:   Current Barriers:  Knowledge Deficits related to managing acute/chronic pain Chronic Disease Management support and education needs related to acute/chronic pain Nurse Case Manager Clinical Goal(s):  patient will verbalize understanding of plan for managing pain and following up with outpatient PT patient will verbalize acceptable level of pain relief and ability to engage in desired activities Interventions:  06/27/21 completed successful outbound call with patient  Collaboration with Glendale Chard, MD regarding development and update of comprehensive plan of care as evidenced by  provider attestation and co-signature Inter-disciplinary care team collaboration (see longitudinal plan of care) Evaluation of current treatment plan related to acute right low back pain and patient's adherence to plan as established by provider. Determined patient completed outpatient PT and dry needling, she reports adherence to her HEP 3 x weekly  Discussed patient continues to receive Chiropractor therapy monthly with good benefits Discussed she continues to have low back pain despite her recent efforts to manage this pain, Dr. Baird Cancer made aware during 06/25/21 visit   Reviewed referral for Dr. Drema Dallas, MD, patient is aware this referral is in progress Encouraged patient to continue to adhere to her HEP, balancing her activity with rest  Discussed plans with patient for ongoing care management follow up and provided patient with direct contact information for care management team Patient Self Care Activities:  Self-administers medications as prescribed Attends all scheduled provider appointments Calls pharmacy for medication refills Calls provider office for new concerns or questions Patient Goals: - schedule new patient appointment with Dr. Drema Dallas , MD - continue to adhere to prescribed HEP  Follow Up Plan: Telephone follow up appointment with care management team member scheduled for: 10/22/21       Plan:Telephone follow up appointment with care management team member scheduled for:  10/22/21  Barb Merino, RN, BSN, CCM Care Management Coordinator [place] [facility]  Direct Phone: [phone]

## 2021-07-03 DIAGNOSIS — M5136 Other intervertebral disc degeneration, lumbar region: Secondary | ICD-10-CM | POA: Diagnosis not present

## 2021-07-03 DIAGNOSIS — M9903 Segmental and somatic dysfunction of lumbar region: Secondary | ICD-10-CM | POA: Diagnosis not present

## 2021-07-03 DIAGNOSIS — M9905 Segmental and somatic dysfunction of pelvic region: Secondary | ICD-10-CM | POA: Diagnosis not present

## 2021-07-03 DIAGNOSIS — M5137 Other intervertebral disc degeneration, lumbosacral region: Secondary | ICD-10-CM | POA: Diagnosis not present

## 2021-07-03 DIAGNOSIS — M47814 Spondylosis without myelopathy or radiculopathy, thoracic region: Secondary | ICD-10-CM | POA: Diagnosis not present

## 2021-07-03 DIAGNOSIS — M545 Low back pain, unspecified: Secondary | ICD-10-CM | POA: Diagnosis not present

## 2021-07-03 DIAGNOSIS — M9902 Segmental and somatic dysfunction of thoracic region: Secondary | ICD-10-CM | POA: Diagnosis not present

## 2021-07-07 ENCOUNTER — Telehealth: Payer: Self-pay | Admitting: *Deleted

## 2021-07-07 NOTE — Telephone Encounter (Signed)
Our office received what looks like could be a clearance request though form states [place].   Pt is currently a pt of [facility]. Form from [facility] is checked off for Routine Dental Care and Other Procedures. I tried to call the dental office to clarify procedures being done. Dentist name is Serena Colonel, DMD, though dental office is closed today.

## 2021-07-08 ENCOUNTER — Telehealth: Payer: Self-pay

## 2021-07-08 DIAGNOSIS — I4891 Unspecified atrial fibrillation: Secondary | ICD-10-CM | POA: Diagnosis not present

## 2021-07-08 DIAGNOSIS — M5459 Other low back pain: Secondary | ICD-10-CM | POA: Diagnosis not present

## 2021-07-08 DIAGNOSIS — D6869 Other thrombophilia: Secondary | ICD-10-CM | POA: Diagnosis not present

## 2021-07-08 DIAGNOSIS — M545 Low back pain, unspecified: Secondary | ICD-10-CM | POA: Diagnosis not present

## 2021-07-08 NOTE — Telephone Encounter (Signed)
I s/w Claiborne Billings with [facility]. I confirmed pre op information needed.     [facility] Pre-operative Risk Assessment    Patient Name: Hannah [name]  DOB: [birth year] MRN: [id number]  HEARTCARE STAFF:  - IMPORTANT!!!!!! Under Visit Info/Reason for Call, type in Other and utilize the format Clearance MM/DD/YY or Clearance TBD. Do not use dashes or single digits. - Please review there is not already an duplicate clearance open for this procedure. - If request is for dental extraction, please clarify the # of teeth to be extracted. - If the patient is currently at the dentist's office, call Pre-Op Callback Staff (MA/nurse) to input urgent request.  - If the patient is not currently in the dentist office, please route to the Pre-Op pool.  Request for surgical clearance:  What type of surgery is being performed? DENTAL HYGIENE CLEANING   When is this surgery scheduled? TBD  What type of clearance is required (medical clearance vs. Pharmacy clearance to hold med vs. Both)? BOTH  Are there any medications that need to be held prior to surgery and how long? [place] name and name of physician performing surgery? [facility]; DR, MARIA ISABEL REGO, DMD  What is the office phone number? [phone]   7.   What is the office fax number? [phone]  8.   Anesthesia type (None, local, MAC, general) ? NONE TO BE USED PER [facility] WITH [facility]    Julaine Hua 07/08/2021, 11:34 AM  _________________________________________________________________   (provider comments below)

## 2021-07-08 NOTE — Chronic Care Management (AMB) (Signed)
    Called [name], No answer, left message of appointment on 07-09-2021 at 2:00 via telephone visit with [name], Pharm D. Notified to have all medications, supplements, blood pressure and/or blood sugar logs available during appointment and to return call if need to reschedule.    Care Gaps: Covid booster overdue  Star Rating Drug: Atorvastatin 80 mg- Last filled 04-09-2021 90 DS [facility] Irbesartan 300 mg- Last filled 04-06-2021 90 DS [facility]  Any gaps in medications fill history? Yes  [place] Pharmacist Assistant [phone]

## 2021-07-08 NOTE — Telephone Encounter (Signed)
   Patient Name: Hannah [name]  DOB: October 31, 1945 MRN: [id number]  Primary Cardiologist: Mertie Moores, MD  Chart reviewed as part of pre-operative protocol coverage.   Simple dental extractions and cleaning are considered low risk procedures per guidelines and generally do not require any specific cardiac clearance. It is also generally accepted that for simple extractions and dental cleanings, there is no need to interrupt blood thinner therapy.  SBE prophylaxis is not required for the patient from a cardiac standpoint.  I will route this recommendation to the requesting party via Epic fax function and remove from pre-op pool.  Please call with questions.  [place], [place] 07/08/2021, 3:08 PM

## 2021-07-09 ENCOUNTER — Telehealth: Payer: Medicare HMO

## 2021-07-10 ENCOUNTER — Telehealth: Payer: Self-pay

## 2021-07-10 NOTE — Chronic Care Management (AMB) (Signed)
Chronic Care Management Pharmacy Assistant   Name: Hannah [name]  MRN: [id number] DOB: 10/22/1945   Reason for Encounter: Disease State/ Diabetes  Recent office visits:  06-25-2021 Kellie Simmering, LPN. Annual wellness visit.  06-25-2021 Glendale Chard, MD. A1C= 5.4. Referral placed to pain clinic.  06-27-2021 Barb Merino, RN (CCM)  Recent consult visits:  06-04-2021 Dionisio David (Chiropractic). Unable to view encounter.  06-13-2021 Bo Merino, MD (Rheumatology). Prolia injection given.  Hospital visits:  None in previous 6 months  Medications: Outpatient Encounter Medications as of 07/10/2021  Medication Sig   Accu-Chek Softclix Lancets lancets Use to check blood sugars daily E11.69   albuterol (PROVENTIL HFA;VENTOLIN HFA) 108 (90 Base) MCG/ACT inhaler Inhale 2 puffs into the lungs every 6 (six) hours as needed for wheezing or shortness of breath.   Alcohol Swabs (ALCOHOL PADS) 70 % PADS Use as directed to check blood sugars 1 time per day dx: e11.22   allopurinol (ZYLOPRIM) 100 MG tablet TAKE 1 TABLET BY MOUTH EVERY DAY FOR GOUT   amLODipine (NORVASC) 2.5 MG tablet TAKE 1 TABLET(2.5 MG) BY MOUTH DAILY   apixaban (ELIQUIS) 5 MG TABS tablet Take 1 tablet (5 mg total) by mouth 2 (two) times daily.   Ascorbic Acid (VITAMIN C) 1000 MG tablet Take 1,000 mg by mouth daily.   atorvastatin (LIPITOR) 80 MG tablet TAKE 1 TABLET BY MOUTH EVERY DAY   budesonide-formoterol (SYMBICORT) 160-4.5 MCG/ACT inhaler INHALE 2 PUFFS BY MOUTH TWICE DAILY IN THE MORNING AND EVENING (Patient taking differently: as needed. INHALE 2 PUFFS BY MOUTH TWICE DAILY IN THE MORNING AND EVENING)   Calcium Carb-Cholecalciferol 600-800 MG-UNIT TABS Take 1 tablet by mouth daily.    Cholecalciferol (VITAMIN D PO) Take 2,000 Units by mouth daily.   cyanocobalamin 1000 MCG tablet Take 1,000 mcg by mouth daily.   denosumab (PROLIA) 60 MG/ML SOSY injection Inject 60 mg into the skin every 6 (six)  months. Appt on 06/07/21   glucose blood (ACCU-CHEK GUIDE) test strip Use to check blood sugars daily E11.69   hydroxychloroquine (PLAQUENIL) 200 MG tablet Take 1 tablet (200 mg total) by mouth daily.   Hypromellose (ARTIFICIAL TEARS OP) Apply 1 drop to eye daily as needed (dry eyes).   irbesartan (AVAPRO) 300 MG tablet TAKE 1 TABLET(300 MG) BY MOUTH DAILY   iron polysaccharides (NIFEREX) 150 MG capsule Take 150 mg by mouth daily. (Patient not taking: No sig reported)   Liniments (BEN GAY EX) Apply 1 application topically daily as needed (pain).   metoprolol succinate (TOPROL-XL) 25 MG 24 hr tablet Take 1 tablet (25 mg total) by mouth daily.   SYNTHROID 88 MCG tablet TAKE 1 TABLET BY MOUTH EVERY DAY MONDAY TO SATURDAY AND OFF ON SUNDAYS   trolamine salicylate (ASPERCREME) 10 % cream Apply 1 application topically daily as needed for muscle pain.   No facility-administered encounter medications on file as of 07/10/2021.   Recent Relevant Labs: Lab Results  Component Value Date/Time   HGBA1C 5.4 06/25/2021 12:17 PM   HGBA1C 5.6 12/30/2020 12:54 PM   MICROALBUR 150 12/30/2020 01:02 PM   MICROALBUR 80 10/31/2019 12:13 PM    Kidney Function Lab Results  Component Value Date/Time   CREATININE 1.07 (H) 05/23/2021 01:33 PM   CREATININE 1.10 (H) 04/16/2021 07:29 AM   CREATININE 1.21 (H) 04/08/2021 04:29 PM   CREATININE 1.24 (H) 03/28/2021 03:44 AM   CREATININE 1.37 (H) 12/19/2020 02:26 PM   CREATININE 1.4 (H) 08/12/2017 08:08 AM  CREATININE 1.2 (H) 02/16/2017 08:29 AM   GFRNONAA 52 (L) 04/16/2021 07:29 AM   GFRNONAA 38 (L) 12/19/2020 02:26 PM   GFRAA 44 (L) 12/19/2020 02:26 PM    Current antihyperglycemic regimen:  None  What recent interventions/DTPs have been made to improve glycemic control:  Educated on A1c and blood sugar goals; Counseled to check feet daily and get yearly eye exams -Counseled to check feet daily and get yearly eye exams -Counseled on diet and exercise  extensively  Have there been any recent hospitalizations or ED visits since last visit with CPP? No  Patient denies hypoglycemic symptoms  Patient denies hyperglycemic symptoms  How often are you checking your blood sugar? 3 times weekly  What are your blood sugars ranging?  Fasting: 88 Before meals: None After meals: None Bedtime: None  During the week, how often does your blood glucose drop below 70? Never  Are you checking your feet daily/regularly? Patient states daily  Adherence Review: Is the patient currently on a STATIN medication? Yes Is the patient currently on ACE/ARB medication? Yes Does the patient have >5 day gap between last estimated fill dates? Yes  NOTES: Patient stated she never received a call on her appointment on 07-09-2021 with [name] CPP. Rescheduled appointment to December.  Care Gaps: Covid booster overdue A1C 06-25-2021 5.4 BP 06-25-2021 138/70  Star Rating Drugs: Atorvastatin 80 mg- Last filled 04-09-2021 90 DS [facility] Irbersartan 300 mg- Last filled 04-06-2021 90 DS [facility] Pharmacist Assistant [phone]

## 2021-07-11 DIAGNOSIS — M47814 Spondylosis without myelopathy or radiculopathy, thoracic region: Secondary | ICD-10-CM | POA: Diagnosis not present

## 2021-07-11 DIAGNOSIS — M5136 Other intervertebral disc degeneration, lumbar region: Secondary | ICD-10-CM | POA: Diagnosis not present

## 2021-07-11 DIAGNOSIS — M9902 Segmental and somatic dysfunction of thoracic region: Secondary | ICD-10-CM | POA: Diagnosis not present

## 2021-07-11 DIAGNOSIS — M5137 Other intervertebral disc degeneration, lumbosacral region: Secondary | ICD-10-CM | POA: Diagnosis not present

## 2021-07-11 DIAGNOSIS — M9905 Segmental and somatic dysfunction of pelvic region: Secondary | ICD-10-CM | POA: Diagnosis not present

## 2021-07-11 DIAGNOSIS — M9903 Segmental and somatic dysfunction of lumbar region: Secondary | ICD-10-CM | POA: Diagnosis not present

## 2021-07-14 ENCOUNTER — Inpatient Hospital Stay: Payer: Medicare HMO | Attending: Hematology

## 2021-07-14 ENCOUNTER — Other Ambulatory Visit: Payer: Self-pay

## 2021-07-14 DIAGNOSIS — D472 Monoclonal gammopathy: Secondary | ICD-10-CM | POA: Diagnosis not present

## 2021-07-14 DIAGNOSIS — M329 Systemic lupus erythematosus, unspecified: Secondary | ICD-10-CM | POA: Insufficient documentation

## 2021-07-14 DIAGNOSIS — E119 Type 2 diabetes mellitus without complications: Secondary | ICD-10-CM | POA: Diagnosis not present

## 2021-07-14 DIAGNOSIS — D649 Anemia, unspecified: Secondary | ICD-10-CM | POA: Diagnosis not present

## 2021-07-14 DIAGNOSIS — Z79899 Other long term (current) drug therapy: Secondary | ICD-10-CM | POA: Diagnosis not present

## 2021-07-14 DIAGNOSIS — I10 Essential (primary) hypertension: Secondary | ICD-10-CM | POA: Insufficient documentation

## 2021-07-14 LAB — CMP (CANCER CENTER ONLY)
ALT: 18 U/L (ref 0–44)
AST: 22 U/L (ref 15–41)
Albumin: 3.7 g/dL (ref 3.5–5.0)
Alkaline Phosphatase: 54 U/L (ref 38–126)
Anion gap: 10 (ref 5–15)
BUN: 23 mg/dL (ref 8–23)
CO2: 26 mmol/L (ref 22–32)
Calcium: 9 mg/dL (ref 8.9–10.3)
Chloride: 107 mmol/L (ref 98–111)
Creatinine: 1.13 mg/dL — ABNORMAL HIGH (ref 0.44–1.00)
GFR, Estimated: 51 mL/min — ABNORMAL LOW (ref >60)
Glucose, Bld: 99 mg/dL (ref 70–99)
Potassium: 4 mmol/L (ref 3.5–5.1)
Sodium: 143 mmol/L (ref 135–145)
Total Bilirubin: 0.6 mg/dL (ref 0.3–1.2)
Total Protein: 6.5 g/dL (ref 6.5–8.1)

## 2021-07-14 LAB — IRON AND TIBC
Iron: 61 ug/dL (ref 41–142)
Saturation Ratios: 27 % (ref 21–57)
TIBC: 225 ug/dL — ABNORMAL LOW (ref 236–444)
UIBC: 164 ug/dL (ref 120–384)

## 2021-07-14 LAB — CBC WITH DIFFERENTIAL/PLATELET
Abs Immature Granulocytes: 0.01 10*3/uL (ref 0.00–0.07)
Basophils Absolute: 0 10*3/uL (ref 0.0–0.1)
Basophils Relative: 1 %
Eosinophils Absolute: 0.5 10*3/uL (ref 0.0–0.5)
Eosinophils Relative: 11 %
HCT: 31.6 % — ABNORMAL LOW (ref 36.0–46.0)
Hemoglobin: 10.2 g/dL — ABNORMAL LOW (ref 12.0–15.0)
Immature Granulocytes: 0 %
Lymphocytes Relative: 15 %
Lymphs Abs: 0.7 10*3/uL (ref 0.7–4.0)
MCH: 32.2 pg (ref 26.0–34.0)
MCHC: 32.3 g/dL (ref 30.0–36.0)
MCV: 99.7 fL (ref 80.0–100.0)
Monocytes Absolute: 0.4 10*3/uL (ref 0.1–1.0)
Monocytes Relative: 8 %
Neutro Abs: 3.1 10*3/uL (ref 1.7–7.7)
Neutrophils Relative %: 65 %
Platelets: 168 10*3/uL (ref 150–400)
RBC: 3.17 MIL/uL — ABNORMAL LOW (ref 3.87–5.11)
RDW: 13.3 % (ref 11.5–15.5)
WBC: 4.7 10*3/uL (ref 4.0–10.5)
nRBC: 0 % (ref 0.0–0.2)

## 2021-07-14 LAB — FERRITIN: Ferritin: 229 ng/mL (ref 11–307)

## 2021-07-15 ENCOUNTER — Telehealth: Payer: Medicare HMO

## 2021-07-16 ENCOUNTER — Other Ambulatory Visit: Payer: Medicare HMO

## 2021-07-16 ENCOUNTER — Inpatient Hospital Stay: Payer: Medicare HMO | Admitting: Hematology

## 2021-07-16 ENCOUNTER — Other Ambulatory Visit: Payer: Self-pay

## 2021-07-16 VITALS — BP 162/63 | HR 65 | Temp 97.7°F | Resp 16 | Ht <= 58 in | Wt 163.3 lb

## 2021-07-16 DIAGNOSIS — D649 Anemia, unspecified: Secondary | ICD-10-CM

## 2021-07-16 DIAGNOSIS — Z79899 Other long term (current) drug therapy: Secondary | ICD-10-CM | POA: Diagnosis not present

## 2021-07-16 DIAGNOSIS — D472 Monoclonal gammopathy: Secondary | ICD-10-CM

## 2021-07-16 DIAGNOSIS — M329 Systemic lupus erythematosus, unspecified: Secondary | ICD-10-CM | POA: Diagnosis not present

## 2021-07-16 DIAGNOSIS — E119 Type 2 diabetes mellitus without complications: Secondary | ICD-10-CM | POA: Diagnosis not present

## 2021-07-16 DIAGNOSIS — I10 Essential (primary) hypertension: Secondary | ICD-10-CM | POA: Diagnosis not present

## 2021-07-16 LAB — MULTIPLE MYELOMA PANEL, SERUM
Albumin SerPl Elph-Mcnc: 3.3 g/dL (ref 2.9–4.4)
Albumin/Glob SerPl: 1.3 (ref 0.7–1.7)
Alpha 1: 0.2 g/dL (ref 0.0–0.4)
Alpha2 Glob SerPl Elph-Mcnc: 0.8 g/dL (ref 0.4–1.0)
B-Globulin SerPl Elph-Mcnc: 0.8 g/dL (ref 0.7–1.3)
Gamma Glob SerPl Elph-Mcnc: 0.8 g/dL (ref 0.4–1.8)
Globulin, Total: 2.6 g/dL (ref 2.2–3.9)
IgA: 155 mg/dL (ref 64–422)
IgG (Immunoglobin G), Serum: 905 mg/dL (ref 586–1602)
IgM (Immunoglobulin M), Srm: 40 mg/dL (ref 26–217)
M Protein SerPl Elph-Mcnc: 0.2 g/dL — ABNORMAL HIGH
Total Protein ELP: 5.9 g/dL — ABNORMAL LOW (ref 6.0–8.5)

## 2021-07-17 DIAGNOSIS — M5136 Other intervertebral disc degeneration, lumbar region: Secondary | ICD-10-CM | POA: Diagnosis not present

## 2021-07-17 DIAGNOSIS — M9902 Segmental and somatic dysfunction of thoracic region: Secondary | ICD-10-CM | POA: Diagnosis not present

## 2021-07-17 DIAGNOSIS — M9903 Segmental and somatic dysfunction of lumbar region: Secondary | ICD-10-CM | POA: Diagnosis not present

## 2021-07-17 DIAGNOSIS — M5137 Other intervertebral disc degeneration, lumbosacral region: Secondary | ICD-10-CM | POA: Diagnosis not present

## 2021-07-17 DIAGNOSIS — M9905 Segmental and somatic dysfunction of pelvic region: Secondary | ICD-10-CM | POA: Diagnosis not present

## 2021-07-17 DIAGNOSIS — M47814 Spondylosis without myelopathy or radiculopathy, thoracic region: Secondary | ICD-10-CM | POA: Diagnosis not present

## 2021-07-22 ENCOUNTER — Encounter: Payer: Self-pay | Admitting: Hematology

## 2021-07-22 NOTE — Progress Notes (Addendum)
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 is here for followup for her MGUS and anemia. The patient's last visit with [place] was on 04/18/2021. T  The pt reports issues with recent diagnosed of atrial fibrillation.Started on Eliquis on Afib Had Covid 03/26/2021 and feels she has recovered from this well.  Has continued to take her Iron polysaccharide po daily  Lab results today 07/16/2021 show hemoglobin of 10.2 with normal WBC count of 4.7k and normal platelets of 168k CMP unremarkable Myeloma panel shows stable M spike of 0.2 g/dL Ferritin levels of 229 with an iron saturation of 27%  Patient notes no issues with abnormal bleeding.  No black stools no blood in the stools no hematuria.   REVIEW OF SYSTEMS:   10 Point review of Systems was done is negative except as noted above.  . Past Medical History:  Diagnosis Date   Anemia    Arthritis    Asthma    Atrial fibrillation ([place])    Diabetes mellitus without complication ([place])    Gout 12/17/2014   patient reported   Hyperlipidemia    Hypertension    Systemic lupus erythematosus ([place])    Vitiligo   possible SLE/rheumatoid arthritis following with Dr.Deveshwar.  . Past Surgical History:  Procedure Laterality Date   CATARACT EXTRACTION Bilateral 2015   DILATION AND CURETTAGE OF UTERUS     DOPPLER ECHOCARDIOGRAPHY  05/2018   Internist to review with  pt; potential heart murmur 06/20/18   keratosis removal  2021   SKIN SURGERY  11/30/2018   left side of face   TOOTH EXTRACTION      . Social History   Tobacco Use   Smoking status: Never   Smokeless tobacco: Never  Vaping Use   Vaping Use: Never used  Substance Use Topics   Alcohol use: Not Currently   Drug use: No    ALLERGIES:  is allergic to shellfish allergy and shellfish-derived products.  MEDICATIONS:  Current Outpatient Medications  Medication Sig Dispense Refill   Accu-Chek Softclix Lancets lancets Use to check blood sugars daily E11.69 100 each 2   albuterol (PROVENTIL HFA;VENTOLIN HFA) 108 (90 Base) MCG/ACT inhaler Inhale 2 puffs into the lungs every 6 (six) hours as needed for wheezing or shortness of breath. 1 Inhaler 2   Alcohol Swabs (ALCOHOL PADS) 70 % PADS Use as directed to check blood sugars 1 time per day dx: e11.22 150 each 2   allopurinol (ZYLOPRIM) 100 MG tablet TAKE 1 TABLET BY MOUTH EVERY DAY FOR GOUT 90 tablet 1   amLODipine (NORVASC) 2.5 MG tablet TAKE 1 TABLET(2.5 MG) BY MOUTH DAILY 30 tablet 11   apixaban (ELIQUIS) 5 MG TABS tablet Take 1 tablet (5 mg total) by mouth 2 (two) times daily. 180 tablet 3   Ascorbic Acid (VITAMIN C) 1000 MG tablet Take 1,000 mg by mouth daily.     atorvastatin (LIPITOR) 80 MG tablet TAKE 1 TABLET BY  MOUTH EVERY DAY 90 tablet 1   budesonide-formoterol (SYMBICORT) 160-4.5 MCG/ACT inhaler INHALE 2 PUFFS BY MOUTH TWICE DAILY IN THE MORNING AND EVENING (Patient taking differently: as needed. INHALE 2 PUFFS BY MOUTH TWICE DAILY IN THE MORNING AND EVENING) 10.2 g 2   Calcium Carb-Cholecalciferol 600-800 MG-UNIT TABS Take 1 tablet by mouth daily.      Cholecalciferol (VITAMIN D PO) Take 2,000 Units by mouth daily.     cyanocobalamin 1000 MCG tablet Take 1,000 mcg by mouth daily.     denosumab (PROLIA) 60 MG/ML SOSY injection Inject 60 mg into the skin every 6 (six) months. Appt on 06/07/21 1 mL 0   glucose blood (ACCU-CHEK GUIDE)  test strip Use to check blood sugars daily E11.69 100 each 2   hydroxychloroquine (PLAQUENIL) 200 MG tablet Take 1 tablet (200 mg total) by mouth daily. 90 tablet 0   Hypromellose (ARTIFICIAL TEARS OP) Apply 1 drop to eye daily as needed (dry eyes).     irbesartan (AVAPRO) 300 MG tablet TAKE 1 TABLET(300 MG) BY MOUTH DAILY 90 tablet 2   iron polysaccharides (NIFEREX) 150 MG capsule Take 150 mg by mouth daily. (Patient not taking: No sig reported)     Liniments (BEN GAY EX) Apply 1 application topically daily as needed (pain).     metoprolol succinate (TOPROL-XL) 25 MG 24 hr tablet Take 1 tablet (25 mg total) by mouth daily. 90 tablet 3   SYNTHROID 88 MCG tablet TAKE 1 TABLET BY MOUTH EVERY DAY MONDAY TO SATURDAY AND OFF ON SUNDAYS 90 tablet 0   trolamine salicylate (ASPERCREME) 10 % cream Apply 1 application topically daily as needed for muscle pain.     No current facility-administered medications for this visit.    PHYSICAL EXAMINATION: ECOG PERFORMANCE STATUS: 1 - Symptomatic but completely ambulatory  Vitals:   07/16/21 1325  BP: (!) 162/63  Pulse: 65  Resp: 16  Temp: 97.7 F (36.5 C)  SpO2: 100%    Filed Weights   07/16/21 1325  Weight: 163 lb 4.8 oz (74.1 kg)   .Body mass index is 35.09 kg/m.   NAD GENERAL:alert, in no acute distress and comfortable SKIN: no acute rashes, no significant lesions EYES: conjunctiva are pink and non-injected, sclera anicteric OROPHARYNX: MMM, no exudates, no oropharyngeal erythema or ulceration NECK: supple, no JVD LYMPH:  no palpable lymphadenopathy in the cervical, axillary or inguinal regions LUNGS: clear to auscultation b/l with normal respiratory effort HEART: regular rate & rhythm ABDOMEN:  normoactive bowel sounds , non tender, not distended. No palpable hepatosplenomegaly.  Extremity: no pedal edema PSYCH: alert & oriented x 3 with fluent speech NEURO: no focal motor/sensory deficits  LABORATORY DATA:   I have reviewed the  data as listed  . CBC Latest Ref Rng & Units 07/14/2021 05/23/2021 04/16/2021  WBC 4.0 - 10.5 K/uL 4.7 4.7 5.5  Hemoglobin 12.0 - 15.0 g/dL 10.2(L) 10.7(L) 9.7(L)  Hematocrit 36.0 - 46.0 % 31.6(L) 32.1(L) 29.5(L)  Platelets 150 - 400 K/uL 168 159 183   . CMP Latest Ref Rng & Units 07/14/2021 05/23/2021 04/16/2021  Glucose 70 - 99 mg/dL 99 70 88  BUN 8 - 23 mg/dL 23 16 19   Creatinine 0.44 - 1.00 mg/dL 1.13(H) 1.07(H) 1.10(H)  Sodium 135 - 145 mmol/L 143 141 139  Potassium 3.5 - 5.1 mmol/L 4.0 4.0 4.3  Chloride 98 - 111 mmol/L 107 105 106  CO2 22 - 32 mmol/L 26 31 27   Calcium 8.9 - 10.3 mg/dL 9.0  9.1 8.7(L)  Total Protein 6.5 - 8.1 g/dL 6.5 6.2 6.2(L)  Total Bilirubin 0.3 - 1.2 mg/dL 0.6 0.5 0.5  Alkaline Phos 38 - 126 U/L 54 - 43  AST 15 - 41 U/L 22 26 17   ALT 0 - 44 U/L 18 27 10    Lab Results  Component Value Date   IRON 61 07/14/2021   TIBC 225 (L) 07/14/2021   IRONPCTSAT 27 07/14/2021   (Iron and TIBC)  Lab Results  Component Value Date   FERRITIN 229 07/14/2021          RADIOGRAPHIC STUDIES: I have personally reviewed the radiological images as listed and agreed with the findings in the report.  METASTATIC BONE SURVEY 0/02/2015   COMPARISON:  None.   FINDINGS: Heart is normal size. Right diaphragmatic hernia again noted, unchanged. Lungs are clear. No effusions.   No focal lytic lesions within the visualized bony structures or acute bony abnormality. Degenerative changes within the shoulders, lower cervical spine, mid to lower thoracic spine, and lower lumbar spine. Advanced degenerative facet disease in the lower lumbar spine. 9 mm of anterolisthesis of L4 on L5. Endplate sclerosis within L4 and L5. Mild degenerative changes in the hips. Advanced degenerative changes within the knees. 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   IMPRESSION: No focal lytic lesion.   Degenerative joint disease involving multiple joints as  described above.   Grade 2 anterolisthesis of L4 on L5 related to facet disease.   ASSESSMENT & PLAN:   75 year old of an American female with   #1 IgG kappa monoclonal gammopathy of undetermined significance. Bone survey X-ray showed no lytic lesions. M spike is 0.6 g/dL in 10/016 and then 0.5g/dl in 10/2015. SPEP in May/2017 and 07/2016 showed M proteinwas stable at 0.3g/dl  MGUS likely related to underlying connective tissue disorder.  Bone marrow biopsy does show about 13% plasma cells however these appear to be polyclonal and likely related to a possible underlying inflammatory disorder. Less likely a biclonal plasma cell dyscrasia.  #2 Normocytic normochromic anemia likely related to chronic inflammation from recent diagnosis of lupus/rheumatoid arthritis + CKD.  Increased acanthocytes on peripheral blood smear -no overt evidence of liver disease. Could be a marker of some MDS Bone marrow biopsy did not show overt plasma cell dyscrasia or myelodysplastic syndrome. 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 Bone marrow biopsy showed decreased iron stores. Ferritin and iron have been trending down, she has not noticed any issues with bruising or bleeding recently. ?absorption issue. I have given her the option of IV iron which she has previously had without issue. She would like to have this.   PLAN: -Discussed pt labwork today,  Lab results today 07/16/2021 show hemoglobin of 10.2 with normal WBC count of 4.7k and normal platelets of 168k CMP unremarkable Myeloma panel shows stable M spike of 0.2 g/dL Ferritin levels of 229 with an iron saturation of 27% -No indication for IV iron at this time -Continue iron polysaccharide 150 mg p.o. daily -No indication of progression to multiple myeloma. -Recommend pt f/u with Dr. Estanislado Pandy for management of her autoimmune condition.  FOLLOW UP: RTC with Dr Irene Limbo with labs  in 12 months  . The total time spent in the appointment was 20 minutes and more than 50% was on counseling and direct patient cares.   All of the patient's questions were answered with apparent satisfaction.  The patient knows to call the clinic with any problems, questions or concerns.   Sullivan Lone MD [place] AAHIVMS [facility] Hematology/Oncology Physician [facility]

## 2021-07-23 ENCOUNTER — Telehealth: Payer: Self-pay

## 2021-07-23 DIAGNOSIS — M5459 Other low back pain: Secondary | ICD-10-CM | POA: Diagnosis not present

## 2021-07-23 NOTE — Chronic Care Management (AMB) (Signed)
    Chronic Care Management Pharmacy Assistant   Name: Hannah [name]  MRN: [id number] DOB: 06/12/1946  Reason for Encounter: Patient assistance     Medications: Outpatient Encounter Medications as of 07/23/2021  Medication Sig   Accu-Chek Softclix Lancets lancets Use to check blood sugars daily E11.69   albuterol (PROVENTIL HFA;VENTOLIN HFA) 108 (90 Base) MCG/ACT inhaler Inhale 2 puffs into the lungs every 6 (six) hours as needed for wheezing or shortness of breath.   Alcohol Swabs (ALCOHOL PADS) 70 % PADS Use as directed to check blood sugars 1 time per day dx: e11.22   allopurinol (ZYLOPRIM) 100 MG tablet TAKE 1 TABLET BY MOUTH EVERY DAY FOR GOUT   amLODipine (NORVASC) 2.5 MG tablet TAKE 1 TABLET(2.5 MG) BY MOUTH DAILY   apixaban (ELIQUIS) 5 MG TABS tablet Take 1 tablet (5 mg total) by mouth 2 (two) times daily.   Ascorbic Acid (VITAMIN C) 1000 MG tablet Take 1,000 mg by mouth daily.   atorvastatin (LIPITOR) 80 MG tablet TAKE 1 TABLET BY MOUTH EVERY DAY   budesonide-formoterol (SYMBICORT) 160-4.5 MCG/ACT inhaler INHALE 2 PUFFS BY MOUTH TWICE DAILY IN THE MORNING AND EVENING (Patient taking differently: as needed. INHALE 2 PUFFS BY MOUTH TWICE DAILY IN THE MORNING AND EVENING)   Calcium Carb-Cholecalciferol 600-800 MG-UNIT TABS Take 1 tablet by mouth daily.    Cholecalciferol (VITAMIN D PO) Take 2,000 Units by mouth daily.   cyanocobalamin 1000 MCG tablet Take 1,000 mcg by mouth daily.   denosumab (PROLIA) 60 MG/ML SOSY injection Inject 60 mg into the skin every 6 (six) months. Appt on 06/07/21   glucose blood (ACCU-CHEK GUIDE) test strip Use to check blood sugars daily E11.69   hydroxychloroquine (PLAQUENIL) 200 MG tablet Take 1 tablet (200 mg total) by mouth daily.   Hypromellose (ARTIFICIAL TEARS OP) Apply 1 drop to eye daily as needed (dry eyes).   irbesartan (AVAPRO) 300 MG tablet TAKE 1 TABLET(300 MG) BY MOUTH DAILY   iron polysaccharides (NIFEREX) 150 MG capsule Take 150 mg  by mouth daily. (Patient not taking: No sig reported)   Liniments (BEN GAY EX) Apply 1 application topically daily as needed (pain).   metoprolol succinate (TOPROL-XL) 25 MG 24 hr tablet Take 1 tablet (25 mg total) by mouth daily.   SYNTHROID 88 MCG tablet TAKE 1 TABLET BY MOUTH EVERY DAY MONDAY TO SATURDAY AND OFF ON SUNDAYS   trolamine salicylate (ASPERCREME) 10 % cream Apply 1 application topically daily as needed for muscle pain.   No facility-administered encounter medications on file as of 07/23/2021.   07-23-2021: Patient states she received a renewal application from [facility] assist for her synthroid and wanted to know if she could fill out her portion with income and drop it off to [facility] next week. Informed patient that it would be a good idea since we will be starting patient assistance renewals for 2023 next month.  [place] Pharmacist Assistant [phone]

## 2021-07-24 DIAGNOSIS — M9902 Segmental and somatic dysfunction of thoracic region: Secondary | ICD-10-CM | POA: Diagnosis not present

## 2021-07-24 DIAGNOSIS — M5137 Other intervertebral disc degeneration, lumbosacral region: Secondary | ICD-10-CM | POA: Diagnosis not present

## 2021-07-24 DIAGNOSIS — M47814 Spondylosis without myelopathy or radiculopathy, thoracic region: Secondary | ICD-10-CM | POA: Diagnosis not present

## 2021-07-24 DIAGNOSIS — M5136 Other intervertebral disc degeneration, lumbar region: Secondary | ICD-10-CM | POA: Diagnosis not present

## 2021-07-24 DIAGNOSIS — M9903 Segmental and somatic dysfunction of lumbar region: Secondary | ICD-10-CM | POA: Diagnosis not present

## 2021-07-24 DIAGNOSIS — M9905 Segmental and somatic dysfunction of pelvic region: Secondary | ICD-10-CM | POA: Diagnosis not present

## 2021-07-29 DIAGNOSIS — M179 Osteoarthritis of knee, unspecified: Secondary | ICD-10-CM | POA: Diagnosis not present

## 2021-07-29 DIAGNOSIS — M5416 Radiculopathy, lumbar region: Secondary | ICD-10-CM | POA: Diagnosis not present

## 2021-07-31 ENCOUNTER — Telehealth: Payer: Self-pay

## 2021-07-31 DIAGNOSIS — M5137 Other intervertebral disc degeneration, lumbosacral region: Secondary | ICD-10-CM | POA: Diagnosis not present

## 2021-07-31 DIAGNOSIS — M9903 Segmental and somatic dysfunction of lumbar region: Secondary | ICD-10-CM | POA: Diagnosis not present

## 2021-07-31 DIAGNOSIS — M9905 Segmental and somatic dysfunction of pelvic region: Secondary | ICD-10-CM | POA: Diagnosis not present

## 2021-07-31 DIAGNOSIS — M47814 Spondylosis without myelopathy or radiculopathy, thoracic region: Secondary | ICD-10-CM | POA: Diagnosis not present

## 2021-07-31 DIAGNOSIS — M5136 Other intervertebral disc degeneration, lumbar region: Secondary | ICD-10-CM | POA: Diagnosis not present

## 2021-07-31 DIAGNOSIS — M9902 Segmental and somatic dysfunction of thoracic region: Secondary | ICD-10-CM | POA: Diagnosis not present

## 2021-07-31 NOTE — Chronic Care Management (AMB) (Signed)
Chronic Care Management Pharmacy Assistant   Name: Hannah [name]  MRN: [id number] DOB: [birth year]   Reason for Encounter: Disease State/ Hypertension  Recent office visits:  None  Recent consult visits:  07-16-2021 Brunetta Genera, MD (Oncology).  Hospital visits:  None in previous 6 months  Medications: Outpatient Encounter Medications as of 07/31/2021  Medication Sig   Accu-Chek Softclix Lancets lancets Use to check blood sugars daily E11.69   albuterol (PROVENTIL HFA;VENTOLIN HFA) 108 (90 Base) MCG/ACT inhaler Inhale 2 puffs into the lungs every 6 (six) hours as needed for wheezing or shortness of breath.   Alcohol Swabs (ALCOHOL PADS) 70 % PADS Use as directed to check blood sugars 1 time per day dx: e11.22   allopurinol (ZYLOPRIM) 100 MG tablet TAKE 1 TABLET BY MOUTH EVERY DAY FOR GOUT   amLODipine (NORVASC) 2.5 MG tablet TAKE 1 TABLET(2.5 MG) BY MOUTH DAILY   apixaban (ELIQUIS) 5 MG TABS tablet Take 1 tablet (5 mg total) by mouth 2 (two) times daily.   Ascorbic Acid (VITAMIN C) 1000 MG tablet Take 1,000 mg by mouth daily.   atorvastatin (LIPITOR) 80 MG tablet TAKE 1 TABLET BY MOUTH EVERY DAY   budesonide-formoterol (SYMBICORT) 160-4.5 MCG/ACT inhaler INHALE 2 PUFFS BY MOUTH TWICE DAILY IN THE MORNING AND EVENING (Patient taking differently: as needed. INHALE 2 PUFFS BY MOUTH TWICE DAILY IN THE MORNING AND EVENING)   Calcium Carb-Cholecalciferol 600-800 MG-UNIT TABS Take 1 tablet by mouth daily.    Cholecalciferol (VITAMIN D PO) Take 2,000 Units by mouth daily.   cyanocobalamin 1000 MCG tablet Take 1,000 mcg by mouth daily.   denosumab (PROLIA) 60 MG/ML SOSY injection Inject 60 mg into the skin every 6 (six) months. Appt on 06/07/21   glucose blood (ACCU-CHEK GUIDE) test strip Use to check blood sugars daily E11.69   hydroxychloroquine (PLAQUENIL) 200 MG tablet Take 1 tablet (200 mg total) by mouth daily.   Hypromellose (ARTIFICIAL TEARS OP) Apply 1 drop to eye  daily as needed (dry eyes).   irbesartan (AVAPRO) 300 MG tablet TAKE 1 TABLET(300 MG) BY MOUTH DAILY   iron polysaccharides (NIFEREX) 150 MG capsule Take 150 mg by mouth daily. (Patient not taking: No sig reported)   Liniments (BEN GAY EX) Apply 1 application topically daily as needed (pain).   metoprolol succinate (TOPROL-XL) 25 MG 24 hr tablet Take 1 tablet (25 mg total) by mouth daily.   SYNTHROID 88 MCG tablet TAKE 1 TABLET BY MOUTH EVERY DAY MONDAY TO SATURDAY AND OFF ON SUNDAYS   trolamine salicylate (ASPERCREME) 10 % cream Apply 1 application topically daily as needed for muscle pain.   No facility-administered encounter medications on file as of 07/31/2021.  Reviewed chart prior to disease state call. Spoke with patient regarding BP  Recent Office Vitals: BP Readings from Last 3 Encounters:  07/16/21 (!) 162/63  06/25/21 138/70  06/25/21 130/70   Pulse Readings from Last 3 Encounters:  07/16/21 65  06/25/21 67  06/25/21 67    Wt Readings from Last 3 Encounters:  07/16/21 163 lb 4.8 oz (74.1 kg)  06/25/21 161 lb (73 kg)  06/25/21 161 lb 12.8 oz (73.4 kg)     Kidney Function Lab Results  Component Value Date/Time   CREATININE 1.13 (H) 07/14/2021 08:12 AM   CREATININE 1.07 (H) 05/23/2021 01:33 PM   CREATININE 1.10 (H) 04/16/2021 07:29 AM   CREATININE 1.37 (H) 12/19/2020 02:26 PM   CREATININE 1.4 (H) 08/12/2017 08:08 AM  CREATININE 1.2 (H) 02/16/2017 08:29 AM   GFRNONAA 51 (L) 07/14/2021 08:12 AM   GFRNONAA 38 (L) 12/19/2020 02:26 PM   GFRAA 44 (L) 12/19/2020 02:26 PM    BMP Latest Ref Rng & Units 07/14/2021 05/23/2021 04/16/2021  Glucose 70 - 99 mg/dL 99 70 88  BUN 8 - 23 mg/dL 23 16 19   Creatinine 0.44 - 1.00 mg/dL 1.13(H) 1.07(H) 1.10(H)  BUN/Creat Ratio 6 - 22 (calc) - 15 -  Sodium 135 - 145 mmol/L 143 141 139  Potassium 3.5 - 5.1 mmol/L 4.0 4.0 4.3  Chloride 98 - 111 mmol/L 107 105 106  CO2 22 - 32 mmol/L 26 31 27   Calcium 8.9 - 10.3 mg/dL 9.0 9.1 8.7(L)     Current antihypertensive regimen:  Amlodipine 2.5 mg daily Irbesartan 300 mg daily Metoprolol 25 mg daily  How often are you checking your Blood Pressure? Patient states every other day  Current home BP readings: 140/83  What recent interventions/DTPs have been made by any provider to improve Blood Pressure control since last CPP Visit:  Educated on Importance of home blood pressure monitoring; Proper BP monitoring technique; Counseled to monitor BP at home everyday, document, and provide log at future appointments Recommended to continue current medication  Any recent hospitalizations or ED visits since last visit with CPP? No  What diet changes have been made to improve Blood Pressure Control?  Patient states she's been eating vegetables/ fruit and drinking water.  What exercise is being done to improve your Blood Pressure Control?  Patient states she goes to the [facility] 3 days out the week and yoga twice weekly  Adherence Review: Is the patient currently on ACE/ARB medication? Yes Does the patient have >5 day gap between last estimated fill dates? No  NOTES: Patient states a PA from [facility] suggested lowering atorvastatin. Sent message to Dr. Baird Cancer.  Care Gaps: Covid booster overdue last completed 01-01-2021  AWV 07-29-2022  Star Rating Drugs: Atorvastatin 80 mg- Last filled 04-09-2021 90 DS [facility] (Patient states she has plenty on hand) Irbesartan 300 mg- Last filled 04-06-2021 90 DS [facility] (Patient states medications was filled on 07-06-2021 for 90 DS)  [place] Clinical Pharmacist Assistant [phone]

## 2021-08-01 ENCOUNTER — Telehealth: Payer: Self-pay

## 2021-08-01 NOTE — Chronic Care Management (AMB) (Signed)
    Chronic Care Management Pharmacy Assistant   Name: Hannah [name]  MRN: [id number] DOB: [birth year]  Reason for Encounter: Patient Assistance Coordination  08/01/2021-  Patient assistance application for Synthroid with [employer] was dropped off by patient to PCP office filled out and signed, patient also attached OOP pharmacy expense report and income. Filled out prescriber page, awaiting Dr. Baird Cancer signature to fax. Called patient to inform, no answer left message that application for 1884 enrollment will be faxed to [facility] assist after Dr Baird Cancer signs.     Medications: Outpatient Encounter Medications as of 08/01/2021  Medication Sig   Accu-Chek Softclix Lancets lancets Use to check blood sugars daily E11.69   albuterol (PROVENTIL HFA;VENTOLIN HFA) 108 (90 Base) MCG/ACT inhaler Inhale 2 puffs into the lungs every 6 (six) hours as needed for wheezing or shortness of breath.   Alcohol Swabs (ALCOHOL PADS) 70 % PADS Use as directed to check blood sugars 1 time per day dx: e11.22   allopurinol (ZYLOPRIM) 100 MG tablet TAKE 1 TABLET BY MOUTH EVERY DAY FOR GOUT   amLODipine (NORVASC) 2.5 MG tablet TAKE 1 TABLET(2.5 MG) BY MOUTH DAILY   apixaban (ELIQUIS) 5 MG TABS tablet Take 1 tablet (5 mg total) by mouth 2 (two) times daily.   Ascorbic Acid (VITAMIN C) 1000 MG tablet Take 1,000 mg by mouth daily.   atorvastatin (LIPITOR) 80 MG tablet TAKE 1 TABLET BY MOUTH EVERY DAY   budesonide-formoterol (SYMBICORT) 160-4.5 MCG/ACT inhaler INHALE 2 PUFFS BY MOUTH TWICE DAILY IN THE MORNING AND EVENING (Patient taking differently: as needed. INHALE 2 PUFFS BY MOUTH TWICE DAILY IN THE MORNING AND EVENING)   Calcium Carb-Cholecalciferol 600-800 MG-UNIT TABS Take 1 tablet by mouth daily.    Cholecalciferol (VITAMIN D PO) Take 2,000 Units by mouth daily.   cyanocobalamin 1000 MCG tablet Take 1,000 mcg by mouth daily.   denosumab (PROLIA) 60 MG/ML SOSY injection Inject 60 mg into the skin every 6  (six) months. Appt on 06/07/21   glucose blood (ACCU-CHEK GUIDE) test strip Use to check blood sugars daily E11.69   hydroxychloroquine (PLAQUENIL) 200 MG tablet Take 1 tablet (200 mg total) by mouth daily.   Hypromellose (ARTIFICIAL TEARS OP) Apply 1 drop to eye daily as needed (dry eyes).   irbesartan (AVAPRO) 300 MG tablet TAKE 1 TABLET(300 MG) BY MOUTH DAILY   iron polysaccharides (NIFEREX) 150 MG capsule Take 150 mg by mouth daily. (Patient not taking: No sig reported)   Liniments (BEN GAY EX) Apply 1 application topically daily as needed (pain).   metoprolol succinate (TOPROL-XL) 25 MG 24 hr tablet Take 1 tablet (25 mg total) by mouth daily.   SYNTHROID 88 MCG tablet TAKE 1 TABLET BY MOUTH EVERY DAY MONDAY TO SATURDAY AND OFF ON SUNDAYS   trolamine salicylate (ASPERCREME) 10 % cream Apply 1 application topically daily as needed for muscle pain.   No facility-administered encounter medications on file as of 08/01/2021.   Pattricia Boss, [place] Pharmacist Assistant [phone]

## 2021-08-07 ENCOUNTER — Telehealth: Payer: Self-pay

## 2021-08-07 NOTE — Telephone Encounter (Signed)
Application has been printed, signed and given to medical records for fax and scan.

## 2021-08-07 NOTE — Telephone Encounter (Signed)
**Note De-Identified Ailyne Pawley Obfuscation** The pt left her completed BMSPAF application for Eliquis at the office with documents.  I have completed the providers page of her application and have e-mailed all to the nurse working with Dr Acie Fredrickson tomorrow so she can obtain his signature, date it, and to fax all to [facility] at the fax number written on the cover letter included or to place in the to be faxed basket in Medical Records to be faxed.

## 2021-08-18 DIAGNOSIS — M5136 Other intervertebral disc degeneration, lumbar region: Secondary | ICD-10-CM | POA: Diagnosis not present

## 2021-08-18 DIAGNOSIS — M5137 Other intervertebral disc degeneration, lumbosacral region: Secondary | ICD-10-CM | POA: Diagnosis not present

## 2021-08-18 DIAGNOSIS — M9902 Segmental and somatic dysfunction of thoracic region: Secondary | ICD-10-CM | POA: Diagnosis not present

## 2021-08-18 DIAGNOSIS — M9903 Segmental and somatic dysfunction of lumbar region: Secondary | ICD-10-CM | POA: Diagnosis not present

## 2021-08-18 DIAGNOSIS — M9905 Segmental and somatic dysfunction of pelvic region: Secondary | ICD-10-CM | POA: Diagnosis not present

## 2021-08-18 DIAGNOSIS — M47814 Spondylosis without myelopathy or radiculopathy, thoracic region: Secondary | ICD-10-CM | POA: Diagnosis not present

## 2021-08-19 ENCOUNTER — Telehealth: Payer: Self-pay | Admitting: *Deleted

## 2021-08-19 ENCOUNTER — Other Ambulatory Visit: Payer: Self-pay | Admitting: Internal Medicine

## 2021-08-19 NOTE — Telephone Encounter (Signed)
   [facility] Pre-operative Risk Assessment    Patient Name: Hannah [name]  DOB: August 17, 1946 MRN: [id number]  Request for surgical clearance:   What type of surgery is being performed? LUMBAR ESI    When is this surgery scheduled? 08/25/2021   What type of clearance is required (medical clearance vs. Pharmacy clearance to hold med vs. Both)? BOTH    Are there any medications that need to be held prior to surgery and how long? ELIQUIS FOR 3 DAYS    Practice name and name of physician performing surgery? [facility]    What is the office phone number? [phone]   1.   What is the office fax number? [phone]   8.   Anesthesia type (None, local, MAC, general) ?

## 2021-08-19 NOTE — Telephone Encounter (Signed)
Pharmacy, can you please comment on how long Eliquis can be held for upcoming procedure?  Thank you! 

## 2021-08-20 ENCOUNTER — Other Ambulatory Visit: Payer: Self-pay

## 2021-08-20 DIAGNOSIS — E1122 Type 2 diabetes mellitus with diabetic chronic kidney disease: Secondary | ICD-10-CM

## 2021-08-20 DIAGNOSIS — J4520 Mild intermittent asthma, uncomplicated: Secondary | ICD-10-CM

## 2021-08-20 DIAGNOSIS — I129 Hypertensive chronic kidney disease with stage 1 through stage 4 chronic kidney disease, or unspecified chronic kidney disease: Secondary | ICD-10-CM

## 2021-08-20 DIAGNOSIS — N1831 Chronic kidney disease, stage 3a: Secondary | ICD-10-CM

## 2021-08-20 MED ORDER — MOMETASONE FURO-FORMOTEROL FUM 200-5 MCG/ACT IN AERO: 2.0000 | INHALATION_SPRAY | Freq: Two times a day (BID) | RESPIRATORY_TRACT | 30 refills | Status: DC

## 2021-08-20 NOTE — Telephone Encounter (Signed)
Patient with diagnosis of atrial fibrillation on Eliquis for anticoagulation.    Procedure: lumbar ESI Date of procedure: 08/25/21   CHA2DS2-VASc Score = 5   This indicates a 7.2% annual risk of stroke. The patient's score is based upon: CHF History: 0 HTN History: 1 Diabetes History: 1 Stroke History: 0 Vascular Disease History: 0 Age Score: 2 Gender Score: 1  CrCl 38 Platelet count 168   Per office protocol, patient can hold Eliquis for 3 days prior to procedure.   Patient will not need bridging with Lovenox (enoxaparin) around procedure.

## 2021-08-20 NOTE — Telephone Encounter (Signed)
Of note, patient also will likely be having knee surgery in the next couple of months. She was felt to be at acceptable risk for this at last visit with Dr. Acie Fredrickson in 03/2021. Based on my conversation with her today, she is still at acceptable risk for this procedure. Will wait for formal clearance form to come in for this.  Darreld Mclean, PA-C 08/20/2021 8:09 AM

## 2021-08-20 NOTE — Telephone Encounter (Signed)
Patient states she is returning a call regarding her clearance. However, she states she already went over the medication instructions. She states an additional call is not necessary unless we have additional instruction for her. I was unable to contact covering staff for advisement.

## 2021-08-20 NOTE — Telephone Encounter (Signed)
I spoke with patient yesterday and went over all pre-op recommendations including medication instructions. I think she may have received an old message. No new instructions.  Darreld Mclean, PA-C 08/20/2021 12:56 PM

## 2021-08-20 NOTE — Telephone Encounter (Signed)
    Patient Name: Hannah [name]  DOB: 12-Jun-1946 MRN: [id number]  Primary Cardiologist: Mertie Moores, MD  Chart reviewed as part of pre-operative protocol coverage. Patient was last seen by Dr. Acie Fredrickson in 03/2021 at which time she was doing well. She was contacted today for further pre-op evaluation and was doing well. She has asthma and has noted a little audible wheezing at night lately but denies any shortness of breath. No chest pain, orthopnea, palpitations, syncope. She exercises 3 times per week and denies any chest pain or shortness of breath with this. Given past medical history and time since last visit, based on ACC/AHA guidelines, Hannah [name] would be at acceptable risk for the planned procedure without further cardiovascular testing.   Per Pharmacy and office protocol, "Patient can hold Eliquis for 3 days prior to procedure.  Patient will not need bridging with Lovenox (enoxaparin) around procedure." Please restart Eliquis as soon as possible following spinal injection.   I will route this recommendation to the requesting party via Epic fax function and remove from pre-op pool.  Please call with questions.  Darreld Mclean, PA-C 08/20/2021, 8:02 AM

## 2021-08-24 DIAGNOSIS — N952 Postmenopausal atrophic vaginitis: Secondary | ICD-10-CM | POA: Insufficient documentation

## 2021-08-25 ENCOUNTER — Other Ambulatory Visit: Payer: Self-pay

## 2021-08-25 DIAGNOSIS — M5416 Radiculopathy, lumbar region: Secondary | ICD-10-CM | POA: Diagnosis not present

## 2021-08-25 MED ORDER — BREO ELLIPTA 200-25 MCG/ACT IN AEPB: 1.0000 | INHALATION_SPRAY | Freq: Every day | RESPIRATORY_TRACT | 3 refills | Status: DC

## 2021-08-27 ENCOUNTER — Telehealth: Payer: Self-pay

## 2021-08-27 NOTE — Chronic Care Management (AMB) (Signed)
    Chronic Care Management Pharmacy Assistant   Name: Hannah [name]  MRN: [id number] DOB: 1946/08/24  Reason for Encounter: 2023 PAP    Medications: Outpatient Encounter Medications as of 08/27/2021  Medication Sig   Accu-Chek Softclix Lancets lancets Use to check blood sugars daily E11.69   albuterol (PROVENTIL HFA;VENTOLIN HFA) 108 (90 Base) MCG/ACT inhaler Inhale 2 puffs into the lungs every 6 (six) hours as needed for wheezing or shortness of breath.   Alcohol Swabs (ALCOHOL PADS) 70 % PADS Use as directed to check blood sugars 1 time per day dx: e11.22   allopurinol (ZYLOPRIM) 100 MG tablet TAKE 1 TABLET BY MOUTH EVERY DAY FOR GOUT   amLODipine (NORVASC) 2.5 MG tablet TAKE 1 TABLET(2.5 MG) BY MOUTH DAILY   apixaban (ELIQUIS) 5 MG TABS tablet Take 1 tablet (5 mg total) by mouth 2 (two) times daily.   Ascorbic Acid (VITAMIN C) 1000 MG tablet Take 1,000 mg by mouth daily.   atorvastatin (LIPITOR) 80 MG tablet TAKE 1 TABLET BY MOUTH EVERY DAY   BREO ELLIPTA 200-25 MCG/ACT AEPB Inhale 1 puff into the lungs daily.   budesonide-formoterol (SYMBICORT) 160-4.5 MCG/ACT inhaler INHALE 2 PUFFS BY MOUTH TWICE DAILY IN THE MORNING AND IN THE EVENING   Calcium Carb-Cholecalciferol 600-800 MG-UNIT TABS Take 1 tablet by mouth daily.    Cholecalciferol (VITAMIN D PO) Take 2,000 Units by mouth daily.   cyanocobalamin 1000 MCG tablet Take 1,000 mcg by mouth daily.   denosumab (PROLIA) 60 MG/ML SOSY injection Inject 60 mg into the skin every 6 (six) months. Appt on 06/07/21   glucose blood (ACCU-CHEK GUIDE) test strip Use to check blood sugars daily E11.69   hydroxychloroquine (PLAQUENIL) 200 MG tablet Take 1 tablet (200 mg total) by mouth daily.   Hypromellose (ARTIFICIAL TEARS OP) Apply 1 drop to eye daily as needed (dry eyes).   irbesartan (AVAPRO) 300 MG tablet TAKE 1 TABLET(300 MG) BY MOUTH DAILY   iron polysaccharides (NIFEREX) 150 MG capsule Take 150 mg by mouth daily. (Patient not taking:  No sig reported)   Liniments (BEN GAY EX) Apply 1 application topically daily as needed (pain).   metoprolol succinate (TOPROL-XL) 25 MG 24 hr tablet Take 1 tablet (25 mg total) by mouth daily.   mometasone-formoterol (DULERA) 200-5 MCG/ACT AERO Inhale 2 puffs into the lungs 2 (two) times daily.   SYNTHROID 88 MCG tablet TAKE 1 TABLET BY MOUTH EVERY DAY MONDAY TO SATURDAY AND OFF ON SUNDAYS   trolamine salicylate (ASPERCREME) 10 % cream Apply 1 application topically daily as needed for muscle pain.   No facility-administered encounter medications on file as of 08/27/2021.   08-27-2021: Initiated new patient assistance application for Symbicort. Application will be mailed out to patient.  [place] Pharmacist Assistant [phone]

## 2021-09-03 ENCOUNTER — Telehealth: Payer: Self-pay

## 2021-09-03 DIAGNOSIS — M5137 Other intervertebral disc degeneration, lumbosacral region: Secondary | ICD-10-CM | POA: Diagnosis not present

## 2021-09-03 DIAGNOSIS — M5136 Other intervertebral disc degeneration, lumbar region: Secondary | ICD-10-CM | POA: Diagnosis not present

## 2021-09-03 DIAGNOSIS — M9902 Segmental and somatic dysfunction of thoracic region: Secondary | ICD-10-CM | POA: Diagnosis not present

## 2021-09-03 DIAGNOSIS — M47814 Spondylosis without myelopathy or radiculopathy, thoracic region: Secondary | ICD-10-CM | POA: Diagnosis not present

## 2021-09-03 DIAGNOSIS — M9903 Segmental and somatic dysfunction of lumbar region: Secondary | ICD-10-CM | POA: Diagnosis not present

## 2021-09-03 DIAGNOSIS — M9905 Segmental and somatic dysfunction of pelvic region: Secondary | ICD-10-CM | POA: Diagnosis not present

## 2021-09-03 NOTE — Chronic Care Management (AMB) (Signed)
  Hannah [name] was reminded to have all medications, supplements and any blood glucose and blood pressure readings available for review with [name], Pharm. D, at her telephone visit on 09-03-2021 at 10:00.   Questions: Have you had any recent office visit or specialist visit outside of [facility]? Patient stated no  Are there any concerns you would like to discuss during your office visit? Patient stated no  Are you having any problems obtaining your medications? (Whether it pharmacy issues or cost) Patient stated no  If patient has any PAP medications ask if they are having any problems getting their PAP medication or refill? Patient stated no  Care Gaps: Covid booster overdue last completed 01-01-2021  AWV 07-29-2022  Star Rating Drug: Atorvastatin 80 mg- Last filled 07-07-2021 90 DS [facility] Irbesartan 300 mg- Last filled 07-02-2021 90 DS [facility]   Any gaps in medications fill history? No  [place] Pharmacist Assistant [phone]

## 2021-09-04 ENCOUNTER — Ambulatory Visit: Payer: Medicare HMO

## 2021-09-04 DIAGNOSIS — J4520 Mild intermittent asthma, uncomplicated: Secondary | ICD-10-CM

## 2021-09-04 DIAGNOSIS — I129 Hypertensive chronic kidney disease with stage 1 through stage 4 chronic kidney disease, or unspecified chronic kidney disease: Secondary | ICD-10-CM

## 2021-09-04 NOTE — Progress Notes (Signed)
Chronic Care Management Pharmacy Note  09/09/2021 Name:  Hannah [name] MRN:  [id number] DOB:  Apr 02, 1916  Summary: Patient reports that she has been taking her medication everyday and checking her BP at home and logging this information.   Recommendations/Changes made from today's visit: Recommend patient take her maintenance asthma medication everyday. She reports a cost barrier for her symbicort.   Plan: Patient is going to take her medication everyday. Patient to complete patient assistance application for Symbicort inhaler.    Subjective: [name] is an 75 y.o. year old female who is a primary patient of Glendale Chard, MD.  The CCM team was consulted for assistance with disease management and care coordination needs.  Patient reports that she has been wheezing a bit a night and she had some coughing. She reports that it has not happened to the point where her breath is labored. She is hearing the wheezing at night. She is seeing Dr. Nelva Bush at [facility] for her back pain. She is in the process of talking to her doctor about her knee she is going to see if she can schedule her knee replacement. She had a wonderful birthday and they went to [place] for [employer] and she had a great time.   Engaged with patient by telephone for follow up visit in response to provider referral for pharmacy case management and/or care coordination services.   Consent to Services:  The patient was given information about Chronic Care Management services, agreed to services, and gave verbal consent prior to initiation of services.  Please see initial visit note for detailed documentation.   Patient Care Team: Glendale Chard, MD as PCP - General (Internal Medicine) Nahser, Wonda Cheng, MD as PCP - Cardiology (Cardiology) Rex Kras Claudette Stapler, RN as [place], [facility] (Inactive) (Pharmacist)  Recent office visits: 06/25/2021 PCP OV 05/22/2021  PCP OV 04/08/2021 PCP OV   Recent consult visits: 07/16/2021 Oncology OV 06/13/2021 Rheumatology OV 04/16/2021 Cardiology [facility] visits: 03/26/2021    Objective:  Lab Results  Component Value Date   CREATININE 1.13 (H) 07/14/2021   BUN 23 07/14/2021   GFRNONAA 51 (L) 07/14/2021   GFRAA 44 (L) 12/19/2020   NA 143 07/14/2021   K 4.0 07/14/2021   CALCIUM 9.0 07/14/2021   CO2 26 07/14/2021   GLUCOSE 99 07/14/2021    Lab Results  Component Value Date/Time   HGBA1C 5.4 06/25/2021 12:17 PM   HGBA1C 5.6 12/30/2020 12:54 PM   MICROALBUR 150 12/30/2020 01:02 PM   MICROALBUR 80 10/31/2019 12:13 PM    Last diabetic Eye exam:  Lab Results  Component Value Date/Time   HMDIABEYEEXA No Retinopathy 09/12/2020 12:00 AM    Last diabetic Foot exam: No results found for: HMDIABFOOTEX   Lab Results  Component Value Date   CHOL 153 12/30/2020   HDL 75 12/30/2020   LDLCALC 67 12/30/2020   TRIG 50 12/30/2020   CHOLHDL 2.0 12/30/2020    Hepatic Function Latest Ref Rng & Units 07/14/2021 05/23/2021 04/16/2021  Total Protein 6.5 - 8.1 g/dL 6.5 6.2 6.2(L)  Albumin 3.5 - 5.0 g/dL 3.7 - 3.3(L)  AST 15 - 41 U/L 22 26 17   ALT 0 - 44 U/L 18 27 10   Alk Phosphatase 38 - 126 U/L 54 - 43  Total Bilirubin 0.3 - 1.2 mg/dL 0.6 0.5 0.5    Lab Results  Component Value Date/Time   TSH 1.020 06/25/2021 12:17 PM  TSH 0.690 03/26/2021 02:36 PM   TSH 0.666 12/30/2020 12:54 PM   FREET4 1.43 06/25/2021 12:17 PM   FREET4 1.44 12/30/2020 12:54 PM    CBC Latest Ref Rng & Units 07/14/2021 05/23/2021 04/16/2021  WBC 4.0 - 10.5 K/uL 4.7 4.7 5.5  Hemoglobin 12.0 - 15.0 g/dL 10.2(L) 10.7(L) 9.7(L)  Hematocrit 36.0 - 46.0 % 31.6(L) 32.1(L) 29.5(L)  Platelets 150 - 400 K/uL 168 159 183    Lab Results  Component Value Date/Time   VD25OH 41 05/23/2021 01:33 PM   VD25OH 50 12/02/2020 01:50 PM    Clinical ASCVD: Yes  The 10-year ASCVD risk score (Arnett DK, et al., 2019) is: 39.3%   Values  used to calculate the score:     Age: 75 years     Sex: Female     Is Non-Hispanic African American: Yes     Diabetic: Yes     Tobacco smoker: No     Systolic Blood Pressure: 412 mmHg     Is BP treated: Yes     HDL Cholesterol: 75 mg/dL     Total Cholesterol: 153 mg/dL    Depression screen [facility] 2/9 06/25/2021 06/19/2020 06/19/2020  Decreased Interest 0 0 0  Down, Depressed, Hopeless 0 0 0  PHQ - 2 Score 0 0 0  Altered sleeping - - -  Tired, decreased energy - - -  Change in appetite - - -  Feeling bad or failure about yourself  - - -  Trouble concentrating - - -  Moving slowly or fidgety/restless - - -  Suicidal thoughts - - -  PHQ-9 Score - - -  Difficult doing work/chores - - -  Some recent data might be hidden      Social History   Tobacco Use  Smoking Status Never  Smokeless Tobacco Never   BP Readings from Last 3 Encounters:  07/16/21 (!) 162/63  06/25/21 138/70  06/25/21 130/70   Pulse Readings from Last 3 Encounters:  07/16/21 65  06/25/21 67  06/25/21 67   Wt Readings from Last 3 Encounters:  07/16/21 163 lb 4.8 oz (74.1 kg)  06/25/21 161 lb (73 kg)  06/25/21 161 lb 12.8 oz (73.4 kg)   BMI Readings from Last 3 Encounters:  07/16/21 35.09 kg/m  06/25/21 34.60 kg/m  06/25/21 34.77 kg/m    Assessment/Interventions: Review of patient past medical history, allergies, medications, health status, including review of consultants reports, laboratory and other test data, was performed as part of comprehensive evaluation and provision of chronic care management services.   SDOH:  (Social Determinants of Health) assessments and interventions performed: No  SDOH Screenings   Alcohol Screen: Low Risk    Last Alcohol Screening Score (AUDIT): 1  Depression (PHQ2-9): Low Risk    PHQ-2 Score: 0  Financial Resource Strain: Low Risk    Difficulty of Paying Living Expenses: Not hard at all  Food Insecurity: No Food Insecurity   Worried About Charity fundraiser  in the Last Year: Never true   Ran Out of Food in the Last Year: Never true  Housing: Low Risk    Last Housing Risk Score: 0  Physical Activity: Sufficiently Active   Days of Exercise per Week: 5 days   Minutes of Exercise per Session: 40 min  Social Connections: Moderately Integrated   Frequency of Communication with Friends and Family: More than three times a week   Frequency of Social Gatherings with Friends and Family: More than three times a  week   Attends Religious Services: More than 4 times per year   Active Member of Clubs or Organizations: Yes   Attends Archivist Meetings: 1 to 4 times per year   Marital Status: Never married  Stress: Stress Concern Present   Feeling of Stress : To some extent  Tobacco Use: Low Risk    Smoking Tobacco Use: Never   Smokeless Tobacco Use: Never   Passive Exposure: Not on file  Transportation Needs: No Transportation Needs   Lack of Transportation (Medical): No   Lack of Transportation (Non-Medical): No    CCM Care Plan  Allergies  Allergen Reactions   Shellfish Allergy Anaphylaxis and Hives   Shellfish-Derived Products Anaphylaxis    Medications Reviewed Today     Reviewed by Mayford Knife, RPH (Pharmacist) on 09/04/21 at 1056  Med List Status: <None>   Medication Order Taking? Sig Documenting Provider Last Dose Status Informant  Accu-Chek Softclix Lancets lancets [id number] Yes Use to check blood sugars daily E11.69 Glendale Chard, MD  Active Self  albuterol (PROVENTIL HFA;VENTOLIN HFA) 108 (90 Base) MCG/ACT inhaler [id number] Yes Inhale 2 puffs into the lungs every 6 (six) hours as needed for wheezing or shortness of breath. Minette Brine, FNP  Active Self  Alcohol Swabs (ALCOHOL PADS) 70 % PADS [id number] Yes Use as directed to check blood sugars 1 time per day dx: e11.22 Glendale Chard, MD  Active Self  allopurinol (ZYLOPRIM) 100 MG tablet [id number] Yes TAKE 1 TABLET BY MOUTH EVERY DAY FOR GOUT Glendale Chard, MD   Active   amLODipine (NORVASC) 2.5 MG tablet [id number] Yes TAKE 1 TABLET(2.5 MG) BY MOUTH DAILY Glendale Chard, MD  Active Self  apixaban (ELIQUIS) 5 MG TABS tablet [id number] Yes Take 1 tablet (5 mg total) by mouth 2 (two) times daily. Nahser, Wonda Cheng, MD  Active   Ascorbic Acid (VITAMIN C) 1000 MG tablet [id number] Yes Take 1,000 mg by mouth daily. [provider]  Active Self  atorvastatin (LIPITOR) 80 MG tablet [id number] Yes TAKE 1 TABLET BY MOUTH EVERY DAY Glendale Chard, MD  Active     Discontinued 09/04/21 1055 (Change in therapy)   budesonide-formoterol (SYMBICORT) 160-4.5 MCG/ACT inhaler [id number]  INHALE 2 PUFFS BY MOUTH TWICE DAILY IN THE MORNING AND IN THE Fredric Mare, MD  Active   Calcium Carb-Cholecalciferol 600-800 MG-UNIT TABS [id number] Yes Take 1 tablet by mouth daily.  [provider]  Active Self  Cholecalciferol (VITAMIN D PO) [id number] Yes Take 2,000 Units by mouth daily. [provider]  Active   cyanocobalamin 1000 MCG tablet [id number] Yes Take 1,000 mcg by mouth daily. [provider]  Active Self  denosumab (PROLIA) 60 MG/ML SOSY injection [id number] Yes Inject 60 mg into the skin every 6 (six) months. Appt on 06/07/21 Ofilia Neas, PA-C  Active   glucose blood (ACCU-CHEK GUIDE) test strip [id number] Yes Use to check blood sugars daily E11.69 Glendale Chard, MD  Active Self  hydroxychloroquine (PLAQUENIL) 200 MG tablet [id number] Yes Take 1 tablet (200 mg total) by mouth daily. Bo Merino, MD  Active   Hypromellose (ARTIFICIAL TEARS OP) [id number] Yes Apply 1 drop to eye daily as needed (dry eyes). [provider]  Active Self  irbesartan (AVAPRO) 300 MG tablet [id number] Yes TAKE 1 TABLET(300 MG) BY MOUTH DAILY Glendale Chard, MD  Active Self  iron polysaccharides (NIFEREX) 150 MG capsule [id number] Yes Take 150 mg by mouth daily. [provider]  Active  Med Note (LITTLE, ANGEL L   Fri Nov 01, 2020   1:05 PM)    Liniments [facility]) [id number] Yes Apply 1 application topically daily as needed (pain). [provider]  Active Self  metoprolol succinate (TOPROL-XL) 25 MG 24 hr tablet [id number] Yes Take 1 tablet (25 mg total) by mouth daily. Nahser, Wonda Cheng, MD  Active     Discontinued 09/04/21 1056 (Change in therapy)   SYNTHROID 88 MCG tablet [id number] Yes TAKE 1 TABLET BY MOUTH EVERY DAY MONDAY TO SATURDAY AND OFF ON Nemiah Commander, MD  Active   trolamine salicylate (ASPERCREME) 10 % cream [id number] Yes Apply 1 application topically daily as needed for muscle pain. [provider]  Active Self            Patient Active Problem List   Diagnosis Date Noted   Chronic bilateral low back pain without sciatica 06/25/2021   Paresthesia and pain of extremity 06/25/2021   Atrial fibrillation ([place]) 04/16/2021   Demand ischemia ([place]) 03/26/2021   Unspecified atrial fibrillation ([place]) 03/26/2021   COVID-19 virus infection 03/26/2021   PVC (premature ventricular contraction) 05/06/2020   Type 2 diabetes mellitus with stage 3 chronic kidney disease, without long-term current use of insulin ([place]) 03/20/2020   Palpitations 03/20/2020   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/2020   Primary hypothyroidism 03/20/2020   Vitiligo 03/20/2020   Iron deficiency anemia 08/12/2017   Osteoporosis 10/02/2016   Systemic lupus erythematosus ([place]) 10/02/2016   Rheumatoid arthritis involving multiple sites with positive rheumatoid factor ([place]) 10/02/2016   High risk medication use 10/02/2016   History of chronic kidney disease 10/02/2016   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/2016   Primary osteoarthritis of both knees 10/02/2016   Vitamin D deficiency 10/02/2016   History of diabetes mellitus 10/02/2016   History of hypertension 10/02/2016   History of asthma 10/02/2016   Absolute anemia    MGUS (monoclonal gammopathy of unknown significance)    Normocytic  anemia 03/21/2015   Essential hypertension 03/20/2015   Abnormal CT of the chest 07/30/2012   Asthma 06/19/2012    Immunization History  Administered Date(s) Administered   19-influenza Whole 07/15/2012   DTaP 06/18/2019   Fluad Quad(high Dose 65+) 06/19/2020, 06/03/2021   Influenza Split 06/28/2014   Influenza, High Dose Seasonal PF 05/18/2019   Influenza,inj,Quad PF,6+ Mos 06/20/2018   Moderna SARS-COV2 Booster Vaccination 01/01/2021   Moderna Sars-Covid-2 Vaccination 11/20/2019, 12/19/2019, 08/06/2020   Pneumococcal Conjugate-13 05/04/2018   Pneumococcal Polysaccharide-23 05/18/2019   Pneumococcal-Unspecified 06/28/2014   Tdap 06/15/2019   Zoster Recombinat (Shingrix) 02/19/2021, 04/24/2021    Conditions to be addressed/monitored:  Hypertension and Asthma  Care Plan : CCM Pharmacy Care Plan  Updates made by Mayford Knife, [place] since 09/09/2021 12:00 AM     Problem: HTN, Asthma   Priority: High     Long-Range Goal: Disease Management   Recent Progress: On track  Priority: High  Note:   Current Barriers:  Unable to independently monitor therapeutic efficacy  Pharmacist Clinical Goal(s):  Patient will verbalize ability to afford treatment regimen achieve adherence to monitoring guidelines and medication adherence to achieve therapeutic efficacy through collaboration with PharmD and provider.   Interventions: 1:1 collaboration with Glendale Chard, MD regarding development and update of comprehensive plan of care as evidenced by provider attestation and co-signature Inter-disciplinary care team collaboration (see longitudinal plan of care) Comprehensive medication review performed; medication list updated in electronic medical record  Hypertension (BP goal <130/80) -Controlled -  Current treatment: Irbersartan 300 mg tablet once per day  Amlodipine 2.5 mg tablet once per day  Metoprolol succinate 25 mg tablet once per day  -Current home readings: she is checking  her BP frequently,  145/77, 152/74,  155/75 - she got her BP monitor this year  -Current dietary habits: collaborated with PCP team  -Current exercise habits: she is still exercising  -Denies hypotensive/hypertensive symptoms  -Educated on Daily salt intake goal < 2300 mg; Importance of home blood pressure monitoring; Proper BP monitoring technique; -Counseled to monitor BP at home at least three times per week, document, and provide log at future appointments -Counseled on diet and exercise extensively Recommended to continue current medication Educated on the importance of cutting out salt per consultation with Dr. Baird Cancer.   Asthma (Goal: control symptoms and prevent exacerbations) -Not ideally controlled -Current treatment  Symbicort 160-4.5 mg mcg/act - inhale 2 puffs into the lungs by mouth twice daily in the morning and evenings  -Exacerbations requiring treatment in last 6 months: [place] -Patient denies consistent use of maintenance inhaler -Patient reports that the symbicort inhaler has been working for the wheezing.  -Frequency of rescue inhaler use: patient is not currently using a rescue inhaler -Counseled on Benefits of consistent maintenance inhaler use -Recommended to continue current medication  Hyperlipidemia: (LDL goal < 70) -Controlled -Current treatment: Atorvastatin 80 mg tablet once per day -Current dietary patterns: she is trying to avoid fried and fatty foods.  -Current exercise habits: she is still exercising over 150 minutes per week, congratulated her on keeping up that schedule  -Educated on Benefits of statin for ASCVD risk reduction; Importance of limiting foods high in cholesterol; -Counseled on diet and exercise extensively -Discussed the importance of statin medication and dosing.    Patient Goals/Self-Care Activities Patient will:  - take medications as prescribed as evidenced by patient report and record review  Follow Up Plan: The patient has  been provided with contact information for the care management team and has been advised to call with any health related questions or concerns.       Medication Assistance:  Synthroid obtained through [employer] medication assistance program.  Enrollment ends 08/2022 per the letter the patient received.   Symbicort patient assistance pending approval.    Compliance/Adherence/Medication fill history: Care Gaps: COVID-19 Booster 08/13/2021   Star-Rating Drugs: Atorvastatin 80 mg tablet  Irbersartan 300 mg tablet    Patient's preferred pharmacy is:  [id number],  - [address] AT [address] [place] [address] Phone: [phone] Fax: [place] [address] Phone: [phone] Fax: [phone]  [facility] [id number] - [address], [place] [address] Phone: [phone] Fax: [phone]  Uses pill box? Yes Pt endorses 90% compliance  We discussed: Benefits of medication synchronization, packaging and delivery as well as enhanced pharmacist oversight with [employer]. Patient decided to: Continue current medication management strategy  Care Plan and Follow Up Patient Decision:  Patient agrees to Care Plan and Follow-up.  Plan: The patient has been provided with contact information for the care management team and has been advised to call with any health related questions or concerns.   [name], CPP, PharmD Clinical Pharmacist Practitioner [facility] [phone]

## 2021-09-09 NOTE — Patient Instructions (Signed)
Visit Information It was great speaking with you today!  Please let me know if you have any questions about our visit.   Goals Addressed             This Visit's Progress    Track and Manage My Blood Pressure-Hypertension       Timeframe:  Long-Range Goal Priority:  Medium Start Date:  01/13/21                          Expected End Date: 01/09/2022                Follow Up Date: 01/09/2022   In process:   Self-Care Activities: Self administers medications as prescribed Attends all scheduled provider appointments Calls provider office for new concerns, questions, or BP outside discussed parameters Checks BP and records as discussed Follows a low sodium diet/DASH diet Patient Goals: - check blood pressure 5 times per week - choose a place to take my blood pressure (home, clinic or office, retail store) - write blood pressure results in a log or diary - learn about high blood pressure   Why is this important?   You won't feel high blood pressure, but it can still hurt your blood vessels.  Making lifestyle changes like losing a little weight or eating less salt will help.  Checking your blood pressure at home and at different times of the day can help to control blood pressure.  If the doctor prescribes medicine remember to take it the way the doctor ordered.  Call the office if you cannot afford the medicine or if there are questions about it.             Patient Care Plan: CCM Pharmacy Care Plan     Problem Identified: HTN, Asthma   Priority: High     Long-Range Goal: Disease Management   Recent Progress: On track  Priority: High  Note:   Current Barriers:  Unable to independently monitor therapeutic efficacy  Pharmacist Clinical Goal(s):  Patient will verbalize ability to afford treatment regimen achieve adherence to monitoring guidelines and medication adherence to achieve therapeutic efficacy through collaboration with PharmD and provider.    Interventions: 1:1 collaboration with Glendale Chard, MD regarding development and update of comprehensive plan of care as evidenced by provider attestation and co-signature Inter-disciplinary care team collaboration (see longitudinal plan of care) Comprehensive medication review performed; medication list updated in electronic medical record  Hypertension (BP goal <130/80) -Controlled -Current treatment: Irbersartan 300 mg tablet once per day  Amlodipine 2.5 mg tablet once per day  Metoprolol succinate 25 mg tablet once per day  -Current home readings: she is checking her BP frequently,  145/77, 152/74,  155/75 - she got her BP monitor this year  -Current dietary habits: collaborated with PCP team  -Current exercise habits: she is still exercising  -Denies hypotensive/hypertensive symptoms  -Educated on Daily salt intake goal < 2300 mg; Importance of home blood pressure monitoring; Proper BP monitoring technique; -Counseled to monitor BP at home at least three times per week, document, and provide log at future appointments -Counseled on diet and exercise extensively Recommended to continue current medication Educated on the importance of cutting out salt per consultation with Dr. Baird Cancer.   Asthma (Goal: control symptoms and prevent exacerbations) -Not ideally controlled -Current treatment  Symbicort 160-4.5 mg mcg/act - inhale 2 puffs into the lungs by mouth twice daily in the morning and evenings  -Exacerbations  requiring treatment in last 6 months: [place] -Patient denies consistent use of maintenance inhaler -Patient reports that the symbicort inhaler has been working for the wheezing.  -Frequency of rescue inhaler use: patient is not currently using a rescue inhaler -Counseled on Benefits of consistent maintenance inhaler use -Recommended to continue current medication  Hyperlipidemia: (LDL goal < 70) -Controlled -Current treatment: Atorvastatin 80 mg tablet once per  day -Current dietary patterns: she is trying to avoid fried and fatty foods.  -Current exercise habits: she is still exercising over 150 minutes per week, congratulated her on keeping up that schedule  -Educated on Benefits of statin for ASCVD risk reduction; Importance of limiting foods high in cholesterol; -Counseled on diet and exercise extensively -Discussed the importance of statin medication and dosing.    Patient Goals/Self-Care Activities Patient will:  - take medications as prescribed as evidenced by patient report and record review  Follow Up Plan: The patient has been provided with contact information for the care management team and has been advised to call with any health related questions or concerns.     Patient Care Plan: Chronic Kidney Disease     Problem Identified: Chronic Kidney Disease   Priority: Medium     Long-Range Goal: Chronic Kidney Disease - disease progression prevented or minimized   Start Date: 01/13/2021  Expected End Date: 01/13/2022  Recent Progress: On track  Priority: Medium  Note:   Objective:  Lab Results  Component Value Date   HGBA1C 5.4 06/25/2021   Lab Results  Component Value Date   CREATININE 1.07 (H) 05/23/2021   CREATININE 1.10 (H) 04/16/2021   CREATININE 1.21 (H) 04/08/2021   Lab Results  Component Value Date   EGFR 54 (L) 05/23/2021    Current Barriers:  Ineffective Self Health Maintenance  Clinical Goal(s):  Collaboration with Glendale Chard, MD regarding development and update of comprehensive plan of care as evidenced by provider attestation and co-signature Inter-disciplinary care team collaboration (see longitudinal plan of care) patient will work with care management team to address care coordination and chronic disease management needs related to Disease Management Educational Needs Care Coordination Medication Management and Education Psychosocial Support   Interventions:  Evaluation of current treatment  plan related to CKD Stage III , self-management and patient's adherence to plan as established by provider. Collaboration with Glendale Chard, MD regarding development and update of comprehensive plan of care as evidenced by provider attestation       and co-signature Inter-disciplinary care team collaboration (see longitudinal plan of care) Provided education to patient about basic disease process related to Chronic Kidney Disease  Review of patient status, including review of consultants reports, relevant laboratory and other test results, and medications completed. Reviewed medications with patient and discussed importance of medication adherence Educated patient on dietary and exercise recommendations, increase water to 64 oz daily unless otherwise directed  Discussed plans with patient for ongoing care management follow up and provided patient with direct contact information for care management team Self Care Activities:  Continue to adhere to MD recommendations for CKD  Continue to keep all scheduled follow up appointments Take medications as directed  Let your healthcare team know if you are unable to take your medications Call your pharmacy for refills at least 7 days prior to running out of medication Patient Goals: - increase water to 64 oz daily - keep BP under good control per parameters set by MD       Follow Up Plan: Telephone follow up  appointment with care management team member scheduled for: 10/22/21        Patient agreed to services and verbal consent obtained.   The patient verbalized understanding of instructions, educational materials, and care plan provided today and agreed to receive a mailed copy of patient instructions, educational materials, and care plan.   [name], PharmD Clinical Pharmacist [facility] [phone]

## 2021-09-15 ENCOUNTER — Telehealth: Payer: Self-pay

## 2021-09-15 NOTE — Chronic Care Management (AMB) (Signed)
Chronic Care Management Pharmacy Assistant   Name: SIDRAH HARDEN  MRN: [id number] DOB: 05-Feb-1946  Reason for Encounter: Follow up call   09/15/2021- Patient called with question regarding ActX Study deadline, she would like to participate but not sure if she can make it into the office before the deadline, patient aware I am not aware of the study deadline at this time, I am attending a meeting tomorrow and once I find out the deadline I will call patient with update and coordinate time for patient to come into the office to participate in ActX pharmacogenetic Study. Patient also had some questions regarding patient assistance applications for Symbicort, assisted patient with paperwork, patient will plan to bring forms to PCP office one deadline for study deteremined.  09/16/2021- Patient aware she is still able to participate in the study and will come to the office on 09/17/2021 at 11:30 am.   09/17/2021- Patient arrived to the office for ActX Study but prior to collecting specimen patient informed me that she just put a cough drop in her mouth 5 min's ago and did not know if that would have effects to her results. Informed patient that it would effect her result and suggested that we reschedule study, advised patient to not drink any fluids 2 hours prior to coming in to study, rescheduled for 09/25/2021 at 9:30 am.  09/26/2021- Called patient to reschedule 09/25/2021 visit due to being out sick. Patient understood, rescheduled with patient for 10/08/2021 at 10:00 AM.    Medications: Outpatient Encounter Medications as of 09/15/2021  Medication Sig   Accu-Chek Softclix Lancets lancets Use to check blood sugars daily E11.69   albuterol (PROVENTIL HFA;VENTOLIN HFA) 108 (90 Base) MCG/ACT inhaler Inhale 2 puffs into the lungs every 6 (six) hours as needed for wheezing or shortness of breath.   Alcohol Swabs (ALCOHOL PADS) 70 % PADS Use as directed to check blood sugars 1 time per day  dx: e11.22   allopurinol (ZYLOPRIM) 100 MG tablet TAKE 1 TABLET BY MOUTH EVERY DAY FOR GOUT   amLODipine (NORVASC) 2.5 MG tablet TAKE 1 TABLET(2.5 MG) BY MOUTH DAILY   apixaban (ELIQUIS) 5 MG TABS tablet Take 1 tablet (5 mg total) by mouth 2 (two) times daily.   Ascorbic Acid (VITAMIN C) 1000 MG tablet Take 1,000 mg by mouth daily.   atorvastatin (LIPITOR) 80 MG tablet TAKE 1 TABLET BY MOUTH EVERY DAY   budesonide-formoterol (SYMBICORT) 160-4.5 MCG/ACT inhaler INHALE 2 PUFFS BY MOUTH TWICE DAILY IN THE MORNING AND IN THE EVENING   Calcium Carb-Cholecalciferol 600-800 MG-UNIT TABS Take 1 tablet by mouth daily.    Cholecalciferol (VITAMIN D PO) Take 2,000 Units by mouth daily.   cyanocobalamin 1000 MCG tablet Take 1,000 mcg by mouth daily.   denosumab (PROLIA) 60 MG/ML SOSY injection Inject 60 mg into the skin every 6 (six) months. Appt on 06/07/21   glucose blood (ACCU-CHEK GUIDE) test strip Use to check blood sugars daily E11.69   hydroxychloroquine (PLAQUENIL) 200 MG tablet Take 1 tablet (200 mg total) by mouth daily.   Hypromellose (ARTIFICIAL TEARS OP) Apply 1 drop to eye daily as needed (dry eyes).   irbesartan (AVAPRO) 300 MG tablet TAKE 1 TABLET(300 MG) BY MOUTH DAILY   iron polysaccharides (NIFEREX) 150 MG capsule Take 150 mg by mouth daily.   Liniments (BEN GAY EX) Apply 1 application topically daily as needed (pain).   metoprolol succinate (TOPROL-XL) 25 MG 24 hr tablet Take 1 tablet (25 mg total)  by mouth daily.   SYNTHROID 88 MCG tablet TAKE 1 TABLET BY MOUTH EVERY DAY MONDAY TO SATURDAY AND OFF ON SUNDAYS   trolamine salicylate (ASPERCREME) 10 % cream Apply 1 application topically daily as needed for muscle pain.   No facility-administered encounter medications on file as of 09/15/2021.    Pattricia Boss, [facility] Pharmacist Assistant [phone]

## 2021-09-18 ENCOUNTER — Encounter: Payer: Self-pay | Admitting: Internal Medicine

## 2021-09-18 DIAGNOSIS — H04123 Dry eye syndrome of bilateral lacrimal glands: Secondary | ICD-10-CM | POA: Diagnosis not present

## 2021-09-18 DIAGNOSIS — E119 Type 2 diabetes mellitus without complications: Secondary | ICD-10-CM | POA: Diagnosis not present

## 2021-09-18 DIAGNOSIS — Z79899 Other long term (current) drug therapy: Secondary | ICD-10-CM | POA: Diagnosis not present

## 2021-09-18 DIAGNOSIS — Z961 Presence of intraocular lens: Secondary | ICD-10-CM | POA: Diagnosis not present

## 2021-09-18 DIAGNOSIS — H26493 Other secondary cataract, bilateral: Secondary | ICD-10-CM | POA: Diagnosis not present

## 2021-09-18 DIAGNOSIS — M329 Systemic lupus erythematosus, unspecified: Secondary | ICD-10-CM | POA: Diagnosis not present

## 2021-09-18 LAB — HM DIABETES EYE EXAM

## 2021-09-19 NOTE — Telephone Encounter (Signed)
Received fax from [facility] stating that pt has been approved and is eligible to receive Eliquis free of charge from 10/01/2021 through 09/27/2022. Pt has been notified.

## 2021-09-24 DIAGNOSIS — M9902 Segmental and somatic dysfunction of thoracic region: Secondary | ICD-10-CM | POA: Diagnosis not present

## 2021-09-24 DIAGNOSIS — M5136 Other intervertebral disc degeneration, lumbar region: Secondary | ICD-10-CM | POA: Diagnosis not present

## 2021-09-24 DIAGNOSIS — M5137 Other intervertebral disc degeneration, lumbosacral region: Secondary | ICD-10-CM | POA: Diagnosis not present

## 2021-09-24 DIAGNOSIS — M47814 Spondylosis without myelopathy or radiculopathy, thoracic region: Secondary | ICD-10-CM | POA: Diagnosis not present

## 2021-09-24 DIAGNOSIS — M9903 Segmental and somatic dysfunction of lumbar region: Secondary | ICD-10-CM | POA: Diagnosis not present

## 2021-09-24 DIAGNOSIS — M9905 Segmental and somatic dysfunction of pelvic region: Secondary | ICD-10-CM | POA: Diagnosis not present

## 2021-09-25 ENCOUNTER — Telehealth: Payer: Self-pay | Admitting: *Deleted

## 2021-09-25 NOTE — Telephone Encounter (Signed)
° °  Pre-operative Risk Assessment    Patient Name: Hannah [name]  DOB: [birth year] MRN: [id number]      Request for Surgical Clearance    Procedure:   RIGHT TOTAL  KNEE ARTHROPLASTY  Date of Surgery:  Clearance 02/02/22                                 Surgeon:  DR. Gaynelle Arabian Surgeon's Group or Practice Name:  [facility] Phone number:  [phone] Fax number:  [phone] ATTN: Glendale Chard   Type of Clearance Requested:   - Medical  - Pharmacy:  Hold Apixaban (Eliquis)     Type of Anesthesia:   CHOICE   Additional requests/questions:    Jiles Prows   09/25/2021, 5:12 PM

## 2021-09-26 NOTE — Telephone Encounter (Signed)
Patient has an upcoming appointment with her primary cardiologist, Dr. Acie Fredrickson, in January 2023. Will ask Dr. Acie Fredrickson to address clearance, however patient will need follow-up call from preop team closer to date of surgery. Will ask [facility] to resubmit request within 2 months of patient's surgery.

## 2021-09-27 DIAGNOSIS — M47816 Spondylosis without myelopathy or radiculopathy, lumbar region: Secondary | ICD-10-CM | POA: Diagnosis not present

## 2021-09-29 ENCOUNTER — Other Ambulatory Visit: Payer: Self-pay | Admitting: Rheumatology

## 2021-09-29 ENCOUNTER — Other Ambulatory Visit: Payer: Self-pay | Admitting: Internal Medicine

## 2021-09-29 DIAGNOSIS — M0579 Rheumatoid arthritis with rheumatoid factor of multiple sites without organ or systems involvement: Secondary | ICD-10-CM

## 2021-09-30 ENCOUNTER — Telehealth: Payer: Self-pay | Admitting: Rheumatology

## 2021-09-30 ENCOUNTER — Telehealth: Payer: Self-pay

## 2021-09-30 NOTE — Telephone Encounter (Signed)
I returned the pt's call and notified her that she can to [facility] for her shoulder pain.

## 2021-09-30 NOTE — Telephone Encounter (Signed)
Next Visit: 11/14/2021  Last Visit: 06/13/2021  Labs: 07/14/2021 Creat. 1.13 GFR 51, RBC 3.17, Hgb 10.2, Hct 31.6  Eye exam: 09/18/2021 WNL   Current Dose per office note 06/13/2021: Plaquenil 1 tablet p.o. daily.  [id number] arthritis involving multiple sites with positive rheumatoid factor  Last Fill: 06/13/2021  Okay to refill Plaquenil?

## 2021-10-01 DIAGNOSIS — M25511 Pain in right shoulder: Secondary | ICD-10-CM | POA: Diagnosis not present

## 2021-10-01 DIAGNOSIS — M47814 Spondylosis without myelopathy or radiculopathy, thoracic region: Secondary | ICD-10-CM | POA: Diagnosis not present

## 2021-10-01 DIAGNOSIS — M5136 Other intervertebral disc degeneration, lumbar region: Secondary | ICD-10-CM | POA: Diagnosis not present

## 2021-10-01 DIAGNOSIS — M9903 Segmental and somatic dysfunction of lumbar region: Secondary | ICD-10-CM | POA: Diagnosis not present

## 2021-10-01 DIAGNOSIS — M9902 Segmental and somatic dysfunction of thoracic region: Secondary | ICD-10-CM | POA: Diagnosis not present

## 2021-10-01 DIAGNOSIS — M9905 Segmental and somatic dysfunction of pelvic region: Secondary | ICD-10-CM | POA: Diagnosis not present

## 2021-10-01 DIAGNOSIS — M5137 Other intervertebral disc degeneration, lumbosacral region: Secondary | ICD-10-CM | POA: Diagnosis not present

## 2021-10-01 NOTE — Telephone Encounter (Signed)
error 

## 2021-10-08 ENCOUNTER — Telehealth: Payer: Self-pay

## 2021-10-08 ENCOUNTER — Other Ambulatory Visit: Payer: Self-pay | Admitting: Internal Medicine

## 2021-10-08 DIAGNOSIS — M25511 Pain in right shoulder: Secondary | ICD-10-CM | POA: Diagnosis not present

## 2021-10-12 ENCOUNTER — Encounter: Payer: Self-pay | Admitting: Cardiovascular Disease

## 2021-10-12 NOTE — Progress Notes (Signed)
Cardiology Office Note:    Date:  10/14/2021   ID:  [name], [name] 09-25-1946, MRN [id number]  PCP:  Glendale Chard, MD  [facility] Cardiologist:  New to [place] Electrophysiologist:  None   Referring MD: Glendale Chard, MD   Chief Complaint  Patient presents with   Hypertension   Atrial Fibrillation    History of Present Illness:    Hannah [name] is a 76 y.o. female with a hx of palpitations.   We are asked to see her by Dr. Baird Cancer for further evaluation of these palpitations.   Hx of hyperlipidemia, hy[pothyroidism, HTN    These palpitations occur randomly.  Typically at night when she is trying to go to sleep Occurs rarely.  started 4 months ago.   Last episode  Was a month ago  Typically at night when she is in bed.   No dizziness,  No sweats.  Single isolated HR irreg.  Exercises 4 times a week.   Not limited by palpitations  ECGs have been normal  TSH is normal and has been stable   Has had both covid vaccines Sees hematology for mild anemia    April 16, 2021: Ortha is seen today for follow up of her palpitations.  She was found to have PAF during a COVID infection   ( See Media - March 26, 2021, Photos)  She was started on Eliquis 5 m gBID and  toprol XL 25 mg a day   CHADS2VASC is 82 ( age 44, female, HTN, DM)  She needs to have a knee replacement at some point She is at low risk for her upcoming knee surgery   Jan. 16, 2023: [name] is seen today for follow up of her PAF ( occurred during a COVID infection) She is on eliquis CHADS2VASC is 5   BP has been ok at home  No palpitations to suggest   Past Medical History:  Diagnosis Date   Anemia    Arthritis    Asthma    Atrial fibrillation ([place])    Diabetes mellitus without complication ([place])    Gout 12/17/2014   patient reported   Hyperlipidemia    Hypertension    Systemic lupus erythematosus ([place])    Vitiligo     Past Surgical History:  Procedure  Laterality Date   CATARACT EXTRACTION Bilateral 2015   DILATION AND CURETTAGE OF UTERUS     DOPPLER ECHOCARDIOGRAPHY  05/2018   Internist to review with pt; potential heart murmur 06/20/18   keratosis removal  2021   SKIN SURGERY  11/30/2018   left side of face   TOOTH EXTRACTION      Current Medications: Current Meds  Medication Sig   Accu-Chek Softclix Lancets lancets Use to check blood sugars daily E11.69   albuterol (PROVENTIL HFA;VENTOLIN HFA) 108 (90 Base) MCG/ACT inhaler Inhale 2 puffs into the lungs every 6 (six) hours as needed for wheezing or shortness of breath.   Alcohol Swabs (ALCOHOL PADS) 70 % PADS Use as directed to check blood sugars 1 time per day dx: e11.22   allopurinol (ZYLOPRIM) 100 MG tablet TAKE 1 TABLET BY MOUTH EVERY DAY FOR GOUT   amLODipine (NORVASC) 2.5 MG tablet TAKE 1 TABLET(2.5 MG) BY MOUTH DAILY   apixaban (ELIQUIS) 5 MG TABS tablet Take 1 tablet (5 mg total) by mouth 2 (two) times daily.   Ascorbic Acid (VITAMIN C) 1000 MG tablet Take 1,000 mg by mouth daily.   atorvastatin (LIPITOR)  80 MG tablet TAKE 1 TABLET BY MOUTH EVERY DAY   budesonide-formoterol (SYMBICORT) 160-4.5 MCG/ACT inhaler INHALE 2 PUFFS BY MOUTH TWICE DAILY IN THE MORNING AND IN THE EVENING   Calcium Carb-Cholecalciferol 600-800 MG-UNIT TABS Take 1 tablet by mouth daily.    Cholecalciferol (VITAMIN D PO) Take 2,000 Units by mouth daily.   cyanocobalamin 1000 MCG tablet Take 1,000 mcg by mouth daily.   denosumab (PROLIA) 60 MG/ML SOSY injection Inject 60 mg into the skin every 6 (six) months. Appt on 06/07/21   glucose blood (ACCU-CHEK GUIDE) test strip Use to check blood sugars daily E11.69   hydroxychloroquine (PLAQUENIL) 200 MG tablet TAKE 1 TABLET(200 MG) BY MOUTH DAILY   Hypromellose (ARTIFICIAL TEARS OP) Apply 1 drop to eye daily as needed (dry eyes).   irbesartan (AVAPRO) 300 MG tablet TAKE 1 TABLET(300 MG) BY MOUTH DAILY   iron polysaccharides (NIFEREX) 150 MG capsule Take 150 mg  by mouth daily.   Liniments (BEN GAY EX) Apply 1 application topically daily as needed (pain).   metoprolol succinate (TOPROL-XL) 25 MG 24 hr tablet Take 1 tablet (25 mg total) by mouth daily.   SYNTHROID 88 MCG tablet TAKE 1 TABLET BY MOUTH EVERY DAY MONDAY TO SATURDAY AND OFF ON SUNDAYS   trolamine salicylate (ASPERCREME) 10 % cream Apply 1 application topically daily as needed for muscle pain.     Allergies:   Shellfish allergy and Shellfish-derived products   Social History   Socioeconomic History   Marital status: Single    Spouse name: Not on file   Number of children: 0   Years of education: Not on file   Highest education level: Not on file  Occupational History   Occupation: retired  Tobacco Use   Smoking status: Never   Smokeless tobacco: Never  Vaping Use   Vaping Use: Never used  Substance and Sexual Activity   Alcohol use: Not Currently   Drug use: No   Sexual activity: Not Currently    Comment: 1ST intercourse- 45 , partners- 40,   Other Topics Concern   Not on file  Social History Narrative   Not on file   Social Determinants of Health   Financial Resource Strain: Low Risk    Difficulty of Paying Living Expenses: Not hard at all  Food Insecurity: No Food Insecurity   Worried About Charity fundraiser in the Last Year: Never true   Arboriculturist in the Last Year: Never true  Transportation Needs: No Transportation Needs   Lack of Transportation (Medical): No   Lack of Transportation (Non-Medical): No  Physical Activity: Sufficiently Active   Days of Exercise per Week: 5 days   Minutes of Exercise per Session: 40 min  Stress: Stress Concern Present   Feeling of Stress : To some extent  Social Connections: Moderately Integrated   Frequency of Communication with Friends and Family: More than three times a week   Frequency of Social Gatherings with Friends and Family: More than three times a week   Attends Religious Services: More than 4 times per year    Active Member of [employer] or Organizations: Yes   Attends Archivist Meetings: 1 to 4 times per year   Marital Status: Never married     Family History: The patient's family history includes Diabetes in her father; Heart disease in her father; Hypertension in her mother, sister, and sister; Leukemia in her sister.  ROS:   Please see the history of  present illness.     All other systems reviewed and are negative.  EKGs/Labs/Other Studies Reviewed:    The following studies were reviewed today:   EKG:       Recent Labs: 03/26/2021: Magnesium 1.8 06/25/2021: TSH 1.020 07/14/2021: ALT 18; BUN 23; Creatinine 1.13; Hemoglobin 10.2; Platelets 168; Potassium 4.0; Sodium 143  Recent Lipid Panel    Component Value Date/Time   CHOL 153 12/30/2020 1254   TRIG 50 12/30/2020 1254   HDL 75 12/30/2020 1254   CHOLHDL 2.0 12/30/2020 1254   LDLCALC 67 12/30/2020 1254    Physical Exam:    Physical Exam: Blood pressure 130/65, pulse 74, height 4\' 11"  (1.499 m), weight 166 lb 12.8 oz (75.7 kg), SpO2 99 %.  GEN:  elderly mildy obese female , n no acute distress HEENT: Normal NECK: No JVD; No carotid bruits LYMPHATICS: No lymphadenopathy CARDIAC: RRR, 9-2/4 systolic murmur  RESPIRATORY:  Clear to auscultation without rales, wheezing or rhonchi  ABDOMEN: Soft, non-tender, non-distended MUSCULOSKELETAL:  No edema; No deformity  SKIN: Warm and dry NEUROLOGIC:  Alert and oriented x 3     ASSESSMENT:    1. Essential hypertension   2. Paroxysmal atrial fibrillation (HCC)    PLAN:     PAF : She was found to have an episode of paroxysmal atrial fibrillation when she had a COVID infection.   CHADS2VASC is 39 ( age 27, female, HTN, DM)  Cont eliquis  Remains in NSR   She needs to have knee surgeries.   She is at low risk for her surgery .   She may hold her eliquis for 2-3 days ( 3 days for spinal anesthesia, 2 days for general anesthesia ) prior to the operation     HTN:    BP is better .  Cont meds. Encuoraged regular exercise     Medication Adjustments/Labs and Tests Ordered: Current medicines are reviewed at length with the patient today.  Concerns regarding medicines are outlined above.  No orders of the defined types were placed in this encounter.   No orders of the defined types were placed in this encounter.    Patient Instructions  Medication Instructions:   Your physician recommends that you continue on your current medications as directed. Please refer to the Current Medication list given to you today.  *If you need a refill on your cardiac medications before your next appointment, please call your pharmacy*    Follow-Up: At [facility], you and your health needs are our priority.  As part of our continuing mission to provide you with exceptional heart care, we have created designated Provider Care Teams.  These Care Teams include your primary Cardiologist (physician) and Advanced Practice Providers (APPs -  Physician Assistants and Nurse Practitioners) who all work together to provide you with the care you need, when you need it.  We recommend signing up for the patient portal called "MyChart".  Sign up information is provided on this After Visit Summary.  MyChart is used to connect with patients for Virtual Visits (Telemedicine).  Patients are able to view lab/test results, encounter notes, upcoming appointments, etc.  Non-urgent messages can be sent to your provider as well.   To learn more about what you can do with MyChart, go to [email or web address].    Your next appointment:   1 year(s)  The format for your next appointment:   In Person  Provider:   Robbie Lis, PA-C, Nicholes Rough, PA-C, Melina Copa, PA-C, Cecilie Kicks,  NP, Ermalinda Barrios, PA-C, Christen Bame, NP, or Richardson Dopp, [place]     {     Signed, Mertie Moores, MD  10/14/2021 1:59 PM    [facility]

## 2021-10-13 NOTE — Chronic Care Management (AMB) (Signed)
° ° °  Chronic Care Management Pharmacy Assistant   Name: MAHAM QUINTIN  MRN: [id number] DOB: 1946/05/25  Reason for Encounter: ActX Pharmacogenetic Study   10/08/2021- The ActX pharmacogenomics process and benefits of testing was discussed with the patient. After the discussion, the patient made an informed consent to testing and the sample was collected and mailed to the external lab.  Medications: Outpatient Encounter Medications as of 10/08/2021  Medication Sig   Accu-Chek Softclix Lancets lancets Use to check blood sugars daily E11.69   albuterol (PROVENTIL HFA;VENTOLIN HFA) 108 (90 Base) MCG/ACT inhaler Inhale 2 puffs into the lungs every 6 (six) hours as needed for wheezing or shortness of breath.   Alcohol Swabs (ALCOHOL PADS) 70 % PADS Use as directed to check blood sugars 1 time per day dx: e11.22   allopurinol (ZYLOPRIM) 100 MG tablet TAKE 1 TABLET BY MOUTH EVERY DAY FOR GOUT   amLODipine (NORVASC) 2.5 MG tablet TAKE 1 TABLET(2.5 MG) BY MOUTH DAILY   apixaban (ELIQUIS) 5 MG TABS tablet Take 1 tablet (5 mg total) by mouth 2 (two) times daily.   Ascorbic Acid (VITAMIN C) 1000 MG tablet Take 1,000 mg by mouth daily.   atorvastatin (LIPITOR) 80 MG tablet TAKE 1 TABLET BY MOUTH EVERY DAY   budesonide-formoterol (SYMBICORT) 160-4.5 MCG/ACT inhaler INHALE 2 PUFFS BY MOUTH TWICE DAILY IN THE MORNING AND IN THE EVENING   Calcium Carb-Cholecalciferol 600-800 MG-UNIT TABS Take 1 tablet by mouth daily.    Cholecalciferol (VITAMIN D PO) Take 2,000 Units by mouth daily.   cyanocobalamin 1000 MCG tablet Take 1,000 mcg by mouth daily.   denosumab (PROLIA) 60 MG/ML SOSY injection Inject 60 mg into the skin every 6 (six) months. Appt on 06/07/21   glucose blood (ACCU-CHEK GUIDE) test strip Use to check blood sugars daily E11.69   hydroxychloroquine (PLAQUENIL) 200 MG tablet TAKE 1 TABLET(200 MG) BY MOUTH DAILY   Hypromellose (ARTIFICIAL TEARS OP) Apply 1 drop to eye daily as needed (dry eyes).    irbesartan (AVAPRO) 300 MG tablet TAKE 1 TABLET(300 MG) BY MOUTH DAILY   iron polysaccharides (NIFEREX) 150 MG capsule Take 150 mg by mouth daily.   Liniments (BEN GAY EX) Apply 1 application topically daily as needed (pain).   metoprolol succinate (TOPROL-XL) 25 MG 24 hr tablet Take 1 tablet (25 mg total) by mouth daily.   SYNTHROID 88 MCG tablet TAKE 1 TABLET BY MOUTH EVERY DAY MONDAY TO SATURDAY AND OFF ON SUNDAYS   trolamine salicylate (ASPERCREME) 10 % cream Apply 1 application topically daily as needed for muscle pain.   No facility-administered encounter medications on file as of 10/08/2021.    Pattricia Boss, [place] Pharmacist Assistant [phone]

## 2021-10-14 ENCOUNTER — Ambulatory Visit: Payer: Medicare HMO | Admitting: Cardiovascular Disease

## 2021-10-14 ENCOUNTER — Other Ambulatory Visit: Payer: Self-pay

## 2021-10-14 ENCOUNTER — Encounter: Payer: Self-pay | Admitting: Cardiovascular Disease

## 2021-10-14 VITALS — BP 130/65 | HR 74 | Ht 59.0 in | Wt 166.8 lb

## 2021-10-14 DIAGNOSIS — I480 Paroxysmal atrial fibrillation: Secondary | ICD-10-CM | POA: Diagnosis not present

## 2021-10-14 DIAGNOSIS — I10 Essential (primary) hypertension: Secondary | ICD-10-CM | POA: Diagnosis not present

## 2021-10-14 NOTE — Patient Instructions (Signed)
Medication Instructions:   Your physician recommends that you continue on your current medications as directed. Please refer to the Current Medication list given to you today.  *If you need a refill on your cardiac medications before your next appointment, please call your pharmacy*    Follow-Up: At [facility], you and your health needs are our priority.  As part of our continuing mission to provide you with exceptional heart care, we have created designated Provider Care Teams.  These Care Teams include your primary Cardiologist (physician) and Advanced Practice Providers (APPs -  Physician Assistants and Nurse Practitioners) who all work together to provide you with the care you need, when you need it.  We recommend signing up for the patient portal called "MyChart".  Sign up information is provided on this After Visit Summary.  MyChart is used to connect with patients for Virtual Visits (Telemedicine).  Patients are able to view lab/test results, encounter notes, upcoming appointments, etc.  Non-urgent messages can be sent to your provider as well.   To learn more about what you can do with MyChart, go to [email or web address].    Your next appointment:   1 year(s)  The format for your next appointment:   In Person  Provider:   Robbie Lis, PA-C, Nicholes Rough, PA-C, Melina Copa, PA-C, Cecilie Kicks, NP, Ermalinda Barrios, PA-C, Christen Bame, NP, or Richardson Dopp, PA-C     {

## 2021-10-15 ENCOUNTER — Ambulatory Visit (INDEPENDENT_AMBULATORY_CARE_PROVIDER_SITE_OTHER): Payer: Medicare HMO | Admitting: Internal Medicine

## 2021-10-15 ENCOUNTER — Encounter: Payer: Self-pay | Admitting: Internal Medicine

## 2021-10-15 VITALS — BP 120/70 | HR 72 | Temp 98.3°F | Ht 59.0 in | Wt 164.8 lb

## 2021-10-15 DIAGNOSIS — M0579 Rheumatoid arthritis with rheumatoid factor of multiple sites without organ or systems involvement: Secondary | ICD-10-CM

## 2021-10-15 DIAGNOSIS — E1122 Type 2 diabetes mellitus with diabetic chronic kidney disease: Secondary | ICD-10-CM

## 2021-10-15 DIAGNOSIS — I129 Hypertensive chronic kidney disease with stage 1 through stage 4 chronic kidney disease, or unspecified chronic kidney disease: Secondary | ICD-10-CM

## 2021-10-15 DIAGNOSIS — L819 Disorder of pigmentation, unspecified: Secondary | ICD-10-CM | POA: Diagnosis not present

## 2021-10-15 DIAGNOSIS — N1831 Chronic kidney disease, stage 3a: Secondary | ICD-10-CM | POA: Diagnosis not present

## 2021-10-15 DIAGNOSIS — Z01818 Encounter for other preprocedural examination: Secondary | ICD-10-CM

## 2021-10-15 DIAGNOSIS — D696 Thrombocytopenia, unspecified: Secondary | ICD-10-CM | POA: Insufficient documentation

## 2021-10-15 NOTE — Patient Instructions (Signed)

## 2021-10-15 NOTE — Progress Notes (Signed)
Hannah [name],acting as a Education administrator for Hannah Greenland, MD.,have documented all relevant documentation on the behalf of Hannah Greenland, MD,as directed by  Hannah Greenland, MD while in the presence of Hannah Greenland, MD.  This visit occurred during the SARS-CoV-2 public health emergency.  Safety protocols were in place, including screening questions prior to the visit, additional usage of staff PPE, and extensive cleaning of exam room while observing appropriate contact time as indicated for disinfecting solutions.  Subjective:     Patient ID: Hannah [name] , female    DOB: 05-21-1946 , 76 y.o.   MRN: [id number]   Chief Complaint  Patient presents with   Diabetes   Hypertension    HPI  She presents for diabetes and blood pressure check. She reports compliance with meds. Patient is scheduled for a knee replacement on 02/02/22.  She denies headaches, chest pain and shortness of breath. She is able to walk around her home without any SOB. She has been cleared by Cardiology for surgery.    Diabetes She presents for her follow-up diabetic visit. She has type 2 diabetes mellitus. There are no hypoglycemic associated symptoms. Pertinent negatives for hypoglycemia include no headaches. Pertinent negatives for diabetes include no blurred vision and no chest pain. There are no hypoglycemic complications. Diabetic complications include nephropathy. Risk factors for coronary artery disease include diabetes mellitus, dyslipidemia, hypertension, post-menopausal and sedentary lifestyle. She is following a diabetic diet. She participates in exercise three times a week. An ACE inhibitor/angiotensin II receptor blocker is being taken.  Hypertension This is a chronic problem. The current episode started more than 1 year ago. The problem has been gradually improving since onset. The problem is controlled. Pertinent negatives include no blurred vision, chest pain or headaches.    Past Medical  History:  Diagnosis Date   Anemia    Arthritis    Asthma    Atrial fibrillation ([place])    Diabetes mellitus without complication ([place])    Gout 12/17/2014   patient reported   Hyperlipidemia    Hypertension    Systemic lupus erythematosus ([place])    Vitiligo      Family History  Problem Relation Age of Onset   Heart disease Father    Diabetes Father    Hypertension Mother    Hypertension Sister    Hypertension Sister    Leukemia Sister      Current Outpatient Medications:    Accu-Chek Softclix Lancets lancets, Use to check blood sugars daily E11.69, Disp: 100 each, Rfl: 2   albuterol (PROVENTIL HFA;VENTOLIN HFA) 108 (90 Base) MCG/ACT inhaler, Inhale 2 puffs into the lungs every 6 (six) hours as needed for wheezing or shortness of breath., Disp: 1 Inhaler, Rfl: 2   Alcohol Swabs (ALCOHOL PADS) 70 % PADS, Use as directed to check blood sugars 1 time per day dx: e11.22, Disp: 150 each, Rfl: 2   allopurinol (ZYLOPRIM) 100 MG tablet, TAKE 1 TABLET BY MOUTH EVERY DAY FOR GOUT, Disp: 90 tablet, Rfl: 1   amLODipine (NORVASC) 2.5 MG tablet, TAKE 1 TABLET(2.5 MG) BY MOUTH DAILY, Disp: 30 tablet, Rfl: 11   apixaban (ELIQUIS) 5 MG TABS tablet, Take 1 tablet (5 mg total) by mouth 2 (two) times daily., Disp: 180 tablet, Rfl: 3   Ascorbic Acid (VITAMIN C) 1000 MG tablet, Take 1,000 mg by mouth daily., Disp: , Rfl:    atorvastatin (LIPITOR) 80 MG tablet, TAKE 1 TABLET BY MOUTH EVERY DAY, Disp: 90 tablet, Rfl:  1   budesonide-formoterol (SYMBICORT) 160-4.5 MCG/ACT inhaler, INHALE 2 PUFFS BY MOUTH TWICE DAILY IN THE MORNING AND IN THE EVENING, Disp: 10.2 g, Rfl: 2   Calcium Carb-Cholecalciferol 600-800 MG-UNIT TABS, Take 1 tablet by mouth daily. , Disp: , Rfl:    Cholecalciferol (VITAMIN D PO), Take 2,000 Units by mouth daily., Disp: , Rfl:    cyanocobalamin 1000 MCG tablet, Take 1,000 mcg by mouth daily., Disp: , Rfl:    denosumab (PROLIA) 60 MG/ML SOSY injection, Inject 60 mg into the skin every 6  (six) months. Appt on 06/07/21, Disp: 1 mL, Rfl: 0   glucose blood (ACCU-CHEK GUIDE) test strip, Use to check blood sugars daily E11.69, Disp: 100 each, Rfl: 2   hydroxychloroquine (PLAQUENIL) 200 MG tablet, TAKE 1 TABLET(200 MG) BY MOUTH DAILY, Disp: 90 tablet, Rfl: 0   Hypromellose (ARTIFICIAL TEARS OP), Apply 1 drop to eye daily as needed (dry eyes)., Disp: , Rfl:    irbesartan (AVAPRO) 300 MG tablet, TAKE 1 TABLET(300 MG) BY MOUTH DAILY, Disp: 90 tablet, Rfl: 2   iron polysaccharides (NIFEREX) 150 MG capsule, Take 150 mg by mouth daily., Disp: , Rfl:    Liniments (BEN GAY EX), Apply 1 application topically daily as needed (pain)., Disp: , Rfl:    metoprolol succinate (TOPROL-XL) 25 MG 24 hr tablet, Take 1 tablet (25 mg total) by mouth daily., Disp: 90 tablet, Rfl: 3   SYNTHROID 88 MCG tablet, TAKE 1 TABLET BY MOUTH EVERY DAY MONDAY TO SATURDAY AND OFF ON SUNDAYS, Disp: 90 tablet, Rfl: 0   trolamine salicylate (ASPERCREME) 10 % cream, Apply 1 application topically daily as needed for muscle pain., Disp: , Rfl:    Allergies  Allergen Reactions   Shellfish Allergy Anaphylaxis and Hives   Shellfish-Derived Products Anaphylaxis     Review of Systems  Constitutional: Negative.   Eyes:  Negative for blurred vision.  Respiratory: Negative.    Cardiovascular: Negative.  Negative for chest pain.  Gastrointestinal: Negative.   Neurological: Negative.  Negative for headaches.  Psychiatric/Behavioral: Negative.      Today's Vitals   10/15/21 1434  BP: 120/70  Pulse: 72  Temp: 98.3 F (36.8 C)  Weight: 164 lb 12.8 oz (74.8 kg)  Height: 4' 11"  (1.499 m)  PainSc: 0-No pain   Body mass index is 33.29 kg/m.  Wt Readings from Last 3 Encounters:  10/15/21 164 lb 12.8 oz (74.8 kg)  10/14/21 166 lb 12.8 oz (75.7 kg)  07/16/21 163 lb 4.8 oz (74.1 kg)     Objective:  Physical Exam Vitals and nursing note reviewed.  Constitutional:      Appearance: Normal appearance.  HENT:     Head:  Normocephalic and atraumatic.     Nose:     Comments: Masked     Mouth/Throat:     Comments: Masked  Eyes:     Extraocular Movements: Extraocular movements intact.  Cardiovascular:     Rate and Rhythm: Normal rate and regular rhythm.     Heart sounds: Murmur heard.  Pulmonary:     Effort: Pulmonary effort is normal.     Breath sounds: Normal breath sounds.  Musculoskeletal:     Cervical back: Normal range of motion.  Skin:    General: Skin is warm.     Comments: She c/o darkening of her right knee. She does not kneel. Not sure how this occurred. Not located on her left knee. Thinks hair is growing from her knee.   Neurological:  General: No focal deficit present.     Mental Status: She is alert.  Psychiatric:        Mood and Affect: Mood normal.        Behavior: Behavior normal.     Assessment And Plan:     1. Type 2 diabetes mellitus with stage 3a chronic kidney disease, without long-term current use of insulin (HCC) Comments: Chronic, I will check labs as below. She will rto in April 2023 for her next physical examination.  - BMP8+eGFR - Hemoglobin A1c  2. Hypertensive nephropathy Comments: Chronic, well controlled. No med changes. She is encouraged to follow low sodium diet.   3. Rheumatoid arthritis involving multiple sites with positive rheumatoid factor (HCC) Comments: Chronic, as per Rheum.  She will c/w hydroxychloroquine as per Rheum. She is enocuraged to follow an anti-inflammatory diet.   4. Hyperpigmentation Comments: Area is located on her right patella. Advised that rubbing w/ lemon may help with darkening of the skin. She will let me know if it persists.   5. Pre-op exam Comments: EKG performed, SB w/o acute changes.  - EKG 12-Lead   Patient was given opportunity to ask questions. Patient verbalized understanding of the plan and was able to repeat key elements of the plan. All questions were answered to their satisfaction.   I, Hannah Greenland, MD,  have reviewed all documentation for this visit. The documentation on 10/19/21 for the exam, diagnosis, procedures, and orders are all accurate and complete.   IF YOU HAVE BEEN REFERRED TO A SPECIALIST, IT MAY TAKE 1-2 WEEKS TO SCHEDULE/PROCESS THE REFERRAL. IF YOU HAVE NOT HEARD FROM US/SPECIALIST IN TWO WEEKS, PLEASE GIVE [place] A CALL AT [phone].   THE PATIENT IS ENCOURAGED TO PRACTICE SOCIAL DISTANCING DUE TO THE COVID-19 PANDEMIC.

## 2021-10-16 ENCOUNTER — Encounter: Payer: Self-pay | Admitting: Internal Medicine

## 2021-10-16 LAB — BMP8+EGFR
BUN/Creatinine Ratio: 17 (ref 12–28)
BUN: 23 mg/dL (ref 8–27)
CO2: 24 mmol/L (ref 20–29)
Calcium: 9.6 mg/dL (ref 8.7–10.3)
Chloride: 100 mmol/L (ref 96–106)
Creatinine, Ser: 1.39 mg/dL — ABNORMAL HIGH (ref 0.57–1.00)
Glucose: 93 mg/dL (ref 70–99)
Potassium: 4.6 mmol/L (ref 3.5–5.2)
Sodium: 138 mmol/L (ref 134–144)
eGFR: 39 mL/min/{1.73_m2} — ABNORMAL LOW (ref >59)

## 2021-10-16 LAB — HEMOGLOBIN A1C
Est. average glucose Bld gHb Est-mCnc: 120 mg/dL
Hgb A1c MFr Bld: 5.8 % — ABNORMAL HIGH (ref 4.8–5.6)

## 2021-10-17 DIAGNOSIS — M5136 Other intervertebral disc degeneration, lumbar region: Secondary | ICD-10-CM | POA: Diagnosis not present

## 2021-10-17 DIAGNOSIS — M9903 Segmental and somatic dysfunction of lumbar region: Secondary | ICD-10-CM | POA: Diagnosis not present

## 2021-10-17 DIAGNOSIS — M47814 Spondylosis without myelopathy or radiculopathy, thoracic region: Secondary | ICD-10-CM | POA: Diagnosis not present

## 2021-10-17 DIAGNOSIS — M5137 Other intervertebral disc degeneration, lumbosacral region: Secondary | ICD-10-CM | POA: Diagnosis not present

## 2021-10-17 DIAGNOSIS — M9902 Segmental and somatic dysfunction of thoracic region: Secondary | ICD-10-CM | POA: Diagnosis not present

## 2021-10-17 DIAGNOSIS — M9905 Segmental and somatic dysfunction of pelvic region: Secondary | ICD-10-CM | POA: Diagnosis not present

## 2021-10-21 DIAGNOSIS — M47816 Spondylosis without myelopathy or radiculopathy, lumbar region: Secondary | ICD-10-CM | POA: Diagnosis not present

## 2021-10-22 ENCOUNTER — Ambulatory Visit (INDEPENDENT_AMBULATORY_CARE_PROVIDER_SITE_OTHER): Payer: Medicare HMO

## 2021-10-22 ENCOUNTER — Telehealth: Payer: Medicare HMO

## 2021-10-22 DIAGNOSIS — I4891 Unspecified atrial fibrillation: Secondary | ICD-10-CM

## 2021-10-22 DIAGNOSIS — G8929 Other chronic pain: Secondary | ICD-10-CM

## 2021-10-22 DIAGNOSIS — E1122 Type 2 diabetes mellitus with diabetic chronic kidney disease: Secondary | ICD-10-CM

## 2021-10-22 DIAGNOSIS — M545 Low back pain, unspecified: Secondary | ICD-10-CM

## 2021-10-22 DIAGNOSIS — I129 Hypertensive chronic kidney disease with stage 1 through stage 4 chronic kidney disease, or unspecified chronic kidney disease: Secondary | ICD-10-CM

## 2021-10-22 DIAGNOSIS — N1831 Chronic kidney disease, stage 3a: Secondary | ICD-10-CM

## 2021-10-22 DIAGNOSIS — J4520 Mild intermittent asthma, uncomplicated: Secondary | ICD-10-CM

## 2021-10-22 DIAGNOSIS — M0579 Rheumatoid arthritis with rheumatoid factor of multiple sites without organ or systems involvement: Secondary | ICD-10-CM

## 2021-10-22 DIAGNOSIS — D508 Other iron deficiency anemias: Secondary | ICD-10-CM

## 2021-10-22 DIAGNOSIS — N183 Chronic kidney disease, stage 3 unspecified: Secondary | ICD-10-CM

## 2021-10-23 ENCOUNTER — Telehealth: Payer: Self-pay

## 2021-10-23 NOTE — Chronic Care Management (AMB) (Signed)
Chronic Care Management   CCM RN Visit Note  10/22/2021 Name: Hannah [name] MRN: [id number] DOB: [birth year]  Subjective: Hannah [name] is a 76 y.o. year old female who is a primary care patient of Glendale Chard, MD. The care management team was consulted for assistance with disease management and care coordination needs.    Engaged with patient by telephone for follow up visit in response to provider referral for case management and/or care coordination services.   Consent to Services:  The patient was given information about Chronic Care Management services, agreed to services, and gave verbal consent prior to initiation of services.  Please see initial visit note for detailed documentation.   Patient agreed to services and verbal consent obtained.   Assessment: Review of patient past medical history, allergies, medications, health status, including review of consultants reports, laboratory and other test data, was performed as part of comprehensive evaluation and provision of chronic care management services.   SDOH (Social Determinants of Health) assessments and interventions performed:  Yes, no acute needs   CCM Care Plan  Allergies  Allergen Reactions   Shellfish Allergy Anaphylaxis and Hives   Shellfish-Derived Products Anaphylaxis    Outpatient Encounter Medications as of 10/22/2021  Medication Sig   Accu-Chek Softclix Lancets lancets Use to check blood sugars daily E11.69   albuterol (PROVENTIL HFA;VENTOLIN HFA) 108 (90 Base) MCG/ACT inhaler Inhale 2 puffs into the lungs every 6 (six) hours as needed for wheezing or shortness of breath.   Alcohol Swabs (ALCOHOL PADS) 70 % PADS Use as directed to check blood sugars 1 time per day dx: e11.22   allopurinol (ZYLOPRIM) 100 MG tablet TAKE 1 TABLET BY MOUTH EVERY DAY FOR GOUT   amLODipine (NORVASC) 2.5 MG tablet TAKE 1 TABLET(2.5 MG) BY MOUTH DAILY   apixaban (ELIQUIS) 5 MG TABS tablet Take 1 tablet (5 mg total) by  mouth 2 (two) times daily.   Ascorbic Acid (VITAMIN C) 1000 MG tablet Take 1,000 mg by mouth daily.   atorvastatin (LIPITOR) 80 MG tablet TAKE 1 TABLET BY MOUTH EVERY DAY   budesonide-formoterol (SYMBICORT) 160-4.5 MCG/ACT inhaler INHALE 2 PUFFS BY MOUTH TWICE DAILY IN THE MORNING AND IN THE EVENING   Calcium Carb-Cholecalciferol 600-800 MG-UNIT TABS Take 1 tablet by mouth daily.    Cholecalciferol (VITAMIN D PO) Take 2,000 Units by mouth daily.   cyanocobalamin 1000 MCG tablet Take 1,000 mcg by mouth daily.   denosumab (PROLIA) 60 MG/ML SOSY injection Inject 60 mg into the skin every 6 (six) months. Appt on 06/07/21   glucose blood (ACCU-CHEK GUIDE) test strip Use to check blood sugars daily E11.69   hydroxychloroquine (PLAQUENIL) 200 MG tablet TAKE 1 TABLET(200 MG) BY MOUTH DAILY   Hypromellose (ARTIFICIAL TEARS OP) Apply 1 drop to eye daily as needed (dry eyes).   irbesartan (AVAPRO) 300 MG tablet TAKE 1 TABLET(300 MG) BY MOUTH DAILY   iron polysaccharides (NIFEREX) 150 MG capsule Take 150 mg by mouth daily.   Liniments (BEN GAY EX) Apply 1 application topically daily as needed (pain).   metoprolol succinate (TOPROL-XL) 25 MG 24 hr tablet Take 1 tablet (25 mg total) by mouth daily.   SYNTHROID 88 MCG tablet TAKE 1 TABLET BY MOUTH EVERY DAY MONDAY TO SATURDAY AND OFF ON SUNDAYS   trolamine salicylate (ASPERCREME) 10 % cream Apply 1 application topically daily as needed for muscle pain.   No facility-administered encounter medications on file as of 10/22/2021.    Patient Active Problem  List   Diagnosis Date Noted   Chronic bilateral low back pain without sciatica 06/25/2021   Paresthesia and pain of extremity 06/25/2021   Atrial fibrillation ([place]) 04/16/2021   Demand ischemia ([place]) 03/26/2021   Unspecified atrial fibrillation ([place]) 03/26/2021   COVID-19 virus infection 03/26/2021   PVC (premature ventricular contraction) 05/06/2020   Type 2 diabetes mellitus with stage 3 chronic kidney  disease, without long-term current use of insulin ([place]) 03/20/2020   Palpitations 03/20/2020   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/2020   Primary hypothyroidism 03/20/2020   Vitiligo 03/20/2020   Iron deficiency anemia 08/12/2017   Osteoporosis 10/02/2016   Systemic lupus erythematosus ([place]) 10/02/2016   Rheumatoid arthritis involving multiple sites with positive rheumatoid factor ([place]) 10/02/2016   High risk medication use 10/02/2016   History of chronic kidney disease 10/02/2016   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/2016   Primary osteoarthritis of both knees 10/02/2016   Vitamin D deficiency 10/02/2016   History of diabetes mellitus 10/02/2016   History of hypertension 10/02/2016   History of asthma 10/02/2016   Absolute anemia    MGUS (monoclonal gammopathy of unknown significance)    Normocytic anemia 03/21/2015   Essential hypertension 03/20/2015   Abnormal CT of the chest 07/30/2012   Asthma 06/19/2012    Conditions to be addressed/monitored: CKD III, hypertensive nephropathy, DM, RA, iron deficiency Anemia, Asthma  Care Plan : RN Care Manager Plan of Care  Updates made by Lynne Logan, RN since 10/22/2021 12:00 AM     Problem: No Plan of Care established for management of chronic disease states (CKD III, hypertensive nephropathy, DM, RA, iron deficiency Anemia, Asthma)   Priority: High     Long-Range Goal: Establishment of plan of care for management of chronic disease states (CKD III, hypertensive nephropathy, DM, RA, iron deficiency Anemia, Asthma)   Start Date: 10/22/2021  Expected End Date: 10/22/2022  This Visit's Progress: On track  Priority: High  Note:   Current Barriers:  Knowledge Deficits related to plan of care for management of CKD III, hypertensive nephropathy, DM, RA, iron deficiency Anemia, Asthma  Chronic Disease Management support and education needs related to CKD III, hypertensive nephropathy, DM, RA, iron deficiency Anemia,  Asthma   RNCM Clinical Goal(s):  Patient will verbalize basic understanding of  CKD III, hypertensive nephropathy, DM, RA, iron deficiency Anemia, Asthma disease process and self health management plan as evidenced by patient will report having no disease exacerbations related to her chronic disease states as listed above take all medications exactly as prescribed and will call provider for medication related questions as evidenced by patient will demonstrate improved understanding of prescribed medications and rationale for usage as evidenced by patient teach back demonstrate Improved health management independence as evidenced by patient will report 100% to her prescribed treatment plan  continue to work with RN Care Manager to address care management and care coordination needs related to  CKD III, hypertensive nephropathy, DM, RA, iron deficiency Anemia, Asthma as evidenced by adherence to CM Team Scheduled appointments demonstrate ongoing self health care management ability   as evidenced by    through collaboration with RN Care manager, provider, and care team.   Interventions: 1:1 collaboration with primary care provider regarding development and update of comprehensive plan of care as evidenced by provider attestation and co-signature Inter-disciplinary care team collaboration (see longitudinal plan of care) Evaluation of current treatment plan related to  self management and patient's adherence to plan as established by provider  Chronic Kidney Disease Interventions:  (Status:  Goal on track:  Yes.) Long Term Goal Assessed the Patient understanding of chronic kidney disease    Evaluation of current treatment plan related to chronic kidney disease self management and patient's adherence to plan as established by provider      Reviewed prescribed diet continue to drink 64 oz of water daily unless otherwise directed by provider  Advised patient to discuss her declining renal function from  last check up with provider    Assessed social determinant of health barriers    Provided education on kidney disease progression    Determined patient will contact Dr. Justin Mend, Nephrologist to schedule a follow up visit  Mailed printed educational materials related to Eating Right with Chronic Kidney disease; Stages of Chronic Kidney disease Discussed plans with patient for ongoing care management follow up and provided patient with direct contact information for care management team Last practice recorded BP readings:  BP Readings from Last 3 Encounters:  10/15/21 120/70  10/14/21 130/65  07/16/21 (!) 162/63  Most recent eGFR/CrCl:  Lab Results  Component Value Date   EGFR 39 (L) 10/15/2021    No components found for: CRCL    Diabetes Interventions:  (Status:  New goal.) Long Term Goal Assessed patient's understanding of A1c goal:  <5.7 Provided education to patient about basic DM disease process Reviewed medications with patient and discussed importance of medication adherence Counseled on importance of regular laboratory monitoring as prescribed Review of patient status, including review of consultants reports, relevant laboratory and other test results, and medications completed Discussed plans with patient for ongoing care management follow up and provided patient with direct contact information for care management team Lab Results  Component Value Date   HGBA1C 5.8 (H) 10/15/2021   Hypertension Interventions:  (Status:  Goal on track:  Yes.) Long Term Goal Last practice recorded BP readings:  BP Readings from Last 3 Encounters:  10/15/21 120/70  10/14/21 130/65  07/16/21 (!) 162/63  Most recent eGFR/CrCl:  Lab Results  Component Value Date   EGFR 39 (L) 10/15/2021    No components found for: CRCL  Evaluation of current treatment plan related to hypertension self management and patient's adherence to plan as established by provider Provided education to patient re:  stroke prevention, s/s of heart attack and stroke Counseled on the importance of exercise goals with target of 150 minutes per week Advised patient, providing education and rationale, to monitor blood pressure daily and record, calling PCP for findings outside established parameters Provided education on prescribed diet low Sodium  Discussed complications of poorly controlled blood pressure such as heart disease, stroke, circulatory complications, vision complications, kidney impairment, sexual dysfunction Discussed plans with patient for ongoing care management follow up and provided patient with direct contact information for care management team  AFIB Interventions: (Status:  Condition stable.  Not addressed this visit.) Long Term Goal   Provided education to patient about basic DM disease process Review of patient status, including review of consultant's reports, relevant laboratory and other test results, and medications completed. Reviewed medications with patient and discussed importance of medication adherence Discussed plans with patient for ongoing care management follow up and provided patient with direct contact information for care management team  Anemia/Bleeding Interventions:  (Status:  Condition stable.  Not addressed this visit.) Long Term Goal Assessment of understanding of anemia/bleeding disorder diagnosis Basic overview and discussion of anemia/bleeding disorder or acute disease state Medications reviewed Discussed plans with patient for ongoing  care management follow up and provided patient with direct contact information for care management team Lab Results  Component Value Date   WBC 4.7 07/14/2021   HGB 10.2 (L) 07/14/2021   HCT 31.6 (L) 07/14/2021   MCV 99.7 07/14/2021   PLT 168 07/14/2021  Asthma: (Status:Condition stable.  Not addressed this visit.) Long Term Goal Provided education to patient about basic DM disease process Review of patient status, including  review of consultant's reports, relevant laboratory and other test results, and medications completed. Reviewed medications with patient and discussed importance of medication adherence Discussed plans with patient for ongoing care management follow up and provided patient with direct contact information for care management team   Pain Interventions:  (Status:  Goal on track:  Yes.) Long Term Goal Pain assessment performed Medications reviewed Reviewed provider established plan for pain management Counseled on the importance of reporting any/all new or changed pain symptoms or management strategies to pain management provider Advised patient to report to care team affect of pain on daily activities Discussed use of relaxation techniques and/or diversional activities to assist with pain reduction (distraction, imagery, relaxation, massage, acupressure, TENS, heat, and cold application Reviewed with patient prescribed pharmacological and nonpharmacological pain relief strategies Screening for signs and symptoms of depression related to chronic disease state  Assessed social determinant of health barriers  Discussed plans with patient for ongoing care management follow up and provided patient with direct contact information for care management team  Patient Goals/Self-Care Activities: Take all medications as prescribed Attend all scheduled provider appointments Call pharmacy for medication refills 3-7 days in advance of running out of medications Perform all self care activities independently  Perform IADL's (shopping, preparing meals, housekeeping, managing finances) independently Call provider office for new concerns or questions  drink 6 to 8 glasses of water each day manage portion size keep a blood pressure log take blood pressure log to all doctor appointments call doctor for signs and symptoms of high blood pressure take medications for blood pressure exactly as prescribed report  new symptoms to your doctor  Follow Up Plan:  Telephone follow up appointment with care management team member scheduled for:  01/02/22      Plan:Telephone follow up appointment with care management team member scheduled for:  01/02/22  Barb Merino, RN, BSN, CCM Care Management Coordinator [place] [facility]  Direct Phone: [phone]

## 2021-10-23 NOTE — Telephone Encounter (Signed)
°  Care Management   Follow Up Note   10/23/2021 Name: Hannah [name] MRN: [id number] DOB: Dec 01, 1945   Referred by: Glendale Chard, MD Reason for referral : Chronic Care Management (Inbound call from patient )  Received a voice message from patient stating she was able to get an appointment with Dr. Justin Mend, Nephrologist for evaluation of her chronic kidney disease. Her appointment is scheduled for Monday, 11/03/21 @8 :40 AM. The patient was referred to the case management team for assistance with care management and care coordination.   Follow Up Plan: Telephone follow up appointment with care management team member scheduled for: 01/02/22  Barb Merino, RN, BSN, CCM Care Management Coordinator [place] [facility]  Direct Phone: [phone]

## 2021-10-23 NOTE — Patient Instructions (Signed)
Visit Information  Thank you for taking time to visit with me today. Please don't hesitate to contact me if I can be of assistance to you before our next scheduled telephone appointment.  Following are the goals we discussed today:  (Copy and paste patient goals from clinical care plan here)  Our next appointment is by telephone on 01/02/22 at 10:30 AM  Please call the care guide team at [phone] if you need to cancel or reschedule your appointment.   If you are experiencing a Mental Health or [place] or need someone to talk to, please call [phone]-TALK (toll free, 24 hour hotline)   Patient verbalizes understanding of instructions and care plan provided today and agrees to view in [place]. Active MyChart status confirmed with patient.    Barb Merino, RN, BSN, CCM Care Management Coordinator [place] [facility]  Direct Phone: [phone]

## 2021-10-24 ENCOUNTER — Other Ambulatory Visit: Payer: Self-pay | Admitting: Internal Medicine

## 2021-10-24 DIAGNOSIS — Z79899 Other long term (current) drug therapy: Secondary | ICD-10-CM

## 2021-10-27 ENCOUNTER — Telehealth: Payer: Self-pay

## 2021-10-27 NOTE — Chronic Care Management (AMB) (Addendum)
° ° °  Chronic Care Management Pharmacy Assistant   Name: NILAH BELCOURT  MRN: [id number] DOB: [birth year]  Reason for Encounter: Patient assistance follow up    Medications: Outpatient Encounter Medications as of 10/27/2021  Medication Sig   Accu-Chek Softclix Lancets lancets Use to check blood sugars daily E11.69   albuterol (PROVENTIL HFA;VENTOLIN HFA) 108 (90 Base) MCG/ACT inhaler Inhale 2 puffs into the lungs every 6 (six) hours as needed for wheezing or shortness of breath.   Alcohol Swabs (ALCOHOL PADS) 70 % PADS Use as directed to check blood sugars 1 time per day dx: e11.22   allopurinol (ZYLOPRIM) 100 MG tablet TAKE 1 TABLET BY MOUTH EVERY DAY FOR GOUT   amLODipine (NORVASC) 2.5 MG tablet TAKE 1 TABLET(2.5 MG) BY MOUTH DAILY   apixaban (ELIQUIS) 5 MG TABS tablet Take 1 tablet (5 mg total) by mouth 2 (two) times daily.   Ascorbic Acid (VITAMIN C) 1000 MG tablet Take 1,000 mg by mouth daily.   atorvastatin (LIPITOR) 80 MG tablet TAKE 1 TABLET BY MOUTH EVERY DAY   budesonide-formoterol (SYMBICORT) 160-4.5 MCG/ACT inhaler INHALE 2 PUFFS BY MOUTH TWICE DAILY IN THE MORNING AND IN THE EVENING   Calcium Carb-Cholecalciferol 600-800 MG-UNIT TABS Take 1 tablet by mouth daily.    Cholecalciferol (VITAMIN D PO) Take 2,000 Units by mouth daily.   cyanocobalamin 1000 MCG tablet Take 1,000 mcg by mouth daily.   denosumab (PROLIA) 60 MG/ML SOSY injection Inject 60 mg into the skin every 6 (six) months. Appt on 06/07/21   glucose blood (ACCU-CHEK GUIDE) test strip Use to check blood sugars daily E11.69   hydroxychloroquine (PLAQUENIL) 200 MG tablet TAKE 1 TABLET(200 MG) BY MOUTH DAILY   Hypromellose (ARTIFICIAL TEARS OP) Apply 1 drop to eye daily as needed (dry eyes).   irbesartan (AVAPRO) 300 MG tablet TAKE 1 TABLET(300 MG) BY MOUTH DAILY   iron polysaccharides (NIFEREX) 150 MG capsule Take 150 mg by mouth daily.   Liniments (BEN GAY EX) Apply 1 application topically daily as needed (pain).    metoprolol succinate (TOPROL-XL) 25 MG 24 hr tablet Take 1 tablet (25 mg total) by mouth daily.   SYNTHROID 88 MCG tablet TAKE 1 TABLET BY MOUTH EVERY DAY MONDAY TO SATURDAY AND OFF ON SUNDAYS   trolamine salicylate (ASPERCREME) 10 % cream Apply 1 application topically daily as needed for muscle pain.   No facility-administered encounter medications on file as of 10/27/2021.   10-27-2021: Patient called stating that she received a letter from [employer] stating she was denied for the symbicort due to exceeding income limit and missing medicare card. Patient stated she would like to know what income was included saying she made 47,000 last year. Patient stated her income hasn't been that amount in years. Pattricia Boss will update patient with information when she goes to the office on 10-29-2021.  10-29-2021: Informed patient that income was 23,748. Patient request to leave folder with information at the desk to view. Informed Tamala melvin.  [place] Pharmacist Assistant [phone]

## 2021-10-27 NOTE — Chronic Care Management (AMB) (Signed)
° ° °  Chronic Care Management Pharmacy Assistant   Name: Hannah [name]  MRN: [id number] DOB: 1946/08/19  Reason for Encounter: Phone Call  10/27/2021- Patient contacted office needing a call from Pharmacy team. Called patient no answer, left message to return call.   Medications: Outpatient Encounter Medications as of 10/27/2021  Medication Sig   Accu-Chek Softclix Lancets lancets Use to check blood sugars daily E11.69   albuterol (PROVENTIL HFA;VENTOLIN HFA) 108 (90 Base) MCG/ACT inhaler Inhale 2 puffs into the lungs every 6 (six) hours as needed for wheezing or shortness of breath.   Alcohol Swabs (ALCOHOL PADS) 70 % PADS Use as directed to check blood sugars 1 time per day dx: e11.22   allopurinol (ZYLOPRIM) 100 MG tablet TAKE 1 TABLET BY MOUTH EVERY DAY FOR GOUT   amLODipine (NORVASC) 2.5 MG tablet TAKE 1 TABLET(2.5 MG) BY MOUTH DAILY   apixaban (ELIQUIS) 5 MG TABS tablet Take 1 tablet (5 mg total) by mouth 2 (two) times daily.   Ascorbic Acid (VITAMIN C) 1000 MG tablet Take 1,000 mg by mouth daily.   atorvastatin (LIPITOR) 80 MG tablet TAKE 1 TABLET BY MOUTH EVERY DAY   budesonide-formoterol (SYMBICORT) 160-4.5 MCG/ACT inhaler INHALE 2 PUFFS BY MOUTH TWICE DAILY IN THE MORNING AND IN THE EVENING   Calcium Carb-Cholecalciferol 600-800 MG-UNIT TABS Take 1 tablet by mouth daily.    Cholecalciferol (VITAMIN D PO) Take 2,000 Units by mouth daily.   cyanocobalamin 1000 MCG tablet Take 1,000 mcg by mouth daily.   denosumab (PROLIA) 60 MG/ML SOSY injection Inject 60 mg into the skin every 6 (six) months. Appt on 06/07/21   glucose blood (ACCU-CHEK GUIDE) test strip Use to check blood sugars daily E11.69   hydroxychloroquine (PLAQUENIL) 200 MG tablet TAKE 1 TABLET(200 MG) BY MOUTH DAILY   Hypromellose (ARTIFICIAL TEARS OP) Apply 1 drop to eye daily as needed (dry eyes).   irbesartan (AVAPRO) 300 MG tablet TAKE 1 TABLET(300 MG) BY MOUTH DAILY   iron polysaccharides (NIFEREX) 150 MG capsule  Take 150 mg by mouth daily.   Liniments (BEN GAY EX) Apply 1 application topically daily as needed (pain).   metoprolol succinate (TOPROL-XL) 25 MG 24 hr tablet Take 1 tablet (25 mg total) by mouth daily.   SYNTHROID 88 MCG tablet TAKE 1 TABLET BY MOUTH EVERY DAY MONDAY TO SATURDAY AND OFF ON SUNDAYS   trolamine salicylate (ASPERCREME) 10 % cream Apply 1 application topically daily as needed for muscle pain.   No facility-administered encounter medications on file as of 10/27/2021.    Pattricia Boss, [place] Pharmacist Assistant [phone]

## 2021-10-28 ENCOUNTER — Other Ambulatory Visit (HOSPITAL_COMMUNITY): Payer: Self-pay

## 2021-10-28 ENCOUNTER — Other Ambulatory Visit: Payer: Self-pay | Admitting: Physician Assistant

## 2021-10-28 DIAGNOSIS — N183 Chronic kidney disease, stage 3 unspecified: Secondary | ICD-10-CM | POA: Diagnosis not present

## 2021-10-28 DIAGNOSIS — J4520 Mild intermittent asthma, uncomplicated: Secondary | ICD-10-CM

## 2021-10-28 DIAGNOSIS — M0579 Rheumatoid arthritis with rheumatoid factor of multiple sites without organ or systems involvement: Secondary | ICD-10-CM

## 2021-10-28 DIAGNOSIS — D508 Other iron deficiency anemias: Secondary | ICD-10-CM | POA: Diagnosis not present

## 2021-10-28 DIAGNOSIS — N1831 Chronic kidney disease, stage 3a: Secondary | ICD-10-CM | POA: Diagnosis not present

## 2021-10-28 DIAGNOSIS — I4891 Unspecified atrial fibrillation: Secondary | ICD-10-CM

## 2021-10-28 DIAGNOSIS — E1122 Type 2 diabetes mellitus with diabetic chronic kidney disease: Secondary | ICD-10-CM | POA: Diagnosis not present

## 2021-10-28 DIAGNOSIS — M810 Age-related osteoporosis without current pathological fracture: Secondary | ICD-10-CM

## 2021-10-28 DIAGNOSIS — I129 Hypertensive chronic kidney disease with stage 1 through stage 4 chronic kidney disease, or unspecified chronic kidney disease: Secondary | ICD-10-CM | POA: Diagnosis not present

## 2021-10-29 ENCOUNTER — Other Ambulatory Visit (HOSPITAL_COMMUNITY): Payer: Self-pay

## 2021-10-31 DIAGNOSIS — M5136 Other intervertebral disc degeneration, lumbar region: Secondary | ICD-10-CM | POA: Diagnosis not present

## 2021-10-31 DIAGNOSIS — M47814 Spondylosis without myelopathy or radiculopathy, thoracic region: Secondary | ICD-10-CM | POA: Diagnosis not present

## 2021-10-31 DIAGNOSIS — M9902 Segmental and somatic dysfunction of thoracic region: Secondary | ICD-10-CM | POA: Diagnosis not present

## 2021-10-31 DIAGNOSIS — M9903 Segmental and somatic dysfunction of lumbar region: Secondary | ICD-10-CM | POA: Diagnosis not present

## 2021-10-31 DIAGNOSIS — M9905 Segmental and somatic dysfunction of pelvic region: Secondary | ICD-10-CM | POA: Diagnosis not present

## 2021-10-31 DIAGNOSIS — M5137 Other intervertebral disc degeneration, lumbosacral region: Secondary | ICD-10-CM | POA: Diagnosis not present

## 2021-10-31 NOTE — Progress Notes (Unsigned)
Office Visit Note  Patient: Hannah [name]             Date of Birth: 1946/05/11           MRN: [id number]             PCP: Glendale Chard, MD Referring: Glendale Chard, MD Visit Date: 11/14/2021 Occupation: @GUAROCC @  Subjective:  No chief complaint on file.   History of Present Illness: Hannah [name] is a 76 y.o. female ***   Activities of Daily Living:  Patient reports morning stiffness for *** {minute/hour:19697}.   Patient {ACTIONS;DENIES/REPORTS:21021675::"Denies"} nocturnal pain.  Difficulty dressing/grooming: {ACTIONS;DENIES/REPORTS:21021675::"Denies"} Difficulty climbing stairs: {ACTIONS;DENIES/REPORTS:21021675::"Denies"} Difficulty getting out of chair: {ACTIONS;DENIES/REPORTS:21021675::"Denies"} Difficulty using hands for taps, buttons, cutlery, and/or writing: {ACTIONS;DENIES/REPORTS:21021675::"Denies"}  No Rheumatology ROS completed.   PMFS History:  Patient Active Problem List   Diagnosis Date Noted   Chronic bilateral low back pain without sciatica 06/25/2021   Paresthesia and pain of extremity 06/25/2021   Atrial fibrillation ([place]) 04/16/2021   Demand ischemia ([place]) 03/26/2021   Unspecified atrial fibrillation ([place]) 03/26/2021   COVID-19 virus infection 03/26/2021   PVC (premature ventricular contraction) 05/06/2020   Type 2 diabetes mellitus with stage 3 chronic kidney disease, without long-term current use of insulin ([place]) 03/20/2020   Palpitations 03/20/2020   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/2020   Primary hypothyroidism 03/20/2020   Vitiligo 03/20/2020   Iron deficiency anemia 08/12/2017   Osteoporosis 10/02/2016   Systemic lupus erythematosus ([place]) 10/02/2016   Rheumatoid arthritis involving multiple sites with positive rheumatoid factor ([place]) 10/02/2016   High risk medication use 10/02/2016   History of chronic kidney disease 10/02/2016   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/2016   Primary osteoarthritis of  both knees 10/02/2016   Vitamin D deficiency 10/02/2016   History of diabetes mellitus 10/02/2016   History of hypertension 10/02/2016   History of asthma 10/02/2016   Absolute anemia    MGUS (monoclonal gammopathy of unknown significance)    Normocytic anemia 03/21/2015   Essential hypertension 03/20/2015   Abnormal CT of the chest 07/30/2012   Asthma 06/19/2012    Past Medical History:  Diagnosis Date   Anemia    Arthritis    Asthma    Atrial fibrillation ([place])    Diabetes mellitus without complication ([place])    Gout 12/17/2014   patient reported   Hyperlipidemia    Hypertension    Systemic lupus erythematosus ([place])    Vitiligo     Family History  Problem Relation Age of Onset   Heart disease Father    Diabetes Father    Hypertension Mother    Hypertension Sister    Hypertension Sister    Leukemia Sister    Past Surgical History:  Procedure Laterality Date   CATARACT EXTRACTION Bilateral 2015   DILATION AND CURETTAGE OF UTERUS     DOPPLER ECHOCARDIOGRAPHY  05/2018   Internist to review with pt; potential heart murmur 06/20/18   keratosis removal  2021   SKIN SURGERY  11/30/2018   left side of face   TOOTH EXTRACTION     Social History   Social History Narrative   Not on file   Immunization History  Administered Date(s) Administered   19-influenza Whole 07/15/2012   DTaP 06/18/2019   Fluad Quad(high Dose 65+) 06/19/2020, 06/03/2021   Influenza Split 06/28/2014   Influenza, High Dose Seasonal PF 05/18/2019   Influenza,inj,Quad PF,6+ Mos 06/20/2018   Moderna SARS-COV2 Booster Vaccination 01/01/2021   Moderna Sars-Covid-2  Vaccination 11/20/2019, 12/19/2019, 08/06/2020, 01/01/2021, 08/13/2021   Pneumococcal Conjugate-13 05/04/2018   Pneumococcal Polysaccharide-23 05/18/2019   Pneumococcal-Unspecified 06/28/2014   Tdap 06/15/2019   Zoster Recombinat (Shingrix) 02/19/2021, 04/24/2021     Objective: Vital Signs: There were no vitals taken for this visit.    Physical Exam   Musculoskeletal Exam: ***  CDAI Exam: CDAI Score: -- Patient Global: --; Provider Global: -- Swollen: --; Tender: -- Joint Exam 11/14/2021   No joint exam has been documented for this visit   There is currently no information documented on the homunculus. Go to the Rheumatology activity and complete the homunculus joint exam.  Investigation: No additional findings.  Imaging: No results found.  Recent Labs: Lab Results  Component Value Date   WBC 4.7 07/14/2021   HGB 10.2 (L) 07/14/2021   PLT 168 07/14/2021   NA 138 10/15/2021   K 4.6 10/15/2021   CL 100 10/15/2021   CO2 24 10/15/2021   GLUCOSE 93 10/15/2021   BUN 23 10/15/2021   CREATININE 1.39 (H) 10/15/2021   BILITOT 0.6 07/14/2021   ALKPHOS 54 07/14/2021   AST 22 07/14/2021   ALT 18 07/14/2021   PROT 6.5 07/14/2021   ALBUMIN 3.7 07/14/2021   CALCIUM 9.6 10/15/2021   GFRAA 44 (L) 12/19/2020    Speciality Comments: PLQ eye exam: 09/18/2021 WNL @ [facility] Follow up in 1 year  Procedures:  No procedures performed Allergies: Shellfish allergy and Shellfish-derived products   Assessment / Plan:     Visit Diagnoses: No diagnosis found.  Orders: No orders of the defined types were placed in this encounter.  No orders of the defined types were placed in this encounter.   Face-to-face time spent with patient was *** minutes. Greater than 50% of time was spent in counseling and coordination of care.  Follow-Up Instructions: No follow-ups on file.   Ofilia Neas, PA-C  Note - This record has been created using Dragon software.  Chart creation errors have been sought, but may not always  have been located. Such creation errors do not reflect on  the standard of medical care.

## 2021-11-03 DIAGNOSIS — D472 Monoclonal gammopathy: Secondary | ICD-10-CM | POA: Diagnosis not present

## 2021-11-03 DIAGNOSIS — M109 Gout, unspecified: Secondary | ICD-10-CM | POA: Diagnosis not present

## 2021-11-03 DIAGNOSIS — E1122 Type 2 diabetes mellitus with diabetic chronic kidney disease: Secondary | ICD-10-CM | POA: Diagnosis not present

## 2021-11-03 DIAGNOSIS — M329 Systemic lupus erythematosus, unspecified: Secondary | ICD-10-CM | POA: Diagnosis not present

## 2021-11-03 DIAGNOSIS — N189 Chronic kidney disease, unspecified: Secondary | ICD-10-CM | POA: Diagnosis not present

## 2021-11-03 DIAGNOSIS — N2581 Secondary hyperparathyroidism of renal origin: Secondary | ICD-10-CM | POA: Diagnosis not present

## 2021-11-03 DIAGNOSIS — N183 Chronic kidney disease, stage 3 unspecified: Secondary | ICD-10-CM | POA: Diagnosis not present

## 2021-11-03 DIAGNOSIS — D631 Anemia in chronic kidney disease: Secondary | ICD-10-CM | POA: Diagnosis not present

## 2021-11-03 DIAGNOSIS — I129 Hypertensive chronic kidney disease with stage 1 through stage 4 chronic kidney disease, or unspecified chronic kidney disease: Secondary | ICD-10-CM | POA: Diagnosis not present

## 2021-11-10 NOTE — Progress Notes (Signed)
Office Visit Note  Patient: Hannah [name]             Date of Birth: [birth year]           MRN: [id number]             PCP: Glendale Chard, MD Referring: Glendale Chard, MD Visit Date: 11/24/2021 Occupation: @GUAROCC @  Subjective:  Pain in multiple joints   History of Present Illness: Hannah [name] is a 76 y.o. female with history of seropositive rheumatoid arthritis, systemic lupus erythematous, and osteoporosis.  She is taking plaquenil 200 mg 1 tablet by mouth daily.  She is tolerating plaquenil without any side effects.  She has intermittent pain in both hands but denies any joint swelling.  She continues to perform hand exercises.  She has ongoing pain in her lower back and has been following up with Dr. Nelva Bush at [facility] and with her chiropractor twice a month.  She has had injections in her lower back which provided temporary relief.  She continues to have chronic pain in her right knee joint.  She is scheduled for a right knee replacement with Dr. Wynelle Link on 02/02/2022.  She notices intermittent joint swelling and buckling in her right knee.  She continues to use a cane to assist with ambulation. She denies any recent rashes, Raynaud's phenomenon, oral or nasal ulcerations, or increased hair loss.  She denies any shortness of breath, pleuritic chest pain, or palpitations.   Activities of Daily Living:  Patient reports morning stiffness for 1 hour.   Patient Denies nocturnal pain.  Difficulty dressing/grooming: Denies Difficulty climbing stairs: Reports Difficulty getting out of chair: Reports Difficulty using hands for taps, buttons, cutlery, and/or writing: Reports  Review of Systems  Constitutional:  Positive for fatigue.  HENT:  Negative for mouth sores, mouth dryness and nose dryness.   Eyes:  Positive for itching and dryness. Negative for pain.  Respiratory:  Negative for shortness of breath and difficulty breathing.   Cardiovascular:  Negative for chest  pain and palpitations.  Gastrointestinal:  Negative for blood in stool, constipation and diarrhea.  Endocrine: Negative for increased urination.  Genitourinary:  Negative for difficulty urinating.  Musculoskeletal:  Positive for joint pain, joint pain, joint swelling, myalgias, morning stiffness, muscle tenderness and myalgias.  Skin:  Negative for color change, rash and redness.  Allergic/Immunologic: Negative for susceptible to infections.  Neurological:  Negative for dizziness, numbness, headaches, memory loss and weakness.  Hematological:  Positive for bruising/bleeding tendency.  Psychiatric/Behavioral:  Negative for confusion.    PMFS History:  Patient Active Problem List   Diagnosis Date Noted   Chronic bilateral low back pain without sciatica 06/25/2021   Paresthesia and pain of extremity 06/25/2021   Atrial fibrillation ([place]) 04/16/2021   Demand ischemia ([place]) 03/26/2021   Unspecified atrial fibrillation ([place]) 03/26/2021   COVID-19 virus infection 03/26/2021   PVC (premature ventricular contraction) 05/06/2020   Type 2 diabetes mellitus with stage 3 chronic kidney disease, without long-term current use of insulin ([place]) 03/20/2020   Palpitations 03/20/2020   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/2020   Primary hypothyroidism 03/20/2020   Vitiligo 03/20/2020   Iron deficiency anemia 08/12/2017   Osteoporosis 10/02/2016   Systemic lupus erythematosus ([place]) 10/02/2016   Rheumatoid arthritis involving multiple sites with positive rheumatoid factor ([place]) 10/02/2016   High risk medication use 10/02/2016   History of chronic kidney disease 10/02/2016   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/2016   Primary osteoarthritis of both  knees 10/02/2016   Vitamin D deficiency 10/02/2016   History of diabetes mellitus 10/02/2016   History of hypertension 10/02/2016   History of asthma 10/02/2016   Absolute anemia    MGUS (monoclonal gammopathy of unknown significance)     Normocytic anemia 03/21/2015   Essential hypertension 03/20/2015   Abnormal CT of the chest 07/30/2012   Asthma 06/19/2012    Past Medical History:  Diagnosis Date   Anemia    Arthritis    Asthma    Atrial fibrillation ([place])    Diabetes mellitus without complication ([place])    Gout 12/17/2014   patient reported   Hyperlipidemia    Hypertension    Systemic lupus erythematosus ([place])    Vitiligo     Family History  Problem Relation Age of Onset   Heart disease Father    Diabetes Father    Hypertension Mother    Hypertension Sister    Hypertension Sister    Leukemia Sister    Past Surgical History:  Procedure Laterality Date   CATARACT EXTRACTION Bilateral 2015   DILATION AND CURETTAGE OF UTERUS     DOPPLER ECHOCARDIOGRAPHY  05/2018   Internist to review with pt; potential heart murmur 06/20/18   keratosis removal  2021   SKIN SURGERY  11/30/2018   left side of face   TOOTH EXTRACTION     Social History   Social History Narrative   Not on file   Immunization History  Administered Date(s) Administered   19-influenza Whole 07/15/2012   DTaP 06/18/2019   Fluad Quad(high Dose 65+) 06/19/2020, 06/03/2021   Influenza Split 06/28/2014   Influenza, High Dose Seasonal PF 05/18/2019   Influenza,inj,Quad PF,6+ Mos 06/20/2018   Moderna SARS-COV2 Booster Vaccination 01/01/2021   Moderna Sars-Covid-2 Vaccination 11/20/2019, 12/19/2019, 08/06/2020, 01/01/2021, 08/13/2021   Pneumococcal Conjugate-13 05/04/2018   Pneumococcal Polysaccharide-23 05/18/2019   Pneumococcal-Unspecified 06/28/2014   Tdap 06/15/2019   Zoster Recombinat (Shingrix) 02/19/2021, 04/24/2021     Objective: Vital Signs: BP (!) 145/82 (BP Location: Left Arm, Patient Position: Sitting, Cuff Size: Large)    Pulse 61    Ht 4' 9"  (1.448 m)    Wt 170 lb 3.2 oz (77.2 kg)    BMI 36.83 kg/m    Physical Exam Vitals and nursing note reviewed.  Constitutional:      Appearance: She is well-developed.  HENT:      Head: Normocephalic and atraumatic.  Eyes:     Conjunctiva/sclera: Conjunctivae normal.  Cardiovascular:     Rate and Rhythm: Normal rate and regular rhythm.     Heart sounds: Murmur heard.  Pulmonary:     Effort: Pulmonary effort is normal.     Breath sounds: Normal breath sounds.  Abdominal:     General: Bowel sounds are normal.     Palpations: Abdomen is soft.  Musculoskeletal:     Cervical back: Normal range of motion.  Lymphadenopathy:     Cervical: No cervical adenopathy.  Skin:    General: Skin is warm and dry.     Capillary Refill: Capillary refill takes less than 2 seconds.  Neurological:     Mental Status: She is alert and oriented to person, place, and time.  Psychiatric:        Behavior: Behavior normal.     Musculoskeletal Exam: C-spine has good range of motion with no discomfort at this time.  Shoulder joints, elbow joints, wrist joints, MCPs, PIPs, DIPs have good range of motion with no synovitis.  She has PIP  and DIP thickening of PIP and DIP joints with some subluxation.  No tenderness or synovitis of MCP joints.  Hip joints have good range of motion with no groin pain.  Painful range of motion with crepitus of the right knee.  Ankle joints have good range of motion with no tenderness or joint swelling.  No tenderness over MTP joints.  CDAI Exam: CDAI Score: 0.7  Patient Global: 5 mm; Provider Global: 2 mm Swollen: 0 ; Tender: 0  Joint Exam 11/24/2021   No joint exam has been documented for this visit   There is currently no information documented on the homunculus. Go to the Rheumatology activity and complete the homunculus joint exam.  Investigation: No additional findings.  Imaging: No results found.  Recent Labs: Lab Results  Component Value Date   WBC 4.7 07/14/2021   HGB 10.2 (L) 07/14/2021   PLT 168 07/14/2021   NA 138 10/15/2021   K 4.6 10/15/2021   CL 100 10/15/2021   CO2 24 10/15/2021   GLUCOSE 93 10/15/2021   BUN 23 10/15/2021    CREATININE 1.39 (H) 10/15/2021   BILITOT 0.6 07/14/2021   ALKPHOS 54 07/14/2021   AST 22 07/14/2021   ALT 18 07/14/2021   PROT 6.5 07/14/2021   ALBUMIN 3.7 07/14/2021   CALCIUM 9.6 10/15/2021   GFRAA 44 (L) 12/19/2020    Speciality Comments: PLQ eye exam: 09/18/2021 WNL @ [facility] Follow up in 1 year  Procedures:  No procedures performed Allergies: Shellfish allergy and Iodinated contrast media      Assessment / Plan:     Visit Diagnoses: Rheumatoid arthritis involving multiple sites with positive rheumatoid factor (HCC) - Positive RF, positive anti-CCP: She has no synovitis on examination today.  She has not had any signs or symptoms of a rheumatoid arthritis flare.  She experiences intermittent soreness and stiffness in both hands due to underlying osteoarthritis.  On examination today she does not have any tenderness or synovitis over her MCP joints.  Discussed the importance of joint protection and muscle strengthening.  Overall her rheumatoid arthritis is well controlled on Plaquenil 200 mg 1 tablet by mouth daily.  She remains on Plaquenil as prescribed.  She was advised to notify us if she develops signs or symptoms of a flare.  She will follow up in 5 months.   Other systemic lupus erythematosus with other organ involvement (HCC) - Positive ANA, positive Smith, positive RNP: She has not had any signs or symptoms of a systemic lupus flare.  She is clinically doing well taking Plaquenil 200 mg 1 tablet by mouth daily.  She continues to tolerate Plaquenil without any side effects and has not missed any doses recently.  She has not had any recent Malar rashes, signs of alopecia, oral or nasal ulcerations, mouth dryness, cervical lymphadenopathy, shortness of breath, pleuritic chest pain, or palpitations.  She has not noticed any increased photosensitivity but tries to avoid direct sun exposure. Lab work from 01/08/2021 was reviewed today in the office: Double-stranded ENA negative,  ESR within normal limits, and complements within normal limits.  The following lab work will be updated today.  She remain on Plaquenil as prescribed.  She was advised to notify us if she develops signs or symptoms of a flare. - Plan: CBC with Differential/Platelet, COMPLETE METABOLIC PANEL WITH GFR, Protein / creatinine ratio, urine, ANA, Anti-DNA antibody, double-stranded, C3 and C4, Sedimentation rate, VITAMIN D 25 Hydroxy (Vit-D Deficiency, Fractures)  High risk medication use -  Plaquenil 1 tablet by mouth daily.  PLQ eye exam: 09/18/2021 WNL @ [facility] Follow up in 1 year.  CBC and CMP were drawn today to monitor for drug toxicity.- Plan: CBC with Differential/Platelet, COMPLETE METABOLIC PANEL WITH GFR  Age-related osteoporosis without current pathological fracture - Last Prolia injection administered on 06/13/2021. DEXA updated on 08/09/20: Total right hip BMD 0.660 with T score -2.4.  Due to update DXA November 2023.  She continues to use a cane to assist with ambulation.  No recent fractures.   Vitamin D, CBC, and CMP updated today prior to her next prolia injection.   - Plan: COMPLETE METABOLIC PANEL WITH GFR, VITAMIN D 25 Hydroxy (Vit-D Deficiency, Fractures)  History of vitamin D deficiency -She is taking vitamin D 2000 units daily.  Vitamin D level will be checked today prior to scheduling her Prolia injection.  Plan: VITAMIN D 25 Hydroxy (Vit-D Deficiency, Fractures)  Idiopathic chronic gout of multiple sites without tophus -  Uric acid was 3.8 on 01/08/21.  She has not had any signs or symptoms of a gout flare.  She is taking allopurinol 100 mg daily for management of gout.  Primary osteoarthritis of both knees: She has chronic pain in both knee joints, right greater than left.  She is scheduled for a right knee total arthroplasty on 02/02/2022 by Dr. Wynelle Link.  She continues to use a cane to assist with ambulation.   Other medical conditions are listed as follows:  MGUS (monoclonal  gammopathy of unknown significance) - she is followed by Dr. Irene Limbo.  She is getting iron infusions for anemia.  History of hypertension: BP was 145/82 today in the office.   Chronic anticoagulation: She remains on Eliquis as prescribed.  Paroxysmal atrial fibrillation (HCC)  History of diabetes mellitus  History of asthma  History of anemia  History of chronic kidney disease  Orders: Orders Placed This Encounter  Procedures   CBC with Differential/Platelet   COMPLETE METABOLIC PANEL WITH GFR   Protein / creatinine ratio, urine   ANA   Anti-DNA antibody, double-stranded   C3 and C4   Sedimentation rate   VITAMIN D 25 Hydroxy (Vit-D Deficiency, Fractures)   No orders of the defined types were placed in this encounter.  Follow-Up Instructions: Return in about 5 months (around 04/23/2022) for Rheumatoid arthritis, Systemic lupus erythematosus, Osteoporosis.   Ofilia Neas, PA-C  Note - This record has been created using Dragon software.  Chart creation errors have been sought, but may not always  have been located. Such creation errors do not reflect on  the standard of medical care.

## 2021-11-11 ENCOUNTER — Telehealth: Payer: Self-pay

## 2021-11-11 NOTE — Chronic Care Management (AMB) (Signed)
Chronic Care Management Pharmacy Assistant   Name: Hannah [name]  MRN: [id number] DOB: Jan 09, 1946   Reason for Encounter: Disease State/ Hypertension  Recent office visits:  10-22-2021 Lynne Logan, RN (CCM)  10-15-2021 Glendale Chard, MD. Creatinine= 1.39, eGFR= 39. A1C= 5.8. EKG completed.  Recent consult visits:  10-14-2021 Nahser, Wonda Cheng, MD (Cardiology). No changes.   10-08-2021 Justice Britain, MD (Orthopedic surgery). Unable to view encounter.  10-01-2021 Dionisio David, MD (Chiropractic). Unable to view encounter.  Hospital visits:  None in previous 6 months  Medications: Outpatient Encounter Medications as of 11/11/2021  Medication Sig   Accu-Chek Softclix Lancets lancets Use to check blood sugars daily E11.69   albuterol (PROVENTIL HFA;VENTOLIN HFA) 108 (90 Base) MCG/ACT inhaler Inhale 2 puffs into the lungs every 6 (six) hours as needed for wheezing or shortness of breath.   Alcohol Swabs (ALCOHOL PADS) 70 % PADS Use as directed to check blood sugars 1 time per day dx: e11.22   allopurinol (ZYLOPRIM) 100 MG tablet TAKE 1 TABLET BY MOUTH EVERY DAY FOR GOUT   amLODipine (NORVASC) 2.5 MG tablet TAKE 1 TABLET(2.5 MG) BY MOUTH DAILY   apixaban (ELIQUIS) 5 MG TABS tablet Take 1 tablet (5 mg total) by mouth 2 (two) times daily.   Ascorbic Acid (VITAMIN C) 1000 MG tablet Take 1,000 mg by mouth daily.   atorvastatin (LIPITOR) 80 MG tablet TAKE 1 TABLET BY MOUTH EVERY DAY   budesonide-formoterol (SYMBICORT) 160-4.5 MCG/ACT inhaler INHALE 2 PUFFS BY MOUTH TWICE DAILY IN THE MORNING AND IN THE EVENING   Calcium Carb-Cholecalciferol 600-800 MG-UNIT TABS Take 1 tablet by mouth daily.    Cholecalciferol (VITAMIN D PO) Take 2,000 Units by mouth daily.   cyanocobalamin 1000 MCG tablet Take 1,000 mcg by mouth daily.   denosumab (PROLIA) 60 MG/ML SOSY injection Inject 60 mg into the skin every 6 (six) months. Appt on 06/07/21   glucose blood (ACCU-CHEK GUIDE) test strip Use to  check blood sugars daily E11.69   hydroxychloroquine (PLAQUENIL) 200 MG tablet TAKE 1 TABLET(200 MG) BY MOUTH DAILY   Hypromellose (ARTIFICIAL TEARS OP) Apply 1 drop to eye daily as needed (dry eyes).   irbesartan (AVAPRO) 300 MG tablet TAKE 1 TABLET(300 MG) BY MOUTH DAILY   iron polysaccharides (NIFEREX) 150 MG capsule Take 150 mg by mouth daily.   Liniments (BEN GAY EX) Apply 1 application topically daily as needed (pain).   metoprolol succinate (TOPROL-XL) 25 MG 24 hr tablet Take 1 tablet (25 mg total) by mouth daily.   SYNTHROID 88 MCG tablet TAKE 1 TABLET BY MOUTH EVERY DAY MONDAY TO SATURDAY AND OFF ON SUNDAYS   trolamine salicylate (ASPERCREME) 10 % cream Apply 1 application topically daily as needed for muscle pain.   No facility-administered encounter medications on file as of 11/11/2021.  Reviewed chart prior to disease state call. Spoke with patient regarding BP  Recent Office Vitals: BP Readings from Last 3 Encounters:  10/15/21 120/70  10/14/21 130/65  07/16/21 (!) 162/63   Pulse Readings from Last 3 Encounters:  10/15/21 72  10/14/21 74  07/16/21 65    Wt Readings from Last 3 Encounters:  10/15/21 164 lb 12.8 oz (74.8 kg)  10/14/21 166 lb 12.8 oz (75.7 kg)  07/16/21 163 lb 4.8 oz (74.1 kg)     Kidney Function Lab Results  Component Value Date/Time   CREATININE 1.39 (H) 10/15/2021 03:21 PM   CREATININE 1.13 (H) 07/14/2021 08:12 AM   CREATININE 1.07 (H)  05/23/2021 01:33 PM   CREATININE 1.10 (H) 04/16/2021 07:29 AM   CREATININE 1.37 (H) 12/19/2020 02:26 PM   CREATININE 1.4 (H) 08/12/2017 08:08 AM   CREATININE 1.2 (H) 02/16/2017 08:29 AM   GFRNONAA 51 (L) 07/14/2021 08:12 AM   GFRNONAA 38 (L) 12/19/2020 02:26 PM   GFRAA 44 (L) 12/19/2020 02:26 PM    BMP Latest Ref Rng & Units 10/15/2021 07/14/2021 05/23/2021  Glucose 70 - 99 mg/dL 93 99 70  BUN 8 - 27 mg/dL 23 23 16   Creatinine 0.57 - 1.00 mg/dL 1.39(H) 1.13(H) 1.07(H)  BUN/Creat Ratio 12 - 28 17 - 15  Sodium  134 - 144 mmol/L 138 143 141  Potassium 3.5 - 5.2 mmol/L 4.6 4.0 4.0  Chloride 96 - 106 mmol/L 100 107 105  CO2 20 - 29 mmol/L 24 26 31   Calcium 8.7 - 10.3 mg/dL 9.6 9.0 9.1    Current antihypertensive regimen:  Metoprolol succinate 25 mg daily Amlodipine 2.5 mg daily Irbersartan 300 mg daily  How often are you checking your Blood Pressure? 1-2x per week  Current home BP readings: 132/75  What recent interventions/DTPs have been made by any provider to improve Blood Pressure control since last CPP Visit:  Educated on Daily salt intake goal < 2300 mg; Importance of home blood pressure monitoring; Proper BP monitoring technique; -Counseled to monitor BP at home at least three times per week, document, and provide log at future appointments -Counseled on diet and exercise extensively  Any recent hospitalizations or ED visits since last visit with CPP? No  What diet changes have been made to improve Blood Pressure Control?  Patient states she's been eating vegetables/ fruit and drinking water.  What exercise is being done to improve your Blood Pressure Control?  Patient states she goes to the [facility] 3 days out the week and yoga twice weekly.  Adherence Review: Is the patient currently on ACE/ARB medication? Yes Does the patient have >5 day gap between last estimated fill dates? Yes  Care Gaps: Covid booster overdue last completed 01-01-2021  AWV 07-29-2022 Foot exam overdue  Star Rating Drugs: Atorvastatin 80 mg- Last filled 10-08-2021 90 DS [facility] Irbesartan 300 mg- Last filled 09-29-2021 90 DS [place] Clinical Pharmacist Assistant [phone]

## 2021-11-12 ENCOUNTER — Ambulatory Visit: Payer: Medicare HMO | Admitting: Obstetrics & Gynecology

## 2021-11-13 DIAGNOSIS — M9903 Segmental and somatic dysfunction of lumbar region: Secondary | ICD-10-CM | POA: Diagnosis not present

## 2021-11-13 DIAGNOSIS — M5136 Other intervertebral disc degeneration, lumbar region: Secondary | ICD-10-CM | POA: Diagnosis not present

## 2021-11-13 DIAGNOSIS — M5137 Other intervertebral disc degeneration, lumbosacral region: Secondary | ICD-10-CM | POA: Diagnosis not present

## 2021-11-13 DIAGNOSIS — M9902 Segmental and somatic dysfunction of thoracic region: Secondary | ICD-10-CM | POA: Diagnosis not present

## 2021-11-13 DIAGNOSIS — M47814 Spondylosis without myelopathy or radiculopathy, thoracic region: Secondary | ICD-10-CM | POA: Diagnosis not present

## 2021-11-13 DIAGNOSIS — M9905 Segmental and somatic dysfunction of pelvic region: Secondary | ICD-10-CM | POA: Diagnosis not present

## 2021-11-14 ENCOUNTER — Ambulatory Visit: Payer: Medicare HMO | Admitting: Physician Assistant

## 2021-11-14 DIAGNOSIS — M3219 Other organ or system involvement in systemic lupus erythematosus: Secondary | ICD-10-CM

## 2021-11-14 DIAGNOSIS — M1A09X0 Idiopathic chronic gout, multiple sites, without tophus (tophi): Secondary | ICD-10-CM

## 2021-11-14 DIAGNOSIS — Z7901 Long term (current) use of anticoagulants: Secondary | ICD-10-CM

## 2021-11-14 DIAGNOSIS — D472 Monoclonal gammopathy: Secondary | ICD-10-CM

## 2021-11-14 DIAGNOSIS — M810 Age-related osteoporosis without current pathological fracture: Secondary | ICD-10-CM

## 2021-11-14 DIAGNOSIS — Z8639 Personal history of other endocrine, nutritional and metabolic disease: Secondary | ICD-10-CM

## 2021-11-14 DIAGNOSIS — Z862 Personal history of diseases of the blood and blood-forming organs and certain disorders involving the immune mechanism: Secondary | ICD-10-CM

## 2021-11-14 DIAGNOSIS — I480 Paroxysmal atrial fibrillation: Secondary | ICD-10-CM

## 2021-11-14 DIAGNOSIS — M170 Bilateral primary osteoarthritis of knee: Secondary | ICD-10-CM

## 2021-11-14 DIAGNOSIS — Z8709 Personal history of other diseases of the respiratory system: Secondary | ICD-10-CM

## 2021-11-14 DIAGNOSIS — Z87448 Personal history of other diseases of urinary system: Secondary | ICD-10-CM

## 2021-11-14 DIAGNOSIS — Z8679 Personal history of other diseases of the circulatory system: Secondary | ICD-10-CM

## 2021-11-14 DIAGNOSIS — Z79899 Other long term (current) drug therapy: Secondary | ICD-10-CM

## 2021-11-14 DIAGNOSIS — M0579 Rheumatoid arthritis with rheumatoid factor of multiple sites without organ or systems involvement: Secondary | ICD-10-CM

## 2021-11-20 ENCOUNTER — Other Ambulatory Visit: Payer: Self-pay | Admitting: Internal Medicine

## 2021-11-21 ENCOUNTER — Other Ambulatory Visit: Payer: Self-pay

## 2021-11-21 ENCOUNTER — Encounter: Payer: Self-pay | Admitting: Obstetrics & Gynecology

## 2021-11-21 ENCOUNTER — Ambulatory Visit (INDEPENDENT_AMBULATORY_CARE_PROVIDER_SITE_OTHER): Payer: Medicare HMO | Admitting: Obstetrics & Gynecology

## 2021-11-21 VITALS — BP 142/90 | HR 70 | Resp 16 | Ht <= 58 in | Wt 171.0 lb

## 2021-11-21 DIAGNOSIS — Z9189 Other specified personal risk factors, not elsewhere classified: Secondary | ICD-10-CM

## 2021-11-21 DIAGNOSIS — Z6836 Body mass index (BMI) 36.0-36.9, adult: Secondary | ICD-10-CM

## 2021-11-21 DIAGNOSIS — Z780 Asymptomatic menopausal state: Secondary | ICD-10-CM

## 2021-11-21 DIAGNOSIS — M810 Age-related osteoporosis without current pathological fracture: Secondary | ICD-10-CM

## 2021-11-21 DIAGNOSIS — Z01419 Encounter for gynecological examination (general) (routine) without abnormal findings: Secondary | ICD-10-CM

## 2021-11-21 NOTE — Progress Notes (Signed)
Hannah [name] 05-26-1946 [id number]   History:    76 y.o.  G1P1L0  Single.  Very active with her sister at [facility].   RP:  Established patient presenting for annual gyn exam    HPI:  Postmenopause. No HRT.  No PMB.  No pelvic pain. Abstinent.  Pap Neg 08/2019.  Breasts wnl. Mammo 02/2021 Neg. Urine/BMs wnl.  BMI increased x last year to 36.05.  Joined weight watcher.  Exercising regularly at the [facility] Scheduled for a Rt knee replacement in 01/2022. Chronic Anemia, seen by Hemato, taking PO Iron. Dermato followed for Vitiligo.  Fam MD Dr Baird Cancer for DM, cHTN, Asthma.  On Eliquis for AFib.  BMD at [facility] 08-09-20 showing Osteopenia with a T-Score at -2.4.  Repeat BD at [facility] 07/2022. COLONOSCOPY: 2014  Past medical history,surgical history, family history and social history were all reviewed and documented in the EPIC chart.  Gynecologic History No LMP recorded. Patient is postmenopausal.  Obstetric History OB History  Gravida Para Term Preterm AB Living  1 1       0  SAB IAB Ectopic Multiple Live Births               # Outcome Date GA Lbr Len/2nd Weight Sex Delivery Anes PTL Lv  1 Para             Obstetric Comments  Baby died from CHF     ROS: A ROS was performed and pertinent positives and negatives are included in the history.  GENERAL: No fevers or chills. HEENT: No change in vision, no earache, sore throat or sinus congestion. NECK: No pain or stiffness. CARDIOVASCULAR: No chest pain or pressure. No palpitations. PULMONARY: No shortness of breath, cough or wheeze. GASTROINTESTINAL: No abdominal pain, nausea, vomiting or diarrhea, melena or bright red blood per rectum. GENITOURINARY: No urinary frequency, urgency, hesitancy or dysuria. MUSCULOSKELETAL: No joint or muscle pain, no back pain, no recent trauma. DERMATOLOGIC: No rash, no itching, no lesions. ENDOCRINE: No polyuria, polydipsia, no heat or cold intolerance. No recent change in weight. HEMATOLOGICAL: No anemia or easy  bruising or bleeding. NEUROLOGIC: No headache, seizures, numbness, tingling or weakness. PSYCHIATRIC: No depression, no loss of interest in normal activity or change in sleep pattern.     Exam:   BP (!) 142/90    Pulse 70    Resp 16    Ht 4' 9.75" (1.467 m)    Wt 171 lb (77.6 kg)    BMI 36.05 kg/m   Body mass index is 36.05 kg/m.  General appearance : Well developed well nourished female. No acute distress HEENT: Eyes: no retinal hemorrhage or exudates,  Neck supple, trachea midline, no carotid bruits, no thyroidmegaly Lungs: Clear to auscultation, no rhonchi or wheezes, or rib retractions  Heart: Regular rate and rhythm, no murmurs or gallops Breast:Examined in sitting and supine position were symmetrical in appearance, no palpable masses or tenderness,  no skin retraction, no nipple inversion, no nipple discharge, no skin discoloration, no axillary or supraclavicular lymphadenopathy Abdomen: no palpable masses or tenderness, no rebound or guarding Extremities: no edema or skin discoloration or tenderness  Pelvic: Vulva: Normal             Vagina: No gross lesions or discharge  Cervix: No gross lesions or discharge  Uterus  AV, normal size, shape and consistency, non-tender and mobile  Adnexa  Without masses or tenderness  Anus: Normal   Assessment/Plan:  76 y.o. female for annual  exam   1. Well female exam with routine gynecological exam Postmenopause. No HRT.  No PMB.  No pelvic pain. Abstinent.  Pap Neg 08/2019.  Breasts wnl. Mammo 02/2021 Neg. Urine/BMs wnl.  BMI increased x last year to 36.05.  Joined weight watcher. Exercising regularly at the [facility]. Scheduled for a Rt knee replacement in 01/2022. Chronic Anemia, seen by Hemato, taking PO Iron. Dermato followed for Vitiligo.  Fam MD Dr Baird Cancer for DM, cHTN, Asthma.  On Eliquis for AFib.  BMD at [facility] 08-09-20 showing Osteopenia with a T-Score at -2.4.  Repeat BD at [facility] 07/2022. COLONOSCOPY: 2014  2. At risk of fracture due to  osteoporosis  3. Postmenopausal Postmenopause. No HRT.  No PMB.  No pelvic pain. Abstinent.    4. Age-related osteoporosis without current pathological fracture Last BD T-Score -2.4 in 07/2020 at [facility].  Repeat BD in 07/2022.  5. Class 2 severe obesity due to excess calories with serious comorbidity and body mass index (BMI) of 36.0 to 36.9 in adult [facility]) Lower Calorie/Carb with [employer].  Continue [employer].  Recommend to do specific exercises with trainer to improve the strength of her muscles around the knees prior to knee replacement in 01/2022.  6. Other specified personal risk factors, not elsewhere classified  Other orders - valsartan-hydrochlorothiazide (DIOVAN-HCT) 160-25 MG tablet   Princess Bruins MD, 8:48 AM 11/21/2021

## 2021-11-24 ENCOUNTER — Encounter: Payer: Self-pay | Admitting: Physician Assistant

## 2021-11-24 ENCOUNTER — Other Ambulatory Visit (HOSPITAL_COMMUNITY): Payer: Self-pay

## 2021-11-24 ENCOUNTER — Telehealth: Payer: Self-pay | Admitting: Pharmacist

## 2021-11-24 ENCOUNTER — Telehealth: Payer: Self-pay

## 2021-11-24 ENCOUNTER — Ambulatory Visit: Payer: Medicare HMO | Admitting: Physician Assistant

## 2021-11-24 ENCOUNTER — Other Ambulatory Visit: Payer: Self-pay

## 2021-11-24 VITALS — BP 145/82 | HR 61 | Ht <= 58 in | Wt 170.2 lb

## 2021-11-24 DIAGNOSIS — Z79899 Other long term (current) drug therapy: Secondary | ICD-10-CM | POA: Diagnosis not present

## 2021-11-24 DIAGNOSIS — M3219 Other organ or system involvement in systemic lupus erythematosus: Secondary | ICD-10-CM | POA: Diagnosis not present

## 2021-11-24 DIAGNOSIS — M1A09X0 Idiopathic chronic gout, multiple sites, without tophus (tophi): Secondary | ICD-10-CM

## 2021-11-24 DIAGNOSIS — Z8679 Personal history of other diseases of the circulatory system: Secondary | ICD-10-CM

## 2021-11-24 DIAGNOSIS — Z8639 Personal history of other endocrine, nutritional and metabolic disease: Secondary | ICD-10-CM | POA: Diagnosis not present

## 2021-11-24 DIAGNOSIS — M0579 Rheumatoid arthritis with rheumatoid factor of multiple sites without organ or systems involvement: Secondary | ICD-10-CM | POA: Diagnosis not present

## 2021-11-24 DIAGNOSIS — Z862 Personal history of diseases of the blood and blood-forming organs and certain disorders involving the immune mechanism: Secondary | ICD-10-CM

## 2021-11-24 DIAGNOSIS — Z8709 Personal history of other diseases of the respiratory system: Secondary | ICD-10-CM

## 2021-11-24 DIAGNOSIS — M810 Age-related osteoporosis without current pathological fracture: Secondary | ICD-10-CM

## 2021-11-24 DIAGNOSIS — Z7901 Long term (current) use of anticoagulants: Secondary | ICD-10-CM

## 2021-11-24 DIAGNOSIS — M170 Bilateral primary osteoarthritis of knee: Secondary | ICD-10-CM

## 2021-11-24 DIAGNOSIS — I480 Paroxysmal atrial fibrillation: Secondary | ICD-10-CM

## 2021-11-24 DIAGNOSIS — D472 Monoclonal gammopathy: Secondary | ICD-10-CM | POA: Diagnosis not present

## 2021-11-24 DIAGNOSIS — Z87448 Personal history of other diseases of urinary system: Secondary | ICD-10-CM

## 2021-11-24 NOTE — Telephone Encounter (Signed)
Patient called stating she wrote down her credit card information for [facility] to order her Prolia medication and forgot to ask for a receipt.  Patient also wanted to confirm that [facility] shredded her credit card information.  Patient requested a return call.  Patient states you can leave a voicemail if she doesn't answer.

## 2021-11-24 NOTE — Telephone Encounter (Signed)
Patient due for Prolia on or after 12/10/21. Prolia labs drawn at [place] today.  Spoke with patient at [place] and advised that no osteoporosis grants are currently open. She was concerned about cost - copay through pharmacy benefit is $95. She is amenable to paying this. Rx for Prolia will be sent once labs from today result and are wnl to proceed.  Credit card information collected and will provide to [facility] once rx is sent  Knox Saliva, PharmD, MPH, BCPS Clinical Pharmacist (Rheumatology and Pulmonology)

## 2021-11-25 ENCOUNTER — Other Ambulatory Visit (HOSPITAL_COMMUNITY): Payer: Self-pay

## 2021-11-25 MED ORDER — DENOSUMAB 60 MG/ML ~~LOC~~ SOSY
60.0000 mg | PREFILLED_SYRINGE | SUBCUTANEOUS | 0 refills | Status: DC
  Filled 2021-11-25 – 2021-11-28 (×2): qty 1, 180d supply, fill #0

## 2021-11-25 NOTE — Telephone Encounter (Signed)
Vitamin D, CMP, and CBC stable to receive Prolia on or after 12/10/21. Patient scheduled for Prolia appt on 12/10/21. Will provide patient wilk  Rx for Prolia sent to [facility] with note to courier to clinic before 12/10/21 Payment information provided to Tamra, CPhT, at [facility].  Knox Saliva, PharmD, MPH, BCPS Clinical Pharmacist (Rheumatology and Pulmonology)

## 2021-11-25 NOTE — Telephone Encounter (Signed)
Returned call to patient. Advised that I will provide receipt at Prolia appt. Advised the CC information has been shredded.  Knox Saliva, PharmD, MPH, BCPS Clinical Pharmacist (Rheumatology and Pulmonology)

## 2021-11-25 NOTE — Progress Notes (Signed)
Anemia is stable. Creatinine is borderline elevated but has improved.  GFR is low but has improved.   ESR and complements  WNL.   Vitamin D WNL.  Protein creatinine ratio is elevated. Please clarify if she has seen a nephrologist in the past?

## 2021-11-27 ENCOUNTER — Other Ambulatory Visit: Payer: Self-pay | Admitting: *Deleted

## 2021-11-27 DIAGNOSIS — M3219 Other organ or system involvement in systemic lupus erythematosus: Secondary | ICD-10-CM

## 2021-11-27 DIAGNOSIS — M9902 Segmental and somatic dysfunction of thoracic region: Secondary | ICD-10-CM | POA: Diagnosis not present

## 2021-11-27 DIAGNOSIS — Z7901 Long term (current) use of anticoagulants: Secondary | ICD-10-CM

## 2021-11-27 DIAGNOSIS — M810 Age-related osteoporosis without current pathological fracture: Secondary | ICD-10-CM

## 2021-11-27 DIAGNOSIS — M9905 Segmental and somatic dysfunction of pelvic region: Secondary | ICD-10-CM | POA: Diagnosis not present

## 2021-11-27 DIAGNOSIS — Z79899 Other long term (current) drug therapy: Secondary | ICD-10-CM

## 2021-11-27 DIAGNOSIS — M9903 Segmental and somatic dysfunction of lumbar region: Secondary | ICD-10-CM | POA: Diagnosis not present

## 2021-11-27 DIAGNOSIS — M47814 Spondylosis without myelopathy or radiculopathy, thoracic region: Secondary | ICD-10-CM | POA: Diagnosis not present

## 2021-11-27 DIAGNOSIS — M170 Bilateral primary osteoarthritis of knee: Secondary | ICD-10-CM

## 2021-11-27 DIAGNOSIS — M0579 Rheumatoid arthritis with rheumatoid factor of multiple sites without organ or systems involvement: Secondary | ICD-10-CM

## 2021-11-27 DIAGNOSIS — M5136 Other intervertebral disc degeneration, lumbar region: Secondary | ICD-10-CM | POA: Diagnosis not present

## 2021-11-27 DIAGNOSIS — M5137 Other intervertebral disc degeneration, lumbosacral region: Secondary | ICD-10-CM | POA: Diagnosis not present

## 2021-11-27 DIAGNOSIS — Z8639 Personal history of other endocrine, nutritional and metabolic disease: Secondary | ICD-10-CM

## 2021-11-27 DIAGNOSIS — M1A09X0 Idiopathic chronic gout, multiple sites, without tophus (tophi): Secondary | ICD-10-CM

## 2021-11-27 LAB — COMPLETE METABOLIC PANEL WITH GFR
AG Ratio: 1.5 (calc) (ref 1.0–2.5)
ALT: 11 U/L (ref 6–29)
AST: 18 U/L (ref 10–35)
Albumin: 4.1 g/dL (ref 3.6–5.1)
Alkaline phosphatase (APISO): 45 U/L (ref 37–153)
BUN/Creatinine Ratio: 22 (calc) (ref 6–22)
BUN: 24 mg/dL (ref 7–25)
CO2: 25 mmol/L (ref 20–32)
Calcium: 9.7 mg/dL (ref 8.6–10.4)
Chloride: 105 mmol/L (ref 98–110)
Creat: 1.07 mg/dL — ABNORMAL HIGH (ref 0.60–1.00)
Globulin: 2.7 g/dL (calc) (ref 1.9–3.7)
Glucose, Bld: 88 mg/dL (ref 65–99)
Potassium: 4 mmol/L (ref 3.5–5.3)
Sodium: 140 mmol/L (ref 135–146)
Total Bilirubin: 0.5 mg/dL (ref 0.2–1.2)
Total Protein: 6.8 g/dL (ref 6.1–8.1)
eGFR: 54 mL/min/{1.73_m2} — ABNORMAL LOW (ref >=60)

## 2021-11-27 LAB — CBC WITH DIFFERENTIAL/PLATELET
Absolute Monocytes: 451 cells/uL (ref 200–950)
Basophils Absolute: 52 cells/uL (ref 0–200)
Basophils Relative: 1.1 %
Eosinophils Absolute: 432 cells/uL (ref 15–500)
Eosinophils Relative: 9.2 %
HCT: 33.3 % — ABNORMAL LOW (ref 35.0–45.0)
Hemoglobin: 10.7 g/dL — ABNORMAL LOW (ref 11.7–15.5)
Lymphs Abs: 935 cells/uL (ref 850–3900)
MCH: 31.6 pg (ref 27.0–33.0)
MCHC: 32.1 g/dL (ref 32.0–36.0)
MCV: 98.2 fL (ref 80.0–100.0)
MPV: 12.2 fL (ref 7.5–12.5)
Monocytes Relative: 9.6 %
Neutro Abs: 2829 cells/uL (ref 1500–7800)
Neutrophils Relative %: 60.2 %
Platelets: 200 10*3/uL (ref 140–400)
RBC: 3.39 10*6/uL — ABNORMAL LOW (ref 3.80–5.10)
RDW: 13.9 % (ref 11.0–15.0)
Total Lymphocyte: 19.9 %
WBC: 4.7 10*3/uL (ref 3.8–10.8)

## 2021-11-27 LAB — C3 AND C4
C3 Complement: 119 mg/dL (ref 83–193)
C4 Complement: 38 mg/dL (ref 15–57)

## 2021-11-27 LAB — ANTI-DNA ANTIBODY, DOUBLE-STRANDED: ds DNA Ab: 16 IU/mL — ABNORMAL HIGH

## 2021-11-27 LAB — ANA: Anti Nuclear Antibody (ANA): POSITIVE — AB

## 2021-11-27 LAB — ANTI-NUCLEAR AB-TITER (ANA TITER)
ANA TITER: 1:80 {titer} — ABNORMAL HIGH
ANA Titer 1: 1:1280 {titer} — ABNORMAL HIGH

## 2021-11-27 LAB — PROTEIN / CREATININE RATIO, URINE
Creatinine, Urine: 151 mg/dL (ref 20–275)
Protein/Creat Ratio: 298 mg/g creat — ABNORMAL HIGH (ref 24–184)
Protein/Creatinine Ratio: 0.298 mg/mg creat — ABNORMAL HIGH (ref 0.024–0.184)
Total Protein, Urine: 45 mg/dL — ABNORMAL HIGH (ref 5–24)

## 2021-11-27 LAB — SEDIMENTATION RATE: Sed Rate: 9 mm/h (ref 0–30)

## 2021-11-27 LAB — VITAMIN D 25 HYDROXY (VIT D DEFICIENCY, FRACTURES): Vit D, 25-Hydroxy: 42 ng/mL (ref 30–100)

## 2021-11-27 NOTE — Progress Notes (Signed)
ANA remains positive.  dsDNA is a low positive-16.  Complements and ESR are WNL which is reassuring. If she develop signs or symptoms of a flare please advise the patient to notify us. Recommend repeating lab work in 3 months.

## 2021-11-28 ENCOUNTER — Other Ambulatory Visit (HOSPITAL_COMMUNITY): Payer: Self-pay

## 2021-12-05 ENCOUNTER — Other Ambulatory Visit (HOSPITAL_COMMUNITY): Payer: Self-pay

## 2021-12-05 ENCOUNTER — Encounter: Payer: Self-pay | Admitting: Hematology

## 2021-12-08 NOTE — Telephone Encounter (Signed)
Prolia received in the office and placed in the fridge. Thanks!  ?

## 2021-12-10 ENCOUNTER — Other Ambulatory Visit: Payer: Self-pay

## 2021-12-10 ENCOUNTER — Ambulatory Visit (INDEPENDENT_AMBULATORY_CARE_PROVIDER_SITE_OTHER): Payer: Medicare HMO | Admitting: Pharmacist

## 2021-12-10 VITALS — BP 144/81 | HR 67

## 2021-12-10 DIAGNOSIS — Z79899 Other long term (current) drug therapy: Secondary | ICD-10-CM | POA: Diagnosis not present

## 2021-12-10 DIAGNOSIS — M810 Age-related osteoporosis without current pathological fracture: Secondary | ICD-10-CM

## 2021-12-10 MED ORDER — DENOSUMAB 60 MG/ML ~~LOC~~ SOSY
60.0000 mg | PREFILLED_SYRINGE | Freq: Once | SUBCUTANEOUS | Status: AC
  Administered 2021-12-10: 60 mg via SUBCUTANEOUS
  Filled 2021-12-10: qty 1

## 2021-12-10 NOTE — Progress Notes (Signed)
Pharmacy Note ? ?Subjective:   ?Patient presents to clinic today to receive bi-annual dose of Prolia. Her last Prolia dose was on 06/13/21 ? ?Patient running a fever or have signs/symptoms of infection? No ? ?Patient currently on antibiotics for the treatment of infection? No ? ?Patient had fall in the last 6 months?  Yes  If yes, did it require medical attention? No . She states she fell at home in the morning when she was rising from bed. Has bruised right knee but did not require medical attention ? ?Patient taking calcium 1200 mg daily through diet or supplement and at least 800 units vitamin D? Yes ? ?Objective: ?CMP  ?   ?Component Value Date/Time  ? NA 140 11/24/2021 1029  ? NA 138 10/15/2021 1521  ? NA 142 08/12/2017 0808  ? K 4.0 11/24/2021 1029  ? K 3.7 08/12/2017 0808  ? CL 105 11/24/2021 1029  ? CO2 25 11/24/2021 1029  ? CO2 27 08/12/2017 0808  ? GLUCOSE 88 11/24/2021 1029  ? GLUCOSE 85 08/12/2017 0808  ? BUN 24 11/24/2021 1029  ? BUN 23 10/15/2021 1521  ? BUN 20.8 08/12/2017 0808  ? CREATININE 1.07 (H) 11/24/2021 1029  ? CREATININE 1.4 (H) 08/12/2017 6270  ? CALCIUM 9.7 11/24/2021 1029  ? CALCIUM 10.3 08/12/2017 0808  ? PROT 6.8 11/24/2021 1029  ? PROT 7.1 12/30/2020 1254  ? PROT 6.8 08/12/2017 0808  ? ALBUMIN 3.7 07/14/2021 0812  ? ALBUMIN 4.5 06/19/2020 1429  ? ALBUMIN 3.7 08/12/2017 0808  ? AST 18 11/24/2021 1029  ? AST 22 07/14/2021 0812  ? AST 31 08/12/2017 0808  ? ALT 11 11/24/2021 1029  ? ALT 18 07/14/2021 0812  ? ALT 38 08/12/2017 0808  ? ALKPHOS 54 07/14/2021 0812  ? ALKPHOS 51 08/12/2017 0808  ? BILITOT 0.5 11/24/2021 1029  ? BILITOT 0.6 07/14/2021 0812  ? BILITOT 0.45 08/12/2017 0808  ? GFRNONAA 51 (L) 07/14/2021 3500  ? GFRNONAA 38 (L) 12/19/2020 1426  ? GFRAA 44 (L) 12/19/2020 1426  ? ? ?CBC ?   ?Component Value Date/Time  ? WBC 4.7 11/24/2021 1029  ? RBC 3.39 (L) 11/24/2021 1029  ? HGB 10.7 (L) 11/24/2021 1029  ? HGB 10.9 (L) 04/08/2021 1629  ? HGB 9.9 (L) 08/12/2017 9381  ? HCT 33.3 (L)  11/24/2021 1029  ? HCT 32.8 (L) 04/08/2021 1629  ? HCT 30.7 (L) 08/12/2017 8299  ? PLT 200 11/24/2021 1029  ? PLT 245 04/08/2021 1629  ? MCV 98.2 11/24/2021 1029  ? MCV 95 04/08/2021 1629  ? MCV 97.5 08/12/2017 0808  ? MCH 31.6 11/24/2021 1029  ? MCHC 32.1 11/24/2021 1029  ? RDW 13.9 11/24/2021 1029  ? RDW 14.2 04/08/2021 1629  ? RDW 14.0 08/12/2017 0808  ? LYMPHSABS 935 11/24/2021 1029  ? LYMPHSABS 0.8 04/08/2021 1629  ? LYMPHSABS 0.9 08/12/2017 0808  ? MONOABS 0.4 07/14/2021 0812  ? MONOABS 0.3 08/12/2017 0808  ? EOSABS 432 11/24/2021 1029  ? EOSABS 0.2 04/08/2021 1629  ? BASOSABS 52 11/24/2021 1029  ? BASOSABS 0.0 04/08/2021 1629  ? BASOSABS 0.0 08/12/2017 0808  ? ? ?Lab Results  ?Component Value Date  ? VD25OH 42 11/24/2021  ? ? ?T-score: DEXA updated on 08/09/20: Total right hip BMD 0.660 with T score -2.4.  Due to update DXA November 2023 ? ?Assessment/Plan:  ? ?Patient tolerated injection  without issue.  ? ?Administrations This Visit   ? ? denosumab (PROLIA) injection 60 mg   ? ?  Admin Date ?12/10/2021 Action ?Given Dose ?60 mg Route ?Subcutaneous Administered By ?Cassandria Anger, RPH  ? ?  ?  ? ?  ? ? ?F/u with Dr. Estanislado Pandy on 04/23/22  ? ?All questions encouraged and answered.  Instructed patient to call with any further questions or concerns. ?  ?Knox Saliva, PharmD, MPH, BCPS ?Clinical Pharmacist (Rheumatology and Pulmonology) ?

## 2021-12-11 DIAGNOSIS — M9905 Segmental and somatic dysfunction of pelvic region: Secondary | ICD-10-CM | POA: Diagnosis not present

## 2021-12-11 DIAGNOSIS — M9902 Segmental and somatic dysfunction of thoracic region: Secondary | ICD-10-CM | POA: Diagnosis not present

## 2021-12-11 DIAGNOSIS — M5136 Other intervertebral disc degeneration, lumbar region: Secondary | ICD-10-CM | POA: Diagnosis not present

## 2021-12-11 DIAGNOSIS — M5137 Other intervertebral disc degeneration, lumbosacral region: Secondary | ICD-10-CM | POA: Diagnosis not present

## 2021-12-11 DIAGNOSIS — M9903 Segmental and somatic dysfunction of lumbar region: Secondary | ICD-10-CM | POA: Diagnosis not present

## 2021-12-11 DIAGNOSIS — M47814 Spondylosis without myelopathy or radiculopathy, thoracic region: Secondary | ICD-10-CM | POA: Diagnosis not present

## 2021-12-18 ENCOUNTER — Telehealth: Payer: Self-pay | Admitting: Cardiovascular Disease

## 2021-12-18 MED ORDER — APIXABAN 5 MG PO TABS: 5.0000 mg | ORAL_TABLET | Freq: Two times a day (BID) | ORAL | 1 refills | Status: DC

## 2021-12-18 NOTE — Telephone Encounter (Signed)
Prescription refill request for Eliquis received. ?Indication: Afib  ?Last office visit: 10/14/21 ([place]) ?Scr: 1.07 (11/24/21) ?Age: 76 ?Weight: 77.2kg ? ?Appropriate dose and refill sent to requested pharmacy.  ?

## 2021-12-18 NOTE — Telephone Encounter (Signed)
?*  STAT* If patient is at the pharmacy, call can be transferred to refill team. ? ? ?1. Which medications need to be refilled? (please list name of each medication and dose if known) apixaban (ELIQUIS) 5 MG TABS tablet ? ?2. Which pharmacy/location (including street and city if local pharmacy) is medication to be sent to? needs to be sent to [facility] faxed to [phone] ? ?3. Do they need a 30 day or 90 day supply? 90 day  ?

## 2021-12-19 ENCOUNTER — Other Ambulatory Visit: Payer: Self-pay | Admitting: Cardiovascular Disease

## 2021-12-19 DIAGNOSIS — M5459 Other low back pain: Secondary | ICD-10-CM | POA: Diagnosis not present

## 2021-12-19 DIAGNOSIS — M47816 Spondylosis without myelopathy or radiculopathy, lumbar region: Secondary | ICD-10-CM | POA: Diagnosis not present

## 2021-12-19 NOTE — Telephone Encounter (Signed)
Rx was on print, please resend. Thanks ?

## 2021-12-19 NOTE — Telephone Encounter (Signed)
[place] and spoke with [employer], Pharmacist. Provided verbal order for Eliquis refill. For future refills and concerns with Roosvelt Harps, call [phone].  ?

## 2021-12-19 NOTE — Telephone Encounter (Signed)
Follow Up: ? ? ?Hannah [name] called, he said Prescription for Eliquis was received but not signed. He said that he will need a verbal statement saying it is alright to refill. ?

## 2021-12-23 ENCOUNTER — Other Ambulatory Visit: Payer: Self-pay | Admitting: Physician Assistant

## 2021-12-23 DIAGNOSIS — M0579 Rheumatoid arthritis with rheumatoid factor of multiple sites without organ or systems involvement: Secondary | ICD-10-CM

## 2021-12-23 NOTE — Telephone Encounter (Signed)
Next Visit: 04/23/2022 ? ?Last Visit: 11/24/2021 ? ?Labs: 11/24/2021 Anemia is stable. Creatinine is borderline elevated but has improved.  GFR is low but has improved.   ? ?Eye exam: 09/18/2021 WNL  ? ?Current Dose per office note 11/24/2021: Plaquenil 200 mg 1 tablet by mouth daily ? 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 ?Last Fill: 09/30/2021 ? ?Okay to refill Plaquenil?  ?

## 2021-12-24 DIAGNOSIS — M9905 Segmental and somatic dysfunction of pelvic region: Secondary | ICD-10-CM | POA: Diagnosis not present

## 2021-12-24 DIAGNOSIS — M5136 Other intervertebral disc degeneration, lumbar region: Secondary | ICD-10-CM | POA: Diagnosis not present

## 2021-12-24 DIAGNOSIS — M5137 Other intervertebral disc degeneration, lumbosacral region: Secondary | ICD-10-CM | POA: Diagnosis not present

## 2021-12-24 DIAGNOSIS — M9902 Segmental and somatic dysfunction of thoracic region: Secondary | ICD-10-CM | POA: Diagnosis not present

## 2021-12-24 DIAGNOSIS — M9903 Segmental and somatic dysfunction of lumbar region: Secondary | ICD-10-CM | POA: Diagnosis not present

## 2021-12-24 DIAGNOSIS — M47814 Spondylosis without myelopathy or radiculopathy, thoracic region: Secondary | ICD-10-CM | POA: Diagnosis not present

## 2021-12-29 DIAGNOSIS — M1711 Unilateral primary osteoarthritis, right knee: Secondary | ICD-10-CM | POA: Diagnosis not present

## 2021-12-29 DIAGNOSIS — M25661 Stiffness of right knee, not elsewhere classified: Secondary | ICD-10-CM | POA: Diagnosis not present

## 2021-12-29 DIAGNOSIS — M25561 Pain in right knee: Secondary | ICD-10-CM | POA: Diagnosis not present

## 2021-12-31 ENCOUNTER — Ambulatory Visit (INDEPENDENT_AMBULATORY_CARE_PROVIDER_SITE_OTHER): Payer: Medicare HMO | Admitting: Internal Medicine

## 2021-12-31 ENCOUNTER — Encounter: Payer: Self-pay | Admitting: Internal Medicine

## 2021-12-31 VITALS — BP 124/70 | HR 75 | Temp 99.3°F | Ht <= 58 in | Wt 169.0 lb

## 2021-12-31 DIAGNOSIS — I480 Paroxysmal atrial fibrillation: Secondary | ICD-10-CM

## 2021-12-31 DIAGNOSIS — E1122 Type 2 diabetes mellitus with diabetic chronic kidney disease: Secondary | ICD-10-CM

## 2021-12-31 DIAGNOSIS — N1831 Chronic kidney disease, stage 3a: Secondary | ICD-10-CM

## 2021-12-31 DIAGNOSIS — E6609 Other obesity due to excess calories: Secondary | ICD-10-CM

## 2021-12-31 DIAGNOSIS — E039 Hypothyroidism, unspecified: Secondary | ICD-10-CM | POA: Diagnosis not present

## 2021-12-31 DIAGNOSIS — Z0000 Encounter for general adult medical examination without abnormal findings: Secondary | ICD-10-CM | POA: Diagnosis not present

## 2021-12-31 DIAGNOSIS — I129 Hypertensive chronic kidney disease with stage 1 through stage 4 chronic kidney disease, or unspecified chronic kidney disease: Secondary | ICD-10-CM | POA: Diagnosis not present

## 2021-12-31 DIAGNOSIS — Z01818 Encounter for other preprocedural examination: Secondary | ICD-10-CM | POA: Diagnosis not present

## 2021-12-31 DIAGNOSIS — Z6836 Body mass index (BMI) 36.0-36.9, adult: Secondary | ICD-10-CM | POA: Diagnosis not present

## 2021-12-31 DIAGNOSIS — M1711 Unilateral primary osteoarthritis, right knee: Secondary | ICD-10-CM

## 2021-12-31 LAB — POCT URINALYSIS DIPSTICK
Bilirubin, UA: NEGATIVE
Glucose, UA: NEGATIVE
Ketones, UA: NEGATIVE
Leukocytes, UA: NEGATIVE
Nitrite, UA: NEGATIVE
Protein, UA: POSITIVE — AB
Spec Grav, UA: 1.02 (ref 1.010–1.025)
Urobilinogen, UA: 0.2 E.U./dL
pH, UA: 6.5 (ref 5.0–8.0)

## 2021-12-31 NOTE — Progress Notes (Signed)
?[name],acting as a Education administrator for [name], MD.,have documented all relevant documentation on the behalf of [name], MD,as directed by  [name], MD while in the presence of [name], MD.  ?This visit occurred during the SARS-CoV-2 public health emergency.  Safety protocols were in place, including screening questions prior to the visit, additional usage of staff PPE, and extensive cleaning of exam room while observing appropriate contact time as indicated for disinfecting solutions. ? ?Subjective:  ?  ? Patient ID: Hannah [name] , female    DOB: 02/10/1946 , 76 y.o.   MRN: [id number] ? ? ?Chief Complaint  ?Patient presents with  ? Annual Exam  ? ? ?HPI ? ?She is here today for a full physical exam. She reports compliance with her medication regimen. She denies headaches, chest pain and palpitations. Patient reports her allergies have been really bothering her, she has been coughing and sneezing. She denies fever/chills. Patient reports she is going to see Cyril Mourning, PA-C for her preop appt at [facility]. She has an upcoming total knee arthroplasty in May 2023.  ? ?Diabetes ?She presents for her follow-up diabetic visit. She has type 2 diabetes mellitus. There are no hypoglycemic associated symptoms. Pertinent negatives for hypoglycemia include no headaches. Pertinent negatives for diabetes include no blurred vision and no chest pain. There are no hypoglycemic complications. Diabetic complications include nephropathy. Risk factors for coronary artery disease include diabetes mellitus, dyslipidemia, hypertension, post-menopausal and sedentary lifestyle. She is following a diabetic diet. She participates in exercise three times a week. An ACE inhibitor/angiotensin II receptor blocker is being taken.  ?Hypertension ?This is a chronic problem. The current episode started more than 1 year ago. The problem has been gradually improving since onset. The problem is controlled.  Pertinent negatives include no blurred vision, chest pain or headaches. Risk factors for coronary artery disease include diabetes mellitus, obesity and post-menopausal state. Past treatments include angiotensin blockers. The current treatment provides moderate improvement. Hypertensive end-organ damage includes kidney disease.   ? ?Past Medical History:  ?Diagnosis Date  ? Anemia   ? Arthritis   ? Asthma   ? Atrial fibrillation ([place])   ? Diabetes mellitus without complication ([place])   ? Gout 12/17/2014  ? patient reported  ? Hyperlipidemia   ? Hypertension   ? Systemic lupus erythematosus ([place])   ? Vitiligo   ?  ? ?Family History  ?Problem Relation Age of Onset  ? Heart disease Father   ? Diabetes Father   ? Hypertension Mother   ? Hypertension Sister   ? Hypertension Sister   ? Leukemia Sister   ? ? ? ?Current Outpatient Medications:  ?  Accu-Chek Softclix Lancets lancets, Use to check blood sugars daily E11.69, Disp: 100 each, Rfl: 2 ?  albuterol (PROVENTIL HFA;VENTOLIN HFA) 108 (90 Base) MCG/ACT inhaler, Inhale 2 puffs into the lungs every 6 (six) hours as needed for wheezing or shortness of breath., Disp: 1 Inhaler, Rfl: 2 ?  Alcohol Swabs (ALCOHOL PADS) 70 % PADS, Use as directed to check blood sugars 1 time per day dx: e11.22, Disp: 150 each, Rfl: 2 ?  amLODipine (NORVASC) 2.5 MG tablet, TAKE 1 TABLET(2.5 MG) BY MOUTH DAILY, Disp: 30 tablet, Rfl: 11 ?  apixaban (ELIQUIS) 5 MG TABS tablet, Take 1 tablet (5 mg total) by mouth 2 (two) times daily., Disp: 180 tablet, Rfl: 1 ?  Ascorbic Acid (VITAMIN C) 1000 MG tablet, Take 1,000 mg by mouth daily.,  Disp: , Rfl:  ?  atorvastatin (LIPITOR) 80 MG tablet, TAKE 1 TABLET BY MOUTH EVERY DAY, Disp: 90 tablet, Rfl: 1 ?  budesonide-formoterol (SYMBICORT) 160-4.5 MCG/ACT inhaler, INHALE 2 PUFFS BY MOUTH TWICE DAILY IN THE MORNING AND IN THE EVENING, Disp: 10.2 g, Rfl: 2 ?  Calcium Carb-Cholecalciferol 600-800 MG-UNIT TABS, Take 1 tablet by mouth daily. , Disp: , Rfl:  ?   Cholecalciferol (VITAMIN D PO), Take 2,000 Units by mouth daily., Disp: , Rfl:  ?  cyanocobalamin 1000 MCG tablet, Take 1,000 mcg by mouth daily., Disp: , Rfl:  ?  denosumab (PROLIA) 60 MG/ML SOSY injection, Inject 60 mg into the skin every 6 (six) months. Courier to rheum: [address] [place], [place] [address]. Appt on 12/10/21, Disp: 1 mL, Rfl: 0 ?  glucose blood (ACCU-CHEK GUIDE) test strip, Use to check blood sugars daily E11.69, Disp: 100 each, Rfl: 2 ?  hydroxychloroquine (PLAQUENIL) 200 MG tablet, TAKE 1 TABLET(200 MG) BY MOUTH DAILY, Disp: 90 tablet, Rfl: 0 ?  Hypromellose (ARTIFICIAL TEARS OP), Apply 1 drop to eye daily as needed (dry eyes)., Disp: , Rfl:  ?  irbesartan (AVAPRO) 300 MG tablet, TAKE 1 TABLET(300 MG) BY MOUTH DAILY, Disp: 90 tablet, Rfl: 2 ?  iron polysaccharides (NIFEREX) 150 MG capsule, Take 150 mg by mouth daily., Disp: , Rfl:  ?  Liniments (BEN GAY EX), Apply 1 application topically daily as needed (pain)., Disp: , Rfl:  ?  metoprolol succinate (TOPROL-XL) 25 MG 24 hr tablet, Take 1 tablet (25 mg total) by mouth daily., Disp: 90 tablet, Rfl: 3 ?  SYNTHROID 88 MCG tablet, TAKE 1 TABLET BY MOUTH EVERY DAY MONDAY TO SATURDAY AND OFF ON SUNDAYS, Disp: 90 tablet, Rfl: 0 ?  valsartan-hydrochlorothiazide (DIOVAN-HCT) 160-25 MG tablet, , Disp: , Rfl:  ?  allopurinol (ZYLOPRIM) 100 MG tablet, TAKE 1 TABLET BY MOUTH EVERY DAY FOR GOUT, Disp: 90 tablet, Rfl: 1  ? ?Allergies  ?Allergen Reactions  ? Shellfish Allergy Anaphylaxis and Hives  ? Iodinated Contrast Media   ?  ? ? ?The patient states she uses post menopausal status for birth control. Last LMP was No LMP recorded. Patient is postmenopausal.. Negative for Dysmenorrhea. Negative for: breast discharge, breast lump(s), breast pain and breast self exam. Associated symptoms include abnormal vaginal bleeding. Pertinent negatives include abnormal bleeding (hematology), anxiety, decreased libido, depression, difficulty falling sleep,  dyspareunia, history of infertility, nocturia, sexual dysfunction, sleep disturbances, urinary incontinence, urinary urgency, vaginal discharge and vaginal itching. Diet regular.The patient states her exercise level is  recently lessened due to knee OA.  ? . The patient's tobacco use is:  ?Social History  ? ?Tobacco Use  ?Smoking Status Never  ? Passive exposure: Never  ?Smokeless Tobacco Never  ?[name] She has been exposed to passive smoke. The patient's alcohol use is:  ?Social History  ? ?Substance and Sexual Activity  ?Alcohol Use Not Currently  ? ? ?Review of Systems  ?Constitutional: Negative.   ?HENT: Negative.    ?Eyes: Negative.  Negative for blurred vision.  ?Respiratory: Negative.    ?Cardiovascular: Negative.  Negative for chest pain.  ?Gastrointestinal: Negative.   ?Endocrine: Negative.   ?Genitourinary: Negative.   ?Musculoskeletal:  Positive for arthralgias.  ?Skin: Negative.   ?Allergic/Immunologic: Negative.   ?Neurological: Negative.  Negative for headaches.  ?Hematological: Negative.   ?Psychiatric/Behavioral: Negative.     ? ?Today's Vitals  ? 12/31/21 1036  ?BP: 124/70  ?Pulse: 75  ?Temp: 99.3 ?F (37.4 ?C)  ?  Weight: 169 lb (76.7 kg)  ?Height: '4\' 9"'$  (1.448 m)  ?PainSc: 6   ?PainLoc: Knee  ? ?Body mass index is 36.57 kg/m?.  ?Wt Readings from Last 3 Encounters:  ?12/31/21 169 lb (76.7 kg)  ?11/24/21 170 lb 3.2 oz (77.2 kg)  ?11/21/21 171 lb (77.6 kg)  ?  ? ?Objective:  ?Physical Exam ?Vitals and nursing note reviewed.  ?Constitutional:   ?   Appearance: Normal appearance.  ?HENT:  ?   Head: Normocephalic and atraumatic.  ?   Right Ear: Tympanic membrane, ear canal and external ear normal.  ?   Left Ear: Tympanic membrane, ear canal and external ear normal.  ?   Nose: Nose normal.  ?   Mouth/Throat:  ?   Mouth: Mucous membranes are moist.  ?   Pharynx: Oropharynx is clear.  ?Eyes:  ?   Extraocular Movements: Extraocular movements intact.  ?   Conjunctiva/sclera: Conjunctivae normal.  ?   Pupils: Pupils  are equal, round, and reactive to light.  ?Cardiovascular:  ?   Rate and Rhythm: Normal rate and regular rhythm.  ?   Pulses:     ?     Dorsalis pedis pulses are 2+ on the right side and 2+ on the left side.  ?

## 2021-12-31 NOTE — Patient Instructions (Signed)

## 2022-01-01 LAB — ABO AND RH: Rh Factor: POSITIVE

## 2022-01-01 LAB — PROTIME-INR
INR: 1.1 (ref 0.9–1.2)
Prothrombin Time: 11.4 s (ref 9.1–12.0)

## 2022-01-02 ENCOUNTER — Telehealth: Payer: Medicare HMO

## 2022-01-02 DIAGNOSIS — Z20822 Contact with and (suspected) exposure to covid-19: Secondary | ICD-10-CM | POA: Diagnosis not present

## 2022-01-02 DIAGNOSIS — U071 COVID-19: Secondary | ICD-10-CM | POA: Diagnosis not present

## 2022-01-03 ENCOUNTER — Encounter: Payer: Self-pay | Admitting: Internal Medicine

## 2022-01-03 LAB — CBC
Hematocrit: 31.9 % — ABNORMAL LOW (ref 34.0–46.6)
Hemoglobin: 10.6 g/dL — ABNORMAL LOW (ref 11.1–15.9)
MCH: 31.3 pg (ref 26.6–33.0)
MCHC: 33.2 g/dL (ref 31.5–35.7)
MCV: 94 fL (ref 79–97)
Platelets: 145 10*3/uL — ABNORMAL LOW (ref 150–450)
RBC: 3.39 x10E6/uL — ABNORMAL LOW (ref 3.77–5.28)
RDW: 13.1 % (ref 11.7–15.4)
WBC: 6.1 10*3/uL (ref 3.4–10.8)

## 2022-01-03 LAB — MICROALBUMIN / CREATININE URINE RATIO
Creatinine, Urine: 97.9 mg/dL
Microalb/Creat Ratio: 103 mg/g creat — ABNORMAL HIGH (ref 0–29)
Microalbumin, Urine: 100.7 ug/mL

## 2022-01-03 LAB — TSH: TSH: 1.32 u[IU]/mL (ref 0.450–4.500)

## 2022-01-03 LAB — PROTEIN ELECTROPHORESIS, SERUM
A/G Ratio: 1.2 (ref 0.7–1.7)
Albumin ELP: 3.6 g/dL (ref 2.9–4.4)
Alpha 1: 0.3 g/dL (ref 0.0–0.4)
Alpha 2: 0.9 g/dL (ref 0.4–1.0)
Beta: 0.9 g/dL (ref 0.7–1.3)
Gamma Globulin: 1.1 g/dL (ref 0.4–1.8)
Globulin, Total: 3.1 g/dL (ref 2.2–3.9)

## 2022-01-03 LAB — CMP14+EGFR
ALT: 14 IU/L (ref 0–32)
AST: 24 IU/L (ref 0–40)
Albumin/Globulin Ratio: 1.8 (ref 1.2–2.2)
Albumin: 4.3 g/dL (ref 3.7–4.7)
Alkaline Phosphatase: 59 IU/L (ref 44–121)
BUN/Creatinine Ratio: 15 (ref 12–28)
BUN: 18 mg/dL (ref 8–27)
Bilirubin Total: 0.5 mg/dL (ref 0.0–1.2)
CO2: 23 mmol/L (ref 20–29)
Calcium: 8.9 mg/dL (ref 8.7–10.3)
Chloride: 104 mmol/L (ref 96–106)
Creatinine, Ser: 1.22 mg/dL — ABNORMAL HIGH (ref 0.57–1.00)
Globulin, Total: 2.4 g/dL (ref 1.5–4.5)
Glucose: 92 mg/dL (ref 70–99)
Potassium: 4.7 mmol/L (ref 3.5–5.2)
Sodium: 142 mmol/L (ref 134–144)
Total Protein: 6.7 g/dL (ref 6.0–8.5)
eGFR: 46 mL/min/{1.73_m2} — ABNORMAL LOW (ref >59)

## 2022-01-03 LAB — LIPID PANEL
Chol/HDL Ratio: 2.2 ratio (ref 0.0–4.4)
Cholesterol, Total: 126 mg/dL (ref 100–199)
HDL: 57 mg/dL (ref >39)
LDL Chol Calc (NIH): 60 mg/dL (ref 0–99)
Triglycerides: 30 mg/dL (ref 0–149)
VLDL Cholesterol Cal: 9 mg/dL (ref 5–40)

## 2022-01-03 LAB — PTH, INTACT AND CALCIUM: PTH: 239 pg/mL — ABNORMAL HIGH (ref 15–65)

## 2022-01-03 LAB — PHOSPHORUS: Phosphorus: 3.7 mg/dL (ref 3.0–4.3)

## 2022-01-03 LAB — HEMOGLOBIN A1C
Est. average glucose Bld gHb Est-mCnc: 111 mg/dL
Hgb A1c MFr Bld: 5.5 % (ref 4.8–5.6)

## 2022-01-03 LAB — T4, FREE: Free T4: 1.44 ng/dL (ref 0.82–1.77)

## 2022-01-05 ENCOUNTER — Telehealth: Payer: Self-pay

## 2022-01-05 ENCOUNTER — Encounter: Payer: Self-pay | Admitting: Internal Medicine

## 2022-01-05 NOTE — Telephone Encounter (Signed)
The patient was asked how she is doing and the pt said she is feeling better, no fever, diarrhea is getting better and that she wanted to know if she should go pickup the prescription of paxlovid that the urgent care sent to the pharmacy for her to take.  ?

## 2022-01-05 NOTE — Telephone Encounter (Signed)
The patient was told that dr sanders said to pickup the medication that was sent to the pharmacy from the urgent care for her covid symptoms. ?

## 2022-01-06 ENCOUNTER — Telehealth: Payer: Self-pay

## 2022-01-06 NOTE — Chronic Care Management (AMB) (Signed)
? ? ?  Maelle CHERISA BRUCKER was reminded to have all medications, supplements and any blood glucose and blood pressure readings available for review with [name], Pharm. D, at her telephone visit on 01-09-2022 at 2:00. ? ? ? ?Jeannette How CMA ?Clinical Pharmacist Assistant ?[phone] ? ? ?

## 2022-01-08 ENCOUNTER — Other Ambulatory Visit: Payer: Self-pay | Admitting: Internal Medicine

## 2022-01-09 ENCOUNTER — Telehealth: Payer: Medicare HMO

## 2022-01-13 ENCOUNTER — Encounter: Payer: Self-pay | Admitting: Internal Medicine

## 2022-01-13 ENCOUNTER — Telehealth: Payer: Medicare HMO

## 2022-01-13 ENCOUNTER — Ambulatory Visit (INDEPENDENT_AMBULATORY_CARE_PROVIDER_SITE_OTHER): Payer: Medicare HMO

## 2022-01-13 DIAGNOSIS — M170 Bilateral primary osteoarthritis of knee: Secondary | ICD-10-CM | POA: Diagnosis not present

## 2022-01-13 DIAGNOSIS — M1711 Unilateral primary osteoarthritis, right knee: Secondary | ICD-10-CM

## 2022-01-13 DIAGNOSIS — J4520 Mild intermittent asthma, uncomplicated: Secondary | ICD-10-CM

## 2022-01-13 DIAGNOSIS — I480 Paroxysmal atrial fibrillation: Secondary | ICD-10-CM

## 2022-01-13 DIAGNOSIS — E1122 Type 2 diabetes mellitus with diabetic chronic kidney disease: Secondary | ICD-10-CM

## 2022-01-13 DIAGNOSIS — D508 Other iron deficiency anemias: Secondary | ICD-10-CM

## 2022-01-13 DIAGNOSIS — M1712 Unilateral primary osteoarthritis, left knee: Secondary | ICD-10-CM | POA: Diagnosis not present

## 2022-01-13 DIAGNOSIS — M0579 Rheumatoid arthritis with rheumatoid factor of multiple sites without organ or systems involvement: Secondary | ICD-10-CM

## 2022-01-13 DIAGNOSIS — I129 Hypertensive chronic kidney disease with stage 1 through stage 4 chronic kidney disease, or unspecified chronic kidney disease: Secondary | ICD-10-CM

## 2022-01-13 DIAGNOSIS — N183 Chronic kidney disease, stage 3 unspecified: Secondary | ICD-10-CM

## 2022-01-13 NOTE — Progress Notes (Signed)
This encounter was created in error - please disregard.

## 2022-01-14 NOTE — Chronic Care Management (AMB) (Signed)
?Chronic Care Management  ? ?CCM RN Visit Note ? ?01/13/2022 ?Name: Hannah [name] MRN: [id number] DOB: 05-02-1946 ? ?Subjective: ?Hannah [name] is a 76 y.o. year old female who is a primary care patient of Glendale Chard, MD. The care management team was consulted for assistance with disease management and care coordination needs.   ? ?Engaged with patient by telephone for follow up visit in response to provider referral for case management and/or care coordination services.  ? ?Consent to Services:  ?The patient was given information about Chronic Care Management services, agreed to services, and gave verbal consent prior to initiation of services.  Please see initial visit note for detailed documentation.  ? ?Patient agreed to services and verbal consent obtained.  ? ?Assessment: Review of patient past medical history, allergies, medications, health status, including review of consultants reports, laboratory and other test data, was performed as part of comprehensive evaluation and provision of chronic care management services.  ? ?SDOH (Social Determinants of Health) assessments and interventions performed:  Yes, no acute challenges ? ?CCM Care Plan ? ?Allergies  ?Allergen Reactions  ? Shellfish Allergy Anaphylaxis and Hives  ? ? ?Outpatient Encounter Medications as of 01/13/2022  ?Medication Sig Note  ? Accu-Chek Softclix Lancets lancets Use to check blood sugars daily E11.69   ? albuterol (PROVENTIL HFA;VENTOLIN HFA) 108 (90 Base) MCG/ACT inhaler Inhale 2 puffs into the lungs every 6 (six) hours as needed for wheezing or shortness of breath.   ? Alcohol Swabs (ALCOHOL PADS) 70 % PADS Use as directed to check blood sugars 1 time per day dx: e11.22   ? allopurinol (ZYLOPRIM) 100 MG tablet TAKE 1 TABLET BY MOUTH EVERY DAY FOR GOUT   ? amLODipine (NORVASC) 2.5 MG tablet TAKE 1 TABLET(2.5 MG) BY MOUTH DAILY   ? apixaban (ELIQUIS) 5 MG TABS tablet Take 1 tablet (5 mg total) by mouth 2 (two) times daily.   ?  Ascorbic Acid (VITAMIN C) 1000 MG tablet Take 1,000 mg by mouth daily.   ? atorvastatin (LIPITOR) 80 MG tablet TAKE 1 TABLET BY MOUTH EVERY DAY   ? budesonide-formoterol (SYMBICORT) 160-4.5 MCG/ACT inhaler INHALE 2 PUFFS BY MOUTH TWICE DAILY IN THE MORNING AND IN THE EVENING (Patient taking differently: Inhale 2 puffs into the lungs 2 (two) times daily as needed (asthma).)   ? Calcium Carb-Cholecalciferol 600-800 MG-UNIT TABS Take 1 tablet by mouth daily.    ? Cholecalciferol (VITAMIN D PO) Take 2,000 Units by mouth daily.   ? cyanocobalamin 1000 MCG tablet Take 1,000 mcg by mouth daily. 01/14/2022: B12  ? denosumab (PROLIA) 60 MG/ML SOSY injection Inject 60 mg into the skin every 6 (six) months. Courier to rheum: [address] [place], [place] [address]. Appt on 12/10/21   ? glucose blood (ACCU-CHEK GUIDE) test strip Use to check blood sugars daily E11.69   ? hydroxychloroquine (PLAQUENIL) 200 MG tablet TAKE 1 TABLET(200 MG) BY MOUTH DAILY   ? Hypromellose (ARTIFICIAL TEARS OP) Apply 1 drop to eye daily as needed (dry eyes).   ? irbesartan (AVAPRO) 300 MG tablet TAKE 1 TABLET(300 MG) BY MOUTH DAILY   ? iron polysaccharides (NIFEREX) 150 MG capsule Take 150 mg by mouth daily.   ? Liniments (BEN GAY EX) Apply 1 application topically daily as needed (pain).   ? metoprolol succinate (TOPROL-XL) 25 MG 24 hr tablet Take 1 tablet (25 mg total) by mouth daily.   ? SYNTHROID 88 MCG tablet TAKE 1 TABLET BY MOUTH EVERY DAY  MONDAY TO SATURDAY AND OFF ON SUNDAYS   ? valsartan-hydrochlorothiazide (DIOVAN-HCT) 160-25 MG tablet Take 1 tablet by mouth daily.   ? ?No facility-administered encounter medications on file as of 01/13/2022.  ? ? ?Patient Active Problem List  ? Diagnosis Date Noted  ? Chronic bilateral low back pain without sciatica 06/25/2021  ? Paresthesia and pain of extremity 06/25/2021  ? Atrial fibrillation ([place]) 04/16/2021  ? Demand ischemia ([place]) 03/26/2021  ? Unspecified atrial fibrillation ([place]) 03/26/2021   ? COVID-19 virus infection 03/26/2021  ? PVC (premature ventricular contraction) 05/06/2020  ? Type 2 diabetes mellitus with stage 3 chronic kidney disease, without long-term current use of insulin ([place]) 03/20/2020  ? Palpitations 03/20/2020  ? 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/2020  ? Primary hypothyroidism 03/20/2020  ? Vitiligo 03/20/2020  ? Iron deficiency anemia 08/12/2017  ? Osteoporosis 10/02/2016  ? Systemic lupus erythematosus ([place]) 10/02/2016  ? Rheumatoid arthritis involving multiple sites with positive rheumatoid factor ([place]) 10/02/2016  ? High risk medication use 10/02/2016  ? History of chronic kidney disease 10/02/2016  ? 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/2016  ? Primary osteoarthritis of both knees 10/02/2016  ? Vitamin D deficiency 10/02/2016  ? History of diabetes mellitus 10/02/2016  ? History of hypertension 10/02/2016  ? History of asthma 10/02/2016  ? Absolute anemia   ? MGUS (monoclonal gammopathy of unknown significance)   ? Normocytic anemia 03/21/2015  ? Essential hypertension 03/20/2015  ? Abnormal CT of the chest 07/30/2012  ? Asthma 06/19/2012  ? ? ?Conditions to be addressed/monitored: CKD III, hypertensive nephropathy, DM, RA, iron deficiency Anemia, Asthma, Primary Osteoarthritis ? ?Care Plan : RN Care Manager Plan of Care  ?Updates made by Lynne Logan, RN since 01/13/2022 12:00 AM  ?  ? ?Problem: No Plan of Care established for management of chronic disease states (CKD III, hypertensive nephropathy, DM, RA, iron deficiency Anemia, Asthma)   ?Priority: High  ?  ? ?Long-Range Goal: Establishment of plan of care for management of chronic disease states (CKD III, hypertensive nephropathy, DM, RA, iron deficiency Anemia, Asthma)   ?Start Date: 10/22/2021  ?Expected End Date: 10/22/2022  ?Recent Progress: On track  ?Priority: High  ?Note:   ?Current Barriers:  ?Knowledge Deficits related to plan of care for management of CKD III, hypertensive nephropathy, DM,  RA, iron deficiency Anemia, Asthma  ?Chronic Disease Management support and education needs related to CKD III, hypertensive nephropathy, DM, RA, iron deficiency Anemia, Asthma  ? ?RNCM Clinical Goal(s):  ?Patient will verbalize basic understanding of  CKD III, hypertensive nephropathy, DM, RA, iron deficiency Anemia, Asthma disease process and self health management plan as evidenced by patient will report having no disease exacerbations related to her chronic disease states as listed above ?take all medications exactly as prescribed and will call provider for medication related questions as evidenced by patient will demonstrate improved understanding of prescribed medications and rationale for usage as evidenced by patient teach back ?demonstrate Improved health management independence as evidenced by patient will report 100% to her prescribed treatment plan  ?continue to work with RN Care Manager to address care management and care coordination needs related to  CKD III, hypertensive nephropathy, DM, RA, iron deficiency Anemia, Asthma as evidenced by adherence to CM Team Scheduled appointments ?demonstrate ongoing self health care management ability   as evidenced by    through collaboration with RN Care manager, provider, and care team.  ? ?Interventions: ?1:1 collaboration with primary care provider regarding development and update  of comprehensive plan of care as evidenced by provider attestation and co-signature ?Inter-disciplinary care team collaboration (see longitudinal plan of care) ?Evaluation of current treatment plan related to  self management and patient's adherence to plan as established by provider ? ? Chronic Kidney Disease Interventions:  (Status:  Goal on track:  Yes.) Long Term Goal ?Assessed the Patient understanding of chronic kidney disease    ?Evaluation of current treatment plan related to chronic kidney disease self management and patient's adherence to plan as established by provider       ?Reviewed prescribed water intake, 48-64 oz daily unless otherwise directed ?Advised patient, providing education and rationale, to monitor blood pressure daily and record, calling PCP for findings out

## 2022-01-14 NOTE — H&P (Addendum)
TOTAL KNEE ADMISSION H&P ? ?Patient is being admitted for left total knee arthroplasty. ? ?Subjective: ? ?Chief Complaint: Left knee pain. ? ?HPI: Hannah [name], 76 y.o. female has a history of pain and functional disability in the left knee due to arthritis and has failed non-surgical conservative treatments for greater than 12 weeks to include corticosteriod injections and activity modification. Onset of symptoms was gradual, starting  several  years ago with gradually worsening course since that time. The patient noted no past surgery on the left knee.  Patient currently rates pain in the left knee at 8 out of 10 with activity. Patient has night pain, pain that interferes with activities of daily living, pain with passive range of motion, and crepitus. Patient has evidence of  severe arthritic changes with bone-on-bone OA in the lateral and patellofemoral compartments. Large marginal osteophytes throughout with bilateral valgus deformity  by imaging studies. There is no active infection. ? ?Patient Active Problem List  ? Diagnosis Date Noted  ? Chronic bilateral low back pain without sciatica 06/25/2021  ? Paresthesia and pain of extremity 06/25/2021  ? Atrial fibrillation ([place]) 04/16/2021  ? Demand ischemia ([place]) 03/26/2021  ? Unspecified atrial fibrillation ([place]) 03/26/2021  ? COVID-19 virus infection 03/26/2021  ? PVC (premature ventricular contraction) 05/06/2020  ? Type 2 diabetes mellitus with stage 3 chronic kidney disease, without long-term current use of insulin ([place]) 03/20/2020  ? Palpitations 03/20/2020  ? 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/2020  ? Primary hypothyroidism 03/20/2020  ? Vitiligo 03/20/2020  ? Iron deficiency anemia 08/12/2017  ? Osteoporosis 10/02/2016  ? Systemic lupus erythematosus ([place]) 10/02/2016  ? Rheumatoid arthritis involving multiple sites with positive rheumatoid factor ([place]) 10/02/2016  ? High risk medication use 10/02/2016  ? History of chronic kidney disease 10/02/2016  ?  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/2016  ? Primary osteoarthritis of both knees 10/02/2016  ? Vitamin D deficiency 10/02/2016  ? History of diabetes mellitus 10/02/2016  ? History of hypertension 10/02/2016  ? History of asthma 10/02/2016  ? Absolute anemia   ? MGUS (monoclonal gammopathy of unknown significance)   ? Normocytic anemia 03/21/2015  ? Essential hypertension 03/20/2015  ? Abnormal CT of the chest 07/30/2012  ? Asthma 06/19/2012  ? ? ?Past Medical History:  ?Diagnosis Date  ? Anemia   ? Arthritis   ? Asthma   ? Atrial fibrillation ([place])   ? Diabetes mellitus without complication ([place])   ? Gout 12/17/2014  ? patient reported  ? Hyperlipidemia   ? Hypertension   ? Systemic lupus erythematosus ([place])   ? Vitiligo   ? ? ?Past Surgical History:  ?Procedure Laterality Date  ? CATARACT EXTRACTION Bilateral 2015  ? DILATION AND CURETTAGE OF UTERUS    ? DOPPLER ECHOCARDIOGRAPHY  05/2018  ? Internist to review with pt; potential heart murmur 06/20/18  ? keratosis removal  2021  ? SKIN SURGERY  11/30/2018  ? left side of face  ? TOOTH EXTRACTION    ? ? ?Prior to Admission medications   ?Medication Sig Start Date End Date Taking? Authorizing Provider  ?Accu-Chek Softclix Lancets lancets Use to check blood sugars daily E11.69 11/04/20   Glendale Chard, MD  ?albuterol (PROVENTIL HFA;VENTOLIN HFA) 108 (90 Base) MCG/ACT inhaler Inhale 2 puffs into the lungs every 6 (six) hours as needed for wheezing or shortness of breath. 09/02/18   Minette Brine, FNP  ?Alcohol Swabs (ALCOHOL PADS) 70 % PADS Use as directed to check blood sugars 1 time  per day dx: e11.22 06/28/20   Glendale Chard, MD  ?allopurinol (ZYLOPRIM) 100 MG tablet TAKE 1 TABLET BY MOUTH EVERY DAY FOR GOUT 01/08/22   Glendale Chard, MD  ?amLODipine (NORVASC) 2.5 MG tablet TAKE 1 TABLET(2.5 MG) BY MOUTH DAILY 11/20/21   Glendale Chard, MD  ?apixaban (ELIQUIS) 5 MG TABS tablet Take 1 tablet (5 mg total) by mouth 2 (two) times daily. 12/18/21    Nahser, Wonda Cheng, MD  ?Ascorbic Acid (VITAMIN C) 1000 MG tablet Take 1,000 mg by mouth daily.    [provider]  ?atorvastatin (LIPITOR) 80 MG tablet TAKE 1 TABLET BY MOUTH EVERY DAY 10/08/21   Glendale Chard, MD  ?budesonide-formoterol [facility]) 160-4.5 MCG/ACT inhaler INHALE 2 PUFFS BY MOUTH TWICE DAILY IN THE MORNING AND IN THE EVENING 08/19/21   Glendale Chard, MD  ?Calcium Carb-Cholecalciferol 600-800 MG-UNIT TABS Take 1 tablet by mouth daily.     [provider]  ?Cholecalciferol (VITAMIN D PO) Take 2,000 Units by mouth daily.    [provider]  ?cyanocobalamin 1000 MCG tablet Take 1,000 mcg by mouth daily.    [provider]  ?denosumab (PROLIA) 60 MG/ML SOSY injection Inject 60 mg into the skin every 6 (six) months. Courier to rheum: [address] [place], [place] [address]. Appt on 12/10/21 11/25/21 11/25/22  Ofilia Neas, PA-C  ?glucose blood (ACCU-CHEK GUIDE) test strip Use to check blood sugars daily E11.69 11/04/20   Glendale Chard, MD  ?hydroxychloroquine (PLAQUENIL) 200 MG tablet TAKE 1 TABLET(200 MG) BY MOUTH DAILY 12/23/21   Ofilia Neas, PA-C  ?Hypromellose (ARTIFICIAL TEARS OP) Apply 1 drop to eye daily as needed (dry eyes).    [provider]  ?irbesartan (AVAPRO) 300 MG tablet TAKE 1 TABLET(300 MG) BY MOUTH DAILY 09/29/21   Glendale Chard, MD  ?iron polysaccharides (NIFEREX) 150 MG capsule Take 150 mg by mouth daily.    [provider]  ?Liniments (BEN GAY EX) Apply 1 application topically daily as needed (pain).    [provider]  ?metoprolol succinate (TOPROL-XL) 25 MG 24 hr tablet Take 1 tablet (25 mg total) by mouth daily. 04/16/21   Nahser, Wonda Cheng, MD  ?SYNTHROID 88 MCG tablet TAKE 1 TABLET BY MOUTH EVERY DAY MONDAY TO SATURDAY AND OFF ON SUNDAYS 05/19/21   Glendale Chard, MD  ?valsartan-hydrochlorothiazide (DIOVAN-HCT) 160-25 MG tablet  09/18/21   [provider]  ? ? ?Allergies  ?Allergen Reactions  ?  Shellfish Allergy Anaphylaxis and Hives  ? Iodinated Contrast Media   ? ? ?Social History  ? ?Socioeconomic History  ? Marital status: Single  ?  Spouse name: Not on file  ? Number of children: 0  ? Years of education: Not on file  ? Highest education level: Not on file  ?Occupational History  ? Occupation: retired  ?Tobacco Use  ? Smoking status: Never  ?  Passive exposure: Never  ? Smokeless tobacco: Never  ?Vaping Use  ? Vaping Use: Never used  ?Substance and Sexual Activity  ? Alcohol use: Not Currently  ? Drug use: No  ? Sexual activity: Not Currently  ?  Birth control/protection: Post-menopausal  ?  Comment: 1ST intercourse- 7 , partners- 4,   ?Other Topics Concern  ? Not on file  ?Social History Narrative  ? Not on file  ? ?Social Determinants of Health  ? ?Financial Resource Strain: Low Risk   ? Difficulty of Paying Living Expenses: Not hard at all  ?Food Insecurity: No  Food Insecurity  ? Worried About Charity fundraiser in the Last Year: Never true  ? Ran Out of Food in the Last Year: Never true  ?Transportation Needs: No Transportation Needs  ? Lack of Transportation (Medical): No  ? Lack of Transportation (Non-Medical): No  ?Physical Activity: Sufficiently Active  ? Days of Exercise per Week: 5 days  ? Minutes of Exercise per Session: 40 min  ?Stress: Stress Concern Present  ? Feeling of Stress : To some extent  ?Social Connections: Moderately Integrated  ? Frequency of Communication with Friends and Family: More than three times a week  ? Frequency of Social Gatherings with Friends and Family: More than three times a week  ? Attends Religious Services: More than 4 times per year  ? Active Member of Clubs or Organizations: Yes  ? Attends Archivist Meetings: 1 to 4 times per year  ? Marital Status: Never married  ?Intimate Partner Violence: Not At Risk  ? Fear of Current or Ex-Partner: No  ? Emotionally Abused: No  ? Physically Abused: No  ? Sexually Abused: No  ? ? ?Tobacco Use: Low Risk   ?  Smoking Tobacco Use: Never  ? Smokeless Tobacco Use: Never  ? Passive Exposure: Never  ? ?Social History  ? ?Substance and Sexual Activity  ?Alcohol Use Not Currently  ? ? ?Family History  ?Problem Relation Age

## 2022-01-14 NOTE — Patient Instructions (Signed)
Visit Information ? ?Thank you for taking time to visit with me today. Please don't hesitate to contact me if I can be of assistance to you before our next scheduled telephone appointment. ? ?Following are the goals we discussed today:  ?(Copy and paste patient goals from clinical care plan here) ? ?Our next appointment is by telephone on 02/09/22 at 12:45 PM ? ?Please call the care guide team at [phone] if you need to cancel or reschedule your appointment.  ? ?If you are experiencing a Mental Health or [place] or need someone to talk to, please call 1-800-273-TALK (toll free, 24 hour hotline)  ? ?Patient verbalizes understanding of instructions and care plan provided today and agrees to view in [place]. Active MyChart status confirmed with patient.   ? ?Barb Merino, RN, BSN, CCM ?Care Management Coordinator ?[facility]  ?Direct Phone: [phone] ? ? ?

## 2022-01-16 DIAGNOSIS — N183 Chronic kidney disease, stage 3 unspecified: Secondary | ICD-10-CM | POA: Diagnosis not present

## 2022-01-16 DIAGNOSIS — N2581 Secondary hyperparathyroidism of renal origin: Secondary | ICD-10-CM | POA: Diagnosis not present

## 2022-01-16 DIAGNOSIS — D472 Monoclonal gammopathy: Secondary | ICD-10-CM | POA: Diagnosis not present

## 2022-01-16 DIAGNOSIS — I129 Hypertensive chronic kidney disease with stage 1 through stage 4 chronic kidney disease, or unspecified chronic kidney disease: Secondary | ICD-10-CM | POA: Diagnosis not present

## 2022-01-16 DIAGNOSIS — E1122 Type 2 diabetes mellitus with diabetic chronic kidney disease: Secondary | ICD-10-CM | POA: Diagnosis not present

## 2022-01-16 DIAGNOSIS — D631 Anemia in chronic kidney disease: Secondary | ICD-10-CM | POA: Diagnosis not present

## 2022-01-16 DIAGNOSIS — M329 Systemic lupus erythematosus, unspecified: Secondary | ICD-10-CM | POA: Diagnosis not present

## 2022-01-19 DIAGNOSIS — M5136 Other intervertebral disc degeneration, lumbar region: Secondary | ICD-10-CM | POA: Diagnosis not present

## 2022-01-19 DIAGNOSIS — M9905 Segmental and somatic dysfunction of pelvic region: Secondary | ICD-10-CM | POA: Diagnosis not present

## 2022-01-19 DIAGNOSIS — M9903 Segmental and somatic dysfunction of lumbar region: Secondary | ICD-10-CM | POA: Diagnosis not present

## 2022-01-19 DIAGNOSIS — M47814 Spondylosis without myelopathy or radiculopathy, thoracic region: Secondary | ICD-10-CM | POA: Diagnosis not present

## 2022-01-19 DIAGNOSIS — M9902 Segmental and somatic dysfunction of thoracic region: Secondary | ICD-10-CM | POA: Diagnosis not present

## 2022-01-19 DIAGNOSIS — M5137 Other intervertebral disc degeneration, lumbosacral region: Secondary | ICD-10-CM | POA: Diagnosis not present

## 2022-01-19 NOTE — Progress Notes (Addendum)
COVID Vaccine Completed: yes x5 ?Date COVID Vaccine completed: 11/20/19, 12/19/19 ?Has received booster: 11/9/211, 01/01/21, 08/13/21 ?COVID vaccine manufacturer: [employer]  ? ?Date of COVID positive in last 90 days: yes 01/02/22 CE ? ?PCP - Glendale Chard, MD ?Cardiologist - Cleatrice Burke, MD ? ?Nephrology clearance by Paulene Floor 01/16/22 on chart ? ?Medical clearance by Glendale Chard 12/31/21 on Epic ? ?Cardiac clearance by Cleatrice Burke 10/14/21 Epic ? ?Chest x-ray - 03/26/21 Epic ?EKG - 10/15/21 Epic ?Stress Test - n/a ?ECHO - 03/27/21 Epic ?Cardiac Cath - n/a ?Pacemaker/ICD device last checked: n/a ?Spinal Cord Stimulator: n/a ? ?Bowel Prep - no ? ?Sleep Study - yes, borderline ?CPAP - no ? ?Fasting Blood Sugar - not currently checking, last month or two ?Checks Blood Sugar  ? ?Blood Thinner Instructions: Eliquis, hold 3 days  ?Aspirin Instructions: ?Last Dose: 01/29/22 0800 ? ?Activity level: Can perform activities of daily living without stopping and without symptoms of chest pain or shortness of breath. No stairs currently due to knee pain ?    ? ?Anesthesia review: HTN, a fib, asthma, DM, lupus, CKD ? ?Patient denies shortness of breath, fever, cough and chest pain at PAT appointment ? ? ?Patient verbalized understanding of instructions that were given to them at the PAT appointment. Patient was also instructed that they will need to review over the PAT instructions again at home before surgery.  ?

## 2022-01-19 NOTE — Patient Instructions (Addendum)
DUE TO COVID-19 ONLY TWO VISITORS  (aged 77 and older)  ARE ALLOWED TO COME WITH YOU AND STAY IN THE WAITING ROOM ONLY DURING PRE OP AND PROCEDURE.   ?**NO VISITORS ARE ALLOWED IN THE SHORT STAY AREA OR RECOVERY ROOM!!** ? ?IF YOU WILL BE ADMITTED INTO THE HOSPITAL YOU ARE ALLOWED ONLY FOUR SUPPORT PEOPLE DURING VISITATION HOURS ONLY (7 AM -8PM)   ?The support person(s) must pass our screening, gel in and out, and wear a mask at all times, including in the patient?s room. ?Patients must also wear a mask when staff or their support person are in the room. ?Visitors GUEST BADGE MUST BE WORN VISIBLY  ?One adult visitor may remain with you overnight and MUST be in the room by 8 P.M. ?  ? ? Your procedure is scheduled on: 02/02/22 ? ? Report to [facility] Main Entrance ? ?  Report to admitting at 7:45 AM ? ? Call this number if you have problems the morning of surgery [phone] ? ? Do not eat food :After Midnight. ? ? After Midnight you may have the following liquids until 7:30 AM DAY OF SURGERY ? ?Water ?Black Coffee (sugar ok, NO MILK/CREAM OR CREAMERS)  ?Tea (sugar ok, NO MILK/CREAM OR CREAMERS) regular and decaf                             ?Plain Jell-O (NO RED)                                           ?Fruit ices (not with fruit pulp, NO RED)                                     ?Popsicles (NO RED)                                                                  ?Juice: apple, WHITE grape, WHITE cranberry ?Sports drinks like Gatorade (NO RED) ?Clear broth(vegetable,chicken,beef) ? ?              ?The day of surgery:  ?Drink ONE (1) Pre-Surgery G2 at 7:30 AM the morning of surgery. Drink in one sitting. Do not sip.  ?This drink was given to you during your hospital  ?pre-op appointment visit. ?Nothing else to drink after completing the  ?Pre-Surgery G2. ?  ?       If you have questions, please contact your surgeon?s office. ? ? ?FOLLOW BOWEL PREP AND ANY ADDITIONAL PRE OP INSTRUCTIONS YOU RECEIVED FROM  YOUR SURGEON'S OFFICE!!! ?  ?  ?Oral Hygiene is also important to reduce your risk of infection.                                    ?Remember - BRUSH YOUR TEETH THE MORNING OF SURGERY WITH YOUR REGULAR TOOTHPASTE ? ? Take these medicines the morning of surgery with A SIP OF WATER: Inhalers, Allopurinol, Amlodipine, Atorvastatin,  Metoprolol, Synthroid. ? ?DO NOT TAKE ANY ORAL DIABETIC MEDICATIONS DAY OF YOUR SURGERY ? ?How to Manage Your Diabetes ?Before and After Surgery ? ?Why is it important to control my blood sugar before and after surgery? ?Improving blood sugar levels before and after surgery helps healing and can limit problems. ?A way of improving blood sugar control is eating a healthy diet by: ? Eating less sugar and carbohydrates ? Increasing activity/exercise ? Talking with your doctor about reaching your blood sugar goals ?High blood sugars (greater than 180 mg/dL) can raise your risk of infections and slow your recovery, so you will need to focus on controlling your diabetes during the weeks before surgery. ?Make sure that the doctor who takes care of your diabetes knows about your planned surgery including the date and location. ? ?How do I manage my blood sugar before surgery? ?Check your blood sugar at least 4 times a day, starting 2 days before surgery, to make sure that the level is not too high or low. ?Check your blood sugar the morning of your surgery when you wake up and every 2 hours until you get to the Short Stay unit. ?If your blood sugar is less than 70 mg/dL, you will need to treat for low blood sugar: ?Do not take insulin. ?Treat a low blood sugar (less than 70 mg/dL) with ? cup of clear juice (cranberry or apple), 4 glucose tablets, OR glucose gel. ?Recheck blood sugar in 15 minutes after treatment (to make sure it is greater than 70 mg/dL). If your blood sugar is not greater than 70 mg/dL on recheck, call [phone] for further instructions. ?Report your blood sugar to the short  stay nurse when you get to Short Stay. ? ?If you are admitted to the hospital after surgery: ?Your blood sugar will be checked by the staff and you will probably be given insulin after surgery (instead of oral diabetes medicines) to make sure you have good blood sugar levels. ?The goal for blood sugar control after surgery is 80-180 mg/dL. ? ?Reviewed and Endorsed by [facility] Patient Education Committee, August 2015  ?                  ?           You may not have any metal on your body including hair pins, jewelry, and body piercing ? ?           Do not wear make-up, lotions, powders, perfumes, or deodorant ? ?Do not wear nail polish including gel and S&S, artificial/acrylic nails, or any other type of covering on natural nails including finger and toenails. If you have artificial nails, gel coating, etc. that needs to be removed by a nail salon please have this removed prior to surgery or surgery may need to be canceled/ delayed if the surgeon/ anesthesia feels like they are unable to be safely monitored.  ? ?Do not shave  48 hours prior to surgery.  ? ? Do not bring valuables to the hospital. [facility] NOT ?            RESPONSIBLE   FOR VALUABLES. ? ? Bring small overnight bag day of surgery. ?  ? Special Instructions: Bring a copy of your healthcare power of attorney and living will documents         the day of surgery if you haven't scanned them before. ? ?            Please read  over the following fact sheets you were given: IF YOU HAVE [place] [phone]- Apolonio Schneiders ? ?   [place] - Preparing for Surgery ?Before surgery, you can play an important role.  Because skin is not sterile, your skin needs to be as free of germs as possible.  You can reduce the number of germs on your skin by washing with CHG (chlorahexidine gluconate) soap before surgery.  CHG is an antiseptic cleaner which kills germs and bonds with the skin to continue killing germs even after  washing. ?Please DO NOT use if you have an allergy to CHG or antibacterial soaps.  If your skin becomes reddened/irritated stop using the CHG and inform your nurse when you arrive at Short Stay. ?Do not shave (including legs and underarms) for at least 48 hours prior to the first CHG shower.  You may shave your face/neck. ? ?Please follow these instructions carefully: ? 1.  Shower with CHG Soap the night before surgery and the  morning of surgery. ? 2.  If you choose to wash your hair, wash your hair first as usual with your normal  shampoo. ? 3.  After you shampoo, rinse your hair and body thoroughly to remove the shampoo.                            ? 4.  Use CHG as you would any other liquid soap.  You can apply chg directly to the skin and wash.  Gently with a scrungie or clean washcloth. ? 5.  Apply the CHG Soap to your body ONLY FROM THE NECK DOWN.   Do   not use on face/ open      ?                     Wound or open sores. Avoid contact with eyes, ears mouth and   genitals (private parts).  ?                     Production manager,  Genitals (private parts) with your normal soap. ?            6.  Wash thoroughly, paying special attention to the area where your    surgery  will be performed. ? 7.  Thoroughly rinse your body with warm water from the neck down. ? 8.  DO NOT shower/wash with your normal soap after using and rinsing off the CHG Soap. ?               9.  Pat yourself dry with a clean towel. ?           10.  Wear clean pajamas. ?           11.  Place clean sheets on your bed the night of your first shower and do not  sleep with pets. ?Day of Surgery : ?Do not apply any lotions/deodorants the morning of surgery.  Please wear clean clothes to the hospital/surgery center. ? ?FAILURE TO FOLLOW THESE INSTRUCTIONS MAY RESULT IN THE CANCELLATION OF YOUR SURGERY ? ?PATIENT SIGNATURE_________________________________ ? ?NURSE  SIGNATURE__________________________________ ? ?________________________________________________________________________  ? ? ?Incentive Spirometer ? ?An incentive spirometer is a tool that can help keep your lungs clear and active. This tool measures how well you are filling your lungs with each brea

## 2022-01-20 ENCOUNTER — Encounter (HOSPITAL_COMMUNITY)
Admission: RE | Admit: 2022-01-20 | Discharge: 2022-01-20 | Disposition: A | Discharge status: Home / Self Care | Payer: Medicare HMO | Source: Ambulatory Visit | Attending: Orthopedic Surgery | Admitting: Orthopedic Surgery

## 2022-01-20 ENCOUNTER — Encounter (HOSPITAL_COMMUNITY): Payer: Self-pay

## 2022-01-20 VITALS — BP 151/73 | HR 57 | Temp 98.0°F | Resp 14 | Ht <= 58 in | Wt 164.0 lb

## 2022-01-20 DIAGNOSIS — Z7901 Long term (current) use of anticoagulants: Secondary | ICD-10-CM | POA: Insufficient documentation

## 2022-01-20 DIAGNOSIS — Z01812 Encounter for preprocedural laboratory examination: Secondary | ICD-10-CM | POA: Diagnosis not present

## 2022-01-20 DIAGNOSIS — M1711 Unilateral primary osteoarthritis, right knee: Secondary | ICD-10-CM | POA: Diagnosis not present

## 2022-01-20 DIAGNOSIS — Z01818 Encounter for other preprocedural examination: Secondary | ICD-10-CM

## 2022-01-20 HISTORY — DX: Hypothyroidism, unspecified: E03.9

## 2022-01-20 HISTORY — DX: Chronic kidney disease, unspecified: N18.9

## 2022-01-20 LAB — CBC
HCT: 31.9 % — ABNORMAL LOW (ref 36.0–46.0)
Hemoglobin: 10.1 g/dL — ABNORMAL LOW (ref 12.0–15.0)
MCH: 31.5 pg (ref 26.0–34.0)
MCHC: 31.7 g/dL (ref 30.0–36.0)
MCV: 99.4 fL (ref 80.0–100.0)
Platelets: 213 10*3/uL (ref 150–400)
RBC: 3.21 MIL/uL — ABNORMAL LOW (ref 3.87–5.11)
RDW: 13.2 % (ref 11.5–15.5)
WBC: 5.2 10*3/uL (ref 4.0–10.5)
nRBC: 0 % (ref 0.0–0.2)

## 2022-01-20 LAB — COMPREHENSIVE METABOLIC PANEL
ALT: 13 U/L (ref 0–44)
AST: 20 U/L (ref 15–41)
Albumin: 3.6 g/dL (ref 3.5–5.0)
Alkaline Phosphatase: 45 U/L (ref 38–126)
Anion gap: 9 (ref 5–15)
BUN: 19 mg/dL (ref 8–23)
CO2: 24 mmol/L (ref 22–32)
Calcium: 9.4 mg/dL (ref 8.9–10.3)
Chloride: 106 mmol/L (ref 98–111)
Creatinine, Ser: 1.09 mg/dL — ABNORMAL HIGH (ref 0.44–1.00)
GFR, Estimated: 53 mL/min — ABNORMAL LOW (ref >60)
Glucose, Bld: 95 mg/dL (ref 70–99)
Potassium: 4.3 mmol/L (ref 3.5–5.1)
Sodium: 139 mmol/L (ref 135–145)
Total Bilirubin: 0.5 mg/dL (ref 0.3–1.2)
Total Protein: 6.5 g/dL (ref 6.5–8.1)

## 2022-01-20 LAB — SURGICAL PCR SCREEN
MRSA, PCR: NEGATIVE
Staphylococcus aureus: NEGATIVE

## 2022-01-20 LAB — GLUCOSE, CAPILLARY: Glucose-Capillary: 74 mg/dL (ref 70–99)

## 2022-01-25 DIAGNOSIS — I129 Hypertensive chronic kidney disease with stage 1 through stage 4 chronic kidney disease, or unspecified chronic kidney disease: Secondary | ICD-10-CM

## 2022-01-25 DIAGNOSIS — J4520 Mild intermittent asthma, uncomplicated: Secondary | ICD-10-CM

## 2022-01-25 DIAGNOSIS — I480 Paroxysmal atrial fibrillation: Secondary | ICD-10-CM | POA: Diagnosis not present

## 2022-01-25 DIAGNOSIS — M1711 Unilateral primary osteoarthritis, right knee: Secondary | ICD-10-CM

## 2022-01-25 DIAGNOSIS — D508 Other iron deficiency anemias: Secondary | ICD-10-CM

## 2022-01-25 DIAGNOSIS — E1122 Type 2 diabetes mellitus with diabetic chronic kidney disease: Secondary | ICD-10-CM

## 2022-01-25 DIAGNOSIS — M0579 Rheumatoid arthritis with rheumatoid factor of multiple sites without organ or systems involvement: Secondary | ICD-10-CM | POA: Diagnosis not present

## 2022-01-25 DIAGNOSIS — N183 Chronic kidney disease, stage 3 unspecified: Secondary | ICD-10-CM | POA: Diagnosis not present

## 2022-01-25 DIAGNOSIS — N1831 Chronic kidney disease, stage 3a: Secondary | ICD-10-CM | POA: Diagnosis not present

## 2022-01-27 ENCOUNTER — Telehealth: Payer: Self-pay

## 2022-01-27 NOTE — Chronic Care Management (AMB) (Signed)
    Called [name], No answer, left message of appointment on 01-27-2022 at 12:00via telephone visit with [name], Pharm D. Notified to have all medications, supplements, blood pressure and/or blood sugar logs available during appointment and to return call if need to reschedule.   [place] Pharmacist Assistant [phone]

## 2022-01-28 ENCOUNTER — Telehealth: Payer: Self-pay

## 2022-01-28 ENCOUNTER — Telehealth: Payer: Medicare HMO

## 2022-01-28 NOTE — Progress Notes (Deleted)
Chronic Care Management Pharmacy Note  01/28/2022 Name:  Hannah [name] MRN:  [id number] DOB:  05-Aug-1946  Summary: ***  Recommendations/Changes made from today's visit: ***  Plan: ***   Subjective: Hannah [name] is an 76 y.o. year old female who is a primary patient of Glendale Chard, MD.  The CCM team was consulted for assistance with disease management and care coordination needs.    {CCMTELEPHONEFACETOFACE:21091510} for {CCMINITIALFOLLOWUPCHOICE:21091511} in response to provider referral for pharmacy case management and/or care coordination services.   Consent to Services:  {CCMCONSENTOPTIONS:25074}  Patient Care Team: Glendale Chard, MD as PCP - General (Internal Medicine) Nahser, Wonda Cheng, MD as PCP - Cardiology (Cardiology) Rex Kras Claudette Stapler, RN as [place] Management Caudill, Kennieth Francois, [facility] (Inactive) (Pharmacist)  Recent office visits: ***  Recent consult visits: [facility] visits: {Hospital DC Yes/No:25215}   Objective:  Lab Results  Component Value Date   CREATININE 1.09 (H) 01/20/2022   BUN 19 01/20/2022   EGFR 46 (L) 12/31/2021   GFRNONAA 53 (L) 01/20/2022   GFRAA 44 (L) 12/19/2020   NA 139 01/20/2022   K 4.3 01/20/2022   CALCIUM 9.4 01/20/2022   CO2 24 01/20/2022   GLUCOSE 95 01/20/2022    Lab Results  Component Value Date/Time   HGBA1C 5.5 12/31/2021 11:44 AM   HGBA1C 5.8 (H) 10/15/2021 03:21 PM   MICROALBUR 150 12/30/2020 01:02 PM   MICROALBUR 80 10/31/2019 12:13 PM    Last diabetic Eye exam:  Lab Results  Component Value Date/Time   HMDIABEYEEXA No Retinopathy 09/18/2021 12:00 AM    Last diabetic Foot exam: No results found for: HMDIABFOOTEX   Lab Results  Component Value Date   CHOL 126 12/31/2021   HDL 57 12/31/2021   LDLCALC 60 12/31/2021   TRIG 30 12/31/2021   CHOLHDL 2.2 12/31/2021       Latest Ref Rng & Units 01/20/2022    8:17 AM 12/31/2021   11:44 AM 11/24/2021   10:29 AM  Hepatic  Function  Total Protein 6.5 - 8.1 g/dL 6.5   6.7   6.8    Albumin 3.5 - 5.0 g/dL 3.6   4.3     AST 15 - 41 U/L 20   24   18     ALT 0 - 44 U/L 13   14   11     Alk Phosphatase 38 - 126 U/L 45   59     Total Bilirubin 0.3 - 1.2 mg/dL 0.5   0.5   0.5      Lab Results  Component Value Date/Time   TSH 1.320 12/31/2021 11:44 AM   TSH 1.020 06/25/2021 12:17 PM   FREET4 1.44 12/31/2021 11:44 AM   FREET4 1.43 06/25/2021 12:17 PM       Latest Ref Rng & Units 01/20/2022    8:17 AM 12/31/2021   11:44 AM 11/24/2021   10:29 AM  CBC  WBC 4.0 - 10.5 K/uL 5.2   6.1   4.7    Hemoglobin 12.0 - 15.0 g/dL 10.1   10.6   10.7    Hematocrit 36.0 - 46.0 % 31.9   31.9   33.3    Platelets 150 - 400 K/uL 213   145   200      Lab Results  Component Value Date/Time   VD25OH 42 11/24/2021 10:29 AM   VD25OH 41 05/23/2021 01:33 PM    Clinical ASCVD: {YES/NO:21197} The ASCVD Risk score (Arnett DK, et  al., 2019) failed to calculate for the following reasons:   The valid total cholesterol range is 130 to 320 mg/dL       06/25/2021   11:18 AM 06/19/2020   11:27 AM 06/19/2020   10:53 AM  Depression screen PHQ 2/9  Decreased Interest 0 0 0  Down, Depressed, Hopeless 0 0 0  PHQ - 2 Score 0 0 0     ***Other: (CHADS2VASc if Afib, MMRC or CAT for COPD, ACT, DEXA)  Social History   Tobacco Use  Smoking Status Never   Passive exposure: Never  Smokeless Tobacco Never   BP Readings from Last 3 Encounters:  01/20/22 (!) 151/73  12/31/21 124/70  12/10/21 (!) 144/81   Pulse Readings from Last 3 Encounters:  01/20/22 (!) 57  12/31/21 75  12/10/21 67   Wt Readings from Last 3 Encounters:  01/20/22 164 lb (74.4 kg)  12/31/21 169 lb (76.7 kg)  11/24/21 170 lb 3.2 oz (77.2 kg)   BMI Readings from Last 3 Encounters:  01/20/22 34.28 kg/m  12/31/21 36.57 kg/m  11/24/21 36.83 kg/m    Assessment/Interventions: Review of patient past medical history, allergies, medications, health status, including  review of consultants reports, laboratory and other test data, was performed as part of comprehensive evaluation and provision of chronic care management services.   SDOH:  (Social Determinants of Health) assessments and interventions performed: {yes/no:20286}  SDOH Screenings   Alcohol Screen: Low Risk    Last Alcohol Screening Score (AUDIT): 1  Depression (PHQ2-9): Low Risk    PHQ-2 Score: 0  Financial Resource Strain: Low Risk    Difficulty of Paying Living Expenses: Not hard at all  Food Insecurity: No Food Insecurity   Worried About Charity fundraiser in the Last Year: Never true   Ran Out of Food in the Last Year: Never true  Housing: Low Risk    Last Housing Risk Score: 0  Physical Activity: Sufficiently Active   Days of Exercise per Week: 5 days   Minutes of Exercise per Session: 40 min  Social Connections: Moderately Integrated   Frequency of Communication with Friends and Family: More than three times a week   Frequency of Social Gatherings with Friends and Family: More than three times a week   Attends Religious Services: More than 4 times per year   Active Member of [employer] or Organizations: Yes   Attends Archivist Meetings: 1 to 4 times per year   Marital Status: Never married  Stress: Stress Concern Present   Feeling of Stress : To some extent  Tobacco Use: Low Risk    Smoking Tobacco Use: Never   Smokeless Tobacco Use: Never   Passive Exposure: Never  Transportation Needs: No Transportation Needs   Lack of Transportation (Medical): No   Lack of Transportation (Non-Medical): No    CCM Care Plan  Allergies  Allergen Reactions   Shellfish Allergy Anaphylaxis and Hives    Medications Reviewed Today     Reviewed by Willeen Cass, RN (Registered Nurse) on 01/20/22 at [phone]  Med List Status: <None>   Medication Order Taking? Sig Documenting Provider Last Dose Status Informant  Accu-Chek Softclix Lancets lancets [id number]  Use to check blood  sugars daily E11.69 Glendale Chard, MD  Active Self  albuterol (PROVENTIL HFA;VENTOLIN HFA) 108 (90 Base) MCG/ACT inhaler [id number]  Inhale 2 puffs into the lungs every 6 (six) hours as needed for wheezing or shortness of breath. Minette Brine, FNP  Active Self  Alcohol Swabs (ALCOHOL PADS) 70 % PADS [id number]  Use as directed to check blood sugars 1 time per day dx: e11.22 Glendale Chard, MD  Active Self  allopurinol (ZYLOPRIM) 100 MG tablet [id number]  TAKE 1 TABLET BY MOUTH EVERY DAY FOR GOUT Glendale Chard, MD  Active Self  amLODipine (NORVASC) 2.5 MG tablet [id number]  TAKE 1 TABLET(2.5 MG) BY MOUTH DAILY Glendale Chard, MD  Active Self  apixaban (ELIQUIS) 5 MG TABS tablet [id number]  Take 1 tablet (5 mg total) by mouth 2 (two) times daily. Nahser, Wonda Cheng, MD  Active Self  Ascorbic Acid (VITAMIN C) 1000 MG tablet [id number]  Take 1,000 mg by mouth daily. [provider]  Active Self  atorvastatin (LIPITOR) 80 MG tablet [id number]  TAKE 1 TABLET BY MOUTH EVERY DAY Glendale Chard, MD  Active Self  budesonide-formoterol [facility]) 160-4.5 MCG/ACT inhaler [id number]  INHALE 2 PUFFS BY MOUTH TWICE DAILY IN THE MORNING AND IN THE EVENING  Patient taking differently: Inhale 2 puffs into the lungs 2 (two) times daily as needed (asthma).   Glendale Chard, MD  Active Self  Calcium Carb-Cholecalciferol 600-800 MG-UNIT TABS [id number]  Take 1 tablet by mouth daily.  [provider]  Active Self  Cholecalciferol (VITAMIN D PO) [id number]  Take 2,000 Units by mouth daily. [provider]  Active Self  cyanocobalamin 1000 MCG tablet [id number]  Take 1,000 mcg by mouth daily. [provider]  Active Self           Med Note Wilmon Pali, MELISSA R   Wed Jan 14, 2022 11:59 AM) B12  denosumab (PROLIA) 60 MG/ML SOSY injection [id number]  Inject 60 mg into the skin every 6 (six) months. Courier to rheum: [address], [place], [place] [address]. Appt on 12/10/21 Ofilia Neas, PA-C   Active Self  glucose blood (ACCU-CHEK GUIDE) test strip [id number]  Use to check blood sugars daily E11.69 Glendale Chard, MD  Active Self  hydroxychloroquine (PLAQUENIL) 200 MG tablet [id number]  TAKE 1 TABLET(200 MG) BY MOUTH DAILY Ofilia Neas, PA-C  Active Self  Hypromellose (ARTIFICIAL TEARS OP) [id number]  Apply 1 drop to eye daily as needed (dry eyes). [provider]  Active Self  irbesartan (AVAPRO) 300 MG tablet [id number]  TAKE 1 TABLET(300 MG) BY MOUTH DAILY Glendale Chard, MD  Active Self  iron polysaccharides (NIFEREX) 150 MG capsule [id number]  Take 150 mg by mouth daily. [provider]  Active Self           Med Note Rex Kras, Claudette Stapler   Fri Nov 01, 2020  1:05 PM)    [facility]) [id number]  Apply 1 application topically daily as needed (pain). [provider]  Active Self  metoprolol succinate (TOPROL-XL) 25 MG 24 hr tablet [id number]  Take 1 tablet (25 mg total) by mouth daily. Nahser, Wonda Cheng, MD  Active Self  SYNTHROID 88 MCG tablet [id number]  TAKE 1 TABLET BY MOUTH EVERY DAY MONDAY TO SATURDAY AND OFF ON Nemiah Commander, MD  Active Self  valsartan-hydrochlorothiazide (DIOVAN-HCT) 160-25 MG tablet [id number]  Take 1 tablet by mouth daily. [provider]  Active Self            Patient Active Problem List   Diagnosis Date Noted   Chronic bilateral low back pain without sciatica 06/25/2021   Paresthesia and pain of extremity 06/25/2021   Atrial fibrillation ([place]) 04/16/2021   Demand ischemia ([place]) 03/26/2021   Unspecified atrial fibrillation ([place])  03/26/2021   COVID-19 virus infection 03/26/2021   PVC (premature ventricular contraction) 05/06/2020   Type 2 diabetes mellitus with stage 3 chronic kidney disease, without long-term current use of insulin ([place]) 03/20/2020   Palpitations 03/20/2020   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/2020   Primary hypothyroidism 03/20/2020   Vitiligo 03/20/2020   Iron deficiency anemia  08/12/2017   Osteoporosis 10/02/2016   Systemic lupus erythematosus ([place]) 10/02/2016   Rheumatoid arthritis involving multiple sites with positive rheumatoid factor ([place]) 10/02/2016   High risk medication use 10/02/2016   History of chronic kidney disease 10/02/2016   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/2016   Primary osteoarthritis of both knees 10/02/2016   Vitamin D deficiency 10/02/2016   History of diabetes mellitus 10/02/2016   History of hypertension 10/02/2016   History of asthma 10/02/2016   Absolute anemia    MGUS (monoclonal gammopathy of unknown significance)    Normocytic anemia 03/21/2015   Essential hypertension 03/20/2015   Abnormal CT of the chest 07/30/2012   Asthma 06/19/2012    Immunization History  Administered Date(s) Administered   19-influenza Whole 07/15/2012   DTaP 06/18/2019   Fluad Quad(high Dose 65+) 06/19/2020, 06/03/2021   Influenza Split 06/28/2014   Influenza, High Dose Seasonal PF 05/18/2019   Influenza,inj,Quad PF,6+ Mos 06/20/2018   Moderna SARS-COV2 Booster Vaccination 01/01/2021   Moderna Sars-Covid-2 Vaccination 11/20/2019, 12/19/2019, 08/06/2020, 01/01/2021, 08/13/2021   Pneumococcal Conjugate-13 05/04/2018   Pneumococcal Polysaccharide-23 05/18/2019   Pneumococcal-Unspecified 06/28/2014   Tdap 06/15/2019   Zoster Recombinat (Shingrix) 02/19/2021, 04/24/2021    Conditions to be addressed/monitored:  {USCCMDZASSESSMENTOPTIONS:23563}  There are no care plans that you recently modified to display for this patient.    Medication Assistance: {MEDASSISTANCEINFO:25044}  Compliance/Adherence/Medication fill history: Care Gaps: ***  Star-Rating Drugs: ***  Patient's preferred pharmacy is:  [facility] [place], [place] - [address] AT [place] [address] Phone: [phone] Fax: [place]  [place] [address] Phone: [phone] Fax: [phone]  [id number] - [address], [place] [address] Phone: [phone] Fax: [phone]  Uses pill box? {Yes or If no, why not?:20788} Pt endorses ***% compliance  We discussed: {Pharmacy options:24294} Patient decided to: {US Pharmacy Plan:23885}  Care Plan and Follow Up Patient Decision:  {FOLLOWUP:24991}  Plan: {CM FOLLOW UP PIRJ:18841}  ***  Current Barriers:  {pharmacybarriers:24917}  Pharmacist Clinical Goal(s):  Patient will {PHARMACYGOALCHOICES:24921} through collaboration with PharmD and provider.   Interventions: 1:1 collaboration with Glendale Chard, MD regarding development and update of comprehensive plan of care as evidenced by provider attestation and co-signature Inter-disciplinary care team collaboration (see longitudinal plan of care) Comprehensive medication review performed; medication list updated in electronic medical record  {CCM PHARMD DISEASE STATES:25130}  Patient Goals/Self-Care Activities Patient will:  - {pharmacypatientgoals:24919}  Follow Up Plan: {CM FOLLOW UP YSAY:30160}

## 2022-01-28 NOTE — Telephone Encounter (Signed)
?  Care Management  ? ?Pharmacist Follow Up Note ? ? ?01/28/2022 ?Name: Hannah [name] MRN: [id number] DOB: 02-05-1946 ? ? ?Referred by: Glendale Chard, MD ?Reason for referral : No chief complaint on file. ? ? ?An unsuccessful telephone outreach was attempted today. The patient was referred to the case management team for assistance with care management and care coordination.  Called patient twice left non descriptive voicemail with phone number to practice.  ? ?Follow Up Plan: The care management team will reach out to the patient again over the next 12 days.  Patient to have an appointment with CCM RN Glenard Haring.  ? ?[name], CPP, PharmD ?Clinical Pharmacist Practitioner ?[facility] ?[phone] ? ? ?

## 2022-01-28 NOTE — Chronic Care Management (AMB) (Signed)
01/28/2022- Called patient to follow up on missed call for Pharm D visit today. Called cell, goes straight to voicemail, left message of outreach to patient from [name] and that her Synthroid medication has arrived to the office. Called home number patient answered, she was on her cell phone just now and recently got off, did not see a call from [name] on home number and voicemail has not come up yet on cell. Patient stated she would not like her cell phone called and would only like communcation through her home number or mychart message. Send message to Clinical team to update demographics for patient. Patient aware Synthroid medication has arrived to office and ready for pick up, patient will be by today. Patient also wanted to send a correspondence to to [facility] assist giving them permission to send medication to patients home. Patient aware we will get that faxed over. Rescheduling CCM f/u appointment to 03/10/2022 @ 9 am, patient is having Knee surgery next week and will be unable to have any other visit but Ortho visit for about a month.   Pattricia Boss, [place] Pharmacist Assistant [phone]

## 2022-01-29 ENCOUNTER — Other Ambulatory Visit (HOSPITAL_COMMUNITY): Payer: Medicare HMO

## 2022-01-30 DIAGNOSIS — M9905 Segmental and somatic dysfunction of pelvic region: Secondary | ICD-10-CM | POA: Diagnosis not present

## 2022-01-30 DIAGNOSIS — M9903 Segmental and somatic dysfunction of lumbar region: Secondary | ICD-10-CM | POA: Diagnosis not present

## 2022-01-30 DIAGNOSIS — M9902 Segmental and somatic dysfunction of thoracic region: Secondary | ICD-10-CM | POA: Diagnosis not present

## 2022-01-30 DIAGNOSIS — M47814 Spondylosis without myelopathy or radiculopathy, thoracic region: Secondary | ICD-10-CM | POA: Diagnosis not present

## 2022-01-30 DIAGNOSIS — M5137 Other intervertebral disc degeneration, lumbosacral region: Secondary | ICD-10-CM | POA: Diagnosis not present

## 2022-01-30 DIAGNOSIS — M5136 Other intervertebral disc degeneration, lumbar region: Secondary | ICD-10-CM | POA: Diagnosis not present

## 2022-02-02 ENCOUNTER — Encounter (HOSPITAL_COMMUNITY): Payer: Self-pay | Admitting: Orthopedic Surgery

## 2022-02-02 ENCOUNTER — Ambulatory Visit (HOSPITAL_BASED_OUTPATIENT_CLINIC_OR_DEPARTMENT_OTHER): Payer: Medicare HMO | Admitting: Anesthesiology

## 2022-02-02 ENCOUNTER — Ambulatory Visit (HOSPITAL_COMMUNITY): Payer: Medicare HMO | Admitting: Physician Assistant

## 2022-02-02 ENCOUNTER — Observation Stay (HOSPITAL_COMMUNITY)
Admission: RE | Admit: 2022-02-02 | Discharge: 2022-02-03 | Disposition: A | Discharge status: Home / Self Care | Payer: Medicare HMO | Attending: Orthopedic Surgery | Admitting: Orthopedic Surgery

## 2022-02-02 ENCOUNTER — Other Ambulatory Visit: Payer: Self-pay

## 2022-02-02 ENCOUNTER — Encounter (HOSPITAL_COMMUNITY)
Admission: RE | Disposition: A | Discharge status: Home / Self Care | Payer: Self-pay | Source: Home / Self Care | Attending: Orthopedic Surgery

## 2022-02-02 DIAGNOSIS — Z8616 Personal history of COVID-19: Secondary | ICD-10-CM | POA: Insufficient documentation

## 2022-02-02 DIAGNOSIS — E1122 Type 2 diabetes mellitus with diabetic chronic kidney disease: Secondary | ICD-10-CM | POA: Insufficient documentation

## 2022-02-02 DIAGNOSIS — Z79899 Other long term (current) drug therapy: Secondary | ICD-10-CM | POA: Insufficient documentation

## 2022-02-02 DIAGNOSIS — E209 Hypoparathyroidism, unspecified: Secondary | ICD-10-CM | POA: Insufficient documentation

## 2022-02-02 DIAGNOSIS — Z7901 Long term (current) use of anticoagulants: Secondary | ICD-10-CM | POA: Insufficient documentation

## 2022-02-02 DIAGNOSIS — N183 Chronic kidney disease, stage 3 unspecified: Secondary | ICD-10-CM | POA: Diagnosis not present

## 2022-02-02 DIAGNOSIS — I129 Hypertensive chronic kidney disease with stage 1 through stage 4 chronic kidney disease, or unspecified chronic kidney disease: Secondary | ICD-10-CM | POA: Insufficient documentation

## 2022-02-02 DIAGNOSIS — I4891 Unspecified atrial fibrillation: Secondary | ICD-10-CM | POA: Insufficient documentation

## 2022-02-02 DIAGNOSIS — M1712 Unilateral primary osteoarthritis, left knee: Secondary | ICD-10-CM

## 2022-02-02 DIAGNOSIS — E119 Type 2 diabetes mellitus without complications: Secondary | ICD-10-CM | POA: Diagnosis not present

## 2022-02-02 DIAGNOSIS — J45909 Unspecified asthma, uncomplicated: Secondary | ICD-10-CM | POA: Diagnosis not present

## 2022-02-02 DIAGNOSIS — I10 Essential (primary) hypertension: Secondary | ICD-10-CM

## 2022-02-02 DIAGNOSIS — G8918 Other acute postprocedural pain: Secondary | ICD-10-CM | POA: Diagnosis not present

## 2022-02-02 DIAGNOSIS — M179 Osteoarthritis of knee, unspecified: Secondary | ICD-10-CM

## 2022-02-02 DIAGNOSIS — M1711 Unilateral primary osteoarthritis, right knee: Secondary | ICD-10-CM

## 2022-02-02 HISTORY — PX: TOTAL KNEE ARTHROPLASTY: SHX125

## 2022-02-02 LAB — PROTIME-INR
INR: 1 (ref 0.8–1.2)
Prothrombin Time: 13.5 seconds (ref 11.4–15.2)

## 2022-02-02 LAB — APTT: aPTT: 26 seconds (ref 24–36)

## 2022-02-02 LAB — GLUCOSE, CAPILLARY
Glucose-Capillary: 94 mg/dL (ref 70–99)
Glucose-Capillary: 88 mg/dL (ref 70–99)

## 2022-02-02 SURGERY — ARTHROPLASTY, KNEE, TOTAL
Anesthesia: Regional | Site: Knee | Laterality: Left

## 2022-02-02 MED ORDER — PHENOL 1.4 % MT LIQD: 1.0000 | OROMUCOSAL | Status: DC | PRN

## 2022-02-02 MED ORDER — CEFAZOLIN SODIUM-DEXTROSE 2-4 GM/100ML-% IV SOLN
2.0000 g | Freq: Four times a day (QID) | INTRAVENOUS | Status: AC
  Administered 2022-02-02 (×2): 2 g via INTRAVENOUS
  Filled 2022-02-02 (×2): qty 100

## 2022-02-02 MED ORDER — SODIUM CHLORIDE 0.9 % IV SOLN: INTRAVENOUS | Status: DC

## 2022-02-02 MED ORDER — POLYETHYLENE GLYCOL 3350 17 G PO PACK: 17.0000 g | PACK | Freq: Every day | ORAL | Status: DC | PRN

## 2022-02-02 MED ORDER — EPHEDRINE SULFATE (PRESSORS) 50 MG/ML IJ SOLN
INTRAMUSCULAR | Status: DC | PRN
  Administered 2022-02-02: 10 mg via INTRAVENOUS

## 2022-02-02 MED ORDER — PROPOFOL 1000 MG/100ML IV EMUL
INTRAVENOUS | Status: AC
  Filled 2022-02-02: qty 200

## 2022-02-02 MED ORDER — ONDANSETRON HCL 4 MG/2ML IJ SOLN: 4.0000 mg | Freq: Once | INTRAMUSCULAR | Status: DC | PRN

## 2022-02-02 MED ORDER — FLEET ENEMA 7-19 GM/118ML RE ENEM: 1.0000 | ENEMA | Freq: Once | RECTAL | Status: DC | PRN

## 2022-02-02 MED ORDER — CHLORHEXIDINE GLUCONATE 0.12 % MT SOLN
15.0000 mL | Freq: Once | OROMUCOSAL | Status: AC
  Administered 2022-02-02: 15 mL via OROMUCOSAL

## 2022-02-02 MED ORDER — METHOCARBAMOL 500 MG IVPB - SIMPLE MED
500.0000 mg | Freq: Four times a day (QID) | INTRAVENOUS | Status: DC | PRN
  Filled 2022-02-02: qty 50

## 2022-02-02 MED ORDER — ATORVASTATIN CALCIUM 40 MG PO TABS
80.0000 mg | ORAL_TABLET | Freq: Every day | ORAL | Status: DC
  Administered 2022-02-03: 80 mg via ORAL
  Filled 2022-02-02: qty 2

## 2022-02-02 MED ORDER — ONDANSETRON HCL 4 MG PO TABS: 4.0000 mg | ORAL_TABLET | Freq: Four times a day (QID) | ORAL | Status: DC | PRN

## 2022-02-02 MED ORDER — FENTANYL CITRATE PF 50 MCG/ML IJ SOSY: 25.0000 ug | PREFILLED_SYRINGE | INTRAMUSCULAR | Status: DC | PRN

## 2022-02-02 MED ORDER — BUPIVACAINE LIPOSOME 1.3 % IJ SUSP
INTRAMUSCULAR | Status: AC
  Filled 2022-02-02: qty 20

## 2022-02-02 MED ORDER — DEXAMETHASONE SODIUM PHOSPHATE 10 MG/ML IJ SOLN
8.0000 mg | Freq: Once | INTRAMUSCULAR | Status: AC
  Administered 2022-02-02: 10 mg via INTRAVENOUS

## 2022-02-02 MED ORDER — ONDANSETRON HCL 4 MG/2ML IJ SOLN: 4.0000 mg | Freq: Four times a day (QID) | INTRAMUSCULAR | Status: DC | PRN

## 2022-02-02 MED ORDER — METOCLOPRAMIDE HCL 5 MG PO TABS: 5.0000 mg | ORAL_TABLET | Freq: Three times a day (TID) | ORAL | Status: DC | PRN

## 2022-02-02 MED ORDER — BUPIVACAINE-EPINEPHRINE (PF) 0.5% -1:200000 IJ SOLN
INTRAMUSCULAR | Status: DC | PRN
  Administered 2022-02-02: 30 mL via PERINEURAL

## 2022-02-02 MED ORDER — CEFAZOLIN SODIUM-DEXTROSE 2-4 GM/100ML-% IV SOLN
2.0000 g | INTRAVENOUS | Status: AC
  Administered 2022-02-02: 2 g via INTRAVENOUS
  Filled 2022-02-02: qty 100

## 2022-02-02 MED ORDER — DIPHENHYDRAMINE HCL 12.5 MG/5ML PO ELIX: 12.5000 mg | ORAL_SOLUTION | ORAL | Status: DC | PRN

## 2022-02-02 MED ORDER — ONDANSETRON HCL 4 MG/2ML IJ SOLN
INTRAMUSCULAR | Status: DC | PRN
  Administered 2022-02-02: 4 mg via INTRAVENOUS

## 2022-02-02 MED ORDER — APIXABAN 2.5 MG PO TABS
2.5000 mg | ORAL_TABLET | Freq: Two times a day (BID) | ORAL | Status: DC
  Administered 2022-02-03: 2.5 mg via ORAL
  Filled 2022-02-02: qty 1

## 2022-02-02 MED ORDER — ALBUTEROL SULFATE HFA 108 (90 BASE) MCG/ACT IN AERS: 2.0000 | INHALATION_SPRAY | Freq: Four times a day (QID) | RESPIRATORY_TRACT | Status: DC | PRN

## 2022-02-02 MED ORDER — ACETAMINOPHEN 10 MG/ML IV SOLN
1000.0000 mg | Freq: Once | INTRAVENOUS | Status: AC
  Administered 2022-02-02: 1000 mg via INTRAVENOUS
  Filled 2022-02-02: qty 100

## 2022-02-02 MED ORDER — DOCUSATE SODIUM 100 MG PO CAPS
100.0000 mg | ORAL_CAPSULE | Freq: Two times a day (BID) | ORAL | Status: DC
  Administered 2022-02-02 – 2022-02-03 (×2): 100 mg via ORAL
  Filled 2022-02-02 (×2): qty 1

## 2022-02-02 MED ORDER — TRANEXAMIC ACID-NACL 1000-0.7 MG/100ML-% IV SOLN
1000.0000 mg | INTRAVENOUS | Status: AC
  Administered 2022-02-02: 1000 mg via INTRAVENOUS
  Filled 2022-02-02: qty 100

## 2022-02-02 MED ORDER — BUPIVACAINE LIPOSOME 1.3 % IJ SUSP: 20.0000 mL | Freq: Once | INTRAMUSCULAR | Status: DC

## 2022-02-02 MED ORDER — POVIDONE-IODINE 10 % EX SWAB: 2.0000 "application " | Freq: Once | CUTANEOUS | Status: DC

## 2022-02-02 MED ORDER — SODIUM CHLORIDE (PF) 0.9 % IJ SOLN
INTRAMUSCULAR | Status: AC
  Filled 2022-02-02: qty 10

## 2022-02-02 MED ORDER — LACTATED RINGERS IV SOLN: INTRAVENOUS | Status: DC

## 2022-02-02 MED ORDER — FLUTICASONE FUROATE-VILANTEROL 200-25 MCG/ACT IN AEPB
1.0000 | INHALATION_SPRAY | Freq: Every day | RESPIRATORY_TRACT | Status: DC | PRN
  Filled 2022-02-02: qty 28

## 2022-02-02 MED ORDER — LEVOTHYROXINE SODIUM 88 MCG PO TABS
88.0000 ug | ORAL_TABLET | Freq: Every day | ORAL | Status: DC
  Administered 2022-02-03: 88 ug via ORAL
  Filled 2022-02-02: qty 1

## 2022-02-02 MED ORDER — MENTHOL 3 MG MT LOZG: 1.0000 | LOZENGE | OROMUCOSAL | Status: DC | PRN

## 2022-02-02 MED ORDER — VALSARTAN-HYDROCHLOROTHIAZIDE 160-25 MG PO TABS: 1.0000 | ORAL_TABLET | Freq: Every day | ORAL | Status: DC

## 2022-02-02 MED ORDER — MUPIROCIN 2 % EX OINT
1.0000 | TOPICAL_OINTMENT | Freq: Once | CUTANEOUS | Status: AC
Start: 2022-02-02 — End: 2022-02-02
  Administered 2022-02-02: 1 via TOPICAL
  Filled 2022-02-02: qty 22

## 2022-02-02 MED ORDER — IRBESARTAN 150 MG PO TABS
300.0000 mg | ORAL_TABLET | Freq: Every day | ORAL | Status: DC
Start: 2022-02-03 — End: 2022-02-03
  Administered 2022-02-03: 300 mg via ORAL
  Filled 2022-02-02: qty 2

## 2022-02-02 MED ORDER — METOCLOPRAMIDE HCL 5 MG/ML IJ SOLN: 5.0000 mg | Freq: Three times a day (TID) | INTRAMUSCULAR | Status: DC | PRN

## 2022-02-02 MED ORDER — ORAL CARE MOUTH RINSE: 15.0000 mL | Freq: Once | OROMUCOSAL | Status: AC

## 2022-02-02 MED ORDER — TRAMADOL HCL 50 MG PO TABS
50.0000 mg | ORAL_TABLET | Freq: Four times a day (QID) | ORAL | Status: DC | PRN
  Administered 2022-02-03: 100 mg via ORAL
  Filled 2022-02-02: qty 2

## 2022-02-02 MED ORDER — SODIUM CHLORIDE (PF) 0.9 % IJ SOLN
INTRAMUSCULAR | Status: DC | PRN
Start: 2022-02-02 — End: 2022-02-02
  Administered 2022-02-02: 60 mL

## 2022-02-02 MED ORDER — MORPHINE SULFATE (PF) 2 MG/ML IV SOLN: 1.0000 mg | INTRAVENOUS | Status: DC | PRN

## 2022-02-02 MED ORDER — ALLOPURINOL 100 MG PO TABS
100.0000 mg | ORAL_TABLET | Freq: Every day | ORAL | Status: DC
  Administered 2022-02-03: 100 mg via ORAL
  Filled 2022-02-02: qty 1

## 2022-02-02 MED ORDER — BUPIVACAINE LIPOSOME 1.3 % IJ SUSP
INTRAMUSCULAR | Status: DC | PRN
  Administered 2022-02-02: 20 mL

## 2022-02-02 MED ORDER — ACETAMINOPHEN 500 MG PO TABS
1000.0000 mg | ORAL_TABLET | Freq: Four times a day (QID) | ORAL | Status: AC
  Administered 2022-02-02 – 2022-02-03 (×4): 1000 mg via ORAL
  Filled 2022-02-02 (×4): qty 2

## 2022-02-02 MED ORDER — BUPIVACAINE IN DEXTROSE 0.75-8.25 % IT SOLN
INTRATHECAL | Status: DC | PRN
  Administered 2022-02-02: 1.5 mL via INTRATHECAL

## 2022-02-02 MED ORDER — AMLODIPINE BESYLATE 5 MG PO TABS
2.5000 mg | ORAL_TABLET | Freq: Every day | ORAL | Status: DC
  Administered 2022-02-03: 2.5 mg via ORAL
  Filled 2022-02-02: qty 1

## 2022-02-02 MED ORDER — DEXAMETHASONE SODIUM PHOSPHATE 10 MG/ML IJ SOLN
10.0000 mg | Freq: Once | INTRAMUSCULAR | Status: AC
  Administered 2022-02-03: 10 mg via INTRAVENOUS
  Filled 2022-02-02: qty 1

## 2022-02-02 MED ORDER — PROPOFOL 500 MG/50ML IV EMUL
INTRAVENOUS | Status: DC | PRN
  Administered 2022-02-02: 80 ug/kg/min via INTRAVENOUS

## 2022-02-02 MED ORDER — OXYCODONE HCL 5 MG PO TABS
5.0000 mg | ORAL_TABLET | ORAL | Status: DC | PRN
  Administered 2022-02-02: 10 mg via ORAL
  Administered 2022-02-03: 5 mg via ORAL
  Filled 2022-02-02: qty 1
  Filled 2022-02-02: qty 2

## 2022-02-02 MED ORDER — FERROUS SULFATE 325 (65 FE) MG PO TABS
325.0000 mg | ORAL_TABLET | Freq: Two times a day (BID) | ORAL | Status: DC
  Administered 2022-02-03: 325 mg via ORAL
  Filled 2022-02-02: qty 1

## 2022-02-02 MED ORDER — SODIUM CHLORIDE 0.9 % IR SOLN
Status: DC | PRN
  Administered 2022-02-02: 2000 mL

## 2022-02-02 MED ORDER — BISACODYL 10 MG RE SUPP: 10.0000 mg | Freq: Every day | RECTAL | Status: DC | PRN

## 2022-02-02 MED ORDER — FENTANYL CITRATE (PF) 100 MCG/2ML IJ SOLN
INTRAMUSCULAR | Status: AC
  Filled 2022-02-02: qty 2

## 2022-02-02 MED ORDER — METOPROLOL SUCCINATE ER 25 MG PO TB24
25.0000 mg | ORAL_TABLET | Freq: Every day | ORAL | Status: DC
  Administered 2022-02-03: 25 mg via ORAL
  Filled 2022-02-02: qty 1

## 2022-02-02 MED ORDER — ALBUTEROL SULFATE (2.5 MG/3ML) 0.083% IN NEBU: 2.5000 mg | INHALATION_SOLUTION | Freq: Four times a day (QID) | RESPIRATORY_TRACT | Status: DC | PRN

## 2022-02-02 MED ORDER — METHOCARBAMOL 500 MG PO TABS
500.0000 mg | ORAL_TABLET | Freq: Four times a day (QID) | ORAL | Status: DC | PRN
  Administered 2022-02-02 – 2022-02-03 (×2): 500 mg via ORAL
  Filled 2022-02-02 (×2): qty 1

## 2022-02-02 MED ORDER — MIDAZOLAM HCL 2 MG/2ML IJ SOLN
INTRAMUSCULAR | Status: AC
  Filled 2022-02-02: qty 2

## 2022-02-02 MED ORDER — FENTANYL CITRATE PF 50 MCG/ML IJ SOSY
PREFILLED_SYRINGE | INTRAMUSCULAR | Status: AC
  Administered 2022-02-02: 50 ug
  Filled 2022-02-02: qty 1

## 2022-02-02 MED ORDER — STERILE WATER FOR IRRIGATION IR SOLN
Status: DC | PRN
  Administered 2022-02-02: 2000 mL

## 2022-02-02 MED ORDER — FENTANYL CITRATE (PF) 100 MCG/2ML IJ SOLN: INTRAMUSCULAR | Status: DC | PRN

## 2022-02-02 SURGICAL SUPPLY — 54 items
ATTUNE PS FEM LT SZ 3 CEM KNEE (Femur) ×1 IMPLANT
ATTUNE PSRP INSR SZ3 10 KNEE (Insert) ×1 IMPLANT
BAG COUNTER SPONGE SURGICOUNT (BAG) IMPLANT
BAG ZIPLOCK 12X15 (MISCELLANEOUS) ×2 IMPLANT
BASEPLATE TIBIAL ROTATING SZ 4 (Knees) ×1 IMPLANT
BLADE SAG 18X100X1.27 (BLADE) ×2 IMPLANT
BLADE SAW SGTL 11.0X1.19X90.0M (BLADE) ×2 IMPLANT
BNDG ELASTIC 6X5.8 VLCR STR LF (GAUZE/BANDAGES/DRESSINGS) ×2 IMPLANT
BOWL SMART MIX CTS (DISPOSABLE) ×2 IMPLANT
CEMENT HV SMART SET (Cement) ×4 IMPLANT
COVER SURGICAL LIGHT HANDLE (MISCELLANEOUS) ×2 IMPLANT
CUFF TOURN SGL QUICK 34 (TOURNIQUET CUFF) ×1
CUFF TRNQT CYL 34X4.125X (TOURNIQUET CUFF) ×1 IMPLANT
DRAPE INCISE IOBAN 66X45 STRL (DRAPES) ×2 IMPLANT
DRAPE U-SHAPE 47X51 STRL (DRAPES) ×2 IMPLANT
DRSG AQUACEL AG ADV 3.5X10 (GAUZE/BANDAGES/DRESSINGS) ×2 IMPLANT
DURAPREP 26ML APPLICATOR (WOUND CARE) ×2 IMPLANT
ELECT REM PT RETURN 15FT ADLT (MISCELLANEOUS) ×2 IMPLANT
GLOVE BIO SURGEON STRL SZ 6.5 (GLOVE) IMPLANT
GLOVE BIO SURGEON STRL SZ7.5 (GLOVE) IMPLANT
GLOVE BIO SURGEON STRL SZ8 (GLOVE) ×2 IMPLANT
GLOVE BIOGEL PI IND STRL 6.5 (GLOVE) IMPLANT
GLOVE BIOGEL PI IND STRL 7.0 (GLOVE) IMPLANT
GLOVE BIOGEL PI IND STRL 8 (GLOVE) ×1 IMPLANT
GLOVE BIOGEL PI INDICATOR 6.5 (GLOVE)
GLOVE BIOGEL PI INDICATOR 7.0 (GLOVE)
GLOVE BIOGEL PI INDICATOR 8 (GLOVE) ×1
GOWN STRL REUS W/ TWL LRG LVL3 (GOWN DISPOSABLE) ×1 IMPLANT
GOWN STRL REUS W/ TWL XL LVL3 (GOWN DISPOSABLE) IMPLANT
GOWN STRL REUS W/TWL LRG LVL3 (GOWN DISPOSABLE) ×1
GOWN STRL REUS W/TWL XL LVL3 (GOWN DISPOSABLE)
HANDPIECE INTERPULSE COAX TIP (DISPOSABLE) ×1
HOLDER FOLEY CATH W/STRAP (MISCELLANEOUS) IMPLANT
IMMOBILIZER KNEE 20 (SOFTGOODS) ×2
IMMOBILIZER KNEE 20 THIGH 36 (SOFTGOODS) ×1 IMPLANT
KIT TURNOVER KIT A (KITS) IMPLANT
MANIFOLD NEPTUNE II (INSTRUMENTS) ×2 IMPLANT
NS IRRIG 1000ML POUR BTL (IV SOLUTION) ×2 IMPLANT
PACK TOTAL KNEE CUSTOM (KITS) ×2 IMPLANT
PADDING CAST COTTON 6X4 STRL (CAST SUPPLIES) ×3 IMPLANT
PATELLA MEDIAL ATTUN 35MM KNEE (Knees) ×1 IMPLANT
PROTECTOR NERVE ULNAR (MISCELLANEOUS) ×2 IMPLANT
SET HNDPC FAN SPRY TIP SCT (DISPOSABLE) ×1 IMPLANT
SPIKE FLUID TRANSFER (MISCELLANEOUS) ×2 IMPLANT
STRIP CLOSURE SKIN 1/2X4 (GAUZE/BANDAGES/DRESSINGS) ×3 IMPLANT
SUT MNCRL AB 4-0 PS2 18 (SUTURE) ×2 IMPLANT
SUT STRATAFIX 0 PDS 27 VIOLET (SUTURE) ×2
SUT VIC AB 2-0 CT1 27 (SUTURE) ×3
SUT VIC AB 2-0 CT1 TAPERPNT 27 (SUTURE) ×3 IMPLANT
SUTURE STRATFX 0 PDS 27 VIOLET (SUTURE) ×1 IMPLANT
TRAY FOLEY MTR SLVR 16FR STAT (SET/KITS/TRAYS/PACK) ×2 IMPLANT
TUBE SUCTION HIGH CAP CLEAR NV (SUCTIONS) ×2 IMPLANT
WATER STERILE IRR 1000ML POUR (IV SOLUTION) ×4 IMPLANT
WRAP KNEE MAXI GEL POST OP (GAUZE/BANDAGES/DRESSINGS) ×2 IMPLANT

## 2022-02-02 NOTE — Discharge Instructions (Signed)
? ?Hannah Arabian, MD ?Total Joint Specialist ?[facility] ?[address] ?[place], [place] [address] ?[phone] ? ?TOTAL KNEE REPLACEMENT POSTOPERATIVE DIRECTIONS ? ? ? ?Knee Rehabilitation, Guidelines Following Surgery  ?Results after knee surgery are often greatly improved when you follow the exercise, range of motion and muscle strengthening exercises prescribed by your doctor. Safety measures are also important to protect the knee from further injury. If any of these exercises cause you to have increased pain or swelling in your knee joint, decrease the amount until you are comfortable again and slowly increase them. If you have problems or questions, call your caregiver or physical therapist for advice.  ? ?HOME CARE INSTRUCTIONS  ?Remove items at home which could result in a fall. This includes throw rugs or furniture in walking pathways.  ?ICE to the affected knee as much as tolerated. Icing helps control swelling. If the swelling is well controlled you will be more comfortable and rehab easier. Continue to use ice on the knee for pain and swelling from surgery. You may notice swelling that will progress down to the foot and ankle. This is normal after surgery. Elevate the leg when you are not up walking on it.    ?Continue to use the breathing machine which will help keep your temperature down. It is common for your temperature to cycle up and down following surgery, especially at night when you are not up moving around and exerting yourself. The breathing machine keeps your lungs expanded and your temperature down. ?Do not place pillow under the operative knee, focus on keeping the knee straight while resting ? ?DIET ?You may resume your previous home diet once you are discharged from the hospital. ? ?DRESSING / WOUND CARE / SHOWERING ?Keep your bulky bandage on for 2 days. On the third post-operative day you may remove the Ace bandage and gauze. There is a waterproof  adhesive bandage on your skin which will stay in place until your first follow-up appointment. Once you remove this you will not need to place another bandage ?You may begin showering 3 days following surgery, but do not submerge the incision under water. ? ?ACTIVITY ?For the first 5 days, the key is rest and control of pain and swelling ?Do your home exercises twice a day starting on post-operative day 3. On the days you go to physical therapy, just do the home exercises once that day. ?You should rest, ice and elevate the leg for 50 minutes out of every hour. Get up and walk/stretch for 10 minutes per hour. After 5 days you can increase your activity slowly as tolerated. ?Walk with your walker as instructed. Use the walker until you are comfortable transitioning to a cane. Walk with the cane in the opposite hand of the operative leg. You may discontinue the cane once you are comfortable and walking steadily. ?Avoid periods of inactivity such as sitting longer than an hour when not asleep. This helps prevent blood clots.  ?You may discontinue the knee immobilizer once you are able to perform a straight leg raise while lying down. ?You may resume a sexual relationship in one month or when given the OK by your doctor.  ?You may return to work once you are cleared by your doctor.  ?Do not drive a car for 6 weeks or until released by your surgeon.  ?Do not drive while taking narcotics. ? ?TED HOSE STOCKINGS ?Wear the elastic stockings on both legs for three weeks following surgery during the  day. You may remove them at night for sleeping. ? ?WEIGHT BEARING ?Weight bearing as tolerated with assist device (walker, cane, etc) as directed, use it as long as suggested by your surgeon or therapist, typically at least 4-6 weeks. ? ?POSTOPERATIVE CONSTIPATION PROTOCOL ?Constipation - defined medically as fewer than three stools per week and severe constipation as less than one stool per week. ? ?One of the most common issues  patients have following surgery is constipation.  Even if you have a regular bowel pattern at home, your normal regimen is likely to be disrupted due to multiple reasons following surgery.  Combination of anesthesia, postoperative narcotics, change in appetite and fluid intake all can affect your bowels.  In order to avoid complications following surgery, here are some recommendations in order to help you during your recovery period. ? ?Colace (docusate) - Pick up an over-the-counter form of Colace or another stool softener and take twice a day as long as you are requiring postoperative pain medications.  Take with a full glass of water daily.  If you experience loose stools or diarrhea, hold the colace until you stool forms back up. If your symptoms do not get better within 1 week or if they get worse, check with your doctor. ?Dulcolax (bisacodyl) - Pick up over-the-counter and take as directed by the product packaging as needed to assist with the movement of your bowels.  Take with a full glass of water.  Use this product as needed if not relieved by Colace only.  ?MiraLax (polyethylene glycol) - Pick up over-the-counter to have on hand. MiraLax is a solution that will increase the amount of water in your bowels to assist with bowel movements.  Take as directed and can mix with a glass of water, juice, soda, coffee, or tea. Take if you go more than two days without a movement. Do not use MiraLax more than once per day. Call your doctor if you are still constipated or irregular after using this medication for 7 days in a row. ? ?If you continue to have problems with postoperative constipation, please contact the office for further assistance and recommendations.  If you experience "the worst abdominal pain ever" or develop nausea or vomiting, please contact the office immediatly for further recommendations for treatment. ? ?ITCHING ?If you experience itching with your medications, try taking only a single pain  pill, or even half a pain pill at a time.  You can also use Benadryl over the counter for itching or also to help with sleep.  ? ?MEDICATIONS ?See your medication summary on the ?After Visit Summary? that the nursing staff will review with you prior to discharge.  You may have some home medications which will be placed on hold until you complete the course of blood thinner medication.  It is important for you to complete the blood thinner medication as prescribed by your surgeon.  Continue your approved medications as instructed at time of discharge. ? ?PRECAUTIONS ?If you experience chest pain or shortness of breath - call 911 immediately for transfer to the hospital emergency department.  ?If you develop a fever greater that 101 F, purulent drainage from wound, increased redness or drainage from wound, foul odor from the wound/dressing, or calf pain - CONTACT YOUR SURGEON.   ?                                                ?  FOLLOW-UP APPOINTMENTS ?Make sure you keep all of your appointments after your operation with your surgeon and caregivers. You should call the office at the above phone number and make an appointment for approximately two weeks after the date of your surgery or on the date instructed by your surgeon outlined in the "After Visit Summary". ? ?RANGE OF MOTION AND STRENGTHENING EXERCISES  ?Rehabilitation of the knee is important following a knee injury or an operation. After just a few days of immobilization, the muscles of the thigh which control the knee become weakened and shrink (atrophy). Knee exercises are designed to build up the tone and strength of the thigh muscles and to improve knee motion. Often times heat used for twenty to thirty minutes before working out will loosen up your tissues and help with improving the range of motion but do not use heat for the first two weeks following surgery. These exercises can be done on a training (exercise) mat, on the floor, on a table or on a bed.  Use what ever works the best and is most comfortable for you Knee exercises include:  ?Leg Lifts - While your knee is still immobilized in a splint or cast, you can do straight leg raises. Lift the leg to 60 degr

## 2022-02-02 NOTE — Evaluation (Signed)
Physical Therapy Evaluation ?Patient Details ?Name: Hannah [name] ?MRN: [id number] ?DOB: September 05, 1946 ?Today's Date: 02/02/2022 ? ?History of Present Illness ? 76 yo female s/p L TKA. PMH: afib, HTN, DM, PVCs, Covid-19, CKD, lupus  ?Clinical Impression ? Pt is s/p TKA resulting in the deficits listed below (see PT Problem List).  ?Pt amb ~ 40' with RW and min assist. HEP initiated. Anticipate steady progress in acute setting ? ? Pt will benefit from skilled PT to increase their independence and safety with mobility to allow discharge to the venue listed below.  ?   ?   ? ?Recommendations for follow up therapy are one component of a multi-disciplinary discharge planning process, led by the attending physician.  Recommendations may be updated based on patient status, additional functional criteria and insurance authorization. ? ?Follow Up Recommendations Follow physician's recommendations for discharge plan and follow up therapies ? ?  ?Assistance Recommended at Discharge Intermittent Supervision/Assistance  ?Patient can return home with the following ? A little help with walking and/or transfers;Help with stairs or ramp for entrance;Assistance with cooking/housework ? ?  ?Equipment Recommendations None recommended by PT  ?Recommendations for Other Services ?    ?  ?Functional Status Assessment Patient has had a recent decline in their functional status and demonstrates the ability to make significant improvements in function in a reasonable and predictable amount of time.  ? ?  ?Precautions / Restrictions Precautions ?Precautions: Knee ?Required Braces or Orthoses: Knee Immobilizer - Left ?Knee Immobilizer - Left: Discontinue once straight leg raise with < 10 degree lag ?Restrictions ?Weight Bearing Restrictions: No ?Other Position/Activity Restrictions: WBAT  ? ?  ? ?Mobility ? Bed Mobility ?Overal bed mobility: Needs Assistance ?Bed Mobility: Supine to Sit ?  ?  ?Supine to sit: Min guard ?  ?  ?General bed mobility  comments: for safety ?  ? ?Transfers ?Overall transfer level: Needs assistance ?Equipment used: Rolling walker (2 wheels) ?Transfers: Sit to/from Stand ?Sit to Stand: Min guard, Min assist ?  ?  ?  ?  ?  ?General transfer comment: cues for hand placement and LLE position ?  ? ?Ambulation/Gait ?Ambulation/Gait assistance: Min guard, Min assist ?Gait Distance (Feet): 40 Feet ?Assistive device: Rolling walker (2 wheels) ?Gait Pattern/deviations: Step-to pattern, Decreased stance time - left ?  ?  ?  ?General Gait Details: cues for sequence and RW position from self ? ?Stairs ?  ?  ?  ?  ?  ? ?Wheelchair Mobility ?  ? ?Modified Rankin (Stroke Patients Only) ?  ? ?  ? ?Balance   ?  ?  ?  ?  ?  ?  ?  ?  ?  ?  ?  ?  ?  ?  ?  ?  ?  ?  ?   ? ? ? ?Pertinent Vitals/Pain Pain Assessment ?Pain Assessment: 0-10 ?Pain Score: 3  ?Pain Location: L knee ?Pain Descriptors / Indicators: Aching, Grimacing, Sore ?Pain Intervention(s): Limited activity within patient's tolerance, Monitored during session, Repositioned  ? ? ?Home Living Family/patient expects to be discharged to:: Private residence ?Living Arrangements: Alone ?Available Help at Discharge: Family (sister) ?Type of Home: Apartment ?Home Access: Level entry ?  ?  ?  ?Home Layout: One level ?Home Equipment: Conservation officer, nature (2 wheels);Cane - single point ?   ?  ?Prior Function Prior Level of Function : Independent/Modified Independent ?  ?  ?  ?  ?  ?  ?Mobility Comments: amb with cane ?  ?  ? ? ?  Hand Dominance  ?   ? ?  ?Extremity/Trunk Assessment  ? Upper Extremity Assessment ?Upper Extremity Assessment: Overall WFL for tasks assessed ?  ? ?Lower Extremity Assessment ?Lower Extremity Assessment: LLE deficits/detail ?LLE Deficits / Details: ankle WFL, knee extension and hip flexion 3/5 ?  ? ?   ?Communication  ? Communication: No difficulties  ?Cognition Arousal/Alertness: Awake/alert ?Behavior During Therapy: [facility] for tasks assessed/performed ?Overall Cognitive Status: Within  Functional Limits for tasks assessed ?  ?  ?  ?  ?  ?  ?  ?  ?  ?  ?  ?  ?  ?  ?  ?  ?  ?  ?  ? ?  ?General Comments   ? ?  ?Exercises Total Joint Exercises ?Ankle Circles/Pumps: AROM, 10 reps, Both  ? ?Assessment/Plan  ?  ?PT Assessment Patient needs continued PT services  ?PT Problem List Decreased strength;Decreased range of motion;Decreased activity tolerance;Pain;Decreased knowledge of use of DME;Decreased mobility ? ?   ?  ?PT Treatment Interventions DME instruction;Therapeutic exercise;Gait training;Functional mobility training;Therapeutic activities;Patient/family education   ? ?PT Goals (Current goals can be found in the Care Plan section)  ?Acute Rehab PT Goals ?Patient Stated Goal: home ?PT Goal Formulation: With patient ?Time For Goal Achievement: 02/09/22 ?Potential to Achieve Goals: Good ? ?  ?Frequency   ?  ? ? ?Co-evaluation   ?  ?  ?  ?  ? ? ?  ?AM-PAC PT "6 Clicks" Mobility  ?Outcome Measure Help needed turning from your back to your side while in a flat bed without using bedrails?: A Little ?Help needed moving from lying on your back to sitting on the side of a flat bed without using bedrails?: A Little ?Help needed moving to and from a bed to a chair (including a wheelchair)?: A Little ?Help needed standing up from a chair using your arms (e.g., wheelchair or bedside chair)?: A Little ?Help needed to walk in hospital room?: A Little ?Help needed climbing 3-5 steps with a railing? : A Lot ?6 Click Score: 17 ? ?  ?End of Session Equipment Utilized During Treatment: Gait belt ?Activity Tolerance: Patient tolerated treatment well ?Patient left: in chair;with call bell/phone within reach;with chair alarm set;with family/visitor present ?  ?PT Visit Diagnosis: Other abnormalities of gait and mobility (R26.89);Difficulty in walking, not elsewhere classified (R26.2) ?  ? ?Time: 4097-3532 ?PT Time Calculation (min) (ACUTE ONLY): 23 min ? ? ?Charges:   PT Evaluation ?$PT Eval Low Complexity: 1 Low ?PT  Treatments ?$Gait Training: 8-22 mins ?  ?   ? ? ?Baxter Flattery, PT ? ?Acute Rehab Dept [facility]) [phone] ?Pager [phone] ? ?02/02/2022 ? ? ?Fishman,Leshae Mcclay ?02/02/2022, 5:23 PM ? ?

## 2022-02-02 NOTE — Interval H&P Note (Signed)
History and Physical Interval Note: ? ?02/02/2022 ?8:40 AM ? ?Hannah [name]  has presented today for surgery, with the diagnosis of left knee osteoarthritis.  The various methods of treatment have been discussed with the patient and family. After consideration of risks, benefits and other options for treatment, the patient has consented to  Procedure(s): ?TOTAL KNEE ARTHROPLASTY (Left) as a surgical intervention.  The patient's history has been reviewed, patient examined, no change in status, stable for surgery.  I have reviewed the patient's chart and labs.  Questions were answered to the patient's satisfaction.   ? ? ?Pilar Plate [name] ? ? ?

## 2022-02-02 NOTE — Anesthesia Procedure Notes (Signed)
Anesthesia Regional Block: Adductor canal block  ? ?Pre-Anesthetic Checklist: , timeout performed,  Correct Patient, Correct Site, Correct Laterality,  Correct Procedure,, site marked,  Risks and benefits discussed,  Surgical consent,  Pre-op evaluation,  At surgeon's request and post-op pain management ? ?Laterality: Left ? ?Prep: chloraprep     ?  ?Needles:  ?Injection technique: Single-shot ? ?Needle Type: Echogenic Stimulator Needle   ? ? ?Needle Length: 9cm  ?Needle Gauge: 21  ? ? ? ?Additional Needles: ? ? ?Procedures:,,,, ultrasound used (permanent image in chart),,    ?Narrative:  ?Start time: 02/02/2022 9:45 AM ?End time: 02/02/2022 9:55 AM ?Injection made incrementally with aspirations every 5 mL. ? ?Performed by: Personally  ?Anesthesiologist: Murvin Natal, MD ? ?Additional Notes: ?Functioning IV was confirmed and monitors were applied. A time-out was performed. Hand hygiene and sterile gloves were used. The thigh was placed in a frog-leg position and prepped in a sterile fashion. A 45m 21ga Arrow echogenic stimulator needle was placed using ultrasound guidance.  Negative aspiration and negative test dose prior to incremental administration of local anesthetic. The patient tolerated the procedure well. ? ? ? ? ? ?

## 2022-02-02 NOTE — Anesthesia Preprocedure Evaluation (Signed)
Anesthesia Evaluation  ?Patient identified by MRN, date of birth, ID band ?Patient awake ? ? ? ?Reviewed: ?Allergy & Precautions, NPO status , Patient's Chart, lab work & pertinent test results ? ?Airway ?Mallampati: II ? ?TM Distance: >3 FB ?Neck ROM: Full ? ? ? Dental ?  ?Pulmonary ?asthma ,  ?  ?Pulmonary exam normal ? ? ? ? ? ? ? Cardiovascular ?hypertension, Pt. on medications and Pt. on home beta blockers ?Normal cardiovascular exam+ dysrhythmias Atrial Fibrillation  ? ? ?  ?Neuro/Psych ? Neuromuscular disease negative psych ROS  ? GI/Hepatic ?negative GI ROS, Neg liver ROS,   ?Endo/Other  ?diabetesHypothyroidism  ? Renal/GU ?Renal disease  ? ?  ?Musculoskeletal ? ?(+) Arthritis , Osteoarthritis and Rheumatoid disorders,  Gout  ? Abdominal ?(+) + obese,   ?Peds ? Hematology ? ?(+) Blood dyscrasia, anemia ,   ?Anesthesia Other Findings ?left knee osteoarthritis ? Reproductive/Obstetrics ? ?  ? ? ? ? ? ? ? ? ? ? ? ? ? ?  ?  ? ? ? ? ? ? ? ? ?Anesthesia Physical ?Anesthesia Plan ? ?ASA: 3 ? ?Anesthesia Plan: Regional and Spinal  ? ?Post-op Pain Management: Regional block*  ? ?Induction: Intravenous ? ?PONV Risk Score and Plan: 2 and Ondansetron, Dexamethasone, Treatment may vary due to age or medical condition and Propofol infusion ? ?Airway Management Planned: Simple Face Mask ? ?Additional Equipment:  ? ?Intra-op Plan:  ? ?Post-operative Plan:  ? ?Informed Consent: I have reviewed the patients History and Physical, chart, labs and discussed the procedure including the risks, benefits and alternatives for the proposed anesthesia with the patient or authorized representative who has indicated his/her understanding and acceptance.  ? ? ? ?Dental advisory given ? ?Plan Discussed with: CRNA ? ?Anesthesia Plan Comments:   ? ? ? ? ? ? ?Anesthesia Quick Evaluation ? ?

## 2022-02-02 NOTE — Anesthesia Procedure Notes (Signed)
Spinal ? ?Patient location during procedure: OR ?Start time: 02/02/2022 10:45 AM ?End time: 02/02/2022 10:53 AM ?Reason for block: surgical anesthesia ?Staffing ?Performed: anesthesiologist  ?Anesthesiologist: Murvin Natal, MD ?Preanesthetic Checklist ?Completed: patient identified, IV checked, risks and benefits discussed, surgical consent, monitors and equipment checked, pre-op evaluation and timeout performed ?Spinal Block ?Patient position: sitting ?Prep: DuraPrep ?Patient monitoring: cardiac monitor, continuous pulse ox and blood pressure ?Approach: midline ?Location: L4-5 ?Injection technique: single-shot ?Needle ?Needle type: Quincke  ?Needle gauge: 22 G ?Needle length: 9 cm ?Assessment ?Sensory level: T10 ?Events: CSF return and second provider ?Additional Notes ?Functioning IV was confirmed and monitors were applied. Sterile prep and drape, including hand hygiene and sterile gloves were used. The patient was positioned and the spine was prepped. The skin was anesthetized with lidocaine.  Previous unsuccessful attempts by CRNA. Free flow of clear CSF was obtained after multiple attempts prior to injecting local anesthetic into the CSF.  The spinal needle aspirated freely following injection.  The needle was carefully withdrawn.  The patient tolerated the procedure well.  ? ? ? ?

## 2022-02-02 NOTE — Plan of Care (Signed)

## 2022-02-02 NOTE — Op Note (Signed)
OPERATIVE REPORT-TOTAL KNEE ARTHROPLASTY ? ? ?Pre-operative diagnosis- Osteoarthritis  Left knee(s) ? ?Post-operative diagnosis- Osteoarthritis Left knee(s) ? ?Procedure-  Left  Total Knee Arthroplasty ? ?Surgeon- Dione Plover. Sanjuan Sawa, MD ? ?Assistant- Jaynie Bream, PA-C  ? ?Anesthesia-   Adductor canal block and spinal ? ?EBL-25 mL ?  ?Drains None ? ?Tourniquet time-  ?Total Tourniquet Time Documented: ?Thigh (Left) - 35 minutes ?Total: Thigh (Left) - 35 minutes ?   ? ?Complications- None ? ?Condition-PACU - hemodynamically stable.  ? ?Brief Clinical Note  Hannah [name] is a 76 y.o. year old female with end stage OA of her left knee with progressively worsening pain and dysfunction. She has constant pain, with activity and at rest and significant functional deficits with difficulties even with ADLs. She has had extensive non-op management including analgesics, injections of cortisone and viscosupplements, and home exercise program, but remains in significant pain with significant dysfunction. Radiographs show bone on bone arthritis lateral and patellofemoral. She presents now for left Total Knee Arthroplasty.    ? ?Procedure in detail--- ? ? The patient is brought into the operating room and positioned supine on the operating table. After successful administration of  Adductor canal block and spinal,   a tourniquet is placed high on the  Left thigh(s) and the lower extremity is prepped and draped in the usual sterile fashion. Time out is performed by the operating team and then the  Left lower extremity is wrapped in Esmarch, knee flexed and the tourniquet inflated to 300 mmHg.  ?     A midline incision is made with a ten blade through the subcutaneous tissue to the level of the extensor mechanism. A fresh blade is used to make a medial parapatellar arthrotomy. Soft tissue over the proximal medial tibia is subperiosteally elevated to the joint line with a knife and into the semimembranosus bursa with a Cobb  elevator. Soft tissue over the proximal lateral tibia is elevated with attention being paid to avoiding the patellar tendon on the tibial tubercle. The patella is everted, knee flexed 90 degrees and the ACL and PCL are removed. Findings are bone on bone lateral and patellofemoral with large global osteophytes   ?     The drill is used to create a starting hole in the distal femur and the canal is thoroughly irrigated with sterile saline to remove the fatty contents. The 5 degree Left  valgus alignment guide is placed into the femoral canal and the distal femoral cutting block is pinned to remove 9 mm off the distal femur. Resection is made with an oscillating saw. ?     The tibia is subluxed forward and the menisci are removed. The extramedullary alignment guide is placed referencing proximally at the medial aspect of the tibial tubercle and distally along the second metatarsal axis and tibial crest. The block is pinned to remove 36m off the more deficient lateral  side. Resection is made with an oscillating saw. Size 4is the most appropriate size for the tibia and the proximal tibia is prepared with the modular drill and keel punch for that size. ?     The femoral sizing guide is placed and size 3 is most appropriate. Rotation is marked off the epicondylar axis and confirmed by creating a rectangular flexion gap at 90 degrees. The size 3 cutting block is pinned in this rotation and the anterior, posterior and chamfer cuts are made with the oscillating saw. The intercondylar block is then placed and that cut is  made. ?     Trial size 4 tibial component, trial size 3 posterior stabilized femur and a 10  mm posterior stabilized rotating platform insert trial is placed. Full extension is achieved with excellent varus/valgus and anterior/posterior balance throughout full range of motion. The patella is everted and thickness measured to be 21  mm. Free hand resection is taken to 12 mm, a 35 template is placed, lug holes  are drilled, trial patella is placed, and it tracks normally. Osteophytes are removed off the posterior femur with the trial in place. All trials are removed and the cut bone surfaces prepared with pulsatile lavage. Cement is mixed and once ready for implantation, the size 4 tibial implant, size  3 posterior stabilized femoral component, and the size 35 patella are cemented in place and the patella is held with the clamp. The trial insert is placed and the knee held in full extension. The Exparel (20 ml mixed with 60 ml saline) is injected into the extensor mechanism, posterior capsule, medial and lateral gutters and subcutaneous tissues.  All extruded cement is removed and once the cement is hard the permanent 10 mm posterior stabilized rotating platform insert is placed into the tibial tray. ?     The wound is copiously irrigated with saline solution and the extensor mechanism closed with # 0 Stratofix suture. The tourniquet is released for a total tourniquet time of 35  minutes. Flexion against gravity is 140 degrees and the patella tracks normally. Subcutaneous tissue is closed with 2.0 vicryl and subcuticular with running 4.0 Monocryl. The incision is cleaned and dried and steri-strips and a bulky sterile dressing are applied. The limb is placed into a knee immobilizer and the patient is awakened and transported to recovery in stable condition. ?     Please note that a surgical assistant was a medical necessity for this procedure in order to perform it in a safe and expeditious manner. Surgical assistant was necessary to retract the ligaments and vital neurovascular structures to prevent injury to them and also necessary for proper positioning of the limb to allow for anatomic placement of the prosthesis. ? ? ?Dione Plover Hannah Wegman, MD ? ? ? ?02/02/2022, 11:58 AM ? ? ?

## 2022-02-02 NOTE — Care Plan (Signed)
Ortho Bundle Case Management Note ? ?Patient Details  ?Name: Hannah [name] ?MRN: [id number] ?Date of Birth: 04-13-1946 ? ?                ?L TKA on 02-02-22 DCP: Home with sister DME: No needs, has a RW PT: [facility] on [place]. Referral faxed for [facility] 02-09-22. ? ? ?DME Arranged:  N/A ?DME Agency:    ? ?HH Arranged:    ?[place] Agency:    ? ?Additional Comments: ?Please contact me with any questions of if this plan should need to change. ? ?Larwance Rote  [phone] ?02/02/2022, 1:12 PM ?  ?

## 2022-02-02 NOTE — Progress Notes (Signed)
AssistedDr. Roanna Banning with left, adductor canal block. Side rails up, monitors on throughout procedure. See vital signs in flow sheet. Tolerated Procedure well. ? ?

## 2022-02-02 NOTE — Anesthesia Postprocedure Evaluation (Signed)
Anesthesia Post Note ? ?Patient: Hannah [name] ? ?Procedure(s) Performed: TOTAL KNEE ARTHROPLASTY (Left: Knee) ? ?  ? ?Patient location during evaluation: PACU ?Anesthesia Type: Regional and Spinal ?Level of consciousness: awake ?Pain management: pain level controlled ?Vital Signs Assessment: post-procedure vital signs reviewed and stable ?Respiratory status: spontaneous breathing, nonlabored ventilation, respiratory function stable and patient connected to nasal cannula oxygen ?Cardiovascular status: stable and blood pressure returned to baseline ?Postop Assessment: no apparent nausea or vomiting ?Anesthetic complications: no ? ? ?No notable events documented. ? ?Last Vitals:  ?Vitals:  ? 02/02/22 1430 02/02/22 1524  ?BP: (!) 144/88 (!) 137/96  ?Pulse: (!) 53 (!) 56  ?Resp: 15 18  ?Temp: (!) 36 ?C 37 ?C  ?SpO2: 98% 100%  ?  ?Last Pain:  ?Vitals:  ? 02/02/22 1524  ?TempSrc: Oral  ?PainSc:   ? ? ?  ?  ?  ?  ?  ?  ? ?Danijah Noh P Arius Harnois ? ? ? ? ?

## 2022-02-02 NOTE — Progress Notes (Signed)
Orthopedic Tech Progress Note ?Patient Details:  ?Hannah [name] ?10/02/1945 ?[id number] ? ?CPM Left Knee ?CPM Left Knee: On ?Left Knee Flexion (Degrees): 40 ?Left Knee Extension (Degrees): 10 ? ?Post Interventions ?Patient Tolerated: Well ?Instructions Provided: Care of device, Adjustment of device ? ?[name] ?02/02/2022, 12:21 PM ? ?

## 2022-02-02 NOTE — Transfer of Care (Signed)
Immediate Anesthesia Transfer of Care Note ? ?Patient: Hannah [name] ? ?Procedure(s) Performed: TOTAL KNEE ARTHROPLASTY (Left: Knee) ? ?Patient Location: PACU ? ?Anesthesia Type:Spinal ? ?Level of Consciousness: sedated, drowsy, patient cooperative and responds to stimulation ? ?Airway & Oxygen Therapy: Patient Spontanous Breathing and Patient connected to face mask oxygen ? ?Post-op Assessment: Report given to RN and Post -op Vital signs reviewed and stable ? ?Post vital signs: Reviewed and stable ? ?Last Vitals:  ?Vitals Value Taken Time  ?BP 101/54 02/02/22 1224  ?Temp    ?Pulse 75 02/02/22 1226  ?Resp 14 02/02/22 1226  ?SpO2 100 % 02/02/22 1226  ?Vitals shown include unvalidated device data. ? ?Last Pain: There were no vitals filed for this visit.   ? ?  ? ?Complications: No notable events documented. ?

## 2022-02-03 DIAGNOSIS — E1122 Type 2 diabetes mellitus with diabetic chronic kidney disease: Secondary | ICD-10-CM | POA: Diagnosis not present

## 2022-02-03 DIAGNOSIS — Z8616 Personal history of COVID-19: Secondary | ICD-10-CM | POA: Diagnosis not present

## 2022-02-03 DIAGNOSIS — M1712 Unilateral primary osteoarthritis, left knee: Secondary | ICD-10-CM | POA: Diagnosis not present

## 2022-02-03 DIAGNOSIS — N183 Chronic kidney disease, stage 3 unspecified: Secondary | ICD-10-CM | POA: Diagnosis not present

## 2022-02-03 DIAGNOSIS — J45909 Unspecified asthma, uncomplicated: Secondary | ICD-10-CM | POA: Diagnosis not present

## 2022-02-03 DIAGNOSIS — I129 Hypertensive chronic kidney disease with stage 1 through stage 4 chronic kidney disease, or unspecified chronic kidney disease: Secondary | ICD-10-CM | POA: Diagnosis not present

## 2022-02-03 DIAGNOSIS — E209 Hypoparathyroidism, unspecified: Secondary | ICD-10-CM | POA: Diagnosis not present

## 2022-02-03 DIAGNOSIS — I4891 Unspecified atrial fibrillation: Secondary | ICD-10-CM | POA: Diagnosis not present

## 2022-02-03 DIAGNOSIS — Z7901 Long term (current) use of anticoagulants: Secondary | ICD-10-CM | POA: Diagnosis not present

## 2022-02-03 LAB — CBC
HCT: 28.4 % — ABNORMAL LOW (ref 36.0–46.0)
Hemoglobin: 9.2 g/dL — ABNORMAL LOW (ref 12.0–15.0)
MCH: 31.9 pg (ref 26.0–34.0)
MCHC: 32.4 g/dL (ref 30.0–36.0)
MCV: 98.6 fL (ref 80.0–100.0)
Platelets: 132 10*3/uL — ABNORMAL LOW (ref 150–400)
RBC: 2.88 MIL/uL — ABNORMAL LOW (ref 3.87–5.11)
RDW: 13.6 % (ref 11.5–15.5)
WBC: 7.9 10*3/uL (ref 4.0–10.5)
nRBC: 0 % (ref 0.0–0.2)

## 2022-02-03 LAB — BASIC METABOLIC PANEL
Anion gap: 7 (ref 5–15)
BUN: 33 mg/dL — ABNORMAL HIGH (ref 8–23)
CO2: 22 mmol/L (ref 22–32)
Calcium: 8.1 mg/dL — ABNORMAL LOW (ref 8.9–10.3)
Chloride: 108 mmol/L (ref 98–111)
Creatinine, Ser: 1.17 mg/dL — ABNORMAL HIGH (ref 0.44–1.00)
GFR, Estimated: 48 mL/min — ABNORMAL LOW (ref >60)
Glucose, Bld: 149 mg/dL — ABNORMAL HIGH (ref 70–99)
Potassium: 4.5 mmol/L (ref 3.5–5.1)
Sodium: 137 mmol/L (ref 135–145)

## 2022-02-03 LAB — GLUCOSE, CAPILLARY: Glucose-Capillary: 173 mg/dL — ABNORMAL HIGH (ref 70–99)

## 2022-02-03 MED ORDER — METHOCARBAMOL 500 MG PO TABS
500.0000 mg | ORAL_TABLET | Freq: Four times a day (QID) | ORAL | 0 refills | Status: DC | PRN
Start: 2022-02-03 — End: 2022-07-06

## 2022-02-03 MED ORDER — FERROUS SULFATE 325 (65 FE) MG PO TABS
325.0000 mg | ORAL_TABLET | Freq: Two times a day (BID) | ORAL | 0 refills | Status: AC
Start: 2022-02-03 — End: 2022-02-17

## 2022-02-03 MED ORDER — OXYCODONE HCL 5 MG PO TABS: 5.0000 mg | ORAL_TABLET | Freq: Four times a day (QID) | ORAL | 0 refills | Status: DC | PRN

## 2022-02-03 MED ORDER — TRAMADOL HCL 50 MG PO TABS: 50.0000 mg | ORAL_TABLET | Freq: Four times a day (QID) | ORAL | 0 refills | Status: DC | PRN

## 2022-02-03 NOTE — Progress Notes (Signed)
Physical Therapy Treatment ?Patient Details ?Name: Hannah [name] ?MRN: [id number] ?DOB: [birth year] ?Today's Date: 02/03/2022 ? ? ?History of Present Illness 76 yo female s/p L TKA. PMH: afib, HTN, DM, PVCs, Covid-19, CKD, lupus ? ?  ?PT Comments  ? ? Pt progressing toward goals. Pt had some dizziness ~ last 20' amb distance. BP 176/72, HR 64 with SpO2=99% on RA. RN made aware. Pt and sister state she has a very long walk to get to her apartment. Will see again this afternoon.   ?Recommendations for follow up therapy are one component of a multi-disciplinary discharge planning process, led by the attending physician.  Recommendations may be updated based on patient status, additional functional criteria and insurance authorization. ? ?Follow Up Recommendations ? Follow physician's recommendations for discharge plan and follow up therapies ?  ?  ?Assistance Recommended at Discharge Intermittent Supervision/Assistance  ?Patient can return home with the following A little help with walking and/or transfers;Help with stairs or ramp for entrance;Assistance with cooking/housework ?  ?Equipment Recommendations ? None recommended by PT  ?  ?Recommendations for Other Services   ? ? ?  ?Precautions / Restrictions Precautions ?Precautions: Knee ?Precaution Comments: IND SLRs ?Required Braces or Orthoses: Knee Immobilizer - Left ?Knee Immobilizer - Left: Discontinue once straight leg raise with < 10 degree lag ?Restrictions ?Weight Bearing Restrictions: No ?Other Position/Activity Restrictions: WBAT  ?  ? ?Mobility ? Bed Mobility ?Overal bed mobility: Needs Assistance ?Bed Mobility: Supine to Sit ?  ?  ?Supine to sit: Supervision ?  ?  ?General bed mobility comments: for safety, instructed in use of gait belt as leg lifter ?  ? ?Transfers ?Overall transfer level: Needs assistance ?Equipment used: Rolling walker (2 wheels) ?Transfers: Sit to/from Stand ?Sit to Stand: Min guard ?  ?  ?  ?  ?  ?General transfer comment: cues for  hand placement and LLE position ?  ? ?Ambulation/Gait ?Ambulation/Gait assistance: Min guard ?Gait Distance (Feet): 140 Feet ?Assistive device: Rolling walker (2 wheels) ?Gait Pattern/deviations: Step-to pattern, Decreased stance time - left ?  ?  ?  ?General Gait Details: cues for sequence and RW position from self; dizzy end of distance ? ? ?Stairs ?  ?  ?  ?  ?  ? ? ?Wheelchair Mobility ?  ? ?Modified Rankin (Stroke Patients Only) ?  ? ? ?  ?Balance   ?  ?  ?  ?  ?  ?  ?  ?  ?  ?  ?  ?  ?  ?  ?  ?  ?  ?  ?  ? ?  ?Cognition Arousal/Alertness: Awake/alert ?Behavior During Therapy: [facility] for tasks assessed/performed ?Overall Cognitive Status: Within Functional Limits for tasks assessed ?  ?  ?  ?  ?  ?  ?  ?  ?  ?  ?  ?  ?  ?  ?  ?  ?  ?  ?  ? ?  ?Exercises Total Joint Exercises ?Ankle Circles/Pumps: AROM, 10 reps, Both ?Quad Sets: AROM, Both, 10 reps ?Heel Slides: AROM, Left, 10 reps, AAROM ?Hip ABduction/ADduction: AROM, Left, 10 reps ?Straight Leg Raises: AROM, Left, 10 reps, AAROM, Strengthening ?Goniometric ROM: grossly 6 to 65 degrees L knee flexion ? ?  ?General Comments   ?  ?  ? ?Pertinent Vitals/Pain Pain Assessment ?Pain Assessment: 0-10 ?Pain Score: 3  ?Pain Location: L knee ?Pain Descriptors / Indicators: Aching, Grimacing, Sore ?Pain Intervention(s): Limited activity within patient's tolerance, Monitored during  session, Premedicated before session, Repositioned, Ice applied  ? ? ?Home Living   ?  ?  ?  ?  ?  ?  ?  ?  ?  ?   ?  ?Prior Function    ?  ?  ?   ? ?PT Goals (current goals can now be found in the care plan section) Acute Rehab PT Goals ?Patient Stated Goal: home ?PT Goal Formulation: With patient ?Time For Goal Achievement: 02/09/22 ?Potential to Achieve Goals: Good ?Progress towards PT goals: Progressing toward goals ? ?  ?Frequency ? ? ? 7X/week ? ? ? ?  ?PT Plan Current plan remains appropriate  ? ? ?Co-evaluation   ?  ?  ?  ?  ? ?  ?AM-PAC PT "6 Clicks" Mobility   ?Outcome Measure ? Help  needed turning from your back to your side while in a flat bed without using bedrails?: A Little ?Help needed moving from lying on your back to sitting on the side of a flat bed without using bedrails?: A Little ?Help needed moving to and from a bed to a chair (including a wheelchair)?: A Little ?Help needed standing up from a chair using your arms (e.g., wheelchair or bedside chair)?: A Little ?Help needed to walk in hospital room?: A Little ?Help needed climbing 3-5 steps with a railing? : A Little ?6 Click Score: 18 ? ?  ?End of Session Equipment Utilized During Treatment: Gait belt ?Activity Tolerance: Patient tolerated treatment well ?Patient left: in chair;with call bell/phone within reach;with chair alarm set;with family/visitor present ?Nurse Communication: Mobility status ?PT Visit Diagnosis: Other abnormalities of gait and mobility (R26.89);Difficulty in walking, not elsewhere classified (R26.2) ?  ? ? ?Time: 9390-3009 ?PT Time Calculation (min) (ACUTE ONLY): 25 min ? ?Charges:  $Gait Training: 8-22 mins ?$Therapeutic Exercise: 8-22 mins          ?          ? ? ? ? ?Kenney,Denvil Canning ?02/03/2022, 12:04 PM ? ?

## 2022-02-03 NOTE — Progress Notes (Signed)
? ?  Subjective: ?1 Day Post-Op Procedure(s) (LRB): ?TOTAL KNEE ARTHROPLASTY (Left) ?Patient reports pain as mild.   ?Patient seen in rounds by Dr. Wynelle Link. ?Patient is well, and has had no acute complaints or problems No issues overnight. Denies chest pain, SOB, or calf pain. Foley catheter removed this AM.  ?We will continue therapy today, ambulated 40' yesterday.  ? ?Objective: ?Vital signs in last 24 hours: ?Temp:  [96.8 ?F (36 ?C)-98.6 ?F (37 ?C)] 98.4 ?F (36.9 ?C) (05/09 0534) ?Pulse Rate:  [42-72] 59 (05/09 0534) ?Resp:  [11-22] 17 (05/09 0534) ?BP: (95-162)/(54-96) 148/67 (05/09 0534) ?SpO2:  [96 %-100 %] 97 % (05/09 0534) ?Weight:  [74.3 kg] 74.3 kg (05/08 1604) ? ?Intake/Output from previous day: ? ?Intake/Output Summary (Last 24 hours) at 02/03/2022 0800 ?Last data filed at 02/03/2022 0700 ?Gross per 24 hour  ?Intake 2516.55 ml  ?Output 1425 ml  ?Net 1091.55 ml  ?  ? ?Intake/Output this shift: ?No intake/output data recorded. ? ?Labs: ?Recent Labs  ?  02/03/22 ?0332  ?HGB 9.2*  ? ?Recent Labs  ?  02/03/22 ?0332  ?WBC 7.9  ?RBC 2.88*  ?HCT 28.4*  ?PLT 132*  ? ?Recent Labs  ?  02/03/22 ?0332  ?NA 137  ?K 4.5  ?CL 108  ?CO2 22  ?BUN 33*  ?CREATININE 1.17*  ?GLUCOSE 149*  ?CALCIUM 8.1*  ? ?Recent Labs  ?  02/02/22 ?1455  ?INR 1.0  ? ? ?Exam: ?General - Patient is Alert and Oriented ?Extremity - Neurologically intact ?Neurovascular intact ?Sensation intact distally ?Dorsiflexion/Plantar flexion intact ?Dressing - dressing C/D/I ?Motor Function - intact, moving foot and toes well on exam.  ? ?Past Medical History:  ?Diagnosis Date  ? Anemia   ? Arthritis   ? Asthma   ? Atrial fibrillation ([place])   ? Chronic kidney disease   ? Diabetes mellitus without complication ([place])   ? Gout 12/17/2014  ? patient reported  ? Hyperlipidemia   ? Hypertension   ? Hypothyroidism   ? Systemic lupus erythematosus ([place])   ? Vitiligo   ? ? ?Assessment/Plan: ?1 Day Post-Op Procedure(s) (LRB): ?TOTAL KNEE ARTHROPLASTY (Left) ?Principal  Problem: ?  OA (osteoarthritis) of knee ?Active Problems: ?  Primary osteoarthritis of left knee ? ?Estimated body mass index is 34.23 kg/m? as calculated from the following: ?  Height as of this encounter: '4\' 10"'$  (1.473 m). ?  Weight as of this encounter: 74.3 kg. ?Advance diet ?Up with therapy ?D/C IV fluids ? ? ?Patient's anticipated LOS is less than 2 midnights, meeting these requirements: ?- Lives within 1 hour of care ?- Has a competent adult at home to recover with post-op recover ?- NO history of ? - Chronic pain requiring opioids ? - Coronary Artery Disease ? - Heart failure ? - Heart attack ? - Stroke ? - DVT/VTE ? - Respiratory Failure/COPD ? - Anemia ? - Advanced Liver disease ? ?DVT Prophylaxis -  Eliquis ?Weight bearing as tolerated. ?Continue therapy. ? ?Creatinine and hemoglobin stable this AM. ? ?Plan is to go Home after hospital stay. ?Plan for discharge later today if progresses with therapy and meeting goals. ?Scheduled for OPPT at [facility]. ?Follow-up in the office in 2 weeks. ? ?The PDMP database was reviewed today prior to any opioid medications being prescribed to this patient. ? ?Theresa Duty, PA-C ?Orthopedic Surgery ?[phone] ?02/03/2022, 8:00 AM ? ?

## 2022-02-03 NOTE — TOC Transition Note (Signed)
Transition of Care (TOC) - CM/SW Discharge Note ? ? ?Patient Details  ?Name: [name] ?MRN: [id number] ?Date of Birth: [birth year] ? ?Transition of Care (TOC) CM/SW Contact:  ?Jamelia Varano, LCSW ?Phone Number: ?02/03/2022, 12:28 PM ? ? ?Clinical Narrative:    ?Met briefly with pt and confirming she has needed DME at home.  Plan for OPPT already arranged @ [facility]. No TOC needs. ? ? ?Final next level of care: OP Rehab ?Barriers to Discharge: No Barriers Identified ? ? ?Patient Goals and CMS Choice ?Patient states their goals for this hospitalization and ongoing recovery are:: return home ?  ?  ? ?Discharge Placement ?  ?           ?  ?  ?  ?  ? ?Discharge Plan and Services ?  ?  ?           ?DME Arranged: N/A ?  ?  ?  ?  ?  ?  ?  ?  ?  ? ?Social Determinants of Health (SDOH) Interventions ?  ? ? ?Readmission Risk Interventions ?   ? View : No data to display.  ?  ?  ?  ? ? ? ? ? ?

## 2022-02-03 NOTE — Plan of Care (Signed)

## 2022-02-03 NOTE — Plan of Care (Signed)
Discharge teaching done.  Written information given. 

## 2022-02-03 NOTE — Progress Notes (Signed)
Physical Therapy Treatment ?Patient Details ?Name: Hannah [name] ?MRN: [id number] ?DOB: 1945-12-12 ?Today's Date: 02/03/2022 ? ? ?History of Present Illness 76 yo female s/p L TKA. PMH: afib, HTN, DM, PVCs, Covid-19, CKD, lupus ? ?  ?PT Comments  ? ? Pt progressing well. Meeting goals. Amb incr distance without dizziness this pm. Pt ready to d/c with family assist as needed.   ?Recommendations for follow up therapy are one component of a multi-disciplinary discharge planning process, led by the attending physician.  Recommendations may be updated based on patient status, additional functional criteria and insurance authorization. ? ?Follow Up Recommendations ? Follow physician's recommendations for discharge plan and follow up therapies ?  ?  ?Assistance Recommended at Discharge Intermittent Supervision/Assistance  ?Patient can return home with the following A little help with walking and/or transfers;Help with stairs or ramp for entrance;Assistance with cooking/housework ?  ?Equipment Recommendations ? None recommended by PT  ?  ?Recommendations for Other Services   ? ? ?  ?Precautions / Restrictions Precautions ?Precautions: Knee ?Precaution Comments: IND SLRs ?Required Braces or Orthoses: Knee Immobilizer - Left ?Knee Immobilizer - Left: Discontinue once straight leg raise with < 10 degree lag ?Restrictions ?Weight Bearing Restrictions: No ?Other Position/Activity Restrictions: WBAT  ?  ? ?Mobility ? Bed Mobility ?Overal bed mobility: Needs Assistance ?Bed Mobility: Supine to Sit ?  ?  ?Supine to sit: Supervision ?  ?  ?General bed mobility comments: in recliner ?  ? ?Transfers ?Overall transfer level: Needs assistance ?Equipment used: Rolling walker (2 wheels) ?Transfers: Sit to/from Stand ?Sit to Stand: Min guard, Supervision ?  ?  ?  ?  ?  ?General transfer comment: cues for hand placement and LLE position ?  ? ?Ambulation/Gait ?Ambulation/Gait assistance: Min guard ?Gait Distance (Feet): 200 Feet ?Assistive  device: Rolling walker (2 wheels) ?Gait Pattern/deviations: Step-to pattern, Decreased stance time - left ?  ?  ?  ?General Gait Details: cues for sequence and RW position from self, no dizziness ? ? ?Stairs ?  ?  ?  ?  ?  ? ? ?Wheelchair Mobility ?  ? ?Modified Rankin (Stroke Patients Only) ?  ? ? ?  ?Balance   ?  ?  ?  ?  ?  ?  ?  ?  ?  ?  ?  ?  ?  ?  ?  ?  ?  ?  ?  ? ?  ?Cognition Arousal/Alertness: Awake/alert ?Behavior During Therapy: [facility] for tasks assessed/performed ?Overall Cognitive Status: Within Functional Limits for tasks assessed ?  ?  ?  ?  ?  ?  ?  ?  ?  ?  ?  ?  ?  ?  ?  ?  ?  ?  ?  ? ?  ?Exercises Total Joint Exercises ?Ankle Circles/Pumps: AROM, 10 reps, Both ?Quad Sets: AROM, Both, 10 reps ?Heel Slides: AROM, Left, 10 reps, AAROM ?Hip ABduction/ADduction: AROM, Left, 10 reps ?Straight Leg Raises: AROM, Left, 10 reps, AAROM, Strengthening ?Goniometric ROM: grossly 6 to 65 degrees L knee flexion ? ?  ?General Comments   ?  ?  ? ?Pertinent Vitals/Pain Pain Assessment ?Pain Assessment: 0-10 ?Pain Score: 4  ?Pain Location: L knee ?Pain Descriptors / Indicators: Aching, Grimacing, Sore ?Pain Intervention(s): Limited activity within patient's tolerance, Monitored during session, Premedicated before session, Repositioned  ? ? ?Home Living   ?  ?  ?  ?  ?  ?  ?  ?  ?  ?   ?  ?  Prior Function    ?  ?  ?   ? ?PT Goals (current goals can now be found in the care plan section) Acute Rehab PT Goals ?Patient Stated Goal: home ?PT Goal Formulation: With patient ?Time For Goal Achievement: 02/09/22 ?Potential to Achieve Goals: Good ?Progress towards PT goals: Progressing toward goals ? ?  ?Frequency ? ? ? 7X/week ? ? ? ?  ?PT Plan Current plan remains appropriate  ? ? ?Co-evaluation   ?  ?  ?  ?  ? ?  ?AM-PAC PT "6 Clicks" Mobility   ?Outcome Measure ? Help needed turning from your back to your side while in a flat bed without using bedrails?: A Little ?Help needed moving from lying on your back to sitting on the  side of a flat bed without using bedrails?: A Little ?Help needed moving to and from a bed to a chair (including a wheelchair)?: A Little ?Help needed standing up from a chair using your arms (e.g., wheelchair or bedside chair)?: A Little ?Help needed to walk in hospital room?: A Little ?Help needed climbing 3-5 steps with a railing? : A Little ?6 Click Score: 18 ? ?  ?End of Session Equipment Utilized During Treatment: Gait belt ?Activity Tolerance: Patient tolerated treatment well ?Patient left: in chair;with call bell/phone within reach;with chair alarm set ?Nurse Communication: Mobility status ?PT Visit Diagnosis: Other abnormalities of gait and mobility (R26.89);Difficulty in walking, not elsewhere classified (R26.2) ?  ? ? ?Time: 7092-9574 ?PT Time Calculation (min) (ACUTE ONLY): 22 min ? ?Charges:  $Gait Training: 8-22 mins ?$Therapeutic Exercise: 8-22 mins          ?          ? ? ? ? ?[name],[name] ?02/03/2022, 2:48 PM ? ?

## 2022-02-04 ENCOUNTER — Encounter (HOSPITAL_COMMUNITY): Payer: Self-pay | Admitting: Orthopedic Surgery

## 2022-02-05 ENCOUNTER — Telehealth: Payer: Self-pay

## 2022-02-05 NOTE — Discharge Summary (Signed)
Patient ID: ?[name] ?MRN: [id number] ?DOB/AGE: [birth year] 76 y.o. ? ?Admit date: 02/02/2022 ?Discharge date: 02/03/2022 ? ?Admission Diagnoses:  ?Principal Problem: ?  OA (osteoarthritis) of knee ?Active Problems: ?  Primary osteoarthritis of left knee ? ? ?Discharge Diagnoses:  ?Same ? ?Past Medical History:  ?Diagnosis Date  ? Anemia   ? Arthritis   ? Asthma   ? Atrial fibrillation ([place])   ? Chronic kidney disease   ? Diabetes mellitus without complication ([place])   ? Gout 12/17/2014  ? patient reported  ? Hyperlipidemia   ? Hypertension   ? Hypothyroidism   ? Systemic lupus erythematosus ([place])   ? Vitiligo   ? ? ?Surgeries: Procedure(s): ?TOTAL KNEE ARTHROPLASTY on 02/02/2022 ?  ?Consultants:  ? ?Discharged Condition: Improved ? ?Hospital Course: ANALISE GLOTFELTY is an 76 y.o. female who was admitted 02/02/2022 for operative treatment ofOA (osteoarthritis) of knee. Patient has severe unremitting pain that affects sleep, daily activities, and work/hobbies. After pre-op clearance the patient was taken to the operating room on 02/02/2022 and underwent  Procedure(s): ?TOTAL KNEE ARTHROPLASTY.   ? ?Patient was given perioperative antibiotics:  ?Anti-infectives (From admission, onward)  ? ? Start     Dose/Rate Route Frequency Ordered Stop  ? 02/02/22 1700  ceFAZolin (ANCEF) IVPB 2g/100 mL premix       ? 2 g ?200 mL/hr over 30 Minutes Intravenous Every 6 hours 02/02/22 1447 02/03/22 1231  ? 02/02/22 0745  ceFAZolin (ANCEF) IVPB 2g/100 mL premix       ? 2 g ?200 mL/hr over 30 Minutes Intravenous On call to O.R. 02/02/22 0740 02/02/22 1104  ? ?  ?  ? ?Patient was given sequential compression devices, early ambulation, and chemoprophylaxis to prevent DVT. ? ?Patient benefited maximally from hospital stay and there were no complications.   ? ?Recent vital signs: No data found.  ? ?Recent laboratory studies:  ?Recent Labs  ?  02/02/22 ?1455 02/03/22 ?0332  ?WBC  --  7.9  ?HGB  --  9.2*  ?HCT  --  28.4*  ?PLT  --  132*  ?NA   --  137  ?K  --  4.5  ?CL  --  108  ?CO2  --  22  ?BUN  --  33*  ?CREATININE  --  1.17*  ?GLUCOSE  --  149*  ?INR 1.0  --   ?CALCIUM  --  8.1*  ? ? ? ?Discharge Medications:   ?Allergies as of 02/03/2022   ? ?   Reactions  ? Shellfish Allergy Anaphylaxis, Hives  ? ?  ? ?  ?Medication List  ?  ? ?STOP taking these medications   ? ?BEN GAY EX ?  ? ?  ? ?TAKE these medications   ? ?Accu-Chek Guide test strip ?Generic drug: glucose blood ?Use to check blood sugars daily E11.69 ?  ?Accu-Chek Softclix Lancets lancets ?Use to check blood sugars daily E11.69 ?  ?albuterol 108 (90 Base) MCG/ACT inhaler ?Commonly known as: VENTOLIN HFA ?Inhale 2 puffs into the lungs every 6 (six) hours as needed for wheezing or shortness of breath. ?  ?Alcohol Pads 70 % Pads ?Use as directed to check blood sugars 1 time per day dx: e11.22 ?  ?allopurinol 100 MG tablet ?Commonly known as: ZYLOPRIM ?TAKE 1 TABLET BY MOUTH EVERY DAY FOR GOUT ?  ?amLODipine 2.5 MG tablet ?Commonly known as: NORVASC ?TAKE 1 TABLET(2.5 MG) BY MOUTH DAILY ?  ?apixaban 5 MG Tabs tablet ?Commonly known as:  ELIQUIS ?Take 1 tablet (5 mg total) by mouth 2 (two) times daily. ?  ?ARTIFICIAL TEARS OP ?Apply 1 drop to eye daily as needed (dry eyes). ?  ?atorvastatin 80 MG tablet ?Commonly known as: LIPITOR ?TAKE 1 TABLET BY MOUTH EVERY DAY ?  ?budesonide-formoterol 160-4.5 MCG/ACT inhaler ?Commonly known as: SYMBICORT ?INHALE 2 PUFFS BY MOUTH TWICE DAILY IN THE MORNING AND IN THE EVENING ?What changed:  ?how much to take ?how to take this ?when to take this ?reasons to take this ?additional instructions ?  ?Calcium Carb-Cholecalciferol 600-800 MG-UNIT Tabs ?Take 1 tablet by mouth daily. ?  ?cyanocobalamin 1000 MCG tablet ?Take 1,000 mcg by mouth daily. ?  ?ferrous sulfate 325 (65 FE) MG tablet ?Take 1 tablet (325 mg total) by mouth 2 (two) times daily with a meal for 14 days. ?  ?hydroxychloroquine 200 MG tablet ?Commonly known as: PLAQUENIL ?TAKE 1 TABLET(200 MG) BY MOUTH  DAILY ?  ?irbesartan 300 MG tablet ?Commonly known as: AVAPRO ?TAKE 1 TABLET(300 MG) BY MOUTH DAILY ?  ?iron polysaccharides 150 MG capsule ?Commonly known as: NIFEREX ?Take 150 mg by mouth daily. ?  ?methocarbamol 500 MG tablet ?Commonly known as: ROBAXIN ?Take 1 tablet (500 mg total) by mouth every 6 (six) hours as needed for muscle spasms. ?  ?metoprolol succinate 25 MG 24 hr tablet ?Commonly known as: TOPROL-XL ?Take 1 tablet (25 mg total) by mouth daily. ?  ?oxyCODONE 5 MG immediate release tablet ?Commonly known as: Oxy IR/ROXICODONE ?Take 1-2 tablets (5-10 mg total) by mouth every 6 (six) hours as needed for severe pain. ?  ?Prolia 60 MG/ML Sosy injection ?Generic drug: denosumab ?Inject 60 mg into the skin every 6 (six) months. Courier to rheum: [address] [place], [place] [address]. Appt on 12/10/21 ?  ?Synthroid 88 MCG tablet ?Generic drug: levothyroxine ?TAKE 1 TABLET BY MOUTH EVERY DAY MONDAY TO SATURDAY AND OFF ON SUNDAYS ?  ?traMADol 50 MG tablet ?Commonly known as: ULTRAM ?Take 1-2 tablets (50-100 mg total) by mouth every 6 (six) hours as needed for moderate pain. ?  ?valsartan-hydrochlorothiazide 160-25 MG tablet ?Commonly known as: DIOVAN-HCT ?Take 1 tablet by mouth daily. ?  ?vitamin C 1000 MG tablet ?Take 1,000 mg by mouth daily. ?  ?VITAMIN D PO ?Take 2,000 Units by mouth daily. ?  ? ?  ? ?  ?  ? ? ?  ?Discharge Care Instructions  ?(From admission, onward)  ?  ? ? ?  ? ?  Start     Ordered  ? 02/03/22 0000  Weight bearing as tolerated       ? 02/03/22 0804  ? 02/03/22 0000  Change dressing       ?Comments: You may remove the bulky bandage (ACE wrap and gauze) two days after surgery. You will have an adhesive waterproof bandage underneath. Leave this in place until your first follow-up appointment.  ? 02/03/22 0804  ? ?  ?  ? ?  ? ? ?Diagnostic Studies: No results found. ? ?Disposition: Discharge disposition: 01-Home or Self Care ? ? ? ? ? ? ?Discharge Instructions   ? ? Call MD / Call  911   Complete by: As directed ?  ? If you experience chest pain or shortness of breath, CALL 911 and be transported to the hospital emergency room.  If you develope a fever above 101 F, pus (white drainage) or increased drainage or redness at the wound, or calf pain, call your surgeon's office.  ?  Change dressing   Complete by: As directed ?  ? You may remove the bulky bandage (ACE wrap and gauze) two days after surgery. You will have an adhesive waterproof bandage underneath. Leave this in place until your first follow-up appointment.  ? Constipation Prevention   Complete by: As directed ?  ? Drink plenty of fluids.  Prune juice may be helpful.  You may use a stool softener, such as Colace (over the counter) 100 mg twice a day.  Use MiraLax (over the counter) for constipation as needed.  ? Diet - low sodium heart healthy   Complete by: As directed ?  ? Do not put a pillow under the knee. Place it under the heel.   Complete by: As directed ?  ? Driving restrictions   Complete by: As directed ?  ? No driving for two weeks  ? Post-operative opioid taper instructions:   Complete by: As directed ?  ? POST-OPERATIVE OPIOID TAPER INSTRUCTIONS: ?It is important to wean off of your opioid medication as soon as possible. If you do not need pain medication after your surgery it is ok to stop day one. ?Opioids include: ?Codeine, Hydrocodone(Norco, Vicodin), Oxycodone(Percocet, oxycontin) and hydromorphone amongst others.  ?Long term and even short term use of opiods can cause: ?Increased pain response ?Dependence ?Constipation ?Depression ?Respiratory depression ?And more.  ?Withdrawal symptoms can include ?Flu like symptoms ?Nausea, vomiting ?And more ?Techniques to manage these symptoms ?Hydrate well ?Eat regular healthy meals ?Stay active ?Use relaxation techniques(deep breathing, meditating, yoga) ?Do Not substitute Alcohol to help with tapering ?If you have been on opioids for less than two weeks and do not have pain  than it is ok to stop all together.  ?Plan to wean off of opioids ?This plan should start within one week post op of your joint replacement. ?Maintain the same interval or time between taking each dose and

## 2022-02-05 NOTE — Telephone Encounter (Signed)
Transition Care Management Follow-up Telephone Call ?Date of discharge and from where: 02/03/2022 [facility]  ?How have you been since you were released from the hospital? She states her knee being stiff, and she has chills, but she is doing better.  ?Any questions or concerns? No ? ?Items Reviewed: ?Did the pt receive and understand the discharge instructions provided? Yes  ?Medications obtained and verified? Yes  ?Other? Yes  ?Any new allergies since your discharge? No  ?Dietary orders reviewed? Yes ?Do you have support at home? Yes  ? ?Home Care and Equipment/Supplies: ?Were home health services ordered? no ?If so, what is the name of the agency? N/a  ?Has the agency set up a time to come to the patient's home? no ?Were any new equipment or medical supplies ordered?  No ?What is the name of the medical supply agency? N/a ?Were you able to get the supplies/equipment? no ?Do you have any questions related to the use of the equipment or supplies? No ? ?Functional Questionnaire: (I = Independent and D = Dependent) ?ADLs: I/d ? ?Bathing/Dressing- I/d ? ?Meal Prep- I/d ? ?Eating- I/d ? ?Maintaining continence- I/d ? ?Transferring/Ambulation- I/d ? ?Managing Meds- I/d ? ?Follow up appointments reviewed: ? ?PCP Hospital f/u appt confirmed? No  Scheduled to see n/a on n/a @ n/a. ?[facility] f/u appt confirmed? Yes  Scheduled to see orthopedic on may 23rd  @ 2:30pm. ?Are transportation arrangements needed? No  ?If their condition worsens, is the pt aware to call PCP or go to the Emergency Dept.? Yes ?Was the patient provided with contact information for the PCP's office or ED? Yes ?Was to pt encouraged to call back with questions or concerns? Yes  ?

## 2022-02-09 ENCOUNTER — Telehealth: Payer: Medicare HMO

## 2022-02-09 ENCOUNTER — Ambulatory Visit (INDEPENDENT_AMBULATORY_CARE_PROVIDER_SITE_OTHER): Payer: Medicare HMO

## 2022-02-09 DIAGNOSIS — M0579 Rheumatoid arthritis with rheumatoid factor of multiple sites without organ or systems involvement: Secondary | ICD-10-CM

## 2022-02-09 DIAGNOSIS — E1122 Type 2 diabetes mellitus with diabetic chronic kidney disease: Secondary | ICD-10-CM

## 2022-02-09 DIAGNOSIS — N183 Chronic kidney disease, stage 3 unspecified: Secondary | ICD-10-CM

## 2022-02-09 DIAGNOSIS — I129 Hypertensive chronic kidney disease with stage 1 through stage 4 chronic kidney disease, or unspecified chronic kidney disease: Secondary | ICD-10-CM

## 2022-02-09 DIAGNOSIS — I480 Paroxysmal atrial fibrillation: Secondary | ICD-10-CM

## 2022-02-09 DIAGNOSIS — M25562 Pain in left knee: Secondary | ICD-10-CM | POA: Diagnosis not present

## 2022-02-09 DIAGNOSIS — J4520 Mild intermittent asthma, uncomplicated: Secondary | ICD-10-CM

## 2022-02-09 DIAGNOSIS — M1711 Unilateral primary osteoarthritis, right knee: Secondary | ICD-10-CM

## 2022-02-09 DIAGNOSIS — M6281 Muscle weakness (generalized): Secondary | ICD-10-CM | POA: Diagnosis not present

## 2022-02-09 DIAGNOSIS — D508 Other iron deficiency anemias: Secondary | ICD-10-CM

## 2022-02-09 DIAGNOSIS — R2689 Other abnormalities of gait and mobility: Secondary | ICD-10-CM | POA: Diagnosis not present

## 2022-02-09 NOTE — Progress Notes (Signed)
This encounter was created in error - please disregard.

## 2022-02-09 NOTE — Patient Instructions (Signed)
Visit Information ? ?Thank you for taking time to visit with me today. Please don't hesitate to contact me if I can be of assistance to you before our next scheduled telephone appointment. ? ?Following are the goals we discussed today:  ?(Copy and paste patient goals from clinical care plan here) ? ?Our next appointment is by telephone on 04/24/22 at 1:30 PM  ? ?Please call the care guide team at [phone] if you need to cancel or reschedule your appointment.  ? ?If you are experiencing a Mental Health or [facility] or need someone to talk to, please call [phone]-TALK (toll free, 24 hour hotline)  ? ?Patient verbalizes understanding of instructions and care plan provided today and agrees to view in [facility]. Active MyChart status confirmed with patient.   ? ?Barb Merino, RN, BSN, CCM ?Care Management Coordinator ?[facility]  ?Direct Phone: [phone] ? ? ?

## 2022-02-09 NOTE — Chronic Care Management (AMB) (Signed)
?Chronic Care Management  ? ?CCM RN Visit Note ? ?02/09/2022 ?Name: Hannah [name] MRN: [id number] DOB: 10/31/1945 ? ?Subjective: ?Hannah [name] is a 76 y.o. year old female who is a primary care patient of Glendale Chard, MD. The care management team was consulted for assistance with disease management and care coordination needs.   ? ?Engaged with patient by telephone for follow up visit in response to provider referral for case management and/or care coordination services.  ? ?Consent to Services:  ?The patient was given information about Chronic Care Management services, agreed to services, and gave verbal consent prior to initiation of services.  Please see initial visit note for detailed documentation.  ? ?Patient agreed to services and verbal consent obtained.  ? ?Assessment: Review of patient past medical history, allergies, medications, health status, including review of consultants reports, laboratory and other test data, was performed as part of comprehensive evaluation and provision of chronic care management services.  ? ?SDOH (Social Determinants of Health) assessments and interventions performed:  Yes, no acute needs  ? ?CCM Care Plan ? ?Allergies  ?Allergen Reactions  ? Shellfish Allergy Anaphylaxis and Hives  ? ? ?Outpatient Encounter Medications as of 02/09/2022  ?Medication Sig Note  ? Accu-Chek Softclix Lancets lancets Use to check blood sugars daily E11.69   ? albuterol (PROVENTIL HFA;VENTOLIN HFA) 108 (90 Base) MCG/ACT inhaler Inhale 2 puffs into the lungs every 6 (six) hours as needed for wheezing or shortness of breath.   ? Alcohol Swabs (ALCOHOL PADS) 70 % PADS Use as directed to check blood sugars 1 time per day dx: e11.22   ? allopurinol (ZYLOPRIM) 100 MG tablet TAKE 1 TABLET BY MOUTH EVERY DAY FOR GOUT   ? amLODipine (NORVASC) 2.5 MG tablet TAKE 1 TABLET(2.5 MG) BY MOUTH DAILY   ? apixaban (ELIQUIS) 5 MG TABS tablet Take 1 tablet (5 mg total) by mouth 2 (two) times daily.   ?  Ascorbic Acid (VITAMIN C) 1000 MG tablet Take 1,000 mg by mouth daily.   ? atorvastatin (LIPITOR) 80 MG tablet TAKE 1 TABLET BY MOUTH EVERY DAY   ? budesonide-formoterol (SYMBICORT) 160-4.5 MCG/ACT inhaler INHALE 2 PUFFS BY MOUTH TWICE DAILY IN THE MORNING AND IN THE EVENING (Patient taking differently: Inhale 2 puffs into the lungs 2 (two) times daily as needed (asthma).)   ? Calcium Carb-Cholecalciferol 600-800 MG-UNIT TABS Take 1 tablet by mouth daily.    ? Cholecalciferol (VITAMIN D PO) Take 2,000 Units by mouth daily.   ? cyanocobalamin 1000 MCG tablet Take 1,000 mcg by mouth daily. 01/14/2022: B12  ? denosumab (PROLIA) 60 MG/ML SOSY injection Inject 60 mg into the skin every 6 (six) months. Courier to rheum: [address] [place], [place] [address]. Appt on 12/10/21   ? ferrous sulfate 325 (65 FE) MG tablet Take 1 tablet (325 mg total) by mouth 2 (two) times daily with a meal for 14 days.   ? glucose blood (ACCU-CHEK GUIDE) test strip Use to check blood sugars daily E11.69   ? hydroxychloroquine (PLAQUENIL) 200 MG tablet TAKE 1 TABLET(200 MG) BY MOUTH DAILY   ? Hypromellose (ARTIFICIAL TEARS OP) Apply 1 drop to eye daily as needed (dry eyes).   ? irbesartan (AVAPRO) 300 MG tablet TAKE 1 TABLET(300 MG) BY MOUTH DAILY   ? iron polysaccharides (NIFEREX) 150 MG capsule Take 150 mg by mouth daily.   ? methocarbamol (ROBAXIN) 500 MG tablet Take 1 tablet (500 mg total) by mouth every 6 (six) hours  as needed for muscle spasms.   ? metoprolol succinate (TOPROL-XL) 25 MG 24 hr tablet Take 1 tablet (25 mg total) by mouth daily.   ? oxyCODONE (OXY IR/ROXICODONE) 5 MG immediate release tablet Take 1-2 tablets (5-10 mg total) by mouth every 6 (six) hours as needed for severe pain.   ? SYNTHROID 88 MCG tablet TAKE 1 TABLET BY MOUTH EVERY DAY MONDAY TO SATURDAY AND OFF ON SUNDAYS   ? traMADol (ULTRAM) 50 MG tablet Take 1-2 tablets (50-100 mg total) by mouth every 6 (six) hours as needed for moderate pain.   ?  valsartan-hydrochlorothiazide (DIOVAN-HCT) 160-25 MG tablet Take 1 tablet by mouth daily.   ? ?No facility-administered encounter medications on file as of 02/09/2022.  ? ? ?Patient Active Problem List  ? Diagnosis Date Noted  ? OA (osteoarthritis) of knee 02/02/2022  ? Primary osteoarthritis of left knee 02/02/2022  ? Chronic bilateral low back pain without sciatica 06/25/2021  ? Paresthesia and pain of extremity 06/25/2021  ? Atrial fibrillation ([place]) 04/16/2021  ? Demand ischemia ([place]) 03/26/2021  ? Unspecified atrial fibrillation ([place]) 03/26/2021  ? COVID-19 virus infection 03/26/2021  ? PVC (premature ventricular contraction) 05/06/2020  ? Type 2 diabetes mellitus with stage 3 chronic kidney disease, without long-term current use of insulin ([place]) 03/20/2020  ? Palpitations 03/20/2020  ? 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/2020  ? Primary hypothyroidism 03/20/2020  ? Vitiligo 03/20/2020  ? Iron deficiency anemia 08/12/2017  ? Osteoporosis 10/02/2016  ? Systemic lupus erythematosus ([place]) 10/02/2016  ? Rheumatoid arthritis involving multiple sites with positive rheumatoid factor ([place]) 10/02/2016  ? High risk medication use 10/02/2016  ? History of chronic kidney disease 10/02/2016  ? 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/2016  ? Primary osteoarthritis of both knees 10/02/2016  ? Vitamin D deficiency 10/02/2016  ? History of diabetes mellitus 10/02/2016  ? History of hypertension 10/02/2016  ? History of asthma 10/02/2016  ? Absolute anemia   ? MGUS (monoclonal gammopathy of unknown significance)   ? Normocytic anemia 03/21/2015  ? Essential hypertension 03/20/2015  ? Abnormal CT of the chest 07/30/2012  ? Asthma 06/19/2012  ? ? ?Conditions to be addressed/monitored: CKD III, hypertensive nephropathy, DM, RA, iron deficiency Anemia, Asthma, Primary Osteoarthritis ? ?Care Plan : RN Care Manager Plan of Care  ?Updates made by Lynne Logan, RN since 02/09/2022 12:00 AM  ?  ? ?Problem: No Plan of  Care established for management of chronic disease states (CKD III, hypertensive nephropathy, DM, RA, iron deficiency Anemia, Asthma)   ?Priority: High  ?  ? ?Long-Range Goal: Establishment of plan of care for management of chronic disease states (CKD III, hypertensive nephropathy, DM, RA, iron deficiency Anemia, Asthma)   ?Start Date: 10/22/2021  ?Expected End Date: 10/22/2022  ?Recent Progress: On track  ?Priority: High  ?Note:   ?Current Barriers:  ?Knowledge Deficits related to plan of care for management of CKD III, hypertensive nephropathy, DM, RA, iron deficiency Anemia, Asthma  ?Chronic Disease Management support and education needs related to CKD III, hypertensive nephropathy, DM, RA, iron deficiency Anemia, Asthma  ? ?RNCM Clinical Goal(s):  ?Patient will verbalize basic understanding of  CKD III, hypertensive nephropathy, DM, RA, iron deficiency Anemia, Asthma disease process and self health management plan as evidenced by patient will report having no disease exacerbations related to her chronic disease states as listed above ?take all medications exactly as prescribed and will call provider for medication related questions as evidenced by patient will demonstrate improved  understanding of prescribed medications and rationale for usage as evidenced by patient teach back ?demonstrate Improved health management independence as evidenced by patient will report 100% to her prescribed treatment plan  ?continue to work with RN Care Manager to address care management and care coordination needs related to  CKD III, hypertensive nephropathy, DM, RA, iron deficiency Anemia, Asthma as evidenced by adherence to CM Team Scheduled appointments ?demonstrate ongoing self health care management ability   as evidenced by    through collaboration with RN Care manager, provider, and care team.  ? ?Interventions: ?1:1 collaboration with primary care provider regarding development and update of comprehensive plan of care as  evidenced by provider attestation and co-signature ?Inter-disciplinary care team collaboration (see longitudinal plan of care) ?Evaluation of current treatment plan related to  self management and patient's adherence t

## 2022-02-11 DIAGNOSIS — R2689 Other abnormalities of gait and mobility: Secondary | ICD-10-CM | POA: Diagnosis not present

## 2022-02-11 DIAGNOSIS — M25562 Pain in left knee: Secondary | ICD-10-CM | POA: Diagnosis not present

## 2022-02-11 DIAGNOSIS — M6281 Muscle weakness (generalized): Secondary | ICD-10-CM | POA: Diagnosis not present

## 2022-02-16 DIAGNOSIS — R2689 Other abnormalities of gait and mobility: Secondary | ICD-10-CM | POA: Diagnosis not present

## 2022-02-16 DIAGNOSIS — M25562 Pain in left knee: Secondary | ICD-10-CM | POA: Diagnosis not present

## 2022-02-16 DIAGNOSIS — M6281 Muscle weakness (generalized): Secondary | ICD-10-CM | POA: Diagnosis not present

## 2022-02-18 DIAGNOSIS — M6281 Muscle weakness (generalized): Secondary | ICD-10-CM | POA: Diagnosis not present

## 2022-02-18 DIAGNOSIS — R2689 Other abnormalities of gait and mobility: Secondary | ICD-10-CM | POA: Diagnosis not present

## 2022-02-18 DIAGNOSIS — M25562 Pain in left knee: Secondary | ICD-10-CM | POA: Diagnosis not present

## 2022-02-24 DIAGNOSIS — M6281 Muscle weakness (generalized): Secondary | ICD-10-CM | POA: Diagnosis not present

## 2022-02-24 DIAGNOSIS — M25562 Pain in left knee: Secondary | ICD-10-CM | POA: Diagnosis not present

## 2022-02-24 DIAGNOSIS — R2689 Other abnormalities of gait and mobility: Secondary | ICD-10-CM | POA: Diagnosis not present

## 2022-02-25 DIAGNOSIS — I4891 Unspecified atrial fibrillation: Secondary | ICD-10-CM | POA: Diagnosis not present

## 2022-02-25 DIAGNOSIS — D509 Iron deficiency anemia, unspecified: Secondary | ICD-10-CM

## 2022-02-25 DIAGNOSIS — J45909 Unspecified asthma, uncomplicated: Secondary | ICD-10-CM

## 2022-02-25 DIAGNOSIS — N1831 Chronic kidney disease, stage 3a: Secondary | ICD-10-CM

## 2022-02-25 DIAGNOSIS — E1122 Type 2 diabetes mellitus with diabetic chronic kidney disease: Secondary | ICD-10-CM

## 2022-02-25 DIAGNOSIS — I129 Hypertensive chronic kidney disease with stage 1 through stage 4 chronic kidney disease, or unspecified chronic kidney disease: Secondary | ICD-10-CM

## 2022-03-02 DIAGNOSIS — M6281 Muscle weakness (generalized): Secondary | ICD-10-CM | POA: Diagnosis not present

## 2022-03-02 DIAGNOSIS — M25562 Pain in left knee: Secondary | ICD-10-CM | POA: Diagnosis not present

## 2022-03-02 DIAGNOSIS — R2689 Other abnormalities of gait and mobility: Secondary | ICD-10-CM | POA: Diagnosis not present

## 2022-03-04 DIAGNOSIS — R2689 Other abnormalities of gait and mobility: Secondary | ICD-10-CM | POA: Diagnosis not present

## 2022-03-04 DIAGNOSIS — M25562 Pain in left knee: Secondary | ICD-10-CM | POA: Diagnosis not present

## 2022-03-04 DIAGNOSIS — M6281 Muscle weakness (generalized): Secondary | ICD-10-CM | POA: Diagnosis not present

## 2022-03-05 ENCOUNTER — Telehealth: Payer: Self-pay

## 2022-03-05 NOTE — Chronic Care Management (AMB) (Signed)
    Hannah [name] was reminded to have all medications, supplements and any blood glucose and blood pressure readings available for review with [name], Pharm. D, at her telephone visit on 03-10-2022 at 9:00.   [facility] Pharmacist Assistant [phone]

## 2022-03-09 DIAGNOSIS — R2689 Other abnormalities of gait and mobility: Secondary | ICD-10-CM | POA: Diagnosis not present

## 2022-03-09 DIAGNOSIS — M6281 Muscle weakness (generalized): Secondary | ICD-10-CM | POA: Diagnosis not present

## 2022-03-09 DIAGNOSIS — M25562 Pain in left knee: Secondary | ICD-10-CM | POA: Diagnosis not present

## 2022-03-10 ENCOUNTER — Ambulatory Visit (INDEPENDENT_AMBULATORY_CARE_PROVIDER_SITE_OTHER): Payer: Medicare HMO

## 2022-03-10 ENCOUNTER — Telehealth: Payer: Self-pay

## 2022-03-10 DIAGNOSIS — J4520 Mild intermittent asthma, uncomplicated: Secondary | ICD-10-CM

## 2022-03-10 DIAGNOSIS — I10 Essential (primary) hypertension: Secondary | ICD-10-CM

## 2022-03-10 DIAGNOSIS — Z5189 Encounter for other specified aftercare: Secondary | ICD-10-CM | POA: Diagnosis not present

## 2022-03-10 DIAGNOSIS — E1122 Type 2 diabetes mellitus with diabetic chronic kidney disease: Secondary | ICD-10-CM

## 2022-03-10 NOTE — Progress Notes (Signed)
Chronic Care Management Pharmacy Note  03/10/2022 Name:  Hannah [name] MRN:  [id number] DOB:  10-Nov-1945  Summary: [name] would like to review her medications.   Recommendations/Changes made from today's visit: Recommend patient start checking BP at least three times per week Recommend patient continue to focus on healing and taking medication daily   Plan: [name] is going to purchase a BP cuff and check her BP three times per week.  [name] is focusing on increasing mobility and healing from her knee surgery.   Subjective: Hannah [name] is an 76 y.o. year old female who is a primary patient of Glendale Chard, MD.  The CCM team was consulted for assistance with disease management and care coordination needs.    Engaged with patient by telephone for follow up visit in response to provider referral for pharmacy case management and/or care coordination services.   Consent to Services:  The patient was given information about Chronic Care Management services, agreed to services, and gave verbal consent prior to initiation of services.  Please see initial visit note for detailed documentation.   Patient Care Team: Glendale Chard, MD as PCP - General (Internal Medicine) Nahser, Wonda Cheng, MD as PCP - Cardiology (Cardiology) Lynne Logan, RN as [facility] Management  Recent office visits: 12/31/2021 PCP OV  Recent consult visits: 01/30/2022 Ortho OV 01/16/2022 - Nephrology OV 01/13/2022 - [facility] visits: 02/02/2022   Objective:  Lab Results  Component Value Date   CREATININE 1.17 (H) 02/03/2022   BUN 33 (H) 02/03/2022   EGFR 46 (L) 12/31/2021   GFRNONAA 48 (L) 02/03/2022   GFRAA 44 (L) 12/19/2020   NA 137 02/03/2022   K 4.5 02/03/2022   CALCIUM 8.1 (L) 02/03/2022   CO2 22 02/03/2022   GLUCOSE 149 (H) 02/03/2022    Lab Results  Component Value Date/Time   HGBA1C 5.5 12/31/2021 11:44 AM   HGBA1C 5.8 (H)  10/15/2021 03:21 PM   MICROALBUR 150 12/30/2020 01:02 PM   MICROALBUR 80 10/31/2019 12:13 PM    Last diabetic Eye exam:  Lab Results  Component Value Date/Time   HMDIABEYEEXA No Retinopathy 09/18/2021 12:00 AM    Last diabetic Foot exam: No results found for: "HMDIABFOOTEX"   Lab Results  Component Value Date   CHOL 126 12/31/2021   HDL 57 12/31/2021   LDLCALC 60 12/31/2021   TRIG 30 12/31/2021   CHOLHDL 2.2 12/31/2021       Latest Ref Rng & Units 01/20/2022    8:17 AM 12/31/2021   11:44 AM 11/24/2021   10:29 AM  Hepatic Function  Total Protein 6.5 - 8.1 g/dL 6.5  6.7  6.8   Albumin 3.5 - 5.0 g/dL 3.6  4.3    AST 15 - 41 U/L 20  24  18    ALT 0 - 44 U/L 13  14  11    Alk Phosphatase 38 - 126 U/L 45  59    Total Bilirubin 0.3 - 1.2 mg/dL 0.5  0.5  0.5     Lab Results  Component Value Date/Time   TSH 1.320 12/31/2021 11:44 AM   TSH 1.020 06/25/2021 12:17 PM   FREET4 1.44 12/31/2021 11:44 AM   FREET4 1.43 06/25/2021 12:17 PM       Latest Ref Rng & Units 02/03/2022    3:32 AM 01/20/2022    8:17 AM 12/31/2021   11:44 AM  CBC  WBC 4.0 - 10.5 K/uL 7.9  5.2  6.1   Hemoglobin 12.0 - 15.0 g/dL 9.2  10.1  10.6   Hematocrit 36.0 - 46.0 % 28.4  31.9  31.9   Platelets 150 - 400 K/uL 132  213  145     Lab Results  Component Value Date/Time   VD25OH 42 11/24/2021 10:29 AM   VD25OH 41 05/23/2021 01:33 PM    Clinical ASCVD: Yes  The ASCVD Risk score (Arnett DK, et al., 2019) failed to calculate for the following reasons:   The valid total cholesterol range is 130 to 320 mg/dL       06/25/2021   11:18 AM 06/19/2020   11:27 AM 06/19/2020   10:53 AM  Depression screen PHQ 2/9  Decreased Interest 0 0 0  Down, Depressed, Hopeless 0 0 0  PHQ - 2 Score 0 0 0     Social History   Tobacco Use  Smoking Status Never   Passive exposure: Never  Smokeless Tobacco Never   BP Readings from Last 3 Encounters:  02/03/22 (!) 151/65  01/20/22 (!) 151/73  12/31/21 124/70   Pulse  Readings from Last 3 Encounters:  02/03/22 (!) 56  01/20/22 (!) 57  12/31/21 75   Wt Readings from Last 3 Encounters:  02/02/22 163 lb 12.8 oz (74.3 kg)  01/20/22 164 lb (74.4 kg)  12/31/21 169 lb (76.7 kg)   BMI Readings from Last 3 Encounters:  02/02/22 34.23 kg/m  01/20/22 34.28 kg/m  12/31/21 36.57 kg/m    Assessment/Interventions: Review of patient past medical history, allergies, medications, health status, including review of consultants reports, laboratory and other test data, was performed as part of comprehensive evaluation and provision of chronic care management services.   SDOH:  (Social Determinants of Health) assessments and interventions performed: Yes  SDOH Screenings   Alcohol Screen: Low Risk  (06/25/2021)   Alcohol Screen    Last Alcohol Screening Score (AUDIT): 1  Depression (PHQ2-9): Low Risk  (06/25/2021)   Depression (PHQ2-9)    PHQ-2 Score: 0  Financial Resource Strain: Low Risk  (06/25/2021)   Overall Financial Resource Strain (CARDIA)    Difficulty of Paying Living Expenses: Not hard at all  Food Insecurity: No Food Insecurity (03/10/2022)   Hunger Vital Sign    Worried About Running Out of Food in the Last Year: Never true    Ran Out of Food in the Last Year: Never true  Housing: Low Risk  (06/25/2021)   Housing    Last Housing Risk Score: 0  Physical Activity: Insufficiently Active (03/10/2022)   Exercise Vital Sign    Days of Exercise per Week: 2 days    Minutes of Exercise per Session: 50 min  Social Connections: Moderately Integrated (06/25/2021)   Social Connection and Isolation Panel [NHANES]    Frequency of Communication with Friends and Family: More than three times a week    Frequency of Social Gatherings with Friends and Family: More than three times a week    Attends Religious Services: More than 4 times per year    Active Member of Clubs or Organizations: Yes    Attends Archivist Meetings: 1 to 4 times per year    Marital  Status: Never married  Stress: Stress Concern Present (06/25/2021)   [employer] [place]    Feeling of Stress : To some extent  Tobacco Use: Low Risk  (02/04/2022)   Patient History    Smoking Tobacco Use: Never  Smokeless Tobacco Use: Never    Passive Exposure: Never  Transportation Needs: No Transportation Needs (06/25/2021)   PRAPARE - Transportation    Lack of Transportation (Medical): No    Lack of Transportation (Non-Medical): No    CCM Care Plan  Allergies  Allergen Reactions   Shellfish Allergy Anaphylaxis and Hives    Medications Reviewed Today     Reviewed by Mayford Knife, RPH (Pharmacist) on 03/10/22 at [facility] List Status: <None>   Medication Order Taking? Sig Documenting Provider Last Dose Status Informant  Accu-Chek Softclix Lancets lancets [id number] Yes Use to check blood sugars daily E11.69 Glendale Chard, MD Taking Active Self  albuterol (PROVENTIL HFA;VENTOLIN HFA) 108 (90 Base) MCG/ACT inhaler [id number] Yes Inhale 2 puffs into the lungs every 6 (six) hours as needed for wheezing or shortness of breath. Minette Brine, FNP Taking Active Self  Alcohol Swabs (ALCOHOL PADS) 70 % PADS [id number] Yes Use as directed to check blood sugars 1 time per day dx: e11.22 Glendale Chard, MD Taking Active Self  allopurinol (ZYLOPRIM) 100 MG tablet [id number] Yes TAKE 1 TABLET BY MOUTH EVERY DAY FOR GOUT Glendale Chard, MD Taking Active Self  amLODipine (NORVASC) 2.5 MG tablet [id number] Yes TAKE 1 TABLET(2.5 MG) BY MOUTH DAILY Glendale Chard, MD Taking Active Self  apixaban (ELIQUIS) 5 MG TABS tablet [id number] Yes Take 1 tablet (5 mg total) by mouth 2 (two) times daily. Nahser, Wonda Cheng, MD Taking Active Self  Ascorbic Acid (VITAMIN C) 1000 MG tablet [id number] Yes Take 1,000 mg by mouth daily. [provider] Taking Active Self  atorvastatin (LIPITOR) 80 MG tablet [id number] Yes TAKE 1 TABLET BY MOUTH EVERY DAY  Glendale Chard, MD Taking Active Self  budesonide-formoterol [facility]) 160-4.5 MCG/ACT inhaler [id number] Yes INHALE 2 PUFFS BY MOUTH TWICE DAILY IN THE MORNING AND IN THE EVENING  Patient taking differently: Inhale 2 puffs into the lungs 2 (two) times daily as needed (asthma).   Glendale Chard, MD Taking Active Self  Calcium Carb-Cholecalciferol 600-800 MG-UNIT TABS [id number] Yes Take 1 tablet by mouth daily.  [provider] Taking Active Self  Cholecalciferol (VITAMIN D PO) [id number] Yes Take 2,000 Units by mouth daily. [provider] Taking Active Self  cyanocobalamin 1000 MCG tablet [id number] Yes Take 1,000 mcg by mouth daily. [provider] Taking Active Self           Med Note Wilmon Pali, MELISSA R   Wed Jan 14, 2022 11:59 AM) B12  denosumab (PROLIA) 60 MG/ML SOSY injection [id number] Yes Inject 60 mg into the skin every 6 (six) months. Courier to rheum: [address] [place], [place] [address]. Appt on 12/10/21 Ofilia Neas, PA-C Taking Active Self  docusate sodium (COLACE) 100 MG capsule [id number] Yes Take 100 mg by mouth 2 (two) times daily. [provider]  Active   glucose blood (ACCU-CHEK GUIDE) test strip [id number] Yes Use to check blood sugars daily E11.69 Glendale Chard, MD Taking Active Self  hydroxychloroquine (PLAQUENIL) 200 MG tablet [id number] Yes TAKE 1 TABLET(200 MG) BY MOUTH DAILY Ofilia Neas, PA-C Taking Active Self  Hypromellose (ARTIFICIAL TEARS OP) [id number] Yes Apply 1 drop to eye daily as needed (dry eyes). [provider] Taking Active Self  irbesartan (AVAPRO) 300 MG tablet [id number] Yes TAKE 1 TABLET(300 MG) BY MOUTH DAILY Glendale Chard, MD Taking Active Self  iron polysaccharides (NIFEREX) 150 MG capsule [id number] Yes Take 150 mg by mouth daily. [provider] Taking Active Self  Med Note (LITTLE, ANGEL L   Fri Nov 01, 2020  1:05 PM)    methocarbamol (ROBAXIN) 500 MG tablet [id number] Yes  Take 1 tablet (500 mg total) by mouth every 6 (six) hours as needed for muscle spasms. Edmisten, Ok Anis, PA Taking Active   metoprolol succinate (TOPROL-XL) 25 MG 24 hr tablet [id number] Yes Take 1 tablet (25 mg total) by mouth daily. Nahser, Wonda Cheng, MD Taking Active Self  oxyCODONE (OXY IR/ROXICODONE) 5 MG immediate release tablet [id number] Yes Take 1-2 tablets (5-10 mg total) by mouth every 6 (six) hours as needed for severe pain. Edmisten, Ok Anis, PA Taking Active   polyethylene glycol (MIRALAX / GLYCOLAX) 17 g packet [id number] Yes Take 17 g by mouth daily. [provider] Taking Active   SYNTHROID 88 MCG tablet [id number] Yes TAKE 1 TABLET BY MOUTH EVERY DAY MONDAY TO SATURDAY AND OFF ON Nemiah Commander, MD Taking Active Self  traMADol (ULTRAM) 50 MG tablet [id number] Yes Take 1-2 tablets (50-100 mg total) by mouth every 6 (six) hours as needed for moderate pain. Edmisten, Ok Anis, PA Taking Active   valsartan-hydrochlorothiazide (DIOVAN-HCT) 160-25 MG tablet [id number] Yes Take 1 tablet by mouth daily. [provider] Taking Active Self            Patient Active Problem List   Diagnosis Date Noted   OA (osteoarthritis) of knee 02/02/2022   Primary osteoarthritis of left knee 02/02/2022   Chronic bilateral low back pain without sciatica 06/25/2021   Paresthesia and pain of extremity 06/25/2021   Atrial fibrillation ([place]) 04/16/2021   Demand ischemia ([place]) 03/26/2021   Unspecified atrial fibrillation ([place]) 03/26/2021   COVID-19 virus infection 03/26/2021   PVC (premature ventricular contraction) 05/06/2020   Type 2 diabetes mellitus with stage 3 chronic kidney disease, without long-term current use of insulin ([place]) 03/20/2020   Palpitations 03/20/2020   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/2020   Primary hypothyroidism 03/20/2020   Vitiligo 03/20/2020   Iron deficiency anemia 08/12/2017   Osteoporosis 10/02/2016   Systemic lupus erythematosus ([place])  10/02/2016   Rheumatoid arthritis involving multiple sites with positive rheumatoid factor ([place]) 10/02/2016   High risk medication use 10/02/2016   History of chronic kidney disease 10/02/2016   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/2016   Primary osteoarthritis of both knees 10/02/2016   Vitamin D deficiency 10/02/2016   History of diabetes mellitus 10/02/2016   History of hypertension 10/02/2016   History of asthma 10/02/2016   Absolute anemia    MGUS (monoclonal gammopathy of unknown significance)    Normocytic anemia 03/21/2015   Essential hypertension 03/20/2015   Abnormal CT of the chest 07/30/2012   Asthma 06/19/2012    Immunization History  Administered Date(s) Administered   19-influenza Whole 07/15/2012   DTaP 06/18/2019   Fluad Quad(high Dose 65+) 06/19/2020, 06/03/2021   Influenza Split 06/28/2014   Influenza, High Dose Seasonal PF 05/18/2019   Influenza,inj,Quad PF,6+ Mos 06/20/2018   Moderna SARS-COV2 Booster Vaccination 01/01/2021   Moderna Sars-Covid-2 Vaccination 11/20/2019, 12/19/2019, 08/06/2020, 01/01/2021, 08/13/2021   Pneumococcal Conjugate-13 05/04/2018   Pneumococcal Polysaccharide-23 05/18/2019   Pneumococcal-Unspecified 06/28/2014   Tdap 06/15/2019   Zoster Recombinat (Shingrix) 02/19/2021, 04/24/2021    Conditions to be addressed/monitored:  Hypertension, Hyperlipidemia, and Asthma  Care Plan : [place]  Updates made by Mayford Knife, [place] since 03/10/2022 12:00 AM     Problem: HTN, DM II,  Asthma   Priority: High     Long-Range  Goal: Disease Management   Recent Progress: On track  Priority: High  Note:   Current Barriers:  Unable to independently monitor therapeutic efficacy  Pharmacist Clinical Goal(s):  Patient will achieve adherence to monitoring guidelines and medication adherence to achieve therapeutic efficacy through collaboration with PharmD and provider.   Interventions: 1:1  collaboration with Glendale Chard, MD regarding development and update of comprehensive plan of care as evidenced by provider attestation and co-signature Inter-disciplinary care team collaboration (see longitudinal plan of care) Comprehensive medication review performed; medication list updated in electronic medical record  Hypertension (BP goal <130/80) -Uncontrolled -Current treatment: Irbesartan 300 mg tablet by mouth daily Appropriate, Effective, Safe, Accessible Amlodipine 2.5 mg tablet once per day Appropriate, Effective, Safe, Accessible Valsartan-Hydrochlorothiazide 160-25 mg tablet once per day Appropriate, Effective, Safe, Accessible -Current home readings: she is not currently checking her BP at home.  -Current exercise habits: she is walking [place] the breez[place]ay and she is doing physical therapy twice per week for one hour.  -Denies hypotensive/hypertensive symptoms -Educated on BP goals and benefits of medications for prevention of heart attack, stroke and kidney damage; Importance of home blood pressure monitoring; Proper BP monitoring technique; Symptoms of hypotension and importance of maintaining adequate hydration; -She is going to purchase a new BP cuff at [employer].  -Counseled to monitor BP at home at least three times per week, document, and provide log at future appointments -Recommended to continue current medication   Diabetes (A1c goal <7%) -Controlled -Current medications: not currently taking any medication  -Current home glucose readings fasting glucose: 80's -Denies hypoglycemic/hyperglycemic symptoms -Current meal patterns: she has been eating the insurance meal plan, using the healthy heart and low sodium meals. She reports that they were not tasty, but the were nutritious. -Current exercise: please see hypertension  -Educated on A1c and blood sugar goals; -Congratulated patient on checking BS at least three times per week - I encouraged Ms. Spieker to  continue to drink 7-8 glasses of water per day, she let me know that her specialist told her she should only drink water when thirsty.  -Counseled to check feet daily and get yearly eye exams -Counseled on diet and exercise extensively  Asthma (Goal: control symptoms and prevent exacerbations) -Controlled -Current treatment  Symbicort 160-4.5 MCG/ACT - Inhale 2 puffs by mouth twice daily Appropriate, Effective, Safe, Accessible Albuterol 108 (90 Base): Inhale 2 puffs into the lungs every 6 hours as needed for wheezing or shortness of breath Appropriate, Effective, Safe, Accessible -Pulmonary function testing: PFT's 10/18/2012 FEV1 1.63 (99%) ratio 70 and no better p B2, DLCO 85 corrects to 124  -Exacerbations requiring treatment in last 6 months: none  -Patient reports consistent use of maintenance inhaler -Frequency of rescue inhaler use: denies  -Counseled on Proper inhaler technique; Benefits of consistent maintenance inhaler use -Recommended to continue current medication   Patient Goals/Self-Care Activities Patient will:  - take medications as prescribed as evidenced by patient report and record review  Follow Up Plan: The patient has been provided with contact information for the care management team and has been advised to call with any health related questions or concerns.       Medication Assistance: None required.  Patient affirms current coverage meets needs.  Compliance/Adherence/Medication fill history: Care Gaps: -COVID-19 Vaccine   Star-Rating Drugs: Valsartan 160 mg tablet Atorvastatin 80 mg tablet   Patient's preferred pharmacy is:  [facility], [place] - [address] AT [address]  [place] [address] Phone: [phone] Fax: [phone]  [place] [address] Phone: [phone] Fax: [phone]  [id number] - [address]  [place] [address] Phone: [phone] Fax: [phone]  Uses pill box? Yes Pt endorses 95% compliance  We discussed: Benefits of medication synchronization, packaging and delivery as well as enhanced pharmacist oversight with [employer]. Patient decided to: Continue current medication management strategy  Care Plan and Follow Up Patient Decision:  Patient agrees to Care Plan and Follow-up.  Plan: The patient has been provided with contact information for the care management team and has been advised to call with any health related questions or concerns.   [name], CPP, PharmD Clinical Pharmacist Practitioner [facility] [phone]

## 2022-03-10 NOTE — Chronic Care Management (AMB) (Cosign Needed)
AZ&Me notification:  03/04/2022- Symbicort shipped on 03/02/2022 and should arrive to patient's home in 1-2 business day, Tracking number [phone].  Pattricia Boss, [place] Pharmacist Assistant [phone]

## 2022-03-10 NOTE — Patient Instructions (Addendum)
Visit Information It was great speaking with you today!  Please let me know if you have any questions about our visit.   Goals Addressed             This Visit's Progress    Track and Manage My Blood Pressure-Hypertension       Timeframe:  Long-Range Goal Priority:  Medium Start Date:  01/13/21                          Expected End Date: 12/2022             Follow Up Date: 09/02/2022   In progress:  Self-Care Activities: Self administers medications as prescribed Attends all scheduled provider appointments Calls provider office for new concerns, questions, or BP outside discussed parameters Checks BP and records as discussed Follows a low sodium diet/DASH diet Patient Goals: - check blood pressure 5 times per week - choose a place to take my blood pressure (home, clinic or office, retail store) - write blood pressure results in a log or diary - learn about high blood pressure   Why is this important?   You won't feel high blood pressure, but it can still hurt your blood vessels.  Making lifestyle changes like losing a little weight or eating less salt will help.  Checking your blood pressure at home and at different times of the day can help to control blood pressure.  If the doctor prescribes medicine remember to take it the way the doctor ordered.  Call the office if you cannot afford the medicine or if there are questions about it.            Patient Care Plan: CCM Pharmacy Care Plan     Problem Identified: HTN, DM II,  Asthma   Priority: High     Long-Range Goal: Disease Management   Recent Progress: On track  Priority: High  Note:   Current Barriers:  Unable to independently monitor therapeutic efficacy  Pharmacist Clinical Goal(s):  Patient will achieve adherence to monitoring guidelines and medication adherence to achieve therapeutic efficacy through collaboration with PharmD and provider.   Interventions: 1:1 collaboration with Glendale Chard, MD  regarding development and update of comprehensive plan of care as evidenced by provider attestation and co-signature Inter-disciplinary care team collaboration (see longitudinal plan of care) Comprehensive medication review performed; medication list updated in electronic medical record  Hypertension (BP goal <130/80) -Uncontrolled -Current treatment: Irbesartan 300 mg tablet by mouth daily Appropriate, Effective, Safe, Accessible Amlodipine 2.5 mg tablet once per day Appropriate, Effective, Safe, Accessible Valsartan-Hydrochlorothiazide 160-25 mg tablet once per day  Appropriate, Effective, Safe, Accessible -Current home readings: she is not currently checking her BP at home.  -Current exercise habits: she is walking [place] breez[place]ay and she is doing physical therapy twice per week for one hour.  -Denies hypotensive/hypertensive symptoms -Educated on BP goals and benefits of medications for prevention of heart attack, stroke and kidney damage; Importance of home blood pressure monitoring; Proper BP monitoring technique; Symptoms of hypotension and importance of maintaining adequate hydration; -She is going to purchase a new BP cuff at [employer].  -Counseled to monitor BP at home at least three times per week, document, and provide log at future appointments -Recommended to continue current medication   Diabetes (A1c goal <7%) -Controlled -Current medications: not currently taking any medication  -Current home glucose readings fasting glucose: 80's -Denies hypoglycemic/hyperglycemic symptoms -Current meal patterns: she has been  eating the insurance meal plan, using the healthy heart and low sodium meals. She reports that they were not tasty, but the were nutritious. -Current exercise: please see hypertension  -Educated on A1c and blood sugar goals; -Congratulated patient on checking BS at least three times per week - I encouraged Ms. Lamphear to continue to drink 7-8 glasses of water  per day, she let me know that her specialist told her she should only drink water when thirsty.  -Counseled to check feet daily and get yearly eye exams -Counseled on diet and exercise extensively  Asthma (Goal: control symptoms and prevent exacerbations) -Controlled -Current treatment  Symbicort 160-4.5 MCG/ACT - Inhale 2 puffs by mouth twice daily Appropriate, Effective, Safe, Accessible Albuterol 108 (90 Base): Inhale 2 puffs into the lungs every 6 hours as needed for wheezing or shortness of breath Appropriate, Effective, Safe, Accessible -Pulmonary function testing: PFT's 10/18/2012 FEV1 1.63 (99%) ratio 70 and no better p B2, DLCO 85 corrects to 124  -Exacerbations requiring treatment in last 6 months: none  -Patient reports consistent use of maintenance inhaler -Frequency of rescue inhaler use: denies  -Counseled on Proper inhaler technique; Benefits of consistent maintenance inhaler use -Recommended to continue current medication   Patient Goals/Self-Care Activities Patient will:  - take medications as prescribed as evidenced by patient report and record review  Follow Up Plan: The patient has been provided with contact information for the care management team and has been advised to call with any health related questions or concerns.       Patient agreed to services and verbal consent obtained.   The patient verbalized understanding of instructions, educational materials, and care plan provided today and agreed to receive a mailed copy of patient instructions, educational materials, and care plan.   [name], PharmD Clinical Pharmacist [facility] [phone]

## 2022-03-13 DIAGNOSIS — M6281 Muscle weakness (generalized): Secondary | ICD-10-CM | POA: Diagnosis not present

## 2022-03-13 DIAGNOSIS — M25562 Pain in left knee: Secondary | ICD-10-CM | POA: Diagnosis not present

## 2022-03-13 DIAGNOSIS — R2689 Other abnormalities of gait and mobility: Secondary | ICD-10-CM | POA: Diagnosis not present

## 2022-03-17 DIAGNOSIS — M6281 Muscle weakness (generalized): Secondary | ICD-10-CM | POA: Diagnosis not present

## 2022-03-17 DIAGNOSIS — R2689 Other abnormalities of gait and mobility: Secondary | ICD-10-CM | POA: Diagnosis not present

## 2022-03-17 DIAGNOSIS — M25562 Pain in left knee: Secondary | ICD-10-CM | POA: Diagnosis not present

## 2022-03-18 ENCOUNTER — Telehealth: Payer: Self-pay

## 2022-03-18 NOTE — Chronic Care Management (AMB) (Signed)
Chronic Care Management Pharmacy Assistant   Name: Hannah [name]  MRN: [id number] DOB: [birth year]  Reason for Encounter: Patient assistance   03-18-2022: Patient left voicemail stating next shipment of synthroid from [facility] is scheduled for 07-05 -2023 but needs to come to her home instead of Dr. Baird [facility] office. Contacted [employer] and was instructed to refax page 2 section 3 with shipment indication. Sent Tamala melvin a message to refax. Informed patient.  Medications: Outpatient Encounter Medications as of 03/18/2022  Medication Sig Note   Accu-Chek Softclix Lancets lancets Use to check blood sugars daily E11.69    albuterol (PROVENTIL HFA;VENTOLIN HFA) 108 (90 Base) MCG/ACT inhaler Inhale 2 puffs into the lungs every 6 (six) hours as needed for wheezing or shortness of breath.    Alcohol Swabs (ALCOHOL PADS) 70 % PADS Use as directed to check blood sugars 1 time per day dx: e11.22    allopurinol (ZYLOPRIM) 100 MG tablet TAKE 1 TABLET BY MOUTH EVERY DAY FOR GOUT    amLODipine (NORVASC) 2.5 MG tablet TAKE 1 TABLET(2.5 MG) BY MOUTH DAILY    apixaban (ELIQUIS) 5 MG TABS tablet Take 1 tablet (5 mg total) by mouth 2 (two) times daily.    Ascorbic Acid (VITAMIN C) 1000 MG tablet Take 1,000 mg by mouth daily.    atorvastatin (LIPITOR) 80 MG tablet TAKE 1 TABLET BY MOUTH EVERY DAY    budesonide-formoterol (SYMBICORT) 160-4.5 MCG/ACT inhaler INHALE 2 PUFFS BY MOUTH TWICE DAILY IN THE MORNING AND IN THE EVENING (Patient taking differently: Inhale 2 puffs into the lungs 2 (two) times daily as needed (asthma).)    Calcium Carb-Cholecalciferol 600-800 MG-UNIT TABS Take 1 tablet by mouth daily.     Cholecalciferol (VITAMIN D PO) Take 2,000 Units by mouth daily.    cyanocobalamin 1000 MCG tablet Take 1,000 mcg by mouth daily. 01/14/2022: B12   denosumab (PROLIA) 60 MG/ML SOSY injection Inject 60 mg into the skin every 6 (six) months. Courier to rheum: [address] [place], [place]  [address]. Appt on 12/10/21    docusate sodium (COLACE) 100 MG capsule Take 100 mg by mouth 2 (two) times daily.    glucose blood (ACCU-CHEK GUIDE) test strip Use to check blood sugars daily E11.69    hydroxychloroquine (PLAQUENIL) 200 MG tablet TAKE 1 TABLET(200 MG) BY MOUTH DAILY    Hypromellose (ARTIFICIAL TEARS OP) Apply 1 drop to eye daily as needed (dry eyes).    irbesartan (AVAPRO) 300 MG tablet TAKE 1 TABLET(300 MG) BY MOUTH DAILY    iron polysaccharides (NIFEREX) 150 MG capsule Take 150 mg by mouth daily.    methocarbamol (ROBAXIN) 500 MG tablet Take 1 tablet (500 mg total) by mouth every 6 (six) hours as needed for muscle spasms.    metoprolol succinate (TOPROL-XL) 25 MG 24 hr tablet Take 1 tablet (25 mg total) by mouth daily.    oxyCODONE (OXY IR/ROXICODONE) 5 MG immediate release tablet Take 1-2 tablets (5-10 mg total) by mouth every 6 (six) hours as needed for severe pain.    polyethylene glycol (MIRALAX / GLYCOLAX) 17 g packet Take 17 g by mouth daily.    SYNTHROID 88 MCG tablet TAKE 1 TABLET BY MOUTH EVERY DAY MONDAY TO SATURDAY AND OFF ON SUNDAYS    traMADol (ULTRAM) 50 MG tablet Take 1-2 tablets (50-100 mg total) by mouth every 6 (six) hours as needed for moderate pain.    valsartan-hydrochlorothiazide (DIOVAN-HCT) 160-25 MG tablet Take 1 tablet by mouth daily.  No facility-administered encounter medications on file as of 03/18/2022.     [place] Pharmacist Assistant [phone]

## 2022-03-19 ENCOUNTER — Other Ambulatory Visit: Payer: Self-pay | Admitting: Physician Assistant

## 2022-03-19 DIAGNOSIS — M0579 Rheumatoid arthritis with rheumatoid factor of multiple sites without organ or systems involvement: Secondary | ICD-10-CM

## 2022-03-19 NOTE — Telephone Encounter (Signed)
Next Visit: 05/13/2022  Last Visit: 11/24/2021  Labs: 01/20/2022, RBC 3.21, Hemoglobin 10.1, HCT 31.9, Creatinine 1.09, GFR 53,   Eye exam: 09/18/2022   Current Dose per office note 11/24/2021: Plaquenil 200 mg 1 tablet by mouth daily.   YM:EBRAXENMMH arthritis involving multiple sites with positive rheumatoid factor   Last Fill: 12/23/2021  Okay to refill Plaquenil?

## 2022-03-20 DIAGNOSIS — R2689 Other abnormalities of gait and mobility: Secondary | ICD-10-CM | POA: Diagnosis not present

## 2022-03-20 DIAGNOSIS — M6281 Muscle weakness (generalized): Secondary | ICD-10-CM | POA: Diagnosis not present

## 2022-03-20 DIAGNOSIS — M25562 Pain in left knee: Secondary | ICD-10-CM | POA: Diagnosis not present

## 2022-03-25 DIAGNOSIS — M25562 Pain in left knee: Secondary | ICD-10-CM | POA: Diagnosis not present

## 2022-03-25 DIAGNOSIS — R2689 Other abnormalities of gait and mobility: Secondary | ICD-10-CM | POA: Diagnosis not present

## 2022-03-25 DIAGNOSIS — M6281 Muscle weakness (generalized): Secondary | ICD-10-CM | POA: Diagnosis not present

## 2022-03-26 ENCOUNTER — Telehealth: Payer: Self-pay

## 2022-03-26 NOTE — Chronic Care Management (AMB) (Signed)
Faxed change of shipment request to [facility] assist for patient's Synthroid, medications were being shipped to providers office but patent would like for her next shipments to come to her home.  Pattricia Boss, [place] Pharmacist Assistant [phone]

## 2022-03-27 DIAGNOSIS — E1159 Type 2 diabetes mellitus with other circulatory complications: Secondary | ICD-10-CM

## 2022-03-27 DIAGNOSIS — J45909 Unspecified asthma, uncomplicated: Secondary | ICD-10-CM

## 2022-03-27 DIAGNOSIS — I10 Essential (primary) hypertension: Secondary | ICD-10-CM | POA: Diagnosis not present

## 2022-03-30 DIAGNOSIS — R2689 Other abnormalities of gait and mobility: Secondary | ICD-10-CM | POA: Diagnosis not present

## 2022-03-30 DIAGNOSIS — M25562 Pain in left knee: Secondary | ICD-10-CM | POA: Diagnosis not present

## 2022-03-30 DIAGNOSIS — M6281 Muscle weakness (generalized): Secondary | ICD-10-CM | POA: Diagnosis not present

## 2022-04-02 ENCOUNTER — Other Ambulatory Visit: Payer: Self-pay | Admitting: Internal Medicine

## 2022-04-06 DIAGNOSIS — M25562 Pain in left knee: Secondary | ICD-10-CM | POA: Diagnosis not present

## 2022-04-06 DIAGNOSIS — R2689 Other abnormalities of gait and mobility: Secondary | ICD-10-CM | POA: Diagnosis not present

## 2022-04-06 DIAGNOSIS — M6281 Muscle weakness (generalized): Secondary | ICD-10-CM | POA: Diagnosis not present

## 2022-04-08 ENCOUNTER — Telehealth: Payer: Self-pay

## 2022-04-08 NOTE — Chronic Care Management (AMB) (Signed)
AZ&Me Notification:  Received fax that patient needs a refill of Symbicort to [facility] in order to continue receiving through patient assistance, task sent to clinical team for a refill.  Pattricia Boss, [place] Pharmacist Assistant [phone]

## 2022-04-09 ENCOUNTER — Other Ambulatory Visit: Payer: Self-pay

## 2022-04-09 MED ORDER — BUDESONIDE-FORMOTEROL FUMARATE 160-4.5 MCG/ACT IN AERO: INHALATION_SPRAY | RESPIRATORY_TRACT | 2 refills | Status: DC

## 2022-04-10 DIAGNOSIS — M6281 Muscle weakness (generalized): Secondary | ICD-10-CM | POA: Diagnosis not present

## 2022-04-10 DIAGNOSIS — R2689 Other abnormalities of gait and mobility: Secondary | ICD-10-CM | POA: Diagnosis not present

## 2022-04-10 DIAGNOSIS — M25562 Pain in left knee: Secondary | ICD-10-CM | POA: Diagnosis not present

## 2022-04-13 ENCOUNTER — Other Ambulatory Visit: Payer: Self-pay | Admitting: Internal Medicine

## 2022-04-13 DIAGNOSIS — R2689 Other abnormalities of gait and mobility: Secondary | ICD-10-CM | POA: Diagnosis not present

## 2022-04-13 DIAGNOSIS — M25562 Pain in left knee: Secondary | ICD-10-CM | POA: Diagnosis not present

## 2022-04-13 DIAGNOSIS — M6281 Muscle weakness (generalized): Secondary | ICD-10-CM | POA: Diagnosis not present

## 2022-04-15 DIAGNOSIS — M6281 Muscle weakness (generalized): Secondary | ICD-10-CM | POA: Diagnosis not present

## 2022-04-15 DIAGNOSIS — M25562 Pain in left knee: Secondary | ICD-10-CM | POA: Diagnosis not present

## 2022-04-15 DIAGNOSIS — R2689 Other abnormalities of gait and mobility: Secondary | ICD-10-CM | POA: Diagnosis not present

## 2022-04-17 ENCOUNTER — Other Ambulatory Visit: Payer: Self-pay | Admitting: *Deleted

## 2022-04-17 MED ORDER — METOPROLOL SUCCINATE ER 25 MG PO TB24: 25.0000 mg | ORAL_TABLET | Freq: Every day | ORAL | 1 refills | Status: DC

## 2022-04-20 DIAGNOSIS — R2689 Other abnormalities of gait and mobility: Secondary | ICD-10-CM | POA: Diagnosis not present

## 2022-04-20 DIAGNOSIS — M6281 Muscle weakness (generalized): Secondary | ICD-10-CM | POA: Diagnosis not present

## 2022-04-20 DIAGNOSIS — M25562 Pain in left knee: Secondary | ICD-10-CM | POA: Diagnosis not present

## 2022-04-22 DIAGNOSIS — M25562 Pain in left knee: Secondary | ICD-10-CM | POA: Diagnosis not present

## 2022-04-22 DIAGNOSIS — R2689 Other abnormalities of gait and mobility: Secondary | ICD-10-CM | POA: Diagnosis not present

## 2022-04-22 DIAGNOSIS — M6281 Muscle weakness (generalized): Secondary | ICD-10-CM | POA: Diagnosis not present

## 2022-04-23 ENCOUNTER — Ambulatory Visit: Payer: Medicare HMO | Admitting: Rheumatology

## 2022-04-24 ENCOUNTER — Telehealth: Payer: Self-pay

## 2022-04-24 DIAGNOSIS — I10 Essential (primary) hypertension: Secondary | ICD-10-CM

## 2022-04-24 DIAGNOSIS — I480 Paroxysmal atrial fibrillation: Secondary | ICD-10-CM

## 2022-04-24 DIAGNOSIS — N183 Chronic kidney disease, stage 3 unspecified: Secondary | ICD-10-CM

## 2022-04-24 DIAGNOSIS — E1122 Type 2 diabetes mellitus with diabetic chronic kidney disease: Secondary | ICD-10-CM

## 2022-04-24 DIAGNOSIS — M1711 Unilateral primary osteoarthritis, right knee: Secondary | ICD-10-CM

## 2022-04-24 DIAGNOSIS — J4520 Mild intermittent asthma, uncomplicated: Secondary | ICD-10-CM

## 2022-04-24 NOTE — Progress Notes (Signed)
This encounter was created in error - please disregard.

## 2022-04-24 NOTE — Patient Outreach (Signed)
[facility]) Care Management  04/24/2022  MARIETTE COWLEY 1946/05/27 [id number]   Care Management   Follow Up Note   04/24/2022 Name: Hannah [name] MRN: [id number] DOB: 10-20-1945   Referred by: Glendale Chard, MD Reason for referral : Care Coordination (RN CC follow up call )   An unsuccessful telephone outreach was attempted today. The patient was referred to the case management team for assistance with care management and care coordination.   Follow Up Plan: Telephone follow up appointment with care management team member scheduled for: 06/15/22  Barb Merino, RN, BSN, CCM Care Management Coordinator [facility] Care Management Direct Phone: [phone]

## 2022-04-29 ENCOUNTER — Ambulatory Visit: Payer: Self-pay

## 2022-04-29 DIAGNOSIS — M6281 Muscle weakness (generalized): Secondary | ICD-10-CM | POA: Diagnosis not present

## 2022-04-29 DIAGNOSIS — M25562 Pain in left knee: Secondary | ICD-10-CM | POA: Diagnosis not present

## 2022-04-29 DIAGNOSIS — R2689 Other abnormalities of gait and mobility: Secondary | ICD-10-CM | POA: Diagnosis not present

## 2022-04-29 NOTE — Patient Outreach (Signed)
  Care Coordination   04/29/2022 Name: Hannah [name] MRN: [id number] DOB: 04-02-1946   Care Coordination Outreach Attempts:  A second unsuccessful outreach was attempted today to offer the patient with information about available care coordination services as a benefit of their health plan.     Follow Up Plan:  Additional outreach attempts will be made to offer the patient care coordination information and services.   Encounter Outcome:  No Answer  Care Coordination Interventions Activated:  No   Care Coordination Interventions:  No, not indicated    Barb Merino, RN, BSN, CCM Care Management Coordinator [facility] Care Management Direct Phone: [phone]

## 2022-04-29 NOTE — Progress Notes (Signed)
This encounter was created in error - please disregard.

## 2022-04-29 NOTE — Progress Notes (Signed)
Office Visit Note  Patient: Hannah [name]             Date of Birth: 01-17-1946           MRN: [id number]             PCP: Glendale Chard, MD Referring: Glendale Chard, MD Visit Date: 05/13/2022 Occupation: '@GUAROCC'$ @  Subjective:  Medication management  History of Present Illness: Hannah [name] is a 76 y.o. female with history of rheumatoid arthritis, systemic lupus, gout and osteoarthritis.  She underwent left total knee replacement on Feb 02, 2022 Dr. Maureen Ralphs.  She is gradually recovering from the surgery.  She is going to physical therapy twice a week.  She denies any flares of rheumatoid arthritis or lupus during this time.  She stayed on hydroxychloroquine 200 mg p.o. daily.  She did not have a flare of gout.  She has been taking allopurinol 100 mg daily.  She continues to have some pain and discomfort in her bilateral hands.  She is a still having discomfort in her left knee joint and has difficulty climbing stairs and getting up from the chair.  There is no history of oral ulcers, sicca symptoms, malar rash, Raynaud's phenomenon, photosensitivity or lymphadenopathy.  Activities of Daily Living:  Patient reports morning stiffness for 30 minutes.   Patient Reports nocturnal pain.  Difficulty dressing/grooming: Denies Difficulty climbing stairs: Reports Difficulty getting out of chair: Reports Difficulty using hands for taps, buttons, cutlery, and/or writing: Denies  Review of Systems  Constitutional:  Positive for fatigue.  HENT:  Negative for mouth sores and mouth dryness.   Eyes:  Positive for dryness.  Respiratory:  Negative for shortness of breath.   Cardiovascular:  Negative for chest pain and palpitations.  Gastrointestinal:  Negative for blood in stool, constipation and diarrhea.  Endocrine: Negative for increased urination.  Genitourinary:  Negative for involuntary urination.  Musculoskeletal:  Positive for joint pain, gait problem, joint pain, joint swelling  and morning stiffness. Negative for myalgias, muscle weakness, muscle tenderness and myalgias.  Skin:  Negative for color change, rash, hair loss and sensitivity to sunlight.  Allergic/Immunologic: Negative for susceptible to infections.  Neurological:  Negative for dizziness and headaches.  Hematological:  Negative for swollen glands.  Psychiatric/Behavioral:  Positive for sleep disturbance. Negative for depressed mood. The patient is not nervous/anxious.     PMFS History:  Patient Active Problem List   Diagnosis Date Noted   OA (osteoarthritis) of knee 02/02/2022   Primary osteoarthritis of left knee 02/02/2022   Chronic bilateral low back pain without sciatica 06/25/2021   Paresthesia and pain of extremity 06/25/2021   Atrial fibrillation ([place]) 04/16/2021   Demand ischemia ([place]) 03/26/2021   Unspecified atrial fibrillation ([place]) 03/26/2021   COVID-19 virus infection 03/26/2021   PVC (premature ventricular contraction) 05/06/2020   Type 2 diabetes mellitus with stage 3 chronic kidney disease, without long-term current use of insulin ([place]) 03/20/2020   Palpitations 03/20/2020   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/2020   Primary hypothyroidism 03/20/2020   Vitiligo 03/20/2020   Iron deficiency anemia 08/12/2017   Osteoporosis 10/02/2016   Systemic lupus erythematosus ([place]) 10/02/2016   Rheumatoid arthritis involving multiple sites with positive rheumatoid factor ([place]) 10/02/2016   High risk medication use 10/02/2016   History of chronic kidney disease 10/02/2016   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/2016   Primary osteoarthritis of both knees 10/02/2016   Vitamin D deficiency 10/02/2016   History of diabetes mellitus 10/02/2016  History of hypertension 10/02/2016   History of asthma 10/02/2016   Absolute anemia    MGUS (monoclonal gammopathy of unknown significance)    Normocytic anemia 03/21/2015   Essential hypertension 03/20/2015   Abnormal CT of the  chest 07/30/2012   Asthma 06/19/2012    Past Medical History:  Diagnosis Date   Anemia    Arthritis    Asthma    Atrial fibrillation ([place])    Chronic kidney disease    Diabetes mellitus without complication ([place])    Gout 12/17/2014   patient reported   Hyperlipidemia    Hypertension    Hypothyroidism    Systemic lupus erythematosus ([place])    Vitiligo     Family History  Problem Relation Age of Onset   Heart disease Father    Diabetes Father    Hypertension Mother    Hypertension Sister    Hypertension Sister    Leukemia Sister    Past Surgical History:  Procedure Laterality Date   CATARACT EXTRACTION Bilateral 2015   DILATION AND CURETTAGE OF UTERUS     DOPPLER ECHOCARDIOGRAPHY  05/2018   Internist to review with pt; potential heart murmur 06/20/18   keratosis removal  2021   SKIN SURGERY  11/30/2018   left side of face   TOOTH EXTRACTION     TOTAL KNEE ARTHROPLASTY Left 02/02/2022   Procedure: TOTAL KNEE ARTHROPLASTY;  Surgeon: Gaynelle Arabian, MD;  Location: [facility];  Service: Orthopedics;  Laterality: Left;   Social History   Social History Narrative   Not on file   Immunization History  Administered Date(s) Administered   19-influenza Whole 07/15/2012   DTaP 06/18/2019   Fluad Quad(high Dose 65+) 06/19/2020, 06/03/2021   Influenza Split 06/28/2014   Influenza, High Dose Seasonal PF 05/18/2019   Influenza,inj,Quad PF,6+ Mos 06/20/2018   Moderna SARS-COV2 Booster Vaccination 01/01/2021   Moderna Sars-Covid-2 Vaccination 11/20/2019, 12/19/2019, 08/06/2020, 01/01/2021, 08/13/2021   Pneumococcal Conjugate-13 05/04/2018   Pneumococcal Polysaccharide-23 05/18/2019   Pneumococcal-Unspecified 06/28/2014   Tdap 06/15/2019   Zoster Recombinat (Shingrix) 02/19/2021, 04/24/2021     Objective: Vital Signs: BP (!) 153/81 (BP Location: Left Arm, Patient Position: Sitting, Cuff Size: Normal)   Pulse 64   Resp 16   Ht '4\' 11"'$  (1.499 m)   Wt 162 lb 12.8 oz (73.8 kg)    BMI 32.88 kg/m    Physical Exam Vitals and nursing note reviewed.  Constitutional:      Appearance: She is well-developed.  HENT:     Head: Normocephalic and atraumatic.  Eyes:     Conjunctiva/sclera: Conjunctivae normal.  Cardiovascular:     Rate and Rhythm: Normal rate and regular rhythm.     Heart sounds: Normal heart sounds.  Pulmonary:     Effort: Pulmonary effort is normal.     Breath sounds: Normal breath sounds.  Abdominal:     General: Bowel sounds are normal.     Palpations: Abdomen is soft.  Musculoskeletal:     Cervical back: Normal range of motion.  Lymphadenopathy:     Cervical: No cervical adenopathy.  Skin:    General: Skin is warm and dry.     Capillary Refill: Capillary refill takes less than 2 seconds.  Neurological:     Mental Status: She is alert and oriented to person, place, and time.  Psychiatric:        Behavior: Behavior normal.      Musculoskeletal Exam: C-spine was in good range of motion.  Shoulder joints, elbow  joints, wrist joints, MCPs PIPs and DIPs with good range of motion.  She had bilateral PIP and DIP thickening but no synovitis was noted.  Hip joints were difficult to assess in the sitting position.  Right knee joint was in good range of motion.  Left knee joint was warm to touch and had a surgical scar.  She had limited extension.  Ankles were in good range of motion without any tenderness.  There was no tenderness over MTPs.  CDAI Exam: CDAI Score: 2.6  Patient Global: 3 mm; Provider Global: 3 mm Swollen: 1 ; Tender: 1  Joint Exam 05/13/2022      Right  Left  Knee     Swollen Tender     Investigation: No additional findings.  Imaging: No results found.  Recent Labs: Lab Results  Component Value Date   WBC 7.9 02/03/2022   HGB 9.2 (L) 02/03/2022   PLT 132 (L) 02/03/2022   NA 137 02/03/2022   K 4.5 02/03/2022   CL 108 02/03/2022   CO2 22 02/03/2022   GLUCOSE 149 (H) 02/03/2022   BUN 33 (H) 02/03/2022   CREATININE  1.17 (H) 02/03/2022   BILITOT 0.5 01/20/2022   ALKPHOS 45 01/20/2022   AST 20 01/20/2022   ALT 13 01/20/2022   PROT 6.5 01/20/2022   ALBUMIN 3.6 01/20/2022   CALCIUM 8.1 (L) 02/03/2022   GFRAA 44 (L) 12/19/2020    Speciality Comments: PLQ eye exam: 09/18/2021 WNL @ [facility] Follow up in 1 year  Procedures:  No procedures performed Allergies: Shellfish allergy   Assessment / Plan:     Visit Diagnoses: Rheumatoid arthritis involving multiple sites with positive rheumatoid factor (HCC) - Positive RF, positive anti-CCP: She has been taking hydroxychloroquine 200 mg p.o. daily.  She states she continues to have some joint stiffness and discomfort.  She did not develop increased joint swelling.  She had no interruption in hydroxychloroquine therapy.  Other systemic lupus erythematosus with other organ involvement (HCC) - Positive ANA, positive Smith, positive RNP: -She denies any history of oral ulcers, nasal ulcers, malar rash, Raynaud's phenomenon, inflammatory arthritis, photosensitivity or lymphadenopathy.  I will obtain autoimmune labs again today.  Plan: Protein / creatinine ratio, urine, Anti-DNA antibody, double-stranded, RNP, Smith, C3 and C4, Sedimentation rate  High risk medication use - Plaquenil 1 tablet by mouth daily.  PLQ eye exam: 09/18/2021  -Feb 03, 2022 her hemoglobin was low at 9.2, platelets were low at 132, creatinine was elevated at 1.17 and calcium was low which was most likely after the knee replacement surgery.  I will check labs today.  Plan: CBC with Differential/Platelet, COMPLETE METABOLIC PANEL WITH GFR  Idiopathic chronic gout of multiple sites without tophus - She is taking allopurinol 100 mg daily for management of gout. Uric acid was 3.8 on 01/08/21.   -She denies having gout flare.  Plan: Uric acid  Status post total knee replacement, left-she had left total knee replacement on Feb 02, 2022 by Dr. Maureen Ralphs.  She is going to physical therapy twice a week.  She  had limited extension.  Warmth and swelling was noted.  The scar is healing well.  Primary osteoarthritis of right knee -she continues to have discomfort in her right knee joint.  She plans to have right knee total arthroplasty in the future.  Chronic midline low back pain without sciatica-she complains of lower back pain most likely related to using the cane and favoring her left knee.  Stretching exercises  were demonstrated in the office.  I also advised her to discuss this further with the physical therapist.  Age-related osteoporosis without current pathological fracture - Last Prolia injection administered on 12/10/2021. DEXA updated on 08/09/20: Total right hip BMD 0.660 with T score -2.4. -She will be getting her next Prolia injection in September.  Will obtain labs today.  Plan: VITAMIN D 25 Hydroxy (Vit-D Deficiency, Fractures)  History of vitamin D deficiency -vitamin D was 42 on November 24, 2021.  We will check vitamin D level today.  Plan: VITAMIN D 25 Hydroxy (Vit-D Deficiency, Fractures)  MGUS (monoclonal gammopathy of unknown significance) - she is followed by Dr. Irene Limbo.    History of hypertension-blood pressure was elevated.  I advised her to monitor blood pressure closely and follow-up with her PCP.  Paroxysmal atrial fibrillation (HCC)-she is on Eliquis.  Chronic anticoagulation  History of diabetes mellitus  History of chronic kidney disease  History of asthma  History of anemia  Orders: Orders Placed This Encounter  Procedures   Protein / creatinine ratio, urine   CBC with Differential/Platelet   COMPLETE METABOLIC PANEL WITH GFR   Anti-DNA antibody, double-stranded   C3 and C4   Sedimentation rate   VITAMIN D 25 Hydroxy (Vit-D Deficiency, Fractures)   Uric acid   RNP Antibody   Anti-Smith antibody   No orders of the defined types were placed in this encounter.    Follow-Up Instructions: Return in about 5 months (around 10/13/2022) for Rheumatoid  arthritis, Systemic lupus.   Bo Merino, MD  Note - This record has been created using Editor, commissioning.  Chart creation errors have been sought, but may not always  have been located. Such creation errors do not reflect on  the standard of medical care.

## 2022-05-01 DIAGNOSIS — R2689 Other abnormalities of gait and mobility: Secondary | ICD-10-CM | POA: Diagnosis not present

## 2022-05-01 DIAGNOSIS — M25562 Pain in left knee: Secondary | ICD-10-CM | POA: Diagnosis not present

## 2022-05-01 DIAGNOSIS — M6281 Muscle weakness (generalized): Secondary | ICD-10-CM | POA: Diagnosis not present

## 2022-05-04 DIAGNOSIS — R2689 Other abnormalities of gait and mobility: Secondary | ICD-10-CM | POA: Diagnosis not present

## 2022-05-04 DIAGNOSIS — M25562 Pain in left knee: Secondary | ICD-10-CM | POA: Diagnosis not present

## 2022-05-04 DIAGNOSIS — M6281 Muscle weakness (generalized): Secondary | ICD-10-CM | POA: Diagnosis not present

## 2022-05-06 DIAGNOSIS — M6281 Muscle weakness (generalized): Secondary | ICD-10-CM | POA: Diagnosis not present

## 2022-05-06 DIAGNOSIS — R2689 Other abnormalities of gait and mobility: Secondary | ICD-10-CM | POA: Diagnosis not present

## 2022-05-06 DIAGNOSIS — M25562 Pain in left knee: Secondary | ICD-10-CM | POA: Diagnosis not present

## 2022-05-08 ENCOUNTER — Telehealth: Payer: Self-pay

## 2022-05-08 NOTE — Chronic Care Management (AMB) (Signed)
AZ&Me Notification:  Symbicort was shipped on 04/28/2022, allow 1-2 business days for shipment to arrive. Shipment was sent via [employer], Tracking number [id number].  Pattricia Boss, [place] Pharmacist Assistant [phone]

## 2022-05-09 DIAGNOSIS — Z1231 Encounter for screening mammogram for malignant neoplasm of breast: Secondary | ICD-10-CM | POA: Diagnosis not present

## 2022-05-13 ENCOUNTER — Ambulatory Visit: Payer: Medicare HMO | Attending: Rheumatology | Admitting: Rheumatology

## 2022-05-13 ENCOUNTER — Telehealth: Payer: Self-pay | Admitting: Rheumatology

## 2022-05-13 ENCOUNTER — Encounter: Payer: Self-pay | Admitting: Rheumatology

## 2022-05-13 VITALS — BP 153/81 | HR 64 | Resp 16 | Ht 59.0 in | Wt 162.8 lb

## 2022-05-13 DIAGNOSIS — Z862 Personal history of diseases of the blood and blood-forming organs and certain disorders involving the immune mechanism: Secondary | ICD-10-CM

## 2022-05-13 DIAGNOSIS — Z79899 Other long term (current) drug therapy: Secondary | ICD-10-CM

## 2022-05-13 DIAGNOSIS — Z8639 Personal history of other endocrine, nutritional and metabolic disease: Secondary | ICD-10-CM | POA: Diagnosis not present

## 2022-05-13 DIAGNOSIS — M0579 Rheumatoid arthritis with rheumatoid factor of multiple sites without organ or systems involvement: Secondary | ICD-10-CM

## 2022-05-13 DIAGNOSIS — Z96652 Presence of left artificial knee joint: Secondary | ICD-10-CM | POA: Diagnosis not present

## 2022-05-13 DIAGNOSIS — Z8679 Personal history of other diseases of the circulatory system: Secondary | ICD-10-CM

## 2022-05-13 DIAGNOSIS — M1A09X0 Idiopathic chronic gout, multiple sites, without tophus (tophi): Secondary | ICD-10-CM | POA: Diagnosis not present

## 2022-05-13 DIAGNOSIS — M545 Low back pain, unspecified: Secondary | ICD-10-CM | POA: Diagnosis not present

## 2022-05-13 DIAGNOSIS — Z87448 Personal history of other diseases of urinary system: Secondary | ICD-10-CM

## 2022-05-13 DIAGNOSIS — G8929 Other chronic pain: Secondary | ICD-10-CM

## 2022-05-13 DIAGNOSIS — M1711 Unilateral primary osteoarthritis, right knee: Secondary | ICD-10-CM | POA: Diagnosis not present

## 2022-05-13 DIAGNOSIS — I480 Paroxysmal atrial fibrillation: Secondary | ICD-10-CM

## 2022-05-13 DIAGNOSIS — M810 Age-related osteoporosis without current pathological fracture: Secondary | ICD-10-CM | POA: Diagnosis not present

## 2022-05-13 DIAGNOSIS — D472 Monoclonal gammopathy: Secondary | ICD-10-CM

## 2022-05-13 DIAGNOSIS — M3219 Other organ or system involvement in systemic lupus erythematosus: Secondary | ICD-10-CM | POA: Diagnosis not present

## 2022-05-13 DIAGNOSIS — Z7901 Long term (current) use of anticoagulants: Secondary | ICD-10-CM

## 2022-05-13 DIAGNOSIS — Z8709 Personal history of other diseases of the respiratory system: Secondary | ICD-10-CM

## 2022-05-13 DIAGNOSIS — M170 Bilateral primary osteoarthritis of knee: Secondary | ICD-10-CM

## 2022-05-13 NOTE — Patient Instructions (Signed)
Vaccines You are taking a medication(s) that can suppress your immune system.  The following immunizations are recommended: Flu annually Covid-19  Td/Tdap (tetanus, diphtheria, pertussis) every 10 years Pneumonia (Prevnar 15 then Pneumovax 23 at least 1 year apart.  Alternatively, can take Prevnar 20 without needing additional dose) Shingrix: 2 doses from 4 weeks to 6 months apart  Please check with your PCP to make sure you are up to date.  

## 2022-05-13 NOTE — Telephone Encounter (Signed)
Patient requested a return call to discuss patient assistance program for Prolia injection.

## 2022-05-14 ENCOUNTER — Other Ambulatory Visit (HOSPITAL_COMMUNITY): Payer: Self-pay

## 2022-05-14 LAB — PROTEIN / CREATININE RATIO, URINE
Creatinine, Urine: 190 mg/dL (ref 20–275)
Protein/Creat Ratio: 353 mg/g creat — ABNORMAL HIGH (ref 24–184)
Protein/Creatinine Ratio: 0.353 mg/mg creat — ABNORMAL HIGH (ref 0.024–0.184)
Total Protein, Urine: 67 mg/dL — ABNORMAL HIGH (ref 5–24)

## 2022-05-14 LAB — CBC WITH DIFFERENTIAL/PLATELET
Absolute Monocytes: 436 cells/uL (ref 200–950)
Basophils Absolute: 49 cells/uL (ref 0–200)
Basophils Relative: 1 %
Eosinophils Absolute: 549 cells/uL — ABNORMAL HIGH (ref 15–500)
Eosinophils Relative: 11.2 %
HCT: 28.4 % — ABNORMAL LOW (ref 35.0–45.0)
Hemoglobin: 9.2 g/dL — ABNORMAL LOW (ref 11.7–15.5)
Lymphs Abs: 833 cells/uL — ABNORMAL LOW (ref 850–3900)
MCH: 31.8 pg (ref 27.0–33.0)
MCHC: 32.4 g/dL (ref 32.0–36.0)
MCV: 98.3 fL (ref 80.0–100.0)
MPV: 12.4 fL (ref 7.5–12.5)
Monocytes Relative: 8.9 %
Neutro Abs: 3033 cells/uL (ref 1500–7800)
Neutrophils Relative %: 61.9 %
Platelets: 163 10*3/uL (ref 140–400)
RBC: 2.89 10*6/uL — ABNORMAL LOW (ref 3.80–5.10)
RDW: 13.4 % (ref 11.0–15.0)
Total Lymphocyte: 17 %
WBC: 4.9 10*3/uL (ref 3.8–10.8)

## 2022-05-14 LAB — VITAMIN D 25 HYDROXY (VIT D DEFICIENCY, FRACTURES): Vit D, 25-Hydroxy: 49 ng/mL (ref 30–100)

## 2022-05-14 LAB — COMPLETE METABOLIC PANEL WITH GFR
AG Ratio: 1.7 (calc) (ref 1.0–2.5)
ALT: 11 U/L (ref 6–29)
AST: 18 U/L (ref 10–35)
Albumin: 3.8 g/dL (ref 3.6–5.1)
Alkaline phosphatase (APISO): 50 U/L (ref 37–153)
BUN: 15 mg/dL (ref 7–25)
CO2: 28 mmol/L (ref 20–32)
Calcium: 9.2 mg/dL (ref 8.6–10.4)
Chloride: 107 mmol/L (ref 98–110)
Creat: 0.98 mg/dL (ref 0.60–1.00)
Globulin: 2.3 g/dL (calc) (ref 1.9–3.7)
Glucose, Bld: 89 mg/dL (ref 65–99)
Potassium: 4.1 mmol/L (ref 3.5–5.3)
Sodium: 141 mmol/L (ref 135–146)
Total Bilirubin: 0.3 mg/dL (ref 0.2–1.2)
Total Protein: 6.1 g/dL (ref 6.1–8.1)
eGFR: 60 mL/min/{1.73_m2} (ref >=60)

## 2022-05-14 LAB — ANTI-SMITH ANTIBODY: ENA SM Ab Ser-aCnc: <1 AI

## 2022-05-14 LAB — URIC ACID: Uric Acid, Serum: 4.4 mg/dL (ref 2.5–7.0)

## 2022-05-14 LAB — ANTI-DNA ANTIBODY, DOUBLE-STRANDED: ds DNA Ab: 35 IU/mL — ABNORMAL HIGH

## 2022-05-14 LAB — SEDIMENTATION RATE: Sed Rate: 17 mm/h (ref 0–30)

## 2022-05-14 LAB — C3 AND C4
C3 Complement: 122 mg/dL (ref 83–193)
C4 Complement: 39 mg/dL (ref 15–57)

## 2022-05-14 LAB — RNP ANTIBODY: Ribonucleic Protein(ENA) Antibody, IgG: <1 AI

## 2022-05-14 NOTE — Telephone Encounter (Signed)
Returned call to patient regarding Prolia. Unable to reach - left VM requesting return call  Per test claim, copay for Prolia through pharmacy benefit is $95 as last time. There are no grants for osteoporosis that have opened none that are curtently open.  He next Prolia is due on 06/08/22  Knox Saliva, PharmD, MPH, BCPS, CPP Clinical Pharmacist (Rheumatology and Pulmonology)

## 2022-05-15 ENCOUNTER — Telehealth: Payer: Self-pay | Admitting: Rheumatology

## 2022-05-15 ENCOUNTER — Ambulatory Visit: Payer: Self-pay

## 2022-05-15 ENCOUNTER — Other Ambulatory Visit (HOSPITAL_COMMUNITY): Payer: Self-pay

## 2022-05-15 DIAGNOSIS — R2689 Other abnormalities of gait and mobility: Secondary | ICD-10-CM | POA: Diagnosis not present

## 2022-05-15 DIAGNOSIS — M25562 Pain in left knee: Secondary | ICD-10-CM | POA: Diagnosis not present

## 2022-05-15 DIAGNOSIS — M6281 Muscle weakness (generalized): Secondary | ICD-10-CM | POA: Diagnosis not present

## 2022-05-15 MED ORDER — DENOSUMAB 60 MG/ML ~~LOC~~ SOSY
60.0000 mg | PREFILLED_SYRINGE | SUBCUTANEOUS | 0 refills | Status: DC
  Filled 2022-05-15 – 2022-05-26 (×2): qty 1, 180d supply, fill #0

## 2022-05-15 NOTE — Telephone Encounter (Signed)
Patient left a voicemail requesting a call back from [facility] regarding her Prolia. Patient states if someone could reach out today she will be available after 3pm.

## 2022-05-15 NOTE — Telephone Encounter (Signed)
Rx for Prolia sent to [facility] to be couriered to clinic by 06/09/22  CBC CMP Vitamin D on 05/13/22 wnl to proceed.  Knox Saliva, PharmD, MPH, BCPS, CPP Clinical Pharmacist (Rheumatology and Pulmonology)

## 2022-05-15 NOTE — Patient Instructions (Signed)
Visit Information  Thank you for taking time to visit with me today. Please don't hesitate to contact me if I can be of assistance to you.   Following are the goals we discussed today:   Goals Addressed       Patient Stated     I hope to acheive better range of motion in my left knee (pt-stated)        Care Coordination Interventions: Evaluation of current treatment plan related to osteoarthritis of left knee and status post left knee arthroplasty  and patient's adherence to plan as established by provider Provided education to patient re: importance of adherence of following HEP and stretching exercises Reviewed scheduled/upcoming provider appointments including: f/u with Dr. Wynelle Link, orthopedic surgeon to discuss revision of left knee  Advised patient to discuss decreased ROM to left knee with provider Encouraged patient to stay well hydrated and balance her activity with rest Provided FDA COVID 19 vaccine guidelines Advised patient to discuss recommendations for COVID 19 booster vaccine with provider Instructed patient to report new symptoms or concerns to her doctor  Assessed social determinant of health barriers    Our next appointment is by telephone on 08/05/22 at 09:00 AM  Please call the care guide team at [phone] if you need to cancel or reschedule your appointment.   If you are experiencing a Mental Health or [place] or need someone to talk to, please call 1-800-273-TALK (toll free, 24 hour hotline)  Patient verbalizes understanding of instructions and care plan provided today and agrees to view in [place]. Active MyChart status and patient understanding of how to access instructions and care plan via MyChart confirmed with patient.     Barb Merino, RN, BSN, CCM Care Management Coordinator [facility] Care Management  Direct Phone: [phone]

## 2022-05-15 NOTE — Telephone Encounter (Signed)
Called patient and advised that there are no Prolia grants open at this time and reivewed copay. She will pay $95 copay. CC should be on file with [facility]  Knox Saliva, PharmD, MPH, BCPS, CPP Clinical Pharmacist (Rheumatology and Pulmonology)

## 2022-05-15 NOTE — Patient Outreach (Signed)
  Care Coordination   05/15/2022 Name: Hannah [name] MRN: [id number] DOB: 1946-01-29   Care Coordination Outreach Attempts:  A third unsuccessful outreach was attempted today to offer the patient with information about available care coordination services as a benefit of their health plan.   Follow Up Plan:  No further outreach attempts will be made at this time. We have been unable to contact the patient to offer or enroll patient in care coordination services  Encounter Outcome:  No Answer  Care Coordination Interventions Activated:  No   Care Coordination Interventions:  No, not indicated    Barb Merino, RN, BSN, CCM Care Management Coordinator [place] Management  Direct Phone: [phone]

## 2022-05-15 NOTE — Patient Outreach (Signed)
  Care Coordination   Follow Up Visit Note   05/15/2022 Name: Hannah [name] MRN: [id number] DOB: 31-Jan-1946  Hannah [name] is a 76 y.o. year old female who sees Glendale Chard, MD for primary care. I spoke with  [name] by phone today  What matters to the patients health and wellness today?  Patient would like to improve the range of motion to her left knee following recent knee surgery.     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 Wynelle Link, orthopedic surgeon to discuss revision of left knee  Advised patient to discuss decreased ROM to left knee with provider Encouraged patient to stay well hydrated and balance her activity with rest Provided FDA COVID 19 vaccine guidelines Advised patient to discuss recommendations for COVID 19 booster vaccine with provider Instructed patient to report new symptoms or concerns to her doctor  Assessed social determinant of health barriers   SDOH assessments and interventions completed:  Yes     Care Coordination Interventions Activated:  Yes  Care Coordination Interventions:  Yes, provided   Follow up plan: Follow up call scheduled for 08/05/22 '@09'$ :00 AM    Encounter Outcome:  Pt. Visit Completed

## 2022-05-18 DIAGNOSIS — M6281 Muscle weakness (generalized): Secondary | ICD-10-CM | POA: Diagnosis not present

## 2022-05-18 DIAGNOSIS — R2689 Other abnormalities of gait and mobility: Secondary | ICD-10-CM | POA: Diagnosis not present

## 2022-05-18 DIAGNOSIS — M25562 Pain in left knee: Secondary | ICD-10-CM | POA: Diagnosis not present

## 2022-05-19 ENCOUNTER — Other Ambulatory Visit (HOSPITAL_COMMUNITY): Payer: Self-pay

## 2022-05-20 DIAGNOSIS — M25562 Pain in left knee: Secondary | ICD-10-CM | POA: Diagnosis not present

## 2022-05-20 DIAGNOSIS — R2689 Other abnormalities of gait and mobility: Secondary | ICD-10-CM | POA: Diagnosis not present

## 2022-05-20 DIAGNOSIS — M6281 Muscle weakness (generalized): Secondary | ICD-10-CM | POA: Diagnosis not present

## 2022-05-25 DIAGNOSIS — M6281 Muscle weakness (generalized): Secondary | ICD-10-CM | POA: Diagnosis not present

## 2022-05-25 DIAGNOSIS — R2689 Other abnormalities of gait and mobility: Secondary | ICD-10-CM | POA: Diagnosis not present

## 2022-05-25 DIAGNOSIS — M25562 Pain in left knee: Secondary | ICD-10-CM | POA: Diagnosis not present

## 2022-05-26 ENCOUNTER — Other Ambulatory Visit (HOSPITAL_COMMUNITY): Payer: Self-pay

## 2022-05-27 DIAGNOSIS — R2689 Other abnormalities of gait and mobility: Secondary | ICD-10-CM | POA: Diagnosis not present

## 2022-05-27 DIAGNOSIS — M25562 Pain in left knee: Secondary | ICD-10-CM | POA: Diagnosis not present

## 2022-05-27 DIAGNOSIS — M6281 Muscle weakness (generalized): Secondary | ICD-10-CM | POA: Diagnosis not present

## 2022-05-28 DIAGNOSIS — M1711 Unilateral primary osteoarthritis, right knee: Secondary | ICD-10-CM | POA: Diagnosis not present

## 2022-05-28 DIAGNOSIS — M170 Bilateral primary osteoarthritis of knee: Secondary | ICD-10-CM | POA: Diagnosis not present

## 2022-05-29 NOTE — Progress Notes (Signed)
Pharmacy Note  Subjective:   Patient presents to clinic today to receive bi-annual dose of Prolia. Patient's last dose of Prolia was on 12/10/21  Patient running a fever or have signs/symptoms of infection? No  Patient currently on antibiotics for the treatment of infection? No  Patient had fall in the last 6 months?  No   Patient taking calcium 1200 mg daily through diet or supplement and at least 800 units vitamin D? Yes  Objective: CMP     Component Value Date/Time   NA 141 05/13/2022 1519   NA 142 12/31/2021 1144   NA 142 08/12/2017 0808   K 4.1 05/13/2022 1519   K 3.7 08/12/2017 0808   CL 107 05/13/2022 1519   CO2 28 05/13/2022 1519   CO2 27 08/12/2017 0808   GLUCOSE 89 05/13/2022 1519   GLUCOSE 85 08/12/2017 0808   BUN 15 05/13/2022 1519   BUN 18 12/31/2021 1144   BUN 20.8 08/12/2017 0808   CREATININE 0.98 05/13/2022 1519   CREATININE 1.4 (H) 08/12/2017 0808   CALCIUM 9.2 05/13/2022 1519   CALCIUM 10.3 08/12/2017 0808   PROT 6.1 05/13/2022 1519   PROT 6.7 12/31/2021 1144   PROT 6.8 08/12/2017 0808   ALBUMIN 3.6 01/20/2022 0817   ALBUMIN 4.3 12/31/2021 1144   ALBUMIN 3.7 08/12/2017 0808   AST 18 05/13/2022 1519   AST 22 07/14/2021 0812   AST 31 08/12/2017 0808   ALT 11 05/13/2022 1519   ALT 18 07/14/2021 0812   ALT 38 08/12/2017 0808   ALKPHOS 45 01/20/2022 0817   ALKPHOS 51 08/12/2017 0808   BILITOT 0.3 05/13/2022 1519   BILITOT 0.5 12/31/2021 1144   BILITOT 0.6 07/14/2021 0812   BILITOT 0.45 08/12/2017 0808   GFRNONAA 48 (L) 02/03/2022 0332   GFRNONAA 51 (L) 07/14/2021 0812   GFRNONAA 38 (L) 12/19/2020 1426   GFRAA 44 (L) 12/19/2020 1426    CBC    Component Value Date/Time   WBC 4.9 05/13/2022 1519   RBC 2.89 (L) 05/13/2022 1519   HGB 9.2 (L) 05/13/2022 1519   HGB 10.6 (L) 12/31/2021 1144   HGB 9.9 (L) 08/12/2017 0808   HCT 28.4 (L) 05/13/2022 1519   HCT 31.9 (L) 12/31/2021 1144   HCT 30.7 (L) 08/12/2017 0808   PLT 163 05/13/2022 1519   PLT  145 (L) 12/31/2021 1144   MCV 98.3 05/13/2022 1519   MCV 94 12/31/2021 1144   MCV 97.5 08/12/2017 0808   MCH 31.8 05/13/2022 1519   MCHC 32.4 05/13/2022 1519   RDW 13.4 05/13/2022 1519   RDW 13.1 12/31/2021 1144   RDW 14.0 08/12/2017 0808   LYMPHSABS 833 (L) 05/13/2022 1519   LYMPHSABS 0.8 04/08/2021 1629   LYMPHSABS 0.9 08/12/2017 0808   MONOABS 0.4 07/14/2021 0812   MONOABS 0.3 08/12/2017 0808   EOSABS 549 (H) 05/13/2022 1519   EOSABS 0.2 04/08/2021 1629   BASOSABS 49 05/13/2022 1519   BASOSABS 0.0 04/08/2021 1629   BASOSABS 0.0 08/12/2017 0808    Lab Results  Component Value Date   VD25OH 49 05/13/2022    T-score: DEXA updated on 08/09/20: Total right hip BMD 0.660 with T score -2.4.  Due to update DXA November 2023  Assessment/Plan:   Reviewed importance of adequate dietary intake of calcium in addition to supplementation due to risk of hypocalcemia with Prolia.   Patient tolerated injection without issue.  Administrations This Visit     denosumab (PROLIA) injection 60 mg  Admin Date 06/09/2022 Action Given Dose 60 mg Route Subcutaneous Administered By Cassandria Anger, RPH-CPP           Patient's next Prolia dose is due on 12/06/22.  Patient is due for updated DEXA in November 2023. Order placed today with [facility]   All questions encouraged and answered.  Instructed patient to call with any further questions or concerns.  Knox Saliva, PharmD, MPH, BCPS, CPP Clinical Pharmacist (Rheumatology and Pulmonology)

## 2022-06-01 ENCOUNTER — Other Ambulatory Visit (HOSPITAL_COMMUNITY): Payer: Self-pay

## 2022-06-03 ENCOUNTER — Other Ambulatory Visit (HOSPITAL_COMMUNITY): Payer: Self-pay

## 2022-06-03 NOTE — Telephone Encounter (Signed)
Prolia received from [facility]. Placed in refrigerator  Tranika Scholler, PharmD, MPH, BCPS, CPP Clinical Pharmacist (Rheumatology and Pulmonology) 

## 2022-06-08 DIAGNOSIS — M25562 Pain in left knee: Secondary | ICD-10-CM | POA: Diagnosis not present

## 2022-06-08 DIAGNOSIS — M6281 Muscle weakness (generalized): Secondary | ICD-10-CM | POA: Diagnosis not present

## 2022-06-08 DIAGNOSIS — R2689 Other abnormalities of gait and mobility: Secondary | ICD-10-CM | POA: Diagnosis not present

## 2022-06-09 ENCOUNTER — Ambulatory Visit: Payer: Medicare HMO | Attending: Rheumatology | Admitting: Pharmacist

## 2022-06-09 DIAGNOSIS — Z7689 Persons encountering health services in other specified circumstances: Secondary | ICD-10-CM | POA: Diagnosis not present

## 2022-06-09 DIAGNOSIS — Z79899 Other long term (current) drug therapy: Secondary | ICD-10-CM | POA: Diagnosis not present

## 2022-06-09 DIAGNOSIS — M810 Age-related osteoporosis without current pathological fracture: Secondary | ICD-10-CM | POA: Diagnosis not present

## 2022-06-09 MED ORDER — DENOSUMAB 60 MG/ML ~~LOC~~ SOSY
60.0000 mg | PREFILLED_SYRINGE | Freq: Once | SUBCUTANEOUS | Status: AC
  Administered 2022-06-09: 60 mg via SUBCUTANEOUS

## 2022-06-15 ENCOUNTER — Other Ambulatory Visit: Payer: Self-pay | Admitting: Rheumatology

## 2022-06-15 DIAGNOSIS — M0579 Rheumatoid arthritis with rheumatoid factor of multiple sites without organ or systems involvement: Secondary | ICD-10-CM

## 2022-06-15 NOTE — Telephone Encounter (Signed)
Next Visit: 10/15/2022  Last Visit: 05/13/2022  Labs: 05/16/2022 CMP WNL CBC RBC 2.89, Hemoglobin 9.2, HCT 28.4, Lymphs 833, Eosinophils Absolute 549  Eye exam: 09/18/2021 WNL   Current Dose per office note 05/13/2022: hydroxychloroquine 200 mg p.o. daily  WO:[id number] arthritis involving multiple sites with positive rheumatoid factor   Last Fill: 03/19/2022  Okay to refill Plaquenil?

## 2022-06-19 DIAGNOSIS — R2689 Other abnormalities of gait and mobility: Secondary | ICD-10-CM | POA: Diagnosis not present

## 2022-06-19 DIAGNOSIS — M25562 Pain in left knee: Secondary | ICD-10-CM | POA: Diagnosis not present

## 2022-06-19 DIAGNOSIS — M6281 Muscle weakness (generalized): Secondary | ICD-10-CM | POA: Diagnosis not present

## 2022-06-22 ENCOUNTER — Other Ambulatory Visit: Payer: Self-pay | Admitting: *Deleted

## 2022-06-22 DIAGNOSIS — M6281 Muscle weakness (generalized): Secondary | ICD-10-CM | POA: Diagnosis not present

## 2022-06-22 DIAGNOSIS — R2689 Other abnormalities of gait and mobility: Secondary | ICD-10-CM | POA: Diagnosis not present

## 2022-06-22 DIAGNOSIS — D472 Monoclonal gammopathy: Secondary | ICD-10-CM

## 2022-06-22 DIAGNOSIS — M25562 Pain in left knee: Secondary | ICD-10-CM | POA: Diagnosis not present

## 2022-06-22 DIAGNOSIS — D649 Anemia, unspecified: Secondary | ICD-10-CM

## 2022-06-23 ENCOUNTER — Inpatient Hospital Stay: Payer: Medicare HMO | Attending: Hematology

## 2022-06-23 ENCOUNTER — Other Ambulatory Visit: Payer: Self-pay

## 2022-06-23 DIAGNOSIS — D472 Monoclonal gammopathy: Secondary | ICD-10-CM | POA: Insufficient documentation

## 2022-06-23 DIAGNOSIS — D649 Anemia, unspecified: Secondary | ICD-10-CM | POA: Insufficient documentation

## 2022-06-23 LAB — CBC WITH DIFFERENTIAL (CANCER CENTER ONLY)
Abs Immature Granulocytes: 0.01 10*3/uL (ref 0.00–0.07)
Basophils Absolute: 0 10*3/uL (ref 0.0–0.1)
Basophils Relative: 1 %
Eosinophils Absolute: 0.5 10*3/uL (ref 0.0–0.5)
Eosinophils Relative: 10 %
HCT: 27.8 % — ABNORMAL LOW (ref 36.0–46.0)
Hemoglobin: 9 g/dL — ABNORMAL LOW (ref 12.0–15.0)
Immature Granulocytes: 0 %
Lymphocytes Relative: 16 %
Lymphs Abs: 0.8 10*3/uL (ref 0.7–4.0)
MCH: 31.3 pg (ref 26.0–34.0)
MCHC: 32.4 g/dL (ref 30.0–36.0)
MCV: 96.5 fL (ref 80.0–100.0)
Monocytes Absolute: 0.4 10*3/uL (ref 0.1–1.0)
Monocytes Relative: 8 %
Neutro Abs: 3.2 10*3/uL (ref 1.7–7.7)
Neutrophils Relative %: 65 %
Platelet Count: 160 10*3/uL (ref 150–400)
RBC: 2.88 MIL/uL — ABNORMAL LOW (ref 3.87–5.11)
RDW: 15 % (ref 11.5–15.5)
WBC Count: 5 10*3/uL (ref 4.0–10.5)
nRBC: 0 % (ref 0.0–0.2)

## 2022-06-23 LAB — IRON AND IRON BINDING CAPACITY (CC-WL,HP ONLY)
Iron: 35 ug/dL (ref 28–170)
Saturation Ratios: 10 % — ABNORMAL LOW (ref 10.4–31.8)
TIBC: 336 ug/dL (ref 250–450)
UIBC: 301 ug/dL (ref 148–442)

## 2022-06-23 LAB — CMP (CANCER CENTER ONLY)
ALT: 14 U/L (ref 0–44)
AST: 18 U/L (ref 15–41)
Albumin: 3.7 g/dL (ref 3.5–5.0)
Alkaline Phosphatase: 53 U/L (ref 38–126)
Anion gap: 4 — ABNORMAL LOW (ref 5–15)
BUN: 23 mg/dL (ref 8–23)
CO2: 28 mmol/L (ref 22–32)
Calcium: 9 mg/dL (ref 8.9–10.3)
Chloride: 109 mmol/L (ref 98–111)
Creatinine: 1.05 mg/dL — ABNORMAL HIGH (ref 0.44–1.00)
GFR, Estimated: 55 mL/min — ABNORMAL LOW (ref >60)
Glucose, Bld: 95 mg/dL (ref 70–99)
Potassium: 3.9 mmol/L (ref 3.5–5.1)
Sodium: 141 mmol/L (ref 135–145)
Total Bilirubin: 0.5 mg/dL (ref 0.3–1.2)
Total Protein: 6.4 g/dL — ABNORMAL LOW (ref 6.5–8.1)

## 2022-06-24 NOTE — Progress Notes (Addendum)
COVID Vaccine Completed:  Yes  Date of COVID positive in last 90 days:  No  PCP - Glendale Chard, MD Cardiologist - Mertie Moores, MD Pulmonologist - Christinia Gully, MD (asthma)  Chest x-ray - N/A EKG - 10-15-21 Epic Stress Test - N/A ECHO - 03-27-21 Epic Cardiac Cath - N/A Pacemaker/ICD device last checked: Spinal Cord Stimulator:  N/A Cardiac Monitor - 2022 Epic  Bowel Prep - N/A  Sleep Study -  Yes, neg sleep apnea CPAP - No  Fasting Blood Sugar - 80 to 88 Checks Blood Sugar - 3 to 4 tmes a week  Blood Thinner Instructions:  Eliquis.  Patient will check with Dr. Wynelle Link to see if she needs to hold Aspirin Instructions: Last Dose:  Activity level:   Can go up a flight of stairs and perform activities of daily living without stopping and without symptoms of chest pain or shortness of breath.  Able to exercise without symptoms  Anesthesia review: Afib, HTN, DM,.lupus, CKD  Patient denies shortness of breath, fever, cough and chest pain at PAT appointment  (completed over the phone)  Patient verbalized understanding of instructions that were given to them at the PAT appointment. Patient was also instructed that they will need to review over the PAT instructions again at home before surgery.

## 2022-06-24 NOTE — Patient Instructions (Addendum)
SURGICAL WAITING ROOM VISITATION Patients having surgery or a procedure may have no more than 2 support people in the waiting area - these visitors may rotate.   Children under the age of 33 must have an adult with them who is not the patient. If the patient needs to stay at the hospital during part of their recovery, the visitor guidelines for inpatient rooms apply. Pre-op nurse will coordinate an appropriate time for 1 support person to accompany patient in pre-op.  This support person may not rotate.    Please refer to the [facility] website for the visitor guidelines for Inpatients (after your surgery is over and you are in a regular room).      Your procedure is scheduled on: 07-06-22   Report to [facility] Main Entrance    Report to admitting at 2:00 PM   Call this number if you have problems the morning of surgery [phone]   Do not eat food :After Midnight.   After Midnight you may have the following liquids until 1:15 PM DAY OF SURGERY  Water Non-Citrus Juices (without pulp, NO RED) Carbonated Beverages Black Coffee (NO MILK/CREAM OR CREAMERS, sugar ok)  Clear Tea (NO MILK/CREAM OR CREAMERS, sugar ok) regular and decaf                             Plain Jell-O (NO RED)                                           Fruit ices (not with fruit pulp, NO RED)                                     Popsicles (NO RED)                                                               Sports drinks like Gatorade (NO RED)                    If you have questions, please contact your surgeon's office.   FOLLOWANY ADDITIONAL PRE OP INSTRUCTIONS YOU RECEIVED FROM YOUR SURGEON'S OFFICE!!!     Oral Hygiene is also important to reduce your risk of infection.                                    Remember - BRUSH YOUR TEETH THE MORNING OF SURGERY WITH YOUR REGULAR TOOTHPASTE   Do NOT smoke after Midnight   Take these medicines the morning of surgery with A SIP OF WATER:    Allopurinol  Amlodipine  Atorvastatin  Metoprolol  Synthroid  Inhalers                              You may not have any metal on your body including hair pins, jewelry, and body piercing             Do  not wear make-up, lotions, powders, perfumes, or deodorant  Do not wear nail polish including gel and S&S, artificial/acrylic nails, or any other type of covering on natural nails including finger and toenails. If you have artificial nails, gel coating, etc. that needs to be removed by a nail salon please have this removed prior to surgery or surgery may need to be canceled/ delayed if the surgeon/ anesthesia feels like they are unable to be safely monitored.   Do not shave  48 hours prior to surgery.    Do not bring valuables to the hospital. [place].   Contacts, dentures or bridgework may not be worn into surgery.  DO NOT [place]. PHARMACY WILL DISPENSE MEDICATIONS LISTED ON YOUR MEDICATION LIST TO YOU DURING YOUR ADMISSION [place]!   Patients discharged on the day of surgery will not be allowed to drive home.  Someone NEEDS to stay with you for the first 24 hours after anesthesia.  Special Instructions: Bring a copy of your healthcare power of attorney and living will documents the day of surgery if you haven't scanned them before.  Please read over the following fact sheets you were given: IF [place]  If you received a COVID test during your pre-op visit  it is requested that you wear a mask when out in public, stay away from anyone that may not be feeling well and notify your surgeon if you develop symptoms. If you test positive for Covid or have been in contact with anyone that has tested positive in the last 10 days please notify you surgeon.  [place] - Preparing for Surgery Before surgery, you can play an important  role.  Because skin is not sterile, your skin needs to be as free of germs as possible.  You can reduce the number of germs on your skin by washing with CHG (chlorahexidine gluconate) soap before surgery.  CHG is an antiseptic cleaner which kills germs and bonds with the skin to continue killing germs even after washing. Please DO NOT use if you have an allergy to CHG or antibacterial soaps.  If your skin becomes reddened/irritated stop using the CHG and inform your nurse when you arrive at Short Stay. Do not shave (including legs and underarms) for at least 48 hours prior to the first CHG shower.  You may shave your face/neck.  Please follow these instructions carefully:  1.  Shower with CHG Soap the night before surgery and the  morning of surgery.  2.  If you choose to wash your hair, wash your hair first as usual with your normal  shampoo.  3.  After you shampoo, rinse your hair and body thoroughly to remove the shampoo.                             4.  Use CHG as you would any other liquid soap.  You can apply chg directly to the skin and wash.  Gently with a scrungie or clean washcloth.  5.  Apply the CHG Soap to your body ONLY FROM THE NECK DOWN.   Do   not use on face/ open                           Wound or open sores. Avoid  contact with eyes, ears mouth and   genitals (private parts).                       Wash face,  Genitals (private parts) with your normal soap.             6.  Wash thoroughly, paying special attention to the area where your    surgery  will be performed.  7.  Thoroughly rinse your body with warm water from the neck down.  8.  DO NOT shower/wash with your normal soap after using and rinsing off the CHG Soap.                9.  Pat yourself dry with a clean towel.            10.  Wear clean pajamas.            11.  Place clean sheets on your bed the night of your first shower and do not  sleep with pets. Day of Surgery : Do not apply any lotions/deodorants the morning  of surgery.  Please wear clean clothes to the hospital/surgery center.  FAILURE TO FOLLOW THESE INSTRUCTIONS MAY RESULT IN THE CANCELLATION OF YOUR SURGERY  PATIENT SIGNATURE_________________________________  NURSE SIGNATURE__________________________________  ________________________________________________________________________

## 2022-06-26 ENCOUNTER — Encounter (HOSPITAL_COMMUNITY): Payer: Self-pay

## 2022-06-26 ENCOUNTER — Other Ambulatory Visit: Payer: Self-pay

## 2022-06-26 ENCOUNTER — Encounter (HOSPITAL_COMMUNITY)
Admission: RE | Admit: 2022-06-26 | Discharge: 2022-06-26 | Disposition: A | Discharge status: Home / Self Care | Payer: Medicare HMO | Source: Ambulatory Visit | Attending: Orthopedic Surgery | Admitting: Orthopedic Surgery

## 2022-06-26 VITALS — Ht 59.0 in | Wt 162.0 lb

## 2022-06-26 DIAGNOSIS — E119 Type 2 diabetes mellitus without complications: Secondary | ICD-10-CM

## 2022-06-26 DIAGNOSIS — Z01818 Encounter for other preprocedural examination: Secondary | ICD-10-CM

## 2022-06-27 ENCOUNTER — Ambulatory Visit (INDEPENDENT_AMBULATORY_CARE_PROVIDER_SITE_OTHER): Payer: Medicare HMO

## 2022-06-27 DIAGNOSIS — Z0000 Encounter for general adult medical examination without abnormal findings: Secondary | ICD-10-CM

## 2022-06-27 NOTE — Patient Instructions (Signed)

## 2022-06-27 NOTE — Progress Notes (Signed)
Subjective:   Hannah [name] is a 76 y.o. female who presents for Medicare Annual (Subsequent) preventive examination.  I connected with  [name] on 06/27/22 by a audio enabled telemedicine application and verified that I am speaking with the correct person using two identifiers.  Patient Location: Home  Provider Location: Office/Clinic  I discussed the limitations of evaluation and management by telemedicine. The patient expressed understanding and agreed to proceed.       Objective:    There were no vitals filed for this visit. There is no height or weight on file to calculate BMI.     06/27/2022    9:49 AM 06/26/2022    9:16 AM 02/02/2022    7:56 AM 01/20/2022    8:02 AM 06/25/2021   11:18 AM 03/26/2021    9:10 PM 03/26/2021    1:43 PM  Advanced Directives  Does Patient Have a Medical Advance Directive? Yes Yes Yes Yes Yes Yes Yes  Type of Corporate treasurer of [place];Living will [place];Living will [place];Living will [place];Living will Healthcare Power of [place];Living will  Does patient want to make changes to medical advance directive?   No - Patient declined   No - Patient declined   Copy of [place] in Chart? Yes - validated most recent copy scanned in chart (See row information) Yes - validated most recent copy scanned in chart (See row information) Yes - validated most recent copy scanned in chart (See row information) No - copy requested No - copy requested No - copy requested     Current Medications (verified) Outpatient Encounter Medications as of 06/27/2022  Medication Sig   albuterol (PROVENTIL HFA;VENTOLIN HFA) 108 (90 Base) MCG/ACT inhaler Inhale 2 puffs into the lungs every 6 (six) hours as needed for wheezing or shortness of breath.   methocarbamol (ROBAXIN) 500 MG tablet Take 1 tablet (500 mg total) by mouth every 6  (six) hours as needed for muscle spasms.   oxyCODONE (OXY IR/ROXICODONE) 5 MG immediate release tablet Take 1-2 tablets (5-10 mg total) by mouth every 6 (six) hours as needed for severe pain.   traMADol (ULTRAM) 50 MG tablet Take 1-2 tablets (50-100 mg total) by mouth every 6 (six) hours as needed for moderate pain.   Accu-Chek Softclix Lancets lancets Use to check blood sugars daily E11.69   Alcohol Swabs (ALCOHOL PADS) 70 % PADS Use as directed to check blood sugars 1 time per day dx: e11.22   allopurinol (ZYLOPRIM) 100 MG tablet TAKE 1 TABLET BY MOUTH EVERY DAY FOR GOUT   amLODipine (NORVASC) 2.5 MG tablet TAKE 1 TABLET(2.5 MG) BY MOUTH DAILY   apixaban (ELIQUIS) 5 MG TABS tablet Take 1 tablet (5 mg total) by mouth 2 (two) times daily.   Ascorbic Acid (VITAMIN C) 1000 MG tablet Take 1,000 mg by mouth daily.   atorvastatin (LIPITOR) 80 MG tablet TAKE 1 TABLET BY MOUTH EVERY DAY   budesonide-formoterol (SYMBICORT) 160-4.5 MCG/ACT inhaler INHALE 2 PUFFS BY MOUTH TWICE DAILY IN THE MORNING AND IN THE EVENING (Patient taking differently: Inhale 2 puffs into the lungs daily as needed (shortness of breath or wheezing).)   Calcium Carb-Cholecalciferol 600-800 MG-UNIT TABS Take 1 tablet by mouth daily.    Cholecalciferol (VITAMIN D PO) Take 2,000 Units by mouth daily.   cyanocobalamin 1000 MCG tablet Take 1,000 mcg by mouth daily.   denosumab (PROLIA) 60  MG/ML SOSY injection Inject 60 mg into the skin every 6 (six) months. Courier to rheum: [address], [place], [place] [address]. Appt on 06/09/22   glucose blood (ACCU-CHEK GUIDE) test strip Use to check blood sugars daily E11.69   hydroxychloroquine (PLAQUENIL) 200 MG tablet TAKE 1 TABLET(200 MG) BY MOUTH DAILY   Hypromellose (ARTIFICIAL TEARS OP) Apply 1 drop to eye daily as needed (dry eyes).   irbesartan (AVAPRO) 300 MG tablet TAKE 1 TABLET(300 MG) BY MOUTH DAILY   iron polysaccharides (NIFEREX) 150 MG capsule Take 150 mg by mouth daily.    metoprolol succinate (TOPROL-XL) 25 MG 24 hr tablet Take 1 tablet (25 mg total) by mouth daily.   SYNTHROID 88 MCG tablet TAKE 1 TABLET BY MOUTH EVERY DAY MONDAY TO SATURDAY AND OFF ON SUNDAYS   valsartan-hydrochlorothiazide (DIOVAN-HCT) 160-25 MG tablet Take 1 tablet by mouth daily.   No facility-administered encounter medications on file as of 06/27/2022.    Allergies (verified) Shellfish allergy   History: Past Medical History:  Diagnosis Date   Anemia    Arthritis    Asthma    Atrial fibrillation ([place])    Chronic kidney disease    Diabetes mellitus without complication ([place])    Gout 12/17/2014   patient reported   Hyperlipidemia    Hypertension    Hypothyroidism    Systemic lupus erythematosus ([place])    Vitiligo    Past Surgical History:  Procedure Laterality Date   CATARACT EXTRACTION Bilateral 2015   DILATION AND CURETTAGE OF UTERUS     DOPPLER ECHOCARDIOGRAPHY  05/2018   Internist to review with pt; potential heart murmur 06/20/18   keratosis removal  2021   SKIN SURGERY  11/30/2018   left side of face   TOOTH EXTRACTION     TOTAL KNEE ARTHROPLASTY Left 02/02/2022   Procedure: TOTAL KNEE ARTHROPLASTY;  Surgeon: Gaynelle Arabian, MD;  Location: [facility];  Service: Orthopedics;  Laterality: Left;   Family History  Problem Relation Age of Onset   Heart disease Father    Diabetes Father    Hypertension Mother    Hypertension Sister    Hypertension Sister    Leukemia Sister    Social History   Socioeconomic History   Marital status: Single    Spouse name: Not on file   Number of children: 0   Years of education: Not on file   Highest education level: Not on file  Occupational History   Occupation: retired  Tobacco Use   Smoking status: Never    Passive exposure: Never   Smokeless tobacco: Never  Vaping Use   Vaping Use: Never used  Substance and Sexual Activity   Alcohol use: Not Currently   Drug use: No   Sexual activity: Not Currently    Birth  control/protection: Post-menopausal    Comment: 1ST intercourse- 3 , partners- 1,   Other Topics Concern   Not on file  Social History Narrative   Not on file   Social Determinants of Health   Financial Resource Strain: Low Risk  (06/27/2022)   Overall Financial Resource Strain (CARDIA)    Difficulty of Paying Living Expenses: Not hard at all  Food Insecurity: No Food Insecurity (06/27/2022)   Hunger Vital Sign    Worried About Running Out of Food in the Last Year: Never true    [place] in the Last Year: Never true  Transportation Needs: No Transportation Needs (06/27/2022)   [place] - Transportation  Lack of Transportation (Medical): No    Lack of Transportation (Non-Medical): No  Physical Activity: Sufficiently Active (06/27/2022)   Exercise Vital Sign    Days of Exercise per Week: 3 days    Minutes of Exercise per Session: 60 min  Stress: No Stress Concern Present (06/27/2022)   [place]    Feeling of Stress : Not at all  Social Connections: Moderately Integrated (06/27/2022)   Social Connection and Isolation Panel [NHANES]    Frequency of Communication with Friends and Family: More than three times a week    Frequency of Social Gatherings with Friends and Family: More than three times a week    Attends Religious Services: More than 4 times per year    Active Member of [employer] or Organizations: Yes    Attends Music therapist: More than 4 times per year    Marital Status: Never married    Tobacco Counseling Counseling given: Not Answered   Clinical Intake:  Pre-visit preparation completed: Yes  Pain : No/denies pain     Diabetes: Yes CBG done?: No  How often do you need to have someone help you when you read instructions, pamphlets, or other written materials from your doctor or pharmacy?: 1 - Never What is the last grade level you completed in school?: bachelors  degree  Diabetic? Yes  Nutrition Risk Assessment:  Has the patient had any N/V/D within the last 2 months?  No  Does the patient have any non-healing wounds?  No  Has the patient had any unintentional weight loss or weight gain?  No   Diabetes:  Is the patient diabetic?  Yes  If diabetic, was a CBG obtained today?  No  Did the patient bring in their glucometer from home?   na How often do you monitor your CBG's? 3 times week.   Financial Strains and Diabetes Management:  Are you having any financial strains with the device, your supplies or your medication? No .  Does the patient want to be seen by Chronic Care Management for management of their diabetes?   Already established Would the patient like to be referred to a Nutritionist or for Diabetic Management?  No   Diabetic Exams:  Diabetic Eye Exam: Completed 09/18/2021 Diabetic Foot Exam: Completed     Interpreter Needed?: No      Activities of Daily Living    06/27/2022    9:50 AM 06/26/2022    9:14 AM  In your present state of health, do you have any difficulty performing the following activities:  Hearing? 0 0  Vision? 0 0  Difficulty concentrating or making decisions? 0 0  Walking or climbing stairs? 0 0  Dressing or bathing? 0 0  Doing errands, shopping? 0 0  Preparing Food and eating ? N   Using the Toilet? N   In the past six months, have you accidently leaked urine? N   Do you have problems with loss of bowel control? N   Managing your Medications? N   Managing your Finances? N   Housekeeping or managing your Housekeeping? N     Patient Care Team: Glendale Chard, MD as PCP - General (Internal Medicine) Nahser, Wonda Cheng, MD as PCP - Cardiology (Cardiology)  Indicate any recent Medical Services you may have received from other than [facility] providers in the past year (date may be approximate).     Assessment:   This is a routine wellness examination for  Hannah [name].  Hearing/Vision screen No results  found.  Dietary issues and exercise activities discussed:     Goals Addressed             This Visit's Progress    heal       Heal from knee surgery.       Depression Screen    06/27/2022    9:48 AM 06/25/2021   11:18 AM 06/19/2020   11:27 AM 06/19/2020   10:53 AM 06/15/2019   11:26 AM 06/15/2019   10:48 AM 02/23/2019   10:31 AM  PHQ 2/9 Scores  PHQ - 2 Score 0 0 0 0 0 0 0  PHQ- 9 Score     6      Fall Risk    06/27/2022    9:50 AM 06/25/2021   11:18 AM 06/19/2020   11:26 AM 06/19/2020   10:53 AM 06/15/2019   11:26 AM  Fall Risk   Falls in the past year? 0 0 0 0 0  Number falls in past yr: 0      Injury with Fall? 0      Risk for fall due to : No Fall Risks Medication side effect Medication side effect  Medication side effect  Follow up Falls evaluation completed Falls evaluation completed;Education provided;Falls prevention discussed Falls evaluation completed;Education provided;Falls prevention discussed  Falls evaluation completed;Education provided;Falls prevention discussed    FALL RISK PREVENTION PERTAINING TO THE HOME:  Any stairs in or around the home? No  If so, are there any without handrails?  na Home free of loose throw rugs in walkways, pet beds, electrical cords, etc? Yes  Adequate lighting in your home to reduce risk of falls? Yes   ASSISTIVE DEVICES UTILIZED TO PREVENT FALLS:  Life alert? No  Use of a cane, walker or w/c? No  Grab bars in the bathroom? Yes  Shower chair or bench in shower? No  Elevated toilet seat or a handicapped toilet? No  Cognitive Function:        06/27/2022    9:50 AM 06/25/2021   11:20 AM 06/19/2020   11:31 AM 06/15/2019   11:32 AM  6CIT Screen  What Year? 0 points 0 points 0 points 0 points  What month? 0 points 0 points 0 points 0 points  What time? 0 points 0 points 0 points 0 points  Count back from 20 0 points 0 points 0 points 0 points  Months in reverse 0 points 0 points 0 points 2 points  Repeat phrase 0  points 4 points 2 points 0 points  Total Score 0 points 4 points 2 points 2 points    Immunizations Immunization History  Administered Date(s) Administered   19-influenza Whole 07/15/2012   DTaP 06/18/2019   Fluad Quad(high Dose 65+) 06/19/2020, 06/03/2021   Influenza Split 06/28/2014   Influenza, High Dose Seasonal PF 05/18/2019, 06/04/2022   Influenza,inj,Quad PF,6+ Mos 06/20/2018   Moderna SARS-COV2 Booster Vaccination 01/01/2021   Moderna Sars-Covid-2 Vaccination 11/20/2019, 12/19/2019, 08/06/2020, 01/01/2021, 08/13/2021   Pneumococcal Conjugate-13 05/04/2018   Pneumococcal Polysaccharide-23 05/18/2019   Pneumococcal-Unspecified 06/28/2014   Tdap 06/15/2019   Zoster Recombinat (Shingrix) 02/19/2021, 04/24/2021    TDAP status: Up to date  Flu Vaccine status: Due, Education has been provided regarding the importance of this vaccine. Advised may receive this vaccine at local pharmacy or Health Dept. Aware to provide a copy of the vaccination record if obtained from local pharmacy or Health Dept. Verbalized acceptance and understanding.  Pneumococcal vaccine  status: Up to date  Covid-19 vaccine status: Information provided on how to obtain vaccines.   Qualifies for Shingles Vaccine? Yes   Zostavax completed Yes   Shingrix Completed?: Yes  Screening Tests Health Maintenance  Topic Date Due   COVID-19 Vaccine (6 - Moderna risk series) 10/08/2021   HEMOGLOBIN A1C  07/02/2022   OPHTHALMOLOGY EXAM  09/18/2022   FOOT EXAM  01/01/2023   MAMMOGRAM  05/10/2023   Diabetic kidney evaluation - Urine ACR  05/14/2023   Diabetic kidney evaluation - GFR measurement  06/24/2023   TETANUS/TDAP  06/14/2029   Pneumonia Vaccine 10+ Years old  Completed   INFLUENZA VACCINE  Completed   DEXA SCAN  Completed   Hepatitis C Screening  Completed   Zoster Vaccines- Shingrix  Completed   HPV VACCINES  Aged Out   COLONOSCOPY (Pts 45-36yr Insurance coverage will need to be confirmed)   Discontinued    Health Maintenance  Health Maintenance Due  Topic Date Due   COVID-19 Vaccine (6 - Moderna risk series) 10/08/2021    Colorectal cancer screening: Type of screening: Colonoscopy. Completed 2014. Repeat every 10 years  Mammogram status: Completed 05/09/2022. Repeat every year  Bone Density status: Ordered August 11 2022-scheduled. Pt provided with contact info and advised to call to schedule appt.  Lung Cancer Screening: (Low Dose CT Chest recommended if Age 77-80years, 30 pack-year currently smoking OR have quit w/in 15years.) does not qualify.   Lung Cancer Screening Referral: na  Additional Screening:  Hepatitis C Screening: does qualify; Completed yes  Vision Screening: Recommended annual ophthalmology exams for early detection of glaucoma and other disorders of the eye. Is the patient up to date with their annual eye exam?  Yes  Who is the provider or what is the name of the office in which the patient attends annual eye exams? [facility] If pt is not established with a provider, would they like to be referred to a provider to establish care?  established .   Dental Screening: Recommended annual dental exams for proper oral hygiene  Community Resource Referral / Chronic Care Management: CRR required this visit?  No   CCM required this visit?  No      Plan:     I have personally reviewed and noted the following in the patient's chart:   Medical and social history Use of alcohol, tobacco or illicit drugs  Current medications and supplements including opioid prescriptions. Patient is not currently taking opioid prescriptions. Functional ability and status Nutritional status Physical activity Advanced directives List of other physicians Hospitalizations, surgeries, and ER visits in previous 12 months Vitals Screenings to include cognitive, depression, and falls Referrals and appointments  In addition, I have reviewed and discussed with patient  certain preventive protocols, quality metrics, and best practice recommendations. A written personalized care plan for preventive services as well as general preventive health recommendations were provided to patient.     TOctavio Manns  06/27/2022   Nurse Notes:   Hannah [name], Thank you for taking time to come for your Medicare Wellness Visit. I appreciate your ongoing commitment to your health goals. Please review the following plan we discussed and let me know if I can assist you in the future.   These are the goals we discussed:  Goals       heal      Heal from knee surgery.       I hope to acheive better range of motion in my left  knee (pt-stated)      Care Coordination Interventions: Evaluation of current treatment plan related to osteoarthritis of left knee and status post left knee arthroplasty  and patient's adherence to plan as established by provider Provided education to patient re: importance of adherence of following HEP and stretching exercises Reviewed scheduled/upcoming provider appointments including: f/u with Dr. Wynelle Link, orthopedic surgeon to discuss revision of left knee  Advised patient to discuss decreased ROM to left knee with provider Encouraged patient to stay well hydrated and balance her activity with rest Provided FDA COVID 19 vaccine guidelines Advised patient to discuss recommendations for COVID 19 booster vaccine with provider Instructed patient to report new symptoms or concerns to her doctor  Assessed social determinant of health barriers          Patient Stated      06/15/2019, wants to get off some medications      Patient Stated      06/19/2020, wants to lose 25-30 pounds      Patient Stated      06/25/2021, wants to weigh 135-140 pounds      [place] (see longitudinal plan of care for additional care plan information)  Current Barriers:  Chronic Disease Management support, education, and care  coordination needs related to Hypertension, Hyperlipidemia, Diabetes, Asthma, Hypothyroidism, and Osteoporosis   Hypertension BP Readings from Last 3 Encounters:  06/27/20 (!) 144/66  06/19/20 140/72  06/19/20 140/72  Pharmacist Clinical Goal(s): Over the next 90 days, patient will work with PharmD and providers to achieve BP goal <130/80 Current regimen:  Amlodipine 2.'5mg'$  every morning and '5mg'$  every evening Irbesartan '300mg'$  daily Interventions: Reviewed most recent BP readings Reemphasized use of Mrs. Dash and salt limitation Patient self care activities - Over the next 14 days, patient will: Check BP daily, document, and provide at future appointments Ensure daily salt intake < 2300 mg/day Exercise for at least 30 minutes per day 5 days per week (150 minutes per week)  Hyperlipidemia Lab Results  Component Value Date/Time   [facility] 53 10/31/2019 04:35 PM  Pharmacist Clinical Goal(s): Over the next 90 days, patient will work with PharmD and providers to maintain LDL goal < 70 Current regimen:  Atorvastatin 80 mg daily Interventions: Provided dietary and exercise recommendations Taking medication at the same time each day Patient self care activities - Over the next 90 days, patient will: Take cholesterol medication daily as directed Exercise for at least 30 minutes per day 5 days per week (150 minutes per week)  Diabetes Lab Results  Component Value Date/Time   HGBA1C 5.5 06/19/2020 02:29 PM   HGBA1C 5.7 (H) 03/20/2020 04:45 PM  Pharmacist Clinical Goal(s): Over the next 90 days, patient will work with PharmD and providers to maintain A1c goal <7% Current regimen:  No medications currently Interventions: Reviewed most recent A1c Congratulated on current lifestyle modification efforts Patient self care activities - Over the next 90 days, patient will: Check blood sugar several times per week, document, and provide at future appointments Contact provider with any  episodes of hypoglycemia Exercise for at least 30 minutes per day 5 days per week (150 minutes per week)  Hypothyroidism Pharmacist Clinical Goal(s) Over the next 90 days, patient will work with PharmD and providers to obtain Synthroid medication Current regimen:  Synthroid 12mg daily Monday through Saturday, skip Sundays Interventions: Referred to CPA for help [facility] activities -  Over the next 90 days, patient will: Continue taking Synthroid daily as directed Follow up with Felicity Coyer with any questions regarding renewal  Medication management Pharmacist Clinical Goal(s): Over the next 90 days, patient will work with PharmD and providers to achieve optimal medication adherence Current pharmacy: [facility] Interventions Comprehensive medication review performed. Continue current medication management strategy Patient self care activities - Over the next 90 days, patient will: Focus on medication adherence by considering use of pill box to aide in organization of medication Take medications as prescribed Report any questions or concerns to PharmD and/or provider(s)  Please see past updates related to this goal by clicking on the "Past Updates" button in the selected goal        Track and Manage My Blood Pressure-Hypertension      Timeframe:  Long-Range Goal Priority:  Medium Start Date:  01/13/21                          Expected End Date: 12/2022             Follow Up Date: 09/02/2022   In progress:  Self-Care Activities: Self administers medications as prescribed Attends all scheduled provider appointments Calls provider office for new concerns, questions, or BP outside discussed parameters Checks BP and records as discussed Follows a low sodium diet/DASH diet Patient Goals: - check blood pressure 5 times per week - choose a place to take my blood pressure (home, clinic or office, retail store) - write blood pressure  results in a log or diary - learn about high blood pressure   Why is this important?   You won't feel high blood pressure, but it can still hurt your blood vessels.  Making lifestyle changes like losing a little weight or eating less salt will help.  Checking your blood pressure at home and at different times of the day can help to control blood pressure.  If the doctor prescribes medicine remember to take it the way the doctor ordered.  Call the office if you cannot afford the medicine or if there are questions about it.             This is a list of the screening recommended for you and due dates:  Health Maintenance  Topic Date Due   COVID-19 Vaccine (6 - Moderna risk series) 10/08/2021   Hemoglobin A1C  07/02/2022   Eye exam for diabetics  09/18/2022   Complete foot exam   01/01/2023   Mammogram  05/10/2023   Yearly kidney health urinalysis for diabetes  05/14/2023   Yearly kidney function blood test for diabetes  06/24/2023   Tetanus Vaccine  06/14/2029   Pneumonia Vaccine  Completed   Flu Shot  Completed   DEXA scan (bone density measurement)  Completed   Hepatitis C Screening: USPSTF Recommendation to screen - Ages 8-79 yo.  Completed   Zoster (Shingles) Vaccine  Completed   HPV Vaccine  Aged Out   Colon Cancer Screening  Discontinued

## 2022-06-29 LAB — MULTIPLE MYELOMA PANEL, SERUM
Albumin SerPl Elph-Mcnc: 3.2 g/dL (ref 2.9–4.4)
Albumin/Glob SerPl: 1.4 (ref 0.7–1.7)
Alpha 1: 0.2 g/dL (ref 0.0–0.4)
Alpha2 Glob SerPl Elph-Mcnc: 0.6 g/dL (ref 0.4–1.0)
B-Globulin SerPl Elph-Mcnc: 0.9 g/dL (ref 0.7–1.3)
Gamma Glob SerPl Elph-Mcnc: 0.7 g/dL (ref 0.4–1.8)
Globulin, Total: 2.4 g/dL (ref 2.2–3.9)
IgA: 137 mg/dL (ref 64–422)
IgG (Immunoglobin G), Serum: 858 mg/dL (ref 586–1602)
IgM (Immunoglobulin M), Srm: 30 mg/dL (ref 26–217)
Total Protein ELP: 5.6 g/dL — ABNORMAL LOW (ref 6.0–8.5)

## 2022-06-30 DIAGNOSIS — M25562 Pain in left knee: Secondary | ICD-10-CM | POA: Diagnosis not present

## 2022-06-30 DIAGNOSIS — R2689 Other abnormalities of gait and mobility: Secondary | ICD-10-CM | POA: Diagnosis not present

## 2022-06-30 DIAGNOSIS — M6281 Muscle weakness (generalized): Secondary | ICD-10-CM | POA: Diagnosis not present

## 2022-07-01 NOTE — H&P (Signed)
H&P  Subjective:  HPI: Hannah [name], 76 y.o. female is s/p left total knee arthroplasty from approximately four months ago. She had an uncomplicated surgery and hospital course. She worked with outpatient physical therapy immediately following surgery. Unfortunately, she plateaued in her progression with active flexion. Continued to work with therapy, but was unable to push past 95 degrees. She was examined in our office on 05/28/22, where she was found to have a firm endpoint right at the 95 degree mark. Options were discussed with the patient, it was felt that a closed manipulation would give her the best chance at successfully progressing her motion. She presents today for procedure.   Patient Active Problem List   Diagnosis Date Noted   OA (osteoarthritis) of knee 02/02/2022   Primary osteoarthritis of left knee 02/02/2022   Chronic bilateral low back pain without sciatica 06/25/2021   Paresthesia and pain of extremity 06/25/2021   Atrial fibrillation ([place]) 04/16/2021   Demand ischemia 03/26/2021   Unspecified atrial fibrillation ([place]) 03/26/2021   COVID-19 virus infection 03/26/2021   PVC (premature ventricular contraction) 05/06/2020   Type 2 diabetes mellitus with stage 3 chronic kidney disease, without long-term current use of insulin ([place]) 03/20/2020   Palpitations 03/20/2020   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/2020   Primary hypothyroidism 03/20/2020   Vitiligo 03/20/2020   Iron deficiency anemia 08/12/2017   Osteoporosis 10/02/2016   Systemic lupus erythematosus ([place]) 10/02/2016   Rheumatoid arthritis involving multiple sites with positive rheumatoid factor ([place]) 10/02/2016   High risk medication use 10/02/2016   History of chronic kidney disease 10/02/2016   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/2016   Primary osteoarthritis of both knees 10/02/2016   Vitamin D deficiency 10/02/2016   History of diabetes mellitus 10/02/2016   History of  hypertension 10/02/2016   History of asthma 10/02/2016   Absolute anemia    MGUS (monoclonal gammopathy of unknown significance)    Normocytic anemia 03/21/2015   Essential hypertension 03/20/2015   Abnormal CT of the chest 07/30/2012   Asthma 06/19/2012    Past Medical History:  Diagnosis Date   Anemia    Arthritis    Asthma    Atrial fibrillation ([place])    Chronic kidney disease    Diabetes mellitus without complication ([place])    Gout 12/17/2014   patient reported   Hyperlipidemia    Hypertension    Hypothyroidism    Systemic lupus erythematosus ([place])    Vitiligo     Past Surgical History:  Procedure Laterality Date   CATARACT EXTRACTION Bilateral 2015   DILATION AND CURETTAGE OF UTERUS     DOPPLER ECHOCARDIOGRAPHY  05/2018   Internist to review with pt; potential heart murmur 06/20/18   keratosis removal  2021   SKIN SURGERY  11/30/2018   left side of face   TOOTH EXTRACTION     TOTAL KNEE ARTHROPLASTY Left 02/02/2022   Procedure: TOTAL KNEE ARTHROPLASTY;  Surgeon: Gaynelle Arabian, MD;  Location: [facility];  Service: Orthopedics;  Laterality: Left;    Prior to Admission medications   Medication Sig Start Date End Date Taking? Authorizing Provider  allopurinol (ZYLOPRIM) 100 MG tablet TAKE 1 TABLET BY MOUTH EVERY DAY FOR GOUT 01/08/22  Yes Glendale Chard, MD  amLODipine (NORVASC) 2.5 MG tablet TAKE 1 TABLET(2.5 MG) BY MOUTH DAILY 11/20/21  Yes Glendale Chard, MD  apixaban (ELIQUIS) 5 MG TABS tablet Take 1 tablet (5 mg total) by mouth 2 (two) times daily. 12/18/21  Yes Nahser, Arnette Norris  J, MD  Ascorbic Acid (VITAMIN C) 1000 MG tablet Take 1,000 mg by mouth daily.   Yes [provider]  atorvastatin (LIPITOR) 80 MG tablet TAKE 1 TABLET BY MOUTH EVERY DAY 04/13/22  Yes Glendale Chard, MD  budesonide-formoterol (SYMBICORT) 160-4.5 MCG/ACT inhaler INHALE 2 PUFFS BY MOUTH TWICE DAILY IN THE MORNING AND IN THE EVENING Patient taking differently: Inhale 2 puffs into the lungs  daily as needed (shortness of breath or wheezing). 04/09/22  Yes Glendale Chard, MD  Calcium Carb-Cholecalciferol 600-800 MG-UNIT TABS Take 1 tablet by mouth daily.    Yes [provider]  Cholecalciferol (VITAMIN D PO) Take 2,000 Units by mouth daily.   Yes [provider]  cyanocobalamin 1000 MCG tablet Take 1,000 mcg by mouth daily.   Yes [provider]  denosumab (PROLIA) 60 MG/ML SOSY injection Inject 60 mg into the skin every 6 (six) months. Courier to rheum: [address], [place], [place] [address]. Appt on 06/09/22 05/15/22 05/15/23 Yes Ofilia Neas, PA-C  hydroxychloroquine (PLAQUENIL) 200 MG tablet TAKE 1 TABLET(200 MG) BY MOUTH DAILY 06/15/22  Yes Ofilia Neas, PA-C  Hypromellose (ARTIFICIAL TEARS OP) Apply 1 drop to eye daily as needed (dry eyes).   Yes [provider]  irbesartan (AVAPRO) 300 MG tablet TAKE 1 TABLET(300 MG) BY MOUTH DAILY 04/03/22  Yes Glendale Chard, MD  iron polysaccharides (NIFEREX) 150 MG capsule Take 150 mg by mouth daily.   Yes [provider]  metoprolol succinate (TOPROL-XL) 25 MG 24 hr tablet Take 1 tablet (25 mg total) by mouth daily. 04/17/22  Yes Nahser, Wonda Cheng, MD  SYNTHROID 88 MCG tablet TAKE 1 TABLET BY MOUTH EVERY DAY MONDAY TO SATURDAY AND OFF ON SUNDAYS 05/19/21  Yes Glendale Chard, MD  Accu-Chek Softclix Lancets lancets Use to check blood sugars daily E11.69 11/04/20   Glendale Chard, MD  albuterol (PROVENTIL HFA;VENTOLIN HFA) 108 (90 Base) MCG/ACT inhaler Inhale 2 puffs into the lungs every 6 (six) hours as needed for wheezing or shortness of breath. 09/02/18   Minette Brine, FNP  Alcohol Swabs (ALCOHOL PADS) 70 % PADS Use as directed to check blood sugars 1 time per day dx: e11.22 06/28/20   Glendale Chard, MD  glucose blood (ACCU-CHEK GUIDE) test strip Use to check blood sugars daily E11.69 11/04/20   Glendale Chard, MD  methocarbamol (ROBAXIN) 500 MG tablet Take 1 tablet (500 mg total) by mouth every 6  (six) hours as needed for muscle spasms. 02/03/22   Maggi Hershkowitz L, PA  oxyCODONE (OXY IR/ROXICODONE) 5 MG immediate release tablet Take 1-2 tablets (5-10 mg total) by mouth every 6 (six) hours as needed for severe pain. 02/03/22   Conna Terada, Ok Anis, PA  traMADol (ULTRAM) 50 MG tablet Take 1-2 tablets (50-100 mg total) by mouth every 6 (six) hours as needed for moderate pain. 02/03/22   Charlis Harner L, PA  valsartan-hydrochlorothiazide (DIOVAN-HCT) 160-25 MG tablet Take 1 tablet by mouth daily. 09/18/21   [provider]    Allergies  Allergen Reactions   Shellfish Allergy Anaphylaxis and Hives    Social History   Socioeconomic History   Marital status: Single    Spouse name: Not on file   Number of children: 0   Years of education: Not on file   Highest education level: Not on file  Occupational History   Occupation: retired  Tobacco Use   Smoking status: Never    Passive exposure: Never   Smokeless tobacco: Never  Vaping Use   Vaping Use: Never used  Substance and Sexual Activity   Alcohol use: Not Currently   Drug use: No   Sexual activity: Not Currently    Birth control/protection: Post-menopausal    Comment: 1ST intercourse- 55 , partners- 12,   Other Topics Concern   Not on file  Social History Narrative   Not on file   Social Determinants of Health   Financial Resource Strain: Low Risk  (06/27/2022)   Overall Financial Resource Strain (CARDIA)    Difficulty of Paying Living Expenses: Not hard at all  Food Insecurity: No Food Insecurity (06/27/2022)   Hunger Vital Sign    Worried About Running Out of Food in the Last Year: Never true    Ran Out of Food in the Last Year: Never true  Transportation Needs: No Transportation Needs (06/27/2022)   PRAPARE - Hydrologist (Medical): No    Lack of Transportation (Non-Medical): No  Physical Activity: Sufficiently Active (06/27/2022)   Exercise Vital Sign    Days of Exercise per  Week: 3 days    Minutes of Exercise per Session: 60 min  Stress: No Stress Concern Present (06/27/2022)   [place]    Feeling of Stress : Not at all  Social Connections: Moderately Integrated (06/27/2022)   Social Connection and Isolation Panel [NHANES]    Frequency of Communication with Friends and Family: More than three times a week    Frequency of Social Gatherings with Friends and Family: More than three times a week    Attends Religious Services: More than 4 times per year    Active Member of [employer] or Organizations: Yes    Attends Music therapist: More than 4 times per year    Marital Status: Never married  Intimate Partner Violence: Not At Risk (06/27/2022)   Humiliation, Afraid, Rape, and Kick questionnaire    Fear of Current or Ex-Partner: No    Emotionally Abused: No    Physically Abused: No    Sexually Abused: No    Tobacco Use: Low Risk  (06/27/2022)   Patient History    Smoking Tobacco Use: Never    Smokeless Tobacco Use: Never    Passive Exposure: Never   Social History   Substance and Sexual Activity  Alcohol Use Not Currently    Family History  Problem Relation Age of Onset   Heart disease Father    Diabetes Father    Hypertension Mother    Hypertension Sister    Hypertension Sister    Leukemia Sister     Review of Systems  Constitutional:  Negative for chills and fever.  HENT:  Negative for congestion, sore throat and tinnitus.   Eyes:  Negative for double vision, photophobia and pain.  Respiratory:  Negative for cough, shortness of breath and wheezing.   Cardiovascular:  Negative for chest pain, palpitations and orthopnea.  Gastrointestinal:  Negative for heartburn, nausea and vomiting.  Genitourinary:  Negative for dysuria, frequency and urgency.  Musculoskeletal:  Positive for joint pain.  Neurological:  Negative for dizziness, weakness and headaches.     Objective:  Physical Exam: The patient is a pleasant, well-developed female alert and oriented in no apparent distress.  Left knee exam: Well-healed scar. There is no swelling. The range of motion: 0 to 95 degrees with a very firm endpoint at 95 degrees. The knee is stable.  The patient's sensation and motor function  are intact in their lower extremities. Their distal pulses are 2+. The bilateral calves are soft and nontender.  Imaging Review  - AP and lateral of the bilateral knees dated 05/28/2022 demonstrate the prosthesis on the left is in excellent position with no periprosthetic abnormalities  Assessment/Plan:  Arthrofibrosis, left knee  We discussed the patient's presenting complaints, history, and treatment options. She has arthrofibrosis. She would benefit from a closed manipulation. She has a very firm endpoint at 95 degrees of flexion. She is at a point where she wants to go ahead and proceed with that. We did discuss that in detail. We will set her up for closed manipulation. She is to wait until after 06/17/2022 because her sister will be out of the country until then and the sister will be helping postoperatively.  Theresa Duty, PA-C Orthopedic Surgery [facility]

## 2022-07-03 NOTE — Progress Notes (Signed)
Pt called to clarify in regards to Eliquis. Pt stated cardiology did not instruct her to stop taking and did she need to take am of surgery. Nurse instructed to hold on morning of surgery till discussed with anesthesia and surgeon. Pt in agreement.

## 2022-07-06 ENCOUNTER — Ambulatory Visit (HOSPITAL_COMMUNITY)
Admission: RE | Admit: 2022-07-06 | Discharge: 2022-07-06 | Disposition: A | Discharge status: Home / Self Care | Payer: Medicare HMO | Source: Ambulatory Visit | Attending: Orthopedic Surgery | Admitting: Orthopedic Surgery

## 2022-07-06 ENCOUNTER — Ambulatory Visit (HOSPITAL_COMMUNITY): Payer: Medicare HMO | Admitting: Physician Assistant

## 2022-07-06 ENCOUNTER — Other Ambulatory Visit: Payer: Medicare HMO

## 2022-07-06 ENCOUNTER — Ambulatory Visit (HOSPITAL_BASED_OUTPATIENT_CLINIC_OR_DEPARTMENT_OTHER): Payer: Medicare HMO | Admitting: Physician Assistant

## 2022-07-06 ENCOUNTER — Encounter (HOSPITAL_COMMUNITY): Payer: Self-pay | Admitting: Orthopedic Surgery

## 2022-07-06 ENCOUNTER — Encounter (HOSPITAL_COMMUNITY)
Admission: RE | Disposition: A | Discharge status: Home / Self Care | Payer: Self-pay | Source: Ambulatory Visit | Attending: Orthopedic Surgery

## 2022-07-06 DIAGNOSIS — D638 Anemia in other chronic diseases classified elsewhere: Secondary | ICD-10-CM | POA: Diagnosis not present

## 2022-07-06 DIAGNOSIS — T8482XA Fibrosis due to internal orthopedic prosthetic devices, implants and grafts, initial encounter: Secondary | ICD-10-CM | POA: Diagnosis present

## 2022-07-06 DIAGNOSIS — D631 Anemia in chronic kidney disease: Secondary | ICD-10-CM | POA: Diagnosis not present

## 2022-07-06 DIAGNOSIS — Z01818 Encounter for other preprocedural examination: Secondary | ICD-10-CM

## 2022-07-06 DIAGNOSIS — I4891 Unspecified atrial fibrillation: Secondary | ICD-10-CM | POA: Insufficient documentation

## 2022-07-06 DIAGNOSIS — M329 Systemic lupus erythematosus, unspecified: Secondary | ICD-10-CM | POA: Insufficient documentation

## 2022-07-06 DIAGNOSIS — E039 Hypothyroidism, unspecified: Secondary | ICD-10-CM | POA: Diagnosis not present

## 2022-07-06 DIAGNOSIS — J45909 Unspecified asthma, uncomplicated: Secondary | ICD-10-CM | POA: Insufficient documentation

## 2022-07-06 DIAGNOSIS — Z79899 Other long term (current) drug therapy: Secondary | ICD-10-CM | POA: Diagnosis not present

## 2022-07-06 DIAGNOSIS — I10 Essential (primary) hypertension: Secondary | ICD-10-CM

## 2022-07-06 DIAGNOSIS — M24662 Ankylosis, left knee: Secondary | ICD-10-CM | POA: Diagnosis not present

## 2022-07-06 DIAGNOSIS — E119 Type 2 diabetes mellitus without complications: Secondary | ICD-10-CM

## 2022-07-06 DIAGNOSIS — I129 Hypertensive chronic kidney disease with stage 1 through stage 4 chronic kidney disease, or unspecified chronic kidney disease: Secondary | ICD-10-CM | POA: Insufficient documentation

## 2022-07-06 DIAGNOSIS — Z6832 Body mass index (BMI) 32.0-32.9, adult: Secondary | ICD-10-CM | POA: Insufficient documentation

## 2022-07-06 DIAGNOSIS — Z7901 Long term (current) use of anticoagulants: Secondary | ICD-10-CM | POA: Insufficient documentation

## 2022-07-06 DIAGNOSIS — E1122 Type 2 diabetes mellitus with diabetic chronic kidney disease: Secondary | ICD-10-CM | POA: Diagnosis not present

## 2022-07-06 DIAGNOSIS — Z96652 Presence of left artificial knee joint: Secondary | ICD-10-CM | POA: Diagnosis not present

## 2022-07-06 DIAGNOSIS — E669 Obesity, unspecified: Secondary | ICD-10-CM | POA: Diagnosis not present

## 2022-07-06 DIAGNOSIS — N183 Chronic kidney disease, stage 3 unspecified: Secondary | ICD-10-CM | POA: Insufficient documentation

## 2022-07-06 DIAGNOSIS — M24661 Ankylosis, right knee: Secondary | ICD-10-CM | POA: Diagnosis present

## 2022-07-06 DIAGNOSIS — M1711 Unilateral primary osteoarthritis, right knee: Secondary | ICD-10-CM | POA: Diagnosis present

## 2022-07-06 HISTORY — PX: KNEE CLOSED REDUCTION: SHX995

## 2022-07-06 LAB — GLUCOSE, CAPILLARY: Glucose-Capillary: 88 mg/dL (ref 70–99)

## 2022-07-06 SURGERY — MANIPULATION, KNEE, CLOSED
Anesthesia: General | Site: Knee | Laterality: Left

## 2022-07-06 MED ORDER — TRAMADOL HCL 50 MG PO TABS: 50.0000 mg | ORAL_TABLET | Freq: Four times a day (QID) | ORAL | 0 refills | Status: DC | PRN

## 2022-07-06 MED ORDER — OXYCODONE HCL 5 MG PO TABS: 5.0000 mg | ORAL_TABLET | Freq: Four times a day (QID) | ORAL | 0 refills | Status: DC | PRN

## 2022-07-06 MED ORDER — LACTATED RINGERS IV SOLN: INTRAVENOUS | Status: DC

## 2022-07-06 MED ORDER — CHLORHEXIDINE GLUCONATE 4 % EX LIQD: 60.0000 mL | Freq: Once | CUTANEOUS | Status: DC

## 2022-07-06 MED ORDER — CHLORHEXIDINE GLUCONATE 0.12 % MT SOLN
15.0000 mL | Freq: Once | OROMUCOSAL | Status: AC
  Administered 2022-07-06: 15 mL via OROMUCOSAL

## 2022-07-06 MED ORDER — PROPOFOL 10 MG/ML IV BOLUS
INTRAVENOUS | Status: DC | PRN
  Administered 2022-07-06: 50 mg via INTRAVENOUS
  Administered 2022-07-06: 100 mg via INTRAVENOUS
  Administered 2022-07-06: 50 mg via INTRAVENOUS

## 2022-07-06 MED ORDER — ONDANSETRON HCL 4 MG/2ML IJ SOLN: 4.0000 mg | Freq: Once | INTRAMUSCULAR | Status: DC | PRN

## 2022-07-06 MED ORDER — PROPOFOL 10 MG/ML IV BOLUS
INTRAVENOUS | Status: AC
  Filled 2022-07-06: qty 20

## 2022-07-06 MED ORDER — FENTANYL CITRATE PF 50 MCG/ML IJ SOSY: 25.0000 ug | PREFILLED_SYRINGE | INTRAMUSCULAR | Status: DC | PRN

## 2022-07-06 MED ORDER — POVIDONE-IODINE 10 % EX SWAB: 2.0000 | Freq: Once | CUTANEOUS | Status: DC

## 2022-07-06 MED ORDER — ORAL CARE MOUTH RINSE: 15.0000 mL | Freq: Once | OROMUCOSAL | Status: AC

## 2022-07-06 MED ORDER — METHOCARBAMOL 500 MG PO TABS: 500.0000 mg | ORAL_TABLET | Freq: Four times a day (QID) | ORAL | 0 refills | Status: DC | PRN

## 2022-07-06 MED ORDER — METHYLPREDNISOLONE ACETATE 40 MG/ML IJ SUSP
INTRAMUSCULAR | Status: AC
  Filled 2022-07-06: qty 1

## 2022-07-06 MED ORDER — LIDOCAINE HCL (PF) 1 % IJ SOLN
INTRAMUSCULAR | Status: AC
  Filled 2022-07-06: qty 30

## 2022-07-06 MED ORDER — FENTANYL CITRATE (PF) 100 MCG/2ML IJ SOLN
INTRAMUSCULAR | Status: DC | PRN
  Administered 2022-07-06: 50 ug via INTRAVENOUS

## 2022-07-06 MED ORDER — ACETAMINOPHEN 10 MG/ML IV SOLN
1000.0000 mg | Freq: Four times a day (QID) | INTRAVENOUS | Status: DC
  Administered 2022-07-06: 1000 mg via INTRAVENOUS
  Filled 2022-07-06: qty 100

## 2022-07-06 MED ORDER — FENTANYL CITRATE (PF) 100 MCG/2ML IJ SOLN
INTRAMUSCULAR | Status: AC
  Filled 2022-07-06: qty 2

## 2022-07-06 SURGICAL SUPPLY — 16 items
BAG COUNTER SPONGE SURGICOUNT (BAG) IMPLANT
BAG SPNG CNTER NS LX DISP (BAG)
BNDG ADH 1X3 SHEER STRL LF (GAUZE/BANDAGES/DRESSINGS) IMPLANT
BNDG ADH THN 3X1 STRL LF (GAUZE/BANDAGES/DRESSINGS)
COVER SURGICAL LIGHT HANDLE (MISCELLANEOUS) ×1 IMPLANT
GAUZE SPONGE 4X4 12PLY STRL (GAUZE/BANDAGES/DRESSINGS) IMPLANT
GLOVE BIO SURGEON STRL SZ 6.5 (GLOVE) ×2 IMPLANT
GLOVE BIO SURGEON STRL SZ8 (GLOVE) ×2 IMPLANT
GLOVE BIOGEL PI IND STRL 7.0 (GLOVE) ×2 IMPLANT
GLOVE BIOGEL PI IND STRL 8 (GLOVE) ×1 IMPLANT
GLOVE SURG SS PI 7.0 STRL IVOR (GLOVE) ×1 IMPLANT
GOWN STRL REUS W/ TWL LRG LVL3 (GOWN DISPOSABLE) ×1 IMPLANT
GOWN STRL REUS W/TWL LRG LVL3 (GOWN DISPOSABLE)
NDL SAFETY ECLIP 18X1.5 (MISCELLANEOUS) IMPLANT
SWABSTICK PVP IODINE (MISCELLANEOUS) ×1 IMPLANT
SYR CONTROL 10ML LL (SYRINGE) IMPLANT

## 2022-07-06 NOTE — Op Note (Signed)
  OPERATIVE REPORT   PREOPERATIVE DIAGNOSIS: Arthrofibrosis, Left  knee.   POSTOPERATIVE DIAGNOSIS: Arthrofibrosis, Left knee.   PROCEDURE:  Left  knee closed manipulation.   SURGEON: Gaynelle Arabian, MD   ASSISTANT: None.   ANESTHESIA: General.   COMPLICATIONS: None.   CONDITION: Stable to Recovery.   Pre-manipulation range of motion is 5-90.  Post-manipulation range of  Motion is 0-125  PROCEDURE IN DETAIL: After successful administration of general  anesthetic, exam under anesthesia was performed showing range of motion  5-90 degrees. I then placed my chest against the proximal tibia,  flexing the knee with audible lysis of adhesions. I was easily able to  get the knee flexed to 125  degrees. I then put the knee back in extension and with some  patellar manipulation and gentle pressure got to full Extension.The patient was subsequently awakened and transported to Recovery in  stable condition.

## 2022-07-06 NOTE — Anesthesia Postprocedure Evaluation (Signed)
Anesthesia Post Note  Patient: Hannah [name]  Procedure(s) Performed: CLOSED MANIPULATION KNEE (Left: Knee)     Patient location during evaluation: PACU Anesthesia Type: General Level of consciousness: awake and alert Pain management: pain level controlled Vital Signs Assessment: post-procedure vital signs reviewed and stable Respiratory status: spontaneous breathing, nonlabored ventilation, respiratory function stable and patient connected to nasal cannula oxygen Cardiovascular status: blood pressure returned to baseline and stable Postop Assessment: no apparent nausea or vomiting Anesthetic complications: no   No notable events documented.  Last Vitals:  Vitals:   07/06/22 1630 07/06/22 1645  BP: (!) 157/69 (!) 156/76  Pulse: (!) 59 68  Resp: 14 15  Temp:    SpO2: 98% 100%    Last Pain:  Vitals:   07/06/22 1615  TempSrc:   PainSc: 0-No pain                 Santa Lighter

## 2022-07-06 NOTE — Discharge Instructions (Addendum)
Hannah Arabian, MD Total Joint Specialist [facility] [address] [place], [place] [address] [phone]  KNEE POSTOPERATIVE DIRECTIONS  Knee Rehabilitation, Guidelines Following Surgery  Results after knee surgery are often greatly improved when you follow the exercise, range of motion and muscle strengthening exercises prescribed by your doctor. Safety measures are also important to protect the knee from further injury. If any of these exercises cause you to have increased pain or swelling in your knee joint, decrease the amount until you are comfortable again and slowly increase them. If you have problems or questions, call your caregiver or physical therapist for advice.   HOME CARE INSTRUCTIONS  Remove items at home which could result in a fall. This includes throw rugs or furniture in walking pathways.  ICE to the affected knee as much as tolerated. Icing helps control swelling. If the swelling is well controlled you will be more comfortable and rehab easier. Continue to use ice on the knee for pain and swelling from surgery. You may notice swelling that will progress down to the foot and ankle. This is normal after surgery. Elevate the leg when you are not up walking on it.    Continue to use the breathing machine which will help keep your temperature down. It is common for your temperature to cycle up and down following surgery, especially at night when you are not up moving around and exerting yourself. The breathing machine keeps your lungs expanded and your temperature down. Do not place pillow under the operative knee, focus on keeping the knee straight while resting  DIET You may resume your previous home diet once you are discharged from the hospital.  TED HOSE STOCKINGS Wear the elastic stockings on both legs for three weeks following surgery during the day. You may remove them at night for sleeping.  WEIGHT BEARING Weight bearing as tolerated  with assist device (walker, cane, etc) as directed, use it as long as suggested by your surgeon or therapist, typically at least 4-6 weeks.  POSTOPERATIVE CONSTIPATION PROTOCOL Constipation - defined medically as fewer than three stools per week and severe constipation as less than one stool per week.  One of the most common issues patients have following surgery is constipation.  Even if you have a regular bowel pattern at home, your normal regimen is likely to be disrupted due to multiple reasons following surgery.  Combination of anesthesia, postoperative narcotics, change in appetite and fluid intake all can affect your bowels.  In order to avoid complications following surgery, here are some recommendations in order to help you during your recovery period.  Colace (docusate) - Pick up an over-the-counter form of Colace or another stool softener and take twice a day as long as you are requiring postoperative pain medications.  Take with a full glass of water daily.  If you experience loose stools or diarrhea, hold the colace until you stool forms back up. If your symptoms do not get better within 1 week or if they get worse, check with your doctor. Dulcolax (bisacodyl) - Pick up over-the-counter and take as directed by the product packaging as needed to assist with the movement of your bowels.  Take with a full glass of water.  Use this product as needed if not relieved by Colace only.  MiraLax (polyethylene glycol) - Pick up over-the-counter to have on hand. MiraLax is a solution that will increase the amount of water in your bowels to assist with bowel movements.  Take as directed and can mix with a glass of water, juice, soda, coffee, or tea. Take if you go more than two days without a movement. Do not use MiraLax more than once per day. Call your doctor if you are still constipated or irregular after using this medication for 7 days in a row.  If you continue to have problems with postoperative  constipation, please contact the office for further assistance and recommendations.  If you experience "the worst abdominal pain ever" or develop nausea or vomiting, please contact the office immediatly for further recommendations for treatment.  ITCHING If you experience itching with your medications, try taking only a single pain pill, or even half a pain pill at a time.  You can also use Benadryl over the counter for itching or also to help with sleep.   MEDICATIONS See your medication summary on the "After Visit Summary" that the nursing staff will review with you prior to discharge.  You may have some home medications which will be placed on hold until you complete the course of blood thinner medication.  It is important for you to complete the blood thinner medication as prescribed by your surgeon.  Continue your approved medications as instructed at time of discharge.  PRECAUTIONS If you experience chest pain or shortness of breath - call 911 immediately for transfer to the hospital emergency department.  If you develop a fever greater that 101 F, purulent drainage from wound, increased redness or drainage from wound, foul odor from the wound/dressing, or calf pain - CONTACT YOUR SURGEON.                                                   FOLLOW-UP APPOINTMENTS Make sure you keep all of your appointments after your operation with your surgeon and caregivers. You should call the office at the above phone number and make an appointment for approximately two weeks after the date of your surgery or on the date instructed by your surgeon outlined in the "After Visit Summary".  RANGE OF MOTION AND STRENGTHENING EXERCISES  Rehabilitation of the knee is important following a knee injury or an operation. After just a few days of immobilization, the muscles of the thigh which control the knee become weakened and shrink (atrophy). Knee exercises are designed to build up the tone and strength of the thigh  muscles and to improve knee motion. Often times heat used for twenty to thirty minutes before working out will loosen up your tissues and help with improving the range of motion but do not use heat for the first two weeks following surgery. These exercises can be done on a training (exercise) mat, on the floor, on a table or on a bed. Use what ever works the best and is most comfortable for you Knee exercises include:  Leg Lifts - While your knee is still immobilized in a splint or cast, you can do straight leg raises. Lift the leg to 60 degrees, hold for 3 sec, and slowly lower the leg. Repeat 10-20 times 2-3 times daily. Perform this exercise against resistance later as your knee gets better.  Quad and Hamstring Sets - Tighten up the muscle on the front of the thigh (Quad) and hold for 5-10 sec. Repeat this 10-20 times hourly. Hamstring sets are done by pushing the foot backward against  an object and holding for 5-10 sec. Repeat as with quad sets.  Leg Slides: Lying on your back, slowly slide your foot toward your buttocks, bending your knee up off the floor (only go as far as is comfortable). Then slowly slide your foot back down until your leg is flat on the floor again. Angel Wings: Lying on your back spread your legs to the side as far apart as you can without causing discomfort.  A rehabilitation program following serious knee injuries can speed recovery and prevent re-injury in the future due to weakened muscles. Contact your doctor or a physical therapist for more information on knee rehabilitation.   POST-OPERATIVE OPIOID TAPER INSTRUCTIONS: It is important to wean off of your opioid medication as soon as possible. If you do not need pain medication after your surgery it is ok to stop day one. Opioids include: Codeine, Hydrocodone(Norco, Vicodin), Oxycodone(Percocet, oxycontin) and hydromorphone amongst others.  Long term and even short term use of opiods can cause: Increased pain  response Dependence Constipation Depression Respiratory depression And more.  Withdrawal symptoms can include Flu like symptoms Nausea, vomiting And more Techniques to manage these symptoms Hydrate well Eat regular healthy meals Stay active Use relaxation techniques(deep breathing, meditating, yoga) Do Not substitute Alcohol to help with tapering If you have been on opioids for less than two weeks and do not have pain than it is ok to stop all together.  Plan to wean off of opioids This plan should start within one week post op of your joint replacement. Maintain the same interval or time between taking each dose and first decrease the dose.  Cut the total daily intake of opioids by one tablet each day Next start to increase the time between doses. The last dose that should be eliminated is the evening dose.   MAKE SURE YOU:  Understand these instructions.  Get help right away if you are not doing well or get worse.   DENTAL ANTIBIOTICS:  In most cases prophylactic antibiotics for Dental procdeures after total joint surgery are not necessary.  Exceptions are as follows:  1. History of prior total joint infection  2. Severely immunocompromised (Organ Transplant, cancer chemotherapy, Rheumatoid biologic medications such as [place])  3. Poorly controlled diabetes (A1C &gt; 8.0, blood glucose over 200)  If you have one of these conditions, contact your surgeon for an antibiotic prescription, prior to your dental procedure.    Pick up stool softner and laxative for home use following surgery while on pain medications. Do not submerge incision under water. Please use good hand washing techniques while changing dressing each day. May shower starting three days after surgery. Please use a clean towel to pat the incision dry following showers. Continue to use ice for pain and swelling after surgery. Do not use any lotions or creams on the incision until instructed by your  surgeon.

## 2022-07-06 NOTE — Interval H&P Note (Signed)
History and Physical Interval Note:  07/06/2022 2:00 PM  Hannah [name]  has presented today for surgery, with the diagnosis of left knee arthrofibrosis.  The various methods of treatment have been discussed with the patient and family. After consideration of risks, benefits and other options for treatment, the patient has consented to  Procedure(s): CLOSED MANIPULATION KNEE (Left) as a surgical intervention.  The patient's history has been reviewed, patient examined, no change in status, stable for surgery.  I have reviewed the patient's chart and labs.  Questions were answered to the patient's satisfaction.     Pilar Plate Rashaun Wichert

## 2022-07-06 NOTE — Anesthesia Preprocedure Evaluation (Addendum)
Anesthesia Evaluation  Patient identified by MRN, date of birth, ID band Patient awake    Reviewed: Allergy & Precautions, NPO status , Patient's Chart, lab work & pertinent test results, reviewed documented beta blocker date and time   Airway Mallampati: II  TM Distance: >3 FB Neck ROM: Full    Dental  (+) Teeth Intact, Dental Advisory Given   Pulmonary asthma ,    Pulmonary exam normal breath sounds clear to auscultation       Cardiovascular hypertension, Pt. on medications and Pt. on home beta blockers + dysrhythmias Atrial Fibrillation  Rhythm:Regular Rate:Normal + Systolic murmurs    Neuro/Psych negative neurological ROS  negative psych ROS   GI/Hepatic negative GI ROS, Neg liver ROS,   Endo/Other  diabetes, Type 2Hypothyroidism Obesity Lupus  Renal/GU Renal InsufficiencyRenal disease     Musculoskeletal  (+) Arthritis , left knee arthrofibrosis   Abdominal   Peds  Hematology  (+) Blood dyscrasia (Eliquis), anemia ,   Anesthesia Other Findings Day of surgery medications reviewed with the patient.  Reproductive/Obstetrics                            Anesthesia Physical Anesthesia Plan  ASA: 3  Anesthesia Plan: General   Post-op Pain Management: Ofirmev IV (intra-op)*   Induction: Intravenous  PONV Risk Score and Plan: 3 and TIVA and Treatment may vary due to age or medical condition  Airway Management Planned: Natural Airway and Simple Face Mask  Additional Equipment:   Intra-op Plan:   Post-operative Plan:   Informed Consent: I have reviewed the patients History and Physical, chart, labs and discussed the procedure including the risks, benefits and alternatives for the proposed anesthesia with the patient or authorized representative who has indicated his/her understanding and acceptance.     Dental advisory given  Plan Discussed with: CRNA and  Anesthesiologist  Anesthesia Plan Comments:       Anesthesia Quick Evaluation

## 2022-07-06 NOTE — Transfer of Care (Signed)
Immediate Anesthesia Transfer of Care Note  Patient: Hannah [name]  Procedure(s) Performed: CLOSED MANIPULATION KNEE (Left: Knee)  Patient Location: PACU  Anesthesia Type:MAC  Level of Consciousness: awake, alert  and patient cooperative  Airway & Oxygen Therapy: Patient Spontanous Breathing and Patient connected to face mask oxygen  Post-op Assessment: Report given to RN, Post -op Vital signs reviewed and stable and Patient moving all extremities X 4  Post vital signs: Reviewed and stable  Last Vitals:  Vitals Value Taken Time  BP 129/76 07/06/22 1554  Temp    Pulse 62 07/06/22 1556  Resp 12 07/06/22 1556  SpO2 100 % 07/06/22 1556  Vitals shown include unvalidated device data.  Last Pain:  Vitals:   07/06/22 1221  TempSrc:   PainSc: 0-No pain         Complications: No notable events documented.

## 2022-07-07 ENCOUNTER — Encounter (HOSPITAL_COMMUNITY): Payer: Self-pay | Admitting: Orthopedic Surgery

## 2022-07-07 DIAGNOSIS — R2689 Other abnormalities of gait and mobility: Secondary | ICD-10-CM | POA: Diagnosis not present

## 2022-07-07 DIAGNOSIS — M6281 Muscle weakness (generalized): Secondary | ICD-10-CM | POA: Diagnosis not present

## 2022-07-07 DIAGNOSIS — M25562 Pain in left knee: Secondary | ICD-10-CM | POA: Diagnosis not present

## 2022-07-08 ENCOUNTER — Other Ambulatory Visit: Payer: Self-pay

## 2022-07-08 ENCOUNTER — Inpatient Hospital Stay: Payer: Medicare HMO | Attending: Hematology | Admitting: Hematology

## 2022-07-08 VITALS — BP 171/71 | HR 72 | Temp 98.1°F | Resp 18 | Ht 59.0 in | Wt 168.2 lb

## 2022-07-08 DIAGNOSIS — M4316 Spondylolisthesis, lumbar region: Secondary | ICD-10-CM | POA: Diagnosis not present

## 2022-07-08 DIAGNOSIS — I4891 Unspecified atrial fibrillation: Secondary | ICD-10-CM | POA: Insufficient documentation

## 2022-07-08 DIAGNOSIS — K449 Diaphragmatic hernia without obstruction or gangrene: Secondary | ICD-10-CM | POA: Diagnosis not present

## 2022-07-08 DIAGNOSIS — Z7901 Long term (current) use of anticoagulants: Secondary | ICD-10-CM | POA: Insufficient documentation

## 2022-07-08 DIAGNOSIS — M329 Systemic lupus erythematosus, unspecified: Secondary | ICD-10-CM | POA: Diagnosis not present

## 2022-07-08 DIAGNOSIS — J45909 Unspecified asthma, uncomplicated: Secondary | ICD-10-CM | POA: Insufficient documentation

## 2022-07-08 DIAGNOSIS — D649 Anemia, unspecified: Secondary | ICD-10-CM | POA: Insufficient documentation

## 2022-07-08 DIAGNOSIS — D472 Monoclonal gammopathy: Secondary | ICD-10-CM | POA: Insufficient documentation

## 2022-07-08 DIAGNOSIS — M069 Rheumatoid arthritis, unspecified: Secondary | ICD-10-CM | POA: Insufficient documentation

## 2022-07-08 DIAGNOSIS — N1831 Chronic kidney disease, stage 3a: Secondary | ICD-10-CM | POA: Insufficient documentation

## 2022-07-08 DIAGNOSIS — E1122 Type 2 diabetes mellitus with diabetic chronic kidney disease: Secondary | ICD-10-CM | POA: Diagnosis not present

## 2022-07-08 DIAGNOSIS — E039 Hypothyroidism, unspecified: Secondary | ICD-10-CM | POA: Insufficient documentation

## 2022-07-08 DIAGNOSIS — M25562 Pain in left knee: Secondary | ICD-10-CM | POA: Diagnosis not present

## 2022-07-08 DIAGNOSIS — M3214 Glomerular disease in systemic lupus erythematosus: Secondary | ICD-10-CM | POA: Diagnosis not present

## 2022-07-08 DIAGNOSIS — I129 Hypertensive chronic kidney disease with stage 1 through stage 4 chronic kidney disease, or unspecified chronic kidney disease: Secondary | ICD-10-CM | POA: Diagnosis not present

## 2022-07-08 DIAGNOSIS — Z79899 Other long term (current) drug therapy: Secondary | ICD-10-CM | POA: Diagnosis not present

## 2022-07-08 DIAGNOSIS — R2689 Other abnormalities of gait and mobility: Secondary | ICD-10-CM | POA: Diagnosis not present

## 2022-07-08 DIAGNOSIS — M47816 Spondylosis without myelopathy or radiculopathy, lumbar region: Secondary | ICD-10-CM | POA: Insufficient documentation

## 2022-07-08 DIAGNOSIS — M6281 Muscle weakness (generalized): Secondary | ICD-10-CM | POA: Diagnosis not present

## 2022-07-08 NOTE — Progress Notes (Signed)
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 Chestang is here for followup for her MGUS and anemia. The patient's last visit with [place] was on 07/16/2021 and was doing well.   She denies of any new bleeding issues.   She regularly visits her rheumatologist. She is receiving Prolia injections.  She is compliant with her medications. She is not taking multi-vitamin.   She reports that she has been on IV Iron previously.   She is taking calcium supplement with iron supplement.   She reports she is not taking oxycodone and muscle relaxer anymore. She had her left knee surgery in May 2023 with a follow-up manipulation last week and it went well. She is currently going to PT.  REVIEW OF SYSTEMS:   10 Point review of Systems was done is negative except as noted above.  . Past Medical History:  Diagnosis Date   Anemia    Arthritis    Asthma    Atrial fibrillation ([place])    Chronic kidney disease    Diabetes mellitus without complication ([place])    Gout 12/17/2014   patient reported   Hyperlipidemia    Hypertension    Hypothyroidism    Systemic lupus erythematosus ([place])    Vitiligo   possible SLE/rheumatoid arthritis following with Dr.Deveshwar.  . Past Surgical History:  Procedure Laterality Date   CATARACT EXTRACTION Bilateral 2015   DILATION AND CURETTAGE OF UTERUS     DOPPLER ECHOCARDIOGRAPHY  05/2018   Internist to review with pt; potential heart murmur 06/20/18   keratosis removal  2021   KNEE CLOSED REDUCTION Left 07/06/2022    Procedure: CLOSED MANIPULATION KNEE;  Surgeon: Gaynelle Arabian, MD;  Location: [facility];  Service: Orthopedics;  Laterality: Left;   SKIN SURGERY  11/30/2018   left side of face   TOOTH EXTRACTION     TOTAL KNEE ARTHROPLASTY Left 02/02/2022   Procedure: TOTAL KNEE ARTHROPLASTY;  Surgeon: Gaynelle Arabian, MD;  Location: [facility];  Service: Orthopedics;  Laterality: Left;    . Social History   Tobacco Use   Smoking status: Never    Passive exposure: Never   Smokeless tobacco: Never  Vaping Use   Vaping Use: Never used  Substance Use Topics   Alcohol use: Not Currently   Drug use: No    ALLERGIES:  is allergic to shellfish allergy.  MEDICATIONS:  Current Outpatient Medications  Medication Sig Dispense Refill   Accu-Chek Softclix Lancets lancets Use to check blood sugars daily E11.69 100 each 2   albuterol (PROVENTIL HFA;VENTOLIN HFA) 108 (90 Base) MCG/ACT inhaler Inhale 2 puffs into the lungs every 6 (six) hours as needed for wheezing or shortness of breath. 1 Inhaler 2   Alcohol Swabs (ALCOHOL PADS) 70 % PADS Use as directed to check blood sugars 1 time per day dx: e11.22 150 each 2   allopurinol (ZYLOPRIM) 100 MG tablet TAKE 1 TABLET BY MOUTH EVERY DAY FOR GOUT 90 tablet 1   amLODipine (NORVASC) 2.5 MG tablet TAKE 1 TABLET(2.5 MG) BY MOUTH DAILY 30  tablet 11   apixaban (ELIQUIS) 5 MG TABS tablet Take 1 tablet (5 mg total) by mouth 2 (two) times daily. 180 tablet 1   Ascorbic Acid (VITAMIN C) 1000 MG tablet Take 1,000 mg by mouth daily.     atorvastatin (LIPITOR) 80 MG tablet TAKE 1 TABLET BY MOUTH EVERY DAY 90 tablet 1   budesonide-formoterol (SYMBICORT) 160-4.5 MCG/ACT inhaler INHALE 2 PUFFS BY MOUTH TWICE DAILY IN THE MORNING AND IN THE EVENING (Patient taking differently: Inhale 2 puffs into the lungs daily as needed (shortness of breath or wheezing).) 10.2 g 2   Calcium Carb-Cholecalciferol 600-800 MG-UNIT TABS Take 1 tablet by mouth daily.      Cholecalciferol (VITAMIN D PO) Take  2,000 Units by mouth daily.     cyanocobalamin 1000 MCG tablet Take 1,000 mcg by mouth daily.     denosumab (PROLIA) 60 MG/ML SOSY injection Inject 60 mg into the skin every 6 (six) months. Courier to rheum: [address] [place], [place] [address]. Appt on 06/09/22 1 mL 0   glucose blood (ACCU-CHEK GUIDE) test strip Use to check blood sugars daily E11.69 100 each 2   hydroxychloroquine (PLAQUENIL) 200 MG tablet TAKE 1 TABLET(200 MG) BY MOUTH DAILY 90 tablet 0   Hypromellose (ARTIFICIAL TEARS OP) Apply 1 drop to eye daily as needed (dry eyes).     irbesartan (AVAPRO) 300 MG tablet TAKE 1 TABLET(300 MG) BY MOUTH DAILY 90 tablet 2   iron polysaccharides (NIFEREX) 150 MG capsule Take 150 mg by mouth daily.     methocarbamol (ROBAXIN) 500 MG tablet Take 1 tablet (500 mg total) by mouth every 6 (six) hours as needed for muscle spasms. 40 tablet 0   metoprolol succinate (TOPROL-XL) 25 MG 24 hr tablet Take 1 tablet (25 mg total) by mouth daily. 90 tablet 1   oxyCODONE (OXY IR/ROXICODONE) 5 MG immediate release tablet Take 1-2 tablets (5-10 mg total) by mouth every 6 (six) hours as needed for severe pain. 30 tablet 0   SYNTHROID 88 MCG tablet TAKE 1 TABLET BY MOUTH EVERY DAY MONDAY TO SATURDAY AND OFF ON SUNDAYS 90 tablet 0   traMADol (ULTRAM) 50 MG tablet Take 1-2 tablets (50-100 mg total) by mouth every 6 (six) hours as needed for moderate pain. 40 tablet 0   valsartan-hydrochlorothiazide (DIOVAN-HCT) 160-25 MG tablet Take 1 tablet by mouth daily.     No current facility-administered medications for this visit.    PHYSICAL EXAMINATION: ECOG PERFORMANCE STATUS: 1 - Symptomatic but completely ambulatory  Vitals:   07/08/22 1000  BP: (!) 171/71  Pulse: 72  Resp: 18  Temp: 98.1 F (36.7 C)  SpO2: 99%     Filed Weights   07/08/22 1000  Weight: 168 lb 3.2 oz (76.3 kg)    .Body mass index is 33.97 kg/m.   NAD GENERAL:alert, in no acute distress and comfortable SKIN: no acute  rashes, no significant lesions EYES: conjunctiva are pink and non-injected, sclera anicteric OROPHARYNX: MMM, no exudates, no oropharyngeal erythema or ulceration NECK: supple, no JVD LYMPH:  no palpable lymphadenopathy in the cervical, axillary or inguinal regions LUNGS: clear to auscultation b/l with normal respiratory effort HEART: regular rate & rhythm ABDOMEN:  normoactive bowel sounds , non tender, not distended. No palpable hepatosplenomegaly.  Extremity: no pedal edema PSYCH: alert & oriented x 3 with fluent speech NEURO: no focal motor/sensory deficits  LABORATORY DATA:   I have reviewed the data as listed  .    Latest  Ref Rng & Units 06/23/2022    7:35 AM 05/13/2022    3:19 PM 02/03/2022    3:32 AM  CBC  WBC 4.0 - 10.5 K/uL 5.0  4.9  7.9   Hemoglobin 12.0 - 15.0 g/dL 9.0  9.2  9.2   Hematocrit 36.0 - 46.0 % 27.8  28.4  28.4   Platelets 150 - 400 K/uL 160  163  132    .    Latest Ref Rng & Units 06/23/2022    7:35 AM 05/13/2022    3:19 PM 02/03/2022    3:32 AM  CMP  Glucose 70 - 99 mg/dL 95  89  149   BUN 8 - 23 mg/dL 23  15  33   Creatinine 0.44 - 1.00 mg/dL 1.05  0.98  1.17   Sodium 135 - 145 mmol/L 141  141  137   Potassium 3.5 - 5.1 mmol/L 3.9  4.1  4.5   Chloride 98 - 111 mmol/L 109  107  108   CO2 22 - 32 mmol/L 28  28  22    Calcium 8.9 - 10.3 mg/dL 9.0  9.2  8.1   Total Protein 6.5 - 8.1 g/dL 6.4  6.1    Total Bilirubin 0.3 - 1.2 mg/dL 0.5  0.3    Alkaline Phos 38 - 126 U/L 53     AST 15 - 41 U/L 18  18    ALT 0 - 44 U/L 14  11     Lab Results  Component Value Date   IRON 35 06/23/2022   TIBC 336 06/23/2022   IRONPCTSAT 10 (L) 06/23/2022   (Iron and TIBC)  Lab Results  Component Value Date   FERRITIN 229 07/14/2021          RADIOGRAPHIC STUDIES: I have personally reviewed the radiological images as listed and agreed with the findings in the report.  METASTATIC BONE SURVEY 0/02/2015   COMPARISON:  None.   FINDINGS: Heart is normal  size. Right diaphragmatic hernia again noted, unchanged. Lungs are clear. No effusions.   No focal lytic lesions within the visualized bony structures or acute bony abnormality. Degenerative changes within the shoulders, lower cervical spine, mid to lower thoracic spine, and lower lumbar spine. Advanced degenerative facet disease in the lower lumbar spine. 9 mm of anterolisthesis of L4 on L5. Endplate sclerosis within L4 and L5. Mild degenerative changes in the hips. Advanced degenerative changes within the knees. 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   IMPRESSION: No focal lytic lesion.   Degenerative joint disease involving multiple joints as described above.   Grade 2 anterolisthesis of L4 on L5 related to facet disease.   ASSESSMENT & PLAN:   76 year old of an American female with   #1 IgG kappa monoclonal gammopathy of undetermined significance. Bone survey X-ray showed no lytic lesions. M spike is 0.6 g/dL in 10/016 and then 0.5g/dl in 10/2015. SPEP in May/2017 and 07/2016 showed M proteinwas stable at 0.3g/dl  MGUS likely related to underlying connective tissue disorder.  Bone marrow biopsy does show about 13% plasma cells however these appear to be polyclonal and likely related to a possible underlying inflammatory disorder. Less likely a biclonal plasma cell dyscrasia.  #2 Normocytic normochromic anemia likely related to chronic inflammation from recent diagnosis of lupus/rheumatoid arthritis + CKD.  Increased acanthocytes on peripheral blood smear -no overt evidence of liver disease. Could be a marker of some MDS Bone marrow biopsy did not show overt plasma  cell dyscrasia or myelodysplastic syndrome. 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 Bone marrow biopsy showed decreased iron stores. Ferritin and iron have been trending down, she has not noticed any issues with bruising or  bleeding recently. ?absorption issue. I have given her the option of IV iron which she has previously had without issue. She would like to have this.   PLAN: -Discussed pt labwork today, lab results from 06/23/2022 show hemoglobin of 9.0k, WBC count of 5.0k and platelets counts of 160k. --Labs showed she is iron deficient. Recommend IV iron.  -Recommend taking B-complex with the B-12 supplement.  -continue to optimize RA rx with PCP.  FOLLOW UP: IV Venofer 320m weekly x 3 doses RTC with Dr KIrene Limbowith labs in 4 months  The total time spent in the appointment was 20 minutes* .  All of the patient's questions were answered with apparent satisfaction. The patient knows to call the clinic with any problems, questions or concerns.   I,Param Shah,acting as a sEducation administratorfor GSullivan Lone MD.,have documented all relevant documentation on the behalf of GSullivan Lone MD,as directed by  GSullivan Lone MD while in the presence of GSullivan Lone MD.  GSullivan LoneMD MSinaiAAHIVMS SJackson HospitalCTripler Army Medical CenterHematology/Oncology Physician [facility] .*Total Encounter Time as defined by the Centers for Medicare and Medicaid Services includes, in addition to the face-to-face time of a patient visit (documented in the note above) non-face-to-face time: obtaining and reviewing outside history, ordering and reviewing medications, tests or procedures, care coordination (communications with other health care professionals or caregivers) and documentation in the medical record.

## 2022-07-09 DIAGNOSIS — M6281 Muscle weakness (generalized): Secondary | ICD-10-CM | POA: Diagnosis not present

## 2022-07-09 DIAGNOSIS — M25562 Pain in left knee: Secondary | ICD-10-CM | POA: Diagnosis not present

## 2022-07-09 DIAGNOSIS — R2689 Other abnormalities of gait and mobility: Secondary | ICD-10-CM | POA: Diagnosis not present

## 2022-07-10 MED FILL — Iron Sucrose Inj 20 MG/ML (Fe Equiv): INTRAVENOUS | Qty: 15 | Status: AC

## 2022-07-11 ENCOUNTER — Inpatient Hospital Stay: Payer: Medicare HMO

## 2022-07-11 VITALS — BP 139/67 | HR 60 | Temp 98.6°F | Resp 16

## 2022-07-11 DIAGNOSIS — N1831 Chronic kidney disease, stage 3a: Secondary | ICD-10-CM | POA: Diagnosis not present

## 2022-07-11 DIAGNOSIS — D508 Other iron deficiency anemias: Secondary | ICD-10-CM

## 2022-07-11 DIAGNOSIS — I4891 Unspecified atrial fibrillation: Secondary | ICD-10-CM | POA: Diagnosis not present

## 2022-07-11 DIAGNOSIS — K449 Diaphragmatic hernia without obstruction or gangrene: Secondary | ICD-10-CM | POA: Diagnosis not present

## 2022-07-11 DIAGNOSIS — D649 Anemia, unspecified: Secondary | ICD-10-CM | POA: Diagnosis not present

## 2022-07-11 DIAGNOSIS — I129 Hypertensive chronic kidney disease with stage 1 through stage 4 chronic kidney disease, or unspecified chronic kidney disease: Secondary | ICD-10-CM | POA: Diagnosis not present

## 2022-07-11 DIAGNOSIS — E1122 Type 2 diabetes mellitus with diabetic chronic kidney disease: Secondary | ICD-10-CM | POA: Diagnosis not present

## 2022-07-11 DIAGNOSIS — M3214 Glomerular disease in systemic lupus erythematosus: Secondary | ICD-10-CM | POA: Diagnosis not present

## 2022-07-11 DIAGNOSIS — D472 Monoclonal gammopathy: Secondary | ICD-10-CM | POA: Diagnosis not present

## 2022-07-11 DIAGNOSIS — E039 Hypothyroidism, unspecified: Secondary | ICD-10-CM | POA: Diagnosis not present

## 2022-07-11 MED ORDER — ACETAMINOPHEN 325 MG PO TABS
650.0000 mg | ORAL_TABLET | Freq: Once | ORAL | Status: AC
  Administered 2022-07-11: 650 mg via ORAL
  Filled 2022-07-11: qty 2

## 2022-07-11 MED ORDER — SODIUM CHLORIDE 0.9 % IV SOLN
300.0000 mg | Freq: Once | INTRAVENOUS | Status: AC
  Administered 2022-07-11: 300 mg via INTRAVENOUS
  Filled 2022-07-11: qty 300

## 2022-07-11 MED ORDER — SODIUM CHLORIDE 0.9 % IV SOLN: Freq: Once | INTRAVENOUS | Status: AC

## 2022-07-11 MED ORDER — LORATADINE 10 MG PO TABS
10.0000 mg | ORAL_TABLET | Freq: Once | ORAL | Status: AC
  Administered 2022-07-11: 10 mg via ORAL
  Filled 2022-07-11: qty 1

## 2022-07-13 DIAGNOSIS — M6281 Muscle weakness (generalized): Secondary | ICD-10-CM | POA: Diagnosis not present

## 2022-07-13 DIAGNOSIS — R2689 Other abnormalities of gait and mobility: Secondary | ICD-10-CM | POA: Diagnosis not present

## 2022-07-13 DIAGNOSIS — M25562 Pain in left knee: Secondary | ICD-10-CM | POA: Diagnosis not present

## 2022-07-14 ENCOUNTER — Other Ambulatory Visit: Payer: Self-pay | Admitting: Cardiovascular Disease

## 2022-07-14 ENCOUNTER — Other Ambulatory Visit: Payer: Self-pay | Admitting: Internal Medicine

## 2022-07-15 ENCOUNTER — Encounter: Payer: Self-pay | Admitting: Hematology

## 2022-07-15 DIAGNOSIS — M6281 Muscle weakness (generalized): Secondary | ICD-10-CM | POA: Diagnosis not present

## 2022-07-15 DIAGNOSIS — M25562 Pain in left knee: Secondary | ICD-10-CM | POA: Diagnosis not present

## 2022-07-15 DIAGNOSIS — R2689 Other abnormalities of gait and mobility: Secondary | ICD-10-CM | POA: Diagnosis not present

## 2022-07-16 ENCOUNTER — Other Ambulatory Visit: Payer: Self-pay | Admitting: Internal Medicine

## 2022-07-17 DIAGNOSIS — M6281 Muscle weakness (generalized): Secondary | ICD-10-CM | POA: Diagnosis not present

## 2022-07-17 DIAGNOSIS — R2689 Other abnormalities of gait and mobility: Secondary | ICD-10-CM | POA: Diagnosis not present

## 2022-07-17 DIAGNOSIS — M25562 Pain in left knee: Secondary | ICD-10-CM | POA: Diagnosis not present

## 2022-07-17 MED FILL — Iron Sucrose Inj 20 MG/ML (Fe Equiv): INTRAVENOUS | Qty: 15 | Status: AC

## 2022-07-18 ENCOUNTER — Inpatient Hospital Stay: Payer: Medicare HMO

## 2022-07-18 VITALS — BP 143/74 | HR 65 | Temp 97.7°F | Resp 17

## 2022-07-18 DIAGNOSIS — D649 Anemia, unspecified: Secondary | ICD-10-CM | POA: Diagnosis not present

## 2022-07-18 DIAGNOSIS — K449 Diaphragmatic hernia without obstruction or gangrene: Secondary | ICD-10-CM | POA: Diagnosis not present

## 2022-07-18 DIAGNOSIS — E039 Hypothyroidism, unspecified: Secondary | ICD-10-CM | POA: Diagnosis not present

## 2022-07-18 DIAGNOSIS — N1831 Chronic kidney disease, stage 3a: Secondary | ICD-10-CM | POA: Diagnosis not present

## 2022-07-18 DIAGNOSIS — I129 Hypertensive chronic kidney disease with stage 1 through stage 4 chronic kidney disease, or unspecified chronic kidney disease: Secondary | ICD-10-CM | POA: Diagnosis not present

## 2022-07-18 DIAGNOSIS — D508 Other iron deficiency anemias: Secondary | ICD-10-CM

## 2022-07-18 DIAGNOSIS — E1122 Type 2 diabetes mellitus with diabetic chronic kidney disease: Secondary | ICD-10-CM | POA: Diagnosis not present

## 2022-07-18 DIAGNOSIS — M3214 Glomerular disease in systemic lupus erythematosus: Secondary | ICD-10-CM | POA: Diagnosis not present

## 2022-07-18 DIAGNOSIS — I4891 Unspecified atrial fibrillation: Secondary | ICD-10-CM | POA: Diagnosis not present

## 2022-07-18 DIAGNOSIS — D472 Monoclonal gammopathy: Secondary | ICD-10-CM | POA: Diagnosis not present

## 2022-07-18 MED ORDER — SODIUM CHLORIDE 0.9 % IV SOLN
300.0000 mg | Freq: Once | INTRAVENOUS | Status: AC
  Administered 2022-07-18: 300 mg via INTRAVENOUS
  Filled 2022-07-18: qty 300

## 2022-07-18 MED ORDER — LORATADINE 10 MG PO TABS
10.0000 mg | ORAL_TABLET | Freq: Once | ORAL | Status: AC
  Administered 2022-07-18: 10 mg via ORAL

## 2022-07-18 MED ORDER — SODIUM CHLORIDE 0.9 % IV SOLN: Freq: Once | INTRAVENOUS | Status: AC

## 2022-07-18 MED ORDER — ACETAMINOPHEN 325 MG PO TABS
650.0000 mg | ORAL_TABLET | Freq: Once | ORAL | Status: AC
  Administered 2022-07-18: 650 mg via ORAL

## 2022-07-18 NOTE — Patient Instructions (Signed)

## 2022-07-20 DIAGNOSIS — M6281 Muscle weakness (generalized): Secondary | ICD-10-CM | POA: Diagnosis not present

## 2022-07-20 DIAGNOSIS — R2689 Other abnormalities of gait and mobility: Secondary | ICD-10-CM | POA: Diagnosis not present

## 2022-07-20 DIAGNOSIS — M25562 Pain in left knee: Secondary | ICD-10-CM | POA: Diagnosis not present

## 2022-07-24 DIAGNOSIS — M6281 Muscle weakness (generalized): Secondary | ICD-10-CM | POA: Diagnosis not present

## 2022-07-24 DIAGNOSIS — M25562 Pain in left knee: Secondary | ICD-10-CM | POA: Diagnosis not present

## 2022-07-24 DIAGNOSIS — R2689 Other abnormalities of gait and mobility: Secondary | ICD-10-CM | POA: Diagnosis not present

## 2022-07-24 MED FILL — Iron Sucrose Inj 20 MG/ML (Fe Equiv): INTRAVENOUS | Qty: 15 | Status: AC

## 2022-07-25 ENCOUNTER — Inpatient Hospital Stay: Payer: Medicare HMO

## 2022-07-25 VITALS — BP 138/54 | HR 59 | Temp 98.1°F | Resp 18 | Ht 59.0 in | Wt 168.3 lb

## 2022-07-25 DIAGNOSIS — E039 Hypothyroidism, unspecified: Secondary | ICD-10-CM | POA: Diagnosis not present

## 2022-07-25 DIAGNOSIS — E1122 Type 2 diabetes mellitus with diabetic chronic kidney disease: Secondary | ICD-10-CM | POA: Diagnosis not present

## 2022-07-25 DIAGNOSIS — D508 Other iron deficiency anemias: Secondary | ICD-10-CM

## 2022-07-25 DIAGNOSIS — D649 Anemia, unspecified: Secondary | ICD-10-CM | POA: Diagnosis not present

## 2022-07-25 DIAGNOSIS — I4891 Unspecified atrial fibrillation: Secondary | ICD-10-CM | POA: Diagnosis not present

## 2022-07-25 DIAGNOSIS — I129 Hypertensive chronic kidney disease with stage 1 through stage 4 chronic kidney disease, or unspecified chronic kidney disease: Secondary | ICD-10-CM | POA: Diagnosis not present

## 2022-07-25 DIAGNOSIS — N1831 Chronic kidney disease, stage 3a: Secondary | ICD-10-CM | POA: Diagnosis not present

## 2022-07-25 DIAGNOSIS — K449 Diaphragmatic hernia without obstruction or gangrene: Secondary | ICD-10-CM | POA: Diagnosis not present

## 2022-07-25 DIAGNOSIS — D472 Monoclonal gammopathy: Secondary | ICD-10-CM | POA: Diagnosis not present

## 2022-07-25 DIAGNOSIS — M3214 Glomerular disease in systemic lupus erythematosus: Secondary | ICD-10-CM | POA: Diagnosis not present

## 2022-07-25 MED ORDER — LORATADINE 10 MG PO TABS
10.0000 mg | ORAL_TABLET | Freq: Once | ORAL | Status: AC
  Administered 2022-07-25: 10 mg via ORAL
  Filled 2022-07-25: qty 1

## 2022-07-25 MED ORDER — ACETAMINOPHEN 325 MG PO TABS
650.0000 mg | ORAL_TABLET | Freq: Once | ORAL | Status: AC
  Administered 2022-07-25: 650 mg via ORAL
  Filled 2022-07-25: qty 2

## 2022-07-25 MED ORDER — SODIUM CHLORIDE 0.9 % IV SOLN: Freq: Once | INTRAVENOUS | Status: AC

## 2022-07-25 MED ORDER — SODIUM CHLORIDE 0.9 % IV SOLN
300.0000 mg | Freq: Once | INTRAVENOUS | Status: AC
  Administered 2022-07-25: 300 mg via INTRAVENOUS
  Filled 2022-07-25: qty 300

## 2022-07-27 DIAGNOSIS — R2689 Other abnormalities of gait and mobility: Secondary | ICD-10-CM | POA: Diagnosis not present

## 2022-07-27 DIAGNOSIS — M6281 Muscle weakness (generalized): Secondary | ICD-10-CM | POA: Diagnosis not present

## 2022-07-27 DIAGNOSIS — M25562 Pain in left knee: Secondary | ICD-10-CM | POA: Diagnosis not present

## 2022-07-29 ENCOUNTER — Ambulatory Visit (INDEPENDENT_AMBULATORY_CARE_PROVIDER_SITE_OTHER): Payer: Medicare HMO | Admitting: Internal Medicine

## 2022-07-29 ENCOUNTER — Other Ambulatory Visit: Payer: Self-pay

## 2022-07-29 ENCOUNTER — Encounter: Payer: Self-pay | Admitting: Internal Medicine

## 2022-07-29 ENCOUNTER — Ambulatory Visit: Payer: Medicare HMO

## 2022-07-29 VITALS — BP 130/70 | HR 80 | Temp 98.5°F | Ht 59.0 in | Wt 165.8 lb

## 2022-07-29 DIAGNOSIS — D6869 Other thrombophilia: Secondary | ICD-10-CM

## 2022-07-29 DIAGNOSIS — N1831 Chronic kidney disease, stage 3a: Secondary | ICD-10-CM | POA: Diagnosis not present

## 2022-07-29 DIAGNOSIS — I1310 Hypertensive heart and chronic kidney disease without heart failure, with stage 1 through stage 4 chronic kidney disease, or unspecified chronic kidney disease: Secondary | ICD-10-CM | POA: Diagnosis not present

## 2022-07-29 DIAGNOSIS — I480 Paroxysmal atrial fibrillation: Secondary | ICD-10-CM

## 2022-07-29 DIAGNOSIS — E1122 Type 2 diabetes mellitus with diabetic chronic kidney disease: Secondary | ICD-10-CM

## 2022-07-29 DIAGNOSIS — Z6833 Body mass index (BMI) 33.0-33.9, adult: Secondary | ICD-10-CM | POA: Diagnosis not present

## 2022-07-29 DIAGNOSIS — M1A09X0 Idiopathic chronic gout, multiple sites, without tophus (tophi): Secondary | ICD-10-CM

## 2022-07-29 MED ORDER — SYNTHROID 88 MCG PO TABS: ORAL_TABLET | ORAL | 0 refills | Status: DC

## 2022-07-29 NOTE — Patient Instructions (Signed)

## 2022-07-29 NOTE — Progress Notes (Signed)
Barnet Glasgow Martin,acting as a Education administrator for [name], MD.,have documented all relevant documentation on the behalf of [name], MD,as directed by  [name], MD while in the presence of [name], MD.   Subjective:     Patient ID: Hannah [name] , female    DOB: [birth year] , 76 y.o.   MRN: [id number]   Chief Complaint  Patient presents with   Diabetes   Hypertension    HPI  She presents for diabetes and blood pressure check. She reports compliance with meds.She denies headaches, chest pain and shortness of breath. Patient does want recommendation for the RSV vaccine. She had a left TKR in May 2023. Unfortunately, she had to have repeat surgery due to decreased ROM. She feels she has taken a step backwards since the surgery in October 2023. She is now back walking with a cane and she is in more pain.   BP Readings from Last 3 Encounters: 07/29/22 : (!) 140/72 07/25/22 : (!) 138/54 07/18/22 : (!) 143/74     Diabetes She presents for her follow-up diabetic visit. She has type 2 diabetes mellitus. There are no hypoglycemic associated symptoms. Pertinent negatives for hypoglycemia include no headaches. Pertinent negatives for diabetes include no blurred vision and no chest pain. There are no hypoglycemic complications. Diabetic complications include nephropathy. Risk factors for coronary artery disease include diabetes mellitus, dyslipidemia, hypertension, post-menopausal and sedentary lifestyle. She is following a diabetic diet. She participates in exercise three times a week. An ACE inhibitor/angiotensin II receptor blocker is being taken.  Hypertension This is a chronic problem. The current episode started more than 1 year ago. The problem has been gradually improving since onset. The problem is controlled. Pertinent negatives include no blurred vision, chest pain or headaches.     Past Medical History:  Diagnosis Date   Anemia    Arthritis    Asthma     Atrial fibrillation ([place])    Chronic kidney disease    Diabetes mellitus without complication ([place])    Gout 12/17/2014   patient reported   Hyperlipidemia    Hypertension    Hypothyroidism    Systemic lupus erythematosus ([place])    Vitiligo      Family History  Problem Relation Age of Onset   Heart disease Father    Diabetes Father    Hypertension Mother    Hypertension Sister    Hypertension Sister    Leukemia Sister      Current Outpatient Medications:    Accu-Chek Softclix Lancets lancets, Use to check blood sugars daily E11.69, Disp: 100 each, Rfl: 2   albuterol (PROVENTIL HFA;VENTOLIN HFA) 108 (90 Base) MCG/ACT inhaler, Inhale 2 puffs into the lungs every 6 (six) hours as needed for wheezing or shortness of breath., Disp: 1 Inhaler, Rfl: 2   Alcohol Swabs (ALCOHOL PADS) 70 % PADS, Use as directed to check blood sugars 1 time per day dx: e11.22, Disp: 150 each, Rfl: 2   allopurinol (ZYLOPRIM) 100 MG tablet, TAKE 1 TABLET BY MOUTH EVERY DAY FOR GOUT, Disp: 90 tablet, Rfl: 1   amLODipine (NORVASC) 2.5 MG tablet, TAKE 1 TABLET(2.5 MG) BY MOUTH DAILY, Disp: 30 tablet, Rfl: 11   apixaban (ELIQUIS) 5 MG TABS tablet, Take 1 tablet (5 mg total) by mouth 2 (two) times daily., Disp: 180 tablet, Rfl: 1   Ascorbic Acid (VITAMIN C) 1000 MG tablet, Take 1,000 mg by mouth daily., Disp: , Rfl:    atorvastatin (LIPITOR)  80 MG tablet, TAKE 1 TABLET BY MOUTH EVERY DAY, Disp: 90 tablet, Rfl: 1   budesonide-formoterol (SYMBICORT) 160-4.5 MCG/ACT inhaler, INHALE 2 PUFFS BY MOUTH TWICE DAILY IN THE MORNING AND IN THE EVENING (Patient taking differently: Inhale 2 puffs into the lungs daily as needed (shortness of breath or wheezing).), Disp: 10.2 g, Rfl: 2   Calcium Carb-Cholecalciferol 600-800 MG-UNIT TABS, Take 1 tablet by mouth daily. , Disp: , Rfl:    Cholecalciferol (VITAMIN D PO), Take 2,000 Units by mouth daily., Disp: , Rfl:    cyanocobalamin 1000 MCG tablet, Take 1,000 mcg by mouth daily.,  Disp: , Rfl:    denosumab (PROLIA) 60 MG/ML SOSY injection, Inject 60 mg into the skin every 6 (six) months. Courier to rheum: [address], [place], [place] [address]. Appt on 06/09/22, Disp: 1 mL, Rfl: 0   glucose blood (ACCU-CHEK GUIDE) test strip, Use to check blood sugars daily E11.69, Disp: 100 each, Rfl: 2   hydroxychloroquine (PLAQUENIL) 200 MG tablet, TAKE 1 TABLET(200 MG) BY MOUTH DAILY, Disp: 90 tablet, Rfl: 0   Hypromellose (ARTIFICIAL TEARS OP), Apply 1 drop to eye daily as needed (dry eyes)., Disp: , Rfl:    irbesartan (AVAPRO) 300 MG tablet, TAKE 1 TABLET(300 MG) BY MOUTH DAILY, Disp: 90 tablet, Rfl: 2   iron polysaccharides (NIFEREX) 150 MG capsule, Take 150 mg by mouth daily., Disp: , Rfl:    methocarbamol (ROBAXIN) 500 MG tablet, Take 1 tablet (500 mg total) by mouth every 6 (six) hours as needed for muscle spasms., Disp: 40 tablet, Rfl: 0   metoprolol succinate (TOPROL-XL) 25 MG 24 hr tablet, TAKE 1 TABLET(25 MG) BY MOUTH DAILY, Disp: 90 tablet, Rfl: 1   oxyCODONE (OXY IR/ROXICODONE) 5 MG immediate release tablet, Take 1-2 tablets (5-10 mg total) by mouth every 6 (six) hours as needed for severe pain., Disp: 30 tablet, Rfl: 0   SYNTHROID 88 MCG tablet, TAKE 1 TABLET BY MOUTH EVERY DAY MONDAY TO SATURDAY AND OFF ON SUNDAYS, Disp: 90 tablet, Rfl: 0   traMADol (ULTRAM) 50 MG tablet, Take 1-2 tablets (50-100 mg total) by mouth every 6 (six) hours as needed for moderate pain., Disp: 40 tablet, Rfl: 0   valsartan-hydrochlorothiazide (DIOVAN-HCT) 160-25 MG tablet, Take 1 tablet by mouth daily., Disp: , Rfl:    Allergies  Allergen Reactions   Shellfish Allergy Anaphylaxis and Hives     Review of Systems  Constitutional: Negative.   HENT: Negative.    Eyes: Negative.  Negative for blurred vision.  Respiratory: Negative.    Cardiovascular: Negative.  Negative for chest pain.  Gastrointestinal: Negative.   Neurological:  Negative for headaches.     Today's Vitals    07/29/22 1450 07/29/22 1550  BP: (!) 140/72 130/70  Pulse: 80   Temp: 98.5 F (36.9 C)   TempSrc: Oral   Weight: 165 lb 12.8 oz (75.2 kg)   Height: 4' 11" (1.499 m)   PainSc: 5    PainLoc: Knee    Body mass index is 33.49 kg/m.  Wt Readings from Last 3 Encounters:  07/29/22 165 lb 12.8 oz (75.2 kg)  07/25/22 168 lb 4.8 oz (76.3 kg)  07/08/22 168 lb 3.2 oz (76.3 kg)    Objective:  Physical Exam Vitals and nursing note reviewed.  Constitutional:      Appearance: Normal appearance.  HENT:     Head: Normocephalic and atraumatic.     Nose:     Comments: Masked  Mouth/Throat:     Comments: Masked  Eyes:     Extraocular Movements: Extraocular movements intact.  Cardiovascular:     Rate and Rhythm: Normal rate and regular rhythm.     Heart sounds: Normal heart sounds.  Pulmonary:     Effort: Pulmonary effort is normal.     Breath sounds: Normal breath sounds.  Musculoskeletal:     Cervical back: Normal range of motion.  Skin:    General: Skin is warm.  Neurological:     General: No focal deficit present.     Mental Status: She is alert.  Psychiatric:        Mood and Affect: Mood normal.        Behavior: Behavior normal.         Assessment And Plan:     1. Type 2 diabetes mellitus with stage 3a chronic kidney disease, without long-term current use of insulin (HCC) Comments: Chronic, I will check labs as below. She agrees to rto in 3-4 months for re-evaluation. - Hemoglobin A1c - BMP8+eGFR  2. Hypertensive heart and renal disease with renal failure, stage 1 through stage 4 or unspecified chronic kidney disease, without heart failure Comments: Chronic, fair control. NO med changes today, she is having some discomfort. She is encouraged to follow low sodium diet.  3. Paroxysmal atrial fibrillation (HCC) Comments: Chronic, currently in sinus rhythm. She is properly anticoagulated w/ Eliquis.  4. Acquired thrombophilia ([place]) Comments: She is on Eliquis due to  PAF.  5. Idiopathic chronic gout of multiple sites without tophus Comments: She will decrease dosing to MWF for allopurinol. IF she is asymptomatic with this dosing schedule, we plan to wean her off completely within the next 6-8 weeks. - Uric acid  6. Adult BMI 33.0-33.9 kg/sq m Comments: She is encouraged to gradually increase her daily activity as per Ortho.   Patient was given opportunity to ask questions. Patient verbalized understanding of the plan and was able to repeat key elements of the plan. All questions were answered to their satisfaction.   I, [name], MD, have reviewed all documentation for this visit. The documentation on 07/29/22 for the exam, diagnosis, procedures, and orders are all accurate and complete.   IF YOU HAVE BEEN REFERRED TO A SPECIALIST, IT MAY TAKE 1-2 WEEKS TO SCHEDULE/PROCESS THE REFERRAL. IF YOU HAVE NOT HEARD FROM US/SPECIALIST IN TWO WEEKS, PLEASE GIVE [place] A CALL AT [phone].   THE PATIENT IS ENCOURAGED TO PRACTICE SOCIAL DISTANCING DUE TO THE COVID-19 PANDEMIC.

## 2022-07-30 LAB — BMP8+EGFR
BUN/Creatinine Ratio: 12 (ref 12–28)
BUN: 12 mg/dL (ref 8–27)
CO2: 22 mmol/L (ref 20–29)
Calcium: 8.5 mg/dL — ABNORMAL LOW (ref 8.7–10.3)
Chloride: 105 mmol/L (ref 96–106)
Creatinine, Ser: 1.01 mg/dL — ABNORMAL HIGH (ref 0.57–1.00)
Glucose: 95 mg/dL (ref 70–99)
Potassium: 4 mmol/L (ref 3.5–5.2)
Sodium: 141 mmol/L (ref 134–144)
eGFR: 58 mL/min/{1.73_m2} — ABNORMAL LOW (ref >59)

## 2022-07-30 LAB — URIC ACID: Uric Acid: 4.2 mg/dL (ref 3.1–7.9)

## 2022-07-30 LAB — HEMOGLOBIN A1C
Est. average glucose Bld gHb Est-mCnc: 100 mg/dL
Hgb A1c MFr Bld: 5.1 % (ref 4.8–5.6)

## 2022-07-31 ENCOUNTER — Encounter: Payer: Self-pay | Admitting: Internal Medicine

## 2022-07-31 DIAGNOSIS — M6281 Muscle weakness (generalized): Secondary | ICD-10-CM | POA: Diagnosis not present

## 2022-07-31 DIAGNOSIS — M25562 Pain in left knee: Secondary | ICD-10-CM | POA: Diagnosis not present

## 2022-07-31 DIAGNOSIS — R2689 Other abnormalities of gait and mobility: Secondary | ICD-10-CM | POA: Diagnosis not present

## 2022-08-03 ENCOUNTER — Other Ambulatory Visit: Payer: Self-pay

## 2022-08-03 ENCOUNTER — Telehealth: Payer: Self-pay | Admitting: *Deleted

## 2022-08-03 DIAGNOSIS — M25562 Pain in left knee: Secondary | ICD-10-CM | POA: Diagnosis not present

## 2022-08-03 DIAGNOSIS — M6281 Muscle weakness (generalized): Secondary | ICD-10-CM | POA: Diagnosis not present

## 2022-08-03 DIAGNOSIS — E1122 Type 2 diabetes mellitus with diabetic chronic kidney disease: Secondary | ICD-10-CM

## 2022-08-03 DIAGNOSIS — R2689 Other abnormalities of gait and mobility: Secondary | ICD-10-CM | POA: Diagnosis not present

## 2022-08-03 MED ORDER — ALCOHOL PADS 70 % PADS: MEDICATED_PAD | 2 refills | Status: AC

## 2022-08-03 MED ORDER — ACCU-CHEK GUIDE VI STRP: ORAL_STRIP | 2 refills | Status: DC

## 2022-08-03 NOTE — Progress Notes (Signed)
  Care Coordination Note  08/03/2022 Name: Hannah [name] MRN: [id number] DOB: 1946/02/07  Hannah [name] is a 76 y.o. year old female who is a primary care patient of Glendale Chard, MD and is actively engaged with the care management team. I reached out to Hannah [name] by phone today to assist with re-scheduling a follow up visit with the RN Case Manager  Follow up plan: Unsuccessful telephone outreach attempt made. A HIPAA compliant phone message was left for the patient providing contact information and requesting a return call.   [place]  Direct Dial: [phone]

## 2022-08-10 DIAGNOSIS — R059 Cough, unspecified: Secondary | ICD-10-CM | POA: Diagnosis not present

## 2022-08-10 DIAGNOSIS — J019 Acute sinusitis, unspecified: Secondary | ICD-10-CM | POA: Diagnosis not present

## 2022-08-10 NOTE — Progress Notes (Signed)
  Care Coordination Note  08/10/2022 Name: Hannah [name] MRN: [id number] DOB: 02-Jan-1946  Hannah [name] is a 76 y.o. year old female who is a primary care patient of Glendale Chard, MD and is actively engaged with the care management team. I reached out to Hannah [name] by phone today to assist with re-scheduling a follow up visit with the RN Case Manager  Follow up plan: Unsuccessful telephone outreach attempt made. A HIPAA compliant phone message was left for the patient providing contact information and requesting a return call.   [place]  Direct Dial: [phone]

## 2022-08-13 ENCOUNTER — Telehealth: Payer: Self-pay

## 2022-08-13 NOTE — Chronic Care Management (AMB) (Signed)
Faxing 2024 Re-enrollment form to [facility] for Synthroid patient assistance.    Pattricia Boss, [place] Pharmacist Assistant [phone]

## 2022-08-18 ENCOUNTER — Telehealth: Payer: Self-pay | Admitting: Cardiovascular Disease

## 2022-08-18 NOTE — Telephone Encounter (Signed)
Return call to patient to advise she can drop the forms off and they will be completed and faxed to [facility]. She plans to bring them by Monday 11/27. No further concerns.

## 2022-08-18 NOTE — Telephone Encounter (Signed)
Pt c/o medication issue:  1. Name of Medication: Eliquis   2. How are you currently taking this medication (dosage and times per day)?   3. Are you having a reaction (difficulty breathing--STAT)?   4. What is your medication issue? Pt states she has filled out [facility] pt assistance forms for Eliquis and would like to drop them off to be signed by Dr. Acie Fredrickson. She states she would like to drop them off to [facility]. Or [name], please advise

## 2022-08-19 NOTE — Progress Notes (Signed)
  Care Coordination Note  08/19/2022 Name: Hannah [name] MRN: [id number] DOB: 1946/01/06  Hannah [name] is a 76 y.o. year old female who is a primary care patient of Glendale Chard, MD and is actively engaged with the care management team. I reached out to Hannah [name] by phone today to assist with re-scheduling a follow up visit with the RN Case Manager  Follow up plan: Telephone appointment with care management team member scheduled for:09/04/22  [place]  Direct Dial: [phone]

## 2022-08-24 ENCOUNTER — Telehealth: Payer: Self-pay

## 2022-08-24 NOTE — Telephone Encounter (Signed)
[name] (DOD) signed and all pages faxed with confirmation received.

## 2022-08-24 NOTE — Telephone Encounter (Signed)
**Note De-Identified Alphonzo Devera Obfuscation** The pt left her BMSPAF application for Eliquis assistance at the office with documents.  I have completed the providers page of her application and have e-mailed all to Dr Elmarie Shiley nurse so she can obtain the DOD, Dr Kyla Balzarine signature in Dr Elmarie Shiley absence, date it, and to fax all to [facility] at the fax number written on the cover included.

## 2022-08-25 DIAGNOSIS — M1711 Unilateral primary osteoarthritis, right knee: Secondary | ICD-10-CM | POA: Diagnosis not present

## 2022-08-25 DIAGNOSIS — Z5189 Encounter for other specified aftercare: Secondary | ICD-10-CM | POA: Diagnosis not present

## 2022-08-27 DIAGNOSIS — R2689 Other abnormalities of gait and mobility: Secondary | ICD-10-CM | POA: Diagnosis not present

## 2022-08-27 DIAGNOSIS — M25562 Pain in left knee: Secondary | ICD-10-CM | POA: Diagnosis not present

## 2022-08-27 DIAGNOSIS — M6281 Muscle weakness (generalized): Secondary | ICD-10-CM | POA: Diagnosis not present

## 2022-08-31 ENCOUNTER — Telehealth: Payer: Self-pay

## 2022-08-31 NOTE — Chronic Care Management (AMB) (Signed)
    Called [facility], No answer, unable to leave a message of appointment on 09-02-2022 at 11:00 via telephone visit with [name], Pharm D.    Care Gaps: None  Star Rating Drug: Irbesartan 300 mg- Last filled 07-14-2022 90 DS [facility] Atorvastatin 80 mg- Last filled 07-10-2022 90 DS [facility]  Any gaps in medications fill history? No   [place] Pharmacist Assistant [phone]

## 2022-09-01 ENCOUNTER — Other Ambulatory Visit: Payer: Self-pay | Admitting: *Deleted

## 2022-09-01 MED ORDER — APIXABAN 5 MG PO TABS: 5.0000 mg | ORAL_TABLET | Freq: Two times a day (BID) | ORAL | 1 refills | Status: DC

## 2022-09-01 NOTE — Telephone Encounter (Signed)
Eliquis '5mg'$  refill request received. Patient is 76 years old, weight-75.2kg, Crea-1.01 on 07/29/2022, Diagnosis-Afib, and last seen by Dr. Acie Fredrickson on 10/14/2021. Dose is appropriate based on dosing criteria. Will send in refill to requested pharmacy.

## 2022-09-02 ENCOUNTER — Ambulatory Visit (INDEPENDENT_AMBULATORY_CARE_PROVIDER_SITE_OTHER): Payer: Medicare HMO

## 2022-09-02 DIAGNOSIS — I1310 Hypertensive heart and chronic kidney disease without heart failure, with stage 1 through stage 4 chronic kidney disease, or unspecified chronic kidney disease: Secondary | ICD-10-CM

## 2022-09-02 DIAGNOSIS — J4520 Mild intermittent asthma, uncomplicated: Secondary | ICD-10-CM

## 2022-09-02 DIAGNOSIS — D508 Other iron deficiency anemias: Secondary | ICD-10-CM

## 2022-09-02 MED ORDER — ALLOPURINOL 100 MG PO TABS: 100.0000 mg | ORAL_TABLET | ORAL | 6 refills | Status: DC

## 2022-09-02 NOTE — Progress Notes (Signed)
Chronic Care Management Pharmacy Note  09/02/2022 Name:  Hannah [name] MRN:  [id number] DOB:  03-05-1946  Summary: Patient reports that her DEXA scan is scheduled for tomorrow but she is   Recommendations/Changes made from today's visit: Informed patient Symbicort patient assistance will no longer be available next  year Completed changes to patients current medications based on current instructions  Recommended RSV vaccine   Plan: Discontinued Valsartan-HCTZ, Tramadol 50 mg tablet,  Patient is going to call [facility] to determine if Symbicort can be refilled through patient assistance this year Continue to check BP at least three days per week, gave patient HC's phone number  Hannah [name] is going to get RSV Vaccine Patient to call [facility] due to taking calcium yesterday and having  Change directions for Allopurinol to three days per week Congratulated Hannah [name] on her A1c and goals    Subjective: Hannah [name] is an 76 y.o. year old female who is a primary patient of Glendale Chard, MD.  The CCM team was consulted for assistance with disease management and care coordination needs.    Engaged with patient by telephone for follow up visit in response to provider referral for pharmacy case management and/or care coordination services.   Consent to Services:  The patient was given the following information about Chronic Care Management services today, agreed to services, and gave verbal consent: 1. CCM service includes personalized support from designated clinical staff supervised by the primary care provider, including individualized plan of care and coordination with other care providers 2. 24/7 contact phone numbers for assistance for urgent and routine care needs. 3. Service will only be billed when office clinical staff spend 20 minutes or more in a month to coordinate care. 4. Only one practitioner may furnish and bill the service in a calendar month. 5.The patient may  stop CCM services at any time (effective at the end of the month) by phone call to the office staff. 6. The patient will be responsible for cost sharing (co-pay) of up to 20% of the service fee (after annual deductible is met). Patient agreed to services and consent obtained.  Patient Care Team: Glendale Chard, MD as PCP - General (Internal Medicine) Nahser, Wonda Cheng, MD as PCP - Cardiology (Cardiology)  Recent office visits: ***  Recent consult visits: [facility] visits: {Hospital DC Yes/No:25215}   Objective:  Lab Results  Component Value Date   CREATININE 1.01 (H) 07/29/2022   BUN 12 07/29/2022   EGFR 58 (L) 07/29/2022   GFRNONAA 55 (L) 06/23/2022   GFRAA 44 (L) 12/19/2020   NA 141 07/29/2022   K 4.0 07/29/2022   CALCIUM 8.5 (L) 07/29/2022   CO2 22 07/29/2022   GLUCOSE 95 07/29/2022    Lab Results  Component Value Date/Time   HGBA1C 5.1 07/29/2022 03:46 PM   HGBA1C 5.5 12/31/2021 11:44 AM   MICROALBUR 150 12/30/2020 01:02 PM   MICROALBUR 80 10/31/2019 12:13 PM    Last diabetic Eye exam:  Lab Results  Component Value Date/Time   HMDIABEYEEXA No Retinopathy 09/18/2021 12:00 AM    Last diabetic Foot exam: No results found for: "HMDIABFOOTEX"   Lab Results  Component Value Date   CHOL 126 12/31/2021   HDL 57 12/31/2021   LDLCALC 60 12/31/2021   TRIG 30 12/31/2021   CHOLHDL 2.2 12/31/2021       Latest Ref Rng & Units 06/23/2022    7:35 AM 05/13/2022    3:19 PM 01/20/2022  8:17 AM  Hepatic Function  Total Protein 6.5 - 8.1 g/dL 6.4  6.1  6.5   Albumin 3.5 - 5.0 g/dL 3.7   3.6   AST 15 - 41 U/L _0 ALT 0 - 44 U/L _1 Alk Phosphatase 38 - 126 U/L 53   45   Total Bilirubin 0.3 - 1.2 mg/dL 0.5  0.3  0.5     Lab Results  Component Value Date/Time   TSH 1.320 12/31/2021 11:44 AM   TSH 1.020 06/25/2021 12:17 PM   FREET4 1.44 12/31/2021 11:44 AM   FREET4 1.43 06/25/2021 12:17 PM       Latest Ref Rng & Units 06/23/2022    7:35 AM  05/13/2022    3:19 PM 02/03/2022    3:32 AM  CBC  WBC 4.0 - 10.5 K/uL 5.0  4.9  7.9   Hemoglobin 12.0 - 15.0 g/dL 9.0  9.2  9.2   Hematocrit 36.0 - 46.0 % 27.8  28.4  28.4   Platelets 150 - 400 K/uL 160  163  132     Lab Results  Component Value Date/Time   VD25OH 49 05/13/2022 03:19 PM   VD25OH 42 11/24/2021 10:29 AM   VITAMINB12 1,907 (H) 04/16/2021 07:29 AM   VITAMINB12 257 12/30/2020 12:54 PM    Clinical ASCVD: No  The ASCVD Risk score (Arnett DK, et al., 2019) failed to calculate for the following reasons:   The valid total cholesterol range is 130 to 320 mg/dL       07/29/2022    2:49 PM 06/27/2022    9:48 AM 06/25/2021   11:18 AM  Depression screen PHQ 2/9  Decreased Interest 0 0 0  Down, Depressed, Hopeless 0 0 0  PHQ - 2 Score 0 0 0     Social History   Tobacco Use  Smoking Status Never   Passive exposure: Never  Smokeless Tobacco Never   BP Readings from Last 3 Encounters:  07/29/22 130/70  07/25/22 (!) 138/54  07/18/22 (!) 143/74   Pulse Readings from Last 3 Encounters:  07/29/22 80  07/25/22 (!) 59  07/18/22 65   Wt Readings from Last 3 Encounters:  07/29/22 165 lb 12.8 oz (75.2 kg)  07/25/22 168 lb 4.8 oz (76.3 kg)  07/08/22 168 lb 3.2 oz (76.3 kg)   BMI Readings from Last 3 Encounters:  07/29/22 33.49 kg/m  07/25/22 33.99 kg/m  07/08/22 33.97 kg/m    Assessment/Interventions: Review of patient past medical history, allergies, medications, health status, including review of consultants reports, laboratory and other test data, was performed as part of comprehensive evaluation and provision of chronic care management services.   SDOH:  (Social Determinants of Health) assessments and interventions performed: No SDOH Interventions    Flowsheet Row Chronic Care Management from 03/10/2022 in [facility] Visit from 06/25/2021 in [facility] Chronic Care Management from 05/23/2020 in [facility] Chronic Care Management from 04/25/2020 in [facility] Chronic Care Management from 04/22/2020 in [facility] Chronic Care Management from 02/29/2020 in [facility]  SDOH Interventions        Housing Interventions -- Intervention Not Indicated -- -- -- --  Financial Strain Interventions -- -- Other (Comment)  [Assist with patient assistance application appeal for Synthroid] Other (Comment)  [Assisted patient with completing Synthroid patient assistance application] Other (Comment)  [Will assist  patient with completing patient assistance application for Synthroid] --  [Will review patient assistance options for Synthroid]  Physical Activity Interventions --  [Physical Therapy for knee replacement surgery] -- -- -- -- --  Social Connections Interventions -- Intervention Not Indicated -- -- -- --      SDOH Screenings   Food Insecurity: No Food Insecurity (06/27/2022)  Housing: Low Risk  (06/27/2022)  Transportation Needs: No Transportation Needs (06/27/2022)  Utilities: Not At Risk (06/27/2022)  Alcohol Screen: Low Risk  (06/27/2022)  Depression (PHQ2-9): Low Risk  (07/29/2022)  Financial Resource Strain: Low Risk  (06/27/2022)  Physical Activity: Sufficiently Active (06/27/2022)  Social Connections: Moderately Integrated (06/27/2022)  Stress: No Stress Concern Present (06/27/2022)  Tobacco Use: Low Risk  (07/29/2022)    CCM Care Plan  Allergies  Allergen Reactions   Shellfish Allergy Anaphylaxis and Hives    Medications Reviewed Today     Reviewed by Glendale Chard, MD (Physician) on 07/29/22 at 1523  Med List Status: <None>   Medication Order Taking? Sig Documenting Provider Last Dose Status Informant  Accu-Chek Softclix Lancets lancets [id number] Yes Use to check blood sugars daily E11.69 Glendale Chard, MD Taking Active Self  albuterol (PROVENTIL HFA;VENTOLIN HFA) 108 (90 Base) MCG/ACT inhaler [id number] Yes  Inhale 2 puffs into the lungs every 6 (six) hours as needed for wheezing or shortness of breath. Minette Brine, FNP Taking Active Self  Alcohol Swabs (ALCOHOL PADS) 70 % PADS [id number] Yes Use as directed to check blood sugars 1 time per day dx: e11.22 Glendale Chard, MD Taking Active Self  allopurinol (ZYLOPRIM) 100 MG tablet [id number] Yes TAKE 1 TABLET BY MOUTH EVERY DAY FOR GOUT Glendale Chard, MD Taking Active   amLODipine (NORVASC) 2.5 MG tablet [id number] Yes TAKE 1 TABLET(2.5 MG) BY MOUTH DAILY Glendale Chard, MD Taking Active Self  apixaban (ELIQUIS) 5 MG TABS tablet [id number] Yes Take 1 tablet (5 mg total) by mouth 2 (two) times daily. Nahser, Wonda Cheng, MD Taking Active Self  Ascorbic Acid (VITAMIN C) 1000 MG tablet [id number] Yes Take 1,000 mg by mouth daily. [provider] Taking Active Self  atorvastatin (LIPITOR) 80 MG tablet [id number] Yes TAKE 1 TABLET BY MOUTH EVERY DAY Glendale Chard, MD Taking Active   budesonide-formoterol [facility]) 160-4.5 MCG/ACT inhaler [id number] Yes INHALE 2 PUFFS BY MOUTH TWICE DAILY IN THE MORNING AND IN THE EVENING  Patient taking differently: Inhale 2 puffs into the lungs daily as needed (shortness of breath or wheezing).   Glendale Chard, MD Taking Active Self  Calcium Carb-Cholecalciferol 600-800 MG-UNIT TABS [id number] Yes Take 1 tablet by mouth daily.  [provider] Taking Active Self  Cholecalciferol (VITAMIN D PO) [id number] Yes Take 2,000 Units by mouth daily. [provider] Taking Active Self  cyanocobalamin 1000 MCG tablet [id number] Yes Take 1,000 mcg by mouth daily. [provider] Taking Active Self           Med Note Kellie Simmering, Jenene Slicker   Tue Jun 23, 2022  1:02 PM)    denosumab (PROLIA) 60 MG/ML SOSY injection [id number] Yes Inject 60 mg into the skin every 6 (six) months. Courier to rheum: [address] [place], [place] [address]. Appt on 06/09/22 Ofilia Neas, PA-C Taking Active Self  glucose blood  (ACCU-CHEK GUIDE) test strip [id number] Yes Use to check blood sugars daily E11.69 Glendale Chard, MD Taking Active Self  hydroxychloroquine (PLAQUENIL) 200 MG tablet [id number] Yes TAKE 1 TABLET(200 MG) BY MOUTH DAILY Ofilia Neas, PA-C Taking  Active Self  Hypromellose (ARTIFICIAL TEARS OP) [id number] Yes Apply 1 drop to eye daily as needed (dry eyes). [provider] Taking Active Self  irbesartan (AVAPRO) 300 MG tablet [id number] Yes TAKE 1 TABLET(300 MG) BY MOUTH DAILY Glendale Chard, MD Taking Active Self  iron polysaccharides (NIFEREX) 150 MG capsule [id number] Yes Take 150 mg by mouth daily. [provider] Taking Active Self           Med Note (LITTLE, ANGEL L   Fri Nov 01, 2020  1:05 PM)    methocarbamol (ROBAXIN) 500 MG tablet [id number] Yes Take 1 tablet (500 mg total) by mouth every 6 (six) hours as needed for muscle spasms. Jearld Lesch, PA Taking Active   metoprolol succinate (TOPROL-XL) 25 MG 24 hr tablet [id number] Yes TAKE 1 TABLET(25 MG) BY MOUTH DAILY Nahser, Wonda Cheng, MD Taking Active   oxyCODONE (OXY IR/ROXICODONE) 5 MG immediate release tablet [id number] Yes Take 1-2 tablets (5-10 mg total) by mouth every 6 (six) hours as needed for severe pain. Jearld Lesch, PA Taking Active   SYNTHROID 88 MCG tablet [id number] Yes TAKE 1 TABLET BY MOUTH EVERY DAY MONDAY TO SATURDAY AND OFF ON Nemiah Commander, MD Taking Active   traMADol (ULTRAM) 50 MG tablet [id number] Yes Take 1-2 tablets (50-100 mg total) by mouth every 6 (six) hours as needed for moderate pain. Jearld Lesch, PA Taking Active   valsartan-hydrochlorothiazide (DIOVAN-HCT) 160-25 MG tablet [id number] Yes Take 1 tablet by mouth daily. 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[place]) 07/06/2022   OA (osteoarthritis) of knee 02/02/2022   Primary osteoarthritis of left knee 02/02/2022    Chronic bilateral low back pain without sciatica 06/25/2021   Paresthesia and pain of extremity 06/25/2021   Atrial fibrillation ([place]) 04/16/2021   Demand ischemia 03/26/2021   Unspecified atrial fibrillation ([place]) 03/26/2021   COVID-19 virus infection 03/26/2021   PVC (premature ventricular contraction) 05/06/2020   Type 2 diabetes mellitus with stage 3 chronic kidney disease, without long-term current use of insulin ([place]) 03/20/2020   Palpitations 03/20/2020   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/2020   Primary hypothyroidism 03/20/2020   Vitiligo 03/20/2020   Iron deficiency anemia 08/12/2017   Osteoporosis 10/02/2016   Systemic lupus erythematosus ([place]) 10/02/2016   Rheumatoid arthritis involving multiple sites with positive rheumatoid factor ([place]) 10/02/2016   High risk medication use 10/02/2016   History of chronic kidney disease 10/02/2016   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/2016   Primary osteoarthritis of both knees 10/02/2016   Vitamin D deficiency 10/02/2016   History of diabetes mellitus 10/02/2016   History of hypertension 10/02/2016   History of asthma 10/02/2016   Absolute anemia    MGUS (monoclonal gammopathy of unknown significance)    Normocytic anemia 03/21/2015   Essential hypertension 03/20/2015   Abnormal CT of the chest 07/30/2012   Asthma 06/19/2012    Immunization History  Administered Date(s) Administered   19-influenza Whole 07/15/2012   DTaP 06/18/2019   Fluad Quad(high Dose 65+) 06/19/2020, 06/03/2021   Influenza Split 06/28/2014   Influenza, High Dose Seasonal PF 05/18/2019, 06/04/2022   Influenza,inj,Quad PF,6+ Mos 06/20/2018   Moderna SARS-COV2 Booster Vaccination 01/01/2021   Moderna Sars-Covid-2 Vaccination 11/20/2019, 12/19/2019, 08/06/2020, 01/01/2021, 08/13/2021   Pfizer Covid-19 Vaccine Bivalent Booster 37yr & up 07/27/2022   Pneumococcal Conjugate-13 05/04/2018   Pneumococcal  Polysaccharide-23 05/18/2019    Pneumococcal-Unspecified 06/28/2014   Tdap 06/15/2019   Zoster Recombinat (Shingrix) 02/19/2021, 04/24/2021    Conditions to be addressed/monitored:  Hypertension, Asthma, and Iron Deficiency Anemia   There are no care plans that you recently modified to display for this patient.    Medication Assistance:  Synthroid obtained through [facility] medication assistance program.  Enrollment ends 08/2022 Symbicort obtained through [employer] medication assistance program.  Enrollment ends 08/2022  Compliance/Adherence/Medication fill history: Care Gaps: Patient reports eye exam on 10/06/2022 with Dr. Katy Fitch DEXA scheduled for 09/04/2023 Yearly OB-GYN on 11/26/2022  Star-Rating Drugs: Irbesartan 300 mg tablet  Atorvastatin 80 mg tablet   Patient's preferred pharmacy is:  [id number] [place], [place] - [address] AT [place] [address] Phone: [phone] Fax: [place] [address] Phone: [phone] Fax: [phone]  [facility] [place] [id number] [place], [place] [address] Phone: [phone] Fax: [phone]  [place], [place] [address] Phone: [phone] Fax: [place], [place] [address] [place] [address] Phone: [phone] Fax: [phone]  Uses pill box? Yes Pt endorses 90% compliance  We discussed: Benefits of medication synchronization, packaging and delivery as well as enhanced pharmacist oversight with [employer]. Patient decided to: Continue current medication management strategy  Care Plan and Follow Up Patient Decision:  Patient agrees to Care Plan and Follow-up.  Plan: The patient has been provided with contact  information for the care management team and has been advised to call with any health related questions or concerns.   [name], CPP, PharmD Clinical Pharmacist Practitioner [facility] [phone]

## 2022-09-03 DIAGNOSIS — M8589 Other specified disorders of bone density and structure, multiple sites: Secondary | ICD-10-CM | POA: Diagnosis not present

## 2022-09-03 DIAGNOSIS — Z780 Asymptomatic menopausal state: Secondary | ICD-10-CM | POA: Diagnosis not present

## 2022-09-03 LAB — HM DEXA SCAN

## 2022-09-04 ENCOUNTER — Other Ambulatory Visit: Payer: Self-pay | Admitting: Internal Medicine

## 2022-09-04 ENCOUNTER — Ambulatory Visit: Payer: Self-pay

## 2022-09-04 DIAGNOSIS — E1122 Type 2 diabetes mellitus with diabetic chronic kidney disease: Secondary | ICD-10-CM

## 2022-09-04 DIAGNOSIS — I1310 Hypertensive heart and chronic kidney disease without heart failure, with stage 1 through stage 4 chronic kidney disease, or unspecified chronic kidney disease: Secondary | ICD-10-CM

## 2022-09-04 NOTE — Patient Instructions (Signed)
Visit Information  Thank you for taking time to visit with me today. Please don't hesitate to contact me if I can be of assistance to you.   Following are the goals we discussed today:   Goals Addressed               This Visit's Progress     Patient Stated     I hope to acheive better range of motion in my left knee (pt-stated)        Care Coordination Interventions: Evaluation of current treatment plan related to osteoarthritis of left knee and status post left knee arthroplasty  and patient's adherence to plan as established by provider Determined patient has made a complete recovery from left knee arthroplasty Discussed patient will continue adhering to her prescribed HEP as directed by Dr. Maureen Ralphs Educated patient regarding the PREP program and patient would like to move forward with the PREP referral  Sent in basket message to PCP Dr. Glendale Chard with request for PREP referral         Our next appointment is by telephone on 11/10/22 at 0900 AM  Please call the care guide team at [phone] if you need to cancel or reschedule your appointment.   If you are experiencing a Mental Health or [place] or need someone to talk to, please call 1-800-273-TALK (toll free, 24 hour hotline)  Patient verbalizes understanding of instructions and care plan provided today and agrees to view in [place]. Active MyChart status and patient understanding of how to access instructions and care plan via MyChart confirmed with patient.     Barb Merino, RN, BSN, CCM Care Management Coordinator [facility] Care Management  Direct Phone: [phone]

## 2022-09-04 NOTE — Patient Outreach (Signed)
  Care Coordination   Follow Up Visit Note   09/04/2022 Name: MOLLYE GUINTA MRN: [id number] DOB: 09/29/1945  [name] is a 76 y.o. year old female who sees Glendale Chard, MD for primary care. I spoke with  [name] by phone today.  What matters to the patients health and wellness today?  Patient would like to participate in the PREP program.     Goals Addressed               This Visit's Progress     Patient Stated     I hope to acheive better range of motion in my left knee (pt-stated)        Care Coordination Interventions: Evaluation of current treatment plan related to osteoarthritis of left knee and status post left knee arthroplasty  and patient's adherence to plan as established by provider Determined patient has made a complete recovery from left knee arthroplasty Discussed patient will continue adhering to her prescribed HEP as directed by Dr. Maureen Ralphs Educated patient regarding the PREP program and patient would like to move forward with the PREP referral  Sent in basket message to PCP Dr. Glendale Chard with request for PREP referral         SDOH assessments and interventions completed:  No     Care Coordination Interventions:  Yes, provided   Follow up plan: Follow up call scheduled for 11/10/22 '@0900'$  AM    Encounter Outcome:  Pt. Visit Completed

## 2022-09-07 ENCOUNTER — Telehealth: Payer: Self-pay | Admitting: *Deleted

## 2022-09-07 NOTE — Telephone Encounter (Signed)
Contacted regarding PREP Class. Left voice message for return call for more information.

## 2022-09-08 ENCOUNTER — Telehealth: Payer: Self-pay | Admitting: *Deleted

## 2022-09-08 MED ORDER — BUDESONIDE-FORMOTEROL FUMARATE 160-4.5 MCG/ACT IN AERO: INHALATION_SPRAY | RESPIRATORY_TRACT | 6 refills | Status: DC

## 2022-09-08 NOTE — Patient Instructions (Addendum)
Visit Information It was great speaking with you today!  Please let me know if you have any questions about our visit.   Goals Addressed             This Visit's Progress    Track and Manage My Blood Pressure-Hypertension       Timeframe:  Long-Range Goal Priority:  Medium Start Date:  01/13/21                          Expected End Date: 12/2022             Follow Up Date: 03/03/2023   In progress:  Self-Care Activities: Self administers medications as prescribed Attends all scheduled provider appointments Calls provider office for new concerns, questions, or BP outside discussed parameters Checks BP and records as discussed Follows a low sodium diet/DASH diet Patient Goals: - check blood pressure 3 times per week - choose a place to take my blood pressure (home, clinic or office, retail store) - write blood pressure results in a log or diary - learn about high blood pressure   Why is this important?   You won't feel high blood pressure, but it can still hurt your blood vessels.  Making lifestyle changes like losing a little weight or eating less salt will help.  Checking your blood pressure at home and at different times of the day can help to control blood pressure.  If the doctor prescribes medicine remember to take it the way the doctor ordered.  Call the office if you cannot afford the medicine or if there are questions about it.             Patient Care Plan: CCM Pharmacy Care Plan     Problem Identified: HTN, DM II,  Asthma   Priority: High     Long-Range Goal: Disease Management   Recent Progress: On track  Priority: High  Note:   Current Barriers:  Unable to independently afford treatment regimen Unable to independently monitor therapeutic efficacy  Pharmacist Clinical Goal(s):  Patient will verbalize ability to afford treatment regimen achieve adherence to monitoring guidelines and medication adherence to achieve therapeutic efficacy through  collaboration with PharmD and provider.   Interventions: 1:1 collaboration with Glendale Chard, MD regarding development and update of comprehensive plan of care as evidenced by provider attestation and co-signature Inter-disciplinary care team collaboration (see longitudinal plan of care) Comprehensive medication review performed; medication list updated in electronic medical record  Hypertension (BP goal <140/90) -Controlled -Current treatment: Amlodipine 2.5 mg tablet once per day Appropriate, Effective, Safe, Accessible Valsartan-Hydrochlorothiazide 160/25 mg tablet once per day  Medication discontinued from patients chart she has not been taking this  Metoprolol succinate 25 mg tablet by mouth daily Appropriate, Effective, Safe, Accessible Irbersartan 300 mg tablet once per day Appropriate, Effective, Safe, Accessible -Current home readings: 137/58, 135/64 -Current exercise habits: patient reports that her MD, she can use an exercise bike three days per week for 15 minutes. She might go back to [employer] prior to January. She is using silver sneakers on Youtube, but mostly doing exercises for her knee that were given to her by the specialist.  -Denies hypotensive/hypertensive symptoms -Educated on BP goals and benefits of medications for prevention of heart attack, stroke and kidney damage; Importance of home blood pressure monitoring; -Counseled to monitor BP at home at least three times per week, document, and provide log at future appointments -Recommended to continue current  medication  Asthma (Goal: control symptoms and prevent exacerbations) -Controlled -Current treatment  Albuterol 108 (90 Base)- Inhale 2 puffs into the lungs every 6 hours as needed Appropriate, Effective, Safe, Accessible Budesonide/Formoterol 160-4.5 mcg act inhaler - inhale 2 puffs by mouth twice daily in the morning and the evening Appropriate, Effective, Safe, Accessible -Pulmonary function testing:  FEV1 1.02 (42%)  -Exacerbations requiring treatment in last 6 months: none -Patient denies consistent use of maintenance inhaler -Frequency of rescue inhaler use: she has not had to use the rescue inhaler  -Counseled on Benefits of consistent maintenance inhaler use -Patients -Recommended to continue current medication Collaborated with patient and gave her the [facility] number for patient to request refills of Sybicort through patient assistance program.  Follow Up from 09/08/2022: Ms. Easterly reports needing a refill of Symbicort for patient assistance, sent prescription electronically to [facility]  Iron Deficiency Anemia (Goal: Ferritin levels >100) -Controlled -Current treatment  Niferex- no longer taking this medication due to Iron infusion treatment  IV injectafer weekly was completed by patient -Dr. Irene Limbo gave Ms. Fifer the option to due iron infusion treatment to replace tablets.  -Patient went three weeks in a row for three hours each week and at this time she no longer needs to iron supplements.  -Ms. Mccook requested Niferex  -Her follow up appointment with Dr. Velvet Bathe is 11/04/2022  -Recommended patient continue to follow up with specialist and discontinue Niferex per specialist follow up   Patient Goals/Self-Care Activities Patient will:  - take medications as prescribed as evidenced by patient report and record review  Follow Up Plan: The patient has been provided with contact information for the care management team and has been advised to call with any health related questions or concerns.       Patient agreed to services and verbal consent obtained.   The patient verbalized understanding of instructions, educational materials, and care plan provided today and agreed to receive a mailed copy of patient instructions, educational materials, and care plan.   [name], PharmD Clinical Pharmacist [facility] [phone]

## 2022-09-08 NOTE — Telephone Encounter (Signed)
Patient returned call regarding PREP referral. She is not sure if she is interested in participating at this time but would like a call back in January or February 2024.

## 2022-09-09 ENCOUNTER — Telehealth: Payer: Self-pay | Admitting: *Deleted

## 2022-09-09 NOTE — Telephone Encounter (Signed)
Received DEXA results from [facility].  Date of Scan: 09/03/2022  Lowest T-score:-2.4  BMD:0.581  Lowest site measured:Right Femoral Neck  DX: Osteopenia  Significant changes in BMD and site measured (5% and above):9% Right Total Femur, 10 % Left Total Femur  Current Regimen:Calcium, Vitamin D and Prolia, Last injection: 06/09/2022  Recommendation:Will discuss at follow up visit  Reviewed by:Dr. Bo Merino  Next Appointment:  10/15/2022

## 2022-09-10 ENCOUNTER — Other Ambulatory Visit: Payer: Self-pay | Admitting: Physician Assistant

## 2022-09-10 ENCOUNTER — Encounter: Payer: Self-pay | Admitting: *Deleted

## 2022-09-10 DIAGNOSIS — M0579 Rheumatoid arthritis with rheumatoid factor of multiple sites without organ or systems involvement: Secondary | ICD-10-CM

## 2022-09-10 NOTE — Telephone Encounter (Signed)
Sent message to advise patient she is due to update PLQ eye exam.

## 2022-09-10 NOTE — Telephone Encounter (Signed)
Next Visit: 10/15/2022   Last Visit: 05/13/2022   Labs: 06/23/2022 Creat. 1.05, Total Protein 6.4, GFR 55   Eye exam: 09/18/2021 WNL    Current Dose per office note 05/13/2022: hydroxychloroquine 200 mg p.o. 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

## 2022-09-15 ENCOUNTER — Ambulatory Visit: Payer: Medicare HMO | Admitting: Cardiovascular Disease

## 2022-09-16 NOTE — Progress Notes (Signed)
Cardiology Office Note:    Date:  09/18/2022   ID:  Hannah [name], Hannah [name] 1946-01-17, MRN [id number]  PCP:  Glendale Chard, MD  [facility] Cardiologist:  New to [place] Electrophysiologist:  None   Referring MD: Glendale Chard, MD   Chief Complaint  Patient presents with   Hypertension   Atrial Fibrillation    History of Present Illness:    Hannah [name] is a 76 y.o. female with a hx of palpitations.   We are asked to see her by Dr. Baird Cancer for further evaluation of these palpitations.   Hx of hyperlipidemia, hy[pothyroidism, HTN    These palpitations occur randomly.  Typically at night when she is trying to go to sleep Occurs rarely.  started 4 months ago.   Last episode  Was a month ago  Typically at night when she is in bed.   No dizziness,  No sweats.  Single isolated HR irreg.  Exercises 4 times a week.   Not limited by palpitations  ECGs have been normal  TSH is normal and has been stable   Has had both covid vaccines Sees hematology for mild anemia    April 16, 2021: Hannah [name] is seen today for follow up of her palpitations.  She was found to have PAF during a COVID infection   ( See Media - March 26, 2021, Photos)  She was started on Eliquis 5 m gBID and  toprol XL 25 mg a day   CHADS2VASC is 35 ( age 36, female, HTN, DM)  She needs to have a knee replacement at some point She is at low risk for her upcoming knee surgery   Jan. 16, 2023: Hannah [name] is seen today for follow up of her PAF ( occurred during a COVID infection) She is on eliquis CHADS2VASC is 5   BP has been ok at home  No palpitations to suggest   Dec. 21, 2023  Hannah [name] is seen for HTN, palpitations, PAF  Had a left knee replacement in May  Doing well Is starting back with her exercise ,  was in physical therapy until 2 weeks No CP , no dyspnea, no palpitations to suggest PAF  She has a soft systolic murmur on exam. Echocardiogram from March 27, 2021 reveals  normal left ventricular systolic function with EF of 55 to 60%.  She has grade 1 diastolic dysfunction.  Right ventricle size and function are normal.  There is mild calcification of the aortic valve.  There is no evidence of aortic stenosis.  There is trivial mitral regurgitation.  Trivial tricuspid valve regurgitation.   She is planning on possibly having her right knee replacement this year.  She is doing well.  She is at low risk from a cardiac standpoint to get her knee replaced.  She will be able to hold her Eliquis for 2 to 3 days prior to her surgery.  Past Medical History:  Diagnosis Date   Anemia    Arthritis    Asthma    Atrial fibrillation ([place])    Chronic kidney disease    Diabetes mellitus without complication ([place])    Gout 12/17/2014   patient reported   Hyperlipidemia    Hypertension    Hypothyroidism    Systemic lupus erythematosus ([place])    Vitiligo     Past Surgical History:  Procedure Laterality Date   CATARACT EXTRACTION Bilateral 2015   DILATION AND CURETTAGE OF UTERUS     DOPPLER ECHOCARDIOGRAPHY  05/2018   Internist to review with pt; potential heart murmur 06/20/18   keratosis removal  2021   KNEE CLOSED REDUCTION Left 07/06/2022   Procedure: CLOSED MANIPULATION KNEE;  Surgeon: Gaynelle Arabian, MD;  Location: [facility];  Service: Orthopedics;  Laterality: Left;   SKIN SURGERY  11/30/2018   left side of face   TOOTH EXTRACTION     TOTAL KNEE ARTHROPLASTY Left 02/02/2022   Procedure: TOTAL KNEE ARTHROPLASTY;  Surgeon: Gaynelle Arabian, MD;  Location: [facility];  Service: Orthopedics;  Laterality: Left;    Current Medications: Current Meds  Medication Sig   Accu-Chek Softclix Lancets lancets Use to check blood sugars daily E11.69   albuterol (PROVENTIL HFA;VENTOLIN HFA) 108 (90 Base) MCG/ACT inhaler Inhale 2 puffs into the lungs every 6 (six) hours as needed for wheezing or shortness of breath.   Alcohol Swabs (ALCOHOL PADS) 70 % PADS Use as directed to check  blood sugars 1 time per day dx: e11.22   allopurinol (ZYLOPRIM) 100 MG tablet Take 1 tablet (100 mg total) by mouth 3 (three) times a week for 90 doses.   amLODipine (NORVASC) 2.5 MG tablet TAKE 1 TABLET(2.5 MG) BY MOUTH DAILY   apixaban (ELIQUIS) 5 MG TABS tablet Take 1 tablet (5 mg total) by mouth 2 (two) times daily.   Ascorbic Acid (VITAMIN C) 1000 MG tablet Take 1,000 mg by mouth daily.   atorvastatin (LIPITOR) 80 MG tablet TAKE 1 TABLET BY MOUTH EVERY DAY   budesonide-formoterol (SYMBICORT) 160-4.5 MCG/ACT inhaler INHALE 2 PUFFS BY MOUTH TWICE DAILY IN THE MORNING AND IN THE EVENING   Calcium Carb-Cholecalciferol 600-800 MG-UNIT TABS Take 1 tablet by mouth daily.    Cholecalciferol (VITAMIN D PO) Take 2,000 Units by mouth daily.   cyanocobalamin 1000 MCG tablet Take 1,000 mcg by mouth daily.   denosumab (PROLIA) 60 MG/ML SOSY injection Inject 60 mg into the skin every 6 (six) months. Courier to rheum: [address], [place], [place] [address]. Appt on 06/09/22   glucose blood (ACCU-CHEK GUIDE) test strip Use to check blood sugars daily E11.69   hydroxychloroquine (PLAQUENIL) 200 MG tablet TAKE 1 TABLET(200 MG) BY MOUTH DAILY   Hypromellose (ARTIFICIAL TEARS OP) Apply 1 drop to eye daily as needed (dry eyes).   irbesartan (AVAPRO) 300 MG tablet TAKE 1 TABLET(300 MG) BY MOUTH DAILY   iron polysaccharides (NIFEREX) 150 MG capsule Take 150 mg by mouth daily.   metoprolol succinate (TOPROL-XL) 25 MG 24 hr tablet TAKE 1 TABLET(25 MG) BY MOUTH DAILY   SYNTHROID 88 MCG tablet TAKE 1 TABLET BY MOUTH EVERY DAY MONDAY TO SATURDAY AND OFF ON SUNDAYS     Allergies:   Shellfish allergy   Social History   Socioeconomic History   Marital status: Single    Spouse name: Not on file   Number of children: 0   Years of education: Not on file   Highest education level: Not on file  Occupational History   Occupation: retired  Tobacco Use   Smoking status: Never    Passive exposure: Never    Smokeless tobacco: Never  Vaping Use   Vaping Use: Never used  Substance and Sexual Activity   Alcohol use: Not Currently   Drug use: No   Sexual activity: Not Currently    Birth control/protection: Post-menopausal    Comment: 1ST intercourse- 64 , partners- 59,   Other Topics Concern   Not on file  Social History Narrative   Not on file  Social Determinants of Health   Financial Resource Strain: Low Risk  (06/27/2022)   Overall Financial Resource Strain (CARDIA)    Difficulty of Paying Living Expenses: Not hard at all  Food Insecurity: No Food Insecurity (06/27/2022)   Hunger Vital Sign    Worried About Running Out of Food in the Last Year: Never true    Ran Out of Food in the Last Year: Never true  Transportation Needs: No Transportation Needs (06/27/2022)   PRAPARE - Hydrologist (Medical): No    Lack of Transportation (Non-Medical): No  Physical Activity: Sufficiently Active (06/27/2022)   Exercise Vital Sign    Days of Exercise per Week: 3 days    Minutes of Exercise per Session: 60 min  Stress: No Stress Concern Present (06/27/2022)   [place]    Feeling of Stress : Not at all  Social Connections: Moderately Integrated (06/27/2022)   Social Connection and Isolation Panel [NHANES]    Frequency of Communication with Friends and Family: More than three times a week    Frequency of Social Gatherings with Friends and Family: More than three times a week    Attends Religious Services: More than 4 times per year    Active Member of [employer] or Organizations: Yes    Attends Music therapist: More than 4 times per year    Marital Status: Never married     Family History: The patient's family history includes Diabetes in her father; Heart disease in her father; Hypertension in her mother, sister, and sister; Leukemia in her sister.  ROS:   Please see the history of present  illness.     All other systems reviewed and are negative.  EKGs/Labs/Other Studies Reviewed:    The following studies were reviewed today:   EKG:    Dec. 21, 2023.   NSR at 60 , minimal voltage criteria for left ventricular hypertrophy.  No acute changes.   Recent Labs: 12/31/2021: TSH 1.320 06/23/2022: ALT 14; Hemoglobin 9.0; Platelet Count 160 07/29/2022: BUN 12; Creatinine, Ser 1.01; Potassium 4.0; Sodium 141  Recent Lipid Panel    Component Value Date/Time   CHOL 126 12/31/2021 1144   TRIG 30 12/31/2021 1144   HDL 57 12/31/2021 1144   CHOLHDL 2.2 12/31/2021 1144   LDLCALC 60 12/31/2021 1144    Physical Exam:    Physical Exam: Blood pressure 124/68, pulse 65, height '4\' 11"'$  (1.499 m), weight 170 lb (77.1 kg), SpO2 94 %.     GEN:  Well nourished, well developed in no acute distress HEENT: Normal NECK: No JVD; No carotid bruits LYMPHATICS: No lymphadenopathy CARDIAC: RRR , no murmurs, rubs, gallops RESPIRATORY:  Clear to auscultation without rales, wheezing or rhonchi  ABDOMEN: Soft, non-tender, non-distended MUSCULOSKELETAL:  No edema; No deformity  SKIN: Warm and dry NEUROLOGIC:  Alert and oriented x 3   ASSESSMENT:    1. Paroxysmal atrial fibrillation (HCC)   2. Essential hypertension     PLAN:     PAF : She was found to have an episode of paroxysmal atrial fibrillation when she had a COVID infection.   CHADS2VASC is 79 ( age 61, female, HTN, DM)  Continue Eliquis.  Continue metoprolol.  She may need to have right knee surgery this year.  She had her left knee replaced last year without any complications.  If she needs surgery she will be at low risk from a cardiovascular standpoint.  It would be okay for her to hold her Eliquis for 2 to 3 days prior to the knee replacement.    HTN:   Her blood pressure is well-controlled.  Continue current medications.    Medication Adjustments/Labs and Tests Ordered: Current medicines are reviewed at length with the  patient today.  Concerns regarding medicines are outlined above.  Orders Placed This Encounter  Procedures   EKG 12-Lead    No orders of the defined types were placed in this encounter.     Patient Instructions  Medication Instructions:  Your physician recommends that you continue on your current medications as directed. Please refer to the Current Medication list given to you today.  *If you need a refill on your cardiac medications before your next appointment, please call your pharmacy*   Lab Work: NONE If you have labs (blood work) drawn today and your tests are completely normal, you will receive your results only by: [place] (if you have MyChart) OR A paper copy in the mail If you have any lab test that is abnormal or we need to change your treatment, we will call you to review the results.   Testing/Procedures: NONE   Follow-Up: At [facility], you and your health needs are our priority.  As part of our continuing mission to provide you with exceptional heart care, we have created designated Provider Care Teams.  These Care Teams include your primary Cardiologist (physician) and Advanced Practice Providers (APPs -  Physician Assistants and Nurse Practitioners) who all work together to provide you with the care you need, when you need it.  We recommend signing up for the patient portal called "MyChart".  Sign up information is provided on this After Visit Summary.  MyChart is used to connect with patients for Virtual Visits (Telemedicine).  Patients are able to view lab/test results, encounter notes, upcoming appointments, etc.  Non-urgent messages can be sent to your provider as well.   To learn more about what you can do with MyChart, go to [email or web address].    Your next appointment:   1 year(s)  The format for your next appointment:   In Person  Provider:   Mertie Moores, MD       Important Information About Sugar          Signed, Mertie Moores, MD  09/18/2022 6:19 AM    [place]

## 2022-09-17 ENCOUNTER — Ambulatory Visit: Payer: Medicare HMO | Attending: Cardiovascular Disease | Admitting: Cardiovascular Disease

## 2022-09-17 ENCOUNTER — Encounter: Payer: Self-pay | Admitting: Cardiovascular Disease

## 2022-09-17 VITALS — BP 124/68 | HR 65 | Ht 59.0 in | Wt 170.0 lb

## 2022-09-17 DIAGNOSIS — I480 Paroxysmal atrial fibrillation: Secondary | ICD-10-CM

## 2022-09-17 DIAGNOSIS — I10 Essential (primary) hypertension: Secondary | ICD-10-CM | POA: Diagnosis not present

## 2022-09-17 NOTE — Patient Instructions (Signed)
Medication Instructions:  Your physician recommends that you continue on your current medications as directed. Please refer to the Current Medication list given to you today.  *If you need a refill on your cardiac medications before your next appointment, please call your pharmacy*   Lab Work: NONE If you have labs (blood work) drawn today and your tests are completely normal, you will receive your results only by: MyChart Message (if you have MyChart) OR A paper copy in the mail If you have any lab test that is abnormal or we need to change your treatment, we will call you to review the results.   Testing/Procedures: NONE   Follow-Up: At [facility], you and your health needs are our priority.  As part of our continuing mission to provide you with exceptional heart care, we have created designated Provider Care Teams.  These Care Teams include your primary Cardiologist (physician) and Advanced Practice Providers (APPs -  Physician Assistants and Nurse Practitioners) who all work together to provide you with the care you need, when you need it.  We recommend signing up for the patient portal called "MyChart".  Sign up information is provided on this After Visit Summary.  MyChart is used to connect with patients for Virtual Visits (Telemedicine).  Patients are able to view lab/test results, encounter notes, upcoming appointments, etc.  Non-urgent messages can be sent to your provider as well.   To learn more about what you can do with MyChart, go to [email or web address].    Your next appointment:   1 year(s)  The format for your next appointment:   In Person  Provider:   Philip Nahser, MD       Important Information About Sugar       

## 2022-09-18 ENCOUNTER — Encounter: Payer: Self-pay | Admitting: Cardiovascular Disease

## 2022-09-27 DIAGNOSIS — I10 Essential (primary) hypertension: Secondary | ICD-10-CM | POA: Diagnosis not present

## 2022-09-27 DIAGNOSIS — D509 Iron deficiency anemia, unspecified: Secondary | ICD-10-CM

## 2022-09-27 DIAGNOSIS — J45909 Unspecified asthma, uncomplicated: Secondary | ICD-10-CM | POA: Diagnosis not present

## 2022-09-30 ENCOUNTER — Encounter: Payer: Self-pay | Admitting: Internal Medicine

## 2022-10-02 ENCOUNTER — Telehealth: Payer: Self-pay

## 2022-10-02 NOTE — Telephone Encounter (Signed)
Called to discuss PREP program schedule at [facility]; she was on the way to work out there with her sister; classes available on 10/12/22 and 10/26/22; asked her to pick up a PREP flyer when she was at [place] [facility] and call me back to let me know if she wants to participate and which class she wants to attend.

## 2022-10-02 NOTE — Progress Notes (Signed)
Office Visit Note  Patient: Hannah [name]             Date of Birth: Apr 05, 1946           MRN: [id number]             PCP: Glendale Chard, MD Referring: Glendale Chard, MD Visit Date: 10/15/2022 Occupation: '@GUAROCC'$ @  Subjective:  Discuss DEXA results   History of Present Illness: Hannah [name] is a 77 y.o. female with history of systemic lupus, rheumatoid arthritis, gout, and osteoporosis.  She is taking plaquenil 200 mg 1 tablet by mouth daily.  She is tolerating Plaquenil without any side effects and has not missed any doses recently.  Patient reports that she has been experiencing increased pain in the left hand for the past 1 month.  She describes the pain as fleeting and intermittent shooting pains especially in the left second and third MCP joints.  She denies any joint swelling.  She states that her left knee replacement is doing well and she is currently scheduled for a 1 year follow-up in May 2024.  Is completed physical therapy.  She states that she has intermittent pain and stiffness in the right knee joint and is considering a knee replacement in 2025. She denies any signs or symptoms of a systemic lupus flare. She denies any recent gout flares. Patient reports her last dose of Prolia was on 06/09/2022.  She would like to review her DEXA results today.     Activities of Daily Living:  Patient reports morning stiffness for 15 minutes.   Patient Denies nocturnal pain.  Difficulty dressing/grooming: Denies Difficulty climbing stairs: Reports Difficulty getting out of chair: Denies Difficulty using hands for taps, buttons, cutlery, and/or writing: Denies  Review of Systems  Constitutional:  Positive for fatigue.  HENT:  Negative for mouth sores and mouth dryness.   Eyes:  Positive for dryness.  Respiratory:  Negative for shortness of breath.   Cardiovascular:  Negative for chest pain and palpitations.  Gastrointestinal:  Negative for blood in stool,  constipation and diarrhea.  Endocrine: Positive for increased urination.  Genitourinary:  Negative for involuntary urination.  Musculoskeletal:  Positive for joint pain, gait problem, joint pain and morning stiffness. Negative for joint swelling, myalgias, muscle weakness, muscle tenderness and myalgias.  Skin:  Negative for color change, rash, hair loss and sensitivity to sunlight.  Allergic/Immunologic: Negative for susceptible to infections.  Neurological:  Negative for dizziness and headaches.  Hematological:  Negative for swollen glands.  Psychiatric/Behavioral:  Positive for sleep disturbance. Negative for depressed mood. The patient is not nervous/anxious.     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[place]) 07/06/2022   OA (osteoarthritis) of knee 02/02/2022   Primary osteoarthritis of left knee 02/02/2022   Chronic bilateral low back pain without sciatica 06/25/2021   Paresthesia and pain of extremity 06/25/2021   Atrial fibrillation ([place]) 04/16/2021   Demand ischemia 03/26/2021   Unspecified atrial fibrillation ([place]) 03/26/2021   COVID-19 virus infection 03/26/2021   PVC (premature ventricular contraction) 05/06/2020   Type 2 diabetes mellitus with stage 3 chronic kidney disease, without long-term current use of insulin ([place]) 03/20/2020   Palpitations 03/20/2020   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/2020   Primary hypothyroidism 03/20/2020   Vitiligo 03/20/2020   Iron deficiency anemia 08/12/2017   Osteoporosis 10/02/2016   Systemic lupus erythematosus ([place]) 10/02/2016   Rheumatoid arthritis involving multiple sites with positive  rheumatoid factor ([place]) 10/02/2016   High risk medication use 10/02/2016   History of chronic kidney disease 10/02/2016   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/2016   Primary osteoarthritis of both knees 10/02/2016   Vitamin D deficiency 10/02/2016   History  of diabetes mellitus 10/02/2016   History of hypertension 10/02/2016   History of asthma 10/02/2016   Absolute anemia    MGUS (monoclonal gammopathy of unknown significance)    Normocytic anemia 03/21/2015   Essential hypertension 03/20/2015   Abnormal CT of the chest 07/30/2012   Asthma 06/19/2012    Past Medical History:  Diagnosis Date   Anemia    Arthritis    Asthma    Atrial fibrillation ([place])    Chronic kidney disease    Diabetes mellitus without complication ([place])    Gout 12/17/2014   patient reported   Hyperlipidemia    Hypertension    Hypothyroidism    Systemic lupus erythematosus ([place])    Vitiligo     Family History  Problem Relation Age of Onset   Heart disease Father    Diabetes Father    Hypertension Mother    Hypertension Sister    Hypertension Sister    Leukemia Sister    Past Surgical History:  Procedure Laterality Date   CATARACT EXTRACTION Bilateral 2015   DILATION AND CURETTAGE OF UTERUS     DOPPLER ECHOCARDIOGRAPHY  05/2018   Internist to review with pt; potential heart murmur 06/20/18   keratosis removal  2021   KNEE CLOSED REDUCTION Left 07/06/2022   Procedure: CLOSED MANIPULATION KNEE;  Surgeon: Gaynelle Arabian, MD;  Location: [facility];  Service: Orthopedics;  Laterality: Left;   REPLACEMENT TOTAL KNEE Left    SKIN SURGERY  11/30/2018   left side of face   TOOTH EXTRACTION     TOTAL KNEE ARTHROPLASTY Left 02/02/2022   Procedure: TOTAL KNEE ARTHROPLASTY;  Surgeon: Gaynelle Arabian, MD;  Location: [facility];  Service: Orthopedics;  Laterality: Left;   Social History   Social History Narrative   Not on file   Immunization History  Administered Date(s) Administered   19-influenza Whole 07/15/2012   Covid-19, Mrna,Vaccine(Spikevax)49yr and older 07/27/2022   DTaP 06/18/2019   Fluad Quad(high Dose 65+) 06/19/2020, 06/03/2021   H1N1 08/09/2008   Influenza Split 07/27/2008, 06/28/2014   Influenza, High Dose Seasonal PF 05/18/2019, 06/04/2022    Influenza,inj,Quad PF,6+ Mos 06/20/2018   MMR 06/20/2010   Moderna SARS-COV2 Booster Vaccination 01/01/2021   Moderna Sars-Covid-2 Vaccination 11/20/2019, 12/19/2019, 08/06/2020, 01/01/2021, 08/13/2021   Pfizer Covid-19 Vaccine Bivalent Booster 138yr& up 07/27/2022   Pneumococcal Conjugate-13 05/04/2018   Pneumococcal Polysaccharide-23 05/18/2019   Pneumococcal-Unspecified 06/28/2014   Tdap 06/15/2019   Tetanus 07/22/2005   Zoster Recombinat (Shingrix) 02/19/2021, 04/24/2021     Objective: Vital Signs: BP (!) 152/74 (BP Location: Left Arm, Patient Position: Sitting, Cuff Size: Normal)   Pulse 60   Resp 16   Ht '4\' 10"'$  (1.473 m)   Wt 174 lb (78.9 kg)   BMI 36.37 kg/m    Physical Exam Vitals and nursing note reviewed.  Constitutional:      Appearance: She is well-developed.  HENT:     Head: Normocephalic and atraumatic.  Eyes:     Conjunctiva/sclera: Conjunctivae normal.  Cardiovascular:     Rate and Rhythm: Normal rate and regular rhythm.     Heart sounds: Murmur heard.  Pulmonary:     Effort: Pulmonary effort is normal.     Breath  sounds: Normal breath sounds.  Abdominal:     General: Bowel sounds are normal.     Palpations: Abdomen is soft.  Musculoskeletal:     Cervical back: Normal range of motion.  Skin:    General: Skin is warm and dry.     Capillary Refill: Capillary refill takes less than 2 seconds.  Neurological:     Mental Status: She is alert and oriented to person, place, and time.  Psychiatric:        Behavior: Behavior normal.      Musculoskeletal Exam: C-spine has good range of motion with no discomfort.  Shoulder joints, elbow joints, and wrist joints have good range of motion.  Bilateral PIP and DIP thickening noted.  Subluxation of several DIP joints.  Thickening of her bilateral second and third MCP joints.  Complete fist formation bilaterally.  Hip joints have good range of motion with no groin pain.  Left knee replacement has good range of  motion.  Right knee joint has crepitus but no warmth or effusion.  Ankle joints have good range of motion with no tenderness or synovitis.  CDAI Exam: CDAI Score: -- Patient Global: --; Provider Global: -- Swollen: --; Tender: -- Joint Exam 10/15/2022   No joint exam has been documented for this visit   There is currently no information documented on the homunculus. Go to the Rheumatology activity and complete the homunculus joint exam.  Investigation: No additional findings.  Imaging: XR Foot 2 Views Left  Result Date: 10/15/2022 First MTP severe narrowing and subluxation was noted.  PIP and DIP narrowing was noted.  Juxta-articular osteopenia was noted.  Intertarsal, tibiotalar and subtalar narrowing was noted.  Inferior and posterior calcaneal spurs were noted.  Erosive changes were noted over the fifth metatarsal head.  No radiographic progression was noted when compared to the x-rays of 2021. Impression: These findings consistent with rheumatoid arthritis and osteoarthritis overlap.  XR Foot 2 Views Right  Result Date: 10/15/2022 Juxta-articular osteopenia was noted.  Narrowing of all MTP joints, PIP and DIP joints was noted.  Intertarsal, tibiotalar and subtalar joint space narrowing was noted.  Inferior calcaneal spur was noted.  No radiographic progression was noted when compared to the x-rays of 2021. Impression: These findings are consistent with rheumatoid arthritis and osteoarthritis overlap.  XR Hand 2 View Left  Result Date: 10/15/2022 Juxta-articular osteopenia was noted.  Severe CMC narrowing and subluxation was noted.  PIP and DIP narrowing was noted.  Third digit PIP and DIP subluxation was noted.  Severe second and third MCP joint narrowing was noted.  Intercarpal and radiocarpal joint space narrowing was noted.  No erosive changes were noted.  No radiographic progression was noted when compared to the x-rays of 2021. Impression: These findings are consistent with  rheumatoid arthritis and osteoarthritis overlap.  XR Hand 2 View Right  Result Date: 10/15/2022 Juxta-articular osteopenia was noted.  CMC narrowing and subluxation was noted.  PIP and DIP narrowing was noted.  Subluxation of third and fourth PIP joints was noted.  Second DIP subluxation was noted.  Severe second and third MCP narrowing was noted.  Intercarpal and radiocarpal joint space narrowing was noted.  No radiographic progression was noted when compared to the x-rays of 2021.  No erosive changes were noted. Impression: These findings are consistent with rheumatoid arthritis and osteoarthritis overlap.   Recent Labs: Lab Results  Component Value Date   WBC 5.0 06/23/2022   HGB 9.0 (L) 06/23/2022   PLT 160  06/23/2022   NA 141 07/29/2022   K 4.0 07/29/2022   CL 105 07/29/2022   CO2 22 07/29/2022   GLUCOSE 95 07/29/2022   BUN 12 07/29/2022   CREATININE 1.01 (H) 07/29/2022   BILITOT 0.5 06/23/2022   ALKPHOS 53 06/23/2022   AST 18 06/23/2022   ALT 14 06/23/2022   PROT 6.4 (L) 06/23/2022   ALBUMIN 3.7 06/23/2022   CALCIUM 8.5 (L) 07/29/2022   GFRAA 44 (L) 12/19/2020    Speciality Comments: PLQ eye exam: 09/18/2021 WNL @ [facility] Follow up in 1 year  Procedures:  No procedures performed Allergies: Shellfish allergy    Assessment / Plan:     Visit Diagnoses: Rheumatoid arthritis involving multiple sites with positive rheumatoid factor (HCC) - Positive RF, positive anti-CCP: She has no synovitis on examination today.  She has been experiencing intermittent fleeting pain in the left second and third MCP joints off and on for the past 1 month.  On examination she had no tenderness upon palpation but does have some stiffness with full fist formation.  Patient requested to have updated x-rays of both hands.  No erosive changes or radiographic progression was noted since 2021.  X-rays of both feet were also obtained which were consistent with osteoarthritis and rheumatoid arthritis  overlap.  No radiographic progression was noted in her feet since 2021.  The patient remains on Plaquenil 200 mg 1 tablet by mouth daily.  She continues to tolerate Plaquenil without any side effects and has not missed any doses recently.  She will remain on Plaquenil as prescribed.  She was advised to notify us if her left hand pain persists or worsens at which time we can further evaluate.  She will follow-up in the office in 5 months or sooner if needed.- Plan: XR Hand 2 View Right, XR Hand 2 View Left, XR Foot 2 Views Right, XR Foot 2 Views Left  High risk medication use - Plaquenil 1 tablet by mouth daily.   PLQ eye exam: 09/18/2021 WNL @ [facility] Follow up in 1 year.  Eye exam is scheduled for March 2024.   CBC and CMP 06/23/22.  Patient will be having updated lab work drawn on 10/28/2022 and will have results forwarded to our office to review.  Other systemic lupus erythematosus with other organ involvement (HCC) - Positive ANA, positive Smith, positive RNP: She has not had any signs or symptoms of a systemic lupus flare.  Lab work from 05/13/2022 was reviewed today in the office: Smith antibody negative, RNP negative, sed rate within normal limits, complements within normal limits, double-stranded DNA 35.  Future orders for the following lab work will be placed today.  The patient will be having updated lab work on 10/28/2022.  She remains on Plaquenil 200 mg 1 tablet by mouth daily.  She is tolerating Plaquenil without any side effects.  She was advised to notify us if she develops signs or symptoms of a flare.  Idiopathic chronic gout of multiple sites without tophus: She has not had any signs or symptoms of a gout flare.  She remains on allopurinol 100 mg daily 3 days a week as prescribed by Dr. Baird Cancer for management of gout.  Her uric acid level was within the desirable range: 4.2 on 07/29/2022.  She will remain on allopurinol as prescribed.  Status post total knee replacement, left:  Performed on 02/02/2022 by Dr. Maureen Ralphs.  Completed physical therapy.  Plans on following up with Dr. Maureen Ralphs for  yearly follow-up in May 2024.  Good range of motion with no discomfort on examination today.  No warmth or effusion noted.  Primary osteoarthritis of right knee: She experiences intermittent pain and stiffness in the right knee joint.  She is considering a right total knee arthroplasty in 2025.  Age-related osteoporosis without current pathological fracture -  DEXA 09/03/22: RFN T-score -2.4 BMD 0.581--results were discussed today in detail.  All questions were addressed.Previous DEXA 08/09/20: Total right hip BMD 0.660 with T score -2.4.  Her last Prolia was administered on 06/09/2022.she continues to tolerate Prolia without any side effects.   Her next Prolia injection will be due in March 2024. She remains on calcium and vitamin D supplement daily.  No recent falls or fractures.  History of vitamin D deficiency: She is taking vitamin D 2000 units daily.  Other medical conditions are listed as follows:  MGUS (monoclonal gammopathy of unknown significance): Followed by Dr. Irene Limbo.   History of hypertension: Blood pressure was 152/74 today in the office.  Blood pressure was rechecked prior to the patient leaving and had improved.  Chronic anticoagulation: She remains on Eliquis as prescribed.  Paroxysmal atrial fibrillation (HCC)  History of diabetes mellitus  History of chronic kidney disease  History of asthma  History of anemia  Vitamin D deficiency  Orders: Orders Placed This Encounter  Procedures   XR Hand 2 View Right   XR Hand 2 View Left   XR Foot 2 Views Right   XR Foot 2 Views Left   Protein / creatinine ratio, urine   CBC with Differential/Platelet   COMPLETE METABOLIC PANEL WITH GFR   VITAMIN D 25 Hydroxy (Vit-D Deficiency, Fractures)   Sedimentation rate   C3 and C4   Anti-DNA antibody, double-stranded   ANA   No orders of the defined types were placed  in this encounter.    Follow-Up Instructions: Return in about 5 months (around 03/16/2023) for Rheumatoid arthritis, Systemic lupus erythematosus, Osteoporosis, Gout.   Ofilia Neas, PA-C  Note - This record has been created using Dragon software.  Chart creation errors have been sought, but may not always  have been located. Such creation errors do not reflect on  the standard of medical care.

## 2022-10-07 ENCOUNTER — Telehealth: Payer: Self-pay

## 2022-10-07 NOTE — Telephone Encounter (Signed)
Called to discuss PREP program, she has decided she wants to attend but would prefer a morning class; Will contact her with next T/Th morning class in March.

## 2022-10-15 ENCOUNTER — Encounter: Payer: Self-pay | Admitting: Physician Assistant

## 2022-10-15 ENCOUNTER — Ambulatory Visit (INDEPENDENT_AMBULATORY_CARE_PROVIDER_SITE_OTHER): Payer: Medicare HMO

## 2022-10-15 ENCOUNTER — Ambulatory Visit: Payer: Medicare HMO | Attending: Physician Assistant | Admitting: Physician Assistant

## 2022-10-15 ENCOUNTER — Ambulatory Visit: Payer: Medicare HMO

## 2022-10-15 VITALS — BP 152/74 | HR 60 | Resp 16 | Ht <= 58 in | Wt 174.0 lb

## 2022-10-15 DIAGNOSIS — M79642 Pain in left hand: Secondary | ICD-10-CM | POA: Diagnosis not present

## 2022-10-15 DIAGNOSIS — Z8709 Personal history of other diseases of the respiratory system: Secondary | ICD-10-CM

## 2022-10-15 DIAGNOSIS — M0579 Rheumatoid arthritis with rheumatoid factor of multiple sites without organ or systems involvement: Secondary | ICD-10-CM

## 2022-10-15 DIAGNOSIS — M810 Age-related osteoporosis without current pathological fracture: Secondary | ICD-10-CM | POA: Diagnosis not present

## 2022-10-15 DIAGNOSIS — Z8679 Personal history of other diseases of the circulatory system: Secondary | ICD-10-CM

## 2022-10-15 DIAGNOSIS — M79671 Pain in right foot: Secondary | ICD-10-CM

## 2022-10-15 DIAGNOSIS — M79672 Pain in left foot: Secondary | ICD-10-CM

## 2022-10-15 DIAGNOSIS — M3219 Other organ or system involvement in systemic lupus erythematosus: Secondary | ICD-10-CM

## 2022-10-15 DIAGNOSIS — M1A09X0 Idiopathic chronic gout, multiple sites, without tophus (tophi): Secondary | ICD-10-CM | POA: Diagnosis not present

## 2022-10-15 DIAGNOSIS — Z79899 Other long term (current) drug therapy: Secondary | ICD-10-CM | POA: Diagnosis not present

## 2022-10-15 DIAGNOSIS — M79641 Pain in right hand: Secondary | ICD-10-CM | POA: Diagnosis not present

## 2022-10-15 DIAGNOSIS — M1711 Unilateral primary osteoarthritis, right knee: Secondary | ICD-10-CM

## 2022-10-15 DIAGNOSIS — Z8639 Personal history of other endocrine, nutritional and metabolic disease: Secondary | ICD-10-CM | POA: Diagnosis not present

## 2022-10-15 DIAGNOSIS — Z96652 Presence of left artificial knee joint: Secondary | ICD-10-CM

## 2022-10-15 DIAGNOSIS — Z862 Personal history of diseases of the blood and blood-forming organs and certain disorders involving the immune mechanism: Secondary | ICD-10-CM

## 2022-10-15 DIAGNOSIS — D472 Monoclonal gammopathy: Secondary | ICD-10-CM | POA: Diagnosis not present

## 2022-10-15 DIAGNOSIS — Z87448 Personal history of other diseases of urinary system: Secondary | ICD-10-CM

## 2022-10-15 DIAGNOSIS — I480 Paroxysmal atrial fibrillation: Secondary | ICD-10-CM

## 2022-10-15 DIAGNOSIS — Z7901 Long term (current) use of anticoagulants: Secondary | ICD-10-CM

## 2022-10-15 DIAGNOSIS — E559 Vitamin D deficiency, unspecified: Secondary | ICD-10-CM

## 2022-10-15 NOTE — Progress Notes (Signed)
X-rays of both feet are consistent with rheumatoid arthritis and osteoarthritis overlap.  No radiographic progression noted since 2021. Please notify the patient.

## 2022-10-15 NOTE — Progress Notes (Signed)
X-rays of both hands are consistent with rheumatoid arthritis and osteoarthritis overlap.  No erosive changes noted.  Severe narrowing of second and third MCP joints bilaterally.  No radiographic progression was noted when compared to 2021. Please notify the patient.

## 2022-10-19 ENCOUNTER — Telehealth: Payer: Self-pay | Admitting: Rheumatology

## 2022-10-19 NOTE — Telephone Encounter (Signed)
Returned patient's call and advised patient that lab orders were placed by Lovena Le last week to have drawn at Dr. Grier Mitts office. If Dr. Grier Mitts office is unable to draw labs, patient will return to our office for lab draw. Provided patient with lab hours. Patient verbalized understanding.

## 2022-10-19 NOTE — Telephone Encounter (Signed)
Patient left a voicemail stating she was returning Andrea's call regarding her labwork.

## 2022-10-27 ENCOUNTER — Other Ambulatory Visit: Payer: Self-pay

## 2022-10-27 DIAGNOSIS — D472 Monoclonal gammopathy: Secondary | ICD-10-CM

## 2022-10-28 ENCOUNTER — Inpatient Hospital Stay: Payer: Medicare HMO | Attending: Hematology

## 2022-10-28 DIAGNOSIS — D472 Monoclonal gammopathy: Secondary | ICD-10-CM | POA: Diagnosis not present

## 2022-10-28 DIAGNOSIS — M329 Systemic lupus erythematosus, unspecified: Secondary | ICD-10-CM | POA: Insufficient documentation

## 2022-10-28 DIAGNOSIS — D649 Anemia, unspecified: Secondary | ICD-10-CM | POA: Insufficient documentation

## 2022-10-28 DIAGNOSIS — N189 Chronic kidney disease, unspecified: Secondary | ICD-10-CM | POA: Insufficient documentation

## 2022-10-28 LAB — CBC WITH DIFFERENTIAL (CANCER CENTER ONLY)
Abs Immature Granulocytes: 0.01 10*3/uL (ref 0.00–0.07)
Basophils Absolute: 0.1 10*3/uL (ref 0.0–0.1)
Basophils Relative: 1 %
Eosinophils Absolute: 0.4 10*3/uL (ref 0.0–0.5)
Eosinophils Relative: 9 %
HCT: 29.8 % — ABNORMAL LOW (ref 36.0–46.0)
Hemoglobin: 9.9 g/dL — ABNORMAL LOW (ref 12.0–15.0)
Immature Granulocytes: 0 %
Lymphocytes Relative: 24 %
Lymphs Abs: 1.2 10*3/uL (ref 0.7–4.0)
MCH: 33 pg (ref 26.0–34.0)
MCHC: 33.2 g/dL (ref 30.0–36.0)
MCV: 99.3 fL (ref 80.0–100.0)
Monocytes Absolute: 0.5 10*3/uL (ref 0.1–1.0)
Monocytes Relative: 9 %
Neutro Abs: 2.8 10*3/uL (ref 1.7–7.7)
Neutrophils Relative %: 57 %
Platelet Count: 174 10*3/uL (ref 150–400)
RBC: 3 MIL/uL — ABNORMAL LOW (ref 3.87–5.11)
RDW: 13.3 % (ref 11.5–15.5)
WBC Count: 5 10*3/uL (ref 4.0–10.5)
nRBC: 0 % (ref 0.0–0.2)

## 2022-10-28 LAB — CMP (CANCER CENTER ONLY)
ALT: 13 U/L (ref 0–44)
AST: 23 U/L (ref 15–41)
Albumin: 3.7 g/dL (ref 3.5–5.0)
Alkaline Phosphatase: 52 U/L (ref 38–126)
Anion gap: 4 — ABNORMAL LOW (ref 5–15)
BUN: 18 mg/dL (ref 8–23)
CO2: 30 mmol/L (ref 22–32)
Calcium: 9.4 mg/dL (ref 8.9–10.3)
Chloride: 106 mmol/L (ref 98–111)
Creatinine: 1.09 mg/dL — ABNORMAL HIGH (ref 0.44–1.00)
GFR, Estimated: 52 mL/min — ABNORMAL LOW (ref >60)
Glucose, Bld: 84 mg/dL (ref 70–99)
Potassium: 4 mmol/L (ref 3.5–5.1)
Sodium: 140 mmol/L (ref 135–145)
Total Bilirubin: 0.4 mg/dL (ref 0.3–1.2)
Total Protein: 6.7 g/dL (ref 6.5–8.1)

## 2022-10-28 LAB — FERRITIN: Ferritin: 26 ng/mL (ref 11–307)

## 2022-10-28 LAB — IRON AND IRON BINDING CAPACITY (CC-WL,HP ONLY)
Iron: 78 ug/dL (ref 28–170)
Saturation Ratios: 27 % (ref 10.4–31.8)
TIBC: 291 ug/dL (ref 250–450)
UIBC: 213 ug/dL (ref 148–442)

## 2022-10-29 LAB — KAPPA/LAMBDA LIGHT CHAINS
Kappa free light chain: 49.7 mg/L — ABNORMAL HIGH (ref 3.3–19.4)
Kappa, lambda light chain ratio: 0.2 — ABNORMAL LOW (ref 0.26–1.65)
Lambda free light chains: 247.1 mg/L — ABNORMAL HIGH (ref 5.7–26.3)

## 2022-11-04 ENCOUNTER — Inpatient Hospital Stay: Payer: Medicare HMO

## 2022-11-04 ENCOUNTER — Inpatient Hospital Stay: Payer: Medicare HMO | Attending: Hematology | Admitting: Hematology

## 2022-11-04 VITALS — BP 160/66 | HR 72 | Temp 97.7°F | Resp 18 | Wt 172.6 lb

## 2022-11-04 DIAGNOSIS — Z7901 Long term (current) use of anticoagulants: Secondary | ICD-10-CM | POA: Diagnosis not present

## 2022-11-04 DIAGNOSIS — M79643 Pain in unspecified hand: Secondary | ICD-10-CM | POA: Diagnosis not present

## 2022-11-04 DIAGNOSIS — Z79899 Other long term (current) drug therapy: Secondary | ICD-10-CM | POA: Insufficient documentation

## 2022-11-04 DIAGNOSIS — M069 Rheumatoid arthritis, unspecified: Secondary | ICD-10-CM | POA: Insufficient documentation

## 2022-11-04 DIAGNOSIS — E1122 Type 2 diabetes mellitus with diabetic chronic kidney disease: Secondary | ICD-10-CM | POA: Insufficient documentation

## 2022-11-04 DIAGNOSIS — D508 Other iron deficiency anemias: Secondary | ICD-10-CM

## 2022-11-04 DIAGNOSIS — D649 Anemia, unspecified: Secondary | ICD-10-CM | POA: Diagnosis not present

## 2022-11-04 DIAGNOSIS — N183 Chronic kidney disease, stage 3 unspecified: Secondary | ICD-10-CM | POA: Diagnosis not present

## 2022-11-04 DIAGNOSIS — J45909 Unspecified asthma, uncomplicated: Secondary | ICD-10-CM | POA: Diagnosis not present

## 2022-11-04 DIAGNOSIS — D472 Monoclonal gammopathy: Secondary | ICD-10-CM | POA: Diagnosis not present

## 2022-11-04 DIAGNOSIS — M329 Systemic lupus erythematosus, unspecified: Secondary | ICD-10-CM | POA: Diagnosis not present

## 2022-11-04 DIAGNOSIS — I129 Hypertensive chronic kidney disease with stage 1 through stage 4 chronic kidney disease, or unspecified chronic kidney disease: Secondary | ICD-10-CM | POA: Diagnosis not present

## 2022-11-04 DIAGNOSIS — M47816 Spondylosis without myelopathy or radiculopathy, lumbar region: Secondary | ICD-10-CM | POA: Insufficient documentation

## 2022-11-04 DIAGNOSIS — M4316 Spondylolisthesis, lumbar region: Secondary | ICD-10-CM | POA: Insufficient documentation

## 2022-11-04 LAB — MULTIPLE MYELOMA PANEL, SERUM
Albumin SerPl Elph-Mcnc: 3.6 g/dL (ref 2.9–4.4)
Albumin/Glob SerPl: 1.5 (ref 0.7–1.7)
Alpha 1: 0.2 g/dL (ref 0.0–0.4)
Alpha2 Glob SerPl Elph-Mcnc: 0.7 g/dL (ref 0.4–1.0)
B-Globulin SerPl Elph-Mcnc: 0.7 g/dL (ref 0.7–1.3)
Gamma Glob SerPl Elph-Mcnc: 0.9 g/dL (ref 0.4–1.8)
Globulin, Total: 2.5 g/dL (ref 2.2–3.9)
IgA: 139 mg/dL (ref 64–422)
IgG (Immunoglobin G), Serum: 939 mg/dL (ref 586–1602)
IgM (Immunoglobulin M), Srm: 32 mg/dL (ref 26–217)
M Protein SerPl Elph-Mcnc: 0.3 g/dL — ABNORMAL HIGH
Total Protein ELP: 6.1 g/dL (ref 6.0–8.5)

## 2022-11-04 NOTE — Progress Notes (Signed)
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 Patient was last seen by me on 07/08/2022 and she was doing well overall.   Patient reports she has been doing well overall since our last visit. She was having hand pain/tingling sensation due to rheumatid arthritis. She has been to her Rheumatologist regarding her RA, who prescribed her prednisone.   She denies fever, chills, night sweats, unexpected weight loss, back pain, chest pain, abdominal pain, chest pain, or leg swelling. She reports that occasionally her fingertips tend to be cold and mildly white.   She is not taking iron supplements.     REVIEW OF SYSTEMS:   10 Point review of Systems was done is negative except as noted above.  . Past Medical History:  Diagnosis Date   Anemia    Arthritis    Asthma    Atrial fibrillation ([place])    Chronic kidney disease    Diabetes mellitus without complication ([place])    Gout 12/17/2014   patient reported   Hyperlipidemia    Hypertension    Hypothyroidism    Systemic lupus erythematosus ([place])    Vitiligo   possible SLE/rheumatoid arthritis following with Dr.Deveshwar.  . Past Surgical History:  Procedure Laterality Date   CATARACT EXTRACTION Bilateral 2015   DILATION AND CURETTAGE OF UTERUS     DOPPLER ECHOCARDIOGRAPHY  05/2018   Internist to review with pt; potential heart murmur 06/20/18   keratosis removal  2021   KNEE CLOSED REDUCTION Left 07/06/2022   Procedure: CLOSED  MANIPULATION KNEE;  Surgeon: Gaynelle Arabian, MD;  Location: [facility];  Service: Orthopedics;  Laterality: Left;   REPLACEMENT TOTAL KNEE Left    SKIN SURGERY  11/30/2018   left side of face   TOOTH EXTRACTION     TOTAL KNEE ARTHROPLASTY Left 02/02/2022   Procedure: TOTAL KNEE ARTHROPLASTY;  Surgeon: Gaynelle Arabian, MD;  Location: [facility];  Service: Orthopedics;  Laterality: Left;    . Social History   Tobacco Use   Smoking status: Never    Passive exposure: Never   Smokeless tobacco: Never  Vaping Use   Vaping Use: Never used  Substance Use Topics   Alcohol use: Not Currently   Drug use: No    ALLERGIES:  is allergic to shellfish allergy.  MEDICATIONS:  Current Outpatient Medications  Medication Sig Dispense Refill   Accu-Chek Softclix Lancets lancets Use to check blood sugars daily E11.69 100 each 2   albuterol (PROVENTIL HFA;VENTOLIN HFA) 108 (90 Base) MCG/ACT inhaler Inhale 2 puffs into the lungs every 6 (six) hours as needed for wheezing or shortness of breath. 1 Inhaler 2   Alcohol Swabs (ALCOHOL PADS) 70 % PADS Use as directed to check blood sugars 1 time per day dx: e11.22 150 each 2   allopurinol (ZYLOPRIM) 100 MG tablet Take 1 tablet (100 mg total) by mouth 3 (three) times a week for 90 doses. 12 tablet 6   amLODipine (NORVASC) 2.5 MG tablet TAKE 1 TABLET(2.5  MG) BY MOUTH DAILY 30 tablet 11   apixaban (ELIQUIS) 5 MG TABS tablet Take 1 tablet (5 mg total) by mouth 2 (two) times daily. 180 tablet 1   Ascorbic Acid (VITAMIN C) 1000 MG tablet Take 1,000 mg by mouth daily.     atorvastatin (LIPITOR) 80 MG tablet TAKE 1 TABLET BY MOUTH EVERY DAY 90 tablet 1   budesonide-formoterol (SYMBICORT) 160-4.5 MCG/ACT inhaler INHALE 2 PUFFS BY MOUTH TWICE DAILY IN THE MORNING AND IN THE EVENING 10.2 g 6   Calcium Carb-Cholecalciferol 600-800 MG-UNIT TABS Take 1 tablet by mouth daily.      Cholecalciferol (VITAMIN D PO) Take 2,000 Units by mouth daily.     cyanocobalamin 1000 MCG tablet  Take 1,000 mcg by mouth daily.     denosumab (PROLIA) 60 MG/ML SOSY injection Inject 60 mg into the skin every 6 (six) months. Courier to rheum: [address] [place], [place] [address]. Appt on 06/09/22 1 mL 0   glucose blood (ACCU-CHEK GUIDE) test strip Use to check blood sugars daily E11.69 100 each 2   hydroxychloroquine (PLAQUENIL) 200 MG tablet TAKE 1 TABLET(200 MG) BY MOUTH DAILY 90 tablet 0   Hypromellose (ARTIFICIAL TEARS OP) Apply 1 drop to eye daily as needed (dry eyes).     irbesartan (AVAPRO) 300 MG tablet TAKE 1 TABLET(300 MG) BY MOUTH DAILY 90 tablet 2   iron polysaccharides (NIFEREX) 150 MG capsule Take 150 mg by mouth daily. (Patient not taking: Reported on 10/15/2022)     metoprolol succinate (TOPROL-XL) 25 MG 24 hr tablet TAKE 1 TABLET(25 MG) BY MOUTH DAILY 90 tablet 1   SYNTHROID 88 MCG tablet TAKE 1 TABLET BY MOUTH EVERY DAY MONDAY TO SATURDAY AND OFF ON SUNDAYS 90 tablet 0   No current facility-administered medications for this visit.    PHYSICAL EXAMINATION: ECOG PERFORMANCE STATUS: 1 - Symptomatic but completely ambulatory  Vitals:   11/04/22 0945  BP: (!) 160/66  Pulse: 72  Resp: 18  Temp: 97.7 F (36.5 C)  SpO2: 100%   Filed Weights   11/04/22 0945  Weight: 172 lb 9.6 oz (78.3 kg)  .Body mass index is 36.07 kg/m.   NAD GENERAL:alert, in no acute distress and comfortable SKIN: no acute rashes, no significant lesions EYES: conjunctiva are pink and non-injected, sclera anicteric OROPHARYNX: MMM, no exudates, no oropharyngeal erythema or ulceration NECK: supple, no JVD LYMPH:  no palpable lymphadenopathy in the cervical, axillary or inguinal regions LUNGS: clear to auscultation b/l with normal respiratory effort HEART: regular rate & rhythm ABDOMEN:  normoactive bowel sounds , non tender, not distended. No palpable hepatosplenomegaly.  Extremity: no pedal edema PSYCH: alert & oriented x 3 with fluent speech NEURO: no focal motor/sensory  deficits  LABORATORY DATA:   I have reviewed the data as listed  .    Latest Ref Rng & Units 11/05/2022    2:53 PM 10/28/2022    8:47 AM 06/23/2022    7:35 AM  CBC  WBC 3.8 - 10.8 Thousand/uL 5.0  5.0  5.0   Hemoglobin 11.7 - 15.5 g/dL 9.8  9.9  9.0   Hematocrit 35.0 - 45.0 % 29.0  29.8  27.8   Platelets 140 - 400 Thousand/uL 189  174  160    .    Latest Ref Rng & Units 11/05/2022    2:53 PM 10/28/2022    8:47 AM 07/29/2022    3:46 PM  CMP  Glucose 65 - 99 mg/dL 105  84  95   BUN 7 - 25 mg/dL 17  18  12   $ Creatinine 0.60 - 1.00 mg/dL 1.07  1.09  1.01   Sodium 135 - 146 mmol/L 144  140  141   Potassium 3.5 - 5.3 mmol/L 4.0  4.0  4.0   Chloride 98 - 110 mmol/L 108  106  105   CO2 20 - 32 mmol/L 27  30  22   $ Calcium 8.6 - 10.4 mg/dL 9.5  9.4  8.5   Total Protein 6.1 - 8.1 g/dL 6.6  6.7    Total Bilirubin 0.2 - 1.2 mg/dL 0.4  0.4    Alkaline Phos 38 - 126 U/L  52    AST 10 - 35 U/L 22  23    ALT 6 - 29 U/L 14  13     Lab Results  Component Value Date   IRON 78 10/28/2022   TIBC 291 10/28/2022   IRONPCTSAT 27 10/28/2022   (Iron and TIBC)  Lab Results  Component Value Date   FERRITIN 26 10/28/2022          RADIOGRAPHIC STUDIES: I have personally reviewed the radiological images as listed and agreed with the findings in the report.  METASTATIC BONE SURVEY 0/02/2015   COMPARISON:  None.   FINDINGS: Heart is normal size. Right diaphragmatic hernia again noted, unchanged. Lungs are clear. No effusions.   No focal lytic lesions within the visualized bony structures or acute bony abnormality. Degenerative changes within the shoulders, lower cervical spine, mid to lower thoracic spine, and lower lumbar spine. Advanced degenerative facet disease in the lower lumbar spine. 9 mm of anterolisthesis of L4 on L5. Endplate sclerosis within L4 and L5. Mild degenerative changes in the hips. Advanced degenerative changes within the knees. Sclerosis around both SI joints  compatible with sacroiliitis.   IMPRESSION: No focal lytic lesion.   Degenerative joint disease involving multiple joints as described above.   Grade 2 anterolisthesis of L4 on L5 related to facet disease.   ASSESSMENT & PLAN:   77 year old of an American female with   #1 IgG kappa monoclonal gammopathy of undetermined significance. Bone survey X-ray showed no lytic lesions. M spike is 0.6 g/dL in 10/016 and then 0.5g/dl in 10/2015. SPEP in May/2017 and 07/2016 showed M proteinwas stable at 0.3g/dl  MGUS likely related to underlying connective tissue disorder.  Bone marrow biopsy does show about 13% plasma cells however these appear to be polyclonal and likely related to a possible underlying inflammatory disorder. Less likely a biclonal plasma cell dyscrasia.  #2 Normocytic normochromic anemia likely related to chronic inflammation from recent diagnosis of lupus/rheumatoid arthritis + CKD.  Increased acanthocytes on peripheral blood smear -no overt evidence of liver disease. Could be a marker of some MDS Bone marrow biopsy did not show overt plasma cell dyscrasia or myelodysplastic syndrome. 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 Bone marrow biopsy showed decreased iron stores. Ferritin and iron have been trending down, she has not noticed any issues with bruising or bleeding recently. ?absorption issue. I have given her the option of IV iron which she has previously had without issue. She would like to have this.   PLAN: -Discussed lab results from 10/28/2022 with the patient. CBC shows slightly decreased at 9.9 (which is her normal) and hematocrit of 29.8. CMP is stable. Patient is mildly anemic.  Ferritin is around  26.  Kappa/lambda light chain is 0.20.  M-protein is 0.3g/dl -Patient will get IV Iron infusions. IV Venofer 349m weekly x 3 doses for maintain ferritin>100 in the context of CKD and anemia of  chronic inflammation from RA -continue to optimize RA rx with PCP.  FOLLOW-UP: IV Venofer 3020mweekly x 3 doses RTC with Dr KaIrene Limboith labs in 6 months    The total time spent in the appointment was 20 minutes* .  All of the patient's questions were answered with apparent satisfaction. The patient knows to call the clinic with any problems, questions or concerns.   GaSullivan LoneD MS AAHIVMS SCTexas General HospitalTMunson Medical Centerematology/Oncology Physician CoMercy Hospital [facility]ong[facility].*Total Encounter Time as defined by the Centers for Medicare and Medicaid Services includes, in addition to the face-to-face time of a patient visit (documented in the note above) non-face-to-face time: obtaining and reviewing outside history, ordering and reviewing medications, tests or procedures, care coordination (communications with other health care professionals or caregivers) and documentation in the medical record.   I, PaCleda Mccreedyam acting as a scEducation administratoror GaSullivan LoneMD.  .I have reviewed the above documentation for accuracy and completeness, and I agree with the above. .GBrunetta GeneraD

## 2022-11-05 ENCOUNTER — Other Ambulatory Visit: Payer: Self-pay | Admitting: *Deleted

## 2022-11-05 DIAGNOSIS — E559 Vitamin D deficiency, unspecified: Secondary | ICD-10-CM | POA: Diagnosis not present

## 2022-11-05 DIAGNOSIS — M3219 Other organ or system involvement in systemic lupus erythematosus: Secondary | ICD-10-CM

## 2022-11-05 DIAGNOSIS — Z79899 Other long term (current) drug therapy: Secondary | ICD-10-CM

## 2022-11-06 ENCOUNTER — Telehealth: Payer: Self-pay | Admitting: Hematology

## 2022-11-06 NOTE — Telephone Encounter (Signed)
Patient called to reschedule 2/17 appointment due to prior arrangements. Patient rescheduled and notified.

## 2022-11-07 LAB — CBC WITH DIFFERENTIAL/PLATELET
Absolute Monocytes: 475 cells/uL (ref 200–950)
Basophils Absolute: 50 cells/uL (ref 0–200)
Basophils Relative: 1 %
Eosinophils Absolute: 435 cells/uL (ref 15–500)
Eosinophils Relative: 8.7 %
HCT: 29 % — ABNORMAL LOW (ref 35.0–45.0)
Hemoglobin: 9.8 g/dL — ABNORMAL LOW (ref 11.7–15.5)
Lymphs Abs: 1170 cells/uL (ref 850–3900)
MCH: 32.1 pg (ref 27.0–33.0)
MCHC: 33.8 g/dL (ref 32.0–36.0)
MCV: 95.1 fL (ref 80.0–100.0)
MPV: 12.4 fL (ref 7.5–12.5)
Monocytes Relative: 9.5 %
Neutro Abs: 2870 cells/uL (ref 1500–7800)
Neutrophils Relative %: 57.4 %
Platelets: 189 10*3/uL (ref 140–400)
RBC: 3.05 10*6/uL — ABNORMAL LOW (ref 3.80–5.10)
RDW: 13.3 % (ref 11.0–15.0)
Total Lymphocyte: 23.4 %
WBC: 5 10*3/uL (ref 3.8–10.8)

## 2022-11-07 LAB — COMPLETE METABOLIC PANEL WITH GFR
AG Ratio: 1.5 (calc) (ref 1.0–2.5)
ALT: 14 U/L (ref 6–29)
AST: 22 U/L (ref 10–35)
Albumin: 4 g/dL (ref 3.6–5.1)
Alkaline phosphatase (APISO): 53 U/L (ref 37–153)
BUN/Creatinine Ratio: 16 (calc) (ref 6–22)
BUN: 17 mg/dL (ref 7–25)
CO2: 27 mmol/L (ref 20–32)
Calcium: 9.5 mg/dL (ref 8.6–10.4)
Chloride: 108 mmol/L (ref 98–110)
Creat: 1.07 mg/dL — ABNORMAL HIGH (ref 0.60–1.00)
Globulin: 2.6 g/dL (calc) (ref 1.9–3.7)
Glucose, Bld: 105 mg/dL — ABNORMAL HIGH (ref 65–99)
Potassium: 4 mmol/L (ref 3.5–5.3)
Sodium: 144 mmol/L (ref 135–146)
Total Bilirubin: 0.4 mg/dL (ref 0.2–1.2)
Total Protein: 6.6 g/dL (ref 6.1–8.1)
eGFR: 53 mL/min/{1.73_m2} — ABNORMAL LOW (ref >=60)

## 2022-11-07 LAB — PROTEIN / CREATININE RATIO, URINE
Creatinine, Urine: 335 mg/dL — ABNORMAL HIGH (ref 20–275)
Protein/Creat Ratio: 281 mg/g creat — ABNORMAL HIGH (ref 24–184)
Protein/Creatinine Ratio: 0.281 mg/mg creat — ABNORMAL HIGH (ref 0.024–0.184)
Total Protein, Urine: 94 mg/dL — ABNORMAL HIGH (ref 5–24)

## 2022-11-07 LAB — C3 AND C4
C3 Complement: 117 mg/dL (ref 83–193)
C4 Complement: 37 mg/dL (ref 15–57)

## 2022-11-07 LAB — VITAMIN D 25 HYDROXY (VIT D DEFICIENCY, FRACTURES): Vit D, 25-Hydroxy: 54 ng/mL (ref 30–100)

## 2022-11-07 LAB — ANTI-NUCLEAR AB-TITER (ANA TITER)
ANA TITER: 1:1280 {titer} — ABNORMAL HIGH
ANA Titer 1: 1:80 {titer} — ABNORMAL HIGH

## 2022-11-07 LAB — ANTI-DNA ANTIBODY, DOUBLE-STRANDED: ds DNA Ab: 41 IU/mL — ABNORMAL HIGH

## 2022-11-07 LAB — ANA: Anti Nuclear Antibody (ANA): POSITIVE — AB

## 2022-11-07 LAB — SEDIMENTATION RATE: Sed Rate: 22 mm/h (ref 0–30)

## 2022-11-09 ENCOUNTER — Other Ambulatory Visit: Payer: Self-pay

## 2022-11-09 ENCOUNTER — Ambulatory Visit: Payer: Self-pay

## 2022-11-09 NOTE — Patient Outreach (Signed)
  Care Coordination   Follow Up Visit Note   11/09/2022 Name: Hannah [name] MRN: [id number] DOB: [birth year]  Hannah [name] is a 77 y.o. year old female who sees Glendale Chard, MD for primary care. I spoke with  Hannah [name] by phone today.  What matters to the patients health and wellness today?  Patient will continue to adhere to her established exercise routine, she will start PREP in mid March.     Goals Addressed               This Visit's Progress     Patient Stated     COMPLETED: I hope to acheive better range of motion in my left knee (pt-stated)        Care Coordination Interventions: Evaluation of current treatment plan related to osteoarthritis of left knee and status post left knee arthroplasty  and patient's adherence to plan as established by provider Determined patient has an established exercise routine  Discussed patient will start the PREP program in mid March        SDOH assessments and interventions completed:  No     Care Coordination Interventions:  Yes, provided   Follow up plan: Follow up call scheduled for 01/14/23 '@12'$  PM     Encounter Outcome:  Pt. Visit Completed

## 2022-11-09 NOTE — Patient Instructions (Signed)
Visit Information  Thank you for taking time to visit with me today. Please don't hesitate to contact me if I can be of assistance to you.   Following are the goals we discussed today:   Goals Addressed               This Visit's Progress     Patient Stated     COMPLETED: I hope to acheive better range of motion in my left knee (pt-stated)        Care Coordination Interventions: Evaluation of current treatment plan related to osteoarthritis of left knee and status post left knee arthroplasty  and patient's adherence to plan as established by provider Determined patient has an established exercise routine  Discussed patient will start the PREP program in mid March        Our next appointment is by telephone on 01/14/23 at 12 PM   Please call the care guide team at [phone] if you need to cancel or reschedule your appointment.   If you are experiencing a Mental Health or [place] or need someone to talk to, please call 1-800-273-TALK (toll free, 24 hour hotline) go to [facility] [address], [place] [phone])  Patient verbalizes understanding of instructions and care plan provided today and agrees to view in [place]. Active MyChart status and patient understanding of how to access instructions and care plan via MyChart confirmed with patient.     Barb Merino, RN, BSN, CCM Care Management Coordinator [facility] Care Management Direct Phone: [phone]

## 2022-11-10 ENCOUNTER — Other Ambulatory Visit: Payer: Self-pay | Admitting: *Deleted

## 2022-11-10 ENCOUNTER — Encounter: Payer: Self-pay | Admitting: Hematology

## 2022-11-10 ENCOUNTER — Telehealth: Payer: Self-pay | Admitting: *Deleted

## 2022-11-10 ENCOUNTER — Other Ambulatory Visit: Payer: Self-pay

## 2022-11-10 DIAGNOSIS — M0579 Rheumatoid arthritis with rheumatoid factor of multiple sites without organ or systems involvement: Secondary | ICD-10-CM

## 2022-11-10 MED ORDER — ALLOPURINOL 100 MG PO TABS: 100.0000 mg | ORAL_TABLET | ORAL | 2 refills | Status: DC

## 2022-11-10 MED ORDER — AMLODIPINE BESYLATE 2.5 MG PO TABS: ORAL_TABLET | ORAL | 2 refills | Status: DC

## 2022-11-10 NOTE — Telephone Encounter (Signed)
Patient due for Prolia. Please call patient. Thank you.

## 2022-11-10 NOTE — Progress Notes (Signed)
ANA remains positive.  dsDNA is positive and continues to trend up.  Complements WNL.  ESR WNL.   Vitamin D WNL.  Creatinine is borderline elevated and GFR is slightly low-53. Avoid NSAID use.  Anemia stable. Protein creatinine ratio remains borderline elevated.  Please forward results to nephrology.     Reviewed lab work with Dr. Toma Deiters increasing plaquenil to 200 mg 1 tablet by mouth twice daily Monday through Friday.  Repeat lab work in 3 months.

## 2022-11-11 MED ORDER — HYDROXYCHLOROQUINE SULFATE 200 MG PO TABS: ORAL_TABLET | ORAL | 0 refills | Status: DC

## 2022-11-13 ENCOUNTER — Inpatient Hospital Stay: Payer: Medicare HMO

## 2022-11-13 ENCOUNTER — Other Ambulatory Visit: Payer: Self-pay | Admitting: Hematology

## 2022-11-13 VITALS — BP 158/66 | HR 56 | Temp 98.5°F | Resp 18

## 2022-11-13 DIAGNOSIS — M069 Rheumatoid arthritis, unspecified: Secondary | ICD-10-CM | POA: Diagnosis not present

## 2022-11-13 DIAGNOSIS — E1122 Type 2 diabetes mellitus with diabetic chronic kidney disease: Secondary | ICD-10-CM | POA: Diagnosis not present

## 2022-11-13 DIAGNOSIS — M329 Systemic lupus erythematosus, unspecified: Secondary | ICD-10-CM | POA: Diagnosis not present

## 2022-11-13 DIAGNOSIS — J45909 Unspecified asthma, uncomplicated: Secondary | ICD-10-CM | POA: Diagnosis not present

## 2022-11-13 DIAGNOSIS — D649 Anemia, unspecified: Secondary | ICD-10-CM | POA: Diagnosis not present

## 2022-11-13 DIAGNOSIS — D508 Other iron deficiency anemias: Secondary | ICD-10-CM

## 2022-11-13 DIAGNOSIS — M79643 Pain in unspecified hand: Secondary | ICD-10-CM | POA: Diagnosis not present

## 2022-11-13 DIAGNOSIS — I129 Hypertensive chronic kidney disease with stage 1 through stage 4 chronic kidney disease, or unspecified chronic kidney disease: Secondary | ICD-10-CM | POA: Diagnosis not present

## 2022-11-13 DIAGNOSIS — D472 Monoclonal gammopathy: Secondary | ICD-10-CM | POA: Diagnosis not present

## 2022-11-13 DIAGNOSIS — N183 Chronic kidney disease, stage 3 unspecified: Secondary | ICD-10-CM | POA: Diagnosis not present

## 2022-11-13 MED ORDER — LORATADINE 10 MG PO TABS
10.0000 mg | ORAL_TABLET | Freq: Once | ORAL | Status: AC
  Administered 2022-11-13: 10 mg via ORAL
  Filled 2022-11-13: qty 1

## 2022-11-13 MED ORDER — ACETAMINOPHEN 325 MG PO TABS
650.0000 mg | ORAL_TABLET | Freq: Once | ORAL | Status: AC
  Administered 2022-11-13: 650 mg via ORAL
  Filled 2022-11-13: qty 2

## 2022-11-13 MED ORDER — SODIUM CHLORIDE 0.9 % IV SOLN: Freq: Once | INTRAVENOUS | Status: AC

## 2022-11-13 MED ORDER — SODIUM CHLORIDE 0.9 % IV SOLN
300.0000 mg | Freq: Once | INTRAVENOUS | Status: AC
  Administered 2022-11-13: 300 mg via INTRAVENOUS
  Filled 2022-11-13: qty 200

## 2022-11-13 NOTE — Patient Instructions (Signed)

## 2022-11-14 ENCOUNTER — Inpatient Hospital Stay: Payer: Medicare HMO

## 2022-11-16 ENCOUNTER — Other Ambulatory Visit: Payer: Self-pay

## 2022-11-18 DIAGNOSIS — L820 Inflamed seborrheic keratosis: Secondary | ICD-10-CM | POA: Diagnosis not present

## 2022-11-18 DIAGNOSIS — D2262 Melanocytic nevi of left upper limb, including shoulder: Secondary | ICD-10-CM | POA: Diagnosis not present

## 2022-11-18 DIAGNOSIS — L811 Chloasma: Secondary | ICD-10-CM | POA: Diagnosis not present

## 2022-11-18 DIAGNOSIS — L814 Other melanin hyperpigmentation: Secondary | ICD-10-CM | POA: Diagnosis not present

## 2022-11-18 DIAGNOSIS — L821 Other seborrheic keratosis: Secondary | ICD-10-CM | POA: Diagnosis not present

## 2022-11-21 ENCOUNTER — Inpatient Hospital Stay: Payer: Medicare HMO

## 2022-11-21 ENCOUNTER — Other Ambulatory Visit: Payer: Self-pay

## 2022-11-21 VITALS — BP 152/72 | HR 52 | Temp 97.8°F | Resp 16 | Ht <= 58 in

## 2022-11-21 DIAGNOSIS — D472 Monoclonal gammopathy: Secondary | ICD-10-CM | POA: Diagnosis not present

## 2022-11-21 DIAGNOSIS — M069 Rheumatoid arthritis, unspecified: Secondary | ICD-10-CM | POA: Diagnosis not present

## 2022-11-21 DIAGNOSIS — I129 Hypertensive chronic kidney disease with stage 1 through stage 4 chronic kidney disease, or unspecified chronic kidney disease: Secondary | ICD-10-CM | POA: Diagnosis not present

## 2022-11-21 DIAGNOSIS — D649 Anemia, unspecified: Secondary | ICD-10-CM | POA: Diagnosis not present

## 2022-11-21 DIAGNOSIS — M329 Systemic lupus erythematosus, unspecified: Secondary | ICD-10-CM | POA: Diagnosis not present

## 2022-11-21 DIAGNOSIS — N183 Chronic kidney disease, stage 3 unspecified: Secondary | ICD-10-CM | POA: Diagnosis not present

## 2022-11-21 DIAGNOSIS — J45909 Unspecified asthma, uncomplicated: Secondary | ICD-10-CM | POA: Diagnosis not present

## 2022-11-21 DIAGNOSIS — D508 Other iron deficiency anemias: Secondary | ICD-10-CM

## 2022-11-21 DIAGNOSIS — E1122 Type 2 diabetes mellitus with diabetic chronic kidney disease: Secondary | ICD-10-CM | POA: Diagnosis not present

## 2022-11-21 DIAGNOSIS — M79643 Pain in unspecified hand: Secondary | ICD-10-CM | POA: Diagnosis not present

## 2022-11-21 MED ORDER — ACETAMINOPHEN 325 MG PO TABS
ORAL_TABLET | ORAL | Status: AC
  Administered 2022-11-21: 650 mg via ORAL
  Filled 2022-11-21: qty 2

## 2022-11-21 MED ORDER — SODIUM CHLORIDE 0.9 % IV SOLN
300.0000 mg | Freq: Once | INTRAVENOUS | Status: AC
  Administered 2022-11-21: 300 mg via INTRAVENOUS
  Filled 2022-11-21: qty 300

## 2022-11-21 MED ORDER — LORATADINE 10 MG PO TABS
10.0000 mg | ORAL_TABLET | Freq: Once | ORAL | Status: AC
  Administered 2022-11-21: 10 mg via ORAL
  Filled 2022-11-21: qty 1

## 2022-11-21 MED ORDER — SODIUM CHLORIDE 0.9 % IV SOLN: Freq: Once | INTRAVENOUS | Status: AC

## 2022-11-21 MED ORDER — ACETAMINOPHEN 325 MG PO TABS: 650.0000 mg | ORAL_TABLET | Freq: Once | ORAL | Status: AC

## 2022-11-21 NOTE — Patient Instructions (Signed)

## 2022-11-26 ENCOUNTER — Telehealth: Payer: Self-pay | Admitting: Pharmacist

## 2022-11-26 ENCOUNTER — Other Ambulatory Visit (HOSPITAL_COMMUNITY): Payer: Self-pay

## 2022-11-26 ENCOUNTER — Other Ambulatory Visit: Payer: Self-pay

## 2022-11-26 ENCOUNTER — Encounter: Payer: Self-pay | Admitting: Obstetrics & Gynecology

## 2022-11-26 ENCOUNTER — Ambulatory Visit (INDEPENDENT_AMBULATORY_CARE_PROVIDER_SITE_OTHER): Payer: Medicare HMO | Admitting: Obstetrics & Gynecology

## 2022-11-26 VITALS — BP 120/80 | HR 72 | Resp 16 | Ht 58.25 in | Wt 174.0 lb

## 2022-11-26 DIAGNOSIS — Z780 Asymptomatic menopausal state: Secondary | ICD-10-CM

## 2022-11-26 DIAGNOSIS — M810 Age-related osteoporosis without current pathological fracture: Secondary | ICD-10-CM

## 2022-11-26 DIAGNOSIS — Z01419 Encounter for gynecological examination (general) (routine) without abnormal findings: Secondary | ICD-10-CM

## 2022-11-26 DIAGNOSIS — Z9189 Other specified personal risk factors, not elsewhere classified: Secondary | ICD-10-CM | POA: Diagnosis not present

## 2022-11-26 MED ORDER — DENOSUMAB 60 MG/ML ~~LOC~~ SOSY
60.0000 mg | PREFILLED_SYRINGE | SUBCUTANEOUS | 0 refills | Status: AC
  Filled 2022-11-26: qty 1, 180d supply, fill #0

## 2022-11-26 NOTE — Progress Notes (Signed)
Hannah [name] Nov 15, 1945 [id number]   History:    77 y.o. G1P1L0  Single.  Very active with her sister at [facility].   RP:  Established patient presenting for annual gyn exam    HPI:  Postmenopause. No HRT.  No PMB.  No pelvic pain. Abstinent.  Pap Neg 08/2019.  Breasts wnl. Mammo 04/2022 Neg. Urine/BMs wnl.  BMI stable at 36.05.  Joined weight watcher.  Exercising regularly at the [facility]. Lt knee replacement in 01/2022. Chronic Anemia, seen by Hemato, taking PO Iron. Dermato followed for Vitiligo.  Fam MD Dr Baird Cancer for DM, cHTN, Asthma.  On Eliquis for AFib.  BMD at [facility] 08/2022, will obtain report.  COLONOSCOPY: 2014   Past medical history,surgical history, family history and social history were all reviewed and documented in the EPIC chart.  Gynecologic History No LMP recorded. Patient is postmenopausal.  Obstetric History OB History  Gravida Para Term Preterm AB Living  1 1       0  SAB IAB Ectopic Multiple Live Births               # Outcome Date GA Lbr Len/2nd Weight Sex Delivery Anes PTL Lv  1 Para             Obstetric Comments  Baby died from CHF     ROS: A ROS was performed and pertinent positives and negatives are included in the history. GENERAL: No fevers or chills. HEENT: No change in vision, no earache, sore throat or sinus congestion. NECK: No pain or stiffness. CARDIOVASCULAR: No chest pain or pressure. No palpitations. PULMONARY: No shortness of breath, cough or wheeze. GASTROINTESTINAL: No abdominal pain, nausea, vomiting or diarrhea, melena or bright red blood per rectum. GENITOURINARY: No urinary frequency, urgency, hesitancy or dysuria. MUSCULOSKELETAL: No joint or muscle pain, no back pain, no recent trauma. DERMATOLOGIC: No rash, no itching, no lesions. ENDOCRINE: No polyuria, polydipsia, no heat or cold intolerance. No recent change in weight. HEMATOLOGICAL: No anemia or easy bruising or bleeding. NEUROLOGIC: No headache, seizures, numbness, tingling or  weakness. PSYCHIATRIC: No depression, no loss of interest in normal activity or change in sleep pattern.     Exam:   BP 120/80   Pulse 72   Resp 16   Ht 4' 10.25" (1.48 m)   Wt 174 lb (78.9 kg)   BMI 36.05 kg/m   Body mass index is 36.05 kg/m.  General appearance : Well developed well nourished female. No acute distress HEENT: Eyes: no retinal hemorrhage or exudates,  Neck supple, trachea midline, no carotid bruits, no thyroidmegaly Lungs: Clear to auscultation, no rhonchi or wheezes, or rib retractions  Heart: Regular rate and rhythm, no murmurs or gallops Breast:Examined in sitting and supine position were symmetrical in appearance, no palpable masses or tenderness,  no skin retraction, no nipple inversion, no nipple discharge, no skin discoloration, no axillary or supraclavicular lymphadenopathy Abdomen: no palpable masses or tenderness, no rebound or guarding Extremities: no edema or skin discoloration or tenderness  Pelvic: Vulva: Normal             Vagina: No gross lesions or discharge  Cervix: No gross lesions or discharge  Uterus  AV, normal size, shape and consistency, non-tender and mobile  Adnexa  Without masses or tenderness  Anus: Normal   Assessment/Plan:  77 y.o. female for annual exam   1. Well female exam with routine gynecological exam Postmenopause. No HRT.  No PMB.  No pelvic pain. Abstinent. Pap  Neg 08/2019.  Breasts wnl. Mammo 04/2022 Neg. Urine/BMs wnl.  BMI stable at 36.05.  Joined weight watcher.  Exercising regularly at the [facility]. Lt knee replacement in 01/2022. Chronic Anemia, seen by Hemato, taking PO Iron. Dermato followed for Vitiligo.  Fam MD Dr Baird Cancer for DM, cHTN, Asthma.  On Eliquis for AFib.  BMD at [facility] 08/2022, will obtain report.  COLONOSCOPY: 2014.  2. Postmenopausal Postmenopause. No HRT.  No PMB.  No pelvic pain. Abstinent.   3. Age-related osteoporosis without current pathological fracture BMD at [facility] 08/2022, will obtain report.   Managed by Fam MD.   4. Other specified personal risk factors, not elsewhere classified  Other orders - UNABLE TO FIND; Med Name: iron infusions   Princess Bruins MD, 8:27 AM

## 2022-11-26 NOTE — Telephone Encounter (Signed)
Patient due for Prolia on 12/06/2022. Labs on 11/05/2022 stable.  Per test claim, copay for Prolia is $95. Spoke with patient. Advised no osteoporosis grants have opened. She will pay copay. Rx sent to [facility] today.  Prolia appt scheduled for 12/07/2022.  Knox Saliva, PharmD, MPH, BCPS, CPP Clinical Pharmacist (Rheumatology and Pulmonology)

## 2022-11-27 ENCOUNTER — Other Ambulatory Visit (HOSPITAL_COMMUNITY): Payer: Self-pay

## 2022-11-27 MED FILL — Iron Sucrose Inj 20 MG/ML (Fe Equiv): INTRAVENOUS | Qty: 15 | Status: AC

## 2022-11-28 ENCOUNTER — Other Ambulatory Visit: Payer: Self-pay

## 2022-11-28 ENCOUNTER — Inpatient Hospital Stay: Payer: Medicare HMO | Attending: Hematology

## 2022-11-28 VITALS — BP 133/61 | HR 57 | Temp 98.2°F | Resp 16 | Ht 58.25 in

## 2022-11-28 DIAGNOSIS — D509 Iron deficiency anemia, unspecified: Secondary | ICD-10-CM | POA: Insufficient documentation

## 2022-11-28 DIAGNOSIS — D472 Monoclonal gammopathy: Secondary | ICD-10-CM | POA: Insufficient documentation

## 2022-11-28 DIAGNOSIS — D508 Other iron deficiency anemias: Secondary | ICD-10-CM

## 2022-11-28 DIAGNOSIS — Z79899 Other long term (current) drug therapy: Secondary | ICD-10-CM | POA: Diagnosis not present

## 2022-11-28 MED ORDER — LORATADINE 10 MG PO TABS
10.0000 mg | ORAL_TABLET | Freq: Once | ORAL | Status: AC
  Administered 2022-11-28: 10 mg via ORAL
  Filled 2022-11-28: qty 1

## 2022-11-28 MED ORDER — SODIUM CHLORIDE 0.9 % IV SOLN: Freq: Once | INTRAVENOUS | Status: AC

## 2022-11-28 MED ORDER — ACETAMINOPHEN 325 MG PO TABS
650.0000 mg | ORAL_TABLET | Freq: Once | ORAL | Status: AC
  Administered 2022-11-28: 650 mg via ORAL
  Filled 2022-11-28: qty 2

## 2022-11-28 MED ORDER — SODIUM CHLORIDE 0.9 % IV SOLN
300.0000 mg | Freq: Once | INTRAVENOUS | Status: AC
  Administered 2022-11-28: 300 mg via INTRAVENOUS
  Filled 2022-11-28: qty 300

## 2022-11-28 NOTE — Patient Instructions (Signed)

## 2022-11-29 DIAGNOSIS — I10 Essential (primary) hypertension: Secondary | ICD-10-CM | POA: Diagnosis not present

## 2022-11-29 DIAGNOSIS — R051 Acute cough: Secondary | ICD-10-CM | POA: Diagnosis not present

## 2022-11-29 DIAGNOSIS — Z789 Other specified health status: Secondary | ICD-10-CM | POA: Diagnosis not present

## 2022-11-29 DIAGNOSIS — R0981 Nasal congestion: Secondary | ICD-10-CM | POA: Diagnosis not present

## 2022-11-29 DIAGNOSIS — R067 Sneezing: Secondary | ICD-10-CM | POA: Diagnosis not present

## 2022-11-30 ENCOUNTER — Telehealth: Payer: Self-pay

## 2022-11-30 NOTE — Telephone Encounter (Signed)
Called to discuss next PREP class at [facility] on March 19; she confirms she wants to attend/participate; assessment visit scheduled for March 12 at 10:15

## 2022-12-03 ENCOUNTER — Other Ambulatory Visit: Payer: Self-pay | Admitting: Internal Medicine

## 2022-12-03 NOTE — Telephone Encounter (Signed)
Prolia received from [facility]. Placed in refrigerator  Kemyra August, PharmD, MPH, BCPS, CPP Clinical Pharmacist (Rheumatology and Pulmonology) 

## 2022-12-07 ENCOUNTER — Ambulatory Visit: Payer: Medicare HMO | Attending: Rheumatology | Admitting: Pharmacist

## 2022-12-07 DIAGNOSIS — M810 Age-related osteoporosis without current pathological fracture: Secondary | ICD-10-CM | POA: Diagnosis not present

## 2022-12-07 DIAGNOSIS — Z7689 Persons encountering health services in other specified circumstances: Secondary | ICD-10-CM

## 2022-12-07 MED ORDER — DENOSUMAB 60 MG/ML ~~LOC~~ SOSY
60.0000 mg | PREFILLED_SYRINGE | Freq: Once | SUBCUTANEOUS | Status: AC
  Administered 2022-12-07: 60 mg via SUBCUTANEOUS

## 2022-12-07 NOTE — Progress Notes (Signed)
Pharmacy Note  Subjective:   Patient presents to clinic today to receive bi-annual dose of Prolia. Patient's last dose of Prolia was on 06/09/2022  Patient running a fever or have signs/symptoms of infection? No  Patient currently on antibiotics for the treatment of infection? No  Patient had fall in the last 6 months?  No   Patient taking calcium 1200 mg daily through diet or supplement and at least 800 units vitamin D? Yes  Objective: CMP     Component Value Date/Time   NA 144 11/05/2022 1453   NA 141 07/29/2022 1546   NA 142 08/12/2017 0808   K 4.0 11/05/2022 1453   K 3.7 08/12/2017 0808   CL 108 11/05/2022 1453   CO2 27 11/05/2022 1453   CO2 27 08/12/2017 0808   GLUCOSE 105 (H) 11/05/2022 1453   GLUCOSE 85 08/12/2017 0808   BUN 17 11/05/2022 1453   BUN 12 07/29/2022 1546   BUN 20.8 08/12/2017 0808   CREATININE 1.07 (H) 11/05/2022 1453   CREATININE 1.4 (H) 08/12/2017 0808   CALCIUM 9.5 11/05/2022 1453   CALCIUM 10.3 08/12/2017 0808   PROT 6.6 11/05/2022 1453   PROT 6.7 12/31/2021 1144   PROT 6.8 08/12/2017 0808   ALBUMIN 3.7 10/28/2022 0847   ALBUMIN 4.3 12/31/2021 1144   ALBUMIN 3.7 08/12/2017 0808   AST 22 11/05/2022 1453   AST 23 10/28/2022 0847   AST 31 08/12/2017 0808   ALT 14 11/05/2022 1453   ALT 13 10/28/2022 0847   ALT 38 08/12/2017 0808   ALKPHOS 52 10/28/2022 0847   ALKPHOS 51 08/12/2017 0808   BILITOT 0.4 11/05/2022 1453   BILITOT 0.4 10/28/2022 0847   BILITOT 0.45 08/12/2017 0808   GFRNONAA 52 (L) 10/28/2022 0847   GFRNONAA 38 (L) 12/19/2020 1426   GFRAA 44 (L) 12/19/2020 1426    CBC    Component Value Date/Time   WBC 5.0 11/05/2022 1453   RBC 3.05 (L) 11/05/2022 1453   HGB 9.8 (L) 11/05/2022 1453   HGB 9.9 (L) 10/28/2022 0847   HGB 10.6 (L) 12/31/2021 1144   HGB 9.9 (L) 08/12/2017 0808   HCT 29.0 (L) 11/05/2022 1453   HCT 31.9 (L) 12/31/2021 1144   HCT 30.7 (L) 08/12/2017 0808   PLT 189 11/05/2022 1453   PLT 174 10/28/2022 0847    PLT 145 (L) 12/31/2021 1144   MCV 95.1 11/05/2022 1453   MCV 94 12/31/2021 1144   MCV 97.5 08/12/2017 0808   MCH 32.1 11/05/2022 1453   MCHC 33.8 11/05/2022 1453   RDW 13.3 11/05/2022 1453   RDW 13.1 12/31/2021 1144   RDW 14.0 08/12/2017 0808   LYMPHSABS 1,170 11/05/2022 1453   LYMPHSABS 0.8 04/08/2021 1629   LYMPHSABS 0.9 08/12/2017 0808   MONOABS 0.5 10/28/2022 0847   MONOABS 0.3 08/12/2017 0808   EOSABS 435 11/05/2022 1453   EOSABS 0.2 04/08/2021 1629   BASOSABS 50 11/05/2022 1453   BASOSABS 0.0 04/08/2021 1629   BASOSABS 0.0 08/12/2017 0808    Lab Results  Component Value Date   VD25OH 54 11/05/2022   T-score: DEXA 09/03/22: RFN T-score -2.4 BMD 0.581  Assessment/Plan:   Reviewed importance of adequate dietary intake of calcium in addition to supplementation due to risk of hypocalcemia with Prolia.   Patient tolerated injection without issue.  Administrations This Visit     denosumab (PROLIA) injection 60 mg     Admin Date 12/07/2022 Action Given Dose 60 mg Route Subcutaneous Administered By Wilhemina Bonito,  Lauris Keepers S, RPH-CPP           Patient's next Prolia dose is due on 06/05/2023.  Patient is due for updated DEXA in December 2023.   All questions encouraged and answered.  Instructed patient to call with any further questions or concerns.   Knox Saliva, PharmD, MPH, BCPS, CPP Clinical Pharmacist (Rheumatology and Pulmonology)

## 2022-12-08 NOTE — Progress Notes (Signed)
[facility] PREP Evaluation  Patient Details  Name: Hannah [name] MRN: [id number] Date of Birth: 30-Jun-1946 Age: 77 y.o. PCP: Hannah Chard, MD  Vitals:   12/08/22 1042  BP: (!) 160/62  Pulse: 67  SpO2: 99%  Weight: 175 lb 6.4 oz (79.6 kg)     [facility] Eval - 12/08/22 1000       [facility] "PREP" Location   [facility] "PREP" Location [facility]      Referral    Referring Provider Hannah [name]    Reason for referral Hypertension;Obesitity/Overweight;Orthopedic    Program Start Date 12/15/22      Measurement   Waist Circumference 42.5 inches    Hip Circumference 48 inches    Body fat 46.6 percent      Information for Trainer   Goals --   Manage intake of sugar/sweets; lose 10 pounds by end of program   Current Exercise --   walking, [facility]akers [facility]   Orthopedic Concerns --   s/p L TKA, needs R TKA ; OA and RA   Pertinent Medical History --   HTN, A fib, pre diabetes   Restrictions/Precautions Assistive device   cane   Medications that affect exercise Beta blocker      Timed Up and Go (TUGS)   Timed Up and Go Moderate risk 10-12 seconds      Mobility and Daily Activities   I find it easy to walk up or down two or more flights of stairs. 2    I have no trouble taking out the trash. 4    I do housework such as vacuuming and dusting on my own without difficulty. 4    I can easily lift a gallon of milk (8lbs). 4    I can easily walk a mile. 2    I have no trouble reaching into high cupboards or reaching down to pick up something from the floor. 2    I do not have trouble doing out-door work such as Armed forces logistics/support/administrative officer, raking leaves, or gardening. 4      Mobility and Daily Activities   I feel younger than my age. 4    I feel independent. 4    I feel energetic. 4    I live an active life.  3    I feel strong. 3    I feel healthy. 2    I feel active as other people my age. 4      How fit and strong are you.   Fit and Strong Total Score 46            Past Medical  History:  Diagnosis Date   Anemia    Arthritis    Asthma    Atrial fibrillation ([place])    Chronic kidney disease    Diabetes mellitus without complication ([place])    Gout 12/17/2014   patient reported   Hyperlipidemia    Hypertension    Hypothyroidism    Systemic lupus erythematosus ([place])    Vitiligo    Past Surgical History:  Procedure Laterality Date   CATARACT EXTRACTION Bilateral 2015   DILATION AND CURETTAGE OF UTERUS     DOPPLER ECHOCARDIOGRAPHY  05/2018   Internist to review with pt; potential heart murmur 06/20/18   keratosis removal  2021   KNEE CLOSED REDUCTION Left 07/06/2022   Procedure: CLOSED MANIPULATION KNEE;  Surgeon: Hannah Arabian, MD;  Location: [facility];  Service: Orthopedics;  Laterality: Left;   REPLACEMENT TOTAL KNEE  Left    SKIN SURGERY  11/30/2018   left side of face   TOOTH EXTRACTION     TOTAL KNEE ARTHROPLASTY Left 02/02/2022   Procedure: TOTAL KNEE ARTHROPLASTY;  Surgeon: Hannah Arabian, MD;  Location: [facility];  Service: Orthopedics;  Laterality: Left;   Social History   Tobacco Use  Smoking Status Never   Passive exposure: Never  Smokeless Tobacco Never  To begin PREP class at [facility] starting March 19, every T/Th 10-11:15  Hannah [name] 12/08/2022, 10:46 AM

## 2022-12-15 NOTE — Progress Notes (Signed)
[facility] PREP Weekly Session  Patient Details  Name: Hannah [name] MRN: [id number] Date of Birth: 1946/04/20 Age: 77 y.o. PCP: Glendale Chard, MD  There were no vitals filed for this visit.   [facility] Weekly seesion - 12/15/22 1100       [facility] "PREP" Location   [facility] "PREP" Location [facility]      Weekly Session   Topic Discussed Goal setting and welcome to the program   Introductions, review of notebook, tour of facilty, option to work out on cardio machine   Classes attended to date 1             [place] 12/15/2022, 11:46 AM

## 2022-12-17 ENCOUNTER — Telehealth: Payer: Self-pay | Admitting: Cardiovascular Disease

## 2022-12-17 NOTE — Telephone Encounter (Signed)
Pt c/o medication issue:  1. Name of Medication:   apixaban (ELIQUIS) 5 MG TABS tablet   2. How are you currently taking this medication (dosage and times per day)?   As prescribed  3. Are you having a reaction (difficulty breathing--STAT)?   4. What is your medication issue?   Patient stated she has 20 tablets left.  Patient stated [facility] told her they are missing information from her application (either signature or date) and they will need that information added and re-faxed to fax# [phone] to complete patient's application.  Patient wants a call back to confirm completed application has been faxed.

## 2022-12-18 NOTE — Telephone Encounter (Signed)
Printed, signed, faxed, and confirmation received. 

## 2022-12-18 NOTE — Telephone Encounter (Signed)
**Note De-Identified Hannah [name] Obfuscation** I called [employer] and s/w Maggie who advised me that Dr Johnsie Cancel (was DOD on 08/24/2022) did not date the providers page of the pts application and that they need the pts 2024 out of pocket RX expenses report as she submitted her 2023 Rx expense report with her application.  I called the pt with update on her Eliquis application. She is aware that we are faxing another providers page to [facility] today. She states that she will bring her 2024 out of pocket RX expenses report to the office to drop off either today or on Monday 3/25 and she is aware that I will fax to [employer] once I receive it from her.  I have completed another providers page and have emailed it to Dr Elmarie Shiley nurse so she can obtain his signature, date it, and to then fax to [facility] at the fax number written on the cover letter included.

## 2022-12-21 ENCOUNTER — Telehealth: Payer: Self-pay | Admitting: Cardiovascular Disease

## 2022-12-21 NOTE — Telephone Encounter (Signed)
Pt is calling regarding patient assistance for Eliquis.  She has 6 days left and will need some until she hears back from P/A.    I will route to [employer], LPN and Pharm D.

## 2022-12-21 NOTE — Telephone Encounter (Signed)
Good morning I have some patient assistance paperwork that need to be picked up from the front desk. Thank you and see you soon!

## 2022-12-21 NOTE — Telephone Encounter (Signed)
**Note De-Identified Hannah [name] Obfuscation** See phone note from 3/21 for more details. The pt states that she left her out of pocket RX expenses report at the office this morning. I advised her that we will fax it to [facility] and I asked her to call me back Thursday afternoon concerning samples as I want an update from [employer] before then to be sure nothing else is needed.  She verbalized understanding and thanked me for calling her back.

## 2022-12-22 NOTE — Telephone Encounter (Signed)
**Note De-Identified Hannah [name] Obfuscation** I have faxed the pts proof of income and her out of pocket RX expenses report to [employer]. Fax: Tx 'ok' Report CONE_EMAIL-to-Fax  Aeric Burnham, Mardene Celeste This message was sent Shaquon Gropp [facility], a product from [employer] http://www.biscom.com/ [employer] pioneered [employer] and continues to innovate the most advanced and intelligent fax and secure messaging solutions for enterprises. www.biscom.com -------Fax Transmission Report-------  To:               Recipient at [id number] Subject:          Fw: [employer]/Eliquis Assistance Result:           The transmission was successful. Explanation:      All Pages Ok Pages Sent:       8 Connect Time:     5 minutes, 41 seconds Transmit Time:    12/22/2022 13:18 Transfer Rate:    14400 Status Code:      0000 Retry Count:      0 Job Id:           [id number] Unique [id number] Fax Line:         22 Fax Server:       [employer]

## 2022-12-22 NOTE — Progress Notes (Signed)
[facility] PREP Weekly Session  Patient Details  Name: Hannah [name] MRN: [id number] Date of Birth: April 15, 1946 Age: 77 y.o. PCP: Glendale Chard, MD  There were no vitals filed for this visit.   [facility] Weekly seesion - 12/22/22 1100       [facility] "PREP" Location   [facility] "PREP" Location [facility]      Weekly Session   Topic Discussed Importance of resistance training;Other ways to be active   Sitting no more than 30 minutes: cardio work up to 150 min/wk; strength training 2-3 times a week for 20-40 minutes   Minutes exercised this week 50 minutes    Classes attended to date [place] 12/22/2022, 11:34 AM

## 2022-12-23 MED ORDER — APIXABAN 5 MG PO TABS: 5.0000 mg | ORAL_TABLET | Freq: Two times a day (BID) | ORAL | 1 refills | Status: DC

## 2022-12-23 NOTE — Telephone Encounter (Addendum)
**Note De-Identified [name]** Letter received Hannah [name] fax from [facility] stating that they have denied the pt Hannah [name] assistance. Reason for denial: The pts household income is greater than current eligibility criteria. Documentation of 3% out of pocket RX expenses, based on household adjusted gross income, not met. [id number]  I have notified the pt of this denial.  She states that she only has a couple days of Hannah [name] left and that she needs time to work on what she wants to do going forward as we did discuss switching to Warfarin.  She wants to remain on Hannah [name] so she states she is calling her insurance plan, her pharmacy, and [employer] to find out if there is anything else she can do to afford Hannah [name].  I advised her that we will leave her 1 more week of Hannah [name] 5 mg samples in the front office for her to pick up and take until she gets this worked out. She thanked me for our assistance.

## 2022-12-23 NOTE — Telephone Encounter (Signed)
Leaving pt 1 box of Eliquis 5 mg tablets at [facility] front desk for pt to pick up.  Lot#  [id number]   Exp: 02/2024   FYI

## 2022-12-23 NOTE — Telephone Encounter (Signed)
Pt called in to speak with you again, pt states she will not be home until 4pm   " I called the company regarding the scholarship and they are going to send it back through. They had my income higher than what it actually is."

## 2022-12-24 DIAGNOSIS — H3581 Retinal edema: Secondary | ICD-10-CM | POA: Diagnosis not present

## 2022-12-24 DIAGNOSIS — M329 Systemic lupus erythematosus, unspecified: Secondary | ICD-10-CM | POA: Diagnosis not present

## 2022-12-24 DIAGNOSIS — H04123 Dry eye syndrome of bilateral lacrimal glands: Secondary | ICD-10-CM | POA: Diagnosis not present

## 2022-12-24 DIAGNOSIS — H26493 Other secondary cataract, bilateral: Secondary | ICD-10-CM | POA: Diagnosis not present

## 2022-12-24 DIAGNOSIS — Z79899 Other long term (current) drug therapy: Secondary | ICD-10-CM | POA: Diagnosis not present

## 2022-12-24 DIAGNOSIS — E119 Type 2 diabetes mellitus without complications: Secondary | ICD-10-CM | POA: Diagnosis not present

## 2022-12-24 DIAGNOSIS — Z961 Presence of intraocular lens: Secondary | ICD-10-CM | POA: Diagnosis not present

## 2022-12-24 LAB — HM DIABETES EYE EXAM

## 2022-12-29 ENCOUNTER — Telehealth: Payer: Self-pay | Admitting: Cardiovascular Disease

## 2022-12-29 NOTE — Telephone Encounter (Signed)
Pt called stating that her Eliquis was approved for patient assistance, once they reevaluated her income amount. Pt would like a callback

## 2022-12-29 NOTE — Progress Notes (Signed)
[facility] PREP Weekly Session  Patient Details  Name: Hannah [name] MRN: [id number] Date of Birth: Aug 14, 1946 Age: 77 y.o. PCP: Glendale Chard, MD  There were no vitals filed for this visit.   [facility] Weekly seesion - 12/29/22 1200       [facility] "PREP" Location   [facility] "PREP" Location [facility]      Weekly Session   Topic Discussed Healthy eating tips   Foods to reduce, foods to increase; introduced [employer] app   Minutes exercised this week 120 minutes    Classes attended to date [place] 12/29/2022, 12:34 PM

## 2022-12-30 NOTE — Telephone Encounter (Signed)
**Note De-Identified Hannah [name] Obfuscation** No answer. I left a message on her VM asking her to call [name] back at [facility] at [phone].

## 2022-12-30 NOTE — Telephone Encounter (Signed)
**Note De-Identified Hannah [name] Obfuscation** The pt states that she did call [employer] back as I recommended that she do and she found that they did a income search on her and had her income amount way more than she what she gets.   She states that they did approve her for Eliquis assistance until 09/28/2023 and that she did receive her Eliquis shipment yesterday morning.  She thanked me multiple times for my assistance throughout her application process.

## 2022-12-31 DIAGNOSIS — M1711 Unilateral primary osteoarthritis, right knee: Secondary | ICD-10-CM | POA: Diagnosis not present

## 2022-12-31 NOTE — Telephone Encounter (Signed)
**Note De-Identified Raeanna Soberanes Obfuscation** Letter received from [facility] Aneliese Beaudry fax stating that the pt has been approved for Eliquis assistance until 09/28/2023. [id number]. The pt is aware of her approval.

## 2023-01-01 DIAGNOSIS — M329 Systemic lupus erythematosus, unspecified: Secondary | ICD-10-CM | POA: Diagnosis not present

## 2023-01-01 DIAGNOSIS — N390 Urinary tract infection, site not specified: Secondary | ICD-10-CM | POA: Diagnosis not present

## 2023-01-01 DIAGNOSIS — I129 Hypertensive chronic kidney disease with stage 1 through stage 4 chronic kidney disease, or unspecified chronic kidney disease: Secondary | ICD-10-CM | POA: Diagnosis not present

## 2023-01-01 DIAGNOSIS — D472 Monoclonal gammopathy: Secondary | ICD-10-CM | POA: Diagnosis not present

## 2023-01-01 DIAGNOSIS — N183 Chronic kidney disease, stage 3 unspecified: Secondary | ICD-10-CM | POA: Diagnosis not present

## 2023-01-01 DIAGNOSIS — E1122 Type 2 diabetes mellitus with diabetic chronic kidney disease: Secondary | ICD-10-CM | POA: Diagnosis not present

## 2023-01-01 DIAGNOSIS — N2581 Secondary hyperparathyroidism of renal origin: Secondary | ICD-10-CM | POA: Diagnosis not present

## 2023-01-01 DIAGNOSIS — D631 Anemia in chronic kidney disease: Secondary | ICD-10-CM | POA: Diagnosis not present

## 2023-01-01 LAB — BASIC METABOLIC PANEL
BUN: 26 — AB (ref 4–21)
CO2: 27 — AB (ref 13–22)
Chloride: 103 (ref 99–108)
Creatinine: 1.3 — AB (ref 0.5–1.1)
Glucose: 103
Potassium: 4.5 mEq/L (ref 3.5–5.1)
Sodium: 140 (ref 137–147)

## 2023-01-01 LAB — CBC: RBC: 3.09 — AB (ref 3.87–5.11)

## 2023-01-01 LAB — COMPREHENSIVE METABOLIC PANEL
Albumin: 4.1 (ref 3.5–5.0)
Calcium: 9.3 (ref 8.7–10.7)
eGFR: 42

## 2023-01-01 LAB — CBC AND DIFFERENTIAL
HCT: 29 — AB (ref 36–46)
Hemoglobin: 9.7 — AB (ref 12.0–16.0)
WBC: 10.7

## 2023-01-01 LAB — HEPATIC FUNCTION PANEL
ALT: 17 U/L (ref 7–35)
AST: 23 (ref 13–35)
Alkaline Phosphatase: 59 (ref 25–125)
Bilirubin, Total: 0.3

## 2023-01-02 LAB — LAB REPORT - SCANNED
Albumin, Urine POC: 159.5
EGFR: 42
Microalb Creat Ratio: 74

## 2023-01-05 NOTE — Progress Notes (Signed)
[facility] PREP Weekly Session  Patient Details  Name: Hannah [name] MRN: [id number] Date of Birth: 26-Jun-1946 Age: 77 y.o. PCP: Dorothyann Peng, MD  There were no vitals filed for this visit.   [facility] Weekly seesion - 01/05/23 1300       [facility] "PREP" Location   [facility] "PREP" Location [facility]      Weekly Session   Topic Discussed Health habits   Sugar demo   Minutes exercised this week 300 minutes    Classes attended to date 6             Lott Seelbach B Delara Shepheard 01/05/2023, 1:13 PM

## 2023-01-07 ENCOUNTER — Other Ambulatory Visit: Payer: Self-pay | Admitting: Internal Medicine

## 2023-01-09 ENCOUNTER — Other Ambulatory Visit: Payer: Self-pay | Admitting: Internal Medicine

## 2023-01-12 ENCOUNTER — Encounter: Payer: Self-pay | Admitting: Internal Medicine

## 2023-01-12 ENCOUNTER — Ambulatory Visit (INDEPENDENT_AMBULATORY_CARE_PROVIDER_SITE_OTHER): Payer: Medicare HMO | Admitting: Internal Medicine

## 2023-01-12 VITALS — BP 150/84 | HR 94 | Temp 98.1°F | Ht <= 58 in | Wt 172.2 lb

## 2023-01-12 DIAGNOSIS — D638 Anemia in other chronic diseases classified elsewhere: Secondary | ICD-10-CM | POA: Diagnosis not present

## 2023-01-12 DIAGNOSIS — D6869 Other thrombophilia: Secondary | ICD-10-CM | POA: Insufficient documentation

## 2023-01-12 DIAGNOSIS — J4520 Mild intermittent asthma, uncomplicated: Secondary | ICD-10-CM

## 2023-01-12 DIAGNOSIS — M545 Low back pain, unspecified: Secondary | ICD-10-CM

## 2023-01-12 DIAGNOSIS — G8929 Other chronic pain: Secondary | ICD-10-CM

## 2023-01-12 DIAGNOSIS — Z0000 Encounter for general adult medical examination without abnormal findings: Secondary | ICD-10-CM

## 2023-01-12 DIAGNOSIS — I1310 Hypertensive heart and chronic kidney disease without heart failure, with stage 1 through stage 4 chronic kidney disease, or unspecified chronic kidney disease: Secondary | ICD-10-CM

## 2023-01-12 DIAGNOSIS — E1122 Type 2 diabetes mellitus with diabetic chronic kidney disease: Secondary | ICD-10-CM

## 2023-01-12 DIAGNOSIS — N1831 Chronic kidney disease, stage 3a: Secondary | ICD-10-CM | POA: Diagnosis not present

## 2023-01-12 DIAGNOSIS — Z6835 Body mass index (BMI) 35.0-35.9, adult: Secondary | ICD-10-CM

## 2023-01-12 LAB — POCT URINALYSIS DIPSTICK
Bilirubin, UA: NEGATIVE
Blood, UA: NEGATIVE
Glucose, UA: POSITIVE — AB
Ketones, UA: NEGATIVE
Leukocytes, UA: NEGATIVE
Nitrite, UA: NEGATIVE
Protein, UA: POSITIVE — AB
Spec Grav, UA: 1.025 (ref 1.010–1.025)
Urobilinogen, UA: 0.2 E.U./dL
pH, UA: 7 (ref 5.0–8.0)

## 2023-01-12 NOTE — Patient Instructions (Signed)

## 2023-01-12 NOTE — Progress Notes (Signed)
I,Hannah [name],acting as a scribe for Hannah Aliment, MD.,have documented all relevant documentation on the behalf of Hannah Aliment, MD,as directed by  Hannah Aliment, MD while in the presence of Hannah Aliment, MD.   Subjective:     Patient ID: Hannah [name] , female    DOB: 1946/02/07 , 77 y.o.   MRN: [id number]   Chief Complaint  Patient presents with  . Annual Exam  . Diabetes  . Hypertension    HPI  She is here today for a full physical exam. She has her GYN exams performed by Dr. Seymour [name]. She reports compliance with her medication regimen. She denies headaches, chest pain and palpitations. She states she saw kidney specialist 2 weeks ago. Jardiance  was added, she has not had any issues with this medication. She is to follow up with him in a month for blood work.   Patient has had a recent EKG: 09/17/2022 with cardiologist.   Diabetes She presents for her follow-up diabetic visit. She has type 2 diabetes mellitus. Her disease course has been stable. There are no hypoglycemic associated symptoms. Pertinent negatives for hypoglycemia include no headaches. There are no diabetic associated symptoms. Pertinent negatives for diabetes include no blurred vision and no chest pain. There are no hypoglycemic complications. Diabetic complications include nephropathy. Risk factors for coronary artery disease include diabetes mellitus, dyslipidemia, hypertension, post-menopausal and sedentary lifestyle. She is following a diabetic diet. She participates in exercise three times a week. An ACE inhibitor/angiotensin II receptor blocker is being taken.  Hypertension This is a chronic problem. The current episode started more than 1 year ago. The problem has been gradually improving since onset. The problem is controlled. Pertinent negatives include no blurred vision, chest pain or headaches. Risk factors for coronary artery disease include diabetes mellitus, obesity and  post-menopausal state. Past treatments include angiotensin blockers. The current treatment provides moderate improvement. Hypertensive end-organ damage includes kidney disease.     Past Medical History:  Diagnosis Date  . Anemia   . Arthritis   . Asthma   . Atrial fibrillation   . Chronic kidney disease   . Diabetes mellitus without complication   . Gout 12/17/2014   patient reported  . Hyperlipidemia   . Hypertension   . Hypothyroidism   . Systemic lupus erythematosus   . Vitiligo      Family History  Problem Relation Age of Onset  . Heart disease Father   . Diabetes Father   . Hypertension Mother   . Hypertension Sister   . Hypertension Sister   . Leukemia Sister      Current Outpatient Medications:  .  Accu-Chek Softclix Lancets lancets, Use to check blood sugars daily E11.69, Disp: 100 each, Rfl: 2 .  albuterol (PROVENTIL HFA;VENTOLIN HFA) 108 (90 Base) MCG/ACT inhaler, Inhale 2 puffs into the lungs every 6 (six) hours as needed for wheezing or shortness of breath., Disp: 1 Inhaler, Rfl: 2 .  Alcohol Swabs (ALCOHOL PADS) 70 % PADS, Use as directed to check blood sugars 1 time per day dx: e11.22, Disp: 150 each, Rfl: 2 .  allopurinol (ZYLOPRIM) 100 MG tablet, Take 1 tablet (100 mg total) by mouth 3 (three) times a week for 90 doses., Disp: 90 tablet, Rfl: 2 .  amLODipine (NORVASC) 2.5 MG tablet, TAKE 1 TABLET(2.5 MG) BY MOUTH DAILY, Disp: 30 tablet, Rfl: 2 .  apixaban (ELIQUIS) 5 MG TABS tablet, Take 1 tablet (5 mg total) by mouth 2 (two)  times daily., Disp: 180 tablet, Rfl: 1 .  Ascorbic Acid (VITAMIN C) 1000 MG tablet, Take 1,000 mg by mouth daily., Disp: , Rfl:  .  atorvastatin (LIPITOR) 80 MG tablet, TAKE 1 TABLET BY MOUTH EVERY DAY, Disp: 90 tablet, Rfl: 1 .  budesonide-formoterol (SYMBICORT) 160-4.5 MCG/ACT inhaler, INHALE 2 PUFFS BY MOUTH TWICE DAILY IN THE MORNING AND IN THE EVENING, Disp: 10.2 g, Rfl: 6 .  Calcium Carb-Cholecalciferol 600-800 MG-UNIT TABS, Take 1  tablet by mouth daily. , Disp: , Rfl:  .  Cholecalciferol (VITAMIN D PO), Take 2,000 Units by mouth daily., Disp: , Rfl:  .  cyanocobalamin 1000 MCG tablet, Take 1,000 mcg by mouth daily., Disp: , Rfl:  .  denosumab (PROLIA) 60 MG/ML SOSY injection, Inject 60 mg into the skin every 6 (six) months. Courier to rheum: [address], [address], [place] [address]. Appt on 12/07/2022, Disp: 1 mL, Rfl: 0 .  glucose blood (ACCU-CHEK GUIDE) test strip, Use to check blood sugars daily E11.69, Disp: 100 each, Rfl: 2 .  hydroxychloroquine (PLAQUENIL) 200 MG tablet, TAKE 1 TABLET(200 MG) BY MOUTH DAILY TWICE DAILY MONDAY THROUGH FRIDAY ONLY., Disp: 180 tablet, Rfl: 0 .  Hypromellose (ARTIFICIAL TEARS OP), Apply 1 drop to eye daily as needed (dry eyes)., Disp: , Rfl:  .  irbesartan (AVAPRO) 300 MG tablet, TAKE 1 TABLET(300 MG) BY MOUTH DAILY, Disp: 90 tablet, Rfl: 2 .  JARDIANCE 10 MG TABS tablet, Take 10 mg by mouth daily., Disp: , Rfl:  .  metoprolol succinate (TOPROL-XL) 25 MG 24 hr tablet, TAKE 1 TABLET(25 MG) BY MOUTH DAILY, Disp: 90 tablet, Rfl: 1 .  SYNTHROID 88 MCG tablet, TAKE 1 TABLET BY MOUTH EVERY DAY MONDAY TO SATURDAY AND OFF ON SUNDAYS, Disp: 90 tablet, Rfl: 0 .  UNABLE TO FIND, Med Name: iron infusions, Disp: , Rfl:  .  valsartan-hydrochlorothiazide (DIOVAN-HCT) 160-25 MG tablet, Take 1 tablet by mouth daily., Disp: , Rfl:    Allergies  Allergen Reactions  . Shellfish Allergy Anaphylaxis and Hives      The patient states she uses post menopausal status for birth control. Last LMP was No LMP recorded. Patient is postmenopausal.. Negative for Dysmenorrhea. Negative for: breast discharge, breast lump(s), breast pain and breast self exam. Associated symptoms include abnormal vaginal bleeding. Pertinent negatives include abnormal bleeding (hematology), anxiety, decreased libido, depression, difficulty falling sleep, dyspareunia, history of infertility, nocturia, sexual dysfunction, sleep  disturbances, urinary incontinence, urinary urgency, vaginal discharge and vaginal itching. Diet regular.The patient states her exercise level is    . The patient's tobacco use is:  Social History   Tobacco Use  Smoking Status Never  . Passive exposure: Never  Smokeless Tobacco Never  . She has been exposed to passive smoke. The patient's alcohol use is:  Social History   Substance and Sexual Activity  Alcohol Use Not Currently    Review of Systems  Constitutional: Negative.   HENT: Negative.    Eyes: Negative.  Negative for blurred vision.  Respiratory: Negative.    Cardiovascular: Negative.  Negative for chest pain.  Gastrointestinal: Negative.   Endocrine: Negative.   Genitourinary: Negative.   Musculoskeletal: Negative.   Skin: Negative.   Allergic/Immunologic: Negative.   Neurological: Negative.  Negative for headaches.  Hematological: Negative.   Psychiatric/Behavioral: Negative.       Today's Vitals   01/12/23 1041  BP: (!) 150/84  Pulse: 94  Temp: 98.1 F (36.7 C)  Weight: 172 lb 3.2 oz (78.1 kg)  Height: 4\' 10"  (1.473 m)   Body mass index is 35.99 kg/m.   Objective:  Physical Exam Vitals and nursing note reviewed.  Constitutional:      Appearance: Normal appearance.  HENT:     Head: Normocephalic and atraumatic.     Right Ear: Tympanic membrane, ear canal and external ear normal.     Left Ear: Tympanic membrane, ear canal and external ear normal.     Nose: Nose normal.     Mouth/Throat:     Mouth: Mucous membranes are moist.     Pharynx: Oropharynx is clear.  Eyes:     Extraocular Movements: Extraocular movements intact.     Conjunctiva/sclera: Conjunctivae normal.     Pupils: Pupils are equal, round, and reactive to light.  Cardiovascular:     Rate and Rhythm: Normal rate and regular rhythm.     Pulses: Normal pulses.     Heart sounds: Normal heart sounds.  Pulmonary:     Effort: Pulmonary effort is normal.     Breath sounds: Normal breath  sounds.  Abdominal:     General: Abdomen is flat. Bowel sounds are normal.     Palpations: Abdomen is soft.  Genitourinary:    Comments: deferred Musculoskeletal:        General: Normal range of motion.     Cervical back: Normal range of motion and neck supple.  Feet:     Right foot:     Protective Sensation: 5 sites tested.  5 sites sensed.     Left foot:     Protective Sensation: 5 sites tested.  5 sites sensed.  Skin:    General: Skin is warm and dry.  Neurological:     General: No focal deficit present.     Mental Status: She is alert and oriented to person, place, and time.  Psychiatric:        Mood and Affect: Mood normal.        Behavior: Behavior normal.    Assessment And Plan:     1. Encounter for general adult medical examination w/o abnormal findings Comments: A full exam was performed. Importance of monthly self breast exams was discussed with the patient.  2. Type 2 diabetes mellitus with stage 3a chronic kidney disease, without long-term current use of insulin Comments: Chronic, diabetic foot exam was performed. She is now on Jardiance, started by Renal. She will rto in 4 months for re-evaluation. - POCT Urinalysis Dipstick (81002) - Microalbumin / creatinine urine ratio - EKG 12-Lead - Hemoglobin A1c - Lipid panel  3. Hypertensive heart and renal disease with renal failure, stage 1 through stage 4 or unspecified chronic kidney disease, without heart failure Comments: Chronic, uncontrolled.  4. Mild intermittent asthma without complication - Iron, TIBC and Ferritin Panel  5. Chronic bilateral low back pain without sciatica  6. Acquired thrombophilia Comments: She will c/w Eliquis due to PAF.   Patient was given opportunity to ask questions. Patient verbalized understanding of the plan and was able to repeat key elements of the plan. All questions were answered to their satisfaction.   I, Hannah Aliment, MD, have reviewed all documentation for this  visit. The documentation on 01/12/23 for the exam, diagnosis, procedures, and orders are all accurate and complete.   THE PATIENT IS ENCOURAGED TO PRACTICE SOCIAL DISTANCING DUE TO THE COVID-19 PANDEMIC.

## 2023-01-13 ENCOUNTER — Encounter: Payer: Self-pay | Admitting: Hematology

## 2023-01-13 ENCOUNTER — Telehealth: Payer: Self-pay

## 2023-01-13 LAB — LIPID PANEL
Chol/HDL Ratio: 2 ratio (ref 0.0–4.4)
Cholesterol, Total: 131 mg/dL (ref 100–199)
HDL: 66 mg/dL (ref >39)
LDL Chol Calc (NIH): 52 mg/dL (ref 0–99)
Triglycerides: 63 mg/dL (ref 0–149)
VLDL Cholesterol Cal: 13 mg/dL (ref 5–40)

## 2023-01-13 LAB — HEMOGLOBIN A1C
Est. average glucose Bld gHb Est-mCnc: 108 mg/dL
Hgb A1c MFr Bld: 5.4 % (ref 4.8–5.6)

## 2023-01-13 LAB — IRON,TIBC AND FERRITIN PANEL
Ferritin: 271 ng/mL — ABNORMAL HIGH (ref 15–150)
Iron Saturation: 22 % (ref 15–55)
Iron: 52 ug/dL (ref 27–139)
Total Iron Binding Capacity: 238 ug/dL — ABNORMAL LOW (ref 250–450)
UIBC: 186 ug/dL (ref 118–369)

## 2023-01-13 NOTE — Telephone Encounter (Signed)
Patient contacted the office and states her insurance, [place], contacted her about alternatives to her Prolia that would cost her less money. The patient states she would be interested in the alternatives which are Alendronate and Ibandronate. Patient states [employer] was supposed to fax [place] more information. Patient's call back number is [phone]. Patient states if she does not answer, then a voice message can be left. Please advise.

## 2023-01-13 NOTE — Telephone Encounter (Signed)
Patient has been treated with alendronate in the past for unknown duration and was switched to Prolia. I discussed with patient that she was previously on alendronate in the past but appeared to not providing significant clinical benefit for her osteoporosis based on T-score. She verbalized understanding. She states she was simply investigating other options if needed. She appreciated f/u call  Chesley Mires, PharmD, MPH, BCPS, CPP Clinical Pharmacist (Rheumatology and Pulmonology)

## 2023-01-14 LAB — MICROALBUMIN / CREATININE URINE RATIO
Creatinine, Urine: 113.9 mg/dL
Microalb/Creat Ratio: 61 mg/g creat — ABNORMAL HIGH (ref 0–29)
Microalbumin, Urine: 69.5 ug/mL

## 2023-01-18 ENCOUNTER — Other Ambulatory Visit: Payer: Self-pay | Admitting: Internal Medicine

## 2023-01-19 NOTE — Progress Notes (Signed)
[facility] PREP Weekly Session  Patient Details  Name: Hannah [name] MRN: [id number] Date of Birth: Jun 14, 1946 Age: 77 y.o. PCP: Dorothyann Peng, MD  Vitals:   01/19/23 1101  Weight: 172 lb (78 kg)     [facility] Weekly seesion - 01/19/23 1100       [facility] "PREP" Location   [facility] "PREP" Location [facility]      Weekly Session   Topic Discussed Stress management and problem solving   finger tip mudra breathwork; importance of sleep: 7-9 hrs/night   Minutes exercised this week 420 minutes    Classes attended to date 58             Cha Gomillion B Rochelle Larue 01/19/2023, 11:02 AM

## 2023-01-20 ENCOUNTER — Ambulatory Visit: Payer: Self-pay

## 2023-01-20 NOTE — Addendum Note (Signed)
Addended by: Randa Lynn T on: 01/20/2023 10:36 AM   Modules accepted: Orders

## 2023-01-20 NOTE — Patient Outreach (Signed)
  Care Coordination   01/20/2023 Name: Hannah [name] MRN: [id number] DOB: 09/20/1946   Care Coordination Outreach Attempts:  An unsuccessful telephone outreach was attempted for a scheduled appointment today.  Follow Up Plan:  Additional outreach attempts will be made to offer the patient care coordination information and services.   Encounter Outcome:  No Answer   Care Coordination Interventions:  No, not indicated    Delsa Sale, RN, BSN, CCM Care Management Coordinator [facility] Care Management  Direct Phone: [phone]

## 2023-01-26 NOTE — Progress Notes (Signed)
[facility] PREP Weekly Session  Patient Details  Name: Hannah [name] MRN: [id number] Date of Birth: 05-Jan-1946 Age: 77 y.o. PCP: Dorothyann Peng, MD  Vitals:   01/26/23 1112  Weight: 172 lb (78 kg)     [facility] Weekly seesion - 01/26/23 1100       [facility] "PREP" Location   [facility] "PREP" Location [facility]      Weekly Session   Topic Discussed Expectations and non-scale victories   Halfway through program, revisit goals; staying positive   Minutes exercised this week 240 minutes    Classes attended to date 46             Kyran Whittier B Ranger Petrich 01/26/2023, 11:13 AM

## 2023-02-02 NOTE — Progress Notes (Signed)
[facility] PREP Weekly Session  Patient Details  Name: Hannah [name] MRN: [id number] Date of Birth: [birth year] Age: 77 y.o. PCP: Dorothyann Peng, MD  Vitals:   02/02/23 1103  Weight: 171 lb (77.6 kg)     [facility] Weekly seesion - 02/02/23 1100       [facility] "PREP" Location   [facility] "PREP" Location [facility]      Weekly Session   Topic Discussed Other   Portion control; visualize portion size demo; review of food label red sugar craisins.   Minutes exercised this week 315 minutes    Classes attended to date 77             Decarlo Rivet B Shavaughn Seidl 02/02/2023, 11:04 AM

## 2023-02-04 DIAGNOSIS — M1711 Unilateral primary osteoarthritis, right knee: Secondary | ICD-10-CM | POA: Diagnosis not present

## 2023-02-04 DIAGNOSIS — Z96652 Presence of left artificial knee joint: Secondary | ICD-10-CM | POA: Diagnosis not present

## 2023-02-05 DIAGNOSIS — N183 Chronic kidney disease, stage 3 unspecified: Secondary | ICD-10-CM | POA: Diagnosis not present

## 2023-02-08 ENCOUNTER — Encounter: Payer: Self-pay | Admitting: Internal Medicine

## 2023-02-08 ENCOUNTER — Other Ambulatory Visit: Payer: Self-pay

## 2023-02-08 DIAGNOSIS — Z79899 Other long term (current) drug therapy: Secondary | ICD-10-CM | POA: Diagnosis not present

## 2023-02-08 DIAGNOSIS — M3219 Other organ or system involvement in systemic lupus erythematosus: Secondary | ICD-10-CM

## 2023-02-08 LAB — SEDIMENTATION RATE: Sed Rate: 17 mm/h (ref 0–30)

## 2023-02-08 LAB — CBC WITH DIFFERENTIAL/PLATELET
Absolute Monocytes: 413 cells/uL (ref 200–950)
Basophils Relative: 0.7 %
Eosinophils Absolute: 391 cells/uL (ref 15–500)
Eosinophils Relative: 7.1 %
MCH: 31.8 pg (ref 27.0–33.0)
RBC: 3.11 10*6/uL — ABNORMAL LOW (ref 3.80–5.10)
RDW: 13.1 % (ref 11.0–15.0)
WBC: 5.5 10*3/uL (ref 3.8–10.8)

## 2023-02-09 LAB — CBC WITH DIFFERENTIAL/PLATELET
HCT: 30.1 % — ABNORMAL LOW (ref 35.0–45.0)
MCHC: 32.9 g/dL (ref 32.0–36.0)
MPV: 11.7 fL (ref 7.5–12.5)
Total Lymphocyte: 21.8 %

## 2023-02-09 LAB — COMPLETE METABOLIC PANEL WITH GFR
Albumin: 3.8 g/dL (ref 3.6–5.1)
Globulin: 2.5 g/dL (calc) (ref 1.9–3.7)
Glucose, Bld: 86 mg/dL (ref 65–99)

## 2023-02-09 LAB — C3 AND C4
C3 Complement: 113 mg/dL (ref 83–193)
C4 Complement: 41 mg/dL (ref 15–57)

## 2023-02-09 NOTE — Progress Notes (Signed)
Patient remains anemic--please clarify if she has undergone a workup for anemia? Has she seen hematology?  Creatinine is elevated and GFR is low-44. Advise patient to avoid NSAID use.  Please also clarify if she has a nephrologist?  Protein creatinine ratio remains elevated-stable.   ESR WNL  Complements WNL

## 2023-02-10 ENCOUNTER — Other Ambulatory Visit: Payer: Self-pay | Admitting: Rheumatology

## 2023-02-10 DIAGNOSIS — Z79899 Other long term (current) drug therapy: Secondary | ICD-10-CM

## 2023-02-10 DIAGNOSIS — M0579 Rheumatoid arthritis with rheumatoid factor of multiple sites without organ or systems involvement: Secondary | ICD-10-CM

## 2023-02-10 LAB — ANTI-NUCLEAR AB-TITER (ANA TITER)
ANA TITER: 1:80 {titer} — ABNORMAL HIGH
ANA Titer 1: >=1:1280 {titer} — AB

## 2023-02-10 LAB — COMPLETE METABOLIC PANEL WITH GFR
AG Ratio: 1.5 (calc) (ref 1.0–2.5)
ALT: 14 U/L (ref 6–29)
AST: 22 U/L (ref 10–35)
Alkaline phosphatase (APISO): 54 U/L (ref 37–153)
BUN/Creatinine Ratio: 12 (calc) (ref 6–22)
BUN: 15 mg/dL (ref 7–25)
CO2: 28 mmol/L (ref 20–32)
Calcium: 9.4 mg/dL (ref 8.6–10.4)
Chloride: 108 mmol/L (ref 98–110)
Creat: 1.26 mg/dL — ABNORMAL HIGH (ref 0.60–1.00)
Potassium: 4.7 mmol/L (ref 3.5–5.3)
Sodium: 143 mmol/L (ref 135–146)
Total Bilirubin: 0.5 mg/dL (ref 0.2–1.2)
Total Protein: 6.3 g/dL (ref 6.1–8.1)
eGFR: 44 mL/min/{1.73_m2} — ABNORMAL LOW (ref >=60)

## 2023-02-10 LAB — CBC WITH DIFFERENTIAL/PLATELET
Basophils Absolute: 39 cells/uL (ref 0–200)
Hemoglobin: 9.9 g/dL — ABNORMAL LOW (ref 11.7–15.5)
Lymphs Abs: 1199 cells/uL (ref 850–3900)
MCV: 96.8 fL (ref 80.0–100.0)
Monocytes Relative: 7.5 %
Neutro Abs: 3460 cells/uL (ref 1500–7800)
Neutrophils Relative %: 62.9 %
Platelets: 195 10*3/uL (ref 140–400)

## 2023-02-10 LAB — ANTI-DNA ANTIBODY, DOUBLE-STRANDED: ds DNA Ab: 25 IU/mL — ABNORMAL HIGH

## 2023-02-10 LAB — PROTEIN / CREATININE RATIO, URINE
Creatinine, Urine: 124 mg/dL (ref 20–275)
Protein/Creat Ratio: 387 mg/g creat — ABNORMAL HIGH (ref 24–184)
Protein/Creatinine Ratio: 0.387 mg/mg creat — ABNORMAL HIGH (ref 0.024–0.184)
Total Protein, Urine: 48 mg/dL — ABNORMAL HIGH (ref 5–24)

## 2023-02-10 LAB — ANA: Anti Nuclear Antibody (ANA): POSITIVE — AB

## 2023-02-10 NOTE — Telephone Encounter (Signed)
Last Fill: 11/11/2022  Eye exam: 12/24/2022   Labs: 02/08/2023 Patient remains anemic--please clarify if she has undergone a workup for anemia? Has she seen hematology? Creatinine is elevated and GFR is low-44. Advise patient to avoid NSAID use.  Please also clarify if she has a nephrologist? Protein creatinine ratio remains elevated-stable.   ESR WNL Complements WNL. dsDNA remains positive but has trended down.     Next Visit: 03/16/2023  Last Visit: 10/15/2022  UE:[id number] arthritis involving multiple sites with positive rheumatoid factor   Current Dose per office note on 10/15/2022: Plaquenil 1 tablet by mouth daily.  Per lab note on 11/05/2022: Reviewed lab work with Dr. Marcello Moores increasing plaquenil to 200 mg 1 tablet by mouth twice daily Monday through Friday.  Repeat lab work in 3 months.   Okay to refill Plaquenil?

## 2023-02-10 NOTE — Progress Notes (Signed)
dsDNA remains positive but has trended down.

## 2023-02-10 NOTE — Telephone Encounter (Signed)
I called patient, patient verbalized understanding, Please advise patient to get hydroxychloroquine level with the next labs.  She should have labs drawn 4 hours after the hydroxychloroquine dose.  If she comes for lab work in the morning then she should skip the morning dose and take it after the labs are drawn.  I pended hydroxychloroquine level for the future labs.

## 2023-02-10 NOTE — Telephone Encounter (Signed)
Please advise patient to get hydroxychloroquine level with the next labs.  She should have labs drawn 4 hours after the hydroxychloroquine dose.  If she comes for lab work in the morning then she should skip the morning dose and take it after the labs are drawn.  I pended hydroxychloroquine level for the future labs.

## 2023-02-11 NOTE — Progress Notes (Signed)
ANA remains positive.

## 2023-02-16 NOTE — Progress Notes (Signed)
[facility] PREP Weekly Session  Patient Details  Name: Hannah [name] MRN: [id number] Date of Birth: 03/15/1946 Age: 77 y.o. PCP: Dorothyann Peng, MD  Vitals:   02/16/23 1127  Weight: 173 lb (78.5 kg)     [facility] Weekly seesion - 02/16/23 1100       [facility] "PREP" Location   [facility] "PREP" Location [facility]      Weekly Session   Topic Discussed Calorie breakdown   Carbs, fats and protens; membership talk w/Hannah [name]   Minutes exercised this week 240 minutes    Classes attended to date 93             Hannah [name] Hannah [name] 02/16/2023, 11:28 AM

## 2023-02-17 ENCOUNTER — Other Ambulatory Visit: Payer: Self-pay | Admitting: Rheumatology

## 2023-02-17 DIAGNOSIS — M0579 Rheumatoid arthritis with rheumatoid factor of multiple sites without organ or systems involvement: Secondary | ICD-10-CM

## 2023-02-21 ENCOUNTER — Other Ambulatory Visit: Payer: Self-pay | Admitting: Internal Medicine

## 2023-02-23 NOTE — Progress Notes (Signed)
[facility] PREP Weekly Session  Patient Details  Name: BRIZEIDA ZECCA MRN: [id number] Date of Birth: 1945/11/09 Age: 77 y.o. PCP: Dorothyann Peng, MD  Vitals:   02/23/23 1134  Weight: 173 lb (78.5 kg)     [facility] Weekly seesion - 02/23/23 1100       [facility] "PREP" Location   [facility] "PREP" Location [facility]      Weekly Session   Topic Discussed Hitting roadblocks   Review of goals and activity plan for next 3 months and PREP survey to bring to final assessment visit next week.   Minutes exercised this week 300 minutes    Classes attended to date 73             Tayte Childers B Azan Maneri 02/23/2023, 11:35 AM

## 2023-03-01 ENCOUNTER — Telehealth: Payer: Self-pay

## 2023-03-01 NOTE — Progress Notes (Signed)
Care Management & Coordination Services Pharmacy Team  Reason for Encounter: Appointment Reminder  Contacted patient to confirm telephone appointment with Cherylin Mylar, PharmD on 03-03-2023 at 9:00. Unsuccessful outreach. Left voicemail for patient to return call.  Chart review: Recent office visits:  01-12-2023 Dorothyann Peng, MD. Visit for annual exam. Stop Valsartan/HCTZ.  11-09-2022 Little, Karma Lew, RN (CC).  09-04-2022 Little, Karma Lew, RN (CC).  Recent consult visits:  02-23-2023 Sonia Baller, RN [facility]). PREP class.  02-16-2023 Sonia Baller, RN ([facility]). PREP class.  02-04-2023 Ollen Gross, MD (Ortho). Visit for pain in right knee.  02-02-2023 Sonia Baller, RN ([facility]). PREP class.  01-26-2023 Sonia Baller, RN ([facility]). PREP class.  01-19-2023 Sonia Baller, RN ([facility]). PREP class.  01-05-2023 Sonia Baller, RN ([facility]). PREP class.  01-01-2023 Anthony Sar (Nephrology). Unable to view encounter.  12-31-2022 KRISTIE EDMISTEN, PA-C  (Ortho). Visit for pain in right knee.  12-29-2022 Sonia Baller, RN ([facility]). PREP class.  12-24-2022 Ernesto Rutherford (Ophthalmology). DM Exam.  12-22-2022 Sonia Baller, RN ([facility]). PREP class.  12-15-2022 Sonia Baller, RN ([facility]). PREP class.  12-08-2022 Sonia Baller, RN ([facility]). PREP class.  12-07-2022 Murrell Redden, RPH-CPP (Rheumatology). Visit foe prolia injection.  11-28-2022 Irven Easterly, RN (Oncology). Visit for Iron sucrose injection.  11-26-2022 Genia Del, MD (OBGYN). Visit for annual exam.  11-21-2022 Edger House, RN  (Oncology). Visit for Iron sucrose injection.  11-18-2022 Bettey Costa (Dermatology). Unable to view encounter.  11-13-2022 Curtis Sites, RN (Oncology). Visit for Iron sucrose injection.  11-04-2022 Johney Maine, MD (Oncology). Follow up visit for anemia.  10-15-2022 Gearldine Bienenstock, PA-C (Rheumatology). Visit to discuss dexa scan results  09-29-2022  Ollen Gross, MD ([facility]). 3 month post op visit.  09-17-2022 Nahser, Deloris Ping, MD (Cardiology). Follow up visit for A fib and HTN.  Hospital visits:  None in previous 6 months  Star Rating Drugs:  Atorvastatin 80 mg- Last filled 01-04-2023 90 DS [facility]. Previous 10-07-2022 90 DS. Irbesartan 300 mg- Last filled 01-04-2023 90 DS [facility]. Previous 10-10-2022 90 DS. Jardiance 10 mg- Last filled 02-06-2023 30 DS [facility]. Previous 01-08-2023 30 DS  Care Gaps: Annual wellness visit in last year? Yes Covid booster overdue Foot exam overdue  If Diabetic: Last eye exam / retinopathy screening: 12-24-2022 Last diabetic foot exam:  None   Huey Romans [facility] Clinical Pharmacist Assistant [phone]

## 2023-03-02 NOTE — Progress Notes (Signed)
[facility] PREP Weekly Session  Patient Details  Name: Hannah [name] MRN: [id number] Date of Birth: 1946/02/03 Age: 77 y.o. PCP: Dorothyann Peng, MD  Vitals:   03/02/23 1132  Weight: 174 lb (78.9 kg)     [facility] Weekly seesion - 03/02/23 1100       [facility] "PREP" Location   [facility] "PREP" Location [facility]      Weekly Session   Topic Discussed Other   how fit and strong survery completed; appt made for final assessment visit Thursday; fit testing completed.   Minutes exercised this week 300 minutes    Classes attended to date 17             Jaydee Conran B Scherrie Seneca 03/02/2023, 11:33 AM

## 2023-03-02 NOTE — Progress Notes (Unsigned)
Office Visit Note  Patient: Hannah [name]             Date of Birth: [birth year]           MRN: [id number]             PCP: Dorothyann Peng, MD Referring: Dorothyann Peng, MD Visit Date: 03/16/2023 Occupation: @GUAROCC @  Subjective:  Medication monitoring  History of Present Illness: KRISTYE BRUHL is a 77 y.o. female with history of systemic lupus and rheumatoid arthritis.  Patient is taking plaquenil 200 mg 1 tablet twice daily Monday through Friday.  She is tolerating the increased dose of Plaquenil without any side effects.  The dose of Plaquenil was increased after lab work on 11/05/2022.  She has not noticed any change in symptoms since increasing the dose of Plaquenil.  She denies any signs or symptoms of a systemic lupus flare.  Patient states she continues to have chronic pain and stiffness in the right knee joint.  She is scheduled for right knee replacement with Dr. Lequita Halt on 07/12/23.  She denies any signs or symptoms of a gout flare.  She has been taking allopurinol 100 mg 3 days a week.  She states she has had some intermittent pain and stiffness in bilateral third MCP joints but states the pain has been fleeting and is not interfering with her activities or quality of life. Patient remains on Prolia 60 mg subcutaneous injections every 6 months.  She has a calcium vitamin D supplement daily. She remains under the care of nephrology.  She was started on Jardiance about 2 months ago.  She tries to avoid the use of NSAIDs.     Activities of Daily Living:  Patient reports morning stiffness for 30 minutes.   Patient Denies nocturnal pain.  Difficulty dressing/grooming: Denies Difficulty climbing stairs: Reports Difficulty getting out of chair: Reports Difficulty using hands for taps, buttons, cutlery, and/or writing: Denies  Review of Systems  Constitutional:  Positive for fatigue.  HENT:  Negative for mouth sores and mouth dryness.   Eyes:  Positive for dryness.   Respiratory:  Negative for shortness of breath.   Cardiovascular:  Negative for chest pain and palpitations.  Gastrointestinal:  Negative for blood in stool, constipation and diarrhea.  Endocrine: Negative for increased urination.  Genitourinary:  Positive for nocturia. Negative for involuntary urination.  Musculoskeletal:  Positive for joint pain, joint pain, joint swelling and morning stiffness. Negative for gait problem, myalgias, muscle weakness, muscle tenderness and myalgias.  Skin:  Negative for color change, rash, hair loss and sensitivity to sunlight.  Allergic/Immunologic: Negative for susceptible to infections.  Neurological:  Negative for dizziness and headaches.  Hematological:  Negative for swollen glands.  Psychiatric/Behavioral:  Positive for sleep disturbance. Negative for depressed mood. The patient is not nervous/anxious.     PMFS History:  Patient Active Problem List   Diagnosis Date Noted   Acquired thrombophilia (HCC) 01/12/2023   Arthrofibrosis of total knee arthroplasty (HCC) 07/06/2022   OA (osteoarthritis) of knee 02/02/2022   Primary osteoarthritis of left knee 02/02/2022   Chronic bilateral low back pain without sciatica 06/25/2021   Paresthesia and pain of extremity 06/25/2021   Atrial fibrillation (HCC) 04/16/2021   Demand ischemia 03/26/2021   Unspecified atrial fibrillation (HCC) 03/26/2021   COVID-19 virus infection 03/26/2021   PVC (premature ventricular contraction) 05/06/2020   Type 2 diabetes mellitus with stage 3 chronic kidney disease, without long-term current use of insulin (HCC) 03/20/2020   Palpitations  03/20/2020   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/2020   Primary hypothyroidism 03/20/2020   Vitiligo 03/20/2020   Iron deficiency anemia 08/12/2017   Osteoporosis 10/02/2016   Systemic lupus erythematosus (HCC) 10/02/2016   Rheumatoid arthritis involving multiple sites with positive rheumatoid factor (HCC) 10/02/2016   High risk medication  use 10/02/2016   History of chronic kidney disease 10/02/2016   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/2016   Primary osteoarthritis of both knees 10/02/2016   Vitamin D deficiency 10/02/2016   History of diabetes mellitus 10/02/2016   History of hypertension 10/02/2016   History of asthma 10/02/2016   Absolute anemia    MGUS (monoclonal gammopathy of unknown significance)    Normocytic anemia 03/21/2015   Essential hypertension 03/20/2015   Abnormal CT of the chest 07/30/2012   Asthma 06/19/2012    Past Medical History:  Diagnosis Date   Anemia    Arthritis    Asthma    Atrial fibrillation (HCC)    Chronic kidney disease    Diabetes mellitus without complication (HCC)    Gout 12/17/2014   patient reported   Hyperlipidemia    Hypertension    Hypothyroidism    Systemic lupus erythematosus (HCC)    Vitiligo     Family History  Problem Relation Age of Onset   Heart disease Father    Diabetes Father    Hypertension Mother    Hypertension Sister    Hypertension Sister    Leukemia Sister    Past Surgical History:  Procedure Laterality Date   CATARACT EXTRACTION Bilateral 2015   DILATION AND CURETTAGE OF UTERUS     DOPPLER ECHOCARDIOGRAPHY  05/2018   Internist to review with pt; potential heart murmur 06/20/18   keratosis removal  2021   KNEE CLOSED REDUCTION Left 07/06/2022   Procedure: CLOSED MANIPULATION KNEE;  Surgeon: Ollen Gross, MD;  Location: [facility];  Service: Orthopedics;  Laterality: Left;   REPLACEMENT TOTAL KNEE Left    SKIN SURGERY  11/30/2018   left side of face   TOOTH EXTRACTION     TOTAL KNEE ARTHROPLASTY Left 02/02/2022   Procedure: TOTAL KNEE ARTHROPLASTY;  Surgeon: Ollen Gross, MD;  Location: [facility];  Service: Orthopedics;  Laterality: Left;   Social History   Social History Narrative   Not on file   Immunization History  Administered Date(s) Administered   19-influenza Whole 07/15/2012   Covid-19,  Mrna,Vaccine(Spikevax)81yrs and older 07/27/2022   DTaP 06/18/2019   Fluad Quad(high Dose 65+) 06/19/2020, 06/03/2021   H1N1 08/09/2008   Influenza Split 07/27/2008, 06/28/2014   Influenza, High Dose Seasonal PF 05/18/2019, 06/04/2022   Influenza,inj,Quad PF,6+ Mos 06/20/2018   MMR 06/20/2010   Moderna SARS-COV2 Booster Vaccination 01/01/2021   Moderna Sars-Covid-2 Vaccination 11/20/2019, 12/19/2019, 08/06/2020, 01/01/2021, 08/13/2021   Pfizer Covid-19 Vaccine Bivalent Booster 20yrs & up 07/27/2022   Pneumococcal Conjugate-13 05/04/2018   Pneumococcal Polysaccharide-23 05/18/2019   Pneumococcal-Unspecified 06/28/2014   RSV,unspecified 10/22/2022   Tdap 06/15/2019   Tetanus 07/22/2005   Zoster Recombinat (Shingrix) 02/19/2021, 04/24/2021     Objective: Vital Signs: BP 135/84 (BP Location: Left Arm, Patient Position: Sitting, Cuff Size: Large)   Pulse 61   Resp 15   Ht 4' 10.5" (1.486 m)   Wt 174 lb (78.9 kg)   BMI 35.75 kg/m    Physical Exam Vitals and nursing note reviewed.  Constitutional:      Appearance: She is well-developed.  HENT:     Head: Normocephalic and atraumatic.  Eyes:  Conjunctiva/sclera: Conjunctivae normal.  Cardiovascular:     Rate and Rhythm: Normal rate and regular rhythm.     Heart sounds: Murmur heard.  Pulmonary:     Effort: Pulmonary effort is normal.     Breath sounds: Normal breath sounds.  Abdominal:     General: Bowel sounds are normal.     Palpations: Abdomen is soft.  Musculoskeletal:     Cervical back: Normal range of motion.  Lymphadenopathy:     Cervical: No cervical adenopathy.  Skin:    General: Skin is warm and dry.     Capillary Refill: Capillary refill takes less than 2 seconds.  Neurological:     Mental Status: She is alert and oriented to person, place, and time.  Psychiatric:        Behavior: Behavior normal.      Musculoskeletal Exam: C-spine has good range of motion.  Shoulder joints, elbow joints, wrist joints  have good range of motion.  Synovial thickening over bilateral second and third MCP joints.  Some tenderness over bilateral third MCP joints.  Complete fist formation noted bilaterally.  Hip joints have good range of motion with no groin pain.  Right knee joint has painful range of motion with limited extension.  Left knee replacement has good range of motion with no warmth or effusion.  Ankle joints have good range of motion without tenderness or joint swelling.  CDAI Exam: CDAI Score: 6  Patient Global: 20 / 100; Provider Global: 20 / 100 Swollen: 0 ; Tender: 2  Joint Exam 03/16/2023      Right  Left  MCP 3   Tender   Tender     Investigation: No additional findings.  Imaging: No results found.  Recent Labs: Lab Results  Component Value Date   WBC 5.5 02/08/2023   HGB 9.9 (L) 02/08/2023   PLT 195 02/08/2023   NA 145 (H) 03/03/2023   K 4.6 03/03/2023   CL 107 (H) 03/03/2023   CO2 26 03/03/2023   GLUCOSE 94 03/03/2023   BUN 17 03/03/2023   CREATININE 1.33 (H) 03/03/2023   BILITOT 0.5 02/08/2023   ALKPHOS 59 01/01/2023   AST 22 02/08/2023   ALT 14 02/08/2023   PROT 6.3 02/08/2023   ALBUMIN 4.1 01/01/2023   CALCIUM 9.3 03/03/2023   GFRAA 44 (L) 12/19/2020    Speciality Comments: PLQ eye exam: 12/24/2022 WNL@ [facility] Follow up in 1 year  Procedures:  No procedures performed Allergies: Shellfish allergy      Assessment / Plan:     Visit Diagnoses: Other systemic lupus erythematosus with other organ involvement (HCC) - Positive ANA, positive Smith, positive RNP: She has not had any signs or symptoms of a systemic lupus flare.  She has clinically been doing well taking Plaquenil 200 mg 1 tablet by mouth twice daily Monday through Friday.  She is tolerating Plaquenil without any side effects.  The dose of Plaquenil was increased after lab work on 11/05/2022 at which time her double-stranded DNA had trended up.  She has not noticed any change in symptoms since  increasing the dose of Plaquenil.  She continues to experience fleeting discomfort in bilateral third MCP joints but has not noticed any joint swelling or difficulty performing ADLs.  On examination today no synovitis was noted.  She has some synovial thickening over bilateral second and third MCP joints but no obvious inflammation was noted today. Her energy level has been stable.  She continues to have chronic  dry eyes.  She has not had any symptoms of Raynaud's phenomenon.  No recent rashes.  No Malar rash noted. Lab work from 02/08/23 was reviewed today in the office: ANA 1:1280NC, 1:80nuclear fine speckled, ESR WNL-17, complements WNL, dsDNA-25-improved.  Protein creatinine ratio is elevated-0.387.  Future orders for the following lab work will be updated today to be drawn in August.  She will remain on the current dose of plaquenil as prescribed.  She was advised to notify us if she develops any new or worsening symptoms.  She will follow-up in the office in 5 months or sooner if needed. - Plan: CBC with Differential/Platelet, Protein / creatinine ratio, urine, COMPLETE METABOLIC PANEL WITH GFR, C3 and C4, Anti-DNA antibody, double-stranded, Sedimentation rate, VITAMIN D 25 Hydroxy (Vit-D Deficiency, Fractures)  High risk medication use - Plaquenil 200 mg 1 tablet by mouth twice daily Monday through Friday.  CBC and CMP were drawn on 02/08/2023.  BMP was updated on 03/03/2023.  Creatinine was 1.33 and GFR was 41.  She has been trying to avoid the use of NSAIDs. PLQ eye exam: 12/24/2022 [facility] Follow up in 1 year  - Plan: CBC with Differential/Platelet, COMPLETE METABOLIC PANEL WITH GFR  Age-related osteoporosis without current pathological fracture - DEXA 09/03/22: RFN T-score -2.4 BMD 0.581. Previous DEXA 08/09/20: Total right hip BMD 0.660 with T score -2.4.  She remains on vitamin D 2000 units daily.  She remains on prolia 60 mg sq injections every 6 months.   Vitamin D deficiency -  Vitamin D will be rechecked with her next lab work. Plan: VITAMIN D 25 Hydroxy (Vit-D Deficiency, Fractures)  Rheumatoid arthritis involving multiple sites with positive rheumatoid factor (HCC) - Positive RF, positive anti-CCP: Patient has been experiencing fleeting pain in bilateral third MCP joints.  On examination she has synovial thickening of bilateral second and third MCP joints.  Some tenderness over the left third MCP was noted.  No obvious synovitis was noted.  X-rays of both hands were updated on 10/15/2018 fall at which time findings were consistent with rheumatoid arthritis and osteoarthritis overlap.  No radiographic progression was noted when compared to 2021.  Discussed that if her symptoms persist or worsen we can schedule an ultrasound of both hands to assess for synovitis.   For now she will remain on Plaquenil as prescribed.  Idiopathic chronic gout of multiple sites without tophus - She has not had any signs or symptoms of a gout flare.  She has clinically been doing well taking allopurinol 100 mg daily 3 days a week as prescribed by Dr. Allyne Gee for management of gout.   Status post total knee replacement, left - : Performed on 02/02/2022 by Dr. Despina Hick.  Completed physical therapy. Doing well.    Primary osteoarthritis of right knee -Limited extension.  Scheduled for right knee replacement 07/12/23  Other medical conditions are listed as follows:  MGUS (monoclonal gammopathy of unknown significance) - Dr. Candise Che  History of hypertension - BP 135/84 today in the office.  Chronic anticoagulation - Eliquis  Paroxysmal atrial fibrillation (HCC)  History of diabetes mellitus  History of chronic kidney disease  History of asthma  History of anemia  Orders: Orders Placed This Encounter  Procedures   CBC with Differential/Platelet   Protein / creatinine ratio, urine   COMPLETE METABOLIC PANEL WITH GFR   C3 and C4   Anti-DNA antibody, double-stranded   Sedimentation rate    VITAMIN D 25 Hydroxy (Vit-D Deficiency, Fractures)  No orders of the defined types were placed in this encounter.   Follow-Up Instructions: Return in 5 months (on 08/16/2023) for Systemic lupus erythematosus, Rheumatoid arthritis.   Gearldine Bienenstock, PA-C  Note - This record has been created using Dragon software.  Chart creation errors have been sought, but may not always  have been located. Such creation errors do not reflect on  the standard of medical care.

## 2023-03-03 ENCOUNTER — Other Ambulatory Visit: Payer: Self-pay | Admitting: Internal Medicine

## 2023-03-03 ENCOUNTER — Ambulatory Visit: Payer: Medicare HMO

## 2023-03-03 ENCOUNTER — Other Ambulatory Visit: Payer: Medicare HMO

## 2023-03-03 DIAGNOSIS — Z79899 Other long term (current) drug therapy: Secondary | ICD-10-CM | POA: Diagnosis not present

## 2023-03-03 NOTE — Progress Notes (Signed)
Care Management & Coordination Services Pharmacy Note  03/03/2023 Name:  Hannah [name] MRN:  [id number] DOB:  1945/11/05  Summary: Recommended patient come in for BP check due to reading in April >130/80. Recommended patient enroll in the Jardiance patient assistance program. Patient to have BMP completed due to new start of Jardiance 10 mg tablet once per day. Explained to patient that medication on after visit summary was a discontinued medication.     Recommendations/Changes made from today's visit: Recommend patient apply for patient assistance for Jardiance.  Recommend completing blood work, due to new start of Jardiance.  Follow up plan: Collaborate with Ms. Ludvigsen and the specialist to complete the Jardiance patient assistance form.  Ms. Lujan is going to come in for a BP check today at the office due to elevated BP readings.  Per PCP patient to have BP check completed next week in office.    Subjective: Hannah [name] is an 77 y.o. year old female who is a primary patient of Dorothyann Peng, MD.  The care coordination team was consulted for assistance with disease management and care coordination needs.    Engaged with patient by telephone for follow up visit.  Recent office visits:  01-12-2023 Dorothyann Peng, MD. Visit for annual exam. Stop Valsartan/HCTZ.   11-09-2022 Little, Karma Lew, RN (CC).   09-04-2022 Little, Karma Lew, RN (CC).   Recent consult visits:  02-23-2023 Sonia Baller, RN [facility]). PREP class.   02-16-2023 Sonia Baller, RN ([facility]). PREP class.   02-04-2023 Ollen Gross, MD (Ortho). Visit for pain in right knee.   02-02-2023 Sonia Baller, RN ([facility]). PREP class.   01-26-2023 Sonia Baller, RN ([facility]). PREP class.   01-19-2023 Sonia Baller, RN ([facility]). PREP class.   01-05-2023 Sonia Baller, RN ([facility]). PREP class.   01-01-2023 Anthony Sar (Nephrology). Unable to view encounter.   12-31-2022 KRISTIE EDMISTEN, PA-C  (Ortho).  Visit for pain in right knee.   12-29-2022 Sonia Baller, RN ([facility]). PREP class.   12-24-2022 Ernesto Rutherford (Ophthalmology). DM Exam.   12-22-2022 Sonia Baller, RN ([facility]). PREP class.   12-15-2022 Sonia Baller, RN ([facility]). PREP class.   12-08-2022 Sonia Baller, RN ([facility]). PREP class.   12-07-2022 Murrell Redden, RPH-CPP (Rheumatology). Visit foe prolia injection.   11-28-2022 Irven Easterly, RN (Oncology). Visit for Iron sucrose injection.   11-26-2022 Genia Del, MD (OBGYN). Visit for annual exam.   11-21-2022 Edger House, RN  (Oncology). Visit for Iron sucrose injection.   11-18-2022 Bettey Costa (Dermatology). Unable to view encounter.   11-13-2022 Curtis Sites, RN (Oncology). Visit for Iron sucrose injection.   11-04-2022 Johney Maine, MD (Oncology). Follow up visit for anemia.   10-15-2022 Gearldine Bienenstock, PA-C (Rheumatology). Visit to discuss dexa scan results   09-29-2022 Ollen Gross, MD ([facility]). 3 month post op visit.   09-17-2022 Nahser, Deloris Ping, MD (Cardiology). Follow up visit for A fib and HTN.   Hospital visits:  None in previous 6 months   Objective:  Lab Results  Component Value Date   CREATININE 1.26 (H) 02/08/2023   BUN 15 02/08/2023   EGFR 44 (L) 02/08/2023   GFRNONAA 52 (L) 10/28/2022   GFRAA 44 (L) 12/19/2020   NA 143 02/08/2023   K 4.7 02/08/2023   CALCIUM 9.4 02/08/2023   CO2 28 02/08/2023   GLUCOSE 86 02/08/2023    Lab Results  Component Value Date/Time  HGBA1C 5.4 01/12/2023 11:54 AM   HGBA1C 5.1 07/29/2022 03:46 PM   MICROALBUR 150 12/30/2020 01:02 PM   MICROALBUR 80 10/31/2019 12:13 PM    Last diabetic Eye exam:  Lab Results  Component Value Date/Time   HMDIABEYEEXA No Retinopathy 12/24/2022 08:36 AM    Last diabetic Foot exam: No results found for: "HMDIABFOOTEX"   Lab Results  Component Value Date   CHOL 131 01/12/2023   HDL 66 01/12/2023   LDLCALC 52 01/12/2023   TRIG  63 01/12/2023   CHOLHDL 2.0 01/12/2023       Latest Ref Rng & Units 02/08/2023    1:42 PM 01/01/2023   12:00 AM 11/05/2022    2:53 PM  Hepatic Function  Total Protein 6.1 - 8.1 g/dL 6.3   6.6   Albumin 3.5 - 5.0  4.1       AST 10 - 35 U/L 22  23     22    ALT 6 - 29 U/L 14  17     14    Alk Phosphatase 25 - 125  59       Total Bilirubin 0.2 - 1.2 mg/dL 0.5   0.4      This result is from an external source.    Lab Results  Component Value Date/Time   TSH 1.320 12/31/2021 11:44 AM   TSH 1.020 06/25/2021 12:17 PM   FREET4 1.44 12/31/2021 11:44 AM   FREET4 1.43 06/25/2021 12:17 PM       Latest Ref Rng & Units 02/08/2023    1:42 PM 01/01/2023   12:00 AM 11/05/2022    2:53 PM  CBC  WBC 3.8 - 10.8 Thousand/uL 5.5  10.7     5.0   Hemoglobin 11.7 - 15.5 g/dL 9.9  9.7     9.8   Hematocrit 35.0 - 45.0 % 30.1  29     29.0   Platelets 140 - 400 Thousand/uL 195   189      This result is from an external source.    Lab Results  Component Value Date/Time   VD25OH 54 11/05/2022 02:53 PM   VD25OH 49 05/13/2022 03:19 PM   VITAMINB12 1,907 (H) 04/16/2021 07:29 AM   VITAMINB12 257 12/30/2020 12:54 PM    Clinical ASCVD: No  The 10-year ASCVD risk score (Arnett DK, et al., 2019) is: 33.2%   Values used to calculate the score:     Age: 29 years     Sex: Female     Is Non-Hispanic African American: Yes     Diabetic: Yes     Tobacco smoker: No     Systolic Blood Pressure: 150 mmHg     Is BP treated: Yes     HDL Cholesterol: 66 mg/dL     Total Cholesterol: 131 mg/dL        10/28/8655   84:69 AM 07/29/2022    2:49 PM 06/27/2022    9:48 AM  Depression screen PHQ 2/9  Decreased Interest 0 0 0  Down, Depressed, Hopeless 0 0 0  PHQ - 2 Score 0 0 0  Altered sleeping 0    Tired, decreased energy 0    Change in appetite 0    Feeling bad or failure about yourself  0    Trouble concentrating 0    Moving slowly or fidgety/restless 0    Suicidal thoughts 0    PHQ-9 Score 0    Difficult  doing work/chores Not difficult at all  Social History   Tobacco Use  Smoking Status Never   Passive exposure: Never  Smokeless Tobacco Never   BP Readings from Last 3 Encounters:  01/12/23 (!) 150/84  12/08/22 (!) 160/62  11/28/22 133/61   Pulse Readings from Last 3 Encounters:  01/12/23 94  12/08/22 67  11/28/22 (!) 57   Wt Readings from Last 3 Encounters:  03/02/23 174 lb (78.9 kg)  02/23/23 173 lb (78.5 kg)  02/16/23 173 lb (78.5 kg)   BMI Readings from Last 3 Encounters:  03/02/23 36.37 kg/m  02/23/23 36.16 kg/m  02/16/23 36.16 kg/m    Allergies  Allergen Reactions   Shellfish Allergy Anaphylaxis and Hives    Medications Reviewed Today     Reviewed by Coolidge Breeze, CMA (Certified Medical Assistant) on 01/20/23 at 1036  Med List Status: <None>   Medication Order Taking? Sig Documenting Provider Last Dose Status Informant  Accu-Chek Softclix Lancets lancets [id number] Yes Use to check blood sugars daily E11.69 Dorothyann Peng, MD Taking Active Self  albuterol (PROVENTIL HFA;VENTOLIN HFA) 108 (90 Base) MCG/ACT inhaler [id number] Yes Inhale 2 puffs into the lungs every 6 (six) hours as needed for wheezing or shortness of breath. Arnette Felts, FNP Taking Active Self  Alcohol Swabs (ALCOHOL PADS) 70 % PADS [id number] Yes Use as directed to check blood sugars 1 time per day dx: e11.22 Dorothyann Peng, MD Taking Active   allopurinol (ZYLOPRIM) 100 MG tablet [id number] Yes Take 1 tablet (100 mg total) by mouth 3 (three) times a week for 90 doses. Dorothyann Peng, MD Taking Active   amLODipine (NORVASC) 2.5 MG tablet [id number] Yes TAKE 1 TABLET(2.5 MG) BY MOUTH DAILY Dorothyann Peng, MD Taking Active   apixaban (ELIQUIS) 5 MG TABS tablet [id number] Yes Take 1 tablet (5 mg total) by mouth 2 (two) times daily. Nahser, Deloris Ping, MD Taking Active   Ascorbic Acid (VITAMIN C) 1000 MG tablet [id number] Yes Take 1,000 mg by mouth daily. [provider] Taking Active  Self  atorvastatin (LIPITOR) 80 MG tablet [id number] Yes TAKE 1 TABLET BY MOUTH EVERY DAY Dorothyann Peng, MD Taking Active   budesonide-formoterol [facility]) 160-4.5 MCG/ACT inhaler [id number] Yes INHALE 2 PUFFS BY MOUTH TWICE DAILY IN THE MORNING AND IN THE Josephine Igo, MD Taking Active   Calcium Carb-Cholecalciferol 600-800 MG-UNIT TABS [id number] Yes Take 1 tablet by mouth daily.  [provider] Taking Active Self  Cholecalciferol (VITAMIN D PO) [id number] Yes Take 2,000 Units by mouth daily. [provider] Taking Active Self  cyanocobalamin 1000 MCG tablet [id number] Yes Take 1,000 mcg by mouth daily. [provider] Taking Active Self           Med Note Hart Rochester, Lafe Garin   Tue Jun 23, 2022  1:02 PM)    denosumab (PROLIA) 60 MG/ML SOSY injection [id number] Yes Inject 60 mg into the skin every 6 (six) months. Courier to rheum: [address], [address], [place] [address]. Appt on 12/07/2022 Pollyann Savoy, MD Taking Active   glucose blood (ACCU-CHEK GUIDE) test strip [id number] Yes Use to check blood sugars daily E11.69 Dorothyann Peng, MD Taking Active   hydroxychloroquine (PLAQUENIL) 200 MG tablet [id number] Yes TAKE 1 TABLET(200 MG) BY MOUTH DAILY TWICE DAILY MONDAY THROUGH FRIDAY ONLY. Pollyann Savoy, MD Taking Active   Hypromellose (ARTIFICIAL TEARS OP) [id number] Yes Apply 1 drop to eye daily as needed (dry eyes). [provider] Taking Active Self  irbesartan (AVAPRO) 300 MG tablet [id number] Yes TAKE 1 TABLET(300 MG)  BY MOUTH DAILY Dorothyann Peng, MD Taking Active   JARDIANCE 10 MG TABS tablet [id number] Yes Take 10 mg by mouth daily. [provider] Taking Active   metoprolol succinate (TOPROL-XL) 25 MG 24 hr tablet [id number] Yes TAKE 1 TABLET(25 MG) BY MOUTH DAILY Nahser, Deloris Ping, MD Taking Active   SYNTHROID 88 MCG tablet [id number] Yes TAKE 1 TABLET BY MOUTH EVERY DAY MONDAY TO SATURDAY AND OFF ON Forrestine Him, MD Taking  Active   UNABLE TO FIND [id number] Yes Med Name: iron infusions [provider] Taking Active   Discontinued 01/20/23 1036 (Change in therapy)             SDOH:  (Social Determinants of Health) assessments and interventions performed: No SDOH Interventions    Flowsheet Row Chronic Care Management from 03/10/2022 in [facility] Office Visit from 06/25/2021 in [facility] Chronic Care Management from 05/23/2020 in [facility] Chronic Care Management from 04/25/2020 in [facility] Chronic Care Management from 04/22/2020 in [facility] Chronic Care Management from 02/29/2020 in [facility]  SDOH Interventions        Housing Interventions -- Intervention Not Indicated -- -- -- --  Financial Strain Interventions -- -- Other (Comment)  [Assist with patient assistance application appeal for Synthroid] Other (Comment)  [Assisted patient with completing Synthroid patient assistance application] Other (Comment)  [Will assist patient with completing patient assistance application for Synthroid] --  [Will review patient assistance options for Synthroid]  Physical Activity Interventions --  [Physical Therapy for knee replacement surgery] -- -- -- -- --  Social Connections Interventions -- Intervention Not Indicated -- -- -- --       Medication Assistance: None required.  Patient affirms current coverage meets needs.  Medication Access: Name and location of current pharmacy:  [id number] [place], [place] - [address] AT [facility] [address] [facility] [address] [place] [address] Phone: [phone] Fax: [phone]  [place] [facility] [address] [place] [address] Phone: [phone] Fax: [phone]  [facility]  [id number] [place], [place] - [address] [facility] [address] [address] [place] [address] Phone: [phone] Fax: [phone]  [facility] - [place], [place] - [address] [place] [address] Phone: [phone] Fax: [phone]  [facility] - [place] - [address] [place] [address] Phone: [phone] Fax: [phone]  Within the past 30 days, how often has patient missed a dose of medication? No Is a pillbox or other method used to improve adherence? Yes  Factors that may affect medication adherence? financial need and adverse effects of medications Are meds synced by current pharmacy? No  Are meds delivered by current pharmacy? No  Does patient experience delays in picking up medications due to transportation concerns? No   Compliance/Adherence/Medication fill history: Care Gaps: Annual wellness visit in last year? Yes Covid booster overdue Foot exam overdue   If Diabetic: Last eye exam / retinopathy screening: 12-24-2022 Last diabetic foot exam:  None  Star-Rating Drugs: Atorvastatin 80 mg- Last filled 01-04-2023 90 DS [facility]. Previous 10-07-2022 90 DS. Irbesartan 300 mg- Last filled 01-04-2023 90 DS [facility]. Previous 10-10-2022 90 DS. Jardiance 10 mg- Last filled 02-06-2023 30 DS [facility]. Previous  01-08-2023 30 DS   Assessment/Plan  Hypertension (BP goal <130/80) -Uncontrolled -Current treatment: Irbersartan 300 mg tablet once per day Appropriate, Effective, Safe, Accessible Amlodipine 2.5 mg tablet once per day Appropriate, Effective, Safe, Accessible -Current home readings: 129/83 pulse: 67, 139/68 pulse: 57, 132/74 pulse: 70, 138/73 pulse:58, 124/58 pulse:64 -Current dietary habits: limiting salt, and using different seasonings and spices  -Current exercise habits: she is exercising with the PREP program and the silver sneakers program at least three times  per week, for at least 4 to 4 1/2 hours per week,.  -Denies hypotensive/hypertensive symptoms -Educated on BP goals and benefits of medications for prevention of heart attack, stroke and kidney damage; Exercise goal of 150 minutes per week; Importance of home blood pressure monitoring; Proper BP monitoring technique; -Counseled to monitor BP at home at least once per day, document, and provide log at future appointments -Recommended to continue current medication Collaborated with patient to recommend patient come in for a BP check today due to her elevated reading in April    Chronic Kidney Disease Stage Stage 3   -All medications assessed for renal dosing and appropriateness in chronic kidney disease. -Current therapy:  Jardiance 10 mg tablet once per day Appropriate, Effective, Safe, Accessible Started by specialist, currently $45 Irbersartan 300 mg tablet once per day Appropriate, Effective, Safe, Accessible -She is drinking at least 7-8, 8 ounce glasses of water per day  -She is being more careful in the use of salt in her food  -Counseled on diet and exercise extensively Collaborated with patient to review ways to continue to maintain kidney health  -Patient to collaborate withe specialist to determine if she should    Cherylin Mylar, CPP, PharmD Clinical Pharmacist Practitioner [facility] [phone]

## 2023-03-04 LAB — BMP8+EGFR
BUN/Creatinine Ratio: 13 (ref 12–28)
BUN: 17 mg/dL (ref 8–27)
CO2: 26 mmol/L (ref 20–29)
Calcium: 9.3 mg/dL (ref 8.7–10.3)
Chloride: 107 mmol/L — ABNORMAL HIGH (ref 96–106)
Creatinine, Ser: 1.33 mg/dL — ABNORMAL HIGH (ref 0.57–1.00)
Glucose: 94 mg/dL (ref 70–99)
Potassium: 4.6 mmol/L (ref 3.5–5.2)
Sodium: 145 mmol/L — ABNORMAL HIGH (ref 134–144)
eGFR: 41 mL/min/{1.73_m2} — ABNORMAL LOW (ref >59)

## 2023-03-04 NOTE — Progress Notes (Signed)
[facility] PREP Evaluation  Patient Details  Name: Hannah [name] MRN: [id number] Date of Birth: 1945-10-13 Age: 77 y.o. PCP: Dorothyann Peng, MD  Vitals:   03/04/23 0945  BP: (!) 172/64  Pulse: 69  SpO2: 98%  Weight: 173 lb 12.8 oz (78.8 kg)     [facility] Eval - 03/04/23 0900       [facility] "PREP" Location   [facility] "PREP" Location [facility]      Referral    Program Start Date 12/15/22    Program End Date 03/04/23      Measurement   Waist Circumference 42.5 inches    Waist Circumference End Program 42 inches    Hip Circumference 48 inches    Hip Circumference End Program 48 inches    Body fat 46.1 percent      Mobility and Daily Activities   I find it easy to walk up or down two or more flights of stairs. 2    I have no trouble taking out the trash. 4    I do housework such as vacuuming and dusting on my own without difficulty. 4    I can easily lift a gallon of milk (8lbs). 4    I can easily walk a mile. 1    I have no trouble reaching into high cupboards or reaching down to pick up something from the floor. 1    I do not have trouble doing out-door work such as Loss adjuster, chartered, raking leaves, or gardening. 3      Mobility and Daily Activities   I feel younger than my age. 4    I feel independent. 4    I feel energetic. 3    I live an active life.  4    I feel strong. 3    I feel healthy. 3    I feel active as other people my age. 4      How fit and strong are you.   Fit and Strong Total Score 44            Past Medical History:  Diagnosis Date   Anemia    Arthritis    Asthma    Atrial fibrillation (HCC)    Chronic kidney disease    Diabetes mellitus without complication (HCC)    Gout 12/17/2014   patient reported   Hyperlipidemia    Hypertension    Hypothyroidism    Systemic lupus erythematosus (HCC)    Vitiligo    Past Surgical History:  Procedure Laterality Date   CATARACT EXTRACTION Bilateral 2015   DILATION AND CURETTAGE OF UTERUS      DOPPLER ECHOCARDIOGRAPHY  05/2018   Internist to review with pt; potential heart murmur 06/20/18   keratosis removal  2021   KNEE CLOSED REDUCTION Left 07/06/2022   Procedure: CLOSED MANIPULATION KNEE;  Surgeon: Ollen Gross, MD;  Location: [facility];  Service: Orthopedics;  Laterality: Left;   REPLACEMENT TOTAL KNEE Left    SKIN SURGERY  11/30/2018   left side of face   TOOTH EXTRACTION     TOTAL KNEE ARTHROPLASTY Left 02/02/2022   Procedure: TOTAL KNEE ARTHROPLASTY;  Surgeon: Ollen Gross, MD;  Location: [facility];  Service: Orthopedics;  Laterality: Left;   Social History   Tobacco Use  Smoking Status Never   Passive exposure: Never  Smokeless Tobacco Never  Wt loss: 1.6 pounds Inches lost: 0.5 Education sessions completed: 10 Strength training sessions completed: 9   Davier Tramell B Liliana Dang 03/04/2023,  9:47 AM

## 2023-03-09 ENCOUNTER — Ambulatory Visit: Payer: Medicare HMO

## 2023-03-09 VITALS — BP 140/60 | HR 75 | Temp 98.5°F | Ht <= 58 in | Wt 173.0 lb

## 2023-03-09 DIAGNOSIS — I10 Essential (primary) hypertension: Secondary | ICD-10-CM

## 2023-03-09 NOTE — Patient Instructions (Signed)
Hypertension, Adult Hypertension is another name for high blood pressure. High blood pressure forces your heart to work harder to pump blood. This can cause problems over time. There are two numbers in a blood pressure reading. There is a top number (systolic) over a bottom number (diastolic). It is best to have a blood pressure that is below 120/80. What are the causes? The cause of this condition is not known. Some other conditions can lead to high blood pressure. What increases the risk? Some lifestyle factors can make you more likely to develop high blood pressure: Smoking. Not getting enough exercise or physical activity. Being overweight. Having too much fat, sugar, calories, or salt (sodium) in your diet. Drinking too much alcohol. Other risk factors include: Having any of these conditions: Heart disease. Diabetes. High cholesterol. Kidney disease. Obstructive sleep apnea. Having a family history of high blood pressure and high cholesterol. Age. The risk increases with age. Stress. What are the signs or symptoms? High blood pressure may not cause symptoms. Very high blood pressure (hypertensive crisis) may cause: Headache. Fast or uneven heartbeats (palpitations). Shortness of breath. Nosebleed. Vomiting or feeling like you may vomit (nauseous). Changes in how you see. Very bad chest pain. Feeling dizzy. Seizures. How is this treated? This condition is treated by making healthy lifestyle changes, such as: Eating healthy foods. Exercising more. Drinking less alcohol. Your doctor may prescribe medicine if lifestyle changes do not help enough and if: Your top number is above 130. Your bottom number is above 80. Your personal target blood pressure may vary. Follow these instructions at home: Eating and drinking  If told, follow the DASH eating plan. To follow this plan: Fill one half of your plate at each meal with fruits and vegetables. Fill one fourth of your plate  at each meal with whole grains. Whole grains include whole-wheat pasta, brown rice, and whole-grain bread. Eat or drink low-fat dairy products, such as skim milk or low-fat yogurt. Fill one fourth of your plate at each meal with low-fat (lean) proteins. Low-fat proteins include fish, chicken without skin, eggs, beans, and tofu. Avoid fatty meat, cured and processed meat, or chicken with skin. Avoid pre-made or processed food. Limit the amount of salt in your diet to less than 1,500 mg each day. Do not drink alcohol if: Your doctor tells you not to drink. You are pregnant, may be pregnant, or are planning to become pregnant. If you drink alcohol: Limit how much you have to: 0-1 drink a day for women. 0-2 drinks a day for men. Know how much alcohol is in your drink. In the [place] one drink equals one 12 oz bottle of beer (355 mL), one 5 oz glass of wine (148 mL), or one 1 oz glass of hard liquor (44 mL). Lifestyle  Work with your doctor to stay at a healthy weight or to lose weight. Ask your doctor what the best weight is for you. Get at least 30 minutes of exercise that causes your heart to beat faster (aerobic exercise) most days of the week. This may include walking, swimming, or biking. Get at least 30 minutes of exercise that strengthens your muscles (resistance exercise) at least 3 days a week. This may include lifting weights or doing Pilates. Do not smoke or use any products that contain nicotine or tobacco. If you need help quitting, ask your doctor. Check your blood pressure at home as told by your doctor. Keep all follow-up visits. Medicines Take over-the-counter and prescription medicines   only as told by your doctor. Follow directions carefully. Do not skip doses of blood pressure medicine. The medicine does not work as well if you skip doses. Skipping doses also puts you at risk for problems. Ask your doctor about side effects or reactions to medicines that you should watch  for. Contact a doctor if: You think you are having a reaction to the medicine you are taking. You have headaches that keep coming back. You feel dizzy. You have swelling in your ankles. You have trouble with your vision. Get help right away if: You get a very bad headache. You start to feel mixed up (confused). You feel weak or numb. You feel faint. You have very bad pain in your: Chest. Belly (abdomen). You vomit more than once. You have trouble breathing. These symptoms may be an emergency. Get help right away. Call 911. Do not wait to see if the symptoms will go away. Do not drive yourself to the hospital. Summary Hypertension is another name for high blood pressure. High blood pressure forces your heart to work harder to pump blood. For most people, a normal blood pressure is less than 120/80. Making healthy choices can help lower blood pressure. If your blood pressure does not get lower with healthy choices, you may need to take medicine. This information is not intended to replace advice given to you by your health care provider. Make sure you discuss any questions you have with your health care provider. Document Revised: 07/03/2021 Document Reviewed: 07/03/2021 Elsevier Patient Education  2024 Elsevier Inc.  

## 2023-03-09 NOTE — Progress Notes (Signed)
Patient presents today for a BP check, patient currently taking amLODipine 2.5mg  AM, irbesartan 300mg  AM, metoprolol succinate 25mg  AM.  BP Readings from Last 3 Encounters:  03/09/23 (!) 150/62  03/04/23 (!) 172/64  03/03/23 (!) 150/81  Per provider- take amlodipine in the evenings f/u 1 week

## 2023-03-10 ENCOUNTER — Other Ambulatory Visit: Payer: Self-pay | Admitting: *Deleted

## 2023-03-10 DIAGNOSIS — Z79899 Other long term (current) drug therapy: Secondary | ICD-10-CM

## 2023-03-16 ENCOUNTER — Encounter: Payer: Self-pay | Admitting: Physician Assistant

## 2023-03-16 ENCOUNTER — Ambulatory Visit: Payer: Medicare HMO

## 2023-03-16 ENCOUNTER — Ambulatory Visit: Payer: Medicare HMO | Attending: Physician Assistant | Admitting: Physician Assistant

## 2023-03-16 ENCOUNTER — Other Ambulatory Visit: Payer: Self-pay

## 2023-03-16 ENCOUNTER — Other Ambulatory Visit: Payer: Self-pay | Admitting: Internal Medicine

## 2023-03-16 VITALS — BP 142/70 | HR 71 | Temp 98.1°F | Ht <= 58 in | Wt 174.0 lb

## 2023-03-16 VITALS — BP 135/84 | HR 61 | Resp 15 | Ht 58.5 in | Wt 174.0 lb

## 2023-03-16 DIAGNOSIS — Z79899 Other long term (current) drug therapy: Secondary | ICD-10-CM | POA: Diagnosis not present

## 2023-03-16 DIAGNOSIS — D472 Monoclonal gammopathy: Secondary | ICD-10-CM

## 2023-03-16 DIAGNOSIS — I10 Essential (primary) hypertension: Secondary | ICD-10-CM

## 2023-03-16 DIAGNOSIS — Z862 Personal history of diseases of the blood and blood-forming organs and certain disorders involving the immune mechanism: Secondary | ICD-10-CM

## 2023-03-16 DIAGNOSIS — I480 Paroxysmal atrial fibrillation: Secondary | ICD-10-CM

## 2023-03-16 DIAGNOSIS — M1711 Unilateral primary osteoarthritis, right knee: Secondary | ICD-10-CM | POA: Diagnosis not present

## 2023-03-16 DIAGNOSIS — M3219 Other organ or system involvement in systemic lupus erythematosus: Secondary | ICD-10-CM

## 2023-03-16 DIAGNOSIS — Z8639 Personal history of other endocrine, nutritional and metabolic disease: Secondary | ICD-10-CM

## 2023-03-16 DIAGNOSIS — E559 Vitamin D deficiency, unspecified: Secondary | ICD-10-CM | POA: Diagnosis not present

## 2023-03-16 DIAGNOSIS — M810 Age-related osteoporosis without current pathological fracture: Secondary | ICD-10-CM

## 2023-03-16 DIAGNOSIS — Z87448 Personal history of other diseases of urinary system: Secondary | ICD-10-CM

## 2023-03-16 DIAGNOSIS — M0579 Rheumatoid arthritis with rheumatoid factor of multiple sites without organ or systems involvement: Secondary | ICD-10-CM | POA: Diagnosis not present

## 2023-03-16 DIAGNOSIS — M1A09X0 Idiopathic chronic gout, multiple sites, without tophus (tophi): Secondary | ICD-10-CM | POA: Diagnosis not present

## 2023-03-16 DIAGNOSIS — Z7901 Long term (current) use of anticoagulants: Secondary | ICD-10-CM

## 2023-03-16 DIAGNOSIS — Z96652 Presence of left artificial knee joint: Secondary | ICD-10-CM | POA: Diagnosis not present

## 2023-03-16 DIAGNOSIS — Z8709 Personal history of other diseases of the respiratory system: Secondary | ICD-10-CM

## 2023-03-16 DIAGNOSIS — Z8679 Personal history of other diseases of the circulatory system: Secondary | ICD-10-CM

## 2023-03-16 LAB — HYDROXYCHLOROQUINE,BLOOD: HYDROXYCHLOROQUINE, (B): 1200 ng/mL — ABNORMAL HIGH

## 2023-03-16 MED ORDER — AMLODIPINE BESYLATE 5 MG PO TABS
5.0000 mg | ORAL_TABLET | Freq: Every day | ORAL | 1 refills | Status: DC
Start: 2023-03-16 — End: 2023-04-19

## 2023-03-16 NOTE — Progress Notes (Signed)
Patient presents today for bpc. She currently takes Amlodipine 2.5MG  she reports taking at night , Metoprolol 25MG  & Irbesartan 300MG  she reports taking in the morning. Denies headache, dizziness & chest pain. Patient reports having car trouble this morning, resulting in her showing up this afternoon to make this apt. Initial bp: 160/78. Bp taken again after 10 minutes: BP Readings from Last 3 Encounters:  03/16/23 (!) 142/70  03/16/23 135/84  03/09/23 (!) 140/60  Per provider patient is to make goal less than 130/80. Patient aware. Amlodipine increased to 5mg . Patient will come back in 2 weeks for NV. Appointment scheduled, patient notified.

## 2023-03-16 NOTE — Patient Instructions (Signed)
Standing Labs We placed an order today for your standing lab work.   Please have your standing labs drawn in August and every 3 months   Please have your labs drawn 2 weeks prior to your appointment so that the provider can discuss your lab results at your appointment, if possible.  Please note that you may see your imaging and lab results in MyChart before we have reviewed them. We will contact you once all results are reviewed. Please allow our office up to 72 hours to thoroughly review all of the results before contacting the office for clarification of your results.  WALK-IN LAB HOURS  Monday through Thursday from 8:00 am -12:30 pm and 1:00 pm-5:00 pm and Friday from 8:00 am-12:00 pm.  Patients with office visits requiring labs will be seen before walk-in labs.  You may encounter longer than normal wait times. Please allow additional time. Wait times may be shorter on  Monday and Thursday afternoons.  We do not book appointments for walk-in labs. We appreciate your patience and understanding with our staff.   Labs are drawn by [facility]. Please bring your co-pay at the time of your lab draw.  You may receive a bill from [facility] for your lab work.  Please note if you are on Hydroxychloroquine and and an order has been placed for a Hydroxychloroquine level,  you will need to have it drawn 4 hours or more after your last dose.  If you wish to have your labs drawn at another location, please call the office 24 hours in advance so we can fax the orders.  The office is located at [address], [address], [place], [place] [address]   If you have any questions regarding directions or hours of operation,  please call [phone].   As a reminder, please drink plenty of water prior to coming for your lab work. Thanks!  

## 2023-03-18 ENCOUNTER — Other Ambulatory Visit: Payer: Self-pay

## 2023-03-18 DIAGNOSIS — M0579 Rheumatoid arthritis with rheumatoid factor of multiple sites without organ or systems involvement: Secondary | ICD-10-CM

## 2023-03-18 NOTE — Progress Notes (Unsigned)
Plaquenil dose change per lab note on 03/18/2023. Please review and sign.

## 2023-03-22 ENCOUNTER — Ambulatory Visit: Payer: Self-pay

## 2023-03-22 MED ORDER — HYDROXYCHLOROQUINE SULFATE 200 MG PO TABS: ORAL_TABLET | ORAL | 0 refills | Status: DC

## 2023-03-22 NOTE — Patient Instructions (Signed)
Visit Information  Thank you for taking time to visit with me today. Please don't hesitate to contact me if I can be of assistance to you.   Following are the goals we discussed today:   Goals Addressed             This Visit's Progress    To get better BP management       Care Coordination Interventions: Evaluation of current treatment plan related to hypertension self management and patient's adherence to plan as established by provider Reviewed medications with patient and discussed importance of compliance Advised patient, providing education and rationale, to monitor blood pressure daily and record, calling PCP for findings outside established parameters Provided education on prescribed diet low Sodium  Mailed printed educational materials related limiting Sodium  Last practice recorded BP readings:  BP Readings from Last 3 Encounters:  03/16/23 (!) 142/70  03/16/23 135/84  03/09/23 (!) 140/60  Most recent eGFR/CrCl:  Lab Results  Component Value Date   EGFR 41 (L) 03/03/2023    No components found for: "CRCL"     To undergo right total knee surgery       Care Coordination Interventions: Evaluation of current treatment plan related to arthritis of right knee  and patient's adherence to plan as established by provider Reviewed and discussed with patient upcoming scheduled right total knee replacement set for 07/12/23 Determined patient's sister will be available to assist her following the surgery         Our next appointment is by telephone on 07/02/23 at 09:00 AM   Please call the care guide team at [phone] if you need to cancel or reschedule your appointment.   If you are experiencing a Mental Health or Behavioral Health Crisis or need someone to talk to, please call 1-800-273-TALK (toll free, 24 hour hotline)  Patient verbalizes understanding of instructions and care plan provided today and agrees to view in MyChart. Active MyChart status and patient  understanding of how to access instructions and care plan via MyChart confirmed with patient.     Delsa Sale, RN, BSN, CCM Care Management Coordinator [facility]  Direct Phone: [phone]

## 2023-03-30 ENCOUNTER — Ambulatory Visit: Payer: Medicare HMO

## 2023-03-30 VITALS — BP 150/70 | HR 64 | Temp 98.7°F | Ht <= 58 in | Wt 174.0 lb

## 2023-03-30 DIAGNOSIS — I10 Essential (primary) hypertension: Secondary | ICD-10-CM

## 2023-03-30 NOTE — Patient Instructions (Signed)
Hypertension, Adult Hypertension is another name for high blood pressure. High blood pressure forces your heart to work harder to pump blood. This can cause problems over time. There are two numbers in a blood pressure reading. There is a top number (systolic) over a bottom number (diastolic). It is best to have a blood pressure that is below 120/80. What are the causes? The cause of this condition is not known. Some other conditions can lead to high blood pressure. What increases the risk? Some lifestyle factors can make you more likely to develop high blood pressure: Smoking. Not getting enough exercise or physical activity. Being overweight. Having too much fat, sugar, calories, or salt (sodium) in your diet. Drinking too much alcohol. Other risk factors include: Having any of these conditions: Heart disease. Diabetes. High cholesterol. Kidney disease. Obstructive sleep apnea. Having a family history of high blood pressure and high cholesterol. Age. The risk increases with age. Stress. What are the signs or symptoms? High blood pressure may not cause symptoms. Very high blood pressure (hypertensive crisis) may cause: Headache. Fast or uneven heartbeats (palpitations). Shortness of breath. Nosebleed. Vomiting or feeling like you may vomit (nauseous). Changes in how you see. Very bad chest pain. Feeling dizzy. Seizures. How is this treated? This condition is treated by making healthy lifestyle changes, such as: Eating healthy foods. Exercising more. Drinking less alcohol. Your doctor may prescribe medicine if lifestyle changes do not help enough and if: Your top number is above 130. Your bottom number is above 80. Your personal target blood pressure may vary. Follow these instructions at home: Eating and drinking  If told, follow the DASH eating plan. To follow this plan: Fill one half of your plate at each meal with fruits and vegetables. Fill one fourth of your plate  at each meal with whole grains. Whole grains include whole-wheat pasta, brown rice, and whole-grain bread. Eat or drink low-fat dairy products, such as skim milk or low-fat yogurt. Fill one fourth of your plate at each meal with low-fat (lean) proteins. Low-fat proteins include fish, chicken without skin, eggs, beans, and tofu. Avoid fatty meat, cured and processed meat, or chicken with skin. Avoid pre-made or processed food. Limit the amount of salt in your diet to less than 1,500 mg each day. Do not drink alcohol if: Your doctor tells you not to drink. You are pregnant, may be pregnant, or are planning to become pregnant. If you drink alcohol: Limit how much you have to: 0-1 drink a day for women. 0-2 drinks a day for men. Know how much alcohol is in your drink. In the [place] one drink equals one 12 oz bottle of beer (355 mL), one 5 oz glass of wine (148 mL), or one 1 oz glass of hard liquor (44 mL). Lifestyle  Work with your doctor to stay at a healthy weight or to lose weight. Ask your doctor what the best weight is for you. Get at least 30 minutes of exercise that causes your heart to beat faster (aerobic exercise) most days of the week. This may include walking, swimming, or biking. Get at least 30 minutes of exercise that strengthens your muscles (resistance exercise) at least 3 days a week. This may include lifting weights or doing Pilates. Do not smoke or use any products that contain nicotine or tobacco. If you need help quitting, ask your doctor. Check your blood pressure at home as told by your doctor. Keep all follow-up visits. Medicines Take over-the-counter and prescription medicines   only as told by your doctor. Follow directions carefully. Do not skip doses of blood pressure medicine. The medicine does not work as well if you skip doses. Skipping doses also puts you at risk for problems. Ask your doctor about side effects or reactions to medicines that you should watch  for. Contact a doctor if: You think you are having a reaction to the medicine you are taking. You have headaches that keep coming back. You feel dizzy. You have swelling in your ankles. You have trouble with your vision. Get help right away if: You get a very bad headache. You start to feel mixed up (confused). You feel weak or numb. You feel faint. You have very bad pain in your: Chest. Belly (abdomen). You vomit more than once. You have trouble breathing. These symptoms may be an emergency. Get help right away. Call 911. Do not wait to see if the symptoms will go away. Do not drive yourself to the hospital. Summary Hypertension is another name for high blood pressure. High blood pressure forces your heart to work harder to pump blood. For most people, a normal blood pressure is less than 120/80. Making healthy choices can help lower blood pressure. If your blood pressure does not get lower with healthy choices, you may need to take medicine. This information is not intended to replace advice given to you by your health care provider. Make sure you discuss any questions you have with your health care provider. Document Revised: 07/03/2021 Document Reviewed: 07/03/2021 Elsevier Patient Education  2024 Elsevier Inc.  

## 2023-03-30 NOTE — Progress Notes (Signed)
Patient presents today for a bp check. She is currently taking irbesartan 300mg  and metoprolol 25mg  in the morning and amlodipine 5mg  in the evenings. Today her bp is 150/70 p64. I waited 10 minutes and did a recheck and it was 142/60 p67. Pt reports when she checked it this morning it was 142/67 p64.   Per provider: she is to continue with her current medications. Make sure she is decreasing her salt intake and cutting back on breads and cheeses as well. Pt will f/u with provider in 3 months.    BP Readings from Last 3 Encounters:  03/16/23 (!) 142/70  03/16/23 135/84  03/09/23 (!) 140/60    Repeat 142/60 p67

## 2023-04-04 ENCOUNTER — Other Ambulatory Visit: Payer: Self-pay | Admitting: Cardiovascular Disease

## 2023-04-09 ENCOUNTER — Telehealth: Payer: Self-pay | Admitting: *Deleted

## 2023-04-09 NOTE — Telephone Encounter (Signed)
   Pre-operative Risk Assessment    Patient Name: Hannah [name]  DOB: [birth year] MRN: [id number]      Request for Surgical Clearance    Procedure:   RIGHT TOTAL KNEE ARTHROPLASTY  Date of Surgery:  Clearance 07/12/23                                 Surgeon:  DR. Ollen Gross Surgeon's Group or Practice Name:  [facility] Phone number:  [phone] ATTN: Aida Raider Fax number:  [phone]   Type of Clearance Requested:   - Medical  - Pharmacy:  Hold Apixaban (Eliquis)     Type of Anesthesia:   CHOICE   Additional requests/questions:    [name]   04/09/2023, 10:29 AM

## 2023-04-12 NOTE — Telephone Encounter (Signed)
Patient with diagnosis of afib on Eliquis for anticoagulation.    Procedure: right total knee arthroplasty Date of procedure: 07/12/23  CHA2DS2-VASc Score = 5  This indicates a 7.2% annual risk of stroke. The patient's score is based upon: CHF History: 0 HTN History: 1 Diabetes History: 1 Stroke History: 0 Vascular Disease History: 0 Age Score: 2 Gender Score: 1   CrCl 57mL/min using adjusted body weight Platelet count 195K  Per office protocol, patient can hold Eliquis for 3 days prior to procedure.    **This guidance is not considered finalized until pre-operative APP has relayed final recommendations.**

## 2023-04-13 NOTE — Telephone Encounter (Signed)
   Name: Hannah [name]  DOB: [birth year]  MRN: [id number]  Primary Cardiologist: Kristeen Miss, MD   Preoperative team, please contact this patient and set up a phone call appointment for further preoperative risk assessment. Please obtain consent and complete medication review. Thank you for your help.  I confirm that guidance regarding antiplatelet and oral anticoagulation therapy has been completed and, if necessary, noted below.  Pharmacy has provided recommendations for holding anticoagulation.   Ronney Asters, NP 04/13/2023, 10:14 AM [facility]

## 2023-04-13 NOTE — Telephone Encounter (Signed)
Pt has appt 06/28/23 with Dr. Elease Hashimoto; procedure date is 07/12/23. Will check with pre op APP if tele appt still needed or clearance to be assessed at in office appt 06/28/23.

## 2023-04-14 NOTE — Telephone Encounter (Signed)
D/w the pre op APP Sikes, PAC. Per Reather Littler, PAC:  Clearance can be assessed at office appointment 06/28/23. Thank you

## 2023-04-18 ENCOUNTER — Other Ambulatory Visit: Payer: Self-pay | Admitting: Internal Medicine

## 2023-04-18 DIAGNOSIS — I10 Essential (primary) hypertension: Secondary | ICD-10-CM

## 2023-04-20 ENCOUNTER — Other Ambulatory Visit: Payer: Self-pay | Admitting: *Deleted

## 2023-04-20 DIAGNOSIS — M0579 Rheumatoid arthritis with rheumatoid factor of multiple sites without organ or systems involvement: Secondary | ICD-10-CM

## 2023-04-20 MED ORDER — HYDROXYCHLOROQUINE SULFATE 200 MG PO TABS: ORAL_TABLET | ORAL | 0 refills | Status: DC

## 2023-04-20 NOTE — Telephone Encounter (Signed)
Patient contacted the office requesting a refill on PLQ to be sent to the [facility] [place].  Last Fill: 02/10/2023  Eye exam: 12/24/2022 WNL   Labs: 02/08/2023 Creatinine is elevated and GFR is low-44.   Next Visit: 09/11/2023  Last Visit: 03/16/2023  DX: Other systemic lupus erythematosus with other organ involvement   Current Dose per lab note 03/10/2023: Hydroxychloroquine level is elevated-1,200.  Recommend reducing plaquenil to 1 tablet in the morning and half tablet in the evening Monday through Friday only.   Okay to refill Plaquenil?

## 2023-04-27 ENCOUNTER — Other Ambulatory Visit: Payer: Self-pay

## 2023-04-27 DIAGNOSIS — D472 Monoclonal gammopathy: Secondary | ICD-10-CM

## 2023-04-28 ENCOUNTER — Other Ambulatory Visit: Payer: Self-pay

## 2023-04-28 ENCOUNTER — Inpatient Hospital Stay: Payer: Medicare HMO | Attending: Hematology

## 2023-04-28 DIAGNOSIS — D472 Monoclonal gammopathy: Secondary | ICD-10-CM | POA: Insufficient documentation

## 2023-04-28 DIAGNOSIS — Z862 Personal history of diseases of the blood and blood-forming organs and certain disorders involving the immune mechanism: Secondary | ICD-10-CM | POA: Diagnosis not present

## 2023-04-28 LAB — CBC WITH DIFFERENTIAL (CANCER CENTER ONLY)
Abs Immature Granulocytes: 0.02 10*3/uL (ref 0.00–0.07)
Basophils Absolute: 0.1 10*3/uL (ref 0.0–0.1)
Basophils Relative: 1 %
Eosinophils Absolute: 0.5 10*3/uL (ref 0.0–0.5)
Eosinophils Relative: 8 %
HCT: 29.8 % — ABNORMAL LOW (ref 36.0–46.0)
Hemoglobin: 9.8 g/dL — ABNORMAL LOW (ref 12.0–15.0)
Immature Granulocytes: 0 %
Lymphocytes Relative: 16 %
Lymphs Abs: 1 10*3/uL (ref 0.7–4.0)
MCH: 31.6 pg (ref 26.0–34.0)
MCHC: 32.9 g/dL (ref 30.0–36.0)
MCV: 96.1 fL (ref 80.0–100.0)
Monocytes Absolute: 0.4 10*3/uL (ref 0.1–1.0)
Monocytes Relative: 7 %
Neutro Abs: 4 10*3/uL (ref 1.7–7.7)
Neutrophils Relative %: 68 %
Platelet Count: 168 10*3/uL (ref 150–400)
RBC: 3.1 MIL/uL — ABNORMAL LOW (ref 3.87–5.11)
RDW: 13.5 % (ref 11.5–15.5)
WBC Count: 6 10*3/uL (ref 4.0–10.5)
nRBC: 0 % (ref 0.0–0.2)

## 2023-04-28 LAB — CMP (CANCER CENTER ONLY)
ALT: 15 U/L (ref 0–44)
AST: 24 U/L (ref 15–41)
Albumin: 3.9 g/dL (ref 3.5–5.0)
Alkaline Phosphatase: 44 U/L (ref 38–126)
Anion gap: 7 (ref 5–15)
BUN: 24 mg/dL — ABNORMAL HIGH (ref 8–23)
CO2: 27 mmol/L (ref 22–32)
Calcium: 9.7 mg/dL (ref 8.9–10.3)
Chloride: 105 mmol/L (ref 98–111)
Creatinine: 1.37 mg/dL — ABNORMAL HIGH (ref 0.44–1.00)
GFR, Estimated: 40 mL/min — ABNORMAL LOW (ref >60)
Glucose, Bld: 96 mg/dL (ref 70–99)
Potassium: 4.4 mmol/L (ref 3.5–5.1)
Sodium: 139 mmol/L (ref 135–145)
Total Bilirubin: 0.6 mg/dL (ref 0.3–1.2)
Total Protein: 6.5 g/dL (ref 6.5–8.1)

## 2023-04-28 LAB — IRON AND IRON BINDING CAPACITY (CC-WL,HP ONLY)
Iron: 49 ug/dL (ref 28–170)
Saturation Ratios: 17 % (ref 10.4–31.8)
TIBC: 291 ug/dL (ref 250–450)
UIBC: 242 ug/dL (ref 148–442)

## 2023-04-28 LAB — FERRITIN: Ferritin: 26 ng/mL (ref 11–307)

## 2023-05-05 ENCOUNTER — Inpatient Hospital Stay: Payer: Medicare HMO | Admitting: Hematology

## 2023-05-06 ENCOUNTER — Inpatient Hospital Stay: Payer: Medicare HMO | Admitting: Hematology

## 2023-05-06 ENCOUNTER — Telehealth: Payer: Self-pay | Admitting: Hematology

## 2023-05-06 NOTE — Progress Notes (Incomplete)
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 Patient was last seen by me on 11/04/2022 and reported hand pain/tingling sensation due to rheumatid arthritis as well as coldness and mild white coloration in her fingertips occasionally.  Today,  -Discussed lab results on 05/06/2023 in detail with patient. CBC showed WBC of ***K, hemoglobin of ***, and platelets of ***K. -   REVIEW OF SYSTEMS:    10 Point review of Systems was done is negative except as noted above.   Past Medical History:  Diagnosis Date   Anemia    Arthritis    Asthma    Atrial fibrillation (HCC)    Chronic kidney disease    Diabetes mellitus without complication (HCC)    Gout 12/17/2014   patient reported   Hyperlipidemia    Hypertension    Hypothyroidism    Systemic lupus erythematosus (HCC)    Vitiligo   possible SLE/rheumatoid arthritis following with Dr.Deveshwar.  . Past Surgical History:  Procedure Laterality Date   CATARACT EXTRACTION Bilateral 2015   DILATION AND CURETTAGE OF UTERUS     DOPPLER ECHOCARDIOGRAPHY  05/2018   Internist to review with pt; potential heart murmur 06/20/18   keratosis removal  2021   KNEE CLOSED REDUCTION Left 07/06/2022   Procedure: CLOSED MANIPULATION KNEE;  Surgeon: Ollen Gross, MD;  Location: [facility];  Service: Orthopedics;  Laterality: Left;   REPLACEMENT TOTAL KNEE Left    SKIN SURGERY  11/30/2018   left side of face   TOOTH EXTRACTION     TOTAL KNEE  ARTHROPLASTY Left 02/02/2022   Procedure: TOTAL KNEE ARTHROPLASTY;  Surgeon: Ollen Gross, MD;  Location: [facility];  Service: Orthopedics;  Laterality: Left;    . Social History   Tobacco Use   Smoking status: Never    Passive exposure: Never   Smokeless tobacco: Never  Vaping Use   Vaping status: Never Used  Substance Use Topics   Alcohol use: Not Currently   Drug use: No    ALLERGIES:  is allergic to shellfish allergy.  MEDICATIONS:  Current Outpatient Medications  Medication Sig Dispense Refill   Accu-Chek Softclix Lancets lancets Use to check blood sugars daily E11.69 100 each 2   albuterol (PROVENTIL HFA;VENTOLIN HFA) 108 (90 Base) MCG/ACT inhaler Inhale 2 puffs into the lungs every 6 (six) hours as needed for wheezing or shortness of breath. 1 Inhaler 2   Alcohol Swabs (ALCOHOL PADS) 70 % PADS Use as directed to check blood sugars 1 time per day dx: e11.22 150 each 2   allopurinol (ZYLOPRIM) 100 MG tablet Take 1 tablet (100 mg total) by mouth 3 (three) times a week for 90 doses. 90 tablet 2   amLODipine (NORVASC) 5 MG tablet TAKE 1 TABLET(5 MG) BY MOUTH DAILY 30 tablet 1   apixaban (ELIQUIS) 5 MG TABS tablet Take 1 tablet (5 mg total) by mouth 2 (two) times daily. 180 tablet 1   Ascorbic Acid (VITAMIN C) 1000 MG tablet Take 1,000  mg by mouth daily.     atorvastatin (LIPITOR) 80 MG tablet TAKE 1 TABLET BY MOUTH EVERY DAY 90 tablet 1   budesonide-formoterol (SYMBICORT) 160-4.5 MCG/ACT inhaler INHALE 2 PUFFS BY MOUTH TWICE DAILY IN THE MORNING AND IN THE EVENING 10.2 g 6   Calcium Carb-Cholecalciferol 600-800 MG-UNIT TABS Take 1 tablet by mouth daily.      Cholecalciferol (VITAMIN D PO) Take 2,000 Units by mouth daily.     cyanocobalamin 1000 MCG tablet Take 1,000 mcg by mouth daily.     denosumab (PROLIA) 60 MG/ML SOSY injection Inject 60 mg into the skin every 6 (six) months. Courier to rheum: [address], [address], [place] [address]. Appt on 12/07/2022 1 mL 0    glucose blood (ACCU-CHEK GUIDE) test strip USE TO CHECK BLOOD SUGAR DAILY 100 strip 2   hydroxychloroquine (PLAQUENIL) 200 MG tablet TAKE 1 TABLET( 200 MG) BY MOUTH IN THE MORNING AND 1/2 TABLET (100 MG) IN THE EVENING MONDAY THROUGH FRIDAY ONLY. 90 tablet 0   Hypromellose (ARTIFICIAL TEARS OP) Apply 1 drop to eye daily as needed (dry eyes).     irbesartan (AVAPRO) 300 MG tablet TAKE 1 TABLET(300 MG) BY MOUTH DAILY 90 tablet 2   JARDIANCE 10 MG TABS tablet Take 10 mg by mouth daily.     metoprolol succinate (TOPROL-XL) 25 MG 24 hr tablet TAKE 1 TABLET(25 MG) BY MOUTH DAILY 90 tablet 3   SYNTHROID 88 MCG tablet TAKE 1 TABLET BY MOUTH EVERY DAY MONDAY TO SATURDAY AND OFF ON SUNDAYS 90 tablet 0   UNABLE TO FIND Med Name: iron infusions     No current facility-administered medications for this visit.    PHYSICAL EXAMINATION: ECOG PERFORMANCE STATUS: 1 - Symptomatic but completely ambulatory  There were no vitals filed for this visit.  There were no vitals filed for this visit. .There is no height or weight on file to calculate BMI.    GENERAL:alert, in no acute distress and comfortable SKIN: no acute rashes, no significant lesions EYES: conjunctiva are pink and non-injected, sclera anicteric OROPHARYNX: MMM, no exudates, no oropharyngeal erythema or ulceration NECK: supple, no JVD LYMPH:  no palpable lymphadenopathy in the cervical, axillary or inguinal regions LUNGS: clear to auscultation b/l with normal respiratory effort HEART: regular rate & rhythm ABDOMEN:  normoactive bowel sounds , non tender, not distended. Extremity: no pedal edema PSYCH: alert & oriented x 3 with fluent speech NEURO: no focal motor/sensory deficits   LABORATORY DATA:   I have reviewed the data as listed  .    Latest Ref Rng & Units 04/28/2023    7:30 AM 02/08/2023    1:42 PM 01/01/2023   12:00 AM  CBC  WBC 4.0 - 10.5 K/uL 6.0  5.5  10.7      Hemoglobin 12.0 - 15.0 g/dL 9.8  9.9  9.7      Hematocrit  36.0 - 46.0 % 29.8  30.1  29      Platelets 150 - 400 K/uL 168  195       This result is from an external source.   .    Latest Ref Rng & Units 04/28/2023    7:30 AM 03/03/2023    5:01 PM 02/08/2023    1:42 PM  CMP  Glucose 70 - 99 mg/dL 96  94  86   BUN 8 - 23 mg/dL 24  17  15    Creatinine 0.44 - 1.00 mg/dL 6.04  5.40  9.81  Sodium 135 - 145 mmol/L 139  145  143   Potassium 3.5 - 5.1 mmol/L 4.4  4.6  4.7   Chloride 98 - 111 mmol/L 105  107  108   CO2 22 - 32 mmol/L 27  26  28    Calcium 8.9 - 10.3 mg/dL 9.7  9.3  9.4   Total Protein 6.5 - 8.1 g/dL 6.5   6.3   Total Bilirubin 0.3 - 1.2 mg/dL 0.6   0.5   Alkaline Phos 38 - 126 U/L 44     AST 15 - 41 U/L 24   22   ALT 0 - 44 U/L 15   14    Lab Results  Component Value Date   IRON 49 04/28/2023   TIBC 291 04/28/2023   IRONPCTSAT 17 04/28/2023   (Iron and TIBC)  Lab Results  Component Value Date   FERRITIN 26 04/28/2023          RADIOGRAPHIC STUDIES: I have personally reviewed the radiological images as listed and agreed with the findings in the report.  METASTATIC BONE SURVEY 0/02/2015   COMPARISON:  None.   FINDINGS: Heart is normal size. Right diaphragmatic hernia again noted, unchanged. Lungs are clear. No effusions.   No focal lytic lesions within the visualized bony structures or acute bony abnormality. Degenerative changes within the shoulders, lower cervical spine, mid to lower thoracic spine, and lower lumbar spine. Advanced degenerative facet disease in the lower lumbar spine. 9 mm of anterolisthesis of L4 on L5. Endplate sclerosis within L4 and L5. Mild degenerative changes in the hips. Advanced degenerative changes within the knees. 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   IMPRESSION: No focal lytic lesion.   Degenerative joint disease involving multiple joints as described above.   Grade 2 anterolisthesis of L4 on L5 related to facet disease.   ASSESSMENT & PLAN:    77 year old of an American female with   #1 IgG kappa monoclonal gammopathy of undetermined significance. Bone survey X-ray showed no lytic lesions. M spike is 0.6 g/dL in 01/601 and then 0.9N/AT in 01/5731. SPEP in May/2017 and 07/2016 showed M proteinwas stable at 0.3g/dl  MGUS likely related to underlying connective tissue disorder.  Bone marrow biopsy does show about 13% plasma cells however these appear to be polyclonal and likely related to a possible underlying inflammatory disorder. Less likely a biclonal plasma cell dyscrasia.  #2 Normocytic normochromic anemia likely related to chronic inflammation from recent diagnosis of lupus/rheumatoid arthritis + CKD.  Increased acanthocytes on peripheral blood smear -no overt evidence of liver disease. Could be a marker of some MDS Bone marrow biopsy did not show overt plasma cell dyscrasia or myelodysplastic syndrome. 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 Bone marrow biopsy showed decreased iron stores. Ferritin and iron have been trending down, she has not noticed any issues with bruising or bleeding recently. ?absorption issue. I have given her the option of IV iron which she has previously had without issue. She would like to have this.   PLAN: -Discussed lab results from 10/28/2022 with the patient. CBC shows slightly decreased at 9.9 (which is her normal) and hematocrit of 29.8. CMP is stable. Patient is mildly anemic.  Ferritin is around 26.  Kappa/lambda light chain is 0.20.  M-protein is 0.3g/dl -Patient will get IV Iron infusions. 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  FOLLOW-UP: ***  The total time spent in the appointment was *** minutes* .  All of the patient's questions were answered with apparent satisfaction. The patient  knows to call the clinic with any problems, questions or concerns.   Wyvonnia Lora MD MS AAHIVMS [facility] Hematology/Oncology Physician [facility]  .*Total Encounter Time as defined by the Centers for Medicare and Medicaid Services includes, in addition to the face-to-face time of a patient visit (documented in the note above) non-face-to-face time: obtaining and reviewing outside history, ordering and reviewing medications, tests or procedures, care coordination (communications with other health care professionals or caregivers) and documentation in the medical record.    I,Hannah [name],acting as a Neurosurgeon for Wyvonnia Lora, MD.,have documented all relevant documentation on the behalf of Wyvonnia Lora, MD,as directed by  Wyvonnia Lora, MD while in the presence of Wyvonnia Lora, MD.  ***

## 2023-05-10 DIAGNOSIS — M545 Low back pain, unspecified: Secondary | ICD-10-CM | POA: Diagnosis not present

## 2023-05-10 DIAGNOSIS — M5416 Radiculopathy, lumbar region: Secondary | ICD-10-CM | POA: Diagnosis not present

## 2023-05-10 DIAGNOSIS — M5459 Other low back pain: Secondary | ICD-10-CM | POA: Diagnosis not present

## 2023-05-10 DIAGNOSIS — M47896 Other spondylosis, lumbar region: Secondary | ICD-10-CM | POA: Diagnosis not present

## 2023-05-12 DIAGNOSIS — Z1231 Encounter for screening mammogram for malignant neoplasm of breast: Secondary | ICD-10-CM | POA: Diagnosis not present

## 2023-05-12 LAB — HM MAMMOGRAPHY

## 2023-05-13 ENCOUNTER — Other Ambulatory Visit (HOSPITAL_COMMUNITY): Payer: Self-pay

## 2023-05-13 ENCOUNTER — Inpatient Hospital Stay: Payer: Medicare HMO | Admitting: Hematology

## 2023-05-14 ENCOUNTER — Encounter: Payer: Self-pay | Admitting: Internal Medicine

## 2023-05-17 ENCOUNTER — Inpatient Hospital Stay: Payer: Medicare HMO | Attending: Hematology | Admitting: Hematology

## 2023-05-17 VITALS — BP 122/59 | HR 67 | Temp 98.2°F | Resp 18 | Wt 169.7 lb

## 2023-05-17 DIAGNOSIS — E1122 Type 2 diabetes mellitus with diabetic chronic kidney disease: Secondary | ICD-10-CM | POA: Diagnosis not present

## 2023-05-17 DIAGNOSIS — M47816 Spondylosis without myelopathy or radiculopathy, lumbar region: Secondary | ICD-10-CM | POA: Diagnosis not present

## 2023-05-17 DIAGNOSIS — I129 Hypertensive chronic kidney disease with stage 1 through stage 4 chronic kidney disease, or unspecified chronic kidney disease: Secondary | ICD-10-CM | POA: Diagnosis not present

## 2023-05-17 DIAGNOSIS — J45909 Unspecified asthma, uncomplicated: Secondary | ICD-10-CM | POA: Insufficient documentation

## 2023-05-17 DIAGNOSIS — Z7901 Long term (current) use of anticoagulants: Secondary | ICD-10-CM | POA: Diagnosis not present

## 2023-05-17 DIAGNOSIS — D508 Other iron deficiency anemias: Secondary | ICD-10-CM | POA: Diagnosis not present

## 2023-05-17 DIAGNOSIS — M545 Low back pain, unspecified: Secondary | ICD-10-CM | POA: Insufficient documentation

## 2023-05-17 DIAGNOSIS — Z79890 Hormone replacement therapy (postmenopausal): Secondary | ICD-10-CM | POA: Diagnosis not present

## 2023-05-17 DIAGNOSIS — Z7951 Long term (current) use of inhaled steroids: Secondary | ICD-10-CM | POA: Insufficient documentation

## 2023-05-17 DIAGNOSIS — R202 Paresthesia of skin: Secondary | ICD-10-CM | POA: Diagnosis not present

## 2023-05-17 DIAGNOSIS — N183 Chronic kidney disease, stage 3 unspecified: Secondary | ICD-10-CM | POA: Diagnosis not present

## 2023-05-17 DIAGNOSIS — Z79899 Other long term (current) drug therapy: Secondary | ICD-10-CM | POA: Insufficient documentation

## 2023-05-17 DIAGNOSIS — M4316 Spondylolisthesis, lumbar region: Secondary | ICD-10-CM | POA: Insufficient documentation

## 2023-05-17 DIAGNOSIS — I4891 Unspecified atrial fibrillation: Secondary | ICD-10-CM | POA: Insufficient documentation

## 2023-05-17 DIAGNOSIS — M3214 Glomerular disease in systemic lupus erythematosus: Secondary | ICD-10-CM | POA: Diagnosis not present

## 2023-05-17 DIAGNOSIS — D472 Monoclonal gammopathy: Secondary | ICD-10-CM | POA: Diagnosis not present

## 2023-05-17 DIAGNOSIS — D649 Anemia, unspecified: Secondary | ICD-10-CM | POA: Diagnosis not present

## 2023-05-17 NOTE — Progress Notes (Addendum)
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 is here for followup for her MGUS and anemia. Patient was last seen by me on 11/04/2022 and she complained of bilateral hand tingling sensation due to rheumatid arthritis, but she was doing well overall.   Patient notes she has been doing well overall without any new or severe medical concerns since our last visit. She notes she is still taking Eliquis.   She denies any recent infection issues, fever, chills, night sweats, back pain, abnormal bowel movement, blood in stool, black stool, hematuria, chest pain, abdominal pain, or leg swelling. She does complain of fatigue.  She reports she had severe lower back pain around 1-2 weeks ago. She followed up with her Orthopedic physician, who prescribed her steroid. Patient notes that her back pain improved with steroids.   Patient reports she has been to a Gastroenterologist in the past and has had colonoscopy, which did not show any abnormality.    She notes she has knee replacement surgery planned in October.   Patient is also planning to travel to [place] next month.    REVIEW OF SYSTEMS:   10 Point review of Systems was done is negative except as noted above.  . Past Medical History:  Diagnosis Date   Anemia    Arthritis    Asthma    Atrial fibrillation (HCC)    Chronic kidney disease    Diabetes mellitus without complication (HCC)    Gout 12/17/2014   patient reported   Hyperlipidemia    Hypertension    Hypothyroidism     Systemic lupus erythematosus (HCC)    Vitiligo   possible SLE/rheumatoid arthritis following with Dr.Deveshwar.  . Past Surgical History:  Procedure Laterality Date   CATARACT EXTRACTION Bilateral 2015   DILATION AND CURETTAGE OF UTERUS     DOPPLER ECHOCARDIOGRAPHY  05/2018   Internist to review with pt; potential heart murmur 06/20/18   keratosis removal  2021   KNEE CLOSED REDUCTION Left 07/06/2022   Procedure: CLOSED MANIPULATION KNEE;  Surgeon: Ollen Gross, MD;  Location: [facility];  Service: Orthopedics;  Laterality: Left;   REPLACEMENT TOTAL KNEE Left    SKIN SURGERY  11/30/2018   left side of face   TOOTH EXTRACTION     TOTAL KNEE ARTHROPLASTY Left 02/02/2022   Procedure: TOTAL KNEE ARTHROPLASTY;  Surgeon: Ollen Gross, MD;  Location: [facility];  Service: Orthopedics;  Laterality: Left;    . Social History   Tobacco Use   Smoking status: Never    Passive exposure: Never   Smokeless tobacco: Never  Vaping Use   Vaping status: Never Used  Substance Use Topics   Alcohol use: Not Currently   Drug use: No    ALLERGIES:  is allergic to shellfish allergy.  MEDICATIONS:  Current Outpatient Medications  Medication Sig Dispense Refill   Accu-Chek Softclix Lancets lancets Use to check blood sugars daily E11.69 100 each 2   albuterol (PROVENTIL HFA;VENTOLIN HFA) 108 (90 Base) MCG/ACT inhaler Inhale 2 puffs into the lungs every  6 (six) hours as needed for wheezing or shortness of breath. 1 Inhaler 2   Alcohol Swabs (ALCOHOL PADS) 70 % PADS Use as directed to check blood sugars 1 time per day dx: e11.22 150 each 2   allopurinol (ZYLOPRIM) 100 MG tablet Take 1 tablet (100 mg total) by mouth 3 (three) times a week for 90 doses. 90 tablet 2   amLODipine (NORVASC) 5 MG tablet TAKE 1 TABLET(5 MG) BY MOUTH DAILY 30 tablet 1   apixaban (ELIQUIS) 5 MG TABS tablet Take 1 tablet (5 mg total) by mouth 2 (two) times daily. 180 tablet 1   Ascorbic Acid (VITAMIN C) 1000 MG tablet Take 1,000  mg by mouth daily.     atorvastatin (LIPITOR) 80 MG tablet TAKE 1 TABLET BY MOUTH EVERY DAY 90 tablet 1   budesonide-formoterol (SYMBICORT) 160-4.5 MCG/ACT inhaler INHALE 2 PUFFS BY MOUTH TWICE DAILY IN THE MORNING AND IN THE EVENING 10.2 g 6   Calcium Carb-Cholecalciferol 600-800 MG-UNIT TABS Take 1 tablet by mouth daily.      Cholecalciferol (VITAMIN D PO) Take 2,000 Units by mouth daily.     cyanocobalamin 1000 MCG tablet Take 1,000 mcg by mouth daily.     denosumab (PROLIA) 60 MG/ML SOSY injection Inject 60 mg into the skin every 6 (six) months. Courier to rheum: [address], [address], [place] [address]. Appt on 12/07/2022 1 mL 0   glucose blood (ACCU-CHEK GUIDE) test strip USE TO CHECK BLOOD SUGAR DAILY 100 strip 2   hydroxychloroquine (PLAQUENIL) 200 MG tablet TAKE 1 TABLET( 200 MG) BY MOUTH IN THE MORNING AND 1/2 TABLET (100 MG) IN THE EVENING MONDAY THROUGH FRIDAY ONLY. 90 tablet 0   Hypromellose (ARTIFICIAL TEARS OP) Apply 1 drop to eye daily as needed (dry eyes).     irbesartan (AVAPRO) 300 MG tablet TAKE 1 TABLET(300 MG) BY MOUTH DAILY 90 tablet 2   JARDIANCE 10 MG TABS tablet Take 10 mg by mouth daily.     metoprolol succinate (TOPROL-XL) 25 MG 24 hr tablet TAKE 1 TABLET(25 MG) BY MOUTH DAILY 90 tablet 3   SYNTHROID 88 MCG tablet TAKE 1 TABLET BY MOUTH EVERY DAY MONDAY TO SATURDAY AND OFF ON SUNDAYS 90 tablet 0   UNABLE TO FIND Med Name: iron infusions     No current facility-administered medications for this visit.    PHYSICAL EXAMINATION: ECOG PERFORMANCE STATUS: 1 - Symptomatic but completely ambulatory  Vitals:   05/17/23 0920  BP: (!) 122/59  Pulse: 67  Resp: 18  Temp: 98.2 F (36.8 C)  SpO2: 100%    Filed Weights   05/17/23 0920  Weight: 169 lb 11.2 oz (77 kg)   .Body mass index is 35.47 kg/m.   NAD GENERAL:alert, in no acute distress and comfortable SKIN: no acute rashes, no significant lesions EYES: conjunctiva are pink and non-injected, sclera  anicteric OROPHARYNX: MMM, no exudates, no oropharyngeal erythema or ulceration NECK: supple, no JVD LYMPH:  no palpable lymphadenopathy in the cervical, axillary or inguinal regions LUNGS: clear to auscultation b/l with normal respiratory effort HEART: regular rate & rhythm ABDOMEN:  normoactive bowel sounds , non tender, not distended. No palpable hepatosplenomegaly.  Extremity: no pedal edema PSYCH: alert & oriented x 3 with fluent speech NEURO: no focal motor/sensory deficits  LABORATORY DATA:   I have reviewed the data as listed  .    Latest Ref Rng & Units 04/28/2023    7:30 AM 02/08/2023    1:42 PM  01/01/2023   12:00 AM  CBC  WBC 4.0 - 10.5 K/uL 6.0  5.5  10.7      Hemoglobin 12.0 - 15.0 g/dL 9.8  9.9  9.7      Hematocrit 36.0 - 46.0 % 29.8  30.1  29      Platelets 150 - 400 K/uL 168  195       This result is from an external source.   .    Latest Ref Rng & Units 04/28/2023    7:30 AM 03/03/2023    5:01 PM 02/08/2023    1:42 PM  CMP  Glucose 70 - 99 mg/dL 96  94  86   BUN 8 - 23 mg/dL 24  17  15    Creatinine 0.44 - 1.00 mg/dL 4.09  8.11  9.14   Sodium 135 - 145 mmol/L 139  145  143   Potassium 3.5 - 5.1 mmol/L 4.4  4.6  4.7   Chloride 98 - 111 mmol/L 105  107  108   CO2 22 - 32 mmol/L 27  26  28    Calcium 8.9 - 10.3 mg/dL 9.7  9.3  9.4   Total Protein 6.5 - 8.1 g/dL 6.5   6.3   Total Bilirubin 0.3 - 1.2 mg/dL 0.6   0.5   Alkaline Phos 38 - 126 U/L 44     AST 15 - 41 U/L 24   22   ALT 0 - 44 U/L 15   14    Lab Results  Component Value Date   IRON 49 04/28/2023   TIBC 291 04/28/2023   IRONPCTSAT 17 04/28/2023   (Iron and TIBC)  Lab Results  Component Value Date   FERRITIN 26 04/28/2023          RADIOGRAPHIC STUDIES: I have personally reviewed the radiological images as listed and agreed with the findings in the report.  METASTATIC BONE SURVEY 0/02/2015   COMPARISON:  None.   FINDINGS: Heart is normal size. Right diaphragmatic hernia again  noted, unchanged. Lungs are clear. No effusions.   No focal lytic lesions within the visualized bony structures or acute bony abnormality. Degenerative changes within the shoulders, lower cervical spine, mid to lower thoracic spine, and lower lumbar spine. Advanced degenerative facet disease in the lower lumbar spine. 9 mm of anterolisthesis of L4 on L5. Endplate sclerosis within L4 and L5. Mild degenerative changes in the hips. Advanced degenerative changes within the knees. 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   IMPRESSION: No focal lytic lesion.   Degenerative joint disease involving multiple joints as described above.   Grade 2 anterolisthesis of L4 on L5 related to facet disease.   ASSESSMENT & PLAN:   77 year old of an American female with   #1 IgG kappa monoclonal gammopathy of undetermined significance. Bone survey X-ray showed no lytic lesions. M spike is 0.6 g/dL in 78/295 and then 6.2Z/HY in 04/6577. SPEP in May/2017 and 07/2016 showed M proteinwas stable at 0.3g/dl  MGUS likely related to underlying connective tissue disorder.  Bone marrow biopsy does show about 13% plasma cells however these appear to be polyclonal and likely related to a possible underlying inflammatory disorder. Less likely a biclonal plasma cell dyscrasia.  #2 Normocytic normochromic anemia likely related to chronic inflammation from recent diagnosis of lupus/rheumatoid arthritis + CKD.  Increased acanthocytes on peripheral blood smear -no overt evidence of liver disease. Could be a marker of some MDS Bone marrow biopsy did not show overt  plasma cell dyscrasia or myelodysplastic syndrome. 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 Bone marrow biopsy showed decreased iron stores. Ferritin and iron have been trending down, she has not noticed any issues with bruising or bleeding recently. ?absorption issue.  I have given her the option of IV iron which she has previously had without issue. She would like to have this.   PLAN: -Discussed lab results from 04/28/2023 with the patient. CBC shows that the patient is anemic with hemoglobin of 9.8 g/dL and hematocrit of 40.9%. CMP shows elevated creatinine at 1.37. Ferritin level at 26 ng/mL. Iron saturation at 17%.  -Discussed multiple myeloma panel results from 04/28/2023 with the patient, which shows M-protein stable at 0.3.  -Discussed Kappa/lambda results from 04/28/2023 which shows light chain ratio at 0.14.  -Discussed the risk factors of iron deficiency, which are her Eliquis, her rheumatid arthritis, and chronic inflammation.  -recommend to follow-up with PCP regarding stool study. -Recommend to follow-up with cardiologist regarding Eliquis dosage since Eliquis is a risk factor for blood loss.  -Schedule patient for IV Iron.   -Patient will get IV Iron infusions. IV Venofer 300mg  weekly x 3 doses for maintain ferritin>100   FOLLOW-UP:  IV Venofer 300mg  weekly x 3 doses RTC with Dr Candise Che with labs in second week of January 2025   The total time spent in the appointment was 30 minutes* .  All of the patient's questions were answered with apparent satisfaction. The patient knows to call the clinic with any problems, questions or concerns.   Wyvonnia Lora MD MS AAHIVMS [facility] Hematology/Oncology Physician [facility]  .*Total Encounter Time as defined by the Centers for Medicare and Medicaid Services includes, in addition to the face-to-face time of a patient visit (documented in the note above) non-face-to-face time: obtaining and reviewing outside history, ordering and reviewing medications, tests or procedures, care coordination (communications with other health care professionals or caregivers) and documentation in the medical record.   I,Param Shah,acting as a Neurosurgeon for Wyvonnia Lora, MD.,have documented all relevant  documentation on the behalf of Wyvonnia Lora, MD,as directed by  Wyvonnia Lora, MD while in the presence of Wyvonnia Lora, MD.   .I have reviewed the above documentation for accuracy and completeness, and I agree with the above. Johney Maine MD

## 2023-05-21 ENCOUNTER — Telehealth: Payer: Self-pay | Admitting: Hematology

## 2023-05-23 ENCOUNTER — Encounter: Payer: Self-pay | Admitting: Hematology

## 2023-05-24 ENCOUNTER — Telehealth: Payer: Self-pay | Admitting: Rheumatology

## 2023-05-24 NOTE — Telephone Encounter (Signed)
Pt would like to schedule an appt in September for her prolia shot due to it being . Pt also would like to know if she would have to get her labs done here even though she got them done at the cancer center at [facility] long. Pt will be gone on September 15th-21st and would like it done before then.

## 2023-05-25 ENCOUNTER — Encounter: Payer: Self-pay | Admitting: Hematology

## 2023-05-25 ENCOUNTER — Other Ambulatory Visit (HOSPITAL_COMMUNITY): Payer: Self-pay

## 2023-05-25 NOTE — Telephone Encounter (Signed)
Precert form completed. Faxed to [facility]  Fax: [phone] Phone: [phone] EOC ID: [id number]  Chesley Mires, PharmD, MPH, BCPS, CPP Clinical Pharmacist (Rheumatology and Pulmonology)

## 2023-05-25 NOTE — Telephone Encounter (Signed)
Patient has questions regarding her Prolia cost.

## 2023-05-25 NOTE — Telephone Encounter (Signed)
Received notification from [facility] regarding a pre-certification for PROLIA. Authorization has been APPROVED from 05/25/23 to 09/28/23 for [facility]. Approval letter sent to scan center.  Reference # [id number] Phone # [phone]  [employer] will pick up 80% of cost of injection and patient will have 20% coinsurance after Part B deductible is met  Chesley Mires, PharmD, MPH, BCPS, CPP Clinical Pharmacist (Rheumatology and Pulmonology)

## 2023-05-25 NOTE — Telephone Encounter (Signed)
Patient is due for Prolia on 06/05/2023. Calcium wnl on 04/28/2023. Patient's copay for Prolia has increased to > $400. She states she is unable to afford this and inquired if she needed to stay on Prolia. Advised that ideally she remains adherent to treatment due to risk for atypical fractures with 2-3 month delay or discontinuation of treatment. Recommended she receive at [facility] to allow for her to set up payment plan rather than be responsible for paying lump sum. She has knee arthroplasty scheduled in October 2024 and is concerned about accumulation of medical bills  She will stop by Thursday to have labs completed. Will need both Prolia and lupus labs completed: Vitamin D, CBC, CMP, C3 andC4, protein-creatinine ratio (urine), Anti-DNA antibody, sed rate  Chesley Mires, PharmD, MPH, BCPS, CPP Clinical Pharmacist (Rheumatology and Pulmonology)

## 2023-05-25 NOTE — Addendum Note (Signed)
Addended by: Domenic Schwab on: 05/25/2023 01:06 PM   Modules accepted: Orders

## 2023-05-26 ENCOUNTER — Other Ambulatory Visit: Payer: Self-pay | Admitting: *Deleted

## 2023-05-26 DIAGNOSIS — Z79899 Other long term (current) drug therapy: Secondary | ICD-10-CM

## 2023-05-26 DIAGNOSIS — M3219 Other organ or system involvement in systemic lupus erythematosus: Secondary | ICD-10-CM

## 2023-05-26 DIAGNOSIS — E559 Vitamin D deficiency, unspecified: Secondary | ICD-10-CM

## 2023-05-26 NOTE — Telephone Encounter (Signed)
Patient stopped by clinic for labs and requested to speak regarding Prolia benefits. She stated she called [employer] this morning and was told that cost of Prolia was $95. I explained in detail the differences in billing when completed at hospital [facility] (buy and bill) and in-office (pharmacy benefit and medication is billed by pharmacy). I called insurance with patient to ensure we were all on the same page with coverage of Prolia. Insurance now told patient it would be as quotes >$400 through pharmacy benefit because she is in coverage gap and through medical benefit her cost would be about $385. Patient agreeable to setting up payment plan with [facility] and receiving there this time around. We are still unsure who she spoke with this morning and why she was quoted in correct price.  We discussed different treatment options. She has been on alendronate in past and had worsening of T-score so was switched to Prolia. She would be a candidate for Reclast. We reviewed risk for atypical fractures with Prolia discontinuation or delay in treatment. However we also discussed that if she cannot afford treatment, we would not want her to break the bank and rather switch her to a more affordable option.  Order for Prolia at [facility] will be placed after labs from today have resulted  Chesley Mires, PharmD, MPH, BCPS, CPP Clinical Pharmacist (Rheumatology and Pulmonology)

## 2023-05-27 LAB — COMPLETE METABOLIC PANEL WITH GFR
AG Ratio: 1.5 (calc) (ref 1.0–2.5)
ALT: 16 U/L (ref 6–29)
AST: 20 U/L (ref 10–35)
Albumin: 3.8 g/dL (ref 3.6–5.1)
Alkaline phosphatase (APISO): 56 U/L (ref 37–153)
BUN/Creatinine Ratio: 15 (calc) (ref 6–22)
BUN: 23 mg/dL (ref 7–25)
CO2: 29 mmol/L (ref 20–32)
Calcium: 9.8 mg/dL (ref 8.6–10.4)
Chloride: 105 mmol/L (ref 98–110)
Creat: 1.57 mg/dL — ABNORMAL HIGH (ref 0.60–1.00)
Globulin: 2.5 g/dL (ref 1.9–3.7)
Glucose, Bld: 128 mg/dL — ABNORMAL HIGH (ref 65–99)
Potassium: 4.4 mmol/L (ref 3.5–5.3)
Sodium: 141 mmol/L (ref 135–146)
Total Bilirubin: 0.4 mg/dL (ref 0.2–1.2)
Total Protein: 6.3 g/dL (ref 6.1–8.1)
eGFR: 34 mL/min/{1.73_m2} — ABNORMAL LOW (ref >=60)

## 2023-05-27 LAB — CBC WITH DIFFERENTIAL/PLATELET
Absolute Monocytes: 496 {cells}/uL (ref 200–950)
Basophils Absolute: 48 {cells}/uL (ref 0–200)
Basophils Relative: 0.7 %
Eosinophils Absolute: 258 {cells}/uL (ref 15–500)
Eosinophils Relative: 3.8 %
HCT: 31.2 % — ABNORMAL LOW (ref 35.0–45.0)
Hemoglobin: 10.1 g/dL — ABNORMAL LOW (ref 11.7–15.5)
Lymphs Abs: 993 {cells}/uL (ref 850–3900)
MCH: 30.7 pg (ref 27.0–33.0)
MCHC: 32.4 g/dL (ref 32.0–36.0)
MCV: 94.8 fL (ref 80.0–100.0)
MPV: 12 fL (ref 7.5–12.5)
Monocytes Relative: 7.3 %
Neutro Abs: 5005 {cells}/uL (ref 1500–7800)
Neutrophils Relative %: 73.6 %
Platelets: 181 10*3/uL (ref 140–400)
RBC: 3.29 10*6/uL — ABNORMAL LOW (ref 3.80–5.10)
RDW: 13.5 % (ref 11.0–15.0)
Total Lymphocyte: 14.6 %
WBC: 6.8 10*3/uL (ref 3.8–10.8)

## 2023-05-27 LAB — PROTEIN / CREATININE RATIO, URINE
Creatinine, Urine: 161 mg/dL (ref 20–275)
Protein/Creat Ratio: 298 mg/g{creat} — ABNORMAL HIGH (ref 24–184)
Protein/Creatinine Ratio: 0.298 mg/mg{creat} — ABNORMAL HIGH (ref 0.024–0.184)
Total Protein, Urine: 48 mg/dL — ABNORMAL HIGH (ref 5–24)

## 2023-05-27 LAB — VITAMIN D 25 HYDROXY (VIT D DEFICIENCY, FRACTURES): Vit D, 25-Hydroxy: 57 ng/mL (ref 30–100)

## 2023-05-27 LAB — C3 AND C4
C3 Complement: 142 mg/dL (ref 83–193)
C4 Complement: 44 mg/dL (ref 15–57)

## 2023-05-27 LAB — ANTI-DNA ANTIBODY, DOUBLE-STRANDED: ds DNA Ab: 28 [IU]/mL — ABNORMAL HIGH

## 2023-05-27 LAB — SEDIMENTATION RATE: Sed Rate: 34 mm/h — ABNORMAL HIGH (ref 0–30)

## 2023-05-28 ENCOUNTER — Inpatient Hospital Stay: Payer: Medicare HMO

## 2023-05-28 ENCOUNTER — Telehealth: Payer: Self-pay | Admitting: *Deleted

## 2023-05-28 ENCOUNTER — Other Ambulatory Visit: Payer: Self-pay | Admitting: Pharmacist

## 2023-05-28 DIAGNOSIS — M810 Age-related osteoporosis without current pathological fracture: Secondary | ICD-10-CM

## 2023-05-28 DIAGNOSIS — Z79899 Other long term (current) drug therapy: Secondary | ICD-10-CM

## 2023-05-28 MED FILL — Iron Sucrose Inj 20 MG/ML (Fe Equiv): INTRAVENOUS | Qty: 15 | Status: AC

## 2023-05-28 NOTE — Telephone Encounter (Signed)
Labs stable to proceed w/ Prolia. Order placed at [facility] and pt provided with information  Chesley Mires, PharmD, MPH, BCPS, CPP Clinical Pharmacist (Rheumatology and Pulmonology)\

## 2023-05-28 NOTE — Progress Notes (Signed)
Next Prolia SQ due on 06/05/23. Diagnosis: age-related osteoporosis  Dose: 60 mg SQ every 6 months  Last Clinic Visit: 03/16/2023 Next Clinic Visit: 09/08/23  Last Prolia dose: 12/07/22  Labs: 05/26/23 - CBC, CMP< Vitamin D Last DEXA: 09/03/2022 Next DEXA due: December 2025  Orders placed for Prolia x 1 dose. No premedicatons required.   Called patient and provided with phone number for: [facility] [phone]) Ginette Otto  Will follow-up to ensured scheduled and completed  Chesley Mires, PharmD, MPH, BCPS, CPP Clinical Pharmacist (Rheumatology and Pulmonology)

## 2023-05-28 NOTE — Progress Notes (Signed)
Prolia scheduled for 06/10/23

## 2023-05-28 NOTE — Progress Notes (Signed)
ESR is borderline elevated-34.   dsDNA remains positive but is stable.  Glucose is 128.  Creatinine is elevated-1.57 and GFR is low-34.  Avoid the use of NSAIDs. Please clarify if she has had any medication changes? Please forward results to nephrology  Total urine protein remains elevated.  Protein creatinine ratio is borderline elevated-please forward results to nephrology.   Vitamin D WNL.  Complements WNL.

## 2023-05-28 NOTE — Telephone Encounter (Signed)
Patient contacted the office to advise she had labs and evaluation with hematologist 2 weeks ago. She states she will be having iron infusions with the next 3 weeks.

## 2023-05-29 ENCOUNTER — Inpatient Hospital Stay: Payer: Medicare HMO

## 2023-05-29 ENCOUNTER — Other Ambulatory Visit: Payer: Self-pay

## 2023-05-29 VITALS — BP 132/75 | HR 55 | Temp 98.4°F | Resp 16

## 2023-05-29 DIAGNOSIS — I129 Hypertensive chronic kidney disease with stage 1 through stage 4 chronic kidney disease, or unspecified chronic kidney disease: Secondary | ICD-10-CM | POA: Diagnosis not present

## 2023-05-29 DIAGNOSIS — R202 Paresthesia of skin: Secondary | ICD-10-CM | POA: Diagnosis not present

## 2023-05-29 DIAGNOSIS — D508 Other iron deficiency anemias: Secondary | ICD-10-CM

## 2023-05-29 DIAGNOSIS — D649 Anemia, unspecified: Secondary | ICD-10-CM | POA: Diagnosis not present

## 2023-05-29 DIAGNOSIS — E1122 Type 2 diabetes mellitus with diabetic chronic kidney disease: Secondary | ICD-10-CM | POA: Diagnosis not present

## 2023-05-29 DIAGNOSIS — I4891 Unspecified atrial fibrillation: Secondary | ICD-10-CM | POA: Diagnosis not present

## 2023-05-29 DIAGNOSIS — M545 Low back pain, unspecified: Secondary | ICD-10-CM | POA: Diagnosis not present

## 2023-05-29 DIAGNOSIS — J45909 Unspecified asthma, uncomplicated: Secondary | ICD-10-CM | POA: Diagnosis not present

## 2023-05-29 DIAGNOSIS — D472 Monoclonal gammopathy: Secondary | ICD-10-CM | POA: Diagnosis not present

## 2023-05-29 DIAGNOSIS — N183 Chronic kidney disease, stage 3 unspecified: Secondary | ICD-10-CM | POA: Diagnosis not present

## 2023-05-29 MED ORDER — LORATADINE 10 MG PO TABS
10.0000 mg | ORAL_TABLET | Freq: Once | ORAL | Status: AC
  Administered 2023-05-29: 10 mg via ORAL
  Filled 2023-05-29: qty 1

## 2023-05-29 MED ORDER — ACETAMINOPHEN 325 MG PO TABS
650.0000 mg | ORAL_TABLET | Freq: Once | ORAL | Status: AC
  Administered 2023-05-29: 650 mg via ORAL
  Filled 2023-05-29: qty 2

## 2023-05-29 MED ORDER — SODIUM CHLORIDE 0.9 % IV SOLN: Freq: Once | INTRAVENOUS | Status: AC

## 2023-05-29 MED ORDER — SODIUM CHLORIDE 0.9 % IV SOLN
300.0000 mg | Freq: Once | INTRAVENOUS | Status: AC
  Administered 2023-05-29: 300 mg via INTRAVENOUS
  Filled 2023-05-29: qty 300

## 2023-05-29 NOTE — Patient Instructions (Signed)
Iron Sucrose Injection What is this medication? IRON SUCROSE (EYE ern SOO krose) treats low levels of iron (iron deficiency anemia) in people with kidney disease. Iron is a mineral that plays an important role in making red blood cells, which carry oxygen from your lungs to the rest of your body. This medicine may be used for other purposes; ask your health care provider or pharmacist if you have questions. COMMON BRAND NAME(S): Venofer What should I tell my care team before I take this medication? They need to know if you have any of these conditions: Anemia not caused by low iron levels Heart disease High levels of iron in the blood Kidney disease Liver disease An unusual or allergic reaction to iron, other medications, foods, dyes, or preservatives Pregnant or trying to get pregnant Breastfeeding How should I use this medication? This medication is for infusion into a vein. It is given in a hospital or clinic setting. Talk to your care team about the use of this medication in children. While this medication may be prescribed for children as young as 2 years for selected conditions, precautions do apply. Overdosage: If you think you have taken too much of this medicine contact a poison control center or emergency room at once. NOTE: This medicine is only for you. Do not share this medicine with others. What if I miss a dose? Keep appointments for follow-up doses. It is important not to miss your dose. Call your care team if you are unable to keep an appointment. What may interact with this medication? Do not take this medication with any of the following: Deferoxamine Dimercaprol Other iron products This medication may also interact with the following: Chloramphenicol Deferasirox This list may not describe all possible interactions. Give your health care provider a list of all the medicines, herbs, non-prescription drugs, or dietary supplements you use. Also tell them if you smoke,  drink alcohol, or use illegal drugs. Some items may interact with your medicine. What should I watch for while using this medication? Visit your care team regularly. Tell your care team if your symptoms do not start to get better or if they get worse. You may need blood work done while you are taking this medication. You may need to follow a special diet. Talk to your care team. Foods that contain iron include: whole grains/cereals, dried fruits, beans, or peas, leafy green vegetables, and organ meats (liver, kidney). What side effects may I notice from receiving this medication? Side effects that you should report to your care team as soon as possible: Allergic reactions--skin rash, itching, hives, swelling of the face, lips, tongue, or throat Low blood pressure--dizziness, feeling faint or lightheaded, blurry vision Shortness of breath Side effects that usually do not require medical attention (report to your care team if they continue or are bothersome): Flushing Headache Joint pain Muscle pain Nausea Pain, redness, or irritation at injection site This list may not describe all possible side effects. Call your doctor for medical advice about side effects. You may report side effects to FDA at 1-800-FDA-1088. Where should I keep my medication? This medication is given in a hospital or clinic and will not be stored at home. NOTE: This sheet is a summary. It may not cover all possible information. If you have questions about this medicine, talk to your doctor, pharmacist, or health care provider.  2023 Elsevier/Gold Standard (2020-12-26 00:00:00)  

## 2023-06-03 DIAGNOSIS — M329 Systemic lupus erythematosus, unspecified: Secondary | ICD-10-CM | POA: Diagnosis not present

## 2023-06-03 DIAGNOSIS — I129 Hypertensive chronic kidney disease with stage 1 through stage 4 chronic kidney disease, or unspecified chronic kidney disease: Secondary | ICD-10-CM | POA: Diagnosis not present

## 2023-06-03 DIAGNOSIS — N183 Chronic kidney disease, stage 3 unspecified: Secondary | ICD-10-CM | POA: Diagnosis not present

## 2023-06-03 DIAGNOSIS — E1122 Type 2 diabetes mellitus with diabetic chronic kidney disease: Secondary | ICD-10-CM | POA: Diagnosis not present

## 2023-06-03 DIAGNOSIS — D631 Anemia in chronic kidney disease: Secondary | ICD-10-CM | POA: Diagnosis not present

## 2023-06-03 DIAGNOSIS — D472 Monoclonal gammopathy: Secondary | ICD-10-CM | POA: Diagnosis not present

## 2023-06-03 DIAGNOSIS — N2581 Secondary hyperparathyroidism of renal origin: Secondary | ICD-10-CM | POA: Diagnosis not present

## 2023-06-04 LAB — LAB REPORT - SCANNED
Albumin, Urine POC: 135.8
Creatinine, POC: 266.7 mg/dL
EGFR: 38
Microalb Creat Ratio: 51

## 2023-06-04 MED FILL — Iron Sucrose Inj 20 MG/ML (Fe Equiv): INTRAVENOUS | Qty: 15 | Status: AC

## 2023-06-05 ENCOUNTER — Inpatient Hospital Stay: Payer: Medicare HMO | Attending: Hematology

## 2023-06-05 ENCOUNTER — Other Ambulatory Visit: Payer: Self-pay

## 2023-06-05 VITALS — BP 121/60 | HR 59 | Temp 97.8°F | Resp 18

## 2023-06-05 DIAGNOSIS — Z79899 Other long term (current) drug therapy: Secondary | ICD-10-CM | POA: Insufficient documentation

## 2023-06-05 DIAGNOSIS — D472 Monoclonal gammopathy: Secondary | ICD-10-CM | POA: Diagnosis not present

## 2023-06-05 DIAGNOSIS — D649 Anemia, unspecified: Secondary | ICD-10-CM | POA: Insufficient documentation

## 2023-06-05 DIAGNOSIS — D508 Other iron deficiency anemias: Secondary | ICD-10-CM

## 2023-06-05 MED ORDER — SODIUM CHLORIDE 0.9 % IV SOLN
300.0000 mg | Freq: Once | INTRAVENOUS | Status: AC
  Administered 2023-06-05: 300 mg via INTRAVENOUS
  Filled 2023-06-05: qty 300

## 2023-06-05 MED ORDER — LORATADINE 10 MG PO TABS
10.0000 mg | ORAL_TABLET | Freq: Once | ORAL | Status: AC
  Administered 2023-06-05: 10 mg via ORAL
  Filled 2023-06-05: qty 1

## 2023-06-05 MED ORDER — ACETAMINOPHEN 325 MG PO TABS
650.0000 mg | ORAL_TABLET | Freq: Once | ORAL | Status: AC
  Administered 2023-06-05: 650 mg via ORAL
  Filled 2023-06-05: qty 2

## 2023-06-05 MED ORDER — SODIUM CHLORIDE 0.9 % IV SOLN: Freq: Once | INTRAVENOUS | Status: AC

## 2023-06-05 NOTE — Patient Instructions (Signed)
Iron Sucrose Injection What is this medication? IRON SUCROSE (EYE ern SOO krose) treats low levels of iron (iron deficiency anemia) in people with kidney disease. Iron is a mineral that plays an important role in making red blood cells, which carry oxygen from your lungs to the rest of your body. This medicine may be used for other purposes; ask your health care provider or pharmacist if you have questions. COMMON BRAND NAME(S): Venofer What should I tell my care team before I take this medication? They need to know if you have any of these conditions: Anemia not caused by low iron levels Heart disease High levels of iron in the blood Kidney disease Liver disease An unusual or allergic reaction to iron, other medications, foods, dyes, or preservatives Pregnant or trying to get pregnant Breastfeeding How should I use this medication? This medication is for infusion into a vein. It is given in a hospital or clinic setting. Talk to your care team about the use of this medication in children. While this medication may be prescribed for children as young as 2 years for selected conditions, precautions do apply. Overdosage: If you think you have taken too much of this medicine contact a poison control center or emergency room at once. NOTE: This medicine is only for you. Do not share this medicine with others. What if I miss a dose? Keep appointments for follow-up doses. It is important not to miss your dose. Call your care team if you are unable to keep an appointment. What may interact with this medication? Do not take this medication with any of the following: Deferoxamine Dimercaprol Other iron products This medication may also interact with the following: Chloramphenicol Deferasirox This list may not describe all possible interactions. Give your health care provider a list of all the medicines, herbs, non-prescription drugs, or dietary supplements you use. Also tell them if you smoke,  drink alcohol, or use illegal drugs. Some items may interact with your medicine. What should I watch for while using this medication? Visit your care team regularly. Tell your care team if your symptoms do not start to get better or if they get worse. You may need blood work done while you are taking this medication. You may need to follow a special diet. Talk to your care team. Foods that contain iron include: whole grains/cereals, dried fruits, beans, or peas, leafy green vegetables, and organ meats (liver, kidney). What side effects may I notice from receiving this medication? Side effects that you should report to your care team as soon as possible: Allergic reactions--skin rash, itching, hives, swelling of the face, lips, tongue, or throat Low blood pressure--dizziness, feeling faint or lightheaded, blurry vision Shortness of breath Side effects that usually do not require medical attention (report to your care team if they continue or are bothersome): Flushing Headache Joint pain Muscle pain Nausea Pain, redness, or irritation at injection site This list may not describe all possible side effects. Call your doctor for medical advice about side effects. You may report side effects to FDA at 1-800-FDA-1088. Where should I keep my medication? This medication is given in a hospital or clinic and will not be stored at home. NOTE: This sheet is a summary. It may not cover all possible information. If you have questions about this medicine, talk to your doctor, pharmacist, or health care provider.  2023 Elsevier/Gold Standard (2020-12-26 00:00:00)  

## 2023-06-10 ENCOUNTER — Ambulatory Visit (HOSPITAL_COMMUNITY)
Admission: RE | Admit: 2023-06-10 | Discharge: 2023-06-10 | Disposition: A | Discharge status: Home / Self Care | Payer: Medicare HMO | Source: Ambulatory Visit | Attending: Rheumatology | Admitting: Rheumatology

## 2023-06-10 DIAGNOSIS — M810 Age-related osteoporosis without current pathological fracture: Secondary | ICD-10-CM | POA: Diagnosis not present

## 2023-06-10 DIAGNOSIS — Z79899 Other long term (current) drug therapy: Secondary | ICD-10-CM | POA: Diagnosis not present

## 2023-06-10 MED ORDER — DENOSUMAB 60 MG/ML ~~LOC~~ SOSY: 60.0000 mg | PREFILLED_SYRINGE | Freq: Once | SUBCUTANEOUS | Status: AC

## 2023-06-10 MED ORDER — DENOSUMAB 60 MG/ML ~~LOC~~ SOSY
PREFILLED_SYRINGE | SUBCUTANEOUS | Status: AC
  Administered 2023-06-10: 60 mg via SUBCUTANEOUS
  Filled 2023-06-10: qty 1

## 2023-06-11 MED FILL — Iron Sucrose Inj 20 MG/ML (Fe Equiv): INTRAVENOUS | Qty: 15 | Status: AC

## 2023-06-12 ENCOUNTER — Inpatient Hospital Stay: Payer: Medicare HMO

## 2023-06-12 VITALS — BP 159/79 | HR 60 | Temp 97.7°F | Resp 16

## 2023-06-12 DIAGNOSIS — D508 Other iron deficiency anemias: Secondary | ICD-10-CM

## 2023-06-12 DIAGNOSIS — Z79899 Other long term (current) drug therapy: Secondary | ICD-10-CM | POA: Diagnosis not present

## 2023-06-12 DIAGNOSIS — D649 Anemia, unspecified: Secondary | ICD-10-CM | POA: Diagnosis not present

## 2023-06-12 DIAGNOSIS — D472 Monoclonal gammopathy: Secondary | ICD-10-CM | POA: Diagnosis not present

## 2023-06-12 MED ORDER — ACETAMINOPHEN 325 MG PO TABS
650.0000 mg | ORAL_TABLET | Freq: Once | ORAL | Status: AC
  Administered 2023-06-12: 650 mg via ORAL
  Filled 2023-06-12: qty 2

## 2023-06-12 MED ORDER — LORATADINE 10 MG PO TABS
10.0000 mg | ORAL_TABLET | Freq: Once | ORAL | Status: AC
  Administered 2023-06-12: 10 mg via ORAL
  Filled 2023-06-12: qty 1

## 2023-06-12 MED ORDER — SODIUM CHLORIDE 0.9 % IV SOLN: Freq: Once | INTRAVENOUS | Status: AC

## 2023-06-12 MED ORDER — SODIUM CHLORIDE 0.9 % IV SOLN
300.0000 mg | Freq: Once | INTRAVENOUS | Status: AC
  Administered 2023-06-12: 300 mg via INTRAVENOUS
  Filled 2023-06-12: qty 300

## 2023-06-12 NOTE — Patient Instructions (Signed)
Iron Sucrose Injection What is this medication? IRON SUCROSE (EYE ern SOO krose) treats low levels of iron (iron deficiency anemia) in people with kidney disease. Iron is a mineral that plays an important role in making red blood cells, which carry oxygen from your lungs to the rest of your body. This medicine may be used for other purposes; ask your health care provider or pharmacist if you have questions. COMMON BRAND NAME(S): Venofer What should I tell my care team before I take this medication? They need to know if you have any of these conditions: Anemia not caused by low iron levels Heart disease High levels of iron in the blood Kidney disease Liver disease An unusual or allergic reaction to iron, other medications, foods, dyes, or preservatives Pregnant or trying to get pregnant Breastfeeding How should I use this medication? This medication is for infusion into a vein. It is given in a hospital or clinic setting. Talk to your care team about the use of this medication in children. While this medication may be prescribed for children as young as 2 years for selected conditions, precautions do apply. Overdosage: If you think you have taken too much of this medicine contact a poison control center or emergency room at once. NOTE: This medicine is only for you. Do not share this medicine with others. What if I miss a dose? Keep appointments for follow-up doses. It is important not to miss your dose. Call your care team if you are unable to keep an appointment. What may interact with this medication? Do not take this medication with any of the following: Deferoxamine Dimercaprol Other iron products This medication may also interact with the following: Chloramphenicol Deferasirox This list may not describe all possible interactions. Give your health care provider a list of all the medicines, herbs, non-prescription drugs, or dietary supplements you use. Also tell them if you smoke,  drink alcohol, or use illegal drugs. Some items may interact with your medicine. What should I watch for while using this medication? Visit your care team regularly. Tell your care team if your symptoms do not start to get better or if they get worse. You may need blood work done while you are taking this medication. You may need to follow a special diet. Talk to your care team. Foods that contain iron include: whole grains/cereals, dried fruits, beans, or peas, leafy green vegetables, and organ meats (liver, kidney). What side effects may I notice from receiving this medication? Side effects that you should report to your care team as soon as possible: Allergic reactions--skin rash, itching, hives, swelling of the face, lips, tongue, or throat Low blood pressure--dizziness, feeling faint or lightheaded, blurry vision Shortness of breath Side effects that usually do not require medical attention (report to your care team if they continue or are bothersome): Flushing Headache Joint pain Muscle pain Nausea Pain, redness, or irritation at injection site This list may not describe all possible side effects. Call your doctor for medical advice about side effects. You may report side effects to FDA at 1-800-FDA-1088. Where should I keep my medication? This medication is given in a hospital or clinic and will not be stored at home. NOTE: This sheet is a summary. It may not cover all possible information. If you have questions about this medicine, talk to your doctor, pharmacist, or health care provider.  2023 Elsevier/Gold Standard (2020-12-26 00:00:00)

## 2023-06-17 ENCOUNTER — Other Ambulatory Visit (HOSPITAL_COMMUNITY): Payer: Self-pay

## 2023-06-21 ENCOUNTER — Encounter: Payer: Self-pay | Admitting: Cardiovascular Disease

## 2023-06-21 NOTE — Progress Notes (Signed)
Cardiology Office Note:    Date:  06/28/2023   ID:  [name], [name] 14-Jan-1946, MRN [id number]  PCP:  Dorothyann Peng, MD  [facility] Cardiologist:  New to [facility]  [facility] Electrophysiologist:  None   Referring MD: Dorothyann Peng, MD   Chief Complaint  Patient presents with   Hypertension   Atrial Fibrillation         History of Present Illness:    Hannah [name] is a 77 y.o. female with a hx of palpitations.   We are asked to see her by Dr. Allyne Gee for further evaluation of these palpitations.   Hx of hyperlipidemia, hy[pothyroidism, HTN    These palpitations occur randomly.  Typically at night when she is trying to go to sleep Occurs rarely.  started 4 months ago.   Last episode  Was a month ago  Typically at night when she is in bed.   No dizziness,  No sweats.  Single isolated HR irreg.  Exercises 4 times a week.   Not limited by palpitations  ECGs have been normal  TSH is normal and has been stable   Has had both covid vaccines Sees hematology for mild anemia    April 16, 2021: Hannah [name] is seen today for follow up of her palpitations.  She was found to have PAF during a COVID infection   ( See Media - March 26, 2021, Photos)  She was started on Eliquis 5 m gBID and  toprol XL 25 mg a day   CHADS2VASC is 12 ( age 2, female, HTN, DM)  She needs to have a knee replacement at some point She is at low risk for her upcoming knee surgery   Jan. 16, 2023: Hannah [name] is seen today for follow up of her PAF ( occurred during a COVID infection) She is on eliquis CHADS2VASC is 5   BP has been ok at home  No palpitations to suggest   Dec. 21, 2023  Hannah [name] is seen for HTN, palpitations, PAF  Had a left knee replacement in May  Doing well Is starting back with her exercise ,  was in physical therapy until 2 weeks No CP , no dyspnea, no palpitations to suggest PAF  She has a soft systolic murmur on exam. Echocardiogram from March 27, 2021  reveals normal left ventricular systolic function with EF of 55 to 60%.  She has grade 1 diastolic dysfunction.  Right ventricle size and function are normal.  There is mild calcification of the aortic valve.  There is no evidence of aortic stenosis.  There is trivial mitral regurgitation.  Trivial tricuspid valve regurgitation.   She is planning on possibly having her right knee replacement this year.  She is doing well.  She is at low risk from a cardiac standpoint to get her knee replaced.  She will be able to hold her Eliquis for 2 to 3 days prior to her surgery.   Sept. 30, 2024 Hannah [name] is seen for follow up of her PAF We discussed her possible knee surgery at her last visit in Dec. , 2023 She is now scheduled for her TKA.  She is done well from a cardiac standpoint.  No issues.  She is on Eliquis.  She may hold her Eliquis for 2 to 3 days prior to her surgery.  She will likely need 3-day hold for spinal anesthesia.  She should restart the Eliquis as soon it is as it is safe from a surgical standpoint.  No Cp , no dyspnea   BP is well controlled.         Past Medical History:  Diagnosis Date   Anemia    Arthritis    Asthma    Atrial fibrillation (HCC)    Chronic kidney disease    Diabetes mellitus without complication (HCC)    Gout 12/17/2014   patient reported   Hyperlipidemia    Hypertension    Hypothyroidism    Systemic lupus erythematosus (HCC)    Vitiligo     Past Surgical History:  Procedure Laterality Date   CATARACT EXTRACTION Bilateral 2015   DILATION AND CURETTAGE OF UTERUS     DOPPLER ECHOCARDIOGRAPHY  05/2018   Internist to review with pt; potential heart murmur 06/20/18   keratosis removal  2021   KNEE CLOSED REDUCTION Left 07/06/2022   Procedure: CLOSED MANIPULATION KNEE;  Surgeon: Ollen Gross, MD;  Location: [facility];  Service: Orthopedics;  Laterality: Left;   REPLACEMENT TOTAL KNEE Left    SKIN SURGERY  11/30/2018   left side of face   TOOTH  EXTRACTION     TOTAL KNEE ARTHROPLASTY Left 02/02/2022   Procedure: TOTAL KNEE ARTHROPLASTY;  Surgeon: Ollen Gross, MD;  Location: [facility];  Service: Orthopedics;  Laterality: Left;    Current Medications: Current Meds  Medication Sig   Accu-Chek Softclix Lancets lancets Use to check blood sugars daily E11.69   albuterol (PROVENTIL HFA;VENTOLIN HFA) 108 (90 Base) MCG/ACT inhaler Inhale 2 puffs into the lungs every 6 (six) hours as needed for wheezing or shortness of breath.   Alcohol Swabs (ALCOHOL PADS) 70 % PADS Use as directed to check blood sugars 1 time per day dx: e11.22   allopurinol (ZYLOPRIM) 100 MG tablet Take 1 tablet (100 mg total) by mouth 3 (three) times a week for 90 doses. (Patient taking differently: Take 100 mg by mouth every Monday, Wednesday, and Friday.)   amLODipine (NORVASC) 5 MG tablet TAKE 1 TABLET(5 MG) BY MOUTH DAILY (Patient taking differently: Take 5 mg by mouth at bedtime.)   apixaban (ELIQUIS) 5 MG TABS tablet Take 1 tablet (5 mg total) by mouth 2 (two) times daily.   Ascorbic Acid (VITAMIN C PO) Take 500 mg by mouth daily with lunch.   atorvastatin (LIPITOR) 80 MG tablet TAKE 1 TABLET BY MOUTH EVERY DAY (Patient taking differently: Take 80 mg by mouth at bedtime.)   B Complex-C (B-COMPLEX WITH VITAMIN C) tablet Take 1 tablet by mouth daily with lunch.   budesonide-formoterol (SYMBICORT) 160-4.5 MCG/ACT inhaler INHALE 2 PUFFS BY MOUTH TWICE DAILY IN THE MORNING AND IN THE EVENING (Patient taking differently: 2 puffs 2 (two) times daily as needed (respiratory issues.).)   Calcium Carb-Cholecalciferol 600-800 MG-UNIT TABS Take 1 tablet by mouth daily with lunch.   Cholecalciferol (VITAMIN D PO) Take 2,000 Units by mouth daily with lunch.   denosumab (PROLIA) 60 MG/ML SOSY injection Inject 60 mg into the skin every 6 (six) months. Courier to rheum: [address], [address], [place] [address]. Appt on 12/07/2022   glucose blood (ACCU-CHEK GUIDE) test strip USE TO  CHECK BLOOD SUGAR DAILY   hydroxychloroquine (PLAQUENIL) 200 MG tablet TAKE 1 TABLET( 200 MG) BY MOUTH IN THE MORNING AND 1/2 TABLET (100 MG) IN THE EVENING MONDAY THROUGH FRIDAY ONLY.   irbesartan (AVAPRO) 300 MG tablet TAKE 1 TABLET(300 MG) BY MOUTH DAILY   Iron Sucrose (VENOFER IV) Inject 1 Dose into the vein as needed (per patient labs).  JARDIANCE 10 MG TABS tablet Take 10 mg by mouth in the morning.   metoprolol succinate (TOPROL-XL) 25 MG 24 hr tablet TAKE 1 TABLET(25 MG) BY MOUTH DAILY   Multiple Vitamin (MULTIVITAMIN WITH MINERALS) TABS tablet Take 1 tablet by mouth daily with lunch. Women's Multivitamin   Polyethyl Glycol-Propyl Glycol (SYSTANE) 0.4-0.3 % SOLN Place 1-2 drops into both eyes 3 (three) times daily as needed (dry/irritated eyes.).   SYNTHROID 88 MCG tablet TAKE 1 TABLET BY MOUTH EVERY DAY MONDAY TO SATURDAY AND OFF ON SUNDAYS     Allergies:   Shellfish allergy   Social History   Socioeconomic History   Marital status: Single    Spouse name: Not on file   Number of children: 0   Years of education: Not on file   Highest education level: Not on file  Occupational History   Occupation: retired  Tobacco Use   Smoking status: Never    Passive exposure: Never   Smokeless tobacco: Never  Vaping Use   Vaping status: Never Used  Substance and Sexual Activity   Alcohol use: Not Currently   Drug use: No   Sexual activity: Not Currently    Birth control/protection: Post-menopausal    Comment: 1ST intercourse- 19 , partners- 6,   Other Topics Concern   Not on file  Social History Narrative   Not on file   Social Determinants of Health   Financial Resource Strain: Low Risk  (06/27/2022)   Overall Financial Resource Strain (CARDIA)    Difficulty of Paying Living Expenses: Not hard at all  Food Insecurity: No Food Insecurity (06/27/2022)   Hunger Vital Sign    Worried About Running Out of Food in the Last Year: Never true    Ran Out of Food in the Last Year:  Never true  Transportation Needs: No Transportation Needs (06/27/2022)   PRAPARE - Administrator, Civil Service (Medical): No    Lack of Transportation (Non-Medical): No  Physical Activity: Sufficiently Active (06/27/2022)   Exercise Vital Sign    Days of Exercise per Week: 3 days    Minutes of Exercise per Session: 60 min  Stress: No Stress Concern Present (06/27/2022)   [employer] of Occupational Health - Occupational Stress Questionnaire    Feeling of Stress : Not at all  Social Connections: Moderately Integrated (06/27/2022)   Social Connection and Isolation Panel [NHANES]    Frequency of Communication with Friends and Family: More than three times a week    Frequency of Social Gatherings with Friends and Family: More than three times a week    Attends Religious Services: More than 4 times per year    Active Member of [employer] or Organizations: Yes    Attends Engineer, structural: More than 4 times per year    Marital Status: Never married     Family History: The patient's family history includes Diabetes in her father; Heart disease in her father; Hypertension in her mother, sister, and sister; Leukemia in her sister.  ROS:   Please see the history of present illness.     All other systems reviewed and are negative.  EKGs/Labs/Other Studies Reviewed:    The following studies were reviewed today:   EKG:    EKG Interpretation Date/Time:  Monday June 28 2023 07:59:59 EDT Ventricular Rate:  70 PR Interval:  210 QRS Duration:  94 QT Interval:  414 QTC Calculation: 447 R Axis:   -32  Text Interpretation: Sinus rhythm with  1st degree A-V block with Premature atrial complexes Left axis deviation When compared with ECG of 26-Mar-2021 13:47, No significant change since last tracing Confirmed by Kristeen Miss [phone]) on 06/28/2023 8:18:39 AM     Recent Labs: 05/26/2023: ALT 16; BUN 23; Creat 1.57; Hemoglobin 10.1; Platelets 181; Potassium 4.4; Sodium  141  Recent Lipid Panel    Component Value Date/Time   CHOL 131 01/12/2023 1154   TRIG 63 01/12/2023 1154   HDL 66 01/12/2023 1154   CHOLHDL 2.0 01/12/2023 1154   LDLCALC 52 01/12/2023 1154    Physical Exam:    Physical Exam: Blood pressure 136/70, pulse 77, height 4\' 11"  (1.499 m), weight 170 lb (77.1 kg), SpO2 96%.       GEN:  Well nourished, well developed in no acute distress HEENT: Normal NECK: No JVD; No carotid bruits LYMPHATICS: No lymphadenopathy CARDIAC: RRR ,  soft systolic murmur RESPIRATORY:  Clear to auscultation without rales, wheezing or rhonchi  ABDOMEN: Soft, non-tender, non-distended MUSCULOSKELETAL:  No edema; No deformity  SKIN: Warm and dry NEUROLOGIC:  Alert and oriented x 3    ASSESSMENT:    1. Paroxysmal atrial fibrillation (HCC)      PLAN:     PAF : She was found to have an episode of paroxysmal atrial fibrillation when she had a COVID infection.   CHADS2VASC is 42 ( age 72, female, HTN, DM)  She is at low risk for her upcoming knee surgery.  She may hold her Eliquis for 2 to 3 days prior to her surgery.  She should restart Eliquis as soon as it is safe from a surgical standpoint.   HTN:     Blood pressure is well-controlled.   Medication Adjustments/Labs and Tests Ordered: Current medicines are reviewed at length with the patient today.  Concerns regarding medicines are outlined above.  Orders Placed This Encounter  Procedures   EKG 12-Lead    No orders of the defined types were placed in this encounter.     Patient Instructions  Medication Instructions:   Your physician recommends that you continue on your current medications as directed. Please refer to the Current Medication list given to you today.  *If you need a refill on your cardiac medications before your next appointment, please call your pharmacy*    Follow-Up: At [facility], you and your health needs are our priority.  As part of our continuing  mission to provide you with exceptional heart care, we have created designated Provider Care Teams.  These Care Teams include your primary Cardiologist (physician) and Advanced Practice Providers (APPs -  Physician Assistants and Nurse Practitioners) who all work together to provide you with the care you need, when you need it.  We recommend signing up for the patient portal called "MyChart".  Sign up information is provided on this After Visit Summary.  MyChart is used to connect with patients for Virtual Visits (Telemedicine).  Patients are able to view lab/test results, encounter notes, upcoming appointments, etc.  Non-urgent messages can be sent to your provider as well.   To learn more about what you can do with MyChart, go to [email or web address].    Your next appointment:   1 year(s)  Provider:   Jodelle Red, MD        Signed, Kristeen Miss, MD  06/28/2023 8:27 AM    [facility]

## 2023-06-22 ENCOUNTER — Telehealth (INDEPENDENT_AMBULATORY_CARE_PROVIDER_SITE_OTHER): Payer: Self-pay | Admitting: Internal Medicine

## 2023-06-22 ENCOUNTER — Encounter: Payer: Self-pay | Admitting: Internal Medicine

## 2023-06-22 VITALS — BP 139/72 | HR 70 | Temp 98.0°F

## 2023-06-22 DIAGNOSIS — U071 COVID-19: Secondary | ICD-10-CM | POA: Diagnosis not present

## 2023-06-22 MED ORDER — LAGEVRIO 200 MG PO CAPS: 4.0000 | ORAL_CAPSULE | Freq: Two times a day (BID) | ORAL | 0 refills | Status: AC

## 2023-06-22 NOTE — Progress Notes (Signed)
Virtual Visit via Video   This visit type was conducted due to national recommendations for restrictions regarding the COVID-19 Pandemic (e.g. social distancing) in an effort to limit this patient's exposure and mitigate transmission in our community.  Due to her co-morbid illnesses, this patient is at least at moderate risk for complications without adequate follow up.  This format is felt to be most appropriate for this patient at this time.  All issues noted in this document were discussed and addressed.  A limited physical exam was performed with this format.    This visit type was conducted due to national recommendations for restrictions regarding the COVID-19 Pandemic (e.g. social distancing) in an effort to limit this patient's exposure and mitigate transmission in our community.  Patients identity confirmed using two different identifiers.  This format is felt to be most appropriate for this patient at this time.  All issues noted in this document were discussed and addressed.  No physical exam was performed (except for noted visual exam findings with Video Visits).    Date:  06/26/2023   ID:  Hannah [name] 06/24/1946, MRN [id number]  Patient Location:  Home  Provider location:   Office    Chief Complaint:  "I have COVID"  History of Present Illness:    Hannah [name] is a 77 y.o. female who presents via video conferencing for a telehealth visit today.    The patient does have symptoms concerning for COVID-19 infection (fever, chills, cough, or new shortness of breath).   She presents today for virtual visit.  She states she tested positive for Covid this morning. She reports she has a cough and she had a low grade fever yesterday. She also reports having a runny nose. She states she started to have symptoms yesterday. She returned from a trip Saturday about 7pm, she returned from [place] trip with a group of 6 friends. However the entire group included 48  people. She does want rx for Paxlovid.       Past Medical History:  Diagnosis Date   Anemia    Arthritis    Asthma    Atrial fibrillation (HCC)    Chronic kidney disease    Diabetes mellitus without complication (HCC)    Gout 12/17/2014   patient reported   Hyperlipidemia    Hypertension    Hypothyroidism    Systemic lupus erythematosus (HCC)    Vitiligo    Past Surgical History:  Procedure Laterality Date   CATARACT EXTRACTION Bilateral 2015   DILATION AND CURETTAGE OF UTERUS     DOPPLER ECHOCARDIOGRAPHY  05/2018   Internist to review with pt; potential heart murmur 06/20/18   keratosis removal  2021   KNEE CLOSED REDUCTION Left 07/06/2022   Procedure: CLOSED MANIPULATION KNEE;  Surgeon: Ollen Gross, MD;  Location: [facility];  Service: Orthopedics;  Laterality: Left;   REPLACEMENT TOTAL KNEE Left    SKIN SURGERY  11/30/2018   left side of face   TOOTH EXTRACTION     TOTAL KNEE ARTHROPLASTY Left 02/02/2022   Procedure: TOTAL KNEE ARTHROPLASTY;  Surgeon: Ollen Gross, MD;  Location: [facility];  Service: Orthopedics;  Laterality: Left;     Current Meds  Medication Sig   Accu-Chek Softclix Lancets lancets Use to check blood sugars daily E11.69   albuterol (PROVENTIL HFA;VENTOLIN HFA) 108 (90 Base) MCG/ACT inhaler Inhale 2 puffs into the lungs every 6 (six) hours as needed for wheezing or shortness of breath.   Alcohol  Swabs (ALCOHOL PADS) 70 % PADS Use as directed to check blood sugars 1 time per day dx: e11.22   allopurinol (ZYLOPRIM) 100 MG tablet Take 1 tablet (100 mg total) by mouth 3 (three) times a week for 90 doses. (Patient taking differently: Take 100 mg by mouth every Monday, Wednesday, and Friday.)   amLODipine (NORVASC) 5 MG tablet TAKE 1 TABLET(5 MG) BY MOUTH DAILY (Patient taking differently: Take 5 mg by mouth at bedtime.)   apixaban (ELIQUIS) 5 MG TABS tablet Take 1 tablet (5 mg total) by mouth 2 (two) times daily.   Ascorbic Acid (VITAMIN C PO) Take 500 mg by  mouth daily with lunch.   atorvastatin (LIPITOR) 80 MG tablet TAKE 1 TABLET BY MOUTH EVERY DAY (Patient taking differently: Take 80 mg by mouth at bedtime.)   budesonide-formoterol (SYMBICORT) 160-4.5 MCG/ACT inhaler INHALE 2 PUFFS BY MOUTH TWICE DAILY IN THE MORNING AND IN THE EVENING (Patient taking differently: 2 puffs 2 (two) times daily as needed (respiratory issues.).)   Calcium Carb-Cholecalciferol 600-800 MG-UNIT TABS Take 1 tablet by mouth daily with lunch.   Cholecalciferol (VITAMIN D PO) Take 2,000 Units by mouth daily with lunch.   denosumab (PROLIA) 60 MG/ML SOSY injection Inject 60 mg into the skin every 6 (six) months. Courier to rheum: [address], [place] [address]. Appt on 12/07/2022   glucose blood (ACCU-CHEK GUIDE) test strip USE TO CHECK BLOOD SUGAR DAILY   hydroxychloroquine (PLAQUENIL) 200 MG tablet TAKE 1 TABLET( 200 MG) BY MOUTH IN THE MORNING AND 1/2 TABLET (100 MG) IN THE EVENING MONDAY THROUGH FRIDAY ONLY.   irbesartan (AVAPRO) 300 MG tablet TAKE 1 TABLET(300 MG) BY MOUTH DAILY   JARDIANCE 10 MG TABS tablet Take 10 mg by mouth in the morning.   metoprolol succinate (TOPROL-XL) 25 MG 24 hr tablet TAKE 1 TABLET(25 MG) BY MOUTH DAILY   molnupiravir EUA (LAGEVRIO) 200 MG CAPS capsule Take 4 capsules (800 mg total) by mouth 2 (two) times daily for 5 days.   SYNTHROID 88 MCG tablet TAKE 1 TABLET BY MOUTH EVERY DAY MONDAY TO SATURDAY AND OFF ON SUNDAYS   [DISCONTINUED] cyanocobalamin 1000 MCG tablet Take 1,000 mcg by mouth daily.   [DISCONTINUED] Hypromellose (ARTIFICIAL TEARS OP) Apply 1 drop to eye daily as needed (dry eyes).   [DISCONTINUED] UNABLE TO FIND Med Name: iron infusions     Allergies:   Shellfish allergy   Social History   Tobacco Use   Smoking status: Never    Passive exposure: Never   Smokeless tobacco: Never  Vaping Use   Vaping status: Never Used  Substance Use Topics   Alcohol use: Not Currently   Drug use: No     Family  Hx: The patient's family history includes Diabetes in her father; Heart disease in her father; Hypertension in her mother, sister, and sister; Leukemia in her sister.  ROS:   Please see the history of present illness.    Review of Systems  Constitutional:  Positive for fever.  HENT:  Positive for sore throat.        POS RHINORRHEA  Eyes: Negative.   Respiratory:  Positive for cough.   Cardiovascular: Negative.     All other systems reviewed and are negative.   Labs/Other Tests and Data Reviewed:    Recent Labs: 05/26/2023: ALT 16; BUN 23; Creat 1.57; Hemoglobin 10.1; Platelets 181; Potassium 4.4; Sodium 141   Recent Lipid Panel Lab Results  Component Value Date/Time  CHOL 131 01/12/2023 11:54 AM   TRIG 63 01/12/2023 11:54 AM   HDL 66 01/12/2023 11:54 AM   CHOLHDL 2.0 01/12/2023 11:54 AM   LDLCALC 52 01/12/2023 11:54 AM    Wt Readings from Last 3 Encounters:  05/17/23 169 lb 11.2 oz (77 kg)  03/30/23 174 lb (78.9 kg)  03/16/23 174 lb (78.9 kg)     Exam:    Vital Signs:  BP 139/72   Pulse 70   Temp 98 F (36.7 C)     Physical Exam Vitals and nursing note reviewed.  Constitutional:      Appearance: Normal appearance.  HENT:     Head: Normocephalic and atraumatic.  Eyes:     Extraocular Movements: Extraocular movements intact.  Pulmonary:     Effort: Pulmonary effort is normal.  Musculoskeletal:     Cervical back: Normal range of motion.  Neurological:     Mental Status: She is alert and oriented to person, place, and time.  Psychiatric:        Mood and Affect: Affect normal.     ASSESSMENT & PLAN:   COVID-19 virus infection Assessment & Plan:  She would like treatment, we initially discussed the use of Paxlovid.  However, due to her being on Eliquis, we decided to move forward with Lagevrio. No medication interactions with this medication. She was advised to take full course, possible side effects d/w patient. I will also refer her for home  monitoring/temperature monitoring program. She is encouraged to email me daily on Mychart to let me know how she is doing. She is encouraged to go to ER should she develop worsening SOB. Pt advised to quarantine for five days and mask for 10 days. She is also advised to stay well hydrated, move periodically throughout the day and to have a hot beverage daily.  She verbalizes understanding of her treatment plan. All questions were answered to her satisfaction. She understands that she needs to continue to self quarantine    Orders: -     MyChart Temperature FLOWSHEET; Future  Other orders -     Lagevrio; Take 4 capsules (800 mg total) by mouth 2 (two) times daily for 5 days.  Dispense: 40 capsule; Refill: 0 -     MYCHART COVID-19 HOME MONITORING PROGRAM; Future     COVID-19 Education: The signs and symptoms of COVID-19 were discussed with the patient and how to seek care for testing (follow up with PCP or arrange E-visit).  The importance of social distancing was discussed today.  Patient Risk:   After full review of this patients clinical status, I feel that they are at least moderate risk at this time.  Time:   Today, I have spent 15 minutes/ seconds with the patient with telehealth technology discussing above diagnoses.  THIS IS A FAILED VIDEO VISIT. SHE WAS ABLE TO CONNECT VIA CAMERA, HOWEVER, SHE COULD NOT CONNECT HER MICROPHONE.    Medication Adjustments/Labs and Tests Ordered: Current medicines are reviewed at length with the patient today.  Concerns regarding medicines are outlined above.   Tests Ordered: No orders of the defined types were placed in this encounter.   Medication Changes: Meds ordered this encounter  Medications   molnupiravir EUA (LAGEVRIO) 200 MG CAPS capsule    Sig: Take 4 capsules (800 mg total) by mouth 2 (two) times daily for 5 days.    Dispense:  40 capsule    Refill:  0    Disposition:  Follow up prn  Signed, [name], MD

## 2023-06-26 NOTE — Assessment & Plan Note (Signed)
She would like treatment, we initially discussed the use of Paxlovid.  However, due to her being on Eliquis, we decided to move forward with Lagevrio. No medication interactions with this medication. She was advised to take full course, possible side effects d/w patient. I will also refer her for home monitoring/temperature monitoring program. She is encouraged to email me daily on Mychart to let me know how she is doing. She is encouraged to go to ER should she develop worsening SOB. Pt advised to quarantine for five days and mask for 10 days. She is also advised to stay well hydrated, move periodically throughout the day and to have a hot beverage daily.  She verbalizes understanding of her treatment plan. All questions were answered to her satisfaction. She understands that she needs to continue to self quarantine

## 2023-06-28 ENCOUNTER — Encounter: Payer: Self-pay | Admitting: Cardiovascular Disease

## 2023-06-28 ENCOUNTER — Ambulatory Visit: Payer: Medicare HMO | Attending: Cardiovascular Disease | Admitting: Cardiovascular Disease

## 2023-06-28 VITALS — BP 136/70 | HR 77 | Ht 59.0 in | Wt 170.0 lb

## 2023-06-28 DIAGNOSIS — I480 Paroxysmal atrial fibrillation: Secondary | ICD-10-CM | POA: Diagnosis not present

## 2023-06-28 NOTE — Patient Instructions (Signed)
Medication Instructions:  Your physician recommends that you continue on your current medications as directed. Please refer to the Current Medication list given to you today.  *If you need a refill on your cardiac medications before your next appointment, please call your pharmacy*  Follow-Up: At [facility], you and your health needs are our priority.  As part of our continuing mission to provide you with exceptional heart care, we have created designated Provider Care Teams.  These Care Teams include your primary Cardiologist (physician) and Advanced Practice Providers (APPs -  Physician Assistants and Nurse Practitioners) who all work together to provide you with the care you need, when you need it.  We recommend signing up for the patient portal called "MyChart".  Sign up information is provided on this After Visit Summary.  MyChart is used to connect with patients for Virtual Visits (Telemedicine).  Patients are able to view lab/test results, encounter notes, upcoming appointments, etc.  Non-urgent messages can be sent to your provider as well.   To learn more about what you can do with MyChart, go to [email or web address].    Your next appointment:   1 year(s)  Provider:   Bridgette Christopher, MD    

## 2023-06-28 NOTE — Progress Notes (Signed)
SEE NOTES FROM DR. NASHER

## 2023-06-30 ENCOUNTER — Ambulatory Visit: Payer: Medicare HMO

## 2023-06-30 ENCOUNTER — Ambulatory Visit: Payer: Medicare HMO | Admitting: Internal Medicine

## 2023-06-30 DIAGNOSIS — Z0000 Encounter for general adult medical examination without abnormal findings: Secondary | ICD-10-CM | POA: Diagnosis not present

## 2023-06-30 NOTE — Patient Instructions (Signed)
Hannah [name] , Thank you for taking time to come for your Medicare Wellness Visit. I appreciate your ongoing commitment to your health goals. Please review the following plan we discussed and let me know if I can assist you in the future.   Referrals/Orders/Follow-Ups/Clinician Recommendations: none  This is a list of the screening recommended for you and due dates:  Health Maintenance  Topic Date Due   Complete foot exam   01/01/2023   Flu Shot  04/29/2023   COVID-19 Vaccine (7 - 2023-24 season) 05/30/2023   Hemoglobin A1C  07/14/2023   Eye exam for diabetics  12/24/2023   Mammogram  05/11/2024   Yearly kidney function blood test for diabetes  06/03/2024   Yearly kidney health urinalysis for diabetes  06/03/2024   Medicare Annual Wellness Visit  06/29/2024   DEXA scan (bone density measurement)  09/03/2024   DTaP/Tdap/Td vaccine (3 - Td or Tdap) 06/17/2029   Pneumonia Vaccine  Completed   Hepatitis C Screening  Completed   Zoster (Shingles) Vaccine  Completed   HPV Vaccine  Aged Out   Colon Cancer Screening  Discontinued    Advanced directives: (In Chart) A copy of your advanced directives are scanned into your chart should your provider ever need it.  Next Medicare Annual Wellness Visit scheduled for next year: No, office will schedule appointment  Insert Preventive Care attachment Insert FALL PREVENTION attachment if needed

## 2023-06-30 NOTE — Progress Notes (Signed)
Subjective:   Hannah [name] is a 77 y.o. female who presents for Medicare Annual (Subsequent) preventive examination.  Visit Complete: Virtual  I connected with  [name] on 06/30/23 by a audio enabled telemedicine application and verified that I am speaking with the correct person using two identifiers.  Patient Location: Home  Provider Location: Office/Clinic  I discussed the limitations of evaluation and management by telemedicine. The patient expressed understanding and agreed to proceed.  Patient Medicare AWV questionnaire was completed by the patient on 06/29/2023; I have confirmed that all information answered by patient is correct and no changes since this date.  Because this visit was a virtual/telehealth visit, some criteria may be missing or patient reported. Any vitals not documented were not able to be obtained and vitals that have been documented are patient reported.   Cardiac Risk Factors include: advanced age (>6men, >20 women);diabetes mellitus;hypertension     Objective:    Today's Vitals   There is no height or weight on file to calculate BMI.     06/30/2023    4:03 PM 07/18/2022    8:24 AM 06/27/2022    9:49 AM 06/26/2022    9:16 AM 02/02/2022    7:56 AM 01/20/2022    8:02 AM 06/25/2021   11:18 AM  Advanced Directives  Does Patient Have a Medical Advance Directive? Yes Yes Yes Yes Yes Yes Yes  Type of Estate agent of [place];Living will   Healthcare Power of [place];Living will Healthcare Power of [place];Living will Healthcare Power of [place];Living will Healthcare Power of [place];Living will  Does patient want to make changes to medical advance directive?  No - Patient declined   No - Patient declined    Copy of Healthcare Power of Attorney in Chart? Yes - validated most recent copy scanned in chart (See row information)  Yes - validated most recent copy scanned in chart (See row information) Yes - validated most  recent copy scanned in chart (See row information) Yes - validated most recent copy scanned in chart (See row information) No - copy requested No - copy requested    Current Medications (verified) Outpatient Encounter Medications as of 06/30/2023  Medication Sig   Accu-Chek Softclix Lancets lancets Use to check blood sugars daily E11.69   albuterol (PROVENTIL HFA;VENTOLIN HFA) 108 (90 Base) MCG/ACT inhaler Inhale 2 puffs into the lungs every 6 (six) hours as needed for wheezing or shortness of breath.   Alcohol Swabs (ALCOHOL PADS) 70 % PADS Use as directed to check blood sugars 1 time per day dx: e11.22   allopurinol (ZYLOPRIM) 100 MG tablet Take 1 tablet (100 mg total) by mouth 3 (three) times a week for 90 doses. (Patient taking differently: Take 100 mg by mouth every Monday, Wednesday, and Friday.)   amLODipine (NORVASC) 5 MG tablet TAKE 1 TABLET(5 MG) BY MOUTH DAILY (Patient taking differently: Take 5 mg by mouth at bedtime.)   apixaban (ELIQUIS) 5 MG TABS tablet Take 1 tablet (5 mg total) by mouth 2 (two) times daily.   Ascorbic Acid (VITAMIN C PO) Take 500 mg by mouth daily with lunch.   atorvastatin (LIPITOR) 80 MG tablet TAKE 1 TABLET BY MOUTH EVERY DAY (Patient taking differently: Take 80 mg by mouth at bedtime.)   B Complex-C (B-COMPLEX WITH VITAMIN C) tablet Take 1 tablet by mouth daily with lunch.   budesonide-formoterol (SYMBICORT) 160-4.5 MCG/ACT inhaler INHALE 2 PUFFS BY MOUTH TWICE DAILY IN THE MORNING AND IN THE  EVENING (Patient taking differently: 2 puffs 2 (two) times daily as needed (respiratory issues.).)   Calcium Carb-Cholecalciferol 600-800 MG-UNIT TABS Take 1 tablet by mouth daily with lunch.   Cholecalciferol (VITAMIN D PO) Take 2,000 Units by mouth daily with lunch.   denosumab (PROLIA) 60 MG/ML SOSY injection Inject 60 mg into the skin every 6 (six) months. Courier to rheum: [address], [address], [place] [address]. Appt on 12/07/2022   glucose blood (ACCU-CHEK  GUIDE) test strip USE TO CHECK BLOOD SUGAR DAILY   hydroxychloroquine (PLAQUENIL) 200 MG tablet TAKE 1 TABLET( 200 MG) BY MOUTH IN THE MORNING AND 1/2 TABLET (100 MG) IN THE EVENING MONDAY THROUGH FRIDAY ONLY.   irbesartan (AVAPRO) 300 MG tablet TAKE 1 TABLET(300 MG) BY MOUTH DAILY   Iron Sucrose (VENOFER IV) Inject 1 Dose into the vein as needed (per patient labs).   JARDIANCE 10 MG TABS tablet Take 10 mg by mouth in the morning.   metoprolol succinate (TOPROL-XL) 25 MG 24 hr tablet TAKE 1 TABLET(25 MG) BY MOUTH DAILY   Multiple Vitamin (MULTIVITAMIN WITH MINERALS) TABS tablet Take 1 tablet by mouth daily with lunch. Women's Multivitamin   Polyethyl Glycol-Propyl Glycol (SYSTANE) 0.4-0.3 % SOLN Place 1-2 drops into both eyes 3 (three) times daily as needed (dry/irritated eyes.).   SYNTHROID 88 MCG tablet TAKE 1 TABLET BY MOUTH EVERY DAY MONDAY TO SATURDAY AND OFF ON SUNDAYS   No facility-administered encounter medications on file as of 06/30/2023.    Allergies (verified) Shellfish allergy   History: Past Medical History:  Diagnosis Date   Allergy 25 years ago.   Anemia    Arthritis    Asthma    Atrial fibrillation (HCC)    Cataract Had cataracts surgery 2015 & 2016   Chronic kidney disease    Diabetes mellitus without complication (HCC)    Gout 12/17/2014   patient reported   Heart murmur 4 years ago   Hyperlipidemia    Hypertension    Hypothyroidism    Systemic lupus erythematosus (HCC)    Vitiligo    Past Surgical History:  Procedure Laterality Date   CATARACT EXTRACTION Bilateral 2015   DILATION AND CURETTAGE OF UTERUS     DOPPLER ECHOCARDIOGRAPHY  05/2018   Internist to review with pt; potential heart murmur 06/20/18   EYE SURGERY  Cataract Surgery, 2015 & 2016.   keratosis removal  2021   KNEE CLOSED REDUCTION Left 07/06/2022   Procedure: CLOSED MANIPULATION KNEE;  Surgeon: Ollen Gross, MD;  Location: [facility];  Service: Orthopedics;  Laterality: Left;   REPLACEMENT  TOTAL KNEE Left    SKIN SURGERY  11/30/2018   left side of face   TOOTH EXTRACTION     TOTAL KNEE ARTHROPLASTY Left 02/02/2022   Procedure: TOTAL KNEE ARTHROPLASTY;  Surgeon: Ollen Gross, MD;  Location: [facility];  Service: Orthopedics;  Laterality: Left;   Family History  Problem Relation Age of Onset   Heart disease Father    Diabetes Father    Hypertension Mother    Obesity Mother    Varicose Veins Mother    Hypertension Sister    Hypertension Sister    Leukemia Sister    Cancer Sister    Obesity Sister    Social History   Socioeconomic History   Marital status: Single    Spouse name: Not on file   Number of children: 0   Years of education: Not on file   Highest education level: Bachelor's degree (e.g.,  BA, AB, BS)  Occupational History   Occupation: retired  Tobacco Use   Smoking status: Never    Passive exposure: Never   Smokeless tobacco: Never  Vaping Use   Vaping status: Never Used  Substance and Sexual Activity   Alcohol use: Not Currently    Comment: Maybe a glass of wine every 3 months.   Drug use: No   Sexual activity: Not Currently    Birth control/protection: None    Comment: 1ST intercourse- 19 , partners- 6,   Other Topics Concern   Not on file  Social History Narrative   Not on file   Social Determinants of Health   Financial Resource Strain: Low Risk  (06/29/2023)   Overall Financial Resource Strain (CARDIA)    Difficulty of Paying Living Expenses: Not hard at all  Food Insecurity: No Food Insecurity (06/29/2023)   Hunger Vital Sign    Worried About Running Out of Food in the Last Year: Never true    Ran Out of Food in the Last Year: Never true  Transportation Needs: No Transportation Needs (06/29/2023)   PRAPARE - Administrator, Civil Service (Medical): No    Lack of Transportation (Non-Medical): No  Physical Activity: Sufficiently Active (06/29/2023)   Exercise Vital Sign    Days of Exercise per Week: 4 days    Minutes of  Exercise per Session: 50 min  Stress: No Stress Concern Present (06/29/2023)   [employer] of Occupational Health - Occupational Stress Questionnaire    Feeling of Stress : Only a little  Social Connections: Moderately Integrated (06/29/2023)   Social Connection and Isolation Panel [NHANES]    Frequency of Communication with Friends and Family: More than three times a week    Frequency of Social Gatherings with Friends and Family: Once a week    Attends Religious Services: More than 4 times per year    Active Member of [employer] or Organizations: Yes    Attends Engineer, structural: More than 4 times per year    Marital Status: Never married    Tobacco Counseling Counseling given: Not Answered   Clinical Intake:  Pre-visit preparation completed: Yes  Pain : No/denies pain     Nutritional Risks: None Diabetes: Yes CBG done?: No Did pt. bring in CBG monitor from home?: No  How often do you need to have someone help you when you read instructions, pamphlets, or other written materials from your doctor or pharmacy?: 1 - Never  Interpreter Needed?: No  Information entered by :: NAllen LPN   Activities of Daily Living    06/30/2023    3:57 PM  In your present state of health, do you have any difficulty performing the following activities:  Hearing? 0  Vision? 0  Difficulty concentrating or making decisions? 0  Walking or climbing stairs? 1  Comment due to knee  Dressing or bathing? 0  Doing errands, shopping? 0  Preparing Food and eating ? N  Using the Toilet? N  In the past six months, have you accidently leaked urine? N  Do you have problems with loss of bowel control? N  Managing your Medications? N  Managing your Finances? N  Housekeeping or managing your Housekeeping? N    Patient Care Team: Dorothyann Peng, MD as PCP - General (Internal Medicine) Nahser, Deloris Ping, MD as PCP - Cardiology (Cardiology) [facility]  Indicate any  recent Medical Services you may have received from other than [facility] providers  in the past year (date may be approximate).     Assessment:   This is a routine wellness examination for Timia.  Hearing/Vision screen Hearing Screening - Comments:: Denies hearing issues Vision Screening - Comments:: Regular eye exams, [facility]   Goals Addressed             This Visit's Progress    Patient Stated       06/30/2023, wants to have knee surgery and recover       Depression Screen    06/30/2023    4:05 PM 01/12/2023   10:45 AM 07/29/2022    2:49 PM 06/27/2022    9:48 AM 06/25/2021   11:18 AM 06/19/2020   11:27 AM 06/19/2020   10:53 AM  PHQ 2/9 Scores  PHQ - 2 Score 0 0 0 0 0 0 0  PHQ- 9 Score 6 0         Fall Risk    06/30/2023    4:04 PM 01/12/2023   10:45 AM 11/26/2022    8:13 AM 07/29/2022    2:49 PM 06/27/2022    9:50 AM  Fall Risk   Falls in the past year? 1 0 0 0 0  Comment slipped      Number falls in past yr: 0 0 0 0 0  Injury with Fall? 0 0 0 0 0  Risk for fall due to : Medication side effect;Impaired balance/gait;Impaired mobility No Fall Risks History of fall(s) Impaired balance/gait No Fall Risks  Follow up Falls prevention discussed;Falls evaluation completed Falls evaluation completed Falls evaluation completed Falls evaluation completed Falls evaluation completed    MEDICARE RISK AT HOME: Medicare Risk at Home Any stairs in or around the home?: No If so, are there any without handrails?: No Home free of loose throw rugs in walkways, pet beds, electrical cords, etc?: Yes Adequate lighting in your home to reduce risk of falls?: Yes Life alert?: No Use of a cane, walker or w/c?: Yes Grab bars in the bathroom?: Yes Shower chair or bench in shower?: No Elevated toilet seat or a handicapped toilet?: No  TIMED UP AND GO:  Was the test performed?  No    Cognitive Function:        06/30/2023    4:07 PM 06/27/2022    9:50 AM 06/25/2021   11:20 AM  06/19/2020   11:31 AM 06/15/2019   11:32 AM  6CIT Screen  What Year? 0 points 0 points 0 points 0 points 0 points  What month? 0 points 0 points 0 points 0 points 0 points  What time? 0 points 0 points 0 points 0 points 0 points  Count back from 20 0 points 0 points 0 points 0 points 0 points  Months in reverse 0 points 0 points 0 points 0 points 2 points  Repeat phrase 0 points 0 points 4 points 2 points 0 points  Total Score 0 points 0 points 4 points 2 points 2 points    Immunizations Immunization History  Administered Date(s) Administered   19-influenza Whole 07/15/2012   DTaP 06/18/2019   Fluad Quad(high Dose 65+) 06/19/2020, 06/03/2021   H1N1 08/09/2008   Influenza Split 07/27/2008, 06/28/2014   Influenza, High Dose Seasonal PF 05/18/2019, 06/04/2022   Influenza,inj,Quad PF,6+ Mos 06/20/2018   MMR 06/20/2010   Moderna Covid-19 Fall Seasonal Vaccine 42yrs & older 07/27/2022   Moderna SARS-COV2 Booster Vaccination 01/01/2021   Moderna Sars-Covid-2 Vaccination 11/20/2019, 12/19/2019, 08/06/2020, 01/01/2021, 08/13/2021   Pfizer Covid-19  Vaccine Bivalent Booster 22yrs & up 07/27/2022   Pneumococcal Conjugate-13 05/04/2018   Pneumococcal Polysaccharide-23 05/18/2019   Pneumococcal-Unspecified 06/28/2014   RSV,unspecified 10/22/2022   Tdap 06/15/2019   Tetanus 07/22/2005   Zoster Recombinant(Shingrix) 02/19/2021, 04/24/2021    TDAP status: Up to date  Flu Vaccine status: Due, Education has been provided regarding the importance of this vaccine. Advised may receive this vaccine at local pharmacy or Health Dept. Aware to provide a copy of the vaccination record if obtained from local pharmacy or Health Dept. Verbalized acceptance and understanding.  Pneumococcal vaccine status: Up to date  Covid-19 vaccine status: Completed vaccines  Qualifies for Shingles Vaccine? Yes   Zostavax completed Yes   Shingrix Completed?: Yes  Screening Tests Health Maintenance  Topic Date Due    FOOT EXAM  01/01/2023   INFLUENZA VACCINE  04/29/2023   COVID-19 Vaccine (7 - 2023-24 season) 05/30/2023   HEMOGLOBIN A1C  07/14/2023   OPHTHALMOLOGY EXAM  12/24/2023   MAMMOGRAM  05/11/2024   Diabetic kidney evaluation - eGFR measurement  06/03/2024   Diabetic kidney evaluation - Urine ACR  06/03/2024   Medicare Annual Wellness (AWV)  06/29/2024   DEXA SCAN  09/03/2024   DTaP/Tdap/Td (3 - Td or Tdap) 06/17/2029   Pneumonia Vaccine 99+ Years old  Completed   Hepatitis C Screening  Completed   Zoster Vaccines- Shingrix  Completed   HPV VACCINES  Aged Out   Colonoscopy  Discontinued    Health Maintenance  Health Maintenance Due  Topic Date Due   FOOT EXAM  01/01/2023   INFLUENZA VACCINE  04/29/2023   COVID-19 Vaccine (7 - 2023-24 season) 05/30/2023    Colorectal cancer screening: No longer required.   Mammogram status: Completed 05/12/2023. Repeat every year  Bone Density status: Completed 09/03/2022. Results reflect: Bone density results: OSTEOPOROSIS. Repeat every 2 years.  Lung Cancer Screening: (Low Dose CT Chest recommended if Age 23-80 years, 20 pack-year currently smoking OR have quit w/in 15years.) does not qualify.   Lung Cancer Screening Referral: no  Additional Screening:  Hepatitis C Screening: does qualify; Completed 03/24/2017  Vision Screening: Recommended annual ophthalmology exams for early detection of glaucoma and other disorders of the eye. Is the patient up to date with their annual eye exam?  Yes  Who is the provider or what is the name of the office in which the patient attends annual eye exams? [facility] If pt is not established with a provider, would they like to be referred to a provider to establish care? No .   Dental Screening: Recommended annual dental exams for proper oral hygiene  Diabetic Foot Exam: Diabetic Foot Exam: Overdue, Pt has been advised about the importance in completing this exam. Pt is scheduled for diabetic foot exam on  next appointment.  Community Resource Referral / Chronic Care Management: CRR required this visit?  No   CCM required this visit?  No     Plan:     I have personally reviewed and noted the following in the patient's chart:   Medical and social history Use of alcohol, tobacco or illicit drugs  Current medications and supplements including opioid prescriptions. Patient is not currently taking opioid prescriptions. Functional ability and status Nutritional status Physical activity Advanced directives List of other physicians Hospitalizations, surgeries, and ER visits in previous 12 months Vitals Screenings to include cognitive, depression, and falls Referrals and appointments  In addition, I have reviewed and discussed with patient certain preventive protocols, quality metrics, and  best practice recommendations. A written personalized care plan for preventive services as well as general preventive health recommendations were provided to patient.     Barb Merino, LPN   [id number]/12/[id number]   After Visit Summary: (MyChart) Due to this being a telephonic visit, the after visit summary with patients personalized plan was offered to patient via MyChart   Nurse Notes: none

## 2023-07-01 ENCOUNTER — Other Ambulatory Visit (HOSPITAL_COMMUNITY): Payer: Self-pay

## 2023-07-01 ENCOUNTER — Telehealth: Payer: Self-pay

## 2023-07-01 ENCOUNTER — Telehealth: Payer: Self-pay | Admitting: Cardiovascular Disease

## 2023-07-01 NOTE — Telephone Encounter (Signed)
Spoke to patient and she is aware of what she needs to bring in order to renew PAP. Patient will drop off documentation by the office sometime next week.

## 2023-07-01 NOTE — Telephone Encounter (Signed)
Spoke to patient, she is aware of what she needs to bring for the BMS application. Patient will drop off documentation at office within the next week or so.

## 2023-07-01 NOTE — Telephone Encounter (Signed)
Pt c/o medication issue:  1. Name of Medication: apixaban (ELIQUIS) 5 MG TABS tablet   2. How are you currently taking this medication (dosage and times per day)? As written   3. Are you having a reaction (difficulty breathing--STAT)? No   4. What is your medication issue? Pt called in stating she needs her patient assistance application renewed. Please advise.

## 2023-07-02 NOTE — Progress Notes (Signed)
COVID Vaccine Completed: yes  Date of COVID positive in last 90 days:  PCP - Dorothyann Peng, MD Cardiologist - Laqueta Carina, MD LOV 06/28/23 Hem/Onc- Wyvonnia Lora, MD  Hem/Onc clearance by Dr. Candise Che 05/28/23 on chart Clearance by Dr. Thedore Mins 04/09/23 on chart Cardiac clearance by Laqueta Carina, MD 06/28/23 in Epic   Chest x-ray -  EKG - 06/28/23 Epic Stress Test -  ECHO - 03/27/21 Epic Cardiac Cath -  Pacemaker/ICD device last checked: Spinal Cord Stimulator:  Bowel Prep -   Sleep Study -  CPAP -   Fasting Blood Sugar -  Checks Blood Sugar _____ times a day  Jardiance- hold DOS per Dr. Thedore Mins  Last dose of GLP1 agonist-  N/A GLP1 instructions:  N/A   Last dose of SGLT-2 inhibitors-  N/A SGLT-2 instructions: N/A   Blood Thinner Instructions:  Eliquis, hold 2-3 days Aspirin Instructions: Last Dose:  Activity level:  Can go up a flight of stairs and perform activities of daily living without stopping and without symptoms of chest pain or shortness of breath.  Able to exercise without symptoms  Unable to go up a flight of stairs without symptoms of     Anesthesia review: HTN, a fib, PVC, DM2, asthma, lupus, 1st degree AV block, anemia   Patient denies shortness of breath, fever, cough and chest pain at PAT appointment  Patient verbalized understanding of instructions that were given to them at the PAT appointment. Patient was also instructed that they will need to review over the PAT instructions again at home before surgery.

## 2023-07-05 ENCOUNTER — Other Ambulatory Visit: Payer: Self-pay

## 2023-07-05 ENCOUNTER — Encounter (HOSPITAL_COMMUNITY): Payer: Self-pay

## 2023-07-05 ENCOUNTER — Encounter (HOSPITAL_COMMUNITY)
Admission: RE | Admit: 2023-07-05 | Discharge: 2023-07-05 | Disposition: A | Discharge status: Home / Self Care | Payer: Medicare HMO | Source: Ambulatory Visit | Attending: Orthopedic Surgery | Admitting: Orthopedic Surgery

## 2023-07-05 VITALS — BP 152/88 | HR 62 | Temp 98.3°F | Resp 18 | Ht 58.5 in | Wt 170.0 lb

## 2023-07-05 DIAGNOSIS — N1831 Chronic kidney disease, stage 3a: Secondary | ICD-10-CM | POA: Insufficient documentation

## 2023-07-05 DIAGNOSIS — Z7984 Long term (current) use of oral hypoglycemic drugs: Secondary | ICD-10-CM | POA: Insufficient documentation

## 2023-07-05 DIAGNOSIS — I129 Hypertensive chronic kidney disease with stage 1 through stage 4 chronic kidney disease, or unspecified chronic kidney disease: Secondary | ICD-10-CM | POA: Insufficient documentation

## 2023-07-05 DIAGNOSIS — E1122 Type 2 diabetes mellitus with diabetic chronic kidney disease: Secondary | ICD-10-CM | POA: Insufficient documentation

## 2023-07-05 DIAGNOSIS — Z7901 Long term (current) use of anticoagulants: Secondary | ICD-10-CM | POA: Diagnosis not present

## 2023-07-05 DIAGNOSIS — M1711 Unilateral primary osteoarthritis, right knee: Secondary | ICD-10-CM | POA: Insufficient documentation

## 2023-07-05 DIAGNOSIS — Z01818 Encounter for other preprocedural examination: Secondary | ICD-10-CM

## 2023-07-05 DIAGNOSIS — Z01812 Encounter for preprocedural laboratory examination: Secondary | ICD-10-CM | POA: Insufficient documentation

## 2023-07-05 DIAGNOSIS — I4891 Unspecified atrial fibrillation: Secondary | ICD-10-CM | POA: Diagnosis not present

## 2023-07-05 LAB — CBC
HCT: 33.5 % — ABNORMAL LOW (ref 36.0–46.0)
Hemoglobin: 10.3 g/dL — ABNORMAL LOW (ref 12.0–15.0)
MCH: 31.5 pg (ref 26.0–34.0)
MCHC: 30.7 g/dL (ref 30.0–36.0)
MCV: 102.4 fL — ABNORMAL HIGH (ref 80.0–100.0)
Platelets: 182 10*3/uL (ref 150–400)
RBC: 3.27 MIL/uL — ABNORMAL LOW (ref 3.87–5.11)
RDW: 14.9 % (ref 11.5–15.5)
WBC: 5.8 10*3/uL (ref 4.0–10.5)
nRBC: 0 % (ref 0.0–0.2)

## 2023-07-05 LAB — BASIC METABOLIC PANEL
Anion gap: 10 (ref 5–15)
BUN: 21 mg/dL (ref 8–23)
CO2: 25 mmol/L (ref 22–32)
Calcium: 9.4 mg/dL (ref 8.9–10.3)
Chloride: 103 mmol/L (ref 98–111)
Creatinine, Ser: 1.3 mg/dL — ABNORMAL HIGH (ref 0.44–1.00)
GFR, Estimated: 42 mL/min — ABNORMAL LOW (ref >60)
Glucose, Bld: 87 mg/dL (ref 70–99)
Potassium: 4.3 mmol/L (ref 3.5–5.1)
Sodium: 138 mmol/L (ref 135–145)

## 2023-07-05 LAB — SURGICAL PCR SCREEN
MRSA, PCR: NEGATIVE
Staphylococcus aureus: NEGATIVE

## 2023-07-05 LAB — GLUCOSE, CAPILLARY: Glucose-Capillary: 72 mg/dL (ref 70–99)

## 2023-07-05 LAB — HEMOGLOBIN A1C
Hgb A1c MFr Bld: 5.4 % (ref 4.8–5.6)
Mean Plasma Glucose: 108.28 mg/dL

## 2023-07-05 NOTE — Patient Instructions (Signed)
SURGICAL WAITING ROOM VISITATION  Patients having surgery or a procedure may have no more than 2 support people in the waiting area - these visitors may rotate.    Children under the age of 50 must have an adult with them who is not the patient.  Due to an increase in RSV and influenza rates and associated hospitalizations, children ages 28 and under may not visit patients in [facility] hospitals.  If the patient needs to stay at the hospital during part of their recovery, the visitor guidelines for inpatient rooms apply. Pre-op nurse will coordinate an appropriate time for 1 support person to accompany patient in pre-op.  This support person may not rotate.    Please refer to the [facility] website for the visitor guidelines for Inpatients (after your surgery is over and you are in a regular room).    Your procedure is scheduled on: 07/12/23   Report to [facility] Main Entrance    Report to admitting at 8:00 AM   Call this number if you have problems the morning of surgery [phone]   Do not eat food :After Midnight.   After Midnight you may have the following liquids until 7:30 AM DAY OF SURGERY  Water Non-Citrus Juices (without pulp, NO RED-Apple, White grape, White cranberry) Black Coffee (NO MILK/CREAM OR CREAMERS, sugar ok)  Clear Tea (NO MILK/CREAM OR CREAMERS, sugar ok) regular and decaf                             Plain Jell-O (NO RED)                                           Fruit ices (not with fruit pulp, NO RED)                                     Popsicles (NO RED)                                                               Sports drinks like Gatorade (NO RED)                 The day of surgery:  Drink ONE (1) Pre-Surgery G2 at 7:30 AM the morning of surgery. Drink in one sitting. Do not sip.  This drink was given to you during your hospital  pre-op appointment visit. Nothing else to drink after completing the  Pre-Surgery G2.          If you  have questions, please contact your surgeon's office.   FOLLOW BOWEL PREP AND ANY ADDITIONAL PRE OP INSTRUCTIONS YOU RECEIVED FROM YOUR SURGEON'S OFFICE!!!     Oral Hygiene is also important to reduce your risk of infection.                                    Remember - BRUSH YOUR TEETH THE MORNING OF SURGERY WITH YOUR REGULAR TOOTHPASTE  DENTURES WILL BE REMOVED  PRIOR TO SURGERY PLEASE DO NOT APPLY "Poly grip" OR ADHESIVES!!!   Do NOT smoke after Midnight   Stop all vitamins and herbal supplements 7 days before surgery.   Take these medicines the morning of surgery with A SIP OF WATER: Albuterol, Allopurinol, Symbicort, Metoprolol, Synthroid   DO NOT TAKE ANY ORAL DIABETIC MEDICATIONS DAY OF YOUR SURGERY  How to Manage Your Diabetes Before and After Surgery  Why is it important to control my blood sugar before and after surgery? Improving blood sugar levels before and after surgery helps healing and can limit problems. A way of improving blood sugar control is eating a healthy diet by:  Eating less sugar and carbohydrates  Increasing activity/exercise  Talking with your doctor about reaching your blood sugar goals High blood sugars (greater than 180 mg/dL) can raise your risk of infections and slow your recovery, so you will need to focus on controlling your diabetes during the weeks before surgery. Make sure that the doctor who takes care of your diabetes knows about your planned surgery including the date and location.  How do I manage my blood sugar before surgery? Check your blood sugar at least 4 times a day, starting 2 days before surgery, to make sure that the level is not too high or low. Check your blood sugar the morning of your surgery when you wake up and every 2 hours until you get to the Short Stay unit. If your blood sugar is less than 70 mg/dL, you will need to treat for low blood sugar: Do not take insulin. Treat a low blood sugar (less than 70 mg/dL) with  cup  of clear juice (cranberry or apple), 4 glucose tablets, OR glucose gel. Recheck blood sugar in 15 minutes after treatment (to make sure it is greater than 70 mg/dL). If your blood sugar is not greater than 70 mg/dL on recheck, call [phone] for further instructions. Report your blood sugar to the short stay nurse when you get to Short Stay.  If you are admitted to the hospital after surgery: Your blood sugar will be checked by the staff and you will probably be given insulin after surgery (instead of oral diabetes medicines) to make sure you have good blood sugar levels. The goal for blood sugar control after surgery is 80-180 mg/dL.   WHAT DO I DO ABOUT MY DIABETES MEDICATION?  Do not take oral diabetes medicines (pills) the morning of surgery.  THE DAY BEFORE SURGERY, take Jardiance as prescribed.     THE MORNING OF SURGERY, do not take Jardiance   Reviewed and Endorsed by [facility] Patient Education Committee, August 2015  Bring CPAP mask and tubing day of surgery.                              You may not have any metal on your body including hair pins, jewelry, and body piercing             Do not wear make-up, lotions, powders, perfumes, or deodorant  Do not wear nail polish including gel and S&S, artificial/acrylic nails, or any other type of covering on natural nails including finger and toenails. If you have artificial nails, gel coating, etc. that needs to be removed by a nail salon please have this removed prior to surgery or surgery may need to be canceled/ delayed if the surgeon/ anesthesia feels like they are unable to be safely monitored.  Do not shave  48 hours prior to surgery.    Do not bring valuables to the hospital. [place] IS NOT             RESPONSIBLE   FOR VALUABLES.   Contacts, glasses, dentures or bridgework may not be worn into surgery.   Bring small overnight bag day of surgery.   DO NOT BRING YOUR HOME MEDICATIONS TO THE HOSPITAL. PHARMACY  WILL DISPENSE MEDICATIONS LISTED ON YOUR MEDICATION LIST TO YOU DURING YOUR ADMISSION IN THE HOSPITAL!              Please read over the following fact sheets you were given: IF YOU HAVE QUESTIONS ABOUT YOUR PRE-OP INSTRUCTIONS PLEASE CALL [phone] [name]    If you received a COVID test during your pre-op visit  it is requested that you wear a mask when out in public, stay away from anyone that may not be feeling well and notify your surgeon if you develop symptoms. If you test positive for Covid or have been in contact with anyone that has tested positive in the last 10 days please notify you surgeon.      Pre-operative 5 CHG Bath Instructions   You can play a key role in reducing the risk of infection after surgery. Your skin needs to be as free of germs as possible. You can reduce the number of germs on your skin by washing with CHG (chlorhexidine gluconate) soap before surgery. CHG is an antiseptic soap that kills germs and continues to kill germs even after washing.   DO NOT use if you have an allergy to chlorhexidine/CHG or antibacterial soaps. If your skin becomes reddened or irritated, stop using the CHG and notify one of our RNs at [phone].   Please shower with the CHG soap starting 4 days before surgery using the following schedule:     Please keep in mind the following:  DO NOT shave, including legs and underarms, starting the day of your first shower.   You may shave your face at any point before/day of surgery.  Place clean sheets on your bed the day you start using CHG soap. Use a clean washcloth (not used since being washed) for each shower. DO NOT sleep with pets once you start using the CHG.   CHG Shower Instructions:  If you choose to wash your hair and private area, wash first with your normal shampoo/soap.  After you use shampoo/soap, rinse your hair and body thoroughly to remove shampoo/soap residue.  Turn the water OFF and apply about 3 tablespoons (45 ml)  of CHG soap to a CLEAN washcloth.  Apply CHG soap ONLY FROM YOUR NECK DOWN TO YOUR TOES (washing for 3-5 minutes)  DO NOT use CHG soap on face, private areas, open wounds, or sores.  Pay special attention to the area where your surgery is being performed.  If you are having back surgery, having someone wash your back for you may be helpful. Wait 2 minutes after CHG soap is applied, then you may rinse off the CHG soap.  Pat dry with a clean towel  Put on clean clothes/pajamas   If you choose to wear lotion, please use ONLY the CHG-compatible lotions on the back of this paper.     Additional instructions for the day of surgery: DO NOT APPLY any lotions, deodorants, cologne, or perfumes.   Put on clean/comfortable clothes.  Brush your teeth.  Ask your nurse before applying any prescription medications to the  skin.      CHG Compatible Lotions   Aveeno Moisturizing lotion  Cetaphil Moisturizing Cream  Cetaphil Moisturizing Lotion  Clairol Herbal Essence Moisturizing Lotion, Dry Skin  Clairol Herbal Essence Moisturizing Lotion, Extra Dry Skin  Clairol Herbal Essence Moisturizing Lotion, Normal Skin  Curel Age Defying Therapeutic Moisturizing Lotion with Alpha Hydroxy  Curel Extreme Care Body Lotion  Curel Soothing Hands Moisturizing Hand Lotion  Curel Therapeutic Moisturizing Cream, Fragrance-Free  Curel Therapeutic Moisturizing Lotion, Fragrance-Free  Curel Therapeutic Moisturizing Lotion, Original Formula  Eucerin Daily Replenishing Lotion  Eucerin Dry Skin Therapy Plus Alpha Hydroxy Crme  Eucerin Dry Skin Therapy Plus Alpha Hydroxy Lotion  Eucerin Original Crme  Eucerin Original Lotion  Eucerin Plus Crme Eucerin Plus Lotion  Eucerin TriLipid Replenishing Lotion  Keri Anti-Bacterial Hand Lotion  Keri Deep Conditioning Original Lotion Dry Skin Formula Softly Scented  Keri Deep Conditioning Original Lotion, Fragrance Free Sensitive Skin Formula  Keri Lotion Fast Absorbing  Fragrance Free Sensitive Skin Formula  Keri Lotion Fast Absorbing Softly Scented Dry Skin Formula  Keri Original Lotion  Keri Skin Renewal Lotion Keri Silky Smooth Lotion  Keri Silky Smooth Sensitive Skin Lotion  Nivea Body Creamy Conditioning Oil  Nivea Body Extra Enriched Lotion  Nivea Body Original Lotion  Nivea Body Sheer Moisturizing Lotion Nivea Crme  Nivea Skin Firming Lotion  NutraDerm 30 Skin Lotion  NutraDerm Skin Lotion  NutraDerm Therapeutic Skin Cream  NutraDerm Therapeutic Skin Lotion  ProShield Protective Hand Cream  Provon moisturizing lotion   Incentive Spirometer  An incentive spirometer is a tool that can help keep your lungs clear and active. This tool measures how well you are filling your lungs with each breath. Taking long deep breaths may help reverse or decrease the chance of developing breathing (pulmonary) problems (especially infection) following: A long period of time when you are unable to move or be active. BEFORE THE PROCEDURE  If the spirometer includes an indicator to show your best effort, your nurse or respiratory therapist will set it to a desired goal. If possible, sit up straight or lean slightly forward. Try not to slouch. Hold the incentive spirometer in an upright position. INSTRUCTIONS FOR USE  Sit on the edge of your bed if possible, or sit up as far as you can in bed or on a chair. Hold the incentive spirometer in an upright position. Breathe out normally. Place the mouthpiece in your mouth and seal your lips tightly around it. Breathe in slowly and as deeply as possible, raising the piston or the ball toward the top of the column. Hold your breath for 3-5 seconds or for as long as possible. Allow the piston or ball to fall to the bottom of the column. Remove the mouthpiece from your mouth and breathe out normally. Rest for a few seconds and repeat Steps 1 through 7 at least 10 times every 1-2 hours when you are awake. Take your time and  take a few normal breaths between deep breaths. The spirometer may include an indicator to show your best effort. Use the indicator as a goal to work toward during each repetition. After each set of 10 deep breaths, practice coughing to be sure your lungs are clear. If you have an incision (the cut made at the time of surgery), support your incision when coughing by placing a pillow or rolled up towels firmly against it. Once you are able to get out of bed, walk around indoors and cough well. You may stop using  the incentive spirometer when instructed by your caregiver.  RISKS AND COMPLICATIONS Take your time so you do not get dizzy or light-headed. If you are in pain, you may need to take or ask for pain medication before doing incentive spirometry. It is harder to take a deep breath if you are having pain. AFTER USE Rest and breathe slowly and easily. It can be helpful to keep track of a log of your progress. Your caregiver can provide you with a simple table to help with this. If you are using the spirometer at home, follow these instructions: SEEK MEDICAL CARE IF:  You are having difficultly using the spirometer. You have trouble using the spirometer as often as instructed. Your pain medication is not giving enough relief while using the spirometer. You develop fever of 100.5 F (38.1 C) or higher. SEEK IMMEDIATE MEDICAL CARE IF:  You cough up bloody sputum that had not been present before. You develop fever of 102 F (38.9 C) or greater. You develop worsening pain at or near the incision site. MAKE SURE YOU:  Understand these instructions. Will watch your condition. Will get help right away if you are not doing well or get worse. Document Released: 01/25/2007 Document Revised: 12/07/2011 Document Reviewed: 03/28/2007 [facility] Patient Information 2014 [place], [place].   ________________________________________________________________________

## 2023-07-06 NOTE — Progress Notes (Signed)
Anesthesia Chart Review   Case: [id number] Date/Time: 07/12/23 1015   Procedure: TOTAL KNEE ARTHROPLASTY (Right: Knee)   Anesthesia type: Choice   Pre-op diagnosis: right knee osteoarthritis   Location: [facility] ROOM 10 / [facility]   Surgeons: Ollen Gross, MD       DISCUSSION:76 y.o. never smoker with h/o HTN, DM II, Lupus, CKD, atrial fibrillation, right knee OA scheduled for above procedure 07/12/2023 with Dr. Ollen Gross.   Pt last seen by cardiology 06/28/2023. Per OV note, "She is at low risk for her upcoming knee surgery. She may hold her Eliquis for 2 to 3 days prior to her surgery. She should restart Eliquis as soon as it is safe from a surgical standpoint. "  Pt reports last dose of Eliquis 07/08/2023.   VS: BP (!) 152/88   Pulse 62   Temp 36.8 C (Oral)   Resp 18   Ht 4' 10.5" (1.486 m)   Wt 77.1 kg   SpO2 99%   BMI 34.93 kg/m   PROVIDERS: Dorothyann Peng, MD is PCP  Laqueta Carina, MD is Cardiologist  LABS: Labs reviewed: Acceptable for surgery. (all labs ordered are listed, but only abnormal results are displayed)  Labs Reviewed  BASIC METABOLIC PANEL - Abnormal; Notable for the following components:      Result Value   Creatinine, Ser 1.30 (*)    GFR, Estimated 42 (*)    All other components within normal limits  CBC - Abnormal; Notable for the following components:   RBC 3.27 (*)    Hemoglobin 10.3 (*)    HCT 33.5 (*)    MCV 102.4 (*)    All other components within normal limits  SURGICAL PCR SCREEN  HEMOGLOBIN A1C  GLUCOSE, CAPILLARY     IMAGES:   EKG:   CV: Echo 03/27/2021 1.Left ventricular ejection fraction, by estimation, is 55 to 60%. The  left ventricle has normal function. The left ventricle has no regional  wall motion abnormalities. There is mild asymmetric left ventricular  hypertrophy of the basal-septal segment.  Left ventricular diastolic parameters are consistent with Grade I  diastolic dysfunction (impaired relaxation).   2.  Right ventricular systolic function is normal. The right ventricular  size is normal. There is normal pulmonary artery systolic pressure. The  estimated right ventricular systolic pressure is 27.2 mmHg.   3. The mitral valve is grossly normal. Trivial mitral valve  regurgitation. No evidence of mitral stenosis.   4. The aortic valve is tricuspid. There is mild calcification of the  aortic valve. Aortic valve regurgitation is not visualized. Mild aortic  valve sclerosis is present, with no evidence of aortic valve stenosis.   5. The inferior vena cava is normal in size with greater than 50%  respiratory variability, suggesting right atrial pressure of 3 mmHg.   Comparison(s): No significant change from prior study.  Past Medical History:  Diagnosis Date   Allergy 25 years ago.   Anemia    Arthritis    Asthma    Atrial fibrillation (HCC)    Cataract Had cataracts surgery 2015 & 2016   Chronic kidney disease    Diabetes mellitus without complication (HCC)    Gout 12/17/2014   patient reported   Heart murmur 4 years ago   Hyperlipidemia    Hypertension    Hypothyroidism    Systemic lupus erythematosus (HCC)    Vitiligo     Past Surgical History:  Procedure Laterality Date   CATARACT EXTRACTION Bilateral 2015  DILATION AND CURETTAGE OF UTERUS     DOPPLER ECHOCARDIOGRAPHY  05/2018   Internist to review with pt; potential heart murmur 06/20/18   EYE SURGERY  Cataract Surgery, 2015 & 2016.   keratosis removal  2021   KNEE CLOSED REDUCTION Left 07/06/2022   Procedure: CLOSED MANIPULATION KNEE;  Surgeon: Ollen Gross, MD;  Location: [facility];  Service: Orthopedics;  Laterality: Left;   REPLACEMENT TOTAL KNEE Left    SKIN SURGERY  11/30/2018   left side of face   TOOTH EXTRACTION     TOTAL KNEE ARTHROPLASTY Left 02/02/2022   Procedure: TOTAL KNEE ARTHROPLASTY;  Surgeon: Ollen Gross, MD;  Location: [facility];  Service: Orthopedics;  Laterality: Left;    MEDICATIONS:  Accu-Chek  Softclix Lancets lancets   albuterol (PROVENTIL HFA;VENTOLIN HFA) 108 (90 Base) MCG/ACT inhaler   Alcohol Swabs (ALCOHOL PADS) 70 % PADS   allopurinol (ZYLOPRIM) 100 MG tablet   amLODipine (NORVASC) 5 MG tablet   apixaban (ELIQUIS) 5 MG TABS tablet   Ascorbic Acid (VITAMIN C PO)   atorvastatin (LIPITOR) 80 MG tablet   B Complex-C (B-COMPLEX WITH VITAMIN C) tablet   budesonide-formoterol (SYMBICORT) 160-4.5 MCG/ACT inhaler   Calcium Carb-Cholecalciferol 600-800 MG-UNIT TABS   Cholecalciferol (VITAMIN D PO)   denosumab (PROLIA) 60 MG/ML SOSY injection   glucose blood (ACCU-CHEK GUIDE) test strip   hydroxychloroquine (PLAQUENIL) 200 MG tablet   irbesartan (AVAPRO) 300 MG tablet   Iron Sucrose (VENOFER IV)   JARDIANCE 10 MG TABS tablet   metoprolol succinate (TOPROL-XL) 25 MG 24 hr tablet   Multiple Vitamin (MULTIVITAMIN WITH MINERALS) TABS tablet   Polyethyl Glycol-Propyl Glycol (SYSTANE) 0.4-0.3 % SOLN   SYNTHROID 88 MCG tablet   No current facility-administered medications for this encounter.    Jodell Cipro Ward, PA-C [facility] [phone]

## 2023-07-07 ENCOUNTER — Encounter: Payer: Self-pay | Admitting: Internal Medicine

## 2023-07-07 ENCOUNTER — Other Ambulatory Visit: Payer: Self-pay

## 2023-07-07 ENCOUNTER — Ambulatory Visit: Payer: Medicare HMO | Admitting: Internal Medicine

## 2023-07-07 VITALS — BP 122/78 | HR 68 | Temp 98.6°F | Ht <= 58 in | Wt 170.4 lb

## 2023-07-07 DIAGNOSIS — N1831 Chronic kidney disease, stage 3a: Secondary | ICD-10-CM

## 2023-07-07 DIAGNOSIS — D6869 Other thrombophilia: Secondary | ICD-10-CM

## 2023-07-07 DIAGNOSIS — G8929 Other chronic pain: Secondary | ICD-10-CM

## 2023-07-07 DIAGNOSIS — E1122 Type 2 diabetes mellitus with diabetic chronic kidney disease: Secondary | ICD-10-CM | POA: Diagnosis not present

## 2023-07-07 DIAGNOSIS — I1310 Hypertensive heart and chronic kidney disease without heart failure, with stage 1 through stage 4 chronic kidney disease, or unspecified chronic kidney disease: Secondary | ICD-10-CM

## 2023-07-07 DIAGNOSIS — R053 Chronic cough: Secondary | ICD-10-CM

## 2023-07-07 DIAGNOSIS — Z6835 Body mass index (BMI) 35.0-35.9, adult: Secondary | ICD-10-CM | POA: Diagnosis not present

## 2023-07-07 DIAGNOSIS — E66812 Obesity, class 2: Secondary | ICD-10-CM | POA: Diagnosis not present

## 2023-07-07 DIAGNOSIS — J4520 Mild intermittent asthma, uncomplicated: Secondary | ICD-10-CM | POA: Diagnosis not present

## 2023-07-07 DIAGNOSIS — E7800 Pure hypercholesterolemia, unspecified: Secondary | ICD-10-CM | POA: Diagnosis not present

## 2023-07-07 DIAGNOSIS — Z8616 Personal history of COVID-19: Secondary | ICD-10-CM

## 2023-07-07 DIAGNOSIS — I10 Essential (primary) hypertension: Secondary | ICD-10-CM

## 2023-07-07 DIAGNOSIS — D638 Anemia in other chronic diseases classified elsewhere: Secondary | ICD-10-CM

## 2023-07-07 MED ORDER — AMLODIPINE BESYLATE 5 MG PO TABS: 5.0000 mg | ORAL_TABLET | Freq: Every day | ORAL | 2 refills | Status: DC

## 2023-07-07 MED ORDER — BENZONATATE 100 MG PO CAPS: 100.0000 mg | ORAL_CAPSULE | Freq: Three times a day (TID) | ORAL | 0 refills | Status: DC | PRN

## 2023-07-07 NOTE — H&P (Signed)
TOTAL KNEE ADMISSION H&P  Patient is being admitted for right total knee arthroplasty.  Subjective:  Chief Complaint: Right knee pain.  HPI: Hannah [name], 77 y.o. female has a history of pain and functional disability in the right knee due to arthritis and has failed non-surgical conservative treatments for greater than 12 weeks to include corticosteriod injections, viscosupplementation injections, and activity modification. Onset of symptoms was gradual, starting 5 years ago with gradually worsening course since that time. The patient noted no past surgery on the right knee.  Patient currently rates pain in the right knee at 7 out of 10 with activity. Patient has worsening of pain with activity and weight bearing and pain that interferes with activities of daily living. Patient has evidence of periarticular osteophytes and joint space narrowing by imaging studies. There is no active infection.  Patient Active Problem List   Diagnosis Date Noted   Acquired thrombophilia (HCC) 01/12/2023   Arthrofibrosis of total knee arthroplasty (HCC) 07/06/2022   OA (osteoarthritis) of knee 02/02/2022   Primary osteoarthritis of left knee 02/02/2022   Chronic bilateral low back pain without sciatica 06/25/2021   Paresthesia and pain of extremity 06/25/2021   Atrial fibrillation (HCC) 04/16/2021   Demand ischemia (HCC) 03/26/2021   Unspecified atrial fibrillation (HCC) 03/26/2021   COVID-19 virus infection 03/26/2021   PVC (premature ventricular contraction) 05/06/2020   Type 2 diabetes mellitus with stage 3 chronic kidney disease, without long-term current use of insulin (HCC) 03/20/2020   Palpitations 03/20/2020   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/2020   Primary hypothyroidism 03/20/2020   Vitiligo 03/20/2020   Iron deficiency anemia 08/12/2017   Osteoporosis 10/02/2016   Systemic lupus erythematosus (HCC) 10/02/2016   Rheumatoid arthritis involving multiple sites with positive rheumatoid factor  (HCC) 10/02/2016   High risk medication use 10/02/2016   History of chronic kidney disease 10/02/2016   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/2016   Primary osteoarthritis of both knees 10/02/2016   Vitamin D deficiency 10/02/2016   History of diabetes mellitus 10/02/2016   History of hypertension 10/02/2016   History of asthma 10/02/2016   Absolute anemia    MGUS (monoclonal gammopathy of unknown significance)    Normocytic anemia 03/21/2015   Essential hypertension 03/20/2015   Abnormal CT of the chest 07/30/2012   Asthma 06/19/2012    Past Medical History:  Diagnosis Date   Allergy 25 years ago.   Anemia    Arthritis    Asthma    Atrial fibrillation (HCC)    Cataract Had cataracts surgery 2015 & 2016   Chronic kidney disease    Diabetes mellitus without complication (HCC)    Gout 12/17/2014   patient reported   Heart murmur 4 years ago   Hyperlipidemia    Hypertension    Hypothyroidism    Systemic lupus erythematosus (HCC)    Vitiligo     Past Surgical History:  Procedure Laterality Date   CATARACT EXTRACTION Bilateral 2015   DILATION AND CURETTAGE OF UTERUS     DOPPLER ECHOCARDIOGRAPHY  05/2018   Internist to review with pt; potential heart murmur 06/20/18   EYE SURGERY  Cataract Surgery, 2015 & 2016.   keratosis removal  2021   KNEE CLOSED REDUCTION Left 07/06/2022   Procedure: CLOSED MANIPULATION KNEE;  Surgeon: Ollen Gross, MD;  Location: [facility];  Service: Orthopedics;  Laterality: Left;   REPLACEMENT TOTAL KNEE Left    SKIN SURGERY  11/30/2018   left side of face   TOOTH  EXTRACTION     TOTAL KNEE ARTHROPLASTY Left 02/02/2022   Procedure: TOTAL KNEE ARTHROPLASTY;  Surgeon: Ollen Gross, MD;  Location: [facility];  Service: Orthopedics;  Laterality: Left;    Prior to Admission medications   Medication Sig Start Date End Date Taking? Authorizing Provider  albuterol (PROVENTIL HFA;VENTOLIN HFA) 108 (90 Base) MCG/ACT inhaler Inhale  2 puffs into the lungs every 6 (six) hours as needed for wheezing or shortness of breath. 09/02/18  Yes Arnette Felts, FNP  allopurinol (ZYLOPRIM) 100 MG tablet Take 1 tablet (100 mg total) by mouth 3 (three) times a week for 90 doses. Patient taking differently: Take 100 mg by mouth every Monday, Wednesday, and Friday. 11/11/22 06/22/25 Yes Dorothyann Peng, MD  apixaban (ELIQUIS) 5 MG TABS tablet Take 1 tablet (5 mg total) by mouth 2 (two) times daily. 12/23/22 06/22/24 Yes Nahser, Deloris Ping, MD  Ascorbic Acid (VITAMIN C PO) Take 500 mg by mouth daily with lunch.   Yes [provider]  atorvastatin (LIPITOR) 80 MG tablet TAKE 1 TABLET BY MOUTH EVERY DAY Patient taking differently: Take 80 mg by mouth at bedtime. 01/11/23  Yes Dorothyann Peng, MD  B Complex-C (B-COMPLEX WITH VITAMIN C) tablet Take 1 tablet by mouth daily with lunch.   Yes [provider]  budesonide-formoterol (SYMBICORT) 160-4.5 MCG/ACT inhaler INHALE 2 PUFFS BY MOUTH TWICE DAILY IN THE MORNING AND IN THE EVENING Patient taking differently: 2 puffs 2 (two) times daily as needed (respiratory issues.). 09/08/22  Yes Dorothyann Peng, MD  Calcium Carb-Cholecalciferol 600-800 MG-UNIT TABS Take 1 tablet by mouth daily with lunch.   Yes [provider]  Cholecalciferol (VITAMIN D PO) Take 2,000 Units by mouth daily with lunch.   Yes [provider]  denosumab (PROLIA) 60 MG/ML SOSY injection Inject 60 mg into the skin every 6 (six) months. Courier to rheum: [address], [address], [place] [address]. Appt on 12/07/2022 11/26/22 11/26/23 Yes Deveshwar, Janalyn Rouse, MD  hydroxychloroquine (PLAQUENIL) 200 MG tablet TAKE 1 TABLET( 200 MG) BY MOUTH IN THE MORNING AND 1/2 TABLET (100 MG) IN THE EVENING MONDAY THROUGH FRIDAY ONLY. 04/20/23  Yes Deveshwar, Janalyn Rouse, MD  irbesartan (AVAPRO) 300 MG tablet TAKE 1 TABLET(300 MG) BY MOUTH DAILY 01/08/23  Yes Dorothyann Peng, MD  Iron Sucrose (VENOFER IV) Inject 1 Dose into the vein as  needed (per patient labs).   Yes [provider]  JARDIANCE 10 MG TABS tablet Take 10 mg by mouth in the morning. 01/08/23  Yes [provider]  metoprolol succinate (TOPROL-XL) 25 MG 24 hr tablet TAKE 1 TABLET(25 MG) BY MOUTH DAILY 04/05/23  Yes Nahser, Deloris Ping, MD  Multiple Vitamin (MULTIVITAMIN WITH MINERALS) TABS tablet Take 1 tablet by mouth daily with lunch. Women's Multivitamin   Yes [provider]  Polyethyl Glycol-Propyl Glycol (SYSTANE) 0.4-0.3 % SOLN Place 1-2 drops into both eyes 3 (three) times daily as needed (dry/irritated eyes.).   Yes [provider]  SYNTHROID 88 MCG tablet TAKE 1 TABLET BY MOUTH EVERY DAY MONDAY TO SATURDAY AND OFF ON SUNDAYS 07/29/22  Yes Dorothyann Peng, MD  Accu-Chek Softclix Lancets lancets Use to check blood sugars daily E11.69 11/04/20   Dorothyann Peng, MD  Alcohol Swabs (ALCOHOL PADS) 70 % PADS Use as directed to check blood sugars 1 time per day dx: e11.22 08/03/22   Dorothyann Peng, MD  amLODipine (NORVASC) 5 MG tablet Take 1 tablet (5 mg total) by mouth at bedtime. 07/07/23   Dorothyann Peng, MD  benzonatate (TESSALON PERLES) 100 MG capsule Take 1 capsule (100 mg total) by mouth 3 (three) times daily as needed for cough. 07/07/23 07/06/24  Dorothyann Peng, MD  glucose blood (ACCU-CHEK GUIDE) test strip USE TO CHECK BLOOD SUGAR DAILY 02/23/23   Dorothyann Peng, MD    Allergies  Allergen Reactions   Shellfish Allergy Anaphylaxis and Hives    Social History   Socioeconomic History   Marital status: Single    Spouse name: Not on file   Number of children: 0   Years of education: Not on file   Highest education level: Bachelor's degree (e.g., BA, AB, BS)  Occupational History   Occupation: retired  Tobacco Use   Smoking status: Never    Passive exposure: Never   Smokeless tobacco: Never  Vaping Use   Vaping status: Never Used  Substance and Sexual Activity   Alcohol use: Not Currently    Comment: Maybe a glass of wine  every 3 months.   Drug use: No   Sexual activity: Not Currently    Birth control/protection: None    Comment: 1ST intercourse- 19 , partners- 6,   Other Topics Concern   Not on file  Social History Narrative   Not on file   Social Determinants of Health   Financial Resource Strain: Low Risk  (06/29/2023)   Overall Financial Resource Strain (CARDIA)    Difficulty of Paying Living Expenses: Not hard at all  Food Insecurity: No Food Insecurity (06/29/2023)   Hunger Vital Sign    Worried About Running Out of Food in the Last Year: Never true    Ran Out of Food in the Last Year: Never true  Transportation Needs: No Transportation Needs (06/29/2023)   PRAPARE - Administrator, Civil Service (Medical): No    Lack of Transportation (Non-Medical): No  Physical Activity: Sufficiently Active (06/29/2023)   Exercise Vital Sign    Days of Exercise per Week: 4 days    Minutes of Exercise per Session: 50 min  Stress: No Stress Concern Present (06/29/2023)   [employer] of Occupational Health - Occupational Stress Questionnaire    Feeling of Stress : Only a little  Social Connections: Moderately Integrated (06/29/2023)   Social Connection and Isolation Panel [NHANES]    Frequency of Communication with Friends and Family: More than three times a week    Frequency of Social Gatherings with Friends and Family: Once a week    Attends Religious Services: More than 4 times per year    Active Member of [employer] or Organizations: Yes    Attends Engineer, structural: More than 4 times per year    Marital Status: Never married  Intimate Partner Violence: Not At Risk (06/27/2022)   Humiliation, Afraid, Rape, and Kick questionnaire    Fear of Current or Ex-Partner: No    Emotionally Abused: No    Physically Abused: No    Sexually Abused: No    Tobacco Use: Low Risk  (07/07/2023)   Patient History    Smoking Tobacco Use: Never    Smokeless Tobacco Use: Never    Passive Exposure:  Never   Social History   Substance and Sexual Activity  Alcohol Use Not Currently   Comment: Maybe a glass of wine every 3 months.    Family History  Problem Relation Age of Onset   Heart disease Father    Diabetes Father    Hypertension Mother    Obesity Mother    Varicose Veins  Mother    Hypertension Sister    Hypertension Sister    Leukemia Sister    Cancer Sister    Obesity Sister     ROS  Objective:  Physical Exam: - Well-developed female alert and oriented in no apparent distress  - Evaluation of her left knee shows no swelling. Range of motion is 0 to 115. There is no tenderness or instability about the left knee.  - Right knee shows no effusion. Range of motion is about 5 to 110 with marked crepitus on range of motion. Some tenderness medially. No lateral tenderness or instability.   Vital signs in last 24 hours: Temp:  [98.6 F (37 C)] 98.6 F (37 C) (10/09 1041) Pulse Rate:  [68] 68 (10/09 1041) BP: (122)/(78) 122/78 (10/09 1041) SpO2:  [98 %] 98 % (10/09 1041) Weight:  [77.3 kg] 77.3 kg (10/09 1041)  Imaging Review Radiographs of both knees and lateral left demonstrate that the patient has a prosthesis on the left in excellent position with no periprosthetic abnormalities. On the right, there are tricompartmental bone-on-bone changes.   Assessment/Plan:  End stage arthritis, right knee   The patient history, physical examination, clinical judgment of the provider and imaging studies are consistent with end stage degenerative joint disease of the right knee and total knee arthroplasty is deemed medically necessary. The treatment options including medical management, injection therapy arthroscopy and arthroplasty were discussed at length. The risks and benefits of total knee arthroplasty were presented and reviewed. The risks due to aseptic loosening, infection, stiffness, patella tracking problems, thromboembolic complications and other imponderables were  discussed. The patient acknowledged the explanation, agreed to proceed with the plan and consent was signed. Patient is being admitted for inpatient treatment for surgery, pain control, PT, OT, prophylactic antibiotics, VTE prophylaxis, progressive ambulation and ADLs and discharge planning. The patient is planning to be discharged home.   Patient's anticipated LOS is less than 2 midnights, meeting these requirements: - Younger than 53 - Lives within 1 hour of care - Has a competent adult at home to recover with post-op recover - NO history of  - Chronic pain requiring opiods  - Coronary Artery Disease  - Heart failure  - Heart attack  - Stroke  - DVT/VTE  - Respiratory Failure/COPD  - Renal failure  - Advanced Liver disease  Therapy Plans: [facility] on [address] Disposition: Home with sister Planned DVT Prophylaxis: Eliquis DME Needed: RW ordered PCP: Dorothyann Peng, MD (clearance pending**) Cardiology: Laqueta Carina, MD (clearance received) Nephrology: Anthony Sar, MD (clearance received) Hematology: Wyvonnia Lora, MD (clearance received) TXA: IV Allergies: shellfish (anaphylaxis) Anesthesia Concerns: None BMI: 35.5 Last HgbA1c: 5.4% on 07/05/23 Pharmacy: [facility] - bring to pt room  Other: -Hold Jardiance on DOS -Hold Eliquis 3 days prior to surgery -Per Heme, IV Venofer 300mg  weekly x 3 doses pre-op - completed 3 weeks ago per pt -pt will bring clearance form to Dr Allyne Gee today  - Patient was instructed on what medications to stop prior to surgery. - Follow-up visit in 2 weeks with Dr. Lequita Halt - Begin physical therapy following surgery - Pre-operative lab work as pre-surgical testing - Prescriptions will be provided in hospital at time of discharge  Weston Brass, PA-C Orthopedic Surgery [facility] [place]

## 2023-07-07 NOTE — Progress Notes (Signed)
I,[name], CMA,acting as a Neurosurgeon for [name], MD.,have documented all relevant documentation on the behalf of [name], MD,as directed by  [name], MD while in the presence of [name], MD.  Subjective:  Patient ID: Hannah [name] , female    DOB: 14-Mar-1946 , 77 y.o.   MRN: [id number]  Chief Complaint  Patient presents with   Hypertension   Diabetes    HPI  She presents for diabetes and blood pressure check. She reports compliance with meds.She denies headaches, chest pain and shortness of breath.   She admits feeling much better since recently getting over Covid. She is scheduled for right knee total arthroplasty on 07/12/23.     Diabetes She presents for her follow-up diabetic visit. She has type 2 diabetes mellitus. There are no hypoglycemic associated symptoms. Pertinent negatives for hypoglycemia include no headaches. Pertinent negatives for diabetes include no blurred vision and no chest pain. There are no hypoglycemic complications. Diabetic complications include nephropathy. Risk factors for coronary artery disease include diabetes mellitus, dyslipidemia, hypertension, post-menopausal and sedentary lifestyle. She is following a diabetic diet. She participates in exercise three times a week. An ACE inhibitor/angiotensin II receptor blocker is being taken.  Hypertension This is a chronic problem. The current episode started more than 1 year ago. The problem has been gradually improving since onset. The problem is controlled. Pertinent negatives include no blurred vision, chest pain or headaches.     Past Medical History:  Diagnosis Date   Allergy 25 years ago.   Anemia    Arthritis    Asthma    Atrial fibrillation (HCC)    Cataract Had cataracts surgery 2015 & 2016   Chronic kidney disease    Diabetes mellitus without complication (HCC)    Gout 12/17/2014   patient reported   Heart murmur 4 years ago   Hyperlipidemia     Hypertension    Hypothyroidism    Systemic lupus erythematosus (HCC)    Vitiligo      Family History  Problem Relation Age of Onset   Heart disease Father    Diabetes Father    Hypertension Mother    Obesity Mother    Varicose Veins Mother    Hypertension Sister    Hypertension Sister    Leukemia Sister    Cancer Sister    Obesity Sister      Current Outpatient Medications:    Accu-Chek Softclix Lancets lancets, Use to check blood sugars daily E11.69, Disp: 100 each, Rfl: 2   albuterol (PROVENTIL HFA;VENTOLIN HFA) 108 (90 Base) MCG/ACT inhaler, Inhale 2 puffs into the lungs every 6 (six) hours as needed for wheezing or shortness of breath., Disp: 1 Inhaler, Rfl: 2   Alcohol Swabs (ALCOHOL PADS) 70 % PADS, Use as directed to check blood sugars 1 time per day dx: e11.22, Disp: 150 each, Rfl: 2   allopurinol (ZYLOPRIM) 100 MG tablet, Take 1 tablet (100 mg total) by mouth 3 (three) times a week for 90 doses. (Patient taking differently: Take 100 mg by mouth every Monday, Wednesday, and Friday.), Disp: 90 tablet, Rfl: 2   apixaban (ELIQUIS) 5 MG TABS tablet, Take 1 tablet (5 mg total) by mouth 2 (two) times daily., Disp: 180 tablet, Rfl: 1   Ascorbic Acid (VITAMIN C PO), Take 500 mg by mouth daily with lunch., Disp: , Rfl:    B Complex-C (B-COMPLEX WITH VITAMIN C) tablet, Take 1 tablet by mouth daily with lunch.,  Disp: , Rfl:    benzonatate (TESSALON PERLES) 100 MG capsule, Take 1 capsule (100 mg total) by mouth 3 (three) times daily as needed for cough., Disp: 30 capsule, Rfl: 0   budesonide-formoterol (SYMBICORT) 160-4.5 MCG/ACT inhaler, INHALE 2 PUFFS BY MOUTH TWICE DAILY IN THE MORNING AND IN THE EVENING (Patient taking differently: 2 puffs 2 (two) times daily as needed (respiratory issues.).), Disp: 10.2 g, Rfl: 6   Calcium Carb-Cholecalciferol 600-800 MG-UNIT TABS, Take 1 tablet by mouth daily with lunch., Disp: , Rfl:    Cholecalciferol (VITAMIN D PO), Take 2,000 Units by mouth  daily with lunch., Disp: , Rfl:    denosumab (PROLIA) 60 MG/ML SOSY injection, Inject 60 mg into the skin every 6 (six) months. Courier to rheum: [address], [address], [place] [address]. Appt on 12/07/2022, Disp: 1 mL, Rfl: 0   glucose blood (ACCU-CHEK GUIDE) test strip, USE TO CHECK BLOOD SUGAR DAILY, Disp: 100 strip, Rfl: 2   hydroxychloroquine (PLAQUENIL) 200 MG tablet, TAKE 1 TABLET BY MOUTH IN THE MORNING AND 1/2 TABLET IN THE EVENING MONDAY-FRIDAY ONLY, Disp: 90 tablet, Rfl: 0   irbesartan (AVAPRO) 300 MG tablet, TAKE 1 TABLET(300 MG) BY MOUTH DAILY, Disp: 90 tablet, Rfl: 2   Iron Sucrose (VENOFER IV), Inject 1 Dose into the vein as needed (per patient labs)., Disp: , Rfl:    JARDIANCE 10 MG TABS tablet, Take 10 mg by mouth in the morning., Disp: , Rfl:    metoprolol succinate (TOPROL-XL) 25 MG 24 hr tablet, TAKE 1 TABLET(25 MG) BY MOUTH DAILY, Disp: 90 tablet, Rfl: 3   Multiple Vitamin (MULTIVITAMIN WITH MINERALS) TABS tablet, Take 1 tablet by mouth daily with lunch. Women's Multivitamin, Disp: , Rfl:    Polyethyl Glycol-Propyl Glycol (SYSTANE) 0.4-0.3 % SOLN, Place 1-2 drops into both eyes 3 (three) times daily as needed (dry/irritated eyes.)., Disp: , Rfl:    SYNTHROID 88 MCG tablet, TAKE 1 TABLET BY MOUTH EVERY DAY MONDAY TO SATURDAY AND OFF ON SUNDAYS, Disp: 90 tablet, Rfl: 0   amLODipine (NORVASC) 5 MG tablet, Take 1 tablet (5 mg total) by mouth at bedtime., Disp: 90 tablet, Rfl: 2   atorvastatin (LIPITOR) 80 MG tablet, TAKE 1 TABLET BY MOUTH EVERY DAY, Disp: 90 tablet, Rfl: 2   methocarbamol (ROBAXIN) 500 MG tablet, Take 1 tablet (500 mg total) by mouth every 6 (six) hours as needed for muscle spasms., Disp: 40 tablet, Rfl: 0   ondansetron (ZOFRAN) 4 MG tablet, Take 1 tablet (4 mg total) by mouth every 6 (six) hours as needed for nausea., Disp: 20 tablet, Rfl: 0   oxyCODONE (OXY IR/ROXICODONE) 5 MG immediate release tablet, Take 1-2 tablets (5-10 mg total) by mouth every 4 (four)  hours as needed for severe pain (pain score 7-10)., Disp: 42 tablet, Rfl: 0   traMADol (ULTRAM) 50 MG tablet, Take 1-2 tablets (50-100 mg total) by mouth every 6 (six) hours as needed for moderate pain (pain score 4-6)., Disp: 40 tablet, Rfl: 0   Allergies  Allergen Reactions   Shellfish Allergy Anaphylaxis and Hives     Review of Systems  Constitutional: Negative.   HENT: Negative.    Eyes:  Negative for blurred vision.  Respiratory:  Positive for cough.        She has dry cough, non-productive. Occurs intermittently throughout the day. She is getting over COVID.   Cardiovascular: Negative.  Negative for chest pain.  Gastrointestinal: Negative.   Neurological: Negative.  Negative for headaches.  Psychiatric/Behavioral: Negative.       Today's Vitals   07/07/23 1041  BP: 122/78  Pulse: 68  Temp: 98.6 F (37 C)  SpO2: 98%  Weight: 170 lb 6.4 oz (77.3 kg)  Height: 4\' 10"  (1.473 m)   Body mass index is 35.61 kg/m.  Wt Readings from Last 3 Encounters:  07/12/23 170 lb 6.4 oz (77.3 kg)  07/07/23 170 lb 6.4 oz (77.3 kg)  07/05/23 170 lb (77.1 kg)     Objective:  Physical Exam Vitals and nursing note reviewed.  Constitutional:      Appearance: Normal appearance. She is obese.  HENT:     Head: Normocephalic and atraumatic.  Eyes:     Extraocular Movements: Extraocular movements intact.  Cardiovascular:     Rate and Rhythm: Normal rate and regular rhythm.     Heart sounds: Normal heart sounds.  Pulmonary:     Effort: Pulmonary effort is normal.     Breath sounds: Normal breath sounds.  Musculoskeletal:     Cervical back: Normal range of motion.  Skin:    General: Skin is warm.  Neurological:     General: No focal deficit present.     Mental Status: She is alert.  Psychiatric:        Mood and Affect: Mood normal.        Behavior: Behavior normal.         Assessment And Plan:  Hypertensive heart and renal disease with renal failure, stage 1 through stage 4 or  unspecified chronic kidney disease, without heart failure Assessment & Plan: Chronic, well controlled. She is reminded to follow low sodium diet. No med changes today.    Type 2 diabetes mellitus with stage 3a chronic kidney disease, without long-term current use of insulin (HCC) Assessment & Plan: Chronic, she had pre-op labs performed on 10/7  - results reviewed. A1c is 5.4. No med changes needed. She is currently on Jardiance 10mg  daily.    Pure hypercholesterolemia Assessment & Plan: Chronic, we discussed use of cardiac calcium scoring to further evaluate cardiac risk. She is aware of $99 fee.   Orders: -     CT CARDIAC SCORING (SELF PAY ONLY); Future  Acquired thrombophilia (HCC) Assessment & Plan: She is on Eliquis due to PAF.   Mild intermittent asthma without complication Assessment & Plan: Chronic, sx are stable.    Class 2 severe obesity due to excess calories with serious comorbidity and body mass index (BMI) of 35.0 to 35.9 in adult [facility]) Assessment & Plan: She is encouraged to strive for BMI less than 30 to decrease cardiac risk. Advised to aim for at least 150 minutes of exercise per week.    Personal history of COVID-19 Assessment & Plan: I will send rx benzonatate perles to use prn for lingering cough. Encouraged to avoid dairy.    Other orders -     Benzonatate; Take 1 capsule (100 mg total) by mouth 3 (three) times daily as needed for cough.  Dispense: 30 capsule; Refill: 0  She is encouraged to strive for BMI less than 30 to decrease cardiac risk. Advised to aim for at least 150 minutes of exercise per week.    Return in 20 days (on 07/27/2023), or flu shot-[place], for 4 month bp & dm .  Patient was given opportunity to ask questions. Patient verbalized understanding of the plan and was able to repeat key elements of the plan. All questions were answered to their satisfaction.  I, [name], MD, have reviewed all documentation for this visit. The  documentation on 07/07/23 for the exam, diagnosis, procedures, and orders are all accurate and complete.   IF YOU HAVE BEEN REFERRED TO A SPECIALIST, IT MAY TAKE 1-2 WEEKS TO SCHEDULE/PROCESS THE REFERRAL. IF YOU HAVE NOT HEARD FROM US/SPECIALIST IN TWO WEEKS, PLEASE GIVE [place] A CALL AT [phone].   THE PATIENT IS ENCOURAGED TO PRACTICE SOCIAL DISTANCING DUE TO THE COVID-19 PANDEMIC.

## 2023-07-07 NOTE — Patient Instructions (Signed)
Hypertension, Adult Hypertension is another name for high blood pressure. High blood pressure forces your heart to work harder to pump blood. This can cause problems over time. There are two numbers in a blood pressure reading. There is a top number (systolic) over a bottom number (diastolic). It is best to have a blood pressure that is below 120/80. What are the causes? The cause of this condition is not known. Some other conditions can lead to high blood pressure. What increases the risk? Some lifestyle factors can make you more likely to develop high blood pressure: Smoking. Not getting enough exercise or physical activity. Being overweight. Having too much fat, sugar, calories, or salt (sodium) in your diet. Drinking too much alcohol. Other risk factors include: Having any of these conditions: Heart disease. Diabetes. High cholesterol. Kidney disease. Obstructive sleep apnea. Having a family history of high blood pressure and high cholesterol. Age. The risk increases with age. Stress. What are the signs or symptoms? High blood pressure may not cause symptoms. Very high blood pressure (hypertensive crisis) may cause: Headache. Fast or uneven heartbeats (palpitations). Shortness of breath. Nosebleed. Vomiting or feeling like you may vomit (nauseous). Changes in how you see. Very bad chest pain. Feeling dizzy. Seizures. How is this treated? This condition is treated by making healthy lifestyle changes, such as: Eating healthy foods. Exercising more. Drinking less alcohol. Your doctor may prescribe medicine if lifestyle changes do not help enough and if: Your top number is above 130. Your bottom number is above 80. Your personal target blood pressure may vary. Follow these instructions at home: Eating and drinking  If told, follow the DASH eating plan. To follow this plan: Fill one half of your plate at each meal with fruits and vegetables. Fill one fourth of your plate  at each meal with whole grains. Whole grains include whole-wheat pasta, brown rice, and whole-grain bread. Eat or drink low-fat dairy products, such as skim milk or low-fat yogurt. Fill one fourth of your plate at each meal with low-fat (lean) proteins. Low-fat proteins include fish, chicken without skin, eggs, beans, and tofu. Avoid fatty meat, cured and processed meat, or chicken with skin. Avoid pre-made or processed food. Limit the amount of salt in your diet to less than 1,500 mg each day. Do not drink alcohol if: Your doctor tells you not to drink. You are pregnant, may be pregnant, or are planning to become pregnant. If you drink alcohol: Limit how much you have to: 0-1 drink a day for women. 0-2 drinks a day for men. Know how much alcohol is in your drink. In the [place] one drink equals one 12 oz bottle of beer (355 mL), one 5 oz glass of wine (148 mL), or one 1 oz glass of hard liquor (44 mL). Lifestyle  Work with your doctor to stay at a healthy weight or to lose weight. Ask your doctor what the best weight is for you. Get at least 30 minutes of exercise that causes your heart to beat faster (aerobic exercise) most days of the week. This may include walking, swimming, or biking. Get at least 30 minutes of exercise that strengthens your muscles (resistance exercise) at least 3 days a week. This may include lifting weights or doing Pilates. Do not smoke or use any products that contain nicotine or tobacco. If you need help quitting, ask your doctor. Check your blood pressure at home as told by your doctor. Keep all follow-up visits. Medicines Take over-the-counter and prescription medicines   only as told by your doctor. Follow directions carefully. Do not skip doses of blood pressure medicine. The medicine does not work as well if you skip doses. Skipping doses also puts you at risk for problems. Ask your doctor about side effects or reactions to medicines that you should watch  for. Contact a doctor if: You think you are having a reaction to the medicine you are taking. You have headaches that keep coming back. You feel dizzy. You have swelling in your ankles. You have trouble with your vision. Get help right away if: You get a very bad headache. You start to feel mixed up (confused). You feel weak or numb. You feel faint. You have very bad pain in your: Chest. Belly (abdomen). You vomit more than once. You have trouble breathing. These symptoms may be an emergency. Get help right away. Call 911. Do not wait to see if the symptoms will go away. Do not drive yourself to the hospital. Summary Hypertension is another name for high blood pressure. High blood pressure forces your heart to work harder to pump blood. For most people, a normal blood pressure is less than 120/80. Making healthy choices can help lower blood pressure. If your blood pressure does not get lower with healthy choices, you may need to take medicine. This information is not intended to replace advice given to you by your health care provider. Make sure you discuss any questions you have with your health care provider. Document Revised: 07/03/2021 Document Reviewed: 07/03/2021 Elsevier Patient Education  2024 Elsevier Inc.  

## 2023-07-08 ENCOUNTER — Other Ambulatory Visit (HOSPITAL_COMMUNITY): Payer: Self-pay

## 2023-07-08 ENCOUNTER — Ambulatory Visit: Payer: Self-pay

## 2023-07-09 NOTE — Patient Instructions (Signed)
Visit Information  Thank you for taking time to visit with me today. Please don't hesitate to contact me if I can be of assistance to you.   Following are the goals we discussed today:   Goals Addressed             This Visit's Progress    To undergo right total knee surgery   On track    Care Coordination Interventions: Evaluation of current treatment plan related to arthritis of right knee  and patient's adherence to plan as established by provider Confirmed with patient her upcoming scheduled right total knee replacement is set for 07/12/23 Determined patient's sister will be available to assist her following the surgery  Discussed with patient she has a good understanding of what to expect and is looking forward to having improved ROM and mobility  Discussed plans with patient for ongoing care coordination follow up and provided patient with direct contact information for nurse care coordinator        Our next appointment is by telephone on 08/09/23 at 09:30 AM  Please call the care guide team at [phone] if you need to cancel or reschedule your appointment.   If you are experiencing a Mental Health or Behavioral Health Crisis or need someone to talk to, please call 1-800-273-TALK (toll free, 24 hour hotline)  Patient verbalizes understanding of instructions and care plan provided today and agrees to view in MyChart. Active MyChart status and patient understanding of how to access instructions and care plan via MyChart confirmed with patient.     Delsa Sale RN BSN CCM [place]  [facility], [facility] Nurse Care Coordinator  Direct Dial: [phone] Website: [email or web address]

## 2023-07-09 NOTE — Patient Outreach (Signed)
Care Coordination   Follow Up Visit Note   07/08/2023 Name: Hannah [name] MRN: [id number] DOB: 07-02-1946  Hannah [name] is a 77 y.o. year old female who sees Dorothyann Peng, MD for primary care. I spoke with  Hannah [name] by phone today.  What matters to the patients health and wellness today?  Patient would like to undergo her knee replacement without any complications.     Goals Addressed             This Visit's Progress    To undergo right total knee surgery   On track    Care Coordination Interventions: Evaluation of current treatment plan related to arthritis of right knee  and patient's adherence to plan as established by provider Confirmed with patient her upcoming scheduled right total knee replacement is set for 07/12/23 Determined patient's sister will be available to assist her following the surgery  Discussed with patient she has a good understanding of what to expect and is looking forward to having improved ROM and mobility  Discussed plans with patient for ongoing care coordination follow up and provided patient with direct contact information for nurse care coordinator    Interventions Today    Flowsheet Row Most Recent Value  Chronic Disease   Chronic disease during today's visit Other  [impaired physical mobility]  General Interventions   General Interventions Discussed/Reviewed General Interventions Discussed, General Interventions Reviewed, Doctor Visits  Doctor Visits Discussed/Reviewed Doctor Visits Discussed, Doctor Visits Reviewed, Specialist  Exercise Interventions   Exercise Discussed/Reviewed Physical Activity  Physical Activity Discussed/Reviewed Physical Activity Reviewed, Physical Activity Discussed  Education Interventions   Education Provided Provided Education  Provided Verbal Education On When to see the doctor  Pharmacy Interventions   Pharmacy Dicussed/Reviewed Pharmacy Topics Reviewed, Pharmacy Topics Discussed           SDOH assessments and interventions completed:  No     Care Coordination Interventions:  Yes, provided   Follow up plan: Follow up call scheduled for 08/09/23 @09 :30 AM    Encounter Outcome:  Patient Visit Completed

## 2023-07-09 NOTE — Telephone Encounter (Signed)
Patient called yesterday to get clarification on what documents could count towards her 3%, she is encouraged to get iron infusion and prolia cost print out to include in application.

## 2023-07-09 NOTE — Telephone Encounter (Signed)
Paper Work Dropped Off: PT came in this morning  to drop off this paper work for a  med application   Date: 07/09/2023   Location of paper: In pink folder

## 2023-07-11 ENCOUNTER — Other Ambulatory Visit: Payer: Self-pay | Admitting: Internal Medicine

## 2023-07-12 ENCOUNTER — Ambulatory Visit (HOSPITAL_COMMUNITY): Payer: Medicare HMO | Admitting: Anesthesiology

## 2023-07-12 ENCOUNTER — Other Ambulatory Visit: Payer: Self-pay

## 2023-07-12 ENCOUNTER — Encounter (HOSPITAL_COMMUNITY)
Admission: RE | Disposition: A | Discharge status: Home / Self Care | Payer: Self-pay | Source: Home / Self Care | Attending: Orthopedic Surgery

## 2023-07-12 ENCOUNTER — Other Ambulatory Visit (HOSPITAL_COMMUNITY): Payer: Self-pay

## 2023-07-12 ENCOUNTER — Ambulatory Visit (HOSPITAL_COMMUNITY): Payer: Medicare HMO | Admitting: Physician Assistant

## 2023-07-12 ENCOUNTER — Observation Stay (HOSPITAL_COMMUNITY)
Admission: RE | Admit: 2023-07-12 | Discharge: 2023-07-13 | Disposition: A | Discharge status: Home / Self Care | Payer: Medicare HMO | Attending: Orthopedic Surgery | Admitting: Orthopedic Surgery

## 2023-07-12 ENCOUNTER — Encounter (HOSPITAL_COMMUNITY): Payer: Self-pay | Admitting: Orthopedic Surgery

## 2023-07-12 DIAGNOSIS — E1122 Type 2 diabetes mellitus with diabetic chronic kidney disease: Secondary | ICD-10-CM | POA: Insufficient documentation

## 2023-07-12 DIAGNOSIS — N183 Chronic kidney disease, stage 3 unspecified: Secondary | ICD-10-CM | POA: Diagnosis not present

## 2023-07-12 DIAGNOSIS — M1711 Unilateral primary osteoarthritis, right knee: Secondary | ICD-10-CM | POA: Diagnosis not present

## 2023-07-12 DIAGNOSIS — I129 Hypertensive chronic kidney disease with stage 1 through stage 4 chronic kidney disease, or unspecified chronic kidney disease: Secondary | ICD-10-CM | POA: Diagnosis not present

## 2023-07-12 DIAGNOSIS — Z96652 Presence of left artificial knee joint: Secondary | ICD-10-CM | POA: Insufficient documentation

## 2023-07-12 DIAGNOSIS — I4891 Unspecified atrial fibrillation: Secondary | ICD-10-CM | POA: Diagnosis not present

## 2023-07-12 DIAGNOSIS — J449 Chronic obstructive pulmonary disease, unspecified: Secondary | ICD-10-CM

## 2023-07-12 DIAGNOSIS — G8918 Other acute postprocedural pain: Secondary | ICD-10-CM | POA: Diagnosis not present

## 2023-07-12 DIAGNOSIS — Z79899 Other long term (current) drug therapy: Secondary | ICD-10-CM | POA: Insufficient documentation

## 2023-07-12 DIAGNOSIS — E039 Hypothyroidism, unspecified: Secondary | ICD-10-CM | POA: Diagnosis not present

## 2023-07-12 DIAGNOSIS — Z8616 Personal history of COVID-19: Secondary | ICD-10-CM | POA: Insufficient documentation

## 2023-07-12 DIAGNOSIS — N1831 Chronic kidney disease, stage 3a: Secondary | ICD-10-CM

## 2023-07-12 DIAGNOSIS — M179 Osteoarthritis of knee, unspecified: Principal | ICD-10-CM | POA: Diagnosis present

## 2023-07-12 DIAGNOSIS — J45909 Unspecified asthma, uncomplicated: Secondary | ICD-10-CM | POA: Insufficient documentation

## 2023-07-12 DIAGNOSIS — Z7901 Long term (current) use of anticoagulants: Secondary | ICD-10-CM | POA: Insufficient documentation

## 2023-07-12 HISTORY — PX: TOTAL KNEE ARTHROPLASTY: SHX125

## 2023-07-12 LAB — GLUCOSE, CAPILLARY
Glucose-Capillary: 132 mg/dL — ABNORMAL HIGH (ref 70–99)
Glucose-Capillary: 138 mg/dL — ABNORMAL HIGH (ref 70–99)

## 2023-07-12 SURGERY — ARTHROPLASTY, KNEE, TOTAL
Anesthesia: Spinal | Site: Knee | Laterality: Right

## 2023-07-12 MED ORDER — ORAL CARE MOUTH RINSE: 15.0000 mL | OROMUCOSAL | Status: DC | PRN

## 2023-07-12 MED ORDER — FLEET ENEMA RE ENEM: 1.0000 | ENEMA | Freq: Once | RECTAL | Status: DC | PRN

## 2023-07-12 MED ORDER — ALBUTEROL SULFATE (2.5 MG/3ML) 0.083% IN NEBU: 2.5000 mg | INHALATION_SOLUTION | Freq: Four times a day (QID) | RESPIRATORY_TRACT | Status: DC | PRN

## 2023-07-12 MED ORDER — METOCLOPRAMIDE HCL 5 MG PO TABS: 5.0000 mg | ORAL_TABLET | Freq: Three times a day (TID) | ORAL | Status: DC | PRN

## 2023-07-12 MED ORDER — PHENYLEPHRINE HCL-NACL 20-0.9 MG/250ML-% IV SOLN
INTRAVENOUS | Status: DC | PRN
Start: 2023-07-12 — End: 2023-07-12
  Administered 2023-07-12: 50 ug/min via INTRAVENOUS

## 2023-07-12 MED ORDER — CHLORHEXIDINE GLUCONATE 0.12 % MT SOLN
15.0000 mL | Freq: Once | OROMUCOSAL | Status: AC
  Administered 2023-07-12: 15 mL via OROMUCOSAL

## 2023-07-12 MED ORDER — MIDAZOLAM HCL 2 MG/2ML IJ SOLN: 0.5000 mg | Freq: Once | INTRAMUSCULAR | Status: DC | PRN

## 2023-07-12 MED ORDER — BUPIVACAINE IN DEXTROSE 0.75-8.25 % IT SOLN
INTRATHECAL | Status: DC | PRN
Start: 2023-07-12 — End: 2023-07-12
  Administered 2023-07-12: 12 mg via INTRATHECAL

## 2023-07-12 MED ORDER — BUPIVACAINE LIPOSOME 1.3 % IJ SUSP
INTRAMUSCULAR | Status: AC
  Filled 2023-07-12: qty 20

## 2023-07-12 MED ORDER — METHOCARBAMOL 500 MG IVPB - SIMPLE MED: 500.0000 mg | Freq: Four times a day (QID) | INTRAVENOUS | Status: DC | PRN

## 2023-07-12 MED ORDER — PHENYLEPHRINE HCL-NACL 20-0.9 MG/250ML-% IV SOLN
INTRAVENOUS | Status: AC
  Filled 2023-07-12: qty 250

## 2023-07-12 MED ORDER — OXYCODONE HCL 5 MG/5ML PO SOLN: 5.0000 mg | Freq: Once | ORAL | Status: DC | PRN

## 2023-07-12 MED ORDER — HYDROMORPHONE HCL 1 MG/ML IJ SOLN: 0.5000 mg | INTRAMUSCULAR | Status: DC | PRN

## 2023-07-12 MED ORDER — SODIUM CHLORIDE (PF) 0.9 % IJ SOLN
INTRAMUSCULAR | Status: AC
  Filled 2023-07-12: qty 50

## 2023-07-12 MED ORDER — METOPROLOL SUCCINATE ER 25 MG PO TB24
25.0000 mg | ORAL_TABLET | Freq: Every day | ORAL | Status: DC
  Administered 2023-07-13: 25 mg via ORAL
  Filled 2023-07-12: qty 1

## 2023-07-12 MED ORDER — SODIUM CHLORIDE 0.9 % IV SOLN: INTRAVENOUS | Status: AC

## 2023-07-12 MED ORDER — METHOCARBAMOL 500 MG PO TABS
500.0000 mg | ORAL_TABLET | Freq: Four times a day (QID) | ORAL | Status: DC | PRN
  Administered 2023-07-12 – 2023-07-13 (×2): 500 mg via ORAL
  Filled 2023-07-12 (×2): qty 1

## 2023-07-12 MED ORDER — METOCLOPRAMIDE HCL 5 MG/ML IJ SOLN: 5.0000 mg | Freq: Three times a day (TID) | INTRAMUSCULAR | Status: DC | PRN

## 2023-07-12 MED ORDER — POLYETHYLENE GLYCOL 3350 17 G PO PACK: 17.0000 g | PACK | Freq: Every day | ORAL | Status: DC | PRN

## 2023-07-12 MED ORDER — 0.9 % SODIUM CHLORIDE (POUR BTL) OPTIME
TOPICAL | Status: DC | PRN
  Administered 2023-07-12: 1000 mL

## 2023-07-12 MED ORDER — DIPHENHYDRAMINE HCL 12.5 MG/5ML PO ELIX: 12.5000 mg | ORAL_SOLUTION | ORAL | Status: DC | PRN

## 2023-07-12 MED ORDER — TRANEXAMIC ACID-NACL 1000-0.7 MG/100ML-% IV SOLN
1000.0000 mg | INTRAVENOUS | Status: AC
  Administered 2023-07-12: 1000 mg via INTRAVENOUS
  Filled 2023-07-12: qty 100

## 2023-07-12 MED ORDER — OXYCODONE HCL 5 MG PO TABS
5.0000 mg | ORAL_TABLET | ORAL | Status: DC | PRN
  Administered 2023-07-13 (×3): 5 mg via ORAL
  Filled 2023-07-12 (×3): qty 1

## 2023-07-12 MED ORDER — FENTANYL CITRATE PF 50 MCG/ML IJ SOSY
PREFILLED_SYRINGE | INTRAMUSCULAR | Status: AC
  Filled 2023-07-12: qty 2

## 2023-07-12 MED ORDER — ACETAMINOPHEN 500 MG PO TABS: 1000.0000 mg | ORAL_TABLET | Freq: Once | ORAL | Status: DC

## 2023-07-12 MED ORDER — MENTHOL 3 MG MT LOZG: 1.0000 | LOZENGE | OROMUCOSAL | Status: DC | PRN

## 2023-07-12 MED ORDER — INSULIN ASPART 100 UNIT/ML IJ SOLN
0.0000 [IU] | Freq: Three times a day (TID) | INTRAMUSCULAR | Status: DC
  Administered 2023-07-12 – 2023-07-13 (×3): 2 [IU] via SUBCUTANEOUS

## 2023-07-12 MED ORDER — ATORVASTATIN CALCIUM 40 MG PO TABS: 80.0000 mg | ORAL_TABLET | Freq: Every day | ORAL | Status: DC

## 2023-07-12 MED ORDER — TRAMADOL HCL 50 MG PO TABS
50.0000 mg | ORAL_TABLET | Freq: Four times a day (QID) | ORAL | Status: DC | PRN
  Administered 2023-07-12 – 2023-07-13 (×2): 100 mg via ORAL
  Filled 2023-07-12 (×2): qty 2

## 2023-07-12 MED ORDER — APIXABAN 2.5 MG PO TABS
2.5000 mg | ORAL_TABLET | Freq: Two times a day (BID) | ORAL | Status: DC
  Administered 2023-07-13: 2.5 mg via ORAL
  Filled 2023-07-12: qty 1

## 2023-07-12 MED ORDER — FENTANYL CITRATE PF 50 MCG/ML IJ SOSY
100.0000 ug | PREFILLED_SYRINGE | INTRAMUSCULAR | Status: AC
  Administered 2023-07-12: 100 ug via INTRAVENOUS

## 2023-07-12 MED ORDER — IRBESARTAN 150 MG PO TABS
300.0000 mg | ORAL_TABLET | Freq: Every day | ORAL | Status: DC
  Administered 2023-07-13: 300 mg via ORAL
  Filled 2023-07-12: qty 2

## 2023-07-12 MED ORDER — ROPIVACAINE HCL 7.5 MG/ML IJ SOLN
INTRAMUSCULAR | Status: DC | PRN
Start: 2023-07-12 — End: 2023-07-12
  Administered 2023-07-12: 20 mL via PERINEURAL

## 2023-07-12 MED ORDER — AMLODIPINE BESYLATE 5 MG PO TABS: 5.0000 mg | ORAL_TABLET | Freq: Every day | ORAL | Status: DC

## 2023-07-12 MED ORDER — DEXAMETHASONE SODIUM PHOSPHATE 10 MG/ML IJ SOLN
8.0000 mg | Freq: Once | INTRAMUSCULAR | Status: AC
  Administered 2023-07-12: 8 mg via INTRAVENOUS

## 2023-07-12 MED ORDER — HYDROMORPHONE HCL 1 MG/ML IJ SOLN: 0.2500 mg | INTRAMUSCULAR | Status: DC | PRN

## 2023-07-12 MED ORDER — FENTANYL CITRATE (PF) 100 MCG/2ML IJ SOLN
INTRAMUSCULAR | Status: AC
  Filled 2023-07-12: qty 2

## 2023-07-12 MED ORDER — CEFAZOLIN SODIUM-DEXTROSE 2-4 GM/100ML-% IV SOLN
2.0000 g | Freq: Four times a day (QID) | INTRAVENOUS | Status: AC
  Administered 2023-07-12 (×2): 2 g via INTRAVENOUS
  Filled 2023-07-12 (×2): qty 100

## 2023-07-12 MED ORDER — LACTATED RINGERS IV SOLN: INTRAVENOUS | Status: DC

## 2023-07-12 MED ORDER — GLYCOPYRROLATE 0.2 MG/ML IJ SOLN
INTRAMUSCULAR | Status: DC | PRN
Start: 2023-07-12 — End: 2023-07-12
  Administered 2023-07-12 (×2): .1 mg via INTRAVENOUS

## 2023-07-12 MED ORDER — PHENOL 1.4 % MT LIQD: 1.0000 | OROMUCOSAL | Status: DC | PRN

## 2023-07-12 MED ORDER — SODIUM CHLORIDE (PF) 0.9 % IJ SOLN
INTRAMUSCULAR | Status: DC | PRN
  Administered 2023-07-12: 80 mL

## 2023-07-12 MED ORDER — INSULIN ASPART 100 UNIT/ML IJ SOLN: 0.0000 [IU] | Freq: Every day | INTRAMUSCULAR | Status: DC

## 2023-07-12 MED ORDER — BUPIVACAINE LIPOSOME 1.3 % IJ SUSP: 20.0000 mL | Freq: Once | INTRAMUSCULAR | Status: DC

## 2023-07-12 MED ORDER — ORAL CARE MOUTH RINSE: 15.0000 mL | Freq: Once | OROMUCOSAL | Status: AC

## 2023-07-12 MED ORDER — ACETAMINOPHEN 500 MG PO TABS
1000.0000 mg | ORAL_TABLET | Freq: Four times a day (QID) | ORAL | Status: AC
  Administered 2023-07-12 – 2023-07-13 (×4): 1000 mg via ORAL
  Filled 2023-07-12 (×4): qty 2

## 2023-07-12 MED ORDER — PROPOFOL 500 MG/50ML IV EMUL
INTRAVENOUS | Status: DC | PRN
  Administered 2023-07-12: 100 ug/kg/min via INTRAVENOUS

## 2023-07-12 MED ORDER — SODIUM CHLORIDE 0.9 % IR SOLN
Status: DC | PRN
  Administered 2023-07-12: 1000 mL

## 2023-07-12 MED ORDER — ACETAMINOPHEN 10 MG/ML IV SOLN
1000.0000 mg | Freq: Four times a day (QID) | INTRAVENOUS | Status: DC
  Administered 2023-07-12: 1000 mg via INTRAVENOUS
  Filled 2023-07-12: qty 100

## 2023-07-12 MED ORDER — ONDANSETRON HCL 4 MG PO TABS: 4.0000 mg | ORAL_TABLET | Freq: Four times a day (QID) | ORAL | Status: DC | PRN

## 2023-07-12 MED ORDER — OXYCODONE HCL 5 MG PO TABS: 5.0000 mg | ORAL_TABLET | Freq: Once | ORAL | Status: DC | PRN

## 2023-07-12 MED ORDER — LEVOTHYROXINE SODIUM 88 MCG PO TABS
88.0000 ug | ORAL_TABLET | Freq: Every day | ORAL | Status: DC
  Administered 2023-07-13: 88 ug via ORAL
  Filled 2023-07-12: qty 1

## 2023-07-12 MED ORDER — FENTANYL CITRATE (PF) 100 MCG/2ML IJ SOLN
INTRAMUSCULAR | Status: DC | PRN
  Administered 2023-07-12 (×3): 25 ug via INTRAVENOUS

## 2023-07-12 MED ORDER — EMPAGLIFLOZIN 10 MG PO TABS
10.0000 mg | ORAL_TABLET | Freq: Every day | ORAL | Status: DC
  Administered 2023-07-13: 10 mg via ORAL
  Filled 2023-07-12: qty 1

## 2023-07-12 MED ORDER — CEFAZOLIN SODIUM-DEXTROSE 2-4 GM/100ML-% IV SOLN
2.0000 g | INTRAVENOUS | Status: AC
  Administered 2023-07-12: 2 g via INTRAVENOUS
  Filled 2023-07-12: qty 100

## 2023-07-12 MED ORDER — BISACODYL 10 MG RE SUPP: 10.0000 mg | Freq: Every day | RECTAL | Status: DC | PRN

## 2023-07-12 MED ORDER — ALLOPURINOL 100 MG PO TABS: 100.0000 mg | ORAL_TABLET | ORAL | Status: DC

## 2023-07-12 MED ORDER — SODIUM CHLORIDE (PF) 0.9 % IJ SOLN
INTRAMUSCULAR | Status: AC
  Filled 2023-07-12: qty 10

## 2023-07-12 MED ORDER — DOCUSATE SODIUM 100 MG PO CAPS
100.0000 mg | ORAL_CAPSULE | Freq: Two times a day (BID) | ORAL | Status: DC
  Administered 2023-07-12 – 2023-07-13 (×2): 100 mg via ORAL
  Filled 2023-07-12 (×2): qty 1

## 2023-07-12 MED ORDER — POVIDONE-IODINE 10 % EX SWAB: 2.0000 | Freq: Once | CUTANEOUS | Status: DC

## 2023-07-12 MED ORDER — MOMETASONE FURO-FORMOTEROL FUM 200-5 MCG/ACT IN AERO
2.0000 | INHALATION_SPRAY | Freq: Two times a day (BID) | RESPIRATORY_TRACT | Status: DC
  Administered 2023-07-12 – 2023-07-13 (×2): 2 via RESPIRATORY_TRACT
  Filled 2023-07-12: qty 8.8

## 2023-07-12 MED ORDER — ONDANSETRON HCL 4 MG/2ML IJ SOLN: 4.0000 mg | Freq: Four times a day (QID) | INTRAMUSCULAR | Status: DC | PRN

## 2023-07-12 MED ORDER — STERILE WATER FOR IRRIGATION IR SOLN
Status: DC | PRN
  Administered 2023-07-12: 1000 mL

## 2023-07-12 MED ORDER — MUPIROCIN 2 % EX OINT
1.0000 | TOPICAL_OINTMENT | Freq: Two times a day (BID) | CUTANEOUS | Status: DC
  Administered 2023-07-12: 1 via TOPICAL
  Filled 2023-07-12: qty 22

## 2023-07-12 SURGICAL SUPPLY — 55 items
ADH SKN CLS APL DERMABOND .7 (GAUZE/BANDAGES/DRESSINGS) ×1
ATTUNE PS FEM RT SZ 4 CEM KNEE (Femur) IMPLANT
ATTUNE PSRP INSR SZ4 8 KNEE (Insert) IMPLANT
BAG COUNTER SPONGE SURGICOUNT (BAG) IMPLANT
BAG SPEC THK2 15X12 ZIP CLS (MISCELLANEOUS) ×1
BAG SPNG CNTER NS LX DISP (BAG)
BAG ZIPLOCK 12X15 (MISCELLANEOUS) ×1 IMPLANT
BASEPLATE TIBIAL ROTATING SZ 4 (Knees) IMPLANT
BLADE SAG 18X100X1.27 (BLADE) ×1 IMPLANT
BLADE SAW SGTL 11.0X1.19X90.0M (BLADE) ×1 IMPLANT
BNDG CMPR 6 X 5 YARDS HK CLSR (GAUZE/BANDAGES/DRESSINGS) ×1
BNDG ELASTIC 6INX 5YD STR LF (GAUZE/BANDAGES/DRESSINGS) ×1 IMPLANT
BOWL SMART MIX CTS (DISPOSABLE) ×1 IMPLANT
BSPLAT TIB 4 CMNT ROT PLAT STR (Knees) ×1 IMPLANT
CEMENT HV SMART SET (Cement) ×2 IMPLANT
COVER SURGICAL LIGHT HANDLE (MISCELLANEOUS) ×1 IMPLANT
CUFF TOURN SGL QUICK 34 (TOURNIQUET CUFF) ×1
CUFF TRNQT CYL 34X4.125X (TOURNIQUET CUFF) ×1 IMPLANT
DERMABOND ADVANCED .7 DNX12 (GAUZE/BANDAGES/DRESSINGS) ×1 IMPLANT
DRAPE U-SHAPE 47X51 STRL (DRAPES) ×1 IMPLANT
DRESSING AQUACEL AG SP 3.5X10 (GAUZE/BANDAGES/DRESSINGS) IMPLANT
DRSG AQUACEL AG ADV 3.5X10 (GAUZE/BANDAGES/DRESSINGS) ×1 IMPLANT
DRSG AQUACEL AG SP 3.5X10 (GAUZE/BANDAGES/DRESSINGS) ×1
DURAPREP 26ML APPLICATOR (WOUND CARE) ×1 IMPLANT
ELECT REM PT RETURN 15FT ADLT (MISCELLANEOUS) ×1 IMPLANT
GLOVE BIO SURGEON STRL SZ 6.5 (GLOVE) IMPLANT
GLOVE BIO SURGEON STRL SZ8 (GLOVE) ×1 IMPLANT
GLOVE BIOGEL PI IND STRL 6.5 (GLOVE) IMPLANT
GLOVE BIOGEL PI IND STRL 7.0 (GLOVE) IMPLANT
GLOVE BIOGEL PI IND STRL 8 (GLOVE) ×1 IMPLANT
GOWN STRL REUS W/ TWL LRG LVL3 (GOWN DISPOSABLE) ×1 IMPLANT
GOWN STRL REUS W/TWL LRG LVL3 (GOWN DISPOSABLE) ×1
HANDPIECE INTERPULSE COAX TIP (DISPOSABLE) ×1
HOLDER FOLEY CATH W/STRAP (MISCELLANEOUS) IMPLANT
IMMOBILIZER KNEE 20 (SOFTGOODS) ×1
IMMOBILIZER KNEE 20 THIGH 36 (SOFTGOODS) ×1 IMPLANT
KIT TURNOVER KIT A (KITS) IMPLANT
MANIFOLD NEPTUNE II (INSTRUMENTS) ×1 IMPLANT
NS IRRIG 1000ML POUR BTL (IV SOLUTION) ×1 IMPLANT
PACK TOTAL KNEE CUSTOM (KITS) ×1 IMPLANT
PADDING CAST ABS COTTON 6X4 NS (CAST SUPPLIES) IMPLANT
PADDING CAST COTTON 6X4 STRL (CAST SUPPLIES) ×2 IMPLANT
PATELLA MEDIAL ATTUN 35MM KNEE (Knees) IMPLANT
PIN STEINMAN FIXATION KNEE (PIN) IMPLANT
PROTECTOR NERVE ULNAR (MISCELLANEOUS) ×1 IMPLANT
SET HNDPC FAN SPRY TIP SCT (DISPOSABLE) ×1 IMPLANT
SUT MNCRL AB 4-0 PS2 18 (SUTURE) ×1 IMPLANT
SUT STRATAFIX 0 PDS 27 VIOLET (SUTURE) ×1
SUT VIC AB 2-0 CT1 27 (SUTURE) ×3
SUT VIC AB 2-0 CT1 TAPERPNT 27 (SUTURE) ×3 IMPLANT
SUTURE STRATFX 0 PDS 27 VIOLET (SUTURE) ×1 IMPLANT
TRAY FOLEY MTR SLVR 16FR STAT (SET/KITS/TRAYS/PACK) IMPLANT
TUBE SUCTION HIGH CAP CLEAR NV (SUCTIONS) ×1 IMPLANT
WATER STERILE IRR 1000ML POUR (IV SOLUTION) ×2 IMPLANT
WRAP KNEE MAXI GEL POST OP (GAUZE/BANDAGES/DRESSINGS) ×1 IMPLANT

## 2023-07-12 NOTE — Transfer of Care (Signed)
Immediate Anesthesia Transfer of Care Note  Patient: ROMONIA YANIK  Procedure(s) Performed: RIGHT TOTAL KNEE ARTHROPLASTY (Right: Knee)  Patient Location: PACU  Anesthesia Type:Spinal  Level of Consciousness: sedated  Airway & Oxygen Therapy: Patient Spontanous Breathing and Patient connected to face mask oxygen  Post-op Assessment: Report given to RN and Post -op Vital signs reviewed and stable  Post vital signs: stable  Last Vitals:  Vitals Value Taken Time  BP 88/50 07/12/23 1149  Temp    Pulse 59 07/12/23 1151  Resp 29 07/12/23 1151  SpO2 100 % 07/12/23 1151  Vitals shown include unfiled device data.  Last Pain:  Vitals:   07/12/23 0834  TempSrc:   PainSc: 0-No pain      Patients Stated Pain Goal: 3 (07/12/23 0834)  Complications: No notable events documented.

## 2023-07-12 NOTE — Interval H&P Note (Signed)
History and Physical Interval Note:  07/12/2023 8:34 AM  Hannah [name]  has presented today for surgery, with the diagnosis of right knee osteoarthritis.  The various methods of treatment have been discussed with the patient and family. After consideration of risks, benefits and other options for treatment, the patient has consented to  Procedure(s): TOTAL KNEE ARTHROPLASTY (Right) as a surgical intervention.  The patient's history has been reviewed, patient examined, no change in status, stable for surgery.  I have reviewed the patient's chart and labs.  Questions were answered to the patient's satisfaction.     Homero Fellers William Schake

## 2023-07-12 NOTE — Anesthesia Postprocedure Evaluation (Addendum)
Anesthesia Post Note  Patient: Hannah [name]  Procedure(s) Performed: RIGHT TOTAL KNEE ARTHROPLASTY (Right: Knee)     Patient location during evaluation: PACU Anesthesia Type: Spinal Level of consciousness: awake and alert, patient cooperative and oriented Pain management: pain level controlled Vital Signs Assessment: vitals unstable and post-procedure vital signs reviewed and stable Respiratory status: spontaneous breathing, nonlabored ventilation and respiratory function stable Cardiovascular status: stable and blood pressure returned to baseline Postop Assessment: no apparent nausea or vomiting and spinal receding Anesthetic complications: no   No notable events documented.  Last Vitals:  Vitals:   07/12/23 1345 07/12/23 1411  BP: (!) 155/72 138/77  Pulse: (!) 50 (!) 52  Resp: 12   Temp: (!) 36.3 C (!) 35.8 C  SpO2: 99% 97%    Last Pain:  Vitals:   07/12/23 1411  TempSrc: Axillary  PainSc: 0-No pain                 Sharolyn Weber,E. Izaias Krupka

## 2023-07-12 NOTE — Evaluation (Signed)
Physical Therapy Evaluation Patient Details Name: Hannah [name] MRN: [id number] DOB: [birth year] Today's Date: 07/12/2023  History of Present Illness  77 yo female s/p R TKA on 10/147/24; PMH: L TKA, PVCs, covid, CKD, lupus  Clinical Impression  Pt is s/p TKA resulting in the deficits listed below (see PT Problem List).  Pt with episode of R knee buckling with initial STS trial despite ind SLR in supine;KI applied in sitting, cues to offload RLE with UEs/use of RW.  Pt was able to progress to amb ~ 25' and min assist, no  significant knee buckling noted.    Pt will benefit from acute skilled PT to increase their independence and safety with mobility to allow discharge.          If plan is discharge home, recommend the following: A little help with walking and/or transfers;A little help with bathing/dressing/bathroom;Help with stairs or ramp for entrance;Assistance with cooking/housework;Assist for transportation   Can travel by private vehicle        Equipment Recommendations Rolling walker (2 wheels) (youth ht)  Recommendations for Other Services       Functional Status Assessment Patient has had a recent decline in their functional status and demonstrates the ability to make significant improvements in function in a reasonable and predictable amount of time.     Precautions / Restrictions Precautions Precautions: Fall;Knee Restrictions Weight Bearing Restrictions: No Other Position/Activity Restrictions: WBAT      Mobility  Bed Mobility Overal bed mobility: Needs Assistance Bed Mobility: Supine to Sit     Supine to sit: Contact guard     General bed mobility comments: for lines and safety    Transfers Overall transfer level: Needs assistance Equipment used: Rolling walker (2 wheels) Transfers: Sit to/from Stand Sit to Stand: Min assist, Mod assist, +2 safety/equipment           General transfer comment: STS x2, pt with R knee buckling on initial  standing despite beign able to SLR in supine without assist; cues for hand placement and RLE position; KI applied prior to second STS trial    Ambulation/Gait Ambulation/Gait assistance: Min assist Gait Distance (Feet): 25 Feet Assistive device: Rolling walker (2 wheels) Gait Pattern/deviations: Step-to pattern       General Gait Details: cues for sequence, step pattern for safety and RW distance from self  Stairs            Wheelchair Mobility     Tilt Bed    Modified Rankin (Stroke Patients Only)       Balance Overall balance assessment: Needs assistance Sitting-balance support: Feet supported, No upper extremity supported Sitting balance-Leahy Scale: Good     Standing balance support: During functional activity, Reliant on assistive device for balance Standing balance-Leahy Scale: Poor                               Pertinent Vitals/Pain Pain Assessment Pain Assessment: 0-10 Pain Score: 0-No pain Pain Location: right knee Pain Intervention(s): Monitored during session    Home Living Family/patient expects to be discharged to:: Private residence Living Arrangements: Alone Available Help at Discharge: Family (sister) Type of Home: Apartment Home Access: Level entry       Home Layout: One level Home Equipment: Cane - single point Additional Comments: amb with cane    Prior Function Prior Level of Function : Independent/Modified Independent  Mobility Comments: amb with cane       Extremity/Trunk Assessment   Upper Extremity Assessment Upper Extremity Assessment: Overall WFL for tasks assessed    Lower Extremity Assessment Lower Extremity Assessment: RLE deficits/detail RLE Deficits / Details: ankle WFL, able to SLR iwth no quad lag       Communication      Cognition Arousal: Alert Behavior During Therapy: WFL for tasks assessed/performed Overall Cognitive Status: Within Functional Limits for tasks assessed                                           General Comments      Exercises Total Joint Exercises Ankle Circles/Pumps: AROM, Both, 10 reps Quad Sets: AROM, Right, 5 reps   Assessment/Plan    PT Assessment Patient needs continued PT services  PT Problem List Decreased strength;Decreased range of motion;Decreased activity tolerance;Decreased balance;Decreased mobility;Decreased knowledge of precautions;Decreased knowledge of use of DME       PT Treatment Interventions DME instruction;Gait training;Stair training;Functional mobility training;Therapeutic activities;Therapeutic exercise;Patient/family education    PT Goals (Current goals can be found in the Care Plan section)  Acute Rehab PT Goals PT Goal Formulation: With patient Time For Goal Achievement: 07/19/23 Potential to Achieve Goals: Good    Frequency 7X/week     Co-evaluation               AM-PAC PT "6 Clicks" Mobility  Outcome Measure Help needed turning from your back to your side while in a flat bed without using bedrails?: A Little Help needed moving from lying on your back to sitting on the side of a flat bed without using bedrails?: A Little Help needed moving to and from a bed to a chair (including a wheelchair)?: A Lot Help needed standing up from a chair using your arms (e.g., wheelchair or bedside chair)?: A Little Help needed to walk in hospital room?: A Little Help needed climbing 3-5 steps with a railing? : Total 6 Click Score: 15    End of Session Equipment Utilized During Treatment: Gait belt;Right knee immobilizer Activity Tolerance: Patient tolerated treatment well Patient left: in chair;with call bell/phone within reach;with chair alarm set;with family/visitor present Nurse Communication: Mobility status PT Visit Diagnosis: Other abnormalities of gait and mobility (R26.89);Difficulty in walking, not elsewhere classified (R26.2)    Time: 5366-4403 PT Time Calculation (min)  (ACUTE ONLY): 26 min   Charges:   PT Evaluation $PT Eval Low Complexity: 1 Low PT Treatments $Gait Training: 8-22 mins PT General Charges $$ ACUTE PT VISIT: 1 Visit         Henryetta Corriveau, PT  Acute Rehab Dept [facility]) [phone]  07/12/2023   [facility] 07/12/2023, 4:57 PM

## 2023-07-12 NOTE — Progress Notes (Signed)
Orthopedic Tech Progress Note Patient Details:  Hannah [name] 12-Oct-1945 [id number]  CPM Right Knee CPM Right Knee: Off Right Knee Flexion (Degrees): 40 Right Knee Extension (Degrees): 10  Post Interventions Patient Tolerated: Fair  [name] 07/12/2023, 3:56 PM

## 2023-07-12 NOTE — Anesthesia Preprocedure Evaluation (Addendum)
Anesthesia Evaluation  Patient identified by MRN, date of birth, ID band Patient awake    Reviewed: Allergy & Precautions, NPO status , Patient's Chart, lab work & pertinent test results, reviewed documented beta blocker date and time   History of Anesthesia Complications Negative for: history of anesthetic complications  Airway Mallampati: I  TM Distance: >3 FB Neck ROM: Full    Dental  (+) Caps, Dental Advisory Given   Pulmonary asthma , COPD,  COPD inhaler   breath sounds clear to auscultation       Cardiovascular hypertension, Pt. on medications and Pt. on home beta blockers + dysrhythmias Atrial Fibrillation + Valvular Problems/Murmurs (trivial) MR  Rhythm:Irregular Rate:Normal + Systolic murmurs '22 ECHO: EF 21-30%.  The left ventricle has normal function, no regional wall motion abnormalities. The left ventricular internal cavity size was normal in size. There is mild asymmetric LVH of the basal-septal segment. Grade I diastolic dysfunction (impaired relaxation).  Right Ventricle: The right ventricular size is normal. No increase in right ventricular wall thickness. RVF is normal. There is normal pulmonary artery systolic pressure.  Left Atrium: Left atrial size was normal in size.  Right Atrium: Right atrial size was normal in size.  Pericardium: Trivial pericardial effusion is present.  Mitral Valve: The mitral valve is grossly normal. Trivial MR. No evidence of mitral valve stenosis.  Tricuspid Valve: The tricuspid valve is grossly normal. Tricuspid valve regurgitation is trivial. No evidence of tricuspid stenosis.  Aortic Valve: The aortic valve is tricuspid. There is mild calcification of the aortic valve. Aortic valve regurgitation is not visualized. Mild aortic valve sclerosis is present, with no evidence of aortic valve stenosis.  Pulmonic Valve: The pulmonic valve was grossly normal. Pulmonic valve  regurgitation is  trivial. No evidence of pulmonic stenosis.      Neuro/Psych negative neurological ROS     GI/Hepatic   Endo/Other  diabetes, Oral Hypoglycemic AgentsHypothyroidism  SLE BMI 35.6  Renal/GU Renal InsufficiencyRenal disease     Musculoskeletal  (+) Arthritis ,    Abdominal   Peds  Hematology  (+) Blood dyscrasia, anemia Eliquis: last dose Thursday  Hb 10.3, plt 182k   Anesthesia Other Findings   Reproductive/Obstetrics                             Anesthesia Physical Anesthesia Plan  ASA: 3  Anesthesia Plan: Spinal   Post-op Pain Management: Regional block* and Tylenol PO (pre-op)*   Induction:   PONV Risk Score and Plan: 2 and Treatment may vary due to age or medical condition and Ondansetron  Airway Management Planned: Natural Airway and Simple Face Mask  Additional Equipment: None  Intra-op Plan:   Post-operative Plan:   Informed Consent: I have reviewed the patients History and Physical, chart, labs and discussed the procedure including the risks, benefits and alternatives for the proposed anesthesia with the patient or authorized representative who has indicated his/her understanding and acceptance.     Dental advisory given  Plan Discussed with: CRNA and Surgeon  Anesthesia Plan Comments: (Plan routine monitors, SAB with adductor canal block for post op analgesia)        Anesthesia Quick Evaluation

## 2023-07-12 NOTE — Anesthesia Procedure Notes (Signed)
Spinal  Patient location during procedure: OR End time: 07/12/2023 10:34 AM Reason for block: surgical anesthesia Staffing Performed: anesthesiologist  Anesthesiologist: Jairo Ben, MD Performed by: Jairo Ben, MD Authorized by: Jairo Ben, MD   Preanesthetic Checklist Completed: patient identified, IV checked, site marked, risks and benefits discussed, surgical consent, monitors and equipment checked, pre-op evaluation and timeout performed Spinal Block Patient position: sitting Prep: DuraPrep Patient monitoring: heart rate, cardiac monitor, continuous pulse ox and blood pressure Approach: midline Location: L3-4 Injection technique: single-shot Needle Needle type: Quincke  Needle gauge: 22 G Needle length: 9 cm Assessment Sensory level: T4 Events: CSF return Additional Notes Pt identified in Operating room.  Monitors applied. Working IV access confirmed. Sterile prep, drape lumbar spine.  1% lido local L 3,4.  #24ga Pencan all attempts os, so #22ga Quincke into clear CSF L 3,4.  12mg  0.75% Bupivacaine with dextrose injected with asp CSF beginning and end of injection.  Patient asymptomatic, VSS, no heme aspirated, tolerated well.  Sandford Craze, MD

## 2023-07-12 NOTE — Telephone Encounter (Signed)
Scanned pt's documents (tax & prescription profile) to [facility], [facility] email. FYI

## 2023-07-12 NOTE — Discharge Instructions (Signed)
Ollen Gross, MD Total Joint Specialist [facility] [place] [address] [place], [place] [address] [phone]  TOTAL KNEE REPLACEMENT POSTOPERATIVE DIRECTIONS    Knee Rehabilitation, Guidelines Following Surgery  Results after knee surgery are often greatly improved when you follow the exercise, range of motion and muscle strengthening exercises prescribed by your doctor. Safety measures are also important to protect the knee from further injury. If any of these exercises cause you to have increased pain or swelling in your knee joint, decrease the amount until you are comfortable again and slowly increase them. If you have problems or questions, call your caregiver or physical therapist for advice.   HOME CARE INSTRUCTIONS  Remove items at home which could result in a fall. This includes throw rugs or furniture in walking pathways.  ICE to the affected knee as much as tolerated. Icing helps control swelling. If the swelling is well controlled you will be more comfortable and rehab easier. Continue to use ice on the knee for pain and swelling from surgery. You may notice swelling that will progress down to the foot and ankle. This is normal after surgery. Elevate the leg when you are not up walking on it.    Continue to use the breathing machine which will help keep your temperature down. It is common for your temperature to cycle up and down following surgery, especially at night when you are not up moving around and exerting yourself. The breathing machine keeps your lungs expanded and your temperature down. Do not place pillow under the operative knee, focus on keeping the knee straight while resting  DIET You may resume your previous home diet once you are discharged from the hospital.  DRESSING / WOUND CARE / SHOWERING Keep your bulky bandage on for 2 days. On the third post-operative day you may remove the Ace bandage and gauze. There is a waterproof  adhesive bandage on your skin which will stay in place until your first follow-up appointment. Once you remove this you will not need to place another bandage You may begin showering 3 days following surgery, but do not submerge the incision under water.  ACTIVITY For the first 5 days, the key is rest and control of pain and swelling Do your home exercises twice a day starting on post-operative day 3. On the days you go to physical therapy, just do the home exercises once that day. You should rest, ice and elevate the leg for 50 minutes out of every hour. Get up and walk/stretch for 10 minutes per hour. After 5 days you can increase your activity slowly as tolerated. Walk with your walker as instructed. Use the walker until you are comfortable transitioning to a cane. Walk with the cane in the opposite hand of the operative leg. You may discontinue the cane once you are comfortable and walking steadily. Avoid periods of inactivity such as sitting longer than an hour when not asleep. This helps prevent blood clots.  You may discontinue the knee immobilizer once you are able to perform a straight leg raise while lying down. You may resume a sexual relationship in one month or when given the OK by your doctor.  You may return to work once you are cleared by your doctor.  Do not drive a car for 6 weeks or until released by your surgeon.  Do not drive while taking narcotics.  TED HOSE STOCKINGS Wear the elastic stockings on both legs for three weeks following surgery during the  day. You may remove them at night for sleeping.  WEIGHT BEARING Weight bearing as tolerated with assist device (walker, cane, etc) as directed, use it as long as suggested by your surgeon or therapist, typically at least 4-6 weeks.  POSTOPERATIVE CONSTIPATION PROTOCOL Constipation - defined medically as fewer than three stools per week and severe constipation as less than one stool per week.  One of the most common issues  patients have following surgery is constipation.  Even if you have a regular bowel pattern at home, your normal regimen is likely to be disrupted due to multiple reasons following surgery.  Combination of anesthesia, postoperative narcotics, change in appetite and fluid intake all can affect your bowels.  In order to avoid complications following surgery, here are some recommendations in order to help you during your recovery period.  Colace (docusate) - Pick up an over-the-counter form of Colace or another stool softener and take twice a day as long as you are requiring postoperative pain medications.  Take with a full glass of water daily.  If you experience loose stools or diarrhea, hold the colace until you stool forms back up. If your symptoms do not get better within 1 week or if they get worse, check with your doctor. Dulcolax (bisacodyl) - Pick up over-the-counter and take as directed by the product packaging as needed to assist with the movement of your bowels.  Take with a full glass of water.  Use this product as needed if not relieved by Colace only.  MiraLax (polyethylene glycol) - Pick up over-the-counter to have on hand. MiraLax is a solution that will increase the amount of water in your bowels to assist with bowel movements.  Take as directed and can mix with a glass of water, juice, soda, coffee, or tea. Take if you go more than two days without a movement. Do not use MiraLax more than once per day. Call your doctor if you are still constipated or irregular after using this medication for 7 days in a row.  If you continue to have problems with postoperative constipation, please contact the office for further assistance and recommendations.  If you experience "the worst abdominal pain ever" or develop nausea or vomiting, please contact the office immediatly for further recommendations for treatment.  ITCHING If you experience itching with your medications, try taking only a single pain  pill, or even half a pain pill at a time.  You can also use Benadryl over the counter for itching or also to help with sleep.   MEDICATIONS See your medication summary on the "After Visit Summary" that the nursing staff will review with you prior to discharge.  You may have some home medications which will be placed on hold until you complete the course of blood thinner medication.  It is important for you to complete the blood thinner medication as prescribed by your surgeon.  Continue your approved medications as instructed at time of discharge.  PRECAUTIONS If you experience chest pain or shortness of breath - call 911 immediately for transfer to the hospital emergency department.  If you develop a fever greater that 101 F, purulent drainage from wound, increased redness or drainage from wound, foul odor from the wound/dressing, or calf pain - CONTACT YOUR SURGEON.  FOLLOW-UP APPOINTMENTS Make sure you keep all of your appointments after your operation with your surgeon and caregivers. You should call the office at the above phone number and make an appointment for approximately two weeks after the date of your surgery or on the date instructed by your surgeon outlined in the "After Visit Summary".  RANGE OF MOTION AND STRENGTHENING EXERCISES  Rehabilitation of the knee is important following a knee injury or an operation. After just a few days of immobilization, the muscles of the thigh which control the knee become weakened and shrink (atrophy). Knee exercises are designed to build up the tone and strength of the thigh muscles and to improve knee motion. Often times heat used for twenty to thirty minutes before working out will loosen up your tissues and help with improving the range of motion but do not use heat for the first two weeks following surgery. These exercises can be done on a training (exercise) mat, on the floor, on a table or on a bed.  Use what ever works the best and is most comfortable for you Knee exercises include:  Leg Lifts - While your knee is still immobilized in a splint or cast, you can do straight leg raises. Lift the leg to 60 degrees, hold for 3 sec, and slowly lower the leg. Repeat 10-20 times 2-3 times daily. Perform this exercise against resistance later as your knee gets better.  Quad and Hamstring Sets - Tighten up the muscle on the front of the thigh (Quad) and hold for 5-10 sec. Repeat this 10-20 times hourly. Hamstring sets are done by pushing the foot backward against an object and holding for 5-10 sec. Repeat as with quad sets.  Leg Slides: Lying on your back, slowly slide your foot toward your buttocks, bending your knee up off the floor (only go as far as is comfortable). Then slowly slide your foot back down until your leg is flat on the floor again. Angel Wings: Lying on your back spread your legs to the side as far apart as you can without causing discomfort.  A rehabilitation program following serious knee injuries can speed recovery and prevent re-injury in the future due to weakened muscles. Contact your doctor or a physical therapist for more information on knee rehabilitation.   POST-OPERATIVE OPIOID TAPER INSTRUCTIONS: It is important to wean off of your opioid medication as soon as possible. If you do not need pain medication after your surgery it is ok to stop day one. Opioids include: Codeine, Hydrocodone(Norco, Vicodin), Oxycodone(Percocet, oxycontin) and hydromorphone amongst others.  Long term and even short term use of opiods can cause: Increased pain response Dependence Constipation Depression Respiratory depression And more.  Withdrawal symptoms can include Flu like symptoms Nausea, vomiting And more Techniques to manage these symptoms Hydrate well Eat regular healthy meals Stay active Use relaxation techniques(deep breathing, meditating, yoga) Do Not substitute Alcohol to help  with tapering If you have been on opioids for less than two weeks and do not have pain than it is ok to stop all together.  Plan to wean off of opioids This plan should start within one week post op of your joint replacement. Maintain the same interval or time between taking each dose and first decrease the dose.  Cut the total daily intake of opioids by one tablet each day Next start to increase the time between doses. The last dose that should be eliminated is the evening dose.   IF YOU ARE TRANSFERRED TO  A SKILLED REHAB FACILITY If the patient is transferred to a skilled rehab facility following release from the hospital, a list of the current medications will be sent to the facility for the patient to continue.  When discharged from the skilled rehab facility, please have the facility set up the patient's Home Health Physical Therapy prior to being released. Also, the skilled facility will be responsible for providing the patient with their medications at time of release from the facility to include their pain medication, the muscle relaxants, and their blood thinner medication. If the patient is still at the rehab facility at time of the two week follow up appointment, the skilled rehab facility will also need to assist the patient in arranging follow up appointment in our office and any transportation needs.  MAKE SURE YOU:  Understand these instructions.  Get help right away if you are not doing well or get worse.   DENTAL ANTIBIOTICS:  In most cases prophylactic antibiotics for Dental procdeures after total joint surgery are not necessary.  Exceptions are as follows:  1. History of prior total joint infection  2. Severely immunocompromised (Organ Transplant, cancer chemotherapy, Rheumatoid biologic meds such as Humera)  3. Poorly controlled diabetes (A1C &gt; 8.0, blood glucose over 200)  If you have one of these conditions, contact your surgeon for an antibiotic prescription,  prior to your dental procedure.    Pick up stool softner and laxative for home use following surgery while on pain medications. Do not submerge incision under water. Please use good hand washing techniques while changing dressing each day. May shower starting three days after surgery. Please use a clean towel to pat the incision dry following showers. Continue to use ice for pain and swelling after surgery. Do not use any lotions or creams on the incision until instructed by your surgeon.

## 2023-07-12 NOTE — Anesthesia Procedure Notes (Signed)
Anesthesia Regional Block: Adductor canal block   Pre-Anesthetic Checklist: , timeout performed,  Correct Patient, Correct Site, Correct Laterality,  Correct Procedure, Correct Position, site marked,  Risks and benefits discussed,  Surgical consent,  Pre-op evaluation,  At surgeon's request and post-op pain management  Laterality: Right and Lower  Prep: chloraprep       Needles:  Injection technique: Single-shot  Needle Type: Echogenic Needle     Needle Length: 9cm  Needle Gauge: 21     Additional Needles:   Procedures:,,,, ultrasound used (permanent image in chart),,    Narrative:  Start time: 07/12/2023 9:48 AM End time: 07/12/2023 9:54 AM Injection made incrementally with aspirations every 5 mL.  Performed by: Personally  Anesthesiologist: Jairo Ben, MD  Additional Notes: Pt identified in Holding room.  Monitors applied. Working IV access confirmed. Timeout, R thigh.  #21ga ECHOgenic Arrow block needle into adductor canal with US guidance.  20cc 0.75% Ropivacaine injected incrementally after negative test dose.  Patient asymptomatic, VSS, no heme aspirated, tolerated well.   Sandford Craze, MD

## 2023-07-12 NOTE — Progress Notes (Signed)
Orthopedic Tech Progress Note Patient Details:  Hannah [name] [birth year] [id number]  CPM Right Knee CPM Right Knee: On Right Knee Flexion (Degrees): 40 Right Knee Extension (Degrees): 10  Post Interventions Patient Tolerated: Well  [name] 07/12/2023, 12:10 PM

## 2023-07-12 NOTE — Care Plan (Signed)
Ortho Bundle Case Management Note  Patient Details  Name: Hannah [name] MRN: [id number] Date of Birth: 16-Feb-1946                  R TKA on 07/12/23.  DCP: Home with sister.  DME: RW ordered through [employer].  PT: [facility] on [facility] Referral faxed.   DME Arranged:  [facility] rolling DME Agency:  [employer]    Additional Comments: Please contact me with any questions of if this plan should need to change.    Despina Pole, CCM Case Manager, Raechel Chute  [phone] 07/12/2023, 11:10 AM

## 2023-07-12 NOTE — Op Note (Signed)
OPERATIVE REPORT-TOTAL KNEE ARTHROPLASTY   Pre-operative diagnosis- Osteoarthritis  Right knee(s)  Post-operative diagnosis- Osteoarthritis Right knee(s)  Procedure-  Right  Total Knee Arthroplasty  Surgeon- Gus Rankin. Shanna Strength, MD  Assistant- Arther Abbott, PA-C   Anesthesia-   Adductor canal block and spinal  EBL-25 mL   Drains None  Tourniquet time-  Total Tourniquet Time Documented: Thigh (Right) - 31 minutes Total: Thigh (Right) - 31 minutes     Complications- None  Condition-PACU - hemodynamically stable.   Brief Clinical Note  Hannah [name] is a 78 y.o. year old female with end stage OA of her right knee with progressively worsening pain and dysfunction. She has constant pain, with activity and at rest and significant functional deficits with difficulties even with ADLs. She has had extensive non-op management including analgesics, injections of cortisone and viscosupplements, and home exercise program, but remains in significant pain with significant dysfunction.Radiographs show bone on bone arthritis lateral and patellofemoral. She presents now for right Total Knee Arthroplasty.     Procedure in detail---   The patient is brought into the operating room and positioned supine on the operating table. After successful administration of  Adductor canal block and spinal,   a tourniquet is placed high on the  Right thigh(s) and the lower extremity is prepped and draped in the usual sterile fashion. Time out is performed by the operating team and then the  Right lower extremity is wrapped in Esmarch, knee flexed and the tourniquet inflated to 300 mmHg.       A midline incision is made with a ten blade through the subcutaneous tissue to the level of the extensor mechanism. A fresh blade is used to make a medial parapatellar arthrotomy. Soft tissue over the proximal medial tibia is subperiosteally elevated to the joint line with a knife and into the semimembranosus bursa  with a Cobb elevator. Soft tissue over the proximal lateral tibia is elevated with attention being paid to avoiding the patellar tendon on the tibial tubercle. The patella is everted, knee flexed 90 degrees and the ACL and PCL are removed. Findings are bone on bone lateral and patellofemoral with large global osteophytes.        The drill is used to create a starting hole in the distal femur and the canal is thoroughly irrigated with sterile saline to remove the fatty contents. The 5 degree Right  valgus alignment guide is placed into the femoral canal and the distal femoral cutting block is pinned to remove 9 mm off the distal femur. Resection is made with an oscillating saw.      The tibia is subluxed forward and the menisci are removed. The extramedullary alignment guide is placed referencing proximally at the medial aspect of the tibial tubercle and distally along the second metatarsal axis and tibial crest. The block is pinned to remove 2mm off the more deficient lateral  side. Resection is made with an oscillating saw. Size 4is the most appropriate size for the tibia and the proximal tibia is prepared with the modular drill and keel punch for that size.      The femoral sizing guide is placed and size 4 is most appropriate. Rotation is marked off the epicondylar axis and confirmed by creating a rectangular flexion gap at 90 degrees. The size 4 cutting block is pinned in this rotation and the anterior, posterior and chamfer cuts are made with the oscillating saw. The intercondylar block is then placed and that cut is made.  Trial size 4 tibial component, trial size 4 posterior stabilized femur and a 8  mm posterior stabilized rotating platform insert trial is placed. Full extension is achieved with excellent varus/valgus and anterior/posterior balance throughout full range of motion. The patella is everted and thickness measured to be 22  mm. Free hand resection is taken to 12 mm, a 35 template is  placed, lug holes are drilled, trial patella is placed, and it tracks normally. Osteophytes are removed off the posterior femur with the trial in place. All trials are removed and the cut bone surfaces prepared with pulsatile lavage. Cement is mixed and once ready for implantation, the size 4 tibial implant, size  4 posterior stabilized femoral component, and the size 35 patella are cemented in place and the patella is held with the clamp. The trial insert is placed and the knee held in full extension. The Exparel (20 ml mixed with 60 ml saline) is injected into the extensor mechanism, posterior capsule, medial and lateral gutters and subcutaneous tissues.  All extruded cement is removed and once the cement is hard the permanent 8 mm posterior stabilized rotating platform insert is placed into the tibial tray.      The wound is copiously irrigated with saline solution and the extensor mechanism closed with # 0 Stratofix suture. The tourniquet is released for a total tourniquet time of 31  minutes. Flexion against gravity is 140 degrees and the patella tracks normally. Subcutaneous tissue is closed with 2.0 vicryl and subcuticular with running 4.0 Monocryl. The incision is cleaned and dried and steri-strips and a bulky sterile dressing are applied. The limb is placed into a knee immobilizer and the patient is awakened and transported to recovery in stable condition.      Please note that a surgical assistant was a medical necessity for this procedure in order to perform it in a safe and expeditious manner. Surgical assistant was necessary to retract the ligaments and vital neurovascular structures to prevent injury to them and also necessary for proper positioning of the limb to allow for anatomic placement of the prosthesis.   Gus Rankin Clovia Reine, MD    07/12/2023, 11:30 AM

## 2023-07-13 ENCOUNTER — Encounter (HOSPITAL_COMMUNITY): Payer: Self-pay | Admitting: Orthopedic Surgery

## 2023-07-13 ENCOUNTER — Other Ambulatory Visit (HOSPITAL_COMMUNITY): Payer: Self-pay

## 2023-07-13 ENCOUNTER — Other Ambulatory Visit: Payer: Self-pay

## 2023-07-13 DIAGNOSIS — I4891 Unspecified atrial fibrillation: Secondary | ICD-10-CM | POA: Diagnosis not present

## 2023-07-13 DIAGNOSIS — E1122 Type 2 diabetes mellitus with diabetic chronic kidney disease: Secondary | ICD-10-CM | POA: Diagnosis not present

## 2023-07-13 DIAGNOSIS — I129 Hypertensive chronic kidney disease with stage 1 through stage 4 chronic kidney disease, or unspecified chronic kidney disease: Secondary | ICD-10-CM | POA: Diagnosis not present

## 2023-07-13 DIAGNOSIS — M1711 Unilateral primary osteoarthritis, right knee: Secondary | ICD-10-CM | POA: Diagnosis not present

## 2023-07-13 DIAGNOSIS — E039 Hypothyroidism, unspecified: Secondary | ICD-10-CM | POA: Diagnosis not present

## 2023-07-13 DIAGNOSIS — Z96652 Presence of left artificial knee joint: Secondary | ICD-10-CM | POA: Diagnosis not present

## 2023-07-13 DIAGNOSIS — N183 Chronic kidney disease, stage 3 unspecified: Secondary | ICD-10-CM | POA: Diagnosis not present

## 2023-07-13 DIAGNOSIS — J45909 Unspecified asthma, uncomplicated: Secondary | ICD-10-CM | POA: Diagnosis not present

## 2023-07-13 DIAGNOSIS — Z8616 Personal history of COVID-19: Secondary | ICD-10-CM | POA: Diagnosis not present

## 2023-07-13 LAB — BASIC METABOLIC PANEL
Anion gap: 7 (ref 5–15)
BUN: 23 mg/dL (ref 8–23)
CO2: 23 mmol/L (ref 22–32)
Calcium: 7.7 mg/dL — ABNORMAL LOW (ref 8.9–10.3)
Chloride: 104 mmol/L (ref 98–111)
Creatinine, Ser: 1.26 mg/dL — ABNORMAL HIGH (ref 0.44–1.00)
GFR, Estimated: 44 mL/min — ABNORMAL LOW (ref >60)
Glucose, Bld: 148 mg/dL — ABNORMAL HIGH (ref 70–99)
Potassium: 4.5 mmol/L (ref 3.5–5.1)
Sodium: 134 mmol/L — ABNORMAL LOW (ref 135–145)

## 2023-07-13 LAB — GLUCOSE, CAPILLARY
Glucose-Capillary: 133 mg/dL — ABNORMAL HIGH (ref 70–99)
Glucose-Capillary: 150 mg/dL — ABNORMAL HIGH (ref 70–99)

## 2023-07-13 LAB — CBC
HCT: 28.3 % — ABNORMAL LOW (ref 36.0–46.0)
Hemoglobin: 8.8 g/dL — ABNORMAL LOW (ref 12.0–15.0)
MCH: 31.3 pg (ref 26.0–34.0)
MCHC: 31.1 g/dL (ref 30.0–36.0)
MCV: 100.7 fL — ABNORMAL HIGH (ref 80.0–100.0)
Platelets: 152 10*3/uL (ref 150–400)
RBC: 2.81 MIL/uL — ABNORMAL LOW (ref 3.87–5.11)
RDW: 14 % (ref 11.5–15.5)
WBC: 8.7 10*3/uL (ref 4.0–10.5)
nRBC: 0 % (ref 0.0–0.2)

## 2023-07-13 MED ORDER — METHOCARBAMOL 500 MG PO TABS
500.0000 mg | ORAL_TABLET | Freq: Four times a day (QID) | ORAL | 0 refills | Status: DC | PRN
  Filled 2023-07-13 (×2): qty 40, 10d supply, fill #0

## 2023-07-13 MED ORDER — OXYCODONE HCL 5 MG PO TABS
5.0000 mg | ORAL_TABLET | ORAL | 0 refills | Status: DC | PRN
  Filled 2023-07-13: qty 42, 4d supply, fill #0

## 2023-07-13 MED ORDER — ONDANSETRON HCL 4 MG PO TABS
4.0000 mg | ORAL_TABLET | Freq: Four times a day (QID) | ORAL | 0 refills | Status: DC | PRN
  Filled 2023-07-13 (×2): qty 20, 5d supply, fill #0

## 2023-07-13 MED ORDER — TRAMADOL HCL 50 MG PO TABS
50.0000 mg | ORAL_TABLET | Freq: Four times a day (QID) | ORAL | 0 refills | Status: DC | PRN
  Filled 2023-07-13: qty 40, 5d supply, fill #0

## 2023-07-13 NOTE — Progress Notes (Signed)
Subjective: 1 Day Post-Op Procedure(s) (LRB): RIGHT TOTAL KNEE ARTHROPLASTY (Right) Patient reports pain as mild.   Patient seen in rounds by Dr. Lequita Halt. Patient is well, and has had no acute complaints or problems No issues overnight. Denies chest pain, SOB, or calf pain. Foley catheter removed this AM.  We will continue therapy today, ambulated 25' yesterday.   Objective: Vital signs in last 24 hours: Temp:  [96.4 F (35.8 C)-98.7 F (37.1 C)] 98.7 F (37.1 C) (10/15 0622) Pulse Rate:  [50-93] 93 (10/15 0622) Resp:  [12-20] 17 (10/15 0622) BP: (87-171)/(50-104) 143/61 (10/15 0622) SpO2:  [93 %-100 %] 94 % (10/15 0750)  Intake/Output from previous day:  Intake/Output Summary (Last 24 hours) at 07/13/2023 0847 Last data filed at 07/13/2023 0622 Gross per 24 hour  Intake 1631.65 ml  Output 1375 ml  Net 256.65 ml     Intake/Output this shift: No intake/output data recorded.  Labs: Recent Labs    07/13/23 0327  HGB 8.8*   Recent Labs    07/13/23 0327  WBC 8.7  RBC 2.81*  HCT 28.3*  PLT 152   Recent Labs    07/13/23 0327  NA 134*  K 4.5  CL 104  CO2 23  BUN 23  CREATININE 1.26*  GLUCOSE 148*  CALCIUM 7.7*   No results for input(s): "LABPT", "INR" in the last 72 hours.  Exam: General - Patient is Alert and Oriented Extremity - Neurologically intact Neurovascular intact Sensation intact distally Dorsiflexion/Plantar flexion intact Dressing - dressing C/D/I Motor Function - intact, moving foot and toes well on exam.   Past Medical History:  Diagnosis Date   Allergy 25 years ago.   Anemia    Arthritis    Asthma    Atrial fibrillation (HCC)    Cataract Had cataracts surgery 2015 & 2016   Chronic kidney disease    Diabetes mellitus without complication (HCC)    Gout 12/17/2014   patient reported   Heart murmur 4 years ago   Hyperlipidemia    Hypertension    Hypothyroidism    Systemic lupus erythematosus (HCC)    Vitiligo      Assessment/Plan: 1 Day Post-Op Procedure(s) (LRB): RIGHT TOTAL KNEE ARTHROPLASTY (Right) Principal Problem:   OA (osteoarthritis) of knee Active Problems:   Primary osteoarthritis of right knee  Estimated body mass index is 35.61 kg/m as calculated from the following:   Height as of this encounter: 4\' 10"  (1.473 m).   Weight as of this encounter: 77.3 kg. Advance diet Up with therapy D/C IV fluids   Patient's anticipated LOS is less than 2 midnights, meeting these requirements: - Lives within 1 hour of care - Has a competent adult at home to recover with post-op recover - NO history of  - Chronic pain requiring opioids  - Coronary Artery Disease  - Heart failure  - Heart attack  - Stroke  - DVT/VTE  - Respiratory Failure/COPD  - Renal failure  - Advanced Liver disease  DVT Prophylaxis -  Eliquis Weight bearing as tolerated. Continue therapy.  Plan is to go Home after hospital stay. Had slower progression with last TKA, so may not be ready to discharge later today. Okay to go if meeting goals with therapy however.  [facility]uled [facility] Follow-up in the office in 2 weeks.  The PDMP database was reviewed today prior to any opioid medications being prescribed to this patient.  Arther Abbott, PA-C Orthopedic Surgery [phone] 07/13/2023,  8:47 AM

## 2023-07-13 NOTE — Progress Notes (Signed)
Physical Therapy Treatment Patient Details Name: Hannah [name] MRN: [id number] DOB: 02/19/1946 Today's Date: 07/13/2023   History of Present Illness 77 yo female s/p R TKA on 10/147/24; PMH: L TKA, PVCs, covid, CKD, lupus    PT Comments  Pt progressing well this pm; sister present for session. Pt reports pain controlled with mobility; reviewed TKA HEP and handout with pt and sister. Ready to d/c from PT standpoint with family assist as needed.    If plan is discharge home, recommend the following: A little help with walking and/or transfers;A little help with bathing/dressing/bathroom;Help with stairs or ramp for entrance;Assistance with cooking/housework;Assist for transportation   Can travel by private vehicle        Equipment Recommendations  Rolling walker (2 wheels) (youth)    Recommendations for Other Services       Precautions / Restrictions Precautions Precautions: Fall;Knee Required Braces or Orthoses: Knee Immobilizer - Right Knee Immobilizer - Right: Discontinue once straight leg raise with < 10 degree lag Restrictions Weight Bearing Restrictions: No Other Position/Activity Restrictions: WBAT     Mobility  Bed Mobility               General bed mobility comments: pt in recliner    Transfers Overall transfer level: Needs assistance Equipment used: Rolling walker (2 wheels) Transfers: Sit to/from Stand Sit to Stand: Contact guard assist, Supervision           General transfer comment: cues for hand placement and RLE position    Ambulation/Gait Ambulation/Gait assistance: Contact guard assist, Supervision Gait Distance (Feet): 50 Feet Assistive device: Rolling walker (2 wheels) Gait Pattern/deviations: Step-to pattern       General Gait Details: intermittent cues for sequence, step pattern for safety and RW distance from self   Stairs             Wheelchair Mobility     Tilt Bed    Modified Rankin (Stroke Patients Only)        Balance           Standing balance support: During functional activity, Reliant on assistive device for balance Standing balance-Leahy Scale: Poor                              Cognition Arousal: Alert Behavior During Therapy: WFL for tasks assessed/performed Overall Cognitive Status: Within Functional Limits for tasks assessed                                          Exercises Total Joint Exercises Ankle Circles/Pumps: AROM, Both, 10 reps Quad Sets: AROM, Right, 5 reps Heel Slides: AAROM, Right, 10 reps Straight Leg Raises: AAROM, Right, 10 reps    General Comments        Pertinent Vitals/Pain Pain Assessment Pain Assessment: 0-10 Pain Score: 5  Pain Location: right knee Pain Descriptors / Indicators: Aching, Sore Pain Intervention(s): Limited activity within patient's tolerance, Monitored during session, Premedicated before session, Repositioned    Home Living                          Prior Function            PT Goals (current goals can now be found in the care plan section) Acute Rehab PT Goals PT Goal Formulation: With patient Time  For Goal Achievement: 07/19/23 Potential to Achieve Goals: Good Progress towards PT goals: Progressing toward goals    Frequency    7X/week      PT Plan      Co-evaluation              AM-PAC PT "6 Clicks" Mobility   Outcome Measure  Help needed turning from your back to your side while in a flat bed without using bedrails?: A Little Help needed moving from lying on your back to sitting on the side of a flat bed without using bedrails?: A Little Help needed moving to and from a bed to a chair (including a wheelchair)?: A Little Help needed standing up from a chair using your arms (e.g., wheelchair or bedside chair)?: A Little Help needed to walk in hospital room?: A Little Help needed climbing 3-5 steps with a railing? : A Little 6 Click Score: 18    End of  Session Equipment Utilized During Treatment: Gait belt;Right knee immobilizer Activity Tolerance: Patient tolerated treatment well Patient left: in chair;with call bell/phone within reach;with chair alarm set;with family/visitor present   PT Visit Diagnosis: Other abnormalities of gait and mobility (R26.89);Difficulty in walking, not elsewhere classified (R26.2)     Time: 1610-9604 PT Time Calculation (min) (ACUTE ONLY): 24 min  Charges:    $Gait Training: 8-22 mins $Therapeutic Exercise: 8-22 mins PT General Charges $$ ACUTE PT VISIT: 1 Visit                     Deatra Mcmahen, PT  Acute Rehab Dept [facility]) [phone]  07/13/2023    [facility] 07/13/2023, 3:22 PM

## 2023-07-13 NOTE — Plan of Care (Signed)
Problem: Education: Goal: Knowledge of the prescribed therapeutic regimen will improve Outcome: Progressing Goal: Individualized Educational Video(s) Outcome: Completed/Met   Problem: Activity: Goal: Ability to avoid complications of mobility impairment will improve Outcome: Progressing Goal: Range of joint motion will improve Outcome: Adequate for Discharge   Problem: Clinical Measurements: Goal: Postoperative complications will be avoided or minimized Outcome: Progressing   Problem: Pain Management: Goal: Pain level will decrease with appropriate interventions Outcome: Progressing   Problem: Skin Integrity: Goal: Will show signs of wound healing Outcome: Progressing   Problem: Education: Goal: Knowledge of General Education information will improve Description: Including pain rating scale, medication(s)/side effects and non-pharmacologic comfort measures Outcome: Progressing   Problem: Health Behavior/Discharge Planning: Goal: Ability to manage health-related needs will improve Outcome: Progressing   Problem: Clinical Measurements: Goal: Ability to maintain clinical measurements within normal limits will improve Outcome: Progressing Goal: Will remain free from infection Outcome: Progressing Goal: Diagnostic test results will improve Outcome: Progressing Goal: Respiratory complications will improve Outcome: Progressing Goal: Cardiovascular complication will be avoided Outcome: Progressing   Problem: Activity: Goal: Risk for activity intolerance will decrease Outcome: Adequate for Discharge   Problem: Nutrition: Goal: Adequate nutrition will be maintained Outcome: Completed/Met   Problem: Coping: Goal: Level of anxiety will decrease Outcome: Progressing   Problem: Elimination: Goal: Will not experience complications related to bowel motility Outcome: Progressing Goal: Will not experience complications related to urinary retention Outcome: Progressing    Problem: Pain Managment: Goal: General experience of comfort will improve Outcome: Progressing   Problem: Safety: Goal: Ability to remain free from injury will improve Outcome: Progressing   Problem: Skin Integrity: Goal: Risk for impaired skin integrity will decrease Outcome: Progressing

## 2023-07-13 NOTE — TOC Transition Note (Signed)
Transition of Care [facility]) - CM/SW Discharge Note   Patient Details  Name: Hannah [name] MRN: [id number] Date of Birth: 28-Jul-1946  Transition [facility]) CM/SW Contact:  Amada Jupiter, LCSW Phone Number: 07/13/2023, 10:36 AM   Clinical Narrative:    Met with pt who confirms need for youth RW;  initially MD office had placed order with [employer], however, informed that they are not in pt's insurance network.  Discussed with pt and she has no agency preference and order placed with [employer] for delivery to room prior to discharge.  OPPT already arranged with [facility].)  No further TOC needs.   Final next level of care: OP Rehab Barriers to Discharge: No Barriers Identified   Patient Goals and CMS Choice      Discharge Placement                         Discharge Plan and Services Additional resources added to the After Visit Summary for                  DME Arranged: Walker rolling (youth) DME Agency: [employer] Date DME Agency Contacted: 07/13/23 Time DME Agency Contacted: 1036 Representative spoke with at DME Agency: Ian Malkin            Social Determinants of Health (SDOH) Interventions SDOH Screenings   Food Insecurity: No Food Insecurity (07/12/2023)  Housing: Low Risk  (07/12/2023)  Transportation Needs: No Transportation Needs (07/12/2023)  Utilities: Not At Risk (07/12/2023)  Alcohol Screen: Low Risk  (06/29/2023)  Depression (PHQ2-9): Medium Risk (06/30/2023)  Financial Resource Strain: Low Risk  (06/29/2023)  Physical Activity: Sufficiently Active (06/29/2023)  Social Connections: Moderately Integrated (06/29/2023)  Stress: No Stress Concern Present (06/29/2023)  Tobacco Use: Low Risk  (07/12/2023)  Health Literacy: Adequate Health Literacy (06/30/2023)     Readmission Risk Interventions     No data to display

## 2023-07-13 NOTE — Care Management Obs Status (Signed)
MEDICARE OBSERVATION STATUS NOTIFICATION   Patient Details  Name: Hannah [name] MRN: [id number] Date of Birth: [birth year]   Medicare Observation Status Notification Given:  Yes    Amada Jupiter, LCSW 07/13/2023, 10:39 AM

## 2023-07-13 NOTE — Telephone Encounter (Signed)
Application faxed to company 07/13/23

## 2023-07-13 NOTE — Progress Notes (Signed)
Physical Therapy Treatment Patient Details Name: Hannah [name] MRN: [id number] DOB: 1946/05/10 Today's Date: 07/13/2023   History of Present Illness 77 yo female s/p R TKA on 10/147/24; PMH: L TKA, PVCs, covid, CKD, lupus    PT Comments  Pt is progressing toward goals, amb incr distance with pain well controlled. Will see again in pm, pt is motivated to d/c later today    If plan is discharge home, recommend the following: A little help with walking and/or transfers;A little help with bathing/dressing/bathroom;Help with stairs or ramp for entrance;Assistance with cooking/housework;Assist for transportation   Can travel by private vehicle        Equipment Recommendations  Rolling walker (2 wheels) (youth)    Recommendations for Other Services       Precautions / Restrictions Precautions Precautions: Fall;Knee Required Braces or Orthoses: Knee Immobilizer - Right Knee Immobilizer - Right: Discontinue once straight leg raise with < 10 degree lag Restrictions Weight Bearing Restrictions: No Other Position/Activity Restrictions: WBAT     Mobility  Bed Mobility               General bed mobility comments: pt in recliner    Transfers Overall transfer level: Needs assistance Equipment used: Rolling walker (2 wheels) Transfers: Sit to/from Stand Sit to Stand: Contact guard assist           General transfer comment: cues for hand placement and RLE position    Ambulation/Gait Ambulation/Gait assistance: Contact guard assist Gait Distance (Feet): 40 Feet Assistive device: Rolling walker (2 wheels) Gait Pattern/deviations: Step-to pattern       General Gait Details: cues for sequence, step pattern for safety and RW distance from self   Stairs             Wheelchair Mobility     Tilt Bed    Modified Rankin (Stroke Patients Only)       Balance           Standing balance support: During functional activity, Reliant on assistive device  for balance Standing balance-Leahy Scale: Poor                              Cognition Arousal: Alert Behavior During Therapy: WFL for tasks assessed/performed Overall Cognitive Status: Within Functional Limits for tasks assessed                                          Exercises Total Joint Exercises Ankle Circles/Pumps: AROM, Both, 10 reps Quad Sets: AROM, Right, 5 reps Heel Slides: AAROM, Right, 10 reps Straight Leg Raises: AAROM, Right, 5 reps    General Comments        Pertinent Vitals/Pain Pain Assessment Pain Assessment: 0-10 Pain Score: 4  Pain Location: right knee Pain Descriptors / Indicators: Aching, Sore Pain Intervention(s): Limited activity within patient's tolerance, Monitored during session, Premedicated before session, Repositioned, Ice applied    Home Living                          Prior Function            PT Goals (current goals can now be found in the care plan section) Acute Rehab PT Goals PT Goal Formulation: With patient Time For Goal Achievement: 07/19/23 Potential to Achieve Goals: Good Progress towards PT  goals: Progressing toward goals    Frequency    7X/week      PT Plan      Co-evaluation              AM-PAC PT "6 Clicks" Mobility   Outcome Measure  Help needed turning from your back to your side while in a flat bed without using bedrails?: A Little Help needed moving from lying on your back to sitting on the side of a flat bed without using bedrails?: A Little Help needed moving to and from a bed to a chair (including a wheelchair)?: A Little Help needed standing up from a chair using your arms (e.g., wheelchair or bedside chair)?: A Little Help needed to walk in hospital room?: A Little Help needed climbing 3-5 steps with a railing? : A Little 6 Click Score: 18    End of Session Equipment Utilized During Treatment: Gait belt;Right knee immobilizer Activity Tolerance:  Patient tolerated treatment well Patient left: in chair;with call bell/phone within reach;with chair alarm set;with family/visitor present   PT Visit Diagnosis: Other abnormalities of gait and mobility (R26.89);Difficulty in walking, not elsewhere classified (R26.2)     Time: 0865-7846 PT Time Calculation (min) (ACUTE ONLY): 15 min  Charges:    $Gait Training: 8-22 mins PT General Charges $$ ACUTE PT VISIT: 1 Visit                     Zahriyah Joo, PT  Acute Rehab Dept [facility]) [phone]  07/13/2023    [facility] 07/13/2023, 11:54 AM

## 2023-07-13 NOTE — Plan of Care (Signed)
Problem: Education: Goal: Ability to describe self-care measures that may prevent or decrease complications (Diabetes Survival Skills Education) will improve Outcome: Progressing   Problem: Coping: Goal: Ability to adjust to condition or change in health will improve Outcome: Progressing   Problem: Elimination: Goal: Will not experience complications related to bowel motility Outcome: Progressing

## 2023-07-14 ENCOUNTER — Telehealth: Payer: Self-pay | Admitting: Pharmacist

## 2023-07-14 ENCOUNTER — Other Ambulatory Visit: Payer: Medicare HMO

## 2023-07-14 ENCOUNTER — Other Ambulatory Visit (HOSPITAL_COMMUNITY): Payer: Self-pay

## 2023-07-14 NOTE — Progress Notes (Unsigned)
Attempted to contact patient for scheduled appointment for medication management. Left HIPAA compliant message for patient to return my call at their convenience.   Will send MyChart  Catie Eppie Gibson, PharmD, BCACP, CPP Clinical Pharmacist [facility] [phone]

## 2023-07-14 NOTE — Discharge Summary (Signed)
Patient ID: Hannah [name] MRN: [id number] DOB/AGE: 10-18-1945 77 y.o.  Admit date: 07/12/2023 Discharge date: 07/13/2023  Admission Diagnoses:  Principal Problem:   OA (osteoarthritis) of knee Active Problems:   Primary osteoarthritis of right knee   Discharge Diagnoses:  Same  Past Medical History:  Diagnosis Date   Allergy 25 years ago.   Anemia    Arthritis    Asthma    Atrial fibrillation (HCC)    Cataract Had cataracts surgery 2015 & 2016   Chronic kidney disease    Diabetes mellitus without complication (HCC)    Gout 12/17/2014   patient reported   Heart murmur 4 years ago   Hyperlipidemia    Hypertension    Hypothyroidism    Systemic lupus erythematosus (HCC)    Vitiligo     Surgeries: Procedure(s): RIGHT TOTAL KNEE ARTHROPLASTY on 07/12/2023   Consultants:   Discharged Condition: Improved  Hospital Course: Hannah [name] is an 77 y.o. female who was admitted 07/12/2023 for operative treatment ofOA (osteoarthritis) of knee. Patient has severe unremitting pain that affects sleep, daily activities, and work/hobbies. After pre-op clearance the patient was taken to the operating room on 07/12/2023 and underwent  Procedure(s): RIGHT TOTAL KNEE ARTHROPLASTY.    Patient was given perioperative antibiotics:  Anti-infectives (From admission, onward)    Start     Dose/Rate Route Frequency Ordered Stop   07/12/23 1600  ceFAZolin (ANCEF) IVPB 2g/100 mL premix        2 g 200 mL/hr over 30 Minutes Intravenous Every 6 hours 07/12/23 1406 07/12/23 2200   07/12/23 0745  ceFAZolin (ANCEF) IVPB 2g/100 mL premix        2 g 200 mL/hr over 30 Minutes Intravenous On call to O.R. 07/12/23 0730 07/12/23 1028        Patient was given sequential compression devices, early ambulation, and chemoprophylaxis to prevent DVT.  Patient benefited maximally from hospital stay and there were no complications.    Recent vital signs: No data found.   Recent laboratory  studies:  Recent Labs    07/13/23 0327  WBC 8.7  HGB 8.8*  HCT 28.3*  PLT 152  NA 134*  K 4.5  CL 104  CO2 23  BUN 23  CREATININE 1.26*  GLUCOSE 148*  CALCIUM 7.7*     Discharge Medications:   Allergies as of 07/13/2023       Reactions   Shellfish Allergy Anaphylaxis, Hives        Medication List     TAKE these medications    Accu-Chek Guide test strip Generic drug: glucose blood USE TO CHECK BLOOD SUGAR DAILY   Accu-Chek Softclix Lancets lancets Use to check blood sugars daily E11.69   albuterol 108 (90 Base) MCG/ACT inhaler Commonly known as: VENTOLIN HFA Inhale 2 puffs into the lungs every 6 (six) hours as needed for wheezing or shortness of breath.   Alcohol Pads 70 % Pads Use as directed to check blood sugars 1 time per day dx: e11.22   allopurinol 100 MG tablet Commonly known as: Zyloprim Take 1 tablet (100 mg total) by mouth 3 (three) times a week for 90 doses. What changed: when to take this   amLODipine 5 MG tablet Commonly known as: NORVASC Take 1 tablet (5 mg total) by mouth at bedtime.   apixaban 5 MG Tabs tablet Commonly known as: ELIQUIS Take 1 tablet (5 mg total) by mouth 2 (two) times daily.   atorvastatin 80 MG tablet Commonly known  as: LIPITOR TAKE 1 TABLET BY MOUTH EVERY DAY What changed: when to take this   B-complex with vitamin C tablet Take 1 tablet by mouth daily with lunch.   benzonatate 100 MG capsule Commonly known as: Tessalon Perles Take 1 capsule (100 mg total) by mouth 3 (three) times daily as needed for cough.   budesonide-formoterol 160-4.5 MCG/ACT inhaler Commonly known as: SYMBICORT INHALE 2 PUFFS BY MOUTH TWICE DAILY IN THE MORNING AND IN THE EVENING What changed:  how much to take when to take this reasons to take this additional instructions   Calcium Carb-Cholecalciferol 600-800 MG-UNIT Tabs Take 1 tablet by mouth daily with lunch.   hydroxychloroquine 200 MG tablet Commonly known as:  PLAQUENIL TAKE 1 TABLET( 200 MG) BY MOUTH IN THE MORNING AND 1/2 TABLET (100 MG) IN THE EVENING MONDAY THROUGH FRIDAY ONLY.   irbesartan 300 MG tablet Commonly known as: AVAPRO TAKE 1 TABLET(300 MG) BY MOUTH DAILY   Jardiance 10 MG Tabs tablet Generic drug: empagliflozin Take 10 mg by mouth in the morning.   methocarbamol 500 MG tablet Commonly known as: ROBAXIN Take 1 tablet (500 mg total) by mouth every 6 (six) hours as needed for muscle spasms.   metoprolol succinate 25 MG 24 hr tablet Commonly known as: TOPROL-XL TAKE 1 TABLET(25 MG) BY MOUTH DAILY   multivitamin with minerals Tabs tablet Take 1 tablet by mouth daily with lunch. Women's Multivitamin   ondansetron 4 MG tablet Commonly known as: ZOFRAN Take 1 tablet (4 mg total) by mouth every 6 (six) hours as needed for nausea.   oxyCODONE 5 MG immediate release tablet Commonly known as: Oxy IR/ROXICODONE Take 1-2 tablets (5-10 mg total) by mouth every 4 (four) hours as needed for severe pain (pain score 7-10).   Prolia 60 MG/ML Sosy injection Generic drug: denosumab Inject 60 mg into the skin every 6 (six) months. Courier to rheum: [address], [place] [address]. Appt on 12/07/2022   Synthroid 88 MCG tablet Generic drug: levothyroxine TAKE 1 TABLET BY MOUTH EVERY DAY MONDAY TO SATURDAY AND OFF ON SUNDAYS   Systane 0.4-0.3 % Soln Generic drug: Polyethyl Glycol-Propyl Glycol Place 1-2 drops into both eyes 3 (three) times daily as needed (dry/irritated eyes.).   traMADol 50 MG tablet Commonly known as: ULTRAM Take 1-2 tablets (50-100 mg total) by mouth every 6 (six) hours as needed for moderate pain (pain score 4-6).   VENOFER IV Inject 1 Dose into the vein as needed (per patient labs).   VITAMIN C PO Take 500 mg by mouth daily with lunch.   VITAMIN D PO Take 2,000 Units by mouth daily with lunch.               Discharge Care Instructions  (From admission, onward)           Start      Ordered   07/13/23 0000  Weight bearing as tolerated        10 /15/24 0851   07/13/23 0000  Change dressing       Comments: You may remove the bulky bandage (ACE wrap and gauze) two days after surgery. You will have an adhesive waterproof bandage underneath. Leave this in place until your first follow-up appointment.   07/13/23 0851            Diagnostic Studies: No results found.  Disposition: Discharge disposition: 01-Home or Self Care       Discharge Instructions     Call MD /  Call 911   Complete by: As directed    If you experience chest pain or shortness of breath, CALL 911 and be transported to the hospital emergency room.  If you develope a fever above 101 F, pus (white drainage) or increased drainage or redness at the wound, or calf pain, call your surgeon's office.   Change dressing   Complete by: As directed    You may remove the bulky bandage (ACE wrap and gauze) two days after surgery. You will have an adhesive waterproof bandage underneath. Leave this in place until your first follow-up appointment.   Constipation Prevention   Complete by: As directed    Drink plenty of fluids.  Prune juice may be helpful.  You may use a stool softener, such as Colace (over the counter) 100 mg twice a day.  Use MiraLax (over the counter) for constipation as needed.   Diet - low sodium heart healthy   Complete by: As directed    Do not put a pillow under the knee. Place it under the heel.   Complete by: As directed    Driving restrictions   Complete by: As directed    No driving for two weeks   Post-operative opioid taper instructions:   Complete by: As directed    POST-OPERATIVE OPIOID TAPER INSTRUCTIONS: It is important to wean off of your opioid medication as soon as possible. If you do not need pain medication after your surgery it is ok to stop day one. Opioids include: Codeine, Hydrocodone(Norco, Vicodin), Oxycodone(Percocet, oxycontin) and hydromorphone amongst  others.  Long term and even short term use of opiods can cause: Increased pain response Dependence Constipation Depression Respiratory depression And more.  Withdrawal symptoms can include Flu like symptoms Nausea, vomiting And more Techniques to manage these symptoms Hydrate well Eat regular healthy meals Stay active Use relaxation techniques(deep breathing, meditating, yoga) Do Not substitute Alcohol to help with tapering If you have been on opioids for less than two weeks and do not have pain than it is ok to stop all together.  Plan to wean off of opioids This plan should start within one week post op of your joint replacement. Maintain the same interval or time between taking each dose and first decrease the dose.  Cut the total daily intake of opioids by one tablet each day Next start to increase the time between doses. The last dose that should be eliminated is the evening dose.      TED hose   Complete by: As directed    Use stockings (TED hose) for three weeks on both leg(s).  You may remove them at night for sleeping.   Weight bearing as tolerated   Complete by: As directed         Follow-up Information     Ollen Gross, MD. Go on 07/27/2023.   Specialty: Orthopedic Surgery Why: You are scheduled for first post op appt on Tuesday October 29 at 5:30pm. Contact information: [address] [place] [address] [phone]                  Signed: Arther Abbott 07/14/2023, 1:38 PM

## 2023-07-15 ENCOUNTER — Other Ambulatory Visit (INDEPENDENT_AMBULATORY_CARE_PROVIDER_SITE_OTHER): Payer: Medicare HMO | Admitting: Pharmacist

## 2023-07-15 DIAGNOSIS — E1122 Type 2 diabetes mellitus with diabetic chronic kidney disease: Secondary | ICD-10-CM

## 2023-07-15 DIAGNOSIS — N1831 Chronic kidney disease, stage 3a: Secondary | ICD-10-CM

## 2023-07-16 ENCOUNTER — Ambulatory Visit: Payer: Medicare HMO | Attending: Internal Medicine

## 2023-07-16 ENCOUNTER — Other Ambulatory Visit: Payer: Self-pay | Admitting: Rheumatology

## 2023-07-16 ENCOUNTER — Other Ambulatory Visit: Payer: Self-pay

## 2023-07-16 DIAGNOSIS — M6281 Muscle weakness (generalized): Secondary | ICD-10-CM | POA: Diagnosis not present

## 2023-07-16 DIAGNOSIS — R262 Difficulty in walking, not elsewhere classified: Secondary | ICD-10-CM | POA: Insufficient documentation

## 2023-07-16 DIAGNOSIS — M25561 Pain in right knee: Secondary | ICD-10-CM | POA: Diagnosis not present

## 2023-07-16 DIAGNOSIS — M0579 Rheumatoid arthritis with rheumatoid factor of multiple sites without organ or systems involvement: Secondary | ICD-10-CM

## 2023-07-16 DIAGNOSIS — G8929 Other chronic pain: Secondary | ICD-10-CM | POA: Diagnosis not present

## 2023-07-16 NOTE — Therapy (Signed)
OUTPATIENT PHYSICAL THERAPY LOWER EXTREMITY EVALUATION   Patient Name: Hannah [name] MRN: [id number] DOB:1945/10/30, 77 y.o., female Today's Date: 07/16/2023  END OF SESSION:  PT End of Session - 07/16/23 1601     Visit Number 1    Number of Visits 17    Date for PT Re-Evaluation 09/17/23    Authorization Type [employer] MEDICARE HMO    PT Start Time 1230    PT Stop Time 1315    PT Time Calculation (min) 45 min    Equipment Utilized During Treatment Other (comment)   WC, RW   Activity Tolerance Patient tolerated treatment well;Patient limited by pain    Behavior During Therapy WFL for tasks assessed/performed             Past Medical History:  Diagnosis Date   Allergy 25 years ago.   Anemia    Arthritis    Asthma    Atrial fibrillation (HCC)    Cataract Had cataracts surgery 2015 & 2016   Chronic kidney disease    Diabetes mellitus without complication (HCC)    Gout 12/17/2014   patient reported   Heart murmur 4 years ago   Hyperlipidemia    Hypertension    Hypothyroidism    Systemic lupus erythematosus (HCC)    Vitiligo    Past Surgical History:  Procedure Laterality Date   CATARACT EXTRACTION Bilateral 2015   DILATION AND CURETTAGE OF UTERUS     DOPPLER ECHOCARDIOGRAPHY  05/2018   Internist to review with pt; potential heart murmur 06/20/18   EYE SURGERY  Cataract Surgery, 2015 & 2016.   keratosis removal  2021   KNEE CLOSED REDUCTION Left 07/06/2022   Procedure: CLOSED MANIPULATION KNEE;  Surgeon: Ollen Gross, MD;  Location: [facility];  Service: Orthopedics;  Laterality: Left;   REPLACEMENT TOTAL KNEE Left    SKIN SURGERY  11/30/2018   left side of face   TOOTH EXTRACTION     TOTAL KNEE ARTHROPLASTY Left 02/02/2022   Procedure: TOTAL KNEE ARTHROPLASTY;  Surgeon: Ollen Gross, MD;  Location: [facility];  Service: Orthopedics;  Laterality: Left;   TOTAL KNEE ARTHROPLASTY Right 07/12/2023   Procedure: RIGHT TOTAL KNEE ARTHROPLASTY;  Surgeon: Ollen Gross, MD;  Location: [facility];  Service: Orthopedics;  Laterality: Right;   Patient Active Problem List   Diagnosis Date Noted   Primary osteoarthritis of right knee 07/12/2023   Acquired thrombophilia (HCC) 01/12/2023   Arthrofibrosis of total knee arthroplasty (HCC) 07/06/2022   OA (osteoarthritis) of knee 02/02/2022   Primary osteoarthritis of left knee 02/02/2022   Chronic bilateral low back pain without sciatica 06/25/2021   Paresthesia and pain of extremity 06/25/2021   Atrial fibrillation (HCC) 04/16/2021   Demand ischemia (HCC) 03/26/2021   Unspecified atrial fibrillation (HCC) 03/26/2021   COVID-19 virus infection 03/26/2021   PVC (premature ventricular contraction) 05/06/2020   Type 2 diabetes mellitus with stage 3 chronic kidney disease, without long-term current use of insulin (HCC) 03/20/2020   Palpitations 03/20/2020   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/2020   Primary hypothyroidism 03/20/2020   Vitiligo 03/20/2020   Iron deficiency anemia 08/12/2017   Osteoporosis 10/02/2016   Systemic lupus erythematosus (HCC) 10/02/2016   Rheumatoid arthritis involving multiple sites with positive rheumatoid factor (HCC) 10/02/2016   High risk medication use 10/02/2016   History of chronic kidney disease 10/02/2016   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/2016   Primary osteoarthritis of both knees 10/02/2016   Vitamin D deficiency 10/02/2016  History of diabetes mellitus 10/02/2016   History of hypertension 10/02/2016   History of asthma 10/02/2016   Absolute anemia    MGUS (monoclonal gammopathy of unknown significance)    Normocytic anemia 03/21/2015   Essential hypertension 03/20/2015   Abnormal CT of the chest 07/30/2012   Asthma 06/19/2012    PCP: Dorothyann Peng, MD  REFERRING PROVIDER: Ollen Gross, MD   REFERRING DIAG: M17.11 (ICD-10-CM) - Unilateral primary osteoarthritis, right knee   THERAPY DIAG:  Chronic pain of right knee  Muscle  weakness (generalized)  Difficulty in walking, not elsewhere classified  Rationale for Evaluation and Treatment: Rehabilitation  ONSET DATE: 07/12/23  SUBJECTIVE:   SUBJECTIVE STATEMENT: Pt reports she has been completing her HEP 1x daily and using cold packs on a frequent basis for pain and swelling management.  PERTINENT HISTORY: High BMI, L LKA, Afib  PAIN:  Are you having pain? Yes: NPRS scale: 7/10; pain range 5-10/10 Pain location: R knee Pain description: ache ,throb ,sharp Aggravating factors: movement, lifting, wt bearing Relieving factors: Pain medication, muscle relaxer, cold packs  PRECAUTIONS: Knee  RED FLAGS: None   WEIGHT BEARING RESTRICTIONS: No  FALLS:  Has patient fallen in last 6 months? No  LIVING ENVIRONMENT: Lives with: lives alone Lives in: House/apartment Stairs: No Has following equipment at home: Single point cane, Environmental consultant - 2 wheeled, Wheelchair (manual), and bed side commode  OCCUPATION: Retired  PLOF: Independent with household mobility with device  PATIENT GOALS: good use of my r knee  NEXT MD VISIT: 07/27/23  OBJECTIVE:  Note: Objective measures were completed at Evaluation unless otherwise noted.  DIAGNOSTIC FINDINGS: None for the R knee in Epic  PATIENT SURVEYS:  {rehab surveys:24030}  COGNITION: Overall cognitive status: {cognition:24006}     SENSATION: {sensation:27233}  EDEMA:  {edema:24020}  MUSCLE LENGTH: Hamstrings: Right *** deg; Left *** deg Maisie Fus test: Right *** deg; Left *** deg  POSTURE: {posture:25561}  PALPATION: ***  LOWER EXTREMITY ROM:  {AROM/PROM:27142} ROM Right eval Left eval  Hip flexion    Hip extension    Hip abduction    Hip adduction    Hip internal rotation    Hip external rotation    Knee flexion    Knee extension    Ankle dorsiflexion    Ankle plantarflexion    Ankle inversion    Ankle eversion     (Blank rows = not tested)  LOWER EXTREMITY MMT:  MMT Right eval  Left eval  Hip flexion    Hip extension    Hip abduction    Hip adduction    Hip internal rotation    Hip external rotation    Knee flexion    Knee extension    Ankle dorsiflexion    Ankle plantarflexion    Ankle inversion    Ankle eversion     (Blank rows = not tested)  LOWER EXTREMITY SPECIAL TESTS:  {LEspecialtests:26242}  FUNCTIONAL TESTS:  {Functional tests:24029}  GAIT: Distance walked: *** Assistive device utilized: {Assistive devices:23999} Level of assistance: {Levels of assistance:24026} Comments: ***   TODAY'S TREATMENT:  [facility] PT Treatment:                                                DATE: 07/16/23 Therapeutic Exercise: *** Manual Therapy: *** Neuromuscular re-ed: *** Therapeutic Activity: *** Modalities: *** Self Care: ***     PATIENT EDUCATION:  Education details: *** Person educated: {Person educated:25204} Education method: {Education Method:25205} Education comprehension: {Education Comprehension:25206}  HOME EXERCISE PROGRAM: Access Code: [email or web address] URL: [email or web address] Date: 07/16/2023 Prepared by: Joellyn Rued  Exercises - Long Sitting Quad Set (Mirrored)  - 3 x daily - 7 x weekly - 1 sets - 10 reps - 5 hold - Supine Heel Slide with Strap  - 3 x daily - 7 x weekly - 1 sets - 10 reps - 10 hold - Supine Knee Extension Strengthening (Mirrored)  - 3 x daily - 7 x weekly - 1 sets - 10 reps - 3 hold - Supine Hip Abduction AROM (Mirrored)  - 3 x daily - 7 x weekly - 1 sets - 10 reps - 3 hold  ASSESSMENT:  CLINICAL IMPRESSION: Patient is a 77 y.o. female who was seen today for physical therapy evaluation and treatment for M17.11 (ICD-10-CM) - Unilateral primary osteoarthritis, right knee . RIGHT TOTAL KNEE ARTHROPLASTY on 07/12/2023   OBJECTIVE IMPAIRMENTS: {opptimpairments:25111}.    ACTIVITY LIMITATIONS: {activitylimitations:27494}  PARTICIPATION LIMITATIONS: {participationrestrictions:25113}  PERSONAL FACTORS: {Personal factors:25162} are also affecting patient's functional outcome.   REHAB POTENTIAL: {rehabpotential:25112}  CLINICAL DECISION MAKING: {clinical decision making:25114}  EVALUATION COMPLEXITY: {Evaluation complexity:25115}   GOALS: Goals reviewed with patient? {yes/no:20286}  SHORT TERM GOALS: Target date: *** *** Baseline: Goal status: INITIAL  2.  *** Baseline:  Goal status: INITIAL  3.  *** Baseline:  Goal status: INITIAL  4.  *** Baseline:  Goal status: INITIAL  5.  *** Baseline:  Goal status: INITIAL  6.  *** Baseline:  Goal status: INITIAL  LONG TERM GOALS: Target date: ***  *** Baseline:  Goal status: INITIAL  2.  *** Baseline:  Goal status: INITIAL  3.  *** Baseline:  Goal status: INITIAL  4.  *** Baseline:  Goal status: INITIAL  5.  *** Baseline:  Goal status: INITIAL  6.  *** Baseline:  Goal status: INITIAL   PLAN:  PT FREQUENCY: {rehab frequency:25116}  PT DURATION: {rehab duration:25117}  PLANNED INTERVENTIONS: {rehab planned interventions:25118::"97110-Therapeutic exercises","97530- Therapeutic [phone]- Neuromuscular re-education","97535- Self MWUX","32440- Manual therapy"}  PLAN FOR NEXT SESSION: ***   Michiah Masse, PT 07/16/2023, 4:04 PM

## 2023-07-16 NOTE — Progress Notes (Signed)
Care Coordination Call  Discussed patient assistance needs with patient. Approved for Synthroid Denyse Amass) and Jardiance (BI) assistance, needs to reapply for 2025.   Will collaborate with CPhT team to mail re-enrollment paperwork.   Catie Eppie Gibson, PharmD, BCACP, CPP Clinical Pharmacist [facility] [phone]

## 2023-07-16 NOTE — Telephone Encounter (Signed)
Last Fill: 04/20/2023  Eye exam: 12/24/2022 WNL   Labs: 05/26/2023 Glucose is 128.  Creatinine is elevated-1.57 and GFR is low-34. Total urine protein remains elevated.  Protein creatinine ratio is borderline elevated   Next Visit: 09/08/2023  Last Visit: 03/16/2023  DX: Other systemic lupus erythematosus with other organ involvement   Current Dose per office note 03/10/2023: Hydroxychloroquine level is elevated-1,200.  Recommend reducing plaquenil to 1 tablet in the morning and half tablet in the evening Monday through Friday only.   Okay to refill Plaquenil?

## 2023-07-18 DIAGNOSIS — Z8616 Personal history of COVID-19: Secondary | ICD-10-CM | POA: Insufficient documentation

## 2023-07-18 DIAGNOSIS — E7800 Pure hypercholesterolemia, unspecified: Secondary | ICD-10-CM | POA: Insufficient documentation

## 2023-07-18 NOTE — Assessment & Plan Note (Signed)
Chronic, we discussed use of cardiac calcium scoring to further evaluate cardiac risk. She is aware of $99 fee.

## 2023-07-18 NOTE — Assessment & Plan Note (Signed)
Chronic, well controlled. She is reminded to follow low sodium diet. No med changes today.

## 2023-07-18 NOTE — Assessment & Plan Note (Signed)
I will send rx benzonatate perles to use prn for lingering cough. Encouraged to avoid dairy.

## 2023-07-18 NOTE — Assessment & Plan Note (Signed)
She is on Eliquis due to PAF.

## 2023-07-18 NOTE — Assessment & Plan Note (Signed)
Chronic, sx are stable.

## 2023-07-18 NOTE — Assessment & Plan Note (Addendum)
Chronic, she had pre-op labs performed on 10/7  - results reviewed. A1c is 5.4. No med changes needed. She is currently on Jardiance 10mg  daily.

## 2023-07-18 NOTE — Assessment & Plan Note (Signed)
She is encouraged to strive for BMI less than 30 to decrease cardiac risk. Advised to aim for at least 150 minutes of exercise per week.

## 2023-07-19 NOTE — Telephone Encounter (Signed)
Patient is approved till 09/28/23 so renewal will be decided on after current term ends. Decision should be expected in January 2025. Renewal app received by company, they will decide on renewal come January.

## 2023-07-20 ENCOUNTER — Ambulatory Visit: Payer: Medicare HMO

## 2023-07-20 DIAGNOSIS — M6281 Muscle weakness (generalized): Secondary | ICD-10-CM | POA: Diagnosis not present

## 2023-07-20 DIAGNOSIS — G8929 Other chronic pain: Secondary | ICD-10-CM

## 2023-07-20 DIAGNOSIS — R262 Difficulty in walking, not elsewhere classified: Secondary | ICD-10-CM

## 2023-07-20 DIAGNOSIS — M25561 Pain in right knee: Secondary | ICD-10-CM | POA: Diagnosis not present

## 2023-07-20 NOTE — Therapy (Signed)
OUTPATIENT PHYSICAL THERAPY LOWER EXTREMITY EVALUATION   Patient Name: Hannah [name] MRN: [id number] DOB:[birth year], 77 y.o., female Today's Date: 07/20/2023  END OF SESSION:  PT End of Session - 07/20/23 1642     Visit Number 2    Number of Visits 17    Date for PT Re-Evaluation 09/17/23    Authorization Type [employer] H[employer]    PT Start Time 1635    PT Stop Time 1725    PT Time Calculation (min) 50 min    Equipment Utilized During Treatment Other (comment)   RW   Activity Tolerance Patient tolerated treatment well;Patient limited by pain    Behavior During Therapy WFL for tasks assessed/performed              Past Medical History:  Diagnosis Date   Allergy 25 years ago.   Anemia    Arthritis    Asthma    Atrial fibrillation (HCC)    Cataract Had cataracts surgery 2015 & 2016   Chronic kidney disease    Diabetes mellitus without complication (HCC)    Gout 12/17/2014   patient reported   Heart murmur 4 years ago   Hyperlipidemia    Hypertension    Hypothyroidism    Systemic lupus erythematosus (HCC)    Vitiligo    Past Surgical History:  Procedure Laterality Date   CATARACT EXTRACTION Bilateral 2015   DILATION AND CURETTAGE OF UTERUS     DOPPLER ECHOCARDIOGRAPHY  05/2018   Internist to review with pt; potential heart murmur 06/20/18   EYE SURGERY  Cataract Surgery, 2015 & 2016.   keratosis removal  2021   KNEE CLOSED REDUCTION Left 07/06/2022   Procedure: CLOSED MANIPULATION KNEE;  Surgeon: Ollen Gross, MD;  Location: [facility];  Service: Orthopedics;  Laterality: Left;   REPLACEMENT TOTAL KNEE Left    SKIN SURGERY  11/30/2018   left side of face   TOOTH EXTRACTION     TOTAL KNEE ARTHROPLASTY Left 02/02/2022   Procedure: TOTAL KNEE ARTHROPLASTY;  Surgeon: Ollen Gross, MD;  Location: [facility];  Service: Orthopedics;  Laterality: Left;   TOTAL KNEE ARTHROPLASTY Right 07/12/2023   Procedure: RIGHT TOTAL KNEE ARTHROPLASTY;  Surgeon: Ollen Gross, MD;  Location: [facility];  Service: Orthopedics;  Laterality: Right;   Patient Active Problem List   Diagnosis Date Noted   Pure hypercholesterolemia 07/18/2023   Personal history of COVID-19 07/18/2023   Primary osteoarthritis of right knee 07/12/2023   Acquired thrombophilia (HCC) 01/12/2023   Arthrofibrosis of total knee arthroplasty (HCC) 07/06/2022   OA (osteoarthritis) of knee 02/02/2022   Primary osteoarthritis of left knee 02/02/2022   Chronic bilateral low back pain without sciatica 06/25/2021   Paresthesia and pain of extremity 06/25/2021   Atrial fibrillation (HCC) 04/16/2021   Demand ischemia (HCC) 03/26/2021   Unspecified atrial fibrillation (HCC) 03/26/2021   COVID-19 virus infection 03/26/2021   PVC (premature ventricular contraction) 05/06/2020   Type 2 diabetes mellitus with stage 3 chronic kidney disease, without long-term current use of insulin (HCC) 03/20/2020   Palpitations 03/20/2020   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   Primary hypothyroidism 03/20/2020   Vitiligo 03/20/2020   Class 2 severe obesity due to excess calories with serious comorbidity and body mass index (BMI) of 35.0 to 35.9 in adult (HCC) 10/31/2019   Iron deficiency anemia 08/12/2017   Osteoporosis 10/02/2016   Systemic lupus erythematosus (HCC) 10/02/2016   Rheumatoid arthritis involving multiple sites with positive rheumatoid factor (HCC) 10/02/2016  High risk medication use 10/02/2016   History of chronic kidney disease 10/02/2016   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/2016   Primary osteoarthritis of both knees 10/02/2016   Vitamin D deficiency 10/02/2016   History of diabetes mellitus 10/02/2016   History of hypertension 10/02/2016   History of asthma 10/02/2016   Absolute anemia    MGUS (monoclonal gammopathy of unknown significance)    Normocytic anemia 03/21/2015   Essential hypertension 03/20/2015   Abnormal CT of the chest 07/30/2012    Asthma 06/19/2012    PCP: Dorothyann Peng, MD  REFERRING PROVIDER: Ollen Gross, MD   REFERRING DIAG: M17.11 (ICD-10-CM) - Unilateral primary osteoarthritis, right knee   THERAPY DIAG:  Chronic pain of right knee  Muscle weakness (generalized)  Difficulty in walking, not elsewhere classified  Rationale for Evaluation and Treatment: Rehabilitation  ONSET DATE: 07/12/23  SUBJECTIVE:   SUBJECTIVE STATEMENT: Pt reports she has been consistent with the completion of her HEP. Pt reports she is having neck, low back, and l leg pain walking increased strain favoring her R knee.  EVAL: Pt reports she has been completing her HEP 1x daily and using cold packs on a frequent basis for pain and swelling management.  PAIN:  Are you having pain? Yes: NPRS scale: 7/10; pain range 5-10/10 Pain location: R knee Pain description: ache ,throb ,sharp Aggravating factors: movement, lifting, wt bearing Relieving factors: Pain medication, muscle relaxer, cold packs  PERTINENT HISTORY: High BMI, L LKA, Afib  PRECAUTIONS: Knee  RED FLAGS: None   WEIGHT BEARING RESTRICTIONS: No  FALLS:  Has patient fallen in last 6 months? No  LIVING ENVIRONMENT: Lives with: lives alone Lives in: House/apartment Stairs: No Has following equipment at home: Single point cane, Environmental consultant - 2 wheeled, Wheelchair (manual), and bed side commode  OCCUPATION: Retired  PLOF: Independent with household mobility with device  PATIENT GOALS: good use of my R knee  NEXT MD VISIT: 07/27/23  OBJECTIVE:  Note: Objective measures were completed at Evaluation unless otherwise noted.  DIAGNOSTIC FINDINGS: None for the R knee in Epic  PATIENT SURVEYS:  FOTO: Perceived function   22%, predicted   43%   COGNITION: Overall cognitive status: Within functional limits for tasks assessed     SENSATION: WFL  EDEMA:  Significant R knee edema present   MUSCLE LENGTH: Hamstrings: Right NT deg; Left NT deg Maisie Fus  test: Right NT deg; Left NT deg  POSTURE: rounded shoulders, forward head, and anterior pelvic tilt  PALPATION: TTP to the R peri-knee  LOWER EXTREMITY ROM:  Active ROM Right eval Left eval Rt 07/20/23  Hip flexion     Hip extension     Hip abduction     Hip adduction     Hip internal rotation     Hip external rotation     Knee flexion 50  70  Knee extension 30  30  Ankle dorsiflexion     Ankle plantarflexion     Ankle inversion     Ankle eversion      (Blank rows = not tested)  LOWER EXTREMITY MMT:  MMT Right eval Left eval  Hip flexion 2   Hip extension 2   Hip abduction 2   Hip adduction    Hip internal rotation    Hip external rotation 2   Knee flexion 2   Knee extension 2   Ankle dorsiflexion    Ankle plantarflexion    Ankle inversion    Ankle  eversion     (Blank rows = not tested)  LOWER EXTREMITY SPECIAL TESTS:  NT  FUNCTIONAL TESTS:  5 times sit to stand: TBA 2 minute walk test: TBA  GAIT: Distance walked: 39ft Assistive device utilized: Environmental consultant - 2 wheeled Level of assistance: SBA Comments: Antalgic over the R LE  TRANSFER: Mod A for mat table transfer c A for the R LE   TODAY'S TREATMENT:  [facility] Adult PT Treatment:                                                DATE: 07/20/23 Therapeutic Exercise: Seated heal slides, flexion/ext, 2x10 Supine heal slides c strap assist, x15, 10 sec holds Quad sets x10 SAQ 2x10 AA hip abd c strap Manual Therapy: Gentle knee distraction combined with R leg ext Therapeutic Activity: Gait training c RW x36ft x2. Antalgic gait pattern. Modalities: Cold pack x10 mins to R knee Self Care: Pt Ed re: signs and symptoms of infection. Increased redness, warm, swelling and pain. Pt is to call if her current condition worsens or if has a temperature greater than 101d.                                                                                                                              [facility] Adult PT  Treatment:                                                DATE: 07/16/23 Therapeutic Exercise: Developed, instructed in, and pt completed therex as noted in HEP. Recommended pt completing her HEP x3 daily  Self Care: RICE periodically for 15 mins for symptom management   PATIENT EDUCATION:  Education details: Eval findings, POC, HEP, self care  Person educated: Patient Education method: Explanation, Demonstration, Tactile cues, Verbal cues, and Handouts Education comprehension: verbalized understanding, returned demonstration, verbal cues required, and tactile cues required  HOME EXERCISE PROGRAM: Access Code: [email or web address] URL: [email or web address] Date: 07/20/2023 Prepared by: Joellyn Rued  Exercises - Long Sitting Quad Set (Mirrored)  - 3 x daily - 7 x weekly - 1 sets - 10 reps - 5 hold - Supine Heel Slide with Strap  - 3 x daily - 7 x weekly - 1 sets - 10 reps - 10 hold - Supine Knee Extension Strengthening (Mirrored)  - 3 x daily - 7 x weekly - 1 sets - 10 reps - 3 hold - Supine Hip Abduction AROM (Mirrored)  - 3 x daily - 7 x weekly - 1 sets - 10 reps - 3 hold - Seated Knee Flexion Slide  - 3 x daily - 7 x weekly - 1 sets - 10 reps - 10  hold  ASSESSMENT:  CLINICAL IMPRESSION: Pt's R knee is somewhat warm and light redness. Self care provided signs and symptoms of infection and when to contact MD. AROM for flexion has improved since the eval, while ext continues to be very limited. Due to compensations and favoring the the R knee, pt has develop neck, back, and L LE pain. Pt's post TKA progress is limited due to markedly limited knee ext and pain. Will continue to assess for signs and symptoms for infection. Pt will continue to benefit from skilled PT to address impairments for improved function.   Eval: Patient is a 77 y.o. female who was seen today for physical therapy evaluation and treatment for M17.11 (ICD-10-CM) - Unilateral primary osteoarthritis, right knee .  RIGHT TOTAL KNEE ARTHROPLASTY on 07/12/2023. Pt presents with markedly decreased R knee AROM and strength, and decreased functional mobility. Pt will benefit from skilled PT 2w8 to address impairments to optimize R LE/knee function with less pain.   OBJECTIVE IMPAIRMENTS: decreased activity tolerance, decreased mobility, difficulty walking, decreased ROM, decreased strength, increased edema, obesity, and pain.   ACTIVITY LIMITATIONS: carrying, lifting, bending, sitting, standing, squatting, sleeping, stairs, transfers, bed mobility, bathing, toileting, dressing, locomotion level, and caring for others  PARTICIPATION LIMITATIONS: meal prep, cleaning, laundry, and driving  PERSONAL FACTORS: Age, Fitness, Past/current experiences, Time since onset of injury/illness/exacerbation, and 1 comorbidity: high BMI  are also affecting patient's functional outcome.   REHAB POTENTIAL: Good  CLINICAL DECISION MAKING: Stable/uncomplicated  EVALUATION COMPLEXITY: Low   GOALS:  SHORT TERM GOALS: Target date: 08/06/23 Pt will be Ind in an initial HEP  Baseline: started Goal status: INITIAL  2.  Pt will be able to complete a SLR and SAQ with or without quad lag Baseline: notable to complete Goal status: INITIAL  3.  Increase R knee AROM to 15-80d for improved R knee function Baseline: 30-50 Goal status: INITIAL  LONG TERM GOALS: Target date: 09/17/23  Pt will be Ind in a final HEP to maintain achieved LOF Baseline: started Goal status: INITIAL  2.  Increase R hip and knee strength to 4/5 for improved function use of the R LE Baseline: see flow sheets Goal status: INITIAL  3.  Increased R knee AROM to 5-115d for appropriate fuctional mobility Baseline: 30-50d Goal status: INITIAL  4.  Improve 5xSTS by MCID of 5" and by MCID of 24ft as indication of improved functional mobility  Baseline: TBA when pt has progressed to gait c a SPC Goal status: INITIAL  5.  Pt's FOTO score will  improved to the predicted value of 43% as indication of improved function  Baseline: 22% Goal status: INITIAL  6.  Pt will be able to walk 420ft c or s a SPC and ascend/descend 12 steps c or s a SPC and c use of a HR for community mobility Baseline: 52ft c RW Goal status: INITIAL   PLAN:  PT FREQUENCY: 2x/week  PT DURATION: 8 weeks  PLANNED INTERVENTIONS: 97164- PT Re-evaluation, 97110-Therapeutic exercises, 97530- Therapeutic activity, 97535- Self Care, 16109- Manual therapy, [id number]- Gait training, 97014- Electrical stimulation (unattended), [id number]- Electrical stimulation (manual), 97016- Vasopneumatic device, Stair training, Dry Needling, Joint mobilization, Cryotherapy, and Moist heat  PLAN FOR NEXT SESSION: Review FOTO; assess response to HEP; progress therex as indicated; use of modalities, manual therapy; and TPDN as indicated.   Lillianne Eick MS, PT 07/20/23 5:22 PM

## 2023-07-20 NOTE — Therapy (Signed)
OUTPATIENT PHYSICAL THERAPY LOWER EXTREMITY EVALUATION   Patient Name: Hannah [name] MRN: [id number] DOB:[birth year], 77 y.o., female Today's Date: 07/22/2023  END OF SESSION:  PT End of Session - 07/22/23 1649     Visit Number 3    Number of Visits 17    Date for PT Re-Evaluation 09/17/23    Authorization Type [employer]    PT Start Time 1635    PT Stop Time 1720    PT Time Calculation (min) 45 min    Equipment Utilized During Treatment Other (comment)   RW   Activity Tolerance Patient tolerated treatment well;Patient limited by pain    Behavior During Therapy WFL for tasks assessed/performed               Past Medical History:  Diagnosis Date   Allergy 25 years ago.   Anemia    Arthritis    Asthma    Atrial fibrillation (HCC)    Cataract Had cataracts surgery 2015 & 2016   Chronic kidney disease    Diabetes mellitus without complication (HCC)    Gout 12/17/2014   patient reported   Heart murmur 4 years ago   Hyperlipidemia    Hypertension    Hypothyroidism    Systemic lupus erythematosus (HCC)    Vitiligo    Past Surgical History:  Procedure Laterality Date   CATARACT EXTRACTION Bilateral 2015   DILATION AND CURETTAGE OF UTERUS     DOPPLER ECHOCARDIOGRAPHY  05/2018   Internist to review with pt; potential heart murmur 06/20/18   EYE SURGERY  Cataract Surgery, 2015 & 2016.   keratosis removal  2021   KNEE CLOSED REDUCTION Left 07/06/2022   Procedure: CLOSED MANIPULATION KNEE;  Surgeon: Ollen Gross, MD;  Location: [facility];  Service: Orthopedics;  Laterality: Left;   REPLACEMENT TOTAL KNEE Left    SKIN SURGERY  11/30/2018   left side of face   TOOTH EXTRACTION     TOTAL KNEE ARTHROPLASTY Left 02/02/2022   Procedure: TOTAL KNEE ARTHROPLASTY;  Surgeon: Ollen Gross, MD;  Location: [facility];  Service: Orthopedics;  Laterality: Left;   TOTAL KNEE ARTHROPLASTY Right 07/12/2023   Procedure: RIGHT TOTAL KNEE ARTHROPLASTY;  Surgeon: Ollen Gross, MD;  Location: [facility];  Service: Orthopedics;  Laterality: Right;   Patient Active Problem List   Diagnosis Date Noted   Pure hypercholesterolemia 07/18/2023   Personal history of COVID-19 07/18/2023   Primary osteoarthritis of right knee 07/12/2023   Acquired thrombophilia (HCC) 01/12/2023   Arthrofibrosis of total knee arthroplasty (HCC) 07/06/2022   OA (osteoarthritis) of knee 02/02/2022   Primary osteoarthritis of left knee 02/02/2022   Chronic bilateral low back pain without sciatica 06/25/2021   Paresthesia and pain of extremity 06/25/2021   Atrial fibrillation (HCC) 04/16/2021   Demand ischemia (HCC) 03/26/2021   Unspecified atrial fibrillation (HCC) 03/26/2021   COVID-19 virus infection 03/26/2021   PVC (premature ventricular contraction) 05/06/2020   Type 2 diabetes mellitus with stage 3 chronic kidney disease, without long-term current use of insulin (HCC) 03/20/2020   Palpitations 03/20/2020   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   Primary hypothyroidism 03/20/2020   Vitiligo 03/20/2020   Class 2 severe obesity due to excess calories with serious comorbidity and body mass index (BMI) of 35.0 to 35.9 in adult (HCC) 10/31/2019   Iron deficiency anemia 08/12/2017   Osteoporosis 10/02/2016   Systemic lupus erythematosus (HCC) 10/02/2016   Rheumatoid arthritis involving multiple sites with positive rheumatoid factor (HCC)  10/02/2016   High risk medication use 10/02/2016   History of chronic kidney disease 10/02/2016   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/2016   Primary osteoarthritis of both knees 10/02/2016   Vitamin D deficiency 10/02/2016   History of diabetes mellitus 10/02/2016   History of hypertension 10/02/2016   History of asthma 10/02/2016   Absolute anemia    MGUS (monoclonal gammopathy of unknown significance)    Normocytic anemia 03/21/2015   Essential hypertension 03/20/2015   Abnormal CT of the chest 07/30/2012    Asthma 06/19/2012    PCP: Dorothyann Peng, MD  REFERRING PROVIDER: Ollen Gross, MD   REFERRING DIAG: M17.11 (ICD-10-CM) - Unilateral primary osteoarthritis, right knee   THERAPY DIAG:  Chronic pain of right knee  Muscle weakness (generalized)  Difficulty in walking, not elsewhere classified  Rationale for Evaluation and Treatment: Rehabilitation  ONSET DATE: 07/12/23  SUBJECTIVE:   SUBJECTIVE STATEMENT: Pt reports she has been able to get in/out of bed on her own  EVAL: Pt reports she has been completing her HEP 1x daily and using cold packs on a frequent basis for pain and swelling management.  PAIN:  Are you having pain? Yes: NPRS scale: 7/10; pain range 5-10/10 Pain location: R knee Pain description: ache ,throb ,sharp Aggravating factors: movement, lifting, wt bearing Relieving factors: Pain medication, muscle relaxer, cold packs  PERTINENT HISTORY: High BMI, L LKA, Afib  PRECAUTIONS: Knee  RED FLAGS: None   WEIGHT BEARING RESTRICTIONS: No  FALLS:  Has patient fallen in last 6 months? No  LIVING ENVIRONMENT: Lives with: lives alone Lives in: House/apartment Stairs: No Has following equipment at home: Single point cane, Environmental consultant - 2 wheeled, Wheelchair (manual), and bed side commode  OCCUPATION: Retired  PLOF: Independent with household mobility with device  PATIENT GOALS: good use of my R knee  NEXT MD VISIT: 07/27/23  OBJECTIVE:  Note: Objective measures were completed at Evaluation unless otherwise noted.  DIAGNOSTIC FINDINGS: None for the R knee in Epic  PATIENT SURVEYS:  FOTO: Perceived function   22%, predicted   43%   COGNITION: Overall cognitive status: Within functional limits for tasks assessed     SENSATION: WFL  EDEMA:  Significant R knee edema present   MUSCLE LENGTH: Hamstrings: Right NT deg; Left NT deg Thomas test: Right NT deg; Left NT deg  POSTURE: rounded shoulders, forward head, and anterior pelvic  tilt  PALPATION: TTP to the R peri-knee  LOWER EXTREMITY ROM:  Active ROM Right eval Left eval Rt 07/20/23 Rt 07/22/23  Hip flexion      Hip extension      Hip abduction      Hip adduction      Hip internal rotation      Hip external rotation      Knee flexion 50  70 AA 80  Knee extension 30  30 30   Ankle dorsiflexion      Ankle plantarflexion      Ankle inversion      Ankle eversion       (Blank rows = not tested)  LOWER EXTREMITY MMT:  MMT Right eval Left eval  Hip flexion 2   Hip extension 2   Hip abduction 2   Hip adduction    Hip internal rotation    Hip external rotation 2   Knee flexion 2   Knee extension 2   Ankle dorsiflexion    Ankle plantarflexion    Ankle inversion  Ankle eversion     (Blank rows = not tested)  LOWER EXTREMITY SPECIAL TESTS:  NT  FUNCTIONAL TESTS:  5 times sit to stand: TBA 2 minute walk test: TBA  GAIT: Distance walked: 68ft Assistive device utilized: Environmental consultant - 2 wheeled Level of assistance: SBA Comments: Antalgic over the R LE  TRANSFER: Mod A for mat table transfer c A for the R LE   TODAY'S TREATMENT:  [facility] Adult PT Treatment:                                                DATE: 07/22/23 Therapeutic Exercise: Seated heal slides, flexion/ext, 2x10, 15 sec hold c PT assist Supine heal slides c strap assist, x15, 10 sec hold c PT assist Quad sets x10 10 sec hold c PT assist SAQ 2x10 SLR x10 hip abd x10 Manual Therapy: Gentle LAD distraction combined with R knee ext Therapeutic Activity: Gait training c RW x57ft x2. Antalgic gait pattern c step to gait  [facility] Adult PT Treatment:                                                DATE: 07/20/23 Therapeutic Exercise: Seated heal slides, flexion/ext, 2x10 Supine heal slides c strap assist, x15, 10 sec holds Quad sets x10 SAQ 2x10 AA hip abd c strap Manual Therapy: Gentle knee distraction combined with R leg ext Therapeutic Activity: Gait training c RW x75ft x2.  Antalgic gait pattern. Modalities: Cold pack x10 mins to R knee Self Care: Pt Ed re: signs and symptoms of infection. Increased redness, warm, swelling and pain. Pt is to call if her current condition worsens or if has a temperature greater than 101d.                                                                                                                              [facility] Adult PT Treatment:                                                DATE: 07/16/23 Therapeutic Exercise: Developed, instructed in, and pt completed therex as noted in HEP. Recommended pt completing her HEP x3 daily  Self Care: RICE periodically for 15 mins for symptom management   PATIENT EDUCATION:  Education details: Eval findings, POC, HEP, self care  Person educated: Patient Education method: Explanation, Demonstration, Tactile cues, Verbal cues, and Handouts Education comprehension: verbalized understanding, returned demonstration, verbal cues required, and tactile cues required  HOME EXERCISE PROGRAM: Access Code: [email or web address] URL: [email or web address] Date: 07/20/2023 Prepared by:  Jaking Thayer  Exercises - Long Sitting Quad Set (Mirrored)  - 3 x daily - 7 x weekly - 1 sets - 10 reps - 5 hold - Supine Heel Slide with Strap  - 3 x daily - 7 x weekly - 1 sets - 10 reps - 10 hold - Supine Knee Extension Strengthening (Mirrored)  - 3 x daily - 7 x weekly - 1 sets - 10 reps - 3 hold - Supine Hip Abduction AROM (Mirrored)  - 3 x daily - 7 x weekly - 1 sets - 10 reps - 3 hold - Seated Knee Flexion Slide  - 3 x daily - 7 x weekly - 1 sets - 10 reps - 10 hold  ASSESSMENT:  CLINICAL IMPRESSION: Pt is demonstrating improved function with improved pace with walking and being able to complete a sit to supine transfer without assist. R knee AAROM flexion continues to make gains, while ext has not made any progress limited by significant stiffness and pain at end range. Pt reports consistent completion of  her HEP. Pt tolerated PT today without adverse effects.Pt is going to use a cold pack at home. Pt will continue to benefit from skilled PT to address impairments for improved R knee function.    Eval: Patient is a 77 y.o. female who was seen today for physical therapy evaluation and treatment for M17.11 (ICD-10-CM) - Unilateral primary osteoarthritis, right knee . RIGHT TOTAL KNEE ARTHROPLASTY on 07/12/2023. Pt presents with markedly decreased R knee AROM and strength, and decreased functional mobility. Pt will benefit from skilled PT 2w8 to address impairments to optimize R LE/knee function with less pain.   OBJECTIVE IMPAIRMENTS: decreased activity tolerance, decreased mobility, difficulty walking, decreased ROM, decreased strength, increased edema, obesity, and pain.   ACTIVITY LIMITATIONS: carrying, lifting, bending, sitting, standing, squatting, sleeping, stairs, transfers, bed mobility, bathing, toileting, dressing, locomotion level, and caring for others  PARTICIPATION LIMITATIONS: meal prep, cleaning, laundry, and driving  PERSONAL FACTORS: Age, Fitness, Past/current experiences, Time since onset of injury/illness/exacerbation, and 1 comorbidity: high BMI  are also affecting patient's functional outcome.   REHAB POTENTIAL: Good  CLINICAL DECISION MAKING: Stable/uncomplicated  EVALUATION COMPLEXITY: Low   GOALS:  SHORT TERM GOALS: Target date: 08/06/23 Pt will be Ind in an initial HEP  Baseline: started Goal status: INITIAL  2.  Pt will be able to complete a SLR and SAQ with or without quad lag Baseline: notable to complete Goal status: INITIAL  3.  Increase R knee AROM to 15-80d for improved R knee function Baseline: 30-50 Goal status: INITIAL  LONG TERM GOALS: Target date: 09/17/23  Pt will be Ind in a final HEP to maintain achieved LOF Baseline: started Goal status: INITIAL  2.  Increase R hip and knee strength to 4/5 for improved function use of the R LE Baseline:  see flow sheets Goal status: INITIAL  3.  Increased R knee AROM to 5-115d for appropriate fuctional mobility Baseline: 30-50d Goal status: INITIAL  4.  Improve 5xSTS by MCID of 5" and by MCID of 84ft as indication of improved functional mobility  Baseline: TBA when pt has progressed to gait c a SPC Goal status: INITIAL  5.  Pt's FOTO score will improved to the predicted value of 43% as indication of improved function  Baseline: 22% Goal status: INITIAL  6.  Pt will be able to walk 433ft c or s a SPC and ascend/descend 12 steps c or s a SPC and c use of  a HR for community mobility Baseline: 53ft c RW Goal status: INITIAL   PLAN:  PT FREQUENCY: 2x/week  PT DURATION: 8 weeks  PLANNED INTERVENTIONS: 97164- PT Re-evaluation, 97110-Therapeutic exercises, 97530- Therapeutic activity, 97535- Self Care, 56213- Manual therapy, [id number]- Gait training, 97014- Electrical stimulation (unattended), [id number]- Electrical stimulation (manual), 97016- Vasopneumatic device, Stair training, Dry Needling, Joint mobilization, Cryotherapy, and Moist heat  PLAN FOR NEXT SESSION: Review FOTO; assess response to HEP; progress therex as indicated; use of modalities, manual therapy; and TPDN as indicated.   Breanna Mcdaniel MS, PT 07/22/23 5:51 PM

## 2023-07-22 ENCOUNTER — Ambulatory Visit: Payer: Medicare HMO

## 2023-07-22 DIAGNOSIS — M6281 Muscle weakness (generalized): Secondary | ICD-10-CM | POA: Diagnosis not present

## 2023-07-22 DIAGNOSIS — R262 Difficulty in walking, not elsewhere classified: Secondary | ICD-10-CM

## 2023-07-22 DIAGNOSIS — G8929 Other chronic pain: Secondary | ICD-10-CM

## 2023-07-22 DIAGNOSIS — M25561 Pain in right knee: Secondary | ICD-10-CM | POA: Diagnosis not present

## 2023-07-26 ENCOUNTER — Other Ambulatory Visit (HOSPITAL_BASED_OUTPATIENT_CLINIC_OR_DEPARTMENT_OTHER): Payer: Self-pay

## 2023-07-27 ENCOUNTER — Ambulatory Visit: Payer: Medicare HMO

## 2023-07-27 DIAGNOSIS — R262 Difficulty in walking, not elsewhere classified: Secondary | ICD-10-CM | POA: Diagnosis not present

## 2023-07-27 DIAGNOSIS — M6281 Muscle weakness (generalized): Secondary | ICD-10-CM

## 2023-07-27 DIAGNOSIS — G8929 Other chronic pain: Secondary | ICD-10-CM

## 2023-07-27 DIAGNOSIS — M25561 Pain in right knee: Secondary | ICD-10-CM | POA: Diagnosis not present

## 2023-07-27 NOTE — Therapy (Signed)
OUTPATIENT PHYSICAL THERAPY LOWER EXTREMITY EVALUATION   Patient Name: Hannah [name] MRN: [id number] DOB:09-Apr-1946, 77 y.o., female Today's Date: 07/27/2023  END OF SESSION:  PT End of Session - 07/27/23 1546     Visit Number 4    Number of Visits 17    Date for PT Re-Evaluation 09/17/23    Authorization Type [employer]IC[employer]    PT Start Time 1545    PT Stop Time 1630    PT Time Calculation (min) 45 min    Equipment Utilized During Treatment Other (comment)   RW   Activity Tolerance Patient tolerated treatment well;Patient limited by pain    Behavior During Therapy WFL for tasks assessed/performed                Past Medical History:  Diagnosis Date   Allergy 25 years ago.   Anemia    Arthritis    Asthma    Atrial fibrillation (HCC)    Cataract Had cataracts surgery 2015 & 2016   Chronic kidney disease    Diabetes mellitus without complication (HCC)    Gout 12/17/2014   patient reported   Heart murmur 4 years ago   Hyperlipidemia    Hypertension    Hypothyroidism    Systemic lupus erythematosus (HCC)    Vitiligo    Past Surgical History:  Procedure Laterality Date   CATARACT EXTRACTION Bilateral 2015   DILATION AND CURETTAGE OF UTERUS     DOPPLER ECHOCARDIOGRAPHY  05/2018   Internist to review with pt; potential heart murmur 06/20/18   EYE SURGERY  Cataract Surgery, 2015 & 2016.   keratosis removal  2021   KNEE CLOSED REDUCTION Left 07/06/2022   Procedure: CLOSED MANIPULATION KNEE;  Surgeon: Ollen Gross, MD;  Location: [facility];  Service: Orthopedics;  Laterality: Left;   REPLACEMENT TOTAL KNEE Left    SKIN SURGERY  11/30/2018   left side of face   TOOTH EXTRACTION     TOTAL KNEE ARTHROPLASTY Left 02/02/2022   Procedure: TOTAL KNEE ARTHROPLASTY;  Surgeon: Ollen Gross, MD;  Location: [facility];  Service: Orthopedics;  Laterality: Left;   TOTAL KNEE ARTHROPLASTY Right 07/12/2023   Procedure: RIGHT TOTAL KNEE ARTHROPLASTY;  Surgeon: Ollen Gross, MD;  Location: [facility];  Service: Orthopedics;  Laterality: Right;   Patient Active Problem List   Diagnosis Date Noted   Pure hypercholesterolemia 07/18/2023   Personal history of COVID-19 07/18/2023   Primary osteoarthritis of right knee 07/12/2023   Acquired thrombophilia (HCC) 01/12/2023   Arthrofibrosis of total knee arthroplasty (HCC) 07/06/2022   OA (osteoarthritis) of knee 02/02/2022   Primary osteoarthritis of left knee 02/02/2022   Chronic bilateral low back pain without sciatica 06/25/2021   Paresthesia and pain of extremity 06/25/2021   Atrial fibrillation (HCC) 04/16/2021   Demand ischemia (HCC) 03/26/2021   Unspecified atrial fibrillation (HCC) 03/26/2021   COVID-19 virus infection 03/26/2021   PVC (premature ventricular contraction) 05/06/2020   Type 2 diabetes mellitus with stage 3 chronic kidney disease, without long-term current use of insulin (HCC) 03/20/2020   Palpitations 03/20/2020   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   Primary hypothyroidism 03/20/2020   Vitiligo 03/20/2020   Class 2 severe obesity due to excess calories with serious comorbidity and body mass index (BMI) of 35.0 to 35.9 in adult (HCC) 10/31/2019   Iron deficiency anemia 08/12/2017   Osteoporosis 10/02/2016   Systemic lupus erythematosus (HCC) 10/02/2016   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) 10/02/2016   High risk medication use 10/02/2016   History of chronic kidney disease 10/02/2016   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/2016   Primary osteoarthritis of both knees 10/02/2016   Vitamin D deficiency 10/02/2016   History of diabetes mellitus 10/02/2016   History of hypertension 10/02/2016   History of asthma 10/02/2016   Absolute anemia    MGUS (monoclonal gammopathy of unknown significance)    Normocytic anemia 03/21/2015   Essential hypertension 03/20/2015   Abnormal CT of the chest 07/30/2012    Asthma 06/19/2012    PCP: Dorothyann Peng, MD  REFERRING PROVIDER: Ollen Gross, MD   REFERRING DIAG: M17.11 (ICD-10-CM) - Unilateral primary osteoarthritis, right knee   THERAPY DIAG:  Chronic pain of right knee  Muscle weakness (generalized)  Difficulty in walking, not elsewhere classified  Rationale for Evaluation and Treatment: Rehabilitation  ONSET DATE: 07/12/23  SUBJECTIVE:   SUBJECTIVE STATEMENT: Pt reports she has been consistent with the completion of her HEP.  EVAL: Pt reports she has been completing her HEP 1x daily and using cold packs on a frequent basis for pain and swelling management.  PAIN:  Are you having pain? Yes: NPRS scale: 5/10; pain range 5-10/10 Pain location: R knee Pain description: ache ,throb ,sharp Aggravating factors: movement, lifting, wt bearing Relieving factors: Pain medication, muscle relaxer, cold packs  PERTINENT HISTORY: High BMI, L LKA, Afib  PRECAUTIONS: Knee  RED FLAGS: None   WEIGHT BEARING RESTRICTIONS: No  FALLS:  Has patient fallen in last 6 months? No  LIVING ENVIRONMENT: Lives with: lives alone Lives in: House/apartment Stairs: No Has following equipment at home: Single point cane, Environmental consultant - 2 wheeled, Wheelchair (manual), and bed side commode  OCCUPATION: Retired  PLOF: Independent with household mobility with device  PATIENT GOALS: good use of my R knee  NEXT MD VISIT: 07/27/23  OBJECTIVE:  Note: Objective measures were completed at Evaluation unless otherwise noted.  DIAGNOSTIC FINDINGS: None for the R knee in Epic  PATIENT SURVEYS:  FOTO: Perceived function   22%, predicted   43%   COGNITION: Overall cognitive status: Within functional limits for tasks assessed     SENSATION: WFL  EDEMA:  Significant R knee edema present   MUSCLE LENGTH: Hamstrings: Right NT deg; Left NT deg Thomas test: Right NT deg; Left NT deg  POSTURE: rounded shoulders, forward head, and anterior pelvic  tilt  PALPATION: TTP to the R peri-knee  LOWER EXTREMITY ROM:  Active ROM Right eval Left eval Rt 07/20/23 Rt 07/22/23 RT 07/27/23  Hip flexion       Hip extension       Hip abduction       Hip adduction       Hip internal rotation       Hip external rotation       Knee flexion 50  70 AA 80 AA75  Knee extension 30  30 30 23   Ankle dorsiflexion       Ankle plantarflexion       Ankle inversion       Ankle eversion        (Blank rows = not tested)  LOWER EXTREMITY MMT:  MMT Right eval Left eval RT 07/27/23  Hip flexion 2  3 with limited ROM  Hip extension 2    Hip abduction 2    Hip adduction     Hip internal rotation     Hip external rotation 2    Knee  flexion 2    Knee extension 2  3 with limited ROM  Ankle dorsiflexion     Ankle plantarflexion     Ankle inversion     Ankle eversion      (Blank rows = not tested)  LOWER EXTREMITY SPECIAL TESTS:  NT  FUNCTIONAL TESTS:  5 times sit to stand: TBA 2 minute walk test: TBA  GAIT: Distance walked: 67ft Assistive device utilized: Environmental consultant - 2 wheeled Level of assistance: SBA Comments: Antalgic over the R LE  TRANSFER: Mod A for mat table transfer c A for the R LE   TODAY'S TREATMENT:  [facility] Adult PT Treatment:                                                DATE: 07/27/23 Therapeutic Exercise: Seated heal slides x20 Seated heal slides, flexion/ext, x5, 20 sec hold c pt assist Supine heal slides c strap assist, x15, 10 sec hold c PT assist Quad sets x10 10 sec hold c PT assist SAQ 2x10 SLR 2x10 Therapeutic Activity: Gait training: verbal cueing for heel to toe gait pattern Manual Therapy: Gentle LAD distraction combined with R knee ext  [facility] Adult PT Treatment:                                                DATE: 07/22/23 Therapeutic Exercise: Seated heal slides, flexion/ext, 2x10, 15 sec hold c PT assist Supine heal slides c strap assist, x15, 10 sec hold c PT assist Quad sets x10 10 sec hold c PT  assist SAQ 2x10 SLR x10 hip abd x10 Manual Therapy: Gentle LAD distraction combined with R knee ext Therapeutic Activity: Gait training c RW x64ft x2. Antalgic gait pattern c step to gait  [facility] Adult PT Treatment:                                                DATE: 07/20/23 Therapeutic Exercise: Seated heal slides, flexion/ext, 2x10 Supine heal slides c strap assist, x15, 10 sec holds Quad sets x10 SAQ 2x10 AA hip abd c strap Manual Therapy: Gentle knee distraction combined with R leg ext Therapeutic Activity: Gait training c RW x72ft x2. Antalgic gait pattern. Modalities: Cold pack x10 mins to R knee Self Care: Pt Ed re: signs and symptoms of infection. Increased redness, warm, swelling and pain. Pt is to call if her current condition worsens or if has a temperature greater than 101d.  [facility] Adult PT Treatment:                                                DATE: 07/16/23 Therapeutic Exercise: Developed, instructed in, and pt completed therex as noted in HEP. Recommended pt completing her HEP x3 daily  Self Care: RICE periodically for 15 mins for symptom management   PATIENT EDUCATION:  Education details: Eval findings, POC, HEP, self care  Person educated: Patient Education method: Explanation, Demonstration, Tactile cues, Verbal cues, and Handouts Education comprehension: verbalized understanding, returned demonstration, verbal cues required, and tactile cues required  HOME EXERCISE PROGRAM: Access Code: [email or web address] URL: [email or web address] Date: 07/20/2023 Prepared by: Joellyn Rued  Exercises - Long Sitting Quad Set (Mirrored)  - 3 x daily - 7 x weekly - 1 sets - 10 reps - 5 hold - Supine Heel Slide with Strap  - 3 x daily - 7 x weekly - 1 sets - 10 reps - 10 hold - Supine Knee Extension Strengthening (Mirrored)  - 3 x daily - 7 x  weekly - 1 sets - 10 reps - 3 hold - Supine Hip Abduction AROM (Mirrored)  - 3 x daily - 7 x weekly - 1 sets - 10 reps - 3 hold - Seated Knee Flexion Slide  - 3 x daily - 7 x weekly - 1 sets - 10 reps - 10 hold  ASSESSMENT:  CLINICAL IMPRESSION: Pt's functional mobility continues to improve. Pt's walking pace has increased and she is able to nearly step through c L LE when in R LE stance. Following verbal cueing, pt was able to walk c more of a heel toe pattern, considering her knee ROM limitations. R knee ROMs continue to be markedly limited, but knee ext demonstrated it's first gains today. Additionally, the R knee strength is improved within its limited motion for a SAQ and a SLR. Pt tolerated PT today without adverse effects. Pt will continue to benefit from skilled PT to address impairments for improved R knee/LE function.    Eval: Patient is a 77 y.o. female who was seen today for physical therapy evaluation and treatment for M17.11 (ICD-10-CM) - Unilateral primary osteoarthritis, right knee . RIGHT TOTAL KNEE ARTHROPLASTY on 07/12/2023. Pt presents with markedly decreased R knee AROM and strength, and decreased functional mobility. Pt will benefit from skilled PT 2w8 to address impairments to optimize R LE/knee function with less pain.   OBJECTIVE IMPAIRMENTS: decreased activity tolerance, decreased mobility, difficulty walking, decreased ROM, decreased strength, increased edema, obesity, and pain.   ACTIVITY LIMITATIONS: carrying, lifting, bending, sitting, standing, squatting, sleeping, stairs, transfers, bed mobility, bathing, toileting, dressing, locomotion level, and caring for others  PARTICIPATION LIMITATIONS: meal prep, cleaning, laundry, and driving  PERSONAL FACTORS: Age, Fitness, Past/current experiences, Time since onset of injury/illness/exacerbation, and 1 comorbidity: high BMI  are also affecting patient's functional outcome.   REHAB POTENTIAL: Good  CLINICAL DECISION  MAKING: Stable/uncomplicated  EVALUATION COMPLEXITY: Low   GOALS:  SHORT TERM GOALS: Target date: 08/06/23 Pt will be Ind in an initial HEP  Baseline: started Goal status: INITIAL  2.  Pt will be able to complete a SLR and SAQ with or without quad lag Baseline: notable to complete Goal status: INITIAL  3.  Increase R knee AROM to 15-80d for improved R knee function Baseline: 30-50 Goal status: INITIAL  LONG TERM  GOALS: Target date: 09/17/23  Pt will be Ind in a final HEP to maintain achieved LOF Baseline: started Goal status: INITIAL  2.  Increase R hip and knee strength to 4/5 for improved function use of the R LE Baseline: see flow sheets Goal status: INITIAL  3.  Increased R knee AROM to 5-115d for appropriate fuctional mobility Baseline: 30-50d Goal status: INITIAL  4.  Improve 5xSTS by MCID of 5" and by MCID of 51ft as indication of improved functional mobility  Baseline: TBA when pt has progressed to gait c a SPC Goal status: INITIAL  5.  Pt's FOTO score will improved to the predicted value of 43% as indication of improved function  Baseline: 22% Goal status: INITIAL  6.  Pt will be able to walk 451ft c or s a SPC and ascend/descend 12 steps c or s a SPC and c use of a HR for community mobility Baseline: 42ft c RW Goal status: INITIAL   PLAN:  PT FREQUENCY: 2x/week  PT DURATION: 8 weeks  PLANNED INTERVENTIONS: 97164- PT Re-evaluation, 97110-Therapeutic exercises, 97530- Therapeutic activity, 97535- Self Care, [id number]- Manual therapy, [id number]- Gait training, 97014- Electrical stimulation (unattended), [id number]- Electrical stimulation (manual), 97016- Vasopneumatic device, Stair training, Dry Needling, Joint mobilization, Cryotherapy, and Moist heat  PLAN FOR NEXT SESSION: Review FOTO; assess response to HEP; progress therex as indicated; use of modalities, manual therapy; and TPDN as indicated.   Marybel Alcott MS, PT 07/27/23 5:06 PM

## 2023-07-29 ENCOUNTER — Ambulatory Visit: Payer: Medicare HMO | Admitting: Physical Therapy

## 2023-07-29 ENCOUNTER — Ambulatory Visit: Payer: Medicare HMO

## 2023-07-29 DIAGNOSIS — R262 Difficulty in walking, not elsewhere classified: Secondary | ICD-10-CM | POA: Diagnosis not present

## 2023-07-29 DIAGNOSIS — M6281 Muscle weakness (generalized): Secondary | ICD-10-CM

## 2023-07-29 DIAGNOSIS — M25561 Pain in right knee: Secondary | ICD-10-CM | POA: Diagnosis not present

## 2023-07-29 DIAGNOSIS — G8929 Other chronic pain: Secondary | ICD-10-CM | POA: Diagnosis not present

## 2023-07-29 NOTE — Therapy (Signed)
OUTPATIENT PHYSICAL THERAPY LOWER EXTREMITY EVALUATION   Patient Name: Hannah [name] MRN: [id number] DOB:08-Jul-1946, 77 y.o., female Today's Date: 07/29/2023  END OF SESSION:  PT End of Session - 07/29/23 1529     Visit Number 5    Number of Visits 17    Date for PT Re-Evaluation 09/17/23    Authorization Type [employer]IC[employer]    PT Start Time 1503    PT Stop Time 1545    PT Time Calculation (min) 42 min    Equipment Utilized During Treatment Other (comment)   RW   Activity Tolerance Patient tolerated treatment well;Patient limited by pain    Behavior During Therapy WFL for tasks assessed/performed                 Past Medical History:  Diagnosis Date   Allergy 25 years ago.   Anemia    Arthritis    Asthma    Atrial fibrillation (HCC)    Cataract Had cataracts surgery 2015 & 2016   Chronic kidney disease    Diabetes mellitus without complication (HCC)    Gout 12/17/2014   patient reported   Heart murmur 4 years ago   Hyperlipidemia    Hypertension    Hypothyroidism    Systemic lupus erythematosus (HCC)    Vitiligo    Past Surgical History:  Procedure Laterality Date   CATARACT EXTRACTION Bilateral 2015   DILATION AND CURETTAGE OF UTERUS     DOPPLER ECHOCARDIOGRAPHY  05/2018   Internist to review with pt; potential heart murmur 06/20/18   EYE SURGERY  Cataract Surgery, 2015 & 2016.   keratosis removal  2021   KNEE CLOSED REDUCTION Left 07/06/2022   Procedure: CLOSED MANIPULATION KNEE;  Surgeon: Ollen Gross, MD;  Location: [facility];  Service: Orthopedics;  Laterality: Left;   REPLACEMENT TOTAL KNEE Left    SKIN SURGERY  11/30/2018   left side of face   TOOTH EXTRACTION     TOTAL KNEE ARTHROPLASTY Left 02/02/2022   Procedure: TOTAL KNEE ARTHROPLASTY;  Surgeon: Ollen Gross, MD;  Location: [facility];  Service: Orthopedics;  Laterality: Left;   TOTAL KNEE ARTHROPLASTY Right 07/12/2023   Procedure: RIGHT TOTAL KNEE ARTHROPLASTY;  Surgeon: Ollen Gross, MD;  Location: [facility];  Service: Orthopedics;  Laterality: Right;   Patient Active Problem List   Diagnosis Date Noted   Pure hypercholesterolemia 07/18/2023   Personal history of COVID-19 07/18/2023   Primary osteoarthritis of right knee 07/12/2023   Acquired thrombophilia (HCC) 01/12/2023   Arthrofibrosis of total knee arthroplasty (HCC) 07/06/2022   OA (osteoarthritis) of knee 02/02/2022   Primary osteoarthritis of left knee 02/02/2022   Chronic bilateral low back pain without sciatica 06/25/2021   Paresthesia and pain of extremity 06/25/2021   Atrial fibrillation (HCC) 04/16/2021   Demand ischemia (HCC) 03/26/2021   Unspecified atrial fibrillation (HCC) 03/26/2021   COVID-19 virus infection 03/26/2021   PVC (premature ventricular contraction) 05/06/2020   Type 2 diabetes mellitus with stage 3 chronic kidney disease, without long-term current use of insulin (HCC) 03/20/2020   Palpitations 03/20/2020   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   Primary hypothyroidism 03/20/2020   Vitiligo 03/20/2020   Class 2 severe obesity due to excess calories with serious comorbidity and body mass index (BMI) of 35.0 to 35.9 in adult (HCC) 10/31/2019   Iron deficiency anemia 08/12/2017   Osteoporosis 10/02/2016   Systemic lupus erythematosus (HCC) 10/02/2016   Rheumatoid arthritis involving multiple sites with positive rheumatoid  factor (HCC) 10/02/2016   High risk medication use 10/02/2016   History of chronic kidney disease 10/02/2016   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/2016   Primary osteoarthritis of both knees 10/02/2016   Vitamin D deficiency 10/02/2016   History of diabetes mellitus 10/02/2016   History of hypertension 10/02/2016   History of asthma 10/02/2016   Absolute anemia    MGUS (monoclonal gammopathy of unknown significance)    Normocytic anemia 03/21/2015   Essential hypertension 03/20/2015   Abnormal CT of the chest 07/30/2012    Asthma 06/19/2012    PCP: Dorothyann Peng, MD  REFERRING PROVIDER: Ollen Gross, MD   REFERRING DIAG: M17.11 (ICD-10-CM) - Unilateral primary osteoarthritis, right knee   THERAPY DIAG:  Chronic pain of right knee  Muscle weakness (generalized)  Difficulty in walking, not elsewhere classified  Rationale for Evaluation and Treatment: Rehabilitation  ONSET DATE: 07/12/23  SUBJECTIVE:   SUBJECTIVE STATEMENT: Pt reports she has been consistent with the completion of her HEP.  EVAL: Pt reports she has been completing her HEP 1x daily and using cold packs on a frequent basis for pain and swelling management.  PAIN:  Are you having pain? Yes: NPRS scale: 5/10; pain range 5-10/10 Pain location: R knee Pain description: ache ,throb ,sharp Aggravating factors: movement, lifting, wt bearing Relieving factors: Pain medication, muscle relaxer, cold packs  PERTINENT HISTORY: High BMI, L LKA, Afib  PRECAUTIONS: Knee  RED FLAGS: None   WEIGHT BEARING RESTRICTIONS: No  FALLS:  Has patient fallen in last 6 months? No  LIVING ENVIRONMENT: Lives with: lives alone Lives in: House/apartment Stairs: No Has following equipment at home: Single point cane, Environmental consultant - 2 wheeled, Wheelchair (manual), and bed side commode  OCCUPATION: Retired  PLOF: Independent with household mobility with device  PATIENT GOALS: good use of my R knee  NEXT MD VISIT: 07/27/23  OBJECTIVE:  Note: Objective measures were completed at Evaluation unless otherwise noted.  DIAGNOSTIC FINDINGS: None for the R knee in Epic  PATIENT SURVEYS:  FOTO: Perceived function   22%, predicted   43%   COGNITION: Overall cognitive status: Within functional limits for tasks assessed     SENSATION: WFL  EDEMA:  Significant R knee edema present   MUSCLE LENGTH: Hamstrings: Right NT deg; Left NT deg Thomas test: Right NT deg; Left NT deg  POSTURE: rounded shoulders, forward head, and anterior pelvic  tilt  PALPATION: TTP to the R peri-knee  LOWER EXTREMITY ROM:  Active ROM Right eval Left eval Rt 07/20/23 Rt 07/22/23 RT 07/27/23 Rt 07/29/23  Hip flexion        Hip extension        Hip abduction        Hip adduction        Hip internal rotation        Hip external rotation        Knee flexion 50  70 AA 80 AA75 AA85  Knee extension 30  30 30 23  25d  Ankle dorsiflexion        Ankle plantarflexion        Ankle inversion        Ankle eversion         (Blank rows = not tested)  LOWER EXTREMITY MMT:  MMT Right eval Left eval RT 07/27/23  Hip flexion 2  3 with limited ROM  Hip extension 2    Hip abduction 2    Hip adduction  Hip internal rotation     Hip external rotation 2    Knee flexion 2    Knee extension 2  3 with limited ROM  Ankle dorsiflexion     Ankle plantarflexion     Ankle inversion     Ankle eversion      (Blank rows = not tested)  LOWER EXTREMITY SPECIAL TESTS:  NT  FUNCTIONAL TESTS:  5 times sit to stand: TBA 2 minute walk test: TBA  GAIT: Distance walked: 54ft Assistive device utilized: Environmental consultant - 2 wheeled Level of assistance: SBA Comments: Antalgic over the R LE  TRANSFER: Mod A for mat table transfer c A for the R LE   TODAY'S TREATMENT:  [facility] Adult PT Treatment:                                                DATE: 10/28/22 Therapeutic Exercise: Nustep 7 min L# progrssive flexion as tolerated Seated heal slides, flexion/ext, x10 c strap Seated heal slides, flexion/ext, x5, 20" hold c PT assist Quad sets x10 5" Quad sets x5 20" hold c PT assist SAQ 2x10 SLR 2x10 Manual Therapy: Gentle LAD distraction combined with R knee ext  [facility] Adult PT Treatment:                                                DATE: 07/27/23 Therapeutic Exercise: Seated heal slides x20 Seated heal slides, flexion/ext, x5, 20 sec hold c pt assist Supine heal slides c strap assist, x15, 10 sec hold c PT assist Quad sets x10 10 sec hold c PT assist SAQ  2x10 SLR 2x10 Therapeutic Activity: Gait training: verbal cueing for heel to toe gait pattern Manual Therapy: Gentle LAD distraction combined with R knee ext  [facility] Adult PT Treatment:                                                DATE: 07/22/23 Therapeutic Exercise: Seated heal slides, flexion/ext, 2x10, 15 sec hold c PT assist Supine heal slides c strap assist, x15, 10 sec hold c PT assist Quad sets x10 10 sec hold c PT assist SAQ 2x10 SLR x10 hip abd x10 Manual Therapy: Gentle LAD distraction combined with R knee ext Therapeutic Activity: Gait training c RW x54ft x2. Antalgic gait pattern c step to gait  [facility] Adult PT Treatment:                                                DATE: 07/20/23 Therapeutic Exercise: Seated heal slides, flexion/ext, 2x10 Supine heal slides c strap assist, x15, 10 sec holds Quad sets x10 SAQ 2x10 AA hip abd c strap Manual Therapy: Gentle knee distraction combined with R leg ext Therapeutic Activity: Gait training c RW x65ft x2. Antalgic gait pattern. Modalities: Cold pack x10 mins to R knee Self Care: Pt Ed re: signs and symptoms of infection. Increased redness, warm, swelling and pain. Pt is to call if  her current condition worsens or if has a temperature greater than 101d.                                                                                                                              [facility] Adult PT Treatment:                                                DATE: 07/16/23 Therapeutic Exercise: Developed, instructed in, and pt completed therex as noted in HEP. Recommended pt completing her HEP x3 daily  Self Care: RICE periodically for 15 mins for symptom management   PATIENT EDUCATION:  Education details: Eval findings, POC, HEP, self care  Person educated: Patient Education method: Explanation, Demonstration, Tactile cues, Verbal cues, and Handouts Education comprehension: verbalized understanding, returned demonstration, verbal  cues required, and tactile cues required  HOME EXERCISE PROGRAM: Access Code: [email or web address] URL: [email or web address] Date: 07/20/2023 Prepared by: Joellyn Rued  Exercises - Long Sitting Quad Set (Mirrored)  - 3 x daily - 7 x weekly - 1 sets - 10 reps - 5 hold - Supine Heel Slide with Strap  - 3 x daily - 7 x weekly - 1 sets - 10 reps - 10 hold - Supine Knee Extension Strengthening (Mirrored)  - 3 x daily - 7 x weekly - 1 sets - 10 reps - 3 hold - Supine Hip Abduction AROM (Mirrored)  - 3 x daily - 7 x weekly - 1 sets - 10 reps - 3 hold - Seated Knee Flexion Slide  - 3 x daily - 7 x weekly - 1 sets - 10 reps - 10 hold  ASSESSMENT:  CLINICAL IMPRESSION: PT was completed for R knee ROM and strengthening with the focus on ROM. Flexion improved from the last PT appt, Ext is the same as the last appt, and has made min gains overall. Pt tolerated PT today without adverse effects. Pt is able to complete a SLR and SAQ with limited knee ext ROM present. Pt will continue to benefit from skilled PT to address impairments for improved R knee/LE function.    Eval: Patient is a 77 y.o. female who was seen today for physical therapy evaluation and treatment for M17.11 (ICD-10-CM) - Unilateral primary osteoarthritis, right knee . RIGHT TOTAL KNEE ARTHROPLASTY on 07/12/2023. Pt presents with markedly decreased R knee AROM and strength, and decreased functional mobility. Pt will benefit from skilled PT 2w8 to address impairments to optimize R LE/knee function with less pain.   OBJECTIVE IMPAIRMENTS: decreased activity tolerance, decreased mobility, difficulty walking, decreased ROM, decreased strength, increased edema, obesity, and pain.   ACTIVITY LIMITATIONS: carrying, lifting, bending, sitting, standing, squatting, sleeping, stairs, transfers, bed mobility, bathing, toileting, dressing, locomotion level, and caring for others  PARTICIPATION LIMITATIONS: meal prep, cleaning, laundry, and  driving  PERSONAL  FACTORS: Age, Fitness, Past/current experiences, Time since onset of injury/illness/exacerbation, and 1 comorbidity: high BMI  are also affecting patient's functional outcome.   REHAB POTENTIAL: Good  CLINICAL DECISION MAKING: Stable/uncomplicated  EVALUATION COMPLEXITY: Low   GOALS:  SHORT TERM GOALS: Target date: 08/06/23 Pt will be Ind in an initial HEP  Baseline: started Goal status: INITIAL  2.  Pt will be able to complete a SLR and SAQ with or without quad lag Baseline: not able to complete 07/29/23: able to complete both with limited knee ext ROM present Goal status: MET  3.  Increase R knee AROM to 15-80d for improved R knee function Baseline: 30-50 Goal status: INITIAL  LONG TERM GOALS: Target date: 09/17/23  Pt will be Ind in a final HEP to maintain achieved LOF Baseline: started Goal status: INITIAL  2.  Increase R hip and knee strength to 4/5 for improved function use of the R LE Baseline: see flow sheets Goal status: INITIAL  3.  Increased R knee AROM to 5-115d for appropriate fuctional mobility Baseline: 30-50d Goal status: INITIAL  4.  Improve 5xSTS by MCID of 5" and by MCID of 38ft as indication of improved functional mobility  Baseline: TBA when pt has progressed to gait c a SPC Goal status: INITIAL  5.  Pt's FOTO score will improved to the predicted value of 43% as indication of improved function  Baseline: 22% Goal status: INITIAL  6.  Pt will be able to walk 471ft c or s a SPC and ascend/descend 12 steps c or s a SPC and c use of a HR for community mobility Baseline: 61ft c RW Goal status: INITIAL   PLAN:  PT FREQUENCY: 2x/week  PT DURATION: 8 weeks  PLANNED INTERVENTIONS: 97164- PT Re-evaluation, 97110-Therapeutic exercises, 97530- Therapeutic activity, 97535- Self Care, 16109- Manual therapy, [id number]- Gait training, 97014- Electrical stimulation (unattended), [id number]- Electrical stimulation (manual), 97016-  Vasopneumatic device, Stair training, Dry Needling, Joint mobilization, Cryotherapy, and Moist heat  PLAN FOR NEXT SESSION: Review FOTO; assess response to HEP; progress therex as indicated; use of modalities, manual therapy; and TPDN as indicated.   Cassara Nida MS, PT 07/29/23 8:57 PM

## 2023-08-02 ENCOUNTER — Telehealth: Payer: Self-pay | Admitting: Pharmacist

## 2023-08-02 NOTE — Telephone Encounter (Signed)
Received fax from [facility] that authorization for PROLIA has been extended from 09/29/2023 through 09/27/2024  Ref # [id number] Phone: [phone]  Chesley Mires, PharmD, MPH, BCPS, CPP Clinical Pharmacist (Rheumatology and Pulmonology)

## 2023-08-03 ENCOUNTER — Encounter: Payer: Self-pay | Admitting: Physical Therapy

## 2023-08-03 ENCOUNTER — Ambulatory Visit: Payer: Medicare HMO | Attending: Internal Medicine | Admitting: Physical Therapy

## 2023-08-03 DIAGNOSIS — M25561 Pain in right knee: Secondary | ICD-10-CM | POA: Insufficient documentation

## 2023-08-03 DIAGNOSIS — G8929 Other chronic pain: Secondary | ICD-10-CM | POA: Diagnosis not present

## 2023-08-03 DIAGNOSIS — M6281 Muscle weakness (generalized): Secondary | ICD-10-CM | POA: Diagnosis not present

## 2023-08-03 DIAGNOSIS — R262 Difficulty in walking, not elsewhere classified: Secondary | ICD-10-CM | POA: Diagnosis not present

## 2023-08-03 NOTE — Therapy (Signed)
OUTPATIENT PHYSICAL THERAPY LOWER EXTREMITY TREATMENT   Patient Name: Hannah [name] MRN: [id number] DOB:Aug 05, 1946, 77 y.o., female Today's Date: 08/03/2023  END OF SESSION:  PT End of Session - 08/03/23 1404     Visit Number 6    Number of Visits 17    Date for PT Re-Evaluation 09/17/23    Authorization Type [employer]IC[employer]    PT Start Time 0200    PT Stop Time 0240    PT Time Calculation (min) 40 min                 Past Medical History:  Diagnosis Date   Allergy 25 years ago.   Anemia    Arthritis    Asthma    Atrial fibrillation (HCC)    Cataract Had cataracts surgery 2015 & 2016   Chronic kidney disease    Diabetes mellitus without complication (HCC)    Gout 12/17/2014   patient reported   Heart murmur 4 years ago   Hyperlipidemia    Hypertension    Hypothyroidism    Systemic lupus erythematosus (HCC)    Vitiligo    Past Surgical History:  Procedure Laterality Date   CATARACT EXTRACTION Bilateral 2015   DILATION AND CURETTAGE OF UTERUS     DOPPLER ECHOCARDIOGRAPHY  05/2018   Internist to review with pt; potential heart murmur 06/20/18   EYE SURGERY  Cataract Surgery, 2015 & 2016.   keratosis removal  2021   KNEE CLOSED REDUCTION Left 07/06/2022   Procedure: CLOSED MANIPULATION KNEE;  Surgeon: Ollen Gross, MD;  Location: [facility];  Service: Orthopedics;  Laterality: Left;   REPLACEMENT TOTAL KNEE Left    SKIN SURGERY  11/30/2018   left side of face   TOOTH EXTRACTION     TOTAL KNEE ARTHROPLASTY Left 02/02/2022   Procedure: TOTAL KNEE ARTHROPLASTY;  Surgeon: Ollen Gross, MD;  Location: [facility];  Service: Orthopedics;  Laterality: Left;   TOTAL KNEE ARTHROPLASTY Right 07/12/2023   Procedure: RIGHT TOTAL KNEE ARTHROPLASTY;  Surgeon: Ollen Gross, MD;  Location: [facility];  Service: Orthopedics;  Laterality: Right;   Patient Active Problem List   Diagnosis Date Noted   Pure hypercholesterolemia 07/18/2023   Personal history of COVID-19  07/18/2023   Primary osteoarthritis of right knee 07/12/2023   Acquired thrombophilia (HCC) 01/12/2023   Arthrofibrosis of total knee arthroplasty (HCC) 07/06/2022   OA (osteoarthritis) of knee 02/02/2022   Primary osteoarthritis of left knee 02/02/2022   Chronic bilateral low back pain without sciatica 06/25/2021   Paresthesia and pain of extremity 06/25/2021   Atrial fibrillation (HCC) 04/16/2021   Demand ischemia (HCC) 03/26/2021   Unspecified atrial fibrillation (HCC) 03/26/2021   COVID-19 virus infection 03/26/2021   PVC (premature ventricular contraction) 05/06/2020   Type 2 diabetes mellitus with stage 3 chronic kidney disease, without long-term current use of insulin (HCC) 03/20/2020   Palpitations 03/20/2020   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   Primary hypothyroidism 03/20/2020   Vitiligo 03/20/2020   Class 2 severe obesity due to excess calories with serious comorbidity and body mass index (BMI) of 35.0 to 35.9 in adult (HCC) 10/31/2019   Iron deficiency anemia 08/12/2017   Osteoporosis 10/02/2016   Systemic lupus erythematosus (HCC) 10/02/2016   Rheumatoid arthritis involving multiple sites with positive rheumatoid factor (HCC) 10/02/2016   High risk medication use 10/02/2016   History of chronic kidney disease 10/02/2016   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/2016   Primary osteoarthritis  of both knees 10/02/2016   Vitamin D deficiency 10/02/2016   History of diabetes mellitus 10/02/2016   History of hypertension 10/02/2016   History of asthma 10/02/2016   Absolute anemia    MGUS (monoclonal gammopathy of unknown significance)    Normocytic anemia 03/21/2015   Essential hypertension 03/20/2015   Abnormal CT of the chest 07/30/2012   Asthma 06/19/2012    PCP: Dorothyann Peng, MD  REFERRING PROVIDER: Ollen Gross, MD   REFERRING DIAG: M17.11 (ICD-10-CM) - Unilateral primary osteoarthritis, right knee   THERAPY DIAG:   Chronic pain of right knee  Muscle weakness (generalized)  Difficulty in walking, not elsewhere classified  Rationale for Evaluation and Treatment: Rehabilitation  ONSET DATE: 07/12/23  SUBJECTIVE:   SUBJECTIVE STATEMENT: Pt reports she has had increased shooting pain over last 2 days located medial right knee. Has tried to complete HEP.   EVAL: Pt reports she has been completing her HEP 1x daily and using cold packs on a frequent basis for pain and swelling management.  PAIN:  Are you having pain? Yes: NPRS scale: 8/10; pain range 5-10/10 Pain location: R knee Pain description: ache ,throb ,sharp Aggravating factors: movement, lifting, wt bearing Relieving factors: Pain medication, muscle relaxer, cold packs  PERTINENT HISTORY: High BMI, L LKA, Afib  PRECAUTIONS: Knee  RED FLAGS: None   WEIGHT BEARING RESTRICTIONS: No  FALLS:  Has patient fallen in last 6 months? No  LIVING ENVIRONMENT: Lives with: lives alone Lives in: House/apartment Stairs: No Has following equipment at home: Single point cane, Environmental consultant - 2 wheeled, Wheelchair (manual), and bed side commode  OCCUPATION: Retired  PLOF: Independent with household mobility with device  PATIENT GOALS: good use of my R knee  NEXT MD VISIT: 07/27/23  OBJECTIVE:  Note: Objective measures were completed at Evaluation unless otherwise noted.  DIAGNOSTIC FINDINGS: None for the R knee in Epic  PATIENT SURVEYS:  FOTO: Perceived function   22%, predicted   43%   COGNITION: Overall cognitive status: Within functional limits for tasks assessed     SENSATION: WFL  EDEMA:  Significant R knee edema present   MUSCLE LENGTH: Hamstrings: Right NT deg; Left NT deg Thomas test: Right NT deg; Left NT deg  POSTURE: rounded shoulders, forward head, and anterior pelvic tilt  PALPATION: TTP to the R peri-knee  LOWER EXTREMITY ROM:  Active ROM Right eval Left eval Rt 07/20/23 Rt 07/22/23 RT 07/27/23  Rt 07/29/23 08/03/23  Hip flexion         Hip extension         Hip abduction         Hip adduction         Hip internal rotation         Hip external rotation         Knee flexion 50  70 AA 80 AA75 AA85 AA 72  Knee extension 30  30 30 23  25d   Ankle dorsiflexion         Ankle plantarflexion         Ankle inversion         Ankle eversion          (Blank rows = not tested)  LOWER EXTREMITY MMT:  MMT Right eval Left eval RT 07/27/23  Hip flexion 2  3 with limited ROM  Hip extension 2    Hip abduction 2    Hip adduction     Hip internal rotation     Hip external  rotation 2    Knee flexion 2    Knee extension 2  3 with limited ROM  Ankle dorsiflexion     Ankle plantarflexion     Ankle inversion     Ankle eversion      (Blank rows = not tested)  LOWER EXTREMITY SPECIAL TESTS:  NT  FUNCTIONAL TESTS:  5 times sit to stand: TBA 2 minute walk test: TBA  GAIT: Distance walked: 46ft Assistive device utilized: Environmental consultant - 2 wheeled Level of assistance: SBA Comments: Antalgic over the R LE  TRANSFER: Mod A for mat table transfer c A for the R LE   TODAY'S TREATMENT:  [facility] Adult PT Treatment:                                                DATE: 08/03/23 Therapeutic Exercise: Nustep L3 x 5 minutes  Seated heel slides with cloth on floor Seated DF /knee ext with strap Seated heel and toe raises Supine attempted heel slides- increased pain Seated march Seated red band clam  Manual Therapy: A/P seated tibia mobs Supine PROM knee flexion and ext   Therapeutic Activity: Gait training: verbal cueing for step through pattern Modalities: Brief ice pack to medial knee at end of session    [facility] Adult PT Treatment:                                                DATE: 07/29/23 Therapeutic Exercise: Nustep 7 min L# progrssive flexion as tolerated Seated heal slides, flexion/ext, x10 c strap Seated heal slides, flexion/ext, x5, 20" hold c PT assist Quad sets x10 5" Quad  sets x5 20" hold c PT assist SAQ 2x10 SLR 2x10 Manual Therapy: Gentle LAD distraction combined with R knee ext  [facility] Adult PT Treatment:                                                DATE: 07/27/23 Therapeutic Exercise: Seated heal slides x20 Seated heal slides, flexion/ext, x5, 20 sec hold c pt assist Supine heal slides c strap assist, x15, 10 sec hold c PT assist Quad sets x10 10 sec hold c PT assist SAQ 2x10 SLR 2x10 Therapeutic Activity: Gait training: verbal cueing for heel to toe gait pattern Manual Therapy: Gentle LAD distraction combined with R knee ext  [facility] Adult PT Treatment:                                                DATE: 07/22/23 Therapeutic Exercise: Seated heal slides, flexion/ext, 2x10, 15 sec hold c PT assist Supine heal slides c strap assist, x15, 10 sec hold c PT assist Quad sets x10 10 sec hold c PT assist SAQ 2x10 SLR x10 hip abd x10 Manual Therapy: Gentle LAD distraction combined with R knee ext Therapeutic Activity: Gait training c RW x79ft x2. Antalgic gait pattern c step to gait  [facility] Adult PT Treatment:  DATE: 07/20/23 Therapeutic Exercise: Seated heal slides, flexion/ext, 2x10 Supine heal slides c strap assist, x15, 10 sec holds Quad sets x10 SAQ 2x10 AA hip abd c strap Manual Therapy: Gentle knee distraction combined with R leg ext Therapeutic Activity: Gait training c RW x18ft x2. Antalgic gait pattern. Modalities: Cold pack x10 mins to R knee Self Care: Pt Ed re: signs and symptoms of infection. Increased redness, warm, swelling and pain. Pt is to call if her current condition worsens or if has a temperature greater than 101d.                                                                                                                              [facility] Adult PT Treatment:                                                DATE: 07/16/23 Therapeutic Exercise: Developed, instructed in, and  pt completed therex as noted in HEP. Recommended pt completing her HEP x3 daily  Self Care: RICE periodically for 15 mins for symptom management   PATIENT EDUCATION:  Education details: Eval findings, POC, HEP, self care  Person educated: Patient Education method: Explanation, Demonstration, Tactile cues, Verbal cues, and Handouts Education comprehension: verbalized understanding, returned demonstration, verbal cues required, and tactile cues required  HOME EXERCISE PROGRAM: Access Code: [email or web address] URL: [email or web address] Date: 07/20/2023 Prepared by: Joellyn Rued  Exercises - Long Sitting Quad Set (Mirrored)  - 3 x daily - 7 x weekly - 1 sets - 10 reps - 5 hold - Supine Heel Slide with Strap  - 3 x daily - 7 x weekly - 1 sets - 10 reps - 10 hold - Supine Knee Extension Strengthening (Mirrored)  - 3 x daily - 7 x weekly - 1 sets - 10 reps - 3 hold - Supine Hip Abduction AROM (Mirrored)  - 3 x daily - 7 x weekly - 1 sets - 10 reps - 3 hold - Seated Knee Flexion Slide  - 3 x daily - 7 x weekly - 1 sets - 10 reps - 10 hold  ASSESSMENT:  CLINICAL IMPRESSION: PT was completed for R knee ROM and strengthening with the focus on ROM. Pt with increased pain today and poor tolerance to therex.  Pain located medial knee. Knee flexion decreased compared to last visit. Pt will continue to benefit from skilled PT to address impairments for improved R knee/LE function.    Eval: Patient is a 77 y.o. female who was seen today for physical therapy evaluation and treatment for M17.11 (ICD-10-CM) - Unilateral primary osteoarthritis, right knee . RIGHT TOTAL KNEE ARTHROPLASTY on 07/12/2023. Pt presents with markedly decreased R knee AROM and strength, and decreased functional mobility. Pt will benefit from skilled PT 2w8 to address impairments to optimize R LE/knee function with  less pain.   OBJECTIVE IMPAIRMENTS: decreased activity tolerance, decreased mobility, difficulty walking, decreased  ROM, decreased strength, increased edema, obesity, and pain.   ACTIVITY LIMITATIONS: carrying, lifting, bending, sitting, standing, squatting, sleeping, stairs, transfers, bed mobility, bathing, toileting, dressing, locomotion level, and caring for others  PARTICIPATION LIMITATIONS: meal prep, cleaning, laundry, and driving  PERSONAL FACTORS: Age, Fitness, Past/current experiences, Time since onset of injury/illness/exacerbation, and 1 comorbidity: high BMI  are also affecting patient's functional outcome.   REHAB POTENTIAL: Good  CLINICAL DECISION MAKING: Stable/uncomplicated  EVALUATION COMPLEXITY: Low   GOALS:  SHORT TERM GOALS: Target date: 08/06/23 Pt will be Ind in an initial HEP  Baseline: started Goal status: INITIAL  2.  Pt will be able to complete a SLR and SAQ with or without quad lag Baseline: not able to complete 07/29/23: able to complete both with limited knee ext ROM present Goal status: MET  3.  Increase R knee AROM to 15-80d for improved R knee function Baseline: 30-50 Goal status: INITIAL  LONG TERM GOALS: Target date: 09/17/23  Pt will be Ind in a final HEP to maintain achieved LOF Baseline: started Goal status: INITIAL  2.  Increase R hip and knee strength to 4/5 for improved function use of the R LE Baseline: see flow sheets Goal status: INITIAL  3.  Increased R knee AROM to 5-115d for appropriate fuctional mobility Baseline: 30-50d Goal status: INITIAL  4.  Improve 5xSTS by MCID of 5" and by MCID of 44ft as indication of improved functional mobility  Baseline: TBA when pt has progressed to gait c a SPC Goal status: INITIAL  5.  Pt's FOTO score will improved to the predicted value of 43% as indication of improved function  Baseline: 22% Goal status: INITIAL  6.  Pt will be able to walk 460ft c or s a SPC and ascend/descend 12 steps c or s a SPC and c use of a HR for community mobility Baseline: 25ft c RW Goal status:  INITIAL   PLAN:  PT FREQUENCY: 2x/week  PT DURATION: 8 weeks  PLANNED INTERVENTIONS: 97164- PT Re-evaluation, 97110-Therapeutic exercises, 97530- Therapeutic activity, 97535- Self Care, 56213- Manual therapy, [id number]- Gait training, 97014- Electrical stimulation (unattended), [id number]- Electrical stimulation (manual), 97016- Vasopneumatic device, Stair training, Dry Needling, Joint mobilization, Cryotherapy, and Moist heat  PLAN FOR NEXT SESSION: Review FOTO; assess response to HEP; progress therex as indicated; use of modalities, manual therapy; and TPDN as indicated.   Jannette Spanner, PTA 08/03/23 3:45 PM Phone: [phone] Fax: [phone]

## 2023-08-06 ENCOUNTER — Ambulatory Visit: Payer: Medicare HMO

## 2023-08-06 DIAGNOSIS — M6281 Muscle weakness (generalized): Secondary | ICD-10-CM | POA: Diagnosis not present

## 2023-08-06 DIAGNOSIS — R262 Difficulty in walking, not elsewhere classified: Secondary | ICD-10-CM

## 2023-08-06 DIAGNOSIS — G8929 Other chronic pain: Secondary | ICD-10-CM | POA: Diagnosis not present

## 2023-08-06 DIAGNOSIS — M25561 Pain in right knee: Secondary | ICD-10-CM | POA: Diagnosis not present

## 2023-08-06 NOTE — Therapy (Signed)
OUTPATIENT PHYSICAL THERAPY LOWER EXTREMITY TREATMENT   Patient Name: Hannah [name] MRN: [id number] DOB:11/26/1945, 77 y.o., female Today's Date: 08/06/2023  END OF SESSION:  PT End of Session - 08/06/23 1410     Visit Number 7    Number of Visits 17    Date for PT Re-Evaluation 09/17/23    Authorization Type [employer] MEDICARE HMO    PT Start Time 1400    PT Stop Time 1445    PT Time Calculation (min) 45 min    Equipment Utilized During Treatment Other (comment)   RW   Activity Tolerance Patient tolerated treatment well;Patient limited by pain    Behavior During Therapy WFL for tasks assessed/performed                  Past Medical History:  Diagnosis Date   Allergy 25 years ago.   Anemia    Arthritis    Asthma    Atrial fibrillation (HCC)    Cataract Had cataracts surgery 2015 & 2016   Chronic kidney disease    Diabetes mellitus without complication (HCC)    Gout 12/17/2014   patient reported   Heart murmur 4 years ago   Hyperlipidemia    Hypertension    Hypothyroidism    Systemic lupus erythematosus (HCC)    Vitiligo    Past Surgical History:  Procedure Laterality Date   CATARACT EXTRACTION Bilateral 2015   DILATION AND CURETTAGE OF UTERUS     DOPPLER ECHOCARDIOGRAPHY  05/2018   Internist to review with pt; potential heart murmur 06/20/18   EYE SURGERY  Cataract Surgery, 2015 & 2016.   keratosis removal  2021   KNEE CLOSED REDUCTION Left 07/06/2022   Procedure: CLOSED MANIPULATION KNEE;  Surgeon: Ollen Gross, MD;  Location: [facility];  Service: Orthopedics;  Laterality: Left;   REPLACEMENT TOTAL KNEE Left    SKIN SURGERY  11/30/2018   left side of face   TOOTH EXTRACTION     TOTAL KNEE ARTHROPLASTY Left 02/02/2022   Procedure: TOTAL KNEE ARTHROPLASTY;  Surgeon: Ollen Gross, MD;  Location: [facility];  Service: Orthopedics;  Laterality: Left;   TOTAL KNEE ARTHROPLASTY Right 07/12/2023   Procedure: RIGHT TOTAL KNEE ARTHROPLASTY;  Surgeon: Ollen Gross, MD;  Location: [facility];  Service: Orthopedics;  Laterality: Right;   Patient Active Problem List   Diagnosis Date Noted   Pure hypercholesterolemia 07/18/2023   Personal history of COVID-19 07/18/2023   Primary osteoarthritis of right knee 07/12/2023   Acquired thrombophilia (HCC) 01/12/2023   Arthrofibrosis of total knee arthroplasty (HCC) 07/06/2022   OA (osteoarthritis) of knee 02/02/2022   Primary osteoarthritis of left knee 02/02/2022   Chronic bilateral low back pain without sciatica 06/25/2021   Paresthesia and pain of extremity 06/25/2021   Atrial fibrillation (HCC) 04/16/2021   Demand ischemia (HCC) 03/26/2021   Unspecified atrial fibrillation (HCC) 03/26/2021   COVID-19 virus infection 03/26/2021   PVC (premature ventricular contraction) 05/06/2020   Type 2 diabetes mellitus with stage 3 chronic kidney disease, without long-term current use of insulin (HCC) 03/20/2020   Palpitations 03/20/2020   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   Primary hypothyroidism 03/20/2020   Vitiligo 03/20/2020   Class 2 severe obesity due to excess calories with serious comorbidity and body mass index (BMI) of 35.0 to 35.9 in adult (HCC) 10/31/2019   Iron deficiency anemia 08/12/2017   Osteoporosis 10/02/2016   Systemic lupus erythematosus (HCC) 10/02/2016   Rheumatoid arthritis involving multiple sites with positive  rheumatoid factor (HCC) 10/02/2016   High risk medication use 10/02/2016   History of chronic kidney disease 10/02/2016   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/2016   Primary osteoarthritis of both knees 10/02/2016   Vitamin D deficiency 10/02/2016   History of diabetes mellitus 10/02/2016   History of hypertension 10/02/2016   History of asthma 10/02/2016   Absolute anemia    MGUS (monoclonal gammopathy of unknown significance)    Normocytic anemia 03/21/2015   Essential hypertension 03/20/2015   Abnormal CT of the chest 07/30/2012    Asthma 06/19/2012    PCP: Dorothyann Peng, MD  REFERRING PROVIDER: Ollen Gross, MD   REFERRING DIAG: M17.11 (ICD-10-CM) - Unilateral primary osteoarthritis, right knee   THERAPY DIAG:  Chronic pain of right knee  Muscle weakness (generalized)  Difficulty in walking, not elsewhere classified  Rationale for Evaluation and Treatment: Rehabilitation  ONSET DATE: 07/12/23  SUBJECTIVE:   SUBJECTIVE STATEMENT: Pt reports she has been using cold packs approx every 2 hours which has helped to decrease her R knee pain.  EVAL: Pt reports she has been completing her HEP 1x daily and using cold packs on a frequent basis for pain and swelling management.  PAIN:  Are you having pain? Yes: NPRS scale: 8/10; pain range 5-10/10 Pain location: R knee Pain description: ache ,throb ,sharp Aggravating factors: movement, lifting, wt bearing Relieving factors: Pain medication, muscle relaxer, cold packs  PERTINENT HISTORY: High BMI, L LKA, Afib  PRECAUTIONS: Knee  RED FLAGS: None   WEIGHT BEARING RESTRICTIONS: No  FALLS:  Has patient fallen in last 6 months? No  LIVING ENVIRONMENT: Lives with: lives alone Lives in: House/apartment Stairs: No Has following equipment at home: Single point cane, Environmental consultant - 2 wheeled, Wheelchair (manual), and bed side commode  OCCUPATION: Retired  PLOF: Independent with household mobility with device  PATIENT GOALS: good use of my R knee  NEXT MD VISIT: 07/27/23  OBJECTIVE:  Note: Objective measures were completed at Evaluation unless otherwise noted.  DIAGNOSTIC FINDINGS: None for the R knee in Epic  PATIENT SURVEYS:  FOTO: Perceived function   22%, predicted   43%   COGNITION: Overall cognitive status: Within functional limits for tasks assessed     SENSATION: WFL  EDEMA:  Significant R knee edema present   MUSCLE LENGTH: Hamstrings: Right NT deg; Left NT deg Thomas test: Right NT deg; Left NT deg  POSTURE: rounded  shoulders, forward head, and anterior pelvic tilt  PALPATION: TTP to the R peri-knee  LOWER EXTREMITY ROM:  Active ROM Right eval Left eval Rt 07/20/23 Rt 07/22/23 RT 07/27/23 Rt 07/29/23 08/03/23 Rt 08/06/23  Hip flexion          Hip extension          Hip abduction          Hip adduction          Hip internal rotation          Hip external rotation          Knee flexion 50  70 AA 80 AA75 AA85 AA 72 AA85  Knee extension 30  30 30 23  25d  30  Ankle dorsiflexion          Ankle plantarflexion          Ankle inversion          Ankle eversion           (Blank rows = not tested)  LOWER  EXTREMITY MMT:  MMT Right eval Left eval RT 07/27/23  Hip flexion 2  3 with limited ROM  Hip extension 2    Hip abduction 2    Hip adduction     Hip internal rotation     Hip external rotation 2    Knee flexion 2    Knee extension 2  3 with limited ROM  Ankle dorsiflexion     Ankle plantarflexion     Ankle inversion     Ankle eversion      (Blank rows = not tested)  LOWER EXTREMITY SPECIAL TESTS:  NT  FUNCTIONAL TESTS:  5 times sit to stand: TBA 2 minute walk test: TBA  GAIT: Distance walked: 87ft Assistive device utilized: Environmental consultant - 2 wheeled Level of assistance: SBA Comments: Antalgic over the R LE  TRANSFER: Mod A for mat table transfer c A for the R LE  TODAY'S TREATMENT:  [facility] Adult PT Treatment:                                                DATE: 08/06/23 Therapeutic Exercise: Nustep L3 x 7 minutes UE/LE Supine heel slide on disc x10; x5 c belt assist 30" QS c slide disc x10 5" Seated LAQ x10 Seated heel and toe raises Seated march x10 Seated red band clam x10 Manual Therapy: A/P seated tibia mobs Supine PROM knee flexion and ext  Gentle LAD distraction combined with R knee ext  [facility] Adult PT Treatment:                                                DATE: 08/03/23 Therapeutic Exercise: Nustep L3 x 5 minutes  Seated heel slides with cloth on floor Seated DF  /knee ext with strap Seated heel and toe raises Supine attempted heel slides- increased pain Seated march Seated red band clam  Manual Therapy: A/P seated tibia mobs Supine PROM knee flexion and ext  Therapeutic Activity: Gait training: verbal cueing for step through pattern Modalities: Brief ice pack to medial knee at end of session    [facility] Adult PT Treatment:                                                DATE: 07/29/23 Therapeutic Exercise: Nustep 7 min L# progrssive flexion as tolerated Seated heal slides, flexion/ext, x10 c strap Seated heal slides, flexion/ext, x5, 20" hold c PT assist Quad sets x10 5" Quad sets x5 20" hold c PT assist SAQ 2x10 SLR 2x10 Manual Therapy: Gentle LAD distraction combined with R knee ext  [facility] Adult PT Treatment:                                                DATE: 07/27/23 Therapeutic Exercise: Seated heal slides x20 Seated heal slides, flexion/ext, x5, 20 sec hold c pt assist Supine heal slides c strap assist, x15, 10 sec hold c PT assist Quad sets x10 10 sec  hold c PT assist SAQ 2x10 SLR 2x10 Therapeutic Activity: Gait training: verbal cueing for heel to toe gait pattern Manual Therapy: Gentle LAD distraction combined with R knee ext  [facility] Adult PT Treatment:                                                DATE: 07/22/23 Therapeutic Exercise: Seated heal slides, flexion/ext, 2x10, 15 sec hold c PT assist Supine heal slides c strap assist, x15, 10 sec hold c PT assist Quad sets x10 10 sec hold c PT assist SAQ 2x10 SLR x10 hip abd x10 Manual Therapy: Gentle LAD distraction combined with R knee ext Therapeutic Activity: Gait training c RW x91ft x2. Antalgic gait pattern c step to gait  [facility] Adult PT Treatment:                                                DATE: 07/20/23 Therapeutic Exercise: Seated heal slides, flexion/ext, 2x10 Supine heal slides c strap assist, x15, 10 sec holds Quad sets x10 SAQ 2x10 AA hip abd c  strap Manual Therapy: Gentle knee distraction combined with R leg ext Therapeutic Activity: Gait training c RW x25ft x2. Antalgic gait pattern. Modalities: Cold pack x10 mins to R knee Self Care: Pt Ed re: signs and symptoms of infection. Increased redness, warm, swelling and pain. Pt is to call if her current condition worsens or if has a temperature greater than 101d.                                                                                                                              [facility] Adult PT Treatment:                                                DATE: 07/16/23 Therapeutic Exercise: Developed, instructed in, and pt completed therex as noted in HEP. Recommended pt completing her HEP x3 daily  Self Care: RICE periodically for 15 mins for symptom management   PATIENT EDUCATION:  Education details: Eval findings, POC, HEP, self care  Person educated: Patient Education method: Explanation, Demonstration, Tactile cues, Verbal cues, and Handouts Education comprehension: verbalized understanding, returned demonstration, verbal cues required, and tactile cues required  HOME EXERCISE PROGRAM: Access Code: [email or web address] URL: [email or web address] Date: 07/20/2023 Prepared by: Joellyn Rued  Exercises - Long Sitting Quad Set (Mirrored)  - 3 x daily - 7 x weekly - 1 sets - 10 reps - 5 hold - Supine Heel Slide with Strap  - 3 x daily - 7 x weekly -  1 sets - 10 reps - 10 hold - Supine Knee Extension Strengthening (Mirrored)  - 3 x daily - 7 x weekly - 1 sets - 10 reps - 3 hold - Supine Hip Abduction AROM (Mirrored)  - 3 x daily - 7 x weekly - 1 sets - 10 reps - 3 hold - Seated Knee Flexion Slide  - 3 x daily - 7 x weekly - 1 sets - 10 reps - 10 hold  ASSESSMENT:  CLINICAL IMPRESSION: Pt participated in post R TKR rehab for ROM and strengthening. Pt reports she has been dedicated with using cold packs at home and returns to PT today with less knee pain and her knee  flexion has increased back to 85d. Overall, pt's strength and functional use of her R knee has improved, while pt's ext ROM is not improved being limited by a hard end feel and pain. Pt is going home directly after PT today and states she will use a cold pack then.  Eval: Patient is a 77 y.o. female who was seen today for physical therapy evaluation and treatment for M17.11 (ICD-10-CM) - Unilateral primary osteoarthritis, right knee . RIGHT TOTAL KNEE ARTHROPLASTY on 07/12/2023. Pt presents with markedly decreased R knee AROM and strength, and decreased functional mobility. Pt will benefit from skilled PT 2w8 to address impairments to optimize R LE/knee function with less pain.   OBJECTIVE IMPAIRMENTS: decreased activity tolerance, decreased mobility, difficulty walking, decreased ROM, decreased strength, increased edema, obesity, and pain.   ACTIVITY LIMITATIONS: carrying, lifting, bending, sitting, standing, squatting, sleeping, stairs, transfers, bed mobility, bathing, toileting, dressing, locomotion level, and caring for others  PARTICIPATION LIMITATIONS: meal prep, cleaning, laundry, and driving  PERSONAL FACTORS: Age, Fitness, Past/current experiences, Time since onset of injury/illness/exacerbation, and 1 comorbidity: high BMI  are also affecting patient's functional outcome.   REHAB POTENTIAL: Good  CLINICAL DECISION MAKING: Stable/uncomplicated  EVALUATION COMPLEXITY: Low   GOALS:  SHORT TERM GOALS: Target date: 08/06/23 Pt will be Ind in an initial HEP  Baseline: started Goal status: MET  2.  Pt will be able to complete a SLR and SAQ with or without quad lag Baseline: not able to complete 07/29/23: able to complete both with limited knee ext ROM present Goal status: MET  3.  Increase R knee AROM to 15-80d for improved R knee function Baseline: 30-50 Goal status: Not met, ROM for flexion reached  LONG TERM GOALS: Target date: 09/17/23  Pt will be Ind in a final HEP to  maintain achieved LOF Baseline: started Goal status: INITIAL  2.  Increase R hip and knee strength to 4/5 for improved function use of the R LE Baseline: see flow sheets Goal status: INITIAL  3.  Increased R knee AROM to 5-115d for appropriate fuctional mobility Baseline: 30-50d Goal status: INITIAL  4.  Improve 5xSTS by MCID of 5" and by MCID of 72ft as indication of improved functional mobility  Baseline: TBA when pt has progressed to gait c a SPC Goal status: INITIAL  5.  Pt's FOTO score will improved to the predicted value of 43% as indication of improved function  Baseline: 22% Goal status: INITIAL  6.  Pt will be able to walk 448ft c or s a SPC and ascend/descend 12 steps c or s a SPC and c use of a HR for community mobility Baseline: 52ft c RW Goal status: INITIAL   PLAN:  PT FREQUENCY: 2x/week  PT DURATION: 8 weeks  PLANNED INTERVENTIONS: 95638-  PT Re-evaluation, 97110-Therapeutic exercises, 97530- Therapeutic activity, 97535- Self Care, 40981- Manual therapy, [id number]- Gait training, 97014- Electrical stimulation (unattended), [id number]- Electrical stimulation (manual), 97016- Vasopneumatic device, Stair training, Dry Needling, Joint mobilization, Cryotherapy, and Moist heat  PLAN FOR NEXT SESSION: Review FOTO; assess response to HEP; progress therex as indicated; use of modalities, manual therapy; and TPDN as indicated.   Breland Elders MS, PT 08/06/23 8:24 PM

## 2023-08-09 ENCOUNTER — Other Ambulatory Visit: Payer: Self-pay | Admitting: Internal Medicine

## 2023-08-09 ENCOUNTER — Ambulatory Visit: Payer: Self-pay

## 2023-08-09 DIAGNOSIS — E039 Hypothyroidism, unspecified: Secondary | ICD-10-CM

## 2023-08-09 NOTE — Patient Outreach (Signed)
Care Coordination   Follow Up Visit Note   08/09/2023 Name: Hannah [name] MRN: [id number] DOB: [birth year]  Hannah [name] is a 77 y.o. year old female who sees Dorothyann Peng, MD for primary care. I spoke with  Hannah [name] by phone today.  What matters to the patients health and wellness today?  Patient would like assistance with medication cost. She would like to make a full recover from knee surgery, achieving full ROM to her right knee.     Goals Addressed             This Visit's Progress    To obtain assistance with cost of meds       Care Coordination Interventions: Patient interviewed about adult health maintenance status including  barriers related to refilling and affording medications  Determined patient has spoken with Catie Clearance Coots RPH-CPP for assistance with re-enrollment for assistance with Jardiance and Synthroid  Determined patient has also contacted [employer] to request assistance Discussed with patient she will also have hardship paying for other medications Sent new referral to Catie [employer] Coots RPH-CPP requesting outreach to patient for assistance, requested from Catie the re-enrollment packet for 2025 assistance for Jardiance and Synthroid be mailed to patient         To undergo right total knee surgery   On track    Care Coordination Interventions: Evaluation of current treatment plan related to arthritis of right knee  and patient's adherence to plan as established by provider Determined patient is recovering well from her right knee surgery, she continues to work with outpatient PT, slowly progressing toward wellness Counseled patient on the importance of adhering to her HEP as directed Pain assessment performed Medications reviewed Reviewed provider established plan for pain management Counseled on the importance of reporting any/all new or changed pain symptoms or management strategies to pain management provider Advised patient to report to  care team affect of pain on daily activities Discussed use of relaxation techniques and/or diversional activities to assist with pain reduction (distraction, imagery, relaxation, massage, acupressure, TENS, heat, and cold application Reviewed with patient prescribed pharmacological and nonpharmacological pain relief strategies Reviewed and discussed with patient her next scheduled follow up with Dr. Lequita Halt scheduled for 08/17/23 @5 :30 PM Discussed plans with patient for ongoing care coordination follow up and provided patient with direct contact information for nurse care coordinator       Interventions Today    Flowsheet Row Most Recent Value  Chronic Disease   Chronic disease during today's visit Other  [Thyroid disease,  s/p right knee replacement]  General Interventions   General Interventions Discussed/Reviewed General Interventions Discussed, General Interventions Reviewed, Labs, Doctor Visits, Durable Medical Equipment (DME), Communication with  Doctor Visits Discussed/Reviewed PCP, Doctor Visits Reviewed, Doctor Visits Discussed, Specialist  Durable Medical Equipment (DME) Dan Humphreys  Communication with PCP/Specialists, Pharmacists  [Dr. Allyne Gee,  Catie Harper RPH-CPP]  Exercise Interventions   Exercise Discussed/Reviewed Exercise Discussed, Exercise Reviewed, Physical Activity  Physical Activity Discussed/Reviewed Physical Activity Discussed, Physical Activity Reviewed, Home Exercise Program (HEP)  Education Interventions   Education Provided Provided Education  Provided Verbal Education On Labs, Medication, Exercise, When to see the doctor  Labs Reviewed --  [Thyroid]  Pharmacy Interventions   Pharmacy Dicussed/Reviewed Pharmacy Topics Reviewed, Pharmacy Topics Discussed, Medications and their functions, Affording Medications, Referral to Pharmacist  Referral to Pharmacist Cannot afford medications  Safety Interventions   Safety Discussed/Reviewed Home Safety, Fall Risk, Safety  Reviewed, Safety Discussed  Home Safety Assistive Devices  SDOH assessments and interventions completed:  No     Care Coordination Interventions:  Yes, provided   Follow up plan: Referral made to Catie [employer] Coots RPH-CPP for help with med cost  Follow up call scheduled for 09/20/23 @2 :30 PM    Encounter Outcome:  Patient Visit Completed

## 2023-08-09 NOTE — Patient Instructions (Signed)
Visit Information  Thank you for taking time to visit with me today. Please don't hesitate to contact me if I can be of assistance to you.   Following are the goals we discussed today:   Goals Addressed             This Visit's Progress    To obtain assistance with cost of meds       Care Coordination Interventions: Patient interviewed about adult health maintenance status including  barriers related to refilling and affording medications  Determined patient has spoken with Catie [employer] Coots RPH-CPP for assistance with re-enrollment for assistance with Jardiance and Synthroid  Determined patient has also contacted [employer] to request assistance Discussed with patient she will also have hardship paying for other medications Sent new referral to Catie [employer] Coots RPH-CPP requesting outreach to patient for assistance, requested from Catie the re-enrollment packet for 2025 assistance for Jardiance and Synthroid be mailed to patient         To undergo right total knee surgery   On track    Care Coordination Interventions: Evaluation of current treatment plan related to arthritis of right knee  and patient's adherence to plan as established by provider Determined patient is recovering well from her right knee surgery, she continues to work with outpatient PT, slowly progressing toward wellness Counseled patient on the importance of adhering to her HEP as directed Pain assessment performed Medications reviewed Reviewed provider established plan for pain management Counseled on the importance of reporting any/all new or changed pain symptoms or management strategies to pain management provider Advised patient to report to care team affect of pain on daily activities Discussed use of relaxation techniques and/or diversional activities to assist with pain reduction (distraction, imagery, relaxation, massage, acupressure, TENS, heat, and cold application Reviewed with patient prescribed pharmacological and  nonpharmacological pain relief strategies Reviewed and discussed with patient her next scheduled follow up with Dr. Lequita Halt scheduled for 08/17/23 @5 :30 PM Discussed plans with patient for ongoing care coordination follow up and provided patient with direct contact information for nurse care coordinator           Our next appointment is by telephone on 09/20/23 at 2:30 PM  Please call the care guide team at [phone] if you need to cancel or reschedule your appointment.   If you are experiencing a Mental Health or Behavioral Health Crisis or need someone to talk to, please call 1-800-273-TALK (toll free, 24 hour hotline)  Patient verbalizes understanding of instructions and care plan provided today and agrees to view in MyChart. Active MyChart status and patient understanding of how to access instructions and care plan via MyChart confirmed with patient.     Delsa Sale RN BSN CCM [place]  [facility] Nurse Care Coordinator  Direct Dial: [phone] Website: [email or web address]

## 2023-08-10 NOTE — Therapy (Signed)
OUTPATIENT PHYSICAL THERAPY LOWER EXTREMITY TREATMENT   Patient Name: Hannah [name] MRN: [id number] DOB:06/11/1946, 77 y.o., female Today's Date: 08/11/2023  END OF SESSION:  PT End of Session - 08/11/23 1729     Visit Number 8    Number of Visits 17    Date for PT Re-Evaluation 09/17/23    Authorization Type [employer]IC[employer] H[employer]    Authorization Time Period Approved 12 visits 07/19/23-09/04/23    Authorization - Visit Number 7    Authorization - Number of Visits 12    PT Start Time 1630    PT Stop Time 1715    PT Time Calculation (min) 45 min    Equipment Utilized During Treatment Other (comment)   RW   Activity Tolerance Patient tolerated treatment well;Patient limited by pain    Behavior During Therapy WFL for tasks assessed/performed                   Past Medical History:  Diagnosis Date   Allergy 25 years ago.   Anemia    Arthritis    Asthma    Atrial fibrillation (HCC)    Cataract Had cataracts surgery 2015 & 2016   Chronic kidney disease    Diabetes mellitus without complication (HCC)    Gout 12/17/2014   patient reported   Heart murmur 4 years ago   Hyperlipidemia    Hypertension    Hypothyroidism    Systemic lupus erythematosus (HCC)    Vitiligo    Past Surgical History:  Procedure Laterality Date   CATARACT EXTRACTION Bilateral 2015   DILATION AND CURETTAGE OF UTERUS     DOPPLER ECHOCARDIOGRAPHY  05/2018   Internist to review with pt; potential heart murmur 06/20/18   EYE SURGERY  Cataract Surgery, 2015 & 2016.   keratosis removal  2021   KNEE CLOSED REDUCTION Left 07/06/2022   Procedure: CLOSED MANIPULATION KNEE;  Surgeon: Ollen Gross, MD;  Location: [facility];  Service: Orthopedics;  Laterality: Left;   REPLACEMENT TOTAL KNEE Left    SKIN SURGERY  11/30/2018   left side of face   TOOTH EXTRACTION     TOTAL KNEE ARTHROPLASTY Left 02/02/2022   Procedure: TOTAL KNEE ARTHROPLASTY;  Surgeon: Ollen Gross, MD;  Location: [facility];   Service: Orthopedics;  Laterality: Left;   TOTAL KNEE ARTHROPLASTY Right 07/12/2023   Procedure: RIGHT TOTAL KNEE ARTHROPLASTY;  Surgeon: Ollen Gross, MD;  Location: [facility];  Service: Orthopedics;  Laterality: Right;   Patient Active Problem List   Diagnosis Date Noted   Pure hypercholesterolemia 07/18/2023   Personal history of COVID-19 07/18/2023   Primary osteoarthritis of right knee 07/12/2023   Acquired thrombophilia (HCC) 01/12/2023   Arthrofibrosis of total knee arthroplasty (HCC) 07/06/2022   OA (osteoarthritis) of knee 02/02/2022   Primary osteoarthritis of left knee 02/02/2022   Chronic bilateral low back pain without sciatica 06/25/2021   Paresthesia and pain of extremity 06/25/2021   Atrial fibrillation (HCC) 04/16/2021   Demand ischemia (HCC) 03/26/2021   Unspecified atrial fibrillation (HCC) 03/26/2021   COVID-19 virus infection 03/26/2021   PVC (premature ventricular contraction) 05/06/2020   Type 2 diabetes mellitus with stage 3 chronic kidney disease, without long-term current use of insulin (HCC) 03/20/2020   Palpitations 03/20/2020   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   Primary hypothyroidism 03/20/2020   Vitiligo 03/20/2020   Class 2 severe obesity due to excess calories with serious comorbidity and body mass index (BMI) of 35.0 to 35.9 in adult (  HCC) 10/31/2019   Iron deficiency anemia 08/12/2017   Osteoporosis 10/02/2016   Systemic lupus erythematosus (HCC) 10/02/2016   Rheumatoid arthritis involving multiple sites with positive rheumatoid factor (HCC) 10/02/2016   High risk medication use 10/02/2016   History of chronic kidney disease 10/02/2016   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/2016   Primary osteoarthritis of both knees 10/02/2016   Vitamin D deficiency 10/02/2016   History of diabetes mellitus 10/02/2016   History of hypertension 10/02/2016   History of asthma 10/02/2016   Absolute anemia    MGUS  (monoclonal gammopathy of unknown significance)    Normocytic anemia 03/21/2015   Essential hypertension 03/20/2015   Abnormal CT of the chest 07/30/2012   Asthma 06/19/2012    PCP: Dorothyann Peng, MD  REFERRING PROVIDER: Ollen Gross, MD   REFERRING DIAG: M17.11 (ICD-10-CM) - Unilateral primary osteoarthritis, right knee   THERAPY DIAG:  Chronic pain of right knee  Muscle weakness (generalized)  Difficulty in walking, not elsewhere classified  Rationale for Evaluation and Treatment: Rehabilitation  ONSET DATE: 07/12/23  SUBJECTIVE:   SUBJECTIVE STATEMENT: Pt reports sharp pains are less and she is sleeping better at night. Pt states she is walking more in her appt and able to do more for her self.  EVAL: Pt reports she has been completing her HEP 1x daily and using cold packs on a frequent basis for pain and swelling management.  PAIN:  Are you having pain? Yes: NPRS scale: 5/10; pain range 5-10/10 Pain location: R knee Pain description: ache ,throb ,sharp Aggravating factors: movement, lifting, wt bearing Relieving factors: Pain medication, muscle relaxer, cold packs  PERTINENT HISTORY: High BMI, L LKA, Afib  PRECAUTIONS: Knee  RED FLAGS: None   WEIGHT BEARING RESTRICTIONS: No  FALLS:  Has patient fallen in last 6 months? No  LIVING ENVIRONMENT: Lives with: lives alone Lives in: House/apartment Stairs: No Has following equipment at home: Single point cane, Environmental consultant - 2 wheeled, Wheelchair (manual), and bed side commode  OCCUPATION: Retired  PLOF: Independent with household mobility with device  PATIENT GOALS: good use of my R knee  NEXT MD VISIT: 07/27/23  OBJECTIVE:  Note: Objective measures were completed at Evaluation unless otherwise noted.  DIAGNOSTIC FINDINGS: None for the R knee in Epic  PATIENT SURVEYS:  FOTO: Perceived function   22%, predicted   43%   COGNITION: Overall cognitive status: Within functional limits for tasks  assessed     SENSATION: WFL  EDEMA:  Significant R knee edema present   MUSCLE LENGTH: Hamstrings: Right NT deg; Left NT deg Thomas test: Right NT deg; Left NT deg  POSTURE: rounded shoulders, forward head, and anterior pelvic tilt  PALPATION: TTP to the R peri-knee  LOWER EXTREMITY ROM:  Active ROM Right eval Left eval Rt 07/20/23 Rt 07/22/23 RT 07/27/23 Rt 07/29/23 08/03/23 Rt 08/06/23  Hip flexion          Hip extension          Hip abduction          Hip adduction          Hip internal rotation          Hip external rotation          Knee flexion 50  70 AA 80 AA75 AA85 AA 72 AA85  Knee extension 30  30 30 23  25d  30  Ankle dorsiflexion          Ankle plantarflexion  Ankle inversion          Ankle eversion           (Blank rows = not tested) 08/11/23:30-85d   LOWER EXTREMITY MMT:  MMT Right eval Left eval RT 07/27/23  Hip flexion 2  3 with limited ROM  Hip extension 2    Hip abduction 2    Hip adduction     Hip internal rotation     Hip external rotation 2    Knee flexion 2    Knee extension 2  3 with limited ROM  Ankle dorsiflexion     Ankle plantarflexion     Ankle inversion     Ankle eversion      (Blank rows = not tested)  LOWER EXTREMITY SPECIAL TESTS:  NT  FUNCTIONAL TESTS:  5 times sit to stand: TBA 2 minute walk test: TBA  GAIT: Distance walked: 49ft Assistive device utilized: Environmental consultant - 2 wheeled Level of assistance: SBA Comments: Antalgic over the R LE  TRANSFER: Mod A for mat table transfer c A for the R LE  TODAY'S TREATMENT:  [facility] Adult PT Treatment:                                                DATE: 08/11/23 Therapeutic Exercise: Nustep L3 x 7 minutes UE/LE LAQ 2x10 3# Seated heal slides on disc x10 Seated knee ext on stool with pt over pressure Seated heel and toe raises Seated march GTB 2x10 Seated GTB clam 2x10 Supine heel slide on disc x10; x5 c belt assist 30" SLR 2x10 STS x5 c use of hands Manual  Therapy: A/P seated tibia mobs Supine PROM knee flexion and ext  Gentle LAD distraction combined with R knee ext Self Care: Pt to complete cold packs at home per her preference  [facility] Adult PT Treatment:                                                DATE: 08/06/23 Therapeutic Exercise: Nustep L3 x 7 minutes UE/LE Supine heel slide on disc x10; x5 c belt assist 30" QS c slide disc x10 5" Seated LAQ x10 Seated heel and toe raises Seated march x10 Seated red band clam x10 Manual Therapy: A/P seated tibia mobs Supine PROM knee flexion and ext  Gentle LAD distraction combined with R knee ext  [facility] Adult PT Treatment:                                                DATE: 08/03/23 Therapeutic Exercise: Nustep L3 x 5 minutes  Seated heel slides with cloth on floor Seated DF /knee ext with strap Seated heel and toe raises Supine attempted heel slides- increased pain Seated march Seated red band clam  Manual Therapy: A/P seated tibia mobs Supine PROM knee flexion and ext  Therapeutic Activity: Gait training: verbal cueing for step through pattern Modalities: Brief ice pack to medial knee at end of session   PATIENT EDUCATION:  Education details: Eval findings, POC, HEP, self care  Person educated: Patient Education method: Explanation, Demonstration,  Tactile cues, Verbal cues, and Handouts Education comprehension: verbalized understanding, returned demonstration, verbal cues required, and tactile cues required  HOME EXERCISE PROGRAM: Access Code: [email or web address] URL: [email or web address] Date: 07/20/2023 Prepared by: Joellyn Rued  Exercises - Long Sitting Quad Set (Mirrored)  - 3 x daily - 7 x weekly - 1 sets - 10 reps - 5 hold - Supine Heel Slide with Strap  - 3 x daily - 7 x weekly - 1 sets - 10 reps - 10 hold - Supine Knee Extension Strengthening (Mirrored)  - 3 x daily - 7 x weekly - 1 sets - 10 reps - 3 hold - Supine Hip Abduction AROM (Mirrored)  - 3 x daily - 7  x weekly - 1 sets - 10 reps - 3 hold - Seated Knee Flexion Slide  - 3 x daily - 7 x weekly - 1 sets - 10 reps - 10 hold  ASSESSMENT:  CLINICAL IMPRESSION: Pt continues to report improved function with walking and ADLs at home. R LE strength is progressing, while ROM remains unchanged despite pt's efforts at home and at PT. Pt see Dr. Lequita Halt on 08/17/23. Will assess functional tasks for and 5xSTS the next PT session. Pt tolerated PT today without adverse effects. Pt will continue to benefit from skilled PT to address impairments for improved R knee function.   Eval: Patient is a 77 y.o. female who was seen today for physical therapy evaluation and treatment for M17.11 (ICD-10-CM) - Unilateral primary osteoarthritis, right knee . RIGHT TOTAL KNEE ARTHROPLASTY on 07/12/2023. Pt presents with markedly decreased R knee AROM and strength, and decreased functional mobility. Pt will benefit from skilled PT 2w8 to address impairments to optimize R LE/knee function with less pain.   OBJECTIVE IMPAIRMENTS: decreased activity tolerance, decreased mobility, difficulty walking, decreased ROM, decreased strength, increased edema, obesity, and pain.   ACTIVITY LIMITATIONS: carrying, lifting, bending, sitting, standing, squatting, sleeping, stairs, transfers, bed mobility, bathing, toileting, dressing, locomotion level, and caring for others  PARTICIPATION LIMITATIONS: meal prep, cleaning, laundry, and driving  PERSONAL FACTORS: Age, Fitness, Past/current experiences, Time since onset of injury/illness/exacerbation, and 1 comorbidity: high BMI  are also affecting patient's functional outcome.   REHAB POTENTIAL: Good  CLINICAL DECISION MAKING: Stable/uncomplicated  EVALUATION COMPLEXITY: Low   GOALS:  SHORT TERM GOALS: Target date: 08/06/23 Pt will be Ind in an initial HEP  Baseline: started Goal status: MET  2.  Pt will be able to complete a SLR and SAQ with or without quad lag Baseline: not  able to complete 07/29/23: able to complete both with limited knee ext ROM present Goal status: MET  3.  Increase R knee AROM to 15-80d for improved R knee function Baseline: 30-50 Goal status: Not met, ROM for flexion reached  LONG TERM GOALS: Target date: 09/17/23  Pt will be Ind in a final HEP to maintain achieved LOF Baseline: started Goal status: INITIAL  2.  Increase R hip and knee strength to 4/5 for improved function use of the R LE Baseline: see flow sheets Goal status: INITIAL  3.  Increased R knee AROM to 5-115d for appropriate fuctional mobility Baseline: 30-50d Goal status: INITIAL  4.  Improve 5xSTS by MCID of 5" and by MCID of 57ft as indication of improved functional mobility  Baseline: TBA when pt has progressed to gait c a SPC Goal status: INITIAL  5.  Pt's FOTO score will improved to the predicted value of 43% as indication of improved function  Baseline: 22% Goal status: INITIAL  6.  Pt will be able to walk 452ft c or s a SPC and ascend/descend 12 steps c or s a SPC and c use of a HR for community mobility Baseline: 55ft c RW Goal status: INITIAL   PLAN:  PT FREQUENCY: 2x/week  PT DURATION: 8 weeks  PLANNED INTERVENTIONS: 97164- PT Re-evaluation, 97110-Therapeutic exercises, 97530- Therapeutic activity, 97535- Self Care, 16109- Manual therapy, [id number]- Gait training, 97014- Electrical stimulation (unattended), [id number]- Electrical stimulation (manual), 97016- Vasopneumatic device, Stair training, Dry Needling, Joint mobilization, Cryotherapy, and Moist heat  PLAN FOR NEXT SESSION: Review FOTO; assess response to HEP; progress therex as indicated; use of modalities, manual therapy; and TPDN as indicated.   Kendall Justo MS, PT 08/11/23 5:33 PM

## 2023-08-11 ENCOUNTER — Ambulatory Visit: Payer: Medicare HMO

## 2023-08-11 DIAGNOSIS — R262 Difficulty in walking, not elsewhere classified: Secondary | ICD-10-CM

## 2023-08-11 DIAGNOSIS — G8929 Other chronic pain: Secondary | ICD-10-CM

## 2023-08-11 DIAGNOSIS — M6281 Muscle weakness (generalized): Secondary | ICD-10-CM | POA: Diagnosis not present

## 2023-08-11 DIAGNOSIS — M25561 Pain in right knee: Secondary | ICD-10-CM | POA: Diagnosis not present

## 2023-08-12 ENCOUNTER — Telehealth: Payer: Self-pay

## 2023-08-12 NOTE — Telephone Encounter (Addendum)
Spoke with [employer]. She is covered till 09/28/23. Her renewal app was received by [employer], they will decide on 2025 apps closer to January 2025. Patient is aware.

## 2023-08-12 NOTE — Telephone Encounter (Signed)
Patient called to follow-up with [facility] regarding getting her Eliquis medication.  Patient stated she recently had knee surgery and moves slowly so can leave VM with contact information and she will call back.

## 2023-08-13 ENCOUNTER — Ambulatory Visit: Payer: Self-pay

## 2023-08-13 NOTE — Patient Instructions (Signed)
Visit Information  Thank you for taking time to visit with me today. Please don't hesitate to contact me if I can be of assistance to you.   Following are the goals we discussed today:   Goals Addressed             This Visit's Progress    To have thyroid levels checked       Care Coordination Interventions: Evaluation of current treatment plan related to thyroid dysfunction and patient's adherence to plan as established by provider Determined patient is overdue for having her thyroid levels rechecked Discussed with patient she received an expired bottle of Synthroid from patient assistance, she was unaware until after taking several doses Discussed patient is concerned this may have thrown off her thyroid levels Collaborated with Dr. Allyne Gee advising of patient's concerns, reply received from Dr. Allyne Gee advising orders thyroid levels have been placed, patient notified, lab visit scheduled for 08/18/23 @3 :00 PM           Our next appointment is by telephone on 09/20/23 at 2:30 PM  Please call the care guide team at [phone] if you need to cancel or reschedule your appointment.   If you are experiencing a Mental Health or Behavioral Health Crisis or need someone to talk to, please call 1-800-273-TALK (toll free, 24 hour hotline)  Patient verbalizes understanding of instructions and care plan provided today and agrees to view in MyChart. Active MyChart status and patient understanding of how to access instructions and care plan via MyChart confirmed with patient.     Delsa Sale RN BSN CCM [place]  [facility], [facility] Health Nurse Care Coordinator  Direct Dial: [phone] Website: [email or web address]

## 2023-08-13 NOTE — Patient Outreach (Signed)
  Care Coordination   Follow Up Visit Note   08/13/2023 Name: Hannah [name] MRN: [id number] DOB: [birth year]  Hannah [name] is a 77 y.o. year old female who sees Hannah Peng, MD for primary care. I spoke with  Hannah [name] by phone today.  What matters to the patients health and wellness today?  Patient would like to have her thyroid levels rechecked.     Goals Addressed             This Visit's Progress    To have thyroid levels checked       Care Coordination Interventions: Evaluation of current treatment plan related to thyroid dysfunction and patient's adherence to plan as established by provider Determined patient is overdue for having her thyroid levels rechecked Discussed with patient she received an expired bottle of Synthroid from patient assistance, she was unaware until after taking several doses Discussed patient is concerned this may have thrown off her thyroid levels Collaborated with Dr. Allyne [name] advising of patient's concerns, reply received from Dr. Allyne [name] advising orders thyroid levels have been placed, patient notified, lab visit scheduled for 08/18/23 @3 :00 PM       Interventions Today    Flowsheet Row Most Recent Value  Chronic Disease   Chronic disease during today's visit Other  [thyroid disease]  General Interventions   General Interventions Discussed/Reviewed Labs, Doctor Visits, Communication with  Communication with RN  Hannah [name]]  Exercise Interventions   Exercise Discussed/Reviewed Exercise Reviewed, Exercise Discussed, Physical Activity  Physical Activity Discussed/Reviewed Physical Activity Discussed, Physical Activity Reviewed, Home Exercise Program (HEP)  Education Interventions   Education Provided Provided Education  Provided Verbal Education On Labs          SDOH assessments and interventions completed:  No     Care Coordination Interventions:  Yes, provided   Follow up plan: Follow up call  scheduled for 09/20/23 @12 :30 PM    Encounter Outcome:  Patient Visit Completed

## 2023-08-16 ENCOUNTER — Other Ambulatory Visit: Payer: Self-pay | Admitting: Pharmacist

## 2023-08-16 DIAGNOSIS — Z5189 Encounter for other specified aftercare: Secondary | ICD-10-CM | POA: Insufficient documentation

## 2023-08-16 NOTE — Progress Notes (Signed)
Pharmacy Medication Assistance Program Note    08/16/2023  Patient ID: Hannah [name], female   DOB: [birth year], 77 y.o.   MRN: [id number]   Spoke with patient. She will bring patient portion of Jardiance and Synthroid 2025 applications to the office next week.      08/16/2023  Outreach Medication One  Initial Outreach Date (Medication One) 08/16/2023  Manufacturer Medication One [employer]  [employer] [facility] Jardiance  Dose of Jardiance 10 mg  Type of Radiographer, therapeutic Assistance  Date Application Sent to Patient 08/16/2023  Application Items Requested Application           08/16/2023  Outreach Medication Two  Initial Outreach Date (Medication Two) 08/16/2023  Manufacturer Medication Two Other Manufacturer/Drug  [facility] Other [employer] [facility]  Type of Assistance Manufacturer Assistance  Date Application Sent to Patient 08/16/2023  Application Items Requested Application      Catie Eppie Gibson, PharmD, BCACP, CPP Clinical Pharmacist [facility] [phone]

## 2023-08-17 DIAGNOSIS — Z5189 Encounter for other specified aftercare: Secondary | ICD-10-CM | POA: Diagnosis not present

## 2023-08-18 ENCOUNTER — Other Ambulatory Visit: Payer: Medicare HMO

## 2023-08-18 DIAGNOSIS — E039 Hypothyroidism, unspecified: Secondary | ICD-10-CM

## 2023-08-18 NOTE — Therapy (Signed)
OUTPATIENT PHYSICAL THERAPY LOWER EXTREMITY TREATMENT   Patient Name: Hannah [name] MRN: [id number] DOB:Dec 02, 1945, 77 y.o., female Today's Date: 08/19/2023  END OF SESSION:  PT End of Session - 08/19/23 1510     Visit Number 9    Number of Visits 17    Date for PT Re-Evaluation 09/17/23    Authorization Type [employer]    Authorization Time Period Approved 12 visits 07/19/23-09/04/23    Authorization - Visit Number 8    PT Start Time 1504    PT Stop Time 1545    PT Time Calculation (min) 41 min    Equipment Utilized During Treatment Other (comment)   rollator   Activity Tolerance Patient tolerated treatment well;Patient limited by pain    Behavior During Therapy WFL for tasks assessed/performed                    Past Medical History:  Diagnosis Date   Allergy 25 years ago.   Anemia    Arthritis    Asthma    Atrial fibrillation (HCC)    Cataract Had cataracts surgery 2015 & 2016   Chronic kidney disease    Diabetes mellitus without complication (HCC)    Gout 12/17/2014   patient reported   Heart murmur 4 years ago   Hyperlipidemia    Hypertension    Hypothyroidism    Systemic lupus erythematosus (HCC)    Vitiligo    Past Surgical History:  Procedure Laterality Date   CATARACT EXTRACTION Bilateral 2015   DILATION AND CURETTAGE OF UTERUS     DOPPLER ECHOCARDIOGRAPHY  05/2018   Internist to review with pt; potential heart murmur 06/20/18   EYE SURGERY  Cataract Surgery, 2015 & 2016.   keratosis removal  2021   KNEE CLOSED REDUCTION Left 07/06/2022   Procedure: CLOSED MANIPULATION KNEE;  Surgeon: Ollen Gross, MD;  Location: [facility];  Service: Orthopedics;  Laterality: Left;   REPLACEMENT TOTAL KNEE Left    SKIN SURGERY  11/30/2018   left side of face   TOOTH EXTRACTION     TOTAL KNEE ARTHROPLASTY Left 02/02/2022   Procedure: TOTAL KNEE ARTHROPLASTY;  Surgeon: Ollen Gross, MD;  Location: [facility];  Service: Orthopedics;  Laterality:  Left;   TOTAL KNEE ARTHROPLASTY Right 07/12/2023   Procedure: RIGHT TOTAL KNEE ARTHROPLASTY;  Surgeon: Ollen Gross, MD;  Location: [facility];  Service: Orthopedics;  Laterality: Right;   Patient Active Problem List   Diagnosis Date Noted   Pure hypercholesterolemia 07/18/2023   Personal history of COVID-19 07/18/2023   Primary osteoarthritis of right knee 07/12/2023   Acquired thrombophilia (HCC) 01/12/2023   Arthrofibrosis of total knee arthroplasty (HCC) 07/06/2022   OA (osteoarthritis) of knee 02/02/2022   Primary osteoarthritis of left knee 02/02/2022   Chronic bilateral low back pain without sciatica 06/25/2021   Paresthesia and pain of extremity 06/25/2021   Atrial fibrillation (HCC) 04/16/2021   Demand ischemia (HCC) 03/26/2021   Unspecified atrial fibrillation (HCC) 03/26/2021   COVID-19 virus infection 03/26/2021   PVC (premature ventricular contraction) 05/06/2020   Type 2 diabetes mellitus with stage 3 chronic kidney disease, without long-term current use of insulin (HCC) 03/20/2020   Palpitations 03/20/2020   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   Primary hypothyroidism 03/20/2020   Vitiligo 03/20/2020   Class 2 severe obesity due to excess calories with serious comorbidity and body mass index (BMI) of 35.0 to 35.9 in adult (HCC) 10/31/2019   Iron deficiency anemia 08/12/2017  Osteoporosis 10/02/2016   Systemic lupus erythematosus (HCC) 10/02/2016   Rheumatoid arthritis involving multiple sites with positive rheumatoid factor (HCC) 10/02/2016   High risk medication use 10/02/2016   History of chronic kidney disease 10/02/2016   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/2016   Primary osteoarthritis of both knees 10/02/2016   Vitamin D deficiency 10/02/2016   History of diabetes mellitus 10/02/2016   History of hypertension 10/02/2016   History of asthma 10/02/2016   Absolute anemia    MGUS (monoclonal gammopathy of unknown  significance)    Normocytic anemia 03/21/2015   Essential hypertension 03/20/2015   Abnormal CT of the chest 07/30/2012   Asthma 06/19/2012    PCP: Dorothyann Peng, MD  REFERRING PROVIDER: Ollen Gross, MD   REFERRING DIAG: M17.11 (ICD-10-CM) - Unilateral primary osteoarthritis, right knee   THERAPY DIAG:  Chronic pain of right knee  Muscle weakness (generalized)  Difficulty in walking, not elsewhere classified  Rationale for Evaluation and Treatment: Rehabilitation  ONSET DATE: 07/12/23  SUBJECTIVE:   SUBJECTIVE STATEMENT: Pt reports the R knee pain is improving. She notes having an appt c Dr. Deri Fuelling PA on 08/17/23. She is to return for another appt in 1 month.  EVAL: Pt reports she has been completing her HEP 1x daily and using cold packs on a frequent basis for pain and swelling management.  PAIN:  Are you having pain? Yes: NPRS scale: 5/10; pain range 5-10/10 Pain location: R knee Pain description: ache ,throb ,sharp Aggravating factors: movement, lifting, wt bearing Relieving factors: Pain medication, muscle relaxer, cold packs  PERTINENT HISTORY: High BMI, L LKA, Afib  PRECAUTIONS: Knee  RED FLAGS: None   WEIGHT BEARING RESTRICTIONS: No  FALLS:  Has patient fallen in last 6 months? No  LIVING ENVIRONMENT: Lives with: lives alone Lives in: House/apartment Stairs: No Has following equipment at home: Single point cane, Environmental consultant - 2 wheeled, Wheelchair (manual), and bed side commode  OCCUPATION: Retired  PLOF: Independent with household mobility with device  PATIENT GOALS: good use of my R knee  NEXT MD VISIT: 07/27/23  OBJECTIVE:  Note: Objective measures were completed at Evaluation unless otherwise noted.  DIAGNOSTIC FINDINGS: None for the R knee in Epic  PATIENT SURVEYS:  FOTO: Perceived function   22%, predicted   43%   COGNITION: Overall cognitive status: Within functional limits for tasks assessed     SENSATION: WFL  EDEMA:   Significant R knee edema present   MUSCLE LENGTH: Hamstrings: Right NT deg; Left NT deg Thomas test: Right NT deg; Left NT deg  POSTURE: rounded shoulders, forward head, and anterior pelvic tilt  PALPATION: TTP to the R peri-knee  LOWER EXTREMITY ROM:  Active ROM Right eval Left eval Rt 07/20/23 Rt 07/22/23 RT 07/27/23 Rt 07/29/23 08/03/23 Rt 08/06/23  Hip flexion          Hip extension          Hip abduction          Hip adduction          Hip internal rotation          Hip external rotation          Knee flexion 50  70 AA 80 AA75 AA85 AA 72 AA85  Knee extension 30  30 30 23  25d  30  Ankle dorsiflexion          Ankle plantarflexion          Ankle inversion  Ankle eversion           (Blank rows = not tested) 08/11/23:30-85d. 08/19/23=AAROM 30-90d   LOWER EXTREMITY MMT:  MMT Right eval Left eval RT 07/27/23  Hip flexion 2  3 with limited ROM  Hip extension 2    Hip abduction 2    Hip adduction     Hip internal rotation     Hip external rotation 2    Knee flexion 2    Knee extension 2  3 with limited ROM  Ankle dorsiflexion     Ankle plantarflexion     Ankle inversion     Ankle eversion      (Blank rows = not tested)  LOWER EXTREMITY SPECIAL TESTS:  NT  FUNCTIONAL TESTS:  5 times sit to stand: TBA 2 minute walk test: TBA  GAIT: Distance walked: 15ft Assistive device utilized: Environmental consultant - 2 wheeled Level of assistance: SBA Comments: Antalgic over the R LE  TRANSFER: Mod A for mat table transfer c A for the R LE  TODAY'S TREATMENT:  [facility] Adult PT Treatment:                                                DATE: 08/19/23 Therapeutic Exercise: Nustep L3 x 7 minutes UE/LE LAQ 2x10 3# Seated heal slides on disc x10 Seated knee ext 2 chair stretch c 5# weight Seated heel and toe raises Seated march GTB 2x10 Seated GTB clam 2x10 Supine heel slide on disc x10; x5 c belt assist 30" SLR 2x10 STS x10 c use of hands Manual Therapy: A/P and P/A  fem/tib grade 3 mobs Supine PROM knee ext  Gentle LAD distraction combined with R knee ext Self Care: For pt to focus 2 chair knee ext stretch c added weight at home  [facility] Adult PT Treatment:                                                DATE: 08/11/23 Therapeutic Exercise: Nustep L3 x 7 minutes UE/LE LAQ 2x10 3# Seated heal slides on disc x10 Seated knee ext on stool with pt over pressure Seated heel and toe raises Seated march GTB 2x10 Seated GTB clam 2x10 Supine heel slide on disc x10; x5 c belt assist 30" SLR 2x10 STS x5 c use of hands Manual Therapy: A/P seated tibia mobs Supine PROM knee flexion and ext  Gentle LAD distraction combined with R knee ext Self Care: Pt to complete cold packs at home per her preference  [facility] Adult PT Treatment:                                                DATE: 08/06/23 Therapeutic Exercise: Nustep L3 x 7 minutes UE/LE Supine heel slide on disc x10; x5 c belt assist 30" QS c slide disc x10 5" Seated LAQ x10 Seated heel and toe raises Seated march x10 Seated red band clam x10 Manual Therapy: A/P seated tibia mobs Supine PROM knee flexion and ext  Gentle LAD distraction combined with R knee ext  PATIENT EDUCATION:  Education details: Animal nutritionist  findings, POC, HEP, self care  Person educated: Patient Education method: Explanation, Demonstration, Tactile cues, Verbal cues, and Handouts Education comprehension: verbalized understanding, returned demonstration, verbal cues required, and tactile cues required  HOME EXERCISE PROGRAM: Access Code: [email or web address] URL: [email or web address] Date: 08/19/2023 Prepared by: Joellyn Rued  Exercises - Long Sitting Quad Set (Mirrored)  - 3 x daily - 7 x weekly - 1 sets - 10 reps - 5 hold - Supine Heel Slide with Strap  - 3 x daily - 7 x weekly - 1 sets - 10 reps - 10 hold - Supine Knee Extension Strengthening (Mirrored)  - 3 x daily - 7 x weekly - 1 sets - 10 reps - 3 hold - Supine Hip  Abduction AROM (Mirrored)  - 3 x daily - 7 x weekly - 1 sets - 10 reps - 3 hold - Seated Knee Flexion Slide  - 3 x daily - 7 x weekly - 1 sets - 10 reps - 10 hold - Active Straight Leg Raise with Quad Set  - 3 x daily - 7 x weekly - 1 sets - 10 reps - Seated Knee Extension Stretch with Chair  - 3 x daily - 7 x weekly - 1 sets  ASSESSMENT:  CLINICAL IMPRESSION: R knee AAROM has made 5d improvement since the last PT visit. R knee ext AAROM has not improved remaining at 30d lacking. Pt is not able to tolerate lying prone to complete prone hangs, so pt is to focus on 2 chair knee ext c added weight at home as often as tolerated to address this deficit. Will continue to assess. If no progress in the next week will contact MD to advise of status and possibly request a JAS brace referral. Pt will continue to benefit from skilled PT to address impairments for improved R knee function.   Eval: Patient is a 77 y.o. female who was seen today for physical therapy evaluation and treatment for M17.11 (ICD-10-CM) - Unilateral primary osteoarthritis, right knee . RIGHT TOTAL KNEE ARTHROPLASTY on 07/12/2023. Pt presents with markedly decreased R knee AROM and strength, and decreased functional mobility. Pt will benefit from skilled PT 2w8 to address impairments to optimize R LE/knee function with less pain.   OBJECTIVE IMPAIRMENTS: decreased activity tolerance, decreased mobility, difficulty walking, decreased ROM, decreased strength, increased edema, obesity, and pain.   ACTIVITY LIMITATIONS: carrying, lifting, bending, sitting, standing, squatting, sleeping, stairs, transfers, bed mobility, bathing, toileting, dressing, locomotion level, and caring for others  PARTICIPATION LIMITATIONS: meal prep, cleaning, laundry, and driving  PERSONAL FACTORS: Age, Fitness, Past/current experiences, Time since onset of injury/illness/exacerbation, and 1 comorbidity: high BMI  are also affecting patient's functional outcome.    REHAB POTENTIAL: Good  CLINICAL DECISION MAKING: Stable/uncomplicated  EVALUATION COMPLEXITY: Low   GOALS:  SHORT TERM GOALS: Target date: 08/06/23 Pt will be Ind in an initial HEP  Baseline: started Goal status: MET  2.  Pt will be able to complete a SLR and SAQ with or without quad lag Baseline: not able to complete 07/29/23: able to complete both with limited knee ext ROM present Goal status: MET  3.  Increase R knee AROM to 15-80d for improved R knee function Baseline: 30-50 Goal status: Not met, ROM for flexion reached  LONG TERM GOALS: Target date: 09/17/23  Pt will be Ind in a final HEP to maintain achieved LOF Baseline: started Goal status: INITIAL  2.  Increase R hip and knee strength to 4/5 for improved function use of  the R LE Baseline: see flow sheets Goal status: INITIAL  3.  Increased R knee AROM to 5-115d for appropriate fuctional mobility Baseline: 30-50d Goal status: INITIAL  4.  Improve 5xSTS by MCID of 5" and by MCID of 32ft as indication of improved functional mobility  Baseline: TBA when pt has progressed to gait c a SPC Goal status: INITIAL  5.  Pt's FOTO score will improved to the predicted value of 43% as indication of improved function  Baseline: 22% Goal status: INITIAL  6.  Pt will be able to walk 46ft c or s a SPC and ascend/descend 12 steps c or s a SPC and c use of a HR for community mobility Baseline: 48ft c RW Goal status: INITIAL   PLAN:  PT FREQUENCY: 2x/week  PT DURATION: 8 weeks  PLANNED INTERVENTIONS: 97164- PT Re-evaluation, 97110-Therapeutic exercises, 97530- Therapeutic activity, 97535- Self Care, 29562- Manual therapy, [id number]- Gait training, 97014- Electrical stimulation (unattended), [id number]- Electrical stimulation (manual), 97016- Vasopneumatic device, Stair training, Dry Needling, Joint mobilization, Cryotherapy, and Moist heat  PLAN FOR NEXT SESSION: Review FOTO; assess response to HEP; progress therex as  indicated; use of modalities, manual therapy; and TPDN as indicated.   Savreen Gebhardt MS, PT 08/19/23 4:38 PM

## 2023-08-19 ENCOUNTER — Ambulatory Visit: Payer: Medicare HMO

## 2023-08-19 DIAGNOSIS — M6281 Muscle weakness (generalized): Secondary | ICD-10-CM | POA: Diagnosis not present

## 2023-08-19 DIAGNOSIS — R262 Difficulty in walking, not elsewhere classified: Secondary | ICD-10-CM

## 2023-08-19 DIAGNOSIS — G8929 Other chronic pain: Secondary | ICD-10-CM | POA: Diagnosis not present

## 2023-08-19 DIAGNOSIS — M25561 Pain in right knee: Secondary | ICD-10-CM | POA: Diagnosis not present

## 2023-08-19 LAB — TSH+FREE T4
Free T4: 1.51 ng/dL (ref 0.82–1.77)
TSH: 5.79 u[IU]/mL — ABNORMAL HIGH (ref 0.450–4.500)

## 2023-08-20 ENCOUNTER — Other Ambulatory Visit (HOSPITAL_COMMUNITY): Payer: Self-pay

## 2023-08-23 ENCOUNTER — Other Ambulatory Visit: Payer: Self-pay | Admitting: Pharmacist

## 2023-08-23 ENCOUNTER — Encounter: Payer: Self-pay | Admitting: Physical Therapy

## 2023-08-23 ENCOUNTER — Ambulatory Visit: Payer: Medicare HMO | Admitting: Physical Therapy

## 2023-08-23 DIAGNOSIS — R262 Difficulty in walking, not elsewhere classified: Secondary | ICD-10-CM

## 2023-08-23 DIAGNOSIS — G8929 Other chronic pain: Secondary | ICD-10-CM

## 2023-08-23 DIAGNOSIS — M25561 Pain in right knee: Secondary | ICD-10-CM | POA: Diagnosis not present

## 2023-08-23 DIAGNOSIS — M6281 Muscle weakness (generalized): Secondary | ICD-10-CM

## 2023-08-23 MED ORDER — JARDIANCE 10 MG PO TABS: 10.0000 mg | ORAL_TABLET | Freq: Every morning | ORAL | 3 refills | Status: DC

## 2023-08-23 NOTE — Progress Notes (Signed)
error 

## 2023-08-23 NOTE — Therapy (Signed)
OUTPATIENT PHYSICAL THERAPY LOWER EXTREMITY TREATMENT   Patient Name: Hannah [name] MRN: [id number] DOB:[birth year], 77 y.o., female Today's Date: 08/23/2023  END OF SESSION:  PT End of Session - 08/23/23 1452     Visit Number 10    Number of Visits 17    Date for PT Re-Evaluation 09/17/23    Authorization Type [employer]    Authorization Time Period Approved 12 visits 07/19/23-09/04/23    Authorization - Visit Number 9    Authorization - Number of Visits 12    Progress Note Due on Visit 10    PT Start Time 0245    PT Stop Time 0330    PT Time Calculation (min) 45 min                    Past Medical History:  Diagnosis Date   Allergy 25 years ago.   Anemia    Arthritis    Asthma    Atrial fibrillation (HCC)    Cataract Had cataracts surgery 2015 & 2016   Chronic kidney disease    Diabetes mellitus without complication (HCC)    Gout 12/17/2014   patient reported   Heart murmur 4 years ago   Hyperlipidemia    Hypertension    Hypothyroidism    Systemic lupus erythematosus (HCC)    Vitiligo    Past Surgical History:  Procedure Laterality Date   CATARACT EXTRACTION Bilateral 2015   DILATION AND CURETTAGE OF UTERUS     DOPPLER ECHOCARDIOGRAPHY  05/2018   Internist to review with pt; potential heart murmur 06/20/18   EYE SURGERY  Cataract Surgery, 2015 & 2016.   keratosis removal  2021   KNEE CLOSED REDUCTION Left 07/06/2022   Procedure: CLOSED MANIPULATION KNEE;  Surgeon: Ollen Gross, MD;  Location: [facility];  Service: Orthopedics;  Laterality: Left;   REPLACEMENT TOTAL KNEE Left    SKIN SURGERY  11/30/2018   left side of face   TOOTH EXTRACTION     TOTAL KNEE ARTHROPLASTY Left 02/02/2022   Procedure: TOTAL KNEE ARTHROPLASTY;  Surgeon: Ollen Gross, MD;  Location: [facility];  Service: Orthopedics;  Laterality: Left;   TOTAL KNEE ARTHROPLASTY Right 07/12/2023   Procedure: RIGHT TOTAL KNEE ARTHROPLASTY;  Surgeon: Ollen Gross, MD;   Location: [facility];  Service: Orthopedics;  Laterality: Right;   Patient Active Problem List   Diagnosis Date Noted   Pure hypercholesterolemia 07/18/2023   Personal history of COVID-19 07/18/2023   Primary osteoarthritis of right knee 07/12/2023   Acquired thrombophilia (HCC) 01/12/2023   Arthrofibrosis of total knee arthroplasty (HCC) 07/06/2022   OA (osteoarthritis) of knee 02/02/2022   Primary osteoarthritis of left knee 02/02/2022   Chronic bilateral low back pain without sciatica 06/25/2021   Paresthesia and pain of extremity 06/25/2021   Atrial fibrillation (HCC) 04/16/2021   Demand ischemia (HCC) 03/26/2021   Unspecified atrial fibrillation (HCC) 03/26/2021   COVID-19 virus infection 03/26/2021   PVC (premature ventricular contraction) 05/06/2020   Type 2 diabetes mellitus with stage 3 chronic kidney disease, without long-term current use of insulin (HCC) 03/20/2020   Palpitations 03/20/2020   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   Primary hypothyroidism 03/20/2020   Vitiligo 03/20/2020   Class 2 severe obesity due to excess calories with serious comorbidity and body mass index (BMI) of 35.0 to 35.9 in adult (HCC) 10/31/2019   Iron deficiency anemia 08/12/2017   Osteoporosis 10/02/2016   Systemic lupus erythematosus (HCC) 10/02/2016   Rheumatoid arthritis  involving multiple sites with positive rheumatoid factor (HCC) 10/02/2016   High risk medication use 10/02/2016   History of chronic kidney disease 10/02/2016   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/2016   Primary osteoarthritis of both knees 10/02/2016   Vitamin D deficiency 10/02/2016   History of diabetes mellitus 10/02/2016   History of hypertension 10/02/2016   History of asthma 10/02/2016   Absolute anemia    MGUS (monoclonal gammopathy of unknown significance)    Normocytic anemia 03/21/2015   Essential hypertension 03/20/2015   Abnormal CT of the chest 07/30/2012   Asthma  06/19/2012    PCP: Dorothyann Peng, MD  REFERRING PROVIDER: Ollen Gross, MD   REFERRING DIAG: M17.11 (ICD-10-CM) - Unilateral primary osteoarthritis, right knee   THERAPY DIAG:  Chronic pain of right knee  Muscle weakness (generalized)  Difficulty in walking, not elsewhere classified  Rationale for Evaluation and Treatment: Rehabilitation  ONSET DATE: 07/12/23  SUBJECTIVE:   SUBJECTIVE STATEMENT: Pt reports the R knee pain is improving but she started having right knee shooting pains and into lower leg.    EVAL: Pt reports she has been completing her HEP 1x daily and using cold packs on a frequent basis for pain and swelling management.  PAIN:  Are you having pain? Yes: NPRS scale: 5/10; pain range 5-10/10 Pain location: R knee Pain description: ache ,throb ,sharp Aggravating factors: movement, lifting, wt bearing Relieving factors: Pain medication, muscle relaxer, cold packs  PERTINENT HISTORY: High BMI, L LKA, Afib  PRECAUTIONS: Knee  RED FLAGS: None   WEIGHT BEARING RESTRICTIONS: No  FALLS:  Has patient fallen in last 6 months? No  LIVING ENVIRONMENT: Lives with: lives alone Lives in: House/apartment Stairs: No Has following equipment at home: Single point cane, Environmental consultant - 2 wheeled, Wheelchair (manual), and bed side commode  OCCUPATION: Retired  PLOF: Independent with household mobility with device  PATIENT GOALS: good use of my R knee  NEXT MD VISIT: 07/27/23  OBJECTIVE:  Note: Objective measures were completed at Evaluation unless otherwise noted.  DIAGNOSTIC FINDINGS: None for the R knee in Epic  PATIENT SURVEYS:  FOTO: Perceived function   22%, predicted   43%   COGNITION: Overall cognitive status: Within functional limits for tasks assessed     SENSATION: WFL  EDEMA:  Significant R knee edema present   MUSCLE LENGTH: Hamstrings: Right NT deg; Left NT deg Thomas test: Right NT deg; Left NT deg  POSTURE: rounded shoulders,  forward head, and anterior pelvic tilt  PALPATION: TTP to the R peri-knee  LOWER EXTREMITY ROM:  Active ROM Right eval Left eval Rt 07/20/23 Rt 07/22/23 RT 07/27/23 Rt 07/29/23 08/03/23 Rt 08/06/23  Hip flexion          Hip extension          Hip abduction          Hip adduction          Hip internal rotation          Hip external rotation          Knee flexion 50  70 AA 80 AA75 AA85 AA 72 AA85  Knee extension 30  30 30 23  25d  30  Ankle dorsiflexion          Ankle plantarflexion          Ankle inversion          Ankle eversion           (Blank  rows = not tested) 08/11/23:30-85d. 08/19/23=AAROM 30-90d   LOWER EXTREMITY MMT:  MMT Right eval Left eval RT 07/27/23  Hip flexion 2  3 with limited ROM  Hip extension 2    Hip abduction 2    Hip adduction     Hip internal rotation     Hip external rotation 2    Knee flexion 2    Knee extension 2  3 with limited ROM  Ankle dorsiflexion     Ankle plantarflexion     Ankle inversion     Ankle eversion      (Blank rows = not tested)  LOWER EXTREMITY SPECIAL TESTS:  NT  FUNCTIONAL TESTS:  5 times sit to stand: TBA 2 minute walk test: TBA  GAIT: Distance walked: 49ft Assistive device utilized: Environmental consultant - 2 wheeled Level of assistance: SBA Comments: Antalgic over the R LE  TRANSFER: Mod A for mat table transfer c A for the R LE  TODAY'S TREATMENT:  [facility] Adult PT Treatment:                                                DATE: 08/23/23 Therapeutic Exercise: Nustep L3 x 7 minutes UE/LE LAQ 2x10 5# Seated knee ext 2 chair stretch c 5# weight Seated heel and toe raises SAQ QS into ball  H/s stretch with strap  SLR 2x10  Manual Therapy: A/P seated strap mobs, foot in chair Supine PROM knee ext  Patella mobs all planes    [facility] Adult PT Treatment:                                                DATE: 08/19/23 Therapeutic Exercise: Nustep L3 x 7 minutes UE/LE LAQ 2x10 3# Seated heal slides on disc x10 Seated  knee ext 2 chair stretch c 5# weight Seated heel and toe raises Seated march GTB 2x10 Seated GTB clam 2x10 Supine heel slide on disc x10; x5 c belt assist 30" SLR 2x10 STS x10 c use of hands Manual Therapy: A/P and P/A fem/tib grade 3 mobs Supine PROM knee ext  Gentle LAD distraction combined with R knee ext Self Care: For pt to focus 2 chair knee ext stretch c added weight at home  [facility] Adult PT Treatment:                                                DATE: 08/11/23 Therapeutic Exercise: Nustep L3 x 7 minutes UE/LE LAQ 2x10 3# Seated heal slides on disc x10 Seated knee ext on stool with pt over pressure Seated heel and toe raises Seated march GTB 2x10 Seated GTB clam 2x10 Supine heel slide on disc x10; x5 c belt assist 30" SLR 2x10 STS x5 c use of hands Manual Therapy: A/P seated tibia mobs Supine PROM knee flexion and ext  Gentle LAD distraction combined with R knee ext Self Care: Pt to complete cold packs at home per her preference  [facility] Adult PT Treatment:  DATE: 08/06/23 Therapeutic Exercise: Nustep L3 x 7 minutes UE/LE Supine heel slide on disc x10; x5 c belt assist 30" QS c slide disc x10 5" Seated LAQ x10 Seated heel and toe raises Seated march x10 Seated red band clam x10 Manual Therapy: A/P seated tibia mobs Supine PROM knee flexion and ext  Gentle LAD distraction combined with R knee ext  PATIENT EDUCATION:  Education details: Eval findings, POC, HEP, self care  Person educated: Patient Education method: Explanation, Demonstration, Tactile cues, Verbal cues, and Handouts Education comprehension: verbalized understanding, returned demonstration, verbal cues required, and tactile cues required  HOME EXERCISE PROGRAM: Access Code: [email or web address] URL: [email or web address] Date: 08/19/2023 Prepared by: Joellyn Rued  Exercises - Long Sitting Quad Set (Mirrored)  - 3 x daily - 7 x weekly - 1 sets -  10 reps - 5 hold - Supine Heel Slide with Strap  - 3 x daily - 7 x weekly - 1 sets - 10 reps - 10 hold - Supine Knee Extension Strengthening (Mirrored)  - 3 x daily - 7 x weekly - 1 sets - 10 reps - 3 hold - Supine Hip Abduction AROM (Mirrored)  - 3 x daily - 7 x weekly - 1 sets - 10 reps - 3 hold - Seated Knee Flexion Slide  - 3 x daily - 7 x weekly - 1 sets - 10 reps - 10 hold - Active Straight Leg Raise with Quad Set  - 3 x daily - 7 x weekly - 1 sets - 10 reps - Seated Knee Extension Stretch with Chair  - 3 x daily - 7 x weekly - 1 sets  ASSESSMENT:  CLINICAL IMPRESSION: Session focused on knee extension ROM and quad activation. Continues to be significantly limited into extension. Is performing 2 chair stretch, hasn't found comparable item to add additional weight to stretch. Used strap assist to complete A/P extension mobs in seated which she tolerated well. Will likely consider JAS at future session.   Eval: Patient is a 77 y.o. female who was seen today for physical therapy evaluation and treatment for M17.11 (ICD-10-CM) - Unilateral primary osteoarthritis, right knee . RIGHT TOTAL KNEE ARTHROPLASTY on 07/12/2023. Pt presents with markedly decreased R knee AROM and strength, and decreased functional mobility. Pt will benefit from skilled PT 2w8 to address impairments to optimize R LE/knee function with less pain.   OBJECTIVE IMPAIRMENTS: decreased activity tolerance, decreased mobility, difficulty walking, decreased ROM, decreased strength, increased edema, obesity, and pain.   ACTIVITY LIMITATIONS: carrying, lifting, bending, sitting, standing, squatting, sleeping, stairs, transfers, bed mobility, bathing, toileting, dressing, locomotion level, and caring for others  PARTICIPATION LIMITATIONS: meal prep, cleaning, laundry, and driving  PERSONAL FACTORS: Age, Fitness, Past/current experiences, Time since onset of injury/illness/exacerbation, and 1 comorbidity: high BMI  are also  affecting patient's functional outcome.   REHAB POTENTIAL: Good  CLINICAL DECISION MAKING: Stable/uncomplicated  EVALUATION COMPLEXITY: Low   GOALS:  SHORT TERM GOALS: Target date: 08/06/23 Pt will be Ind in an initial HEP  Baseline: started Goal status: MET  2.  Pt will be able to complete a SLR and SAQ with or without quad lag Baseline: not able to complete 07/29/23: able to complete both with limited knee ext ROM present Goal status: MET  3.  Increase R knee AROM to 15-80d for improved R knee function Baseline: 30-50 Goal status: Not met, ROM for flexion reached  LONG TERM GOALS: Target date: 09/17/23  Pt will be Ind in a final HEP to  maintain achieved LOF Baseline: started Goal status: INITIAL  2.  Increase R hip and knee strength to 4/5 for improved function use of the R LE Baseline: see flow sheets Goal status: INITIAL  3.  Increased R knee AROM to 5-115d for appropriate fuctional mobility Baseline: 30-50d Goal status: INITIAL  4.  Improve 5xSTS by MCID of 5" and by MCID of 34ft as indication of improved functional mobility  Baseline: TBA when pt has progressed to gait c a SPC Goal status: INITIAL  5.  Pt's FOTO score will improved to the predicted value of 43% as indication of improved function  Baseline: 22% Goal status: INITIAL  6.  Pt will be able to walk 416ft c or s a SPC and ascend/descend 12 steps c or s a SPC and c use of a HR for community mobility Baseline: 55ft c RW Goal status: INITIAL   PLAN:  PT FREQUENCY: 2x/week  PT DURATION: 8 weeks  PLANNED INTERVENTIONS: 97164- PT Re-evaluation, 97110-Therapeutic exercises, 97530- Therapeutic activity, 97535- Self Care, 43154- Manual therapy, [id number]- Gait training, 97014- Electrical stimulation (unattended), [id number]- Electrical stimulation (manual), 97016- Vasopneumatic device, Stair training, Dry Needling, Joint mobilization, Cryotherapy, and Moist heat  PLAN FOR NEXT SESSION: Review FOTO; assess  response to HEP; progress therex as indicated; use of modalities, manual therapy; and TPDN as indicated.   Jannette Spanner, PTA 08/23/23 3:37 PM Phone: [phone] Fax: [phone]

## 2023-08-23 NOTE — Progress Notes (Signed)
Pharmacy Medication Assistance Program Note    08/23/2023  Patient ID: Hannah [name], female   DOB: 1946/01/12, 77 y.o.   MRN: [id number]     08/16/2023 08/23/2023  Outreach Medication One  Initial Outreach Date (Medication One) 08/16/2023 08/16/2023  Manufacturer Medication One [employer]  [employer] Drugs Jardiance Jardiance  Dose of Jardiance 10 mg 10 mg  Type of Forensic scientist Assistance  Date Application Sent to Patient 08/16/2023 08/16/2023  Application Items Requested Application Application  Date Application Sent to Prescriber  08/23/2023  Name of Prescriber  Dorothyann Peng  Date Application Received From Patient  08/23/2023  Application Items Received From Patient  Application  Date Application Received From Provider  08/23/2023  Date Application Submitted to Manufacturer  08/23/2023  Method Application Sent to Manufacturer  Fax          08/16/2023 08/23/2023  Outreach Medication Two  Initial Outreach Date (Medication Two) 08/16/2023 08/16/2023  Manufacturer Medication Two Other Manufacturer/Drug Other Manufacturer/Drug  [facility] Other [facility] Other [facility]  Type of Assistance Manufacturer Assistance Manufacturer Assistance  Date Application Sent to Patient 08/16/2023 08/16/2023  Application Items Requested Application Application  Date Application Sent to Prescriber  08/23/2023  Name of Prescriber  Dorothyann Peng  Date Application Received From Patient  08/23/2023  Application Items Received From Patient  Application  Date Application Received From Provider  08/23/2023  Method Application Sent to Manufacturer  Fax  Date Application Submitted to Manufacturer  08/23/2023      Catie TClearance Coots, PharmD, BCACP, CPP Clinical Pharmacist [facility] [phone]

## 2023-08-23 NOTE — Addendum Note (Signed)
Addended by: Nilda Simmer T on: 08/23/2023 05:04 PM   Modules accepted: Orders

## 2023-08-24 NOTE — Therapy (Signed)
OUTPATIENT PHYSICAL THERAPY LOWER EXTREMITY TREATMENT/Progress Note   Patient Name: Hannah [name] MRN: [id number] DOB:[birth year], 77 y.o., female Today's Date: 08/25/2023  Progress Note Reporting Period 07/06/23 to 08/31/23  See note below for Objective Data and Assessment of Progress/Goals.      END OF SESSION:  PT End of Session - 08/25/23 1436     Visit Number 11    Number of Visits 17    Date for PT Re-Evaluation 09/17/23    Authorization Type [employer]    Authorization Time Period Approved 12 visits 07/19/23-09/04/23    Authorization - Visit Number 10    Authorization - Number of Visits 12    PT Start Time 1417    PT Stop Time 1500    PT Time Calculation (min) 43 min    Equipment Utilized During Treatment Other (comment)   rollator   Activity Tolerance Patient tolerated treatment well;Patient limited by pain    Behavior During Therapy WFL for tasks assessed/performed                     Past Medical History:  Diagnosis Date   Allergy 25 years ago.   Anemia    Arthritis    Asthma    Atrial fibrillation (HCC)    Cataract Had cataracts surgery 2015 & 2016   Chronic kidney disease    Diabetes mellitus without complication (HCC)    Gout 12/17/2014   patient reported   Heart murmur 4 years ago   Hyperlipidemia    Hypertension    Hypothyroidism    Systemic lupus erythematosus (HCC)    Vitiligo    Past Surgical History:  Procedure Laterality Date   CATARACT EXTRACTION Bilateral 2015   DILATION AND CURETTAGE OF UTERUS     DOPPLER ECHOCARDIOGRAPHY  05/2018   Internist to review with pt; potential heart murmur 06/20/18   EYE SURGERY  Cataract Surgery, 2015 & 2016.   keratosis removal  2021   KNEE CLOSED REDUCTION Left 07/06/2022   Procedure: CLOSED MANIPULATION KNEE;  Surgeon: Ollen Gross, MD;  Location: [facility];  Service: Orthopedics;  Laterality: Left;   REPLACEMENT TOTAL KNEE Left    SKIN SURGERY  11/30/2018   left side of face    TOOTH EXTRACTION     TOTAL KNEE ARTHROPLASTY Left 02/02/2022   Procedure: TOTAL KNEE ARTHROPLASTY;  Surgeon: Ollen Gross, MD;  Location: [facility];  Service: Orthopedics;  Laterality: Left;   TOTAL KNEE ARTHROPLASTY Right 07/12/2023   Procedure: RIGHT TOTAL KNEE ARTHROPLASTY;  Surgeon: Ollen Gross, MD;  Location: [facility];  Service: Orthopedics;  Laterality: Right;   Patient Active Problem List   Diagnosis Date Noted   Pure hypercholesterolemia 07/18/2023   Personal history of COVID-19 07/18/2023   Primary osteoarthritis of right knee 07/12/2023   Acquired thrombophilia (HCC) 01/12/2023   Arthrofibrosis of total knee arthroplasty (HCC) 07/06/2022   OA (osteoarthritis) of knee 02/02/2022   Primary osteoarthritis of left knee 02/02/2022   Chronic bilateral low back pain without sciatica 06/25/2021   Paresthesia and pain of extremity 06/25/2021   Atrial fibrillation (HCC) 04/16/2021   Demand ischemia (HCC) 03/26/2021   Unspecified atrial fibrillation (HCC) 03/26/2021   COVID-19 virus infection 03/26/2021   PVC (premature ventricular contraction) 05/06/2020   Type 2 diabetes mellitus with stage 3 chronic kidney disease, without long-term current use of insulin (HCC) 03/20/2020   Palpitations 03/20/2020   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   Primary hypothyroidism 03/20/2020  Vitiligo 03/20/2020   Class 2 severe obesity due to excess calories with serious comorbidity and body mass index (BMI) of 35.0 to 35.9 in adult (HCC) 10/31/2019   Iron deficiency anemia 08/12/2017   Osteoporosis 10/02/2016   Systemic lupus erythematosus (HCC) 10/02/2016   Rheumatoid arthritis involving multiple sites with positive rheumatoid factor (HCC) 10/02/2016   High risk medication use 10/02/2016   History of chronic kidney disease 10/02/2016   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/2016   Primary osteoarthritis of both knees 10/02/2016   Vitamin D deficiency  10/02/2016   History of diabetes mellitus 10/02/2016   History of hypertension 10/02/2016   History of asthma 10/02/2016   Absolute anemia    MGUS (monoclonal gammopathy of unknown significance)    Normocytic anemia 03/21/2015   Essential hypertension 03/20/2015   Abnormal CT of the chest 07/30/2012   Asthma 06/19/2012    PCP: Dorothyann Peng, MD  REFERRING PROVIDER: Ollen Gross, MD   REFERRING DIAG: M17.11 (ICD-10-CM) - Unilateral primary osteoarthritis, right knee   THERAPY DIAG:  Chronic pain of right knee  Muscle weakness (generalized)  Difficulty in walking, not elsewhere classified  Rationale for Evaluation and Treatment: Rehabilitation  ONSET DATE: 07/12/23  SUBJECTIVE:   SUBJECTIVE STATEMENT: Pt reports she is doing the 2 chair stretch at home.  EVAL: Pt reports she has been completing her HEP 1x daily and using cold packs on a frequent basis for pain and swelling management.  PAIN:  Are you having pain? Yes: NPRS scale: 5/10; pain range 5-10/10 Pain location: R knee Pain description: ache ,throb ,sharp Aggravating factors: movement, lifting, wt bearing Relieving factors: Pain medication, muscle relaxer, cold packs  PERTINENT HISTORY: High BMI, L LKA, Afib  PRECAUTIONS: Knee  RED FLAGS: None   WEIGHT BEARING RESTRICTIONS: No  FALLS:  Has patient fallen in last 6 months? No  LIVING ENVIRONMENT: Lives with: lives alone Lives in: House/apartment Stairs: No Has following equipment at home: Single point cane, Environmental consultant - 2 wheeled, Wheelchair (manual), and bed side commode  OCCUPATION: Retired  PLOF: Independent with household mobility with device  PATIENT GOALS: good use of my R knee  NEXT MD VISIT: 07/27/23  OBJECTIVE:  Note: Objective measures were completed at Evaluation unless otherwise noted.  DIAGNOSTIC FINDINGS: None for the R knee in Epic  PATIENT SURVEYS:  FOTO: Perceived function   22%, predicted   43%   COGNITION: Overall  cognitive status: Within functional limits for tasks assessed     SENSATION: WFL  EDEMA:  Significant R knee edema present   MUSCLE LENGTH: Hamstrings: Right NT deg; Left NT deg Thomas test: Right NT deg; Left NT deg  POSTURE: rounded shoulders, forward head, and anterior pelvic tilt  PALPATION: TTP to the R peri-knee  LOWER EXTREMITY ROM:  Active ROM Right eval Left eval Rt 07/20/23 Rt 07/22/23 RT 07/27/23 Rt 07/29/23 08/03/23 Rt 08/06/23  Hip flexion          Hip extension          Hip abduction          Hip adduction          Hip internal rotation          Hip external rotation          Knee flexion 50  70 AA 80 AA75 AA85 AA 72 AA85  Knee extension 30  30 30 23  25d  30  Ankle dorsiflexion  Ankle plantarflexion          Ankle inversion          Ankle eversion           (Blank rows = not tested) 08/11/23:30-85d. 08/19/23=AAROM 30-90d   LOWER EXTREMITY MMT:  MMT Right eval Left eval RT 07/27/23  Hip flexion 2  3 with limited ROM  Hip extension 2    Hip abduction 2    Hip adduction     Hip internal rotation     Hip external rotation 2    Knee flexion 2    Knee extension 2  3 with limited ROM  Ankle dorsiflexion     Ankle plantarflexion     Ankle inversion     Ankle eversion      (Blank rows = not tested)  LOWER EXTREMITY SPECIAL TESTS:  NT  FUNCTIONAL TESTS:  5 times sit to stand: TBA 2 minute walk test: TBA  GAIT: Distance walked: 51ft Assistive device utilized: Environmental consultant - 2 wheeled Level of assistance: SBA Comments: Antalgic over the R LE  TRANSFER: Mod A for mat table transfer c A for the R LE  TODAY'S TREATMENT:  [facility] Adult PT Treatment:                                                DATE: 08/25/23 Therapeutic Exercise: Nustep L3 x 6 minutes UE/LE LAQ 2x10 5# Seated knee ext 2 chair stretch c 5# weight SLR 2x10 Manual Therapy: Supine PROM knee ext  Patella mobs all planes  A/P and P/A fem/tib grade 3 mobs foot on  chair Gentle LAD distraction combined with R knee ext  [facility] Adult PT Treatment:                                                DATE: 08/23/23 Therapeutic Exercise: Nustep L3 x 7 minutes UE/LE LAQ 2x10 5# Seated knee ext 2 chair stretch c 5# weight Seated heel and toe raises SAQ QS into ball  H/s stretch with strap  SLR 2x10  Manual Therapy: A/P seated strap mobs, foot in chair Supine PROM knee ext  Patella mobs all planes    [facility] Adult PT Treatment:                                                DATE: 08/19/23 Therapeutic Exercise: Nustep L3 x 7 minutes UE/LE LAQ 2x10 3# Seated heal slides on disc x10 Seated knee ext 2 chair stretch c 5# weight Seated heel and toe raises Seated march GTB 2x10 Seated GTB clam 2x10 Supine heel slide on disc x10; x5 c belt assist 30" SLR 2x10 STS x10 c use of hands Manual Therapy: A/P and P/A fem/tib grade 3 mobs Supine PROM knee ext  Gentle LAD distraction combined with R knee ext Self Care: For pt to focus 2 chair knee ext stretch c added weight at home  [facility] Adult PT Treatment:  DATE: 08/11/23 Therapeutic Exercise: Nustep L3 x 7 minutes UE/LE LAQ 2x10 3# Seated heal slides on disc x10 Seated knee ext on stool with pt over pressure Seated heel and toe raises Seated march GTB 2x10 Seated GTB clam 2x10 Supine heel slide on disc x10; x5 c belt assist 30" SLR 2x10 STS x5 c use of hands Manual Therapy: A/P seated tibia mobs Supine PROM knee flexion and ext  Gentle LAD distraction combined with R knee ext Self Care: Pt to complete cold packs at home per her preference  PATIENT EDUCATION:  Education details: Eval findings, POC, HEP, self care  Person educated: Patient Education method: Explanation, Demonstration, Tactile cues, Verbal cues, and Handouts Education comprehension: verbalized understanding, returned demonstration, verbal cues required, and tactile cues required  HOME  EXERCISE PROGRAM: Access Code: [email or web address] URL: [email or web address] Date: 08/19/2023 Prepared by: Joellyn Rued  Exercises - Long Sitting Quad Set (Mirrored)  - 3 x daily - 7 x weekly - 1 sets - 10 reps - 5 hold - Supine Heel Slide with Strap  - 3 x daily - 7 x weekly - 1 sets - 10 reps - 10 hold - Supine Knee Extension Strengthening (Mirrored)  - 3 x daily - 7 x weekly - 1 sets - 10 reps - 3 hold - Supine Hip Abduction AROM (Mirrored)  - 3 x daily - 7 x weekly - 1 sets - 10 reps - 3 hold - Seated Knee Flexion Slide  - 3 x daily - 7 x weekly - 1 sets - 10 reps - 10 hold - Active Straight Leg Raise with Quad Set  - 3 x daily - 7 x weekly - 1 sets - 10 reps - Seated Knee Extension Stretch with Chair  - 3 x daily - 7 x weekly - 1 sets  ASSESSMENT:  CLINICAL IMPRESSION: Showed and discussed a JAS brace with pt. Pt expressed concern about her being able to don the brace by herself which is a valid concern. Continued PT focused on R knee ext ROM, but also for R knee/LE strengthening. Will contact Dr/Aluisio's office re: concern for the lack of progress the pt's R knee ext ROM has made. Despite pt not progressing to a [facility], will assess her 5xSTS and c the RW the next PT session. Pt will continue to benefit from skilled PT to address impairments for improved R knee function.   Eval: Patient is a 77 y.o. female who was seen today for physical therapy evaluation and treatment for M17.11 (ICD-10-CM) - Unilateral primary osteoarthritis, right knee . RIGHT TOTAL KNEE ARTHROPLASTY on 07/12/2023. Pt presents with markedly decreased R knee AROM and strength, and decreased functional mobility. Pt will benefit from skilled PT 2w8 to address impairments to optimize R LE/knee function with less pain.   OBJECTIVE IMPAIRMENTS: decreased activity tolerance, decreased mobility, difficulty walking, decreased ROM, decreased strength, increased edema, obesity, and pain.   ACTIVITY LIMITATIONS: carrying,  lifting, bending, sitting, standing, squatting, sleeping, stairs, transfers, bed mobility, bathing, toileting, dressing, locomotion level, and caring for others  PARTICIPATION LIMITATIONS: meal prep, cleaning, laundry, and driving  PERSONAL FACTORS: Age, Fitness, Past/current experiences, Time since onset of injury/illness/exacerbation, and 1 comorbidity: high BMI  are also affecting patient's functional outcome.   REHAB POTENTIAL: Good  CLINICAL DECISION MAKING: Stable/uncomplicated  EVALUATION COMPLEXITY: Low   GOALS:  SHORT TERM GOALS: Target date: 08/06/23 Pt will be Ind in an initial HEP  Baseline: started Goal status: MET  2.  Pt will be  able to complete a SLR and SAQ with or without quad lag Baseline: not able to complete 07/29/23: able to complete both with limited knee ext ROM present Goal status: MET  3.  Increase R knee AROM to 15-80d for improved R knee function Baseline: 30-50 Goal status: Not met, ROM for flexion reached  LONG TERM GOALS: Target date: 09/17/23  Pt will be Ind in a final HEP to maintain achieved LOF Baseline: started Goal status: INITIAL  2.  Increase R hip and knee strength to 4/5 for improved function use of the R LE Baseline: see flow sheets Goal status: INITIAL  3.  Increased R knee AROM to 5-115d for appropriate fuctional mobility Baseline: 30-50d Goal status: INITIAL  4.  Improve 5xSTS by MCID of 5" and by MCID of 32ft as indication of improved functional mobility  Baseline: TBA when pt has progressed to gait c a SPC Goal status: INITIAL  5.  Pt's FOTO score will improved to the predicted value of 43% as indication of improved function  Baseline: 22% Goal status: INITIAL  6.  Pt will be able to walk 456ft c or s a SPC and ascend/descend 12 steps c or s a SPC and c use of a HR for community mobility Baseline: 82ft c RW Goal status: INITIAL   PLAN:  PT FREQUENCY: 2x/week  PT DURATION: 8 weeks  PLANNED INTERVENTIONS:  97164- PT Re-evaluation, 97110-Therapeutic exercises, 97530- Therapeutic activity, 97535- Self Care, [id number]- Manual therapy, [id number]- Gait training, 97014- Electrical stimulation (unattended), [id number]- Electrical stimulation (manual), 97016- Vasopneumatic device, Stair training, Dry Needling, Joint mobilization, Cryotherapy, and Moist heat  PLAN FOR NEXT SESSION: Review FOTO; assess response to HEP; progress therex as indicated; use of modalities, manual therapy; and TPDN as indicated.   Osmara Drummonds MS, PT 08/27/23 12:59 PM   Joellyn Rued MS, PT 08/31/23 12:48 PM

## 2023-08-25 ENCOUNTER — Ambulatory Visit: Payer: Medicare HMO

## 2023-08-25 DIAGNOSIS — M6281 Muscle weakness (generalized): Secondary | ICD-10-CM | POA: Diagnosis not present

## 2023-08-25 DIAGNOSIS — G8929 Other chronic pain: Secondary | ICD-10-CM

## 2023-08-25 DIAGNOSIS — R262 Difficulty in walking, not elsewhere classified: Secondary | ICD-10-CM | POA: Diagnosis not present

## 2023-08-25 DIAGNOSIS — M25561 Pain in right knee: Secondary | ICD-10-CM | POA: Diagnosis not present

## 2023-08-30 ENCOUNTER — Telehealth: Payer: Self-pay | Admitting: Hematology

## 2023-08-30 NOTE — Therapy (Signed)
OUTPATIENT PHYSICAL THERAPY LOWER EXTREMITY TREATMENT   Patient Name: Hannah [name] MRN: [id number] DOB:August 12, 1946, 77 y.o., female Today's Date: 08/31/2023  END OF SESSION:  PT End of Session - 08/31/23 1423     Visit Number 12    Number of Visits 17    Date for PT Re-Evaluation 09/17/23    Authorization Type [employer]    Authorization Time Period Approved 12 visits 07/19/23-09/04/23    Authorization - Visit Number 11    Authorization - Number of Visits 12    PT Start Time 1417    PT Stop Time 1500    PT Time Calculation (min) 43 min    Activity Tolerance Patient tolerated treatment well;Patient limited by pain    Behavior During Therapy [facility] for tasks assessed/performed                      Past Medical History:  Diagnosis Date   Allergy 25 years ago.   Anemia    Arthritis    Asthma    Atrial fibrillation (HCC)    Cataract Had cataracts surgery 2015 & 2016   Chronic kidney disease    Diabetes mellitus without complication (HCC)    Gout 12/17/2014   patient reported   Heart murmur 4 years ago   Hyperlipidemia    Hypertension    Hypothyroidism    Systemic lupus erythematosus (HCC)    Vitiligo    Past Surgical History:  Procedure Laterality Date   CATARACT EXTRACTION Bilateral 2015   DILATION AND CURETTAGE OF UTERUS     DOPPLER ECHOCARDIOGRAPHY  05/2018   Internist to review with pt; potential heart murmur 06/20/18   EYE SURGERY  Cataract Surgery, 2015 & 2016.   keratosis removal  2021   KNEE CLOSED REDUCTION Left 07/06/2022   Procedure: CLOSED MANIPULATION KNEE;  Surgeon: Ollen Gross, MD;  Location: [facility];  Service: Orthopedics;  Laterality: Left;   REPLACEMENT TOTAL KNEE Left    SKIN SURGERY  11/30/2018   left side of face   TOOTH EXTRACTION     TOTAL KNEE ARTHROPLASTY Left 02/02/2022   Procedure: TOTAL KNEE ARTHROPLASTY;  Surgeon: Ollen Gross, MD;  Location: [facility];  Service: Orthopedics;  Laterality: Left;   TOTAL KNEE  ARTHROPLASTY Right 07/12/2023   Procedure: RIGHT TOTAL KNEE ARTHROPLASTY;  Surgeon: Ollen Gross, MD;  Location: [facility];  Service: Orthopedics;  Laterality: Right;   Patient Active Problem List   Diagnosis Date Noted   Pure hypercholesterolemia 07/18/2023   Personal history of COVID-19 07/18/2023   Primary osteoarthritis of right knee 07/12/2023   Acquired thrombophilia (HCC) 01/12/2023   Arthrofibrosis of total knee arthroplasty (HCC) 07/06/2022   OA (osteoarthritis) of knee 02/02/2022   Primary osteoarthritis of left knee 02/02/2022   Chronic bilateral low back pain without sciatica 06/25/2021   Paresthesia and pain of extremity 06/25/2021   Atrial fibrillation (HCC) 04/16/2021   Demand ischemia (HCC) 03/26/2021   Unspecified atrial fibrillation (HCC) 03/26/2021   COVID-19 virus infection 03/26/2021   PVC (premature ventricular contraction) 05/06/2020   Type 2 diabetes mellitus with stage 3 chronic kidney disease, without long-term current use of insulin (HCC) 03/20/2020   Palpitations 03/20/2020   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   Primary hypothyroidism 03/20/2020   Vitiligo 03/20/2020   Class 2 severe obesity due to excess calories with serious comorbidity and body mass index (BMI) of 35.0 to 35.9 in adult (HCC) 10/31/2019   Iron deficiency anemia 08/12/2017  Osteoporosis 10/02/2016   Systemic lupus erythematosus (HCC) 10/02/2016   Rheumatoid arthritis involving multiple sites with positive rheumatoid factor (HCC) 10/02/2016   High risk medication use 10/02/2016   History of chronic kidney disease 10/02/2016   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/2016   Primary osteoarthritis of both knees 10/02/2016   Vitamin D deficiency 10/02/2016   History of diabetes mellitus 10/02/2016   History of hypertension 10/02/2016   History of asthma 10/02/2016   Absolute anemia    MGUS (monoclonal gammopathy of unknown significance)    Normocytic  anemia 03/21/2015   Essential hypertension 03/20/2015   Abnormal CT of the chest 07/30/2012   Asthma 06/19/2012    PCP: Dorothyann Peng, MD  REFERRING PROVIDER: Ollen Gross, MD   REFERRING DIAG: M17.11 (ICD-10-CM) - Unilateral primary osteoarthritis, right knee   THERAPY DIAG:  Chronic pain of right knee  Muscle weakness (generalized)  Difficulty in walking, not elsewhere classified  Rationale for Evaluation and Treatment: Rehabilitation  ONSET DATE: 07/12/23  SUBJECTIVE:   SUBJECTIVE STATEMENT: Pt reports she turned to sit down not long before PT today and twisted her R knee, and it is bothering her more at this time.  EVAL: Pt reports she has been completing her HEP 1x daily and using cold packs on a frequent basis for pain and swelling management.  PAIN:  Are you having pain? Yes: NPRS scale: 5/10; pain range 5-10/10 Pain location: R knee Pain description: ache ,throb ,sharp Aggravating factors: movement, lifting, wt bearing Relieving factors: Pain medication, muscle relaxer, cold packs  PERTINENT HISTORY: High BMI, L LKA, Afib  PRECAUTIONS: Knee  RED FLAGS: None   WEIGHT BEARING RESTRICTIONS: No  FALLS:  Has patient fallen in last 6 months? No  LIVING ENVIRONMENT: Lives with: lives alone Lives in: House/apartment Stairs: No Has following equipment at home: Single point cane, Environmental consultant - 2 wheeled, Wheelchair (manual), and bed side commode  OCCUPATION: Retired  PLOF: Independent with household mobility with device  PATIENT GOALS: good use of my R knee  NEXT MD VISIT: 07/27/23  OBJECTIVE:  Note: Objective measures were completed at Evaluation unless otherwise noted.  DIAGNOSTIC FINDINGS: None for the R knee in Epic  PATIENT SURVEYS:  FOTO: Perceived function   22%, predicted   43%   COGNITION: Overall cognitive status: Within functional limits for tasks assessed     SENSATION: WFL  EDEMA:  Significant R knee edema present   MUSCLE  LENGTH: Hamstrings: Right NT deg; Left NT deg Thomas test: Right NT deg; Left NT deg  POSTURE: rounded shoulders, forward head, and anterior pelvic tilt  PALPATION: TTP to the R peri-knee  LOWER EXTREMITY ROM:  Active ROM Right eval Left eval Rt 07/20/23 Rt 07/22/23 RT 07/27/23 Rt 07/29/23 08/03/23 Rt 08/06/23  Hip flexion          Hip extension          Hip abduction          Hip adduction          Hip internal rotation          Hip external rotation          Knee flexion 50  70 AA 80 AA75 AA85 AA 72 AA85  Knee extension 30  30 30 23  25d  30  Ankle dorsiflexion          Ankle plantarflexion          Ankle inversion  Ankle eversion           (Blank rows = not tested) 08/11/23:30-85d. 08/19/23=AAROM 30-90d. 08/31/23=AA90d   LOWER EXTREMITY MMT:  MMT Right eval Left eval RT 07/27/23  Hip flexion 2  3 with limited ROM  Hip extension 2    Hip abduction 2    Hip adduction     Hip internal rotation     Hip external rotation 2    Knee flexion 2    Knee extension 2  3 with limited ROM  Ankle dorsiflexion     Ankle plantarflexion     Ankle inversion     Ankle eversion      (Blank rows = not tested)  LOWER EXTREMITY SPECIAL TESTS:  NT  FUNCTIONAL TESTS:  5 times sit to stand: TBA 2 minute walk test: TBA  5xSTS=24.7 c arm rest  2MWT=78' c RW GAIT: Distance walked: 5ft Assistive device utilized: Environmental consultant - 2 wheeled Level of assistance: SBA Comments: Antalgic over the R LE  TRANSFER: Mod A for mat table transfer c A for the R LE  TODAY'S TREATMENT:  [facility] Adult PT Treatment:                                                DATE: 08/31/23 Therapeutic Exercise: Nustep L3 x 6 minutes UE/LE LAQ 2x10 5# Seated knee flexion c gentle pt over pressure x5 20" 2 chair knee ext x10 10" SLR 2x10 H/L clams 2x10 GTB Manual Therapy: Supine PROM knee ext  Patella mobs all planes  A/P and P/A fem/tib grade 3 mobs foot on chair Therapeutic Activity: STS barichair  x10 5xSTS 24.7 " 33'  [facility] Adult PT Treatment:                                                 DATE: 08/25/23 Therapeutic Exercise: Nustep L3 x 6 minutes UE/LE LAQ 2x10 5# Seated knee ext 2 chair stretch c 5# weight SLR 2x10 Manual Therapy: Supine PROM knee ext  Patella mobs all planes  A/P and P/A fem/tib grade 3 mobs foot on chair Gentle LAD distraction combined with R knee ext  [facility] Adult PT Treatment:                                                DATE: 08/23/23 Therapeutic Exercise: Nustep L3 x 7 minutes UE/LE LAQ 2x10 5# Seated knee ext 2 chair stretch c 5# weight Seated heel and toe raises SAQ QS into ball  H/s stretch with strap  SLR 2x10  Manual Therapy: A/P seated strap mobs, foot in chair Supine PROM knee ext  Patella mobs all planes   PATIENT EDUCATION:  Education details: Eval findings, POC, HEP, self care  Person educated: Patient Education method: Explanation, Demonstration, Tactile cues, Verbal cues, and Handouts Education comprehension: verbalized understanding, returned demonstration, verbal cues required, and tactile cues required  HOME EXERCISE PROGRAM: Access Code: [email or web address] URL: [email or web address] Date: 08/19/2023 Prepared by: Joellyn Rued  Exercises - Long Sitting Quad Set (Mirrored)  - 3 x daily - 7 x weekly -  1 sets - 10 reps - 5 hold - Supine Heel Slide with Strap  - 3 x daily - 7 x weekly - 1 sets - 10 reps - 10 hold - Supine Knee Extension Strengthening (Mirrored)  - 3 x daily - 7 x weekly - 1 sets - 10 reps - 3 hold - Supine Hip Abduction AROM (Mirrored)  - 3 x daily - 7 x weekly - 1 sets - 10 reps - 3 hold - Seated Knee Flexion Slide  - 3 x daily - 7 x weekly - 1 sets - 10 reps - 10 hold - Active Straight Leg Raise with Quad Set  - 3 x daily - 7 x weekly - 1 sets - 10 reps - Seated Knee Extension Stretch with Chair  - 3 x daily - 7 x weekly - 1 sets  ASSESSMENT:  CLINICAL IMPRESSION: This PT called Dr. Deri Fuelling  office and left a message re: pt's lack of progress for R knee ext. PT was completed for R knee ROM and for R knee/LE strengthening. Assessed pt's 5xSTS and with both measures significantly less than the standard. R knee flexion remains at 90d. Will reassess pt the next PT session.  Eval: Patient is a 77 y.o. female who was seen today for physical therapy evaluation and treatment for M17.11 (ICD-10-CM) - Unilateral primary osteoarthritis, right knee . RIGHT TOTAL KNEE ARTHROPLASTY on 07/12/2023. Pt presents with markedly decreased R knee AROM and strength, and decreased functional mobility. Pt will benefit from skilled PT 2w8 to address impairments to optimize R LE/knee function with less pain.   OBJECTIVE IMPAIRMENTS: decreased activity tolerance, decreased mobility, difficulty walking, decreased ROM, decreased strength, increased edema, obesity, and pain.   ACTIVITY LIMITATIONS: carrying, lifting, bending, sitting, standing, squatting, sleeping, stairs, transfers, bed mobility, bathing, toileting, dressing, locomotion level, and caring for others  PARTICIPATION LIMITATIONS: meal prep, cleaning, laundry, and driving  PERSONAL FACTORS: Age, Fitness, Past/current experiences, Time since onset of injury/illness/exacerbation, and 1 comorbidity: high BMI  are also affecting patient's functional outcome.   REHAB POTENTIAL: Good  CLINICAL DECISION MAKING: Stable/uncomplicated  EVALUATION COMPLEXITY: Low   GOALS:  SHORT TERM GOALS: Target date: 08/06/23 Pt will be Ind in an initial HEP  Baseline: started Goal status: MET  2.  Pt will be able to complete a SLR and SAQ with or without quad lag Baseline: not able to complete 07/29/23: able to complete both with limited knee ext ROM present Goal status: MET  3.  Increase R knee AROM to 15-80d for improved R knee function Baseline: 30-50 Goal status: Not met, ROM for flexion reached  LONG TERM GOALS: Target date: 09/17/23  Pt will be Ind  in a final HEP to maintain achieved LOF Baseline: started Goal status: INITIAL  2.  Increase R hip and knee strength to 4/5 for improved function use of the R LE Baseline: see flow sheets Goal status: INITIAL  3.  Increased R knee AROM to 5-115d for appropriate fuctional mobility Baseline: 30-50d Goal status: INITIAL  4.  Improve 5xSTS by MCID of 5" and by MCID of 14ft as indication of improved functional mobility  Baseline: TBA when pt has progressed to gait c a [facility] 08/31/23: 5xSTS=24.7 c arm rest      2MWT=78' c RW Goal status: INITIAL  5.  Pt's FOTO score will improved to the predicted value of 43% as indication of improved function  Baseline: 22% Goal status: INITIAL  6.  Pt will be  able to walk 462ft c or s a SPC and ascend/descend 12 steps c or s a SPC and c use of a HR for community mobility Baseline: 51ft c RW Goal status: INITIAL   PLAN:  PT FREQUENCY: 2x/week  PT DURATION: 8 weeks  PLANNED INTERVENTIONS: 97164- PT Re-evaluation, 97110-Therapeutic exercises, 97530- Therapeutic activity, 97535- Self Care, 95284- Manual therapy, [id number]- Gait training, 97014- Electrical stimulation (unattended), [id number]- Electrical stimulation (manual), 97016- Vasopneumatic device, Stair training, Dry Needling, Joint mobilization, Cryotherapy, and Moist heat  PLAN FOR NEXT SESSION: Review FOTO; assess response to HEP; progress therex as indicated; use of modalities, manual therapy; and TPDN as indicated.   Rasheda Ledger MS, PT 08/31/23 5:22 PM

## 2023-08-30 NOTE — Telephone Encounter (Signed)
Patient is aware of scheduled appointment times/dates

## 2023-08-31 ENCOUNTER — Ambulatory Visit: Payer: Medicare HMO | Attending: Internal Medicine

## 2023-08-31 DIAGNOSIS — M25561 Pain in right knee: Secondary | ICD-10-CM | POA: Diagnosis not present

## 2023-08-31 DIAGNOSIS — G8929 Other chronic pain: Secondary | ICD-10-CM | POA: Diagnosis not present

## 2023-08-31 DIAGNOSIS — M6281 Muscle weakness (generalized): Secondary | ICD-10-CM | POA: Diagnosis not present

## 2023-08-31 DIAGNOSIS — R262 Difficulty in walking, not elsewhere classified: Secondary | ICD-10-CM | POA: Insufficient documentation

## 2023-09-01 NOTE — Therapy (Signed)
OUTPATIENT PHYSICAL THERAPY LOWER EXTREMITY TREATMENT/Re-Cert/Re-Auth   Patient Name: Hannah [name] MRN: [id number] DOB:10-26-1945, 77 y.o., female Today's Date: 09/02/2023  END OF SESSION:  PT End of Session - 09/02/23 1507     Visit Number 13    Number of Visits 17    Date for PT Re-Evaluation 10/22/23    Authorization Type [employer]    Authorization Time Period Approved 12 visits 07/19/23-09/04/23    Authorization - Visit Number 12    Authorization - Number of Visits 12    PT Start Time 1507    PT Stop Time 1545    PT Time Calculation (min) 38 min    Activity Tolerance Patient tolerated treatment well;Patient limited by pain    Behavior During Therapy [facility] for tasks assessed/performed                       Past Medical History:  Diagnosis Date   Allergy 25 years ago.   Anemia    Arthritis    Asthma    Atrial fibrillation (HCC)    Cataract Had cataracts surgery 2015 & 2016   Chronic kidney disease    Diabetes mellitus without complication (HCC)    Gout 12/17/2014   patient reported   Heart murmur 4 years ago   Hyperlipidemia    Hypertension    Hypothyroidism    Systemic lupus erythematosus (HCC)    Vitiligo    Past Surgical History:  Procedure Laterality Date   CATARACT EXTRACTION Bilateral 2015   DILATION AND CURETTAGE OF UTERUS     DOPPLER ECHOCARDIOGRAPHY  05/2018   Internist to review with pt; potential heart murmur 06/20/18   EYE SURGERY  Cataract Surgery, 2015 & 2016.   keratosis removal  2021   KNEE CLOSED REDUCTION Left 07/06/2022   Procedure: CLOSED MANIPULATION KNEE;  Surgeon: Ollen Gross, MD;  Location: [facility];  Service: Orthopedics;  Laterality: Left;   REPLACEMENT TOTAL KNEE Left    SKIN SURGERY  11/30/2018   left side of face   TOOTH EXTRACTION     TOTAL KNEE ARTHROPLASTY Left 02/02/2022   Procedure: TOTAL KNEE ARTHROPLASTY;  Surgeon: Ollen Gross, MD;  Location: [facility];  Service: Orthopedics;  Laterality:  Left;   TOTAL KNEE ARTHROPLASTY Right 07/12/2023   Procedure: RIGHT TOTAL KNEE ARTHROPLASTY;  Surgeon: Ollen Gross, MD;  Location: [facility];  Service: Orthopedics;  Laterality: Right;   Patient Active Problem List   Diagnosis Date Noted   Pure hypercholesterolemia 07/18/2023   Personal history of COVID-19 07/18/2023   Primary osteoarthritis of right knee 07/12/2023   Acquired thrombophilia (HCC) 01/12/2023   Arthrofibrosis of total knee arthroplasty (HCC) 07/06/2022   OA (osteoarthritis) of knee 02/02/2022   Primary osteoarthritis of left knee 02/02/2022   Chronic bilateral low back pain without sciatica 06/25/2021   Paresthesia and pain of extremity 06/25/2021   Atrial fibrillation (HCC) 04/16/2021   Demand ischemia (HCC) 03/26/2021   Unspecified atrial fibrillation (HCC) 03/26/2021   COVID-19 virus infection 03/26/2021   PVC (premature ventricular contraction) 05/06/2020   Type 2 diabetes mellitus with stage 3 chronic kidney disease, without long-term current use of insulin (HCC) 03/20/2020   Palpitations 03/20/2020   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   Primary hypothyroidism 03/20/2020   Vitiligo 03/20/2020   Class 2 severe obesity due to excess calories with serious comorbidity and body mass index (BMI) of 35.0 to 35.9 in adult [facility]) 10/31/2019   Iron deficiency anemia  08/12/2017   Osteoporosis 10/02/2016   Systemic lupus erythematosus (HCC) 10/02/2016   Rheumatoid arthritis involving multiple sites with positive rheumatoid factor (HCC) 10/02/2016   High risk medication use 10/02/2016   History of chronic kidney disease 10/02/2016   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/2016   Primary osteoarthritis of both knees 10/02/2016   Vitamin D deficiency 10/02/2016   History of diabetes mellitus 10/02/2016   History of hypertension 10/02/2016   History of asthma 10/02/2016   Absolute anemia    MGUS (monoclonal gammopathy of unknown  significance)    Normocytic anemia 03/21/2015   Essential hypertension 03/20/2015   Abnormal CT of the chest 07/30/2012   Asthma 06/19/2012    PCP: Dorothyann Peng, MD  REFERRING PROVIDER: Ollen Gross, MD   REFERRING DIAG: M17.11 (ICD-10-CM) - Unilateral primary osteoarthritis, right knee   THERAPY DIAG:  Chronic pain of right knee  Muscle weakness (generalized)  Difficulty in walking, not elsewhere classified  Rationale for Evaluation and Treatment: Rehabilitation  ONSET DATE: 07/12/23  SUBJECTIVE:   SUBJECTIVE STATEMENT: Pt reports her R knee pain has improved since the last PT session. Pt notes she did receive a call from Dr. Deri Fuelling office and her appt has been moved up to 12/11 from 12/17. Pt states she has been consistent with completing her HEP.  EVAL: Pt reports she has been completing her HEP 1x daily and using cold packs on a frequent basis for pain and swelling management.  PAIN:  Are you having pain? Yes: NPRS scale: 5/10; pain range 5-10/10 Pain location: R knee Pain description: ache ,throb ,sharp Aggravating factors: movement, lifting, wt bearing Relieving factors: Pain medication, muscle relaxer, cold packs  PERTINENT HISTORY: High BMI, L LKA, Afib  PRECAUTIONS: Knee  RED FLAGS: None   WEIGHT BEARING RESTRICTIONS: No  FALLS:  Has patient fallen in last 6 months? No  LIVING ENVIRONMENT: Lives with: lives alone Lives in: House/apartment Stairs: No Has following equipment at home: Single point cane, Environmental consultant - 2 wheeled, Wheelchair (manual), and bed side commode  OCCUPATION: Retired  PLOF: Independent with household mobility with device  PATIENT GOALS: good use of my R knee  NEXT MD VISIT: 07/27/23  OBJECTIVE:  Note: Objective measures were completed at Evaluation unless otherwise noted.  DIAGNOSTIC FINDINGS: None for the R knee in Epic  PATIENT SURVEYS:  FOTO: Perceived function   22%, predicted   43%   COGNITION: Overall  cognitive status: Within functional limits for tasks assessed     SENSATION: WFL  EDEMA:  Significant R knee edema present   MUSCLE LENGTH: Hamstrings: Right NT deg; Left NT deg Thomas test: Right NT deg; Left NT deg  POSTURE: rounded shoulders, forward head, and anterior pelvic tilt  PALPATION: TTP to the R peri-knee  LOWER EXTREMITY ROM:  Active ROM Right eval Left eval Rt 07/20/23 Rt 07/22/23 RT 07/27/23 Rt 07/29/23 08/03/23 Rt 08/06/23  Hip flexion          Hip extension          Hip abduction          Hip adduction          Hip internal rotation          Hip external rotation          Knee flexion 50  70 AA 80 AA75 AA85 AA 72 AA85  Knee extension 30  30 30 23  25d  30  Ankle dorsiflexion  Ankle plantarflexion          Ankle inversion          Ankle eversion           (Blank rows = not tested) 08/11/23:30-85d. 08/19/23=AAROM 30-90d. 08/31/23=AA90d   09/02/23=AA 30-85d   LOWER EXTREMITY MMT:  MMT Right eval Left eval RT 07/27/23 Rt 09/02/23  Hip flexion 2  3 with limited ROM 4+  Hip extension 2   4+  Hip abduction 2   4+  Hip adduction      Hip internal rotation      Hip external rotation 2   4+  Knee flexion 2   4+ with limited ROM  Knee extension 2  3 with limited ROM 4+ with limited ROM  Ankle dorsiflexion      Ankle plantarflexion      Ankle inversion      Ankle eversion       (Blank rows = not tested)  LOWER EXTREMITY SPECIAL TESTS:  NT  FUNCTIONAL TESTS:  5 times sit to stand: TBA 2 minute walk test: TBA  5xSTS=24.7 c arm rest  2MWT=78' c RW GAIT: Distance walked: 63ft Assistive device utilized: Environmental consultant - 2 wheeled Level of assistance: SBA Comments: Antalgic over the R LE  TRANSFER: Mod A for mat table transfer c A for the R LE  TODAY'S TREATMENT:  [facility] Adult PT Treatment:                                                DATE: 09/02/23 Therapeutic Exercise: Seated knee flexion/ext x10 on towel c 20" pt over pressure each  direction Supine knee ext on transfer board x10 c 20" over pressure by PT Supine knee flexion on transfer board x10 Supine knee flexion on transfer board x10 c pt over pressure with strap 20" SLR 2x10 5# S/L hip abd 2x10 3# SAQ 2x10 5# MMT  [facility] Adult PT Treatment:                                                DATE: 08/31/23 Therapeutic Exercise: Nustep L3 x 6 minutes UE/LE LAQ 2x10 5# Seated knee flexion c gentle pt over pressure x5 20" 2 chair knee ext x10 10" SLR 2x10 H/L clams 2x10 GTB Manual Therapy: Supine PROM knee ext  Patella mobs all planes  A/P and P/A fem/tib grade 3 mobs foot on chair Therapeutic Activity: STS barichair x10 5xSTS 24.7 " 14'  [facility] Adult PT Treatment:                                                 DATE: 08/25/23 Therapeutic Exercise: Nustep L3 x 6 minutes UE/LE LAQ 2x10 5# Seated knee ext 2 chair stretch c 5# weight SLR 2x10 Manual Therapy: Supine PROM knee ext  Patella mobs all planes  A/P and P/A fem/tib grade 3 mobs foot on chair Gentle LAD distraction combined with R knee ext  PATIENT EDUCATION:  Education details: Eval findings, POC, HEP, self care  Person educated: Patient Education method: Explanation, Demonstration, Tactile cues,  Verbal cues, and Handouts Education comprehension: verbalized understanding, returned demonstration, verbal cues required, and tactile cues required  HOME EXERCISE PROGRAM: Access Code: [email or web address] URL: [email or web address] Date: 08/19/2023 Prepared by: Joellyn Rued  Exercises - Long Sitting Quad Set (Mirrored)  - 3 x daily - 7 x weekly - 1 sets - 10 reps - 5 hold - Supine Heel Slide with Strap  - 3 x daily - 7 x weekly - 1 sets - 10 reps - 10 hold - Supine Knee Extension Strengthening (Mirrored)  - 3 x daily - 7 x weekly - 1 sets - 10 reps - 3 hold - Supine Hip Abduction AROM (Mirrored)  - 3 x daily - 7 x weekly - 1 sets - 10 reps - 3 hold - Seated Knee Flexion Slide  - 3 x daily - 7  x weekly - 1 sets - 10 reps - 10 hold - Active Straight Leg Raise with Quad Set  - 3 x daily - 7 x weekly - 1 sets - 10 reps - Seated Knee Extension Stretch with Chair  - 3 x daily - 7 x weekly - 1 sets  ASSESSMENT:  CLINICAL IMPRESSION: Pt's ortho MD appt has been moved up to 09/08/23 from 09/17/23. Pt's R knee LE strength was reassessed today demonstrating very good improvement, meeting goal #2. Pt's R knee AROM continues to be significantly limited at -30 to 85d. This limitation of R knee ext is impacting pt's ambulation quality re: pace and stability. Pt continues to need a RW at this time for support, but she is able to walk Indly with it. The course of care to address pt's R knee ROM will be better understood following her 09/08/23 ortho MD appt.  Pt will continue to benefit from skilled PT 2w6 to address R knee ROM deficits to optimize R knee/LE function and pt's mobility.   Eval: Patient is a 77 y.o. female who was seen today for physical therapy evaluation and treatment for M17.11 (ICD-10-CM) - Unilateral primary osteoarthritis, right knee . RIGHT TOTAL KNEE ARTHROPLASTY on 07/12/2023. Pt presents with markedly decreased R knee AROM and strength, and decreased functional mobility. Pt will benefit from skilled PT 2w8 to address impairments to optimize R LE/knee function with less pain.   OBJECTIVE IMPAIRMENTS: decreased activity tolerance, decreased mobility, difficulty walking, decreased ROM, decreased strength, increased edema, obesity, and pain.   ACTIVITY LIMITATIONS: carrying, lifting, bending, sitting, standing, squatting, sleeping, stairs, transfers, bed mobility, bathing, toileting, dressing, locomotion level, and caring for others  PARTICIPATION LIMITATIONS: meal prep, cleaning, laundry, and driving  PERSONAL FACTORS: Age, Fitness, Past/current experiences, Time since onset of injury/illness/exacerbation, and 1 comorbidity: high BMI  are also affecting patient's functional outcome.    REHAB POTENTIAL: Good  CLINICAL DECISION MAKING: Stable/uncomplicated  EVALUATION COMPLEXITY: Low   GOALS:  SHORT TERM GOALS: Target date: 08/06/23 Pt will be Ind in an initial HEP  Baseline: started Goal status: MET  2.  Pt will be able to complete a SLR and SAQ with or without quad lag Baseline: not able to complete 07/29/23: able to complete both with limited knee ext ROM present Goal status: MET  3.  Increase R knee AROM to 15-80d for improved R knee function Baseline: 30-50 Goal status: Not met, ROM for flexion reached  LONG TERM GOALS: Target date: 10/22/23  Pt will be Ind in a final HEP to maintain achieved LOF Baseline: started Goal status: ONGOING  2.  Increase R hip and knee strength to 4/5  for improved function use of the R LE Baseline: see flow sheets 09/02/23: see flow sheets Goal status: MET  3.  Increased R knee AROM to 5-115d for appropriate fuctional mobility Baseline: 30-50d 09/02/23: 30-85d Goal status: ONGOING  4.  Improve 5xSTS by MCID of 5" and by MCID of 43ft as indication of improved functional mobility  Baseline: TBA when pt has progressed to gait c a [facility] 08/31/23: 5xSTS=24.7 c arm rest      2MWT=78' c RW Goal status: ONGOING  5.  Pt's FOTO score will improved to the predicted value of 43% as indication of improved function  Baseline: 22% Goal status: ONGOING  6.  Pt will be able to walk 448ft c or s a SPC and ascend/descend 12 steps c or s a SPC and c use of a HR for community mobility Baseline: 77ft c RW Goal status: ONGOING   PLAN:  PT FREQUENCY: 2x/week  PT DURATION: 6 weeks  PLANNED INTERVENTIONS: 97164- PT Re-evaluation, 97110-Therapeutic exercises, 97530- Therapeutic activity, 97535- Self Care, 16109- Manual therapy, [id number]- Gait training, 97014- Electrical stimulation (unattended), [id number]- Electrical stimulation (manual), 97016- Vasopneumatic device, Stair training, Dry Needling, Joint mobilization, Cryotherapy, and Moist  heat  PLAN FOR NEXT SESSION: Review FOTO; assess response to HEP; progress therex as indicated; use of modalities, manual therapy; and TPDN as indicated.   Aime Meloche MS, PT 09/02/23 11:47 PM   Referring diagnosis? M17.11 (ICD-10-CM) - Unilateral primary osteoarthritis, right knee    Treatment diagnosis? (if different than referring diagnosis) Chronic pain of right knee, Muscle weakness (generalized), Difficulty in walking, not elsewhere classified   What was this (referring dx) caused by? [x]  Surgery []  Fall []  Ongoing issue []  Arthritis []  Other: ____________   Laterality: [x]  Rt []  Lt []  Both   Check all possible CPT codes:             *CHOOSE 10 OR LESS*                          [x]  97110 (Therapeutic Exercise)             []  92507 (SLP Treatment)  []  97112 (Neuro Re-ed)                           []  92526 (Swallowing Treatment)             [x]  97116 (Gait Training)                           []  [id number] (Cognitive Training, 1st 15 minutes) [x]  97140 (Manual Therapy)                                []  97130 (Cognitive Training, each add'l 15 minutes)   [x]  97164 (Re-evaluation)                              []  Other, List CPT Code ____________  [x]  97530 (Therapeutic Activities)                                    [x]  97535 (Self Care)                      []   All codes above (97110 - 97535)            []  97012 (Mechanical Traction)            [x]  97014 (E-stim Unattended)            [x]  16109 (E-stim manual)            []  97033 (Ionto)            []  97035 (Ultrasound) []  97750 (Physical Performance Training) []  [id number] (Aquatic Therapy) []  97016 (Vasopneumatic Device) []  [id number] (Paraffin) []  97034 (Contrast Bath) []  97597 (Wound Care 1st 20 sq cm) []  97598 (Wound Care each add'l 20 sq cm) []  97760 (Orthotic Fabrication, Fitting, Training Initial) []  [id number] (Prosthetic Management and Training Initial) []  [id number] (Orthotic or Prosthetic Training/ Modification Subsequent)

## 2023-09-02 ENCOUNTER — Ambulatory Visit: Payer: Medicare HMO

## 2023-09-02 DIAGNOSIS — M25561 Pain in right knee: Secondary | ICD-10-CM | POA: Diagnosis not present

## 2023-09-02 DIAGNOSIS — G8929 Other chronic pain: Secondary | ICD-10-CM | POA: Diagnosis not present

## 2023-09-02 DIAGNOSIS — M6281 Muscle weakness (generalized): Secondary | ICD-10-CM | POA: Diagnosis not present

## 2023-09-02 DIAGNOSIS — R262 Difficulty in walking, not elsewhere classified: Secondary | ICD-10-CM

## 2023-09-02 NOTE — Progress Notes (Deleted)
Office Visit Note  Patient: Hannah [name]             Date of Birth: 12-15-1945           MRN: [id number]             PCP: Dorothyann Peng, MD Referring: Dorothyann Peng, MD Visit Date: 09/16/2023 Occupation: @GUAROCC @  Subjective:  No chief complaint on file.   History of Present Illness: Hannah [name] is a 77 y.o. female ***     Activities of Daily Living:  Patient reports morning stiffness for *** {minute/hour:19697}.   Patient {ACTIONS;DENIES/REPORTS:21021675::"Denies"} nocturnal pain.  Difficulty dressing/grooming: {ACTIONS;DENIES/REPORTS:21021675::"Denies"} Difficulty climbing stairs: {ACTIONS;DENIES/REPORTS:21021675::"Denies"} Difficulty getting out of chair: {ACTIONS;DENIES/REPORTS:21021675::"Denies"} Difficulty using hands for taps, buttons, cutlery, and/or writing: {ACTIONS;DENIES/REPORTS:21021675::"Denies"}  No Rheumatology ROS completed.   PMFS History:  Patient Active Problem List   Diagnosis Date Noted   Pure hypercholesterolemia 07/18/2023   Personal history of COVID-19 07/18/2023   Primary osteoarthritis of right knee 07/12/2023   Acquired thrombophilia (HCC) 01/12/2023   Arthrofibrosis of total knee arthroplasty (HCC) 07/06/2022   OA (osteoarthritis) of knee 02/02/2022   Primary osteoarthritis of left knee 02/02/2022   Chronic bilateral low back pain without sciatica 06/25/2021   Paresthesia and pain of extremity 06/25/2021   Atrial fibrillation (HCC) 04/16/2021   Demand ischemia (HCC) 03/26/2021   Unspecified atrial fibrillation (HCC) 03/26/2021   COVID-19 virus infection 03/26/2021   PVC (premature ventricular contraction) 05/06/2020   Type 2 diabetes mellitus with stage 3 chronic kidney disease, without long-term current use of insulin (HCC) 03/20/2020   Palpitations 03/20/2020   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   Primary hypothyroidism 03/20/2020   Vitiligo 03/20/2020   Class 2 severe obesity due to excess calories with  serious comorbidity and body mass index (BMI) of 35.0 to 35.9 in adult (HCC) 10/31/2019   Iron deficiency anemia 08/12/2017   Osteoporosis 10/02/2016   Systemic lupus erythematosus (HCC) 10/02/2016   Rheumatoid arthritis involving multiple sites with positive rheumatoid factor (HCC) 10/02/2016   High risk medication use 10/02/2016   History of chronic kidney disease 10/02/2016   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/2016   Primary osteoarthritis of both knees 10/02/2016   Vitamin D deficiency 10/02/2016   History of diabetes mellitus 10/02/2016   History of hypertension 10/02/2016   History of asthma 10/02/2016   Absolute anemia    MGUS (monoclonal gammopathy of unknown significance)    Normocytic anemia 03/21/2015   Essential hypertension 03/20/2015   Abnormal CT of the chest 07/30/2012   Asthma 06/19/2012    Past Medical History:  Diagnosis Date   Allergy 25 years ago.   Anemia    Arthritis    Asthma    Atrial fibrillation (HCC)    Cataract Had cataracts surgery 2015 & 2016   Chronic kidney disease    Diabetes mellitus without complication (HCC)    Gout 12/17/2014   patient reported   Heart murmur 4 years ago   Hyperlipidemia    Hypertension    Hypothyroidism    Systemic lupus erythematosus (HCC)    Vitiligo     Family History  Problem Relation Age of Onset   Heart disease Father    Diabetes Father    Hypertension Mother    Obesity Mother    Varicose Veins Mother    Hypertension Sister    Hypertension Sister    Leukemia Sister    Cancer Sister    Obesity Sister  Past Surgical History:  Procedure Laterality Date   CATARACT EXTRACTION Bilateral 2015   DILATION AND CURETTAGE OF UTERUS     DOPPLER ECHOCARDIOGRAPHY  05/2018   Internist to review with pt; potential heart murmur 06/20/18   EYE SURGERY  Cataract Surgery, 2015 & 2016.   keratosis removal  2021   KNEE CLOSED REDUCTION Left 07/06/2022   Procedure: CLOSED MANIPULATION KNEE;   Surgeon: Ollen Gross, MD;  Location: [facility];  Service: Orthopedics;  Laterality: Left;   REPLACEMENT TOTAL KNEE Left    SKIN SURGERY  11/30/2018   left side of face   TOOTH EXTRACTION     TOTAL KNEE ARTHROPLASTY Left 02/02/2022   Procedure: TOTAL KNEE ARTHROPLASTY;  Surgeon: Ollen Gross, MD;  Location: [facility];  Service: Orthopedics;  Laterality: Left;   TOTAL KNEE ARTHROPLASTY Right 07/12/2023   Procedure: RIGHT TOTAL KNEE ARTHROPLASTY;  Surgeon: Ollen Gross, MD;  Location: [facility];  Service: Orthopedics;  Laterality: Right;   Social History   Social History Narrative   Not on file   Immunization History  Administered Date(s) Administered   19-influenza Whole 07/15/2012   DTaP 06/18/2019   Fluad Quad(high Dose 65+) 06/19/2020, 06/03/2021   H1N1 08/09/2008   Influenza Split 07/27/2008, 06/28/2014   Influenza, High Dose Seasonal PF 05/18/2019, 06/04/2022   Influenza,inj,Quad PF,6+ Mos 06/20/2018   MMR 06/20/2010   Moderna Covid-19 Fall Seasonal Vaccine 67yrs & older 07/27/2022   Moderna SARS-COV2 Booster Vaccination 01/01/2021   Moderna Sars-Covid-2 Vaccination 11/20/2019, 12/19/2019, 08/06/2020, 01/01/2021, 08/13/2021   Pfizer Covid-19 Vaccine Bivalent Booster 60yrs & up 07/27/2022   Pneumococcal Conjugate-13 05/04/2018   Pneumococcal Polysaccharide-23 05/18/2019   Pneumococcal-Unspecified 06/28/2014   RSV,unspecified 10/22/2022   Tdap 06/15/2019   Tetanus 07/22/2005   Zoster Recombinant(Shingrix) 02/19/2021, 04/24/2021     Objective: Vital Signs: There were no vitals taken for this visit.   Physical Exam   Musculoskeletal Exam: ***  CDAI Exam: CDAI Score: -- Patient Global: --; Provider Global: -- Swollen: --; Tender: -- Joint Exam 09/16/2023   No joint exam has been documented for this visit   There is currently no information documented on the homunculus. Go to the Rheumatology activity and complete the homunculus joint exam.  Investigation: No  additional findings.  Imaging: No results found.  Recent Labs: Lab Results  Component Value Date   WBC 8.7 07/13/2023   HGB 8.8 (L) 07/13/2023   PLT 152 07/13/2023   NA 134 (L) 07/13/2023   K 4.5 07/13/2023   CL 104 07/13/2023   CO2 23 07/13/2023   GLUCOSE 148 (H) 07/13/2023   BUN 23 07/13/2023   CREATININE 1.26 (H) 07/13/2023   BILITOT 0.4 05/26/2023   ALKPHOS 44 04/28/2023   AST 20 05/26/2023   ALT 16 05/26/2023   PROT 6.3 05/26/2023   ALBUMIN 3.9 04/28/2023   CALCIUM 7.7 (L) 07/13/2023   GFRAA 44 (L) 12/19/2020    Speciality Comments: PLQ eye exam: 12/24/2022 [facility] Follow up in 1 year  Procedures:  No procedures performed Allergies: Shellfish allergy   Assessment / Plan:     Visit Diagnoses: No diagnosis found.  Orders: No orders of the defined types were placed in this encounter.  No orders of the defined types were placed in this encounter.   Face-to-face time spent with patient was *** minutes. Greater than 50% of time was spent in counseling and coordination of care.  Follow-Up Instructions: No follow-ups on file.   Ellen Henri, CMA  Note - This record has been created using [employer].  Chart creation errors have been sought, but may not always  have been located. Such creation errors do not reflect on  the standard of medical care.

## 2023-09-06 NOTE — Therapy (Signed)
OUTPATIENT PHYSICAL THERAPY LOWER EXTREMITY TREATMENT/Re-Cert/Re-Auth   Patient Name: Hannah [name] MRN: [id number] DOB:1946/09/03, 77 y.o., female Today's Date: 09/06/2023  END OF SESSION:              Past Medical History:  Diagnosis Date   Allergy 25 years ago.   Anemia    Arthritis    Asthma    Atrial fibrillation (HCC)    Cataract Had cataracts surgery 2015 & 2016   Chronic kidney disease    Diabetes mellitus without complication (HCC)    Gout 12/17/2014   patient reported   Heart murmur 4 years ago   Hyperlipidemia    Hypertension    Hypothyroidism    Systemic lupus erythematosus (HCC)    Vitiligo    Past Surgical History:  Procedure Laterality Date   CATARACT EXTRACTION Bilateral 2015   DILATION AND CURETTAGE OF UTERUS     DOPPLER ECHOCARDIOGRAPHY  05/2018   Internist to review with pt; potential heart murmur 06/20/18   EYE SURGERY  Cataract Surgery, 2015 & 2016.   keratosis removal  2021   KNEE CLOSED REDUCTION Left 07/06/2022   Procedure: CLOSED MANIPULATION KNEE;  Surgeon: Ollen Gross, MD;  Location: [facility];  Service: Orthopedics;  Laterality: Left;   REPLACEMENT TOTAL KNEE Left    SKIN SURGERY  11/30/2018   left side of face   TOOTH EXTRACTION     TOTAL KNEE ARTHROPLASTY Left 02/02/2022   Procedure: TOTAL KNEE ARTHROPLASTY;  Surgeon: Ollen Gross, MD;  Location: [facility];  Service: Orthopedics;  Laterality: Left;   TOTAL KNEE ARTHROPLASTY Right 07/12/2023   Procedure: RIGHT TOTAL KNEE ARTHROPLASTY;  Surgeon: Ollen Gross, MD;  Location: [facility];  Service: Orthopedics;  Laterality: Right;   Patient Active Problem List   Diagnosis Date Noted   Pure hypercholesterolemia 07/18/2023   Personal history of COVID-19 07/18/2023   Primary osteoarthritis of right knee 07/12/2023   Acquired thrombophilia (HCC) 01/12/2023   Arthrofibrosis of total knee arthroplasty (HCC) 07/06/2022   OA (osteoarthritis) of knee 02/02/2022   Primary  osteoarthritis of left knee 02/02/2022   Chronic bilateral low back pain without sciatica 06/25/2021   Paresthesia and pain of extremity 06/25/2021   Atrial fibrillation (HCC) 04/16/2021   Demand ischemia (HCC) 03/26/2021   Unspecified atrial fibrillation (HCC) 03/26/2021   COVID-19 virus infection 03/26/2021   PVC (premature ventricular contraction) 05/06/2020   Type 2 diabetes mellitus with stage 3 chronic kidney disease, without long-term current use of insulin (HCC) 03/20/2020   Palpitations 03/20/2020   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   Primary hypothyroidism 03/20/2020   Vitiligo 03/20/2020   Class 2 severe obesity due to excess calories with serious comorbidity and body mass index (BMI) of 35.0 to 35.9 in adult (HCC) 10/31/2019   Iron deficiency anemia 08/12/2017   Osteoporosis 10/02/2016   Systemic lupus erythematosus (HCC) 10/02/2016   Rheumatoid arthritis involving multiple sites with positive rheumatoid factor (HCC) 10/02/2016   High risk medication use 10/02/2016   History of chronic kidney disease 10/02/2016   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/2016   Primary osteoarthritis of both knees 10/02/2016   Vitamin D deficiency 10/02/2016   History of diabetes mellitus 10/02/2016   History of hypertension 10/02/2016   History of asthma 10/02/2016   Absolute anemia    MGUS (monoclonal gammopathy of unknown significance)    Normocytic anemia 03/21/2015   Essential hypertension 03/20/2015   Abnormal CT of the chest 07/30/2012   Asthma  06/19/2012    PCP: Dorothyann Peng, MD  REFERRING PROVIDER: Ollen Gross, MD   REFERRING DIAG: M17.11 (ICD-10-CM) - Unilateral primary osteoarthritis, right knee   THERAPY DIAG:  No diagnosis found.  Rationale for Evaluation and Treatment: Rehabilitation  ONSET DATE: 07/12/23  SUBJECTIVE:   SUBJECTIVE STATEMENT: Pt reports her R knee pain has improved since the last PT session. Pt notes  she did receive a call from Dr. Deri Fuelling office and her appt has been moved up to 12/11 from 12/17. Pt states she has been consistent with completing her HEP.  EVAL: Pt reports she has been completing her HEP 1x daily and using cold packs on a frequent basis for pain and swelling management.  PAIN:  Are you having pain? Yes: NPRS scale: 5/10; pain range 5-10/10 Pain location: R knee Pain description: ache ,throb ,sharp Aggravating factors: movement, lifting, wt bearing Relieving factors: Pain medication, muscle relaxer, cold packs  PERTINENT HISTORY: High BMI, L LKA, Afib  PRECAUTIONS: Knee  RED FLAGS: None   WEIGHT BEARING RESTRICTIONS: No  FALLS:  Has patient fallen in last 6 months? No  LIVING ENVIRONMENT: Lives with: lives alone Lives in: House/apartment Stairs: No Has following equipment at home: Single point cane, Environmental consultant - 2 wheeled, Wheelchair (manual), and bed side commode  OCCUPATION: Retired  PLOF: Independent with household mobility with device  PATIENT GOALS: good use of my R knee  NEXT MD VISIT: 07/27/23  OBJECTIVE:  Note: Objective measures were completed at Evaluation unless otherwise noted.  DIAGNOSTIC FINDINGS: None for the R knee in Epic  PATIENT SURVEYS:  FOTO: Perceived function   22%, predicted   43%   COGNITION: Overall cognitive status: Within functional limits for tasks assessed     SENSATION: WFL  EDEMA:  Significant R knee edema present   MUSCLE LENGTH: Hamstrings: Right NT deg; Left NT deg Thomas test: Right NT deg; Left NT deg  POSTURE: rounded shoulders, forward head, and anterior pelvic tilt  PALPATION: TTP to the R peri-knee  LOWER EXTREMITY ROM:  Active ROM Right eval Left eval Rt 07/20/23 Rt 07/22/23 RT 07/27/23 Rt 07/29/23 08/03/23 Rt 08/06/23  Hip flexion          Hip extension          Hip abduction          Hip adduction          Hip internal rotation          Hip external rotation          Knee  flexion 50  70 AA 80 AA75 AA85 AA 72 AA85  Knee extension 30  30 30 23  25d  30  Ankle dorsiflexion          Ankle plantarflexion          Ankle inversion          Ankle eversion           (Blank rows = not tested) 08/11/23:30-85d. 08/19/23=AAROM 30-90d. 08/31/23=AA90d   09/02/23=AA 30-85d   LOWER EXTREMITY MMT:  MMT Right eval Left eval RT 07/27/23 Rt 09/02/23  Hip flexion 2  3 with limited ROM 4+  Hip extension 2   4+  Hip abduction 2   4+  Hip adduction      Hip internal rotation      Hip external rotation 2   4+  Knee flexion 2   4+ with limited ROM  Knee extension 2  3 with limited ROM  4+ with limited ROM  Ankle dorsiflexion      Ankle plantarflexion      Ankle inversion      Ankle eversion       (Blank rows = not tested)  LOWER EXTREMITY SPECIAL TESTS:  NT  FUNCTIONAL TESTS:  5 times sit to stand: TBA 2 minute walk test: TBA  5xSTS=24.7 c arm rest  2MWT=78' c RW GAIT: Distance walked: 15ft Assistive device utilized: Environmental consultant - 2 wheeled Level of assistance: SBA Comments: Antalgic over the R LE  TRANSFER: Mod A for mat table transfer c A for the R LE  TODAY'S TREATMENT:  [facility] Adult PT Treatment:                                                DATE: 09/07/23 Therapeutic Exercise: *** Manual Therapy: *** Neuromuscular re-ed: *** Therapeutic Activity: *** Modalities: *** Self Care: ***  [facility] Adult PT Treatment:                                                DATE: 09/02/23 Therapeutic Exercise: Seated knee flexion/ext x10 on towel c 20" pt over pressure each direction Supine knee ext on transfer board x10 c 20" over pressure by PT Supine knee flexion on transfer board x10 Supine knee flexion on transfer board x10 c pt over pressure with strap 20" SLR 2x10 5# S/L hip abd 2x10 3# SAQ 2x10 5# MMT  [facility] Adult PT Treatment:                                                DATE: 08/31/23 Therapeutic Exercise: Nustep L3 x 6 minutes UE/LE LAQ 2x10 5# Seated  knee flexion c gentle pt over pressure x5 20" 2 chair knee ext x10 10" SLR 2x10 H/L clams 2x10 GTB Manual Therapy: Supine PROM knee ext  Patella mobs all planes  A/P and P/A fem/tib grade 3 mobs foot on chair Therapeutic Activity: STS barichair x10 5xSTS 24.7 " 37'  [facility] Adult PT Treatment:                                                 DATE: 08/25/23 Therapeutic Exercise: Nustep L3 x 6 minutes UE/LE LAQ 2x10 5# Seated knee ext 2 chair stretch c 5# weight SLR 2x10 Manual Therapy: Supine PROM knee ext  Patella mobs all planes  A/P and P/A fem/tib grade 3 mobs foot on chair Gentle LAD distraction combined with R knee ext  PATIENT EDUCATION:  Education details: Eval findings, POC, HEP, self care  Person educated: Patient Education method: Explanation, Demonstration, Tactile cues, Verbal cues, and Handouts Education comprehension: verbalized understanding, returned demonstration, verbal cues required, and tactile cues required  HOME EXERCISE PROGRAM: Access Code: [email or web address] URL: [email or web address] Date: 08/19/2023 Prepared by: Joellyn Rued  Exercises - Long Sitting Quad Set (Mirrored)  - 3 x daily - 7 x weekly - 1 sets - 10 reps - 5  hold - Supine Heel Slide with Strap  - 3 x daily - 7 x weekly - 1 sets - 10 reps - 10 hold - Supine Knee Extension Strengthening (Mirrored)  - 3 x daily - 7 x weekly - 1 sets - 10 reps - 3 hold - Supine Hip Abduction AROM (Mirrored)  - 3 x daily - 7 x weekly - 1 sets - 10 reps - 3 hold - Seated Knee Flexion Slide  - 3 x daily - 7 x weekly - 1 sets - 10 reps - 10 hold - Active Straight Leg Raise with Quad Set  - 3 x daily - 7 x weekly - 1 sets - 10 reps - Seated Knee Extension Stretch with Chair  - 3 x daily - 7 x weekly - 1 sets  ASSESSMENT:  CLINICAL IMPRESSION: Pt's ortho MD appt has been moved up to 09/08/23 from 09/17/23. Pt's R knee LE strength was reassessed today demonstrating very good improvement, meeting goal #2.  Pt's R knee AROM continues to be significantly limited at -30 to 85d. This limitation of R knee ext is impacting pt's ambulation quality re: pace and stability. Pt continues to need a RW at this time for support, but she is able to walk Indly with it. The course of care to address pt's R knee ROM will be better understood following her 09/08/23 ortho MD appt.  Pt will continue to benefit from skilled PT 2w6 to address R knee ROM deficits to optimize R knee/LE function and pt's mobility.   Eval: Patient is a 77 y.o. female who was seen today for physical therapy evaluation and treatment for M17.11 (ICD-10-CM) - Unilateral primary osteoarthritis, right knee . RIGHT TOTAL KNEE ARTHROPLASTY on 07/12/2023. Pt presents with markedly decreased R knee AROM and strength, and decreased functional mobility. Pt will benefit from skilled PT 2w8 to address impairments to optimize R LE/knee function with less pain.   OBJECTIVE IMPAIRMENTS: decreased activity tolerance, decreased mobility, difficulty walking, decreased ROM, decreased strength, increased edema, obesity, and pain.   ACTIVITY LIMITATIONS: carrying, lifting, bending, sitting, standing, squatting, sleeping, stairs, transfers, bed mobility, bathing, toileting, dressing, locomotion level, and caring for others  PARTICIPATION LIMITATIONS: meal prep, cleaning, laundry, and driving  PERSONAL FACTORS: Age, Fitness, Past/current experiences, Time since onset of injury/illness/exacerbation, and 1 comorbidity: high BMI  are also affecting patient's functional outcome.   REHAB POTENTIAL: Good  CLINICAL DECISION MAKING: Stable/uncomplicated  EVALUATION COMPLEXITY: Low   GOALS:  SHORT TERM GOALS: Target date: 08/06/23 Pt will be Ind in an initial HEP  Baseline: started Goal status: MET  2.  Pt will be able to complete a SLR and SAQ with or without quad lag Baseline: not able to complete 07/29/23: able to complete both with limited knee ext ROM  present Goal status: MET  3.  Increase R knee AROM to 15-80d for improved R knee function Baseline: 30-50 Goal status: Not met, ROM for flexion reached  LONG TERM GOALS: Target date: 10/22/23  Pt will be Ind in a final HEP to maintain achieved LOF Baseline: started Goal status: ONGOING  2.  Increase R hip and knee strength to 4/5 for improved function use of the R LE Baseline: see flow sheets 09/02/23: see flow sheets Goal status: MET  3.  Increased R knee AROM to 5-115d for appropriate fuctional mobility Baseline: 30-50d 09/02/23: 30-85d Goal status: ONGOING  4.  Improve 5xSTS by MCID of 5" and by MCID of 53ft as indication of  improved functional mobility  Baseline: TBA when pt has progressed to gait c a [facility] 08/31/23: 5xSTS=24.7 c arm rest      2MWT=78' c RW Goal status: ONGOING  5.  Pt's FOTO score will improved to the predicted value of 43% as indication of improved function  Baseline: 22% Goal status: ONGOING  6.  Pt will be able to walk 426ft c or s a SPC and ascend/descend 12 steps c or s a SPC and c use of a HR for community mobility Baseline: 16ft c RW Goal status: ONGOING   PLAN:  PT FREQUENCY: 2x/week  PT DURATION: 6 weeks  PLANNED INTERVENTIONS: 97164- PT Re-evaluation, 97110-Therapeutic exercises, 97530- Therapeutic activity, 97535- Self Care, 34742- Manual therapy, [id number]- Gait training, 97014- Electrical stimulation (unattended), [id number]- Electrical stimulation (manual), 97016- Vasopneumatic device, Stair training, Dry Needling, Joint mobilization, Cryotherapy, and Moist heat  PLAN FOR NEXT SESSION: Review FOTO; assess response to HEP; progress therex as indicated; use of modalities, manual therapy; and TPDN as indicated.   Melania Kirks MS, PT 09/06/23 8:58 PM   Referring diagnosis? M17.11 (ICD-10-CM) - Unilateral primary osteoarthritis, right knee    Treatment diagnosis? (if different than referring diagnosis) Chronic pain of right knee, Muscle  weakness (generalized), Difficulty in walking, not elsewhere classified   What was this (referring dx) caused by? [x]  Surgery []  Fall []  Ongoing issue []  Arthritis []  Other: ____________   Laterality: [x]  Rt []  Lt []  Both   Check all possible CPT codes:             *CHOOSE 10 OR LESS*                          [x]  97110 (Therapeutic Exercise)             []  59563 (SLP Treatment)  []  97112 (Neuro Re-ed)                           []  92526 (Swallowing Treatment)             [x]  97116 (Gait Training)                           []  [id number] (Cognitive Training, 1st 15 minutes) [x]  97140 (Manual Therapy)                                []  97130 (Cognitive Training, each add'l 15 minutes)   [x]  97164 (Re-evaluation)                              []  Other, List CPT Code ____________  [x]  97530 (Therapeutic Activities)                                    [x]  97535 (Self Care)                      []  All codes above (97110 - 97535)            []  97012 (Mechanical Traction)            [x]  97014 (E-stim Unattended)            [x]  97032 (E-stim  manual)            []  97033 (Ionto)            []  [phone] (Ultrasound) []  97750 (Physical Performance Training) []  [id number] (Aquatic Therapy) []  41324 (Vasopneumatic Device) []  [id number] (Paraffin) []  97034 (Contrast Bath) []  97597 (Wound Care 1st 20 sq cm) []  97598 (Wound Care each add'l 20 sq cm) []  97760 (Orthotic Fabrication, Fitting, Training Initial) []  [id number] (Prosthetic Management and Training Initial) []  [id number] (Orthotic or Prosthetic Training/ Modification Subsequent)

## 2023-09-07 ENCOUNTER — Ambulatory Visit: Payer: Medicare HMO

## 2023-09-07 DIAGNOSIS — G8929 Other chronic pain: Secondary | ICD-10-CM

## 2023-09-07 DIAGNOSIS — M25561 Pain in right knee: Secondary | ICD-10-CM | POA: Diagnosis not present

## 2023-09-07 DIAGNOSIS — M6281 Muscle weakness (generalized): Secondary | ICD-10-CM | POA: Diagnosis not present

## 2023-09-07 DIAGNOSIS — R262 Difficulty in walking, not elsewhere classified: Secondary | ICD-10-CM

## 2023-09-08 ENCOUNTER — Ambulatory Visit: Payer: Medicare HMO | Admitting: Rheumatology

## 2023-09-09 ENCOUNTER — Encounter (HOSPITAL_COMMUNITY)
Admission: RE | Admit: 2023-09-09 | Discharge: 2023-09-09 | Disposition: A | Discharge status: Home / Self Care | Payer: Medicare HMO | Source: Ambulatory Visit | Attending: Orthopedic Surgery | Admitting: Orthopedic Surgery

## 2023-09-09 ENCOUNTER — Other Ambulatory Visit: Payer: Self-pay

## 2023-09-09 ENCOUNTER — Encounter (HOSPITAL_COMMUNITY): Payer: Self-pay

## 2023-09-09 DIAGNOSIS — N1831 Chronic kidney disease, stage 3a: Secondary | ICD-10-CM

## 2023-09-09 NOTE — Patient Instructions (Addendum)
SURGICAL WAITING ROOM VISITATION  Patients having surgery or a procedure may have no more than 2 support people in the waiting area - these visitors may rotate.    Children under the age of 25 must have an adult with them who is not the patient.  If the patient needs to stay at the hospital during part of their recovery, the visitor guidelines for inpatient rooms apply. Pre-op nurse will coordinate an appropriate time for 1 support person to accompany patient in pre-op.  This support person may not rotate.    Please refer to the [facility] website for the visitor guidelines for Inpatients (after your surgery is over and you are in a regular room).       Your procedure is scheduled on: 09-13-23    Report to [facility] Main Entrance    Report to admitting at      3:35 PM   Call this number if you have problems the morning of surgery [phone]   Do not eat food :After Midnight.   After Midnight you may have the following liquids until __2:50___ PM DAY OF SURGERY  then nothing by mouth  Water Non-Citrus Juices (without pulp, NO RED-Apple, White grape, White cranberry) Black Coffee (NO MILK/CREAM OR CREAMERS, sugar ok)  Clear Tea (NO MILK/CREAM OR CREAMERS, sugar ok) regular and decaf                             Plain Jell-O (NO RED)                                           Fruit ices (not with fruit pulp, NO RED)                                     Popsicles (NO RED)                                                               Sports drinks like Gatorade (NO RED)                             If you have questions, please contact your surgeon's office.   FOLLOW ANY ADDITIONAL PRE OP INSTRUCTIONS YOU RECEIVED FROM YOUR SURGEON'S OFFICE!!!     Oral Hygiene is also important to reduce your risk of infection.                                    Remember - BRUSH YOUR TEETH THE MORNING OF SURGERY WITH YOUR REGULAR TOOTHPASTE  DENTURES WILL BE REMOVED PRIOR TO SURGERY  PLEASE DO NOT APPLY "Poly grip" OR ADHESIVES!!!   Do NOT smoke after Midnight   Stop all vitamins and herbal supplements 7 days before surgery.   Take these medicines the morning of surgery with A SIP OF WATER: synthroid, metoprolol,hydroxychloroquine, inhaler and bring rescue inhaler,  allopurinol, bring rescue inhaler   HOLD JARDIANCE  3 days before surgery  Last dose   DO NOT TAKE ANY ORAL DIABETIC MEDICATIONS DAY OF YOUR SURGERY  Bring CPAP mask and tubing day of surgery.                              You may not have any metal on your body including hair pins, jewelry, and body piercing             Do not wear make-up, lotions, powders, perfumes/cologne, or deodorant  Do not wear nail polish including gel and S&S, artificial/acrylic nails, or any other type of covering on natural nails including finger and toenails. If you have artificial nails, gel coating, etc. that needs to be removed by a nail salon please have this removed prior to surgery or surgery may need to be canceled/ delayed if the surgeon/ anesthesia feels like they are unable to be safely monitored.   Do not shave  48 hours prior to surgery.            Do not bring valuables to the hospital. [facility] IS NOT             RESPONSIBLE   FOR VALUABLES.   Contacts, glasses, dentures or bridgework may not be worn into surgery.   Bring small overnight bag day of surgery.   DO NOT BRING YOUR HOME MEDICATIONS TO THE HOSPITAL. PHARMACY WILL DISPENSE MEDICATIONS LISTED ON YOUR MEDICATION LIST TO YOU DURING YOUR ADMISSION IN THE HOSPITAL!    Patients discharged on the day of surgery will not be allowed to drive home.  Someone NEEDS to stay with you for the first 24 hours after anesthesia.   Special Instructions: Bring a copy of your healthcare power of attorney and living will documents the day of surgery if you haven't scanned them before.              Please read over the following fact sheets you were given: IF YOU  HAVE QUESTIONS ABOUT YOUR PRE-OP INSTRUCTIONS PLEASE CALL [phone]    If you test positive for Covid or have been in contact with anyone that has tested positive in the last 10 days please notify you surgeon.    [facility] - Preparing for Surgery Before surgery, you can play an important role.  Because skin is not sterile, your skin needs to be as free of germs as possible.  You can reduce the number of germs on your skin by washing with CHG (chlorahexidine gluconate) soap before surgery.  CHG is an antiseptic cleaner which kills germs and bonds with the skin to continue killing germs even after washing. Please DO NOT use if you have an allergy to CHG or antibacterial soaps.  If your skin becomes reddened/irritated stop using the CHG and inform your nurse when you arrive at Short Stay. Do not shave (including legs and underarms) for at least 48 hours prior to the first CHG shower.  You may shave your face/neck. Please follow these instructions carefully:  1.  Shower with CHG Soap the night before surgery and the  morning of Surgery.  2.  If you choose to wash your hair, wash your hair first as usual with your  normal  shampoo.  3.  After you shampoo, rinse your hair and body thoroughly to remove the  shampoo.  4.  Use CHG as you would any other liquid soap.  You can apply chg directly  to the skin and wash                       Gently with a scrungie or clean washcloth.  5.  Apply the CHG Soap to your body ONLY FROM THE NECK DOWN.   Do not use on face/ open                           Wound or open sores. Avoid contact with eyes, ears mouth and genitals (private parts).                       Wash face,  Genitals (private parts) with your normal soap.             6.  Wash thoroughly, paying special attention to the area where your surgery  will be performed.  7.  Thoroughly rinse your body with warm water from the neck down.  8.  DO NOT shower/wash with your normal soap  after using and rinsing off  the CHG Soap.                9.  Pat yourself dry with a clean towel.            10.  Wear clean pajamas.            11.  Place clean sheets on your bed the night of your first shower and do not  sleep with pets. Day of Surgery : Do not apply any lotions/deodorants the morning of surgery.  Please wear clean clothes to the hospital/surgery center.  FAILURE TO FOLLOW THESE INSTRUCTIONS MAY RESULT IN THE CANCELLATION OF YOUR SURGERY PATIENT SIGNATURE_________________________________  NURSE SIGNATURE__________________________________  ________________________________________________________________________  Hannah [name]  An incentive spirometer is a tool that can help keep your lungs clear and active. This tool measures how well you are filling your lungs with each breath. Taking long deep breaths may help reverse or decrease the chance of developing breathing (pulmonary) problems (especially infection) following: A long period of time when you are unable to move or be active. BEFORE THE PROCEDURE  If the spirometer includes an indicator to show your best effort, your nurse or respiratory therapist will set it to a desired goal. If possible, sit up straight or lean slightly forward. Try not to slouch. Hold the incentive spirometer in an upright position. INSTRUCTIONS FOR USE  Sit on the edge of your bed if possible, or sit up as far as you can in bed or on a chair. Hold the incentive spirometer in an upright position. Breathe out normally. Place the mouthpiece in your mouth and seal your lips tightly around it. Breathe in slowly and as deeply as possible, raising the piston or the ball toward the top of the column. Hold your breath for 3-5 seconds or for as long as possible. Allow the piston or ball to fall to the bottom of the column. Remove the mouthpiece from your mouth and breathe out normally. Rest for a few seconds and repeat Steps 1 through 7 at  least 10 times every 1-2 hours when you are awake. Take your time and take a few normal breaths between deep breaths. The spirometer may include an indicator to show your best effort. Use the indicator as a goal to work toward during  each repetition. After each set of 10 deep breaths, practice coughing to be sure your lungs are clear. If you have an incision (the cut made at the time of surgery), support your incision when coughing by placing a pillow or rolled up towels firmly against it. Once you are able to get out of bed, walk around indoors and cough well. You may stop using the incentive spirometer when instructed by your caregiver.  RISKS AND COMPLICATIONS Take your time so you do not get dizzy or light-headed. If you are in pain, you may need to take or ask for pain medication before doing incentive spirometry. It is harder to take a deep breath if you are having pain. AFTER USE Rest and breathe slowly and easily. It can be helpful to keep track of a log of your progress. Your caregiver can provide you with a simple table to help with this. If you are using the spirometer at home, follow these instructions: SEEK MEDICAL CARE IF:  You are having difficultly using the spirometer. You have trouble using the spirometer as often as instructed. Your pain medication is not giving enough relief while using the spirometer. You develop fever of 100.5 F (38.1 C) or higher. SEEK IMMEDIATE MEDICAL CARE IF:  You cough up bloody sputum that had not been present before. You develop fever of 102 F (38.9 C) or greater. You develop worsening pain at or near the incision site. MAKE SURE YOU:  Understand these instructions. Will watch your condition. Will get help right away if you are not doing well or get worse. Document Released: 01/25/2007 Document Revised: 12/07/2011 Document Reviewed: 03/28/2007 [facility] Patient Information 2014 [place],  [place].   ________________________________________________________________________

## 2023-09-09 NOTE — Progress Notes (Addendum)
PCP - Dorothyann Peng, MD Cardiologist - LOV 9-30 24 epic Laqueta Carina, MD  clearance for previous knee surgery  PPM/ICD -  Device Orders -  Rep Notified -   Chest x-ray - 2022 EKG - 06-28-23 epic Stress Test -  ECHO - 03-27-2021 epic Cardiac Cath -  HgbA1c- 07-05-23 epic   5.4  Sleep Study -  CPAP -  JARDIANCE  hold 3 days last dose 09-09-23  Fasting Blood Sugar - 88 Checks Blood Sugar __1___ times a day  Blood Thinner Instructions:Eliquis   instructed to call Surgeon office for instructions on holding Eliquis. Pt. Called back and surgeons office told her she did not have to stop her Eliquis for this procedure Aspirin Instructions:  ERAS Protcol - PRE-SURGERY G2-    COVID vaccine -yes   Activity-- Able to complete ADL's with no CP or SOB  Anesthesia review: HTN,A-FIB, Asthma, DM, Murmur,Lupus  , recent TKA 07-12-23  Patient denies shortness of breath, fever, cough and chest pain at PAT appointment   All instructions explained to the patient, with a verbal understanding of the material. Patient agrees to go over the instructions while at home for a better understanding. Patient also instructed to self quarantine after being tested for COVID-19. The opportunity to ask questions was provided.

## 2023-09-13 ENCOUNTER — Ambulatory Visit (HOSPITAL_BASED_OUTPATIENT_CLINIC_OR_DEPARTMENT_OTHER): Payer: Medicare HMO

## 2023-09-13 ENCOUNTER — Ambulatory Visit (HOSPITAL_COMMUNITY)
Admission: RE | Admit: 2023-09-13 | Discharge: 2023-09-13 | Disposition: A | Discharge status: Home / Self Care | Payer: Medicare HMO | Attending: Orthopedic Surgery | Admitting: Orthopedic Surgery

## 2023-09-13 ENCOUNTER — Ambulatory Visit (HOSPITAL_COMMUNITY): Payer: Medicare HMO | Admitting: Physician Assistant

## 2023-09-13 ENCOUNTER — Other Ambulatory Visit (HOSPITAL_COMMUNITY): Payer: Self-pay

## 2023-09-13 ENCOUNTER — Encounter (HOSPITAL_COMMUNITY): Payer: Self-pay | Admitting: Orthopedic Surgery

## 2023-09-13 ENCOUNTER — Encounter (HOSPITAL_COMMUNITY)
Admission: RE | Disposition: A | Discharge status: Home / Self Care | Payer: Self-pay | Source: Home / Self Care | Attending: Orthopedic Surgery

## 2023-09-13 DIAGNOSIS — Z96653 Presence of artificial knee joint, bilateral: Secondary | ICD-10-CM | POA: Insufficient documentation

## 2023-09-13 DIAGNOSIS — I10 Essential (primary) hypertension: Secondary | ICD-10-CM | POA: Diagnosis not present

## 2023-09-13 DIAGNOSIS — E119 Type 2 diabetes mellitus without complications: Secondary | ICD-10-CM | POA: Insufficient documentation

## 2023-09-13 DIAGNOSIS — J45909 Unspecified asthma, uncomplicated: Secondary | ICD-10-CM | POA: Insufficient documentation

## 2023-09-13 DIAGNOSIS — I4891 Unspecified atrial fibrillation: Secondary | ICD-10-CM | POA: Insufficient documentation

## 2023-09-13 DIAGNOSIS — E1122 Type 2 diabetes mellitus with diabetic chronic kidney disease: Secondary | ICD-10-CM

## 2023-09-13 DIAGNOSIS — E039 Hypothyroidism, unspecified: Secondary | ICD-10-CM | POA: Insufficient documentation

## 2023-09-13 DIAGNOSIS — Z96651 Presence of right artificial knee joint: Secondary | ICD-10-CM | POA: Diagnosis not present

## 2023-09-13 DIAGNOSIS — T8482XD Fibrosis due to internal orthopedic prosthetic devices, implants and grafts, subsequent encounter: Secondary | ICD-10-CM

## 2023-09-13 DIAGNOSIS — M24661 Ankylosis, right knee: Secondary | ICD-10-CM | POA: Diagnosis present

## 2023-09-13 DIAGNOSIS — Z7984 Long term (current) use of oral hypoglycemic drugs: Secondary | ICD-10-CM | POA: Diagnosis not present

## 2023-09-13 DIAGNOSIS — Z79899 Other long term (current) drug therapy: Secondary | ICD-10-CM | POA: Diagnosis not present

## 2023-09-13 DIAGNOSIS — M1711 Unilateral primary osteoarthritis, right knee: Secondary | ICD-10-CM | POA: Diagnosis present

## 2023-09-13 HISTORY — PX: KNEE CLOSED REDUCTION: SHX995

## 2023-09-13 LAB — CBC
HCT: 30.9 % — ABNORMAL LOW (ref 36.0–46.0)
Hemoglobin: 9.4 g/dL — ABNORMAL LOW (ref 12.0–15.0)
MCH: 30.9 pg (ref 26.0–34.0)
MCHC: 30.4 g/dL (ref 30.0–36.0)
MCV: 101.6 fL — ABNORMAL HIGH (ref 80.0–100.0)
Platelets: 245 10*3/uL (ref 150–400)
RBC: 3.04 MIL/uL — ABNORMAL LOW (ref 3.87–5.11)
RDW: 14.1 % (ref 11.5–15.5)
WBC: 6.1 10*3/uL (ref 4.0–10.5)
nRBC: 0 % (ref 0.0–0.2)

## 2023-09-13 LAB — BASIC METABOLIC PANEL
Anion gap: 9 (ref 5–15)
BUN: 15 mg/dL (ref 8–23)
CO2: 25 mmol/L (ref 22–32)
Calcium: 8.8 mg/dL — ABNORMAL LOW (ref 8.9–10.3)
Chloride: 104 mmol/L (ref 98–111)
Creatinine, Ser: 1.06 mg/dL — ABNORMAL HIGH (ref 0.44–1.00)
GFR, Estimated: 54 mL/min — ABNORMAL LOW (ref >60)
Glucose, Bld: 95 mg/dL (ref 70–99)
Potassium: 4 mmol/L (ref 3.5–5.1)
Sodium: 138 mmol/L (ref 135–145)

## 2023-09-13 LAB — HEMOGLOBIN A1C
Hgb A1c MFr Bld: 4.8 % (ref 4.8–5.6)
Mean Plasma Glucose: 91.06 mg/dL

## 2023-09-13 LAB — GLUCOSE, CAPILLARY: Glucose-Capillary: 75 mg/dL (ref 70–99)

## 2023-09-13 SURGERY — MANIPULATION, KNEE, CLOSED
Anesthesia: General | Site: Knee | Laterality: Right

## 2023-09-13 MED ORDER — PROPOFOL 10 MG/ML IV BOLUS
INTRAVENOUS | Status: DC | PRN
  Administered 2023-09-13: 120 mg via INTRAVENOUS
  Administered 2023-09-13 (×2): 40 mg via INTRAVENOUS

## 2023-09-13 MED ORDER — OXYCODONE HCL 5 MG PO TABS
5.0000 mg | ORAL_TABLET | Freq: Once | ORAL | Status: AC
  Administered 2023-09-13: 5 mg via ORAL

## 2023-09-13 MED ORDER — KETOROLAC TROMETHAMINE 15 MG/ML IJ SOLN
15.0000 mg | Freq: Once | INTRAMUSCULAR | Status: AC | PRN
  Administered 2023-09-13: 15 mg via INTRAVENOUS

## 2023-09-13 MED ORDER — OXYCODONE HCL 5 MG PO TABS
ORAL_TABLET | ORAL | Status: AC
  Filled 2023-09-13: qty 1

## 2023-09-13 MED ORDER — FENTANYL CITRATE PF 50 MCG/ML IJ SOSY
25.0000 ug | PREFILLED_SYRINGE | INTRAMUSCULAR | Status: DC | PRN
  Administered 2023-09-13 (×3): 50 ug via INTRAVENOUS
  Filled 2023-09-13 (×3): qty 1

## 2023-09-13 MED ORDER — PROPOFOL 10 MG/ML IV BOLUS
INTRAVENOUS | Status: AC
  Filled 2023-09-13: qty 20

## 2023-09-13 MED ORDER — LIDOCAINE HCL (PF) 2 % IJ SOLN
INTRAMUSCULAR | Status: DC | PRN
  Administered 2023-09-13: 40 mg via INTRADERMAL

## 2023-09-13 MED ORDER — ACETAMINOPHEN 10 MG/ML IV SOLN
1000.0000 mg | Freq: Four times a day (QID) | INTRAVENOUS | Status: DC
  Administered 2023-09-13: 1000 mg via INTRAVENOUS
  Filled 2023-09-13: qty 100

## 2023-09-13 MED ORDER — ACETAMINOPHEN 160 MG/5ML PO SOLN: 325.0000 mg | ORAL | Status: DC | PRN

## 2023-09-13 MED ORDER — CHLORHEXIDINE GLUCONATE 0.12 % MT SOLN
15.0000 mL | Freq: Once | OROMUCOSAL | Status: AC
  Administered 2023-09-13: 15 mL via OROMUCOSAL

## 2023-09-13 MED ORDER — INSULIN ASPART 100 UNIT/ML IJ SOLN: 0.0000 [IU] | INTRAMUSCULAR | Status: DC | PRN

## 2023-09-13 MED ORDER — ORAL CARE MOUTH RINSE: 15.0000 mL | Freq: Once | OROMUCOSAL | Status: AC

## 2023-09-13 MED ORDER — ACETAMINOPHEN 325 MG PO TABS: 325.0000 mg | ORAL_TABLET | ORAL | Status: DC | PRN

## 2023-09-13 MED ORDER — TRAMADOL HCL 50 MG PO TABS
50.0000 mg | ORAL_TABLET | Freq: Four times a day (QID) | ORAL | 0 refills | Status: AC | PRN
  Filled 2023-09-13 (×2): qty 20, 3d supply, fill #0

## 2023-09-13 MED ORDER — LACTATED RINGERS IV SOLN: INTRAVENOUS | Status: DC

## 2023-09-13 MED ORDER — KETOROLAC TROMETHAMINE 15 MG/ML IJ SOLN
INTRAMUSCULAR | Status: AC
  Filled 2023-09-13: qty 1

## 2023-09-13 MED ORDER — LIDOCAINE HCL (PF) 2 % IJ SOLN
INTRAMUSCULAR | Status: AC
  Filled 2023-09-13: qty 5

## 2023-09-13 SURGICAL SUPPLY — 14 items
BAG COUNTER SPONGE SURGICOUNT (BAG) IMPLANT
BNDG ADH 1X3 SHEER STRL LF (GAUZE/BANDAGES/DRESSINGS) IMPLANT
COVER SURGICAL LIGHT HANDLE (MISCELLANEOUS) ×1 IMPLANT
GAUZE SPONGE 4X4 12PLY STRL (GAUZE/BANDAGES/DRESSINGS) IMPLANT
GLOVE BIO SURGEON STRL SZ 6.5 (GLOVE) ×2 IMPLANT
GLOVE BIO SURGEON STRL SZ8 (GLOVE) ×2 IMPLANT
GLOVE BIOGEL PI IND STRL 7.0 (GLOVE) ×2 IMPLANT
GLOVE BIOGEL PI IND STRL 8 (GLOVE) ×1 IMPLANT
GLOVE SURG SS PI 7.0 STRL IVOR (GLOVE) ×1 IMPLANT
GOWN STRL REUS W/ TWL LRG LVL3 (GOWN DISPOSABLE) ×1 IMPLANT
KIT TURNOVER KIT A (KITS) IMPLANT
NDL SAFETY ECLIPSE 18X1.5 (NEEDLE) IMPLANT
SWABSTICK PVP IODINE (MISCELLANEOUS) ×1 IMPLANT
SYR CONTROL 10ML LL (SYRINGE) IMPLANT

## 2023-09-13 NOTE — Op Note (Signed)
  OPERATIVE REPORT   PREOPERATIVE DIAGNOSIS: Arthrofibrosis, Right  knee.   POSTOPERATIVE DIAGNOSIS: Arthrofibrosis, Right knee.   PROCEDURE:  Right  knee closed manipulation.   SURGEON: Ollen Gross, MD   ASSISTANT: None.   ANESTHESIA: General.   COMPLICATIONS: None.   CONDITION: Stable to Recovery.   Pre-manipulation range of motion is 15=80.  Post-manipulation range of  Motion is 0-125  PROCEDURE IN DETAIL: After successful administration of general  anesthetic, exam under anesthesia was performed showing range of motion  15-80 degrees. I then placed my chest against the proximal tibia,  flexing the knee with audible lysis of adhesions. I was easily able to  get the knee flexed to 125  degrees. I then put the knee back in extension and with some  patellar manipulation and gentle pressure got to full  Extension.The patient was subsequently awakened and transported to Recovery in  stable condition.

## 2023-09-13 NOTE — Anesthesia Preprocedure Evaluation (Signed)
Anesthesia Evaluation  Patient identified by MRN, date of birth, ID band Patient awake    Reviewed: Allergy & Precautions, NPO status , Patient's Chart, lab work & pertinent test results, reviewed documented beta blocker date and time   Airway Mallampati: III  TM Distance: >3 FB Neck ROM: Full    Dental  (+) Teeth Intact, Dental Advisory Given   Pulmonary asthma    breath sounds clear to auscultation       Cardiovascular hypertension, Pt. on medications and Pt. on home beta blockers + dysrhythmias Atrial Fibrillation + Valvular Problems/Murmurs  Rhythm:Regular Rate:Normal     Neuro/Psych negative neurological ROS  negative psych ROS   GI/Hepatic negative GI ROS, Neg liver ROS,,,  Endo/Other  diabetes, Type 2, Oral Hypoglycemic AgentsHypothyroidism    Renal/GU Renal disease     Musculoskeletal   Abdominal   Peds  Hematology   Anesthesia Other Findings   Reproductive/Obstetrics                             Anesthesia Physical Anesthesia Plan  ASA: 3  Anesthesia Plan: General   Post-op Pain Management: Tylenol PO (pre-op)* and Toradol IV (intra-op)*   Induction: Intravenous  PONV Risk Score and Plan: 4 or greater and Ondansetron and Treatment may vary due to age or medical condition  Airway Management Planned: Mask  Additional Equipment: None  Intra-op Plan:   Post-operative Plan:   Informed Consent: I have reviewed the patients History and Physical, chart, labs and discussed the procedure including the risks, benefits and alternatives for the proposed anesthesia with the patient or authorized representative who has indicated his/her understanding and acceptance.     Dental advisory given  Plan Discussed with: CRNA  Anesthesia Plan Comments:        Anesthesia Quick Evaluation

## 2023-09-13 NOTE — Transfer of Care (Signed)
Immediate Anesthesia Transfer of Care Note  Patient: Hannah [name]  Procedure(s) Performed: CLOSED MANIPULATION KNEE (Right: Knee)  Patient Location: PACU  Anesthesia Type:MAC  Level of Consciousness: drowsy and patient cooperative  Airway & Oxygen Therapy: Patient Spontanous Breathing and Patient connected to nasal cannula oxygen  Post-op Assessment: Report given to RN and Post -op Vital signs reviewed and stable  Post vital signs: Reviewed and stable  Last Vitals:  Vitals Value Taken Time  BP 127/85 09/13/23 1624  Temp    Pulse 82 09/13/23 1625  Resp 19 09/13/23 1625  SpO2 100 % 09/13/23 1625  Vitals shown include unfiled device data.  Last Pain:  Vitals:   09/13/23 1547  TempSrc: Oral  PainSc: 5       Patients Stated Pain Goal: 0 (09/13/23 1547)  Complications: No notable events documented.

## 2023-09-13 NOTE — Anesthesia Postprocedure Evaluation (Signed)
Anesthesia Post Note  Patient: Hannah [name]  Procedure(s) Performed: CLOSED MANIPULATION KNEE (Right: Knee)     Patient location during evaluation: PACU Anesthesia Type: General Level of consciousness: awake and alert Pain management: pain level controlled Vital Signs Assessment: post-procedure vital signs reviewed and stable Respiratory status: spontaneous breathing, nonlabored ventilation, respiratory function stable and patient connected to nasal cannula oxygen Cardiovascular status: blood pressure returned to baseline and stable Postop Assessment: no apparent nausea or vomiting Anesthetic complications: no  No notable events documented.  Last Vitals:  Vitals:   09/13/23 1700 09/13/23 1715  BP: (!) 154/63 130/71  Pulse: 61 65  Resp: 14 (!) 22  Temp: 37.2 C   SpO2: 95% 100%    Last Pain:  Vitals:   09/13/23 1715  TempSrc:   PainSc: 5                  Shelton Silvas

## 2023-09-13 NOTE — H&P (Signed)
CC- Hannah [name] is a 77 y.o. female who presents with right knee stiffness.  HPI- . Knee Pain: Patient presents with stiffness involving the  right knee. Onset of the symptoms was several weeks ago. Inciting event:  She had a right Total Knee arthroplasty in 10/24 and has had persistent stiffness and inability to have a functional range of motion despite adequate physical therapy.  . She presents today for closed manipulation right knee  Past Medical History:  Diagnosis Date   Allergy 25 years ago.   Anemia    Arthritis    Asthma    Atrial fibrillation (HCC)    Cataract Had cataracts surgery 2015 & 2016   Chronic kidney disease    Diabetes mellitus without complication (HCC)    Gout 12/17/2014   patient reported   Heart murmur 4 years ago   Hyperlipidemia    Hypertension    Hypothyroidism    Systemic lupus erythematosus (HCC)    Vitiligo     Past Surgical History:  Procedure Laterality Date   CATARACT EXTRACTION Bilateral 2015   DILATION AND CURETTAGE OF UTERUS     DOPPLER ECHOCARDIOGRAPHY  05/2018   Internist to review with pt; potential heart murmur 06/20/18   EYE SURGERY  Cataract Surgery, 2015 & 2016.   keratosis removal  2021   KNEE CLOSED REDUCTION Left 07/06/2022   Procedure: CLOSED MANIPULATION KNEE;  Surgeon: Ollen Gross, MD;  Location: [facility];  Service: Orthopedics;  Laterality: Left;   REPLACEMENT TOTAL KNEE Left    SKIN SURGERY  11/30/2018   left side of face   TOOTH EXTRACTION     TOTAL KNEE ARTHROPLASTY Left 02/02/2022   Procedure: TOTAL KNEE ARTHROPLASTY;  Surgeon: Ollen Gross, MD;  Location: [facility];  Service: Orthopedics;  Laterality: Left;   TOTAL KNEE ARTHROPLASTY Right 07/12/2023   Procedure: RIGHT TOTAL KNEE ARTHROPLASTY;  Surgeon: Ollen Gross, MD;  Location: [facility];  Service: Orthopedics;  Laterality: Right;    Prior to Admission medications   Medication Sig Start Date End Date Taking? Authorizing Provider  allopurinol (ZYLOPRIM) 100  MG tablet Take 1 tablet (100 mg total) by mouth 3 (three) times a week for 90 doses. Patient taking differently: Take 100 mg by mouth every Monday, Wednesday, and Friday. 11/11/22 06/22/25 Yes Dorothyann Peng, MD  amLODipine (NORVASC) 5 MG tablet Take 1 tablet (5 mg total) by mouth at bedtime. 07/07/23  Yes Dorothyann Peng, MD  apixaban (ELIQUIS) 5 MG TABS tablet Take 1 tablet (5 mg total) by mouth 2 (two) times daily. 12/23/22 06/22/24 Yes Nahser, Deloris Ping, MD  Ascorbic Acid (VITAMIN C PO) Take 500 mg by mouth daily with lunch.   Yes [provider]  atorvastatin (LIPITOR) 80 MG tablet TAKE 1 TABLET BY MOUTH EVERY DAY 07/12/23  Yes Dorothyann Peng, MD  B Complex-C (B-COMPLEX WITH VITAMIN C) tablet Take 1 tablet by mouth daily with lunch.   Yes [provider]  Calcium Carb-Cholecalciferol 600-800 MG-UNIT TABS Take 1 tablet by mouth daily with lunch.   Yes [provider]  Cholecalciferol (VITAMIN D PO) Take 2,000 Units by mouth daily with lunch.   Yes [provider]  hydroxychloroquine (PLAQUENIL) 200 MG tablet TAKE 1 TABLET BY MOUTH IN THE MORNING AND 1/2 TABLET IN THE EVENING MONDAY-FRIDAY ONLY 07/16/23  Yes Deveshwar, Janalyn Rouse, MD  irbesartan (AVAPRO) 300 MG tablet TAKE 1 TABLET(300 MG) BY MOUTH DAILY 01/08/23  Yes Dorothyann Peng, MD  JARDIANCE 10 MG TABS tablet Take 1  tablet (10 mg total) by mouth in the morning. 08/23/23  Yes Dorothyann Peng, MD  metoprolol succinate (TOPROL-XL) 25 MG 24 hr tablet TAKE 1 TABLET(25 MG) BY MOUTH DAILY 04/05/23  Yes Nahser, Deloris Ping, MD  Multiple Vitamin (MULTIVITAMIN WITH MINERALS) TABS tablet Take 1 tablet by mouth daily with lunch. Women's Multivitamin   Yes [provider]  Polyethyl Glycol-Propyl Glycol (SYSTANE) 0.4-0.3 % SOLN Place 1-2 drops into both eyes 3 (three) times daily as needed (dry/irritated eyes.).   Yes [provider]  SYNTHROID 88 MCG tablet TAKE 1 TABLET BY MOUTH EVERY DAY MONDAY TO SATURDAY AND OFF ON  SUNDAYS Patient taking differently: Take 44-88 mcg by mouth See admin instructions. TAKE 88 mcg BY MOUTH EVERY DAY MONDAY TO SATURDAY AND 44 mg on Sunday in the morning 07/29/22  Yes Dorothyann Peng, MD  Accu-Chek Softclix Lancets lancets Use to check blood sugars daily E11.69 11/04/20   Dorothyann Peng, MD  albuterol (PROVENTIL HFA;VENTOLIN HFA) 108 (90 Base) MCG/ACT inhaler Inhale 2 puffs into the lungs every 6 (six) hours as needed for wheezing or shortness of breath. 09/02/18   Arnette Felts, FNP  Alcohol Swabs (ALCOHOL PADS) 70 % PADS Use as directed to check blood sugars 1 time per day dx: e11.22 08/03/22   Dorothyann Peng, MD  budesonide-formoterol (SYMBICORT) 160-4.5 MCG/ACT inhaler INHALE 2 PUFFS BY MOUTH TWICE DAILY IN THE MORNING AND IN THE EVENING Patient taking differently: Inhale 2 puffs into the lungs 2 (two) times daily as needed (Asthma). 09/08/22   Dorothyann Peng, MD  denosumab (PROLIA) 60 MG/ML SOSY injection Inject 60 mg into the skin every 6 (six) months. Courier to rheum: [address], [address], [place] [address]. Appt on 12/07/2022 11/26/22 11/26/23  Pollyann Savoy, MD  glucose blood (ACCU-CHEK GUIDE) test strip USE TO CHECK BLOOD SUGAR DAILY 02/23/23   Dorothyann Peng, MD  ondansetron (ZOFRAN) 4 MG tablet Take 1 tablet (4 mg total) by mouth every 6 (six) hours as needed for nausea. Patient not taking: Reported on 09/09/2023 07/13/23   Derenda Fennel, PA   Right knee Exam antalgic gait, no warmth or effusion, reduced range of motion (15-80), collateral ligaments intact  Physical Examination: General appearance - alert, well appearing, and in no distress Mental status - alert, oriented to person, place, and time Chest - clear to auscultation, no wheezes, rales or rhonchi, symmetric air entry Heart - normal rate, regular rhythm, normal S1, S2, no murmurs, rubs, clicks or gallops Abdomen - soft, nontender, nondistended, no masses or organomegaly Neurological - alert,  oriented, normal speech, no focal findings or movement disorder noted   Asessment/Plan--- Right knee arthrofibrosis- - Plan right knee closed manipulation. Procedure risks and potential comps discussed with patient who elects to proceed. Goals are decreased pain and increased function with a high likelihood of achieving both  righ

## 2023-09-13 NOTE — Therapy (Signed)
OUTPATIENT PHYSICAL THERAPY LOWER EXTREMITY TREATMENT   Patient Name: Hannah [name] MRN: [id number] DOB:1946-05-05, 77 y.o., female Today's Date: 09/14/2023  END OF SESSION:  PT End of Session - 09/14/23 1736     Visit Number 15    Number of Visits 17    Date for PT Re-Evaluation 10/22/23    Authorization Type [employer]    Authorization Time Period Approved 12 visits 09/06/23-10/23/23    Authorization - Visit Number 2    Authorization - Number of Visits 12    Progress Note Due on Visit 10    PT Start Time 1435    PT Stop Time 1525    PT Time Calculation (min) 50 min    Equipment Utilized During Treatment Other (comment)   RW   Activity Tolerance Patient limited by pain    Behavior During Therapy WFL for tasks assessed/performed                         Past Medical History:  Diagnosis Date   Allergy 25 years ago.   Anemia    Arthritis    Asthma    Atrial fibrillation (HCC)    Cataract Had cataracts surgery 2015 & 2016   Chronic kidney disease    Diabetes mellitus without complication (HCC)    Gout 12/17/2014   patient reported   Heart murmur 4 years ago   Hyperlipidemia    Hypertension    Hypothyroidism    Systemic lupus erythematosus (HCC)    Vitiligo    Past Surgical History:  Procedure Laterality Date   CATARACT EXTRACTION Bilateral 2015   DILATION AND CURETTAGE OF UTERUS     DOPPLER ECHOCARDIOGRAPHY  05/2018   Internist to review with pt; potential heart murmur 06/20/18   EYE SURGERY  Cataract Surgery, 2015 & 2016.   keratosis removal  2021   KNEE CLOSED REDUCTION Left 07/06/2022   Procedure: CLOSED MANIPULATION KNEE;  Surgeon: Ollen Gross, MD;  Location: [facility];  Service: Orthopedics;  Laterality: Left;   KNEE CLOSED REDUCTION Right 09/13/2023   Procedure: CLOSED MANIPULATION KNEE;  Surgeon: Ollen Gross, MD;  Location: [facility];  Service: Orthopedics;  Laterality: Right;   REPLACEMENT TOTAL KNEE Left    SKIN SURGERY   11/30/2018   left side of face   TOOTH EXTRACTION     TOTAL KNEE ARTHROPLASTY Left 02/02/2022   Procedure: TOTAL KNEE ARTHROPLASTY;  Surgeon: Ollen Gross, MD;  Location: [facility];  Service: Orthopedics;  Laterality: Left;   TOTAL KNEE ARTHROPLASTY Right 07/12/2023   Procedure: RIGHT TOTAL KNEE ARTHROPLASTY;  Surgeon: Ollen Gross, MD;  Location: [facility];  Service: Orthopedics;  Laterality: Right;   Patient Active Problem List   Diagnosis Date Noted   Pure hypercholesterolemia 07/18/2023   Personal history of COVID-19 07/18/2023   Primary osteoarthritis of right knee 07/12/2023   Acquired thrombophilia (HCC) 01/12/2023   Arthrofibrosis of knee joint, right 07/06/2022   OA (osteoarthritis) of knee 02/02/2022   Primary osteoarthritis of left knee 02/02/2022   Chronic bilateral low back pain without sciatica 06/25/2021   Paresthesia and pain of extremity 06/25/2021   Atrial fibrillation (HCC) 04/16/2021   Demand ischemia (HCC) 03/26/2021   Unspecified atrial fibrillation (HCC) 03/26/2021   COVID-19 virus infection 03/26/2021   PVC (premature ventricular contraction) 05/06/2020   Type 2 diabetes mellitus with stage 3 chronic kidney disease, without long-term current use of insulin (HCC) 03/20/2020   Palpitations 03/20/2020  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   Primary hypothyroidism 03/20/2020   Vitiligo 03/20/2020   Class 2 severe obesity due to excess calories with serious comorbidity and body mass index (BMI) of 35.0 to 35.9 in adult (HCC) 10/31/2019   Iron deficiency anemia 08/12/2017   Osteoporosis 10/02/2016   Systemic lupus erythematosus (HCC) 10/02/2016   Rheumatoid arthritis involving multiple sites with positive rheumatoid factor (HCC) 10/02/2016   High risk medication use 10/02/2016   History of chronic kidney disease 10/02/2016   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/2016   Primary osteoarthritis of both knees 10/02/2016   Vitamin  D deficiency 10/02/2016   History of diabetes mellitus 10/02/2016   History of hypertension 10/02/2016   History of asthma 10/02/2016   Absolute anemia    MGUS (monoclonal gammopathy of unknown significance)    Normocytic anemia 03/21/2015   Essential hypertension 03/20/2015   Abnormal CT of the chest 07/30/2012   Asthma 06/19/2012    PCP: Dorothyann Peng, MD  REFERRING PROVIDER: Ollen Gross, MD   REFERRING DIAG: M17.11 (ICD-10-CM) - Unilateral primary osteoarthritis, right knee   THERAPY DIAG:  Chronic pain of right knee  Muscle weakness (generalized)  Difficulty in walking, not elsewhere classified  Rationale for Evaluation and Treatment: Rehabilitation  ONSET DATE: 07/12/23  SUBJECTIVE:   SUBJECTIVE STATEMENT: Pt reports she underwent a knee manipulation yesterday. Pt states Dr. Lequita Halt [place] going to order a JAS knee brace. Pt reports she completes her ROM exs at home 4x daily.  EVAL: Pt reports she has been completing her HEP 1x daily and using cold packs on a frequent basis for pain and swelling management.  PAIN:  Are you having pain? Yes: NPRS scale: 5/10; pain range 5-10/10 Pain location: R knee Pain description: ache ,throb ,sharp Aggravating factors: movement, lifting, wt bearing Relieving factors: Pain medication, muscle relaxer, cold packs  PERTINENT HISTORY: High BMI, L LKA, Afib  PRECAUTIONS: Knee  RED FLAGS: None   WEIGHT BEARING RESTRICTIONS: No  FALLS:  Has patient fallen in last 6 months? No  LIVING ENVIRONMENT: Lives with: lives alone Lives in: House/apartment Stairs: No Has following equipment at home: Single point cane, Environmental consultant - 2 wheeled, Wheelchair (manual), and bed side commode  OCCUPATION: Retired  PLOF: Independent with household mobility with device  PATIENT GOALS: good use of my R knee  NEXT MD VISIT: 07/27/23  OBJECTIVE:  Note: Objective measures were completed at Evaluation unless otherwise noted.  DIAGNOSTIC  FINDINGS: None for the R knee in Epic  PATIENT SURVEYS:  FOTO: Perceived function   22%, predicted   43%   COGNITION: Overall cognitive status: Within functional limits for tasks assessed     SENSATION: WFL  EDEMA:  Significant R knee edema present   MUSCLE LENGTH: Hamstrings: Right NT deg; Left NT deg Maisie Fus test: Right NT deg; Left NT deg  POSTURE: rounded shoulders, forward head, and anterior pelvic tilt  PALPATION: TTP to the R peri-knee  LOWER EXTREMITY ROM:  Active ROM Right eval Left eval Rt 07/20/23 Rt 07/22/23 RT 07/27/23 Rt 07/29/23 08/03/23 Rt 08/06/23  Hip flexion          Hip extension          Hip abduction          Hip adduction          Hip internal rotation          Hip external rotation  Knee flexion 50  70 AA 80 AA75 AA85 AA 72 AA85  Knee extension 30  30 30 23  25d  30  Ankle dorsiflexion          Ankle plantarflexion          Ankle inversion          Ankle eversion           (Blank rows = not tested) 08/11/23:30-85d. 08/19/23=AAROM 30-90d. 08/31/23=AA90d   09/02/23=AA 30-85d. 09/08/23=AA30-90. 09/14/23=AA34-80.   LOWER EXTREMITY MMT:  MMT Right eval Left eval RT 07/27/23 Rt 09/02/23  Hip flexion 2  3 with limited ROM 4+  Hip extension 2   4+  Hip abduction 2   4+  Hip adduction      Hip internal rotation      Hip external rotation 2   4+  Knee flexion 2   4+ with limited ROM  Knee extension 2  3 with limited ROM 4+ with limited ROM  Ankle dorsiflexion      Ankle plantarflexion      Ankle inversion      Ankle eversion       (Blank rows = not tested)  LOWER EXTREMITY SPECIAL TESTS:  NT  FUNCTIONAL TESTS:  5 times sit to stand: TBA 2 minute walk test: TBA  5xSTS=24.7 c arm rest  2MWT=78' c RW GAIT: Distance walked: 59ft Assistive device utilized: Environmental consultant - 2 wheeled Level of assistance: SBA Comments: Antalgic over the R LE  TRANSFER: Mod A for mat table transfer c A for the R LE  TODAY'S TREATMENT:  [facility] Adult  PT Treatment:                                                DATE: 09/14/23 Therapeutic Exercise: Nustep L5 x 6 minutes UE/LE Seated knee flexion x5 20" pt over pressure  Supine knee flexion c strap and PT over pressure Supine knee ext c PT over pressure Manual Therapy: Patella mobs all planes  A/P and P/A fem/tib grade 3 mobs  Contract/relax knee flexion and ext Self Care: Pt is to focus on R knee ROM exs from her HEP Measurements for JAS knee brace  [facility] Adult PT Treatment:                                                DATE: 09/07/23 Therapeutic Exercise: Nustep L5 x 6 minutes UE/LE LAQ 2x10 5# Standing hip abd 2x10 5# each Standing hip ext 2x10 5# each Standing knee lifts 2x10 5# each Standing leg curls 2x10 5# R Standing hip ext 2x10 5# each Seated knee flexion x5 20" pt over pressure  Supine knee flexion c strap and PT over pressure Manual Therapy: Supine PROM knee ext  Patella mobs all planes  A/P and P/A fem/tib grade 3 mobs   [facility] Adult PT Treatment:                                                DATE: 09/02/23 Therapeutic Exercise: Seated knee flexion/ext x10 on towel c 20" pt over pressure  each direction Supine knee ext on transfer board x10 c 20" over pressure by PT Supine knee flexion on transfer board x10 Supine knee flexion on transfer board x10 c pt over pressure with strap 20" SLR 2x10 5# S/L hip abd 2x10 3# SAQ 2x10 5# MMT  PATIENT EDUCATION:  Education details: Eval findings, POC, HEP, self care  Person educated: Patient Education method: Explanation, Demonstration, Tactile cues, Verbal cues, and Handouts Education comprehension: verbalized understanding, returned demonstration, verbal cues required, and tactile cues required  HOME EXERCISE PROGRAM: Access Code: [email or web address] URL: [email or web address] Date: 08/19/2023 Prepared by: Joellyn Rued  Exercises - Long Sitting Quad Set (Mirrored)  - 3 x daily - 7 x weekly - 1 sets - 10 reps - 5  hold - Supine Heel Slide with Strap  - 3 x daily - 7 x weekly - 1 sets - 10 reps - 10 hold - Supine Knee Extension Strengthening (Mirrored)  - 3 x daily - 7 x weekly - 1 sets - 10 reps - 3 hold - Supine Hip Abduction AROM (Mirrored)  - 3 x daily - 7 x weekly - 1 sets - 10 reps - 3 hold - Seated Knee Flexion Slide  - 3 x daily - 7 x weekly - 1 sets - 10 reps - 10 hold - Active Straight Leg Raise with Quad Set  - 3 x daily - 7 x weekly - 1 sets - 10 reps - Seated Knee Extension Stretch with Chair  - 3 x daily - 7 x weekly - 1 sets  ASSESSMENT:  CLINICAL IMPRESSION: Initially after having a R knee manipulation yesterday, the pt's AAROM measurements are minimally worse. PT was focused on manual techniques and therex to improve R knee ROM following having a manipulation completed yesterday. Both flexion and ext demonstrate hard end feels. Measurements for a JAS knee brace were taken due to the pt reporting the MD is planning to send this clinic a script for one. Will send the the measurement form to the JAS area rep. Pt tolerated PT today without adverse effects.   Eval: Patient is a 77 y.o. female who was seen today for physical therapy evaluation and treatment for M17.11 (ICD-10-CM) - Unilateral primary osteoarthritis, right knee . RIGHT TOTAL KNEE ARTHROPLASTY on 07/12/2023. Pt presents with markedly decreased R knee AROM and strength, and decreased functional mobility. Pt will benefit from skilled PT 2w8 to address impairments to optimize R LE/knee function with less pain.   OBJECTIVE IMPAIRMENTS: decreased activity tolerance, decreased mobility, difficulty walking, decreased ROM, decreased strength, increased edema, obesity, and pain.   ACTIVITY LIMITATIONS: carrying, lifting, bending, sitting, standing, squatting, sleeping, stairs, transfers, bed mobility, bathing, toileting, dressing, locomotion level, and caring for others  PARTICIPATION LIMITATIONS: meal prep, cleaning, laundry, and  driving  PERSONAL FACTORS: Age, Fitness, Past/current experiences, Time since onset of injury/illness/exacerbation, and 1 comorbidity: high BMI  are also affecting patient's functional outcome.   REHAB POTENTIAL: Good  CLINICAL DECISION MAKING: Stable/uncomplicated  EVALUATION COMPLEXITY: Low   GOALS:  SHORT TERM GOALS: Target date: 08/06/23 Pt will be Ind in an initial HEP  Baseline: started Goal status: MET  2.  Pt will be able to complete a SLR and SAQ with or without quad lag Baseline: not able to complete 07/29/23: able to complete both with limited knee ext ROM present Goal status: MET  3.  Increase R knee AROM to 15-80d for improved R knee function Baseline: 30-50 Goal status: Not met, ROM for flexion reached  LONG TERM GOALS: Target date: 10/22/23  Pt will be Ind in a final HEP to maintain achieved LOF Baseline: started Goal status: ONGOING  2.  Increase R hip and knee strength to 4/5 for improved function use of the R LE Baseline: see flow sheets 09/02/23: see flow sheets Goal status: MET  3.  Increased R knee AROM to 5-115d for appropriate fuctional mobility Baseline: 30-50d 09/02/23: 30-85d Goal status: ONGOING  4.  Improve 5xSTS by MCID of 5" and by MCID of 62ft as indication of improved functional mobility  Baseline: TBA when pt has progressed to gait c a [facility] 08/31/23: 5xSTS=24.7 c arm rest      2MWT=78' c RW Goal status: ONGOING  5.  Pt's FOTO score will improved to the predicted value of 43% as indication of improved function  Baseline: 22% Goal status: ONGOING  6.  Pt will be able to walk 455ft c or s a SPC and ascend/descend 12 steps c or s a SPC and c use of a HR for community mobility Baseline: 40ft c RW Goal status: ONGOING   PLAN:  PT FREQUENCY: 2x/week  PT DURATION: 6 weeks  PLANNED INTERVENTIONS: 97164- PT Re-evaluation, 97110-Therapeutic exercises, 97530- Therapeutic activity, 97535- Self Care, 16109- Manual therapy, [id number]- Gait  training, 97014- Electrical stimulation (unattended), [id number]- Electrical stimulation (manual), 97016- Vasopneumatic device, Stair training, Dry Needling, Joint mobilization, Cryotherapy, and Moist heat  PLAN FOR NEXT SESSION: Review FOTO; assess response to HEP; progress therex as indicated; use of modalities, manual therapy; and TPDN as indicated.   Jaydien Panepinto MS, PT 09/14/23 5:52 PM

## 2023-09-13 NOTE — Anesthesia Procedure Notes (Signed)
Procedure Name: MAC Date/Time: 09/13/2023 4:07 PM  Performed by: Maurene Capes, CRNAPre-anesthesia Checklist: Patient identified, Emergency Drugs available, Suction available, Patient being monitored and Timeout performed Patient Re-evaluated:Patient Re-evaluated prior to induction Oxygen Delivery Method: Circle system utilized Preoxygenation: Pre-oxygenation with 100% oxygen Induction Type: IV induction Placement Confirmation: positive ETCO2 Dental Injury: Teeth and Oropharynx as per pre-operative assessment

## 2023-09-14 ENCOUNTER — Ambulatory Visit: Payer: Medicare HMO

## 2023-09-14 ENCOUNTER — Encounter (HOSPITAL_COMMUNITY): Payer: Self-pay | Admitting: Orthopedic Surgery

## 2023-09-14 DIAGNOSIS — K5730 Diverticulosis of large intestine without perforation or abscess without bleeding: Secondary | ICD-10-CM | POA: Diagnosis not present

## 2023-09-14 DIAGNOSIS — M6281 Muscle weakness (generalized): Secondary | ICD-10-CM | POA: Diagnosis not present

## 2023-09-14 DIAGNOSIS — K921 Melena: Secondary | ICD-10-CM | POA: Diagnosis not present

## 2023-09-14 DIAGNOSIS — R262 Difficulty in walking, not elsewhere classified: Secondary | ICD-10-CM | POA: Diagnosis not present

## 2023-09-14 DIAGNOSIS — G8929 Other chronic pain: Secondary | ICD-10-CM

## 2023-09-14 DIAGNOSIS — D509 Iron deficiency anemia, unspecified: Secondary | ICD-10-CM | POA: Diagnosis not present

## 2023-09-14 DIAGNOSIS — M25561 Pain in right knee: Secondary | ICD-10-CM | POA: Diagnosis not present

## 2023-09-16 ENCOUNTER — Ambulatory Visit: Payer: Medicare HMO | Admitting: Rheumatology

## 2023-09-16 DIAGNOSIS — Z8639 Personal history of other endocrine, nutritional and metabolic disease: Secondary | ICD-10-CM

## 2023-09-16 DIAGNOSIS — D472 Monoclonal gammopathy: Secondary | ICD-10-CM

## 2023-09-16 DIAGNOSIS — E559 Vitamin D deficiency, unspecified: Secondary | ICD-10-CM

## 2023-09-16 DIAGNOSIS — M0579 Rheumatoid arthritis with rheumatoid factor of multiple sites without organ or systems involvement: Secondary | ICD-10-CM

## 2023-09-16 DIAGNOSIS — I480 Paroxysmal atrial fibrillation: Secondary | ICD-10-CM

## 2023-09-16 DIAGNOSIS — Z79899 Other long term (current) drug therapy: Secondary | ICD-10-CM

## 2023-09-16 DIAGNOSIS — Z87448 Personal history of other diseases of urinary system: Secondary | ICD-10-CM

## 2023-09-16 DIAGNOSIS — Z8709 Personal history of other diseases of the respiratory system: Secondary | ICD-10-CM

## 2023-09-16 DIAGNOSIS — M1711 Unilateral primary osteoarthritis, right knee: Secondary | ICD-10-CM

## 2023-09-16 DIAGNOSIS — M3219 Other organ or system involvement in systemic lupus erythematosus: Secondary | ICD-10-CM

## 2023-09-16 DIAGNOSIS — Z7901 Long term (current) use of anticoagulants: Secondary | ICD-10-CM

## 2023-09-16 DIAGNOSIS — M810 Age-related osteoporosis without current pathological fracture: Secondary | ICD-10-CM

## 2023-09-16 DIAGNOSIS — M1A09X0 Idiopathic chronic gout, multiple sites, without tophus (tophi): Secondary | ICD-10-CM

## 2023-09-16 DIAGNOSIS — Z8679 Personal history of other diseases of the circulatory system: Secondary | ICD-10-CM

## 2023-09-16 DIAGNOSIS — Z96652 Presence of left artificial knee joint: Secondary | ICD-10-CM

## 2023-09-16 DIAGNOSIS — Z862 Personal history of diseases of the blood and blood-forming organs and certain disorders involving the immune mechanism: Secondary | ICD-10-CM

## 2023-09-20 ENCOUNTER — Ambulatory Visit: Payer: Self-pay

## 2023-09-20 NOTE — Patient Outreach (Signed)
Care Coordination   Follow Up Visit Note   09/20/2023 Name: Hannah [name] MRN: [id number] DOB: 1946-06-08  Hannah [name] is a 77 y.o. year old female who sees Hannah Peng, MD for primary care. I spoke with  Hannah [name] by phone today.  What matters to the patients health and wellness today?  Patient would like to make a full recovery from her right knee manipulation procedure.     Goals Addressed             This Visit's Progress    To have thyroid levels checked       Care Coordination Interventions: Evaluation of current treatment plan related to thyroid dysfunction and patient's adherence to plan as established by provider Confirmed patient completed her thyroid recheck Review of patient status, including review of consultant's reports, relevant laboratory and other test results, and medications completed          Component Ref Range & Units (hover) 1 mo ago (08/18/23) 1 yr ago (12/31/21) 1 yr ago (12/31/21) 2 yr ago (06/25/21) 2 yr ago (03/26/21) 2 yr ago (12/30/20) 2 yr ago (12/30/20)  TSH 5.790 High   1.320 1.020 0.690 R, CM  0.666  Free T4 1.51 1.44  1.43  1.44         To obtain assistance with cost of meds   On track    Care Coordination Interventions: Patient interviewed about adult health maintenance status including  barriers related to refilling and affording medications  Collaboration with Hannah [name] RPH-CPP regarding patient's PAP application for Jardiance and Synthroid, advised pharmacist patient's assistance for Eliquis was denied due to patient did not meet out of pocket requirement Informed patient her application was received and Hannah [name] RPH-CPP will follow up with the companies and let the patient know the outcome      To undergo right total knee surgery   On track    Care Coordination Interventions: Evaluation of current treatment plan related to arthritis of right knee  and patient's adherence to plan as established by  provider Determined patient underwent a right knee manipulation on 09/13/23 per Dr. Homero Fellers Alluisio with good effectiveness Discussed patient's plan of care includes ongoing PT, she is waiting to receive a waist high knee orthotic  Determined patient's pain is minimal, described as "achy", her sister continues to assist her as needed for meal prep and transportation  Reinforced fall precautions and to notify doctor of new symptoms or concerns promptly and patient is agreeable       Interventions Today    Flowsheet Row Most Recent Value  Chronic Disease   Chronic disease during today's visit Other  [s/p right knee manipulation,  hypothyroidism]  General Interventions   General Interventions Discussed/Reviewed General Interventions Discussed, General Interventions Reviewed, Doctor Visits, Durable Medical Equipment (DME), Labs, Communication with  Doctor Visits Discussed/Reviewed Doctor Visits Discussed, Doctor Visits Reviewed, Specialist, PCP  Durable Medical Equipment (DME) Other  Terressa Koyanagi brace]  Communication with Pharmacists  Magdalene Molly RPH-CPP]  Exercise Interventions   Exercise Discussed/Reviewed Exercise Reviewed, Exercise Discussed, Physical Activity  Physical Activity Discussed/Reviewed Physical Activity Reviewed, Physical Activity Discussed, Home Exercise Program (HEP)  Education Interventions   Education Provided Provided Education  Provided Verbal Education On When to see the doctor, Medication, Exercise, Labs, Nutrition  [thyroid]  Labs Reviewed Kidney Function  Nutrition Interventions   Nutrition Discussed/Reviewed Nutrition Discussed, Nutrition Reviewed, Increasing proteins, Adding fruits and vegetables  Pharmacy Interventions   Pharmacy Dicussed/Reviewed Pharmacy Topics  Discussed, Pharmacy Topics Reviewed, Medications and their functions, Medication Adherence          SDOH assessments and interventions completed:  No     Care Coordination Interventions:  Yes,  provided   Follow up plan: Follow up call scheduled for 11/10/23 @2 :00 PM    Encounter Outcome:  Patient Visit Completed

## 2023-09-20 NOTE — Patient Instructions (Addendum)
Visit Information  Thank you for taking time to visit with me today. Please don't hesitate to contact me if I can be of assistance to you.   Following are the goals we discussed today:   Goals Addressed             This Visit's Progress    To have thyroid levels checked       Care Coordination Interventions: Evaluation of current treatment plan related to thyroid dysfunction and patient's adherence to plan as established by provider Confirmed patient completed her thyroid recheck Review of patient status, including review of consultant's reports, relevant laboratory and other test results, and medications completed          Component Ref Range & Units (hover) 1 mo ago (08/18/23) 1 yr ago (12/31/21) 1 yr ago (12/31/21) 2 yr ago (06/25/21) 2 yr ago (03/26/21) 2 yr ago (12/30/20) 2 yr ago (12/30/20)  TSH 5.790 High   1.320 1.020 0.690 R, CM  0.666  Free T4 1.51 1.44  1.43  1.44         To obtain assistance with cost of meds   On track    Care Coordination Interventions: Patient interviewed about adult health maintenance status including  barriers related to refilling and affording medications  Collaboration with Catie Clearance Coots RPH-CPP regarding patient's PAP application for Jardiance and Synthroid, advised pharmacist patient's assistance for Eliquis was denied due to patient did not meet out of pocket requirement Informed patient her application was received and Catie Clearance Coots RPH-CPP will follow up with the companies and let the patient know the outcome      To undergo right total knee surgery   On track    Care Coordination Interventions: Evaluation of current treatment plan related to arthritis of right knee  and patient's adherence to plan as established by provider Determined patient underwent a right knee manipulation on 09/13/23 per Dr. Homero Fellers Alluisio with good effectiveness Discussed patient's plan of care includes ongoing PT, she is waiting to receive a waist high knee orthotic   Determined patient's pain is minimal, described as "achy", her sister continues to assist her as needed for meal prep and transportation  Reinforced fall precautions and to notify doctor of new symptoms or concerns promptly and patient is agreeable           Our next appointment is by telephone on 11/10/23 at 2:00 PM  Please call the care guide team at [phone] if you need to cancel or reschedule your appointment.   If you are experiencing a Mental Health or Behavioral Health Crisis or need someone to talk to, please call 1-800-273-TALK (toll free, 24 hour hotline)  Patient verbalizes understanding of instructions and care plan provided today and agrees to view in MyChart. Active MyChart status and patient understanding of how to access instructions and care plan via MyChart confirmed with patient.     Delsa Sale RN BSN CCM [place]  [facility] Nurse Care Coordinator  Direct Dial: [phone] Website: [email or web address]

## 2023-09-23 ENCOUNTER — Encounter (HOSPITAL_COMMUNITY): Payer: Self-pay

## 2023-09-23 ENCOUNTER — Other Ambulatory Visit: Payer: Self-pay

## 2023-09-23 ENCOUNTER — Observation Stay (HOSPITAL_COMMUNITY)
Admission: EM | Admit: 2023-09-23 | Discharge: 2023-09-28 | Disposition: A | Discharge status: Home / Self Care | Payer: Medicare HMO | Attending: Student | Admitting: Student

## 2023-09-23 DIAGNOSIS — R6883 Chills (without fever): Principal | ICD-10-CM | POA: Insufficient documentation

## 2023-09-23 DIAGNOSIS — I480 Paroxysmal atrial fibrillation: Secondary | ICD-10-CM | POA: Diagnosis not present

## 2023-09-23 DIAGNOSIS — R195 Other fecal abnormalities: Secondary | ICD-10-CM

## 2023-09-23 DIAGNOSIS — D649 Anemia, unspecified: Secondary | ICD-10-CM

## 2023-09-23 DIAGNOSIS — Z23 Encounter for immunization: Secondary | ICD-10-CM | POA: Diagnosis not present

## 2023-09-23 DIAGNOSIS — M069 Rheumatoid arthritis, unspecified: Secondary | ICD-10-CM | POA: Diagnosis present

## 2023-09-23 DIAGNOSIS — E039 Hypothyroidism, unspecified: Secondary | ICD-10-CM | POA: Diagnosis not present

## 2023-09-23 DIAGNOSIS — K2970 Gastritis, unspecified, without bleeding: Secondary | ICD-10-CM | POA: Diagnosis not present

## 2023-09-23 DIAGNOSIS — D500 Iron deficiency anemia secondary to blood loss (chronic): Secondary | ICD-10-CM | POA: Diagnosis not present

## 2023-09-23 DIAGNOSIS — Z87448 Personal history of other diseases of urinary system: Secondary | ICD-10-CM | POA: Diagnosis present

## 2023-09-23 DIAGNOSIS — D62 Acute posthemorrhagic anemia: Secondary | ICD-10-CM | POA: Diagnosis present

## 2023-09-23 DIAGNOSIS — Z96653 Presence of artificial knee joint, bilateral: Secondary | ICD-10-CM | POA: Insufficient documentation

## 2023-09-23 DIAGNOSIS — M329 Systemic lupus erythematosus, unspecified: Secondary | ICD-10-CM | POA: Diagnosis present

## 2023-09-23 DIAGNOSIS — M3219 Other organ or system involvement in systemic lupus erythematosus: Secondary | ICD-10-CM | POA: Diagnosis not present

## 2023-09-23 DIAGNOSIS — M0579 Rheumatoid arthritis with rheumatoid factor of multiple sites without organ or systems involvement: Secondary | ICD-10-CM | POA: Diagnosis present

## 2023-09-23 DIAGNOSIS — I129 Hypertensive chronic kidney disease with stage 1 through stage 4 chronic kidney disease, or unspecified chronic kidney disease: Secondary | ICD-10-CM | POA: Diagnosis not present

## 2023-09-23 DIAGNOSIS — K922 Gastrointestinal hemorrhage, unspecified: Principal | ICD-10-CM | POA: Diagnosis present

## 2023-09-23 DIAGNOSIS — N1831 Chronic kidney disease, stage 3a: Secondary | ICD-10-CM | POA: Insufficient documentation

## 2023-09-23 DIAGNOSIS — K5731 Diverticulosis of large intestine without perforation or abscess with bleeding: Secondary | ICD-10-CM | POA: Insufficient documentation

## 2023-09-23 DIAGNOSIS — R000 Tachycardia, unspecified: Secondary | ICD-10-CM | POA: Diagnosis not present

## 2023-09-23 DIAGNOSIS — K269 Duodenal ulcer, unspecified as acute or chronic, without hemorrhage or perforation: Secondary | ICD-10-CM | POA: Diagnosis not present

## 2023-09-23 DIAGNOSIS — K921 Melena: Secondary | ICD-10-CM | POA: Insufficient documentation

## 2023-09-23 DIAGNOSIS — K222 Esophageal obstruction: Secondary | ICD-10-CM | POA: Insufficient documentation

## 2023-09-23 DIAGNOSIS — Z1211 Encounter for screening for malignant neoplasm of colon: Secondary | ICD-10-CM | POA: Diagnosis not present

## 2023-09-23 DIAGNOSIS — I10 Essential (primary) hypertension: Secondary | ICD-10-CM | POA: Diagnosis present

## 2023-09-23 DIAGNOSIS — I4891 Unspecified atrial fibrillation: Secondary | ICD-10-CM | POA: Diagnosis present

## 2023-09-23 DIAGNOSIS — D509 Iron deficiency anemia, unspecified: Secondary | ICD-10-CM | POA: Insufficient documentation

## 2023-09-23 DIAGNOSIS — Z7901 Long term (current) use of anticoagulants: Secondary | ICD-10-CM | POA: Insufficient documentation

## 2023-09-23 DIAGNOSIS — R110 Nausea: Secondary | ICD-10-CM | POA: Diagnosis not present

## 2023-09-23 LAB — URINALYSIS, W/ REFLEX TO CULTURE (INFECTION SUSPECTED)
Bacteria, UA: NONE SEEN
Bilirubin Urine: NEGATIVE
Glucose, UA: >=500 mg/dL — AB
Hgb urine dipstick: NEGATIVE
Ketones, ur: 5 mg/dL — AB
Nitrite: NEGATIVE
Protein, ur: 30 mg/dL — AB
Specific Gravity, Urine: 1.014 (ref 1.005–1.030)
pH: 5 (ref 5.0–8.0)

## 2023-09-23 LAB — COMPREHENSIVE METABOLIC PANEL
ALT: 30 U/L (ref 0–44)
AST: 33 U/L (ref 15–41)
Albumin: 3.7 g/dL (ref 3.5–5.0)
Alkaline Phosphatase: 66 U/L (ref 38–126)
Anion gap: 10 (ref 5–15)
BUN: 23 mg/dL (ref 8–23)
CO2: 23 mmol/L (ref 22–32)
Calcium: 9.1 mg/dL (ref 8.9–10.3)
Chloride: 104 mmol/L (ref 98–111)
Creatinine, Ser: 1.32 mg/dL — ABNORMAL HIGH (ref 0.44–1.00)
GFR, Estimated: 41 mL/min — ABNORMAL LOW (ref >60)
Glucose, Bld: 105 mg/dL — ABNORMAL HIGH (ref 70–99)
Potassium: 4.6 mmol/L (ref 3.5–5.1)
Sodium: 137 mmol/L (ref 135–145)
Total Bilirubin: 2.4 mg/dL — ABNORMAL HIGH (ref <1.2)
Total Protein: 7 g/dL (ref 6.5–8.1)

## 2023-09-23 LAB — CBC WITH DIFFERENTIAL/PLATELET
Abs Immature Granulocytes: 0.12 10*3/uL — ABNORMAL HIGH (ref 0.00–0.07)
Basophils Absolute: 0.1 10*3/uL (ref 0.0–0.1)
Basophils Relative: 1 %
Eosinophils Absolute: 0.1 10*3/uL (ref 0.0–0.5)
Eosinophils Relative: 1 %
HCT: 20.1 % — ABNORMAL LOW (ref 36.0–46.0)
Hemoglobin: 6.2 g/dL — CL (ref 12.0–15.0)
Immature Granulocytes: 1 %
Lymphocytes Relative: 6 %
Lymphs Abs: 0.6 10*3/uL — ABNORMAL LOW (ref 0.7–4.0)
MCH: 31 pg (ref 26.0–34.0)
MCHC: 30.8 g/dL (ref 30.0–36.0)
MCV: 100.5 fL — ABNORMAL HIGH (ref 80.0–100.0)
Monocytes Absolute: 0.7 10*3/uL (ref 0.1–1.0)
Monocytes Relative: 7 %
Neutro Abs: 9.1 10*3/uL — ABNORMAL HIGH (ref 1.7–7.7)
Neutrophils Relative %: 84 %
Platelets: 381 10*3/uL (ref 150–400)
RBC: 2 MIL/uL — ABNORMAL LOW (ref 3.87–5.11)
RDW: 15.3 % (ref 11.5–15.5)
WBC: 10.7 10*3/uL — ABNORMAL HIGH (ref 4.0–10.5)
nRBC: 0.2 % (ref 0.0–0.2)

## 2023-09-23 LAB — POC OCCULT BLOOD, ED: Fecal Occult Bld: POSITIVE — AB

## 2023-09-23 LAB — RESP PANEL BY RT-PCR (RSV, FLU A&B, COVID)  RVPGX2
Influenza A by PCR: NEGATIVE
Influenza B by PCR: NEGATIVE
Resp Syncytial Virus by PCR: NEGATIVE
SARS Coronavirus 2 by RT PCR: NEGATIVE

## 2023-09-23 LAB — ABO/RH: ABO/RH(D): A POS

## 2023-09-23 LAB — PROTIME-INR
INR: 1.9 — ABNORMAL HIGH (ref 0.8–1.2)
Prothrombin Time: 22.3 s — ABNORMAL HIGH (ref 11.4–15.2)

## 2023-09-23 LAB — PREPARE RBC (CROSSMATCH)

## 2023-09-23 LAB — LACTATE DEHYDROGENASE: LDH: 274 U/L — ABNORMAL HIGH (ref 98–192)

## 2023-09-23 MED ORDER — ONDANSETRON HCL 4 MG/2ML IJ SOLN: 4.0000 mg | Freq: Four times a day (QID) | INTRAMUSCULAR | Status: DC | PRN

## 2023-09-23 MED ORDER — PANTOPRAZOLE SODIUM 40 MG IV SOLR
40.0000 mg | Freq: Two times a day (BID) | INTRAVENOUS | Status: DC
  Administered 2023-09-23 – 2023-09-28 (×10): 40 mg via INTRAVENOUS
  Filled 2023-09-23 (×10): qty 10

## 2023-09-23 MED ORDER — ACETAMINOPHEN 650 MG RE SUPP: 650.0000 mg | Freq: Four times a day (QID) | RECTAL | Status: DC | PRN

## 2023-09-23 MED ORDER — ONDANSETRON HCL 4 MG PO TABS: 4.0000 mg | ORAL_TABLET | Freq: Four times a day (QID) | ORAL | Status: DC | PRN

## 2023-09-23 MED ORDER — SODIUM CHLORIDE 0.9% IV SOLUTION: Freq: Once | INTRAVENOUS | Status: AC

## 2023-09-23 MED ORDER — ACETAMINOPHEN 325 MG PO TABS: 650.0000 mg | ORAL_TABLET | Freq: Four times a day (QID) | ORAL | Status: DC | PRN

## 2023-09-23 NOTE — Assessment & Plan Note (Addendum)
Anemia believed to be ABLA due to GIB given positive hemoccult, eliquis use. DDx includes ABLA due to bleeding into / around knee joint following procedure about 10 days ago, swelling improved at this time however, so probably not a whole lot of ongoing bleeding still. 1u PRBC transfusion Repeat CBC post transfusion Tele monitor T.Bili 2.4, slight elevation that's new for patient... Check haptoglobin and LDH, just-in-case pt has some sort of undiagnosed AIHA instead... Or this could simply be due to breakdown of blood products around knee joint following manipulation procedure?

## 2023-09-23 NOTE — ED Provider Notes (Incomplete)
[facility] Provider Note   CSN: [id number] Arrival date & time: 09/23/23  1445     History {Add pertinent medical, surgical, social history, OB history to HPI:1} Chief Complaint  Patient presents with   Chills         Hannah [name] is a 77 y.o. female.  Patient is a 77 year old female with past medical history of iron deficiency anemia requiring iron transfusions presenting for complaints of dizziness.  Patient admits to episode of dizziness, chills, and hands turning white that acutely developed today.  Patient called EMS and was brought immediately to the emergency department.  Hemoglobin on her lab testing today was 6.2 down from 9.6 ten days ago.  She denies any black or bloody stools.  Denies any hematuria or vaginal bleeding.  She recently had a closed manipulation of her right knee on 09/13/2023.  Bruising in her right knee is minimal.  Denies prior history of GI bleeds.  The history is provided by the patient. No language interpreter was used.       Home Medications Prior to Admission medications   Medication Sig Start Date End Date Taking? Authorizing Provider  Accu-Chek Softclix Lancets lancets Use to check blood sugars daily E11.69 11/04/20   Dorothyann Peng, MD  albuterol (PROVENTIL HFA;VENTOLIN HFA) 108 (90 Base) MCG/ACT inhaler Inhale 2 puffs into the lungs every 6 (six) hours as needed for wheezing or shortness of breath. 09/02/18   Arnette Felts, FNP  Alcohol Swabs (ALCOHOL PADS) 70 % PADS Use as directed to check blood sugars 1 time per day dx: e11.22 08/03/22   Dorothyann Peng, MD  allopurinol (ZYLOPRIM) 100 MG tablet Take 1 tablet (100 mg total) by mouth 3 (three) times a week for 90 doses. Patient taking differently: Take 100 mg by mouth every Monday, Wednesday, and Friday. 11/11/22 06/22/25  Dorothyann Peng, MD  amLODipine (NORVASC) 5 MG tablet Take 1 tablet (5 mg total) by mouth at bedtime. 07/07/23   Dorothyann Peng,  MD  apixaban (ELIQUIS) 5 MG TABS tablet Take 1 tablet (5 mg total) by mouth 2 (two) times daily. 12/23/22 06/22/24  Nahser, Deloris Ping, MD  Ascorbic Acid (VITAMIN C PO) Take 500 mg by mouth daily with lunch.    [provider]  atorvastatin (LIPITOR) 80 MG tablet TAKE 1 TABLET BY MOUTH EVERY DAY 07/12/23   Dorothyann Peng, MD  B Complex-C (B-COMPLEX WITH VITAMIN C) tablet Take 1 tablet by mouth daily with lunch.    [provider]  budesonide-formoterol (SYMBICORT) 160-4.5 MCG/ACT inhaler INHALE 2 PUFFS BY MOUTH TWICE DAILY IN THE MORNING AND IN THE EVENING Patient taking differently: Inhale 2 puffs into the lungs 2 (two) times daily as needed (Asthma). 09/08/22   Dorothyann Peng, MD  Calcium Carb-Cholecalciferol 600-800 MG-UNIT TABS Take 1 tablet by mouth daily with lunch.    [provider]  Cholecalciferol (VITAMIN D PO) Take 2,000 Units by mouth daily with lunch.    [provider]  denosumab (PROLIA) 60 MG/ML SOSY injection Inject 60 mg into the skin every 6 (six) months. Courier to rheum: [address], [place] [address]. Appt on 12/07/2022 11/26/22 11/26/23  Pollyann Savoy, MD  glucose blood (ACCU-CHEK GUIDE) test strip USE TO CHECK BLOOD SUGAR DAILY 02/23/23   Dorothyann Peng, MD  hydroxychloroquine (PLAQUENIL) 200 MG tablet TAKE 1 TABLET BY MOUTH IN THE MORNING AND 1/2 TABLET IN THE EVENING MONDAY-FRIDAY ONLY 07/16/23   Pollyann Savoy, MD  irbesartan (AVAPRO) 300 MG tablet TAKE 1 TABLET(300 MG) BY MOUTH DAILY 01/08/23   Dorothyann Peng, MD  JARDIANCE 10 MG TABS tablet Take 1 tablet (10 mg total) by mouth in the morning. 08/23/23   Dorothyann Peng, MD  metoprolol succinate (TOPROL-XL) 25 MG 24 hr tablet TAKE 1 TABLET(25 MG) BY MOUTH DAILY 04/05/23   Nahser, Deloris Ping, MD  Multiple Vitamin (MULTIVITAMIN WITH MINERALS) TABS tablet Take 1 tablet by mouth daily with lunch. Women's Multivitamin    [provider]  ondansetron (ZOFRAN) 4 MG tablet  Take 1 tablet (4 mg total) by mouth every 6 (six) hours as needed for nausea. Patient not taking: Reported on 09/09/2023 07/13/23   Edmisten, Lyn Hollingshead, PA  Polyethyl Glycol-Propyl Glycol (SYSTANE) 0.4-0.3 % SOLN Place 1-2 drops into both eyes 3 (three) times daily as needed (dry/irritated eyes.).    [provider]  SYNTHROID 88 MCG tablet TAKE 1 TABLET BY MOUTH EVERY DAY MONDAY TO SATURDAY AND OFF ON SUNDAYS Patient taking differently: Take 44-88 mcg by mouth See admin instructions. TAKE 88 mcg BY MOUTH EVERY DAY MONDAY TO SATURDAY AND 44 mg on Sunday in the morning 07/29/22   Dorothyann Peng, MD      Allergies    Shellfish allergy    Review of Systems   Review of Systems  Constitutional:  Positive for chills. Negative for fever.  HENT:  Negative for ear pain and sore throat.   Eyes:  Negative for pain and visual disturbance.  Respiratory:  Negative for cough and shortness of breath.   Cardiovascular:  Positive for palpitations. Negative for chest pain.  Gastrointestinal:  Negative for abdominal pain and vomiting.  Genitourinary:  Negative for dysuria and hematuria.  Musculoskeletal:  Negative for arthralgias and back pain.  Skin:  Negative for color change and rash.  Neurological:  Positive for dizziness. Negative for seizures and syncope.  All other systems reviewed and are negative.   Physical Exam Updated Vital Signs BP 132/67 (BP Location: Right Arm)   Pulse 76   Temp 98.8 F (37.1 C) (Oral)   Resp 18   Ht 4' 10.5" (1.486 m)   SpO2 95%   BMI 31.76 kg/m  Physical Exam Vitals and nursing note reviewed. Exam conducted with a chaperone present.  Constitutional:      General: She is not in acute distress.    Appearance: She is well-developed.  HENT:     Head: Normocephalic and atraumatic.  Eyes:     Conjunctiva/sclera: Conjunctivae normal.     Comments: Subconjunctival pallor present  Cardiovascular:     Rate and Rhythm: Normal rate and regular rhythm.      Heart sounds: No murmur heard. Pulmonary:     Effort: Pulmonary effort is normal. No respiratory distress.     Breath sounds: Normal breath sounds.  Abdominal:     Palpations: Abdomen is soft.     Tenderness: There is no abdominal tenderness.  Genitourinary:    Rectum: External hemorrhoid (nonbleeding) present. No mass, tenderness, anal fissure or internal hemorrhoid.     Comments: No gross blood Musculoskeletal:        General: No swelling.     Cervical back: Neck supple.  Skin:    General: Skin is warm and dry.     Capillary Refill: Capillary refill takes less than 2 seconds.     Coloration: Skin is pale. Cyanotic: distal fingers and toes.    Comments: Pallor of distal fingers  Neurological:  Mental Status: She is alert.     GCS: GCS eye subscore is 4. GCS verbal subscore is 5. GCS motor subscore is 6.  Psychiatric:        Mood and Affect: Mood normal.     ED Results / Procedures / Treatments   Labs (all labs ordered are listed, but only abnormal results are displayed) Labs Reviewed  COMPREHENSIVE METABOLIC PANEL - Abnormal; Notable for the following components:      Result Value   Glucose, Bld 105 (*)    Creatinine, Ser 1.32 (*)    Total Bilirubin 2.4 (*)    GFR, Estimated 41 (*)    All other components within normal limits  CBC WITH DIFFERENTIAL/PLATELET - Abnormal; Notable for the following components:   WBC 10.7 (*)    RBC 2.00 (*)    Hemoglobin 6.2 (*)    HCT 20.1 (*)    MCV 100.5 (*)    Neutro Abs 9.1 (*)    Lymphs Abs 0.6 (*)    Abs Immature Granulocytes 0.12 (*)    All other components within normal limits  URINALYSIS, W/ REFLEX TO CULTURE (INFECTION SUSPECTED) - Abnormal; Notable for the following components:   APPearance HAZY (*)    Glucose, UA >=500 (*)    Ketones, ur 5 (*)    Protein, ur 30 (*)    Leukocytes,Ua SMALL (*)    All other components within normal limits  PROTIME-INR - Abnormal; Notable for the following components:   Prothrombin Time  22.3 (*)    INR 1.9 (*)    All other components within normal limits  POC OCCULT BLOOD, ED - Abnormal; Notable for the following components:   Fecal Occult Bld POSITIVE (*)    All other components within normal limits  RESP PANEL BY RT-PCR (RSV, FLU A&B, COVID)  RVPGX2  TYPE AND SCREEN  PREPARE RBC (CROSSMATCH)    EKG None  Radiology No results found.  Procedures .Critical Care  Performed by: Franne Forts, DO Authorized by: Franne Forts, DO   Critical care provider statement:    Critical care time (minutes):  70   Critical care was time spent personally by me on the following activities:  Development of treatment plan with patient or surrogate, discussions with consultants, evaluation of patient's response to treatment, examination of patient, ordering and review of laboratory studies, ordering and review of radiographic studies, ordering and performing treatments and interventions, pulse oximetry, re-evaluation of patient's condition and review of old charts   Care discussed with: admitting provider     Care discussed with comment:  Admitting and GI Comments:     Acute blood loss anemia requiring blood transfusion    {Document cardiac monitor, telemetry assessment procedure when appropriate:1}  Medications Ordered in ED Medications  0.9 %  sodium chloride infusion (Manually program via Guardrails IV Fluids) (has no administration in time range)    ED Course/ Medical Decision Making/ A&P Clinical Course as of 09/23/23 1912  Thu Sep 23, 2023  1840 GI with Dr. Gilmer Mor in the past. Approx 3g drop in hgb in last 10 days. Fecal Occult positive [AG]  1841 Fecal Occult Blood, POC(!): POSITIVE [AG]  1841 Hemoglobin(!!): 6.2 [AG]    Clinical Course User Index [AG] Franne Forts, DO   {   Click here for ABCD2, HEART and other calculatorsREFRESH Note before signing :1}  Medical Decision Making Amount and/or Complexity of  Data Reviewed Labs: ordered. Decision-making details documented in ED Course.  Risk Prescription drug management. Decision regarding hospitalization.   77 year old female with past medical history of iron deficiency anemia requiring iron transfusions presenting for complaints of dizziness.  Patient is alert and oriented x 3, no acute distress, afebrile, stable vital signs.  Physical exam demonstrates subconjunctival pallor, pallor in the distal digits, and decreased capillary refill.  Patient's hemoglobin is down approximately 3 g in the last 10 days.  She denies any rectal bleeding however her fecal Hemoccult is positive.  Last hemoglobin was 9.5 on 09/13/2023.  Today it is 6.1.  Has seen GI specialist Dr. Loreta Ave in the past.  She agrees to be on consult with the patient.  1 unit packed red blood cells ordered and pending transfusion.  Spoke with admitting physician Dr.*Agrees to accept patient for acute blood loss anemia secondary to GI hemorrhage.  {Document critical care time when appropriate:1} {Document review of labs and clinical decision tools ie heart score, Chads2Vasc2 etc:1}  {Document your independent review of radiology images, and any outside records:1} {Document your discussion with family members, caretakers, and with consultants:1} {Document social determinants of health affecting pt's care:1} {Document your decision making why or why not admission, treatments were needed:1} Final Clinical Impression(s) / ED Diagnoses Final diagnoses:  Gastrointestinal hemorrhage, unspecified gastrointestinal hemorrhage type  Anemia, unspecified type    Rx / DC Orders ED Discharge Orders     None

## 2023-09-23 NOTE — ED Provider Triage Note (Signed)
Emergency Medicine Provider Triage Evaluation Note  Hannah [name] , a 77 y.o. female  was evaluated in triage.  Pt complains of chills, dizziness, and coldness in her hands that started this morning.  Review of Systems   Negative: Urinary symptoms, chest pain, shortness of breath, head trauma  Physical Exam  BP (!) 135/57   Pulse 79   Temp 98.7 F (37.1 C) (Oral)   Resp 19   Ht 4' 10.5" (1.486 m)   SpO2 100%   BMI 31.76 kg/m  Gen:   Awake, no distress   Resp:  Normal effort  MSK:   Moves extremities without difficulty  Neuro:  No gross deficits  Medical Decision Making  Medically screening exam initiated at 3:29 PM.  Appropriate orders placed.  Hannah [name] was informed that the remainder of the evaluation will be completed by another provider, this initial triage assessment does not replace that evaluation, and the importance of remaining in the ED until their evaluation is complete.     Halford Decamp, PA-C 09/23/23 1533

## 2023-09-23 NOTE — Assessment & Plan Note (Signed)
Hold eliquis in setting of GIB. Cont toprol

## 2023-09-23 NOTE — ED Triage Notes (Signed)
Per [facility], coming from home. Called out today for chills, cold fingers, a tremor. Pt states the tremor has resolved. Pt states she still feels as if she is not quite right.

## 2023-09-23 NOTE — Assessment & Plan Note (Addendum)
Manage ABLA with transfusion as above Hold eliquis Clear liquid diet, NPO after [facility] EDP d/w Dr. Loreta Ave who will see in consult. Empiric PPI

## 2023-09-23 NOTE — Assessment & Plan Note (Signed)
Hold norvasc and irbesartan Cont toprol for a.fib rate control.

## 2023-09-23 NOTE — Telephone Encounter (Signed)
PAP: Patient has been denied for pt assistance by PAP Companies: [employer] due to PT HAS NOT MET 3% REQUIREMENT FOR 2025, NEEDS TO SPEND ANOTHER $1,348 ON MEDICATIONS PER BMS IN ORDER TO QUALIFY FOR 2025. LETTER HAS BEEN MAILED TO PATIENT.

## 2023-09-23 NOTE — Assessment & Plan Note (Signed)
Creat 1.3 today, looks about baseline.

## 2023-09-23 NOTE — H&P (Addendum)
History and Physical    Patient: Hannah [name] DOB: 1946/03/13 DOA: 09/23/2023 DOS: the patient was seen and examined on 09/23/2023 PCP: Dorothyann Peng, MD  Patient coming from: Home  Chief Complaint:  Chief Complaint  Patient presents with   Chills        HPI: Hannah [name] is a 77 y.o. female with medical history significant of a.fib on eliquis, iron def anemia.  Pt in to ED with c/o dizziness, chills, hands turning white.  Symptoms onset today.  Pt called EMS.  HGB 6.2 down from 9.6 x10 days ago.  No black or bloody stools.  No hematuria, no vaginal bleeding.  Had closed manipulation of R knee on 12/16 with minimal surrounding bruising.  No prior h/o GIBs.  Did have increased swelling with bruising around knee joint following procedure.  This has improved significantly since that time.  Hemoccult positive in ED, EDP spoke with Dr. Loreta Ave.  Review of Systems: As mentioned in the history of present illness. All other systems reviewed and are negative. Past Medical History:  Diagnosis Date   Allergy 25 years ago.   Anemia    Arthritis    Asthma    Atrial fibrillation (HCC)    Cataract Had cataracts surgery 2015 & 2016   Chronic kidney disease    Diabetes mellitus without complication (HCC)    Gout 12/17/2014   patient reported   Heart murmur 4 years ago   Hyperlipidemia    Hypertension    Hypothyroidism    Systemic lupus erythematosus (HCC)    Vitiligo    Past Surgical History:  Procedure Laterality Date   CATARACT EXTRACTION Bilateral 2015   DILATION AND CURETTAGE OF UTERUS     DOPPLER ECHOCARDIOGRAPHY  05/2018   Internist to review with pt; potential heart murmur 06/20/18   EYE SURGERY  Cataract Surgery, 2015 & 2016.   keratosis removal  2021   KNEE CLOSED REDUCTION Left 07/06/2022   Procedure: CLOSED MANIPULATION KNEE;  Surgeon: Ollen Gross, MD;  Location: [facility];  Service: Orthopedics;  Laterality: Left;   KNEE CLOSED REDUCTION  Right 09/13/2023   Procedure: CLOSED MANIPULATION KNEE;  Surgeon: Ollen Gross, MD;  Location: [facility];  Service: Orthopedics;  Laterality: Right;   REPLACEMENT TOTAL KNEE Left    SKIN SURGERY  11/30/2018   left side of face   TOOTH EXTRACTION     TOTAL KNEE ARTHROPLASTY Left 02/02/2022   Procedure: TOTAL KNEE ARTHROPLASTY;  Surgeon: Ollen Gross, MD;  Location: [facility];  Service: Orthopedics;  Laterality: Left;   TOTAL KNEE ARTHROPLASTY Right 07/12/2023   Procedure: RIGHT TOTAL KNEE ARTHROPLASTY;  Surgeon: Ollen Gross, MD;  Location: [facility];  Service: Orthopedics;  Laterality: Right;   Social History:  reports that she has never smoked. She has never been exposed to tobacco smoke. She has never used smokeless tobacco. She reports that she does not currently use alcohol. She reports that she does not use drugs.  Allergies  Allergen Reactions   Shellfish Allergy Anaphylaxis and Hives    Family History  Problem Relation Age of Onset   Heart disease Father    Diabetes Father    Hypertension Mother    Obesity Mother    Varicose Veins Mother    Hypertension Sister    Hypertension Sister    Leukemia Sister    Cancer Sister    Obesity Sister     Prior to Admission medications   Medication Sig Start Date End Date  Taking? Authorizing Provider  Accu-Chek Softclix Lancets lancets Use to check blood sugars daily E11.69 11/04/20   Dorothyann Peng, MD  albuterol (PROVENTIL HFA;VENTOLIN HFA) 108 (90 Base) MCG/ACT inhaler Inhale 2 puffs into the lungs every 6 (six) hours as needed for wheezing or shortness of breath. 09/02/18   Arnette Felts, FNP  Alcohol Swabs (ALCOHOL PADS) 70 % PADS Use as directed to check blood sugars 1 time per day dx: e11.22 08/03/22   Dorothyann Peng, MD  allopurinol (ZYLOPRIM) 100 MG tablet Take 1 tablet (100 mg total) by mouth 3 (three) times a week for 90 doses. Patient taking differently: Take 100 mg by mouth every Monday, Wednesday, and Friday. 11/11/22 06/22/25   Dorothyann Peng, MD  amLODipine (NORVASC) 5 MG tablet Take 1 tablet (5 mg total) by mouth at bedtime. 07/07/23   Dorothyann Peng, MD  apixaban (ELIQUIS) 5 MG TABS tablet Take 1 tablet (5 mg total) by mouth 2 (two) times daily. 12/23/22 06/22/24  Nahser, Deloris Ping, MD  Ascorbic Acid (VITAMIN C PO) Take 500 mg by mouth daily with lunch.    [provider]  atorvastatin (LIPITOR) 80 MG tablet TAKE 1 TABLET BY MOUTH EVERY DAY 07/12/23   Dorothyann Peng, MD  B Complex-C (B-COMPLEX WITH VITAMIN C) tablet Take 1 tablet by mouth daily with lunch.    [provider]  budesonide-formoterol (SYMBICORT) 160-4.5 MCG/ACT inhaler INHALE 2 PUFFS BY MOUTH TWICE DAILY IN THE MORNING AND IN THE EVENING Patient taking differently: Inhale 2 puffs into the lungs 2 (two) times daily as needed (Asthma). 09/08/22   Dorothyann Peng, MD  Calcium Carb-Cholecalciferol 600-800 MG-UNIT TABS Take 1 tablet by mouth daily with lunch.    [provider]  Cholecalciferol (VITAMIN D PO) Take 2,000 Units by mouth daily with lunch.    [provider]  denosumab (PROLIA) 60 MG/ML SOSY injection Inject 60 mg into the skin every 6 (six) months. Courier to rheum: [address], [place] [address]. Appt on 12/07/2022 11/26/22 11/26/23  Pollyann Savoy, MD  glucose blood (ACCU-CHEK GUIDE) test strip USE TO CHECK BLOOD SUGAR DAILY 02/23/23   Dorothyann Peng, MD  hydroxychloroquine (PLAQUENIL) 200 MG tablet TAKE 1 TABLET BY MOUTH IN THE MORNING AND 1/2 TABLET IN THE EVENING MONDAY-FRIDAY ONLY 07/16/23   Pollyann Savoy, MD  irbesartan (AVAPRO) 300 MG tablet TAKE 1 TABLET(300 MG) BY MOUTH DAILY 01/08/23   Dorothyann Peng, MD  JARDIANCE 10 MG TABS tablet Take 1 tablet (10 mg total) by mouth in the morning. 08/23/23   Dorothyann Peng, MD  metoprolol succinate (TOPROL-XL) 25 MG 24 hr tablet TAKE 1 TABLET(25 MG) BY MOUTH DAILY 04/05/23   Nahser, Deloris Ping, MD  Multiple Vitamin (MULTIVITAMIN WITH MINERALS) TABS  tablet Take 1 tablet by mouth daily with lunch. Women's Multivitamin    [provider]  ondansetron (ZOFRAN) 4 MG tablet Take 1 tablet (4 mg total) by mouth every 6 (six) hours as needed for nausea. Patient not taking: Reported on 09/09/2023 07/13/23   Edmisten, Lyn Hollingshead, PA  Polyethyl Glycol-Propyl Glycol (SYSTANE) 0.4-0.3 % SOLN Place 1-2 drops into both eyes 3 (three) times daily as needed (dry/irritated eyes.).    [provider]  SYNTHROID 88 MCG tablet TAKE 1 TABLET BY MOUTH EVERY DAY MONDAY TO SATURDAY AND OFF ON SUNDAYS Patient taking differently: Take 44-88 mcg by mouth See admin instructions. TAKE 88 mcg BY MOUTH EVERY DAY MONDAY TO SATURDAY AND 44 mg on Sunday in the morning 07/29/22  Dorothyann Peng, MD    Physical Exam: Vitals:   09/23/23 1501 09/23/23 1830 09/23/23 1855 09/23/23 1922  BP: (!) 135/57 132/67  122/62  Pulse: 79 76  77  Resp: 19 18  16   Temp: 98.7 F (37.1 C)  98.8 F (37.1 C)   TempSrc: Oral  Oral   SpO2: 100% 95%  96%  Height: 4' 10.5" (1.486 m)      Constitutional: NAD, calm, comfortable Respiratory: clear to auscultation bilaterally, no wheezing, no crackles. Normal respiratory effort. No accessory muscle use.  Cardiovascular: Regular rate and rhythm, no murmurs / rubs / gallops. No extremity edema. 2+ pedal pulses. No carotid bruits.  Abdomen: no tenderness, no masses palpated. No hepatosplenomegaly. Bowel sounds positive.  MSK: some bruising around R knee joint, minimal swelling compared to L. Neurologic: CN 2-12 grossly intact. Sensation intact, DTR normal. Strength 5/5 in all 4.  Psychiatric: Normal judgment and insight. Alert and oriented x 3. Normal mood.   Data Reviewed:    Labs on Admission: I have personally reviewed following labs and imaging studies  CBC: Recent Labs  Lab 09/23/23 1606  WBC 10.7*  NEUTROABS 9.1*  HGB 6.2*  HCT 20.1*  MCV 100.5*  PLT 381   Basic Metabolic Panel: Recent Labs  Lab 09/23/23 1606   NA 137  K 4.6  CL 104  CO2 23  GLUCOSE 105*  BUN 23  CREATININE 1.32*  CALCIUM 9.1   GFR: Estimated Creatinine Clearance: 29.6 mL/min (A) (by C-G formula based on SCr of 1.32 mg/dL (H)). Liver Function Tests: Recent Labs  Lab 09/23/23 1606  AST 33  ALT 30  ALKPHOS 66  BILITOT 2.4*  PROT 7.0  ALBUMIN 3.7   No results for input(s): "LIPASE", "AMYLASE" in the last 168 hours. No results for input(s): "AMMONIA" in the last 168 hours. Coagulation Profile: Recent Labs  Lab 09/23/23 1800  INR 1.9*   Cardiac Enzymes: No results for input(s): "CKTOTAL", "CKMB", "CKMBINDEX", "TROPONINI" in the last 168 hours. BNP (last 3 results) No results for input(s): "PROBNP" in the last 8760 hours. HbA1C: No results for input(s): "HGBA1C" in the last 72 hours. CBG: No results for input(s): "GLUCAP" in the last 168 hours. Lipid Profile: No results for input(s): "CHOL", "HDL", "LDLCALC", "TRIG", "CHOLHDL", "LDLDIRECT" in the last 72 hours. Thyroid Function Tests: No results for input(s): "TSH", "T4TOTAL", "FREET4", "T3FREE", "THYROIDAB" in the last 72 hours. Anemia Panel: No results for input(s): "VITAMINB12", "FOLATE", "FERRITIN", "TIBC", "IRON", "RETICCTPCT" in the last 72 hours. Urine analysis:    Component Value Date/Time   COLORURINE YELLOW 09/23/2023 1606   APPEARANCEUR HAZY (A) 09/23/2023 1606   LABSPEC 1.014 09/23/2023 1606   PHURINE 5.0 09/23/2023 1606   GLUCOSEU >=500 (A) 09/23/2023 1606   HGBUR NEGATIVE 09/23/2023 1606   BILIRUBINUR NEGATIVE 09/23/2023 1606   BILIRUBINUR Negative 01/12/2023 1629   KETONESUR 5 (A) 09/23/2023 1606   PROTEINUR 30 (A) 09/23/2023 1606   UROBILINOGEN 0.2 01/12/2023 1629   NITRITE NEGATIVE 09/23/2023 1606   LEUKOCYTESUR SMALL (A) 09/23/2023 1606    Radiological Exams on Admission: No results found.  EKG: Independently reviewed.    Assessment and Plan: * GI bleed Manage ABLA with transfusion as above Hold eliquis Clear liquid  diet, NPO after [place] EDP d/w Dr. Loreta Ave who will see in consult. Empiric PPI  ABLA (acute blood loss anemia) Anemia believed to be ABLA due to GIB given positive hemoccult, eliquis use. DDx includes ABLA due to bleeding into / around knee  joint following procedure about 10 days ago, swelling improved at this time however, so probably not a whole lot of ongoing bleeding still. 1u PRBC transfusion Repeat CBC post transfusion Tele monitor T.Bili 2.4, slight elevation that's new for patient... Check haptoglobin and LDH, just-in-case pt has some sort of undiagnosed AIHA instead... Or this could simply be due to breakdown of blood products around knee joint following manipulation procedure?  Atrial fibrillation (HCC) Hold eliquis in setting of GIB. Cont toprol  History of chronic kidney disease Creat 1.3 today, looks about baseline.  Rheumatoid arthritis involving multiple sites with positive rheumatoid factor (HCC) Cont plaquenil.  Essential hypertension Hold norvasc and irbesartan Cont toprol for a.fib rate control.      Advance Care Planning:   Code Status: Full Code  Consults: EDP d/w Dr. Loreta Ave  Family Communication: Family at bedside  Severity of Illness: The appropriate patient status for this patient is OBSERVATION. Observation status is judged to be reasonable and necessary in order to provide the required intensity of service to ensure the patient's safety. The patient's presenting symptoms, physical exam findings, and initial radiographic and laboratory data in the context of their medical condition is felt to place them at decreased risk for further clinical deterioration. Furthermore, it is anticipated that the patient will be medically stable for discharge from the hospital within 2 midnights of admission.   Author: Hillary Bow., DO 09/23/2023 7:42 PM  For on call review [email or web address].

## 2023-09-23 NOTE — Assessment & Plan Note (Signed)
 Cont plaquenil

## 2023-09-24 ENCOUNTER — Ambulatory Visit: Payer: Medicare HMO

## 2023-09-24 DIAGNOSIS — I10 Essential (primary) hypertension: Secondary | ICD-10-CM | POA: Diagnosis not present

## 2023-09-24 DIAGNOSIS — Z87448 Personal history of other diseases of urinary system: Secondary | ICD-10-CM | POA: Diagnosis not present

## 2023-09-24 DIAGNOSIS — M0579 Rheumatoid arthritis with rheumatoid factor of multiple sites without organ or systems involvement: Secondary | ICD-10-CM | POA: Diagnosis not present

## 2023-09-24 DIAGNOSIS — D62 Acute posthemorrhagic anemia: Secondary | ICD-10-CM | POA: Diagnosis not present

## 2023-09-24 DIAGNOSIS — M3219 Other organ or system involvement in systemic lupus erythematosus: Secondary | ICD-10-CM | POA: Diagnosis not present

## 2023-09-24 DIAGNOSIS — K922 Gastrointestinal hemorrhage, unspecified: Secondary | ICD-10-CM | POA: Diagnosis not present

## 2023-09-24 DIAGNOSIS — I480 Paroxysmal atrial fibrillation: Secondary | ICD-10-CM | POA: Diagnosis not present

## 2023-09-24 LAB — HEMOGLOBIN AND HEMATOCRIT, BLOOD
HCT: 28.5 % — ABNORMAL LOW (ref 36.0–46.0)
Hemoglobin: 9.3 g/dL — ABNORMAL LOW (ref 12.0–15.0)

## 2023-09-24 LAB — COMPREHENSIVE METABOLIC PANEL
ALT: 21 U/L (ref 0–44)
AST: 28 U/L (ref 15–41)
Albumin: 3.1 g/dL — ABNORMAL LOW (ref 3.5–5.0)
Alkaline Phosphatase: 53 U/L (ref 38–126)
Anion gap: 10 (ref 5–15)
BUN: 24 mg/dL — ABNORMAL HIGH (ref 8–23)
CO2: 22 mmol/L (ref 22–32)
Calcium: 8.3 mg/dL — ABNORMAL LOW (ref 8.9–10.3)
Chloride: 104 mmol/L (ref 98–111)
Creatinine, Ser: 1.19 mg/dL — ABNORMAL HIGH (ref 0.44–1.00)
GFR, Estimated: 47 mL/min — ABNORMAL LOW (ref >60)
Glucose, Bld: 69 mg/dL — ABNORMAL LOW (ref 70–99)
Potassium: 4.3 mmol/L (ref 3.5–5.1)
Sodium: 136 mmol/L (ref 135–145)
Total Bilirubin: 1.8 mg/dL — ABNORMAL HIGH (ref <1.2)
Total Protein: 5.9 g/dL — ABNORMAL LOW (ref 6.5–8.1)

## 2023-09-24 LAB — CBC
HCT: 21.9 % — ABNORMAL LOW (ref 36.0–46.0)
Hemoglobin: 7.1 g/dL — ABNORMAL LOW (ref 12.0–15.0)
MCH: 32.1 pg (ref 26.0–34.0)
MCHC: 32.4 g/dL (ref 30.0–36.0)
MCV: 99.1 fL (ref 80.0–100.0)
Platelets: 307 10*3/uL (ref 150–400)
RBC: 2.21 MIL/uL — ABNORMAL LOW (ref 3.87–5.11)
RDW: 15.5 % (ref 11.5–15.5)
WBC: 8.6 10*3/uL (ref 4.0–10.5)
nRBC: 0.2 % (ref 0.0–0.2)

## 2023-09-24 LAB — IRON AND TIBC
Iron: 53 ug/dL (ref 28–170)
Saturation Ratios: 25 % (ref 10.4–31.8)
TIBC: 213 ug/dL — ABNORMAL LOW (ref 250–450)
UIBC: 160 ug/dL

## 2023-09-24 LAB — PREPARE RBC (CROSSMATCH)

## 2023-09-24 LAB — RETICULOCYTES
Immature Retic Fract: 30.4 % — ABNORMAL HIGH (ref 2.3–15.9)
RBC.: 3.07 MIL/uL — ABNORMAL LOW (ref 3.87–5.11)
Retic Count, Absolute: 128.3 10*3/uL (ref 19.0–186.0)
Retic Ct Pct: 4.2 % — ABNORMAL HIGH (ref 0.4–3.1)

## 2023-09-24 LAB — VITAMIN B12: Vitamin B-12: 1301 pg/mL — ABNORMAL HIGH (ref 180–914)

## 2023-09-24 LAB — FOLATE: Folate: 38.6 ng/mL (ref >5.9)

## 2023-09-24 LAB — FERRITIN: Ferritin: 720 ng/mL — ABNORMAL HIGH (ref 11–307)

## 2023-09-24 MED ORDER — ORAL CARE MOUTH RINSE: 15.0000 mL | OROMUCOSAL | Status: DC | PRN

## 2023-09-24 MED ORDER — METOPROLOL TARTRATE 25 MG PO TABS
25.0000 mg | ORAL_TABLET | Freq: Two times a day (BID) | ORAL | Status: DC
  Administered 2023-09-25 – 2023-09-28 (×7): 25 mg via ORAL
  Filled 2023-09-24 (×8): qty 1

## 2023-09-24 MED ORDER — SODIUM CHLORIDE 0.9% IV SOLUTION: Freq: Once | INTRAVENOUS | Status: DC

## 2023-09-24 MED ORDER — METOPROLOL TARTRATE 25 MG PO TABS
25.0000 mg | ORAL_TABLET | Freq: Two times a day (BID) | ORAL | Status: DC
  Administered 2023-09-24: 25 mg via ORAL
  Filled 2023-09-24: qty 1

## 2023-09-24 MED ORDER — INFLUENZA VAC A&B SURF ANT ADJ 0.5 ML IM SUSY
0.5000 mL | PREFILLED_SYRINGE | INTRAMUSCULAR | Status: AC
  Administered 2023-09-25: 0.5 mL via INTRAMUSCULAR
  Filled 2023-09-24: qty 0.5

## 2023-09-24 MED ORDER — DEXTROSE 5 % IV SOLN: INTRAVENOUS | Status: AC

## 2023-09-24 NOTE — H&P (View-Only) (Signed)
PROGRESS NOTE  Hannah [name] LKG:[id number] DOB: 01-Dec-1945   PCP: Dorothyann Peng, MD  Patient is from: Home.  Lives alone.  Uses rolling walker since knee surgery.  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  Hgb 6.2 (was 9.6 about 10 days prior).  Hemoccult positive.  Total bili 2.4.  No report of melena or hematochezia.  She had a closed right knee manipulation with minimal surrounding bruising on 12/16.  Denies NSAID use.  2 units of blood ordered.  GI, Dr. Loreta Ave consulted.  Subjective: Seen and examined earlier this morning.  No major events overnight of this morning.  Feels better.  No complaints.  Denies chest pain, dizziness, shortness of breath, melena or hematochezia.  Objective: Vitals:   09/24/23 0826 09/24/23 0958 09/24/23 1015 09/24/23 1245  BP: 126/66 94/71 119/60 129/63  Pulse: 71 72 72 71  Resp: 18  18   Temp: 99.1 F (37.3 C) 98.9 F (37.2 C) 99 F (37.2 C) 98.7 F (37.1 C)  TempSrc: Oral Oral Oral Oral  SpO2: 100% 100% 99% 100%  Weight:   69.4 kg   Height:   4\' 10"  (1.473 m)     Examination:  GENERAL: No apparent distress.  Nontoxic. HEENT: MMM.  Vision and hearing grossly intact.  NECK: Supple.  No apparent JVD.  RESP:  No IWOB.  Fair aeration bilaterally. CVS:  RRR. Heart sounds normal.  ABD/GI/GU: BS+. Abd soft, NTND.  MSK/EXT:  Moves extremities.  Some right knee swelling with old bruise.  Limited ROM in right knee to less than 90 degree flexion. SKIN: no apparent skin lesion or wound NEURO: Awake, alert and oriented appropriately.  No apparent focal neuro deficit. PSYCH: Calm. Normal affect.   Procedures:  None  Microbiology  summarized: COVID-19, influenza and RSV PCR nonreactive  Assessment and plan: Symptomatic acute blood loss anemia: Suspected GI blood with positive Hemoccult.  Patient is on Eliquis for A-fib.  Denies NSAID use.  Total bili and LDH slightly elevated.  Hgb improved to 7.1 after 1 unit.  Getting second unit Recent Labs    11/05/22 1453 01/01/23 0000 02/08/23 1342 04/28/23 0730 05/26/23 0905 07/05/23 0828 07/13/23 0327 09/13/23 1600 09/23/23 1606 09/24/23 0614  HGB 9.8* 9.7* 9.9* 9.8* 10.1* 10.3* 8.8* 9.4* 6.2* 7.1*  -Monitor H&H.  Transfuse for Hgb <7.0. -Continue holding Eliquis -Continue Protonix 40 mg twice daily -Started on CLD by GI. -Dextrose infusion while NPO -Check anemia panel -Follow haptoglobin  Hyperbilirubinemia: Hemolysis?  LDH is slightly elevated.  Improved. -Continue trending.  Paroxysmal atrial fibrillation: Rate controlled. -Resume home metoprolol 25 mg twice daily -Continue holding Eliquis -Optimize electrolytes   CKD-3A: Stable -Continue monitoring   RA: Stable -Cont plaquenil.   Essential hypertension: Normotensive -Continue holding norvasc and irbesartan -Resume home metoprolol  Recent right TKA: Noted some swelling and old bruising.  Some limited ROM.  Working with therapy. -PT/OT eval -Outpatient follow-up  Obesity Body mass index is 31.98 kg/m. -Encourage lifestyle change to lose weight          DVT prophylaxis:  SCDs Start: 09/23/23 1938  Code Status: Full code Family Communication: None at bedside Level of  care: Progressive Status is: Observation The patient will require care spanning > 2 midnights and should be moved to inpatient because: Symptomatic anemia due to acute blood loss   Final disposition: Likely home Consultants:  Gastroenterology  55 minutes with more than 50% spent in reviewing records, counseling patient/family and coordinating care.   Sch Meds:  Scheduled Meds:  sodium chloride   Intravenous Once    [START ON 09/25/2023] influenza vaccine adjuvanted  0.5 mL Intramuscular Tomorrow-1000   metoprolol tartrate  25 mg Oral BID   pantoprazole (PROTONIX) IV  40 mg Intravenous Q12H   Continuous Infusions:  dextrose 75 mL/hr at 09/24/23 0848   PRN Meds:.acetaminophen **OR** acetaminophen, ondansetron **OR** ondansetron (ZOFRAN) IV  Antimicrobials: Anti-infectives (From admission, onward)    None        I have personally reviewed the following labs and images: CBC: Recent Labs  Lab 09/23/23 1606 09/24/23 0614  WBC 10.7* 8.6  NEUTROABS 9.1*  --   HGB 6.2* 7.1*  HCT 20.1* 21.9*  MCV 100.5* 99.1  PLT 381 307   BMP &GFR Recent Labs  Lab 09/23/23 1606 09/24/23 0614  NA 137 136  K 4.6 4.3  CL 104 104  CO2 23 22  GLUCOSE 105* 69*  BUN 23 24*  CREATININE 1.32* 1.19*  CALCIUM 9.1 8.3*   Estimated Creatinine Clearance: 32.2 mL/min (A) (by C-G formula based on SCr of 1.19 mg/dL (H)). Liver & Pancreas: Recent Labs  Lab 09/23/23 1606 09/24/23 0614  AST 33 28  ALT 30 21  ALKPHOS 66 53  BILITOT 2.4* 1.8*  PROT 7.0 5.9*  ALBUMIN 3.7 3.1*   No results for input(s): "LIPASE", "AMYLASE" in the last 168 hours. No results for input(s): "AMMONIA" in the last 168 hours. Diabetic: No results for input(s): "HGBA1C" in the last 72 hours. No results for input(s): "GLUCAP" in the last 168 hours. Cardiac Enzymes: No results for input(s): "CKTOTAL", "CKMB", "CKMBINDEX", "TROPONINI" in the last 168 hours. No results for input(s): "PROBNP" in the last 8760 hours. Coagulation Profile: Recent Labs  Lab 09/23/23 1800  INR 1.9*   Thyroid Function Tests: No results for input(s): "TSH", "T4TOTAL", "FREET4", "T3FREE", "THYROIDAB" in the last 72 hours. Lipid Profile: No results for input(s): "CHOL", "HDL", "LDLCALC", "TRIG", "CHOLHDL", "LDLDIRECT" in the last 72 hours. Anemia Panel: No results for input(s): "VITAMINB12", "FOLATE", "FERRITIN", "TIBC", "IRON", "RETICCTPCT" in the  last 72 hours. Urine analysis:    Component Value Date/Time   COLORURINE YELLOW 09/23/2023 1606   APPEARANCEUR HAZY (A) 09/23/2023 1606   LABSPEC 1.014 09/23/2023 1606   PHURINE 5.0 09/23/2023 1606   GLUCOSEU >=500 (A) 09/23/2023 1606   HGBUR NEGATIVE 09/23/2023 1606   BILIRUBINUR NEGATIVE 09/23/2023 1606   BILIRUBINUR Negative 01/12/2023 1629   KETONESUR 5 (A) 09/23/2023 1606   PROTEINUR 30 (A) 09/23/2023 1606   UROBILINOGEN 0.2 01/12/2023 1629   NITRITE NEGATIVE 09/23/2023 1606   LEUKOCYTESUR SMALL (A) 09/23/2023 1606   Sepsis Labs: Invalid input(s): "PROCALCITONIN", "LACTICIDVEN"  Microbiology: Recent Results (from the past 240 hours)  Resp panel by RT-PCR (RSV, Flu A&B, Covid) Anterior Nasal Swab     Status: None   Collection Time: 09/23/23  3:42 PM   Specimen: Anterior Nasal Swab  Result Value Ref Range Status   SARS Coronavirus 2 by RT PCR NEGATIVE NEGATIVE Final    Comment: (NOTE) SARS-CoV-2 target nucleic acids are NOT DETECTED.  The SARS-CoV-2 RNA is generally detectable in upper respiratory specimens during the acute phase of infection.  The lowest concentration of SARS-CoV-2 viral copies this assay can detect is 138 copies/mL. A negative result does not preclude SARS-Cov-2 infection and should not be used as the sole basis for treatment or other patient management decisions. A negative result may occur with  improper specimen collection/handling, submission of specimen other than nasopharyngeal swab, presence of viral mutation(s) within the areas targeted by this assay, and inadequate number of viral copies(<138 copies/mL). A negative result must be combined with clinical observations, patient history, and epidemiological information. The expected result is Negative.  Fact Sheet for Patients:  BloggerCourse.com  Fact Sheet for Healthcare Providers:  SeriousBroker.it  This test is no t yet approved or cleared  by the [place] FDA and  has been authorized for detection and/or diagnosis of SARS-CoV-2 by FDA under an Emergency Use Authorization (EUA). This EUA will remain  in effect (meaning this test can be used) for the duration of the COVID-19 declaration under Section 564(b)(1) of the Act, 21 U.S.C.section 360bbb-3(b)(1), unless the authorization is terminated  or revoked sooner.       Influenza A by PCR NEGATIVE NEGATIVE Final   Influenza B by PCR NEGATIVE NEGATIVE Final    Comment: (NOTE) The Xpert Xpress SARS-CoV-2/FLU/RSV plus assay is intended as an aid in the diagnosis of influenza from Nasopharyngeal swab specimens and should not be used as a sole basis for treatment. Nasal washings and aspirates are unacceptable for Xpert Xpress SARS-CoV-2/FLU/RSV testing.  Fact Sheet for Patients: BloggerCourse.com  Fact Sheet for Healthcare Providers: SeriousBroker.it  This test is not yet approved or cleared by the [place] FDA and has been authorized for detection and/or diagnosis of SARS-CoV-2 by FDA under an Emergency Use Authorization (EUA). This EUA will remain in effect (meaning this test can be used) for the duration of the COVID-19 declaration under Section 564(b)(1) of the Act, 21 U.S.C. section 360bbb-3(b)(1), unless the authorization is terminated or revoked.     Resp Syncytial Virus by PCR NEGATIVE NEGATIVE Final    Comment: (NOTE) Fact Sheet for Patients: BloggerCourse.com  Fact Sheet for Healthcare Providers: SeriousBroker.it  This test is not yet approved or cleared by the [place] FDA and has been authorized for detection and/or diagnosis of SARS-CoV-2 by FDA under an Emergency Use Authorization (EUA). This EUA will remain in effect (meaning this test can be used) for the duration of the COVID-19 declaration under Section 564(b)(1) of the Act, 21  U.S.C. section 360bbb-3(b)(1), unless the authorization is terminated or revoked.  Performed at [facility], [address] [place], [place] [address]     Radiology Studies: No results found.    Kashis Penley T. Yasmene Salomone [facility]  If 7PM-7AM, please contact night-coverage [email or web address] 09/24/2023, 12:59 PM

## 2023-09-24 NOTE — ED Notes (Signed)
Received call from Dr. Loreta Ave. Unable to do surgery today d/t hgb <8. Verbal order for 1 unit of blood and recheck H&H when finished.

## 2023-09-24 NOTE — Consult Note (Signed)
Reason for Consult:Severe anemia with guaiac positive stools. Referring Physician: [facility].  Hannah [name] is an 77 y.o. female.  HPI: Hannah [name] is 77 year old female, with multiple medical problems listed below, transported to the ER by EMS, admitted last night for dizziness, chills and weakness. She was noted to have a hemoglobin of 6.2 gms/dl last night with guaiac positive stools. This was noted to be a significant drop from 9.6 gms/dl on 95/28/41. Her hemoglobin after 1 unit of PRBC's today is 7.1 gms/dl. Of note, she had a right knee arthroplasty on 07/12/23 with bruising and a right knee manipulation on 09/13/23. She is on Eliquis for atrial fibrillation. She denies having any melena or hematochezia. She denies having a problem with constipation or diarrhea. She had EGD in 03/17/13 when she was noted to have an antral ulcer and a colonoscopy done on the same day revealed scattered diverticulosis and internal hemorrhoids. There is no known family history of colon cancer T  Past Medical History:  Diagnosis Date   Allergy 25 years ago.   Iron deficiency anemia    Rheumatoid arthritis    Asthma    Atrial fibrillation (HCC)    Cataract Had cataracts surgery 2015 & 2016   Chronic kidney disease    Diabetes mellitus without complication (HCC)    Gout 12/17/2014   patient reported   Heart murmur 4 years ago   Hyperlipidemia    Hypertension    Hypothyroidism    Systemic lupus erythematosus (HCC)    Vitiligo    Past Surgical History:  Procedure Laterality Date   CATARACT EXTRACTION Bilateral 2015   DILATION AND CURETTAGE OF UTERUS     DOPPLER ECHOCARDIOGRAPHY  05/2018   Internist to review with pt; potential heart murmur 06/20/18   EYE SURGERY  Cataract Surgery, 2015 & 2016.   keratosis removal  2021   KNEE CLOSED REDUCTION Left 07/06/2022   Procedure: CLOSED MANIPULATION KNEE;  Surgeon: Ollen Gross, MD;  Location: [facility];  Service: Orthopedics;  Laterality:  Left;   KNEE CLOSED REDUCTION Right 09/13/2023   Procedure: CLOSED MANIPULATION KNEE;  Surgeon: Ollen Gross, MD;  Location: [facility];  Service: Orthopedics;  Laterality: Right;   REPLACEMENT TOTAL KNEE Left    SKIN SURGERY  11/30/2018   left side of face   TOOTH EXTRACTION     TOTAL KNEE ARTHROPLASTY Left 02/02/2022   Procedure: TOTAL KNEE ARTHROPLASTY;  Surgeon: Ollen Gross, MD;  Location: [facility];  Service: Orthopedics;  Laterality: Left;   TOTAL KNEE ARTHROPLASTY Right 07/12/2023   Procedure: RIGHT TOTAL KNEE ARTHROPLASTY;  Surgeon: Ollen Gross, MD;  Location: [facility];  Service: Orthopedics;  Laterality: Right;   Family History  Problem Relation Age of Onset   Heart disease Father    Diabetes Father    Hypertension Mother    Obesity Mother    Varicose Veins Mother    Hypertension Sister    Hypertension Sister    Leukemia Sister    Cancer Sister    Obesity Sister    Social History:  reports that she has never smoked. She has never been exposed to tobacco smoke. She has never used smokeless tobacco. She reports that she does not currently use alcohol. She reports that she does not use drugs.  Allergies:  Allergies  Allergen Reactions   Shellfish Allergy Anaphylaxis and Hives   Medications: I have reviewed the patient's current medications. Prior to Admission: (Not in a hospital admission)  Scheduled:  pantoprazole (PROTONIX) IV  40 mg Intravenous Q12H   Continuous: UEA:[id number] **OR** acetaminophen, ondansetron **OR** ondansetron (ZOFRAN) IV  Results for orders placed or performed during the hospital encounter of 09/23/23 (from the past 48 hours)  Resp panel by RT-PCR (RSV, Flu A&B, Covid) Anterior Nasal Swab     Status: None   Collection Time: 09/23/23  3:42 PM   Specimen: Anterior Nasal Swab  Result Value Ref Range   SARS Coronavirus 2 by RT PCR NEGATIVE NEGATIVE    Comment: (NOTE) SARS-CoV-2 target nucleic acids are NOT DETECTED.  The SARS-CoV-2 RNA is  generally detectable in upper respiratory specimens during the acute phase of infection. The lowest concentration of SARS-CoV-2 viral copies this assay can detect is 138 copies/mL. A negative result does not preclude SARS-Cov-2 infection and should not be used as the sole basis for treatment or other patient management decisions. A negative result may occur with  improper specimen collection/handling, submission of specimen other than nasopharyngeal swab, presence of viral mutation(s) within the areas targeted by this assay, and inadequate number of viral copies(<138 copies/mL). A negative result must be combined with clinical observations, patient history, and epidemiological information. The expected result is Negative.  Fact Sheet for Patients:  BloggerCourse.com  Fact Sheet for Healthcare Providers:  SeriousBroker.it  This test is no t yet approved or cleared by the [place] FDA and  has been authorized for detection and/or diagnosis of SARS-CoV-2 by FDA under an Emergency Use Authorization (EUA). This EUA will remain  in effect (meaning this test can be used) for the duration of the COVID-19 declaration under Section 564(b)(1) of the Act, 21 U.S.C.section 360bbb-3(b)(1), unless the authorization is terminated  or revoked sooner.       Influenza A by PCR NEGATIVE NEGATIVE   Influenza B by PCR NEGATIVE NEGATIVE    Comment: (NOTE) The Xpert Xpress SARS-CoV-2/FLU/RSV plus assay is intended as an aid in the diagnosis of influenza from Nasopharyngeal swab specimens and should not be used as a sole basis for treatment. Nasal washings and aspirates are unacceptable for Xpert Xpress SARS-CoV-2/FLU/RSV testing.  Fact Sheet for Patients: BloggerCourse.com  Fact Sheet for Healthcare Providers: SeriousBroker.it  This test is not yet approved or cleared by the [place] FDA  and has been authorized for detection and/or diagnosis of SARS-CoV-2 by FDA under an Emergency Use Authorization (EUA). This EUA will remain in effect (meaning this test can be used) for the duration of the COVID-19 declaration under Section 564(b)(1) of the Act, 21 U.S.C. section 360bbb-3(b)(1), unless the authorization is terminated or revoked.     Resp Syncytial Virus by PCR NEGATIVE NEGATIVE    Comment: (NOTE) Fact Sheet for Patients: BloggerCourse.com  Fact Sheet for Healthcare Providers: SeriousBroker.it  This test is not yet approved or cleared by the [place] FDA and has been authorized for detection and/or diagnosis of SARS-CoV-2 by FDA under an Emergency Use Authorization (EUA). This EUA will remain in effect (meaning this test can be used) for the duration of the COVID-19 declaration under Section 564(b)(1) of the Act, 21 U.S.C. section 360bbb-3(b)(1), unless the authorization is terminated or revoked.  Performed at [facility], [address] [place], [place] [address]   Comprehensive metabolic panel     Status: Abnormal   Collection Time: 09/23/23  4:06 PM  Result Value Ref Range   Sodium 137 135 - 145 mmol/L   Potassium 4.6 3.5 - 5.1 mmol/L   Chloride 104 98 - 111 mmol/L  CO2 23 22 - 32 mmol/L   Glucose, Bld 105 (H) 70 - 99 mg/dL    Comment: Glucose reference range applies only to samples taken after fasting for at least 8 hours.   BUN 23 8 - 23 mg/dL   Creatinine, Ser 3.66 (H) 0.44 - 1.00 mg/dL   Calcium 9.1 8.9 - 44.0 mg/dL   Total Protein 7.0 6.5 - 8.1 g/dL   Albumin 3.7 3.5 - 5.0 g/dL   AST 33 15 - 41 U/L   ALT 30 0 - 44 U/L   Alkaline Phosphatase 66 38 - 126 U/L   Total Bilirubin 2.4 (H) <1.2 mg/dL   GFR, Estimated 41 (L) >60 mL/min    Comment: (NOTE) Calculated using the CKD-EPI Creatinine Equation (2021)    Anion gap 10 5 - 15    Comment: Performed at [facility], [address] [place], [place] [address]  CBC with Differential     Status: Abnormal   Collection Time: 09/23/23  4:06 PM  Result Value Ref Range   WBC 10.7 (H) 4.0 - 10.5 K/uL   RBC 2.00 (L) 3.87 - 5.11 MIL/uL   Hemoglobin 6.2 (LL) 12.0 - 15.0 g/dL    Comment: This critical result has verified and been called to [facility] H., RN by Pattricia Boss on 12 26 2024 at 1656, and has been read back.    HCT 20.1 (L) 36.0 - 46.0 %   MCV 100.5 (H) 80.0 - 100.0 fL   MCH 31.0 26.0 - 34.0 pg   MCHC 30.8 30.0 - 36.0 g/dL   RDW 59.5 63.8 - 75.6 %   Platelets 381 150 - 400 K/uL   nRBC 0.2 0.0 - 0.2 %   Neutrophils Relative % 84 %   Neutro Abs 9.1 (H) 1.7 - 7.7 K/uL   Lymphocytes Relative 6 %   Lymphs Abs 0.6 (L) 0.7 - 4.0 K/uL   Monocytes Relative 7 %   Monocytes Absolute 0.7 0.1 - 1.0 K/uL   Eosinophils Relative 1 %   Eosinophils Absolute 0.1 0.0 - 0.5 K/uL   Basophils Relative 1 %   Basophils Absolute 0.1 0.0 - 0.1 K/uL   Immature Granulocytes 1 %   Abs Immature Granulocytes 0.12 (H) 0.00 - 0.07 K/uL    Comment: Performed at [facility], [address], [place], [place] [address]  Urinalysis, w/ Reflex to Culture (Infection Suspected) -Urine, Clean Catch     Status: Abnormal   Collection Time: 09/23/23  4:06 PM  Result Value Ref Range   Specimen Source URINE, CLEAN CATCH    Color, Urine YELLOW YELLOW   APPearance HAZY (A) CLEAR   Specific Gravity, Urine 1.014 1.005 - 1.030   pH 5.0 5.0 - 8.0   Glucose, UA >=500 (A) NEGATIVE mg/dL   Hgb urine dipstick NEGATIVE NEGATIVE   Bilirubin Urine NEGATIVE NEGATIVE   Ketones, ur 5 (A) NEGATIVE mg/dL   Protein, ur 30 (A) NEGATIVE mg/dL   Nitrite NEGATIVE NEGATIVE   Leukocytes,Ua SMALL (A) NEGATIVE   RBC / HPF 0-5 0 - 5 RBC/hpf   WBC, UA 11-20 0 - 5 WBC/hpf    Comment:        Reflex urine culture not performed if WBC <=10, OR if Squamous epithelial cells >5. If Squamous epithelial cells >5 suggest  recollection.    Bacteria, UA NONE SEEN NONE SEEN   Squamous Epithelial / HPF 6-10 0 - 5 /HPF    Comment: Performed  at [facility], [address], [place], [place] [address]  ABO/Rh     Status: None   Collection Time: 09/23/23  4:06 PM  Result Value Ref Range   ABO/RH(D)      A POS Performed at [facility], [address] [place], [place] [address]   Type and screen [facility]     Status: None (Preliminary result)   Collection Time: 09/23/23  6:00 PM  Result Value Ref Range   ABO/RH(D) A POS    Antibody Screen NEG    Sample Expiration 09/26/2023,2359    Unit Number [id number]    Blood Component Type RED CELLS,LR    Unit division 00    Status of Unit ISSUED    Transfusion Status OK TO TRANSFUSE    Crossmatch Result      Compatible Performed at [facility], [address], [place], [place] [address]   Protime-INR     Status: Abnormal   Collection Time: 09/23/23  6:00 PM  Result Value Ref Range   Prothrombin Time 22.3 (H) 11.4 - 15.2 seconds   INR 1.9 (H) 0.8 - 1.2    Comment: (NOTE) INR goal varies based on device and disease states. Performed at [facility], [address] [place], [place] [address]   POC occult blood, ED     Status: Abnormal   Collection Time: 09/23/23  6:20 PM  Result Value Ref Range   Fecal Occult Bld POSITIVE (A) NEGATIVE  Prepare RBC (crossmatch)     Status: None   Collection Time: 09/23/23  7:00 PM  Result Value Ref Range   Order Confirmation      ORDER PROCESSED BY BLOOD BANK Performed at [facility], [address] [place], [place] [address]   Lactate dehydrogenase     Status: Abnormal   Collection Time: 09/23/23  7:50 PM  Result Value Ref Range   LDH 274 (H) 98 - 192 U/L    Comment: Performed at [facility], [address], [place], [place] [address]  CBC     Status: Abnormal   Collection Time:  09/24/23  6:14 AM  Result Value Ref Range   WBC 8.6 4.0 - 10.5 K/uL   RBC 2.21 (L) 3.87 - 5.11 MIL/uL   Hemoglobin 7.1 (L) 12.0 - 15.0 g/dL   HCT 06.3 (L) 01.6 - 01.0 %   MCV 99.1 80.0 - 100.0 fL   MCH 32.1 26.0 - 34.0 pg   MCHC 32.4 30.0 - 36.0 g/dL   RDW 93.2 35.5 - 73.2 %   Platelets 307 150 - 400 K/uL   nRBC 0.2 0.0 - 0.2 %    Comment: Performed at [facility], [address], [place], [place] [address]  Comprehensive metabolic panel     Status: Abnormal   Collection Time: 09/24/23  6:14 AM  Result Value Ref Range   Sodium 136 135 - 145 mmol/L   Potassium 4.3 3.5 - 5.1 mmol/L   Chloride 104 98 - 111 mmol/L   CO2 22 22 - 32 mmol/L   Glucose, Bld 69 (L) 70 - 99 mg/dL    Comment: Glucose reference range applies only to samples taken after fasting for at least 8 hours.   BUN 24 (H) 8 - 23 mg/dL   Creatinine, Ser 2.70 (H) 0.44 - 1.00 mg/dL   Calcium 8.3 (L) 8.9 - 10.3 mg/dL   Total Protein 5.9 (  L) 6.5 - 8.1 g/dL   Albumin 3.1 (L) 3.5 - 5.0 g/dL   AST 28 15 - 41 U/L   ALT 21 0 - 44 U/L   Alkaline Phosphatase 53 38 - 126 U/L   Total Bilirubin 1.8 (H) <1.2 mg/dL   GFR, Estimated 47 (L) >60 mL/min    Comment: (NOTE) Calculated using the CKD-EPI Creatinine Equation (2021)    Anion gap 10 5 - 15    Comment: Performed at [facility], [address], [place], [place] [address]   *Note: Due to a large number of results and/or encounters for the requested time period, some results have not been displayed. A complete set of results can be found in Results Review.    No results found.  Review of Systems  Constitutional:  Positive for activity change, chills and fatigue. Negative for appetite change, diaphoresis, fever and unexpected weight change.  HENT: Negative.    Eyes: Negative.   Respiratory:  Negative for shortness of breath.   Cardiovascular:  Positive for palpitations.  Gastrointestinal:  Positive for blood in stool.  Endocrine:  Positive for cold intolerance.  Genitourinary: Negative.   Musculoskeletal:  Positive for arthralgias and myalgias.  Skin:  Positive for color change and pallor. Negative for rash and wound.  Neurological:  Positive for dizziness, tremors, weakness and light-headedness. Negative for seizures, syncope, facial asymmetry, speech difficulty, numbness and headaches.  Hematological: Negative.   Psychiatric/Behavioral: Negative.     Blood pressure (!) 115/47, pulse 65, temperature 99.1 F (37.3 C), temperature source Oral, resp. rate 20, height 4' 10.5" (1.486 m), SpO2 95%. Physical Exam Constitutional:      General: She is not in acute distress.    Appearance: Normal appearance. She is not toxic-appearing.  HENT:     Head: Normocephalic and atraumatic.     Comments: Multiple moles noted on the face    Mouth/Throat:     Mouth: Mucous membranes are moist.  Pulmonary:     Breath sounds: Normal breath sounds.  Abdominal:     General: There is no distension.     Palpations: Abdomen is soft.     Tenderness: There is no abdominal tenderness. There is no guarding.  Musculoskeletal:     Cervical back: Normal range of motion and neck supple.     Comments: Surgical scars noted on the anterior aspect of both knees with bruising noted bilaterally  Skin:    General: Skin is warm and dry.  Neurological:     Mental Status: She is alert and oriented to person, place, and time.  Psychiatric:        Mood and Affect: Mood normal.        Behavior: Behavior normal.        Thought Content: Thought content normal.        Judgment: Judgment normal.    Assessment/Plan: 1) Acute blood loss anemia [with an acute drop in hemoglobin by 3 gms] and guaiac positive stools-history of IDA- and PUS IN 2014, complicated by recent right knee arthroplasty in October 2024 and a recent knee manipulation-INR is 1.9. Will plan a EGD over the weekend after she gets another unit of PRBC's. 2) Hyperbilirubinemia-probably due  to hemolysis. 3) CKD.Creatinine 1.32 today. 4) Atrial fibrillation on Eliquis which is now on hold. 5) Rheumatoid arthritis.  6) HTN/Hyperlipdemia. 7) S/P right knee atrthroplasty on 07/12/23 and a recent knee manupulation on 09/13/23. 8) Hypothyroidism. 9) Systemic lupus erythematosus.  Dr. Tiajuana Amass with [facility] will be covering over the weekend. [name] 09/24/2023, 7:33 AM

## 2023-09-24 NOTE — Progress Notes (Signed)
PROGRESS NOTE  Hannah [name] LKG:[id number] DOB: 01-Dec-1945   PCP: Dorothyann Peng, MD  Patient is from: Home.  Lives alone.  Uses rolling walker since knee surgery.  DOA: 09/23/2023 LOS: 0  Chief complaints Chief Complaint  Patient presents with   Chills          Brief Narrative / Interim history: 76 year old F with PMH of A-fib on Eliquis, IDA, heart murmur, bilateral knee arthritis s/p recent right TKA presenting with dizziness, chills and palor, and admitted for symptomatic anemia likely due to GI blood loss.  Hgb 6.2 (was 9.6 about 10 days prior).  Hemoccult positive.  Total bili 2.4.  No report of melena or hematochezia.  She had a closed right knee manipulation with minimal surrounding bruising on 12/16.  Denies NSAID use.  2 units of blood ordered.  GI, Dr. Loreta Ave consulted.  Subjective: Seen and examined earlier this morning.  No major events overnight of this morning.  Feels better.  No complaints.  Denies chest pain, dizziness, shortness of breath, melena or hematochezia.  Objective: Vitals:   09/24/23 0826 09/24/23 0958 09/24/23 1015 09/24/23 1245  BP: 126/66 94/71 119/60 129/63  Pulse: 71 72 72 71  Resp: 18  18   Temp: 99.1 F (37.3 C) 98.9 F (37.2 C) 99 F (37.2 C) 98.7 F (37.1 C)  TempSrc: Oral Oral Oral Oral  SpO2: 100% 100% 99% 100%  Weight:   69.4 kg   Height:   4\' 10"  (1.473 m)     Examination:  GENERAL: No apparent distress.  Nontoxic. HEENT: MMM.  Vision and hearing grossly intact.  NECK: Supple.  No apparent JVD.  RESP:  No IWOB.  Fair aeration bilaterally. CVS:  RRR. Heart sounds normal.  ABD/GI/GU: BS+. Abd soft, NTND.  MSK/EXT:  Moves extremities.  Some right knee swelling with old bruise.  Limited ROM in right knee to less than 90 degree flexion. SKIN: no apparent skin lesion or wound NEURO: Awake, alert and oriented appropriately.  No apparent focal neuro deficit. PSYCH: Calm. Normal affect.   Procedures:  None  Microbiology  summarized: COVID-19, influenza and RSV PCR nonreactive  Assessment and plan: Symptomatic acute blood loss anemia: Suspected GI blood with positive Hemoccult.  Patient is on Eliquis for A-fib.  Denies NSAID use.  Total bili and LDH slightly elevated.  Hgb improved to 7.1 after 1 unit.  Getting second unit Recent Labs    11/05/22 1453 01/01/23 0000 02/08/23 1342 04/28/23 0730 05/26/23 0905 07/05/23 0828 07/13/23 0327 09/13/23 1600 09/23/23 1606 09/24/23 0614  HGB 9.8* 9.7* 9.9* 9.8* 10.1* 10.3* 8.8* 9.4* 6.2* 7.1*  -Monitor H&H.  Transfuse for Hgb <7.0. -Continue holding Eliquis -Continue Protonix 40 mg twice daily -Started on CLD by GI. -Dextrose infusion while NPO -Check anemia panel -Follow haptoglobin  Hyperbilirubinemia: Hemolysis?  LDH is slightly elevated.  Improved. -Continue trending.  Paroxysmal atrial fibrillation: Rate controlled. -Resume home metoprolol 25 mg twice daily -Continue holding Eliquis -Optimize electrolytes   CKD-3A: Stable -Continue monitoring   RA: Stable -Cont plaquenil.   Essential hypertension: Normotensive -Continue holding norvasc and irbesartan -Resume home metoprolol  Recent right TKA: Noted some swelling and old bruising.  Some limited ROM.  Working with therapy. -PT/OT eval -Outpatient follow-up  Obesity Body mass index is 31.98 kg/m. -Encourage lifestyle change to lose weight          DVT prophylaxis:  SCDs Start: 09/23/23 1938  Code Status: Full code Family Communication: None at bedside Level of  care: Progressive Status is: Observation The patient will require care spanning > 2 midnights and should be moved to inpatient because: Symptomatic anemia due to acute blood loss   Final disposition: Likely home Consultants:  Gastroenterology  55 minutes with more than 50% spent in reviewing records, counseling patient/family and coordinating care.   Sch Meds:  Scheduled Meds:  sodium chloride   Intravenous Once    [START ON 09/25/2023] influenza vaccine adjuvanted  0.5 mL Intramuscular Tomorrow-1000   metoprolol tartrate  25 mg Oral BID   pantoprazole (PROTONIX) IV  40 mg Intravenous Q12H   Continuous Infusions:  dextrose 75 mL/hr at 09/24/23 0848   PRN Meds:.acetaminophen **OR** acetaminophen, ondansetron **OR** ondansetron (ZOFRAN) IV  Antimicrobials: Anti-infectives (From admission, onward)    None        I have personally reviewed the following labs and images: CBC: Recent Labs  Lab 09/23/23 1606 09/24/23 0614  WBC 10.7* 8.6  NEUTROABS 9.1*  --   HGB 6.2* 7.1*  HCT 20.1* 21.9*  MCV 100.5* 99.1  PLT 381 307   BMP &GFR Recent Labs  Lab 09/23/23 1606 09/24/23 0614  NA 137 136  K 4.6 4.3  CL 104 104  CO2 23 22  GLUCOSE 105* 69*  BUN 23 24*  CREATININE 1.32* 1.19*  CALCIUM 9.1 8.3*   Estimated Creatinine Clearance: 32.2 mL/min (A) (by C-G formula based on SCr of 1.19 mg/dL (H)). Liver & Pancreas: Recent Labs  Lab 09/23/23 1606 09/24/23 0614  AST 33 28  ALT 30 21  ALKPHOS 66 53  BILITOT 2.4* 1.8*  PROT 7.0 5.9*  ALBUMIN 3.7 3.1*   No results for input(s): "LIPASE", "AMYLASE" in the last 168 hours. No results for input(s): "AMMONIA" in the last 168 hours. Diabetic: No results for input(s): "HGBA1C" in the last 72 hours. No results for input(s): "GLUCAP" in the last 168 hours. Cardiac Enzymes: No results for input(s): "CKTOTAL", "CKMB", "CKMBINDEX", "TROPONINI" in the last 168 hours. No results for input(s): "PROBNP" in the last 8760 hours. Coagulation Profile: Recent Labs  Lab 09/23/23 1800  INR 1.9*   Thyroid Function Tests: No results for input(s): "TSH", "T4TOTAL", "FREET4", "T3FREE", "THYROIDAB" in the last 72 hours. Lipid Profile: No results for input(s): "CHOL", "HDL", "LDLCALC", "TRIG", "CHOLHDL", "LDLDIRECT" in the last 72 hours. Anemia Panel: No results for input(s): "VITAMINB12", "FOLATE", "FERRITIN", "TIBC", "IRON", "RETICCTPCT" in the  last 72 hours. Urine analysis:    Component Value Date/Time   COLORURINE YELLOW 09/23/2023 1606   APPEARANCEUR HAZY (A) 09/23/2023 1606   LABSPEC 1.014 09/23/2023 1606   PHURINE 5.0 09/23/2023 1606   GLUCOSEU >=500 (A) 09/23/2023 1606   HGBUR NEGATIVE 09/23/2023 1606   BILIRUBINUR NEGATIVE 09/23/2023 1606   BILIRUBINUR Negative 01/12/2023 1629   KETONESUR 5 (A) 09/23/2023 1606   PROTEINUR 30 (A) 09/23/2023 1606   UROBILINOGEN 0.2 01/12/2023 1629   NITRITE NEGATIVE 09/23/2023 1606   LEUKOCYTESUR SMALL (A) 09/23/2023 1606   Sepsis Labs: Invalid input(s): "PROCALCITONIN", "LACTICIDVEN"  Microbiology: Recent Results (from the past 240 hours)  Resp panel by RT-PCR (RSV, Flu A&B, Covid) Anterior Nasal Swab     Status: None   Collection Time: 09/23/23  3:42 PM   Specimen: Anterior Nasal Swab  Result Value Ref Range Status   SARS Coronavirus 2 by RT PCR NEGATIVE NEGATIVE Final    Comment: (NOTE) SARS-CoV-2 target nucleic acids are NOT DETECTED.  The SARS-CoV-2 RNA is generally detectable in upper respiratory specimens during the acute phase of infection.  The lowest concentration of SARS-CoV-2 viral copies this assay can detect is 138 copies/mL. A negative result does not preclude SARS-Cov-2 infection and should not be used as the sole basis for treatment or other patient management decisions. A negative result may occur with  improper specimen collection/handling, submission of specimen other than nasopharyngeal swab, presence of viral mutation(s) within the areas targeted by this assay, and inadequate number of viral copies(<138 copies/mL). A negative result must be combined with clinical observations, patient history, and epidemiological information. The expected result is Negative.  Fact Sheet for Patients:  BloggerCourse.com  Fact Sheet for Healthcare Providers:  SeriousBroker.it  This test is no t yet approved or cleared  by the [place] FDA and  has been authorized for detection and/or diagnosis of SARS-CoV-2 by FDA under an Emergency Use Authorization (EUA). This EUA will remain  in effect (meaning this test can be used) for the duration of the COVID-19 declaration under Section 564(b)(1) of the Act, 21 U.S.C.section 360bbb-3(b)(1), unless the authorization is terminated  or revoked sooner.       Influenza A by PCR NEGATIVE NEGATIVE Final   Influenza B by PCR NEGATIVE NEGATIVE Final    Comment: (NOTE) The Xpert Xpress SARS-CoV-2/FLU/RSV plus assay is intended as an aid in the diagnosis of influenza from Nasopharyngeal swab specimens and should not be used as a sole basis for treatment. Nasal washings and aspirates are unacceptable for Xpert Xpress SARS-CoV-2/FLU/RSV testing.  Fact Sheet for Patients: BloggerCourse.com  Fact Sheet for Healthcare Providers: SeriousBroker.it  This test is not yet approved or cleared by the [place] FDA and has been authorized for detection and/or diagnosis of SARS-CoV-2 by FDA under an Emergency Use Authorization (EUA). This EUA will remain in effect (meaning this test can be used) for the duration of the COVID-19 declaration under Section 564(b)(1) of the Act, 21 U.S.C. section 360bbb-3(b)(1), unless the authorization is terminated or revoked.     Resp Syncytial Virus by PCR NEGATIVE NEGATIVE Final    Comment: (NOTE) Fact Sheet for Patients: BloggerCourse.com  Fact Sheet for Healthcare Providers: SeriousBroker.it  This test is not yet approved or cleared by the [place] FDA and has been authorized for detection and/or diagnosis of SARS-CoV-2 by FDA under an Emergency Use Authorization (EUA). This EUA will remain in effect (meaning this test can be used) for the duration of the COVID-19 declaration under Section 564(b)(1) of the Act, 21  U.S.C. section 360bbb-3(b)(1), unless the authorization is terminated or revoked.  Performed at [facility], [address] [place], [place] [address]     Radiology Studies: No results found.    Kashis Penley T. Yasmene Salomone [facility]  If 7PM-7AM, please contact night-coverage [email or web address] 09/24/2023, 12:59 PM

## 2023-09-25 DIAGNOSIS — K2950 Unspecified chronic gastritis without bleeding: Secondary | ICD-10-CM | POA: Diagnosis not present

## 2023-09-25 DIAGNOSIS — Z7901 Long term (current) use of anticoagulants: Secondary | ICD-10-CM

## 2023-09-25 DIAGNOSIS — M3219 Other organ or system involvement in systemic lupus erythematosus: Secondary | ICD-10-CM | POA: Diagnosis not present

## 2023-09-25 DIAGNOSIS — D62 Acute posthemorrhagic anemia: Secondary | ICD-10-CM | POA: Diagnosis not present

## 2023-09-25 DIAGNOSIS — K449 Diaphragmatic hernia without obstruction or gangrene: Secondary | ICD-10-CM | POA: Diagnosis not present

## 2023-09-25 DIAGNOSIS — I10 Essential (primary) hypertension: Secondary | ICD-10-CM | POA: Diagnosis not present

## 2023-09-25 DIAGNOSIS — R195 Other fecal abnormalities: Secondary | ICD-10-CM | POA: Diagnosis not present

## 2023-09-25 DIAGNOSIS — Z87448 Personal history of other diseases of urinary system: Secondary | ICD-10-CM | POA: Diagnosis not present

## 2023-09-25 DIAGNOSIS — D509 Iron deficiency anemia, unspecified: Secondary | ICD-10-CM

## 2023-09-25 DIAGNOSIS — M0579 Rheumatoid arthritis with rheumatoid factor of multiple sites without organ or systems involvement: Secondary | ICD-10-CM | POA: Diagnosis not present

## 2023-09-25 DIAGNOSIS — I4891 Unspecified atrial fibrillation: Secondary | ICD-10-CM | POA: Diagnosis not present

## 2023-09-25 DIAGNOSIS — I480 Paroxysmal atrial fibrillation: Secondary | ICD-10-CM | POA: Diagnosis not present

## 2023-09-25 DIAGNOSIS — K922 Gastrointestinal hemorrhage, unspecified: Secondary | ICD-10-CM | POA: Diagnosis not present

## 2023-09-25 DIAGNOSIS — K269 Duodenal ulcer, unspecified as acute or chronic, without hemorrhage or perforation: Secondary | ICD-10-CM | POA: Diagnosis not present

## 2023-09-25 DIAGNOSIS — K31A11 Gastric intestinal metaplasia without dysplasia, involving the antrum: Secondary | ICD-10-CM | POA: Diagnosis not present

## 2023-09-25 DIAGNOSIS — K222 Esophageal obstruction: Secondary | ICD-10-CM | POA: Diagnosis not present

## 2023-09-25 DIAGNOSIS — Z8711 Personal history of peptic ulcer disease: Secondary | ICD-10-CM

## 2023-09-25 LAB — CBC
HCT: 27.7 % — ABNORMAL LOW (ref 36.0–46.0)
Hemoglobin: 9.1 g/dL — ABNORMAL LOW (ref 12.0–15.0)
MCH: 31.2 pg (ref 26.0–34.0)
MCHC: 32.9 g/dL (ref 30.0–36.0)
MCV: 94.9 fL (ref 80.0–100.0)
Platelets: 321 10*3/uL (ref 150–400)
RBC: 2.92 MIL/uL — ABNORMAL LOW (ref 3.87–5.11)
RDW: 16.7 % — ABNORMAL HIGH (ref 11.5–15.5)
WBC: 7.1 10*3/uL (ref 4.0–10.5)
nRBC: 0 % (ref 0.0–0.2)

## 2023-09-25 LAB — RENAL FUNCTION PANEL
Albumin: 3.1 g/dL — ABNORMAL LOW (ref 3.5–5.0)
Anion gap: 9 (ref 5–15)
BUN: 15 mg/dL (ref 8–23)
CO2: 21 mmol/L — ABNORMAL LOW (ref 22–32)
Calcium: 8.1 mg/dL — ABNORMAL LOW (ref 8.9–10.3)
Chloride: 105 mmol/L (ref 98–111)
Creatinine, Ser: 1.07 mg/dL — ABNORMAL HIGH (ref 0.44–1.00)
GFR, Estimated: 53 mL/min — ABNORMAL LOW (ref >60)
Glucose, Bld: 98 mg/dL (ref 70–99)
Phosphorus: 2.6 mg/dL (ref 2.5–4.6)
Potassium: 3.9 mmol/L (ref 3.5–5.1)
Sodium: 135 mmol/L (ref 135–145)

## 2023-09-25 LAB — HAPTOGLOBIN: Haptoglobin: 230 mg/dL (ref 42–346)

## 2023-09-25 LAB — HEMOGLOBIN AND HEMATOCRIT, BLOOD
HCT: 26.5 % — ABNORMAL LOW (ref 36.0–46.0)
Hemoglobin: 8.7 g/dL — ABNORMAL LOW (ref 12.0–15.0)

## 2023-09-25 LAB — MAGNESIUM: Magnesium: 2.2 mg/dL (ref 1.7–2.4)

## 2023-09-25 NOTE — Evaluation (Signed)
Physical Therapy Evaluation Patient Details Name: Hannah [name] MRN: [id number] DOB: 02-16-1946 Today's Date: 09/25/2023  History of Present Illness  77 year old F with PMH of A-fib on Eliquis, IDA, heart murmur, bilateral knee arthritis s/p recent right TKA presenting with dizziness, chills and palor, and admitted for symptomatic anemia likely due to GI blood loss. Recent Right knee manipulation on 09/13/23 and currently receiving outpatient PT services.  Clinical Impression  On eval, pt was Supv-Mod Ind with mobility. She walked ~ 100 feet with use of a RW. Pt was fatigued towards end of distance. Had pt perform 10 reps each of QSs, SAQs, SLRs, LAQs, seated knee flexion-exercises appeared effortful for pt. Ice to knee at end of session. Recommend daily ambulation with nursing and/or mobility team. Will plan to follow during hospital stay. Recommend pt resume OP PT once able.       If plan is discharge home, recommend the following: Assist for transportation   Can travel by private vehicle        Equipment Recommendations None recommended by PT  Recommendations for Other Services       Functional Status Assessment Patient has had a recent decline in their functional status and demonstrates the ability to make significant improvements in function in a reasonable and predictable amount of time.     Precautions / Restrictions Precautions Precautions: Fall Restrictions Weight Bearing Restrictions Per Provider Order: No      Mobility  Bed Mobility               General bed mobility comments: oob in recliner    Transfers Overall transfer level: Modified independent Equipment used: Rolling walker (2 wheels)                    Ambulation/Gait Ambulation/Gait assistance: Supervision Gait Distance (Feet): 100 Feet Assistive device: Rolling walker (2 wheels) Gait Pattern/deviations: Knee flexed in stance - right       General Gait Details: Cues for safety  and for pt to try to make heel contact instead of staying on forefoot. Fatigued towards end of distance.  Stairs            Wheelchair Mobility     Tilt Bed    Modified Rankin (Stroke Patients Only)       Balance Overall balance assessment: Mild deficits observed, not formally tested                                           Pertinent Vitals/Pain Pain Assessment Pain Assessment: Faces Faces Pain Scale: Hurts little more Pain Location: Right knee with mobility Pain Descriptors / Indicators: Discomfort, Grimacing Pain Intervention(s): Limited activity within patient's tolerance, Monitored during session, Ice applied, Repositioned    Home Living Family/patient expects to be discharged to:: Private residence Living Arrangements: Alone Available Help at Discharge: Family;Available PRN/intermittently Type of Home: Apartment Home Access: Level entry       Home Layout: One level Home Equipment: Cane - single point;Grab bars - tub/shower;BSC/3in1;Rolling Walker (2 wheels)      Prior Function Prior Level of Function : Independent/Modified Independent             Mobility Comments: Mod I with cane ADLs Comments: Pt reports that she has not been able to get into the shower since her knee surgery. BSC does not fit.     Extremity/Trunk Assessment  Upper Extremity Assessment Upper Extremity Assessment: Defer to OT evaluation    Lower Extremity Assessment Lower Extremity Assessment: Defer to PT evaluation;RLE deficits/detail RLE Deficits / Details: mild bruising and swelling noted due to recent manipulation.    Cervical / Trunk Assessment Cervical / Trunk Assessment: Other exceptions Cervical / Trunk Exceptions: body habitus  Communication   Communication Communication: No apparent difficulties  Cognition Arousal: Alert Behavior During Therapy: WFL for tasks assessed/performed Overall Cognitive Status: Within Functional Limits for tasks  assessed                                          General Comments      Exercises Total Joint Exercises Quad Sets: AROM, Right, 10 reps Short Arc Quad: AROM, AAROM, Right, 10 reps, Seated Straight Leg Raises: AROM, Right, 10 reps, Supine Long Arc Quad: AROM, Right, 10 reps, Seated Knee Flexion: AROM, Right, 10 reps, Seated Goniometric ROM: ~10-85 degrees   Assessment/Plan    PT Assessment Patient needs continued PT services  PT Problem List Decreased strength;Decreased range of motion;Decreased activity tolerance;Decreased balance;Decreased mobility;Decreased knowledge of use of DME;Pain       PT Treatment Interventions DME instruction;Gait training;Functional mobility training;Therapeutic activities;Therapeutic exercise;Patient/family education;Balance training    PT Goals (Current goals can be found in the Care Plan section)  Acute Rehab PT Goals Patient Stated Goal: to stay on tract with rehab PT Goal Formulation: With patient Time For Goal Achievement: 10/09/23 Potential to Achieve Goals: Good    Frequency Min 1X/week     Co-evaluation               AM-PAC PT "6 Clicks" Mobility  Outcome Measure Help needed turning from your back to your side while in a flat bed without using bedrails?: None Help needed moving from lying on your back to sitting on the side of a flat bed without using bedrails?: None Help needed moving to and from a bed to a chair (including a wheelchair)?: None Help needed standing up from a chair using your arms (e.g., wheelchair or bedside chair)?: None Help needed to walk in hospital room?: A Little Help needed climbing 3-5 steps with a railing? : A Little 6 Click Score: 22    End of Session Equipment Utilized During Treatment: Gait belt Activity Tolerance: Patient tolerated treatment well Patient left: in chair;with call bell/phone within reach   PT Visit Diagnosis: Other abnormalities of gait and mobility (R26.89)     Time: 1610-9604 PT Time Calculation (min) (ACUTE ONLY): 26 min   Charges:   PT Evaluation $PT Eval Low Complexity: 1 Low PT Treatments $Gait Training: 8-22 mins PT General Charges $$ ACUTE PT VISIT: 1 Visit           Faye Ramsay, PT Acute Rehabilitation  Office: [phone]

## 2023-09-25 NOTE — Care Management Obs Status (Signed)
MEDICARE OBSERVATION STATUS NOTIFICATION   Patient Details  Name: Hannah [name] MRN: [id number] Date of Birth: 12/26/1945   Medicare Observation Status Notification Given:  Yes    Adrian Prows, RN 09/25/2023, 1:27 PM

## 2023-09-25 NOTE — Evaluation (Signed)
Occupational Therapy Evaluation/Discharge Patient Details Name: Hannah [name] MRN: [id number] DOB: 16-Oct-1945 Today's Date: 09/25/2023   History of Present Illness 77 year old F with PMH of A-fib on Eliquis, IDA, heart murmur, bilateral knee arthritis s/p recent right TKA presenting with dizziness, chills and palor, and admitted for symptomatic anemia likely due to GI blood loss.  Hgb 6.2 (was 9.6 about 10 days prior). Recent Right knee manipulation on 09/13/23 and currently receiving outpatient PT services.   Clinical Impression   Patient evaluated by Occupational Therapy with no further acute OT needs identified. All education has been completed and the patient has no further questions. Pt reports being unable to get into tub/shower to bathe since recent knee surgery. Pt would benefit from shower chair to increase functional performance during bathing and tub/shower transfers. No follow-up Occupational Therapy needs. Resume outpatient PT services for post-op knee manipulation. OT is signing off. Thank you for this referral.        If plan is discharge home, recommend the following: A little help with bathing/dressing/bathroom;Assist for transportation    Functional Status Assessment  Patient has had a recent decline in their functional status and demonstrates the ability to make significant improvements in function in a reasonable and predictable amount of time.  Equipment Recommendations  Tub/shower seat       Precautions / Restrictions Precautions Precautions: None Restrictions Weight Bearing Restrictions Per Provider Order: No      Mobility Bed Mobility Overal bed mobility: Independent   Transfers Overall transfer level: Modified independent Equipment used: Rolling walker (2 wheels)      General transfer comment: RW height adjusted d/t to patient's height.      Balance Overall balance assessment: Mild deficits observed, not formally tested      ADL either  performed or assessed with clinical judgement   ADL     General ADL Comments: More difficulty with movement and mobility of Right knee requiring Min A as needed for LB bathing/dressing. Mod I for UB ADL tasks, grooming, and toileting. SBA provided for toilet transfer with RW.     Vision Baseline Vision/History: 0 No visual deficits Ability to See in Adequate Light: 0 Adequate Patient Visual Report: No change from baseline Vision Assessment?: No apparent visual deficits     Perception Perception: Not tested       Praxis Praxis: Not tested       Pertinent Vitals/Pain Pain Assessment Pain Assessment: Faces Faces Pain Scale: Hurts little more Pain Location: Right knee with mobility Pain Descriptors / Indicators: Discomfort Pain Intervention(s): Monitored during session, Repositioned, Ice applied     Extremity/Trunk Assessment Upper Extremity Assessment Upper Extremity Assessment: Overall WFL for tasks assessed   Lower Extremity Assessment Lower Extremity Assessment: Defer to PT evaluation;RLE deficits/detail RLE Deficits / Details: mild bruising and swelling noted due to recent manipulation.   Cervical / Trunk Assessment Cervical / Trunk Assessment: Other exceptions Cervical / Trunk Exceptions: body habitus   Communication Communication Communication: No apparent difficulties   Cognition Arousal: Alert Behavior During Therapy: WFL for tasks assessed/performed Overall Cognitive Status: Within Functional Limits for tasks assessed         Exercises Other Exercises Other Exercises: Re-print of pt's HEP provided in outpatient for Right knee. Verbal instructions provided on each exercise to review with pt verbalizing understanding. Provide gait belt to perform assisted knee flexion stretch.        Home Living Family/patient expects to be discharged to:: Private residence Living Arrangements: Alone Available Help at  Discharge: Family;Available PRN/intermittently (sister  lives close) Type of Home: Apartment Home Access: Level entry     Home Layout: One level     Bathroom Shower/Tub: Tub/shower unit         Home Equipment: Cane - single point;Grab bars - tub/shower;BSC/3in1   Additional Comments: BSC placed over toilet      Prior Functioning/Environment Prior Level of Function : Independent/Modified Independent    Mobility Comments: Mod I with cane ADLs Comments: Pt reports that she has not been able to get into the shower since her knee surgery. BSC does not fit.        OT Problem List: Impaired balance (sitting and/or standing)         OT Goals(Current goals can be found in the care plan section) Acute Rehab OT Goals OT Goal Formulation: All assessment and education complete, DC therapy  OT Frequency:  1X visit       AM-PAC OT "6 Clicks" Daily Activity     Outcome Measure Help from another person eating meals?: None Help from another person taking care of personal grooming?: None Help from another person toileting, which includes using toliet, bedpan, or urinal?: None Help from another person bathing (including washing, rinsing, drying)?: A Little Help from another person to put on and taking off regular upper body clothing?: None Help from another person to put on and taking off regular lower body clothing?: A Little 6 Click Score: 22   End of Session Equipment Utilized During Treatment: Rolling walker (2 wheels)  Activity Tolerance: Patient tolerated treatment well Patient left: in chair;with call bell/phone within reach  OT Visit Diagnosis: Muscle weakness (generalized) (M62.81);Unsteadiness on feet (R26.81)                Time: 1191-4782 OT Time Calculation (min): 24 min Charges:  OT General Charges $OT Visit: 1 Visit OT Evaluation $OT Eval Moderate Complexity: 1 Mod OT Treatments $Therapeutic Activity: 8-22 mins  Limmie Patricia, OTR/L,CBIS  Supplemental OT - [facility] and [facility] Secure Chat Preferred    Raeqwon Lux,  Charisse March 09/25/2023, 10:02 AM

## 2023-09-25 NOTE — Progress Notes (Addendum)
  GI Progress Note Covering for Drs. Mann & Hung   Assessment    Acute blood loss anemia, history of IDA, Hemoccult positive stool, history of gastric ulcer. R/O ulcer AVM Afib on Eliquis SLE RA CKD HTN   Recommendations   Trend CBC Continue pantoprazole twice daily Continue to hold Eliquis EGD Sunday at 0730 pending anesthesia availability   Chief Complaint   Brown bowel movements daily. No GI complaints.   Vital signs in last 24 hours: Temp:  [98.7 F (37.1 C)-99.6 F (37.6 C)] 99.6 F (37.6 C) (12/28 0240) Pulse Rate:  [65-72] 70 (12/28 0240) Resp:  [18-20] 20 (12/28 0240) BP: (94-137)/(53-81) 117/58 (12/28 0240) SpO2:  [97 %-100 %] 98 % (12/28 0240) Weight:  [69.4 kg] 69.4 kg (12/27 1015) Last BM Date : 09/24/23  General: Alert, well-developed, in NAD Heart:  Regular rate and rhythm; no murmurs Chest: Clear to ascultation bilaterally Abdomen:  Soft, nontender and nondistended. Normal bowel sounds, without guarding, and without rebound.   Extremities:  Bruising, swelling R knee. Neurologic:  Alert and  oriented x4; grossly normal neurologically. Psych:  Alert and cooperative. Normal mood and affect.  Intake/Output from previous day: 12/27 0701 - 12/28 0700 In: 2948.3 [P.O.:800; I.V.:1730.8; Blood:417.5] Out: -  Intake/Output this shift: No intake/output data recorded.  Lab Results: Recent Labs    09/23/23 1606 09/24/23 0614 09/24/23 1524 09/24/23 2332 09/25/23 0636  WBC 10.7* 8.6  --   --  7.1  HGB 6.2* 7.1* 9.3* 8.7* 9.1*  HCT 20.1* 21.9* 28.5* 26.5* 27.7*  PLT 381 307  --   --  321   BMET Recent Labs    09/23/23 1606 09/24/23 0614 09/25/23 0636  NA 137 136 135  K 4.6 4.3 3.9  CL 104 104 105  CO2 23 22 21*  GLUCOSE 105* 69* 98  BUN 23 24* 15  CREATININE 1.32* 1.19* 1.07*  CALCIUM 9.1 8.3* 8.1*   LFT Recent Labs    09/24/23 0614 09/25/23 0636  PROT 5.9*  --   ALBUMIN 3.1* 3.1*  AST 28  --   ALT 21  --   ALKPHOS 53  --    BILITOT 1.8*  --    PT/INR Recent Labs    09/23/23 1800  LABPROT 22.3*  INR 1.9*     LOS: 0 days   Hannah Sappington T. Russella Dar, MD 09/25/2023, 9:29 AM See Loretha Stapler, [facility], to contact our on call provider

## 2023-09-25 NOTE — TOC Initial Note (Signed)
Transition of Care [facility]) - Initial/Assessment Note    Patient Details  Name: Hannah [name] MRN: [id number] Date of Birth: Apr 13, 1946  Transition of Care [facility]) CM/SW Contact:    Adrian Prows, RN Phone Number: 09/25/2023, 1:43 PM  Clinical Narrative:                 TOC for d/c planning; spoke w/ pt in room; pt says she lives at home; she plans to return at d/c; pt identified POC sister Adraine Isais ([phone]); pt verified insurance/PCP; pt says her sister will transport her home; she denies SDOH risks; pt says she has a walker, and BSC; she currently goes to [facility] since she had knee surgery in October; pt says she does not have HH services or home oxygen; also explained MOON to pt; she verbalized understanding, and signed document; awaiting PT eval; TOC will follow.  Expected Discharge Plan: Home/Self Care Barriers to Discharge: Continued Medical Work up   Patient Goals and CMS Choice Patient states their goals for this hospitalization and ongoing recovery are:: home CMS Medicare.gov Compare Post Acute Care list provided to:: Patient   [place] ownership interest in [facility].provided to:: Patient    Expected Discharge Plan and Services   Discharge Planning Services: CM Consult   Living arrangements for the past 2 months: Apartment                                      Prior Living Arrangements/Services Living arrangements for the past 2 months: Apartment Lives with:: Self Patient language and need for interpreter reviewed:: Yes Do you feel safe going back to the place where you live?: Yes      Need for Family Participation in Patient Care: Yes (Comment) Care giver support system in place?: Yes (comment) Current home services: DME (walker, BSC) Criminal Activity/Legal Involvement Pertinent to Current Situation/Hospitalization: No - Comment as needed  Activities of Daily Living   ADL Screening (condition at time of  admission) Independently performs ADLs?: Yes (appropriate for developmental age) Is the patient deaf or have difficulty hearing?: No Does the patient have difficulty seeing, even when wearing glasses/contacts?: No Does the patient have difficulty concentrating, remembering, or making decisions?: No  Permission Sought/Granted   Permission granted to share information with : Yes, Verbal Permission Granted  Share Information with NAME: Case Manager     Permission granted to share info w Relationship: Waynesha Besch (sister) [phone]     Emotional Assessment Appearance:: Appears stated age Attitude/Demeanor/Rapport: Gracious Affect (typically observed): Accepting Orientation: : Oriented to Self, Oriented to Place, Oriented to  Time, Oriented to Situation Alcohol / Substance Use: Not Applicable Psych Involvement: No (comment)  Admission diagnosis:  GI bleed [K92.2] Gastrointestinal hemorrhage, unspecified gastrointestinal hemorrhage type [K92.2] Anemia, unspecified type [D64.9] Patient Active Problem List   Diagnosis Date Noted   ABLA (acute blood loss anemia) 09/23/2023   GI bleed 09/23/2023   Pure hypercholesterolemia 07/18/2023   Personal history of COVID-19 07/18/2023   Primary osteoarthritis of right knee 07/12/2023   Acquired thrombophilia (HCC) 01/12/2023   Arthrofibrosis of knee joint, right 07/06/2022   OA (osteoarthritis) of knee 02/02/2022   Primary osteoarthritis of left knee 02/02/2022   Chronic bilateral low back pain without sciatica 06/25/2021   Paresthesia and pain of extremity 06/25/2021   Atrial fibrillation (HCC) 04/16/2021   Demand ischemia (HCC) 03/26/2021   Unspecified atrial fibrillation (  HCC) 03/26/2021   COVID-19 virus infection 03/26/2021   PVC (premature ventricular contraction) 05/06/2020   Type 2 diabetes mellitus with stage 3 chronic kidney disease, without long-term current use of insulin (HCC) 03/20/2020   Palpitations 03/20/2020    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   Primary hypothyroidism 03/20/2020   Vitiligo 03/20/2020   Class 2 severe obesity due to excess calories with serious comorbidity and body mass index (BMI) of 35.0 to 35.9 in adult (HCC) 10/31/2019   Iron deficiency anemia 08/12/2017   Osteoporosis 10/02/2016   Systemic lupus erythematosus (HCC) 10/02/2016   Rheumatoid arthritis involving multiple sites with positive rheumatoid factor (HCC) 10/02/2016   High risk medication use 10/02/2016   History of chronic kidney disease 10/02/2016   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/2016   Primary osteoarthritis of both knees 10/02/2016   Vitamin D deficiency 10/02/2016   History of diabetes mellitus 10/02/2016   History of hypertension 10/02/2016   History of asthma 10/02/2016   Absolute anemia    MGUS (monoclonal gammopathy of unknown significance)    Normocytic anemia 03/21/2015   Essential hypertension 03/20/2015   Abnormal CT of the chest 07/30/2012   Asthma 06/19/2012   PCP:  Dorothyann Peng, MD Pharmacy:   [id number] - [place], [place] - [address] AT [facility] OF [address] & [facility] [address] [place] [address] Phone: [phone] Fax: [phone]  [facility] - [place], [place] - [address] [place] [address] Phone: [phone] Fax: [phone]     Social Drivers of Health (SDOH) Social History: SDOH Screenings   Food Insecurity: No Food Insecurity (09/25/2023)  Housing: Low Risk  (09/25/2023)  Transportation Needs: No Transportation Needs (09/25/2023)  Utilities: Not At Risk (09/25/2023)  Alcohol Screen: Low Risk  (06/29/2023)  Depression (PHQ2-9): Medium Risk (06/30/2023)  Financial Resource Strain: Low Risk  (06/29/2023)  Physical Activity: Sufficiently Active (06/29/2023)  Social Connections: Moderately Integrated (06/29/2023)  Stress: No Stress Concern Present (06/29/2023)  Tobacco Use: Low Risk   (09/23/2023)  Health Literacy: Adequate Health Literacy (06/30/2023)   SDOH Interventions: Food Insecurity Interventions: Intervention Not Indicated, Inpatient TOC Housing Interventions: Intervention Not Indicated, Inpatient TOC Transportation Interventions: Intervention Not Indicated, Inpatient TOC Utilities Interventions: Intervention Not Indicated, Inpatient TOC   Readmission Risk Interventions     No data to display

## 2023-09-25 NOTE — H&P (View-Only) (Signed)
°  GI Progress Note Covering for Drs. Mann & Hung   Assessment    Acute blood loss anemia, history of IDA, Hemoccult positive stool, history of gastric ulcer. R/O ulcer AVM Afib on Eliquis SLE RA CKD HTN   Recommendations   Trend CBC Continue pantoprazole twice daily Continue to hold Eliquis EGD Sunday at 0730 pending anesthesia availability   Chief Complaint   Brown bowel movements daily. No GI complaints.   Vital signs in last 24 hours: Temp:  [98.7 F (37.1 C)-99.6 F (37.6 C)] 99.6 F (37.6 C) (12/28 0240) Pulse Rate:  [65-72] 70 (12/28 0240) Resp:  [18-20] 20 (12/28 0240) BP: (94-137)/(53-81) 117/58 (12/28 0240) SpO2:  [97 %-100 %] 98 % (12/28 0240) Weight:  [69.4 kg] 69.4 kg (12/27 1015) Last BM Date : 09/24/23  General: Alert, well-developed, in NAD Heart:  Regular rate and rhythm; no murmurs Chest: Clear to ascultation bilaterally Abdomen:  Soft, nontender and nondistended. Normal bowel sounds, without guarding, and without rebound.   Extremities:  Bruising, swelling R knee. Neurologic:  Alert and  oriented x4; grossly normal neurologically. Psych:  Alert and cooperative. Normal mood and affect.  Intake/Output from previous day: 12/27 0701 - 12/28 0700 In: 2948.3 [P.O.:800; I.V.:1730.8; Blood:417.5] Out: -  Intake/Output this shift: No intake/output data recorded.  Lab Results: Recent Labs    09/23/23 1606 09/24/23 0614 09/24/23 1524 09/24/23 2332 09/25/23 0636  WBC 10.7* 8.6  --   --  7.1  HGB 6.2* 7.1* 9.3* 8.7* 9.1*  HCT 20.1* 21.9* 28.5* 26.5* 27.7*  PLT 381 307  --   --  321   BMET Recent Labs    09/23/23 1606 09/24/23 0614 09/25/23 0636  NA 137 136 135  K 4.6 4.3 3.9  CL 104 104 105  CO2 23 22 21*  GLUCOSE 105* 69* 98  BUN 23 24* 15  CREATININE 1.32* 1.19* 1.07*  CALCIUM 9.1 8.3* 8.1*   LFT Recent Labs    09/24/23 0614 09/25/23 0636  PROT 5.9*  --   ALBUMIN 3.1* 3.1*  AST 28  --   ALT 21  --   ALKPHOS 53  --    BILITOT 1.8*  --    PT/INR Recent Labs    09/23/23 1800  LABPROT 22.3*  INR 1.9*     LOS: 0 days   Hannah Sappington T. Russella Dar, MD 09/25/2023, 9:29 AM See Hannah [name], [facility], to contact our on call provider

## 2023-09-25 NOTE — Progress Notes (Signed)
PROGRESS NOTE  Hannah [name] QIO:[id number] DOB: 06-11-1946   PCP: Dorothyann Peng, MD  Patient is from: Home.  Lives alone.  Uses rolling walker since knee surgery.  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  Hgb 6.2 (was 9.6 about 10 days prior).  Hemoccult positive.  Total bili 2.4.  No report of melena or hematochezia.  She had a closed right knee manipulation with minimal surrounding bruising on 12/16.  Denies NSAID use.  GI consulted.  Transfused 2 units with appropriate response.  Plan for EGD on 12/29.    Subjective: Seen and examined earlier this morning.  No major events overnight of this morning.  No complaints.  No further bowel movements.  Objective: Vitals:   09/24/23 2146 09/25/23 0240 09/25/23 1010 09/25/23 1119  BP: (!) 104/53 (!) 117/58 112/68 112/68  Pulse: 68 70 67 67  Resp:  20 18   Temp: 99.3 F (37.4 C) 99.6 F (37.6 C) 98.6 F (37 C)   TempSrc: Oral Oral Oral   SpO2: 97% 98% 99%   Weight:      Height:        Examination:  GENERAL: No apparent distress.  Nontoxic. HEENT: MMM.  Vision and hearing grossly intact.  NECK: Supple.  No apparent JVD.  RESP:  No IWOB.  Fair aeration bilaterally. CVS:  RRR. Heart sounds normal.  ABD/GI/GU: BS+. Abd soft, NTND.  MSK/EXT:  Moves extremities.  Some right knee swelling with old bruise.  During ROM exercise. SKIN: Bruising over right knee as above. NEURO: Awake, alert and oriented appropriately.  No apparent focal neuro deficit. PSYCH: Calm. Normal affect.   Procedures:  None  Microbiology summarized: COVID-19, influenza and RSV PCR nonreactive  Assessment and plan: Symptomatic acute  blood loss anemia: Suspected GI blood with positive Hemoccult.  Patient is on Eliquis for A-fib.  Denies NSAID use.  Total bili and LDH slightly elevated but normal haptoglobin.  Transfused 2 units with appropriate response.  No nutritional deficiency on anemia panel.   Recent Labs    04/28/23 0730 05/26/23 0905 07/05/23 0828 07/13/23 0327 09/13/23 1600 09/23/23 1606 09/24/23 0614 09/24/23 1524 09/24/23 2332 09/25/23 0636  HGB 9.8* 10.1* 10.3* 8.8* 9.4* 6.2* 7.1* 9.3* 8.7* 9.1*  -Monitor H&H.  Transfuse for Hgb <7.0. -Continue holding Eliquis -Continue Protonix 40 mg twice daily -Regular diet and n.p.o. after midnight per GI.  Hyperbilirubinemia: Hemolysis?  LDH is slightly elevated but normal haptoglobin.  Improved. -Continue trending.  Paroxysmal atrial fibrillation: Rate controlled. -Continue home metoprolol 25 mg twice daily -Continue holding Eliquis -Optimize electrolytes   CKD-3A: Stable -Continue monitoring   RA: Stable -Cont plaquenil.   Essential hypertension: Normotensive -Continue holding norvasc and irbesartan -Resume home metoprolol  Recent right TKA: Noted some swelling and old bruising.  Some limited ROM.  Working with therapy. -PT/OT and ROM exercise -Outpatient follow-up  Obesity Body mass index is 31.98 kg/m. -Encourage lifestyle change to lose weight          DVT prophylaxis:  SCDs Start: 09/23/23 1938  Code Status: Full code Family Communication: None at bedside Level of care: Telemetry Status is: Observation The patient will require  care spanning > 2 midnights and should be moved to inpatient because: Symptomatic anemia due to acute blood loss   Final disposition: Likely home Consultants:  Gastroenterology  35 minutes with more than 50% spent in reviewing records, counseling patient/family and coordinating care.   Sch Meds:  Scheduled Meds:  sodium chloride   Intravenous Once   metoprolol tartrate  25 mg Oral Q12H    pantoprazole (PROTONIX) IV  40 mg Intravenous Q12H   Continuous Infusions:   PRN Meds:.acetaminophen **OR** acetaminophen, ondansetron **OR** ondansetron (ZOFRAN) IV, mouth rinse  Antimicrobials: Anti-infectives (From admission, onward)    None        I have personally reviewed the following labs and images: CBC: Recent Labs  Lab 09/23/23 1606 09/24/23 0614 09/24/23 1524 09/24/23 2332 09/25/23 0636  WBC 10.7* 8.6  --   --  7.1  NEUTROABS 9.1*  --   --   --   --   HGB 6.2* 7.1* 9.3* 8.7* 9.1*  HCT 20.1* 21.9* 28.5* 26.5* 27.7*  MCV 100.5* 99.1  --   --  94.9  PLT 381 307  --   --  321   BMP &GFR Recent Labs  Lab 09/23/23 1606 09/24/23 0614 09/25/23 0636  NA 137 136 135  K 4.6 4.3 3.9  CL 104 104 105  CO2 23 22 21*  GLUCOSE 105* 69* 98  BUN 23 24* 15  CREATININE 1.32* 1.19* 1.07*  CALCIUM 9.1 8.3* 8.1*  MG  --   --  2.2  PHOS  --   --  2.6   Estimated Creatinine Clearance: 35.8 mL/min (A) (by C-G formula based on SCr of 1.07 mg/dL (H)). Liver & Pancreas: Recent Labs  Lab 09/23/23 1606 09/24/23 0614 09/25/23 0636  AST 33 28  --   ALT 30 21  --   ALKPHOS 66 53  --   BILITOT 2.4* 1.8*  --   PROT 7.0 5.9*  --   ALBUMIN 3.7 3.1* 3.1*   No results for input(s): "LIPASE", "AMYLASE" in the last 168 hours. No results for input(s): "AMMONIA" in the last 168 hours. Diabetic: No results for input(s): "HGBA1C" in the last 72 hours. No results for input(s): "GLUCAP" in the last 168 hours. Cardiac Enzymes: No results for input(s): "CKTOTAL", "CKMB", "CKMBINDEX", "TROPONINI" in the last 168 hours. No results for input(s): "PROBNP" in the last 8760 hours. Coagulation Profile: Recent Labs  Lab 09/23/23 1800  INR 1.9*   Thyroid Function Tests: No results for input(s): "TSH", "T4TOTAL", "FREET4", "T3FREE", "THYROIDAB" in the last 72 hours. Lipid Profile: No results for input(s): "CHOL", "HDL", "LDLCALC", "TRIG", "CHOLHDL", "LDLDIRECT" in the last 72  hours. Anemia Panel: Recent Labs    09/24/23 1524  VITAMINB12 1,301*  FOLATE 38.6  FERRITIN 720*  TIBC 213*  IRON 53  RETICCTPCT 4.2*   Urine analysis:    Component Value Date/Time   COLORURINE YELLOW 09/23/2023 1606   APPEARANCEUR HAZY (A) 09/23/2023 1606   LABSPEC 1.014 09/23/2023 1606   PHURINE 5.0 09/23/2023 1606   GLUCOSEU >=500 (A) 09/23/2023 1606   HGBUR NEGATIVE 09/23/2023 1606   BILIRUBINUR NEGATIVE 09/23/2023 1606   BILIRUBINUR Negative 01/12/2023 1629   KETONESUR 5 (A) 09/23/2023 1606   PROTEINUR 30 (A) 09/23/2023 1606   UROBILINOGEN 0.2 01/12/2023 1629   NITRITE NEGATIVE 09/23/2023 1606   LEUKOCYTESUR SMALL (A) 09/23/2023 1606   Sepsis Labs: Invalid input(s): "PROCALCITONIN", "LACTICIDVEN"  Microbiology: Recent Results (from the past 240 hours)  Resp panel by RT-PCR (  RSV, Flu A&B, Covid) Anterior Nasal Swab     Status: None   Collection Time: 09/23/23  3:42 PM   Specimen: Anterior Nasal Swab  Result Value Ref Range Status   SARS Coronavirus 2 by RT PCR NEGATIVE NEGATIVE Final    Comment: (NOTE) SARS-CoV-2 target nucleic acids are NOT DETECTED.  The SARS-CoV-2 RNA is generally detectable in upper respiratory specimens during the acute phase of infection. The lowest concentration of SARS-CoV-2 viral copies this assay can detect is 138 copies/mL. A negative result does not preclude SARS-Cov-2 infection and should not be used as the sole basis for treatment or other patient management decisions. A negative result may occur with  improper specimen collection/handling, submission of specimen other than nasopharyngeal swab, presence of viral mutation(s) within the areas targeted by this assay, and inadequate number of viral copies(<138 copies/mL). A negative result must be combined with clinical observations, patient history, and epidemiological information. The expected result is Negative.  Fact Sheet for Patients:   BloggerCourse.com  Fact Sheet for Healthcare Providers:  SeriousBroker.it  This test is no t yet approved or cleared by the [place] FDA and  has been authorized for detection and/or diagnosis of SARS-CoV-2 by FDA under an Emergency Use Authorization (EUA). This EUA will remain  in effect (meaning this test can be used) for the duration of the COVID-19 declaration under Section 564(b)(1) of the Act, 21 U.S.C.section 360bbb-3(b)(1), unless the authorization is terminated  or revoked sooner.       Influenza A by PCR NEGATIVE NEGATIVE Final   Influenza B by PCR NEGATIVE NEGATIVE Final    Comment: (NOTE) The Xpert Xpress SARS-CoV-2/FLU/RSV plus assay is intended as an aid in the diagnosis of influenza from Nasopharyngeal swab specimens and should not be used as a sole basis for treatment. Nasal washings and aspirates are unacceptable for Xpert Xpress SARS-CoV-2/FLU/RSV testing.  Fact Sheet for Patients: BloggerCourse.com  Fact Sheet for Healthcare Providers: SeriousBroker.it  This test is not yet approved or cleared by the [place] FDA and has been authorized for detection and/or diagnosis of SARS-CoV-2 by FDA under an Emergency Use Authorization (EUA). This EUA will remain in effect (meaning this test can be used) for the duration of the COVID-19 declaration under Section 564(b)(1) of the Act, 21 U.S.C. section 360bbb-3(b)(1), unless the authorization is terminated or revoked.     Resp Syncytial Virus by PCR NEGATIVE NEGATIVE Final    Comment: (NOTE) Fact Sheet for Patients: BloggerCourse.com  Fact Sheet for Healthcare Providers: SeriousBroker.it  This test is not yet approved or cleared by the [place] FDA and has been authorized for detection and/or diagnosis of SARS-CoV-2 by FDA under an Emergency Use  Authorization (EUA). This EUA will remain in effect (meaning this test can be used) for the duration of the COVID-19 declaration under Section 564(b)(1) of the Act, 21 U.S.C. section 360bbb-3(b)(1), unless the authorization is terminated or revoked.  Performed at [facility], [address], [place], [place] [address]     Radiology Studies: No results found.    Gagandeep Pettet T. Deryn Massengale [facility]  If 7PM-7AM, please contact night-coverage [email or web address] 09/25/2023, 1:43 PM

## 2023-09-26 ENCOUNTER — Observation Stay (HOSPITAL_COMMUNITY): Payer: Self-pay | Admitting: Anesthesiology

## 2023-09-26 ENCOUNTER — Encounter (HOSPITAL_COMMUNITY)
Admission: EM | Disposition: A | Discharge status: Home / Self Care | Payer: Self-pay | Source: Home / Self Care | Attending: Emergency Medicine

## 2023-09-26 ENCOUNTER — Observation Stay (HOSPITAL_BASED_OUTPATIENT_CLINIC_OR_DEPARTMENT_OTHER): Payer: Medicare HMO | Admitting: Anesthesiology

## 2023-09-26 ENCOUNTER — Encounter (HOSPITAL_COMMUNITY): Payer: Self-pay | Admitting: Internal Medicine

## 2023-09-26 DIAGNOSIS — Z87448 Personal history of other diseases of urinary system: Secondary | ICD-10-CM | POA: Diagnosis not present

## 2023-09-26 DIAGNOSIS — M0579 Rheumatoid arthritis with rheumatoid factor of multiple sites without organ or systems involvement: Secondary | ICD-10-CM | POA: Diagnosis not present

## 2023-09-26 DIAGNOSIS — R195 Other fecal abnormalities: Secondary | ICD-10-CM | POA: Diagnosis not present

## 2023-09-26 DIAGNOSIS — K31A19 Gastric intestinal metaplasia without dysplasia, unspecified site: Secondary | ICD-10-CM | POA: Diagnosis not present

## 2023-09-26 DIAGNOSIS — K2950 Unspecified chronic gastritis without bleeding: Secondary | ICD-10-CM | POA: Diagnosis not present

## 2023-09-26 DIAGNOSIS — K31A11 Gastric intestinal metaplasia without dysplasia, involving the antrum: Secondary | ICD-10-CM

## 2023-09-26 DIAGNOSIS — K449 Diaphragmatic hernia without obstruction or gangrene: Secondary | ICD-10-CM

## 2023-09-26 DIAGNOSIS — K222 Esophageal obstruction: Secondary | ICD-10-CM

## 2023-09-26 DIAGNOSIS — D509 Iron deficiency anemia, unspecified: Secondary | ICD-10-CM | POA: Diagnosis not present

## 2023-09-26 DIAGNOSIS — K2970 Gastritis, unspecified, without bleeding: Secondary | ICD-10-CM | POA: Diagnosis not present

## 2023-09-26 DIAGNOSIS — K269 Duodenal ulcer, unspecified as acute or chronic, without hemorrhage or perforation: Secondary | ICD-10-CM | POA: Diagnosis not present

## 2023-09-26 DIAGNOSIS — D62 Acute posthemorrhagic anemia: Secondary | ICD-10-CM | POA: Diagnosis not present

## 2023-09-26 DIAGNOSIS — I10 Essential (primary) hypertension: Secondary | ICD-10-CM | POA: Diagnosis not present

## 2023-09-26 DIAGNOSIS — I480 Paroxysmal atrial fibrillation: Secondary | ICD-10-CM | POA: Diagnosis not present

## 2023-09-26 DIAGNOSIS — K922 Gastrointestinal hemorrhage, unspecified: Secondary | ICD-10-CM | POA: Diagnosis not present

## 2023-09-26 DIAGNOSIS — K2951 Unspecified chronic gastritis with bleeding: Secondary | ICD-10-CM | POA: Diagnosis not present

## 2023-09-26 DIAGNOSIS — I4891 Unspecified atrial fibrillation: Secondary | ICD-10-CM | POA: Diagnosis not present

## 2023-09-26 DIAGNOSIS — M3219 Other organ or system involvement in systemic lupus erythematosus: Secondary | ICD-10-CM | POA: Diagnosis not present

## 2023-09-26 HISTORY — PX: ESOPHAGOGASTRODUODENOSCOPY (EGD) WITH PROPOFOL: SHX5813

## 2023-09-26 HISTORY — PX: BIOPSY: SHX5522

## 2023-09-26 LAB — CBC
HCT: 28.6 % — ABNORMAL LOW (ref 36.0–46.0)
Hemoglobin: 9.1 g/dL — ABNORMAL LOW (ref 12.0–15.0)
MCH: 31.4 pg (ref 26.0–34.0)
MCHC: 31.8 g/dL (ref 30.0–36.0)
MCV: 98.6 fL (ref 80.0–100.0)
Platelets: 305 10*3/uL (ref 150–400)
RBC: 2.9 MIL/uL — ABNORMAL LOW (ref 3.87–5.11)
RDW: 16.4 % — ABNORMAL HIGH (ref 11.5–15.5)
WBC: 6.3 10*3/uL (ref 4.0–10.5)
nRBC: 0 % (ref 0.0–0.2)

## 2023-09-26 LAB — RENAL FUNCTION PANEL
Albumin: 3 g/dL — ABNORMAL LOW (ref 3.5–5.0)
Anion gap: 9 (ref 5–15)
BUN: 15 mg/dL (ref 8–23)
CO2: 22 mmol/L (ref 22–32)
Calcium: 8 mg/dL — ABNORMAL LOW (ref 8.9–10.3)
Chloride: 105 mmol/L (ref 98–111)
Creatinine, Ser: 1.06 mg/dL — ABNORMAL HIGH (ref 0.44–1.00)
GFR, Estimated: 54 mL/min — ABNORMAL LOW (ref >60)
Glucose, Bld: 86 mg/dL (ref 70–99)
Phosphorus: 2.8 mg/dL (ref 2.5–4.6)
Potassium: 4 mmol/L (ref 3.5–5.1)
Sodium: 136 mmol/L (ref 135–145)

## 2023-09-26 LAB — GLUCOSE, CAPILLARY: Glucose-Capillary: 89 mg/dL (ref 70–99)

## 2023-09-26 LAB — MAGNESIUM: Magnesium: 2.1 mg/dL (ref 1.7–2.4)

## 2023-09-26 SURGERY — ESOPHAGOGASTRODUODENOSCOPY (EGD) WITH PROPOFOL
Anesthesia: Monitor Anesthesia Care

## 2023-09-26 MED ORDER — PROPOFOL 10 MG/ML IV BOLUS
INTRAVENOUS | Status: AC
  Filled 2023-09-26: qty 20

## 2023-09-26 MED ORDER — SODIUM CHLORIDE 0.9 % IV SOLN: INTRAVENOUS | Status: DC | PRN

## 2023-09-26 MED ORDER — PHENOL 1.4 % MT LIQD
1.0000 | OROMUCOSAL | Status: DC | PRN
  Administered 2023-09-26: 1 via OROMUCOSAL
  Filled 2023-09-26: qty 177

## 2023-09-26 MED ORDER — PEG-KCL-NACL-NASULF-NA ASC-C 100 G PO SOLR
0.5000 | Freq: Two times a day (BID) | ORAL | Status: AC
Start: 2023-09-26 — End: 2023-09-26
  Administered 2023-09-26 (×2): 100 g via ORAL
  Filled 2023-09-26: qty 1

## 2023-09-26 MED ORDER — LIDOCAINE 2% (20 MG/ML) 5 ML SYRINGE
INTRAMUSCULAR | Status: DC | PRN
  Administered 2023-09-26: 60 mg via INTRAVENOUS

## 2023-09-26 MED ORDER — DEXTROSE-SODIUM CHLORIDE 5-0.9 % IV SOLN
INTRAVENOUS | Status: DC
Start: 2023-09-27 — End: 2023-09-27

## 2023-09-26 MED ORDER — PROPOFOL 500 MG/50ML IV EMUL
INTRAVENOUS | Status: DC | PRN
  Administered 2023-09-26: 50 mg via INTRAVENOUS
  Administered 2023-09-26: 100 mg via INTRAVENOUS
  Administered 2023-09-26: 50 mg via INTRAVENOUS

## 2023-09-26 SURGICAL SUPPLY — 14 items

## 2023-09-26 NOTE — Op Note (Signed)
[facility] Patient Name: Hannah [name] Procedure Date: 09/26/2023 MRN: [id number] Attending MD: Meryl Dare , MD, [phone] Date of Birth: 01-Apr-1946 CSN: [id number] Age: 77 Admit Type: Inpatient Procedure:                Upper GI endoscopy Indications:              Anemia, Heme positive stool Providers:                Venita Lick. Russella Dar, MD, Eliberto Ivory, RN, Salley Scarlet, Technician, Elvia Collum, CRNA Referring MD:             [name], MD Medicines:                Monitored Anesthesia Care Complications:            No immediate complications. Estimated Blood Loss:     Estimated blood loss was minimal. Procedure:                Pre-Anesthesia Assessment:                           - Prior to the procedure, a History and Physical                            was performed, and patient medications and                            allergies were reviewed. The patient's tolerance of                            previous anesthesia was also reviewed. The risks                            and benefits of the procedure and the sedation                            options and risks were discussed with the patient.                            All questions were answered, and informed consent                            was obtained. Prior Anticoagulants: The patient has                            taken Eliquis (apixaban), last dose was 2 days                            prior to procedure. ASA Grade Assessment: II - A                            patient with mild systemic disease. After reviewing  the risks and benefits, the patient was deemed in                            satisfactory condition to undergo the procedure.                           After obtaining informed consent, the endoscope was                            passed under direct vision. Throughout the                            procedure, the patient's  blood pressure, pulse, and                            oxygen saturations were monitored continuously. The                            [id number]-[id number] ([id number] Olympus endoscope was introduced                            through the mouth, and advanced to the second part                            of duodenum. The upper GI endoscopy was                            accomplished without difficulty. The patient                            tolerated the procedure well. Scope In: Scope Out: Findings:      One benign-appearing, intrinsic moderate stenosis was found 32 cm from       the incisors. This stenosis measured 1.3 cm (inner diameter) x less than       one cm (in length). The stenosis was traversed.      The exam of the esophagus was otherwise normal.      Localized moderate inflammation characterized by congestion (edema),       erythema and granularity was found on the lesser curvature of the       gastric antrum. Biopsies were taken with a cold forceps for histology.      The gastroesophageal flap valve was visualized endoscopically and       classified as Hill Grade IV (no fold, wide open lumen, hiatal hernia       present).      The exam of the stomach was otherwise normal.      One non-bleeding linear duodenal ulcer with a clean ulcer base (Forrest       Class III) was found in the duodenal bulb. The lesion was 6 mm in       largest dimension.      The exam of the duodenum was otherwise normal.      A medium-sized hiatal hernia was present. Impression:               - Benign-appearing esophageal stenosis.                           -  Gastritis. Biopsied.                           - Medium-sized hiatal hernia.                           - Non-bleeding duodenal ulcer with a clean ulcer                            base (Forrest Class III). Moderate Sedation:      Not Applicable - Patient had care per Anesthesia. Recommendation:           - Return patient to hospital ward for ongoing care.                            - Clear diet today.                           - Continue present medications.                           - Await pathology results.                           - Continue to hold Eliquis.                           - Colonoscopy tomorrow by Dr. Loreta Ave or Dr Elnoria Howard to                            further evaluate anemia, heme + stool. Procedure Code(s):        --- Professional ---                           [id number], Esophagogastroduodenoscopy, flexible,                            transoral; with biopsy, single or multiple Diagnosis Code(s):        --- Professional ---                           K22.2, Esophageal obstruction                           K29.70, Gastritis, unspecified, without bleeding                           K26.9, Duodenal ulcer, unspecified as acute or                            chronic, without hemorrhage or perforation                           D50.9, Iron deficiency anemia, unspecified                           R19.5, Other fecal abnormalities CPT copyright 2022 American Medical  Association. All rights reserved. The codes documented in this report are preliminary and upon coder review may  be revised to meet current compliance requirements. Meryl Dare, MD 09/26/2023 8:01:55 AM This report has been signed electronically. Number of Addenda: 0

## 2023-09-26 NOTE — Interval H&P Note (Signed)
History and Physical Interval Note:  09/26/2023 7:34 AM  Hannah [name]  has presented today for surgery, with the diagnosis of GI bleed.  The various methods of treatment have been discussed with the patient and family. After consideration of risks, benefits and other options for treatment, the patient has consented to  Procedure(s): ESOPHAGOGASTRODUODENOSCOPY (EGD) WITH PROPOFOL (N/A) as a surgical intervention.  The patient's history has been reviewed, patient examined, no change in status, stable for surgery.  I have reviewed the patient's chart and labs.  Questions were answered to the patient's satisfaction.     Venita Lick. Russella Dar

## 2023-09-26 NOTE — Anesthesia Postprocedure Evaluation (Signed)
Anesthesia Post Note  Patient: Hannah [name]  Procedure(s) Performed: ESOPHAGOGASTRODUODENOSCOPY (EGD) WITH PROPOFOL BIOPSY     Patient location during evaluation: PACU Anesthesia Type: MAC Level of consciousness: awake and alert Pain management: pain level controlled Vital Signs Assessment: post-procedure vital signs reviewed and stable Respiratory status: spontaneous breathing, nonlabored ventilation and respiratory function stable Cardiovascular status: blood pressure returned to baseline and stable Postop Assessment: no apparent nausea or vomiting Anesthetic complications: no   No notable events documented.  Last Vitals:  Vitals:   09/26/23 0800 09/26/23 0815  BP: (!) 136/100 (!) 108/48  Pulse: 72 66  Resp: 20 16  Temp: (!) 36.4 C   SpO2: 100% 98%    Last Pain:  Vitals:   09/26/23 0815  TempSrc:   PainSc: 0-No pain                 Lannie Fields

## 2023-09-26 NOTE — Progress Notes (Signed)
Physical Therapy Treatment Patient Details Name: Hannah [name] MRN: [id number] DOB: Dec 13, 1945 Today's Date: 09/26/2023   History of Present Illness 77 year old F with PMH of A-fib on Eliquis, IDA, heart murmur, bilateral knee arthritis s/p recent right TKA presenting with dizziness, chills and palor, and admitted for symptomatic anemia likely due to GI blood loss. Recent Right knee manipulation on 09/13/23 and currently receiving outpatient PT services.    PT Comments  Pt agreeable to working with therapy. Improved activity tolerance on today. Encouraged her to work on quad sets throughout the day to try to help regain knee extension.     If plan is discharge home, recommend the following: Assist for transportation   Can travel by private vehicle        Equipment Recommendations  None recommended by PT    Recommendations for Other Services       Precautions / Restrictions Precautions Precautions: Fall Restrictions Weight Bearing Restrictions Per Provider Order: No     Mobility  Bed Mobility               General bed mobility comments: oob in recliner    Transfers Overall transfer level: Modified independent Equipment used: Rolling walker (2 wheels)                    Ambulation/Gait Ambulation/Gait assistance: Modified independent (Device/Increase time) Gait Distance (Feet): 200 Feet Assistive device: Rolling walker (2 wheels) Gait Pattern/deviations: Knee flexed in stance - right, Decreased dorsiflexion - right       General Gait Details: Cues for safety and for pt to try to make heel contact instead of staying on forefoot. Fatigued towards end of distance.   Stairs             Wheelchair Mobility     Tilt Bed    Modified Rankin (Stroke Patients Only)       Balance Overall balance assessment: Mild deficits observed, not formally tested                                          Cognition Arousal:  Alert Behavior During Therapy: WFL for tasks assessed/performed Overall Cognitive Status: Within Functional Limits for tasks assessed                                          Exercises Total Joint Exercises Quad Sets: AROM, Right, 15 reps, AAROM Short Arc Quad: AROM, Right, Seated, 15 reps Long Arc Quad: 20 reps, Seated Knee Flexion: 20 reps, Seated    General Comments        Pertinent Vitals/Pain Pain Assessment Pain Assessment: Faces Faces Pain Scale: Hurts a little bit Pain Location: Right knee Pain Descriptors / Indicators: Discomfort, Shooting Pain Intervention(s): Monitored during session    Home Living                          Prior Function            PT Goals (current goals can now be found in the care plan section) Progress towards PT goals: Progressing toward goals    Frequency    Min 1X/week      PT Plan      Co-evaluation  AM-PAC PT "6 Clicks" Mobility   Outcome Measure  Help needed turning from your back to your side while in a flat bed without using bedrails?: None Help needed moving from lying on your back to sitting on the side of a flat bed without using bedrails?: None Help needed moving to and from a bed to a chair (including a wheelchair)?: None Help needed standing up from a chair using your arms (e.g., wheelchair or bedside chair)?: None Help needed to walk in hospital room?: None Help needed climbing 3-5 steps with a railing? : A Little 6 Click Score: 23    End of Session   Activity Tolerance: Patient tolerated treatment well Patient left: in chair;with call bell/phone within reach   PT Visit Diagnosis: Other abnormalities of gait and mobility (R26.89)     Time: 1610-9604 PT Time Calculation (min) (ACUTE ONLY): 21 min  Charges:    $Gait Training: 8-22 mins PT General Charges $$ ACUTE PT VISIT: 1 Visit                        Faye Ramsay, PT Acute Rehabilitation  Office:  [phone]

## 2023-09-26 NOTE — Anesthesia Preprocedure Evaluation (Addendum)
Anesthesia Evaluation  Patient identified by MRN, date of birth, ID band Patient awake    Reviewed: Allergy & Precautions, H&P , NPO status , Patient's Chart, lab work & pertinent test results  Airway Mallampati: III  TM Distance: >3 FB Neck ROM: Full    Dental  (+) Teeth Intact, Dental Advisory Given   Pulmonary asthma (well controlled, rescue inhlaer use once every 2 months)    Pulmonary exam normal breath sounds clear to auscultation       Cardiovascular hypertension (122/77 preop), Pt. on medications Normal cardiovascular exam Rhythm:Regular Rate:Normal  Eliquis LD 12/26    Neuro/Psych negative neurological ROS  negative psych ROS   GI/Hepatic negative GI ROS, Neg liver ROS,,,  Endo/Other  diabetesHypothyroidism    Renal/GU negative Renal ROS  negative genitourinary   Musculoskeletal  (+) Arthritis , Osteoarthritis,    Abdominal   Peds negative pediatric ROS (+)  Hematology negative hematology ROS (+)   Anesthesia Other Findings   Reproductive/Obstetrics negative OB ROS                             Anesthesia Physical Anesthesia Plan  ASA: 3  Anesthesia Plan: MAC   Post-op Pain Management:    Induction:   PONV Risk Score and Plan: 2 and Propofol infusion and TIVA  Airway Management Planned: Natural Airway and Simple Face Mask  Additional Equipment: None  Intra-op Plan:   Post-operative Plan:   Informed Consent: I have reviewed the patients History and Physical, chart, labs and discussed the procedure including the risks, benefits and alternatives for the proposed anesthesia with the patient or authorized representative who has indicated his/her understanding and acceptance.       Plan Discussed with: CRNA  Anesthesia Plan Comments:        Anesthesia Quick Evaluation

## 2023-09-26 NOTE — Transfer of Care (Signed)
Immediate Anesthesia Transfer of Care Note  Patient: Hannah [name]  Procedure(s) Performed: ESOPHAGOGASTRODUODENOSCOPY (EGD) WITH PROPOFOL  Patient Location: PACU  Anesthesia Type:MAC  Level of Consciousness: awake, alert , and oriented  Airway & Oxygen Therapy: Patient Spontanous Breathing  Post-op Assessment: Report given to RN and Post -op Vital signs reviewed and stable  Post vital signs: Reviewed and stable  Last Vitals:  Vitals Value Taken Time  BP 136/100 09/26/23 0800  Temp    Pulse    Resp 28 09/26/23 0801  SpO2    Vitals shown include unfiled device data.  Last Pain:  Vitals:   09/26/23 0720  TempSrc: Temporal  PainSc: 0-No pain         Complications: No notable events documented.

## 2023-09-26 NOTE — Progress Notes (Signed)
PROGRESS NOTE  Hannah [name] UJW:[id number] DOB: 10-Sep-1946   PCP: Dorothyann Peng, MD  Patient is from: Home.  Lives alone.  Uses rolling walker since knee surgery.  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  Hgb 6.2 (was 9.6 about 10 days prior).  Hemoccult positive.  Total bili 2.4.  No report of melena or hematochezia.  She had a closed right knee manipulation with minimal surrounding bruising on 12/16.  Denies NSAID use.  GI consulted.  Transfused 2 units with appropriate response.   Hemoglobin remained stable after 2 units.  EGD on 12/29 with benign-appearing esophageal stenosis, gastritis (biopsied), medium sized hiatal hernia and nonbleeding duodenal ulcer with clean ulcer base (Forrest class III).  GI recommended clear liquid diet and n.p.o. after midnight for colonoscopy on 12/30  Subjective: Seen and examined earlier this morning after she returned from EGD.  No major events overnight of this morning.  No complaints other than some sore throat from EGD.  Discussed EGD findings with patient.  Objective: Vitals:   09/26/23 0815 09/26/23 0830 09/26/23 0853 09/26/23 0948  BP: (!) 108/48 (!) 108/54 115/71 120/64  Pulse: 66 65 65 62  Resp: 16 20 18    Temp:  98 F (36.7 C) 98 F (36.7 C)   TempSrc:   Oral   SpO2: 98% 98% 100%   Weight:      Height:        Examination:  GENERAL: No apparent distress.  Nontoxic. HEENT: MMM.  Vision and hearing grossly intact.  NECK: Supple.  No apparent JVD.  RESP:  No IWOB.  Fair aeration bilaterally. CVS:  RRR. Heart sounds normal.  ABD/GI/GU: BS+. Abd soft, NTND.  MSK/EXT:  Moves extremities.  Some right knee swelling  with old bruise.  During ROM exercise. SKIN: Bruising over right knee as above. NEURO: Awake, alert and oriented appropriately.  No apparent focal neuro deficit. PSYCH: Calm. Normal affect.   Procedures:  12/29-EGD with benign-appearing esophageal stenosis, gastritis (biopsied), medium sized hiatal hernia and nonbleeding duodenal ulcer with clean ulcer base (Forrest class III).   Microbiology summarized: COVID-19, influenza and RSV PCR nonreactive  Assessment and plan: Symptomatic acute blood loss anemia: Suspected GI blood with positive Hemoccult.  Patient is on Eliquis for A-fib.  Denies NSAID use.  Total bili and LDH slightly elevated but normal haptoglobin.  Transfused 2 units with appropriate response.  No nutritional deficiency on anemia panel.  EGD as above. Recent Labs    05/26/23 0905 07/05/23 0828 07/13/23 0327 09/13/23 1600 09/23/23 1606 09/24/23 0614 09/24/23 1524 09/24/23 2332 09/25/23 0636 09/26/23 0445  HGB 10.1* 10.3* 8.8* 9.4* 6.2* 7.1* 9.3* 8.7* 9.1* 9.1*  -Monitor H&H.  Transfuse for Hgb <7.0. -Continue holding Eliquis -Continue Protonix 40 mg twice daily -CLD today and n.p.o. after midnight for colonoscopy on 12/30  Benign esophageal stenosis/gastritis/duodenal ulcer -Continue PPI as above -Follow-up pathology from gastritis  Hyperbilirubinemia: Hemolysis?  LDH is slightly elevated but normal haptoglobin.  Improved. -Continue trending.  Paroxysmal atrial fibrillation: Rate controlled. -Continue home metoprolol 25 mg twice daily -Continue holding Eliquis -Optimize electrolytes   CKD-3A: Stable -Continue monitoring   RA: Stable -Cont plaquenil.  Essential hypertension: Normotensive -Continue holding norvasc and irbesartan -Resume home metoprolol  Recent right TKA: Noted some swelling and old bruising.  Some limited ROM.  Working with therapy. -PT/OT and ROM exercise -Outpatient follow-up  Obesity Body mass index is 31.98 kg/m. -Encourage  lifestyle change to lose weight          DVT prophylaxis:  SCDs Start: 09/23/23 1938  Code Status: Full code Family Communication: None at bedside Level of care: Telemetry Status is: Observation The patient will require care spanning > 2 midnights and should be moved to inpatient because: Symptomatic anemia due to acute blood loss   Final disposition: Likely home Consultants:  Gastroenterology  35 minutes with more than 50% spent in reviewing records, counseling patient/family and coordinating care.   Sch Meds:  Scheduled Meds:  sodium chloride   Intravenous Once   metoprolol tartrate  25 mg Oral Q12H   pantoprazole (PROTONIX) IV  40 mg Intravenous Q12H   peg 3350 powder  0.5 kit Oral BID   Continuous Infusions:   PRN Meds:.acetaminophen **OR** acetaminophen, ondansetron **OR** ondansetron (ZOFRAN) IV, mouth rinse, phenol  Antimicrobials: Anti-infectives (From admission, onward)    None        I have personally reviewed the following labs and images: CBC: Recent Labs  Lab 09/23/23 1606 09/24/23 0614 09/24/23 1524 09/24/23 2332 09/25/23 0636 09/26/23 0445  WBC 10.7* 8.6  --   --  7.1 6.3  NEUTROABS 9.1*  --   --   --   --   --   HGB 6.2* 7.1* 9.3* 8.7* 9.1* 9.1*  HCT 20.1* 21.9* 28.5* 26.5* 27.7* 28.6*  MCV 100.5* 99.1  --   --  94.9 98.6  PLT 381 307  --   --  321 305   BMP &GFR Recent Labs  Lab 09/23/23 1606 09/24/23 0614 09/25/23 0636 09/26/23 0445  NA 137 136 135 136  K 4.6 4.3 3.9 4.0  CL 104 104 105 105  CO2 23 22 21* 22  GLUCOSE 105* 69* 98 86  BUN 23 24* 15 15  CREATININE 1.32* 1.19* 1.07* 1.06*  CALCIUM 9.1 8.3* 8.1* 8.0*  MG  --   --  2.2 2.1  PHOS  --   --  2.6 2.8   Estimated Creatinine Clearance: 36.1 mL/min (A) (by C-G formula based on SCr of 1.06 mg/dL (H)). Liver & Pancreas: Recent Labs  Lab 09/23/23 1606 09/24/23 0614 09/25/23 0636 09/26/23 0445  AST 33 28  --   --   ALT 30 21  --   --   ALKPHOS 66 53  --   --    BILITOT 2.4* 1.8*  --   --   PROT 7.0 5.9*  --   --   ALBUMIN 3.7 3.1* 3.1* 3.0*   No results for input(s): "LIPASE", "AMYLASE" in the last 168 hours. No results for input(s): "AMMONIA" in the last 168 hours. Diabetic: No results for input(s): "HGBA1C" in the last 72 hours. Recent Labs  Lab 09/26/23 0801  GLUCAP 89   Cardiac Enzymes: No results for input(s): "CKTOTAL", "CKMB", "CKMBINDEX", "TROPONINI" in the last 168 hours. No results for input(s): "PROBNP" in the last 8760 hours. Coagulation Profile: Recent Labs  Lab 09/23/23 1800  INR 1.9*   Thyroid Function Tests: No results for input(s): "TSH", "T4TOTAL", "FREET4", "T3FREE", "THYROIDAB" in the last 72 hours. Lipid Profile: No results for input(s): "CHOL", "HDL", "LDLCALC", "TRIG", "CHOLHDL", "LDLDIRECT" in the last 72 hours. Anemia Panel: Recent Labs  09/24/23 1524  VITAMINB12 1,301*  FOLATE 38.6  FERRITIN 720*  TIBC 213*  IRON 53  RETICCTPCT 4.2*   Urine analysis:    Component Value Date/Time   COLORURINE YELLOW 09/23/2023 1606   APPEARANCEUR HAZY (A) 09/23/2023 1606   LABSPEC 1.014 09/23/2023 1606   PHURINE 5.0 09/23/2023 1606   GLUCOSEU >=500 (A) 09/23/2023 1606   HGBUR NEGATIVE 09/23/2023 1606   BILIRUBINUR NEGATIVE 09/23/2023 1606   BILIRUBINUR Negative 01/12/2023 1629   KETONESUR 5 (A) 09/23/2023 1606   PROTEINUR 30 (A) 09/23/2023 1606   UROBILINOGEN 0.2 01/12/2023 1629   NITRITE NEGATIVE 09/23/2023 1606   LEUKOCYTESUR SMALL (A) 09/23/2023 1606   Sepsis Labs: Invalid input(s): "PROCALCITONIN", "LACTICIDVEN"  Microbiology: Recent Results (from the past 240 hours)  Resp panel by RT-PCR (RSV, Flu A&B, Covid) Anterior Nasal Swab     Status: None   Collection Time: 09/23/23  3:42 PM   Specimen: Anterior Nasal Swab  Result Value Ref Range Status   SARS Coronavirus 2 by RT PCR NEGATIVE NEGATIVE Final    Comment: (NOTE) SARS-CoV-2 target nucleic acids are NOT DETECTED.  The SARS-CoV-2 RNA is  generally detectable in upper respiratory specimens during the acute phase of infection. The lowest concentration of SARS-CoV-2 viral copies this assay can detect is 138 copies/mL. A negative result does not preclude SARS-Cov-2 infection and should not be used as the sole basis for treatment or other patient management decisions. A negative result may occur with  improper specimen collection/handling, submission of specimen other than nasopharyngeal swab, presence of viral mutation(s) within the areas targeted by this assay, and inadequate number of viral copies(<138 copies/mL). A negative result must be combined with clinical observations, patient history, and epidemiological information. The expected result is Negative.  Fact Sheet for Patients:  BloggerCourse.com  Fact Sheet for Healthcare Providers:  SeriousBroker.it  This test is no t yet approved or cleared by the [place] FDA and  has been authorized for detection and/or diagnosis of SARS-CoV-2 by FDA under an Emergency Use Authorization (EUA). This EUA will remain  in effect (meaning this test can be used) for the duration of the COVID-19 declaration under Section 564(b)(1) of the Act, 21 U.S.C.section 360bbb-3(b)(1), unless the authorization is terminated  or revoked sooner.       Influenza A by PCR NEGATIVE NEGATIVE Final   Influenza B by PCR NEGATIVE NEGATIVE Final    Comment: (NOTE) The Xpert Xpress SARS-CoV-2/FLU/RSV plus assay is intended as an aid in the diagnosis of influenza from Nasopharyngeal swab specimens and should not be used as a sole basis for treatment. Nasal washings and aspirates are unacceptable for Xpert Xpress SARS-CoV-2/FLU/RSV testing.  Fact Sheet for Patients: BloggerCourse.com  Fact Sheet for Healthcare Providers: SeriousBroker.it  This test is not yet approved or cleared by the [place] FDA and has been authorized for detection and/or diagnosis of SARS-CoV-2 by FDA under an Emergency Use Authorization (EUA). This EUA will remain in effect (meaning this test can be used) for the duration of the COVID-19 declaration under Section 564(b)(1) of the Act, 21 U.S.C. section 360bbb-3(b)(1), unless the authorization is terminated or revoked.     Resp Syncytial Virus by PCR NEGATIVE NEGATIVE Final    Comment: (NOTE) Fact Sheet for Patients: BloggerCourse.com  Fact Sheet for Healthcare Providers: SeriousBroker.it  This test is not yet approved or cleared by the [place] FDA and has been authorized for detection and/or diagnosis of SARS-CoV-2 by FDA under an Emergency Use Authorization (EUA).  This EUA will remain in effect (meaning this test can be used) for the duration of the COVID-19 declaration under Section 564(b)(1) of the Act, 21 U.S.C. section 360bbb-3(b)(1), unless the authorization is terminated or revoked.  Performed at [facility], [address] [place], [place] [address]     Radiology Studies: No results found.    Antwione Picotte T. Berea Majkowski [facility]  If 7PM-7AM, please contact night-coverage [email or web address] 09/26/2023, 11:35 AM

## 2023-09-27 ENCOUNTER — Observation Stay (HOSPITAL_COMMUNITY): Payer: Medicare HMO | Admitting: Anesthesiology

## 2023-09-27 ENCOUNTER — Encounter (HOSPITAL_COMMUNITY): Payer: Self-pay | Admitting: Internal Medicine

## 2023-09-27 ENCOUNTER — Encounter (HOSPITAL_COMMUNITY)
Admission: EM | Disposition: A | Discharge status: Home / Self Care | Payer: Self-pay | Source: Home / Self Care | Attending: Emergency Medicine

## 2023-09-27 DIAGNOSIS — D62 Acute posthemorrhagic anemia: Secondary | ICD-10-CM | POA: Diagnosis not present

## 2023-09-27 DIAGNOSIS — R195 Other fecal abnormalities: Secondary | ICD-10-CM | POA: Diagnosis not present

## 2023-09-27 DIAGNOSIS — Z1211 Encounter for screening for malignant neoplasm of colon: Secondary | ICD-10-CM | POA: Diagnosis not present

## 2023-09-27 DIAGNOSIS — E1122 Type 2 diabetes mellitus with diabetic chronic kidney disease: Secondary | ICD-10-CM | POA: Diagnosis not present

## 2023-09-27 DIAGNOSIS — K5730 Diverticulosis of large intestine without perforation or abscess without bleeding: Secondary | ICD-10-CM | POA: Diagnosis not present

## 2023-09-27 DIAGNOSIS — N1831 Chronic kidney disease, stage 3a: Secondary | ICD-10-CM | POA: Diagnosis not present

## 2023-09-27 DIAGNOSIS — M0579 Rheumatoid arthritis with rheumatoid factor of multiple sites without organ or systems involvement: Secondary | ICD-10-CM | POA: Diagnosis not present

## 2023-09-27 DIAGNOSIS — K921 Melena: Secondary | ICD-10-CM | POA: Diagnosis not present

## 2023-09-27 DIAGNOSIS — I129 Hypertensive chronic kidney disease with stage 1 through stage 4 chronic kidney disease, or unspecified chronic kidney disease: Secondary | ICD-10-CM | POA: Diagnosis not present

## 2023-09-27 DIAGNOSIS — I480 Paroxysmal atrial fibrillation: Secondary | ICD-10-CM | POA: Diagnosis not present

## 2023-09-27 DIAGNOSIS — K5790 Diverticulosis of intestine, part unspecified, without perforation or abscess without bleeding: Secondary | ICD-10-CM | POA: Diagnosis not present

## 2023-09-27 DIAGNOSIS — D509 Iron deficiency anemia, unspecified: Secondary | ICD-10-CM | POA: Diagnosis not present

## 2023-09-27 DIAGNOSIS — M3219 Other organ or system involvement in systemic lupus erythematosus: Secondary | ICD-10-CM | POA: Diagnosis not present

## 2023-09-27 DIAGNOSIS — I10 Essential (primary) hypertension: Secondary | ICD-10-CM | POA: Diagnosis not present

## 2023-09-27 DIAGNOSIS — Z87448 Personal history of other diseases of urinary system: Secondary | ICD-10-CM | POA: Diagnosis not present

## 2023-09-27 DIAGNOSIS — K922 Gastrointestinal hemorrhage, unspecified: Secondary | ICD-10-CM | POA: Diagnosis not present

## 2023-09-27 HISTORY — PX: COLONOSCOPY WITH PROPOFOL: SHX5780

## 2023-09-27 LAB — BPAM RBC
Blood Product Expiration Date: 202501262359
Blood Product Expiration Date: 202501262359
ISSUE DATE / TIME: 202412262003
ISSUE DATE / TIME: 202412270948
Unit Type and Rh: 6200
Unit Type and Rh: 6200

## 2023-09-27 LAB — RENAL FUNCTION PANEL
Albumin: 3 g/dL — ABNORMAL LOW (ref 3.5–5.0)
Anion gap: 9 (ref 5–15)
BUN: 12 mg/dL (ref 8–23)
CO2: 20 mmol/L — ABNORMAL LOW (ref 22–32)
Calcium: 7.7 mg/dL — ABNORMAL LOW (ref 8.9–10.3)
Chloride: 109 mmol/L (ref 98–111)
Creatinine, Ser: 0.96 mg/dL (ref 0.44–1.00)
GFR, Estimated: >60 mL/min (ref >60)
Glucose, Bld: 85 mg/dL (ref 70–99)
Phosphorus: 2.7 mg/dL (ref 2.5–4.6)
Potassium: 3.8 mmol/L (ref 3.5–5.1)
Sodium: 138 mmol/L (ref 135–145)

## 2023-09-27 LAB — TYPE AND SCREEN
ABO/RH(D): A POS
Antibody Screen: NEGATIVE
Unit division: 0
Unit division: 0

## 2023-09-27 LAB — GLUCOSE, CAPILLARY
Glucose-Capillary: 76 mg/dL (ref 70–99)
Glucose-Capillary: 127 mg/dL — ABNORMAL HIGH (ref 70–99)

## 2023-09-27 LAB — CBC
HCT: 27.8 % — ABNORMAL LOW (ref 36.0–46.0)
Hemoglobin: 8.9 g/dL — ABNORMAL LOW (ref 12.0–15.0)
MCH: 31.6 pg (ref 26.0–34.0)
MCHC: 32 g/dL (ref 30.0–36.0)
MCV: 98.6 fL (ref 80.0–100.0)
Platelets: 307 10*3/uL (ref 150–400)
RBC: 2.82 MIL/uL — ABNORMAL LOW (ref 3.87–5.11)
RDW: 15.9 % — ABNORMAL HIGH (ref 11.5–15.5)
WBC: 6 10*3/uL (ref 4.0–10.5)
nRBC: 0 % (ref 0.0–0.2)

## 2023-09-27 LAB — MAGNESIUM: Magnesium: 2.1 mg/dL (ref 1.7–2.4)

## 2023-09-27 SURGERY — COLONOSCOPY WITH PROPOFOL
Anesthesia: Monitor Anesthesia Care

## 2023-09-27 MED ORDER — DEXTROSE-SODIUM CHLORIDE 5-0.9 % IV SOLN
INTRAVENOUS | Status: AC
  Administered 2023-09-27: 50 mL/h via INTRAVENOUS

## 2023-09-27 MED ORDER — PROPOFOL 500 MG/50ML IV EMUL
INTRAVENOUS | Status: DC | PRN
  Administered 2023-09-27 (×2): 30 mg via INTRAVENOUS
  Administered 2023-09-27: 200 ug/kg/min via INTRAVENOUS

## 2023-09-27 SURGICAL SUPPLY — 21 items
ELECT REM PT RETURN 9FT ADLT (ELECTROSURGICAL)
ELECTRODE REM PT RTRN 9FT ADLT (ELECTROSURGICAL) IMPLANT
FCP BXJMBJMB 240X2.8X (CUTTING FORCEPS)
FLOOR PAD 36X40 (MISCELLANEOUS) ×1
FORCEPS BIOP RAD 4 LRG CAP 4 (CUTTING FORCEPS) IMPLANT
FORCEPS BIOP RJ4 240 W/NDL (CUTTING FORCEPS)
FORCEPS BXJMBJMB 240X2.8X (CUTTING FORCEPS) IMPLANT
INJECTOR/SNARE I SNARE (MISCELLANEOUS) IMPLANT
LUBRICANT JELLY 4.5OZ STERILE (MISCELLANEOUS) IMPLANT
MANIFOLD NEPTUNE II (INSTRUMENTS) IMPLANT
NDL SCLEROTHERAPY 25GX240 (NEEDLE) IMPLANT
NEEDLE SCLEROTHERAPY 25GX240 (NEEDLE) IMPLANT
PAD FLOOR 36X40 (MISCELLANEOUS) ×1 IMPLANT
PROBE APC STR FIRE (PROBE) IMPLANT
PROBE INJECTION GOLD 7FR (MISCELLANEOUS) IMPLANT
SNARE ROTATE MED OVAL 20MM (MISCELLANEOUS) IMPLANT
SYR 50ML LL SCALE MARK (SYRINGE) IMPLANT
TRAP SPECIMEN MUCOUS 40CC (MISCELLANEOUS) IMPLANT
TUBING ENDO SMARTCAP PENTAX (MISCELLANEOUS) IMPLANT
TUBING IRRIGATION ENDOGATOR (MISCELLANEOUS) ×1 IMPLANT
WATER STERILE IRR 1000ML POUR (IV SOLUTION) IMPLANT

## 2023-09-27 NOTE — Op Note (Addendum)
[facility] Patient Name: Hannah [name] Procedure Date: 09/27/2023 MRN: [id number] Attending MD: [name] , MD, [id number] Date of Birth: 1946/07/22 CSN: [id number] Age: 77 Admit Type: Inpatient Procedure:                Diagnostic colonoscopy. Indications:              Iron deficiency anemia, Guaiac positive stools; CRC                            screening for colorectal malignant neoplasm. Providers:                [name], MD, Suzy Bouchard, RN, Rogue Jury,                            RN, Rozetta Nunnery, Technician, Marja Kays,                            CRNA Referring MD:             Dorothyann Peng, MD Medicines:                Monitored Anesthesia Care Complications:            No immediate complications. Estimated Blood Loss:     Estimated blood loss: none. Procedure:                Pre-Anesthesia Assessment: - Prior to the                            procedure, a history and physical was performed,                            and patient medications and allergies were                            reviewed. The patient's tolerance of previous                            anesthesia was also reviewed. The risks and                            benefits of the procedure and the sedation options                            and risks were discussed with the patient. All                            questions were answered, and informed consent was                            obtained. Prior Anticoagulants: The patient has                            taken Eliquis (apixaban), last dose was 4 days  prior to procedure. ASA Grade Assessment: III - A                            patient with severe systemic disease. After                            reviewing the risks and benefits, the patient was                            deemed in satisfactory condition to undergo the                            procedure. After obtaining informed consent,  the                            colonoscope was passed under direct vision.                            Throughout the procedure, the patient's blood                            pressure, pulse, and oxygen saturations were                            monitored continuously. The CF-HQ190L ([id number])                            Olympus colonoscope was introduced through the anus                            and advanced to the the cecum, identified by                            appendiceal orifice and ileocecal valve. The                            ileocecal valve, the appendiceal orifice and the                            rectum were photographed. The quality of the bowel                            preparation was adequate. The quality of the bowel                            preparation was evaluated using the BBPS [facility]                            Bowel Preparation Scale) with scores of: Right                            Colon = 2 (minor amount of residual staining, small  fragments of stool and/or opaque liquid, but mucosa                            seen well), Transverse Colon = 2 (minor amount of                            residual staining, small fragments of stool and/or                            opaque liquid, but mucosa seen well) and Left Colon                            = 3 (entire mucosa seen well with no residual                            staining, small fragments of stool or opaque                            liquid). The total BBPS score equals 7. The bowel                            preparation used was NuLytely via extended prep                            with split dose instruction. Scope In: 2:44:39 PM Scope Out: 2:55:01 PM Scope Withdrawal Time: 0 hours 4 minutes 22 seconds  Total Procedure Duration: 0 hours 10 minutes 22 seconds  Findings:      The perianal and digital rectal examinations were normal.      A few medium-mouthed diverticula were found  in the entire colon.      The exam was otherwise without abnormality on direct and retroflexion       views. Impression:               - Few scatteredb diverticula in the entire examined                            colon.                           - The examination was otherwise normal on direct                            and retroflexion views.                           - No specimens collected. Moderate Sedation:      MAC used. Recommendation:           - High fiber diet with augmented water consumption                            daily.                           - Continue present medications.                           -  Repeat colonoscopy is not recommended for                            surveillance due to patient's age.                           - Return to my office in 4 weeks for a follow up. Procedure Code(s):        --- Professional ---                           [phone], Colonoscopy, flexible; diagnostic, including                            collection of specimen(s) by brushing or washing,                            when performed (separate procedure) Diagnosis Code(s):        --- Professional ---                           D50.9, Iron deficiency anemia, unspecified                           K92.1, Melena (includes Hematochezia)                           K57.30, Diverticulosis of large intestine without                            perforation or abscess without bleeding                           Z12.11, Encounter for screening for malignant                            neoplasm of colon CPT copyright 2022 American Medical Association. All rights reserved. The codes documented in this report are preliminary and upon coder review may  be revised to meet current compliance requirements. [name], MD [name], MD 09/27/2023 3:08:02 PM This report has been signed electronically. Number of Addenda: 0

## 2023-09-27 NOTE — Interval H&P Note (Signed)
History and Physical Interval Note:  09/27/2023 2:37 PM  Hannah [name]  has presented today for surgery, with the diagnosis of heme + stool, anemia.  The various methods of treatment have been discussed with the patient and family. After consideration of risks, benefits and other options for treatment, the patient has consented to  Procedure(s): COLONOSCOPY WITH PROPOFOL (N/A) as a surgical intervention.  The patient's history has been reviewed, patient examined, no change in status, stable for surgery.  I have reviewed the patient's chart and labs.  Questions were answered to the patient's satisfaction.     [name]

## 2023-09-27 NOTE — Anesthesia Preprocedure Evaluation (Signed)
Anesthesia Evaluation  Patient identified by MRN, date of birth, ID band Patient awake    Reviewed: Allergy & Precautions, H&P , NPO status , Patient's Chart, lab work & pertinent test results, reviewed documented beta blocker date and time   Airway Mallampati: II       Dental  (+) Teeth Intact, Dental Advisory Given   Pulmonary asthma (well controlled, rescue inhlaer use once every 2 months)    Pulmonary exam normal        Cardiovascular hypertension, Pt. on medications Normal cardiovascular exam+ dysrhythmias Atrial Fibrillation   Eliquis LD 12/26    Neuro/Psych negative neurological ROS  negative psych ROS   GI/Hepatic negative GI ROS, Neg liver ROS,,,  Endo/Other  diabetes, Type 2, Oral Hypoglycemic AgentsHypothyroidism    Renal/GU Renal InsufficiencyRenal disease  negative genitourinary   Musculoskeletal  (+) Arthritis , Osteoarthritis,    Abdominal  (+) + obese  Peds negative pediatric ROS (+)  Hematology  (+) Blood dyscrasia, anemia   Anesthesia Other Findings   Reproductive/Obstetrics negative OB ROS                             Anesthesia Physical Anesthesia Plan  ASA: 3  Anesthesia Plan: MAC   Post-op Pain Management:    Induction:   PONV Risk Score and Plan: 2 and Propofol infusion and TIVA  Airway Management Planned: Natural Airway, Simple Face Mask and Nasal Cannula  Additional Equipment: None  Intra-op Plan:   Post-operative Plan:   Informed Consent: I have reviewed the patients History and Physical, chart, labs and discussed the procedure including the risks, benefits and alternatives for the proposed anesthesia with the patient or authorized representative who has indicated his/her understanding and acceptance.       Plan Discussed with: CRNA  Anesthesia Plan Comments:        Anesthesia Quick Evaluation

## 2023-09-27 NOTE — Transfer of Care (Signed)
Immediate Anesthesia Transfer of Care Note  Patient: Hannah [name]  Procedure(s) Performed: COLONOSCOPY WITH PROPOFOL  Patient Location: PACU  Anesthesia Type:General  Level of Consciousness: awake and alert   Airway & Oxygen Therapy: Patient Spontanous Breathing and Patient connected to nasal cannula oxygen  Post-op Assessment: Report given to RN and Post -op Vital signs reviewed and stable  Post vital signs: Reviewed and stable  Last Vitals:  Vitals Value Taken Time  BP    Temp    Pulse 58 09/27/23 1506  Resp 18 09/27/23 1506  SpO2 100 % 09/27/23 1506  Vitals shown include unfiled device data.  Last Pain:  Vitals:   09/27/23 1334  TempSrc: Temporal  PainSc: 6       Patients Stated Pain Goal: 0 (09/27/23 1334)  Complications: No notable events documented.

## 2023-09-27 NOTE — Progress Notes (Signed)
Mobility Specialist - Progress Note   09/27/23 1208  Mobility  Activity Ambulated with assistance in hallway  Level of Assistance Standby assist, set-up cues, supervision of patient - no hands on  Assistive Device Front wheel walker  Distance Ambulated (ft) 220 ft  Activity Response Tolerated well  Mobility Referral Yes  Mobility visit 1 Mobility  Mobility Specialist Start Time (ACUTE ONLY) 1154  Mobility Specialist Stop Time (ACUTE ONLY) 1208  Mobility Specialist Time Calculation (min) (ACUTE ONLY) 14 min   Pt received in recliner and agreeable to mobility. No complaints during session. Pt to recliner after session with all needs met.   [facility]

## 2023-09-27 NOTE — Progress Notes (Signed)
PROGRESS NOTE  Hannah [name] JXB:[id number] DOB: 1946/06/30   PCP: Dorothyann Peng, MD  Patient is from: Home.  Lives alone.  Uses rolling walker since knee surgery.  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  Hgb 6.2 (was 9.6 about 10 days prior).  Hemoccult positive.  Total bili 2.4.  No report of melena or hematochezia.  She had a closed right knee manipulation with minimal surrounding bruising on 12/16.  Denies NSAID use.  GI consulted.  Transfused 2 units with appropriate response.   Hemoglobin remained stable after 2 units.  EGD on 12/29 with benign-appearing esophageal stenosis, gastritis (biopsied), medium sized hiatal hernia and nonbleeding duodenal ulcer with clean ulcer base (Forrest class III).  Colonoscopy on 12/30 with few scattered diverticula in the entire colon.   Subjective: Seen and examined earlier this morning before colonoscopy.  No major events overnight of this morning.  Some soreness in the throat from endoscopy yesterday.  Objective: Vitals:   09/27/23 1505 09/27/23 1506 09/27/23 1510 09/27/23 1532  BP:  (!) 150/65 (!) 157/67 129/71  Pulse: (!) 56 (!) 58 (!) 58 63  Resp: (!) 22 (!) 21 19 17   Temp:  (!) 97.5 F (36.4 C)  97.7 F (36.5 C)  TempSrc:  Temporal  Oral  SpO2: 100% 100% 98% 99%  Weight:      Height:        Examination:  GENERAL: No apparent distress.  Nontoxic. HEENT: MMM.  Vision and hearing grossly intact.  NECK: Supple.  No apparent JVD.  RESP:  No IWOB.  Fair aeration bilaterally. CVS:  RRR. Heart sounds normal.  ABD/GI/GU: BS+. Abd soft, NTND.  MSK/EXT:  Moves extremities.  Some right knee swelling with old bruise.   During ROM exercise. SKIN: Bruising over right knee as above. NEURO: Awake, alert and oriented appropriately.  No apparent focal neuro deficit. PSYCH: Calm. Normal affect.   Procedures:  12/29-EGD with benign-appearing esophageal stenosis, gastritis (biopsied), medium sized hiatal hernia and nonbleeding duodenal ulcer with clean ulcer base (Forrest class III).   12/30-colonoscopy with few scattered diverticula in the entire examined colon  Microbiology summarized: COVID-19, influenza and RSV PCR nonreactive  Assessment and plan: Symptomatic acute blood loss anemia: Suspected GI blood with positive Hemoccult.  Patient is on Eliquis for A-fib.  Denies NSAID use.  Total bili and LDH slightly elevated but normal haptoglobin.  Transfused 2 units with appropriate response.  No nutritional deficiency on anemia panel.  EGD and colonoscopy as above. Recent Labs    07/05/23 0828 07/13/23 0327 09/13/23 1600 09/23/23 1606 09/24/23 1308 09/24/23 1524 09/24/23 2332 09/25/23 0636 09/26/23 0445 09/27/23 0432  HGB 10.3* 8.8* 9.4* 6.2* 7.1* 9.3* 8.7* 9.1* 9.1* 8.9*  -Monitor H&H.  Transfuse for Hgb <7.0. -Continue holding Eliquis until okay to resume from GI standpoint -Continue Protonix 40 mg twice daily -GI resumed regular diet.  Benign esophageal stenosis/gastritis/duodenal ulcer -Continue PPI as above -Follow-up pathology from gastritis  Hyperbilirubinemia: Hemolysis?  LDH is slightly elevated but normal haptoglobin.  Improved. -Continue trending.  Paroxysmal atrial fibrillation: Rate controlled. -Continue home metoprolol 25 mg twice daily -Continue holding Eliquis pending GI input -Optimize electrolytes  CKD-3A: Stable -Continue monitoring   RA: Stable -Cont plaquenil.   Essential hypertension: Normotensive -Continue holding norvasc and irbesartan -Continue home metoprolol.  Recent right TKA: Noted some swelling and old bruising.  Some limited ROM.  Working with  therapy. -PT/OT and ROM exercise-PT recommended outpatient follow-up  Obesity Body mass index is 31.98 kg/m. -Encourage lifestyle change to lose weight          DVT prophylaxis:  SCDs Start: 09/23/23 1938  Code Status: Full code Family Communication: None at bedside Level of care: Telemetry Status is: Observation The patient will require care spanning > 2 midnights and should be moved to inpatient because: Symptomatic anemia due to acute blood loss   Final disposition: Likely home in the next 24 to 48 hours Consultants:  Gastroenterology  35 minutes with more than 50% spent in reviewing records, counseling patient/family and coordinating care.   Sch Meds:  Scheduled Meds:  sodium chloride   Intravenous Once   metoprolol tartrate  25 mg Oral Q12H   pantoprazole (PROTONIX) IV  40 mg Intravenous Q12H   Continuous Infusions:  dextrose 5 % and 0.9 % NaCl 300 mL/hr (09/27/23 1501)    PRN Meds:.acetaminophen **OR** acetaminophen, ondansetron **OR** ondansetron (ZOFRAN) IV, mouth rinse, phenol  Antimicrobials: Anti-infectives (From admission, onward)    None        I have personally reviewed the following labs and images: CBC: Recent Labs  Lab 09/23/23 1606 09/24/23 0614 09/24/23 1524 09/24/23 2332 09/25/23 0636 09/26/23 0445 09/27/23 0432  WBC 10.7* 8.6  --   --  7.1 6.3 6.0  NEUTROABS 9.1*  --   --   --   --   --   --   HGB 6.2* 7.1* 9.3* 8.7* 9.1* 9.1* 8.9*  HCT 20.1* 21.9* 28.5* 26.5* 27.7* 28.6* 27.8*  MCV 100.5* 99.1  --   --  94.9 98.6 98.6  PLT 381 307  --   --  321 305 307   BMP &GFR Recent Labs  Lab 09/23/23 1606 09/24/23 0614 09/25/23 0636 09/26/23 0445 09/27/23 0432  NA 137 136 135 136 138  K 4.6 4.3 3.9 4.0 3.8  CL 104 104 105 105 109  CO2 23 22 21* 22 20*  GLUCOSE 105* 69* 98 86 85  BUN 23 24* 15 15 12   CREATININE 1.32* 1.19* 1.07* 1.06* 0.96  CALCIUM 9.1 8.3* 8.1* 8.0* 7.7*  MG  --   --  2.2 2.1 2.1  PHOS  --   --  2.6 2.8  2.7   Estimated Creatinine Clearance: 39.9 mL/min (by C-G formula based on SCr of 0.96 mg/dL). Liver & Pancreas: Recent Labs  Lab 09/23/23 1606 09/24/23 0614 09/25/23 0636 09/26/23 0445 09/27/23 0432  AST 33 28  --   --   --   ALT 30 21  --   --   --   ALKPHOS 66 53  --   --   --   BILITOT 2.4* 1.8*  --   --   --   PROT 7.0 5.9*  --   --   --   ALBUMIN 3.7 3.1* 3.1* 3.0* 3.0*   No results for input(s): "LIPASE", "AMYLASE" in the last 168 hours. No results for input(s): "AMMONIA" in the last 168 hours. Diabetic: No results for input(s): "HGBA1C" in the last 72 hours. Recent Labs  Lab 09/26/23 0801 09/27/23 1340 09/27/23 1512  GLUCAP 89 76 127*   Cardiac Enzymes: No results for input(s): "CKTOTAL", "CKMB", "CKMBINDEX", "  TROPONINI" in the last 168 hours. No results for input(s): "PROBNP" in the last 8760 hours. Coagulation Profile: Recent Labs  Lab 09/23/23 1800  INR 1.9*   Thyroid Function Tests: No results for input(s): "TSH", "T4TOTAL", "FREET4", "T3FREE", "THYROIDAB" in the last 72 hours. Lipid Profile: No results for input(s): "CHOL", "HDL", "LDLCALC", "TRIG", "CHOLHDL", "LDLDIRECT" in the last 72 hours. Anemia Panel: No results for input(s): "VITAMINB12", "FOLATE", "FERRITIN", "TIBC", "IRON", "RETICCTPCT" in the last 72 hours.  Urine analysis:    Component Value Date/Time   COLORURINE YELLOW 09/23/2023 1606   APPEARANCEUR HAZY (A) 09/23/2023 1606   LABSPEC 1.014 09/23/2023 1606   PHURINE 5.0 09/23/2023 1606   GLUCOSEU >=500 (A) 09/23/2023 1606   HGBUR NEGATIVE 09/23/2023 1606   BILIRUBINUR NEGATIVE 09/23/2023 1606   BILIRUBINUR Negative 01/12/2023 1629   KETONESUR 5 (A) 09/23/2023 1606   PROTEINUR 30 (A) 09/23/2023 1606   UROBILINOGEN 0.2 01/12/2023 1629   NITRITE NEGATIVE 09/23/2023 1606   LEUKOCYTESUR SMALL (A) 09/23/2023 1606   Sepsis Labs: Invalid input(s): "PROCALCITONIN", "LACTICIDVEN"  Microbiology: Recent Results (from the past 240 hours)   Resp panel by RT-PCR (RSV, Flu A&B, Covid) Anterior Nasal Swab     Status: None   Collection Time: 09/23/23  3:42 PM   Specimen: Anterior Nasal Swab  Result Value Ref Range Status   SARS Coronavirus 2 by RT PCR NEGATIVE NEGATIVE Final    Comment: (NOTE) SARS-CoV-2 target nucleic acids are NOT DETECTED.  The SARS-CoV-2 RNA is generally detectable in upper respiratory specimens during the acute phase of infection. The lowest concentration of SARS-CoV-2 viral copies this assay can detect is 138 copies/mL. A negative result does not preclude SARS-Cov-2 infection and should not be used as the sole basis for treatment or other patient management decisions. A negative result may occur with  improper specimen collection/handling, submission of specimen other than nasopharyngeal swab, presence of viral mutation(s) within the areas targeted by this assay, and inadequate number of viral copies(<138 copies/mL). A negative result must be combined with clinical observations, patient history, and epidemiological information. The expected result is Negative.  Fact Sheet for Patients:  BloggerCourse.com  Fact Sheet for Healthcare Providers:  SeriousBroker.it  This test is no t yet approved or cleared by the [place] FDA and  has been authorized for detection and/or diagnosis of SARS-CoV-2 by FDA under an Emergency Use Authorization (EUA). This EUA will remain  in effect (meaning this test can be used) for the duration of the COVID-19 declaration under Section 564(b)(1) of the Act, 21 U.S.C.section 360bbb-3(b)(1), unless the authorization is terminated  or revoked sooner.       Influenza A by PCR NEGATIVE NEGATIVE Final   Influenza B by PCR NEGATIVE NEGATIVE Final    Comment: (NOTE) The Xpert Xpress SARS-CoV-2/FLU/RSV plus assay is intended as an aid in the diagnosis of influenza from Nasopharyngeal swab specimens and should not be used  as a sole basis for treatment. Nasal washings and aspirates are unacceptable for Xpert Xpress SARS-CoV-2/FLU/RSV testing.  Fact Sheet for Patients: BloggerCourse.com  Fact Sheet for Healthcare Providers: SeriousBroker.it  This test is not yet approved or cleared by the [place] FDA and has been authorized for detection and/or diagnosis of SARS-CoV-2 by FDA under an Emergency Use Authorization (EUA). This EUA will remain in effect (meaning this test can be used) for the duration of the COVID-19 declaration under Section 564(b)(1) of the Act, 21 U.S.C. section 360bbb-3(b)(1), unless the authorization is terminated or revoked.  Resp Syncytial Virus by PCR NEGATIVE NEGATIVE Final    Comment: (NOTE) Fact Sheet for Patients: BloggerCourse.com  Fact Sheet for Healthcare Providers: SeriousBroker.it  This test is not yet approved or cleared by the [place] FDA and has been authorized for detection and/or diagnosis of SARS-CoV-2 by FDA under an Emergency Use Authorization (EUA). This EUA will remain in effect (meaning this test can be used) for the duration of the COVID-19 declaration under Section 564(b)(1) of the Act, 21 U.S.C. section 360bbb-3(b)(1), unless the authorization is terminated or revoked.  Performed at [facility], [address] [place], [place] [address]     Radiology Studies: No results found.    Meryn Sarracino T. Yardley [facility]  If 7PM-7AM, please contact night-coverage [email or web address] 09/27/2023, 3:50 PM

## 2023-09-28 ENCOUNTER — Encounter (HOSPITAL_COMMUNITY): Payer: Self-pay | Admitting: Gastroenterology

## 2023-09-28 DIAGNOSIS — M0579 Rheumatoid arthritis with rheumatoid factor of multiple sites without organ or systems involvement: Secondary | ICD-10-CM | POA: Diagnosis not present

## 2023-09-28 DIAGNOSIS — I10 Essential (primary) hypertension: Secondary | ICD-10-CM | POA: Diagnosis not present

## 2023-09-28 DIAGNOSIS — R195 Other fecal abnormalities: Secondary | ICD-10-CM

## 2023-09-28 DIAGNOSIS — I480 Paroxysmal atrial fibrillation: Secondary | ICD-10-CM | POA: Diagnosis not present

## 2023-09-28 DIAGNOSIS — K922 Gastrointestinal hemorrhage, unspecified: Secondary | ICD-10-CM | POA: Diagnosis not present

## 2023-09-28 DIAGNOSIS — M3219 Other organ or system involvement in systemic lupus erythematosus: Secondary | ICD-10-CM | POA: Diagnosis not present

## 2023-09-28 DIAGNOSIS — D62 Acute posthemorrhagic anemia: Secondary | ICD-10-CM | POA: Diagnosis not present

## 2023-09-28 DIAGNOSIS — Z87448 Personal history of other diseases of urinary system: Secondary | ICD-10-CM | POA: Diagnosis not present

## 2023-09-28 LAB — RENAL FUNCTION PANEL
Albumin: 3 g/dL — ABNORMAL LOW (ref 3.5–5.0)
Anion gap: 7 (ref 5–15)
BUN: 12 mg/dL (ref 8–23)
CO2: 19 mmol/L — ABNORMAL LOW (ref 22–32)
Calcium: 7.4 mg/dL — ABNORMAL LOW (ref 8.9–10.3)
Chloride: 107 mmol/L (ref 98–111)
Creatinine, Ser: 0.89 mg/dL (ref 0.44–1.00)
GFR, Estimated: >60 mL/min (ref >60)
Glucose, Bld: 80 mg/dL (ref 70–99)
Phosphorus: 2.6 mg/dL (ref 2.5–4.6)
Potassium: 3.7 mmol/L (ref 3.5–5.1)
Sodium: 133 mmol/L — ABNORMAL LOW (ref 135–145)

## 2023-09-28 LAB — CBC
HCT: 30.6 % — ABNORMAL LOW (ref 36.0–46.0)
Hemoglobin: 9.5 g/dL — ABNORMAL LOW (ref 12.0–15.0)
MCH: 31.3 pg (ref 26.0–34.0)
MCHC: 31 g/dL (ref 30.0–36.0)
MCV: 100.7 fL — ABNORMAL HIGH (ref 80.0–100.0)
Platelets: 294 10*3/uL (ref 150–400)
RBC: 3.04 MIL/uL — ABNORMAL LOW (ref 3.87–5.11)
RDW: 15.6 % — ABNORMAL HIGH (ref 11.5–15.5)
WBC: 5.5 10*3/uL (ref 4.0–10.5)
nRBC: 0 % (ref 0.0–0.2)

## 2023-09-28 LAB — MAGNESIUM: Magnesium: 2.1 mg/dL (ref 1.7–2.4)

## 2023-09-28 MED ORDER — PANTOPRAZOLE SODIUM 40 MG PO TBEC: DELAYED_RELEASE_TABLET | ORAL | 0 refills | Status: DC

## 2023-09-28 MED ORDER — APIXABAN 5 MG PO TABS
5.0000 mg | ORAL_TABLET | Freq: Two times a day (BID) | ORAL | Status: DC
  Administered 2023-09-28: 5 mg via ORAL
  Filled 2023-09-28: qty 1

## 2023-09-28 NOTE — Plan of Care (Signed)

## 2023-09-28 NOTE — TOC Transition Note (Signed)
 Transition of Care [facility]) - Discharge Note   Patient Details  Name: Hannah [name] MRN: [id number] Date of Birth: 1946-09-20  Transition of Care [facility]) CM/SW Contact:  Bascom Service, RN Phone Number: 09/28/2023, 10:52 AM   Clinical Narrative:Has otpt PT appts already scheduled [facility] [address]. No further CM needs.      Final next level of care: OP Rehab Barriers to Discharge: No Barriers Identified   Patient Goals and CMS Choice Patient states their goals for this hospitalization and ongoing recovery are:: home CMS Medicare.gov Compare Post Acute Care list provided to:: Patient   [place] ownership interest in [facility].provided to:: Patient    Discharge Placement                       Discharge Plan and Services Additional resources added to the After Visit Summary for     Discharge Planning Services: CM Consult                                 Social Drivers of Health (SDOH) Interventions SDOH Screenings   Food Insecurity: No Food Insecurity (09/25/2023)  Housing: Low Risk  (09/25/2023)  Transportation Needs: No Transportation Needs (09/25/2023)  Utilities: Not At Risk (09/25/2023)  Alcohol  Screen: Low Risk  (06/29/2023)  Depression (PHQ2-9): Medium Risk (06/30/2023)  Financial Resource Strain: Low Risk  (06/29/2023)  Physical Activity: Sufficiently Active (06/29/2023)  Social Connections: Moderately Integrated (09/27/2023)  Stress: No Stress Concern Present (06/29/2023)  Tobacco Use: Low Risk  (09/27/2023)  Health Literacy: Adequate Health Literacy (06/30/2023)     Readmission Risk Interventions     No data to display

## 2023-09-28 NOTE — Discharge Summary (Signed)
 Physician Discharge Summary  Hannah [name] FMW:[id number] DOB: 09-22-1946 DOA: 09/23/2023  PCP: Jarold Medici, MD  Admit date: 09/23/2023 Discharge date: 09/28/2023 Admitted From: Home Disposition: Home Recommendations for Outpatient Follow-up:  Follow up with PCP in 1 to 2 weeks Patient has upcoming appointment with hematology/oncology in 1 week Check CBC and CMP at follow-up Please follow up on the following pending results: Biopsy from EGD  Home Health: Resume outpatient physical therapy Equipment/Devices: None  Discharge Condition: Stable CODE STATUS: Full code  Follow-up Information     Jarold Medici, MD. Schedule an appointment as soon as possible for a visit in 1 week(s).   Specialty: Internal Medicine Contact information: [address] [address] [place] [address] [phone]         [facility] [place]. Call.   Specialty: Rehabilitation Why: Has already scheduled otpt P appointments. Contact information: [address] 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  Hgb 6.2 (was 9.6 about 10 days prior).  Hemoccult positive.  Total bili 2.4.  No report of melena or hematochezia.  She had a closed right knee manipulation with minimal surrounding bruising on 12/16.  Denies NSAID use.  GI consulted.  Transfused 2 units with appropriate response.    Hemoglobin remained stable after 2 units.  EGD on 12/29 with benign-appearing esophageal stenosis, gastritis (biopsied), medium sized hiatal hernia and nonbleeding duodenal ulcer with clean ulcer base (Forrest class III).  Colonoscopy on 12/30 with few scattered diverticula in the entire colon.   On  the day of discharge, H&H remained stable.  Eliquis  resumed after discussion with GI, Dr. Kristie.  Patient tolerated regular diet.  She is discharged on p.o. Protonix  40 mg twice daily for 1 month followed by 40 mg daily then after.  Recommend repeat CBC in 1 to 2 weeks or sooner if needed.  Patient reports upcoming appointment with hematology next week.  See individual problem list below for more.   Problems addressed during this hospitalization Symptomatic acute blood loss anemia: Suspected GI blood with positive Hemoccult.  Patient is on Eliquis  for A-fib.  Denies NSAID use.  Total bili and LDH slightly elevated but normal haptoglobin.  Transfused 2 units with appropriate response.  No nutritional deficiency on anemia panel.  EGD and colonoscopy as above. Recent Labs    07/13/23 0327 09/13/23 1600 09/23/23 1606 09/24/23 0614 09/24/23 1524 09/24/23 2332 09/25/23 0636 09/26/23 0445 09/27/23 0432 09/28/23 0434  HGB 8.8* 9.4* 6.2* 7.1* 9.3* 8.7* 9.1* 9.1* 8.9* 9.5*  -Eliquis  resumed after discussion with GI. -P.o. Protonix  40 mg twice daily for 1 month followed by 40 mg daily after 1 -Repeat CBC in 1 to 2 weeks -Advised to avoid NSAID   Benign esophageal stenosis/gastritis/duodenal ulcer -Continue PPI as above -Follow-up pathology from gastritis   Hyperbilirubinemia: Hemolysis?  LDH is slightly elevated but normal haptoglobin.  Improved. -Check CMP in 1 to 2 weeks   Paroxysmal atrial fibrillation: Rate controlled. -Continue home metoprolol  and Eliquis    CKD-3A: Ruled out.  GFR > 60.   RA: Stable -Cont plaquenil .   Essential hypertension: Normotensive -Continue home meds   Recent right TKA: Noted some swelling and old bruising.  Some limited ROM.  Working with therapy. -PT/OT and ROM exercise-PT recommended outpatient follow-up   Obesity Body mass index is 31.98 kg/m.             Time spent 35 minutes  Vital signs Vitals:   09/27/23 1506 09/27/23 1510 09/27/23  1532 09/28/23 0336  BP: (!) 150/65 (!) 157/67 129/71 126/66  Pulse: (!) 58 (!) 58 63 65  Temp: (!) 97.5 F (36.4 C)  97.7 F (36.5 C) 98.3 F (36.8 C)  Resp: (!) 21 19 17 18   Height:      Weight:      SpO2: 100% 98% 99% 99%  TempSrc: Temporal  Oral Oral  BMI (Calculated):         Discharge exam  GENERAL: No apparent distress.  Nontoxic. HEENT: MMM.  Vision and hearing grossly intact.  NECK: Supple.  No apparent JVD.  RESP:  No IWOB.  Fair aeration bilaterally. CVS:  RRR. Heart sounds normal.  ABD/GI/GU: BS+. Abd soft, NTND.  MSK/EXT:  Moves extremities. S/p recent right TKA and remote left TKA. SKIN: no apparent skin lesion or wound NEURO: Awake and alert. Oriented appropriately.  No apparent focal neuro deficit. PSYCH: Calm. Normal affect.   Discharge Instructions Discharge Instructions     Diet - low sodium heart healthy   Complete by: As directed    Discharge instructions   Complete by: As directed    It has been a pleasure taking care of you!  You were hospitalized due to gastrointestinal bleeding for which you had endoscopy and colonoscopy.  Endoscopy showed gastritis (inflammation of the stomach) and duodenal ulcer.  We are discharging you on Protonix .  Please take your medications as prescribed.  Follow-up with your primary care doctor in 1 to 2 weeks or sooner if needed.  Follow-up with your gastroenterologist per their recommendation.  Avoid any over-the-counter pain medication other than plain Tylenol .   Take care,   Increase activity slowly   Complete by: As directed       Allergies as of 09/28/2023       Reactions   Shellfish Allergy  Anaphylaxis, Hives        Medication List     STOP taking these medications    ondansetron  4 MG tablet Commonly known as: ZOFRAN        TAKE these medications    Accu-Chek Guide test strip Generic drug: glucose blood USE TO CHECK BLOOD SUGAR DAILY   Accu-Chek Softclix Lancets lancets Use to check blood  sugars daily E11.69   albuterol  108 (90 Base) MCG/ACT inhaler Commonly known as: VENTOLIN  HFA Inhale 2 puffs into the lungs every 6 (six) hours as needed for wheezing or shortness of breath.   Alcohol  Pads 70 % Pads Use as directed to check blood sugars 1 time per day dx: e11.22   allopurinol  100 MG tablet Commonly known as: Zyloprim  Take 1 tablet (100 mg total) by mouth 3 (three) times a week for 90 doses. What changed: when to take this   amLODipine  5 MG tablet Commonly known as: NORVASC  Take 1 tablet (5 mg total) by mouth at bedtime.   apixaban  5 MG Tabs tablet Commonly known as: ELIQUIS  Take 1 tablet (5 mg total) by mouth 2 (two) times daily.   atorvastatin  80 MG tablet Commonly known as: LIPITOR TAKE 1 TABLET BY MOUTH EVERY DAY   B-complex with vitamin C tablet Take 1 tablet by mouth daily with lunch.   budesonide -formoterol  160-4.5 MCG/ACT inhaler Commonly known  as: SYMBICORT  INHALE 2 PUFFS BY MOUTH TWICE DAILY IN THE MORNING AND IN THE EVENING What changed:  how much to take how to take this when to take this reasons to take this additional instructions   Calcium  Carb-Cholecalciferol 600-800 MG-UNIT Tabs Take 1 tablet by mouth daily with lunch.   hydroxychloroquine  200 MG tablet Commonly known as: PLAQUENIL  TAKE 1 TABLET BY MOUTH IN THE MORNING AND 1/2 TABLET IN THE EVENING MONDAY-FRIDAY ONLY   irbesartan  300 MG tablet Commonly known as: AVAPRO  TAKE 1 TABLET(300 MG) BY MOUTH DAILY What changed: See the new instructions.   Jardiance  10 MG Tabs tablet Generic drug: empagliflozin  Take 1 tablet (10 mg total) by mouth in the morning.   metoprolol  succinate 25 MG 24 hr tablet Commonly known as: TOPROL -XL TAKE 1 TABLET(25 MG) BY MOUTH DAILY What changed: See the new instructions.   multivitamin with minerals Tabs tablet Take 1 tablet by mouth daily with lunch. Women's Multivitamin   pantoprazole  40 MG tablet Commonly known as: Protonix  Take 1 tablet  (40 mg total) by mouth 2 (two) times daily for 30 days, THEN 1 tablet (40 mg total) daily. Start taking on: September 28, 2023   Prolia  60 MG/ML Sosy injection Generic drug: denosumab  Inject 60 mg into the skin every 6 (six) months. Courier to rheum: [address], [place] [address]. Appt on 12/07/2022   Synthroid  88 MCG tablet Generic drug: levothyroxine  TAKE 1 TABLET BY MOUTH EVERY DAY MONDAY TO SATURDAY AND OFF ON SUNDAYS What changed:  how much to take how to take this when to take this additional instructions   Systane 0.4-0.3 % Soln Generic drug: Polyethyl Glycol-Propyl Glycol Place 1-2 drops into both eyes 3 (three) times daily as needed (dry/irritated eyes.).   VITAMIN C PO Take 500 mg by mouth daily with lunch.   VITAMIN D  PO Take 2,000 Units by mouth daily with lunch.        Consultations: Gastroenterology  Procedures/Studies: 12/29-EGD with benign-appearing esophageal stenosis, gastritis (biopsied), medium sized hiatal hernia and nonbleeding duodenal ulcer with clean ulcer base (Forrest class III).    12/30-colonoscopy with few scattered diverticula in the entire examined colon   No results found.     The results of significant diagnostics from this hospitalization (including imaging, microbiology, ancillary and laboratory) are listed below for reference.     Microbiology: Recent Results (from the past 240 hours)  Resp panel by RT-PCR (RSV, Flu A&B, Covid) Anterior Nasal Swab     Status: None   Collection Time: 09/23/23  3:42 PM   Specimen: Anterior Nasal Swab  Result Value Ref Range Status   SARS Coronavirus 2 by RT PCR NEGATIVE NEGATIVE Final    Comment: (NOTE) SARS-CoV-2 target nucleic acids are NOT DETECTED.  The SARS-CoV-2 RNA is generally detectable in upper respiratory specimens during the acute phase of infection. The lowest concentration of SARS-CoV-2 viral copies this assay can detect is 138 copies/mL. A negative result  does not preclude SARS-Cov-2 infection and should not be used as the sole basis for treatment or other patient management decisions. A negative result may occur with  improper specimen collection/handling, submission of specimen other than nasopharyngeal swab, presence of viral mutation(s) within the areas targeted by this assay, and inadequate number of viral copies(<138 copies/mL). A negative result must be combined with clinical observations, patient history, and epidemiological information. The expected result is Negative.  Fact Sheet for Patients:  bloggercourse.com  Fact Sheet for Healthcare Providers:  seriousbroker.it  This test is no t yet approved or cleared by the [place]  FDA and  has been authorized for detection and/or diagnosis of SARS-CoV-2 by FDA under an Emergency Use Authorization (EUA). This EUA will remain  in effect (meaning this test can be used) for the duration of the COVID-19 declaration under Section 564(b)(1) of the Act, 21 U.S.C.section 360bbb-3(b)(1), unless the authorization is terminated  or revoked sooner.       Influenza A by PCR NEGATIVE NEGATIVE Final   Influenza B by PCR NEGATIVE NEGATIVE Final    Comment: (NOTE) The Xpert Xpress SARS-CoV-2/FLU/RSV plus assay is intended as an aid in the diagnosis of influenza from Nasopharyngeal swab specimens and should not be used as a sole basis for treatment. Nasal washings and aspirates are unacceptable for Xpert Xpress SARS-CoV-2/FLU/RSV testing.  Fact Sheet for Patients: bloggercourse.com  Fact Sheet for Healthcare Providers: seriousbroker.it  This test is not yet approved or cleared by the [place]  FDA and has been authorized for detection and/or diagnosis of SARS-CoV-2 by FDA under an Emergency Use Authorization (EUA). This EUA will remain in effect (meaning this test can be used) for  the duration of the COVID-19 declaration under Section 564(b)(1) of the Act, 21 U.S.C. section 360bbb-3(b)(1), unless the authorization is terminated or revoked.     Resp Syncytial Virus by PCR NEGATIVE NEGATIVE Final    Comment: (NOTE) Fact Sheet for Patients: bloggercourse.com  Fact Sheet for Healthcare Providers: seriousbroker.it  This test is not yet approved or cleared by the [place]  FDA and has been authorized for detection and/or diagnosis of SARS-CoV-2 by FDA under an Emergency Use Authorization (EUA). This EUA will remain in effect (meaning this test can be used) for the duration of the COVID-19 declaration under Section 564(b)(1) of the Act, 21 U.S.C. section 360bbb-3(b)(1), unless the authorization is terminated or revoked.  Performed at [facility], [address] [place], [place] [address]      Labs:  CBC: Recent Labs  Lab 09/23/23 1606 09/24/23 9385 09/24/23 1524 09/24/23 2332 09/25/23 0636 09/26/23 0445 09/27/23 0432 09/28/23 0434  WBC 10.7* 8.6  --   --  7.1 6.3 6.0 5.5  NEUTROABS 9.1*  --   --   --   --   --   --   --   HGB 6.2* 7.1*   < > 8.7* 9.1* 9.1* 8.9* 9.5*  HCT 20.1* 21.9*   < > 26.5* 27.7* 28.6* 27.8* 30.6*  MCV 100.5* 99.1  --   --  94.9 98.6 98.6 100.7*  PLT 381 307  --   --  321 305 307 294   < > = values in this interval not displayed.   BMP &GFR Recent Labs  Lab 09/24/23 0614 09/25/23 0636 09/26/23 0445 09/27/23 0432 09/28/23 0434  NA 136 135 136 138 133*  K 4.3 3.9 4.0 3.8 3.7  CL 104 105 105 109 107  CO2 22 21* 22 20* 19*  GLUCOSE 69* 98 86 85 80  BUN 24* 15 15 12 12   CREATININE 1.19* 1.07* 1.06* 0.96 0.89  CALCIUM  8.3* 8.1* 8.0* 7.7* 7.4*  MG  --  2.2 2.1 2.1 2.1  PHOS  --  2.6 2.8 2.7 2.6   Estimated Creatinine Clearance: 43 mL/min (by C-G formula based on SCr of 0.89 mg/dL). Liver & Pancreas: Recent Labs  Lab 09/23/23 1606  09/24/23 9385 09/25/23 0636 09/26/23 0445 09/27/23 0432 09/28/23 0434  AST 33 28  --   --   --   --  ALT 30 21  --   --   --   --   ALKPHOS 66 53  --   --   --   --   BILITOT 2.4* 1.8*  --   --   --   --   PROT 7.0 5.9*  --   --   --   --   ALBUMIN 3.7 3.1* 3.1* 3.0* 3.0* 3.0*   No results for input(s): LIPASE, AMYLASE in the last 168 hours. No results for input(s): AMMONIA in the last 168 hours. Diabetic: No results for input(s): HGBA1C in the last 72 hours. Recent Labs  Lab 09/26/23 0801 09/27/23 1340 09/27/23 1512  GLUCAP 89 76 127*   Cardiac Enzymes: No results for input(s): CKTOTAL, CKMB, CKMBINDEX, TROPONINI in the last 168 hours. No results for input(s): PROBNP in the last 8760 hours. Coagulation Profile: Recent Labs  Lab 09/23/23 1800  INR 1.9*   Thyroid  Function Tests: No results for input(s): TSH, T4TOTAL, FREET4, T3FREE, THYROIDAB in the last 72 hours. Lipid Profile: No results for input(s): CHOL, HDL, LDLCALC, TRIG, CHOLHDL, LDLDIRECT in the last 72 hours. Anemia Panel: No results for input(s): VITAMINB12, FOLATE, FERRITIN, TIBC, IRON , RETICCTPCT in the last 72 hours. Urine analysis:    Component Value Date/Time   COLORURINE YELLOW 09/23/2023 1606   APPEARANCEUR HAZY (A) 09/23/2023 1606   LABSPEC 1.014 09/23/2023 1606   PHURINE 5.0 09/23/2023 1606   GLUCOSEU >=500 (A) 09/23/2023 1606   HGBUR NEGATIVE 09/23/2023 1606   BILIRUBINUR NEGATIVE 09/23/2023 1606   BILIRUBINUR Negative 01/12/2023 1629   KETONESUR 5 (A) 09/23/2023 1606   PROTEINUR 30 (A) 09/23/2023 1606   UROBILINOGEN 0.2 01/12/2023 1629   NITRITE NEGATIVE 09/23/2023 1606   LEUKOCYTESUR SMALL (A) 09/23/2023 1606   Sepsis Labs: Invalid input(s): PROCALCITONIN, LACTICIDVEN   SIGNED:  Lola Czerwonka T Hannah Barz, MD  [facility] 09/28/2023, 3:02 PM

## 2023-09-30 ENCOUNTER — Encounter (HOSPITAL_COMMUNITY): Payer: Self-pay | Admitting: Gastroenterology

## 2023-09-30 LAB — SURGICAL PATHOLOGY

## 2023-09-30 NOTE — Telephone Encounter (Signed)
 Pt called in. She was in the hospital when you sent mychart message and she said she tried to call your number but was unable to get through. She asked if you could call her back, she has some additional questions.

## 2023-10-01 ENCOUNTER — Other Ambulatory Visit (HOSPITAL_COMMUNITY): Payer: Self-pay

## 2023-10-01 ENCOUNTER — Encounter: Payer: Self-pay | Admitting: Physical Therapy

## 2023-10-01 ENCOUNTER — Ambulatory Visit: Payer: Medicare HMO | Attending: Internal Medicine | Admitting: Physical Therapy

## 2023-10-01 DIAGNOSIS — R262 Difficulty in walking, not elsewhere classified: Secondary | ICD-10-CM | POA: Insufficient documentation

## 2023-10-01 DIAGNOSIS — M6281 Muscle weakness (generalized): Secondary | ICD-10-CM | POA: Insufficient documentation

## 2023-10-01 DIAGNOSIS — G8929 Other chronic pain: Secondary | ICD-10-CM | POA: Diagnosis not present

## 2023-10-01 DIAGNOSIS — M25561 Pain in right knee: Secondary | ICD-10-CM | POA: Insufficient documentation

## 2023-10-01 NOTE — Therapy (Signed)
 OUTPATIENT PHYSICAL THERAPY LOWER EXTREMITY TREATMENT   Patient Name: Hannah [name] MRN: [id number] DOB:1945/12/24, 78 y.o., female Today's Date: 10/01/2023  END OF SESSION:  PT End of Session - 10/01/23 1314     Visit Number 16    Number of Visits 17    Date for PT Re-Evaluation 10/22/23    Authorization Type [employer]    Authorization Time Period Approved 12 visits 09/06/23-10/23/23    Authorization - Visit Number 3    Authorization - Number of Visits 12    PT Start Time 1315    PT Stop Time 1356    PT Time Calculation (min) 41 min                         Past Medical History:  Diagnosis Date   Allergy  25 years ago.   Anemia    Arthritis    Asthma    Atrial fibrillation (HCC)    Cataract Had cataracts surgery 2015 & 2016   Chronic kidney disease    Diabetes mellitus without complication (HCC)    Gout 12/17/2014   patient reported   Heart murmur 4 years ago   Hyperlipidemia    Hypertension    Hypothyroidism    Systemic lupus erythematosus (HCC)    Vitiligo    Past Surgical History:  Procedure Laterality Date   BIOPSY  09/26/2023   Procedure: BIOPSY;  Surgeon: Aneita Gwendlyn DASEN, MD;  Location: [facility];  Service: Gastroenterology;;   CATARACT EXTRACTION Bilateral 2015   COLONOSCOPY WITH PROPOFOL  N/A 09/27/2023   Procedure: COLONOSCOPY WITH PROPOFOL ;  Surgeon: Kristie Lamprey, MD;  Location: [facility];  Service: Gastroenterology;  Laterality: N/A;   DILATION AND CURETTAGE OF UTERUS     DOPPLER ECHOCARDIOGRAPHY  05/2018   Internist to review with pt; potential heart murmur 06/20/18   ESOPHAGOGASTRODUODENOSCOPY (EGD) WITH PROPOFOL  N/A 09/26/2023   Procedure: ESOPHAGOGASTRODUODENOSCOPY (EGD) WITH PROPOFOL ;  Surgeon: Aneita Gwendlyn DASEN, MD;  Location: [facility];  Service: Gastroenterology;  Laterality: N/A;   EYE SURGERY  Cataract Surgery, 2015 & 2016.   keratosis removal  2021   KNEE CLOSED REDUCTION Left 07/06/2022   Procedure:  CLOSED MANIPULATION KNEE;  Surgeon: Melodi Lerner, MD;  Location: [facility];  Service: Orthopedics;  Laterality: Left;   KNEE CLOSED REDUCTION Right 09/13/2023   Procedure: CLOSED MANIPULATION KNEE;  Surgeon: Melodi Lerner, MD;  Location: [facility];  Service: Orthopedics;  Laterality: Right;   REPLACEMENT TOTAL KNEE Left    SKIN SURGERY  11/30/2018   left side of face   TOOTH EXTRACTION     TOTAL KNEE ARTHROPLASTY Left 02/02/2022   Procedure: TOTAL KNEE ARTHROPLASTY;  Surgeon: Melodi Lerner, MD;  Location: [facility];  Service: Orthopedics;  Laterality: Left;   TOTAL KNEE ARTHROPLASTY Right 07/12/2023   Procedure: RIGHT TOTAL KNEE ARTHROPLASTY;  Surgeon: Melodi Lerner, MD;  Location: [facility];  Service: Orthopedics;  Laterality: Right;   Patient Active Problem List   Diagnosis Date Noted   Occult blood detected in feces by immunoassay 09/26/2023   ABLA (acute blood loss anemia) 09/23/2023   GI bleed 09/23/2023   Pure hypercholesterolemia 07/18/2023   Personal history of COVID-19 07/18/2023   Primary osteoarthritis of right knee 07/12/2023   Acquired thrombophilia (HCC) 01/12/2023   Arthrofibrosis of knee joint, right 07/06/2022   OA (osteoarthritis) of knee 02/02/2022   Primary osteoarthritis of left knee 02/02/2022   Chronic bilateral low back pain without sciatica 06/25/2021  Paresthesia and pain of extremity 06/25/2021   Atrial fibrillation (HCC) 04/16/2021   Demand ischemia (HCC) 03/26/2021   Unspecified atrial fibrillation (HCC) 03/26/2021   COVID-19 virus infection 03/26/2021   PVC (premature ventricular contraction) 05/06/2020   Type 2 diabetes mellitus with stage 3 chronic kidney disease, without long-term current use of insulin  (HCC) 03/20/2020   Palpitations 03/20/2020   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   Primary hypothyroidism 03/20/2020   Vitiligo 03/20/2020   Class 2 severe obesity due to excess calories with serious comorbidity and body mass index (BMI) of  35.0 to 35.9 in adult (HCC) 10/31/2019   Iron  deficiency anemia 08/12/2017   Osteoporosis 10/02/2016   Systemic lupus erythematosus (HCC) 10/02/2016   Rheumatoid arthritis involving multiple sites with positive rheumatoid factor (HCC) 10/02/2016   High risk medication use 10/02/2016   History of chronic kidney disease 10/02/2016   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/2016   Primary osteoarthritis of both knees 10/02/2016   Vitamin D  deficiency 10/02/2016   History of diabetes mellitus 10/02/2016   History of hypertension 10/02/2016   History of asthma 10/02/2016   Absolute anemia    MGUS (monoclonal gammopathy of unknown significance)    Normocytic anemia 03/21/2015   Essential hypertension 03/20/2015   Abnormal CT of the chest 07/30/2012   Asthma 06/19/2012    PCP: Jarold Medici, MD  REFERRING PROVIDER: Melodi Lerner, MD   REFERRING DIAG: M17.11 (ICD-10-CM) - Unilateral primary osteoarthritis, right knee   THERAPY DIAG:  Chronic pain of right knee  Muscle weakness (generalized)  Difficulty in walking, not elsewhere classified  Rationale for Evaluation and Treatment: Rehabilitation  ONSET DATE: 07/12/23  SUBJECTIVE:   SUBJECTIVE STATEMENT: 10/01/23: Pt reports that she was hospitalized for one week due to internal bleeding. She was seen by PT in the hospital who helped her with her knee exercises and walked with her. Released from hospital 3 days ago (Tuesday). Has not heard anything about JAS brace.   09/14/23: Pt reports she underwent a knee manipulation yesterday. Pt states Dr. Melodi us  going to order a JAS knee brace. Pt reports she completes her ROM exs at home 4x daily.  EVAL: Pt reports she has been completing her HEP 1x daily and using cold packs on a frequent basis for pain and swelling management.  PAIN:  Are you having pain? Yes: NPRS scale: 5/10; pain range 5-10/10 Pain location: R knee Pain description: ache ,throb  ,sharp Aggravating factors: movement, lifting, wt bearing Relieving factors: Pain medication, muscle relaxer, cold packs  PERTINENT HISTORY: High BMI, L LKA, Afib  PRECAUTIONS: Knee  RED FLAGS: None   WEIGHT BEARING RESTRICTIONS: No  FALLS:  Has patient fallen in last 6 months? No  LIVING ENVIRONMENT: Lives with: lives alone Lives in: House/apartment Stairs: No Has following equipment at home: Single point cane, Environmental Consultant - 2 wheeled, Wheelchair (manual), and bed side commode  OCCUPATION: Retired  PLOF: Independent with household mobility with device  PATIENT GOALS: good use of my R knee  NEXT MD VISIT: 07/27/23  OBJECTIVE:  Note: Objective measures were completed at Evaluation unless otherwise noted.  DIAGNOSTIC FINDINGS: None for the R knee in Epic  PATIENT SURVEYS:  FOTO: Perceived function   22%, predicted   43%   COGNITION: Overall cognitive status: Within functional limits for tasks assessed     SENSATION: WFL  EDEMA:  Significant R knee edema present   MUSCLE LENGTH: Hamstrings: Right NT deg; Left NT deg Debby  test: Right NT deg; Left NT deg  POSTURE: rounded shoulders, forward head, and anterior pelvic tilt  PALPATION: TTP to the R peri-knee  LOWER EXTREMITY ROM:  Active ROM Right eval Left eval Rt 07/20/23 Rt 07/22/23 RT 07/27/23 Rt 07/29/23 08/03/23 Rt 08/06/23  Hip flexion          Hip extension          Hip abduction          Hip adduction          Hip internal rotation          Hip external rotation          Knee flexion 50  70 AA 80 AA75 AA85 AA 72 AA85  Knee extension 30  30 30 23  25d  30  Ankle dorsiflexion          Ankle plantarflexion          Ankle inversion          Ankle eversion           (Blank rows = not tested) 08/11/23:30-85d. 08/19/23=AAROM 30-90d. 08/31/23=AA90d   09/02/23=AA 30-85d. 09/08/23=AA30-90. 09/14/23=AA34-80: 10/01/23: 28-90 AA (end of session seated)   LOWER EXTREMITY MMT:  MMT Right eval Left eval  RT 07/27/23 Rt 09/02/23  Hip flexion 2  3 with limited ROM 4+  Hip extension 2   4+  Hip abduction 2   4+  Hip adduction      Hip internal rotation      Hip external rotation 2   4+  Knee flexion 2   4+ with limited ROM  Knee extension 2  3 with limited ROM 4+ with limited ROM  Ankle dorsiflexion      Ankle plantarflexion      Ankle inversion      Ankle eversion       (Blank rows = not tested)  LOWER EXTREMITY SPECIAL TESTS:  NT  FUNCTIONAL TESTS:  5 times sit to stand: TBA 2 minute walk test: TBA  5xSTS=24.7 c arm rest  2MWT=78' c RW GAIT: Distance walked: 70ft Assistive device utilized: Environmental Consultant - 2 wheeled Level of assistance: SBA Comments: Antalgic over the R LE  TRANSFER: Mod A for mat table transfer c A for the R LE  TODAY'S TREATMENT:  [facility] Adult PT Treatment:                                                DATE: 10/01/23 Therapeutic Exercise: Nustep L5 UE/LE x 6 minutes  LAQ 5# 10 x 2  Seated h/s curls red band x 20 Hamstring stretch seated with strap- foot on stool  Seated scoot stretch for knee flexion  Manual Therapy: Seated strap mobs to increased knee ext followed by PROM Seated A/P tibia mobs to increased flexion, followed by PROM    [facility] Adult PT Treatment:                                                DATE: 09/14/23 Therapeutic Exercise: Nustep L5 x 6 minutes UE/LE Seated knee flexion x5 20 pt over pressure  Supine knee flexion c strap and PT over pressure Supine knee ext c PT over pressure Manual  Therapy: Patella mobs all planes  A/P and P/A fem/tib grade 3 mobs  Contract/relax knee flexion and ext Self Care: Pt is to focus on R knee ROM exs from her HEP Measurements for JAS knee brace  [facility] Adult PT Treatment:                                                DATE: 09/07/23 Therapeutic Exercise: Nustep L5 x 6 minutes UE/LE LAQ 2x10 5# Standing hip abd 2x10 5# each Standing hip ext 2x10 5# each Standing knee lifts 2x10 5# each Standing leg  curls 2x10 5# R Standing hip ext 2x10 5# each Seated knee flexion x5 20 pt over pressure  Supine knee flexion c strap and PT over pressure Manual Therapy: Supine PROM knee ext  Patella mobs all planes  A/P and P/A fem/tib grade 3 mobs   [facility] Adult PT Treatment:                                                DATE: 09/02/23 Therapeutic Exercise: Seated knee flexion/ext x10 on towel c 20 pt over pressure each direction Supine knee ext on transfer board x10 c 20 over pressure by PT Supine knee flexion on transfer board x10 Supine knee flexion on transfer board x10 c pt over pressure with strap 20 SLR 2x10 5# S/L hip abd 2x10 3# SAQ 2x10 5# MMT  PATIENT EDUCATION:  Education details: Eval findings, POC, HEP, self care  Person educated: Patient Education method: Explanation, Demonstration, Tactile cues, Verbal cues, and Handouts Education comprehension: verbalized understanding, returned demonstration, verbal cues required, and tactile cues required  HOME EXERCISE PROGRAM: Access Code: [email or web address] URL: [email or web address] Date: 08/19/2023 Prepared by: Dasie Daft  Exercises - Long Sitting Quad Set (Mirrored)  - 3 x daily - 7 x weekly - 1 sets - 10 reps - 5 hold - Supine Heel Slide with Strap  - 3 x daily - 7 x weekly - 1 sets - 10 reps - 10 hold - Supine Knee Extension Strengthening (Mirrored)  - 3 x daily - 7 x weekly - 1 sets - 10 reps - 3 hold - Supine Hip Abduction AROM (Mirrored)  - 3 x daily - 7 x weekly - 1 sets - 10 reps - 3 hold - Seated Knee Flexion Slide  - 3 x daily - 7 x weekly - 1 sets - 10 reps - 10 hold - Active Straight Leg Raise with Quad Set  - 3 x daily - 7 x weekly - 1 sets - 10 reps - Seated Knee Extension Stretch with Chair  - 3 x daily - 7 x weekly - 1 sets  ASSESSMENT:  CLINICAL IMPRESSION: 10/01/23: Pt reports recent hospitalization. Was able to work with PT in the hospital. Beginning of session pt 30-85, end of session improved to 28-90  AAROM seated which is improved from slightly for extension compared to pre manipulation.  Manual techniques used to increased ROM as well as passive stretching. Last communication with JAS rep on Dec 18th indicated that all patient information had been received. Will reach out for status on JAS brace.  Pt tolerated PT today without adverse effects.   Eval: Patient is a 78 y.o.  female who was seen today for physical therapy evaluation and treatment for M17.11 (ICD-10-CM) - Unilateral primary osteoarthritis, right knee . RIGHT TOTAL KNEE ARTHROPLASTY on 07/12/2023. Pt presents with markedly decreased R knee AROM and strength, and decreased functional mobility. Pt will benefit from skilled PT 2w8 to address impairments to optimize R LE/knee function with less pain.   OBJECTIVE IMPAIRMENTS: decreased activity tolerance, decreased mobility, difficulty walking, decreased ROM, decreased strength, increased edema, obesity, and pain.   ACTIVITY LIMITATIONS: carrying, lifting, bending, sitting, standing, squatting, sleeping, stairs, transfers, bed mobility, bathing, toileting, dressing, locomotion level, and caring for others  PARTICIPATION LIMITATIONS: meal prep, cleaning, laundry, and driving  PERSONAL FACTORS: Age, Fitness, Past/current experiences, Time since onset of injury/illness/exacerbation, and 1 comorbidity: high BMI  are also affecting patient's functional outcome.   REHAB POTENTIAL: Good  CLINICAL DECISION MAKING: Stable/uncomplicated  EVALUATION COMPLEXITY: Low   GOALS:  SHORT TERM GOALS: Target date: 08/06/23 Pt will be Ind in an initial HEP  Baseline: started Goal status: MET  2.  Pt will be able to complete a SLR and SAQ with or without quad lag Baseline: not able to complete 07/29/23: able to complete both with limited knee ext ROM present Goal status: MET  3.  Increase R knee AROM to 15-80d for improved R knee function Baseline: 30-50 10/01/23: AAROM 28-90 Goal status:  PARTIALLY MET FOR FLEXION  LONG TERM GOALS: Target date: 10/22/23  Pt will be Ind in a final HEP to maintain achieved LOF Baseline: started Goal status: ONGOING  2.  Increase R hip and knee strength to 4/5 for improved function use of the R LE Baseline: see flow sheets 09/02/23: see flow sheets Goal status: MET  3.  Increased R knee AROM to 5-115d for appropriate fuctional mobility Baseline: 30-50d 09/02/23: 30-85d Goal status: ONGOING  4.  Improve 5xSTS by MCID of 5 and by MCID of 43ft as indication of improved functional mobility  Baseline: TBA when pt has progressed to gait c a [facility] 08/31/23: 5xSTS=24.7 c arm rest      2MWT=78' c RW Goal status: ONGOING  5.  Pt's FOTO score will improved to the predicted value of 43% as indication of improved function  Baseline: 22% Goal status: ONGOING  6.  Pt will be able to walk 482ft c or s a SPC and ascend/descend 12 steps c or s a SPC and c use of a HR for community mobility Baseline: 30ft c RW Goal status: ONGOING   PLAN:  PT FREQUENCY: 2x/week  PT DURATION: 6 weeks  PLANNED INTERVENTIONS: 97164- PT Re-evaluation, 97110-Therapeutic exercises, 97530- Therapeutic activity, 97535- Self Care, [id number]- Manual therapy, [id number]- Gait training, 97014- Electrical stimulation (unattended), [id number]- Electrical stimulation (manual), 97016- Vasopneumatic device, Stair training, Dry Needling, Joint mobilization, Cryotherapy, and Moist heat  PLAN FOR NEXT SESSION: Review FOTO; assess response to HEP; progress therex as indicated; use of modalities, manual therapy; and TPDN as indicated. Check on JAS  Harlene Persons, PTA 10/01/23 2:03 PM Phone: [phone] Fax: [phone]

## 2023-10-03 NOTE — Therapy (Signed)
 OUTPATIENT PHYSICAL THERAPY LOWER EXTREMITY TREATMENT   Patient Name: Hannah [name] MRN: [id number] DOB:1946/01/28, 78 y.o., female Today's Date: 10/05/2023  END OF SESSION:  PT End of Session - 10/05/23 1416     Visit Number 17    Number of Visits 25    Date for PT Re-Evaluation 10/22/23    Authorization Type [employer]    Authorization Time Period Approved 12 visits 09/06/23-10/23/23    Authorization - Visit Number 4    Authorization - Number of Visits 12    Progress Note Due on Visit 20    PT Start Time 1415    PT Stop Time 1445    PT Time Calculation (min) 30 min    Activity Tolerance Patient tolerated treatment well    Behavior During Therapy WFL for tasks assessed/performed                          Past Medical History:  Diagnosis Date   Allergy  25 years ago.   Anemia    Arthritis    Asthma    Atrial fibrillation (HCC)    Cataract Had cataracts surgery 2015 & 2016   Chronic kidney disease    Diabetes mellitus without complication (HCC)    Gout 12/17/2014   patient reported   Heart murmur 4 years ago   Hyperlipidemia    Hypertension    Hypothyroidism    Systemic lupus erythematosus (HCC)    Vitiligo    Past Surgical History:  Procedure Laterality Date   BIOPSY  09/26/2023   Procedure: BIOPSY;  Surgeon: Aneita Gwendlyn DASEN, MD;  Location: [facility];  Service: Gastroenterology;;   CATARACT EXTRACTION Bilateral 2015   COLONOSCOPY WITH PROPOFOL  N/A 09/27/2023   Procedure: COLONOSCOPY WITH PROPOFOL ;  Surgeon: Kristie Lamprey, MD;  Location: [facility];  Service: Gastroenterology;  Laterality: N/A;   DILATION AND CURETTAGE OF UTERUS     DOPPLER ECHOCARDIOGRAPHY  05/2018   Internist to review with pt; potential heart murmur 06/20/18   ESOPHAGOGASTRODUODENOSCOPY (EGD) WITH PROPOFOL  N/A 09/26/2023   Procedure: ESOPHAGOGASTRODUODENOSCOPY (EGD) WITH PROPOFOL ;  Surgeon: Aneita Gwendlyn DASEN, MD;  Location: [facility];  Service:  Gastroenterology;  Laterality: N/A;   EYE SURGERY  Cataract Surgery, 2015 & 2016.   keratosis removal  2021   KNEE CLOSED REDUCTION Left 07/06/2022   Procedure: CLOSED MANIPULATION KNEE;  Surgeon: Melodi Lerner, MD;  Location: [facility];  Service: Orthopedics;  Laterality: Left;   KNEE CLOSED REDUCTION Right 09/13/2023   Procedure: CLOSED MANIPULATION KNEE;  Surgeon: Melodi Lerner, MD;  Location: [facility];  Service: Orthopedics;  Laterality: Right;   REPLACEMENT TOTAL KNEE Left    SKIN SURGERY  11/30/2018   left side of face   TOOTH EXTRACTION     TOTAL KNEE ARTHROPLASTY Left 02/02/2022   Procedure: TOTAL KNEE ARTHROPLASTY;  Surgeon: Melodi Lerner, MD;  Location: [facility];  Service: Orthopedics;  Laterality: Left;   TOTAL KNEE ARTHROPLASTY Right 07/12/2023   Procedure: RIGHT TOTAL KNEE ARTHROPLASTY;  Surgeon: Melodi Lerner, MD;  Location: [facility];  Service: Orthopedics;  Laterality: Right;   Patient Active Problem List   Diagnosis Date Noted   Occult blood detected in feces by immunoassay 09/26/2023   ABLA (acute blood loss anemia) 09/23/2023   GI bleed 09/23/2023   Pure hypercholesterolemia 07/18/2023   Personal history of COVID-19 07/18/2023   Primary osteoarthritis of right knee 07/12/2023   Acquired thrombophilia (HCC) 01/12/2023   Arthrofibrosis of knee  joint, right 07/06/2022   OA (osteoarthritis) of knee 02/02/2022   Primary osteoarthritis of left knee 02/02/2022   Chronic bilateral low back pain without sciatica 06/25/2021   Paresthesia and pain of extremity 06/25/2021   Atrial fibrillation (HCC) 04/16/2021   Demand ischemia (HCC) 03/26/2021   Unspecified atrial fibrillation (HCC) 03/26/2021   COVID-19 virus infection 03/26/2021   PVC (premature ventricular contraction) 05/06/2020   Type 2 diabetes mellitus with stage 3 chronic kidney disease, without long-term current use of insulin  (HCC) 03/20/2020   Palpitations 03/20/2020   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    Primary hypothyroidism 03/20/2020   Vitiligo 03/20/2020   Class 2 severe obesity due to excess calories with serious comorbidity and body mass index (BMI) of 35.0 to 35.9 in adult (HCC) 10/31/2019   Iron  deficiency anemia 08/12/2017   Osteoporosis 10/02/2016   Systemic lupus erythematosus (HCC) 10/02/2016   Rheumatoid arthritis involving multiple sites with positive rheumatoid factor (HCC) 10/02/2016   High risk medication use 10/02/2016   History of chronic kidney disease 10/02/2016   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/2016   Primary osteoarthritis of both knees 10/02/2016   Vitamin D  deficiency 10/02/2016   History of diabetes mellitus 10/02/2016   History of hypertension 10/02/2016   History of asthma 10/02/2016   Absolute anemia    MGUS (monoclonal gammopathy of unknown significance)    Normocytic anemia 03/21/2015   Essential hypertension 03/20/2015   Abnormal CT of the chest 07/30/2012   Asthma 06/19/2012    PCP: Jarold Medici, MD  REFERRING PROVIDER: Melodi Lerner, MD   REFERRING DIAG: M17.11 (ICD-10-CM) - Unilateral primary osteoarthritis, right knee   THERAPY DIAG:  Chronic pain of right knee  Muscle weakness (generalized)  Difficulty in walking, not elsewhere classified  Rationale for Evaluation and Treatment: Rehabilitation  ONSET DATE: 07/12/23  SUBJECTIVE:   SUBJECTIVE STATEMENT: 10/01/23: Pt reports that she was hospitalized for one week due to internal bleeding. She was seen by PT in the hospital who helped her with her knee exercises and walked with her. Released from hospital 3 days ago (Tuesday). Has not heard anything about JAS brace.   09/14/23: Pt reports she underwent a knee manipulation yesterday. Pt states Dr. Melodi us  going to order a JAS knee brace. Pt reports she completes her ROM exs at home 4x daily.  EVAL: Pt reports she has been completing her HEP 1x daily and using cold packs on a frequent basis for pain  and swelling management.  PAIN:  Are you having pain? Yes: NPRS scale: 5/10; pain range 5-10/10 Pain location: R knee Pain description: ache ,throb ,sharp Aggravating factors: movement, lifting, wt bearing Relieving factors: Pain medication, muscle relaxer, cold packs  PERTINENT HISTORY: High BMI, L LKA, Afib  PRECAUTIONS: Knee  RED FLAGS: None   WEIGHT BEARING RESTRICTIONS: No  FALLS:  Has patient fallen in last 6 months? No  LIVING ENVIRONMENT: Lives with: lives alone Lives in: House/apartment Stairs: No Has following equipment at home: Single point cane, Environmental Consultant - 2 wheeled, Wheelchair (manual), and bed side commode  OCCUPATION: Retired  PLOF: Independent with household mobility with device  PATIENT GOALS: good use of my R knee  NEXT MD VISIT: 07/27/23  OBJECTIVE:  Note: Objective measures were completed at Evaluation unless otherwise noted.  DIAGNOSTIC FINDINGS: None for the R knee in Epic  PATIENT SURVEYS:  FOTO: Perceived function   22%, predicted   43%   COGNITION: Overall cognitive status: Within functional  limits for tasks assessed     SENSATION: WFL  EDEMA:  Significant R knee edema present   MUSCLE LENGTH: Hamstrings: Right NT deg; Left NT deg Debby test: Right NT deg; Left NT deg  POSTURE: rounded shoulders, forward head, and anterior pelvic tilt  PALPATION: TTP to the R peri-knee  LOWER EXTREMITY ROM:  Active ROM Right eval Left eval Rt 07/20/23 Rt 07/22/23 RT 07/27/23 Rt 07/29/23 08/03/23 Rt 08/06/23  Hip flexion          Hip extension          Hip abduction          Hip adduction          Hip internal rotation          Hip external rotation          Knee flexion 50  70 AA 80 AA75 AA85 AA 72 AA85  Knee extension 30  30 30 23  25d  30  Ankle dorsiflexion          Ankle plantarflexion          Ankle inversion          Ankle eversion           (Blank rows = not tested) 08/11/23:30-85d. 08/19/23=AAROM 30-90d. 08/31/23=AA90d    09/02/23=AA 30-85d. 09/08/23=AA30-90. 09/14/23=AA34-80: 10/01/23: 28-90 AA (end of session seated). 10/05/23: 28-90 AA (end of session supine)   LOWER EXTREMITY MMT:  MMT Right eval Left eval RT 07/27/23 Rt 09/02/23  Hip flexion 2  3 with limited ROM 4+  Hip extension 2   4+  Hip abduction 2   4+  Hip adduction      Hip internal rotation      Hip external rotation 2   4+  Knee flexion 2   4+ with limited ROM  Knee extension 2  3 with limited ROM 4+ with limited ROM  Ankle dorsiflexion      Ankle plantarflexion      Ankle inversion      Ankle eversion       (Blank rows = not tested)  LOWER EXTREMITY SPECIAL TESTS:  NT  FUNCTIONAL TESTS:  5 times sit to stand: TBA 2 minute walk test: TBA  5xSTS=24.7 c arm rest  2MWT=78' c RW GAIT: Distance walked: 38ft Assistive device utilized: Environmental Consultant - 2 wheeled Level of assistance: SBA Comments: Antalgic over the R LE  TRANSFER: Mod A for mat table transfer c A for the R LE  TODAY'S TREATMENT:  [facility] Adult PT Treatment:                                                DATE: 10/05/23 Therapeutic Exercise: LAQ 5# 10 x 2  SLR 10 x 2 STS x10 c hand assist Seated h/s curls red band 10 x 2 Hamstring stretch seated with strap- foot on stool  Seated scoot stretch for knee flexion  Manual Therapy: Patella mobs all planes  A/P and P/A fem/tib grade 3 mobs  PROM for flexion and extension Therapeutic Activity: Gait training- Use of 1 ll bar to simulate walking with a SPC. Walking c [facility] CGA for safety 169ft. Increased antalgic gait pattern over 100'.  [facility] Adult PT Treatment:  DATE: 10/01/23 Therapeutic Exercise: Nustep L5 UE/LE x 6 minutes  LAQ 5# 10 x 2  Seated h/s curls red band x 20 Hamstring stretch seated with strap- foot on stool  Seated scoot stretch for knee flexion  Manual Therapy: Seated strap mobs to increased knee ext followed by PROM Seated A/P tibia mobs to increased flexion,  followed by PROM  [facility] Adult PT Treatment:                                                DATE: 09/14/23 Therapeutic Exercise: Nustep L5 x 6 minutes UE/LE Seated knee flexion x5 20 pt over pressure  Supine knee flexion c strap and PT over pressure Supine knee ext c PT over pressure Manual Therapy: Patella mobs all planes  A/P and P/A fem/tib grade 3 mobs  Contract/relax knee flexion and ext Self Care: Pt is to focus on R knee ROM exs from her HEP Measurements for JAS knee brace  [facility] Adult PT Treatment:                                                DATE: 09/07/23 Therapeutic Exercise: Nustep L5 x 6 minutes UE/LE LAQ 2x10 5# Standing hip abd 2x10 5# each Standing hip ext 2x10 5# each Standing knee lifts 2x10 5# each Standing leg curls 2x10 5# R Standing hip ext 2x10 5# each Seated knee flexion x5 20 pt over pressure  Supine knee flexion c strap and PT over pressure Manual Therapy: Supine PROM knee ext  Patella mobs all planes  A/P and P/A fem/tib grade 3 mobs   PATIENT EDUCATION:  Education details: Eval findings, POC, HEP, self care  Person educated: Patient Education method: Explanation, Demonstration, Tactile cues, Verbal cues, and Handouts Education comprehension: verbalized understanding, returned demonstration, verbal cues required, and tactile cues required  HOME EXERCISE PROGRAM: Access Code: [email or web address] URL: [email or web address] Date: 08/19/2023 Prepared by: Dasie Daft  Exercises - Long Sitting Quad Set (Mirrored)  - 3 x daily - 7 x weekly - 1 sets - 10 reps - 5 hold - Supine Heel Slide with Strap  - 3 x daily - 7 x weekly - 1 sets - 10 reps - 10 hold - Supine Knee Extension Strengthening (Mirrored)  - 3 x daily - 7 x weekly - 1 sets - 10 reps - 3 hold - Supine Hip Abduction AROM (Mirrored)  - 3 x daily - 7 x weekly - 1 sets - 10 reps - 3 hold - Seated Knee Flexion Slide  - 3 x daily - 7 x weekly - 1 sets - 10 reps - 10 hold - Active Straight  Leg Raise with Quad Set  - 3 x daily - 7 x weekly - 1 sets - 10 reps - Seated Knee Extension Stretch with Chair  - 3 x daily - 7 x weekly - 1 sets  ASSESSMENT:  CLINICAL IMPRESSION: 10/05/23: No significnat gains c R knee PROM despite PT session primarily completed to addressing ROM. Completed gait training c SPC. Pt's antalgic gait increased over the 100' completed. Recommended pt continuing to use the RW for best support, pain relief and safety. Pt voiced understanding. Pt is still waiting to hear from the JAS  rep. Pt tolerated PT today without adverse effects. Pt will continue to benefit from skilled PT to address impairments for improved R knee function.   Eval: Patient is a 78 y.o. female who was seen today for physical therapy evaluation and treatment for M17.11 (ICD-10-CM) - Unilateral primary osteoarthritis, right knee . RIGHT TOTAL KNEE ARTHROPLASTY on 07/12/2023. Pt presents with markedly decreased R knee AROM and strength, and decreased functional mobility. Pt will benefit from skilled PT 2w8 to address impairments to optimize R LE/knee function with less pain.   OBJECTIVE IMPAIRMENTS: decreased activity tolerance, decreased mobility, difficulty walking, decreased ROM, decreased strength, increased edema, obesity, and pain.   ACTIVITY LIMITATIONS: carrying, lifting, bending, sitting, standing, squatting, sleeping, stairs, transfers, bed mobility, bathing, toileting, dressing, locomotion level, and caring for others  PARTICIPATION LIMITATIONS: meal prep, cleaning, laundry, and driving  PERSONAL FACTORS: Age, Fitness, Past/current experiences, Time since onset of injury/illness/exacerbation, and 1 comorbidity: high BMI  are also affecting patient's functional outcome.   REHAB POTENTIAL: Good  CLINICAL DECISION MAKING: Stable/uncomplicated  EVALUATION COMPLEXITY: Low   GOALS:  SHORT TERM GOALS: Target date: 08/06/23 Pt will be Ind in an initial HEP  Baseline: started Goal status:  MET  2.  Pt will be able to complete a SLR and SAQ with or without quad lag Baseline: not able to complete 07/29/23: able to complete both with limited knee ext ROM present Goal status: MET  3.  Increase R knee AROM to 15-80d for improved R knee function Baseline: 30-50 10/01/23: AAROM 28-90 Goal status: PARTIALLY MET FOR FLEXION  LONG TERM GOALS: Target date: 10/22/23  Pt will be Ind in a final HEP to maintain achieved LOF Baseline: started Goal status: ONGOING  2.  Increase R hip and knee strength to 4/5 for improved function use of the R LE Baseline: see flow sheets 09/02/23: see flow sheets Goal status: MET  3.  Increased R knee AROM to 5-115d for appropriate fuctional mobility Baseline: 30-50d 09/02/23: 30-85d Goal status: ONGOING  4.  Improve 5xSTS by MCID of 5 and by MCID of 34ft as indication of improved functional mobility  Baseline: TBA when pt has progressed to gait c a [facility] 08/31/23: 5xSTS=24.7 c arm rest      2MWT=78' c RW Goal status: ONGOING  5.  Pt's FOTO score will improved to the predicted value of 43% as indication of improved function  Baseline: 22% Goal status: ONGOING  6.  Pt will be able to walk 449ft c or s a SPC and ascend/descend 12 steps c or s a SPC and c use of a HR for community mobility Baseline: 21ft c RW Goal status: ONGOING   PLAN:  PT FREQUENCY: 2x/week  PT DURATION: 6 weeks  PLANNED INTERVENTIONS: 97164- PT Re-evaluation, 97110-Therapeutic exercises, 97530- Therapeutic activity, 97535- Self Care, 02859- Manual therapy, [id number]- Gait training, 97014- Electrical stimulation (unattended), [id number]- Electrical stimulation (manual), 97016- Vasopneumatic device, Stair training, Dry Needling, Joint mobilization, Cryotherapy, and Moist heat  PLAN FOR NEXT SESSION: Review FOTO; assess response to HEP; progress therex as indicated; use of modalities, manual therapy; and TPDN as indicated. Check on JAS  Jinger Middlesworth MS, PT 10/05/23 3:29  PM

## 2023-10-04 ENCOUNTER — Inpatient Hospital Stay: Payer: Medicare HMO

## 2023-10-05 ENCOUNTER — Encounter: Payer: Self-pay | Admitting: Internal Medicine

## 2023-10-05 ENCOUNTER — Ambulatory Visit: Payer: Medicare HMO | Admitting: Internal Medicine

## 2023-10-05 ENCOUNTER — Telehealth: Payer: Self-pay

## 2023-10-05 ENCOUNTER — Ambulatory Visit: Payer: Medicare HMO

## 2023-10-05 VITALS — BP 138/80 | HR 74 | Temp 97.7°F | Ht <= 58 in | Wt 155.4 lb

## 2023-10-05 DIAGNOSIS — K922 Gastrointestinal hemorrhage, unspecified: Secondary | ICD-10-CM

## 2023-10-05 DIAGNOSIS — E66811 Obesity, class 1: Secondary | ICD-10-CM | POA: Diagnosis not present

## 2023-10-05 DIAGNOSIS — D62 Acute posthemorrhagic anemia: Secondary | ICD-10-CM

## 2023-10-05 DIAGNOSIS — E6609 Other obesity due to excess calories: Secondary | ICD-10-CM | POA: Diagnosis not present

## 2023-10-05 DIAGNOSIS — M25561 Pain in right knee: Secondary | ICD-10-CM | POA: Diagnosis not present

## 2023-10-05 DIAGNOSIS — I480 Paroxysmal atrial fibrillation: Secondary | ICD-10-CM | POA: Diagnosis not present

## 2023-10-05 DIAGNOSIS — I1310 Hypertensive heart and chronic kidney disease without heart failure, with stage 1 through stage 4 chronic kidney disease, or unspecified chronic kidney disease: Secondary | ICD-10-CM | POA: Diagnosis not present

## 2023-10-05 DIAGNOSIS — Z6832 Body mass index (BMI) 32.0-32.9, adult: Secondary | ICD-10-CM | POA: Diagnosis not present

## 2023-10-05 DIAGNOSIS — K2931 Chronic superficial gastritis with bleeding: Secondary | ICD-10-CM

## 2023-10-05 DIAGNOSIS — M6281 Muscle weakness (generalized): Secondary | ICD-10-CM

## 2023-10-05 DIAGNOSIS — D6869 Other thrombophilia: Secondary | ICD-10-CM

## 2023-10-05 DIAGNOSIS — R262 Difficulty in walking, not elsewhere classified: Secondary | ICD-10-CM

## 2023-10-05 DIAGNOSIS — G8929 Other chronic pain: Secondary | ICD-10-CM | POA: Diagnosis not present

## 2023-10-05 NOTE — Patient Instructions (Signed)
 Peptic Ulcer  A peptic ulcer is a painful sore in the lining of your stomach or the first part of your small intestine. What are the causes? Common causes of this condition include: An infection. Using certain pain medicines too often or too much. Rare tumors in the stomach, small intestine, or pancreas. What increases the risk? You are more likely to get this condition if you: Smoke. Have a family history of ulcer disease. Drink alcohol. Have been hospitalized in an intensive care unit (ICU). What are the signs or symptoms? Symptoms include: Burning pain in the area between the chest and the belly button. The pain may: Not go away (be persistent). Be worse when your stomach is empty. Be worse at night. Heartburn. Feeling sick to your stomach (nauseous) and throwing up (vomiting). Bloating. If the ulcer results in bleeding, it can cause you to: Have poop (stool) that is black and looks like tar. Throw up bright red blood. Throw up material that looks like coffee grounds. How is this treated? Treatment for this condition may include: Stopping things that can cause the ulcer, such as: Smoking. Using pain medicines. Drinking alcohol or caffeine. Medicines to reduce stomach acid. Antibiotic medicines if the ulcer is caused by an infection. A procedure that is done using a small, flexible tube that has a camera at the end (upper endoscopy). This may be done if you have a bleeding ulcer. Surgery. This may be needed if: You have a lot of bleeding. The ulcer caused a hole somewhere in the digestive system. Follow these instructions at home: Do not drink alcohol if your doctor tells you not to drink. Limit how much caffeine you take in. Do not smoke or use any products that contain nicotine or tobacco. If you need help quitting, ask your doctor. Take over-the-counter and prescription medicines only as told by your doctor. Do not stop or change your medicines unless you talk with  your doctor about it first. Do not take aspirin, ibuprofen, or other NSAIDs unless your doctor told you to do so. Keep all follow-up visits. Contact a doctor if: You do not get better in 7 days after you start treatment. You keep having an upset stomach (indigestion) or heartburn. Get help right away if: You have sudden, sharp pain in your belly (abdomen). You have belly pain that does not go away. You have bloody poop (stool) or black, tarry poop. You throw up blood. It may look like coffee grounds. You feel light-headed or feel like you may pass out (faint). You get weak. You get sweaty or feel sticky and cold to the touch (clammy). These symptoms may be an emergency. Get help right away. Call 911. Do not wait to see if the symptoms will go away. Do not drive yourself to the hospital. Summary Symptoms of a peptic ulcer include burning pain in the area between the chest and the belly button. Do not smoke or use any products that contain nicotine or tobacco. If you need help quitting, ask your doctor. Take medicines only as told by your doctor. Limit how much alcohol and caffeine you have. Keep all follow-up visits. This information is not intended to replace advice given to you by your health care provider. Make sure you discuss any questions you have with your health care provider. Document Revised: 04/25/2021 Document Reviewed: 04/25/2021 Elsevier Patient Education  2024 [employer].

## 2023-10-05 NOTE — Progress Notes (Signed)
 I,[name], CMA,acting as a neurosurgeon for Hannah LOISE Slocumb, MD.,have documented all relevant documentation on the behalf of Hannah LOISE Slocumb, MD,as directed by  Hannah LOISE Slocumb, MD while in the presence of Hannah LOISE Slocumb, MD.  Subjective:  Patient ID: Hannah [name] , female    DOB: Feb 16, 1946 , 78 y.o.   MRN: [id number]  Chief Complaint  Patient presents with   Hospitalization Follow-up    HPI  Patient presents today for hospital follow up. She presented to [facility] on 12/26 for further evaluation of dizziness and chills. She was brought to hospital by EMS.  She was found to have HB 6.2, down from 9.6 several days prior.  Workup included hemoccult which was positive. She was admitted for GI bleed. Hospitalization course: she was transfused 2 units PRBCs.  Hemoglobin remained stable after 2 units.  EGD on 12/29 with benign-appearing esophageal stenosis, gastritis (biopsied), medium sized hiatal hernia and nonbleeding duodenal ulcer with clean ulcer base (Forrest class III).  Colonoscopy on 12/30 with few scattered diverticula in the entire colon.    On the day of discharge, H&H remained stable.  Eliquis  resumed after discussion with GI, Dr. Kristie.  Patient tolerated regular diet.  She is discharged on p.o. Protonix  40 mg twice daily for 1 month followed by 40 mg daily then after.  She was discharged in stable condition on 09/28/23. Today, she reports feeling a little better, she does feel like she has a bit more energy.    She also questions why the hospital changed her atorvastatin  proscription to morning instead of night. She reports taking at night.        Past Medical History:  Diagnosis Date   Allergy  25 years ago.   Anemia    Arthritis    Asthma    Atrial fibrillation (HCC)    Cataract Had cataracts surgery 2015 & 2016   Chronic kidney disease    Diabetes mellitus without complication (HCC)    Gout 12/17/2014   patient reported   Heart murmur 4 years ago    Hyperlipidemia    Hypertension    Hypothyroidism    Systemic lupus erythematosus (HCC)    Vitiligo      Family History  Problem Relation Age of Onset   Heart disease Father    Diabetes Father    Hypertension Mother    Obesity Mother    Varicose Veins Mother    Hypertension Sister    Hypertension Sister    Leukemia Sister    Cancer Sister    Obesity Sister      Current Outpatient Medications:    Accu-Chek Softclix Lancets lancets, Use to check blood sugars daily E11.69, Disp: 100 each, Rfl: 2   albuterol  (PROVENTIL  HFA;VENTOLIN  HFA) 108 (90 Base) MCG/ACT inhaler, Inhale 2 puffs into the lungs every 6 (six) hours as needed for wheezing or shortness of breath., Disp: 1 Inhaler, Rfl: 2   Alcohol  Swabs (ALCOHOL  PADS) 70 % PADS, Use as directed to check blood sugars 1 time per day dx: e11.22, Disp: 150 each, Rfl: 2   allopurinol  (ZYLOPRIM ) 100 MG tablet, Take 1 tablet (100 mg total) by mouth 3 (three) times a week for 90 doses. (Patient taking differently: Take 100 mg by mouth every Monday, Wednesday, and Friday.), Disp: 90 tablet, Rfl: 2   amLODipine  (NORVASC ) 5 MG tablet, Take 1 tablet (5 mg total) by mouth at bedtime., Disp: 90 tablet, Rfl: 2   apixaban  (ELIQUIS ) 5 MG TABS  tablet, Take 1 tablet (5 mg total) by mouth 2 (two) times daily., Disp: 180 tablet, Rfl: 1   Ascorbic Acid (VITAMIN C PO), Take 500 mg by mouth daily with lunch., Disp: , Rfl:    atorvastatin  (LIPITOR) 80 MG tablet, TAKE 1 TABLET BY MOUTH EVERY DAY, Disp: 90 tablet, Rfl: 2   B Complex-C (B-COMPLEX WITH VITAMIN C) tablet, Take 1 tablet by mouth daily with lunch., Disp: , Rfl:    budesonide -formoterol  (SYMBICORT ) 160-4.5 MCG/ACT inhaler, INHALE 2 PUFFS BY MOUTH TWICE DAILY IN THE MORNING AND IN THE EVENING (Patient taking differently: Inhale 2 puffs into the lungs 2 (two) times daily as needed (Asthma).), Disp: 10.2 g, Rfl: 6   Calcium  Carb-Cholecalciferol 600-800 MG-UNIT TABS, Take 1 tablet by mouth daily with lunch.,  Disp: , Rfl:    Cholecalciferol (VITAMIN D  PO), Take 2,000 Units by mouth daily with lunch., Disp: , Rfl:    denosumab  (PROLIA ) 60 MG/ML SOSY injection, Inject 60 mg into the skin every 6 (six) months. Courier to rheum: [address], [address], [place] [address]. Appt on 12/07/2022, Disp: 1 mL, Rfl: 0   glucose blood (ACCU-CHEK GUIDE) test strip, USE TO CHECK BLOOD SUGAR DAILY, Disp: 100 strip, Rfl: 2   irbesartan  (AVAPRO ) 300 MG tablet, TAKE 1 TABLET(300 MG) BY MOUTH DAILY (Patient taking differently: Take 300 mg by mouth daily.), Disp: 90 tablet, Rfl: 2   metoprolol  succinate (TOPROL -XL) 25 MG 24 hr tablet, TAKE 1 TABLET(25 MG) BY MOUTH DAILY (Patient taking differently: Take 25 mg by mouth daily.), Disp: 90 tablet, Rfl: 3   Multiple Vitamin (MULTIVITAMIN WITH MINERALS) TABS tablet, Take 1 tablet by mouth daily with lunch. Women's Multivitamin, Disp: , Rfl:    pantoprazole  (PROTONIX ) 40 MG tablet, Take 1 tablet (40 mg total) by mouth 2 (two) times daily for 30 days, THEN 1 tablet (40 mg total) daily., Disp: 120 tablet, Rfl: 0   Polyethyl Glycol-Propyl Glycol (SYSTANE) 0.4-0.3 % SOLN, Place 1-2 drops into both eyes 3 (three) times daily as needed (dry/irritated eyes.)., Disp: , Rfl:    SYNTHROID  88 MCG tablet, TAKE 1 TABLET BY MOUTH EVERY DAY MONDAY TO SATURDAY AND OFF ON SUNDAYS (Patient taking differently: Take 44-88 mcg by mouth See admin instructions. TAKE 88 mcg BY MOUTH EVERY DAY MONDAY TO SATURDAY AND 44 mg on Sunday in the morning), Disp: 90 tablet, Rfl: 0   dapagliflozin  propanediol (FARXIGA ) 10 MG TABS tablet, Take 1 tablet (10 mg total) by mouth daily before breakfast., Disp: 90 tablet, Rfl: 3   hydroxychloroquine  (PLAQUENIL ) 200 MG tablet, TAKE 1 TABLET BY MOUTH IN THE MORNING AND 1/2 TABLET IN THE EVENING MONDAY-FRIDAY ONLY, Disp: 90 tablet, Rfl: 0   Allergies  Allergen Reactions   Shellfish Allergy  Anaphylaxis and Hives     Review of Systems  Constitutional:  Positive for  fatigue.  Respiratory: Negative.    Cardiovascular: Negative.   Gastrointestinal: Negative.   Neurological: Negative.   Psychiatric/Behavioral: Negative.       Today's Vitals   10/05/23 1209  BP: 138/80  Pulse: 74  Temp: 97.7 F (36.5 C)  SpO2: 98%  Weight: 155 lb 6.4 oz (70.5 kg)  Height: 4' 10 (1.473 m)   Body mass index is 32.48 kg/m.  Wt Readings from Last 3 Encounters:  10/05/23 155 lb 6.4 oz (70.5 kg)  09/24/23 153 lb (69.4 kg)  09/13/23 154 lb 9.6 oz (70.1 kg)     Objective:  Physical Exam Vitals and nursing note  reviewed.  Constitutional:      Appearance: Normal appearance.  HENT:     Head: Normocephalic and atraumatic.  Eyes:     Extraocular Movements: Extraocular movements intact.  Cardiovascular:     Rate and Rhythm: Normal rate and regular rhythm.     Heart sounds: Normal heart sounds.  Pulmonary:     Effort: Pulmonary effort is normal.     Breath sounds: Normal breath sounds.  Musculoskeletal:     Cervical back: Normal range of motion.  Skin:    General: Skin is warm.  Neurological:     General: No focal deficit present.     Mental Status: She is alert.  Psychiatric:        Mood and Affect: Mood normal.        Behavior: Behavior normal.         Assessment And Plan:  ABLA (acute blood loss anemia) Assessment & Plan: TCM PERFORMED. A MEMBER OF THE CLINICAL TEAM SPOKE WITH THE PATIENT UPON DISCHARGE. DISCHARGE SUMMARY WAS REVIEWED IN FULL DETAIL DURING THE VISIT. MEDS RECONCILED AND COMPARED TO DISCHARGE MEDS. MEDICATION LIST WAS UPDATED AND REVIEWED WITH THE PATIENT. GREATER THAN 50% FACE TO FACE TIME WAS SPENT IN COUNSELING AND COORDINATION OF CARE. ALL QUESTIONS WERE ANSWERED TO THE SATISFACTION OF THE PATIENT.  She was transfused 2 units PRBCs.  EGD/colonoscopy results as per HPI. She was restarted on Eliquis  after discussion with GI.  She will continue with Protonix  40mg  twice daily for 1 month followed by 40 mg daily.  She agrees to repeat  CBC/CMP today.  She is advised to avoid NSAIDs.    Orders: -     CBC  Chronic superficial gastritis with bleeding Assessment & Plan: Chronic, see HPI for EGD results. She will continue with Protonix  twice daily x 1 month, then once daily.    Hypertensive heart and renal disease with renal failure, stage 1 through stage 4 or unspecified chronic kidney disease, without heart failure Assessment & Plan: Chronic, fair control. She is reminded to follow low sodium diet. No med changes today. She will continue with irbesartan  300mg  daily, metoprolol  XL 25mg  daily an amlodipine  5mg  daily.   Orders: -     [id number]+EGFR  Paroxysmal atrial fibrillation (HCC) Assessment & Plan: Chronic, now back on Eliquis . She is currently in sinus rhythm.    Acquired thrombophilia (HCC) Assessment & Plan: She is on Eliquis  due to PAF.   Class 1 obesity due to excess calories with serious comorbidity and body mass index (BMI) of 32.0 to 32.9 in adult Assessment & Plan: She is encouraged to strive for BMI less than 30 to decrease cardiac risk. Advised to aim for at least 150 minutes of exercise per week.     She is encouraged to strive for BMI less than 30 to decrease cardiac risk. Advised to aim for at least 150 minutes of exercise per week.    Return if symptoms worsen or fail to improve.  Patient was given opportunity to ask questions. Patient verbalized understanding of the plan and was able to repeat key elements of the plan. All questions were answered to their satisfaction.    I, Hannah LOISE Slocumb, MD, have reviewed all documentation for this visit. The documentation on 10/05/23 for the exam, diagnosis, procedures, and orders are all accurate and complete.   IF YOU HAVE BEEN REFERRED TO A SPECIALIST, IT MAY TAKE 1-2 WEEKS TO SCHEDULE/PROCESS THE REFERRAL. IF YOU HAVE NOT HEARD FROM US /SPECIALIST IN  TWO WEEKS, PLEASE GIVE US  A CALL AT [phone].   THE PATIENT IS ENCOURAGED TO PRACTICE  SOCIAL DISTANCING DUE TO THE COVID-19 PANDEMIC.

## 2023-10-05 NOTE — Transitions of Care (Post Inpatient/ED Visit) (Cosign Needed)
   10/05/2023  Name: Hannah [name] MRN: [id number] DOB: 1946-05-26  Today's TOC FU Call Status:   Patient's Name and Date of Birth confirmed.  Transition Care Management Follow-up Telephone Call Date of Discharge: 09/28/23 Discharge Facility: [facility]) Type of Discharge: Inpatient Admission Primary Inpatient Discharge Diagnosis:: GI Bleed How have you been since you were released from the hospital?: Better Any questions or concerns?: No  Items Reviewed: Did you receive and understand the discharge instructions provided?: No Medications obtained,verified, and reconciled?: Yes (Medications Reviewed) Any new allergies since your discharge?: No Dietary orders reviewed?: NA Do you have support at home?: Yes  Medications Reviewed Today: Medications Reviewed Today   Medications were not reviewed in this encounter     Home Care and Equipment/Supplies: Were Home Health Services Ordered?: NA Any new equipment or medical supplies ordered?: NA  Functional Questionnaire: Do you need assistance with bathing/showering or dressing?: No Do you need assistance with meal preparation?: No Do you need assistance with eating?: No Do you have difficulty maintaining continence: No Do you need assistance with getting out of bed/getting out of a chair/moving?: No Do you have difficulty managing or taking your medications?: No  Follow up appointments reviewed: PCP Follow-up appointment confirmed?: Yes Date of PCP follow-up appointment?: 10/05/23 Follow-up Provider: Catheryn Slocumb Specialist [facility] Follow-up appointment confirmed?: NA Do you need transportation to your follow-up appointment?: No Do you understand care options if your condition(s) worsen?: Yes-patient verbalized understanding    SIGNATURE Richerd Lemmings, CMA

## 2023-10-06 ENCOUNTER — Other Ambulatory Visit: Payer: Self-pay | Admitting: Pharmacist

## 2023-10-06 LAB — CBC
Hematocrit: 33.3 % — ABNORMAL LOW (ref 34.0–46.6)
Hemoglobin: 10.5 g/dL — ABNORMAL LOW (ref 11.1–15.9)
MCH: 29.9 pg (ref 26.6–33.0)
MCHC: 31.5 g/dL (ref 31.5–35.7)
MCV: 95 fL (ref 79–97)
Platelets: 357 10*3/uL (ref 150–450)
RBC: 3.51 x10E6/uL — ABNORMAL LOW (ref 3.77–5.28)
RDW: 14.4 % (ref 11.7–15.4)
WBC: 5.9 10*3/uL (ref 3.4–10.8)

## 2023-10-06 LAB — CMP14+EGFR
ALT: 9 [IU]/L (ref 0–32)
AST: 20 [IU]/L (ref 0–40)
Albumin: 4 g/dL (ref 3.8–4.8)
Alkaline Phosphatase: 78 [IU]/L (ref 44–121)
BUN/Creatinine Ratio: 11 — ABNORMAL LOW (ref 12–28)
BUN: 13 mg/dL (ref 8–27)
Bilirubin Total: 0.6 mg/dL (ref 0.0–1.2)
CO2: 24 mmol/L (ref 20–29)
Calcium: 9.9 mg/dL (ref 8.7–10.3)
Chloride: 104 mmol/L (ref 96–106)
Creatinine, Ser: 1.2 mg/dL — ABNORMAL HIGH (ref 0.57–1.00)
Globulin, Total: 2.2 g/dL (ref 1.5–4.5)
Glucose: 110 mg/dL — ABNORMAL HIGH (ref 70–99)
Potassium: 4.7 mmol/L (ref 3.5–5.2)
Sodium: 142 mmol/L (ref 134–144)
Total Protein: 6.2 g/dL (ref 6.0–8.5)
eGFR: 46 mL/min/{1.73_m2} — ABNORMAL LOW (ref >59)

## 2023-10-06 NOTE — Progress Notes (Signed)
 Contacted [employer]. They have than an application was submitted but are unable to find the application to approve it. I have refaxed this today and will follow up by next week.   Catie [facility], PharmD, BCACP, CPP Clinical Pharmacist [facility] [phone]

## 2023-10-08 ENCOUNTER — Ambulatory Visit: Payer: Medicare HMO

## 2023-10-08 ENCOUNTER — Telehealth: Payer: Self-pay

## 2023-10-08 ENCOUNTER — Other Ambulatory Visit: Payer: Self-pay | Admitting: Pharmacist

## 2023-10-08 NOTE — Progress Notes (Signed)
 Care Coordination Call  Contacted [employer] to follow up on status of Jardiance application for 2025, as [employer] lost previous application.

## 2023-10-08 NOTE — Telephone Encounter (Signed)
 Per automated system  PAP: Patient assistance application Synthroid  for has been approved by PAP Companies: [employer] from 09/29/2023 to 09/27/2024. Medication should be delivered to PAP Delivery: Home For further shipping updates, please contact [employer] at [phone] Pt ID is: per automated system

## 2023-10-11 ENCOUNTER — Inpatient Hospital Stay: Payer: Medicare HMO | Admitting: Hematology

## 2023-10-11 NOTE — Therapy (Signed)
 OUTPATIENT PHYSICAL THERAPY LOWER EXTREMITY TREATMENT   Patient Name: Hannah [name] MRN: [id number] DOB:05-06-1946, 78 y.o., female Today's Date: 10/12/2023  END OF SESSION:  PT End of Session - 10/12/23 1422     Visit Number 18    Number of Visits 25    Date for PT Re-Evaluation 10/22/23    Authorization Type [employer]    Authorization Time Period Approved 12 visits 09/06/23-10/23/23    Authorization - Visit Number 5    Authorization - Number of Visits 12    Progress Note Due on Visit 20    PT Start Time 1416    PT Stop Time 1500    PT Time Calculation (min) 44 min    Activity Tolerance Patient tolerated treatment well    Behavior During Therapy WFL for tasks assessed/performed             Past Medical History:  Diagnosis Date   Allergy  25 years ago.   Anemia    Arthritis    Asthma    Atrial fibrillation (HCC)    Cataract Had cataracts surgery 2015 & 2016   Chronic kidney disease    Diabetes mellitus without complication (HCC)    Gout 12/17/2014   patient reported   Heart murmur 4 years ago   Hyperlipidemia    Hypertension    Hypothyroidism    Systemic lupus erythematosus (HCC)    Vitiligo    Past Surgical History:  Procedure Laterality Date   BIOPSY  09/26/2023   Procedure: BIOPSY;  Surgeon: Aneita Gwendlyn DASEN, MD;  Location: [facility];  Service: Gastroenterology;;   CATARACT EXTRACTION Bilateral 2015   COLONOSCOPY WITH PROPOFOL  N/A 09/27/2023   Procedure: COLONOSCOPY WITH PROPOFOL ;  Surgeon: Kristie Lamprey, MD;  Location: [facility];  Service: Gastroenterology;  Laterality: N/A;   DILATION AND CURETTAGE OF UTERUS     DOPPLER ECHOCARDIOGRAPHY  05/2018   Internist to review with pt; potential heart murmur 06/20/18   ESOPHAGOGASTRODUODENOSCOPY (EGD) WITH PROPOFOL  N/A 09/26/2023   Procedure: ESOPHAGOGASTRODUODENOSCOPY (EGD) WITH PROPOFOL ;  Surgeon: Aneita Gwendlyn DASEN, MD;  Location: [facility];  Service: Gastroenterology;  Laterality: N/A;    EYE SURGERY  Cataract Surgery, 2015 & 2016.   keratosis removal  2021   KNEE CLOSED REDUCTION Left 07/06/2022   Procedure: CLOSED MANIPULATION KNEE;  Surgeon: Melodi Lerner, MD;  Location: [facility];  Service: Orthopedics;  Laterality: Left;   KNEE CLOSED REDUCTION Right 09/13/2023   Procedure: CLOSED MANIPULATION KNEE;  Surgeon: Melodi Lerner, MD;  Location: [facility];  Service: Orthopedics;  Laterality: Right;   REPLACEMENT TOTAL KNEE Left    SKIN SURGERY  11/30/2018   left side of face   TOOTH EXTRACTION     TOTAL KNEE ARTHROPLASTY Left 02/02/2022   Procedure: TOTAL KNEE ARTHROPLASTY;  Surgeon: Melodi Lerner, MD;  Location: [facility];  Service: Orthopedics;  Laterality: Left;   TOTAL KNEE ARTHROPLASTY Right 07/12/2023   Procedure: RIGHT TOTAL KNEE ARTHROPLASTY;  Surgeon: Melodi Lerner, MD;  Location: [facility];  Service: Orthopedics;  Laterality: Right;   Patient Active Problem List   Diagnosis Date Noted   Occult blood detected in feces by immunoassay 09/26/2023   ABLA (acute blood loss anemia) 09/23/2023   GI bleed 09/23/2023   Pure hypercholesterolemia 07/18/2023   Personal history of COVID-19 07/18/2023   Primary osteoarthritis of right knee 07/12/2023   Acquired thrombophilia (HCC) 01/12/2023   Arthrofibrosis of knee joint, right 07/06/2022   OA (osteoarthritis) of knee 02/02/2022   Primary  osteoarthritis of left knee 02/02/2022   Chronic bilateral low back pain without sciatica 06/25/2021   Paresthesia and pain of extremity 06/25/2021   Atrial fibrillation (HCC) 04/16/2021   Demand ischemia (HCC) 03/26/2021   Unspecified atrial fibrillation (HCC) 03/26/2021   COVID-19 virus infection 03/26/2021   PVC (premature ventricular contraction) 05/06/2020   Type 2 diabetes mellitus with stage 3 chronic kidney disease, without long-term current use of insulin  (HCC) 03/20/2020   Palpitations 03/20/2020   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   Primary hypothyroidism 03/20/2020    Vitiligo 03/20/2020   Class 2 severe obesity due to excess calories with serious comorbidity and body mass index (BMI) of 35.0 to 35.9 in adult (HCC) 10/31/2019   Iron  deficiency anemia 08/12/2017   Osteoporosis 10/02/2016   Systemic lupus erythematosus (HCC) 10/02/2016   Rheumatoid arthritis involving multiple sites with positive rheumatoid factor (HCC) 10/02/2016   High risk medication use 10/02/2016   History of chronic kidney disease 10/02/2016   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/2016   Primary osteoarthritis of both knees 10/02/2016   Vitamin D  deficiency 10/02/2016   History of diabetes mellitus 10/02/2016   History of hypertension 10/02/2016   History of asthma 10/02/2016   Absolute anemia    MGUS (monoclonal gammopathy of unknown significance)    Normocytic anemia 03/21/2015   Essential hypertension 03/20/2015   Abnormal CT of the chest 07/30/2012   Asthma 06/19/2012    PCP: Jarold Medici, MD  REFERRING PROVIDER: Melodi Lerner, MD   REFERRING DIAG: M17.11 (ICD-10-CM) - Unilateral primary osteoarthritis, right knee   THERAPY DIAG:  Chronic pain of right knee  Muscle weakness (generalized)  Difficulty in walking, not elsewhere classified  Rationale for Evaluation and Treatment: Rehabilitation  ONSET DATE: 07/12/23  SUBJECTIVE:   SUBJECTIVE STATEMENT: 10/12/23: Pt reports her R knee stiffens quickly, esp after sitting for about 5 mins.  09/14/23: Pt reports she underwent a knee manipulation yesterday. Pt states Dr. Melodi us  going to order a JAS knee brace. Pt reports she completes her ROM exs at home 4x daily.  EVAL: Pt reports she has been completing her HEP 1x daily and using cold packs on a frequent basis for pain and swelling management.  PAIN:  Are you having pain? Yes: NPRS scale: 5/10; pain range 5-10/10 Pain location: R knee Pain description: ache ,throb ,sharp Aggravating factors: movement, lifting, wt  bearing Relieving factors: Pain medication, muscle relaxer, cold packs  PERTINENT HISTORY: High BMI, L LKA, Afib  PRECAUTIONS: Knee  RED FLAGS: None   WEIGHT BEARING RESTRICTIONS: No  FALLS:  Has patient fallen in last 6 months? No  LIVING ENVIRONMENT: Lives with: lives alone Lives in: House/apartment Stairs: No Has following equipment at home: Single point cane, Environmental Consultant - 2 wheeled, Wheelchair (manual), and bed side commode  OCCUPATION: Retired  PLOF: Independent with household mobility with device  PATIENT GOALS: good use of my R knee  NEXT MD VISIT: 07/27/23  OBJECTIVE:  Note: Objective measures were completed at Evaluation unless otherwise noted.  DIAGNOSTIC FINDINGS: None for the R knee in Epic  PATIENT SURVEYS:  FOTO: Perceived function   22%, predicted   43%   COGNITION: Overall cognitive status: Within functional limits for tasks assessed     SENSATION: WFL  EDEMA:  Significant R knee edema present   MUSCLE LENGTH: Hamstrings: Right NT deg; Left NT deg Debby test: Right NT deg; Left NT deg  POSTURE: rounded shoulders, forward head, and anterior pelvic  tilt  PALPATION: TTP to the R peri-knee  LOWER EXTREMITY ROM:  Active ROM Right eval Left eval Rt 07/20/23 Rt 07/22/23 RT 07/27/23 Rt 07/29/23 08/03/23 Rt 08/06/23  Hip flexion          Hip extension          Hip abduction          Hip adduction          Hip internal rotation          Hip external rotation          Knee flexion 50  70 AA 80 AA75 AA85 AA 72 AA85  Knee extension 30  30 30 23  25d  30  Ankle dorsiflexion          Ankle plantarflexion          Ankle inversion          Ankle eversion           (Blank rows = not tested) 08/11/23:30-85d. 08/19/23=AAROM 30-90d. 08/31/23=AA90d   09/02/23=AA 30-85d. 09/08/23=AA30-90. 09/14/23=AA34-80: 10/01/23: 28-90 AA (end of session seated). 10/05/23: 28-90 AA (end of session supine). 10/12/23: 33d AA (end of session supine)   LOWER EXTREMITY  MMT:  MMT Right eval Left eval RT 07/27/23 Rt 09/02/23  Hip flexion 2  3 with limited ROM 4+  Hip extension 2   4+  Hip abduction 2   4+  Hip adduction      Hip internal rotation      Hip external rotation 2   4+  Knee flexion 2   4+ with limited ROM  Knee extension 2  3 with limited ROM 4+ with limited ROM  Ankle dorsiflexion      Ankle plantarflexion      Ankle inversion      Ankle eversion       (Blank rows = not tested)  LOWER EXTREMITY SPECIAL TESTS:  NT  FUNCTIONAL TESTS:  5 times sit to stand: TBA 2 minute walk test: TBA  5xSTS=24.7 c arm rest  2MWT=78' c RW GAIT: Distance walked: 49ft Assistive device utilized: Environmental Consultant - 2 wheeled Level of assistance: SBA Comments: Antalgic over the R LE  TRANSFER: Mod A for mat table transfer c A for the R LE  TODAY'S TREATMENT:  [facility] Adult PT Treatment:                                                DATE: 10/12/23 Therapeutic Exercise: LAQ 5# 10 x 2  STS  2x10 c hand assist Standing hip abd 2x10 5# Standing knee lifts 2x10 5# SLR 10 x 2 Seated h/s curls red band 10 x 2 Manual Therapy: Patella mobs all planes  A/P and P/A fem/tib grade 3 mobs  PROM for flexion and extension  [facility] Adult PT Treatment:                                                DATE: 10/05/23 Therapeutic Exercise: LAQ 5# 10 x 2  SLR 10 x 2 STS x10 c hand assist Seated h/s curls red band 10 x 2 Hamstring stretch seated with strap- foot on stool  Seated scoot stretch for knee flexion  Manual  Therapy: Patella mobs all planes  A/P and P/A fem/tib grade 3 mobs  PROM for flexion and extension Therapeutic Activity: Gait training- Use of 1 ll bar to simulate walking with a SPC. Walking c [facility] CGA for safety 176ft. Increased antalgic gait pattern over 100'.  [facility] Adult PT Treatment:                                                DATE: 10/01/23 Therapeutic Exercise: Nustep L5 UE/LE x 6 minutes  LAQ 5# 10 x 2  Seated h/s curls red band x 20 Hamstring  stretch seated with strap- foot on stool  Seated scoot stretch for knee flexion  Manual Therapy: Seated strap mobs to increased knee ext followed by PROM Seated A/P tibia mobs to increased flexion, followed by PROM  [facility] Adult PT Treatment:                                                DATE: 09/14/23 Therapeutic Exercise: Nustep L5 x 6 minutes UE/LE Seated knee flexion x5 20 pt over pressure  Supine knee flexion c strap and PT over pressure Supine knee ext c PT over pressure Manual Therapy: Patella mobs all planes  A/P and P/A fem/tib grade 3 mobs  Contract/relax knee flexion and ext Self Care: Pt is to focus on R knee ROM exs from her HEP Measurements for JAS knee brace  PATIENT EDUCATION:  Education details: Eval findings, POC, HEP, self care  Person educated: Patient Education method: Explanation, Demonstration, Tactile cues, Verbal cues, and Handouts Education comprehension: verbalized understanding, returned demonstration, verbal cues required, and tactile cues required  HOME EXERCISE PROGRAM: Access Code: [email or web address] URL: [email or web address] Date: 08/19/2023 Prepared by: Dasie Daft  Exercises - Long Sitting Quad Set (Mirrored)  - 3 x daily - 7 x weekly - 1 sets - 10 reps - 5 hold - Supine Heel Slide with Strap  - 3 x daily - 7 x weekly - 1 sets - 10 reps - 10 hold - Supine Knee Extension Strengthening (Mirrored)  - 3 x daily - 7 x weekly - 1 sets - 10 reps - 3 hold - Supine Hip Abduction AROM (Mirrored)  - 3 x daily - 7 x weekly - 1 sets - 10 reps - 3 hold - Seated Knee Flexion Slide  - 3 x daily - 7 x weekly - 1 sets - 10 reps - 10 hold - Active Straight Leg Raise with Quad Set  - 3 x daily - 7 x weekly - 1 sets - 10 reps - Seated Knee Extension Stretch with Chair  - 3 x daily - 7 x weekly - 1 sets  ASSESSMENT:  CLINICAL IMPRESSION: 10/12/23: PT was completed for R knee ROM and strengthening with greater emphasis on strengthening today. Pt is still  waiting to hear from the JAS rep. This PT called and spoke with the JAS rep during the session. He reports the pt's paperwork has been sent in for insurance approval. He warned that currently the insurance approval process is slow. Pt tolerated PT today without adverse effects. No gains have been made with the pt's R knee ROM. Will assess functional tests the next PT session.  Eval: Patient  is a 78 y.o. female who was seen today for physical therapy evaluation and treatment for M17.11 (ICD-10-CM) - Unilateral primary osteoarthritis, right knee . RIGHT TOTAL KNEE ARTHROPLASTY on 07/12/2023. Pt presents with markedly decreased R knee AROM and strength, and decreased functional mobility. Pt will benefit from skilled PT 2w8 to address impairments to optimize R LE/knee function with less pain.   OBJECTIVE IMPAIRMENTS: decreased activity tolerance, decreased mobility, difficulty walking, decreased ROM, decreased strength, increased edema, obesity, and pain.   ACTIVITY LIMITATIONS: carrying, lifting, bending, sitting, standing, squatting, sleeping, stairs, transfers, bed mobility, bathing, toileting, dressing, locomotion level, and caring for others  PARTICIPATION LIMITATIONS: meal prep, cleaning, laundry, and driving  PERSONAL FACTORS: Age, Fitness, Past/current experiences, Time since onset of injury/illness/exacerbation, and 1 comorbidity: high BMI  are also affecting patient's functional outcome.   REHAB POTENTIAL: Good  CLINICAL DECISION MAKING: Stable/uncomplicated  EVALUATION COMPLEXITY: Low   GOALS:  SHORT TERM GOALS: Target date: 08/06/23 Pt will be Ind in an initial HEP  Baseline: started Goal status: MET  2.  Pt will be able to complete a SLR and SAQ with or without quad lag Baseline: not able to complete 07/29/23: able to complete both with limited knee ext ROM present Goal status: MET  3.  Increase R knee AROM to 15-80d for improved R knee function Baseline: 30-50 10/01/23: AAROM  28-90 Goal status: PARTIALLY MET FOR FLEXION  LONG TERM GOALS: Target date: 10/22/23  Pt will be Ind in a final HEP to maintain achieved LOF Baseline: started Goal status: ONGOING  2.  Increase R hip and knee strength to 4/5 for improved function use of the R LE Baseline: see flow sheets 09/02/23: see flow sheets Goal status: MET  3.  Increased R knee AROM to 5-115d for appropriate fuctional mobility Baseline: 30-50d 09/02/23: 30-85d Goal status: ONGOING  4.  Improve 5xSTS by MCID of 5 and by MCID of 37ft as indication of improved functional mobility  Baseline: TBA when pt has progressed to gait c a [facility] 08/31/23: 5xSTS=24.7 c arm rest      2MWT=78' c RW Goal status: ONGOING  5.  Pt's FOTO score will improved to the predicted value of 43% as indication of improved function  Baseline: 22% Goal status: ONGOING  6.  Pt will be able to walk 458ft c or s a SPC and ascend/descend 12 steps c or s a SPC and c use of a HR for community mobility Baseline: 38ft c RW Goal status: ONGOING   PLAN:  PT FREQUENCY: 2x/week  PT DURATION: 6 weeks  PLANNED INTERVENTIONS: 97164- PT Re-evaluation, 97110-Therapeutic exercises, 97530- Therapeutic activity, 97535- Self Care, [id number]- Manual therapy, [id number]- Gait training, 97014- Electrical stimulation (unattended), [id number]- Electrical stimulation (manual), 97016- Vasopneumatic device, Stair training, Dry Needling, Joint mobilization, Cryotherapy, and Moist heat  PLAN FOR NEXT SESSION: Review FOTO; assess response to HEP; progress therex as indicated; use of modalities, manual therapy; and TPDN as indicated. Check on JAS  Susannah Carbin MS, PT 10/12/23 4:16 PM

## 2023-10-12 ENCOUNTER — Other Ambulatory Visit: Payer: Self-pay | Admitting: Rheumatology

## 2023-10-12 ENCOUNTER — Ambulatory Visit: Payer: Medicare HMO

## 2023-10-12 ENCOUNTER — Other Ambulatory Visit: Payer: Self-pay | Admitting: Pharmacist

## 2023-10-12 ENCOUNTER — Telehealth: Payer: Self-pay

## 2023-10-12 DIAGNOSIS — M25561 Pain in right knee: Secondary | ICD-10-CM | POA: Diagnosis not present

## 2023-10-12 DIAGNOSIS — M6281 Muscle weakness (generalized): Secondary | ICD-10-CM | POA: Diagnosis not present

## 2023-10-12 DIAGNOSIS — R262 Difficulty in walking, not elsewhere classified: Secondary | ICD-10-CM | POA: Diagnosis not present

## 2023-10-12 DIAGNOSIS — G8929 Other chronic pain: Secondary | ICD-10-CM | POA: Diagnosis not present

## 2023-10-12 DIAGNOSIS — M0579 Rheumatoid arthritis with rheumatoid factor of multiple sites without organ or systems involvement: Secondary | ICD-10-CM

## 2023-10-12 MED ORDER — DAPAGLIFLOZIN PROPANEDIOL 10 MG PO TABS: 10.0000 mg | ORAL_TABLET | Freq: Every day | ORAL | 3 refills | Status: DC

## 2023-10-12 NOTE — Telephone Encounter (Addendum)
 PAP: PAP application for Farxiga , [employer] ([place]) has been mailed to pt's home address on file. Will fax provider portion of application to provider's office when pt's portion is received.  I have faxed the provider portion to Dr. Catheryn Slocumb for review

## 2023-10-12 NOTE — Progress Notes (Signed)
 Care Coordination Call  Patient denied for Jardiance assistance due to income. Contacted patient, left voicemail asking her to give me a call at her convenience so we can discuss switching to [place].   Synthroid was approved.

## 2023-10-12 NOTE — Progress Notes (Signed)
 Care Coordination Call  Spoke with patient. She is ok with switch to Farxiga . Will collaborate with team to mail application to patient for [place] and [place] assistance.   [facility], PharmD, BCACP, CPP Clinical Pharmacist [facility] [phone]

## 2023-10-13 NOTE — Therapy (Signed)
OUTPATIENT PHYSICAL THERAPY LOWER EXTREMITY TREATMENT   Patient Name: KARESA SITZER MRN: [id number] DOB:[birth year], 78 y.o., female Today's Date: 10/15/2023  END OF SESSION:  PT End of Session - 10/15/23 1440     Visit Number 19    Number of Visits 25    Date for PT Re-Evaluation 10/22/23    Authorization Type [employer]    Authorization Time Period Approved 12 visits 09/06/23-10/23/23    Authorization - Visit Number 6    Authorization - Number of Visits 12    Progress Note Due on Visit 20    PT Start Time 1315    PT Stop Time 1400    PT Time Calculation (min) 45 min    Activity Tolerance Patient tolerated treatment well    Behavior During Therapy WFL for tasks assessed/performed              Past Medical History:  Diagnosis Date   Allergy 25 years ago.   Anemia    Arthritis    Asthma    Atrial fibrillation (HCC)    Cataract Had cataracts surgery 2015 & 2016   Chronic kidney disease    Diabetes mellitus without complication (HCC)    Gout 12/17/2014   patient reported   Heart murmur 4 years ago   Hyperlipidemia    Hypertension    Hypothyroidism    Systemic lupus erythematosus (HCC)    Vitiligo    Past Surgical History:  Procedure Laterality Date   BIOPSY  09/26/2023   Procedure: BIOPSY;  Surgeon: Meryl Dare, MD;  Location: [facility];  Service: Gastroenterology;;   CATARACT EXTRACTION Bilateral 2015   COLONOSCOPY WITH PROPOFOL N/A 09/27/2023   Procedure: COLONOSCOPY WITH PROPOFOL;  Surgeon: [name], MD;  Location: [facility];  Service: Gastroenterology;  Laterality: N/A;   DILATION AND CURETTAGE OF UTERUS     DOPPLER ECHOCARDIOGRAPHY  05/2018   Internist to review with pt; potential heart murmur 06/20/18   ESOPHAGOGASTRODUODENOSCOPY (EGD) WITH PROPOFOL N/A 09/26/2023   Procedure: ESOPHAGOGASTRODUODENOSCOPY (EGD) WITH PROPOFOL;  Surgeon: Meryl Dare, MD;  Location: [facility];  Service: Gastroenterology;  Laterality: N/A;    EYE SURGERY  Cataract Surgery, 2015 & 2016.   keratosis removal  2021   KNEE CLOSED REDUCTION Left 07/06/2022   Procedure: CLOSED MANIPULATION KNEE;  Surgeon: Ollen Gross, MD;  Location: [facility];  Service: Orthopedics;  Laterality: Left;   KNEE CLOSED REDUCTION Right 09/13/2023   Procedure: CLOSED MANIPULATION KNEE;  Surgeon: Ollen Gross, MD;  Location: [facility];  Service: Orthopedics;  Laterality: Right;   REPLACEMENT TOTAL KNEE Left    SKIN SURGERY  11/30/2018   left side of face   TOOTH EXTRACTION     TOTAL KNEE ARTHROPLASTY Left 02/02/2022   Procedure: TOTAL KNEE ARTHROPLASTY;  Surgeon: Ollen Gross, MD;  Location: [facility];  Service: Orthopedics;  Laterality: Left;   TOTAL KNEE ARTHROPLASTY Right 07/12/2023   Procedure: RIGHT TOTAL KNEE ARTHROPLASTY;  Surgeon: Ollen Gross, MD;  Location: [facility];  Service: Orthopedics;  Laterality: Right;   Patient Active Problem List   Diagnosis Date Noted   Occult blood detected in feces by immunoassay 09/26/2023   ABLA (acute blood loss anemia) 09/23/2023   GI bleed 09/23/2023   Pure hypercholesterolemia 07/18/2023   Personal history of COVID-19 07/18/2023   Primary osteoarthritis of right knee 07/12/2023   Acquired thrombophilia (HCC) 01/12/2023   Arthrofibrosis of knee joint, right 07/06/2022   OA (osteoarthritis) of knee 02/02/2022  Primary osteoarthritis of left knee 02/02/2022   Chronic bilateral low back pain without sciatica 06/25/2021   Paresthesia and pain of extremity 06/25/2021   Atrial fibrillation (HCC) 04/16/2021   Demand ischemia (HCC) 03/26/2021   Unspecified atrial fibrillation (HCC) 03/26/2021   COVID-19 virus infection 03/26/2021   PVC (premature ventricular contraction) 05/06/2020   Type 2 diabetes mellitus with stage 3 chronic kidney disease, without long-term current use of insulin (HCC) 03/20/2020   Palpitations 03/20/2020   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   Primary hypothyroidism 03/20/2020    Vitiligo 03/20/2020   Class 2 severe obesity due to excess calories with serious comorbidity and body mass index (BMI) of 35.0 to 35.9 in adult (HCC) 10/31/2019   Iron deficiency anemia 08/12/2017   Osteoporosis 10/02/2016   Systemic lupus erythematosus (HCC) 10/02/2016   Rheumatoid arthritis involving multiple sites with positive rheumatoid factor (HCC) 10/02/2016   High risk medication use 10/02/2016   History of chronic kidney disease 10/02/2016   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/2016   Primary osteoarthritis of both knees 10/02/2016   Vitamin D deficiency 10/02/2016   History of diabetes mellitus 10/02/2016   History of hypertension 10/02/2016   History of asthma 10/02/2016   Absolute anemia    MGUS (monoclonal gammopathy of unknown significance)    Normocytic anemia 03/21/2015   Essential hypertension 03/20/2015   Abnormal CT of the chest 07/30/2012   Asthma 06/19/2012    PCP: Dorothyann Peng, MD  REFERRING PROVIDER: Ollen Gross, MD   REFERRING DIAG: M17.11 (ICD-10-CM) - Unilateral primary osteoarthritis, right knee   THERAPY DIAG:  Chronic pain of right knee  Muscle weakness (generalized)  Difficulty in walking, not elsewhere classified  Rationale for Evaluation and Treatment: Rehabilitation  ONSET DATE: 07/12/23  SUBJECTIVE:   SUBJECTIVE STATEMENT: 10/12/23: Pt reports her R knee remains the same.  09/14/23: Pt reports she underwent a knee manipulation yesterday. Pt states Dr. Lequita Halt [place] going to order a JAS knee brace. Pt reports she completes her ROM exs at home 4x daily.  EVAL: Pt reports she has been completing her HEP 1x daily and using cold packs on a frequent basis for pain and swelling management.  PAIN:  Are you having pain? Yes: NPRS scale: 5/10; pain range 5-10/10 Pain location: R knee Pain description: ache ,throb ,sharp Aggravating factors: movement, lifting, wt bearing Relieving factors: Pain medication,  muscle relaxer, cold packs  PERTINENT HISTORY: High BMI, L LKA, Afib  PRECAUTIONS: Knee  RED FLAGS: None   WEIGHT BEARING RESTRICTIONS: No  FALLS:  Has patient fallen in last 6 months? No  LIVING ENVIRONMENT: Lives with: lives alone Lives in: House/apartment Stairs: No Has following equipment at home: Single point cane, Environmental consultant - 2 wheeled, Wheelchair (manual), and bed side commode  OCCUPATION: Retired  PLOF: Independent with household mobility with device  PATIENT GOALS: good use of my R knee  NEXT MD VISIT: 07/27/23  OBJECTIVE:  Note: Objective measures were completed at Evaluation unless otherwise noted.  DIAGNOSTIC FINDINGS: None for the R knee in Epic  PATIENT SURVEYS:  FOTO: Perceived function   22%, predicted   43%   COGNITION: Overall cognitive status: Within functional limits for tasks assessed     SENSATION: WFL  EDEMA:  Significant R knee edema present   MUSCLE LENGTH: Hamstrings: Right NT deg; Left NT deg Thomas test: Right NT deg; Left NT deg  POSTURE: rounded shoulders, forward head, and anterior pelvic tilt  PALPATION: TTP to  the R peri-knee  LOWER EXTREMITY ROM:  Active ROM Right eval Left eval Rt 07/20/23 Rt 07/22/23 RT 07/27/23 Rt 07/29/23 08/03/23 Rt 08/06/23  Hip flexion          Hip extension          Hip abduction          Hip adduction          Hip internal rotation          Hip external rotation          Knee flexion 50  70 AA 80 AA75 AA85 AA 72 AA85  Knee extension 30  30 30 23  25d  30  Ankle dorsiflexion          Ankle plantarflexion          Ankle inversion          Ankle eversion           (Blank rows = not tested) 08/11/23:30-85d. 08/19/23=AAROM 30-90d. 08/31/23=AA90d   09/02/23=AA 30-85d. 09/08/23=AA30-90. 09/14/23=AA34-80: 10/01/23: 28-90 AA (end of session seated). 10/05/23: 28-90 AA (end of session supine). 10/12/23: 33d AA (end of session supine). 10/12/23: 33d AA (end of session supine). 10/15/23: 28d AA (end of  session supine)   LOWER EXTREMITY MMT:  MMT Right eval Left eval RT 07/27/23 Rt 09/02/23  Hip flexion 2  3 with limited ROM 4+  Hip extension 2   4+  Hip abduction 2   4+  Hip adduction      Hip internal rotation      Hip external rotation 2   4+  Knee flexion 2   4+ with limited ROM  Knee extension 2  3 with limited ROM 4+ with limited ROM  Ankle dorsiflexion      Ankle plantarflexion      Ankle inversion      Ankle eversion       (Blank rows = not tested)  LOWER EXTREMITY SPECIAL TESTS:  NT  FUNCTIONAL TESTS:  5 times sit to stand: TBA 2 minute walk test: TBA  5xSTS=24.7 c arm rest  2MWT=78' c RW GAIT: Distance walked: 33ft Assistive device utilized: Environmental consultant - 2 wheeled Level of assistance: SBA Comments: Antalgic over the R LE  TRANSFER: Mod A for mat table transfer c A for the R LE  TODAY'S TREATMENT:  [facility] Adult PT Treatment:                                                DATE: 10/15/23 Therapeutic Exercise: Nustep LAQ 5# 10 x 3  Standing hamstring curl 5# 10 x 3 Standing heel raise/toe lift 10 x 3 STS  2x10 c hand assist Standing hip abd 2x10 R 5#, L 3# each Standing hip ext 2x10 R 5#, L 3# each SLR 10 x 2 3# Manual Therapy: A/P and P/A fem/tib grade 4 mobs  PROM for flexion and extension Therapeutic Activity: 185' 5xSTS 11.8" s use of armrests  [facility] Adult PT Treatment:                                                DATE: 10/12/23 Therapeutic Exercise: LAQ 5# 10 x 2  STS  2x10 c  hand assist Standing hip abd 2x10 5# Standing knee lifts 2x10 5# SLR 10 x 2 Seated h/s curls red band 10 x 2 Manual Therapy: Patella mobs all planes  A/P and P/A fem/tib grade 3 mobs  PROM for flexion and extension  [facility] Adult PT Treatment:                                                DATE: 10/05/23 Therapeutic Exercise: LAQ 5# 10 x 2  SLR 10 x 2 STS x10 c hand assist Seated h/s curls red band 10 x 2 Hamstring stretch seated with strap- foot on stool  Seated  scoot stretch for knee flexion  Manual Therapy: Patella mobs all planes  A/P and P/A fem/tib grade 3 mobs  PROM for flexion and extension Therapeutic Activity: Gait training- Use of 1 ll bar to simulate walking with a SPC. Walking c [facility] CGA for safety 140ft. Increased antalgic gait pattern over 100'.  PATIENT EDUCATION:  Education details: Eval findings, POC, HEP, self care  Person educated: Patient Education method: Explanation, Demonstration, Tactile cues, Verbal cues, and Handouts Education comprehension: verbalized understanding, returned demonstration, verbal cues required, and tactile cues required  HOME EXERCISE PROGRAM: Access Code: [email or web address] URL: [email or web address] Date: 08/19/2023 Prepared by: Joellyn Rued  Exercises - Long Sitting Quad Set (Mirrored)  - 3 x daily - 7 x weekly - 1 sets - 10 reps - 5 hold - Supine Heel Slide with Strap  - 3 x daily - 7 x weekly - 1 sets - 10 reps - 10 hold - Supine Knee Extension Strengthening (Mirrored)  - 3 x daily - 7 x weekly - 1 sets - 10 reps - 3 hold - Supine Hip Abduction AROM (Mirrored)  - 3 x daily - 7 x weekly - 1 sets - 10 reps - 3 hold - Seated Knee Flexion Slide  - 3 x daily - 7 x weekly - 1 sets - 10 reps - 10 hold - Active Straight Leg Raise with Quad Set  - 3 x daily - 7 x weekly - 1 sets - 10 reps - Seated Knee Extension Stretch with Chair  - 3 x daily - 7 x weekly - 1 sets  ASSESSMENT:  CLINICAL IMPRESSION: 10/15/23: R knee ext is still markedly limited. Awaiting approval for JAS ext knee brace. End range is more springy than painful or hard, indicating the possibility of improvement. In addition to ROM efforts, PT addressed strengthening and reassessed functional mobility. For both 5xSTS and , each were found to be significantly improved meeting LTG #4. Despite the lack of improvement with R knee AROM, pt is making functional progress. Pt will continue to benefit from skilled PT to address impairments for  improved R knee/LE function.  Eval: Patient is a 78 y.o. female who was seen today for physical therapy evaluation and treatment for M17.11 (ICD-10-CM) - Unilateral primary osteoarthritis, right knee . RIGHT TOTAL KNEE ARTHROPLASTY on 07/12/2023. Pt presents with markedly decreased R knee AROM and strength, and decreased functional mobility. Pt will benefit from skilled PT 2w8 to address impairments to optimize R LE/knee function with less pain.   OBJECTIVE IMPAIRMENTS: decreased activity tolerance, decreased mobility, difficulty walking, decreased ROM, decreased strength, increased edema, obesity, and pain.   ACTIVITY LIMITATIONS: carrying, lifting, bending, sitting, standing, squatting, sleeping, stairs, transfers, bed mobility, bathing, toileting,  dressing, locomotion level, and caring for others  PARTICIPATION LIMITATIONS: meal prep, cleaning, laundry, and driving  PERSONAL FACTORS: Age, Fitness, Past/current experiences, Time since onset of injury/illness/exacerbation, and 1 comorbidity: high BMI  are also affecting patient's functional outcome.   REHAB POTENTIAL: Good  CLINICAL DECISION MAKING: Stable/uncomplicated  EVALUATION COMPLEXITY: Low   GOALS:  SHORT TERM GOALS: Target date: 08/06/23 Pt will be Ind in an initial HEP  Baseline: started Goal status: MET  2.  Pt will be able to complete a SLR and SAQ with or without quad lag Baseline: not able to complete 07/29/23: able to complete both with limited knee ext ROM present Goal status: MET  3.  Increase R knee AROM to 15-80d for improved R knee function Baseline: 30-50 10/01/23: AAROM 28-90 Goal status: PARTIALLY MET FOR FLEXION  LONG TERM GOALS: Target date: 10/22/23  Pt will be Ind in a final HEP to maintain achieved LOF Baseline: started Goal status: ONGOING  2.  Increase R hip and knee strength to 4/5 for improved function use of the R LE Baseline: see flow sheets 09/02/23: see flow sheets Goal status: MET  3.   Increased R knee AROM to 5-115d for appropriate fuctional mobility Baseline: 30-50d 09/02/23: 30-85d Goal status: ONGOING  4.  Improve 5xSTS by MCID of 5" and by MCID of 48ft as indication of improved functional mobility  Baseline: TBA when pt has progressed to gait c a [facility] 08/31/23: 5xSTS=24.7 c arm rest      2MWT=78' c RW 10/15/23:  5xSTS=11.8 s arm rest      2MWT= 185' c RW Goal status: MET  5.  Pt's FOTO score will improved to the predicted value of 43% as indication of improved function  Baseline: 22% Goal status: ONGOING  6.  Pt will be able to walk 440ft c or s a SPC and ascend/descend 12 steps c or s a SPC and c use of a HR for community mobility Baseline: 45ft c RW Goal status: ONGOING   PLAN:  PT FREQUENCY: 2x/week  PT DURATION: 6 weeks  PLANNED INTERVENTIONS: 97164- PT Re-evaluation, 97110-Therapeutic exercises, 97530- Therapeutic activity, 97535- Self Care, 16109- Manual therapy, [id number]- Gait training, 97014- Electrical stimulation (unattended), [id number]- Electrical stimulation (manual), 97016- Vasopneumatic device, Stair training, Dry Needling, Joint mobilization, Cryotherapy, and Moist heat  PLAN FOR NEXT SESSION: Review FOTO; assess response to HEP; progress therex as indicated; use of modalities, manual therapy; and TPDN as indicated. Check on JAS  Pranathi Winfree MS, PT 10/15/23 3:01 PM

## 2023-10-13 NOTE — Telephone Encounter (Signed)
 Last Fill: 07/16/2023  Eye exam: 12/24/2022 WNL   Labs: 10/05/2023 Glucose 110, Creat. 1.20, GFR 46, BUN/Creat. Ratio 11, RBC 3.51, Hgb 10.5, Hct 33.3  Next Visit: 12/01/2023  Last Visit: 03/16/2023  DX:Other systemic lupus erythematosus with other organ involvement   Current Dose per office note 03/16/2023: Hydroxychloroquine  level is elevated-1,200.  Recommend reducing plaquenil  to 1 tablet in the morning and half tablet in the evening Monday through Friday only.   Okay to refill Plaquenil ?

## 2023-10-15 ENCOUNTER — Ambulatory Visit: Payer: Medicare HMO

## 2023-10-15 DIAGNOSIS — M6281 Muscle weakness (generalized): Secondary | ICD-10-CM

## 2023-10-15 DIAGNOSIS — R262 Difficulty in walking, not elsewhere classified: Secondary | ICD-10-CM | POA: Diagnosis not present

## 2023-10-15 DIAGNOSIS — G8929 Other chronic pain: Secondary | ICD-10-CM

## 2023-10-15 DIAGNOSIS — M25561 Pain in right knee: Secondary | ICD-10-CM | POA: Diagnosis not present

## 2023-10-17 DIAGNOSIS — E6609 Other obesity due to excess calories: Secondary | ICD-10-CM | POA: Insufficient documentation

## 2023-10-17 DIAGNOSIS — K2931 Chronic superficial gastritis with bleeding: Secondary | ICD-10-CM | POA: Insufficient documentation

## 2023-10-17 NOTE — Assessment & Plan Note (Signed)
She is on Eliquis due to PAF.

## 2023-10-17 NOTE — Assessment & Plan Note (Signed)
Chronic, see HPI for EGD results. She will continue with Protonix twice daily x 1 month, then once daily.

## 2023-10-17 NOTE — Assessment & Plan Note (Signed)
TCM PERFORMED. A MEMBER OF THE CLINICAL TEAM SPOKE WITH THE PATIENT UPON DISCHARGE. DISCHARGE SUMMARY WAS REVIEWED IN FULL DETAIL DURING THE VISIT. MEDS RECONCILED AND COMPARED TO DISCHARGE MEDS. MEDICATION LIST WAS UPDATED AND REVIEWED WITH THE PATIENT. GREATER THAN 50% FACE TO FACE TIME WAS SPENT IN COUNSELING AND COORDINATION OF CARE. ALL QUESTIONS WERE ANSWERED TO THE SATISFACTION OF THE PATIENT.  She was transfused 2 units PRBCs.  EGD/colonoscopy results as per HPI. She was restarted on Eliquis after discussion with GI.  She will continue with Protonix 40mg  twice daily for 1 month followed by 40 mg daily.  She agrees to repeat CBC/CMP today.  She is advised to avoid NSAIDs.

## 2023-10-17 NOTE — Assessment & Plan Note (Signed)
 She is encouraged to strive for BMI less than 30 to decrease cardiac risk. Advised to aim for at least 150 minutes of exercise per week.

## 2023-10-17 NOTE — Assessment & Plan Note (Signed)
Chronic, now back on Eliquis. She is currently in sinus rhythm.

## 2023-10-17 NOTE — Assessment & Plan Note (Signed)
Chronic, fair control. She is reminded to follow low sodium diet. No med changes today. She will continue with irbesartan 300mg  daily, metoprolol XL 25mg  daily an amlodipine 5mg  daily.

## 2023-10-18 NOTE — Therapy (Signed)
OUTPATIENT PHYSICAL THERAPY LOWER EXTREMITY TREATMENT   Patient Name: Hannah [name] MRN: [id number] DOB:02/18/1946, 78 y.o., female Today's Date: 10/19/2023  Progress Note Reporting Period 08/24/24 to 10/19/23  See note below for Objective Data and Assessment of Progress/Goals.      END OF SESSION:  PT End of Session - 10/19/23 1507     Visit Number 20    Number of Visits 25    Date for PT Re-Evaluation 10/22/23    Authorization Type [employer]    Authorization Time Period Approved 12 visits 09/06/23-10/23/23    Authorization - Visit Number 7    Authorization - Number of Visits 12    Progress Note Due on Visit 20    PT Start Time 0302    PT Stop Time 0344    PT Time Calculation (min) 42 min    Activity Tolerance Patient tolerated treatment well    Behavior During Therapy WFL for tasks assessed/performed               Past Medical History:  Diagnosis Date   Allergy 25 years ago.   Anemia    Arthritis    Asthma    Atrial fibrillation (HCC)    Cataract Had cataracts surgery 2015 & 2016   Chronic kidney disease    Diabetes mellitus without complication (HCC)    Gout 12/17/2014   patient reported   Heart murmur 4 years ago   Hyperlipidemia    Hypertension    Hypothyroidism    Systemic lupus erythematosus (HCC)    Vitiligo    Past Surgical History:  Procedure Laterality Date   BIOPSY  09/26/2023   Procedure: BIOPSY;  Surgeon: Meryl Dare, MD;  Location: [facility];  Service: Gastroenterology;;   CATARACT EXTRACTION Bilateral 2015   COLONOSCOPY WITH PROPOFOL N/A 09/27/2023   Procedure: COLONOSCOPY WITH PROPOFOL;  Surgeon: [name], MD;  Location: [facility];  Service: Gastroenterology;  Laterality: N/A;   DILATION AND CURETTAGE OF UTERUS     DOPPLER ECHOCARDIOGRAPHY  05/2018   Internist to review with pt; potential heart murmur 06/20/18   ESOPHAGOGASTRODUODENOSCOPY (EGD) WITH PROPOFOL N/A 09/26/2023   Procedure:  ESOPHAGOGASTRODUODENOSCOPY (EGD) WITH PROPOFOL;  Surgeon: Meryl Dare, MD;  Location: [facility];  Service: Gastroenterology;  Laterality: N/A;   EYE SURGERY  Cataract Surgery, 2015 & 2016.   keratosis removal  2021   KNEE CLOSED REDUCTION Left 07/06/2022   Procedure: CLOSED MANIPULATION KNEE;  Surgeon: Ollen Gross, MD;  Location: [facility];  Service: Orthopedics;  Laterality: Left;   KNEE CLOSED REDUCTION Right 09/13/2023   Procedure: CLOSED MANIPULATION KNEE;  Surgeon: Ollen Gross, MD;  Location: [facility];  Service: Orthopedics;  Laterality: Right;   REPLACEMENT TOTAL KNEE Left    SKIN SURGERY  11/30/2018   left side of face   TOOTH EXTRACTION     TOTAL KNEE ARTHROPLASTY Left 02/02/2022   Procedure: TOTAL KNEE ARTHROPLASTY;  Surgeon: Ollen Gross, MD;  Location: [facility];  Service: Orthopedics;  Laterality: Left;   TOTAL KNEE ARTHROPLASTY Right 07/12/2023   Procedure: RIGHT TOTAL KNEE ARTHROPLASTY;  Surgeon: Ollen Gross, MD;  Location: [facility];  Service: Orthopedics;  Laterality: Right;   Patient Active Problem List   Diagnosis Date Noted   Class 1 obesity due to excess calories with serious comorbidity and body mass index (BMI) of 32.0 to 32.9 in adult 10/17/2023   Chronic superficial gastritis with bleeding 10/17/2023   Occult blood detected in feces by immunoassay 09/26/2023  ABLA (acute blood loss anemia) 09/23/2023   GI bleed 09/23/2023   Pure hypercholesterolemia 07/18/2023   Personal history of COVID-19 07/18/2023   Primary osteoarthritis of right knee 07/12/2023   Acquired thrombophilia (HCC) 01/12/2023   Arthrofibrosis of knee joint, right 07/06/2022   OA (osteoarthritis) of knee 02/02/2022   Primary osteoarthritis of left knee 02/02/2022   Chronic bilateral low back pain without sciatica 06/25/2021   Paresthesia and pain of extremity 06/25/2021   Atrial fibrillation (HCC) 04/16/2021   Demand ischemia (HCC) 03/26/2021   Unspecified atrial fibrillation (HCC)  03/26/2021   COVID-19 virus infection 03/26/2021   PVC (premature ventricular contraction) 05/06/2020   Type 2 diabetes mellitus with stage 3 chronic kidney disease, without long-term current use of insulin (HCC) 03/20/2020   Palpitations 03/20/2020   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   Primary hypothyroidism 03/20/2020   Vitiligo 03/20/2020   Class 2 severe obesity due to excess calories with serious comorbidity and body mass index (BMI) of 35.0 to 35.9 in adult (HCC) 10/31/2019   Iron deficiency anemia 08/12/2017   Osteoporosis 10/02/2016   Systemic lupus erythematosus (HCC) 10/02/2016   Rheumatoid arthritis involving multiple sites with positive rheumatoid factor (HCC) 10/02/2016   High risk medication use 10/02/2016   History of chronic kidney disease 10/02/2016   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/2016   Primary osteoarthritis of both knees 10/02/2016   Vitamin D deficiency 10/02/2016   History of diabetes mellitus 10/02/2016   History of hypertension 10/02/2016   History of asthma 10/02/2016   Absolute anemia    MGUS (monoclonal gammopathy of unknown significance)    Normocytic anemia 03/21/2015   Essential hypertension 03/20/2015   Abnormal CT of the chest 07/30/2012   Asthma 06/19/2012    PCP: Dorothyann Peng, MD  REFERRING PROVIDER: Ollen Gross, MD   REFERRING DIAG: M17.11 (ICD-10-CM) - Unilateral primary osteoarthritis, right knee   THERAPY DIAG:  Chronic pain of right knee  Muscle weakness (generalized)  Difficulty in walking, not elsewhere classified  Rationale for Evaluation and Treatment: Rehabilitation  ONSET DATE: 07/12/23  SUBJECTIVE:   SUBJECTIVE STATEMENT: 10/19/23: Pt offers no new report  09/14/23: Pt reports she underwent a knee manipulation yesterday. Pt states Dr. Lequita Halt [place] going to order a JAS knee brace. Pt reports she completes her ROM exs at home 4x daily.  EVAL: Pt reports she has been  completing her HEP 1x daily and using cold packs on a frequent basis for pain and swelling management.  PAIN:  Are you having pain? Yes: NPRS scale: 5/10 intermittent; pain range 5-10/10 Pain location: R knee Pain description: ache ,throb ,sharp Aggravating factors: movement, lifting, wt bearing Relieving factors: Pain medication, muscle relaxer, cold packs  PERTINENT HISTORY: High BMI, L LKA, Afib  PRECAUTIONS: Knee  RED FLAGS: None   WEIGHT BEARING RESTRICTIONS: No  FALLS:  Has patient fallen in last 6 months? No  LIVING ENVIRONMENT: Lives with: lives alone Lives in: House/apartment Stairs: No Has following equipment at home: Single point cane, Environmental consultant - 2 wheeled, Wheelchair (manual), and bed side commode  OCCUPATION: Retired  PLOF: Independent with household mobility with device  PATIENT GOALS: good use of my R knee  NEXT MD VISIT: 07/27/23  OBJECTIVE:  Note: Objective measures were completed at Evaluation unless otherwise noted.  DIAGNOSTIC FINDINGS: None for the R knee in Epic  PATIENT SURVEYS:  FOTO: Perceived function   22%, predicted   43%   COGNITION: Overall cognitive status: Within  functional limits for tasks assessed     SENSATION: WFL  EDEMA:  Significant R knee edema present   MUSCLE LENGTH: Hamstrings: Right NT deg; Left NT deg Maisie Fus test: Right NT deg; Left NT deg  POSTURE: rounded shoulders, forward head, and anterior pelvic tilt  PALPATION: TTP to the R peri-knee  LOWER EXTREMITY ROM:  Active ROM Right eval Left eval Rt 07/20/23 Rt 07/22/23 RT 07/27/23 Rt 07/29/23 08/03/23 Rt 08/06/23  Hip flexion          Hip extension          Hip abduction          Hip adduction          Hip internal rotation          Hip external rotation          Knee flexion 50  70 AA 80 AA75 AA85 AA 72 AA85  Knee extension 30  30 30 23  25d  30  Ankle dorsiflexion          Ankle plantarflexion          Ankle inversion          Ankle eversion            (Blank rows = not tested) 08/11/23:30-85d. 08/19/23=AAROM 30-90d. 08/31/23=AA90d   09/02/23=AA 30-85d. 09/08/23=AA30-90. 09/14/23=AA34-80: 10/01/23: 28-90 AA (end of session seated). 10/05/23: 28-90 AA (end of session supine). 10/12/23: 33d AA (end of session supine). 10/12/23: 33d AA (end of session supine). 10/15/23: 28d AA (end of session supine); 10/19/23:86-28d AA (end of session supine)   LOWER EXTREMITY MMT:  MMT Right eval Left eval RT 07/27/23 Rt 09/02/23  Hip flexion 2  3 with limited ROM 4+  Hip extension 2   4+  Hip abduction 2   4+  Hip adduction      Hip internal rotation      Hip external rotation 2   4+  Knee flexion 2   4+ with limited ROM  Knee extension 2  3 with limited ROM 4+ with limited ROM  Ankle dorsiflexion      Ankle plantarflexion      Ankle inversion      Ankle eversion       (Blank rows = not tested)  LOWER EXTREMITY SPECIAL TESTS:  NT  FUNCTIONAL TESTS:  5 times sit to stand: TBA 2 minute walk test: TBA  5xSTS=24.7 c arm rest  2MWT=78' c RW GAIT: Distance walked: 50ft Assistive device utilized: Environmental consultant - 2 wheeled Level of assistance: SBA Comments: Antalgic over the R LE  TRANSFER: Mod A for mat table transfer c A for the R LE  TODAY'S TREATMENT:  [facility] Adult PT Treatment:                                                DATE: 10/19/23 Therapeutic Exercise: Nustep 5 mins L5 UE/LE LAQ 5# 10 x 3  Standing hamstring curl 5# 10 x 3 Standing heel raise/toe lift 10 x 3 STS  2x10 c hand assist Standing hip abd 3x10 R 5# Seated knee flex with pt over pressure x5 20" Manual Therapy: A/P and P/A fem/tib grade 4 mobs  PROM for flexion and extension  [facility] Adult PT Treatment:  DATE: 10/15/23 Therapeutic Exercise: Nustep LAQ 5# 10 x 3  Standing hamstring curl 5# 10 x 3 Standing heel raise/toe lift 10 x 3 STS  2x10 c hand assist Standing hip abd 2x10 R 5#, L 3# each Standing hip ext 2x10 R 5#, L 3#  each SLR 10 x 2 3# Manual Therapy: A/P and P/A fem/tib grade 4 mobs  PROM for flexion and extension Therapeutic Activity: 185' 5xSTS 11.8" s use of armrests  [facility] Adult PT Treatment:                                                DATE: 10/12/23 Therapeutic Exercise: LAQ 5# 10 x 2  STS  2x10 c hand assist Standing hip abd 2x10 5# Standing knee lifts 2x10 5# SLR 10 x 2 Seated h/s curls red band 10 x 2 Manual Therapy: Patella mobs all planes  A/P and P/A fem/tib grade 3 mobs  PROM for flexion and extension  [facility] Adult PT Treatment:                                                DATE: 10/05/23 Therapeutic Exercise: LAQ 5# 10 x 2  SLR 10 x 2 STS x10 c hand assist Seated h/s curls red band 10 x 2 Hamstring stretch seated with strap- foot on stool  Seated scoot stretch for knee flexion  Manual Therapy: Patella mobs all planes  A/P and P/A fem/tib grade 3 mobs  PROM for flexion and extension Therapeutic Activity: Gait training- Use of 1 ll bar to simulate walking with a SPC. Walking c [facility] CGA for safety 167ft. Increased antalgic gait pattern over 100'.  PATIENT EDUCATION:  Education details: Eval findings, POC, HEP, self care  Person educated: Patient Education method: Explanation, Demonstration, Tactile cues, Verbal cues, and Handouts Education comprehension: verbalized understanding, returned demonstration, verbal cues required, and tactile cues required  HOME EXERCISE PROGRAM: Access Code: [email or web address] URL: [email or web address] Date: 08/19/2023 Prepared by: Joellyn Rued  Exercises - Long Sitting Quad Set (Mirrored)  - 3 x daily - 7 x weekly - 1 sets - 10 reps - 5 hold - Supine Heel Slide with Strap  - 3 x daily - 7 x weekly - 1 sets - 10 reps - 10 hold - Supine Knee Extension Strengthening (Mirrored)  - 3 x daily - 7 x weekly - 1 sets - 10 reps - 3 hold - Supine Hip Abduction AROM (Mirrored)  - 3 x daily - 7 x weekly - 1 sets - 10 reps - 3 hold - Seated  Knee Flexion Slide  - 3 x daily - 7 x weekly - 1 sets - 10 reps - 10 hold - Active Straight Leg Raise with Quad Set  - 3 x daily - 7 x weekly - 1 sets - 10 reps - Seated Knee Extension Stretch with Chair  - 3 x daily - 7 x weekly - 1 sets  ASSESSMENT:  CLINICAL IMPRESSION: 10/19/23: PT was completed R knee strengthening and ROM. Strength and functional mobility have made good progress, while R knee AROM has significant limitations. Pt is still waiting to hear about approval for a JAS knee ext brace. Pt will continue to benefit from  skilled PT to address impairments for improved R knee/LE function. Will focus the next PT session on addressing ROM limitation.  Eval: Patient is a 78 y.o. female who was seen today for physical therapy evaluation and treatment for M17.11 (ICD-10-CM) - Unilateral primary osteoarthritis, right knee . RIGHT TOTAL KNEE ARTHROPLASTY on 07/12/2023. Pt presents with markedly decreased R knee AROM and strength, and decreased functional mobility. Pt will benefit from skilled PT 2w8 to address impairments to optimize R LE/knee function with less pain.   OBJECTIVE IMPAIRMENTS: decreased activity tolerance, decreased mobility, difficulty walking, decreased ROM, decreased strength, increased edema, obesity, and pain.   ACTIVITY LIMITATIONS: carrying, lifting, bending, sitting, standing, squatting, sleeping, stairs, transfers, bed mobility, bathing, toileting, dressing, locomotion level, and caring for others  PARTICIPATION LIMITATIONS: meal prep, cleaning, laundry, and driving  PERSONAL FACTORS: Age, Fitness, Past/current experiences, Time since onset of injury/illness/exacerbation, and 1 comorbidity: high BMI  are also affecting patient's functional outcome.   REHAB POTENTIAL: Good  CLINICAL DECISION MAKING: Stable/uncomplicated  EVALUATION COMPLEXITY: Low   GOALS:  SHORT TERM GOALS: Target date: 08/06/23 Pt will be Ind in an initial HEP  Baseline: started Goal status:  MET  2.  Pt will be able to complete a SLR and SAQ with or without quad lag Baseline: not able to complete 07/29/23: able to complete both with limited knee ext ROM present Goal status: MET  3.  Increase R knee AROM to 15-80d for improved R knee function Baseline: 30-50 10/01/23: AAROM 28-90 Goal status: PARTIALLY MET FOR FLEXION  LONG TERM GOALS: Target date: 10/22/23  Pt will be Ind in a final HEP to maintain achieved LOF Baseline: started Goal status: ONGOING  2.  Increase R hip and knee strength to 4/5 for improved function use of the R LE Baseline: see flow sheets 09/02/23: see flow sheets Goal status: MET  3.  Increased R knee AROM to 5-115d for appropriate fuctional mobility Baseline: 30-50d 09/02/23: 30-85d Goal status: ONGOING  4.  Improve 5xSTS by MCID of 5" and by MCID of 25ft as indication of improved functional mobility  Baseline: TBA when pt has progressed to gait c a [facility] 08/31/23: 5xSTS=24.7 c arm rest      2MWT=78' c RW 10/15/23:  5xSTS=11.8 s arm rest      2MWT= 185' c RW Goal status: MET  5.  Pt's FOTO score will improved to the predicted value of 43% as indication of improved function  Baseline: 22% Goal status: ONGOING  6.  Pt will be able to walk 434ft c or s a SPC and ascend/descend 12 steps c or s a SPC and c use of a HR for community mobility Baseline: 40ft c RW Goal status: ONGOING   PLAN:  PT FREQUENCY: 2x/week  PT DURATION: 6 weeks  PLANNED INTERVENTIONS: 97164- PT Re-evaluation, 97110-Therapeutic exercises, 97530- Therapeutic activity, 97535- Self Care, 86578- Manual therapy, [id number]- Gait training, 97014- Electrical stimulation (unattended), [id number]- Electrical stimulation (manual), 97016- Vasopneumatic device, Stair training, Dry Needling, Joint mobilization, Cryotherapy, and Moist heat  PLAN FOR NEXT SESSION: Review FOTO; assess response to HEP; progress therex as indicated; use of modalities, manual therapy; and TPDN as indicated. Check  on JAS  Chayden Garrelts MS, PT 10/19/23 5:58 PM

## 2023-10-19 ENCOUNTER — Ambulatory Visit: Payer: Medicare HMO

## 2023-10-19 DIAGNOSIS — R262 Difficulty in walking, not elsewhere classified: Secondary | ICD-10-CM | POA: Diagnosis not present

## 2023-10-19 DIAGNOSIS — M25561 Pain in right knee: Secondary | ICD-10-CM | POA: Diagnosis not present

## 2023-10-19 DIAGNOSIS — G8929 Other chronic pain: Secondary | ICD-10-CM | POA: Diagnosis not present

## 2023-10-19 DIAGNOSIS — M6281 Muscle weakness (generalized): Secondary | ICD-10-CM

## 2023-10-21 NOTE — Therapy (Signed)
OUTPATIENT PHYSICAL THERAPY LOWER EXTREMITY TREATMENT/Re-Cert/Re-Auth   Patient Name: Hannah [name] MRN: [id number] DOB:1945/11/07, 78 y.o., female Today's Date:    END OF SESSION:  PT End of Session - 10/22/23 1345     Visit Number 21    Number of Visits 33    Date for PT Re-Evaluation 12/10/23    Authorization Type [employer]    Authorization Time Period Approved 12 visits 09/06/23-10/23/23    Authorization - Visit Number 8    Authorization - Number of Visits 12    Progress Note Due on Visit 30    PT Start Time 1330    PT Stop Time 1415    PT Time Calculation (min) 45 min    Activity Tolerance Patient tolerated treatment well    Behavior During Therapy WFL for tasks assessed/performed                Past Medical History:  Diagnosis Date   Allergy 25 years ago.   Anemia    Arthritis    Asthma    Atrial fibrillation (HCC)    Cataract Had cataracts surgery 2015 & 2016   Chronic kidney disease    Diabetes mellitus without complication (HCC)    Gout 12/17/2014   patient reported   Heart murmur 4 years ago   Hyperlipidemia    Hypertension    Hypothyroidism    Systemic lupus erythematosus (HCC)    Vitiligo    Past Surgical History:  Procedure Laterality Date   BIOPSY  09/26/2023   Procedure: BIOPSY;  Surgeon: Meryl Dare, MD;  Location: [facility];  Service: Gastroenterology;;   CATARACT EXTRACTION Bilateral 2015   COLONOSCOPY WITH PROPOFOL N/A 09/27/2023   Procedure: COLONOSCOPY WITH PROPOFOL;  Surgeon: [name], MD;  Location: [facility];  Service: Gastroenterology;  Laterality: N/A;   DILATION AND CURETTAGE OF UTERUS     DOPPLER ECHOCARDIOGRAPHY  05/2018   Internist to review with pt; potential heart murmur 06/20/18   ESOPHAGOGASTRODUODENOSCOPY (EGD) WITH PROPOFOL N/A 09/26/2023   Procedure: ESOPHAGOGASTRODUODENOSCOPY (EGD) WITH PROPOFOL;  Surgeon: Meryl Dare, MD;  Location: [facility];  Service: Gastroenterology;   Laterality: N/A;   EYE SURGERY  Cataract Surgery, 2015 & 2016.   keratosis removal  2021   KNEE CLOSED REDUCTION Left 07/06/2022   Procedure: CLOSED MANIPULATION KNEE;  Surgeon: Ollen Gross, MD;  Location: [facility];  Service: Orthopedics;  Laterality: Left;   KNEE CLOSED REDUCTION Right 09/13/2023   Procedure: CLOSED MANIPULATION KNEE;  Surgeon: Ollen Gross, MD;  Location: [facility];  Service: Orthopedics;  Laterality: Right;   REPLACEMENT TOTAL KNEE Left    SKIN SURGERY  11/30/2018   left side of face   TOOTH EXTRACTION     TOTAL KNEE ARTHROPLASTY Left 02/02/2022   Procedure: TOTAL KNEE ARTHROPLASTY;  Surgeon: Ollen Gross, MD;  Location: [facility];  Service: Orthopedics;  Laterality: Left;   TOTAL KNEE ARTHROPLASTY Right 07/12/2023   Procedure: RIGHT TOTAL KNEE ARTHROPLASTY;  Surgeon: Ollen Gross, MD;  Location: [facility];  Service: Orthopedics;  Laterality: Right;   Patient Active Problem List   Diagnosis Date Noted   Class 1 obesity due to excess calories with serious comorbidity and body mass index (BMI) of 32.0 to 32.9 in adult 10/17/2023   Chronic superficial gastritis with bleeding 10/17/2023   Occult blood detected in feces by immunoassay 09/26/2023   ABLA (acute blood loss anemia) 09/23/2023   GI bleed 09/23/2023   Pure hypercholesterolemia 07/18/2023   Personal  history of COVID-19 07/18/2023   Primary osteoarthritis of right knee 07/12/2023   Acquired thrombophilia (HCC) 01/12/2023   Arthrofibrosis of knee joint, right 07/06/2022   OA (osteoarthritis) of knee 02/02/2022   Primary osteoarthritis of left knee 02/02/2022   Chronic bilateral low back pain without sciatica 06/25/2021   Paresthesia and pain of extremity 06/25/2021   Atrial fibrillation (HCC) 04/16/2021   Demand ischemia (HCC) 03/26/2021   Unspecified atrial fibrillation (HCC) 03/26/2021   COVID-19 virus infection 03/26/2021   PVC (premature ventricular contraction) 05/06/2020   Type 2 diabetes mellitus with  stage 3 chronic kidney disease, without long-term current use of insulin (HCC) 03/20/2020   Palpitations 03/20/2020   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   Primary hypothyroidism 03/20/2020   Vitiligo 03/20/2020   Class 2 severe obesity due to excess calories with serious comorbidity and body mass index (BMI) of 35.0 to 35.9 in adult (HCC) 10/31/2019   Iron deficiency anemia 08/12/2017   Osteoporosis 10/02/2016   Systemic lupus erythematosus (HCC) 10/02/2016   Rheumatoid arthritis involving multiple sites with positive rheumatoid factor (HCC) 10/02/2016   High risk medication use 10/02/2016   History of chronic kidney disease 10/02/2016   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/2016   Primary osteoarthritis of both knees 10/02/2016   Vitamin D deficiency 10/02/2016   History of diabetes mellitus 10/02/2016   History of hypertension 10/02/2016   History of asthma 10/02/2016   Absolute anemia    MGUS (monoclonal gammopathy of unknown significance)    Normocytic anemia 03/21/2015   Essential hypertension 03/20/2015   Abnormal CT of the chest 07/30/2012   Asthma 06/19/2012    PCP: Dorothyann Peng, MD  REFERRING PROVIDER: Ollen Gross, MD   REFERRING DIAG: M17.11 (ICD-10-CM) - Unilateral primary osteoarthritis, right knee   THERAPY DIAG:  Chronic pain of right knee  Muscle weakness (generalized)  Difficulty in walking, not elsewhere classified  Rationale for Evaluation and Treatment: Rehabilitation  ONSET DATE: 07/12/23  SUBJECTIVE:   SUBJECTIVE STATEMENT: 10/22/23: Pt reports seeing Dr. Lequita Halt 10/20/23 and her beileves the JAS extension brace will be beneficial with increasing her R knee extension ROM.  EVAL: Pt reports she has been completing her HEP 1x daily and using cold packs on a frequent basis for pain and swelling management.  PAIN:  Are you having pain? Yes: NPRS scale: 5/10 intermittent; pain range 5-10/10 Pain location: R  knee Pain description: ache ,throb ,sharp Aggravating factors: movement, lifting, wt bearing Relieving factors: Pain medication, muscle relaxer, cold packs  PERTINENT HISTORY: High BMI, L LKA, Afib  PRECAUTIONS: Knee  RED FLAGS: None   WEIGHT BEARING RESTRICTIONS: No  FALLS:  Has patient fallen in last 6 months? No  LIVING ENVIRONMENT: Lives with: lives alone Lives in: House/apartment Stairs: No Has following equipment at home: Single point cane, Environmental consultant - 2 wheeled, Wheelchair (manual), and bed side commode  OCCUPATION: Retired  PLOF: Independent with household mobility with device  PATIENT GOALS: good use of my R knee  NEXT MD VISIT: 07/27/23  OBJECTIVE:  Note: Objective measures were completed at Evaluation unless otherwise noted.  DIAGNOSTIC FINDINGS: None for the R knee in Epic  PATIENT SURVEYS:  FOTO: Perceived function   22%, predicted   43%   COGNITION: Overall cognitive status: Within functional limits for tasks assessed     SENSATION: WFL  EDEMA:  Significant R knee edema present   MUSCLE LENGTH: Hamstrings: Right NT deg; Left NT deg Maisie Fus test: Right NT  deg; Left NT deg  POSTURE: rounded shoulders, forward head, and anterior pelvic tilt  PALPATION: TTP to the R peri-knee  LOWER EXTREMITY ROM:  Active ROM Right eval Left eval Rt 07/20/23 Rt 07/22/23 RT 07/27/23 Rt 07/29/23 08/03/23 Rt 08/06/23  Hip flexion          Hip extension          Hip abduction          Hip adduction          Hip internal rotation          Hip external rotation          Knee flexion 50  70 AA 80 AA75 AA85 AA 72 AA85  Knee extension 30  30 30 23  25d  30  Ankle dorsiflexion          Ankle plantarflexion          Ankle inversion          Ankle eversion           (Blank rows = not tested) 08/11/23:30-85d. 08/19/23=AAROM 30-90d. 08/31/23=AA90d   09/02/23=AA 30-85d. 09/08/23=AA30-90. 09/14/23=AA34-80: 10/01/23: 28-90 AA (end of session seated). 10/05/23: 28-90 AA  (end of session supine). 10/12/23: 33d AA (end of session supine). 10/12/23: 33d AA (end of session supine). 10/15/23: 28d AA (end of session supine); 10/19/23:86-28d AA (end of session supine). 10/22/23:86-28d AA (end of session supine)   LOWER EXTREMITY MMT:  MMT Right eval Left eval RT 07/27/23 Rt 09/02/23  Hip flexion 2  3 with limited ROM 4+  Hip extension 2   4+  Hip abduction 2   4+  Hip adduction      Hip internal rotation      Hip external rotation 2   4+  Knee flexion 2   4+ with limited ROM  Knee extension 2  3 with limited ROM 4+ with limited ROM  Ankle dorsiflexion      Ankle plantarflexion      Ankle inversion      Ankle eversion       (Blank rows = not tested)  LOWER EXTREMITY SPECIAL TESTS:  NT  FUNCTIONAL TESTS:  5 times sit to stand: TBA 2 minute walk test: TBA  5xSTS=24.7 c arm rest  2MWT=78' c RW GAIT: Distance walked: 62ft Assistive device utilized: Environmental consultant - 2 wheeled Level of assistance: SBA Comments: Antalgic over the R LE  TRANSFER: Mod A for mat table transfer c A for the R LE  TODAY'S TREATMENT:  [facility] Adult PT Treatment:                                                DATE: 10/22/23 Therapeutic Exercise: Nustep 5 mins L5 UE/LE LAQ 5# 10 x 3  Standing hamstring curl 5# 10 x 3 Standing heel raise/toe lift 10 x 3 Standing knee lifts 10 x 3 Standing hip abd 3x10 R 5# STS  2x10 c hand assist Manual Therapy: A/P and P/A fem/tib grade 4 mobs  PROM for flexion and extension Therapeutic Activity: FOTO reassessed and reviewed Prolonged walking c RW, 327ft, limited by medial R knee pain   [facility] Adult PT Treatment:  DATE: 10/19/23 Therapeutic Exercise: Nustep 5 mins L5 UE/LE LAQ 5# 10 x 3  Standing hamstring curl 5# 10 x 3 Standing heel raise/toe lift 10 x 3 STS  2x10 c hand assist Standing hip abd 3x10 R 5# Seated knee flex with pt over pressure x5 20" Manual Therapy: A/P and P/A fem/tib grade 4 mobs   PROM for flexion and extension  [facility] Adult PT Treatment:                                                DATE: 10/15/23 Therapeutic Exercise: Nustep LAQ 5# 10 x 3  Standing hamstring curl 5# 10 x 3 Standing heel raise/toe lift 10 x 3 STS  2x10 c hand assist Standing hip abd 2x10 R 5#, L 3# each Standing hip ext 2x10 R 5#, L 3# each SLR 10 x 2 3# Manual Therapy: A/P and P/A fem/tib grade 4 mobs  PROM for flexion and extension Therapeutic Activity: 185' 5xSTS 11.8" s use of armrests  [facility] Adult PT Treatment:                                                DATE: 10/12/23 Therapeutic Exercise: LAQ 5# 10 x 2  STS  2x10 c hand assist Standing hip abd 2x10 5# Standing knee lifts 2x10 5# SLR 10 x 2 Seated h/s curls red band 10 x 2 Manual Therapy: Patella mobs all planes  A/P and P/A fem/tib grade 3 mobs  PROM for flexion and extension  PATIENT EDUCATION:  Education details: Eval findings, POC, HEP, self care  Person educated: Patient Education method: Explanation, Demonstration, Tactile cues, Verbal cues, and Handouts Education comprehension: verbalized understanding, returned demonstration, verbal cues required, and tactile cues required  HOME EXERCISE PROGRAM: Access Code: [email or web address] URL: [email or web address] Date: 08/19/2023 Prepared by: Joellyn Rued  Exercises - Long Sitting Quad Set (Mirrored)  - 3 x daily - 7 x weekly - 1 sets - 10 reps - 5 hold - Supine Heel Slide with Strap  - 3 x daily - 7 x weekly - 1 sets - 10 reps - 10 hold - Supine Knee Extension Strengthening (Mirrored)  - 3 x daily - 7 x weekly - 1 sets - 10 reps - 3 hold - Supine Hip Abduction AROM (Mirrored)  - 3 x daily - 7 x weekly - 1 sets - 10 reps - 3 hold - Seated Knee Flexion Slide  - 3 x daily - 7 x weekly - 1 sets - 10 reps - 10 hold - Active Straight Leg Raise with Quad Set  - 3 x daily - 7 x weekly - 1 sets - 10 reps - Seated Knee Extension Stretch with Chair  - 3 x daily - 7 x  weekly - 1 sets  ASSESSMENT:  CLINICAL IMPRESSION: 10/22/23: Functionally, the pt has made good progress re: walking tolerance, ability to come sitting to/from standing, and with her FOTO score, perceived functional ability, meeting this goal. See LTGs below. Pt has now received approval for a JAS knee extension brace which she will receive on 10/26/23. Pt will continue to need OPPT to optimize improvement of R knee extension ROM which is significantly limited at 28d lacking. Pt  has not been able to progress to walking without a RW at this time due to the possibility of the R knee giving out during stance phase with an extension contracture of that degree. Pt tolerated PT today without adverse effects. Pt will continue to benefit from skilled PT 2w6 to address R knee ROM impairments for improved functional mobility without need of an assistive device.   Eval: Patient is a 78 y.o. female who was seen today for physical therapy evaluation and treatment for M17.11 (ICD-10-CM) - Unilateral primary osteoarthritis, right knee . RIGHT TOTAL KNEE ARTHROPLASTY on 07/12/2023. Pt presents with markedly decreased R knee AROM and strength, and decreased functional mobility. Pt will benefit from skilled PT 2w8 to address impairments to optimize R LE/knee function with less pain.   OBJECTIVE IMPAIRMENTS: decreased activity tolerance, decreased mobility, difficulty walking, decreased ROM, decreased strength, increased edema, obesity, and pain.   ACTIVITY LIMITATIONS: carrying, lifting, bending, sitting, standing, squatting, sleeping, stairs, transfers, bed mobility, bathing, toileting, dressing, locomotion level, and caring for others  PARTICIPATION LIMITATIONS: meal prep, cleaning, laundry, and driving  PERSONAL FACTORS: Age, Fitness, Past/current experiences, Time since onset of injury/illness/exacerbation, and 1 comorbidity: high BMI  are also affecting patient's functional outcome.   REHAB POTENTIAL:  Good  CLINICAL DECISION MAKING: Stable/uncomplicated  EVALUATION COMPLEXITY: Low   GOALS:  SHORT TERM GOALS: Target date: 08/06/23 Pt will be Ind in an initial HEP  Baseline: started Goal status: MET  2.  Pt will be able to complete a SLR and SAQ with or without quad lag Baseline: not able to complete 07/29/23: able to complete both with limited knee ext ROM present Goal status: MET  3.  Increase R knee AROM to 15-80d for improved R knee function Baseline: 30-50 10/01/23: AAROM 28-90 Goal status: PARTIALLY MET FOR FLEXION  LONG TERM GOALS: Target date: 10/22/23  Pt will be Ind in a final HEP to maintain achieved LOF Baseline: started Goal status: ONGOING  2.  Increase R hip and knee strength to 4/5 for improved function use of the R LE Baseline: see flow sheets 09/02/23: see flow sheets Goal status: MET  3.  Increased R knee AROM to 5-115d for appropriate fuctional mobility Baseline: 30-50d 09/02/23: 30-85d 10/22/23: 28-86d Goal status: NOT MET  4.  Improve 5xSTS by MCID of 5" and by MCID of 80ft as indication of improved functional mobility  Baseline: TBA when pt has progressed to gait c a [facility] 08/31/23: 5xSTS=24.7 c arm rest      2MWT=78' c RW 10/15/23:  5xSTS=11.8 s arm rest      2MWT= 185' c RW Goal status: MET  5.  Pt's FOTO score will improved to the predicted value of 43% as indication of improved function  Baseline: 22% 10/22/23:48% Goal status: MET  6.  Pt will be able to walk 478ft c or s a SPC and ascend/descend 12 steps c or s a SPC and c use of a HR for community mobility Baseline: 60ft c RW 10/22/23: 316ft  Goal status: ONGOING   PLAN:  PT FREQUENCY: 2x/week  PT DURATION: 6 weeks  PLANNED INTERVENTIONS: 97164- PT Re-evaluation, 97110-Therapeutic exercises, 97530- Therapeutic activity, 97535- Self Care, 16109- Manual therapy, [id number]- Gait training, 97014- Electrical stimulation (unattended), [id number]- Electrical stimulation (manual), 97016-  Vasopneumatic device, Stair training, Dry Needling, Joint mobilization, Cryotherapy, and Moist heat  PLAN FOR NEXT SESSION: Review FOTO; assess response to HEP; progress therex as indicated; use of modalities, manual therapy; and TPDN as indicated.  Referring diagnosis? M17.11 (ICD-10-CM) - Unilateral primary osteoarthritis, right knee  Treatment diagnosis? (if different than referring diagnosis)  Chronic pain of right knee Muscle weakness (generalized) Difficulty in walking, not elsewhere classified  What was this (referring dx) caused by? [x]  Surgery []  Fall []  Ongoing issue []  Arthritis []  Other: ____________  Laterality: [x]  Rt []  Lt []  Both  Check all possible CPT codes:  *CHOOSE 10 OR LESS*    See Planned Interventions listed in the Plan section of the Evaluation.    Kyler Germer MS, PT 10/22/23 3:01 PM

## 2023-10-22 ENCOUNTER — Ambulatory Visit: Payer: Medicare HMO

## 2023-10-22 DIAGNOSIS — M25561 Pain in right knee: Secondary | ICD-10-CM | POA: Diagnosis not present

## 2023-10-22 DIAGNOSIS — G8929 Other chronic pain: Secondary | ICD-10-CM

## 2023-10-22 DIAGNOSIS — R262 Difficulty in walking, not elsewhere classified: Secondary | ICD-10-CM

## 2023-10-22 DIAGNOSIS — M6281 Muscle weakness (generalized): Secondary | ICD-10-CM

## 2023-10-22 NOTE — Progress Notes (Signed)
Pharmacy Medication Assistance Program Note    10/22/2023  Patient ID: Hannah [name], female   DOB: 06-18-1946, 78 y.o.   MRN: [id number]     08/16/2023 08/23/2023 10/12/2023  Outreach [facility]  Initial Outreach Date ([facility] [facility]) 08/16/2023 08/16/2023   Manufacturer Medication [facility] [employer] Nurse, adult Drugs   Farxiga  Dose of Farxiga   10mg /day  [employer] [facility] Jardiance Jardiance   Dose of Jardiance 10 mg 10 mg   Type of Sport and exercise psychologist Assistance  Date Application Sent to Patient 08/16/2023 08/16/2023 10/12/2023  Application Items Requested Application Application Application;Proof of Income  Date Application Sent to Prescriber  08/23/2023 10/12/2023  Name of Prescriber  Robyn Gillian Shields  Date Application Received From Patient  08/23/2023 10/22/2023  Application Items Received From Patient  Application   Date Application Received From Provider  08/23/2023 10/13/2023  Date Application Submitted to Manufacturer  08/23/2023 10/22/2023  Method Application Sent to Manufacturer  Fax Fax  Patient Assistance Determination   Approved  Approval Start Date   10/22/2023  Approval End Date   09/27/2024  Patient Notification Method   Telephone Call  Telephone Call Outcome   Left Voicemail     Signature

## 2023-10-22 NOTE — Telephone Encounter (Signed)
PAP: Application for Marcelline Deist has been submitted to [employer] ([employer]), via fax

## 2023-10-22 NOTE — Telephone Encounter (Signed)
PAP: Patient assistance application for Marcelline Deist has been approved by PAP Companies: [employer] from 10/22/2023 to 09/27/2024. Medication should be delivered to PAP Delivery: Home. For further shipping updates, please contact [employer] ([employer]) at [phone]. Patient ID is: [id number]

## 2023-10-24 NOTE — Therapy (Signed)
OUTPATIENT PHYSICAL THERAPY LOWER EXTREMITY TREATMENT/Re-Cert/Re-Auth   Patient Name: ALAZIA CROCKET MRN: [id number] DOB:1946-09-06, 78 y.o., female Today's Date:    END OF SESSION:       Past Medical History:  Diagnosis Date   Allergy 25 years ago.   Anemia    Arthritis    Asthma    Atrial fibrillation (HCC)    Cataract Had cataracts surgery 2015 & 2016   Chronic kidney disease    Diabetes mellitus without complication (HCC)    Gout 12/17/2014   patient reported   Heart murmur 4 years ago   Hyperlipidemia    Hypertension    Hypothyroidism    Systemic lupus erythematosus (HCC)    Vitiligo    Past Surgical History:  Procedure Laterality Date   BIOPSY  09/26/2023   Procedure: BIOPSY;  Surgeon: Meryl Dare, MD;  Location: [facility];  Service: Gastroenterology;;   CATARACT EXTRACTION Bilateral 2015   COLONOSCOPY WITH PROPOFOL N/A 09/27/2023   Procedure: COLONOSCOPY WITH PROPOFOL;  Surgeon: [name], MD;  Location: [facility];  Service: Gastroenterology;  Laterality: N/A;   DILATION AND CURETTAGE OF UTERUS     DOPPLER ECHOCARDIOGRAPHY  05/2018   Internist to review with pt; potential heart murmur 06/20/18   ESOPHAGOGASTRODUODENOSCOPY (EGD) WITH PROPOFOL N/A 09/26/2023   Procedure: ESOPHAGOGASTRODUODENOSCOPY (EGD) WITH PROPOFOL;  Surgeon: Meryl Dare, MD;  Location: [facility];  Service: Gastroenterology;  Laterality: N/A;   EYE SURGERY  Cataract Surgery, 2015 & 2016.   keratosis removal  2021   KNEE CLOSED REDUCTION Left 07/06/2022   Procedure: CLOSED MANIPULATION KNEE;  Surgeon: Ollen Gross, MD;  Location: [facility];  Service: Orthopedics;  Laterality: Left;   KNEE CLOSED REDUCTION Right 09/13/2023   Procedure: CLOSED MANIPULATION KNEE;  Surgeon: Ollen Gross, MD;  Location: [facility];  Service: Orthopedics;  Laterality: Right;   REPLACEMENT TOTAL KNEE Left    SKIN SURGERY  11/30/2018   left side of face   TOOTH EXTRACTION     TOTAL KNEE  ARTHROPLASTY Left 02/02/2022   Procedure: TOTAL KNEE ARTHROPLASTY;  Surgeon: Ollen Gross, MD;  Location: [facility];  Service: Orthopedics;  Laterality: Left;   TOTAL KNEE ARTHROPLASTY Right 07/12/2023   Procedure: RIGHT TOTAL KNEE ARTHROPLASTY;  Surgeon: Ollen Gross, MD;  Location: [facility];  Service: Orthopedics;  Laterality: Right;   Patient Active Problem List   Diagnosis Date Noted   Class 1 obesity due to excess calories with serious comorbidity and body mass index (BMI) of 32.0 to 32.9 in adult 10/17/2023   Chronic superficial gastritis with bleeding 10/17/2023   Occult blood detected in feces by immunoassay 09/26/2023   ABLA (acute blood loss anemia) 09/23/2023   GI bleed 09/23/2023   Pure hypercholesterolemia 07/18/2023   Personal history of COVID-19 07/18/2023   Primary osteoarthritis of right knee 07/12/2023   Acquired thrombophilia (HCC) 01/12/2023   Arthrofibrosis of knee joint, right 07/06/2022   OA (osteoarthritis) of knee 02/02/2022   Primary osteoarthritis of left knee 02/02/2022   Chronic bilateral low back pain without sciatica 06/25/2021   Paresthesia and pain of extremity 06/25/2021   Atrial fibrillation (HCC) 04/16/2021   Demand ischemia (HCC) 03/26/2021   Unspecified atrial fibrillation (HCC) 03/26/2021   COVID-19 virus infection 03/26/2021   PVC (premature ventricular contraction) 05/06/2020   Type 2 diabetes mellitus with stage 3 chronic kidney disease, without long-term current use of insulin (HCC) 03/20/2020   Palpitations 03/20/2020   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  Primary hypothyroidism 03/20/2020   Vitiligo 03/20/2020   Class 2 severe obesity due to excess calories with serious comorbidity and body mass index (BMI) of 35.0 to 35.9 in adult (HCC) 10/31/2019   Iron deficiency anemia 08/12/2017   Osteoporosis 10/02/2016   Systemic lupus erythematosus (HCC) 10/02/2016   Rheumatoid arthritis involving multiple sites with positive  rheumatoid factor (HCC) 10/02/2016   High risk medication use 10/02/2016   History of chronic kidney disease 10/02/2016   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/2016   Primary osteoarthritis of both knees 10/02/2016   Vitamin D deficiency 10/02/2016   History of diabetes mellitus 10/02/2016   History of hypertension 10/02/2016   History of asthma 10/02/2016   Absolute anemia    MGUS (monoclonal gammopathy of unknown significance)    Normocytic anemia 03/21/2015   Essential hypertension 03/20/2015   Abnormal CT of the chest 07/30/2012   Asthma 06/19/2012    PCP: Dorothyann Peng, MD  REFERRING PROVIDER: Ollen Gross, MD   REFERRING DIAG: M17.11 (ICD-10-CM) - Unilateral primary osteoarthritis, right knee   THERAPY DIAG:  No diagnosis found.  Rationale for Evaluation and Treatment: Rehabilitation  ONSET DATE: 07/12/23  SUBJECTIVE:   SUBJECTIVE STATEMENT: 10/22/23: Pt reports seeing Dr. Lequita Halt 10/20/23 and her beileves the JAS extension brace will be beneficial with increasing her R knee extension ROM.  EVAL: Pt reports she has been completing her HEP 1x daily and using cold packs on a frequent basis for pain and swelling management.  PAIN:  Are you having pain? Yes: NPRS scale: 5/10 intermittent; pain range 5-10/10 Pain location: R knee Pain description: ache ,throb ,sharp Aggravating factors: movement, lifting, wt bearing Relieving factors: Pain medication, muscle relaxer, cold packs  PERTINENT HISTORY: High BMI, L LKA, Afib  PRECAUTIONS: Knee  RED FLAGS: None   WEIGHT BEARING RESTRICTIONS: No  FALLS:  Has patient fallen in last 6 months? No  LIVING ENVIRONMENT: Lives with: lives alone Lives in: House/apartment Stairs: No Has following equipment at home: Single point cane, Environmental consultant - 2 wheeled, Wheelchair (manual), and bed side commode  OCCUPATION: Retired  PLOF: Independent with household mobility with device  PATIENT GOALS: good  use of my R knee  NEXT MD VISIT: 07/27/23  OBJECTIVE:  Note: Objective measures were completed at Evaluation unless otherwise noted.  DIAGNOSTIC FINDINGS: None for the R knee in Epic  PATIENT SURVEYS:  FOTO: Perceived function   22%, predicted   43%   COGNITION: Overall cognitive status: Within functional limits for tasks assessed     SENSATION: WFL  EDEMA:  Significant R knee edema present   MUSCLE LENGTH: Hamstrings: Right NT deg; Left NT deg Thomas test: Right NT deg; Left NT deg  POSTURE: rounded shoulders, forward head, and anterior pelvic tilt  PALPATION: TTP to the R peri-knee  LOWER EXTREMITY ROM:  Active ROM Right eval Left eval Rt 07/20/23 Rt 07/22/23 RT 07/27/23 Rt 07/29/23 08/03/23 Rt 08/06/23  Hip flexion          Hip extension          Hip abduction          Hip adduction          Hip internal rotation          Hip external rotation          Knee flexion 50  70 AA 80 AA75 AA85 AA 72 AA85  Knee extension 30  30 30 23  25d  30  Ankle dorsiflexion  Ankle plantarflexion          Ankle inversion          Ankle eversion           (Blank rows = not tested) 08/11/23:30-85d. 08/19/23=AAROM 30-90d. 08/31/23=AA90d   09/02/23=AA 30-85d. 09/08/23=AA30-90. 09/14/23=AA34-80: 10/01/23: 28-90 AA (end of session seated). 10/05/23: 28-90 AA (end of session supine). 10/12/23: 33d AA (end of session supine). 10/12/23: 33d AA (end of session supine). 10/15/23: 28d AA (end of session supine); 10/19/23:86-28d AA (end of session supine). 10/22/23:86-28d AA (end of session supine)   LOWER EXTREMITY MMT:  MMT Right eval Left eval RT 07/27/23 Rt 09/02/23  Hip flexion 2  3 with limited ROM 4+  Hip extension 2   4+  Hip abduction 2   4+  Hip adduction      Hip internal rotation      Hip external rotation 2   4+  Knee flexion 2   4+ with limited ROM  Knee extension 2  3 with limited ROM 4+ with limited ROM  Ankle dorsiflexion      Ankle plantarflexion      Ankle  inversion      Ankle eversion       (Blank rows = not tested)  LOWER EXTREMITY SPECIAL TESTS:  NT  FUNCTIONAL TESTS:  5 times sit to stand: TBA 2 minute walk test: TBA  5xSTS=24.7 c arm rest  2MWT=78' c RW GAIT: Distance walked: 5ft Assistive device utilized: Environmental consultant - 2 wheeled Level of assistance: SBA Comments: Antalgic over the R LE  TRANSFER: Mod A for mat table transfer c A for the R LE  TODAY'S TREATMENT:  [facility] Adult PT Treatment:                                                DATE: 10/26/23 Therapeutic Exercise: Nustep 5 mins L5 UE/LE LAQ 5# 10 x 3  Standing hamstring curl 5# 10 x 3 Standing heel raise/toe lift 10 x 3 Standing knee lifts 10 x 3 Standing hip abd 3x10 R 5# STS  2x10 c hand assist Manual Therapy: A/P and P/A fem/tib grade 4 mobs  PROM for flexion and extension Therapeutic Activity: FOTO reassessed and reviewed Prolonged walking c RW, 374ft, limited by medial R knee pain  Therapeutic Exercise: *** Manual Therapy: *** Neuromuscular re-ed: *** Therapeutic Activity: *** Modalities: *** Self Care: ***  [facility] Adult PT Treatment:                                                DATE: 10/22/23 Therapeutic Exercise: Nustep 5 mins L5 UE/LE LAQ 5# 10 x 3  Standing hamstring curl 5# 10 x 3 Standing heel raise/toe lift 10 x 3 Standing knee lifts 10 x 3 Standing hip abd 3x10 R 5# STS  2x10 c hand assist Manual Therapy: A/P and P/A fem/tib grade 4 mobs  PROM for flexion and extension Therapeutic Activity: FOTO reassessed and reviewed Prolonged walking c RW, 374ft, limited by medial R knee pain   [facility] Adult PT Treatment:  DATE: 10/19/23 Therapeutic Exercise: Nustep 5 mins L5 UE/LE LAQ 5# 10 x 3  Standing hamstring curl 5# 10 x 3 Standing heel raise/toe lift 10 x 3 STS  2x10 c hand assist Standing hip abd 3x10 R 5# Seated knee flex with pt over pressure x5 20" Manual Therapy: A/P and P/A fem/tib  grade 4 mobs  PROM for flexion and extension  [facility] Adult PT Treatment:                                                DATE: 10/15/23 Therapeutic Exercise: Nustep LAQ 5# 10 x 3  Standing hamstring curl 5# 10 x 3 Standing heel raise/toe lift 10 x 3 STS  2x10 c hand assist Standing hip abd 2x10 R 5#, L 3# each Standing hip ext 2x10 R 5#, L 3# each SLR 10 x 2 3# Manual Therapy: A/P and P/A fem/tib grade 4 mobs  PROM for flexion and extension Therapeutic Activity: 185' 5xSTS 11.8" s use of armrests  PATIENT EDUCATION:  Education details: Eval findings, POC, HEP, self care  Person educated: Patient Education method: Explanation, Demonstration, Tactile cues, Verbal cues, and Handouts Education comprehension: verbalized understanding, returned demonstration, verbal cues required, and tactile cues required  HOME EXERCISE PROGRAM: Access Code: [email or web address] URL: [email or web address] Date: 08/19/2023 Prepared by: Joellyn Rued  Exercises - Long Sitting Quad Set (Mirrored)  - 3 x daily - 7 x weekly - 1 sets - 10 reps - 5 hold - Supine Heel Slide with Strap  - 3 x daily - 7 x weekly - 1 sets - 10 reps - 10 hold - Supine Knee Extension Strengthening (Mirrored)  - 3 x daily - 7 x weekly - 1 sets - 10 reps - 3 hold - Supine Hip Abduction AROM (Mirrored)  - 3 x daily - 7 x weekly - 1 sets - 10 reps - 3 hold - Seated Knee Flexion Slide  - 3 x daily - 7 x weekly - 1 sets - 10 reps - 10 hold - Active Straight Leg Raise with Quad Set  - 3 x daily - 7 x weekly - 1 sets - 10 reps - Seated Knee Extension Stretch with Chair  - 3 x daily - 7 x weekly - 1 sets  ASSESSMENT:  CLINICAL IMPRESSION: 10/22/23: Functionally, the pt has made good progress re: walking tolerance, ability to come sitting to/from standing, and with her FOTO score, perceived functional ability, meeting this goal. See LTGs below. Pt has now received approval for a JAS knee extension brace which she will receive on  10/26/23. Pt will continue to need OPPT to optimize improvement of R knee extension ROM which is significantly limited at 28d lacking. Pt has not been able to progress to walking without a RW at this time due to the possibility of the R knee giving out during stance phase with an extension contracture of that degree. Pt tolerated PT today without adverse effects. Pt will continue to benefit from skilled PT 2w6 to address R knee ROM impairments for improved functional mobility without need of an assistive device.   Eval: Patient is a 78 y.o. female who was seen today for physical therapy evaluation and treatment for M17.11 (ICD-10-CM) - Unilateral primary osteoarthritis, right knee . RIGHT TOTAL KNEE ARTHROPLASTY on 07/12/2023. Pt presents with markedly decreased R knee AROM and  strength, and decreased functional mobility. Pt will benefit from skilled PT 2w8 to address impairments to optimize R LE/knee function with less pain.   OBJECTIVE IMPAIRMENTS: decreased activity tolerance, decreased mobility, difficulty walking, decreased ROM, decreased strength, increased edema, obesity, and pain.   ACTIVITY LIMITATIONS: carrying, lifting, bending, sitting, standing, squatting, sleeping, stairs, transfers, bed mobility, bathing, toileting, dressing, locomotion level, and caring for others  PARTICIPATION LIMITATIONS: meal prep, cleaning, laundry, and driving  PERSONAL FACTORS: Age, Fitness, Past/current experiences, Time since onset of injury/illness/exacerbation, and 1 comorbidity: high BMI  are also affecting patient's functional outcome.   REHAB POTENTIAL: Good  CLINICAL DECISION MAKING: Stable/uncomplicated  EVALUATION COMPLEXITY: Low   GOALS:  SHORT TERM GOALS: Target date: 08/06/23 Pt will be Ind in an initial HEP  Baseline: started Goal status: MET  2.  Pt will be able to complete a SLR and SAQ with or without quad lag Baseline: not able to complete 07/29/23: able to complete both with limited  knee ext ROM present Goal status: MET  3.  Increase R knee AROM to 15-80d for improved R knee function Baseline: 30-50 10/01/23: AAROM 28-90 Goal status: PARTIALLY MET FOR FLEXION  LONG TERM GOALS: Target date: 10/22/23  Pt will be Ind in a final HEP to maintain achieved LOF Baseline: started Goal status: ONGOING  2.  Increase R hip and knee strength to 4/5 for improved function use of the R LE Baseline: see flow sheets 09/02/23: see flow sheets Goal status: MET  3.  Increased R knee AROM to 5-115d for appropriate fuctional mobility Baseline: 30-50d 09/02/23: 30-85d 10/22/23: 28-86d Goal status: NOT MET  4.  Improve 5xSTS by MCID of 5" and by MCID of 71ft as indication of improved functional mobility  Baseline: TBA when pt has progressed to gait c a [facility] 08/31/23: 5xSTS=24.7 c arm rest      2MWT=78' c RW 10/15/23:  5xSTS=11.8 s arm rest      2MWT= 185' c RW Goal status: MET  5.  Pt's FOTO score will improved to the predicted value of 43% as indication of improved function  Baseline: 22% 10/22/23:48% Goal status: MET  6.  Pt will be able to walk 473ft c or s a SPC and ascend/descend 12 steps c or s a SPC and c use of a HR for community mobility Baseline: 63ft c RW 10/22/23: 353ft  Goal status: ONGOING   PLAN:  PT FREQUENCY: 2x/week  PT DURATION: 6 weeks  PLANNED INTERVENTIONS: [id number]- PT Re-evaluation, 97110-Therapeutic exercises, 97530- Therapeutic activity, 97535- Self Care, [id number]- Manual therapy, [id number]- Gait training, 97014- Electrical stimulation (unattended), [id number]- Electrical stimulation (manual), 97016- Vasopneumatic device, Stair training, Dry Needling, Joint mobilization, Cryotherapy, and Moist heat  PLAN FOR NEXT SESSION: Review FOTO; assess response to HEP; progress therex as indicated; use of modalities, manual therapy; and TPDN as indicated.    Shaquitta Burbridge MS, PT 10/24/23 10:31 PM

## 2023-10-26 ENCOUNTER — Ambulatory Visit: Payer: Medicare HMO

## 2023-10-26 DIAGNOSIS — R262 Difficulty in walking, not elsewhere classified: Secondary | ICD-10-CM

## 2023-10-26 DIAGNOSIS — M25561 Pain in right knee: Secondary | ICD-10-CM | POA: Diagnosis not present

## 2023-10-26 DIAGNOSIS — G8929 Other chronic pain: Secondary | ICD-10-CM

## 2023-10-26 DIAGNOSIS — M6281 Muscle weakness (generalized): Secondary | ICD-10-CM | POA: Diagnosis not present

## 2023-10-26 DIAGNOSIS — M24661 Ankylosis, right knee: Secondary | ICD-10-CM | POA: Diagnosis not present

## 2023-10-28 DIAGNOSIS — E669 Obesity, unspecified: Secondary | ICD-10-CM | POA: Diagnosis not present

## 2023-10-28 DIAGNOSIS — K269 Duodenal ulcer, unspecified as acute or chronic, without hemorrhage or perforation: Secondary | ICD-10-CM | POA: Diagnosis not present

## 2023-10-28 DIAGNOSIS — D509 Iron deficiency anemia, unspecified: Secondary | ICD-10-CM | POA: Diagnosis not present

## 2023-10-28 DIAGNOSIS — K5730 Diverticulosis of large intestine without perforation or abscess without bleeding: Secondary | ICD-10-CM | POA: Diagnosis not present

## 2023-11-01 NOTE — Therapy (Signed)
 OUTPATIENT PHYSICAL THERAPY LOWER EXTREMITY TREATMENT   Patient Name: Hannah [name] MRN: [id number] DOB:1946/06/20, 78 y.o., female Today's Date:    END OF SESSION:  PT End of Session - 11/02/23 1637     Visit Number 23    Number of Visits 33    Date for PT Re-Evaluation 12/10/23    Authorization Type [employer]    Authorization Time Period Approved 12 visits 10/25/23-12/11/23    Authorization - Visit Number 2    Authorization - Number of Visits 12    Progress Note Due on Visit 30    PT Start Time 1630    PT Stop Time 1715    PT Time Calculation (min) 45 min    Activity Tolerance Patient tolerated treatment well    Behavior During Therapy WFL for tasks assessed/performed                  Past Medical History:  Diagnosis Date   Allergy  25 years ago.   Anemia    Arthritis    Asthma    Atrial fibrillation (HCC)    Cataract Had cataracts surgery 2015 & 2016   Chronic kidney disease    Diabetes mellitus without complication (HCC)    Gout 12/17/2014   patient reported   Heart murmur 4 years ago   Hyperlipidemia    Hypertension    Hypothyroidism    Systemic lupus erythematosus (HCC)    Vitiligo    Past Surgical History:  Procedure Laterality Date   BIOPSY  09/26/2023   Procedure: BIOPSY;  Surgeon: Aneita Gwendlyn DASEN, MD;  Location: [facility];  Service: Gastroenterology;;   CATARACT EXTRACTION Bilateral 2015   COLONOSCOPY WITH PROPOFOL  N/A 09/27/2023   Procedure: COLONOSCOPY WITH PROPOFOL ;  Surgeon: Kristie Lamprey, MD;  Location: [facility];  Service: Gastroenterology;  Laterality: N/A;   DILATION AND CURETTAGE OF UTERUS     DOPPLER ECHOCARDIOGRAPHY  05/2018   Internist to review with pt; potential heart murmur 06/20/18   ESOPHAGOGASTRODUODENOSCOPY (EGD) WITH PROPOFOL  N/A 09/26/2023   Procedure: ESOPHAGOGASTRODUODENOSCOPY (EGD) WITH PROPOFOL ;  Surgeon: Aneita Gwendlyn DASEN, MD;  Location: [facility];  Service: Gastroenterology;  Laterality: N/A;    EYE SURGERY  Cataract Surgery, 2015 & 2016.   keratosis removal  2021   KNEE CLOSED REDUCTION Left 07/06/2022   Procedure: CLOSED MANIPULATION KNEE;  Surgeon: Melodi Lerner, MD;  Location: [facility];  Service: Orthopedics;  Laterality: Left;   KNEE CLOSED REDUCTION Right 09/13/2023   Procedure: CLOSED MANIPULATION KNEE;  Surgeon: Melodi Lerner, MD;  Location: [facility];  Service: Orthopedics;  Laterality: Right;   REPLACEMENT TOTAL KNEE Left    SKIN SURGERY  11/30/2018   left side of face   TOOTH EXTRACTION     TOTAL KNEE ARTHROPLASTY Left 02/02/2022   Procedure: TOTAL KNEE ARTHROPLASTY;  Surgeon: Melodi Lerner, MD;  Location: [facility];  Service: Orthopedics;  Laterality: Left;   TOTAL KNEE ARTHROPLASTY Right 07/12/2023   Procedure: RIGHT TOTAL KNEE ARTHROPLASTY;  Surgeon: Melodi Lerner, MD;  Location: [facility];  Service: Orthopedics;  Laterality: Right;   Patient Active Problem List   Diagnosis Date Noted   Class 1 obesity due to excess calories with serious comorbidity and body mass index (BMI) of 32.0 to 32.9 in adult 10/17/2023   Chronic superficial gastritis with bleeding 10/17/2023   Occult blood detected in feces by immunoassay 09/26/2023   ABLA (acute blood loss anemia) 09/23/2023   GI bleed 09/23/2023   Pure hypercholesterolemia 07/18/2023  Personal history of COVID-19 07/18/2023   Primary osteoarthritis of right knee 07/12/2023   Acquired thrombophilia (HCC) 01/12/2023   Arthrofibrosis of knee joint, right 07/06/2022   OA (osteoarthritis) of knee 02/02/2022   Primary osteoarthritis of left knee 02/02/2022   Chronic bilateral low back pain without sciatica 06/25/2021   Paresthesia and pain of extremity 06/25/2021   Atrial fibrillation (HCC) 04/16/2021   Demand ischemia (HCC) 03/26/2021   Unspecified atrial fibrillation (HCC) 03/26/2021   COVID-19 virus infection 03/26/2021   PVC (premature ventricular contraction) 05/06/2020   Type 2 diabetes mellitus with stage 3 chronic  kidney disease, without long-term current use of insulin  (HCC) 03/20/2020   Palpitations 03/20/2020   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   Primary hypothyroidism 03/20/2020   Vitiligo 03/20/2020   Class 2 severe obesity due to excess calories with serious comorbidity and body mass index (BMI) of 35.0 to 35.9 in adult (HCC) 10/31/2019   Iron  deficiency anemia 08/12/2017   Osteoporosis 10/02/2016   Systemic lupus erythematosus (HCC) 10/02/2016   Rheumatoid arthritis involving multiple sites with positive rheumatoid factor (HCC) 10/02/2016   High risk medication use 10/02/2016   History of chronic kidney disease 10/02/2016   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/2016   Primary osteoarthritis of both knees 10/02/2016   Vitamin D  deficiency 10/02/2016   History of diabetes mellitus 10/02/2016   History of hypertension 10/02/2016   History of asthma 10/02/2016   Absolute anemia    MGUS (monoclonal gammopathy of unknown significance)    Normocytic anemia 03/21/2015   Essential hypertension 03/20/2015   Abnormal CT of the chest 07/30/2012   Asthma 06/19/2012    PCP: Jarold Medici, MD  REFERRING PROVIDER: Melodi Lerner, MD   REFERRING DIAG: M17.11 (ICD-10-CM) - Unilateral primary osteoarthritis, right knee   THERAPY DIAG:  Chronic pain of right knee  Muscle weakness (generalized)  Difficulty in walking, not elsewhere classified  Rationale for Evaluation and Treatment: Rehabilitation  ONSET DATE: 07/12/23  SUBJECTIVE:   SUBJECTIVE STATEMENT: 11/02/23: Pt reports she has been wearing the knee extension brace 3x a day for about 20 mins each session.  EVAL: Pt reports she has been completing her HEP 1x daily and using cold packs on a frequent basis for pain and swelling management.  PAIN:  Are you having pain? Yes: NPRS scale: 5/10 intermittent; pain range 5-10/10 Pain location: R knee Pain description: ache ,throb ,sharp Aggravating  factors: movement, lifting, wt bearing Relieving factors: Pain medication, muscle relaxer, cold packs  PERTINENT HISTORY: High BMI, L LKA, Afib  PRECAUTIONS: Knee  RED FLAGS: None   WEIGHT BEARING RESTRICTIONS: No  FALLS:  Has patient fallen in last 6 months? No  LIVING ENVIRONMENT: Lives with: lives alone Lives in: House/apartment Stairs: No Has following equipment at home: Single point cane, Environmental Consultant - 2 wheeled, Wheelchair (manual), and bed side commode  OCCUPATION: Retired  PLOF: Independent with household mobility with device  PATIENT GOALS: good use of my R knee  NEXT MD VISIT: 07/27/23  OBJECTIVE:  Note: Objective measures were completed at Evaluation unless otherwise noted.  DIAGNOSTIC FINDINGS: None for the R knee in Epic  PATIENT SURVEYS:  FOTO: Perceived function   22%, predicted   43%   COGNITION: Overall cognitive status: Within functional limits for tasks assessed     SENSATION: WFL  EDEMA:  Significant R knee edema present   MUSCLE LENGTH: Hamstrings: Right NT deg; Left NT deg Debby test: Right NT deg; Left NT  deg  POSTURE: rounded shoulders, forward head, and anterior pelvic tilt  PALPATION: TTP to the R peri-knee  LOWER EXTREMITY ROM:  Active ROM Right eval Left eval Rt 07/20/23 Rt 07/22/23 RT 07/27/23 Rt 07/29/23 08/03/23 Rt 08/06/23  Hip flexion          Hip extension          Hip abduction          Hip adduction          Hip internal rotation          Hip external rotation          Knee flexion 50  70 AA 80 AA75 AA85 AA 72 AA85  Knee extension 30  30 30 23  25d  30  Ankle dorsiflexion          Ankle plantarflexion          Ankle inversion          Ankle eversion           (Blank rows = not tested) 08/11/23:30-85d. 08/19/23=AAROM 30-90d. 08/31/23=AA90d   09/02/23=AA 30-85d. 09/08/23=AA30-90. 09/14/23=AA34-80: 10/01/23: 28-90 AA (end of session seated). 10/05/23: 28-90 AA (end of session supine). 10/12/23: 33d AA (end of session  supine). 10/12/23: 33d AA (end of session supine). 10/15/23: 28d AA (end of session supine); 10/19/23:86-28d AA (end of session supine). 10/22/23:86-28d AA (end of session supine). 10/26/23: ext= 28AA. 11/02/23: pre PT 33d; post PT 27dAA   LOWER EXTREMITY MMT:  MMT Right eval Left eval RT 07/27/23 Rt 09/02/23  Hip flexion 2  3 with limited ROM 4+  Hip extension 2   4+  Hip abduction 2   4+  Hip adduction      Hip internal rotation      Hip external rotation 2   4+  Knee flexion 2   4+ with limited ROM  Knee extension 2  3 with limited ROM 4+ with limited ROM  Ankle dorsiflexion      Ankle plantarflexion      Ankle inversion      Ankle eversion       (Blank rows = not tested)  LOWER EXTREMITY SPECIAL TESTS:  NT  FUNCTIONAL TESTS:  5 times sit to stand: TBA 2 minute walk test: TBA  5xSTS=24.7 c arm rest  2MWT=78' c RW GAIT: Distance walked: 67ft Assistive device utilized: Environmental Consultant - 2 wheeled Level of assistance: SBA Comments: Antalgic over the R LE  TRANSFER: Mod A for mat table transfer c A for the R LE  TODAY'S TREATMENT:  [facility] Adult PT Treatment:                                                DATE: 11/02/23 Therapeutic Exercise: Nustep 5 mins L5 UE/LE LAQ 5# 10 x 3  Heel slides c glide disc Manual Therapy: A/P and P/A fem/tib grade 4 mobs  Contact/relax stretching for extension LAD R LE Grade 4 patella mobs all directions with emphasis on sup  [facility] Adult PT Treatment:                                                DATE: 10/26/23 Therapeutic Exercise: Nustep 5 mins L5 UE/LE  LAQ 5# 10 x 3  Standing hamstring curl 5# 10 x 3 Standing heel raise/toe lift 10 x 3 Standing knee lifts 10 x 3 Standing hip abd 3x10 R 5# STS  2x10 c hand assist Manual Therapy: A/P and P/A fem/tib grade 4 mobs  PROM for flexion and extension  [facility] Adult PT Treatment:                                                DATE: 10/22/23 Therapeutic Exercise: Nustep 5 mins L5 UE/LE LAQ 5# 10 x 3   Standing hamstring curl 5# 10 x 3 Standing heel raise/toe lift 10 x 3 Standing knee lifts 10 x 3 Standing hip abd 3x10 R 5# STS  2x10 c hand assist Manual Therapy: A/P and P/A fem/tib grade 4 mobs  PROM for flexion and extension Therapeutic Activity: FOTO reassessed and reviewed Prolonged walking c RW, 327ft, limited by medial R knee pain   PATIENT EDUCATION:  Education details: Eval findings, POC, HEP, self care  Person educated: Patient Education method: Explanation, Demonstration, Tactile cues, Verbal cues, and Handouts Education comprehension: verbalized understanding, returned demonstration, verbal cues required, and tactile cues required  HOME EXERCISE PROGRAM: Access Code: [email or web address] URL: [email or web address] Date: 08/19/2023 Prepared by: Dasie Daft  Exercises - Long Sitting Quad Set (Mirrored)  - 3 x daily - 7 x weekly - 1 sets - 10 reps - 5 hold - Supine Heel Slide with Strap  - 3 x daily - 7 x weekly - 1 sets - 10 reps - 10 hold - Supine Knee Extension Strengthening (Mirrored)  - 3 x daily - 7 x weekly - 1 sets - 10 reps - 3 hold - Supine Hip Abduction AROM (Mirrored)  - 3 x daily - 7 x weekly - 1 sets - 10 reps - 3 hold - Seated Knee Flexion Slide  - 3 x daily - 7 x weekly - 1 sets - 10 reps - 10 hold - Active Straight Leg Raise with Quad Set  - 3 x daily - 7 x weekly - 1 sets - 10 reps - Seated Knee Extension Stretch with Chair  - 3 x daily - 7 x weekly - 1 sets  ASSESSMENT:  CLINICAL IMPRESSION: 11/02/23: Pt returns to PT after 1 week of using the JAS knee ext brace 3x daily, 20 mins each session. Pre-PT PROM was 33d and post PT AAROM was 27d. No change in R knee ROM was observed during this PT session. PT today focused of R knee mobilizations and passive stretching with active knee movements following to minimize stiffness after each intervention. Advised pt to increase wearing time of JAS brace to 45-60 mins each 3x/day session. Pt voiced  understanding.  Eval: Patient is a 78 y.o. female who was seen today for physical therapy evaluation and treatment for M17.11 (ICD-10-CM) - Unilateral primary osteoarthritis, right knee . RIGHT TOTAL KNEE ARTHROPLASTY on 07/12/2023. Pt presents with markedly decreased R knee AROM and strength, and decreased functional mobility. Pt will benefit from skilled PT 2w8 to address impairments to optimize R LE/knee function with less pain.   OBJECTIVE IMPAIRMENTS: decreased activity tolerance, decreased mobility, difficulty walking, decreased ROM, decreased strength, increased edema, obesity, and pain.   ACTIVITY LIMITATIONS: carrying, lifting, bending, sitting, standing, squatting, sleeping, stairs, transfers, bed mobility, bathing, toileting, dressing, locomotion level, and caring for  others  PARTICIPATION LIMITATIONS: meal prep, cleaning, laundry, and driving  PERSONAL FACTORS: Age, Fitness, Past/current experiences, Time since onset of injury/illness/exacerbation, and 1 comorbidity: high BMI  are also affecting patient's functional outcome.   REHAB POTENTIAL: Good  CLINICAL DECISION MAKING: Stable/uncomplicated  EVALUATION COMPLEXITY: Low   GOALS:  SHORT TERM GOALS: Target date: 08/06/23 Pt will be Ind in an initial HEP  Baseline: started Goal status: MET  2.  Pt will be able to complete a SLR and SAQ with or without quad lag Baseline: not able to complete 07/29/23: able to complete both with limited knee ext ROM present Goal status: MET  3.  Increase R knee AROM to 15-80d for improved R knee function Baseline: 30-50 10/01/23: AAROM 28-90 Goal status: PARTIALLY MET FOR FLEXION  LONG TERM GOALS: Target date: 10/22/23  Pt will be Ind in a final HEP to maintain achieved LOF Baseline: started Goal status: ONGOING  2.  Increase R hip and knee strength to 4/5 for improved function use of the R LE Baseline: see flow sheets 09/02/23: see flow sheets Goal status: MET  3.  Increased R  knee AROM to 5-115d for appropriate fuctional mobility Baseline: 30-50d 09/02/23: 30-85d 10/22/23: 28-86d Goal status: NOT MET  4.  Improve 5xSTS by MCID of 5 and by MCID of 51ft as indication of improved functional mobility  Baseline: TBA when pt has progressed to gait c a [facility] 08/31/23: 5xSTS=24.7 c arm rest      2MWT=78' c RW 10/15/23:  5xSTS=11.8 s arm rest      2MWT= 185' c RW Goal status: MET  5.  Pt's FOTO score will improved to the predicted value of 43% as indication of improved function  Baseline: 22% 10/22/23:48% Goal status: MET  6.  Pt will be able to walk 428ft c or s a SPC and ascend/descend 12 steps c or s a SPC and c use of a HR for community mobility Baseline: 74ft c RW 10/22/23: 337ft  Goal status: ONGOING   PLAN:  PT FREQUENCY: 2x/week  PT DURATION: 6 weeks  PLANNED INTERVENTIONS: 97164- PT Re-evaluation, 97110-Therapeutic exercises, 97530- Therapeutic activity, 97535- Self Care, [id number]- Manual therapy, [id number]- Gait training, 97014- Electrical stimulation (unattended), [id number]- Electrical stimulation (manual), 97016- Vasopneumatic device, Stair training, Dry Needling, Joint mobilization, Cryotherapy, and Moist heat  PLAN FOR NEXT SESSION: Review FOTO; assess response to HEP; progress therex as indicated; use of modalities, manual therapy; and TPDN as indicated.    Luv Mish MS, PT 11/02/23 5:54 PM

## 2023-11-02 ENCOUNTER — Ambulatory Visit: Payer: Medicare HMO | Attending: Internal Medicine

## 2023-11-02 DIAGNOSIS — R262 Difficulty in walking, not elsewhere classified: Secondary | ICD-10-CM | POA: Insufficient documentation

## 2023-11-02 DIAGNOSIS — M25561 Pain in right knee: Secondary | ICD-10-CM | POA: Insufficient documentation

## 2023-11-02 DIAGNOSIS — M6281 Muscle weakness (generalized): Secondary | ICD-10-CM | POA: Insufficient documentation

## 2023-11-02 DIAGNOSIS — G8929 Other chronic pain: Secondary | ICD-10-CM | POA: Insufficient documentation

## 2023-11-03 NOTE — Therapy (Signed)
 OUTPATIENT PHYSICAL THERAPY LOWER EXTREMITY TREATMENT   Patient Name: Hannah [name] MRN: [id number] DOB:07/30/1946, 78 y.o., female Today's Date:    END OF SESSION:  PT End of Session - 11/04/23 1554     Visit Number 24    Number of Visits 33    Date for PT Re-Evaluation 12/10/23    Authorization Type [employer]    Authorization Time Period Approved 12 visits 10/25/23-12/11/23    Authorization - Visit Number 3    Authorization - Number of Visits 12    Progress Note Due on Visit 30    PT Start Time 1500    PT Stop Time 1545    PT Time Calculation (min) 45 min    Activity Tolerance Patient tolerated treatment well    Behavior During Therapy WFL for tasks assessed/performed                   Past Medical History:  Diagnosis Date   Allergy  25 years ago.   Anemia    Arthritis    Asthma    Atrial fibrillation (HCC)    Cataract Had cataracts surgery 2015 & 2016   Chronic kidney disease    Diabetes mellitus without complication (HCC)    Gout 12/17/2014   patient reported   Heart murmur 4 years ago   Hyperlipidemia    Hypertension    Hypothyroidism    Systemic lupus erythematosus (HCC)    Vitiligo    Past Surgical History:  Procedure Laterality Date   BIOPSY  09/26/2023   Procedure: BIOPSY;  Surgeon: Aneita Gwendlyn DASEN, MD;  Location: [facility];  Service: Gastroenterology;;   CATARACT EXTRACTION Bilateral 2015   COLONOSCOPY WITH PROPOFOL  N/A 09/27/2023   Procedure: COLONOSCOPY WITH PROPOFOL ;  Surgeon: Kristie Lamprey, MD;  Location: [facility];  Service: Gastroenterology;  Laterality: N/A;   DILATION AND CURETTAGE OF UTERUS     DOPPLER ECHOCARDIOGRAPHY  05/2018   Internist to review with pt; potential heart murmur 06/20/18   ESOPHAGOGASTRODUODENOSCOPY (EGD) WITH PROPOFOL  N/A 09/26/2023   Procedure: ESOPHAGOGASTRODUODENOSCOPY (EGD) WITH PROPOFOL ;  Surgeon: Aneita Gwendlyn DASEN, MD;  Location: [facility];  Service: Gastroenterology;  Laterality:  N/A;   EYE SURGERY  Cataract Surgery, 2015 & 2016.   keratosis removal  2021   KNEE CLOSED REDUCTION Left 07/06/2022   Procedure: CLOSED MANIPULATION KNEE;  Surgeon: Melodi Lerner, MD;  Location: [facility];  Service: Orthopedics;  Laterality: Left;   KNEE CLOSED REDUCTION Right 09/13/2023   Procedure: CLOSED MANIPULATION KNEE;  Surgeon: Melodi Lerner, MD;  Location: [facility];  Service: Orthopedics;  Laterality: Right;   REPLACEMENT TOTAL KNEE Left    SKIN SURGERY  11/30/2018   left side of face   TOOTH EXTRACTION     TOTAL KNEE ARTHROPLASTY Left 02/02/2022   Procedure: TOTAL KNEE ARTHROPLASTY;  Surgeon: Melodi Lerner, MD;  Location: [facility];  Service: Orthopedics;  Laterality: Left;   TOTAL KNEE ARTHROPLASTY Right 07/12/2023   Procedure: RIGHT TOTAL KNEE ARTHROPLASTY;  Surgeon: Melodi Lerner, MD;  Location: [facility];  Service: Orthopedics;  Laterality: Right;   Patient Active Problem List   Diagnosis Date Noted   Class 1 obesity due to excess calories with serious comorbidity and body mass index (BMI) of 32.0 to 32.9 in adult 10/17/2023   Chronic superficial gastritis with bleeding 10/17/2023   Occult blood detected in feces by immunoassay 09/26/2023   ABLA (acute blood loss anemia) 09/23/2023   GI bleed 09/23/2023   Pure hypercholesterolemia 07/18/2023  Personal history of COVID-19 07/18/2023   Primary osteoarthritis of right knee 07/12/2023   Acquired thrombophilia (HCC) 01/12/2023   Arthrofibrosis of knee joint, right 07/06/2022   OA (osteoarthritis) of knee 02/02/2022   Primary osteoarthritis of left knee 02/02/2022   Chronic bilateral low back pain without sciatica 06/25/2021   Paresthesia and pain of extremity 06/25/2021   Atrial fibrillation (HCC) 04/16/2021   Demand ischemia (HCC) 03/26/2021   Unspecified atrial fibrillation (HCC) 03/26/2021   COVID-19 virus infection 03/26/2021   PVC (premature ventricular contraction) 05/06/2020   Type 2 diabetes mellitus with stage 3  chronic kidney disease, without long-term current use of insulin  (HCC) 03/20/2020   Palpitations 03/20/2020   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   Primary hypothyroidism 03/20/2020   Vitiligo 03/20/2020   Class 2 severe obesity due to excess calories with serious comorbidity and body mass index (BMI) of 35.0 to 35.9 in adult (HCC) 10/31/2019   Iron  deficiency anemia 08/12/2017   Osteoporosis 10/02/2016   Systemic lupus erythematosus (HCC) 10/02/2016   Rheumatoid arthritis involving multiple sites with positive rheumatoid factor (HCC) 10/02/2016   High risk medication use 10/02/2016   History of chronic kidney disease 10/02/2016   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/2016   Primary osteoarthritis of both knees 10/02/2016   Vitamin D  deficiency 10/02/2016   History of diabetes mellitus 10/02/2016   History of hypertension 10/02/2016   History of asthma 10/02/2016   Absolute anemia    MGUS (monoclonal gammopathy of unknown significance)    Normocytic anemia 03/21/2015   Essential hypertension 03/20/2015   Abnormal CT of the chest 07/30/2012   Asthma 06/19/2012    PCP: Jarold Medici, MD  REFERRING PROVIDER: Melodi Lerner, MD   REFERRING DIAG: M17.11 (ICD-10-CM) - Unilateral primary osteoarthritis, right knee   THERAPY DIAG:  Chronic pain of right knee  Muscle weakness (generalized)  Difficulty in walking, not elsewhere classified  Rationale for Evaluation and Treatment: Rehabilitation  ONSET DATE: 07/12/23  SUBJECTIVE:   SUBJECTIVE STATEMENT: 11/04/23: Pt reports she has increased the wearing time of the knee extension brace to 45 mins per session  EVAL: Pt reports she has been completing her HEP 1x daily and using cold packs on a frequent basis for pain and swelling management.  PAIN:  Are you having pain? Yes: NPRS scale: 5/10 intermittent; pain range 5-10/10 Pain location: R knee Pain description: ache ,throb  ,sharp Aggravating factors: movement, lifting, wt bearing Relieving factors: Pain medication, muscle relaxer, cold packs  PERTINENT HISTORY: High BMI, L LKA, Afib  PRECAUTIONS: Knee  RED FLAGS: None   WEIGHT BEARING RESTRICTIONS: No  FALLS:  Has patient fallen in last 6 months? No  LIVING ENVIRONMENT: Lives with: lives alone Lives in: House/apartment Stairs: No Has following equipment at home: Single point cane, Environmental Consultant - 2 wheeled, Wheelchair (manual), and bed side commode  OCCUPATION: Retired  PLOF: Independent with household mobility with device  PATIENT GOALS: good use of my R knee  NEXT MD VISIT: 07/27/23  OBJECTIVE:  Note: Objective measures were completed at Evaluation unless otherwise noted.  DIAGNOSTIC FINDINGS: None for the R knee in Epic  PATIENT SURVEYS:  FOTO: Perceived function   22%, predicted   43%   COGNITION: Overall cognitive status: Within functional limits for tasks assessed     SENSATION: WFL  EDEMA:  Significant R knee edema present   MUSCLE LENGTH: Hamstrings: Right NT deg; Left NT deg Debby test: Right NT deg; Left NT deg  POSTURE: rounded shoulders, forward head, and anterior pelvic tilt  PALPATION: TTP to the R peri-knee  LOWER EXTREMITY ROM:  Active ROM Right eval Left eval Rt 07/20/23 Rt 07/22/23 RT 07/27/23 Rt 07/29/23 08/03/23 Rt 08/06/23  Hip flexion          Hip extension          Hip abduction          Hip adduction          Hip internal rotation          Hip external rotation          Knee flexion 50  70 AA 80 AA75 AA85 AA 72 AA85  Knee extension 30  30 30 23  25d  30  Ankle dorsiflexion          Ankle plantarflexion          Ankle inversion          Ankle eversion           (Blank rows = not tested) 08/11/23:30-85d. 08/19/23=AAROM 30-90d. 08/31/23=AA90d   09/02/23=AA 30-85d. 09/08/23=AA30-90. 09/14/23=AA34-80: 10/01/23: 28-90 AA (end of session seated). 10/05/23: 28-90 AA (end of session supine). 10/12/23: 33d  AA (end of session supine). 10/12/23: 33d AA (end of session supine). 10/15/23: 28d AA (end of session supine); 10/19/23:86-28d AA (end of session supine). 10/22/23:86-28d AA (end of session supine). 10/26/23: ext= 28AA. 11/02/23: pre PT 33d; post PT 27dAA; post PT ext ROM= -25d.   LOWER EXTREMITY MMT:  MMT Right eval Left eval RT 07/27/23 Rt 09/02/23  Hip flexion 2  3 with limited ROM 4+  Hip extension 2   4+  Hip abduction 2   4+  Hip adduction      Hip internal rotation      Hip external rotation 2   4+  Knee flexion 2   4+ with limited ROM  Knee extension 2  3 with limited ROM 4+ with limited ROM  Ankle dorsiflexion      Ankle plantarflexion      Ankle inversion      Ankle eversion       (Blank rows = not tested)  LOWER EXTREMITY SPECIAL TESTS:  NT  FUNCTIONAL TESTS:  5 times sit to stand: TBA 2 minute walk test: TBA  5xSTS=24.7 c arm rest  2MWT=78' c RW GAIT: Distance walked: 68ft Assistive device utilized: Environmental Consultant - 2 wheeled Level of assistance: SBA Comments: Antalgic over the R LE  TRANSFER: Mod A for mat table transfer c A for the R LE  TODAY'S TREATMENT:  [facility] Adult PT Treatment:                                                DATE: 11/04/23 Manual Therapy: A/P and P/A fem/tib grade 4 mobs  Contact/relax stretching for knee extension in prone LAD R LE Grade 4 patella mobs all directions with emphasis on sup Therapeutic Activity: Nustep 8 mins L5 UE/LE  [facility] Adult PT Treatment:                                                DATE: 11/02/23 Therapeutic Exercise: Nustep 5 mins L5 UE/LE LAQ 5# 10 x 3  Heel slides c glide disc Manual Therapy: A/P and P/A fem/tib grade 4 mobs  Contact/relax stretching for extension LAD R LE Grade 4 patella mobs all directions with emphasis on sup  [facility] Adult PT Treatment:                                                DATE: 10/26/23 Therapeutic Exercise: Nustep 5 mins L5 UE/LE LAQ 5# 10 x 3  Standing hamstring curl 5# 10 x  3 Standing heel raise/toe lift 10 x 3 Standing knee lifts 10 x 3 Standing hip abd 3x10 R 5# STS  2x10 c hand assist Manual Therapy: A/P and P/A fem/tib grade 4 mobs  PROM for flexion and extension  PATIENT EDUCATION:  Education details: Eval findings, POC, HEP, self care  Person educated: Patient Education method: Explanation, Demonstration, Tactile cues, Verbal cues, and Handouts Education comprehension: verbalized understanding, returned demonstration, verbal cues required, and tactile cues required  HOME EXERCISE PROGRAM: Access Code: [email or web address] URL: [email or web address] Date: 08/19/2023 Prepared by: Dasie Daft  Exercises - Long Sitting Quad Set (Mirrored)  - 3 x daily - 7 x weekly - 1 sets - 10 reps - 5 hold - Supine Heel Slide with Strap  - 3 x daily - 7 x weekly - 1 sets - 10 reps - 10 hold - Supine Knee Extension Strengthening (Mirrored)  - 3 x daily - 7 x weekly - 1 sets - 10 reps - 3 hold - Supine Hip Abduction AROM (Mirrored)  - 3 x daily - 7 x weekly - 1 sets - 10 reps - 3 hold - Seated Knee Flexion Slide  - 3 x daily - 7 x weekly - 1 sets - 10 reps - 10 hold - Active Straight Leg Raise with Quad Set  - 3 x daily - 7 x weekly - 1 sets - 10 reps - Seated Knee Extension Stretch with Chair  - 3 x daily - 7 x weekly - 1 sets  ASSESSMENT:  CLINICAL IMPRESSION: 11/04/23: Pt has increased the wearing time of the JAS knee ext to 45 mins per session. Pt session was primarily completed for manual therapy to increase L knee ext ROM with most of that time devoted to contract/relax stretching for knee ext in prone. Today the R knee measured a minimal gain in knee ext ROM to -25d. Pt tolerated PT today without adverse effects. Pt will benefit from skilled PT 2w8 to address impairments to optimize R LE/knee function with less pain.   OBJECTIVE IMPAIRMENTS: decreased activity tolerance, decreased mobility, difficulty walking, decreased ROM, decreased strength, increased  edema, obesity, and pain.   ACTIVITY LIMITATIONS: carrying, lifting, bending, sitting, standing, squatting, sleeping, stairs, transfers, bed mobility, bathing, toileting, dressing, locomotion level, and caring for others  PARTICIPATION LIMITATIONS: meal prep, cleaning, laundry, and driving  PERSONAL FACTORS: Age, Fitness, Past/current experiences, Time since onset of injury/illness/exacerbation, and 1 comorbidity: high BMI  are also affecting patient's functional outcome.   REHAB POTENTIAL: Good  CLINICAL DECISION MAKING: Stable/uncomplicated  EVALUATION COMPLEXITY: Low   GOALS:  SHORT TERM GOALS: Target date: 08/06/23 Pt will be Ind in an initial HEP  Baseline: started Goal status: MET  2.  Pt will be able to complete a SLR and SAQ with or without quad lag Baseline: not able to complete 07/29/23: able to complete both with limited knee  ext ROM present Goal status: MET  3.  Increase R knee AROM to 15-80d for improved R knee function Baseline: 30-50 10/01/23: AAROM 28-90 Goal status: PARTIALLY MET FOR FLEXION  LONG TERM GOALS: Target date: 10/22/23  Pt will be Ind in a final HEP to maintain achieved LOF Baseline: started Goal status: ONGOING  2.  Increase R hip and knee strength to 4/5 for improved function use of the R LE Baseline: see flow sheets 09/02/23: see flow sheets Goal status: MET  3.  Increased R knee AROM to 5-115d for appropriate fuctional mobility Baseline: 30-50d 09/02/23: 30-85d 10/22/23: 28-86d 11/04/23:  25d lacking Goal status: NOT MET  4.  Improve 5xSTS by MCID of 5 and by MCID of 68ft as indication of improved functional mobility  Baseline: TBA when pt has progressed to gait c a [facility] 08/31/23: 5xSTS=24.7 c arm rest      2MWT=78' c RW 10/15/23:  5xSTS=11.8 s arm rest      2MWT= 185' c RW Goal status: MET  5.  Pt's FOTO score will improved to the predicted value of 43% as indication of improved function  Baseline: 22% 10/22/23:48% Goal status:  MET  6.  Pt will be able to walk 466ft c or s a SPC and ascend/descend 12 steps c or s a SPC and c use of a HR for community mobility Baseline: 63ft c RW 10/22/23: 367ft  Goal status: ONGOING   PLAN:  PT FREQUENCY: 2x/week  PT DURATION: 6 weeks  PLANNED INTERVENTIONS: 97164- PT Re-evaluation, 97110-Therapeutic exercises, 97530- Therapeutic activity, 97535- Self Care, [id number]- Manual therapy, [id number]- Gait training, 97014- Electrical stimulation (unattended), [id number]- Electrical stimulation (manual), 97016- Vasopneumatic device, Stair training, Dry Needling, Joint mobilization, Cryotherapy, and Moist heat  PLAN FOR NEXT SESSION: Review FOTO; assess response to HEP; progress therex as indicated; use of modalities, manual therapy; and TPDN as indicated.    Amrit Cress MS, PT 11/04/23 4:10 PM

## 2023-11-04 ENCOUNTER — Ambulatory Visit: Payer: Medicare HMO

## 2023-11-04 DIAGNOSIS — M6281 Muscle weakness (generalized): Secondary | ICD-10-CM | POA: Diagnosis not present

## 2023-11-04 DIAGNOSIS — M25561 Pain in right knee: Secondary | ICD-10-CM | POA: Diagnosis not present

## 2023-11-04 DIAGNOSIS — G8929 Other chronic pain: Secondary | ICD-10-CM | POA: Diagnosis not present

## 2023-11-04 DIAGNOSIS — R262 Difficulty in walking, not elsewhere classified: Secondary | ICD-10-CM

## 2023-11-08 ENCOUNTER — Encounter: Payer: Self-pay | Admitting: Internal Medicine

## 2023-11-08 ENCOUNTER — Ambulatory Visit (INDEPENDENT_AMBULATORY_CARE_PROVIDER_SITE_OTHER): Payer: Medicare HMO | Admitting: Internal Medicine

## 2023-11-08 VITALS — BP 130/70 | HR 73 | Temp 98.1°F | Ht <= 58 in | Wt 154.0 lb

## 2023-11-08 DIAGNOSIS — I480 Paroxysmal atrial fibrillation: Secondary | ICD-10-CM

## 2023-11-08 DIAGNOSIS — N1831 Chronic kidney disease, stage 3a: Secondary | ICD-10-CM | POA: Diagnosis not present

## 2023-11-08 DIAGNOSIS — M0579 Rheumatoid arthritis with rheumatoid factor of multiple sites without organ or systems involvement: Secondary | ICD-10-CM

## 2023-11-08 DIAGNOSIS — E66811 Obesity, class 1: Secondary | ICD-10-CM | POA: Diagnosis not present

## 2023-11-08 DIAGNOSIS — D6869 Other thrombophilia: Secondary | ICD-10-CM | POA: Diagnosis not present

## 2023-11-08 DIAGNOSIS — I1310 Hypertensive heart and chronic kidney disease without heart failure, with stage 1 through stage 4 chronic kidney disease, or unspecified chronic kidney disease: Secondary | ICD-10-CM | POA: Diagnosis not present

## 2023-11-08 DIAGNOSIS — Z6832 Body mass index (BMI) 32.0-32.9, adult: Secondary | ICD-10-CM

## 2023-11-08 DIAGNOSIS — E1122 Type 2 diabetes mellitus with diabetic chronic kidney disease: Secondary | ICD-10-CM

## 2023-11-08 DIAGNOSIS — E6609 Other obesity due to excess calories: Secondary | ICD-10-CM | POA: Diagnosis not present

## 2023-11-08 NOTE — Progress Notes (Signed)
I,Hannah [name], CMA,acting as a Neurosurgeon for Hannah Aliment, MD.,have documented all relevant documentation on the behalf of Hannah Aliment, MD,as directed by  Hannah Aliment, MD while in the presence of Hannah Aliment, MD.  Subjective:  Patient ID: Hannah [name] , female    DOB: 1946/02/06 , 78 y.o.   MRN: [id number]  Chief Complaint  Patient presents with   Hypertension   Diabetes    HPI  She presents for diabetes and blood pressure check. She reports compliance with meds. She denies headaches, chest pain and shortness of breath.   She states she has to complete stool test for Dr. Loreta Ave. She was advised that her hemoglobin is dropping.     Diabetes She presents for her follow-up diabetic visit. She has type 2 diabetes mellitus. There are no hypoglycemic associated symptoms. Pertinent negatives for hypoglycemia include no headaches. Pertinent negatives for diabetes include no blurred vision and no chest pain. There are no hypoglycemic complications. Diabetic complications include nephropathy. Risk factors for coronary artery disease include diabetes mellitus, dyslipidemia, hypertension, post-menopausal and sedentary lifestyle. She is following a diabetic diet. She participates in exercise three times a week. An ACE inhibitor/angiotensin II receptor blocker is being taken.  Hypertension This is a chronic problem. The current episode started more than 1 year ago. The problem has been gradually improving since onset. The problem is controlled. Pertinent negatives include no blurred vision, chest pain or headaches. Agents associated with hypertension include thyroid hormones. Risk factors for coronary artery disease include dyslipidemia, diabetes mellitus and obesity. Past treatments include calcium channel blockers, beta blockers and angiotensin blockers. Hypertensive end-organ damage includes kidney disease.     Past Medical History:  Diagnosis Date   Allergy 25 years ago.    Anemia    Arthritis    Asthma    Atrial fibrillation (HCC)    Cataract Had cataracts surgery 2015 & 2016   Chronic kidney disease    Diabetes mellitus without complication (HCC)    Gout 12/17/2014   patient reported   Heart murmur 4 years ago   Hyperlipidemia    Hypertension    Hypothyroidism    Systemic lupus erythematosus (HCC)    Vitiligo      Family History  Problem Relation Age of Onset   Heart disease Father    Diabetes Father    Hypertension Mother    Obesity Mother    Varicose Veins Mother    Hypertension Sister    Hypertension Sister    Leukemia Sister    Cancer Sister    Obesity Sister      Current Outpatient Medications:    Accu-Chek Softclix Lancets lancets, Use to check blood sugars daily E11.69, Disp: 100 each, Rfl: 2   albuterol (PROVENTIL HFA;VENTOLIN HFA) 108 (90 [name]) MCG/ACT inhaler, Inhale 2 puffs into the lungs every 6 (six) hours as needed for wheezing or shortness of breath., Disp: 1 Inhaler, Rfl: 2   Alcohol Swabs (ALCOHOL PADS) 70 % PADS, Use as directed to check blood sugars 1 time per day dx: e11.22, Disp: 150 each, Rfl: 2   allopurinol (ZYLOPRIM) 100 MG tablet, Take 1 tablet (100 mg total) by mouth 3 (three) times a week for 90 doses. (Patient taking differently: Take 100 mg by mouth every Monday, Wednesday, and Friday.), Disp: 90 tablet, Rfl: 2   amLODipine (NORVASC) 5 MG tablet, Take 1 tablet (5 mg total) by mouth at bedtime., Disp: 90 tablet, Rfl: 2   apixaban (  ELIQUIS) 5 MG TABS tablet, Take 1 tablet (5 mg total) by mouth 2 (two) times daily., Disp: 180 tablet, Rfl: 1   Ascorbic Acid (VITAMIN C PO), Take 500 mg by mouth daily with lunch., Disp: , Rfl:    atorvastatin (LIPITOR) 80 MG tablet, TAKE 1 TABLET BY MOUTH EVERY DAY, Disp: 90 tablet, Rfl: 2   B Complex-C (B-COMPLEX WITH VITAMIN C) tablet, Take 1 tablet by mouth daily with lunch., Disp: , Rfl:    budesonide-formoterol (SYMBICORT) 160-4.5 MCG/ACT inhaler, INHALE 2 PUFFS BY MOUTH TWICE  DAILY IN THE MORNING AND IN THE EVENING (Patient taking differently: Inhale 2 puffs into the lungs 2 (two) times daily as needed (Asthma).), Disp: 10.2 g, Rfl: 6   Calcium Carb-Cholecalciferol 600-800 MG-UNIT TABS, Take 1 tablet by mouth daily with lunch., Disp: , Rfl:    Cholecalciferol (VITAMIN D PO), Take 2,000 Units by mouth daily with lunch., Disp: , Rfl:    dapagliflozin propanediol (FARXIGA) 10 MG TABS tablet, Take 1 tablet (10 mg total) by mouth daily before breakfast., Disp: 90 tablet, Rfl: 3   denosumab (PROLIA) 60 MG/ML SOSY injection, Inject 60 mg into the skin every 6 (six) months. Courier to rheum: [address], [address], [place] [address]. Appt on 12/07/2022, Disp: 1 mL, Rfl: 0   glucose blood (ACCU-CHEK GUIDE) test strip, USE TO CHECK BLOOD SUGAR DAILY, Disp: 100 strip, Rfl: 2   hydroxychloroquine (PLAQUENIL) 200 MG tablet, TAKE 1 TABLET BY MOUTH IN THE MORNING AND 1/2 TABLET IN THE EVENING MONDAY-FRIDAY ONLY, Disp: 90 tablet, Rfl: 0   irbesartan (AVAPRO) 300 MG tablet, TAKE 1 TABLET(300 MG) BY MOUTH DAILY (Patient taking differently: Take 300 mg by mouth daily.), Disp: 90 tablet, Rfl: 2   metoprolol succinate (TOPROL-XL) 25 MG 24 hr tablet, TAKE 1 TABLET(25 MG) BY MOUTH DAILY (Patient taking differently: Take 25 mg by mouth daily.), Disp: 90 tablet, Rfl: 3   Multiple Vitamin (MULTIVITAMIN WITH MINERALS) TABS tablet, Take 1 tablet by mouth daily with lunch. Women's Multivitamin, Disp: , Rfl:    pantoprazole (PROTONIX) 40 MG tablet, Take 1 tablet (40 mg total) by mouth 2 (two) times daily for 30 days, THEN 1 tablet (40 mg total) daily., Disp: 120 tablet, Rfl: 0   Polyethyl Glycol-Propyl Glycol (SYSTANE) 0.4-0.3 % SOLN, Place 1-2 drops into both eyes 3 (three) times daily as needed (dry/irritated eyes.)., Disp: , Rfl:    SYNTHROID 88 MCG tablet, TAKE 1 TABLET BY MOUTH EVERY DAY MONDAY TO SATURDAY AND OFF ON SUNDAYS (Patient taking differently: Take 44-88 mcg by mouth See admin  instructions. TAKE 88 mcg BY MOUTH EVERY DAY MONDAY TO SATURDAY AND 44 mg on Sunday in the morning), Disp: 90 tablet, Rfl: 0   Allergies  Allergen Reactions   Shellfish Allergy Anaphylaxis and Hives     Review of Systems  Constitutional: Negative.   Eyes:  Negative for blurred vision.  Respiratory: Negative.    Cardiovascular: Negative.  Negative for chest pain.  Gastrointestinal: Negative.   Neurological: Negative.  Negative for headaches.  Psychiatric/Behavioral: Negative.       Today's Vitals   11/08/23 1135  BP: 130/70  Pulse: 73  Temp: 98.1 F (36.7 C)  SpO2: 98%  Weight: 154 lb (69.9 kg)  Height: 4\' 10"  (1.473 m)   Body mass index is 32.19 kg/m.  Wt Readings from Last 3 Encounters:  11/08/23 154 lb (69.9 kg)  10/05/23 155 lb 6.4 oz (70.5 kg)  09/24/23 153 lb (69.4 kg)  Objective:  Physical Exam Vitals and nursing note reviewed.  Constitutional:      Appearance: Normal appearance.  HENT:     Head: Normocephalic and atraumatic.  Eyes:     Extraocular Movements: Extraocular movements intact.  Cardiovascular:     Rate and Rhythm: Normal rate and regular rhythm.     Heart sounds: Murmur heard.  Pulmonary:     Effort: Pulmonary effort is normal.     Breath sounds: Normal breath sounds.  Musculoskeletal:     Cervical back: Normal range of motion.     Comments: Ambulatory with walker  Skin:    General: Skin is warm.  Neurological:     General: No focal deficit present.     Mental Status: She is alert.  Psychiatric:        Mood and Affect: Mood normal.        Behavior: Behavior normal.         Assessment And Plan:  Hypertensive heart and renal disease with renal failure, stage 1 through stage 4 or unspecified chronic kidney disease, without heart failure Assessment & Plan: Chronic, fair control. She is reminded to follow low sodium diet. No med changes today. She will continue with irbesartan 300mg  daily, metoprolol XL 25mg  daily an amlodipine 5mg   daily.    Paroxysmal atrial fibrillation (HCC) Assessment & Plan: Chronic, now back on Eliquis. She is currently in sinus rhythm.    Type 2 diabetes mellitus with stage 3a chronic kidney disease, without long-term current use of insulin (HCC) Assessment & Plan: Chronic, her last A1c was 4.8 in December 2024.  She will rto in April/May 2025 for her next A1c check.  She will continue with [place] 10mg  daily.   Orders: -     BMP8+EGFR  Acquired thrombophilia (HCC) Assessment & Plan: She is on Eliquis due to PAF.  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 She will continue with hydroxychloroquine 200mg  daily. Rheumatology input is appreciated. She is encouraged to follow an anti-inflammatory diet.     Class 1 obesity due to excess calories with serious comorbidity and body mass index (BMI) of 32.0 to 32.9 in adult Assessment & Plan: She is encouraged to strive for BMI less than 30 to decrease cardiac risk. Advised to aim for at least 150 minutes of exercise per week.     She is encouraged to strive for BMI less than 30 to decrease cardiac risk. Advised to aim for at least 150 minutes of exercise per week.    Return in 3 months (on 02/05/2024), or dm check.  Patient was given opportunity to ask questions. Patient verbalized understanding of the plan and was able to repeat key elements of the plan. All questions were answered to their satisfaction.    I, Hannah Aliment, MD, have reviewed all documentation for this visit. The documentation on 11/08/23 for the exam, diagnosis, procedures, and orders are all accurate and complete.   IF YOU HAVE BEEN REFERRED TO A SPECIALIST, IT MAY TAKE 1-2 WEEKS TO SCHEDULE/PROCESS THE REFERRAL. IF YOU HAVE NOT HEARD FROM US/SPECIALIST IN TWO WEEKS, PLEASE GIVE [place] A CALL AT [phone] X [phone].   THE PATIENT IS  ENCOURAGED TO PRACTICE SOCIAL DISTANCING DUE TO THE COVID-19 PANDEMIC.

## 2023-11-08 NOTE — Patient Instructions (Signed)
 Hypertension, Adult Hypertension is another name for high blood pressure. High blood pressure forces your heart to work harder to pump blood. This can cause problems over time. There are two numbers in a blood pressure reading. There is a top number (systolic) over a bottom number (diastolic). It is best to have a blood pressure that is below 120/80. What are the causes? The cause of this condition is not known. Some other conditions can lead to high blood pressure. What increases the risk? Some lifestyle factors can make you more likely to develop high blood pressure: Smoking. Not getting enough exercise or physical activity. Being overweight. Having too much fat, sugar, calories, or salt (sodium) in your diet. Drinking too much alcohol. Other risk factors include: Having any of these conditions: Heart disease. Diabetes. High cholesterol. Kidney disease. Obstructive sleep apnea. Having a family history of high blood pressure and high cholesterol. Age. The risk increases with age. Stress. What are the signs or symptoms? High blood pressure may not cause symptoms. Very high blood pressure (hypertensive crisis) may cause: Headache. Fast or uneven heartbeats (palpitations). Shortness of breath. Nosebleed. Vomiting or feeling like you may vomit (nauseous). Changes in how you see. Very bad chest pain. Feeling dizzy. Seizures. How is this treated? This condition is treated by making healthy lifestyle changes, such as: Eating healthy foods. Exercising more. Drinking less alcohol. Your doctor may prescribe medicine if lifestyle changes do not help enough and if: Your top number is above 130. Your bottom number is above 80. Your personal target blood pressure may vary. Follow these instructions at home: Eating and drinking  If told, follow the DASH eating plan. To follow this plan: Fill one half of your plate at each meal with fruits and vegetables. Fill one fourth of your plate  at each meal with whole grains. Whole grains include whole-wheat pasta, brown rice, and whole-grain bread. Eat or drink low-fat dairy products, such as skim milk or low-fat yogurt. Fill one fourth of your plate at each meal with low-fat (lean) proteins. Low-fat proteins include fish, chicken without skin, eggs, beans, and tofu. Avoid fatty meat, cured and processed meat, or chicken with skin. Avoid pre-made or processed food. Limit the amount of salt in your diet to less than 1,500 mg each day. Do not drink alcohol if: Your doctor tells you not to drink. You are pregnant, may be pregnant, or are planning to become pregnant. If you drink alcohol: Limit how much you have to: 0-1 drink a day for women. 0-2 drinks a day for men. Know how much alcohol is in your drink. In the [place] one drink equals one 12 oz bottle of beer (355 mL), one 5 oz glass of wine (148 mL), or one 1 oz glass of hard liquor (44 mL). Lifestyle  Work with your doctor to stay at a healthy weight or to lose weight. Ask your doctor what the best weight is for you. Get at least 30 minutes of exercise that causes your heart to beat faster (aerobic exercise) most days of the week. This may include walking, swimming, or biking. Get at least 30 minutes of exercise that strengthens your muscles (resistance exercise) at least 3 days a week. This may include lifting weights or doing Pilates. Do not smoke or use any products that contain nicotine or tobacco. If you need help quitting, ask your doctor. Check your blood pressure at home as told by your doctor. Keep all follow-up visits. Medicines Take over-the-counter and prescription medicines  only as told by your doctor. Follow directions carefully. Do not skip doses of blood pressure medicine. The medicine does not work as well if you skip doses. Skipping doses also puts you at risk for problems. Ask your doctor about side effects or reactions to medicines that you should watch  for. Contact a doctor if: You think you are having a reaction to the medicine you are taking. You have headaches that keep coming back. You feel dizzy. You have swelling in your ankles. You have trouble with your vision. Get help right away if: You get a very bad headache. You start to feel mixed up (confused). You feel weak or numb. You feel faint. You have very bad pain in your: Chest. Belly (abdomen). You vomit more than once. You have trouble breathing. These symptoms may be an emergency. Get help right away. Call 911. Do not wait to see if the symptoms will go away. Do not drive yourself to the hospital. Summary Hypertension is another name for high blood pressure. High blood pressure forces your heart to work harder to pump blood. For most people, a normal blood pressure is less than 120/80. Making healthy choices can help lower blood pressure. If your blood pressure does not get lower with healthy choices, you may need to take medicine. This information is not intended to replace advice given to you by your health care provider. Make sure you discuss any questions you have with your health care provider. Document Revised: 07/03/2021 Document Reviewed: 07/03/2021 Elsevier Patient Education  2024 [employer].

## 2023-11-08 NOTE — Therapy (Signed)
OUTPATIENT PHYSICAL THERAPY LOWER EXTREMITY TREATMENT   Patient Name: Hannah [name] MRN: [id number] DOB:02-22-1946, 78 y.o., female Today's Date:    END OF SESSION:  PT End of Session - 11/09/23 1508     Visit Number 25    Number of Visits 33    Date for PT Re-Evaluation 12/10/23    Authorization Type [employer]IC[employer] HMO    Authorization Time Period Approved 12 visits 10/25/23-12/11/23    Authorization - Visit Number 4    Authorization - Number of Visits 12    Progress Note Due on Visit 30    PT Start Time 1504    PT Stop Time 1545    PT Time Calculation (min) 41 min    Activity Tolerance Patient tolerated treatment well    Behavior During Therapy WFL for tasks assessed/performed                    Past Medical History:  Diagnosis Date   Allergy 25 years ago.   Anemia    Arthritis    Asthma    Atrial fibrillation (HCC)    Cataract Had cataracts surgery 2015 & 2016   Chronic kidney disease    Diabetes mellitus without complication (HCC)    Gout 12/17/2014   patient reported   Heart murmur 4 years ago   Hyperlipidemia    Hypertension    Hypothyroidism    Systemic lupus erythematosus (HCC)    Vitiligo    Past Surgical History:  Procedure Laterality Date   BIOPSY  09/26/2023   Procedure: BIOPSY;  Surgeon: Meryl Dare, MD;  Location: [facility];  Service: Gastroenterology;;   CATARACT EXTRACTION Bilateral 2015   COLONOSCOPY WITH PROPOFOL N/A 09/27/2023   Procedure: COLONOSCOPY WITH PROPOFOL;  Surgeon: [name], MD;  Location: [facility];  Service: Gastroenterology;  Laterality: N/A;   DILATION AND CURETTAGE OF UTERUS     DOPPLER ECHOCARDIOGRAPHY  05/2018   Internist to review with pt; potential heart murmur 06/20/18   ESOPHAGOGASTRODUODENOSCOPY (EGD) WITH PROPOFOL N/A 09/26/2023   Procedure: ESOPHAGOGASTRODUODENOSCOPY (EGD) WITH PROPOFOL;  Surgeon: Meryl Dare, MD;  Location: [facility];  Service: Gastroenterology;  Laterality:  N/A;   EYE SURGERY  Cataract Surgery, 2015 & 2016.   keratosis removal  2021   KNEE CLOSED REDUCTION Left 07/06/2022   Procedure: CLOSED MANIPULATION KNEE;  Surgeon: Ollen Gross, MD;  Location: [facility];  Service: Orthopedics;  Laterality: Left;   KNEE CLOSED REDUCTION Right 09/13/2023   Procedure: CLOSED MANIPULATION KNEE;  Surgeon: Ollen Gross, MD;  Location: [facility];  Service: Orthopedics;  Laterality: Right;   REPLACEMENT TOTAL KNEE Left    SKIN SURGERY  11/30/2018   left side of face   TOOTH EXTRACTION     TOTAL KNEE ARTHROPLASTY Left 02/02/2022   Procedure: TOTAL KNEE ARTHROPLASTY;  Surgeon: Ollen Gross, MD;  Location: [facility];  Service: Orthopedics;  Laterality: Left;   TOTAL KNEE ARTHROPLASTY Right 07/12/2023   Procedure: RIGHT TOTAL KNEE ARTHROPLASTY;  Surgeon: Ollen Gross, MD;  Location: [facility];  Service: Orthopedics;  Laterality: Right;   Patient Active Problem List   Diagnosis Date Noted   Class 1 obesity due to excess calories with serious comorbidity and body mass index (BMI) of 32.0 to 32.9 in adult 10/17/2023   Chronic superficial gastritis with bleeding 10/17/2023   Occult blood detected in feces by immunoassay 09/26/2023   ABLA (acute blood loss anemia) 09/23/2023   GI bleed 09/23/2023   Pure hypercholesterolemia  07/18/2023   Personal history of COVID-19 07/18/2023   Primary osteoarthritis of right knee 07/12/2023   Acquired thrombophilia (HCC) 01/12/2023   Arthrofibrosis of knee joint, right 07/06/2022   OA (osteoarthritis) of knee 02/02/2022   Primary osteoarthritis of left knee 02/02/2022   Chronic bilateral low back pain without sciatica 06/25/2021   Paresthesia and pain of extremity 06/25/2021   Atrial fibrillation (HCC) 04/16/2021   Demand ischemia (HCC) 03/26/2021   Unspecified atrial fibrillation (HCC) 03/26/2021   COVID-19 virus infection 03/26/2021   PVC (premature ventricular contraction) 05/06/2020   Type 2 diabetes mellitus with stage 3  chronic kidney disease, without long-term current use of insulin (HCC) 03/20/2020   Palpitations 03/20/2020   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   Primary hypothyroidism 03/20/2020   Vitiligo 03/20/2020   Iron deficiency anemia 08/12/2017   Osteoporosis 10/02/2016   Systemic lupus erythematosus (HCC) 10/02/2016   Rheumatoid arthritis with rheumatoid factor of multiple sites without organ or systems involvement (HCC) 10/02/2016   High risk medication use 10/02/2016   History of chronic kidney disease 10/02/2016   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/2016   Primary osteoarthritis of both knees 10/02/2016   Vitamin D deficiency 10/02/2016   History of diabetes mellitus 10/02/2016   History of hypertension 10/02/2016   History of asthma 10/02/2016   Absolute anemia    MGUS (monoclonal gammopathy of unknown significance)    Normocytic anemia 03/21/2015   Essential hypertension 03/20/2015   Abnormal CT of the chest 07/30/2012   Asthma 06/19/2012    PCP: Dorothyann Peng, MD  REFERRING PROVIDER: Ollen Gross, MD   REFERRING DIAG: M17.11 (ICD-10-CM) - Unilateral primary osteoarthritis, right knee   THERAPY DIAG:  Chronic pain of right knee  Muscle weakness (generalized)  Difficulty in walking, not elsewhere classified  Rationale for Evaluation and Treatment: Rehabilitation  ONSET DATE: 07/12/23  SUBJECTIVE:   SUBJECTIVE STATEMENT: 11/10/23: Pt reports she has been wearing the knee ext brace for longer time frames.  EVAL: Pt reports she has been completing her HEP 1x daily and using cold packs on a frequent basis for pain and swelling management.  PAIN:  Are you having pain? Yes: NPRS scale: 5/10 intermittent; pain range 5-10/10 Pain location: R knee Pain description: ache ,throb ,sharp Aggravating factors: movement, lifting, wt bearing Relieving factors: Pain medication, muscle relaxer, cold packs  PERTINENT HISTORY: High BMI, L  LKA, Afib  PRECAUTIONS: Knee  RED FLAGS: None   WEIGHT BEARING RESTRICTIONS: No  FALLS:  Has patient fallen in last 6 months? No  LIVING ENVIRONMENT: Lives with: lives alone Lives in: House/apartment Stairs: No Has following equipment at home: Single point cane, Environmental consultant - 2 wheeled, Wheelchair (manual), and bed side commode  OCCUPATION: Retired  PLOF: Independent with household mobility with device  PATIENT GOALS: good use of my R knee  NEXT MD VISIT: 07/27/23  OBJECTIVE:  Note: Objective measures were completed at Evaluation unless otherwise noted.  DIAGNOSTIC FINDINGS: None for the R knee in Epic  PATIENT SURVEYS:  FOTO: Perceived function   22%, predicted   43%   COGNITION: Overall cognitive status: Within functional limits for tasks assessed     SENSATION: WFL  EDEMA:  Significant R knee edema present   MUSCLE LENGTH: Hamstrings: Right NT deg; Left NT deg Thomas test: Right NT deg; Left NT deg  POSTURE: rounded shoulders, forward head, and anterior pelvic tilt  PALPATION: TTP to the R peri-knee  LOWER EXTREMITY ROM:  Active  ROM Right eval Left eval Rt 07/20/23 Rt 07/22/23 RT 07/27/23 Rt 07/29/23 08/03/23 Rt 08/06/23  Hip flexion          Hip extension          Hip abduction          Hip adduction          Hip internal rotation          Hip external rotation          Knee flexion 50  70 AA 80 AA75 AA85 AA 72 AA85  Knee extension 30  30 30 23  25d  30  Ankle dorsiflexion          Ankle plantarflexion          Ankle inversion          Ankle eversion           (Blank rows = not tested) 08/11/23:30-85d. 08/19/23=AAROM 30-90d. 08/31/23=AA90d   09/02/23=AA 30-85d. 09/08/23=AA30-90. 09/14/23=AA34-80: 10/01/23: 28-90 AA (end of session seated). 10/05/23: 28-90 AA (end of session supine). 10/12/23: 33d AA (end of session supine). 10/12/23: 33d AA (end of session supine). 10/15/23: 28d AA (end of session supine); 10/19/23:86-28d AA (end of session supine).  10/22/23:86-28d AA (end of session supine). 10/26/23: ext= 28AA. 11/02/23: pre PT 33d; post PT 27dAA; post PT ext ROM= -25d. 11/09/23=post PT ext AAROM= -25d.   LOWER EXTREMITY MMT:  MMT Right eval Left eval RT 07/27/23 Rt 09/02/23  Hip flexion 2  3 with limited ROM 4+  Hip extension 2   4+  Hip abduction 2   4+  Hip adduction      Hip internal rotation      Hip external rotation 2   4+  Knee flexion 2   4+ with limited ROM  Knee extension 2  3 with limited ROM 4+ with limited ROM  Ankle dorsiflexion      Ankle plantarflexion      Ankle inversion      Ankle eversion       (Blank rows = not tested)  LOWER EXTREMITY SPECIAL TESTS:  NT  FUNCTIONAL TESTS:  5 times sit to stand: TBA 2 minute walk test: TBA  5xSTS=24.7 c arm rest  2MWT=78' c RW GAIT: Distance walked: 31ft Assistive device utilized: Environmental consultant - 2 wheeled Level of assistance: SBA Comments: Antalgic over the R LE  TRANSFER: Mod A for mat table transfer c A for the R LE  TODAY'S TREATMENT:  [facility] Adult PT Treatment:                                                DATE: 11/09/23 Manual Therapy: A/P and P/A fem/tib grade 4 mobs  Contact/relax stretching for knee extension in prone LAD R LE Grade 4 patella mobs all directions with emphasis on superior Therapeutic Activity: Nustep 8 mins L5 UE/LE  [facility] Adult PT Treatment:                                                DATE: 11/04/23 Manual Therapy: A/P and P/A fem/tib grade 4 mobs  Contact/relax stretching for knee extension in prone LAD R LE Grade 4 patella mobs all directions with emphasis on  sup Therapeutic Activity: Nustep 8 mins L5 UE/LE  [facility] Adult PT Treatment:                                                DATE: 11/02/23 Therapeutic Exercise: Nustep 5 mins L5 UE/LE LAQ 5# 10 x 3  Heel slides c glide disc Manual Therapy: A/P and P/A fem/tib grade 4 mobs  Contact/relax stretching for extension LAD R LE Grade 4 patella mobs all directions with emphasis on  sup  PATIENT EDUCATION:  Education details: Eval findings, POC, HEP, self care  Person educated: Patient Education method: Explanation, Demonstration, Tactile cues, Verbal cues, and Handouts Education comprehension: verbalized understanding, returned demonstration, verbal cues required, and tactile cues required  HOME EXERCISE PROGRAM: Access Code: [email or web address] URL: [email or web address] Date: 08/19/2023 Prepared by: Joellyn Rued  Exercises - Long Sitting Quad Set (Mirrored)  - 3 x daily - 7 x weekly - 1 sets - 10 reps - 5 hold - Supine Heel Slide with Strap  - 3 x daily - 7 x weekly - 1 sets - 10 reps - 10 hold - Supine Knee Extension Strengthening (Mirrored)  - 3 x daily - 7 x weekly - 1 sets - 10 reps - 3 hold - Supine Hip Abduction AROM (Mirrored)  - 3 x daily - 7 x weekly - 1 sets - 10 reps - 3 hold - Seated Knee Flexion Slide  - 3 x daily - 7 x weekly - 1 sets - 10 reps - 10 hold - Active Straight Leg Raise with Quad Set  - 3 x daily - 7 x weekly - 1 sets - 10 reps - Seated Knee Extension Stretch with Chair  - 3 x daily - 7 x weekly - 1 sets  ASSESSMENT:  CLINICAL IMPRESSION: 11/09/23: Continued PT to increase R knee extension ROM. At end of session R knee AAROM measured -25d which is min better overall, but the same as her last PT session measurement. Pt tolerated PT today without adverse effects. Pt will benefit from skilled PT to address impairments to optimize R LE/knee function with less pain.   OBJECTIVE IMPAIRMENTS: decreased activity tolerance, decreased mobility, difficulty walking, decreased ROM, decreased strength, increased edema, obesity, and pain.   ACTIVITY LIMITATIONS: carrying, lifting, bending, sitting, standing, squatting, sleeping, stairs, transfers, bed mobility, bathing, toileting, dressing, locomotion level, and caring for others  PARTICIPATION LIMITATIONS: meal prep, cleaning, laundry, and driving  PERSONAL FACTORS: Age, Fitness, Past/current  experiences, Time since onset of injury/illness/exacerbation, and 1 comorbidity: high BMI  are also affecting patient's functional outcome.   REHAB POTENTIAL: Good  CLINICAL DECISION MAKING: Stable/uncomplicated  EVALUATION COMPLEXITY: Low   GOALS:  SHORT TERM GOALS: Target date: 08/06/23 Pt will be Ind in an initial HEP  Baseline: started Goal status: MET  2.  Pt will be able to complete a SLR and SAQ with or without quad lag Baseline: not able to complete 07/29/23: able to complete both with limited knee ext ROM present Goal status: MET  3.  Increase R knee AROM to 15-80d for improved R knee function Baseline: 30-50 10/01/23: AAROM 28-90 Goal status: PARTIALLY MET FOR FLEXION  LONG TERM GOALS: Target date: 10/22/23  Pt will be Ind in a final HEP to maintain achieved LOF Baseline: started Goal status: ONGOING  2.  Increase R hip and knee strength to 4/5  for improved function use of the R LE Baseline: see flow sheets 09/02/23: see flow sheets Goal status: MET  3.  Increased R knee AROM to 5-115d for appropriate fuctional mobility Baseline: 30-50d 09/02/23: 30-85d 10/22/23: 28-86d 11/04/23:  25d lacking Goal status: NOT MET  4.  Improve 5xSTS by MCID of 5" and by MCID of 46ft as indication of improved functional mobility  Baseline: TBA when pt has progressed to gait c a [facility] 08/31/23: 5xSTS=24.7 c arm rest      2MWT=78' c RW 10/15/23:  5xSTS=11.8 s arm rest      2MWT= 185' c RW Goal status: MET  5.  Pt's FOTO score will improved to the predicted value of 43% as indication of improved function  Baseline: 22% 10/22/23:48% Goal status: MET  6.  Pt will be able to walk 456ft c or s a SPC and ascend/descend 12 steps c or s a SPC and c use of a HR for community mobility Baseline: 12ft c RW 10/22/23: 311ft  Goal status: ONGOING   PLAN:  PT FREQUENCY: 2x/week  PT DURATION: 6 weeks  PLANNED INTERVENTIONS: 97164- PT Re-evaluation, 97110-Therapeutic exercises, 97530-  Therapeutic activity, 97535- Self Care, 16109- Manual therapy, [id number]- Gait training, 97014- Electrical stimulation (unattended), [id number]- Electrical stimulation (manual), 97016- Vasopneumatic device, Stair training, Dry Needling, Joint mobilization, Cryotherapy, and Moist heat  PLAN FOR NEXT SESSION: Review FOTO; assess response to HEP; progress therex as indicated; use of modalities, manual therapy; and TPDN as indicated.   Saba Neuman MS, PT 11/09/23 5:29 PM

## 2023-11-09 ENCOUNTER — Ambulatory Visit: Payer: Medicare HMO

## 2023-11-09 DIAGNOSIS — M6281 Muscle weakness (generalized): Secondary | ICD-10-CM | POA: Diagnosis not present

## 2023-11-09 DIAGNOSIS — G8929 Other chronic pain: Secondary | ICD-10-CM

## 2023-11-09 DIAGNOSIS — M25561 Pain in right knee: Secondary | ICD-10-CM | POA: Diagnosis not present

## 2023-11-09 DIAGNOSIS — R262 Difficulty in walking, not elsewhere classified: Secondary | ICD-10-CM

## 2023-11-09 LAB — CBC
Hematocrit: 31.7 % — ABNORMAL LOW (ref 34.0–46.6)
Hemoglobin: 10.4 g/dL — ABNORMAL LOW (ref 11.1–15.9)
MCH: 31.3 pg (ref 26.6–33.0)
MCHC: 32.8 g/dL (ref 31.5–35.7)
MCV: 96 fL (ref 79–97)
Platelets: 244 10*3/uL (ref 150–450)
RBC: 3.32 x10E6/uL — ABNORMAL LOW (ref 3.77–5.28)
RDW: 14.4 % (ref 11.7–15.4)
WBC: 6.5 10*3/uL (ref 3.4–10.8)

## 2023-11-09 LAB — BMP8+EGFR
BUN/Creatinine Ratio: 14 (ref 12–28)
BUN: 17 mg/dL (ref 8–27)
CO2: 25 mmol/L (ref 20–29)
Calcium: 9.4 mg/dL (ref 8.7–10.3)
Chloride: 105 mmol/L (ref 96–106)
Creatinine, Ser: 1.19 mg/dL — ABNORMAL HIGH (ref 0.57–1.00)
Glucose: 88 mg/dL (ref 70–99)
Potassium: 4.5 mmol/L (ref 3.5–5.2)
Sodium: 144 mmol/L (ref 134–144)
eGFR: 47 mL/min/{1.73_m2} — ABNORMAL LOW (ref >59)

## 2023-11-09 NOTE — Telephone Encounter (Signed)
Patient is aware she doesn't qualify for PAP currently. Needs to spend $445 on OOP for 2025. Informed of MPP option. Pt will reach back out once spending has been met.

## 2023-11-10 ENCOUNTER — Ambulatory Visit: Payer: Self-pay

## 2023-11-10 NOTE — Patient Instructions (Signed)
Visit Information  Thank you for taking time to visit with me today. Please don't hesitate to contact me if I can be of assistance to you.   Following are the goals we discussed today:   Goals Addressed             This Visit's Progress    COMPLETED: To have thyroid levels checked       Care Coordination Interventions: Evaluation of current treatment plan related to thyroid dysfunction and patient's adherence to plan as established by provider Determined patient completed a TSH lab recheck per PCP recommendations      To maintain current BMI       Care Coordination Interventions: Completed nutritional assessment  Educated patient about the importance of adequate caloric intake to include adequate protein  Reviewed and discussed with patient the best time of the day to check her weights, placing scales on a flat, level surface, weighing first thing in the morning after emptying her bladder with the least amount of clothes, record weights  Advised patient to discuss persistent weight loss with her PCP Body mass index is 32.19 kg/m.     Wt Readings from Last 3 Encounters:  11/08/23 154 lb (69.9 kg)  10/05/23 155 lb 6.4 oz (70.5 kg)  09/24/23 153 lb (69.4 kg)          COMPLETED: To obtain assistance with cost of meds       Care Coordination Interventions: Patient interviewed about adult health maintenance status including  barriers related to refilling and affording medications  Reviewed and discussed with patient her [place] was switched to [place] as she qualified for financial assistance with the cost of this medication  Updated patient's medication profile to reflect this medication change, PCP is aware Educated patient about the indication, dosage, frequency and potential SE related this medication  Instructed patient to keep her doctor well informed of noted SE or adverse events      To undergo right total knee surgery   On track    Care Coordination  Interventions: Evaluation of current treatment plan related to arthritis of right knee  and patient's adherence to plan as established by provider Determined patient continues to work with PT as directed, twice weekly, she is wearing a JAS brace as directed Discussed the importance of adherence to her HEP Encouraged patient to notify her doctor of new or worsening symptoms and or if she fails to make progress towards her goals  Discussed plans with patient for ongoing care coordination follow up and provided patient with direct contact information for nurse care coordinator         Our next appointment is by telephone on 12/08/23 at 11:00 AM  Please call the care guide team at [phone] if you need to cancel or reschedule your appointment.   If you are experiencing a Mental Health or Behavioral Health Crisis or need someone to talk to, please call 1-800-273-TALK (toll free, 24 hour hotline)  Patient verbalizes understanding of instructions and care plan provided today and agrees to view in MyChart. Active MyChart status and patient understanding of how to access instructions and care plan via MyChart confirmed with patient.     Delsa Sale RN BSN CCM [place]  [facility], [facility] Health Nurse Care Coordinator  Direct Dial: [phone] Website: [email or web address]

## 2023-11-10 NOTE — Patient Outreach (Signed)
Care Coordination   Follow Up Visit Note   11/10/2023 Name: Hannah [name] MRN: [id number] DOB: [birth year]  Hannah [name] is a 78 y.o. year old female who sees Dorothyann Peng, MD for primary care. I spoke with  Hannah [name] by phone today.  What matters to the patients health and wellness today?  Patient would like to maintain her current BMI. She would like to improve ROM to her right knee.    Goals Addressed             This Visit's Progress    COMPLETED: To have thyroid levels checked       Care Coordination Interventions: Evaluation of current treatment plan related to thyroid dysfunction and patient's adherence to plan as established by provider Determined patient completed a TSH lab recheck per PCP recommendations      To maintain current BMI       Care Coordination Interventions: Completed nutritional assessment  Educated patient about the importance of adequate caloric intake to include adequate protein  Reviewed and discussed with patient the best time of the day to check her weights, placing scales on a flat, level surface, weighing first thing in the morning after emptying her bladder with the least amount of clothes, record weights  Advised patient to discuss persistent weight loss with her PCP Body mass index is 32.19 kg/m.     Wt Readings from Last 3 Encounters:  11/08/23 154 lb (69.9 kg)  10/05/23 155 lb 6.4 oz (70.5 kg)  09/24/23 153 lb (69.4 kg)          COMPLETED: To obtain assistance with cost of meds       Care Coordination Interventions: Patient interviewed about adult health maintenance status including  barriers related to refilling and affording medications  Reviewed and discussed with patient her [place] was switched to [place] as she qualified for financial assistance with the cost of this medication  Updated patient's medication profile to reflect this medication change, PCP is aware Educated patient about the indication,  dosage, frequency and potential SE related this medication  Instructed patient to keep her doctor well informed of noted SE or adverse events      To undergo right total knee surgery   On track    Care Coordination Interventions: Evaluation of current treatment plan related to arthritis of right knee  and patient's adherence to plan as established by provider Determined patient continues to work with PT as directed, twice weekly, she is wearing a JAS brace as directed Discussed the importance of adherence to her HEP Encouraged patient to notify her doctor of new or worsening symptoms and or if she fails to make progress towards her goals  Discussed plans with patient for ongoing care coordination follow up and provided patient with direct contact information for nurse care coordinator     Interventions Today    Flowsheet Row Most Recent Value  Chronic Disease   Chronic disease during today's visit Atrial Fibrillation (AFib), Other  [Anemia,  s/p GI bleed,  s/p knee surgery,  weight loss]  General Interventions   General Interventions Discussed/Reviewed General Interventions Discussed, General Interventions Reviewed, Doctor Visits, Labs  Doctor Visits Discussed/Reviewed Doctor Visits Reviewed, Doctor Visits Discussed, PCP, Specialist  Exercise Interventions   Exercise Discussed/Reviewed Weight Managment, Exercise Reviewed, Exercise Discussed, Physical Activity  Physical Activity Discussed/Reviewed Physical Activity Discussed, Physical Activity Reviewed, Home Exercise Program (HEP)  Weight Management Weight maintenance  Education Interventions   Education Provided Provided Education  Provided Verbal Education On When to see the doctor, Labs, Medication, Nutrition, Exercise  Nutrition Interventions   Nutrition Discussed/Reviewed Nutrition Discussed, Nutrition Reviewed, Increasing proteins  Pharmacy Interventions   Pharmacy Dicussed/Reviewed Pharmacy Topics Discussed, Pharmacy Topics  Reviewed, Medications and their functions          SDOH assessments and interventions completed:  No     Care Coordination Interventions:  Yes, provided   Follow up plan: Follow up call scheduled for 12/08/23 @11 :00 PM    Encounter Outcome:  Patient Visit Completed

## 2023-11-12 ENCOUNTER — Other Ambulatory Visit (HOSPITAL_COMMUNITY): Payer: Self-pay

## 2023-11-13 NOTE — Assessment & Plan Note (Signed)
She is on Eliquis due to PAF.

## 2023-11-13 NOTE — Assessment & Plan Note (Signed)
Chronic, she is on DMARD therapy. She will continue with hydroxychloroquine 200mg  daily. Rheumatology input is appreciated. She is encouraged to follow an anti-inflammatory diet.

## 2023-11-13 NOTE — Assessment & Plan Note (Signed)
 She is encouraged to strive for BMI less than 30 to decrease cardiac risk. Advised to aim for at least 150 minutes of exercise per week.

## 2023-11-13 NOTE — Assessment & Plan Note (Signed)
Chronic, her last A1c was 4.8 in December 2024.  She will rto in April/May 2025 for her next A1c check.  She will continue with Farxiga 10mg  daily.

## 2023-11-13 NOTE — Assessment & Plan Note (Signed)
 Chronic, now back on Eliquis. She is currently in sinus rhythm.

## 2023-11-13 NOTE — Assessment & Plan Note (Signed)
 Chronic, fair control. She is reminded to follow low sodium diet. No med changes today. She will continue with irbesartan 300mg  daily, metoprolol XL 25mg  daily an amlodipine 5mg  daily.

## 2023-11-15 DIAGNOSIS — D631 Anemia in chronic kidney disease: Secondary | ICD-10-CM | POA: Diagnosis not present

## 2023-11-15 DIAGNOSIS — E1122 Type 2 diabetes mellitus with diabetic chronic kidney disease: Secondary | ICD-10-CM | POA: Diagnosis not present

## 2023-11-15 DIAGNOSIS — N183 Chronic kidney disease, stage 3 unspecified: Secondary | ICD-10-CM | POA: Diagnosis not present

## 2023-11-15 DIAGNOSIS — D472 Monoclonal gammopathy: Secondary | ICD-10-CM | POA: Diagnosis not present

## 2023-11-15 DIAGNOSIS — I129 Hypertensive chronic kidney disease with stage 1 through stage 4 chronic kidney disease, or unspecified chronic kidney disease: Secondary | ICD-10-CM | POA: Diagnosis not present

## 2023-11-15 LAB — LAB REPORT - SCANNED: Creatinine, POC: 45.5 mg/dL

## 2023-11-17 ENCOUNTER — Ambulatory Visit: Payer: Medicare HMO

## 2023-11-18 NOTE — Progress Notes (Signed)
 Office Visit Note  Patient: Hannah [name]             Date of Birth: Jun 08, 1946           MRN: [id number]             PCP: Dorothyann Peng, MD Referring: Dorothyann Peng, MD Visit Date: 12/01/2023 Occupation: @GUAROCC @  Subjective:  Medication management   History of Present Illness: Hannah [name] is a 78 y.o. female with systemic lupus, rheumatoid arthritis, gout and osteoporosis.  She returns today after last visit in June 2024.  She has been on Plaquenil 200 mg p.o. twice daily Monday to Friday.  She is also on allopurinol 100 mg p.o. 3 days a week prescribed by Dr. Allyne Gee.  She is on Prolia 60 mg subcu every 6 months.  Last Prolia injection was on June 10, 2023.  She is scheduled for next Prolia injection in March 2025.  She had right total knee replacement on July 12, 2023 by Dr.Alusio.  She is having slow recovery.  She is still ambulating with the help of a walker.  She has been going to physical therapy twice a week.  Left total knee replacement is doing well.  Been having some discomfort in her right hand.  Patient states she was  hospitalized in December and had endoscopy and colonoscopy.  She was found to have some blood in her stool.  She was placed on pantoprazole for gastritis.    Activities of Daily Living:  Patient reports morning stiffness for 30 minutes.   Patient Denies nocturnal pain.  Difficulty dressing/grooming: Denies Difficulty climbing stairs: Reports Difficulty getting out of chair: Reports Difficulty using hands for taps, buttons, cutlery, and/or writing: Denies  Review of Systems  Constitutional:  Positive for fatigue.  HENT:  Negative for mouth sores and mouth dryness.   Eyes:  Positive for dryness. Negative for pain.  Respiratory:  Negative for shortness of breath and difficulty breathing.   Cardiovascular:  Negative for chest pain and palpitations.  Gastrointestinal:  Negative for blood in stool, constipation and diarrhea.   Endocrine: Negative for increased urination.  Genitourinary:  Negative for involuntary urination.  Musculoskeletal:  Positive for joint pain, gait problem, joint pain, joint swelling and morning stiffness. Negative for myalgias, muscle weakness, muscle tenderness and myalgias.  Skin:  Negative for color change, rash, hair loss and sensitivity to sunlight.  Allergic/Immunologic: Negative for susceptible to infections.  Neurological:  Positive for numbness. Negative for dizziness and headaches.  Hematological:  Negative for swollen glands.  Psychiatric/Behavioral:  Positive for sleep disturbance. Negative for depressed mood. The patient is not nervous/anxious.     PMFS History:  Patient Active Problem List   Diagnosis Date Noted   Class 1 obesity due to excess calories with serious comorbidity and body mass index (BMI) of 32.0 to 32.9 in adult 10/17/2023   Chronic superficial gastritis with bleeding 10/17/2023   Occult blood detected in feces by immunoassay 09/26/2023   ABLA (acute blood loss anemia) 09/23/2023   GI bleed 09/23/2023   Pure hypercholesterolemia 07/18/2023   Personal history of COVID-19 07/18/2023   Primary osteoarthritis of right knee 07/12/2023   Acquired thrombophilia (HCC) 01/12/2023   Arthrofibrosis of knee joint, right 07/06/2022   OA (osteoarthritis) of knee 02/02/2022   Primary osteoarthritis of left knee 02/02/2022   Chronic bilateral low back pain without sciatica 06/25/2021   Paresthesia and pain of extremity 06/25/2021   Atrial fibrillation (HCC) 04/16/2021   Demand  ischemia (HCC) 03/26/2021   Unspecified atrial fibrillation (HCC) 03/26/2021   COVID-19 virus infection 03/26/2021   PVC (premature ventricular contraction) 05/06/2020   Type 2 diabetes mellitus with stage 3 chronic kidney disease, without long-term current use of insulin (HCC) 03/20/2020   Palpitations 03/20/2020   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   Primary hypothyroidism  03/20/2020   Vitiligo 03/20/2020   Iron deficiency anemia 08/12/2017   Osteoporosis 10/02/2016   Systemic lupus erythematosus (HCC) 10/02/2016   Rheumatoid arthritis with rheumatoid factor of multiple sites without organ or systems involvement (HCC) 10/02/2016   High risk medication use 10/02/2016   History of chronic kidney disease 10/02/2016   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/2016   Primary osteoarthritis of both knees 10/02/2016   Vitamin D deficiency 10/02/2016   History of diabetes mellitus 10/02/2016   History of hypertension 10/02/2016   History of asthma 10/02/2016   Absolute anemia    MGUS (monoclonal gammopathy of unknown significance)    Normocytic anemia 03/21/2015   Essential hypertension 03/20/2015   Abnormal CT of the chest 07/30/2012   Asthma 06/19/2012    Past Medical History:  Diagnosis Date   Allergy 25 years ago.   Anemia    Arthritis    Asthma    Atrial fibrillation (HCC)    Cataract Had cataracts surgery 2015 & 2016   Chronic kidney disease    Diabetes mellitus without complication (HCC)    Gout 12/17/2014   patient reported   Heart murmur 4 years ago   Hyperlipidemia    Hypertension    Hypothyroidism    Systemic lupus erythematosus (HCC)    Vitiligo     Family History  Problem Relation Age of Onset   Heart disease Father    Diabetes Father    Hypertension Mother    Obesity Mother    Varicose Veins Mother    Hypertension Sister    Hypertension Sister    Leukemia Sister    Cancer Sister    Obesity Sister    Past Surgical History:  Procedure Laterality Date   BIOPSY  09/26/2023   Procedure: BIOPSY;  Surgeon: Meryl Dare, MD;  Location: [facility];  Service: Gastroenterology;;   CATARACT EXTRACTION Bilateral 2015   COLONOSCOPY WITH PROPOFOL N/A 09/27/2023   Procedure: COLONOSCOPY WITH PROPOFOL;  Surgeon: [name], MD;  Location: [facility];  Service: Gastroenterology;  Laterality: N/A;   DILATION  AND CURETTAGE OF UTERUS     DOPPLER ECHOCARDIOGRAPHY  05/2018   Internist to review with pt; potential heart murmur 06/20/18   ESOPHAGOGASTRODUODENOSCOPY (EGD) WITH PROPOFOL N/A 09/26/2023   Procedure: ESOPHAGOGASTRODUODENOSCOPY (EGD) WITH PROPOFOL;  Surgeon: Meryl Dare, MD;  Location: [facility];  Service: Gastroenterology;  Laterality: N/A;   EYE SURGERY  Cataract Surgery, 2015 & 2016.   keratosis removal  2021   KNEE CLOSED REDUCTION Left 07/06/2022   Procedure: CLOSED MANIPULATION KNEE;  Surgeon: Ollen Gross, MD;  Location: [facility];  Service: Orthopedics;  Laterality: Left;   KNEE CLOSED REDUCTION Right 09/13/2023   Procedure: CLOSED MANIPULATION KNEE;  Surgeon: Ollen Gross, MD;  Location: [facility];  Service: Orthopedics;  Laterality: Right;   REPLACEMENT TOTAL KNEE Left    SKIN SURGERY  11/30/2018   left side of face   TOOTH EXTRACTION     TOTAL KNEE ARTHROPLASTY Left 02/02/2022   Procedure: TOTAL KNEE ARTHROPLASTY;  Surgeon: Ollen Gross, MD;  Location: [facility];  Service: Orthopedics;  Laterality: Left;  TOTAL KNEE ARTHROPLASTY Right 07/12/2023   Procedure: RIGHT TOTAL KNEE ARTHROPLASTY;  Surgeon: Ollen Gross, MD;  Location: [facility];  Service: Orthopedics;  Laterality: Right;   Social History   Social History Narrative   Not on file   Immunization History  Administered Date(s) Administered   19-influenza Whole 07/15/2012   DTaP 06/18/2019   Fluad Quad(high Dose 65+) 06/19/2020, 06/03/2021   Fluad Trivalent(High Dose 65+) 09/25/2023   H1N1 08/09/2008   Influenza Split 07/27/2008, 06/28/2014   Influenza, High Dose Seasonal PF 05/18/2019, 06/04/2022   Influenza,inj,Quad PF,6+ Mos 06/20/2018   MMR 06/20/2010   Moderna Covid-19 Fall Seasonal Vaccine 61yrs & older 07/27/2022   Moderna SARS-COV2 Booster Vaccination 01/01/2021   Moderna Sars-Covid-2 Vaccination 11/20/2019, 12/19/2019, 08/06/2020, 01/01/2021, 08/13/2021   Pfizer Covid-19 Vaccine Bivalent Booster  7yrs & up 07/27/2022   Pneumococcal Conjugate-13 05/04/2018   Pneumococcal Polysaccharide-23 05/18/2019   Pneumococcal-Unspecified 06/28/2014   RSV,unspecified 10/22/2022   Tdap 06/15/2019   Tetanus 07/22/2005   Zoster Recombinant(Shingrix) 02/19/2021, 04/24/2021     Objective: Vital Signs: BP (!) 149/83 (BP Location: Left Arm, Patient Position: Sitting, Cuff Size: Normal)   Pulse 68   Resp 13   Ht 4\' 11"  (1.499 m)   Wt 153 lb 6.4 oz (69.6 kg)   BMI 30.98 kg/m    Physical Exam Vitals and nursing note reviewed.  Constitutional:      Appearance: She is well-developed.  HENT:     Head: Normocephalic and atraumatic.  Eyes:     Conjunctiva/sclera: Conjunctivae normal.  Cardiovascular:     Rate and Rhythm: Normal rate and regular rhythm.     Heart sounds: Normal heart sounds.  Pulmonary:     Effort: Pulmonary effort is normal.     Breath sounds: Normal breath sounds.  Abdominal:     General: Bowel sounds are normal.     Palpations: Abdomen is soft.  Musculoskeletal:     Cervical back: Normal range of motion.  Lymphadenopathy:     Cervical: No cervical adenopathy.  Skin:    General: Skin is warm and dry.     Capillary Refill: Capillary refill takes less than 2 seconds.  Neurological:     Mental Status: She is alert and oriented to person, place, and time.  Psychiatric:        Behavior: Behavior normal.      Musculoskeletal Exam: Patient was examined in the seated position.  Cervical spine was in good range of motion.  Shoulders, elbows, wrist joints with good range of motion.  She had thickening of MCP joints without any synovitis.  No synovitis was noted over PIP and DIP joints.  Hip joints could not be assessed in the seated position.  Right knee joint was warm and had limited extension.  Left knee joint was in good range of motion which was also replaced.  Ankles were in good range of motion.  There was no tenderness over ankles or MTPs.  CDAI Exam: CDAI Score:  -- Patient Global: --; Provider Global: -- Swollen: --; Tender: -- Joint Exam 12/01/2023   No joint exam has been documented for this visit   There is currently no information documented on the homunculus. Go to the Rheumatology activity and complete the homunculus joint exam.  Investigation: No additional findings.  Imaging: No results found.  Recent Labs: Lab Results  Component Value Date   WBC 6.1 11/25/2023   HGB 9.8 (L) 11/25/2023   PLT 206 11/25/2023   NA 142 11/25/2023  K 4.3 11/25/2023   CL 109 11/25/2023   CO2 28 11/25/2023   GLUCOSE 109 (H) 11/25/2023   BUN 21 11/25/2023   CREATININE 1.20 (H) 11/25/2023   BILITOT 0.4 11/25/2023   ALKPHOS 64 11/25/2023   AST 21 11/25/2023   ALT 12 11/25/2023   PROT 6.8 11/25/2023   ALBUMIN 4.0 11/25/2023   CALCIUM 9.3 11/25/2023   GFRAA 44 (L) 12/19/2020   May 26, 2023 urine protein creatinine ratio 298, dsDNA 28, sed rate 34, C3-C4 normal, vitamin D 57  Speciality Comments: PLQ eye exam: 12/24/2022 [facility] Follow up in 1 year  Procedures:  No procedures performed Allergies: Shellfish allergy   Assessment / Plan:     Visit Diagnoses: Other systemic lupus erythematosus with other organ involvement (HCC) - Positive ANA, positive Smith, positive RNP: -Patient denies any history of oral ulcers, nasal ulcers, malar rash, full sensitivity, Raynaud's, lymphadenopathy or photosensitivity.  She has been having pain and discomfort in her right knee joint due to recent surgery.  She has not had autoimmune labs since August 2024.  Double-stranded DNA was positive and complements were normal.  Sed rate was mildly elevated at that time.  Will recheck labs today.  Plan: Protein / creatinine ratio, urine, Anti-DNA antibody, double-stranded, C3 and C4, Sedimentation rate  High risk medication use - Plaquenil 200 mg 1 tablet by mouth twice daily Monday through Friday. PLQ eye exam: 12/24/2022 -patient was reminded to get repeat  labs this year.  Will check labs today.  Information on immunization was placed in the AVS.  Plan: CBC with Differential/Platelet, COMPLETE METABOLIC PANEL WITH GFR.  Patient had recent labs after GI bleed.  Will repeat labs today.  Rheumatoid arthritis involving multiple sites with positive rheumatoid factor (HCC) - Positive RF, positive anti-CCP: No synovitis was noted on the examination.  Right knee joint was inflamed due to recent surgery.  Age-related osteoporosis without current pathological fracture - DEXA 09/03/22: RFN T-score -2.4 BMD 0.581.Previous DEXA 08/09/20: Total right hip BMD 0.660 with T score -2.4.prolia 60 mg sq injections every 6 months.  Last Prolia injection was in September 2024.  She is scheduled to have next Prolia injection this month.  Will obtain labs today.  Vitamin D deficiency -I will check vitamin D level as patient is on Prolia.  Last vitamin D was 57 in August 2024.  Plan: VITAMIN D 25 Hydroxy (Vit-D Deficiency, Fractures)  Idiopathic chronic gout of multiple sites without tophus -patient denies having a gout flare.  She is on allopurinol 100 mg 3 times a week.  Plan: Uric acid  Status post total knee replacement, left - Performed on 02/02/2022 by Dr. Despina Hick.  Completed physical therapy.  Status post total knee replacement, right - 07/12/23 by Dr. Despina Hick.  She is still having pain and swelling and limited had limited extension.  Patient is going to physical therapy.  MGUS (monoclonal gammopathy of unknown significance) -followed by Dr. Candise Che  History of hypertension-blood pressure was elevated 149/83.  Chronic anticoagulation-she is on Eliquis.  She had recent Hemoccult positive stool per patient and was placed on pantoprazole for gastritis.  History of diabetes mellitus  Paroxysmal atrial fibrillation (HCC)  History of chronic kidney disease  History of asthma  History of anemia  Orders: Orders Placed This Encounter  Procedures   Protein / creatinine  ratio, urine   CBC with Differential/Platelet   COMPLETE METABOLIC PANEL WITH GFR   Anti-DNA antibody, double-stranded   C3 and C4  Sedimentation rate   VITAMIN D 25 Hydroxy (Vit-D Deficiency, Fractures)   Uric acid   No orders of the defined types were placed in this encounter.    Follow-Up Instructions: Return in about 5 months (around 05/02/2024) for Systemic lupus, Osteoarthritis.   Pollyann Savoy, MD  Note - This record has been created using Animal nutritionist.  Chart creation errors have been sought, but may not always  have been located. Such creation errors do not reflect on  the standard of medical care.

## 2023-11-19 ENCOUNTER — Telehealth: Payer: Self-pay | Admitting: Rheumatology

## 2023-11-19 NOTE — Telephone Encounter (Signed)
Patient called stating she is getting conflicting information from her insurance company regarding the approval of her Prolia injection.  Patient states she called to find out what her copay would be and was told they need another authorization sent to [facility].  Patient requested a return call.

## 2023-11-22 DIAGNOSIS — D509 Iron deficiency anemia, unspecified: Secondary | ICD-10-CM | POA: Diagnosis not present

## 2023-11-23 ENCOUNTER — Ambulatory Visit: Payer: Medicare HMO

## 2023-11-23 DIAGNOSIS — M25561 Pain in right knee: Secondary | ICD-10-CM | POA: Diagnosis not present

## 2023-11-23 DIAGNOSIS — R262 Difficulty in walking, not elsewhere classified: Secondary | ICD-10-CM | POA: Diagnosis not present

## 2023-11-23 DIAGNOSIS — M6281 Muscle weakness (generalized): Secondary | ICD-10-CM | POA: Diagnosis not present

## 2023-11-23 DIAGNOSIS — G8929 Other chronic pain: Secondary | ICD-10-CM | POA: Diagnosis not present

## 2023-11-23 NOTE — Therapy (Signed)
 OUTPATIENT PHYSICAL THERAPY LOWER EXTREMITY TREATMENT   Patient Name: Hannah [name] MRN: [id number] DOB:July 19, 1946, 78 y.o., female Today's Date:    END OF SESSION:  PT End of Session - 11/23/23 1536     Visit Number 26    Number of Visits 33    Date for PT Re-Evaluation 12/10/23    Authorization Type [employer]    Authorization Time Period Approved 12 visits 10/25/23-12/11/23    Authorization - Visit Number 5    Authorization - Number of Visits 12    Progress Note Due on Visit 30    PT Start Time 1504    PT Stop Time 1548    PT Time Calculation (min) 44 min    Activity Tolerance Patient tolerated treatment well    Behavior During Therapy WFL for tasks assessed/performed                     Past Medical History:  Diagnosis Date   Allergy 25 years ago.   Anemia    Arthritis    Asthma    Atrial fibrillation (HCC)    Cataract Had cataracts surgery 2015 & 2016   Chronic kidney disease    Diabetes mellitus without complication (HCC)    Gout 12/17/2014   patient reported   Heart murmur 4 years ago   Hyperlipidemia    Hypertension    Hypothyroidism    Systemic lupus erythematosus (HCC)    Vitiligo    Past Surgical History:  Procedure Laterality Date   BIOPSY  09/26/2023   Procedure: BIOPSY;  Surgeon: Meryl Dare, MD;  Location: [facility];  Service: Gastroenterology;;   CATARACT EXTRACTION Bilateral 2015   COLONOSCOPY WITH PROPOFOL N/A 09/27/2023   Procedure: COLONOSCOPY WITH PROPOFOL;  Surgeon: [name], MD;  Location: [facility];  Service: Gastroenterology;  Laterality: N/A;   DILATION AND CURETTAGE OF UTERUS     DOPPLER ECHOCARDIOGRAPHY  05/2018   Internist to review with pt; potential heart murmur 06/20/18   ESOPHAGOGASTRODUODENOSCOPY (EGD) WITH PROPOFOL N/A 09/26/2023   Procedure: ESOPHAGOGASTRODUODENOSCOPY (EGD) WITH PROPOFOL;  Surgeon: Meryl Dare, MD;  Location: [facility];  Service: Gastroenterology;   Laterality: N/A;   EYE SURGERY  Cataract Surgery, 2015 & 2016.   keratosis removal  2021   KNEE CLOSED REDUCTION Left 07/06/2022   Procedure: CLOSED MANIPULATION KNEE;  Surgeon: Ollen Gross, MD;  Location: [facility];  Service: Orthopedics;  Laterality: Left;   KNEE CLOSED REDUCTION Right 09/13/2023   Procedure: CLOSED MANIPULATION KNEE;  Surgeon: Ollen Gross, MD;  Location: [facility];  Service: Orthopedics;  Laterality: Right;   REPLACEMENT TOTAL KNEE Left    SKIN SURGERY  11/30/2018   left side of face   TOOTH EXTRACTION     TOTAL KNEE ARTHROPLASTY Left 02/02/2022   Procedure: TOTAL KNEE ARTHROPLASTY;  Surgeon: Ollen Gross, MD;  Location: [facility];  Service: Orthopedics;  Laterality: Left;   TOTAL KNEE ARTHROPLASTY Right 07/12/2023   Procedure: RIGHT TOTAL KNEE ARTHROPLASTY;  Surgeon: Ollen Gross, MD;  Location: [facility];  Service: Orthopedics;  Laterality: Right;   Patient Active Problem List   Diagnosis Date Noted   Class 1 obesity due to excess calories with serious comorbidity and body mass index (BMI) of 32.0 to 32.9 in adult 10/17/2023   Chronic superficial gastritis with bleeding 10/17/2023   Occult blood detected in feces by immunoassay 09/26/2023   ABLA (acute blood loss anemia) 09/23/2023   GI bleed 09/23/2023   Pure  hypercholesterolemia 07/18/2023   Personal history of COVID-19 07/18/2023   Primary osteoarthritis of right knee 07/12/2023   Acquired thrombophilia (HCC) 01/12/2023   Arthrofibrosis of knee joint, right 07/06/2022   OA (osteoarthritis) of knee 02/02/2022   Primary osteoarthritis of left knee 02/02/2022   Chronic bilateral low back pain without sciatica 06/25/2021   Paresthesia and pain of extremity 06/25/2021   Atrial fibrillation (HCC) 04/16/2021   Demand ischemia (HCC) 03/26/2021   Unspecified atrial fibrillation (HCC) 03/26/2021   COVID-19 virus infection 03/26/2021   PVC (premature ventricular contraction) 05/06/2020   Type 2 diabetes mellitus with  stage 3 chronic kidney disease, without long-term current use of insulin (HCC) 03/20/2020   Palpitations 03/20/2020   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   Primary hypothyroidism 03/20/2020   Vitiligo 03/20/2020   Iron deficiency anemia 08/12/2017   Osteoporosis 10/02/2016   Systemic lupus erythematosus (HCC) 10/02/2016   Rheumatoid arthritis with rheumatoid factor of multiple sites without organ or systems involvement (HCC) 10/02/2016   High risk medication use 10/02/2016   History of chronic kidney disease 10/02/2016   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/2016   Primary osteoarthritis of both knees 10/02/2016   Vitamin D deficiency 10/02/2016   History of diabetes mellitus 10/02/2016   History of hypertension 10/02/2016   History of asthma 10/02/2016   Absolute anemia    MGUS (monoclonal gammopathy of unknown significance)    Normocytic anemia 03/21/2015   Essential hypertension 03/20/2015   Abnormal CT of the chest 07/30/2012   Asthma 06/19/2012    PCP: Dorothyann Peng, MD  REFERRING PROVIDER: Ollen Gross, MD   REFERRING DIAG: M17.11 (ICD-10-CM) - Unilateral primary osteoarthritis, right knee   THERAPY DIAG:  Chronic pain of right knee  Muscle weakness (generalized)  Difficulty in walking, not elsewhere classified  Rationale for Evaluation and Treatment: Rehabilitation  ONSET DATE: 07/12/23  SUBJECTIVE:   SUBJECTIVE STATEMENT: 11/23/23: Pt reports she was sick lase week. She states she has increased the wearing time of the ext brace. Pt notes she is becoming more ind, leaving her home and running errands on her own.  EVAL: Pt reports she has been completing her HEP 1x daily and using cold packs on a frequent basis for pain and swelling management.  PAIN:  Are you having pain? Yes: NPRS scale: 4/10 intermittent; pain range 5-10/10 Pain location: R knee Pain description: ache ,throb ,sharp Aggravating factors: movement,  lifting, wt bearing Relieving factors: Pain medication, muscle relaxer, cold packs  PERTINENT HISTORY: High BMI, L LKA, Afib  PRECAUTIONS: Knee  RED FLAGS: None   WEIGHT BEARING RESTRICTIONS: No  FALLS:  Has patient fallen in last 6 months? No  LIVING ENVIRONMENT: Lives with: lives alone Lives in: House/apartment Stairs: No Has following equipment at home: Single point cane, Environmental consultant - 2 wheeled, Wheelchair (manual), and bed side commode  OCCUPATION: Retired  PLOF: Independent with household mobility with device  PATIENT GOALS: good use of my R knee  NEXT MD VISIT: 07/27/23  OBJECTIVE:  Note: Objective measures were completed at Evaluation unless otherwise noted.  DIAGNOSTIC FINDINGS: None for the R knee in Epic  PATIENT SURVEYS:  FOTO: Perceived function   22%, predicted   43%   COGNITION: Overall cognitive status: Within functional limits for tasks assessed     SENSATION: WFL  EDEMA:  Significant R knee edema present   MUSCLE LENGTH: Hamstrings: Right NT deg; Left NT deg Maisie Fus test: Right NT deg; Left NT deg  POSTURE: rounded shoulders, forward head, and anterior pelvic tilt  PALPATION: TTP to the R peri-knee  LOWER EXTREMITY ROM:  Active ROM Right eval Left eval Rt 07/20/23 Rt 07/22/23 RT 07/27/23 Rt 07/29/23 08/03/23 Rt 08/06/23  Hip flexion          Hip extension          Hip abduction          Hip adduction          Hip internal rotation          Hip external rotation          Knee flexion 50  70 AA 80 AA75 AA85 AA 72 AA85  Knee extension 30  30 30 23  25d  30  Ankle dorsiflexion          Ankle plantarflexion          Ankle inversion          Ankle eversion           (Blank rows = not tested) 08/11/23:30-85d. 08/19/23=AAROM 30-90d. 08/31/23=AA90d   09/02/23=AA 30-85d. 09/08/23=AA30-90. 09/14/23=AA34-80: 10/01/23: 28-90 AA (end of session seated). 10/05/23: 28-90 AA (end of session supine). 10/12/23: 33d AA (end of session supine). 10/12/23: 33d  AA (end of session supine). 10/15/23: 28d AA (end of session supine); 10/19/23:86-28d AA (end of session supine). 10/22/23:86-28d AA (end of session supine). 10/26/23: ext= 28AA. 11/02/23: pre PT 33d; post PT 27dAA; post PT ext ROM= -25d. 11/09/23=post PT ext AAROM= -25d.  11/23/23=post PT ext AAROM= -25d.   LOWER EXTREMITY MMT:  MMT Right eval Left eval RT 07/27/23 Rt 09/02/23  Hip flexion 2  3 with limited ROM 4+  Hip extension 2   4+  Hip abduction 2   4+  Hip adduction      Hip internal rotation      Hip external rotation 2   4+  Knee flexion 2   4+ with limited ROM  Knee extension 2  3 with limited ROM 4+ with limited ROM  Ankle dorsiflexion      Ankle plantarflexion      Ankle inversion      Ankle eversion       (Blank rows = not tested)  LOWER EXTREMITY SPECIAL TESTS:  NT  FUNCTIONAL TESTS:  5 times sit to stand: TBA 2 minute walk test: TBA  5xSTS=24.7 c arm rest  2MWT=78' c RW GAIT: Distance walked: 37ft Assistive device utilized: Environmental consultant - 2 wheeled Level of assistance: SBA Comments: Antalgic over the R LE  TRANSFER: Mod A for mat table transfer c A for the R LE  TODAY'S TREATMENT:  [facility] Adult PT Treatment:                                                DATE: 11/23/23 Manual Therapy: A/P and P/A fem/tib grade 4 mobs  Contact/relax stretching for knee extension in supine and prone LAD R LE Grade 4 patella mobs all directions with emphasis on superior Therapeutic Activity: Nustep 8 mins L5 UE/LE  [facility] Adult PT Treatment:                                                DATE:  11/09/23 Manual Therapy: A/P and P/A fem/tib grade 4 mobs  Contact/relax stretching for knee extension in prone LAD R LE Grade 4 patella mobs all directions with emphasis on superior Therapeutic Activity: Nustep 8 mins L5 UE/LE  [facility] Adult PT Treatment:                                                DATE: 11/04/23 Manual Therapy: A/P and P/A fem/tib grade 4 mobs  Contact/relax stretching for  knee extension in prone LAD R LE Grade 4 patella mobs all directions with emphasis on sup Therapeutic Activity: Nustep 8 mins L5 UE/LE  PATIENT EDUCATION:  Education details: Eval findings, POC, HEP, self care  Person educated: Patient Education method: Explanation, Demonstration, Tactile cues, Verbal cues, and Handouts Education comprehension: verbalized understanding, returned demonstration, verbal cues required, and tactile cues required  HOME EXERCISE PROGRAM: Access Code: [email or web address] URL: [email or web address] Date: 08/19/2023 Prepared by: Joellyn Rued  Exercises - Long Sitting Quad Set (Mirrored)  - 3 x daily - 7 x weekly - 1 sets - 10 reps - 5 hold - Supine Heel Slide with Strap  - 3 x daily - 7 x weekly - 1 sets - 10 reps - 10 hold - Supine Knee Extension Strengthening (Mirrored)  - 3 x daily - 7 x weekly - 1 sets - 10 reps - 3 hold - Supine Hip Abduction AROM (Mirrored)  - 3 x daily - 7 x weekly - 1 sets - 10 reps - 3 hold - Seated Knee Flexion Slide  - 3 x daily - 7 x weekly - 1 sets - 10 reps - 10 hold - Active Straight Leg Raise with Quad Set  - 3 x daily - 7 x weekly - 1 sets - 10 reps - Seated Knee Extension Stretch with Chair  - 3 x daily - 7 x weekly - 1 sets  ASSESSMENT:  CLINICAL IMPRESSION: 11/23/23: Pt returns to PT after 2 weeks due to being sick. Continued PT to increase R knee extension ROM. At end of session R knee AAROM measured -25d. Gains in extension ROM have been minimal. Will continue PT to address ext ROM, but will also incorporate strengthening, function, and R knee flexion ROM into the treatment sessions..  OBJECTIVE IMPAIRMENTS: decreased activity tolerance, decreased mobility, difficulty walking, decreased ROM, decreased strength, increased edema, obesity, and pain.   ACTIVITY LIMITATIONS: carrying, lifting, bending, sitting, standing, squatting, sleeping, stairs, transfers, bed mobility, bathing, toileting, dressing, locomotion level, and  caring for others  PARTICIPATION LIMITATIONS: meal prep, cleaning, laundry, and driving  PERSONAL FACTORS: Age, Fitness, Past/current experiences, Time since onset of injury/illness/exacerbation, and 1 comorbidity: high BMI  are also affecting patient's functional outcome.   REHAB POTENTIAL: Good  CLINICAL DECISION MAKING: Stable/uncomplicated  EVALUATION COMPLEXITY: Low   GOALS:  SHORT TERM GOALS: Target date: 08/06/23 Pt will be Ind in an initial HEP  Baseline: started Goal status: MET  2.  Pt will be able to complete a SLR and SAQ with or without quad lag Baseline: not able to complete 07/29/23: able to complete both with limited knee ext ROM present Goal status: MET  3.  Increase R knee AROM to 15-80d for improved R knee function Baseline: 30-50 10/01/23: AAROM 28-90 Goal status: PARTIALLY MET FOR FLEXION  LONG TERM GOALS: Target date: 10/22/23  Pt will be Ind in  a final HEP to maintain achieved LOF Baseline: started Goal status: ONGOING  2.  Increase R hip and knee strength to 4/5 for improved function use of the R LE Baseline: see flow sheets 09/02/23: see flow sheets Goal status: MET  3.  Increased R knee AROM to 5-115d for appropriate fuctional mobility Baseline: 30-50d 09/02/23: 30-85d 10/22/23: 28-86d 11/04/23:  25d lacking Goal status: NOT MET  4.  Improve 5xSTS by MCID of 5" and by MCID of 39ft as indication of improved functional mobility  Baseline: TBA when pt has progressed to gait c a [facility] 08/31/23: 5xSTS=24.7 c arm rest      2MWT=78' c RW 10/15/23:  5xSTS=11.8 s arm rest      2MWT= 185' c RW Goal status: MET  5.  Pt's FOTO score will improved to the predicted value of 43% as indication of improved function  Baseline: 22% 10/22/23:48% Goal status: MET  6.  Pt will be able to walk 487ft c or s a SPC and ascend/descend 12 steps c or s a SPC and c use of a HR for community mobility Baseline: 102ft c RW 10/22/23: 319ft  Goal status:  ONGOING   PLAN:  PT FREQUENCY: 2x/week  PT DURATION: 6 weeks  PLANNED INTERVENTIONS: 97164- PT Re-evaluation, 97110-Therapeutic exercises, 97530- Therapeutic activity, 97535- Self Care, 16109- Manual therapy, [id number]- Gait training, 97014- Electrical stimulation (unattended), [id number]- Electrical stimulation (manual), 97016- Vasopneumatic device, Stair training, Dry Needling, Joint mobilization, Cryotherapy, and Moist heat  PLAN FOR NEXT SESSION: Review FOTO; assess response to HEP; progress therex as indicated; use of modalities, manual therapy; and TPDN as indicated.   Alitzel Cookson MS, PT 11/23/23 4:10 PM

## 2023-11-24 ENCOUNTER — Other Ambulatory Visit: Payer: Self-pay

## 2023-11-24 DIAGNOSIS — D472 Monoclonal gammopathy: Secondary | ICD-10-CM

## 2023-11-24 DIAGNOSIS — D508 Other iron deficiency anemias: Secondary | ICD-10-CM

## 2023-11-25 ENCOUNTER — Inpatient Hospital Stay: Payer: Medicare HMO | Attending: Hematology

## 2023-11-25 ENCOUNTER — Other Ambulatory Visit (HOSPITAL_COMMUNITY): Payer: Self-pay

## 2023-11-25 ENCOUNTER — Other Ambulatory Visit: Payer: Self-pay | Admitting: Pharmacist

## 2023-11-25 DIAGNOSIS — D649 Anemia, unspecified: Secondary | ICD-10-CM | POA: Insufficient documentation

## 2023-11-25 DIAGNOSIS — D472 Monoclonal gammopathy: Secondary | ICD-10-CM | POA: Diagnosis not present

## 2023-11-25 DIAGNOSIS — Z79899 Other long term (current) drug therapy: Secondary | ICD-10-CM | POA: Insufficient documentation

## 2023-11-25 DIAGNOSIS — D508 Other iron deficiency anemias: Secondary | ICD-10-CM

## 2023-11-25 DIAGNOSIS — M810 Age-related osteoporosis without current pathological fracture: Secondary | ICD-10-CM

## 2023-11-25 LAB — CBC WITH DIFFERENTIAL (CANCER CENTER ONLY)
Abs Immature Granulocytes: 0.03 10*3/uL (ref 0.00–0.07)
Basophils Absolute: 0.1 10*3/uL (ref 0.0–0.1)
Basophils Relative: 1 %
Eosinophils Absolute: 0.5 10*3/uL (ref 0.0–0.5)
Eosinophils Relative: 8 %
HCT: 31.4 % — ABNORMAL LOW (ref 36.0–46.0)
Hemoglobin: 9.8 g/dL — ABNORMAL LOW (ref 12.0–15.0)
Immature Granulocytes: 1 %
Lymphocytes Relative: 13 %
Lymphs Abs: 0.8 10*3/uL (ref 0.7–4.0)
MCH: 30.7 pg (ref 26.0–34.0)
MCHC: 31.2 g/dL (ref 30.0–36.0)
MCV: 98.4 fL (ref 80.0–100.0)
Monocytes Absolute: 0.4 10*3/uL (ref 0.1–1.0)
Monocytes Relative: 7 %
Neutro Abs: 4.3 10*3/uL (ref 1.7–7.7)
Neutrophils Relative %: 70 %
Platelet Count: 206 10*3/uL (ref 150–400)
RBC: 3.19 MIL/uL — ABNORMAL LOW (ref 3.87–5.11)
RDW: 14.6 % (ref 11.5–15.5)
WBC Count: 6.1 10*3/uL (ref 4.0–10.5)
nRBC: 0 % (ref 0.0–0.2)

## 2023-11-25 LAB — CMP (CANCER CENTER ONLY)
ALT: 12 U/L (ref 0–44)
AST: 21 U/L (ref 15–41)
Albumin: 4 g/dL (ref 3.5–5.0)
Alkaline Phosphatase: 64 U/L (ref 38–126)
Anion gap: 5 (ref 5–15)
BUN: 21 mg/dL (ref 8–23)
CO2: 28 mmol/L (ref 22–32)
Calcium: 9.3 mg/dL (ref 8.9–10.3)
Chloride: 109 mmol/L (ref 98–111)
Creatinine: 1.2 mg/dL — ABNORMAL HIGH (ref 0.44–1.00)
GFR, Estimated: 46 mL/min — ABNORMAL LOW (ref >60)
Glucose, Bld: 109 mg/dL — ABNORMAL HIGH (ref 70–99)
Potassium: 4.3 mmol/L (ref 3.5–5.1)
Sodium: 142 mmol/L (ref 135–145)
Total Bilirubin: 0.4 mg/dL (ref 0.0–1.2)
Total Protein: 6.8 g/dL (ref 6.5–8.1)

## 2023-11-25 LAB — FERRITIN: Ferritin: 147 ng/mL (ref 11–307)

## 2023-11-25 LAB — IRON AND IRON BINDING CAPACITY (CC-WL,HP ONLY)
Iron: 43 ug/dL (ref 28–170)
Saturation Ratios: 18 % (ref 10.4–31.8)
TIBC: 238 ug/dL — ABNORMAL LOW (ref 250–450)
UIBC: 195 ug/dL (ref 148–442)

## 2023-11-25 NOTE — Therapy (Signed)
 OUTPATIENT PHYSICAL THERAPY LOWER EXTREMITY TREATMENT   Patient Name: Hannah [name] MRN: [id number] DOB:Sep 28, 1946, 78 y.o., female Today's Date:    END OF SESSION:            Past Medical History:  Diagnosis Date   Allergy 25 years ago.   Anemia    Arthritis    Asthma    Atrial fibrillation (HCC)    Cataract Had cataracts surgery 2015 & 2016   Chronic kidney disease    Diabetes mellitus without complication (HCC)    Gout 12/17/2014   patient reported   Heart murmur 4 years ago   Hyperlipidemia    Hypertension    Hypothyroidism    Systemic lupus erythematosus (HCC)    Vitiligo    Past Surgical History:  Procedure Laterality Date   BIOPSY  09/26/2023   Procedure: BIOPSY;  Surgeon: Meryl Dare, MD;  Location: [facility];  Service: Gastroenterology;;   CATARACT EXTRACTION Bilateral 2015   COLONOSCOPY WITH PROPOFOL N/A 09/27/2023   Procedure: COLONOSCOPY WITH PROPOFOL;  Surgeon: [name], MD;  Location: [facility];  Service: Gastroenterology;  Laterality: N/A;   DILATION AND CURETTAGE OF UTERUS     DOPPLER ECHOCARDIOGRAPHY  05/2018   Internist to review with pt; potential heart murmur 06/20/18   ESOPHAGOGASTRODUODENOSCOPY (EGD) WITH PROPOFOL N/A 09/26/2023   Procedure: ESOPHAGOGASTRODUODENOSCOPY (EGD) WITH PROPOFOL;  Surgeon: Meryl Dare, MD;  Location: [facility];  Service: Gastroenterology;  Laterality: N/A;   EYE SURGERY  Cataract Surgery, 2015 & 2016.   keratosis removal  2021   KNEE CLOSED REDUCTION Left 07/06/2022   Procedure: CLOSED MANIPULATION KNEE;  Surgeon: Ollen Gross, MD;  Location: [facility];  Service: Orthopedics;  Laterality: Left;   KNEE CLOSED REDUCTION Right 09/13/2023   Procedure: CLOSED MANIPULATION KNEE;  Surgeon: Ollen Gross, MD;  Location: [facility];  Service: Orthopedics;  Laterality: Right;   REPLACEMENT TOTAL KNEE Left    SKIN SURGERY  11/30/2018   left side of face   TOOTH EXTRACTION     TOTAL KNEE  ARTHROPLASTY Left 02/02/2022   Procedure: TOTAL KNEE ARTHROPLASTY;  Surgeon: Ollen Gross, MD;  Location: [facility];  Service: Orthopedics;  Laterality: Left;   TOTAL KNEE ARTHROPLASTY Right 07/12/2023   Procedure: RIGHT TOTAL KNEE ARTHROPLASTY;  Surgeon: Ollen Gross, MD;  Location: [facility];  Service: Orthopedics;  Laterality: Right;   Patient Active Problem List   Diagnosis Date Noted   Class 1 obesity due to excess calories with serious comorbidity and body mass index (BMI) of 32.0 to 32.9 in adult 10/17/2023   Chronic superficial gastritis with bleeding 10/17/2023   Occult blood detected in feces by immunoassay 09/26/2023   ABLA (acute blood loss anemia) 09/23/2023   GI bleed 09/23/2023   Pure hypercholesterolemia 07/18/2023   Personal history of COVID-19 07/18/2023   Primary osteoarthritis of right knee 07/12/2023   Acquired thrombophilia (HCC) 01/12/2023   Arthrofibrosis of knee joint, right 07/06/2022   OA (osteoarthritis) of knee 02/02/2022   Primary osteoarthritis of left knee 02/02/2022   Chronic bilateral low back pain without sciatica 06/25/2021   Paresthesia and pain of extremity 06/25/2021   Atrial fibrillation (HCC) 04/16/2021   Demand ischemia (HCC) 03/26/2021   Unspecified atrial fibrillation (HCC) 03/26/2021   COVID-19 virus infection 03/26/2021   PVC (premature ventricular contraction) 05/06/2020   Type 2 diabetes mellitus with stage 3 chronic kidney disease, without long-term current use of insulin (HCC) 03/20/2020   Palpitations 03/20/2020   Hypertensive heart and  renal disease 03/20/2020   Primary hypothyroidism 03/20/2020   Vitiligo 03/20/2020   Iron deficiency anemia 08/12/2017   Osteoporosis 10/02/2016   Systemic lupus erythematosus (HCC) 10/02/2016   Rheumatoid arthritis with rheumatoid factor of multiple sites without organ or systems involvement (HCC) 10/02/2016   High risk medication use 10/02/2016   History of chronic kidney disease 10/02/2016    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/2016   Primary osteoarthritis of both knees 10/02/2016   Vitamin D deficiency 10/02/2016   History of diabetes mellitus 10/02/2016   History of hypertension 10/02/2016   History of asthma 10/02/2016   Absolute anemia    MGUS (monoclonal gammopathy of unknown significance)    Normocytic anemia 03/21/2015   Essential hypertension 03/20/2015   Abnormal CT of the chest 07/30/2012   Asthma 06/19/2012    PCP: Dorothyann Peng, MD  REFERRING PROVIDER: Ollen Gross, MD   REFERRING DIAG: M17.11 (ICD-10-CM) - Unilateral primary osteoarthritis, right knee   THERAPY DIAG:  No diagnosis found.  Rationale for Evaluation and Treatment: Rehabilitation  ONSET DATE: 07/12/23  SUBJECTIVE:   SUBJECTIVE STATEMENT: 11/23/23: Pt reports she was sick lase week. She states she has increased the wearing time of the ext brace. Pt notes she is becoming more ind, leaving her home and running errands on her own.  EVAL: Pt reports she has been completing her HEP 1x daily and using cold packs on a frequent basis for pain and swelling management.  PAIN:  Are you having pain? Yes: NPRS scale: 4/10 intermittent; pain range 5-10/10 Pain location: R knee Pain description: ache ,throb ,sharp Aggravating factors: movement, lifting, wt bearing Relieving factors: Pain medication, muscle relaxer, cold packs  PERTINENT HISTORY: High BMI, L LKA, Afib  PRECAUTIONS: Knee  RED FLAGS: None   WEIGHT BEARING RESTRICTIONS: No  FALLS:  Has patient fallen in last 6 months? No  LIVING ENVIRONMENT: Lives with: lives alone Lives in: House/apartment Stairs: No Has following equipment at home: Single point cane, Environmental consultant - 2 wheeled, Wheelchair (manual), and bed side commode  OCCUPATION: Retired  PLOF: Independent with household mobility with device  PATIENT GOALS: good use of my R knee  NEXT MD VISIT: 07/27/23  OBJECTIVE:  Note: Objective  measures were completed at Evaluation unless otherwise noted.  DIAGNOSTIC FINDINGS: None for the R knee in Epic  PATIENT SURVEYS:  FOTO: Perceived function   22%, predicted   43%   COGNITION: Overall cognitive status: Within functional limits for tasks assessed     SENSATION: WFL  EDEMA:  Significant R knee edema present   MUSCLE LENGTH: Hamstrings: Right NT deg; Left NT deg Thomas test: Right NT deg; Left NT deg  POSTURE: rounded shoulders, forward head, and anterior pelvic tilt  PALPATION: TTP to the R peri-knee  LOWER EXTREMITY ROM:  Active ROM Right eval Left eval Rt 07/20/23 Rt 07/22/23 RT 07/27/23 Rt 07/29/23 08/03/23 Rt 08/06/23  Hip flexion          Hip extension          Hip abduction          Hip adduction          Hip internal rotation          Hip external rotation          Knee flexion 50  70 AA 80 AA75 AA85 AA 72 AA85  Knee extension 30  30 30 23  25d  30  Ankle dorsiflexion  Ankle plantarflexion          Ankle inversion          Ankle eversion           (Blank rows = not tested) 08/11/23:30-85d. 08/19/23=AAROM 30-90d. 08/31/23=AA90d   09/02/23=AA 30-85d. 09/08/23=AA30-90. 09/14/23=AA34-80: 10/01/23: 28-90 AA (end of session seated). 10/05/23: 28-90 AA (end of session supine). 10/12/23: 33d AA (end of session supine). 10/12/23: 33d AA (end of session supine). 10/15/23: 28d AA (end of session supine); 10/19/23:86-28d AA (end of session supine). 10/22/23:86-28d AA (end of session supine). 10/26/23: ext= 28AA. 11/02/23: pre PT 33d; post PT 27dAA; post PT ext ROM= -25d. 11/09/23=post PT ext AAROM= -25d.  11/23/23=post PT ext AAROM= -25d.   LOWER EXTREMITY MMT:  MMT Right eval Left eval RT 07/27/23 Rt 09/02/23  Hip flexion 2  3 with limited ROM 4+  Hip extension 2   4+  Hip abduction 2   4+  Hip adduction      Hip internal rotation      Hip external rotation 2   4+  Knee flexion 2   4+ with limited ROM  Knee extension 2  3 with limited ROM 4+ with limited  ROM  Ankle dorsiflexion      Ankle plantarflexion      Ankle inversion      Ankle eversion       (Blank rows = not tested)  LOWER EXTREMITY SPECIAL TESTS:  NT  FUNCTIONAL TESTS:  5 times sit to stand: TBA 2 minute walk test: TBA  5xSTS=24.7 c arm rest  2MWT=78' c RW GAIT: Distance walked: 33ft Assistive device utilized: Environmental consultant - 2 wheeled Level of assistance: SBA Comments: Antalgic over the R LE  TRANSFER: Mod A for mat table transfer c A for the R LE  TODAY'S TREATMENT:  [facility] Adult PT Treatment:                                                DATE: 11/26/23 Therapeutic Exercise: *** Manual Therapy: *** Neuromuscular re-ed: *** Therapeutic Activity: *** Modalities: *** Self Care: ***  [facility] Adult PT Treatment:                                                DATE: 11/23/23 Manual Therapy: A/P and P/A fem/tib grade 4 mobs  Contact/relax stretching for knee extension in supine and prone LAD R LE Grade 4 patella mobs all directions with emphasis on superior Therapeutic Activity: Nustep 8 mins L5 UE/LE  [facility] Adult PT Treatment:                                                DATE: 11/09/23 Manual Therapy: A/P and P/A fem/tib grade 4 mobs  Contact/relax stretching for knee extension in prone LAD R LE Grade 4 patella mobs all directions with emphasis on superior Therapeutic Activity: Nustep 8 mins L5 UE/LE  [facility] Adult PT Treatment:  DATE: 11/04/23 Manual Therapy: A/P and P/A fem/tib grade 4 mobs  Contact/relax stretching for knee extension in prone LAD R LE Grade 4 patella mobs all directions with emphasis on sup Therapeutic Activity: Nustep 8 mins L5 UE/LE  PATIENT EDUCATION:  Education details: Eval findings, POC, HEP, self care  Person educated: Patient Education method: Explanation, Demonstration, Tactile cues, Verbal cues, and Handouts Education comprehension: verbalized understanding, returned demonstration,  verbal cues required, and tactile cues required  HOME EXERCISE PROGRAM: Access Code: [email or web address] URL: [email or web address] Date: 08/19/2023 Prepared by: Joellyn Rued  Exercises - Long Sitting Quad Set (Mirrored)  - 3 x daily - 7 x weekly - 1 sets - 10 reps - 5 hold - Supine Heel Slide with Strap  - 3 x daily - 7 x weekly - 1 sets - 10 reps - 10 hold - Supine Knee Extension Strengthening (Mirrored)  - 3 x daily - 7 x weekly - 1 sets - 10 reps - 3 hold - Supine Hip Abduction AROM (Mirrored)  - 3 x daily - 7 x weekly - 1 sets - 10 reps - 3 hold - Seated Knee Flexion Slide  - 3 x daily - 7 x weekly - 1 sets - 10 reps - 10 hold - Active Straight Leg Raise with Quad Set  - 3 x daily - 7 x weekly - 1 sets - 10 reps - Seated Knee Extension Stretch with Chair  - 3 x daily - 7 x weekly - 1 sets  ASSESSMENT:  CLINICAL IMPRESSION: 11/23/23: Pt returns to PT after 2 weeks due to being sick. Continued PT to increase R knee extension ROM. At end of session R knee AAROM measured -25d. Gains in extension ROM have been minimal. Will continue PT to address ext ROM, but will also incorporate strengthening, function, and R knee flexion ROM into the treatment sessions..  OBJECTIVE IMPAIRMENTS: decreased activity tolerance, decreased mobility, difficulty walking, decreased ROM, decreased strength, increased edema, obesity, and pain.   ACTIVITY LIMITATIONS: carrying, lifting, bending, sitting, standing, squatting, sleeping, stairs, transfers, bed mobility, bathing, toileting, dressing, locomotion level, and caring for others  PARTICIPATION LIMITATIONS: meal prep, cleaning, laundry, and driving  PERSONAL FACTORS: Age, Fitness, Past/current experiences, Time since onset of injury/illness/exacerbation, and 1 comorbidity: high BMI  are also affecting patient's functional outcome.   REHAB POTENTIAL: Good  CLINICAL DECISION MAKING: Stable/uncomplicated  EVALUATION COMPLEXITY: Low   GOALS:  SHORT  TERM GOALS: Target date: 08/06/23 Pt will be Ind in an initial HEP  Baseline: started Goal status: MET  2.  Pt will be able to complete a SLR and SAQ with or without quad lag Baseline: not able to complete 07/29/23: able to complete both with limited knee ext ROM present Goal status: MET  3.  Increase R knee AROM to 15-80d for improved R knee function Baseline: 30-50 10/01/23: AAROM 28-90 Goal status: PARTIALLY MET FOR FLEXION  LONG TERM GOALS: Target date: 10/22/23  Pt will be Ind in a final HEP to maintain achieved LOF Baseline: started Goal status: ONGOING  2.  Increase R hip and knee strength to 4/5 for improved function use of the R LE Baseline: see flow sheets 09/02/23: see flow sheets Goal status: MET  3.  Increased R knee AROM to 5-115d for appropriate fuctional mobility Baseline: 30-50d 09/02/23: 30-85d 10/22/23: 28-86d 11/04/23:  25d lacking Goal status: NOT MET  4.  Improve 5xSTS by MCID of 5" and by MCID of 13ft as indication of improved functional mobility  Baseline: TBA when pt has progressed to gait c a [facility] 08/31/23: 5xSTS=24.7 c arm rest      2MWT=78' c RW 10/15/23:  5xSTS=11.8 s arm rest      2MWT= 185' c RW Goal status: MET  5.  Pt's FOTO score will improved to the predicted value of 43% as indication of improved function  Baseline: 22% 10/22/23:48% Goal status: MET  6.  Pt will be able to walk 424ft c or s a SPC and ascend/descend 12 steps c or s a SPC and c use of a HR for community mobility Baseline: 54ft c RW 10/22/23: 384ft  Goal status: ONGOING   PLAN:  PT FREQUENCY: 2x/week  PT DURATION: 6 weeks  PLANNED INTERVENTIONS: 97164- PT Re-evaluation, 97110-Therapeutic exercises, 97530- Therapeutic activity, 97535- Self Care, 16109- Manual therapy, [id number]- Gait training, 97014- Electrical stimulation (unattended), [id number]- Electrical stimulation (manual), 97016- Vasopneumatic device, Stair training, Dry Needling, Joint mobilization, Cryotherapy, and  Moist heat  PLAN FOR NEXT SESSION: Review FOTO; assess response to HEP; progress therex as indicated; use of modalities, manual therapy; and TPDN as indicated.   Orlan Aversa MS, PT 11/25/23 9:04 PM

## 2023-11-25 NOTE — Progress Notes (Addendum)
 Next Prolia SQ due on 12/07/2023. Diagnosis: age-related osteoporosis  Dose: 60 mg SQ every 6 months  Last Clinic Visit: 03/16/2023 Next Clinic Visit: 12/01/2023  Last Prolia dose: 06/10/2023  Labs: CBC/CMP on 11/25/2023 Last DEXA: 09/03/2022 Next DEXA due: Dec 2025  Orders placed for Prolia x 1 dose. No premedicatons required.   Called patient and provided with phone number for: [facility] [phone]) Ginette Otto  Will follow-up to ensured scheduled and completed.  Chesley Mires, PharmD, MPH, BCPS, CPP Clinical Pharmacist (Rheumatology and Pulmonology)

## 2023-11-25 NOTE — Telephone Encounter (Signed)
 Returned call to patient about Prolia. Unable to reach. Left VM  Per test claim through pharmacy benefit, copay is $930.81  Authorization through medical benefit for Prolia remains active. Likely will be more cost-effective for her to receive Prolia at infusion center again  Chesley Mires, PharmD, MPH, BCPS, CPP Clinical Pharmacist (Rheumatology and Pulmonology)

## 2023-11-26 ENCOUNTER — Other Ambulatory Visit: Payer: Self-pay

## 2023-11-26 ENCOUNTER — Ambulatory Visit: Payer: Medicare HMO

## 2023-11-26 DIAGNOSIS — M6281 Muscle weakness (generalized): Secondary | ICD-10-CM | POA: Diagnosis not present

## 2023-11-26 DIAGNOSIS — M25561 Pain in right knee: Secondary | ICD-10-CM | POA: Diagnosis not present

## 2023-11-26 DIAGNOSIS — G8929 Other chronic pain: Secondary | ICD-10-CM

## 2023-11-26 DIAGNOSIS — R262 Difficulty in walking, not elsewhere classified: Secondary | ICD-10-CM | POA: Diagnosis not present

## 2023-11-26 DIAGNOSIS — M24661 Ankylosis, right knee: Secondary | ICD-10-CM | POA: Diagnosis not present

## 2023-11-26 LAB — KAPPA/LAMBDA LIGHT CHAINS
Kappa free light chain: 39.4 mg/L — ABNORMAL HIGH (ref 3.3–19.4)
Kappa, lambda light chain ratio: 0.15 — ABNORMAL LOW (ref 0.26–1.65)
Lambda free light chains: 257.8 mg/L — ABNORMAL HIGH (ref 5.7–26.3)

## 2023-11-29 LAB — MULTIPLE MYELOMA PANEL, SERUM
Albumin SerPl Elph-Mcnc: 3.7 g/dL (ref 2.9–4.4)
Albumin/Glob SerPl: 1.4 (ref 0.7–1.7)
Alpha 1: 0.2 g/dL (ref 0.0–0.4)
Alpha2 Glob SerPl Elph-Mcnc: 0.8 g/dL (ref 0.4–1.0)
B-Globulin SerPl Elph-Mcnc: 0.8 g/dL (ref 0.7–1.3)
Gamma Glob SerPl Elph-Mcnc: 0.8 g/dL (ref 0.4–1.8)
Globulin, Total: 2.7 g/dL (ref 2.2–3.9)
IgA: 145 mg/dL (ref 64–422)
IgG (Immunoglobin G), Serum: 879 mg/dL (ref 586–1602)
IgM (Immunoglobulin M), Srm: 46 mg/dL (ref 26–217)
Total Protein ELP: 6.4 g/dL (ref 6.0–8.5)

## 2023-11-30 NOTE — Therapy (Signed)
 OUTPATIENT PHYSICAL THERAPY LOWER EXTREMITY TREATMENT   Patient Name: Hannah [name] MRN: [id number] DOB:1946-03-29, 78 y.o., female Today's Date:    END OF SESSION:             Past Medical History:  Diagnosis Date   Allergy 25 years ago.   Anemia    Arthritis    Asthma    Atrial fibrillation (HCC)    Cataract Had cataracts surgery 2015 & 2016   Chronic kidney disease    Diabetes mellitus without complication (HCC)    Gout 12/17/2014   patient reported   Heart murmur 4 years ago   Hyperlipidemia    Hypertension    Hypothyroidism    Systemic lupus erythematosus (HCC)    Vitiligo    Past Surgical History:  Procedure Laterality Date   BIOPSY  09/26/2023   Procedure: BIOPSY;  Surgeon: Meryl Dare, MD;  Location: [facility];  Service: Gastroenterology;;   CATARACT EXTRACTION Bilateral 2015   COLONOSCOPY WITH PROPOFOL N/A 09/27/2023   Procedure: COLONOSCOPY WITH PROPOFOL;  Surgeon: [name], MD;  Location: [facility];  Service: Gastroenterology;  Laterality: N/A;   DILATION AND CURETTAGE OF UTERUS     DOPPLER ECHOCARDIOGRAPHY  05/2018   Internist to review with pt; potential heart murmur 06/20/18   ESOPHAGOGASTRODUODENOSCOPY (EGD) WITH PROPOFOL N/A 09/26/2023   Procedure: ESOPHAGOGASTRODUODENOSCOPY (EGD) WITH PROPOFOL;  Surgeon: Meryl Dare, MD;  Location: [facility];  Service: Gastroenterology;  Laterality: N/A;   EYE SURGERY  Cataract Surgery, 2015 & 2016.   keratosis removal  2021   KNEE CLOSED REDUCTION Left 07/06/2022   Procedure: CLOSED MANIPULATION KNEE;  Surgeon: Ollen Gross, MD;  Location: [facility];  Service: Orthopedics;  Laterality: Left;   KNEE CLOSED REDUCTION Right 09/13/2023   Procedure: CLOSED MANIPULATION KNEE;  Surgeon: Ollen Gross, MD;  Location: [facility];  Service: Orthopedics;  Laterality: Right;   REPLACEMENT TOTAL KNEE Left    SKIN SURGERY  11/30/2018   left side of face   TOOTH EXTRACTION     TOTAL KNEE  ARTHROPLASTY Left 02/02/2022   Procedure: TOTAL KNEE ARTHROPLASTY;  Surgeon: Ollen Gross, MD;  Location: [facility];  Service: Orthopedics;  Laterality: Left;   TOTAL KNEE ARTHROPLASTY Right 07/12/2023   Procedure: RIGHT TOTAL KNEE ARTHROPLASTY;  Surgeon: Ollen Gross, MD;  Location: [facility];  Service: Orthopedics;  Laterality: Right;   Patient Active Problem List   Diagnosis Date Noted   Class 1 obesity due to excess calories with serious comorbidity and body mass index (BMI) of 32.0 to 32.9 in adult 10/17/2023   Chronic superficial gastritis with bleeding 10/17/2023   Occult blood detected in feces by immunoassay 09/26/2023   ABLA (acute blood loss anemia) 09/23/2023   GI bleed 09/23/2023   Pure hypercholesterolemia 07/18/2023   Personal history of COVID-19 07/18/2023   Primary osteoarthritis of right knee 07/12/2023   Acquired thrombophilia (HCC) 01/12/2023   Arthrofibrosis of knee joint, right 07/06/2022   OA (osteoarthritis) of knee 02/02/2022   Primary osteoarthritis of left knee 02/02/2022   Chronic bilateral low back pain without sciatica 06/25/2021   Paresthesia and pain of extremity 06/25/2021   Atrial fibrillation (HCC) 04/16/2021   Demand ischemia (HCC) 03/26/2021   Unspecified atrial fibrillation (HCC) 03/26/2021   COVID-19 virus infection 03/26/2021   PVC (premature ventricular contraction) 05/06/2020   Type 2 diabetes mellitus with stage 3 chronic kidney disease, without long-term current use of insulin (HCC) 03/20/2020   Palpitations 03/20/2020   Hypertensive heart  and renal disease 03/20/2020   Primary hypothyroidism 03/20/2020   Vitiligo 03/20/2020   Iron deficiency anemia 08/12/2017   Osteoporosis 10/02/2016   Systemic lupus erythematosus (HCC) 10/02/2016   Rheumatoid arthritis with rheumatoid factor of multiple sites without organ or systems involvement (HCC) 10/02/2016   High risk medication use 10/02/2016   History of chronic kidney disease 10/02/2016    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/2016   Primary osteoarthritis of both knees 10/02/2016   Vitamin D deficiency 10/02/2016   History of diabetes mellitus 10/02/2016   History of hypertension 10/02/2016   History of asthma 10/02/2016   Absolute anemia    MGUS (monoclonal gammopathy of unknown significance)    Normocytic anemia 03/21/2015   Essential hypertension 03/20/2015   Abnormal CT of the chest 07/30/2012   Asthma 06/19/2012    PCP: Dorothyann Peng, MD  REFERRING PROVIDER: Ollen Gross, MD   REFERRING DIAG: M17.11 (ICD-10-CM) - Unilateral primary osteoarthritis, right knee   THERAPY DIAG:  No diagnosis found.  Rationale for Evaluation and Treatment: Rehabilitation  ONSET DATE: 07/12/23  SUBJECTIVE:   SUBJECTIVE STATEMENT: 11/26/23: Pt reports her R knee is the same. No new concerns.  EVAL: Pt reports she has been completing her HEP 1x daily and using cold packs on a frequent basis for pain and swelling management.  PAIN:  Are you having pain? Yes: NPRS scale: 4/10 intermittent; pain range 5-10/10 Pain location: R knee Pain description: ache ,throb ,sharp Aggravating factors: movement, lifting, wt bearing Relieving factors: Pain medication, muscle relaxer, cold packs  PERTINENT HISTORY: High BMI, L LKA, Afib  PRECAUTIONS: Knee  RED FLAGS: None   WEIGHT BEARING RESTRICTIONS: No  FALLS:  Has patient fallen in last 6 months? No  LIVING ENVIRONMENT: Lives with: lives alone Lives in: House/apartment Stairs: No Has following equipment at home: Single point cane, Environmental consultant - 2 wheeled, Wheelchair (manual), and bed side commode  OCCUPATION: Retired  PLOF: Independent with household mobility with device  PATIENT GOALS: good use of my R knee  NEXT MD VISIT: 07/27/23  OBJECTIVE:  Note: Objective measures were completed at Evaluation unless otherwise noted.  DIAGNOSTIC FINDINGS: None for the R knee in Epic  PATIENT SURVEYS:   FOTO: Perceived function   22%, predicted   43%   COGNITION: Overall cognitive status: Within functional limits for tasks assessed     SENSATION: WFL  EDEMA:  Significant R knee edema present   MUSCLE LENGTH: Hamstrings: Right NT deg; Left NT deg Thomas test: Right NT deg; Left NT deg  POSTURE: rounded shoulders, forward head, and anterior pelvic tilt  PALPATION: TTP to the R peri-knee  LOWER EXTREMITY ROM:  Active ROM Right eval Left eval Rt 07/20/23 Rt 07/22/23 RT 07/27/23 Rt 07/29/23 08/03/23 Rt 08/06/23  Hip flexion          Hip extension          Hip abduction          Hip adduction          Hip internal rotation          Hip external rotation          Knee flexion 50  70 AA 80 AA75 AA85 AA 72 AA85  Knee extension 30  30 30 23  25d  30  Ankle dorsiflexion          Ankle plantarflexion          Ankle inversion  Ankle eversion           (Blank rows = not tested) 08/11/23:30-85d. 08/19/23=AAROM 30-90d. 08/31/23=AA90d   09/02/23=AA 30-85d. 09/08/23=AA30-90. 09/14/23=AA34-80: 10/01/23: 28-90 AA (end of session seated). 10/05/23: 28-90 AA (end of session supine). 10/12/23: 33d AA (end of session supine). 10/12/23: 33d AA (end of session supine). 10/15/23: 28d AA (end of session supine); 10/19/23:86-28d AA (end of session supine). 10/22/23:86-28d AA (end of session supine). 10/26/23: ext= 28AA. 11/02/23: pre PT 33d; post PT 27dAA; post PT ext ROM= -25d. 11/09/23=post PT ext AAROM= -25d.  11/23/23=post PT ext AAROM= -25d. 11/26/23=post PT ext AAROM= -25-90d.   LOWER EXTREMITY MMT:  MMT Right eval Left eval RT 07/27/23 Rt 09/02/23  Hip flexion 2  3 with limited ROM 4+  Hip extension 2   4+  Hip abduction 2   4+  Hip adduction      Hip internal rotation      Hip external rotation 2   4+  Knee flexion 2   4+ with limited ROM  Knee extension 2  3 with limited ROM 4+ with limited ROM  Ankle dorsiflexion      Ankle plantarflexion      Ankle inversion      Ankle eversion        (Blank rows = not tested)  LOWER EXTREMITY SPECIAL TESTS:  NT  FUNCTIONAL TESTS:  5 times sit to stand: TBA 2 minute walk test: TBA  5xSTS=24.7 c arm rest  2MWT=78' c RW GAIT: Distance walked: 65ft Assistive device utilized: Environmental consultant - 2 wheeled Level of assistance: SBA Comments: Antalgic over the R LE  TRANSFER: Mod A for mat table transfer c A for the R LE  TODAY'S TREATMENT:  [facility] Adult PT Treatment:                                                DATE: 12/01/22 Manual Therapy: A/P and P/A fem/tib grade 4 mobs  Contact/relax stretching for knee extension in supine and prone Grade 4 patella mobs all directions with emphasis on superior Therapeutic Activity: Nustep 7 mins L5 UE/LE Gait training with SPC 150"- more labored after approx 75' Lateral step ups/downs 6" c counter assist Therapeutic Exercise: Seated knee flexion c pt over pressure x3 30" Standing step knee flexion stretch x3 30" LAQ 2x15 5# AAROM for knee flexion and ext c strap and slide disc x10 Therapeutic Exercise: *** Manual Therapy: *** Neuromuscular re-ed: *** Therapeutic Activity: *** Modalities: *** Self Care: ***  [facility] Adult PT Treatment:                                                DATE: 11/26/23 Manual Therapy: A/P and P/A fem/tib grade 4 mobs  Contact/relax stretching for knee extension in supine and prone Grade 4 patella mobs all directions with emphasis on superior Therapeutic Activity: Nustep 7 mins L5 UE/LE Gait training with SPC 150"- more labored after approx 75' Lateral step ups/downs 6" c counter assist Therapeutic Exercise: Seated knee flexion c pt over pressure x3 30" Standing step knee flexion stretch x3 30" LAQ 2x15 5# AAROM for knee flexion and ext c strap and slide disc x10  [facility] Adult PT Treatment:  DATE: 11/23/23 Manual Therapy: A/P and P/A fem/tib grade 4 mobs  Contact/relax stretching for knee extension in supine and  prone LAD R LE Grade 4 patella mobs all directions with emphasis on superior Therapeutic Activity: Nustep 8 mins L5 UE/LE   PATIENT EDUCATION:  Education details: Eval findings, POC, HEP, self care  Person educated: Patient Education method: Explanation, Demonstration, Tactile cues, Verbal cues, and Handouts Education comprehension: verbalized understanding, returned demonstration, verbal cues required, and tactile cues required  HOME EXERCISE PROGRAM: Access Code: [email or web address] URL: [email or web address] Date: 08/19/2023 Prepared by: Joellyn Rued  Exercises - Long Sitting Quad Set (Mirrored)  - 3 x daily - 7 x weekly - 1 sets - 10 reps - 5 hold - Supine Heel Slide with Strap  - 3 x daily - 7 x weekly - 1 sets - 10 reps - 10 hold - Supine Knee Extension Strengthening (Mirrored)  - 3 x daily - 7 x weekly - 1 sets - 10 reps - 3 hold - Supine Hip Abduction AROM (Mirrored)  - 3 x daily - 7 x weekly - 1 sets - 10 reps - 3 hold - Seated Knee Flexion Slide  - 3 x daily - 7 x weekly - 1 sets - 10 reps - 10 hold - Active Straight Leg Raise with Quad Set  - 3 x daily - 7 x weekly - 1 sets - 10 reps - Seated Knee Extension Stretch with Chair  - 3 x daily - 7 x weekly - 1 sets  ASSESSMENT:  CLINICAL IMPRESSION: 11/26/23: PT was completed for R knee/LE strengthening, ROM, and gait training. AAROM is min improved with ext to -25d. Pt demonstrated appropriate control of the R knee c the [facility] for a short distance, the r knee then became fatigued at approx 75' and pt's effort was more labored. Pt is going to bring in her own [facility] to practice. Will assess function goals next week. Pt tolerated PT today without adverse effects. Pt will continue to benefit from skilled PT to address impairments for improved R knee function.    OBJECTIVE IMPAIRMENTS: decreased activity tolerance, decreased mobility, difficulty walking, decreased ROM, decreased strength, increased edema, obesity, and pain.    ACTIVITY LIMITATIONS: carrying, lifting, bending, sitting, standing, squatting, sleeping, stairs, transfers, bed mobility, bathing, toileting, dressing, locomotion level, and caring for others  PARTICIPATION LIMITATIONS: meal prep, cleaning, laundry, and driving  PERSONAL FACTORS: Age, Fitness, Past/current experiences, Time since onset of injury/illness/exacerbation, and 1 comorbidity: high BMI  are also affecting patient's functional outcome.   REHAB POTENTIAL: Good  CLINICAL DECISION MAKING: Stable/uncomplicated  EVALUATION COMPLEXITY: Low   GOALS:  SHORT TERM GOALS: Target date: 08/06/23 Pt will be Ind in an initial HEP  Baseline: started Goal status: MET  2.  Pt will be able to complete a SLR and SAQ with or without quad lag Baseline: not able to complete 07/29/23: able to complete both with limited knee ext ROM present Goal status: MET  3.  Increase R knee AROM to 15-80d for improved R knee function Baseline: 30-50 10/01/23: AAROM 28-90 Goal status: PARTIALLY MET FOR FLEXION  LONG TERM GOALS: Target date: 10/22/23  Pt will be Ind in a final HEP to maintain achieved LOF Baseline: started Goal status: ONGOING  2.  Increase R hip and knee strength to 4/5 for improved function use of the R LE Baseline: see flow sheets 09/02/23: see flow sheets Goal status: MET  3.  Increased R knee AROM to 5-115d for appropriate  fuctional mobility Baseline: 30-50d 09/02/23: 30-85d 10/22/23: 28-86d 11/04/23:  25d lacking Goal status: NOT MET  4.  Improve 5xSTS by MCID of 5" and by MCID of 59ft as indication of improved functional mobility  Baseline: TBA when pt has progressed to gait c a [facility] 08/31/23: 5xSTS=24.7 c arm rest      2MWT=78' c RW 10/15/23:  5xSTS=11.8 s arm rest      2MWT= 185' c RW Goal status: MET  5.  Pt's FOTO score will improved to the predicted value of 43% as indication of improved function  Baseline: 22% 10/22/23:48% Goal status: MET  6.  Pt will be able  to walk 438ft c or s a SPC and ascend/descend 12 steps c or s a SPC and c use of a HR for community mobility Baseline: 1ft c RW 10/22/23: 385ft  Goal status: ONGOING   PLAN:  PT FREQUENCY: 2x/week  PT DURATION: 6 weeks  PLANNED INTERVENTIONS: 97164- PT Re-evaluation, 97110-Therapeutic exercises, 97530- Therapeutic activity, 97535- Self Care, 16109- Manual therapy, [id number]- Gait training, 97014- Electrical stimulation (unattended), [id number]- Electrical stimulation (manual), 97016- Vasopneumatic device, Stair training, Dry Needling, Joint mobilization, Cryotherapy, and Moist heat  PLAN FOR NEXT SESSION: Review FOTO; assess response to HEP; progress therex as indicated; use of modalities, manual therapy; and TPDN as indicated.   Nakisha Chai MS, PT 11/30/23 9:02 AM

## 2023-12-01 ENCOUNTER — Encounter: Payer: Self-pay | Admitting: Rheumatology

## 2023-12-01 ENCOUNTER — Ambulatory Visit: Payer: Medicare HMO | Attending: Rheumatology | Admitting: Rheumatology

## 2023-12-01 VITALS — BP 149/83 | HR 68 | Resp 13 | Ht 59.0 in | Wt 153.4 lb

## 2023-12-01 DIAGNOSIS — Z7901 Long term (current) use of anticoagulants: Secondary | ICD-10-CM

## 2023-12-01 DIAGNOSIS — M0579 Rheumatoid arthritis with rheumatoid factor of multiple sites without organ or systems involvement: Secondary | ICD-10-CM

## 2023-12-01 DIAGNOSIS — D472 Monoclonal gammopathy: Secondary | ICD-10-CM

## 2023-12-01 DIAGNOSIS — M1A09X0 Idiopathic chronic gout, multiple sites, without tophus (tophi): Secondary | ICD-10-CM | POA: Diagnosis not present

## 2023-12-01 DIAGNOSIS — M1711 Unilateral primary osteoarthritis, right knee: Secondary | ICD-10-CM

## 2023-12-01 DIAGNOSIS — Z8679 Personal history of other diseases of the circulatory system: Secondary | ICD-10-CM

## 2023-12-01 DIAGNOSIS — E559 Vitamin D deficiency, unspecified: Secondary | ICD-10-CM | POA: Diagnosis not present

## 2023-12-01 DIAGNOSIS — M3219 Other organ or system involvement in systemic lupus erythematosus: Secondary | ICD-10-CM

## 2023-12-01 DIAGNOSIS — Z96652 Presence of left artificial knee joint: Secondary | ICD-10-CM | POA: Diagnosis not present

## 2023-12-01 DIAGNOSIS — I480 Paroxysmal atrial fibrillation: Secondary | ICD-10-CM

## 2023-12-01 DIAGNOSIS — Z8709 Personal history of other diseases of the respiratory system: Secondary | ICD-10-CM

## 2023-12-01 DIAGNOSIS — Z8639 Personal history of other endocrine, nutritional and metabolic disease: Secondary | ICD-10-CM

## 2023-12-01 DIAGNOSIS — Z79899 Other long term (current) drug therapy: Secondary | ICD-10-CM | POA: Diagnosis not present

## 2023-12-01 DIAGNOSIS — Z96651 Presence of right artificial knee joint: Secondary | ICD-10-CM | POA: Diagnosis not present

## 2023-12-01 DIAGNOSIS — M810 Age-related osteoporosis without current pathological fracture: Secondary | ICD-10-CM

## 2023-12-01 DIAGNOSIS — Z87448 Personal history of other diseases of urinary system: Secondary | ICD-10-CM

## 2023-12-01 DIAGNOSIS — Z862 Personal history of diseases of the blood and blood-forming organs and certain disorders involving the immune mechanism: Secondary | ICD-10-CM

## 2023-12-01 NOTE — Patient Instructions (Signed)
 Vaccines You are taking a medication(s) that can suppress your immune system.  The following immunizations are recommended: Flu annually Covid-19  RSV Td/Tdap (tetanus, diphtheria, pertussis) every 10 years Pneumonia (Prevnar 15 then Pneumovax 23 at least 1 year apart.  Alternatively, can take Prevnar 20 without needing additional dose) Shingrix: 2 doses from 4 weeks to 6 months apart  Please check with your PCP to make sure you are up to date.

## 2023-12-01 NOTE — Progress Notes (Signed)
 Prolia scheduled for 12/15/23

## 2023-12-02 ENCOUNTER — Ambulatory Visit: Payer: Medicare HMO | Attending: Internal Medicine

## 2023-12-02 DIAGNOSIS — M6281 Muscle weakness (generalized): Secondary | ICD-10-CM | POA: Diagnosis not present

## 2023-12-02 DIAGNOSIS — G8929 Other chronic pain: Secondary | ICD-10-CM | POA: Insufficient documentation

## 2023-12-02 DIAGNOSIS — R262 Difficulty in walking, not elsewhere classified: Secondary | ICD-10-CM | POA: Insufficient documentation

## 2023-12-02 DIAGNOSIS — M25561 Pain in right knee: Secondary | ICD-10-CM | POA: Diagnosis not present

## 2023-12-02 NOTE — Progress Notes (Signed)
 Hemoglobin remains low and stable.  Protein creatinine ratio is mildly elevated and stable.  Creatinine is mildly elevated and stable.  Complements are normal, sed rate is normal, vitamin D is normal uric acid is normal double-stranded DNA is pending.  Please forward results to her PCP and nephrologist.

## 2023-12-03 ENCOUNTER — Inpatient Hospital Stay: Payer: Medicare HMO | Attending: Hematology | Admitting: Hematology

## 2023-12-03 VITALS — BP 139/63 | HR 70 | Temp 97.5°F | Resp 17 | Ht 59.0 in | Wt 153.9 lb

## 2023-12-03 DIAGNOSIS — D472 Monoclonal gammopathy: Secondary | ICD-10-CM | POA: Insufficient documentation

## 2023-12-03 DIAGNOSIS — D508 Other iron deficiency anemias: Secondary | ICD-10-CM

## 2023-12-03 DIAGNOSIS — Z79899 Other long term (current) drug therapy: Secondary | ICD-10-CM | POA: Diagnosis not present

## 2023-12-03 DIAGNOSIS — D649 Anemia, unspecified: Secondary | ICD-10-CM | POA: Insufficient documentation

## 2023-12-03 LAB — C3 AND C4
C3 Complement: 127 mg/dL (ref 83–193)
C4 Complement: 36 mg/dL (ref 15–57)

## 2023-12-03 LAB — COMPLETE METABOLIC PANEL WITH GFR
AG Ratio: 1.8 (calc) (ref 1.0–2.5)
ALT: 11 U/L (ref 6–29)
AST: 18 U/L (ref 10–35)
Albumin: 4.2 g/dL (ref 3.6–5.1)
Alkaline phosphatase (APISO): 60 U/L (ref 37–153)
BUN/Creatinine Ratio: 14 (calc) (ref 6–22)
BUN: 17 mg/dL (ref 7–25)
CO2: 29 mmol/L (ref 20–32)
Calcium: 9.8 mg/dL (ref 8.6–10.4)
Chloride: 107 mmol/L (ref 98–110)
Creat: 1.24 mg/dL — ABNORMAL HIGH (ref 0.60–1.00)
Globulin: 2.3 g/dL (ref 1.9–3.7)
Glucose, Bld: 82 mg/dL (ref 65–99)
Potassium: 4.7 mmol/L (ref 3.5–5.3)
Sodium: 143 mmol/L (ref 135–146)
Total Bilirubin: 0.5 mg/dL (ref 0.2–1.2)
Total Protein: 6.5 g/dL (ref 6.1–8.1)
eGFR: 45 mL/min/{1.73_m2} — ABNORMAL LOW (ref >=60)

## 2023-12-03 LAB — CBC WITH DIFFERENTIAL/PLATELET
Absolute Lymphocytes: 1036 {cells}/uL (ref 850–3900)
Absolute Monocytes: 476 {cells}/uL (ref 200–950)
Basophils Absolute: 39 {cells}/uL (ref 0–200)
Basophils Relative: 0.7 %
Eosinophils Absolute: 442 {cells}/uL (ref 15–500)
Eosinophils Relative: 7.9 %
HCT: 30.6 % — ABNORMAL LOW (ref 35.0–45.0)
Hemoglobin: 10 g/dL — ABNORMAL LOW (ref 11.7–15.5)
MCH: 30.8 pg (ref 27.0–33.0)
MCHC: 32.7 g/dL (ref 32.0–36.0)
MCV: 94.2 fL (ref 80.0–100.0)
MPV: 11.6 fL (ref 7.5–12.5)
Monocytes Relative: 8.5 %
Neutro Abs: 3606 {cells}/uL (ref 1500–7800)
Neutrophils Relative %: 64.4 %
Platelets: 218 10*3/uL (ref 140–400)
RBC: 3.25 10*6/uL — ABNORMAL LOW (ref 3.80–5.10)
RDW: 13.9 % (ref 11.0–15.0)
Total Lymphocyte: 18.5 %
WBC: 5.6 10*3/uL (ref 3.8–10.8)

## 2023-12-03 LAB — SEDIMENTATION RATE: Sed Rate: 22 mm/h (ref 0–30)

## 2023-12-03 LAB — PROTEIN / CREATININE RATIO, URINE
Creatinine, Urine: 115 mg/dL (ref 20–275)
Protein/Creat Ratio: 235 mg/g{creat} — ABNORMAL HIGH (ref 24–184)
Protein/Creatinine Ratio: 0.235 mg/mg{creat} — ABNORMAL HIGH (ref 0.024–0.184)
Total Protein, Urine: 27 mg/dL — ABNORMAL HIGH (ref 5–24)

## 2023-12-03 LAB — URIC ACID: Uric Acid, Serum: 5.2 mg/dL (ref 2.5–7.0)

## 2023-12-03 LAB — ANTI-DNA ANTIBODY, DOUBLE-STRANDED: ds DNA Ab: 66 [IU]/mL — ABNORMAL HIGH

## 2023-12-03 LAB — VITAMIN D 25 HYDROXY (VIT D DEFICIENCY, FRACTURES): Vit D, 25-Hydroxy: 55 ng/mL (ref 30–100)

## 2023-12-03 NOTE — Progress Notes (Signed)
 HEMATOLOGY ONCOLOGY PROGRESS NOTE  Date of service: 12/03/23    Patient Care Team: Dorothyann Peng, MD as PCP - General (Internal Medicine) Nahser, Deloris Ping, MD as PCP - Cardiology (Cardiology) [facility]  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 Neer is here for followup for her MGUS and anemia. Patient was last seen by me on 05/17/2023 and complained of fatigue and severe lower back pain 1-2 weeks prior which had improved.  Today, She presents with a walker. She reports having knee replacement surgery on October 14, which is "not straightening out". She continues to engage in physical therapy. Patient uses a knee brace daily for 30 mins 3x day to help straighten the area. She reports that she uses a cane sometimes depending on where she is going. Patient will follow up with Dr. Lequita Halt, her orthopedic surgeon on May 3rd.   Patient was hospitalized December 26-31 for GI bleeding/gastritis/ulcer. She reports that she did not notice having GI bleeding issues at that time. She notes that she required 2 blood transfusions in hospital. Patient started acid suppressants and has not had bleeding issues since.   She reports that Dr. Loreta Ave wanted her to have blood testing after her stool sample testing. She was found to have hgb 10.9 and she was told that  her gastroenterologist no longer needed to see her.   Patient last received IV iron 6 months ago prior to her surgery. She reports that she has tolerated IV venofer well with no toxicity issues. Patient is not taking oral iron. She continues to take a vitamin B complex/multivitamin.   She denies any leg swelling.   She reports that she was seen by Dr. Corliss Skains on  12/01/2023 for lupus/RA management and there were no medication changes. She has a 6 month follow up with her rheumatologist.   Patient will follow back with her nephrologist next in four months.   REVIEW OF SYSTEMS:    10 Point review of Systems was done is negative except as noted above.   . Past Medical History:  Diagnosis Date   Allergy 25 years ago.   Anemia    Arthritis    Asthma    Atrial fibrillation (HCC)    Cataract Had cataracts surgery 2015 & 2016   Chronic kidney disease    Diabetes mellitus without complication (HCC)    Gout 12/17/2014   patient reported   Heart murmur 4 years ago   Hyperlipidemia    Hypertension    Hypothyroidism    Systemic lupus erythematosus (HCC)    Vitiligo   possible SLE/rheumatoid arthritis following with Dr.Deveshwar.  . Past Surgical History:  Procedure Laterality Date   BIOPSY  09/26/2023   Procedure: BIOPSY;  Surgeon: Meryl Dare, MD;  Location: [facility];  Service: Gastroenterology;;   CATARACT EXTRACTION Bilateral 2015   COLONOSCOPY WITH PROPOFOL N/A 09/27/2023   Procedure: COLONOSCOPY WITH PROPOFOL;  Surgeon: [name], MD;  Location: [facility];  Service: Gastroenterology;  Laterality: N/A;   DILATION AND CURETTAGE OF UTERUS     DOPPLER ECHOCARDIOGRAPHY  05/2018   Internist to review with pt; potential heart murmur 06/20/18   ESOPHAGOGASTRODUODENOSCOPY (EGD) WITH PROPOFOL N/A 09/26/2023   Procedure: ESOPHAGOGASTRODUODENOSCOPY (EGD) WITH PROPOFOL;  Surgeon: Meryl Dare, MD;  Location: [facility];  Service: Gastroenterology;  Laterality: N/A;   EYE SURGERY  Cataract Surgery, 2015 & 2016.   keratosis removal  2021   KNEE CLOSED REDUCTION Left 07/06/2022   Procedure: CLOSED MANIPULATION KNEE;  Surgeon: Ollen Gross, MD;  Location: [facility];  Service: Orthopedics;  Laterality: Left;   KNEE CLOSED REDUCTION Right 09/13/2023   Procedure: CLOSED MANIPULATION KNEE;  Surgeon: Ollen Gross, MD;  Location: [facility];   Service: Orthopedics;  Laterality: Right;   REPLACEMENT TOTAL KNEE Left    SKIN SURGERY  11/30/2018   left side of face   TOOTH EXTRACTION     TOTAL KNEE ARTHROPLASTY Left 02/02/2022   Procedure: TOTAL KNEE ARTHROPLASTY;  Surgeon: Ollen Gross, MD;  Location: [facility];  Service: Orthopedics;  Laterality: Left;   TOTAL KNEE ARTHROPLASTY Right 07/12/2023   Procedure: RIGHT TOTAL KNEE ARTHROPLASTY;  Surgeon: Ollen Gross, MD;  Location: [facility];  Service: Orthopedics;  Laterality: Right;    . Social History   Tobacco Use   Smoking status: Never    Passive exposure: Never   Smokeless tobacco: Never  Vaping Use   Vaping status: Never Used  Substance Use Topics   Alcohol use: Not Currently    Comment: Maybe a glass of wine every 3 months.   Drug use: No    ALLERGIES:  is allergic to shellfish allergy.  MEDICATIONS:  Current Outpatient Medications  Medication Sig Dispense Refill   Accu-Chek Softclix Lancets lancets Use to check blood sugars daily E11.69 100 each 2   albuterol (PROVENTIL HFA;VENTOLIN HFA) 108 (90 Base) MCG/ACT inhaler Inhale 2 puffs into the lungs every 6 (six) hours as needed for wheezing or shortness of breath. 1 Inhaler 2   Alcohol Swabs (ALCOHOL PADS) 70 % PADS Use as directed to check blood sugars 1 time per day dx: e11.22 150 each 2   allopurinol (ZYLOPRIM) 100 MG tablet Take 1 tablet (100 mg total) by mouth 3 (three) times a week for 90 doses. (Patient taking differently: Take 100 mg by mouth every Monday, Wednesday, and Friday.) 90 tablet 2   amLODipine (NORVASC) 5 MG tablet Take 1 tablet (5 mg total) by mouth at bedtime. 90 tablet 2   apixaban (ELIQUIS) 5 MG TABS tablet Take 1 tablet (5 mg total) by mouth 2 (two) times daily. 180 tablet 1   Ascorbic Acid (VITAMIN C PO) Take 500 mg by mouth daily with lunch.     atorvastatin (LIPITOR) 80 MG tablet TAKE 1 TABLET BY MOUTH EVERY DAY 90 tablet 2   B Complex-C (B-COMPLEX WITH VITAMIN C) tablet Take 1 tablet by  mouth daily with lunch.     budesonide-formoterol (SYMBICORT) 160-4.5 MCG/ACT inhaler INHALE 2 PUFFS BY MOUTH TWICE DAILY IN THE MORNING AND IN THE EVENING (Patient taking differently: Inhale 2 puffs into the lungs 2 (two) times daily as needed (Asthma).) 10.2 g 6   Calcium Carb-Cholecalciferol 600-800 MG-UNIT TABS Take 1 tablet by mouth daily with lunch.     Cholecalciferol (VITAMIN D PO) Take 2,000 Units by mouth daily with lunch.     dapagliflozin propanediol (FARXIGA) 10 MG TABS tablet Take 1 tablet (10 mg total) by mouth daily before breakfast. 90 tablet 3   denosumab (PROLIA) 60 MG/ML SOSY injection Inject 60 mg into the skin every 6 (six) months. Courier to rheum: [address], [address], [place] [address]. Appt on 12/07/2022 1 mL 0   glucose blood (ACCU-CHEK GUIDE) test  strip USE TO CHECK BLOOD SUGAR DAILY 100 strip 2   hydroxychloroquine (PLAQUENIL) 200 MG tablet TAKE 1 TABLET BY MOUTH IN THE MORNING AND 1/2 TABLET IN THE EVENING MONDAY-FRIDAY ONLY 90 tablet 0   irbesartan (AVAPRO) 300 MG tablet TAKE 1 TABLET(300 MG) BY MOUTH DAILY (Patient taking differently: Take 300 mg by mouth daily.) 90 tablet 2   metoprolol succinate (TOPROL-XL) 25 MG 24 hr tablet TAKE 1 TABLET(25 MG) BY MOUTH DAILY (Patient taking differently: Take 25 mg by mouth daily.) 90 tablet 3   Multiple Vitamin (MULTIVITAMIN WITH MINERALS) TABS tablet Take 1 tablet by mouth daily with lunch. Women's Multivitamin     pantoprazole (PROTONIX) 40 MG tablet Take 1 tablet (40 mg total) by mouth 2 (two) times daily for 30 days, THEN 1 tablet (40 mg total) daily. 120 tablet 0   Polyethyl Glycol-Propyl Glycol (SYSTANE) 0.4-0.3 % SOLN Place 1-2 drops into both eyes 3 (three) times daily as needed (dry/irritated eyes.).     SYNTHROID 88 MCG tablet TAKE 1 TABLET BY MOUTH EVERY DAY MONDAY TO SATURDAY AND OFF ON SUNDAYS (Patient taking differently: Take 44-88 mcg by mouth See admin instructions. TAKE 88 mcg BY MOUTH EVERY DAY MONDAY TO  SATURDAY AND 44 mg on Sunday in the morning) 90 tablet 0   No current facility-administered medications for this visit.    PHYSICAL EXAMINATION: ECOG PERFORMANCE STATUS: 1 - Symptomatic but completely ambulatory  Vitals:   12/03/23 1007 12/03/23 1008  BP: (!) 151/71 139/63  Pulse: 70   Resp: 17   Temp: (!) 97.5 F (36.4 C)   SpO2: 99%    Filed Weights   12/03/23 1007  Weight: 153 lb 14.4 oz (69.8 kg)  .Body mass index is 31.08 kg/m.    GENERAL:alert, in no acute distress and comfortable SKIN: no acute rashes, no significant lesions EYES: conjunctiva are pink and non-injected, sclera anicteric OROPHARYNX: MMM, no exudates, no oropharyngeal erythema or ulceration NECK: supple, no JVD LYMPH:  no palpable lymphadenopathy in the cervical, axillary or inguinal regions LUNGS: clear to auscultation b/l with normal respiratory effort HEART: regular rate & rhythm ABDOMEN:  normoactive bowel sounds , non tender, not distended. Extremity: no pedal edema PSYCH: alert & oriented x 3 with fluent speech NEURO: no focal motor/sensory deficits   LABORATORY DATA:   I have reviewed the data as listed  .    Latest Ref Rng & Units 12/01/2023    3:52 PM 11/25/2023    8:41 AM 11/08/2023   12:36 PM  CBC  WBC 3.8 - 10.8 Thousand/uL 5.6  6.1  6.5   Hemoglobin 11.7 - 15.5 g/dL 40.9  9.8  81.1   Hematocrit 35.0 - 45.0 % 30.6  31.4  31.7   Platelets 140 - 400 Thousand/uL 218  206  244    .    Latest Ref Rng & Units 12/01/2023    3:52 PM 11/25/2023    8:41 AM 11/08/2023   12:36 PM  CMP  Glucose 65 - 99 mg/dL 82  914  88   BUN 7 - 25 mg/dL 17  21  17    Creatinine 0.60 - 1.00 mg/dL 7.82  9.56  2.13   Sodium 135 - 146 mmol/L 143  142  144   Potassium 3.5 - 5.3 mmol/L 4.7  4.3  4.5   Chloride 98 - 110 mmol/L 107  109  105   CO2 20 - 32 mmol/L 29  28  25    Calcium 8.6 -  10.4 mg/dL 9.8  9.3  9.4   Total Protein 6.1 - 8.1 g/dL 6.5  6.8    Total Bilirubin 0.2 - 1.2 mg/dL 0.5  0.4    Alkaline  Phos 38 - 126 U/L  64    AST 10 - 35 U/L 18  21    ALT 6 - 29 U/L 11  12     Lab Results  Component Value Date   IRON 43 11/25/2023   TIBC 238 (L) 11/25/2023   IRONPCTSAT 18 11/25/2023   (Iron and TIBC)  Lab Results  Component Value Date   FERRITIN 147 11/25/2023          RADIOGRAPHIC STUDIES: I have personally reviewed the radiological images as listed and agreed with the findings in the report.  METASTATIC BONE SURVEY 0/02/2015   COMPARISON:  None.   FINDINGS: Heart is normal size. Right diaphragmatic hernia again noted, unchanged. Lungs are clear. No effusions.   No focal lytic lesions within the visualized bony structures or acute bony abnormality. Degenerative changes within the shoulders, lower cervical spine, mid to lower thoracic spine, and lower lumbar spine. Advanced degenerative facet disease in the lower lumbar spine. 9 mm of anterolisthesis of L4 on L5. Endplate sclerosis within L4 and L5. Mild degenerative changes in the hips. Advanced degenerative changes within the knees. 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   IMPRESSION: No focal lytic lesion.   Degenerative joint disease involving multiple joints as described above.   Grade 2 anterolisthesis of L4 on L5 related to facet disease.   ASSESSMENT & PLAN:   78 year old of an American female with   #1 IgG kappa monoclonal gammopathy of undetermined significance. Bone survey X-ray showed no lytic lesions. M spike is 0.6 g/dL in 82/956 and then 2.1H/YQ in 02/5783. SPEP in May/2017 and 07/2016 showed M proteinwas stable at 0.3g/dl  MGUS likely related to underlying connective tissue disorder.  Bone marrow biopsy does show about 13% plasma cells however these appear to be polyclonal and likely related to a possible underlying inflammatory disorder. Less likely a biclonal plasma cell dyscrasia.  #2 Normocytic normochromic anemia likely related to chronic inflammation from  recent diagnosis of lupus/rheumatoid arthritis + CKD.  Increased acanthocytes on peripheral blood smear -no overt evidence of liver disease. Could be a marker of some MDS Bone marrow biopsy did not show overt plasma cell dyscrasia or myelodysplastic syndrome. 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 Bone marrow biopsy showed decreased iron stores. Ferritin and iron have been trending down, she has not noticed any issues with bruising or bleeding recently. ?absorption issue. I have given her the option of IV iron which she has previously had without issue. She would like to have this.   PLAN:  -Discussed lab results from 12/01/2023 in detail with patient. CBC showed WBC of 5.6K, hemoglobin of 10.0, and platelets of 218K. -her hgb stable -labs from 11/25/2023 showed ferritin 147 and iron saturation 18% -discussed that there may be a role for higher iron stores to account for blood loss including from surgery and inflammation from an autoimmune condition. Target ferritin >=250 -recommend to continue IV iron to keep blood counts optimal -will order IV feraheme 510mg  weekly x 2 doses at [address] infusion -discussed that there may be a role for erythropoietin replacement if needed despite iron replacement -patient shall return to clinic in 6 months  FOLLOW-UP: IV feraheme 510mg  weekly x 2 doses at [address] infusion RTC with Dr Candise Che with labs in 6 months  The total time spent in the appointment was 23 minutes* .  All of the patient's questions were answered with apparent satisfaction. The patient knows to call the clinic with any problems, questions or concerns.   Wyvonnia Lora MD MS AAHIVMS [facility] Hematology/Oncology Physician [facility]  .*Total Encounter Time as defined by the Centers for Medicare and Medicaid Services includes, in addition to the face-to-face time of a patient visit (documented  in the note above) non-face-to-face time: obtaining and reviewing outside history, ordering and reviewing medications, tests or procedures, care coordination (communications with other health care professionals or caregivers) and documentation in the medical record.    I,Mitra Faeizi,acting as a Neurosurgeon for Wyvonnia Lora, MD.,have documented all relevant documentation on the behalf of Wyvonnia Lora, MD,as directed by  Wyvonnia Lora, MD while in the presence of Wyvonnia Lora, MD.  .I have reviewed the above documentation for accuracy and completeness, and I agree with the above. Johney Maine MD

## 2023-12-03 NOTE — Progress Notes (Signed)
 Double-stranded DNA remains positive

## 2023-12-06 ENCOUNTER — Other Ambulatory Visit: Payer: Self-pay | Admitting: Hematology

## 2023-12-06 ENCOUNTER — Telehealth: Payer: Self-pay | Admitting: Pharmacy Technician

## 2023-12-06 NOTE — Telephone Encounter (Signed)
 Auth Submission: NO AUTH NEEDED Site of care: Site of care: [facility] Payer: [employer] MEDICARE Medication & CPT/J Code(s) submitted: Venofer (Iron Sucrose) J1756 Route of submission (phone, fax, portal):  Phone # Fax # Auth type: Buy/Bill PB Units/visits requested: 5 DOSES Reference number:  Approval from: 12/06/23 to 05/07/24

## 2023-12-07 DIAGNOSIS — E039 Hypothyroidism, unspecified: Secondary | ICD-10-CM | POA: Diagnosis not present

## 2023-12-07 DIAGNOSIS — E782 Mixed hyperlipidemia: Secondary | ICD-10-CM | POA: Diagnosis not present

## 2023-12-07 DIAGNOSIS — I10 Essential (primary) hypertension: Secondary | ICD-10-CM | POA: Diagnosis not present

## 2023-12-07 DIAGNOSIS — K5730 Diverticulosis of large intestine without perforation or abscess without bleeding: Secondary | ICD-10-CM | POA: Diagnosis not present

## 2023-12-07 DIAGNOSIS — D509 Iron deficiency anemia, unspecified: Secondary | ICD-10-CM | POA: Diagnosis not present

## 2023-12-07 NOTE — Therapy (Signed)
 OUTPATIENT PHYSICAL THERAPY LOWER EXTREMITY TREATMENT   Patient Name: Hannah [name] MRN: [id number] DOB:[birth year], 78 y.o., female Today's Date:    END OF SESSION:              Past Medical History:  Diagnosis Date   Allergy 25 years ago.   Anemia    Arthritis    Asthma    Atrial fibrillation (HCC)    Cataract Had cataracts surgery 2015 & 2016   Chronic kidney disease    Diabetes mellitus without complication (HCC)    Gout 12/17/2014   patient reported   Heart murmur 4 years ago   Hyperlipidemia    Hypertension    Hypothyroidism    Systemic lupus erythematosus (HCC)    Vitiligo    Past Surgical History:  Procedure Laterality Date   BIOPSY  09/26/2023   Procedure: BIOPSY;  Surgeon: Meryl Dare, MD;  Location: [facility];  Service: Gastroenterology;;   CATARACT EXTRACTION Bilateral 2015   COLONOSCOPY WITH PROPOFOL N/A 09/27/2023   Procedure: COLONOSCOPY WITH PROPOFOL;  Surgeon: [name], MD;  Location: [facility];  Service: Gastroenterology;  Laterality: N/A;   DILATION AND CURETTAGE OF UTERUS     DOPPLER ECHOCARDIOGRAPHY  05/2018   Internist to review with pt; potential heart murmur 06/20/18   ESOPHAGOGASTRODUODENOSCOPY (EGD) WITH PROPOFOL N/A 09/26/2023   Procedure: ESOPHAGOGASTRODUODENOSCOPY (EGD) WITH PROPOFOL;  Surgeon: Meryl Dare, MD;  Location: [facility];  Service: Gastroenterology;  Laterality: N/A;   EYE SURGERY  Cataract Surgery, 2015 & 2016.   keratosis removal  2021   KNEE CLOSED REDUCTION Left 07/06/2022   Procedure: CLOSED MANIPULATION KNEE;  Surgeon: Ollen Gross, MD;  Location: [facility];  Service: Orthopedics;  Laterality: Left;   KNEE CLOSED REDUCTION Right 09/13/2023   Procedure: CLOSED MANIPULATION KNEE;  Surgeon: Ollen Gross, MD;  Location: [facility];  Service: Orthopedics;  Laterality: Right;   REPLACEMENT TOTAL KNEE Left    SKIN SURGERY  11/30/2018   left side of face   TOOTH EXTRACTION     TOTAL KNEE  ARTHROPLASTY Left 02/02/2022   Procedure: TOTAL KNEE ARTHROPLASTY;  Surgeon: Ollen Gross, MD;  Location: [facility];  Service: Orthopedics;  Laterality: Left;   TOTAL KNEE ARTHROPLASTY Right 07/12/2023   Procedure: RIGHT TOTAL KNEE ARTHROPLASTY;  Surgeon: Ollen Gross, MD;  Location: [facility];  Service: Orthopedics;  Laterality: Right;   Patient Active Problem List   Diagnosis Date Noted   Class 1 obesity due to excess calories with serious comorbidity and body mass index (BMI) of 32.0 to 32.9 in adult 10/17/2023   Chronic superficial gastritis with bleeding 10/17/2023   Occult blood detected in feces by immunoassay 09/26/2023   ABLA (acute blood loss anemia) 09/23/2023   GI bleed 09/23/2023   Pure hypercholesterolemia 07/18/2023   Personal history of COVID-19 07/18/2023   Primary osteoarthritis of right knee 07/12/2023   Acquired thrombophilia (HCC) 01/12/2023   Arthrofibrosis of knee joint, right 07/06/2022   OA (osteoarthritis) of knee 02/02/2022   Primary osteoarthritis of left knee 02/02/2022   Chronic bilateral low back pain without sciatica 06/25/2021   Paresthesia and pain of extremity 06/25/2021   Atrial fibrillation (HCC) 04/16/2021   Demand ischemia (HCC) 03/26/2021   Unspecified atrial fibrillation (HCC) 03/26/2021   COVID-19 virus infection 03/26/2021   PVC (premature ventricular contraction) 05/06/2020   Type 2 diabetes mellitus with stage 3 chronic kidney disease, without long-term current use of insulin (HCC) 03/20/2020   Palpitations 03/20/2020   Hypertensive  heart and renal disease 03/20/2020   Primary hypothyroidism 03/20/2020   Vitiligo 03/20/2020   Iron deficiency anemia 08/12/2017   Osteoporosis 10/02/2016   Systemic lupus erythematosus (HCC) 10/02/2016   Rheumatoid arthritis with rheumatoid factor of multiple sites without organ or systems involvement (HCC) 10/02/2016   High risk medication use 10/02/2016   History of chronic kidney disease 10/02/2016    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/2016   Primary osteoarthritis of both knees 10/02/2016   Vitamin D deficiency 10/02/2016   History of diabetes mellitus 10/02/2016   History of hypertension 10/02/2016   History of asthma 10/02/2016   Absolute anemia    MGUS (monoclonal gammopathy of unknown significance)    Normocytic anemia 03/21/2015   Essential hypertension 03/20/2015   Abnormal CT of the chest 07/30/2012   Asthma 06/19/2012    PCP: Dorothyann Peng, MD  REFERRING PROVIDER: Ollen Gross, MD   REFERRING DIAG: M17.11 (ICD-10-CM) - Unilateral primary osteoarthritis, right knee   THERAPY DIAG:  No diagnosis found.  Rationale for Evaluation and Treatment: Rehabilitation  ONSET DATE: 07/12/23  SUBJECTIVE:   SUBJECTIVE STATEMENT: 12/02/23: Pt reports she has been walking with her small base SPC a little more.  EVAL: Pt reports she has been completing her HEP 1x daily and using cold packs on a frequent basis for pain and swelling management.  PAIN:  Are you having pain? Yes: NPRS scale: 4/10 intermittent; pain range 5-10/10 Pain location: R knee Pain description: ache ,throb ,sharp Aggravating factors: movement, lifting, wt bearing Relieving factors: Pain medication, muscle relaxer, cold packs  PERTINENT HISTORY: High BMI, L LKA, Afib  PRECAUTIONS: Knee  RED FLAGS: None   WEIGHT BEARING RESTRICTIONS: No  FALLS:  Has patient fallen in last 6 months? No  LIVING ENVIRONMENT: Lives with: lives alone Lives in: House/apartment Stairs: No Has following equipment at home: Single point cane, Environmental consultant - 2 wheeled, Wheelchair (manual), and bed side commode  OCCUPATION: Retired  PLOF: Independent with household mobility with device  PATIENT GOALS: good use of my R knee  NEXT MD VISIT: 07/27/23  OBJECTIVE:  Note: Objective measures were completed at Evaluation unless otherwise noted.  DIAGNOSTIC FINDINGS: None for the R knee in  Epic  PATIENT SURVEYS:  FOTO: Perceived function   22%, predicted   43%   COGNITION: Overall cognitive status: Within functional limits for tasks assessed     SENSATION: WFL  EDEMA:  Significant R knee edema present   MUSCLE LENGTH: Hamstrings: Right NT deg; Left NT deg Thomas test: Right NT deg; Left NT deg  POSTURE: rounded shoulders, forward head, and anterior pelvic tilt  PALPATION: TTP to the R peri-knee  LOWER EXTREMITY ROM:  Active ROM Right eval Left eval Rt 07/20/23 Rt 07/22/23 RT 07/27/23 Rt 07/29/23 08/03/23 Rt 08/06/23  Hip flexion          Hip extension          Hip abduction          Hip adduction          Hip internal rotation          Hip external rotation          Knee flexion 50  70 AA 80 AA75 AA85 AA 72 AA85  Knee extension 30  30 30 23  25d  30  Ankle dorsiflexion          Ankle plantarflexion          Ankle inversion  Ankle eversion           (Blank rows = not tested) 08/11/23:30-85d. 08/19/23=AAROM 30-90d. 08/31/23=AA90d   09/02/23=AA 30-85d. 09/08/23=AA30-90. 09/14/23=AA34-80: 10/01/23: 28-90 AA (end of session seated). 10/05/23: 28-90 AA (end of session supine). 10/12/23: 33d AA (end of session supine). 10/12/23: 33d AA (end of session supine). 10/15/23: 28d AA (end of session supine); 10/19/23:86-28d AA (end of session supine). 10/22/23:86-28d AA (end of session supine). 10/26/23: ext= 28AA. 11/02/23: pre PT 33d; post PT 27dAA; post PT ext ROM= -25d. 11/09/23=post PT ext AAROM= -25d.  11/23/23=post PT ext AAROM= -25d. 11/26/23=post PT ext AAROM= -25-90d. 12/02/23=post PT ext AAROM= -25-90d.   LOWER EXTREMITY MMT:  MMT Right eval Left eval RT 07/27/23 Rt 09/02/23  Hip flexion 2  3 with limited ROM 4+  Hip extension 2   4+  Hip abduction 2   4+  Hip adduction      Hip internal rotation      Hip external rotation 2   4+  Knee flexion 2   4+ with limited ROM  Knee extension 2  3 with limited ROM 4+ with limited ROM  Ankle dorsiflexion      Ankle  plantarflexion      Ankle inversion      Ankle eversion       (Blank rows = not tested)  LOWER EXTREMITY SPECIAL TESTS:  NT  FUNCTIONAL TESTS:  5 times sit to stand: TBA 2 minute walk test: TBA  5xSTS=24.7 c arm rest  2MWT=78' c RW GAIT: Distance walked: 72ft Assistive device utilized: Environmental consultant - 2 wheeled Level of assistance: SBA Comments: Antalgic over the R LE  TRANSFER: Mod A for mat table transfer c A for the R LE  TODAY'S TREATMENT:  [facility] Adult PT Treatment:                                                DATE: 12/07/22 Therapeutic Exercise: *** Manual Therapy: *** Neuromuscular re-ed: *** Therapeutic Activity: *** Modalities: *** Self Care: ***  [facility] Adult PT Treatment:                                                DATE: 12/01/22 Manual Therapy: A/P and P/A fem/tib grade 4 mobs  Contact/relax stretching for knee extension in supine and prone Grade 4 patella mobs all directions with emphasis on superior Therapeutic Activity: Nustep 7 mins L5 UE/LE Gait training with SPC 180"- Good stability for the entire distance. Pt requested to sit down at close of walk due to R LE fatigue Lateral step ups/downs 6" c counter assist Stair training c 6" box and 8" box c small base SPC and counter support Therapeutic Exercise: Standing hip flex, abd, ext and knee flex 2x10 5# LAQ 2x15 5#  [facility] Adult PT Treatment:                                                DATE: 11/26/23 Manual Therapy: A/P and P/A fem/tib grade 4 mobs  Contact/relax stretching for knee extension in supine and prone Grade 4 patella mobs  all directions with emphasis on superior Therapeutic Activity: Nustep 7 mins L5 UE/LE Gait training with SPC 150"- more labored after approx 75' Lateral step ups/downs 6" c counter assist Therapeutic Exercise: Seated knee flexion c pt over pressure x3 30" Standing step knee flexion stretch x3 30" LAQ 2x15 5# AAROM for knee flexion and ext c strap and slide disc  x10  [facility] Adult PT Treatment:                                                DATE: 11/23/23 Manual Therapy: A/P and P/A fem/tib grade 4 mobs  Contact/relax stretching for knee extension in supine and prone LAD R LE Grade 4 patella mobs all directions with emphasis on superior Therapeutic Activity: Nustep 8 mins L5 UE/LE   PATIENT EDUCATION:  Education details: Eval findings, POC, HEP, self care  Person educated: Patient Education method: Explanation, Demonstration, Tactile cues, Verbal cues, and Handouts Education comprehension: verbalized understanding, returned demonstration, verbal cues required, and tactile cues required  HOME EXERCISE PROGRAM: Access Code: [email or web address] URL: [email or web address] Date: 08/19/2023 Prepared by: Joellyn Rued  Exercises - Long Sitting Quad Set (Mirrored)  - 3 x daily - 7 x weekly - 1 sets - 10 reps - 5 hold - Supine Heel Slide with Strap  - 3 x daily - 7 x weekly - 1 sets - 10 reps - 10 hold - Supine Knee Extension Strengthening (Mirrored)  - 3 x daily - 7 x weekly - 1 sets - 10 reps - 3 hold - Supine Hip Abduction AROM (Mirrored)  - 3 x daily - 7 x weekly - 1 sets - 10 reps - 3 hold - Seated Knee Flexion Slide  - 3 x daily - 7 x weekly - 1 sets - 10 reps - 10 hold - Active Straight Leg Raise with Quad Set  - 3 x daily - 7 x weekly - 1 sets - 10 reps - Seated Knee Extension Stretch with Chair  - 3 x daily - 7 x weekly - 1 sets  ASSESSMENT:  CLINICAL IMPRESSION: 12/02/23: PT was completed for R knee/LE strengthening, ROM, and gait/stair training. AAROM remains at the same level, limited for both flexion and extension. With using a small base SPC (decreased assist vs rollater),  pt's walking tolerance was improved and the R knee demonstrated proper control. Following stair training, pt asd/dsc with proper technique. Pt tolerated PT today without adverse effects. Discussed with pt possible DC from PT sevices next week due to R knee ROM  plateauing and with her making appropriate functional gains.      OBJECTIVE IMPAIRMENTS: decreased activity tolerance, decreased mobility, difficulty walking, decreased ROM, decreased strength, increased edema, obesity, and pain.   ACTIVITY LIMITATIONS: carrying, lifting, bending, sitting, standing, squatting, sleeping, stairs, transfers, bed mobility, bathing, toileting, dressing, locomotion level, and caring for others  PARTICIPATION LIMITATIONS: meal prep, cleaning, laundry, and driving  PERSONAL FACTORS: Age, Fitness, Past/current experiences, Time since onset of injury/illness/exacerbation, and 1 comorbidity: high BMI  are also affecting patient's functional outcome.   REHAB POTENTIAL: Good  CLINICAL DECISION MAKING: Stable/uncomplicated  EVALUATION COMPLEXITY: Low   GOALS:  SHORT TERM GOALS: Target date: 08/06/23 Pt will be Ind in an initial HEP  Baseline: started Goal status: MET  2.  Pt will be able to complete a SLR and SAQ with or without  quad lag Baseline: not able to complete 07/29/23: able to complete both with limited knee ext ROM present Goal status: MET  3.  Increase R knee AROM to 15-80d for improved R knee function Baseline: 30-50 10/01/23: AAROM 28-90 Goal status: PARTIALLY MET FOR FLEXION  LONG TERM GOALS: Target date: 10/22/23  Pt will be Ind in a final HEP to maintain achieved LOF Baseline: started Goal status: ONGOING  2.  Increase R hip and knee strength to 4/5 for improved function use of the R LE Baseline: see flow sheets 09/02/23: see flow sheets Goal status: MET  3.  Increased R knee AROM to 5-115d for appropriate fuctional mobility Baseline: 30-50d 09/02/23: 30-85d 10/22/23: 28-86d 11/04/23:  25d lacking Goal status: NOT MET  4.  Improve 5xSTS by MCID of 5" and by MCID of 42ft as indication of improved functional mobility  Baseline: TBA when pt has progressed to gait c a [facility] 08/31/23: 5xSTS=24.7 c arm rest      2MWT=78' c RW 10/15/23:   5xSTS=11.8 s arm rest      2MWT= 185' c RW Goal status: MET  5.  Pt's FOTO score will improved to the predicted value of 43% as indication of improved function  Baseline: 22% 10/22/23:48% Goal status: MET  6.  Pt will be able to walk 427ft c or s a SPC and ascend/descend 12 steps c or s a SPC and c use of a HR for community mobility Baseline: 48ft c RW 10/22/23: 33ft  Goal status: ONGOING   PLAN:  PT FREQUENCY: 2x/week  PT DURATION: 6 weeks  PLANNED INTERVENTIONS: 97164- PT Re-evaluation, 97110-Therapeutic exercises, 97530- Therapeutic activity, 97535- Self Care, 74259- Manual therapy, [id number]- Gait training, 97014- Electrical stimulation (unattended), [id number]- Electrical stimulation (manual), 97016- Vasopneumatic device, Stair training, Dry Needling, Joint mobilization, Cryotherapy, and Moist heat  PLAN FOR NEXT SESSION: Review FOTO; assess response to HEP; progress therex as indicated; use of modalities, manual therapy; and TPDN as indicated.   Brittie Whisnant MS, PT 12/07/23 6:07 PM

## 2023-12-08 ENCOUNTER — Ambulatory Visit: Payer: Medicare HMO

## 2023-12-08 DIAGNOSIS — R262 Difficulty in walking, not elsewhere classified: Secondary | ICD-10-CM

## 2023-12-08 DIAGNOSIS — G8929 Other chronic pain: Secondary | ICD-10-CM | POA: Diagnosis not present

## 2023-12-08 DIAGNOSIS — M25561 Pain in right knee: Secondary | ICD-10-CM | POA: Diagnosis not present

## 2023-12-08 DIAGNOSIS — M6281 Muscle weakness (generalized): Secondary | ICD-10-CM | POA: Diagnosis not present

## 2023-12-09 ENCOUNTER — Encounter: Payer: Self-pay | Admitting: Hematology

## 2023-12-09 ENCOUNTER — Ambulatory Visit: Payer: Self-pay

## 2023-12-09 NOTE — Patient Instructions (Signed)
 Visit Information  Thank you for taking time to visit with me today. Please don't hesitate to contact me if I can be of assistance to you.   Following are the goals we discussed today:   Goals Addressed             This Visit's Progress    COMPLETED: To maintain current BMI       Care Coordination Interventions: Evaluation of current treatment plan related to weight loss and patient's adherence to plan as established by provider  Reviewed and discussed patient's most recent recorded weight, she remains to be around 153-154 lbs Determined patient continues to maintain her weight with no further weight loss noted Instructed patient to continue to balance her activity with rest, continue to monitor weight and eat a well balanced diet Instructed patient to keep her doctor informed of new symptoms or concerns and she verbalizes understanding Ht 1.499 m (4\' 11" )     Wt 153 lb 14.4 oz (69.8 kg)     BMI 31.08 kg/m     BSA 1.7 m       COMPLETED: To undergo right total knee surgery       Care Coordination Interventions: Evaluation of current treatment plan related to arthritis of right knee  and patient's adherence to plan as established by provider Determined patient continues to work with PT as directed, twice weekly, she is wearing a JAS brace as directed, reports having 1 session left with PT Discussed the importance of adherence to her HEP Encouraged patient to notify her doctor of new or worsening symptoms and or if she fails to make progress towards her goals  Encouraged patient to discuss getting orders for prn PT as needed Reviewed and discussed patient's next scheduled follow with Dr. Lequita Halt, orthopedic surgeon         Our next appointment is by telephone on 02/10/24 at 09:15 AM  Please call the care guide team at [phone] if you need to cancel or reschedule your appointment.   If you are experiencing a Mental Health or Behavioral Health Crisis or need someone to talk  to, please call [phone]-TALK (toll free, 24 hour hotline)  Patient verbalizes understanding of instructions and care plan provided today and agrees to view in MyChart. Active MyChart status and patient understanding of how to access instructions and care plan via MyChart confirmed with patient.     Delsa Sale RN BSN CCM [place]  [facility], [facility] Nurse Care Coordinator  Direct Dial: [phone] Website: [email or web address]

## 2023-12-09 NOTE — Patient Outreach (Signed)
 Care Coordination   Follow Up Visit Note   12/09/2023 Name: MIN TUNNELL MRN: [id number] DOB: [birth year]  MAYMUNA DETZEL is a 78 y.o. year old female who sees Dorothyann Peng, MD for primary care. I spoke with  [name] by phone today.  What matters to the patients health and wellness today?  Patient would like to continue her HEP to help achieve ambulation without use of her cane.     Goals Addressed             This Visit's Progress    COMPLETED: To maintain current BMI       Care Coordination Interventions: Evaluation of current treatment plan related to weight loss and patient's adherence to plan as established by provider  Reviewed and discussed patient's most recent recorded weight, she remains to be around 153-154 lbs Determined patient continues to maintain her weight with no further weight loss noted Instructed patient to continue to balance her activity with rest, continue to monitor weight and eat a well balanced diet Instructed patient to keep her doctor informed of new symptoms or concerns and she verbalizes understanding Ht 1.499 m (4\' 11" )     Wt 153 lb 14.4 oz (69.8 kg)     BMI 31.08 kg/m     BSA 1.7 m       COMPLETED: To undergo right total knee surgery       Care Coordination Interventions: Evaluation of current treatment plan related to arthritis of right knee  and patient's adherence to plan as established by provider Determined patient continues to work with PT as directed, twice weekly, she is wearing a JAS brace as directed, reports having 1 session left with PT Discussed the importance of adherence to her HEP Encouraged patient to notify her doctor of new or worsening symptoms and or if she fails to make progress towards her goals  Encouraged patient to discuss getting orders for prn PT as needed Reviewed and discussed patient's next scheduled follow with Dr. Lequita Halt, orthopedic surgeon     Interventions Today    Flowsheet Row  Most Recent Value  Chronic Disease   Chronic disease during today's visit Other  [right TKA,  iron deficiency anemia,  osteoporosis]  General Interventions   General Interventions Discussed/Reviewed General Interventions Discussed, General Interventions Reviewed, Doctor Visits, Labs, Durable Medical Equipment (DME)  Doctor Visits Discussed/Reviewed Doctor Visits Discussed, Doctor Visits Reviewed, PCP, Specialist  Durable Medical Equipment (DME) Dan Humphreys, Other  [cane]  Exercise Interventions   Exercise Discussed/Reviewed Physical Activity, Exercise Reviewed, Exercise Discussed, Assistive device use and maintanence, Weight Managment  Physical Activity Discussed/Reviewed Physical Activity Discussed, Physical Activity Reviewed, Types of exercise, Home Exercise Program (HEP)  Weight Management Weight maintenance  Pharmacy Interventions   Pharmacy Dicussed/Reviewed Pharmacy Topics Reviewed, Pharmacy Topics Discussed, Medications and their functions  Safety Interventions   Safety Discussed/Reviewed Home Safety, Fall Risk, Safety Reviewed, Safety Discussed  Home Safety Assistive Devices          SDOH assessments and interventions completed:  No     Care Coordination Interventions:  Yes, provided   Follow up plan: Follow up call scheduled for 02/10/24 @09 :00 AM    Encounter Outcome:  Patient Visit Completed

## 2023-12-10 ENCOUNTER — Ambulatory Visit: Payer: Medicare HMO

## 2023-12-10 DIAGNOSIS — M6281 Muscle weakness (generalized): Secondary | ICD-10-CM

## 2023-12-10 DIAGNOSIS — R262 Difficulty in walking, not elsewhere classified: Secondary | ICD-10-CM | POA: Diagnosis not present

## 2023-12-10 DIAGNOSIS — G8929 Other chronic pain: Secondary | ICD-10-CM

## 2023-12-10 DIAGNOSIS — M25561 Pain in right knee: Secondary | ICD-10-CM | POA: Diagnosis not present

## 2023-12-10 NOTE — Therapy (Addendum)
 " OUTPATIENT PHYSICAL THERAPY LOWER EXTREMITY TREATMENT/Discharge   Patient Name: Hannah [name] MRN: [id number] DOB:08-02-1946, 78 y.o., female Today's Date:    END OF SESSION:  PT End of Session - 12/10/23 1456     Visit Number 27    Number of Visits 33    Date for PT Re-Evaluation 12/10/23    Authorization Type [employer] H[employer]    Authorization Time Period Approved 12 visits 10/25/23-12/11/23    Authorization - Visit Number 9    Authorization - Number of Visits 12    PT Start Time 1230    PT Stop Time 1315    PT Time Calculation (min) 45 min    Activity Tolerance Patient tolerated treatment well    Behavior During Therapy WFL for tasks assessed/performed                         Past Medical History:  Diagnosis Date   Allergy  25 years ago.   Anemia    Arthritis    Asthma    Atrial fibrillation (HCC)    Cataract Had cataracts surgery 2015 & 2016   Chronic kidney disease    Diabetes mellitus without complication (HCC)    Gout 12/17/2014   patient reported   Heart murmur 4 years ago   Hyperlipidemia    Hypertension    Hypothyroidism    Systemic lupus erythematosus (HCC)    Vitiligo    Past Surgical History:  Procedure Laterality Date   BIOPSY  09/26/2023   Procedure: BIOPSY;  Surgeon: Aneita Gwendlyn DASEN, MD;  Location: [facility];  Service: Gastroenterology;;   CATARACT EXTRACTION Bilateral 2015   COLONOSCOPY WITH PROPOFOL  N/A 09/27/2023   Procedure: COLONOSCOPY WITH PROPOFOL ;  Surgeon: Kristie Lamprey, MD;  Location: [facility];  Service: Gastroenterology;  Laterality: N/A;   DILATION AND CURETTAGE OF UTERUS     DOPPLER ECHOCARDIOGRAPHY  05/2018   Internist to review with pt; potential heart murmur 06/20/18   ESOPHAGOGASTRODUODENOSCOPY (EGD) WITH PROPOFOL  N/A 09/26/2023   Procedure: ESOPHAGOGASTRODUODENOSCOPY (EGD) WITH PROPOFOL ;  Surgeon: Aneita Gwendlyn DASEN, MD;  Location: [facility];  Service: Gastroenterology;  Laterality: N/A;   EYE  SURGERY  Cataract Surgery, 2015 & 2016.   keratosis removal  2021   KNEE CLOSED REDUCTION Left 07/06/2022   Procedure: CLOSED MANIPULATION KNEE;  Surgeon: Melodi Lerner, MD;  Location: [facility];  Service: Orthopedics;  Laterality: Left;   KNEE CLOSED REDUCTION Right 09/13/2023   Procedure: CLOSED MANIPULATION KNEE;  Surgeon: Melodi Lerner, MD;  Location: [facility];  Service: Orthopedics;  Laterality: Right;   REPLACEMENT TOTAL KNEE Left    SKIN SURGERY  11/30/2018   left side of face   TOOTH EXTRACTION     TOTAL KNEE ARTHROPLASTY Left 02/02/2022   Procedure: TOTAL KNEE ARTHROPLASTY;  Surgeon: Melodi Lerner, MD;  Location: [facility];  Service: Orthopedics;  Laterality: Left;   TOTAL KNEE ARTHROPLASTY Right 07/12/2023   Procedure: RIGHT TOTAL KNEE ARTHROPLASTY;  Surgeon: Melodi Lerner, MD;  Location: [facility];  Service: Orthopedics;  Laterality: Right;   Patient Active Problem List   Diagnosis Date Noted   Class 1 obesity due to excess calories with serious comorbidity and body mass index (BMI) of 32.0 to 32.9 in adult 10/17/2023   Chronic superficial gastritis with bleeding 10/17/2023   Occult blood detected in feces by immunoassay 09/26/2023   ABLA (acute blood loss anemia) 09/23/2023   GI bleed 09/23/2023   Pure hypercholesterolemia 07/18/2023  Personal history of COVID-19 07/18/2023   Primary osteoarthritis of right knee 07/12/2023   Acquired thrombophilia (HCC) 01/12/2023   Arthrofibrosis of knee joint, right 07/06/2022   OA (osteoarthritis) of knee 02/02/2022   Primary osteoarthritis of left knee 02/02/2022   Chronic bilateral low back pain without sciatica 06/25/2021   Paresthesia and pain of extremity 06/25/2021   Atrial fibrillation (HCC) 04/16/2021   Demand ischemia (HCC) 03/26/2021   Unspecified atrial fibrillation (HCC) 03/26/2021   COVID-19 virus infection 03/26/2021   PVC (premature ventricular contraction) 05/06/2020   Type 2 diabetes mellitus with stage 3 chronic kidney  disease, without long-term current use of insulin  (HCC) 03/20/2020   Palpitations 03/20/2020   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   Primary hypothyroidism 03/20/2020   Vitiligo 03/20/2020   Iron  deficiency anemia 08/12/2017   Osteoporosis 10/02/2016   Systemic lupus erythematosus (HCC) 10/02/2016   Rheumatoid arthritis with rheumatoid factor of multiple sites without organ or systems involvement (HCC) 10/02/2016   High risk medication use 10/02/2016   History of chronic kidney disease 10/02/2016   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/2016   Primary osteoarthritis of both knees 10/02/2016   Vitamin D  deficiency 10/02/2016   History of diabetes mellitus 10/02/2016   History of hypertension 10/02/2016   History of asthma 10/02/2016   Absolute anemia    MGUS (monoclonal gammopathy of unknown significance)    Normocytic anemia 03/21/2015   Essential hypertension 03/20/2015   Abnormal CT of the chest 07/30/2012   Asthma 06/19/2012    PCP: Jarold Medici, MD  REFERRING PROVIDER: Melodi Lerner, MD   REFERRING DIAG: M17.11 (ICD-10-CM) - Unilateral primary osteoarthritis, right knee   THERAPY DIAG:  Chronic pain of right knee  Muscle weakness (generalized)  Difficulty in walking, not elsewhere classified  Rationale for Evaluation and Treatment: Rehabilitation  ONSET DATE: 07/12/23  SUBJECTIVE:   SUBJECTIVE STATEMENT: 12/10/23: Pt reports feeling confident assc/dsc steps c a SPC and HR. Pt states she tried to contact Dr. Melodi, but was only able to leave a message.  EVAL: Pt reports she has been completing her HEP 1x daily and using cold packs on a frequent basis for pain and swelling management.  PAIN:  Are you having pain? Yes: NPRS scale: 4/10 intermittent; pain range 5-10/10 Pain location: R knee Pain description: ache ,throb ,sharp Aggravating factors: movement, lifting, wt bearing Relieving factors: Pain medication, muscle  relaxer, cold packs  PERTINENT HISTORY: High BMI, L LKA, Afib  PRECAUTIONS: Knee  RED FLAGS: None   WEIGHT BEARING RESTRICTIONS: No  FALLS:  Has patient fallen in last 6 months? No  LIVING ENVIRONMENT: Lives with: lives alone Lives in: House/apartment Stairs: No Has following equipment at home: Single point cane, Environmental Consultant - 2 wheeled, Wheelchair (manual), and bed side commode  OCCUPATION: Retired  PLOF: Independent with household mobility with device  PATIENT GOALS: good use of my R knee  NEXT MD VISIT: 07/27/23  OBJECTIVE:  Note: Objective measures were completed at Evaluation unless otherwise noted.  DIAGNOSTIC FINDINGS: None for the R knee in Epic  PATIENT SURVEYS:  FOTO: Perceived function   22%, predicted   43%   COGNITION: Overall cognitive status: Within functional limits for tasks assessed     SENSATION: WFL  EDEMA:  Significant R knee edema present   MUSCLE LENGTH: Hamstrings: Right NT deg; Left NT deg Thomas test: Right NT deg; Left NT deg  POSTURE: rounded shoulders, forward head, and anterior pelvic tilt  PALPATION: TTP  to the R peri-knee  LOWER EXTREMITY ROM:  Active ROM Right eval Left eval Rt 07/20/23 Rt 07/22/23 RT 07/27/23 Rt 07/29/23 08/03/23 Rt 08/06/23  Hip flexion          Hip extension          Hip abduction          Hip adduction          Hip internal rotation          Hip external rotation          Knee flexion 50  70 AA 80 AA75 AA85 AA 72 AA85  Knee extension 30  30 30 23  25d  30  Ankle dorsiflexion          Ankle plantarflexion          Ankle inversion          Ankle eversion           (Blank rows = not tested) 08/11/23:30-85d. 08/19/23=AAROM 30-90d. 08/31/23=AA90d   09/02/23=AA 30-85d. 09/08/23=AA30-90. 09/14/23=AA34-80: 10/01/23: 28-90 AA (end of session seated). 10/05/23: 28-90 AA (end of session supine). 10/12/23: 33d AA (end of session supine). 10/12/23: 33d AA (end of session supine). 10/15/23: 28d AA (end of session  supine); 10/19/23:86-28d AA (end of session supine). 10/22/23:86-28d AA (end of session supine). 10/26/23: ext= 28AA. 11/02/23: pre PT 33d; post PT 27dAA; post PT ext ROM= -25d. 11/09/23=post PT ext AAROM= -25d.  11/23/23=post PT ext AAROM= -25d. 11/26/23=post PT ext AAROM= -25-90d. 12/02/23=post PT ext AAROM= -25-90d. 12/08/23=post PT ext AAROM= -25-90d. 12/10/23=post PT ext AAROM= -25-90d.  LOWER EXTREMITY MMT:  MMT Right eval Left eval RT 07/27/23 Rt 09/02/23  Hip flexion 2  3 with limited ROM 4+  Hip extension 2   4+  Hip abduction 2   4+  Hip adduction      Hip internal rotation      Hip external rotation 2   4+  Knee flexion 2   4+ with limited ROM  Knee extension 2  3 with limited ROM 4+ with limited ROM  Ankle dorsiflexion      Ankle plantarflexion      Ankle inversion      Ankle eversion       (Blank rows = not tested)  LOWER EXTREMITY SPECIAL TESTS:  NT  FUNCTIONAL TESTS:  5 times sit to stand: TBA 2 minute walk test: TBA  5xSTS=24.7 c arm rest  2MWT=78' c RW GAIT: Distance walked: 70ft Assistive device utilized: Environmental Consultant - 2 wheeled Level of assistance: SBA Comments: Antalgic over the R LE  TRANSFER: Mod A for mat table transfer c A for the R LE  TODAY'S TREATMENT:  [facility] Adult PT Treatment:                                                DATE: 3/14/225 Therapeutic Activity: Nustep 7 mins L5 LE Gait training with SPC 210', 420' total c SPC Stair training ascending/descending 6 steps c SPC and HR, Ind Therapeutic Exercise: Standing hip flex, abd, ext and knee flex 2x10 5# LAQ 2x15 5# Seated knee flexion stretch Seated knee ext/hamstring stretch Final HEP  [facility] Adult PT Treatment:  DATE: 12/07/22 Manual Therapy: A/P and P/A fem/tib grade 4 mobs  Contact/relax stretching for knee extension in supine and prone Grade 4 patella mobs all directions with emphasis on superior Therapeutic Activity: Nustep 7 mins L5 LE Gait  training with SPC  Stair training ascending/descending 6 steps c SPC and HR Therapeutic Exercise: Standing hip flex, abd, ext and knee flex 2x10 5# LAQ 2x15 5#  [facility] Adult PT Treatment:                                                DATE: 12/01/22 Manual Therapy: A/P and P/A fem/tib grade 4 mobs  Contact/relax stretching for knee extension in supine and prone Grade 4 patella mobs all directions with emphasis on superior Therapeutic Activity: Nustep 7 mins L5 UE/LE Gait training with SPC 180 Stair training c 6 box and 8 box c small base SPC and counter support Therapeutic Exercise: Standing hip flex, abd, ext and knee flex 2x10 5# LAQ 2x15 5#   PATIENT EDUCATION:  Education details: Eval findings, POC, HEP, self care  Person educated: Patient Education method: Explanation, Demonstration, Tactile cues, Verbal cues, and Handouts Education comprehension: verbalized understanding, returned demonstration, verbal cues required, and tactile cues required  HOME EXERCISE PROGRAM: Access Code: [email or web address] URL: [email or web address] Date: 12/10/2023 Prepared by: Dasie Daft  Exercises - Supine Heel Slide with Strap  - 3 x daily - 7 x weekly - 1 sets - 10 reps - 10 hold - Seated Knee Extension Stretch with Chair  - 3 x daily - 7 x weekly - 1 sets - Seated Knee Flexion Slide  - 3 x daily - 7 x weekly - 1 sets - 10 reps - 10 hold - Active Straight Leg Raise with Quad Set  - 1 x daily - 7 x weekly - 3 sets - 10 reps - 2 hold - Seated Long Arc Quad  - 1 x daily - 7 x weekly - 3 sets - 10 reps - 2 hold - Sidelying Hip Abduction  - 1 x daily - 7 x weekly - 3 sets - 10 reps - 2 hold - Standing Hip Abduction with Counter Support  - 1 x daily - 7 x weekly - 3 sets - 10 reps - 2 hold - Standing Hip Extension with Counter Support  - 1 x daily - 7 x weekly - 3 sets - 10 reps - 2 hold - Standing Knee Flexion with Unilateral Counter Support  - 1 x daily - 7 x weekly - 3 sets - 10 reps - 2  hold - Sit to Stand with Counter Support  - 1 x daily - 7 x weekly - 3 sets - 10 reps - 2 hold  ASSESSMENT:  CLINICAL IMPRESSION: 12/10/23: Pt completed PT today meeting all set functional. Pt has progressed to Ind ambulation and asc/dsc steps (HR) c a SPC. Pt's quality and pace of gait with the [facility] has improved as well. R knee ext and flexion continue to be markedly limited despite efforts c a manipulation, extensive PT, and ext ROM knee brace. At this time, ROM has plateaued and is not improving. Pt has been advised to contact Dr. Aluisio to see about treatment options. Pt has a final HEP and is to continue to maintain her achieved LOF. Pt is in agreement  with DC from PT services  OBJECTIVE IMPAIRMENTS: decreased activity tolerance, decreased mobility, difficulty walking, decreased ROM, decreased strength, increased edema, obesity, and pain.   ACTIVITY LIMITATIONS: carrying, lifting, bending, sitting, standing, squatting, sleeping, stairs, transfers, bed mobility, bathing, toileting, dressing, locomotion level, and caring for others  PARTICIPATION LIMITATIONS: meal prep, cleaning, laundry, and driving  PERSONAL FACTORS: Age, Fitness, Past/current experiences, Time since onset of injury/illness/exacerbation, and 1 comorbidity: high BMI are also affecting patient's functional outcome.   REHAB POTENTIAL: Good  CLINICAL DECISION MAKING: Stable/uncomplicated  EVALUATION COMPLEXITY: Low   GOALS:  SHORT TERM GOALS: Target date: 08/06/23 Pt will be Ind in an initial HEP  Baseline: started Goal status: MET  2.  Pt will be able to complete a SLR and SAQ with or without quad lag Baseline: not able to complete 07/29/23: able to complete both with limited knee ext ROM present Goal status: MET  3.  Increase R knee AROM to 15-80d for improved R knee function Baseline: 30-50 10/01/23: AAROM 28-90 Goal status: PARTIALLY MET FOR FLEXION  LONG TERM GOALS: Target date: 10/22/23  Pt will be Ind  in a final HEP to maintain achieved LOF Baseline: started Goal status: MET  2.  Increase R hip and knee strength to 4/5 for improved function use of the R LE Baseline: see flow sheets 09/02/23: see flow sheets Goal status: MET  3.  Increased R knee AROM to 5-115d for appropriate fuctional mobility Baseline: 30-50d 09/02/23: 30-85d 10/22/23: 28-86d 11/04/23:  25d lacking 12/10/23: AAROM= -25-90d. Goal status: NOT MET  4.  Improve 5xSTS by MCID of 5 and by MCID of 66ft as indication of improved functional mobility  Baseline: TBA when pt has progressed to gait c a [facility] 08/31/23: 5xSTS=24.7 c arm rest      2MWT=78' c RW 10/15/23:  5xSTS=11.8 s arm rest      2MWT= 185' c RW 12/20/23:210' c SPC Goal status: MET  5.  Pt's FOTO score will improved to the predicted value of 43% as indication of improved function  Baseline: 22% 10/22/23:48% Goal status: MET  6.  Pt will be able to walk 432ft c or s a SPC and ascend/descend 12 steps c or s a SPC and c use of a HR for community mobility Baseline: 27ft c RW 10/22/23: 363ft  12/10/23: 420 c SPC Goal status: MET   PLAN:  PT FREQUENCY: 2x/week  PT DURATION: 6 weeks  PLANNED INTERVENTIONS: 97164- PT Re-evaluation, 97110-Therapeutic exercises, 97530- Therapeutic activity, 97535- Self Care, [id number]- Manual therapy, 97116- Gait training, 97014- Electrical stimulation (unattended), [id number]- Electrical stimulation (manual), 97016- Vasopneumatic device, Stair training, Dry Needling, Joint mobilization, Cryotherapy, and Moist heat  PLAN FOR NEXT SESSION: Review FOTO; assess response to HEP; progress therex as indicated; use of modalities, manual therapy; and TPDN as indicated.   PHYSICAL THERAPY DISCHARGE SUMMARY  Visits from Start of Care: 27  Current functional level related to goals / functional outcomes: See clinical impression and PT goals    Remaining deficits: See clinical impression and PT goals    Education / Equipment: HEP. Pt ED    Patient agrees to discharge. Patient goals were partially met. Patient is being discharged due to being pleased with the current functional level.   Danaja Lasota MS, PT 12/10/23 3:14 PM  Dasie Daft MS, PT 11/02/24 7:48 AM             "

## 2023-12-14 ENCOUNTER — Ambulatory Visit: Admitting: *Deleted

## 2023-12-14 VITALS — BP 133/66 | HR 59 | Temp 98.4°F | Resp 20 | Ht 59.0 in | Wt 155.8 lb

## 2023-12-14 DIAGNOSIS — D509 Iron deficiency anemia, unspecified: Secondary | ICD-10-CM | POA: Diagnosis not present

## 2023-12-14 DIAGNOSIS — D508 Other iron deficiency anemias: Secondary | ICD-10-CM

## 2023-12-14 MED ORDER — IRON SUCROSE 20 MG/ML IV SOLN
200.0000 mg | Freq: Once | INTRAVENOUS | Status: AC
  Administered 2023-12-14: 200 mg via INTRAVENOUS
  Filled 2023-12-14: qty 10

## 2023-12-14 NOTE — Patient Instructions (Signed)

## 2023-12-14 NOTE — Progress Notes (Signed)
 Diagnosis: Iron Deficiency Anemia  Provider:  Chilton Greathouse MD  Procedure: IV Push  IV Type: Peripheral, IV Location: R Antecubital  Venofer (Iron Sucrose), Dose: 200 mg  Post Infusion IV Care: Observation period completed and Peripheral IV Discontinued  Discharge: Condition: Good, Destination: Home . AVS Provided  Performed by:  Forrest Moron, RN

## 2023-12-15 ENCOUNTER — Ambulatory Visit (HOSPITAL_COMMUNITY)
Admission: RE | Admit: 2023-12-15 | Discharge: 2023-12-15 | Disposition: A | Discharge status: Home / Self Care | Source: Ambulatory Visit | Attending: Rheumatology | Admitting: Rheumatology

## 2023-12-15 DIAGNOSIS — M810 Age-related osteoporosis without current pathological fracture: Secondary | ICD-10-CM | POA: Insufficient documentation

## 2023-12-15 MED ORDER — DENOSUMAB 60 MG/ML ~~LOC~~ SOSY: 60.0000 mg | PREFILLED_SYRINGE | Freq: Once | SUBCUTANEOUS | Status: AC

## 2023-12-15 MED ORDER — DENOSUMAB 60 MG/ML ~~LOC~~ SOSY
PREFILLED_SYRINGE | SUBCUTANEOUS | Status: AC
  Administered 2023-12-15: 60 mg via SUBCUTANEOUS
  Filled 2023-12-15: qty 1

## 2023-12-16 ENCOUNTER — Ambulatory Visit

## 2023-12-16 VITALS — BP 129/68 | HR 63 | Temp 97.9°F | Resp 20 | Ht 61.0 in | Wt 155.0 lb

## 2023-12-16 DIAGNOSIS — D509 Iron deficiency anemia, unspecified: Secondary | ICD-10-CM

## 2023-12-16 DIAGNOSIS — D508 Other iron deficiency anemias: Secondary | ICD-10-CM

## 2023-12-16 MED ORDER — IRON SUCROSE 20 MG/ML IV SOLN
200.0000 mg | Freq: Once | INTRAVENOUS | Status: AC
Start: 2023-12-16 — End: 2023-12-16
  Administered 2023-12-16: 200 mg via INTRAVENOUS
  Filled 2023-12-16: qty 10

## 2023-12-16 NOTE — Progress Notes (Signed)
 Diagnosis: Iron Deficiency Anemia  Provider:  Chilton Greathouse MD  Procedure: IV Push  IV Type: Peripheral, IV Location: R Forearm  Venofer (Iron Sucrose), Dose: 200 mg  Post Infusion IV Care: Observation period completed and Peripheral IV Discontinued  Discharge: Condition: Good, Destination: Home . AVS Provided  Performed by:  Loney Hering, LPN

## 2023-12-21 ENCOUNTER — Ambulatory Visit

## 2023-12-21 VITALS — BP 121/65 | HR 61 | Temp 98.3°F | Resp 16 | Ht 59.5 in | Wt 155.6 lb

## 2023-12-21 DIAGNOSIS — D509 Iron deficiency anemia, unspecified: Secondary | ICD-10-CM

## 2023-12-21 DIAGNOSIS — D508 Other iron deficiency anemias: Secondary | ICD-10-CM

## 2023-12-21 MED ORDER — IRON SUCROSE 20 MG/ML IV SOLN
200.0000 mg | Freq: Once | INTRAVENOUS | Status: AC
Start: 2023-12-21 — End: 2023-12-21
  Administered 2023-12-21: 200 mg via INTRAVENOUS
  Filled 2023-12-21: qty 10

## 2023-12-21 NOTE — Progress Notes (Signed)
 Diagnosis: Iron Deficiency Anemia  Provider:  Chilton Greathouse MD  Procedure: IV Push  IV Type: Peripheral, IV Location: R Forearm  Venofer (Iron Sucrose), Dose: 200 mg  Post Infusion IV Care: Observation period completed and Peripheral IV Discontinued. 15 minute observation per patient request.  Discharge: Condition: Stable, Destination: Home . AVS Declined  Performed by:  Wyvonne Lenz, RN

## 2023-12-23 ENCOUNTER — Ambulatory Visit: Admitting: *Deleted

## 2023-12-23 VITALS — BP 128/60 | HR 59 | Temp 98.4°F | Resp 16 | Ht 59.0 in | Wt 154.0 lb

## 2023-12-23 DIAGNOSIS — D509 Iron deficiency anemia, unspecified: Secondary | ICD-10-CM | POA: Diagnosis not present

## 2023-12-23 DIAGNOSIS — D508 Other iron deficiency anemias: Secondary | ICD-10-CM

## 2023-12-23 MED ORDER — IRON SUCROSE 20 MG/ML IV SOLN
200.0000 mg | Freq: Once | INTRAVENOUS | Status: AC
  Administered 2023-12-23: 200 mg via INTRAVENOUS
  Filled 2023-12-23: qty 10

## 2023-12-23 NOTE — Progress Notes (Signed)
 Diagnosis: Iron Deficiency Anemia  Provider:  Chilton Greathouse MD  Procedure: IV Push  IV Type: Peripheral, IV Location: R Antecubital  Venofer (Iron Sucrose), Dose: 200 mg  Post Infusion IV Care: Observation period completed and Peripheral IV Discontinued  Discharge: Condition: Good, Destination: Home . AVS Provided  Performed by:  Forrest Moron, RN

## 2023-12-24 DIAGNOSIS — M6281 Muscle weakness (generalized): Secondary | ICD-10-CM | POA: Diagnosis not present

## 2023-12-24 DIAGNOSIS — M24661 Ankylosis, right knee: Secondary | ICD-10-CM | POA: Diagnosis not present

## 2023-12-24 DIAGNOSIS — R262 Difficulty in walking, not elsewhere classified: Secondary | ICD-10-CM | POA: Diagnosis not present

## 2023-12-27 ENCOUNTER — Ambulatory Visit (INDEPENDENT_AMBULATORY_CARE_PROVIDER_SITE_OTHER)

## 2023-12-27 VITALS — BP 123/74 | HR 60 | Temp 98.3°F | Resp 16 | Ht 59.0 in | Wt 156.0 lb

## 2023-12-27 DIAGNOSIS — D508 Other iron deficiency anemias: Secondary | ICD-10-CM

## 2023-12-27 DIAGNOSIS — D509 Iron deficiency anemia, unspecified: Secondary | ICD-10-CM | POA: Diagnosis not present

## 2023-12-27 MED ORDER — IRON SUCROSE 20 MG/ML IV SOLN
200.0000 mg | Freq: Once | INTRAVENOUS | Status: AC
  Administered 2023-12-27: 200 mg via INTRAVENOUS
  Filled 2023-12-27: qty 10

## 2023-12-27 NOTE — Progress Notes (Signed)
 Diagnosis: Acute Anemia  Provider:  Chilton Greathouse MD  Procedure: IV Push  IV Type: Peripheral, IV Location: R Forearm  Venofer (Iron Sucrose), Dose: 200 mg  Post Infusion IV Care: Observation period completed and Peripheral IV Discontinued  Discharge: Condition: Good, Destination: Home . AVS Provided  Performed by:  Nat Math, RN

## 2023-12-31 ENCOUNTER — Other Ambulatory Visit: Payer: Self-pay

## 2023-12-31 NOTE — Progress Notes (Signed)
 Disenrolled - patient last received Prolia 3.19.25 at hospital infusion center and billed through medical benefit.

## 2024-01-04 ENCOUNTER — Other Ambulatory Visit: Payer: Self-pay | Admitting: Internal Medicine

## 2024-01-05 ENCOUNTER — Telehealth: Payer: Self-pay | Admitting: Pharmacist

## 2024-01-05 NOTE — Telephone Encounter (Signed)
 Patient called stating she received a >$400 bill for Prolia. She states she'd like to consider other treatment options and discuss at OV in August 2025. I did emphasize that we try to keep patients on Prolia long-term but understand if this is  not financially feasible for her. Unfortunately there are no grants open  Chesley Mires, PharmD, MPH, BCPS, CPP Clinical Pharmacist (Rheumatology and Pulmonology)

## 2024-01-10 ENCOUNTER — Other Ambulatory Visit: Payer: Self-pay | Admitting: Rheumatology

## 2024-01-10 DIAGNOSIS — M0579 Rheumatoid arthritis with rheumatoid factor of multiple sites without organ or systems involvement: Secondary | ICD-10-CM

## 2024-01-10 NOTE — Telephone Encounter (Signed)
 Last Fill: 10/13/2023  Eye exam: 12/24/2022 WNL   Labs: 12/01/2023 Hemoglobin remains low and stable.  Protein creatinine ratio is mildly elevated and stable.  Creatinine is mildly elevated and stable.  Complements are normal, sed rate is normal, vitamin D is normal uric acid is normal   Next Visit: 05/11/2024  Last Visit: 12/01/2023  NW:GNFAO systemic lupus erythematosus with other organ involvement   Current Dose per office note 12/01/2023: Plaquenil 200 mg 1 tablet by mouth twice daily Monday through Friday.   Okay to refill Plaquenil?

## 2024-01-11 ENCOUNTER — Telehealth: Payer: Self-pay | Admitting: Cardiovascular Disease

## 2024-01-11 MED ORDER — APIXABAN 5 MG PO TABS: 5.0000 mg | ORAL_TABLET | Freq: Two times a day (BID) | ORAL | 11 refills | Status: AC

## 2024-01-11 MED ORDER — APIXABAN 5 MG PO TABS: 5.0000 mg | ORAL_TABLET | Freq: Two times a day (BID) | ORAL | Status: DC

## 2024-01-11 NOTE — Telephone Encounter (Signed)
 Spoke with patient and she states she is on a fixed income and she dont have the 250 right now. She has enough medication for two more days.

## 2024-01-11 NOTE — Addendum Note (Signed)
 Addended by: Juda Norse on: 01/11/2024 05:42 PM   Modules accepted: Orders

## 2024-01-11 NOTE — Telephone Encounter (Signed)
 Spoke with patient and she will pick up samples. She is ware they are at the front  desk  She states she will try and borrow the money to meet her deductible.  Rx also sent to pharmacy

## 2024-01-11 NOTE — Telephone Encounter (Signed)
 We can give her 2 weeks of samples but after that she will either need to pay deductible or be switched to warfarin.

## 2024-01-11 NOTE — Telephone Encounter (Signed)
 Patient calling the office for samples of medication:   1.  What medication and dosage are you requesting samples for? apixaban (ELIQUIS) 5 MG TABS tablet  2.  Are you currently out of this medication? Yes

## 2024-01-12 ENCOUNTER — Ambulatory Visit: Payer: Medicare HMO | Admitting: Obstetrics and Gynecology

## 2024-01-12 ENCOUNTER — Encounter: Payer: Self-pay | Admitting: Obstetrics and Gynecology

## 2024-01-12 VITALS — BP 122/62 | Temp 98.2°F | Ht 59.0 in | Wt 155.0 lb

## 2024-01-12 DIAGNOSIS — Z9189 Other specified personal risk factors, not elsewhere classified: Secondary | ICD-10-CM

## 2024-01-12 DIAGNOSIS — Z1331 Encounter for screening for depression: Secondary | ICD-10-CM

## 2024-01-12 DIAGNOSIS — Z01419 Encounter for gynecological examination (general) (routine) without abnormal findings: Secondary | ICD-10-CM | POA: Insufficient documentation

## 2024-01-12 NOTE — Assessment & Plan Note (Signed)
 Cervical cancer screening no longer indicated Encouraged annual mammogram screening Colonoscopy UTD DXA UTD Labs and immunizations with her primary Encouraged safe sexual practices as indicated Encouraged healthy lifestyle practices with diet and exercise For patients over 79yo, I recommend 1200mg  calcium daily and 800IU of vitamin D daily.

## 2024-01-12 NOTE — Patient Instructions (Signed)

## 2024-01-12 NOTE — Progress Notes (Signed)
 78 y.o. G1P1 postmenopausal female with A-fib on anticoagulation, osteopenia, rheumatoid arthritis, multiple other comorbidities here for annual exam-high risk Medicare. Single.  Chronic Anemia, seen by Hemato, taking PO Iron. Dermato followed for Vitiligo.  Fam MD Dr Allyne Gee for DM, cHTN, Asthma.   She had a right knee replacement in October 2024 and is still recovering.  This has limited her ability to stay active with walking.  She is using a walker today.  She was also admitted to the hospital December 2024 with upper GI bleed.  She has since been started on a PPI.  She has done well since.  Postmenopausal bleeding: none Pelvic discharge or pain: none Breast mass, nipple discharge or skin changes : none Last PAP: 2020 NIL Last mammogram: 05/12/2023 BI-RADS 1, density A Last DXA: 09/03/2022 T-score -2.4, normal FRAX Last colonoscopy: 09/27/2023 Sexually active: No Exercising: No Smoker: No  Flowsheet Row Office Visit from 01/12/2024 in [facility]  PHQ-2 Total Score 0       Flowsheet Row Clinical Support from 06/30/2023 in [facility]  PHQ-9 Total Score 6        GYN HISTORY: None  OB History  Gravida Para Term Preterm AB Living  1 1    0  SAB IAB Ectopic Multiple Live Births          # Outcome Date GA Lbr Len/2nd Weight Sex Type Anes PTL Lv  1 Para             Obstetric Comments  Baby died from CHF    Past Medical History:  Diagnosis Date   Allergy 25 years ago.   Anemia    Arthritis    Asthma    Atrial fibrillation (HCC)    Cataract Had cataracts surgery 2015 & 2016   Chronic kidney disease    Diabetes mellitus without complication (HCC)    Gout 12/17/2014   patient reported   Heart murmur 4 years ago   Hyperlipidemia    Hypertension    Hypothyroidism    Systemic lupus erythematosus (HCC)    Vitiligo     Past Surgical History:  Procedure Laterality Date   BIOPSY  09/26/2023    Procedure: BIOPSY;  Surgeon: Meryl Dare, MD;  Location: [facility];  Service: Gastroenterology;;   CATARACT EXTRACTION Bilateral 2015   COLONOSCOPY WITH PROPOFOL N/A 09/27/2023   Procedure: COLONOSCOPY WITH PROPOFOL;  Surgeon: [name], MD;  Location: [facility];  Service: Gastroenterology;  Laterality: N/A;   DILATION AND CURETTAGE OF UTERUS     DOPPLER ECHOCARDIOGRAPHY  05/2018   Internist to review with pt; potential heart murmur 06/20/18   ESOPHAGOGASTRODUODENOSCOPY (EGD) WITH PROPOFOL N/A 09/26/2023   Procedure: ESOPHAGOGASTRODUODENOSCOPY (EGD) WITH PROPOFOL;  Surgeon: Meryl Dare, MD;  Location: [facility];  Service: Gastroenterology;  Laterality: N/A;   EYE SURGERY  Cataract Surgery, 2015 & 2016.   keratosis removal  2021   KNEE CLOSED REDUCTION Left 07/06/2022   Procedure: CLOSED MANIPULATION KNEE;  Surgeon: Ollen Gross, MD;  Location: [facility];  Service: Orthopedics;  Laterality: Left;   KNEE CLOSED REDUCTION Right 09/13/2023   Procedure: CLOSED MANIPULATION KNEE;  Surgeon: Ollen Gross, MD;  Location: [facility];  Service: Orthopedics;  Laterality: Right;   REPLACEMENT TOTAL KNEE Left    SKIN SURGERY  11/30/2018   left side of face   TOOTH EXTRACTION     TOTAL KNEE ARTHROPLASTY Left 02/02/2022   Procedure: TOTAL KNEE  ARTHROPLASTY;  Surgeon: Liliane Rei, MD;  Location: [facility];  Service: Orthopedics;  Laterality: Left;   TOTAL KNEE ARTHROPLASTY Right 07/12/2023   Procedure: RIGHT TOTAL KNEE ARTHROPLASTY;  Surgeon: Liliane Rei, MD;  Location: [facility];  Service: Orthopedics;  Laterality: Right;    Current Outpatient Medications on File Prior to Visit  Medication Sig Dispense Refill   Accu-Chek Softclix Lancets lancets Use to check blood sugars daily E11.69 100 each 2   albuterol (PROVENTIL HFA;VENTOLIN HFA) 108 (90 Base) MCG/ACT inhaler Inhale 2 puffs into the lungs every 6 (six) hours as needed for wheezing or shortness of breath. 1 Inhaler 2   Alcohol  Swabs (ALCOHOL PADS) 70 % PADS Use as directed to check blood sugars 1 time per day dx: e11.22 150 each 2   allopurinol (ZYLOPRIM) 100 MG tablet Take 1 tablet (100 mg total) by mouth 3 (three) times a week for 90 doses. (Patient taking differently: Take 100 mg by mouth every Monday, Wednesday, and Friday.) 90 tablet 2   amLODipine (NORVASC) 5 MG tablet Take 1 tablet (5 mg total) by mouth at bedtime. 90 tablet 2   apixaban (ELIQUIS) 5 MG TABS tablet Take 1 tablet (5 mg total) by mouth 2 (two) times daily. 28 tablet    apixaban (ELIQUIS) 5 MG TABS tablet Take 1 tablet (5 mg total) by mouth 2 (two) times daily. 60 tablet 11   Ascorbic Acid (VITAMIN C PO) Take 500 mg by mouth daily with lunch.     atorvastatin (LIPITOR) 80 MG tablet TAKE 1 TABLET BY MOUTH EVERY DAY 90 tablet 2   B Complex-C (B-COMPLEX WITH VITAMIN C) tablet Take 1 tablet by mouth daily with lunch.     budesonide-formoterol (SYMBICORT) 160-4.5 MCG/ACT inhaler INHALE 2 PUFFS BY MOUTH TWICE DAILY IN THE MORNING AND IN THE EVENING (Patient taking differently: Inhale 2 puffs into the lungs 2 (two) times daily as needed (Asthma).) 10.2 g 6   Calcium Carb-Cholecalciferol 600-800 MG-UNIT TABS Take 1 tablet by mouth daily with lunch.     Cholecalciferol (VITAMIN D PO) Take 2,000 Units by mouth daily with lunch.     dapagliflozin propanediol (FARXIGA) 10 MG TABS tablet Take 1 tablet (10 mg total) by mouth daily before breakfast. 90 tablet 3   glucose blood (ACCU-CHEK GUIDE) test strip USE TO CHECK BLOOD SUGAR DAILY 100 strip 2   hydroxychloroquine (PLAQUENIL) 200 MG tablet TAKE 1 TABLET BY MOUTH TWICE DAILY MONDAY-FRIDAY ONLY 120 tablet 0   irbesartan (AVAPRO) 300 MG tablet TAKE 1 TABLET(300 MG) BY MOUTH DAILY 90 tablet 2   metoprolol succinate (TOPROL-XL) 25 MG 24 hr tablet TAKE 1 TABLET(25 MG) BY MOUTH DAILY (Patient taking differently: Take 25 mg by mouth daily.) 90 tablet 3   Multiple Vitamin (MULTIVITAMIN WITH MINERALS) TABS tablet Take 1  tablet by mouth daily with lunch. Women's Multivitamin     Polyethyl Glycol-Propyl Glycol (SYSTANE) 0.4-0.3 % SOLN Place 1-2 drops into both eyes 3 (three) times daily as needed (dry/irritated eyes.).     SYNTHROID 88 MCG tablet TAKE 1 TABLET BY MOUTH EVERY DAY MONDAY TO SATURDAY AND OFF ON SUNDAYS (Patient taking differently: Take 44-88 mcg by mouth See admin instructions. TAKE 88 mcg BY MOUTH EVERY DAY MONDAY TO SATURDAY AND 44 mg on Sunday in the morning) 90 tablet 0   denosumab (PROLIA) 60 MG/ML SOSY injection Inject 60 mg into the skin every 6 (six) months. Courier to rheum: [address], [place] [address]. Appt  on 12/07/2022 1 mL 0   pantoprazole (PROTONIX) 40 MG tablet Take 1 tablet (40 mg total) by mouth 2 (two) times daily for 30 days, THEN 1 tablet (40 mg total) daily. 120 tablet 0   No current facility-administered medications on file prior to visit.    Social History   Socioeconomic History   Marital status: Single    Spouse name: Not on file   Number of children: 0   Years of education: Not on file   Highest education level: Bachelor's degree (e.g., BA, AB, BS)  Occupational History   Occupation: retired  Tobacco Use   Smoking status: Never    Passive exposure: Never   Smokeless tobacco: Never  Vaping Use   Vaping status: Never Used  Substance and Sexual Activity   Alcohol use: Not Currently    Comment: Maybe a glass of wine every 3 months.   Drug use: No   Sexual activity: Not Currently    Birth control/protection: None    Comment: 1ST intercourse- 19 , partners- 6,   Other Topics Concern   Not on file  Social History Narrative   Not on file   Social Drivers of Health   Financial Resource Strain: Low Risk  (11/04/2023)   Overall Financial Resource Strain (CARDIA)    Difficulty of Paying Living Expenses: Not hard at all  Food Insecurity: No Food Insecurity (11/04/2023)   Hunger Vital Sign    Worried About Running Out of Food in the Last Year: Never  true    Ran Out of Food in the Last Year: Never true  Transportation Needs: No Transportation Needs (11/04/2023)   PRAPARE - Administrator, Civil Service (Medical): No    Lack of Transportation (Non-Medical): No  Physical Activity: Insufficiently Active (11/04/2023)   Exercise Vital Sign    Days of Exercise per Week: 3 days    Minutes of Exercise per Session: 40 min  Stress: No Stress Concern Present (11/04/2023)   [employer] of Occupational Health - Occupational Stress Questionnaire    Feeling of Stress : Only a little  Social Connections: Moderately Integrated (11/04/2023)   Social Connection and Isolation Panel [NHANES]    Frequency of Communication with Friends and Family: More than three times a week    Frequency of Social Gatherings with Friends and Family: More than three times a week    Attends Religious Services: More than 4 times per year    Active Member of [employer] or Organizations: Yes    Attends Banker Meetings: More than 4 times per year    Marital Status: Never married  Intimate Partner Violence: Not At Risk (09/25/2023)   Humiliation, Afraid, Rape, and Kick questionnaire    Fear of Current or Ex-Partner: No    Emotionally Abused: No    Physically Abused: No    Sexually Abused: No    Family History  Problem Relation Age of Onset   Heart disease Father    Diabetes Father    Hypertension Mother    Obesity Mother    Varicose Veins Mother    Hypertension Sister    Hypertension Sister    Leukemia Sister    Cancer Sister    Obesity Sister     Allergies  Allergen Reactions   Shellfish Allergy Anaphylaxis and Hives      PE Today's Vitals   01/12/24 1125  BP: 122/62  Temp: 98.2 F (36.8 C)  TempSrc: Oral  Weight: 155 lb (70.3  kg)  Height: 4\' 11"  (1.499 m)   Body mass index is 31.31 kg/m.  Physical Exam Vitals reviewed. Exam conducted with a chaperone present.  Constitutional:      General: She is not in acute distress.     Appearance: Normal appearance.  HENT:     Head: Normocephalic and atraumatic.     Nose: Nose normal.  Eyes:     Extraocular Movements: Extraocular movements intact.     Conjunctiva/sclera: Conjunctivae normal.  Neck:     Thyroid: No thyroid mass, thyromegaly or thyroid tenderness.  Pulmonary:     Effort: Pulmonary effort is normal.  Chest:     Chest wall: No mass or tenderness.  Breasts:    Right: Normal. No swelling, mass, nipple discharge, skin change or tenderness.     Left: Normal. No swelling, mass, nipple discharge, skin change or tenderness.  Abdominal:     General: There is no distension.     Palpations: Abdomen is soft.     Tenderness: There is no abdominal tenderness.  Genitourinary:    General: Normal vulva.     Exam position: Lithotomy position.     Urethra: No prolapse.     Vagina: Normal. No vaginal discharge or bleeding.     Cervix: Normal. No lesion.     Uterus: Normal. Not enlarged and not tender.      Adnexa: Right adnexa normal and left adnexa normal.  Musculoskeletal:        General: Normal range of motion.     Cervical back: Normal range of motion.  Lymphadenopathy:     Upper Body:     Right upper body: No axillary adenopathy.     Left upper body: No axillary adenopathy.     Lower Body: No right inguinal adenopathy. No left inguinal adenopathy.  Skin:    General: Skin is warm and dry.  Neurological:     General: No focal deficit present.     Mental Status: She is alert.  Psychiatric:        Mood and Affect: Mood normal.        Behavior: Behavior normal.      Assessment and Plan:        Encounter for breast and pelvic examination Assessment & Plan: Cervical cancer screening no longer indicated Encouraged annual mammogram screening Colonoscopy UTD DXA UTD Labs and immunizations with her primary Encouraged safe sexual practices as indicated Encouraged healthy lifestyle practices with diet and exercise For patients over 70yo, I recommend  1200mg  calcium daily and 800IU of vitamin D daily.    Other specified personal risk factors, not elsewhere classified  Negative depression screening   Romaine Closs, MD

## 2024-01-24 DIAGNOSIS — M24661 Ankylosis, right knee: Secondary | ICD-10-CM | POA: Diagnosis not present

## 2024-01-24 DIAGNOSIS — M6281 Muscle weakness (generalized): Secondary | ICD-10-CM | POA: Diagnosis not present

## 2024-01-24 DIAGNOSIS — R262 Difficulty in walking, not elsewhere classified: Secondary | ICD-10-CM | POA: Diagnosis not present

## 2024-01-28 DIAGNOSIS — Z5189 Encounter for other specified aftercare: Secondary | ICD-10-CM | POA: Diagnosis not present

## 2024-01-28 DIAGNOSIS — M24661 Ankylosis, right knee: Secondary | ICD-10-CM | POA: Diagnosis not present

## 2024-02-04 ENCOUNTER — Other Ambulatory Visit: Payer: Self-pay | Admitting: Internal Medicine

## 2024-02-08 ENCOUNTER — Ambulatory Visit: Payer: Medicare HMO | Admitting: Internal Medicine

## 2024-02-08 DIAGNOSIS — Z96651 Presence of right artificial knee joint: Secondary | ICD-10-CM | POA: Diagnosis not present

## 2024-02-08 DIAGNOSIS — M24661 Ankylosis, right knee: Secondary | ICD-10-CM | POA: Diagnosis not present

## 2024-02-09 DIAGNOSIS — Z96651 Presence of right artificial knee joint: Secondary | ICD-10-CM | POA: Diagnosis not present

## 2024-02-09 DIAGNOSIS — M25661 Stiffness of right knee, not elsewhere classified: Secondary | ICD-10-CM | POA: Diagnosis not present

## 2024-02-10 ENCOUNTER — Ambulatory Visit: Payer: Self-pay

## 2024-02-10 VITALS — BP 124/69 | HR 67

## 2024-02-10 DIAGNOSIS — I10 Essential (primary) hypertension: Secondary | ICD-10-CM

## 2024-02-10 DIAGNOSIS — Z96651 Presence of right artificial knee joint: Secondary | ICD-10-CM

## 2024-02-10 DIAGNOSIS — E039 Hypothyroidism, unspecified: Secondary | ICD-10-CM

## 2024-02-10 DIAGNOSIS — M069 Rheumatoid arthritis, unspecified: Secondary | ICD-10-CM

## 2024-02-10 NOTE — Patient Outreach (Signed)
 Complex Care Management   Visit Note  02/10/2024  Name:  Hannah [name] MRN: [id number] DOB: 1946/02/01  Situation: Referral received for Complex Care Management related to RA, OA, HTN, Hypothyroidism, s/p right TKA. I obtained verbal consent from Patient.  Visit completed with patient on the phone.  Background:   Past Medical History:  Diagnosis Date   Allergy  25 years ago.   Anemia    Arthritis    Asthma    Atrial fibrillation (HCC)    Cataract Had cataracts surgery 2015 & 2016   Chronic kidney disease    Diabetes mellitus without complication (HCC)    Gout 12/17/2014   patient reported   Heart murmur 4 years ago   Hyperlipidemia    Hypertension    Hypothyroidism    Systemic lupus erythematosus (HCC)    Vitiligo     Assessment: Patient Reported Symptoms:  Cognitive Cognitive Status: Alert and oriented to person, place, and time Cognitive/Intellectual Conditions Management [RPT]: None reported or documented in medical history or problem list   Health Maintenance Behaviors: Annual physical exam, Healthy diet, Exercise, Immunizations, Spiritual practice(s) Healing Pattern: Slow Health Facilitated by: Pain control, Healthy diet, Prayer/meditation, Rest  Neurological Neurological Review of Symptoms: No symptoms reported    HEENT HEENT Symptoms Reported: Runny nose HEENT Management Strategies: Routine screening HEENT Self-Management Outcome: 4 (good)    Cardiovascular Cardiovascular Symptoms Reported: No symptoms reported Does patient have uncontrolled Hypertension?: No Cardiovascular Conditions: Dysrhythmia, Hypertension Cardiovascular Management Strategies: Medication therapy, Routine screening Cardiovascular Self-Management Outcome: 4 (good)  Respiratory Respiratory Symptoms Reported: No symptoms reported Respiratory Conditions: Asthma Respiratory Self-Management Outcome: 4 (good)  Endocrine Patient reports the following symptoms related to hypoglycemia or  hyperglycemia : No symptoms reported Endocrine Conditions: Thyroid  disorder, Osteoporosis Endocrine Management Strategies: Routine screening, Medication therapy Endocrine Self-Management Outcome: 4 (good)  Gastrointestinal Gastrointestinal Symptoms Reported: No symptoms reported   Nutrition Risk Screen (CP): No indicators present  Genitourinary Genitourinary Symptoms Reported: No symptoms reported    Integumentary Integumentary Symptoms Reported: Bruising Skin Management Strategies: Routine screening Skin Self-Management Outcome: 4 (good)  Musculoskeletal Musculoskelatal Symptoms Reviewed: Difficulty walking, Unsteady gait Additional Musculoskeletal Details: s/p TKA with 2 manipulation procedure Musculoskeletal Conditions: Mobility limited, Unsteady gait, Rheumatoid arthritis, Osteoarthritis, Osteoporosis Musculoskeletal Management Strategies: Routine screening, Exercise Musculoskeletal Self-Management Outcome: 3 (uncertain)      Psychosocial Psychosocial Symptoms Reported: No symptoms reported   Major Change/Loss/Stressor/Fears (CP): Medical condition, self Techniques to Cope with Loss/Stress/Change: Spiritual practice(s), Diversional activities Quality of Family Relationships: helpful, involved, supportive Do you feel physically threatened by others?: No      02/10/2024    9:23 AM  Depression screen PHQ 2/9  Decreased Interest 0  Down, Depressed, Hopeless 0  PHQ - 2 Score 0    Vitals:   02/10/24 0927  BP: 124/69  Pulse: 67    Medications Reviewed Today     Reviewed by Kaylene Pascal, RN (Registered Nurse) on 02/10/24 at [phone]  Med List Status: <None>   Medication Order Taking? Sig Documenting Provider Last Dose Status Informant  Accu-Chek Softclix Lancets lancets [id number]  Use to check blood sugars daily E11.69 Cleave Curling, MD  Active Self  albuterol  (PROVENTIL  HFA;VENTOLIN  HFA) 108 (90 Base) MCG/ACT inhaler [id number] Yes Inhale 2 puffs into the lungs every 6 (six)  hours as needed for wheezing or shortness of breath. Susanna Epley, FNP Taking Active Self           Med Note (Other Atienza, Orrie Blake Feb 10, 2024  9:36 AM)    Alcohol  Swabs (ALCOHOL  PADS) 70 % PADS [id number]  Use as directed to check blood sugars 1 time per day dx: e11.22 Cleave Curling, MD  Active Self  allopurinol  (ZYLOPRIM ) 100 MG tablet [id number] Yes TAKE 1 TABLET BY MOUTH 3 TIMES A WEEK Cleave Curling, MD Taking Active   amLODipine  (NORVASC ) 5 MG tablet [id number] Yes Take 1 tablet (5 mg total) by mouth at bedtime. Cleave Curling, MD Taking Active Self  apixaban  (ELIQUIS ) 5 MG TABS tablet [id number]  Take 1 tablet (5 mg total) by mouth 2 (two) times daily. Nahser, Lela Purple, MD  Consider Medication Status and Discontinue (Change in therapy)   apixaban  (ELIQUIS ) 5 MG TABS tablet [id number] Yes Take 1 tablet (5 mg total) by mouth 2 (two) times daily. Nahser, Lela Purple, MD Taking Active   Ascorbic Acid (VITAMIN C PO) [id number] Yes Take 500 mg by mouth daily with lunch. [provider] Taking Active Self  atorvastatin  (LIPITOR) 80 MG tablet [id number] Yes TAKE 1 TABLET BY MOUTH EVERY DAY Cleave Curling, MD Taking Active Self  B Complex-C (B-COMPLEX WITH VITAMIN C) tablet [id number] Yes Take 1 tablet by mouth daily with lunch. [provider] Taking Active Self  budesonide -formoterol  (SYMBICORT ) 160-4.5 MCG/ACT inhaler [id number] Yes INHALE 2 PUFFS BY MOUTH TWICE DAILY IN THE MORNING AND IN THE EVENING  Patient taking differently: Inhale 2 puffs into the lungs 2 (two) times daily as needed (Asthma).   Cleave Curling, MD Taking Active Self  Calcium  Carb-Cholecalciferol 600-800 MG-UNIT TABS [id number] Yes Take 1 tablet by mouth daily with lunch. [provider] Taking Active Self  Cholecalciferol (VITAMIN D  PO) [id number] Yes Take 2,000 Units by mouth daily with lunch. [provider] Taking Active Self           Med Note (Aliana Kreischer L   Thu Feb 10, 2024  9:35 AM)     dapagliflozin  propanediol (FARXIGA ) 10 MG TABS tablet [id number] Yes Take 1 tablet (10 mg total) by mouth daily before breakfast. Cleave Curling, MD Taking Active   denosumab  (PROLIA ) 60 MG/ML SOSY injection [id number] Yes Inject 60 mg into the skin every 6 (six) months. Courier to rheum: [address], [place] [address]. Appt on 12/07/2022 Nicholas Bari, MD Taking Active Self           Med Note Henrene Locust Sep 09, 2023 11:14 AM)    glucose blood (ACCU-CHEK GUIDE) test strip [id number]  USE TO CHECK BLOOD SUGAR DAILY Cleave Curling, MD  Active Self  hydroxychloroquine  (PLAQUENIL ) 200 MG tablet [id number] Yes TAKE 1 TABLET BY MOUTH TWICE DAILY MONDAY-FRIDAY ONLY Romayne Clubs, PA-C Taking Active   irbesartan  (AVAPRO ) 300 MG tablet [id number] Yes TAKE 1 TABLET(300 MG) BY MOUTH DAILY Cleave Curling, MD Taking Active   methocarbamol  (ROBAXIN ) 500 MG tablet [id number] Yes Take 500 mg by mouth every 6 (six) hours as needed for muscle spasms. [provider] Taking Active   metoprolol  succinate (TOPROL -XL) 25 MG 24 hr tablet [id number] Yes TAKE 1 TABLET(25 MG) BY MOUTH DAILY  Patient taking differently: Take 25 mg by mouth daily.   Nahser, Lela Purple, MD Taking Active Self  Multiple Vitamin (MULTIVITAMIN WITH MINERALS) TABS tablet [id number] Yes Take 1 tablet by mouth daily with lunch. Women's Multivitamin [provider] Taking Active Self  oxycodone  (OXY-IR) 5 MG capsule [id number] Yes Take 5 mg by mouth every 6 (six) hours as needed for pain. [provider] Taking Active   pantoprazole  (PROTONIX ) 40 MG tablet [id number] Yes Take 1 tablet (40 mg total) by mouth 2 (two) times daily for 30 days, THEN 1 tablet (40 mg total) daily. Gonfa, Taye T, MD Taking Active   Polyethyl Glycol-Propyl Glycol (SYSTANE) 0.4-0.3 % SOLN [id number] Yes Place 1-2 drops into both eyes 3 (three) times daily as needed (dry/irritated eyes.). [provider] Taking Active Self   SYNTHROID  88 MCG tablet [id number] Yes TAKE 1 TABLET BY MOUTH EVERY DAY MONDAY TO SATURDAY AND OFF ON SUNDAYS  Patient taking differently: Take 44-88 mcg by mouth See admin instructions. TAKE 88 mcg BY MOUTH EVERY DAY MONDAY TO SATURDAY AND 44 mg on Sunday in the morning   Cleave Curling, MD Taking Active Self            Recommendation:   PCP Follow-up with Dr. Elnita Hai on Thursday, June 5 at 2:20 PM Specialty provider follow-up with outpatient PT as directed   Follow Up Plan:   Telephone follow up appointment date/time:  Thursday, June 12 at 12:30 PM Referral to Care Guide  Louanne Roussel RN BSN CCM [facility]  [facility], [facility] Nurse Care Coordinator  Direct Dial: [phone] Website: [email or web address]

## 2024-02-10 NOTE — Patient Instructions (Signed)
 Visit Information  Thank you for taking time to visit with me today. Please don't hesitate to contact me if I can be of assistance to you before our next scheduled appointment.  Our next appointment is by telephone on Thursday, June 12 at 12:30 PM Please call the care guide team at [phone] if you need to cancel or reschedule your appointment.   Following is a copy of your care plan:   Goals Addressed             This Visit's Progress    COMPLETED: To get better BP management       Care Coordination Interventions: Evaluation of current treatment plan related to hypertension self management and patient's adherence to plan as established by provider Review of patient status, including review of consultant's reports, relevant laboratory and other test results Reviewed medications with patient and completed medication reconciliation  Advised patient, providing education and rationale, to monitor blood pressure daily and record, calling PCP for findings outside established parameters  Hypertension Interventions: Last practice recorded BP readings:  BP Readings from Last 3 Encounters:  02/10/24 124/69  01/12/24 122/62  12/27/23 123/74   Most recent eGFR/CrCl:  Lab Results  Component Value Date   EGFR 45 (L) 12/01/2023    No components found for: "CRCL"        VBCI RN Care Plan related to impaired physical mobility secondary to s/p right TKA       Problems:  Chronic Disease Management support and education needs related to impaired physical mobility secondary to right TKA requiring at least 2 manipulations   Goal: Over the next 90 days the Patient will continue to work with Medical illustrator and/or Social Worker to address care management and care coordination needs related to impaired physical mobility s/p right TKA as evidenced by adherence to care management team scheduled appointments      Interventions:   Pain Interventions: Pain assessment performed Medications  reviewed Reviewed provider established plan for pain management Counseled on the importance of reporting any/all new or changed pain symptoms or management strategies to pain management provider Advised patient to report to care team affect of pain on daily activities Reviewed with patient prescribed pharmacological and nonpharmacological pain relief strategies  Patient Self-Care Activities:  Attend all scheduled provider appointments Call pharmacy for medication refills 3-7 days in advance of running out of medications Call provider office for new concerns or questions  Take medications as prescribed   Work with the nurse care manager to address care coordination needs and will continue to work with the clinical team to address health care and disease management related needs  Plan:  Follow up with provider re: impaired physical mobility, s/p right TKA Next PCP appointment scheduled for: Thursday, June 5 at 2:20 PM Telephone follow up appointment with care management team member scheduled for:  Thursday, June 12 at 12:30 PM             Please call [phone]-TALK (toll free, 24 hour hotline) if you are experiencing a Mental Health or Behavioral Health Crisis or need someone to talk to.  Patient verbalizes understanding of instructions and care plan provided today and agrees to view in MyChart. Active MyChart status and patient understanding of how to access instructions and care plan via MyChart confirmed with patient.     Louanne Roussel RN BSN CCM [place]  [facility], [facility] Nurse Care Coordinator  Direct Dial: [phone] Website: [email or web address]

## 2024-02-11 DIAGNOSIS — Z96651 Presence of right artificial knee joint: Secondary | ICD-10-CM | POA: Diagnosis not present

## 2024-02-11 DIAGNOSIS — M25661 Stiffness of right knee, not elsewhere classified: Secondary | ICD-10-CM | POA: Diagnosis not present

## 2024-02-14 DIAGNOSIS — Z96651 Presence of right artificial knee joint: Secondary | ICD-10-CM | POA: Diagnosis not present

## 2024-02-14 DIAGNOSIS — M25661 Stiffness of right knee, not elsewhere classified: Secondary | ICD-10-CM | POA: Diagnosis not present

## 2024-02-16 DIAGNOSIS — H04123 Dry eye syndrome of bilateral lacrimal glands: Secondary | ICD-10-CM | POA: Diagnosis not present

## 2024-02-16 DIAGNOSIS — Z79899 Other long term (current) drug therapy: Secondary | ICD-10-CM | POA: Diagnosis not present

## 2024-02-16 DIAGNOSIS — Z961 Presence of intraocular lens: Secondary | ICD-10-CM | POA: Diagnosis not present

## 2024-02-16 DIAGNOSIS — E119 Type 2 diabetes mellitus without complications: Secondary | ICD-10-CM | POA: Diagnosis not present

## 2024-02-16 DIAGNOSIS — H43813 Vitreous degeneration, bilateral: Secondary | ICD-10-CM | POA: Diagnosis not present

## 2024-02-16 DIAGNOSIS — M329 Systemic lupus erythematosus, unspecified: Secondary | ICD-10-CM | POA: Diagnosis not present

## 2024-02-16 DIAGNOSIS — H26493 Other secondary cataract, bilateral: Secondary | ICD-10-CM | POA: Diagnosis not present

## 2024-02-16 LAB — HM DIABETES EYE EXAM

## 2024-02-18 DIAGNOSIS — Z96651 Presence of right artificial knee joint: Secondary | ICD-10-CM | POA: Diagnosis not present

## 2024-02-18 DIAGNOSIS — M25661 Stiffness of right knee, not elsewhere classified: Secondary | ICD-10-CM | POA: Diagnosis not present

## 2024-02-23 ENCOUNTER — Telehealth: Payer: Self-pay | Admitting: *Deleted

## 2024-02-23 DIAGNOSIS — M25661 Stiffness of right knee, not elsewhere classified: Secondary | ICD-10-CM | POA: Diagnosis not present

## 2024-02-23 DIAGNOSIS — Z96651 Presence of right artificial knee joint: Secondary | ICD-10-CM | POA: Diagnosis not present

## 2024-02-23 NOTE — Progress Notes (Signed)
 Complex Care Management Care Guide Note  02/23/2024 Name: Hannah [name] MRN: [id number] DOB: [birth year]  Hannah [name] is a 78 y.o. year old female who is a primary care patient of Cleave Curling, MD and is actively engaged with the care management team. I reached out to Brittanie M Mairena by phone today to assist with scheduling  with the BSW.  Follow up plan: Telephone appointment with complex care management team member scheduled for:  03/02/24  Barnie Bora  [facility]  [facility], [facility] Guide  Direct Dial: [phone]  Fax [phone]

## 2024-02-24 DIAGNOSIS — M25661 Stiffness of right knee, not elsewhere classified: Secondary | ICD-10-CM | POA: Diagnosis not present

## 2024-02-24 DIAGNOSIS — Z96651 Presence of right artificial knee joint: Secondary | ICD-10-CM | POA: Diagnosis not present

## 2024-02-25 DIAGNOSIS — M25661 Stiffness of right knee, not elsewhere classified: Secondary | ICD-10-CM | POA: Diagnosis not present

## 2024-02-25 DIAGNOSIS — Z96651 Presence of right artificial knee joint: Secondary | ICD-10-CM | POA: Diagnosis not present

## 2024-02-28 DIAGNOSIS — Z96651 Presence of right artificial knee joint: Secondary | ICD-10-CM | POA: Diagnosis not present

## 2024-02-28 DIAGNOSIS — M25661 Stiffness of right knee, not elsewhere classified: Secondary | ICD-10-CM | POA: Diagnosis not present

## 2024-03-02 ENCOUNTER — Ambulatory Visit: Admitting: Internal Medicine

## 2024-03-02 ENCOUNTER — Other Ambulatory Visit: Payer: Self-pay | Admitting: Licensed Clinical Social Worker

## 2024-03-02 NOTE — Patient Outreach (Signed)
 Complex Care Management   Visit Note  03/02/2024  Name:  Hannah [name] MRN: [id number] DOB: 16-Nov-1945  Situation: Referral received for Complex Care Management related to SDOH Barriers:  Financial Resource Strain I obtained verbal consent from Patient.  Visit completed with patient  on the phone  Background:   Past Medical History:  Diagnosis Date   Allergy  25 years ago.   Anemia    Arthritis    Asthma    Atrial fibrillation (HCC)    Cataract Had cataracts surgery 2015 & 2016   Chronic kidney disease    Diabetes mellitus without complication (HCC)    Gout 12/17/2014   patient reported   Heart murmur 4 years ago   Hyperlipidemia    Hypertension    Hypothyroidism    Systemic lupus erythematosus (HCC)    Vitiligo     Assessment:Patient stated that she has everything under control and does not have any bills yet.. SW educated the patient on Nurse, adult Counseling and the contact [facility] if neded   SDOH Interventions    Flowsheet Row Patient Outreach Telephone from 03/02/2024 in [facility] Care Coordination from 02/10/2024 in [facility]PART[facility] ED to Hosp-Admission (Discharged) from 09/23/2023 in [facility] Clinical Support from 06/30/2023 in [facility] Chronic Care Management from 03/10/2022 in [facility] Office Visit from 06/25/2021 in [facility]  SDOH Interventions        Food Insecurity Interventions Intervention Not Indicated Intervention Not Indicated Intervention Not Indicated, Inpatient TOC Intervention Not Indicated -- --  Housing Interventions Intervention Not Indicated Intervention Not Indicated Intervention Not Indicated, Inpatient TOC Intervention Not Indicated -- Intervention Not Indicated  Transportation Interventions Intervention Not Indicated Intervention Not  Indicated Intervention Not Indicated, Inpatient TOC Intervention Not Indicated -- --  Utilities Interventions Intervention Not Indicated Intervention Not Indicated Intervention Not Indicated, Inpatient TOC Intervention Not Indicated -- --  Alcohol  Usage Interventions -- -- -- Intervention Not Indicated (Score <7) -- --  Financial Strain Interventions Intervention Not Indicated Intervention Not Indicated -- Intervention Not Indicated -- --  Physical Activity Interventions -- Intervention Not Indicated -- Intervention Not Indicated --  [Physical Therapy for knee replacement surgery] --  Stress Interventions -- Intervention Not Indicated -- Intervention Not Indicated -- --  Social Connections Interventions -- Intervention Not Indicated -- Intervention Not Indicated -- Intervention Not Indicated  Health Literacy Interventions -- Intervention Not Indicated -- Intervention Not Indicated -- --         Recommendation:   none  Follow Up Plan:   Closing From:  Complex Care Management  Jonda Neighbours, PhD [facility], [facility] Social Worker Direct Dial: [phone]  Fax: [phone]

## 2024-03-02 NOTE — Patient Instructions (Signed)
 Visit Information  Thank you for taking time to visit with me today. Please don't hesitate to contact me if I can be of assistance to you before our next scheduled appointment.  Our next appointment is no further scheduled appointments.   Please call the care guide team at [phone] if you need to cancel or reschedule your appointment.   Following is a copy of your care plan:   Goals Addressed             This Visit's Progress    BSW VBC[facility] Social Work Care Plan       Problems:   Financial Strain patient stated that she does not have any bills as of yet but she can handle it and if she needs assistance she will reach out to the SW  CSW Clinical Goal(s):   Patient does not want a follow up   Interventions:  None SW educated the patient on Nurse, adult Counseling and the contact [facility] if needed  Patient Goals/Self-Care Activities:  None SW educated the patient on Nurse, adult Counseling and the contact [facility] if neded  Plan:   No further follow up required: patietn stated that she has it under control         Please call the Suicide and Crisis Lifeline: 988 go to [facility] [address], [place] [phone]) call 911 if you are experiencing a Mental Health or Behavioral Health Crisis or need someone to talk to.  Patient verbalizes understanding of instructions and care plan provided today and agrees to view in MyChart. Active MyChart status and patient understanding of how to access instructions and care plan via MyChart confirmed with patient.     Jonda Neighbours, PhD [facility], [facility] Social Worker Direct Dial: [phone]  Fax: [phone]

## 2024-03-03 DIAGNOSIS — M25661 Stiffness of right knee, not elsewhere classified: Secondary | ICD-10-CM | POA: Diagnosis not present

## 2024-03-03 DIAGNOSIS — Z96651 Presence of right artificial knee joint: Secondary | ICD-10-CM | POA: Diagnosis not present

## 2024-03-06 DIAGNOSIS — M25661 Stiffness of right knee, not elsewhere classified: Secondary | ICD-10-CM | POA: Diagnosis not present

## 2024-03-06 DIAGNOSIS — Z96651 Presence of right artificial knee joint: Secondary | ICD-10-CM | POA: Diagnosis not present

## 2024-03-08 DIAGNOSIS — M25661 Stiffness of right knee, not elsewhere classified: Secondary | ICD-10-CM | POA: Diagnosis not present

## 2024-03-08 DIAGNOSIS — Z96651 Presence of right artificial knee joint: Secondary | ICD-10-CM | POA: Diagnosis not present

## 2024-03-09 ENCOUNTER — Telehealth

## 2024-03-13 DIAGNOSIS — Z96651 Presence of right artificial knee joint: Secondary | ICD-10-CM | POA: Diagnosis not present

## 2024-03-13 DIAGNOSIS — M25661 Stiffness of right knee, not elsewhere classified: Secondary | ICD-10-CM | POA: Diagnosis not present

## 2024-03-15 ENCOUNTER — Encounter: Payer: Self-pay | Admitting: Internal Medicine

## 2024-03-15 ENCOUNTER — Ambulatory Visit (INDEPENDENT_AMBULATORY_CARE_PROVIDER_SITE_OTHER): Admitting: Internal Medicine

## 2024-03-15 VITALS — BP 130/70 | HR 77 | Temp 98.2°F | Ht 59.0 in | Wt 153.8 lb

## 2024-03-15 DIAGNOSIS — L608 Other nail disorders: Secondary | ICD-10-CM | POA: Diagnosis not present

## 2024-03-15 DIAGNOSIS — D6869 Other thrombophilia: Secondary | ICD-10-CM

## 2024-03-15 DIAGNOSIS — E1122 Type 2 diabetes mellitus with diabetic chronic kidney disease: Secondary | ICD-10-CM | POA: Diagnosis not present

## 2024-03-15 DIAGNOSIS — E039 Hypothyroidism, unspecified: Secondary | ICD-10-CM | POA: Diagnosis not present

## 2024-03-15 DIAGNOSIS — Z6831 Body mass index (BMI) 31.0-31.9, adult: Secondary | ICD-10-CM

## 2024-03-15 DIAGNOSIS — I1310 Hypertensive heart and chronic kidney disease without heart failure, with stage 1 through stage 4 chronic kidney disease, or unspecified chronic kidney disease: Secondary | ICD-10-CM | POA: Diagnosis not present

## 2024-03-15 DIAGNOSIS — M24661 Ankylosis, right knee: Secondary | ICD-10-CM | POA: Diagnosis not present

## 2024-03-15 DIAGNOSIS — N1831 Chronic kidney disease, stage 3a: Secondary | ICD-10-CM | POA: Diagnosis not present

## 2024-03-15 DIAGNOSIS — E213 Hyperparathyroidism, unspecified: Secondary | ICD-10-CM

## 2024-03-15 DIAGNOSIS — M0579 Rheumatoid arthritis with rheumatoid factor of multiple sites without organ or systems involvement: Secondary | ICD-10-CM

## 2024-03-15 DIAGNOSIS — I480 Paroxysmal atrial fibrillation: Secondary | ICD-10-CM | POA: Diagnosis not present

## 2024-03-15 DIAGNOSIS — E66811 Obesity, class 1: Secondary | ICD-10-CM

## 2024-03-15 DIAGNOSIS — E6609 Other obesity due to excess calories: Secondary | ICD-10-CM

## 2024-03-15 DIAGNOSIS — Z9989 Dependence on other enabling machines and devices: Secondary | ICD-10-CM

## 2024-03-15 NOTE — Progress Notes (Unsigned)
 I,[name], CMA,acting as a Neurosurgeon for Hannah LOISE Slocumb, MD.,have documented all relevant documentation on the behalf of Hannah LOISE Slocumb, MD,as directed by  Hannah LOISE Slocumb, MD while in the presence of Hannah LOISE Slocumb, MD.  Subjective:  Patient ID: Hannah [name] , female    DOB: [birth year] , 78 y.o.   MRN: [id number]  Chief Complaint  Patient presents with   Hypertension    Patient presents today for bp, dm & thyroid  follow up. She reports compliance with medications. Denies headache, chest pain & sob. She states noticing white toe nails last week. She does not experience any pain. She wants to know if this possibly came from her right knee replacement.    Diabetes   Hypothyroidism    HPI Discussed the use of AI scribe software for clinical note transcription with the patient, who gave verbal consent to proceed.  History of Present Illness Hannah [name] is a 78 year old female with diabetes who presents for a diabetes check.  Her blood sugar levels have been around 95 mg/dL in the mornings, although she did not check it today. Her blood pressure at home was 125/69 mmHg this morning.  She is experiencing pain with walking and has undergone another manipulation on her knee. She is currently in physical therapy and can bend her knee but is working on getting it straighter. She previously attended physical therapy at a different facility but is now attending a new one since May. She feels this facility is more effective.  Her knee issues slow her down and cause frustration.  She notes white discoloration on her toenails, described as 'white lines'. She has not visited a podiatrist in about two years.  She mentions dryness inside her nose and has been using baby oil with a Q-tip to alleviate it. She does not have a humidifier at home and is trying to stay hydrated.  Her thyroid  medications include Synthroid  88 mcg Monday through Saturday, skipping Sunday, and pantoprazole   daily. She takes a multivitamin and other supplements, including vitamin C and D. Her energy levels are 'okay'.   Diabetes She presents for her follow-up diabetic visit. She has type 2 diabetes mellitus. There are no hypoglycemic associated symptoms. Pertinent negatives for hypoglycemia include no headaches. Pertinent negatives for diabetes include no blurred vision and no chest pain. There are no hypoglycemic complications. Diabetic complications include nephropathy. Risk factors for coronary artery disease include diabetes mellitus, dyslipidemia, hypertension, post-menopausal and sedentary lifestyle. She is following a diabetic diet. She participates in exercise three times a week. An ACE inhibitor/angiotensin II receptor blocker is being taken.  Hypertension This is a chronic problem. The current episode started more than 1 year ago. The problem has been gradually improving since onset. The problem is controlled. Pertinent negatives include no blurred vision, chest pain or headaches.     Past Medical History:  Diagnosis Date   Allergy  25 years ago.   Anemia    Arthritis    Asthma    Atrial fibrillation (HCC)    Cataract Had cataracts surgery 2015 & 2016   Chronic kidney disease    Diabetes mellitus without complication (HCC)    Gout 12/17/2014   patient reported   Heart murmur 4 years ago   Hyperlipidemia    Hypertension    Hypothyroidism    Systemic lupus erythematosus (HCC)    Vitiligo      Family History  Problem Relation Age of Onset   Heart disease  Father    Diabetes Father    Hypertension Mother    Obesity Mother    Varicose Veins Mother    Hypertension Sister    Hypertension Sister    Leukemia Sister    Cancer Sister    Obesity Sister      Current Outpatient Medications:    Accu-Chek Softclix Lancets lancets, Use to check blood sugars daily E11.69, Disp: 100 each, Rfl: 2   albuterol  (PROVENTIL  HFA;VENTOLIN  HFA) 108 (90 Base) MCG/ACT inhaler, Inhale 2 puffs into  the lungs every 6 (six) hours as needed for wheezing or shortness of breath., Disp: 1 Inhaler, Rfl: 2   Alcohol  Swabs (ALCOHOL  PADS) 70 % PADS, Use as directed to check blood sugars 1 time per day dx: e11.22, Disp: 150 each, Rfl: 2   allopurinol  (ZYLOPRIM ) 100 MG tablet, TAKE 1 TABLET BY MOUTH 3 TIMES A WEEK, Disp: 90 tablet, Rfl: 2   amLODipine  (NORVASC ) 5 MG tablet, Take 1 tablet (5 mg total) by mouth at bedtime., Disp: 90 tablet, Rfl: 2   apixaban  (ELIQUIS ) 5 MG TABS tablet, Take 1 tablet (5 mg total) by mouth 2 (two) times daily., Disp: 28 tablet, Rfl:    apixaban  (ELIQUIS ) 5 MG TABS tablet, Take 1 tablet (5 mg total) by mouth 2 (two) times daily., Disp: 60 tablet, Rfl: 11   Ascorbic Acid (VITAMIN C PO), Take 500 mg by mouth daily with lunch., Disp: , Rfl:    atorvastatin  (LIPITOR) 80 MG tablet, TAKE 1 TABLET BY MOUTH EVERY DAY, Disp: 90 tablet, Rfl: 2   B Complex-C (B-COMPLEX WITH VITAMIN C) tablet, Take 1 tablet by mouth daily with lunch., Disp: , Rfl:    budesonide -formoterol  (SYMBICORT ) 160-4.5 MCG/ACT inhaler, INHALE 2 PUFFS BY MOUTH TWICE DAILY IN THE MORNING AND IN THE EVENING (Patient taking differently: Inhale 2 puffs into the lungs 2 (two) times daily as needed (Asthma).), Disp: 10.2 g, Rfl: 6   Calcium  Carb-Cholecalciferol 600-800 MG-UNIT TABS, Take 1 tablet by mouth daily with lunch., Disp: , Rfl:    Cholecalciferol (VITAMIN D  PO), Take 2,000 Units by mouth daily with lunch., Disp: , Rfl:    dapagliflozin  propanediol (FARXIGA ) 10 MG TABS tablet, Take 1 tablet (10 mg total) by mouth daily before breakfast., Disp: 90 tablet, Rfl: 3   denosumab  (PROLIA ) 60 MG/ML SOSY injection, Inject 60 mg into the skin every 6 (six) months. Courier to rheum: [address], [place] [address]. Appt on 12/07/2022 (Patient not taking: Reported on 03/17/2024), Disp: 1 mL, Rfl: 0   glucose blood (ACCU-CHEK GUIDE) test strip, USE TO CHECK BLOOD SUGAR DAILY, Disp: 100 strip, Rfl: 2   irbesartan   (AVAPRO ) 300 MG tablet, TAKE 1 TABLET(300 MG) BY MOUTH DAILY, Disp: 90 tablet, Rfl: 2   methocarbamol  (ROBAXIN ) 500 MG tablet, Take 500 mg by mouth every 6 (six) hours as needed for muscle spasms. (Patient not taking: Reported on 03/17/2024), Disp: , Rfl:    metoprolol  succinate (TOPROL -XL) 25 MG 24 hr tablet, TAKE 1 TABLET(25 MG) BY MOUTH DAILY, Disp: 90 tablet, Rfl: 3   Multiple Vitamin (MULTIVITAMIN WITH MINERALS) TABS tablet, Take 1 tablet by mouth daily with lunch. Women's Multivitamin, Disp: , Rfl:    oxycodone  (OXY-IR) 5 MG capsule, Take 5 mg by mouth every 6 (six) hours as needed for pain. (Patient not taking: Reported on 03/17/2024), Disp: , Rfl:    pantoprazole  (PROTONIX ) 40 MG tablet, Take 1 tablet (40 mg total) by mouth 2 (two) times daily for  30 days, THEN 1 tablet (40 mg total) daily. (Patient not taking: No sig reported), Disp: 120 tablet, Rfl: 0   Polyethyl Glycol-Propyl Glycol (SYSTANE) 0.4-0.3 % SOLN, Place 1-2 drops into both eyes 3 (three) times daily as needed (dry/irritated eyes.)., Disp: , Rfl:    SYNTHROID  88 MCG tablet, TAKE 1 TABLET BY MOUTH EVERY DAY MONDAY TO SATURDAY AND OFF ON SUNDAYS, Disp: 90 tablet, Rfl: 0   hydroxychloroquine  (PLAQUENIL ) 200 MG tablet, TAKE 1 TABLET BY MOUTH TWICE DAILY, MONDAY THROUGH FRIDAY ONLY, Disp: 120 tablet, Rfl: 0   Allergies  Allergen Reactions   Shellfish Allergy  Anaphylaxis and Hives     Review of Systems  Constitutional: Negative.   Eyes:  Negative for blurred vision.  Respiratory: Negative.    Cardiovascular: Negative.  Negative for chest pain.  Gastrointestinal: Negative.   Neurological: Negative.  Negative for headaches.  Psychiatric/Behavioral: Negative.       Today's Vitals   03/15/24 1531  BP: 130/70  Pulse: 77  Temp: 98.2 F (36.8 C)  SpO2: 98%  Weight: 153 lb 12.8 oz (69.8 kg)  Height: 4' 11 (1.499 m)   Body mass index is 31.06 kg/m.  Wt Readings from Last 3 Encounters:  03/15/24 153 lb 12.8 oz (69.8 kg)   01/12/24 155 lb (70.3 kg)  12/27/23 156 lb (70.8 kg)     Objective:  Physical Exam Vitals and nursing note reviewed.  Constitutional:      Appearance: Normal appearance.  HENT:     Head: Normocephalic and atraumatic.   Eyes:     Extraocular Movements: Extraocular movements intact.    Cardiovascular:     Rate and Rhythm: Normal rate and regular rhythm.     Heart sounds: Normal heart sounds.  Pulmonary:     Effort: Pulmonary effort is normal.     Breath sounds: Normal breath sounds.   Musculoskeletal:     Cervical back: Normal range of motion.  Feet:     Comments: White, horizontal lines on great toenail  Skin:    General: Skin is warm.   Neurological:     General: No focal deficit present.     Mental Status: She is alert.   Psychiatric:        Mood and Affect: Mood normal.        Behavior: Behavior normal.       Assessment And Plan:  Hypertensive heart and renal disease with renal failure, stage 1 through stage 4 or unspecified chronic kidney disease, without heart failure Assessment & Plan: Chronic, fair control. Goal BP<130/80.   She is reminded to follow low sodium diet. No med changes today. She will continue with irbesartan  300mg  daily, metoprolol  XL 25mg  daily an amlodipine  5mg  daily.   Orders: -     CMP14+EGFR -     Lipid panel -     Hemoglobin A1c  Paroxysmal atrial fibrillation (HCC) Assessment & Plan: Chronic, and paroxysmal.  She is on Eliquis  and metoprolol . She is currently in sinus rhythm.    Type 2 diabetes mellitus with stage 3a chronic kidney disease, without long-term current use of insulin  [facility]) Assessment & Plan: Chronic, her last A1c was 4.8 in December 2024.  She will rto in April/May 2025 for her next A1c check.  She will continue with Farxiga  10mg  daily.   Orders: -     CMP14+EGFR -     Lipid panel -     Hemoglobin A1c  Primary hypothyroidism Assessment & Plan: Chronic, she is  currently taking Synthroid  88mcg M-Sat skipping  Sundays.  I will check thyroid  panel and adjust meds as needed.   Orders: -     TSH -     T4, free  Arthrofibrosis of knee joint, right Assessment & Plan: Persistent knee pain with limited extension despite therapy. Dissatisfaction with previous therapy location due to inadequate equipment. Currently at a facility with better resources. - Continue physical therapy at the current facility. - Follow up with orthopedic specialist next week.   Rheumatoid arthritis with rheumatoid factor of multiple sites without organ or systems involvement [facility]) Assessment & Plan: Chronic, she is on DMARD therapy. She will continue with hydroxychloroquine  200mg  daily. Rheumatology input is appreciated. She is encouraged to follow an anti-inflammatory diet.     Leukonychia Assessment & Plan: White lines on toenails, likely due to CKD. No fungal infection or pain. - Apply Vicks VapoRub around the nail bed and underneath the toenail. - Keep toenails clipped short to promote healthy growth.   Hyperparathyroidism (HCC) Assessment & Plan: Elevated parathyroid hormone levels, possibly related to renal function. Normal calcium  level. Under regular nephrology follow-up. - Send lab results to nephrologist for review. - Phosphorus levels have been normal.  - Continue regular follow-up with nephrologist and rheumatologist in August.   Acquired thrombophilia [facility]) Assessment & Plan: She is on Eliquis  due to PAF.   Class 1 obesity due to excess calories with serious comorbidity and body mass index (BMI) of 31.0 to 31.9 in adult Assessment & Plan: She is encouraged to strive for BMI less than 30 to decrease cardiac risk. Advised to aim for at least 150 minutes of exercise per week.     Ambulates with cane       Return in 4 months (on 07/15/2024), or physical exam.  Patient was given opportunity to ask questions. Patient verbalized understanding of the plan and was able to repeat key elements of  the plan. All questions were answered to their satisfaction.    I, Hannah LOISE Slocumb, MD, have reviewed all documentation for this visit. The documentation on 03/15/24 for the exam, diagnosis, procedures, and orders are all accurate and complete.   IF YOU HAVE BEEN REFERRED TO A SPECIALIST, IT MAY TAKE 1-2 WEEKS TO SCHEDULE/PROCESS THE REFERRAL. IF YOU HAVE NOT HEARD FROM US /SPECIALIST IN TWO WEEKS, PLEASE GIVE US  A CALL AT [phone].   THE PATIENT IS ENCOURAGED TO PRACTICE SOCIAL DISTANCING DUE TO THE COVID-19 PANDEMIC.

## 2024-03-15 NOTE — Patient Instructions (Addendum)
 [employer] - key lime pie  [employer] [place] [employer])  Hypertension, Adult Hypertension is another name for high blood pressure. High blood pressure forces your heart to work harder to pump blood. This can cause problems over time. There are two numbers in a blood pressure reading. There is a top number (systolic) over a bottom number (diastolic). It is best to have a blood pressure that is below 120/80. What are the causes? The cause of this condition is not known. Some other conditions can lead to high blood pressure. What increases the risk? Some lifestyle factors can make you more likely to develop high blood pressure: Smoking. Not getting enough exercise or physical activity. Being overweight. Having too much fat, sugar, calories, or salt (sodium) in your diet. Drinking too much alcohol . Other risk factors include: Having any of these conditions: Heart disease. Diabetes. High cholesterol. Kidney disease. Obstructive sleep apnea. Having a family history of high blood pressure and high cholesterol. Age. The risk increases with age. Stress. What are the signs or symptoms? High blood pressure may not cause symptoms. Very high blood pressure (hypertensive crisis) may cause: Headache. Fast or uneven heartbeats (palpitations). Shortness of breath. Nosebleed. Vomiting or feeling like you may vomit (nauseous). Changes in how you see. Very bad chest pain. Feeling dizzy. Seizures. How is this treated? This condition is treated by making healthy lifestyle changes, such as: Eating healthy foods. Exercising more. Drinking less alcohol . Your doctor may prescribe medicine if lifestyle changes do not help enough and if: Your top number is above 130. Your bottom number is above 80. Your personal target blood pressure may vary. Follow these instructions at home: Eating and drinking  If told, follow the DASH eating plan. To follow this plan: Fill one half of your plate at each  meal with fruits and vegetables. Fill one fourth of your plate at each meal with whole grains. Whole grains include whole-wheat pasta, brown rice, and whole-grain bread. Eat or drink low-fat dairy products, such as skim milk or low-fat yogurt. Fill one fourth of your plate at each meal with low-fat (lean) proteins. Low-fat proteins include fish, chicken without skin, eggs, beans, and tofu. Avoid fatty meat, cured and processed meat, or chicken with skin. Avoid pre-made or processed food. Limit the amount of salt in your diet to less than 1,500 mg each day. Do not drink alcohol  if: Your doctor tells you not to drink. You are pregnant, may be pregnant, or are planning to become pregnant. If you drink alcohol : Limit how much you have to: 0-1 drink a day for women. 0-2 drinks a day for men. Know how much alcohol  is in your drink. In the [place] one drink equals one 12 oz bottle of beer (355 mL), one 5 oz glass of wine (148 mL), or one 1 oz glass of hard liquor (44 mL). Lifestyle  Work with your doctor to stay at a healthy weight or to lose weight. Ask your doctor what the best weight is for you. Get at least 30 minutes of exercise that causes your heart to beat faster (aerobic exercise) most days of the week. This may include walking, swimming, or biking. Get at least 30 minutes of exercise that strengthens your muscles (resistance exercise) at least 3 days a week. This may include lifting weights or doing Pilates. Do not smoke or use any products that contain nicotine or tobacco. If you need help quitting, ask your doctor. Check your blood pressure at home as told  by your doctor. Keep all follow-up visits. Medicines Take over-the-counter and prescription medicines only as told by your doctor. Follow directions carefully. Do not skip doses of blood pressure medicine. The medicine does not work as well if you skip doses. Skipping doses also puts you at risk for problems. Ask your doctor about  side effects or reactions to medicines that you should watch for. Contact a doctor if: You think you are having a reaction to the medicine you are taking. You have headaches that keep coming back. You feel dizzy. You have swelling in your ankles. You have trouble with your vision. Get help right away if: You get a very bad headache. You start to feel mixed up (confused). You feel weak or numb. You feel faint. You have very bad pain in your: Chest. Belly (abdomen). You vomit more than once. You have trouble breathing. These symptoms may be an emergency. Get help right away. Call 911. Do not wait to see if the symptoms will go away. Do not drive yourself to the hospital. Summary Hypertension is another name for high blood pressure. High blood pressure forces your heart to work harder to pump blood. For most people, a normal blood pressure is less than 120/80. Making healthy choices can help lower blood pressure. If your blood pressure does not get lower with healthy choices, you may need to take medicine. This information is not intended to replace advice given to you by your health care provider. Make sure you discuss any questions you have with your health care provider. Document Revised: 07/03/2021 Document Reviewed: 07/03/2021 Elsevier Patient Education  2024 [employer].

## 2024-03-16 ENCOUNTER — Ambulatory Visit: Payer: Self-pay | Admitting: Internal Medicine

## 2024-03-16 ENCOUNTER — Other Ambulatory Visit: Payer: Self-pay | Admitting: Physician Assistant

## 2024-03-16 DIAGNOSIS — M25661 Stiffness of right knee, not elsewhere classified: Secondary | ICD-10-CM | POA: Diagnosis not present

## 2024-03-16 DIAGNOSIS — M0579 Rheumatoid arthritis with rheumatoid factor of multiple sites without organ or systems involvement: Secondary | ICD-10-CM

## 2024-03-16 DIAGNOSIS — Z96651 Presence of right artificial knee joint: Secondary | ICD-10-CM | POA: Diagnosis not present

## 2024-03-16 LAB — CMP14+EGFR
ALT: 11 IU/L (ref 0–32)
AST: 21 IU/L (ref 0–40)
Albumin: 4.1 g/dL (ref 3.8–4.8)
Alkaline Phosphatase: 71 IU/L (ref 44–121)
BUN/Creatinine Ratio: 19 (ref 12–28)
BUN: 23 mg/dL (ref 8–27)
Bilirubin Total: 0.3 mg/dL (ref 0.0–1.2)
CO2: 17 mmol/L — ABNORMAL LOW (ref 20–29)
Calcium: 9.4 mg/dL (ref 8.7–10.3)
Chloride: 105 mmol/L (ref 96–106)
Creatinine, Ser: 1.18 mg/dL — ABNORMAL HIGH (ref 0.57–1.00)
Globulin, Total: 2.4 g/dL (ref 1.5–4.5)
Glucose: 91 mg/dL (ref 70–99)
Potassium: 4.3 mmol/L (ref 3.5–5.2)
Sodium: 140 mmol/L (ref 134–144)
Total Protein: 6.5 g/dL (ref 6.0–8.5)
eGFR: 47 mL/min/{1.73_m2} — ABNORMAL LOW (ref >59)

## 2024-03-16 LAB — LIPID PANEL
Chol/HDL Ratio: 2.3 ratio (ref 0.0–4.4)
Cholesterol, Total: 131 mg/dL (ref 100–199)
HDL: 58 mg/dL (ref >39)
LDL Chol Calc (NIH): 59 mg/dL (ref 0–99)
Triglycerides: 69 mg/dL (ref 0–149)
VLDL Cholesterol Cal: 14 mg/dL (ref 5–40)

## 2024-03-16 LAB — T4, FREE: Free T4: 1.73 ng/dL (ref 0.82–1.77)

## 2024-03-16 LAB — HEMOGLOBIN A1C
Est. average glucose Bld gHb Est-mCnc: 100 mg/dL
Hgb A1c MFr Bld: 5.1 % (ref 4.8–5.6)

## 2024-03-16 LAB — TSH: TSH: 0.446 u[IU]/mL — ABNORMAL LOW (ref 0.450–4.500)

## 2024-03-16 NOTE — Telephone Encounter (Signed)
 Last Fill: 01/10/2024  Eye exam: 02/16/2024 WNL   Labs: 03/15/2024 Creat. 1.18 GFR 47, CO2 17, RBC 3.25, Hgb 10.0, Hct 30.6  Next Visit: 05/11/2024  Last Visit: 12/01/2023  ZO:[id number] systemic lupus erythematosus with other organ involvement   Current Dose per office note 12/01/2023: Plaquenil  200 mg 1 tablet by mouth twice daily Monday through Friday.   Okay to refill Plaquenil ?

## 2024-03-17 ENCOUNTER — Other Ambulatory Visit: Payer: Self-pay

## 2024-03-17 NOTE — Patient Instructions (Signed)
 Visit Information  Thank you for taking time to visit with me today. Please don't hesitate to contact me if I can be of assistance to you before our next scheduled appointment.  Your next care management appointment is by telephone on Tuesday, July 29 at 09:30 AM  Please call the care guide team at [phone] if you need to cancel, schedule, or reschedule an appointment.   Please call 1-800-273-TALK (toll free, 24 hour hotline) if you are experiencing a Mental Health or Behavioral Health Crisis or need someone to talk to.  Louanne Roussel RN BSN CCM [place]  [facility], [facility] Health Nurse Care Coordinator  Direct Dial: [phone] Website: [email or web address]

## 2024-03-17 NOTE — Patient Outreach (Signed)
 Complex Care Management   Visit Note  03/17/2024  Name:  Hannah [name] MRN: [id number] DOB: 1946/08/28  Situation: Referral received for Complex Care Management related to  RA, OA, HTN, Hypothyroidism, CKD, s/p right TKA. I obtained verbal consent from Patient. Visit completed with patient on the phone.  Background:   Past Medical History:  Diagnosis Date   Allergy  25 years ago.   Anemia    Arthritis    Asthma    Atrial fibrillation (HCC)    Cataract Had cataracts surgery 2015 & 2016   Chronic kidney disease    Diabetes mellitus without complication (HCC)    Gout 12/17/2014   patient reported   Heart murmur 4 years ago   Hyperlipidemia    Hypertension    Hypothyroidism    Systemic lupus erythematosus (HCC)    Vitiligo     Assessment: Patient Reported Symptoms:  Cognitive Cognitive Status: Alert and oriented to person, place, and time Cognitive/Intellectual Conditions Management [RPT]: None reported or documented in medical history or problem list   Health Maintenance Behaviors: Annual physical exam, Exercise, Healthy diet, Sleep adequate Healing Pattern: Slow Health Facilitated by: Healthy diet, Pain control  Neurological Neurological Review of Symptoms: No symptoms reported    HEENT HEENT Symptoms Reported: No symptoms reported      Cardiovascular Cardiovascular Symptoms Reported: No symptoms reported Does patient have uncontrolled Hypertension?: No Cardiovascular Conditions: Hypertension, Dysrhythmia (Hypertensive heart and renal disease) Cardiovascular Management Strategies: Medication therapy, Diet modification, Routine screening, Medical device Cardiovascular Self-Management Outcome: 4 (good)  Respiratory Respiratory Symptoms Reported: No symptoms reported Respiratory Conditions: Asthma Respiratory Self-Management Outcome: 4 (good)  Endocrine Patient reports the following symptoms related to hypoglycemia or hyperglycemia : No symptoms reported Is patient  diabetic?: No Endocrine Conditions: Thyroid  disorder, Osteoporosis, Vitamin D  deficiency Endocrine Management Strategies: Medication therapy, Routine screening Endocrine Self-Management Outcome: 4 (good)  Gastrointestinal Gastrointestinal Symptoms Reported: Obesity Gastrointestinal Management Strategies: Diet modification Gastrointestinal Self-Management Outcome: 4 (good) Nutrition Risk Screen (CP): No indicators present  Genitourinary Genitourinary Symptoms Reported: No symptoms reported Genitourinary Conditions: Chronic kidney disease Genitourinary Management Strategies: Fluid modification (Routine Screening) Genitourinary Self-Management Outcome: 4 (good)  Integumentary Integumentary Symptoms Reported: Bruising, Skin changes Additional Integumentary Details: keloids Skin Conditions: Other Other Skin Conditions: patient is scheduled to f/u with her dermatologist Skin Management Strategies: Routine screening Skin Self-Management Outcome: 4 (good)  Musculoskeletal Musculoskelatal Symptoms Reviewed: Difficulty walking, Muscle pain, Unsteady gait Additional Musculoskeletal Details: right knee stiffness and swelling, patient is ambulating with a cane and walker Musculoskeletal Conditions: Joint pain, Mobility limited, Unsteady gait, Rheumatoid arthritis, Osteoarthritis, Other Other Musculoskeletal Conditions: Lupus (SLE) Musculoskeletal Management Strategies: Medication therapy, Routine screening, Medical device, Exercise Musculoskeletal Self-Management Outcome: 4 (good) Falls in the past year?: No Number of falls in past year: 1 or less Was there an injury with Fall?: No Fall Risk Category Calculator: 0 Patient Fall Risk Level: Low Fall Risk Patient at Risk for Falls Due to: Orthopedic patient, Impaired balance/gait, Impaired mobility Fall risk Follow up: Falls evaluation completed, Education provided, Falls prevention discussed  Psychosocial Psychosocial Symptoms Reported: No symptoms  reported   Major Change/Loss/Stressor/Fears (CP): Medical condition, self Techniques to Cope with Loss/Stress/Change: Diversional activities (uses self talk to help cope with recovery from knee replacement) Quality of Family Relationships: helpful, involved, supportive      03/17/2024    9:19 AM  Depression screen PHQ 2/9  Decreased Interest 0  Down, Depressed, Hopeless 0  PHQ - 2 Score 0    There  were no vitals filed for this visit.  Medications Reviewed Today     Reviewed by Kaylene Pascal, RN (Registered Nurse) on 03/17/24 at [id number]  Med List Status: <None>   Medication Order Taking? Sig Documenting Provider Last Dose Status Informant  Accu-Chek Softclix Lancets lancets [id number]  Use to check blood sugars daily E11.69 Cleave Curling, MD  Active Self  albuterol  (PROVENTIL  HFA;VENTOLIN  HFA) 108 (90 Base) MCG/ACT inhaler [id number]  Inhale 2 puffs into the lungs every 6 (six) hours as needed for wheezing or shortness of breath. Susanna Epley, FNP  Active Self           Med Note (Deddrick Saindon L   Thu Feb 10, 2024  9:36 AM)    Alcohol  Swabs (ALCOHOL  PADS) 70 % PADS [id number]  Use as directed to check blood sugars 1 time per day dx: e11.22 Cleave Curling, MD  Active Self  allopurinol  (ZYLOPRIM ) 100 MG tablet [id number]  TAKE 1 TABLET BY MOUTH 3 TIMES A WEEK Cleave Curling, MD  Active   amLODipine  (NORVASC ) 5 MG tablet [id number]  Take 1 tablet (5 mg total) by mouth at bedtime. Cleave Curling, MD  Active Self  apixaban  (ELIQUIS ) 5 MG TABS tablet [id number]  Take 1 tablet (5 mg total) by mouth 2 (two) times daily. Nahser, Lela Purple, MD  Active   apixaban  (ELIQUIS ) 5 MG TABS tablet [id number]  Take 1 tablet (5 mg total) by mouth 2 (two) times daily. Nahser, Lela Purple, MD  Active   Ascorbic Acid (VITAMIN C PO) [id number]  Take 500 mg by mouth daily with lunch. [provider]  Active Self  atorvastatin  (LIPITOR) 80 MG tablet [id number]  TAKE 1 TABLET BY MOUTH EVERY DAY Cleave Curling, MD   Active Self  B Complex-C (B-COMPLEX WITH VITAMIN C) tablet [id number]  Take 1 tablet by mouth daily with lunch. [provider]  Active Self  budesonide -formoterol  (SYMBICORT ) 160-4.5 MCG/ACT inhaler [id number]  INHALE 2 PUFFS BY MOUTH TWICE DAILY IN THE MORNING AND IN THE EVENING  Patient taking differently: Inhale 2 puffs into the lungs 2 (two) times daily as needed (Asthma).   Cleave Curling, MD  Active Self  Calcium  Carb-Cholecalciferol 600-800 MG-UNIT TABS [id number]  Take 1 tablet by mouth daily with lunch. [provider]  Active Self  Cholecalciferol (VITAMIN D  PO) [id number]  Take 2,000 Units by mouth daily with lunch. [provider]  Active Self           Med Note (Bernece Gall L   Thu Feb 10, 2024  9:35 AM)    dapagliflozin  propanediol (FARXIGA ) 10 MG TABS tablet [id number]  Take 1 tablet (10 mg total) by mouth daily before breakfast. Cleave Curling, MD  Active   denosumab  (PROLIA ) 60 MG/ML SOSY injection [id number]  Inject 60 mg into the skin every 6 (six) months. Courier to rheum: [address], [address], [place] [address]. Appt on 12/07/2022  Patient not taking: Reported on 03/17/2024   Nicholas Bari, MD  Expired 03/15/24 2359 Self           Med Note [place] Pierre   Thu Sep 09, 2023 11:14 AM)    glucose blood (ACCU-CHEK GUIDE) test strip [id number]  USE TO CHECK BLOOD SUGAR DAILY Cleave Curling, MD  Active Self  hydroxychloroquine  (PLAQUENIL ) 200 MG tablet [id number]  TAKE 1 TABLET BY MOUTH TWICE DAILY, MONDAY THROUGH FRIDAY ONLY Romayne Clubs, PA-C  Active   irbesartan  (AVAPRO ) 300 MG tablet [id number]  TAKE 1  TABLET(300 MG) BY MOUTH DAILY Cleave Curling, MD  Active   methocarbamol  (ROBAXIN ) 500 MG tablet [id number]  Take 500 mg by mouth every 6 (six) hours as needed for muscle spasms.  Patient not taking: Reported on 03/17/2024   [provider]  Active   metoprolol  succinate (TOPROL -XL) 25 MG 24 hr tablet [id number]  TAKE 1 TABLET(25 MG)  BY MOUTH DAILY Nahser, Lela Purple, MD  Active Self  Multiple Vitamin (MULTIVITAMIN WITH MINERALS) TABS tablet [id number]  Take 1 tablet by mouth daily with lunch. Women's Multivitamin [provider]  Active Self  oxycodone  (OXY-IR) 5 MG capsule [id number]  Take 5 mg by mouth every 6 (six) hours as needed for pain.  Patient not taking: Reported on 03/17/2024   [provider]  Active   pantoprazole  (PROTONIX ) 40 MG tablet [id number]  Take 1 tablet (40 mg total) by mouth 2 (two) times daily for 30 days, THEN 1 tablet (40 mg total) daily.  Patient not taking: No sig reported   Theadore Finger, MD  Expired 03/15/24 2359   Polyethyl Glycol-Propyl Glycol (SYSTANE) 0.4-0.3 % SOLN [id number]  Place 1-2 drops into both eyes 3 (three) times daily as needed (dry/irritated eyes.). [provider]  Active Self  SYNTHROID  88 MCG tablet [id number]  TAKE 1 TABLET BY MOUTH EVERY DAY MONDAY TO SATURDAY AND OFF ON Lehman Pummel, MD  Active Self            Recommendation:   Follow up with Dr. Liliane Rei, Orthopedic surgeon as directed  Specialty provider follow-up with outpatient PT as directed  Follow up with Dr. Zelda Hickman, your kidney specialist as directed  Follow up with Dr. Alvira Josephs, Rheumatologist scheduled for 05/11/24 at 11:00 AM Follow up with Dr. Salomon Cree, medical Oncologist as scheduled on 06/05/24 at 08:00 AM  Follow Up Plan:   Telephone follow up appointment date/time:  Tuesday, July 29 at 09:30 AM  Louanne Roussel RN BSN CCM [place]  [facility], [facility] Health Nurse Care Coordinator  Direct Dial: [phone] Website: [email or web address]

## 2024-03-19 DIAGNOSIS — E213 Hyperparathyroidism, unspecified: Secondary | ICD-10-CM | POA: Insufficient documentation

## 2024-03-19 NOTE — Assessment & Plan Note (Signed)
 Chronic, she is currently taking Synthroid  M-Sat skipping Sundays.  I will check thyroid  panel and adjust meds as needed.

## 2024-03-19 NOTE — Assessment & Plan Note (Signed)
 Chronic, and paroxysmal.  She is on Eliquis  and metoprolol . She is currently in sinus rhythm.

## 2024-03-19 NOTE — Assessment & Plan Note (Signed)
 Elevated parathyroid hormone levels, possibly related to renal function. Normal calcium  level. Under regular nephrology follow-up. - Send lab results to nephrologist for review. - Phosphorus levels have been normal.  - Continue regular follow-up with nephrologist and rheumatologist in August.

## 2024-03-19 NOTE — Assessment & Plan Note (Signed)
 Chronic, she is on DMARD therapy. She will continue with hydroxychloroquine 200mg  daily. Rheumatology input is appreciated. She is encouraged to follow an anti-inflammatory diet.

## 2024-03-19 NOTE — Assessment & Plan Note (Signed)
She is on Eliquis due to PAF.

## 2024-03-19 NOTE — Assessment & Plan Note (Signed)
 Persistent knee pain with limited extension despite therapy. Dissatisfaction with previous therapy location due to inadequate equipment. Currently at a facility with better resources. - Continue physical therapy at the current facility. - Follow up with orthopedic specialist next week.

## 2024-03-19 NOTE — Assessment & Plan Note (Addendum)
 White lines on toenails, likely due to CKD. No fungal infection or pain. - Apply Vicks VapoRub around the nail bed and underneath the toenail. - Keep toenails clipped short to promote healthy growth.

## 2024-03-19 NOTE — Assessment & Plan Note (Signed)
 Chronic, fair control. Goal BP<130/80.   She is reminded to follow low sodium diet. No med changes today. She will continue with irbesartan  300mg  daily, metoprolol  XL 25mg  daily an amlodipine  5mg  daily.

## 2024-03-19 NOTE — Assessment & Plan Note (Signed)
 She is encouraged to strive for BMI less than 30 to decrease cardiac risk. Advised to aim for at least 150 minutes of exercise per week.

## 2024-03-19 NOTE — Assessment & Plan Note (Signed)
 Chronic, her last A1c was 4.8 in December 2024.  She will rto in April/May 2025 for her next A1c check.  She will continue with Farxiga 10mg  daily.

## 2024-03-20 DIAGNOSIS — M25661 Stiffness of right knee, not elsewhere classified: Secondary | ICD-10-CM | POA: Diagnosis not present

## 2024-03-20 DIAGNOSIS — Z96651 Presence of right artificial knee joint: Secondary | ICD-10-CM | POA: Diagnosis not present

## 2024-03-23 DIAGNOSIS — Z96651 Presence of right artificial knee joint: Secondary | ICD-10-CM | POA: Diagnosis not present

## 2024-03-23 DIAGNOSIS — M25661 Stiffness of right knee, not elsewhere classified: Secondary | ICD-10-CM | POA: Diagnosis not present

## 2024-03-27 DIAGNOSIS — Z96651 Presence of right artificial knee joint: Secondary | ICD-10-CM | POA: Diagnosis not present

## 2024-03-27 DIAGNOSIS — M25661 Stiffness of right knee, not elsewhere classified: Secondary | ICD-10-CM | POA: Diagnosis not present

## 2024-03-29 DIAGNOSIS — M25661 Stiffness of right knee, not elsewhere classified: Secondary | ICD-10-CM | POA: Diagnosis not present

## 2024-03-29 DIAGNOSIS — Z96651 Presence of right artificial knee joint: Secondary | ICD-10-CM | POA: Diagnosis not present

## 2024-04-05 DIAGNOSIS — M25661 Stiffness of right knee, not elsewhere classified: Secondary | ICD-10-CM | POA: Diagnosis not present

## 2024-04-05 DIAGNOSIS — Z96651 Presence of right artificial knee joint: Secondary | ICD-10-CM | POA: Diagnosis not present

## 2024-04-07 ENCOUNTER — Other Ambulatory Visit: Payer: Self-pay | Admitting: Cardiovascular Disease

## 2024-04-12 DIAGNOSIS — H26493 Other secondary cataract, bilateral: Secondary | ICD-10-CM | POA: Diagnosis not present

## 2024-04-12 DIAGNOSIS — H43813 Vitreous degeneration, bilateral: Secondary | ICD-10-CM | POA: Diagnosis not present

## 2024-04-12 DIAGNOSIS — H1131 Conjunctival hemorrhage, right eye: Secondary | ICD-10-CM | POA: Diagnosis not present

## 2024-04-12 DIAGNOSIS — H04123 Dry eye syndrome of bilateral lacrimal glands: Secondary | ICD-10-CM | POA: Diagnosis not present

## 2024-04-14 DIAGNOSIS — M25661 Stiffness of right knee, not elsewhere classified: Secondary | ICD-10-CM | POA: Diagnosis not present

## 2024-04-14 DIAGNOSIS — Z96651 Presence of right artificial knee joint: Secondary | ICD-10-CM | POA: Diagnosis not present

## 2024-04-17 ENCOUNTER — Telehealth: Payer: Self-pay | Admitting: Pharmacist

## 2024-04-17 DIAGNOSIS — E1122 Type 2 diabetes mellitus with diabetic chronic kidney disease: Secondary | ICD-10-CM

## 2024-04-17 MED ORDER — DAPAGLIFLOZIN PROPANEDIOL 10 MG PO TABS: 10.0000 mg | ORAL_TABLET | Freq: Every day | ORAL | 3 refills | Status: DC

## 2024-04-17 NOTE — Progress Notes (Signed)
   04/17/2024  Patient ID: Hannah [name], female   DOB: 08-Jul-1946, 78 y.o.   MRN: [id number]  [place] from [place] [employer] [place] requesting a new prescription/refill for Farxiga .      A1c-5.1%  Med Adherence Review Atorvastatin  80 mg  filled 04/11/2024 Irbesartan  300 mg filled 04/01/2024   .[email or web address] Cassius DOROTHA Brought, PharmD, BCACP Clinical Pharmacist [phone]

## 2024-04-18 DIAGNOSIS — M25661 Stiffness of right knee, not elsewhere classified: Secondary | ICD-10-CM | POA: Diagnosis not present

## 2024-04-18 DIAGNOSIS — Z96651 Presence of right artificial knee joint: Secondary | ICD-10-CM | POA: Diagnosis not present

## 2024-04-20 DIAGNOSIS — Z96651 Presence of right artificial knee joint: Secondary | ICD-10-CM | POA: Diagnosis not present

## 2024-04-20 DIAGNOSIS — M25661 Stiffness of right knee, not elsewhere classified: Secondary | ICD-10-CM | POA: Diagnosis not present

## 2024-04-24 DIAGNOSIS — Z96651 Presence of right artificial knee joint: Secondary | ICD-10-CM | POA: Diagnosis not present

## 2024-04-24 DIAGNOSIS — M25661 Stiffness of right knee, not elsewhere classified: Secondary | ICD-10-CM | POA: Diagnosis not present

## 2024-04-25 ENCOUNTER — Other Ambulatory Visit: Payer: Self-pay

## 2024-04-25 NOTE — Patient Outreach (Signed)
 Complex Care Management   Visit Note  04/25/2024  Name:  Hannah [name] MRN: [id number] DOB: March 02, 1946  Situation: Referral received for Complex Care Management related to  RA, OA, SLE, HTN, Hypothyroidism, CKD, s/p right TKA. I obtained verbal consent from Patient.  Visit completed with patient on the phone.  Background:   Past Medical History:  Diagnosis Date   Allergy  25 years ago.   Anemia    Arthritis    Asthma    Atrial fibrillation (HCC)    Cataract Had cataracts surgery 2015 & 2016   Chronic kidney disease    Diabetes mellitus without complication (HCC)    Gout 12/17/2014   patient reported   Heart murmur 4 years ago   Hyperlipidemia    Hypertension    Hypothyroidism    Systemic lupus erythematosus (HCC)    Vitiligo     Assessment: Patient Reported Symptoms:  Cognitive Cognitive Status: Alert and oriented to person, place, and time Cognitive/Intellectual Conditions Management [RPT]: None reported or documented in medical history or problem list   Health Maintenance Behaviors: Annual physical exam, Healthy diet, Exercise, Sleep adequate Healing Pattern: Average Health Facilitated by: Pain control, Rest, Healthy diet  Neurological Neurological Review of Symptoms: No symptoms reported    HEENT HEENT Symptoms Reported: Not assessed      Cardiovascular Cardiovascular Symptoms Reported: No symptoms reported    Respiratory Respiratory Symptoms Reported: No symptoms reported    Endocrine Endocrine Symptoms Reported: No symptoms reported Is patient diabetic?: No Endocrine Self-Management Outcome: 4 (good)  Gastrointestinal Gastrointestinal Symptoms Reported: Not assessed      Genitourinary Genitourinary Symptoms Reported: Not assessed    Integumentary Integumentary Symptoms Reported: No symptoms reported    Musculoskeletal Musculoskelatal Symptoms Reviewed: Joint pain, Unsteady gait Additional Musculoskeletal Details: bilateral stiffness and pain to  middle fingers; limited ROM to right knee Musculoskeletal Management Strategies: Exercise, Medical device, Medication therapy, Routine screening Musculoskeletal Self-Management Outcome: 3 (uncertain)      Psychosocial Psychosocial Symptoms Reported: No symptoms reported     Quality of Family Relationships: helpful, involved, supportive Do you feel physically threatened by others?: No      03/17/2024    9:19 AM  Depression screen PHQ 2/9  Decreased Interest 0  Down, Depressed, Hopeless 0  PHQ - 2 Score 0    There were no vitals filed for this visit.  Medications Reviewed Today     Reviewed by Morgan Clayborne CROME, RN (Registered Nurse) on 04/25/24 at 1017  Med List Status: <None>   Medication Order Taking? Sig Documenting Provider Last Dose Status Informant  Accu-Chek Softclix Lancets lancets [id number]  Use to check blood sugars daily E11.69 Jarold Medici, MD  Active Self  albuterol  (PROVENTIL  HFA;VENTOLIN  HFA) 108 (90 Base) MCG/ACT inhaler [id number]  Inhale 2 puffs into the lungs every 6 (six) hours as needed for wheezing or shortness of breath. Georgina Speaks, FNP  Active Self           Med Note (Heavenly Christine L   Thu Feb 10, 2024  9:36 AM)    Alcohol  Swabs (ALCOHOL  PADS) 70 % PADS [id number]  Use as directed to check blood sugars 1 time per day dx: e11.22 Jarold Medici, MD  Active Self  allopurinol  (ZYLOPRIM ) 100 MG tablet [id number]  TAKE 1 TABLET BY MOUTH 3 TIMES A WEEK Jarold Medici, MD  Active   amLODipine  (NORVASC ) 5 MG tablet [id number]  Take 1 tablet (5 mg total) by mouth at bedtime. Jarold Medici, MD  Active Self  apixaban  (ELIQUIS ) 5 MG TABS tablet [id number]  Take 1 tablet (5 mg total) by mouth 2 (two) times daily. Nahser, Aleene PARAS, MD  Active   apixaban  (ELIQUIS ) 5 MG TABS tablet [id number]  Take 1 tablet (5 mg total) by mouth 2 (two) times daily. Nahser, Aleene PARAS, MD  Active   Ascorbic Acid (VITAMIN C PO) [id number]  Take 500 mg by mouth daily with lunch. [provider]  Active Self  atorvastatin  (LIPITOR) 80 MG tablet [id number]  TAKE 1 TABLET BY MOUTH EVERY DAY Jarold Medici, MD  Active Self  B Complex-C (B-COMPLEX WITH VITAMIN C) tablet [id number]  Take 1 tablet by mouth daily with lunch. [provider]  Active Self  budesonide -formoterol  (SYMBICORT ) 160-4.5 MCG/ACT inhaler [id number]  INHALE 2 PUFFS BY MOUTH TWICE DAILY IN THE MORNING AND IN THE EVENING  Patient taking differently: Inhale 2 puffs into the lungs 2 (two) times daily as needed (Asthma).   Jarold Medici, MD  Active Self  Calcium  Carb-Cholecalciferol 600-800 MG-UNIT TABS [id number]  Take 1 tablet by mouth daily with lunch. [provider]  Active Self  Cholecalciferol (VITAMIN D  PO) [id number]  Take 2,000 Units by mouth daily with lunch. [provider]  Active Self           Med Note (Sonny Anthes L   Thu Feb 10, 2024  9:35 AM)    dapagliflozin  propanediol (FARXIGA ) 10 MG TABS tablet [id number]  Take 1 tablet (10 mg total) by mouth daily before breakfast. Jarold Medici, MD  Active   denosumab  (PROLIA ) 60 MG/ML SOSY injection [id number]  Inject 60 mg into the skin every 6 (six) months. Courier to rheum: [address], [place] [address]. Appt on 12/07/2022  Patient not taking: Reported on 03/17/2024   Dolphus Reiter, MD  Expired 03/15/24 2359 Self           Med Note CHRISTIE ALEXANDER   Thu Sep 09, 2023 11:14 AM)    glucose blood (ACCU-CHEK GUIDE) test strip [id number]  USE TO CHECK BLOOD SUGAR DAILY Jarold Medici, MD  Active Self  hydroxychloroquine  (PLAQUENIL ) 200 MG tablet [id number]  TAKE 1 TABLET BY MOUTH TWICE DAILY, MONDAY THROUGH FRIDAY ONLY Cheryl Waddell HERO, PA-C  Active   irbesartan  (AVAPRO ) 300 MG tablet [id number]  TAKE 1 TABLET(300 MG) BY MOUTH DAILY Jarold Medici, MD  Active   methocarbamol  (ROBAXIN ) 500 MG tablet [id number]  Take 500 mg by mouth every 6 (six) hours as needed for muscle spasms.  Patient not taking: Reported on  03/17/2024   [provider]  Active   metoprolol  succinate (TOPROL -XL) 25 MG 24 hr tablet [id number]  Take 1 tablet (25 mg total) by mouth daily. Please call [phone] to schedule an appointment for future refills. Thank you. Nahser, Aleene PARAS, MD  Active   Multiple Vitamin (MULTIVITAMIN WITH MINERALS) TABS tablet [id number]  Take 1 tablet by mouth daily with lunch. Women's Multivitamin [provider]  Active Self  oxycodone  (OXY-IR) 5 MG capsule [id number]  Take 5 mg by mouth every 6 (six) hours as needed for pain.  Patient not taking: Reported on 03/17/2024   [provider]  Active   pantoprazole  (PROTONIX ) 40 MG tablet [id number]  Take 1 tablet (40 mg total) by mouth 2 (two) times daily for 30 days, THEN 1 tablet (40 mg total) daily.  Patient not taking: No sig reported   Kathrin Mignon DASEN, MD  Expired 03/15/24 2359   Polyethyl  Glycol-Propyl Glycol (SYSTANE) 0.4-0.3 % SOLN [id number]  Place 1-2 drops into both eyes 3 (three) times daily as needed (dry/irritated eyes.). [provider]  Active Self  SYNTHROID  88 MCG tablet [id number] Yes TAKE 1 TABLET BY MOUTH EVERY DAY MONDAY TO SATURDAY AND OFF ON SUNDAYS  Patient taking differently: Take 88 mcg by mouth daily before breakfast. TAKE 1 88 MCG TABLET BY MOUTH ON SATURDAY AND SUNDAY AND  TAKE 1 75 MCG TABLET BY MOUTH EVERY DAY MONDAY THROUGH FRIDAY (samples provided by PCP)   Jarold Medici, MD  Active Self            Recommendation:   Specialty provider follow-up with Dr. Maya Nash on August 14 at 11:00 AM  Keep your scheduled appointments with outpatient PT as directed  Follow up with Dr. Aluisio for Orthopedic needs as directed   Follow Up Plan:   Telephone follow up appointment date/time:  Friday, August 29 at 09:00 AM  Clayborne Ly RN BSN CCM [place]  [facility], [facility] Health Nurse Care Coordinator  Direct Dial: [phone] Website: [email or web address]

## 2024-04-25 NOTE — Patient Instructions (Signed)
 Visit Information  Thank you for taking time to visit with me today. Please don't hesitate to contact me if I can be of assistance to you before our next scheduled appointment.  Your next care management appointment is by telephone on Friday, August 29 at 09:00 AM  Please call the care guide team at [phone] if you need to cancel, schedule, or reschedule an appointment.   Please call 1-800-273-TALK (toll free, 24 hour hotline) if you are experiencing a Mental Health or Behavioral Health Crisis or need someone to talk to.  Clayborne Ly RN BSN CCM [place]  [facility], [facility] Health Nurse Care Coordinator  Direct Dial: [phone] Website: [email or web address]

## 2024-04-26 DIAGNOSIS — M25661 Stiffness of right knee, not elsewhere classified: Secondary | ICD-10-CM | POA: Diagnosis not present

## 2024-04-26 DIAGNOSIS — Z96651 Presence of right artificial knee joint: Secondary | ICD-10-CM | POA: Diagnosis not present

## 2024-05-03 NOTE — Progress Notes (Signed)
 Office Visit Note  Patient: Hannah [name]             Date of Birth: Jun 11, 1946           MRN: [id number]             PCP: Jarold Medici, MD Referring: Jarold Medici, MD Visit Date: 05/11/2024 Occupation: @GUAROCC @  Subjective:  Pain in both hands  History of Present Illness: Hannah [name] is a 77 y.o. female with systemic lupus, rheumatoid arthritis, gout and osteoporosis.  She returns today after her last visit in March 2025.  She states recently she has been having increased pain and discomfort in her both hands.  She states her left hand appears to be swollen.  She has been taking hydroxychloroquine  200 mg p.o. twice daily Monday to Friday without any interruption.  She also takes allopurinol  100 mg 3 times a week.  None of the other joints are painful.  She ambulates with the help of a cane.    Activities of Daily Living:  Patient reports morning stiffness for 15-20 minutes.   Patient Denies nocturnal pain.  Difficulty dressing/grooming: Denies Difficulty climbing stairs: Reports Difficulty getting out of chair: Reports Difficulty using hands for taps, buttons, cutlery, and/or writing: Denies  Review of Systems  Constitutional:  Positive for fatigue.  HENT:  Negative for mouth sores and mouth dryness.   Eyes:  Positive for dryness.  Respiratory:  Negative for shortness of breath.   Cardiovascular:  Negative for chest pain and palpitations.  Gastrointestinal:  Negative for blood in stool, constipation and diarrhea.  Endocrine: Negative for increased urination.  Genitourinary:  Negative for involuntary urination.  Musculoskeletal:  Positive for joint pain, gait problem, joint pain, joint swelling, myalgias, morning stiffness and myalgias. Negative for muscle weakness and muscle tenderness.  Skin:  Negative for color change, rash, hair loss and sensitivity to sunlight.  Allergic/Immunologic: Negative for susceptible to infections.  Neurological:  Negative for  dizziness and headaches.  Hematological:  Positive for swollen glands.  Psychiatric/Behavioral:  Positive for sleep disturbance. Negative for depressed mood. The patient is not nervous/anxious.     PMFS History:  Patient Active Problem List   Diagnosis Date Noted   Hyperparathyroidism (HCC) 03/19/2024   Leukonychia 03/15/2024   Encounter for breast and pelvic examination 01/12/2024   Class 1 obesity due to excess calories with serious comorbidity and body mass index (BMI) of 31.0 to 31.9 in adult 10/17/2023   Chronic superficial gastritis with bleeding 10/17/2023   Occult blood detected in feces by immunoassay 09/26/2023   ABLA (acute blood loss anemia) 09/23/2023   GI bleed 09/23/2023   Pure hypercholesterolemia 07/18/2023   Personal history of COVID-19 07/18/2023   Primary osteoarthritis of right knee 07/12/2023   Acquired thrombophilia (HCC) 01/12/2023   Arthrofibrosis of knee joint, right 07/06/2022   OA (osteoarthritis) of knee 02/02/2022   Primary osteoarthritis of left knee 02/02/2022   Chronic bilateral low back pain without sciatica 06/25/2021   Paresthesia and pain of extremity 06/25/2021   Atrial fibrillation (HCC) 04/16/2021   Demand ischemia (HCC) 03/26/2021   Unspecified atrial fibrillation (HCC) 03/26/2021   COVID-19 virus infection 03/26/2021   PVC (premature ventricular contraction) 05/06/2020   Type 2 diabetes mellitus with stage 3 chronic kidney disease, without long-term current use of insulin  (HCC) 03/20/2020   Palpitations 03/20/2020   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   Primary hypothyroidism 03/20/2020   Vitiligo 03/20/2020   Iron  deficiency anemia 08/12/2017  Osteoporosis 10/02/2016   Systemic lupus erythematosus (HCC) 10/02/2016   Rheumatoid arthritis with rheumatoid factor of multiple sites without organ or systems involvement (HCC) 10/02/2016   High risk medication use 10/02/2016   History of chronic kidney disease 10/02/2016    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/2016   Primary osteoarthritis of both knees 10/02/2016   Vitamin D  deficiency 10/02/2016   History of diabetes mellitus 10/02/2016   History of hypertension 10/02/2016   History of asthma 10/02/2016   Absolute anemia    MGUS (monoclonal gammopathy of unknown significance)    Normocytic anemia 03/21/2015   Essential hypertension 03/20/2015   Abnormal CT of the chest 07/30/2012   Asthma 06/19/2012    Past Medical History:  Diagnosis Date   Allergy  25 years ago.   Anemia    Arthritis    Asthma    Atrial fibrillation (HCC)    Cataract Had cataracts surgery 2015 & 2016   Chronic kidney disease    Diabetes mellitus without complication (HCC)    Gout 12/17/2014   patient reported   Heart murmur 4 years ago   Hyperlipidemia    Hypertension    Hypothyroidism    Systemic lupus erythematosus (HCC)    Vitiligo     Family History  Problem Relation Age of Onset   Heart disease Father    Diabetes Father    Hypertension Mother    Obesity Mother    Varicose Veins Mother    Hypertension Sister    Hypertension Sister    Leukemia Sister    Cancer Sister    Obesity Sister    Past Surgical History:  Procedure Laterality Date   BIOPSY  09/26/2023   Procedure: BIOPSY;  Surgeon: Aneita Gwendlyn DASEN, MD;  Location: [facility];  Service: Gastroenterology;;   CATARACT EXTRACTION Bilateral 2015   COLONOSCOPY WITH PROPOFOL  N/A 09/27/2023   Procedure: COLONOSCOPY WITH PROPOFOL ;  Surgeon: Kristie Lamprey, MD;  Location: [facility];  Service: Gastroenterology;  Laterality: N/A;   DILATION AND CURETTAGE OF UTERUS     DOPPLER ECHOCARDIOGRAPHY  05/2018   Internist to review with pt; potential heart murmur 06/20/18   ESOPHAGOGASTRODUODENOSCOPY (EGD) WITH PROPOFOL  N/A 09/26/2023   Procedure: ESOPHAGOGASTRODUODENOSCOPY (EGD) WITH PROPOFOL ;  Surgeon: Aneita Gwendlyn DASEN, MD;  Location: [facility];  Service: Gastroenterology;  Laterality: N/A;    EYE SURGERY  Cataract Surgery, 2015 & 2016.   keratosis removal  2021   KNEE CLOSED REDUCTION Left 07/06/2022   Procedure: CLOSED MANIPULATION KNEE;  Surgeon: Melodi Lerner, MD;  Location: [facility];  Service: Orthopedics;  Laterality: Left;   KNEE CLOSED REDUCTION Right 09/13/2023   Procedure: CLOSED MANIPULATION KNEE;  Surgeon: Melodi Lerner, MD;  Location: [facility];  Service: Orthopedics;  Laterality: Right;   REPLACEMENT TOTAL KNEE Left    SKIN SURGERY  11/30/2018   left side of face   TOOTH EXTRACTION     TOTAL KNEE ARTHROPLASTY Left 02/02/2022   Procedure: TOTAL KNEE ARTHROPLASTY;  Surgeon: Melodi Lerner, MD;  Location: [facility];  Service: Orthopedics;  Laterality: Left;   TOTAL KNEE ARTHROPLASTY Right 07/12/2023   Procedure: RIGHT TOTAL KNEE ARTHROPLASTY;  Surgeon: Melodi Lerner, MD;  Location: [facility];  Service: Orthopedics;  Laterality: Right;   Social History   Social History Narrative   Not on file   Immunization History  Administered Date(s) Administered   19-influenza Whole 07/15/2012   DTaP 06/18/2019   Fluad Quad(high Dose 65+) 06/19/2020, 06/03/2021   Fluad Trivalent(High Dose  65+) 09/25/2023   H1N1 08/09/2008   Influenza Split 07/27/2008, 06/28/2014   Influenza, High Dose Seasonal PF 05/18/2019, 06/04/2022   Influenza,inj,Quad PF,6+ Mos 06/20/2018   MMR 06/20/2010   Moderna Covid-19 Fall Seasonal Vaccine 43yrs & older 07/27/2022   Moderna SARS-COV2 Booster Vaccination 01/01/2021   Moderna Sars-Covid-2 Vaccination 11/20/2019, 12/19/2019, 08/06/2020, 01/01/2021, 08/13/2021   Pfizer Covid-19 Vaccine Bivalent Booster 23yrs & up 07/27/2022   Pneumococcal Conjugate-13 05/04/2018   Pneumococcal Polysaccharide-23 05/18/2019   Pneumococcal-Unspecified 06/28/2014   RSV,unspecified 10/22/2022   Tdap 06/15/2019   Tetanus 07/22/2005   Zoster Recombinant(Shingrix) 02/19/2021, 04/24/2021     Objective: Vital Signs: BP 137/78 (BP Location: Left Arm, Patient Position:  Sitting, Cuff Size: Normal)   Pulse 70   Resp 16   Ht 4' 10.5 (1.486 m)   Wt 160 lb (72.6 kg)   BMI 32.87 kg/m    Physical Exam Vitals and nursing note reviewed.  Constitutional:      Appearance: She is well-developed.  HENT:     Head: Normocephalic and atraumatic.  Eyes:     Conjunctiva/sclera: Conjunctivae normal.  Cardiovascular:     Rate and Rhythm: Normal rate and regular rhythm.     Heart sounds: Normal heart sounds.  Pulmonary:     Effort: Pulmonary effort is normal.     Breath sounds: Normal breath sounds.  Abdominal:     General: Bowel sounds are normal.     Palpations: Abdomen is soft.  Musculoskeletal:     Cervical back: Normal range of motion.  Lymphadenopathy:     Cervical: No cervical adenopathy.  Skin:    General: Skin is warm and dry.     Capillary Refill: Capillary refill takes less than 2 seconds.  Neurological:     Mental Status: She is alert and oriented to person, place, and time.  Psychiatric:        Behavior: Behavior normal.      Musculoskeletal Exam: Patient was examined in the seated position.  Cervical spine was in good range of motion.  She had thoracic kyphosis without tenderness over thoracic or lumbar spine.  Shoulder joints and elbow joints were in good range of motion.  She has thickening of MCPs PIPs and DIPs with no synovitis was noted.  Hip joints could not be assessed in the seated position.  Knee joints were replaced and were in good range of motion.  She had warmth and palpation of her knee joints.  There was no tenderness over her ankles or MTPs.  CDAI Exam: CDAI Score: -- Patient Global: --; Provider Global: -- Swollen: --; Tender: -- Joint Exam 05/11/2024   No joint exam has been documented for this visit   There is currently no information documented on the homunculus. Go to the Rheumatology activity and complete the homunculus joint exam.  Investigation: No additional findings.  Imaging: No results found.  Recent  Labs: Lab Results  Component Value Date   WBC 5.6 12/01/2023   HGB 10.0 (L) 12/01/2023   PLT 218 12/01/2023   NA 140 03/15/2024   K 4.3 03/15/2024   CL 105 03/15/2024   CO2 17 (L) 03/15/2024   GLUCOSE 91 03/15/2024   BUN 23 03/15/2024   CREATININE 1.18 (H) 03/15/2024   BILITOT 0.3 03/15/2024   ALKPHOS 71 03/15/2024   AST 21 03/15/2024   ALT 11 03/15/2024   PROT 6.5 03/15/2024   ALBUMIN 4.1 03/15/2024   CALCIUM  9.4 03/15/2024   GFRAA 44 (L) 12/19/2020  Speciality Comments: PLQ eye exam: 02/16/2024 [facility] Follow up in 1 year  Procedures:  No procedures performed Allergies: Shellfish allergy    Assessment / Plan:     Visit Diagnoses: Other systemic lupus erythematosus with other organ involvement (HCC) - Positive ANA, positive Smith, positive RNP:-Patient complains of ongoing pain and swelling in her joints.  She reports mild swelling in the bilateral hands.  Synovial thickening but no synovitis was noted.  I will obtain an ultrasound of the bilateral hands to look for synovitis.  Labs from December 01, 2023 double-stranded DNA was elevated at 66.  Complements were normal and sed rate was normal.  High risk medication use - Plaquenil  200 mg 1 tablet by mouth twice daily Monday through Friday. PLQ eye exam: 02/16/2024.  CBC was stable in March 2025.  CMP was normal in June 2025 except for elevated creatinine at 1.18.  Information iMessage was placed in the AVS.  Rheumatoid arthritis involving multiple sites with positive rheumatoid factor (HCC) - Positive RF, positive anti-CCP: She continues to complain of discomfort in her hands.  No synovitis was noted.  Puffiness was noted on the dorsum of her left hand.  Bilateral hand pain-patient has been complaining of increased pain and discomfort in the bilateral hands.  No synovitis was noted on the examination.  She had puffiness over the dorsum of her left hand.  Ultrasound examination of bilateral hands was performed to look for  synovitis.  Ultrasound was negative for synovitis or tenosynovitis.  Severe narrowing of MCP joints was noted.  Ultrasound findings were reviewed with the patient.  No change in treatment was advised.  Age-related osteoporosis without current pathological fracture - DEXA 09/03/22: RFN T-score -2.4 BMD 0.581.Previous DEXA 08/09/20: Total right hip BMD 0.660 with T score -2.4.prolia  60 mg sq injections every 6 months.  Last Prolia  injection was on December 15, 2023.  Vitamin D  deficiency-vitamin D  was 81 on December 01, 2023.  Idiopathic chronic gout of multiple sites without tophus - allopurinol  100 mg 3 times a week.  Uric acid was 5.2 on December 01, 2023.  Status post total knee replacement, left - Performed on 02/02/2022 by Dr. Hiram.  Completed physical therapy.  Doing well.  Status post total knee replacement, right - 07/12/23 by Dr. Hiram.  Doing well.  She continues to have some warmth in her knee joints.  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  History of hypertension-blood patient was normal today.  History of diabetes mellitus  Chronic anticoagulation  History of chronic kidney disease  Paroxysmal atrial fibrillation (HCC)  History of asthma  History of anemia  Orders: Orders Placed This Encounter  Procedures   Protein / creatinine ratio, urine   CBC with Differential/Platelet   Comprehensive metabolic panel with GFR   ANA   Anti-DNA antibody, double-stranded   C3 and C4   Sedimentation rate   RNP Antibody   Anti-Smith antibody   Uric acid   No orders of the defined types were placed in this encounter.   Follow-Up Instructions: Return for Systemic lupus.   Maya Nash, MD  Note - This record has been created using Animal nutritionist.  Chart creation errors have been sought, but may not always  have been located. Such creation errors do not reflect on  the standard of medical  care.

## 2024-05-08 DIAGNOSIS — M25661 Stiffness of right knee, not elsewhere classified: Secondary | ICD-10-CM | POA: Diagnosis not present

## 2024-05-08 DIAGNOSIS — Z96651 Presence of right artificial knee joint: Secondary | ICD-10-CM | POA: Diagnosis not present

## 2024-05-10 DIAGNOSIS — Z96651 Presence of right artificial knee joint: Secondary | ICD-10-CM | POA: Diagnosis not present

## 2024-05-10 DIAGNOSIS — M25661 Stiffness of right knee, not elsewhere classified: Secondary | ICD-10-CM | POA: Diagnosis not present

## 2024-05-11 ENCOUNTER — Ambulatory Visit

## 2024-05-11 ENCOUNTER — Ambulatory Visit: Attending: Rheumatology | Admitting: Rheumatology

## 2024-05-11 ENCOUNTER — Encounter: Payer: Self-pay | Admitting: Rheumatology

## 2024-05-11 VITALS — BP 137/78 | HR 70 | Resp 16 | Ht 58.5 in | Wt 160.0 lb

## 2024-05-11 DIAGNOSIS — Z79899 Other long term (current) drug therapy: Secondary | ICD-10-CM | POA: Diagnosis not present

## 2024-05-11 DIAGNOSIS — M0579 Rheumatoid arthritis with rheumatoid factor of multiple sites without organ or systems involvement: Secondary | ICD-10-CM

## 2024-05-11 DIAGNOSIS — Z96651 Presence of right artificial knee joint: Secondary | ICD-10-CM

## 2024-05-11 DIAGNOSIS — M1A09X0 Idiopathic chronic gout, multiple sites, without tophus (tophi): Secondary | ICD-10-CM

## 2024-05-11 DIAGNOSIS — M79641 Pain in right hand: Secondary | ICD-10-CM

## 2024-05-11 DIAGNOSIS — I480 Paroxysmal atrial fibrillation: Secondary | ICD-10-CM

## 2024-05-11 DIAGNOSIS — Z862 Personal history of diseases of the blood and blood-forming organs and certain disorders involving the immune mechanism: Secondary | ICD-10-CM

## 2024-05-11 DIAGNOSIS — E559 Vitamin D deficiency, unspecified: Secondary | ICD-10-CM

## 2024-05-11 DIAGNOSIS — D472 Monoclonal gammopathy: Secondary | ICD-10-CM | POA: Diagnosis not present

## 2024-05-11 DIAGNOSIS — Z8639 Personal history of other endocrine, nutritional and metabolic disease: Secondary | ICD-10-CM

## 2024-05-11 DIAGNOSIS — Z7901 Long term (current) use of anticoagulants: Secondary | ICD-10-CM

## 2024-05-11 DIAGNOSIS — M79642 Pain in left hand: Secondary | ICD-10-CM

## 2024-05-11 DIAGNOSIS — Z8709 Personal history of other diseases of the respiratory system: Secondary | ICD-10-CM

## 2024-05-11 DIAGNOSIS — M3219 Other organ or system involvement in systemic lupus erythematosus: Secondary | ICD-10-CM | POA: Diagnosis not present

## 2024-05-11 DIAGNOSIS — Z96652 Presence of left artificial knee joint: Secondary | ICD-10-CM | POA: Diagnosis not present

## 2024-05-11 DIAGNOSIS — M810 Age-related osteoporosis without current pathological fracture: Secondary | ICD-10-CM

## 2024-05-11 DIAGNOSIS — Z87448 Personal history of other diseases of urinary system: Secondary | ICD-10-CM

## 2024-05-11 DIAGNOSIS — Z8679 Personal history of other diseases of the circulatory system: Secondary | ICD-10-CM

## 2024-05-12 ENCOUNTER — Ambulatory Visit: Payer: Self-pay | Admitting: Rheumatology

## 2024-05-12 DIAGNOSIS — Z96651 Presence of right artificial knee joint: Secondary | ICD-10-CM | POA: Diagnosis not present

## 2024-05-12 DIAGNOSIS — M24561 Contracture, right knee: Secondary | ICD-10-CM | POA: Diagnosis not present

## 2024-05-12 NOTE — Progress Notes (Signed)
 Protein creatinine ratio remains elevated, hemoglobin is low.  Creatinine is elevated with low GFR.  Sed rate normal, uric acid normal, autoimmune labs are pending.  Please check if patient is followed by nephrologist, if so please forward results to the nephrologist otherwise please refer her to nephrology.

## 2024-05-13 NOTE — Progress Notes (Signed)
 Double-stranded DNA is positive at lower titer, RNP negative, Smith negative, complement complements are normal, sed rate is normal.  Labs do not indicate an autoimmune disease flare.  Please forward labs to her nephrologist.

## 2024-05-14 LAB — ANTI-NUCLEAR AB-TITER (ANA TITER)
ANA TITER: 1:80 {titer} — ABNORMAL HIGH
ANA Titer 1: 1:1280 {titer} — ABNORMAL HIGH

## 2024-05-14 LAB — PROTEIN / CREATININE RATIO, URINE
Creatinine, Urine: 58 mg/dL (ref 20–275)
Protein/Creat Ratio: 345 mg/g{creat} — ABNORMAL HIGH (ref 24–184)
Protein/Creatinine Ratio: 0.345 mg/mg{creat} — ABNORMAL HIGH (ref 0.024–0.184)
Total Protein, Urine: 20 mg/dL (ref 5–24)

## 2024-05-14 LAB — CBC WITH DIFFERENTIAL/PLATELET
Absolute Lymphocytes: 993 {cells}/uL (ref 850–3900)
Absolute Monocytes: 374 {cells}/uL (ref 200–950)
Basophils Absolute: 42 {cells}/uL (ref 0–200)
Basophils Relative: 0.8 %
Eosinophils Absolute: 421 {cells}/uL (ref 15–500)
Eosinophils Relative: 8.1 %
HCT: 26.8 % — ABNORMAL LOW (ref 35.0–45.0)
Hemoglobin: 8.3 g/dL — ABNORMAL LOW (ref 11.7–15.5)
MCH: 30.5 pg (ref 27.0–33.0)
MCHC: 31 g/dL — ABNORMAL LOW (ref 32.0–36.0)
MCV: 98.5 fL (ref 80.0–100.0)
MPV: 10.8 fL (ref 7.5–12.5)
Monocytes Relative: 7.2 %
Neutro Abs: 3370 {cells}/uL (ref 1500–7800)
Neutrophils Relative %: 64.8 %
Platelets: 201 Thousand/uL (ref 140–400)
RBC: 2.72 Million/uL — ABNORMAL LOW (ref 3.80–5.10)
RDW: 13 % (ref 11.0–15.0)
Total Lymphocyte: 19.1 %
WBC: 5.2 Thousand/uL (ref 3.8–10.8)

## 2024-05-14 LAB — ANTI-DNA ANTIBODY, DOUBLE-STRANDED: ds DNA Ab: 22 [IU]/mL — ABNORMAL HIGH

## 2024-05-14 LAB — ANA: Anti Nuclear Antibody (ANA): POSITIVE — AB

## 2024-05-14 LAB — COMPREHENSIVE METABOLIC PANEL WITH GFR
AG Ratio: 1.8 (calc) (ref 1.0–2.5)
ALT: 14 U/L (ref 6–29)
AST: 23 U/L (ref 10–35)
Albumin: 4 g/dL (ref 3.6–5.1)
Alkaline phosphatase (APISO): 55 U/L (ref 37–153)
BUN/Creatinine Ratio: 17 (calc) (ref 6–22)
BUN: 23 mg/dL (ref 7–25)
CO2: 23 mmol/L (ref 20–32)
Calcium: 8.7 mg/dL (ref 8.6–10.4)
Chloride: 106 mmol/L (ref 98–110)
Creat: 1.33 mg/dL — ABNORMAL HIGH (ref 0.60–1.00)
Globulin: 2.2 g/dL (ref 1.9–3.7)
Glucose, Bld: 73 mg/dL (ref 65–99)
Potassium: 4.6 mmol/L (ref 3.5–5.3)
Sodium: 140 mmol/L (ref 135–146)
Total Bilirubin: 0.4 mg/dL (ref 0.2–1.2)
Total Protein: 6.2 g/dL (ref 6.1–8.1)
eGFR: 41 mL/min/1.73m2 — ABNORMAL LOW (ref >=60)

## 2024-05-14 LAB — C3 AND C4
C3 Complement: 113 mg/dL (ref 83–193)
C4 Complement: 33 mg/dL (ref 15–57)

## 2024-05-14 LAB — RNP ANTIBODY: Ribonucleic Protein(ENA) Antibody, IgG: <1 AI

## 2024-05-14 LAB — URIC ACID: Uric Acid, Serum: 4.8 mg/dL (ref 2.5–7.0)

## 2024-05-14 LAB — SEDIMENTATION RATE: Sed Rate: 9 mm/h (ref 0–30)

## 2024-05-14 LAB — ANTI-SMITH ANTIBODY: ENA SM Ab Ser-aCnc: <1 AI

## 2024-05-15 DIAGNOSIS — N183 Chronic kidney disease, stage 3 unspecified: Secondary | ICD-10-CM | POA: Diagnosis not present

## 2024-05-17 DIAGNOSIS — N183 Chronic kidney disease, stage 3 unspecified: Secondary | ICD-10-CM | POA: Diagnosis not present

## 2024-05-17 DIAGNOSIS — Z1231 Encounter for screening mammogram for malignant neoplasm of breast: Secondary | ICD-10-CM | POA: Diagnosis not present

## 2024-05-17 LAB — HM MAMMOGRAPHY

## 2024-05-22 ENCOUNTER — Other Ambulatory Visit: Payer: Self-pay | Admitting: Internal Medicine

## 2024-05-22 DIAGNOSIS — I10 Essential (primary) hypertension: Secondary | ICD-10-CM

## 2024-05-23 ENCOUNTER — Telehealth: Payer: Self-pay | Admitting: *Deleted

## 2024-05-23 DIAGNOSIS — E1122 Type 2 diabetes mellitus with diabetic chronic kidney disease: Secondary | ICD-10-CM | POA: Diagnosis not present

## 2024-05-23 DIAGNOSIS — N183 Chronic kidney disease, stage 3 unspecified: Secondary | ICD-10-CM | POA: Diagnosis not present

## 2024-05-23 DIAGNOSIS — M329 Systemic lupus erythematosus, unspecified: Secondary | ICD-10-CM | POA: Diagnosis not present

## 2024-05-23 DIAGNOSIS — D472 Monoclonal gammopathy: Secondary | ICD-10-CM | POA: Diagnosis not present

## 2024-05-23 DIAGNOSIS — I129 Hypertensive chronic kidney disease with stage 1 through stage 4 chronic kidney disease, or unspecified chronic kidney disease: Secondary | ICD-10-CM | POA: Diagnosis not present

## 2024-05-23 DIAGNOSIS — N189 Chronic kidney disease, unspecified: Secondary | ICD-10-CM | POA: Diagnosis not present

## 2024-05-23 DIAGNOSIS — D631 Anemia in chronic kidney disease: Secondary | ICD-10-CM | POA: Diagnosis not present

## 2024-05-23 NOTE — Telephone Encounter (Signed)
 Patient contacted the office requesting her last office visit and labs to be sent to Dr. Kristie. Faxed as requested.

## 2024-05-24 DIAGNOSIS — N189 Chronic kidney disease, unspecified: Secondary | ICD-10-CM | POA: Diagnosis not present

## 2024-05-26 ENCOUNTER — Other Ambulatory Visit (HOSPITAL_COMMUNITY): Payer: Self-pay

## 2024-05-26 ENCOUNTER — Other Ambulatory Visit: Payer: Self-pay | Admitting: *Deleted

## 2024-05-26 ENCOUNTER — Telehealth: Payer: Self-pay

## 2024-05-26 DIAGNOSIS — M79642 Pain in left hand: Secondary | ICD-10-CM | POA: Insufficient documentation

## 2024-05-26 DIAGNOSIS — D472 Monoclonal gammopathy: Secondary | ICD-10-CM | POA: Insufficient documentation

## 2024-05-26 DIAGNOSIS — E66811 Obesity, class 1: Secondary | ICD-10-CM | POA: Insufficient documentation

## 2024-05-26 DIAGNOSIS — K5730 Diverticulosis of large intestine without perforation or abscess without bleeding: Secondary | ICD-10-CM | POA: Insufficient documentation

## 2024-05-26 DIAGNOSIS — Z79899 Other long term (current) drug therapy: Secondary | ICD-10-CM | POA: Insufficient documentation

## 2024-05-26 DIAGNOSIS — E213 Hyperparathyroidism, unspecified: Secondary | ICD-10-CM

## 2024-05-26 DIAGNOSIS — N08 Glomerular disorders in diseases classified elsewhere: Secondary | ICD-10-CM | POA: Insufficient documentation

## 2024-05-26 DIAGNOSIS — E782 Mixed hyperlipidemia: Secondary | ICD-10-CM | POA: Insufficient documentation

## 2024-05-26 DIAGNOSIS — K2901 Acute gastritis with bleeding: Secondary | ICD-10-CM | POA: Insufficient documentation

## 2024-05-26 DIAGNOSIS — J4541 Moderate persistent asthma with (acute) exacerbation: Secondary | ICD-10-CM | POA: Insufficient documentation

## 2024-05-26 DIAGNOSIS — E039 Hypothyroidism, unspecified: Secondary | ICD-10-CM

## 2024-05-26 DIAGNOSIS — D649 Anemia, unspecified: Secondary | ICD-10-CM | POA: Insufficient documentation

## 2024-05-26 DIAGNOSIS — K269 Duodenal ulcer, unspecified as acute or chronic, without hemorrhage or perforation: Secondary | ICD-10-CM | POA: Insufficient documentation

## 2024-05-26 DIAGNOSIS — R1013 Epigastric pain: Secondary | ICD-10-CM | POA: Insufficient documentation

## 2024-05-26 DIAGNOSIS — K219 Gastro-esophageal reflux disease without esophagitis: Secondary | ICD-10-CM | POA: Insufficient documentation

## 2024-05-26 DIAGNOSIS — R634 Abnormal weight loss: Secondary | ICD-10-CM | POA: Insufficient documentation

## 2024-05-26 DIAGNOSIS — M109 Gout, unspecified: Secondary | ICD-10-CM | POA: Insufficient documentation

## 2024-05-26 NOTE — Telephone Encounter (Signed)
 PAP: Patient assistance application for Eliquis  through [employer] ([employer] has been mailed to pt's home address on file. Provider portion of application will be faxed to provider's office once patient portion has been received.

## 2024-05-26 NOTE — Patient Instructions (Signed)
 Visit Information  Thank you for taking time to visit with me today. Please don't hesitate to contact me if I can be of assistance to you before our next scheduled appointment.  Your next care management appointment is by telephone on 06/01/24 at 0900 with Clayborne Ly RN CM     Please call the care guide team at [phone] if you need to cancel, schedule, or reschedule an appointment.   Please  if you are experiencing a Mental Health or Behavioral Health Crisis or need someone to talk to.  Pricsilla Lindvall L. Ramonita, RN, BSN, CCM [place]  [facility], [facility] RN Care Manager Direct Dial: [phone]  Fax: [phone]

## 2024-05-26 NOTE — Patient Outreach (Signed)
 Complex Care Management   Visit Note  05/26/2024  Name:  Hannah [name] MRN: [id number] DOB: December 14, 1945  Situation: Referral received for Complex Care Management related to Chronic Kidney Disease and RA, OA, SLE, HTN, Hypothyroidism, s/p right TKA I obtained verbal consent from Patient.  Visit completed with Patient  on the phone   Covering for A Little RN CM With outreach to patient she voiced interest in consistency with her current RN CM for consistency Questions answered Rescheduled with her assistance to 06/01/24 0900 with her pcp office RN CM She voices concern today with samples of Synthroid  She gave RN CM permission to outreach to her pcp office for samples and to do a pharmacy referral to assist with this medication in the future  Background:   Past Medical History:  Diagnosis Date   Allergy  25 years ago.   Anemia    Arthritis    Asthma    Atrial fibrillation (HCC)    Cataract Had cataracts surgery 2015 & 2016   Chronic kidney disease    Diabetes mellitus without complication (HCC)    Gout 12/17/2014   patient reported   Heart murmur 4 years ago   Hyperlipidemia    Hypertension    Hypothyroidism    Systemic lupus erythematosus (HCC)    Vitiligo     Assessment: Patient Reported Symptoms:  Cognitive Cognitive Status: No symptoms reported      Neurological Neurological Review of Symptoms: No symptoms reported Neurological Self-Management Outcome: 4 (good)  HEENT HEENT Symptoms Reported: No symptoms reported HEENT Self-Management Outcome: 4 (good)    Cardiovascular Cardiovascular Symptoms Reported: No symptoms reported Cardiovascular Self-Management Outcome: 4 (good)  Respiratory Respiratory Symptoms Reported: Other: Other Respiratory Symptoms: sneezing Respiratory Self-Management Outcome: 3 (uncertain)  Endocrine Endocrine Symptoms Reported: No symptoms reported Endocrine Self-Management Outcome: 4 (good)  Gastrointestinal Gastrointestinal Symptoms  Reported: No symptoms reported Gastrointestinal Self-Management Outcome: 4 (good) Nutrition Risk Screen (CP): No indicators present  Genitourinary Genitourinary Symptoms Reported: No symptoms reported Genitourinary Self-Management Outcome: 4 (good)  Integumentary Integumentary Symptoms Reported: No symptoms reported Skin Self-Management Outcome: 4 (good)  Musculoskeletal Musculoskelatal Symptoms Reviewed: Joint pain, Unsteady gait Musculoskeletal Management Strategies: Exercise, Medical device, Medication therapy, Routine screening Musculoskeletal Self-Management Outcome: 3 (uncertain)      Psychosocial Psychosocial Symptoms Reported: No symptoms reported     Quality of Family Relationships: helpful, involved, supportive Do you feel physically threatened by others?: No    05/26/2024    PHQ2-9 Depression Screening   Little interest or pleasure in doing things Not at all  Feeling down, depressed, or hopeless Not at all  PHQ-2 - Total Score 0  Trouble falling or staying asleep, or sleeping too much    Feeling tired or having little energy    Poor appetite or overeating     Feeling bad about yourself - or that you are a failure or have let yourself or your family down    Trouble concentrating on things, such as reading the newspaper or watching television    Moving or speaking so slowly that other people could have noticed.  Or the opposite - being so fidgety or restless that you have been moving around a lot more than usual    Thoughts that you would be better off dead, or hurting yourself in some way    PHQ2-9 Total Score    If you checked off any problems, how difficult have these problems made it for you to do your work, take care of things  at home, or get along with other people    Depression Interventions/Treatment      There were no vitals filed for this visit.  Medications Reviewed Today   Medications were not reviewed in this encounter     Recommendation:   PCP  Follow-up Specialty provider follow-up oncology 06/05/24, 06/20/24 cardiology 06/28/24 Continue Current Plan of Care  Follow Up Plan:   Telephone follow up appointment date/time:  06/01/24 0900 with A Little RN CM  Hannah [name] L. Ramonita, RN, BSN, CCM [place]  [facility], [facility] RN Care Manager Direct Dial: [phone]  Fax: [phone]

## 2024-05-30 ENCOUNTER — Telehealth: Payer: Self-pay

## 2024-05-30 ENCOUNTER — Other Ambulatory Visit (HOSPITAL_COMMUNITY): Payer: Self-pay | Admitting: Nephrology

## 2024-05-30 DIAGNOSIS — N183 Chronic kidney disease, stage 3 unspecified: Secondary | ICD-10-CM

## 2024-05-30 NOTE — Telephone Encounter (Signed)
   Pre-operative Risk Assessment    Patient Name: Hannah [name]  DOB: 1946-04-18 MRN: [id number]   Date of last office visit: 06/28/23 [name], MD Date of next office visit: 06/28/24 [name], MD   Request for Surgical Clearance    Procedure:  US  KIDNEY BIOPSY  Date of Surgery:  Clearance TBD                                Surgeon:  NOT INDICATED Surgeon's Group or Practice Name:  [facility] RADIOLOGY DEPARTMENT Phone number:  [phone] Fax number:  [phone]  ATTN: [name], [name]   Type of Clearance Requested:   - Medical  - Pharmacy:  Hold Apixaban  (Eliquis ) 2 DAYS PRIOR   Type of Anesthesia:  MODERATE SEDATION   Additional requests/questions:    Signed, [name]   05/30/2024, 3:08 PM

## 2024-06-01 ENCOUNTER — Other Ambulatory Visit: Payer: Self-pay

## 2024-06-01 ENCOUNTER — Telehealth: Payer: Self-pay

## 2024-06-01 DIAGNOSIS — D509 Iron deficiency anemia, unspecified: Secondary | ICD-10-CM | POA: Diagnosis not present

## 2024-06-01 DIAGNOSIS — E669 Obesity, unspecified: Secondary | ICD-10-CM | POA: Diagnosis not present

## 2024-06-01 DIAGNOSIS — K5730 Diverticulosis of large intestine without perforation or abscess without bleeding: Secondary | ICD-10-CM | POA: Diagnosis not present

## 2024-06-01 DIAGNOSIS — K219 Gastro-esophageal reflux disease without esophagitis: Secondary | ICD-10-CM | POA: Diagnosis not present

## 2024-06-01 DIAGNOSIS — R195 Other fecal abnormalities: Secondary | ICD-10-CM | POA: Diagnosis not present

## 2024-06-01 NOTE — Telephone Encounter (Signed)
 Please advise holding Eliquis  prior to enteroscopy on 10/10.  Thank you!  DW

## 2024-06-01 NOTE — Telephone Encounter (Signed)
 Will update the requesting office the pt has appt 06/28/24 with Dr. Kriste for preop clearance.

## 2024-06-01 NOTE — Patient Instructions (Addendum)
 Visit Information  Thank you for taking time to visit with me today. Please don't hesitate to contact me if I can be of assistance to you before our next scheduled appointment.  Your next care management appointment is by telephone on Friday, October 3 at 2:00 PM  Please call the care guide team at [phone] if you need to cancel, schedule, or reschedule an appointment.   Please call 1-800-273-TALK (toll free, 24 hour hotline) if you are experiencing a Mental Health or Behavioral Health Crisis or need someone to talk to.  Clayborne Ly RN BSN CCM [place]  [facility], [facility] Nurse Care Coordinator  Direct Dial: [phone] Website: [email or web address]

## 2024-06-01 NOTE — Telephone Encounter (Signed)
 PT HAS ANOTHER CLEARANCE REQUEST. 2ND REQUEST WAS ENTERED ON 05/30/24 WITH PROCEDURE DATE OF TBD       Pre-operative Risk Assessment    Patient Name: Hannah [name]  DOB: [birth year] MRN: [id number]   Date of last office visit: 06/28/23 [name], MD Date of next office visit: 06/28/24 [name], MD   Request for Surgical Clearance    Procedure:  ENEROSCOPY  Date of Surgery:  Clearance 07/07/24                                Surgeon:  DR ROLLIN Surgeon's Group or Practice Name:  [facility], [place] Phone number:  [phone] Fax number:  [phone]   Type of Clearance Requested:   - Medical  - Pharmacy:  Hold Apixaban  (Eliquis )     Type of Anesthesia:  PROPOFOL    Additional requests/questions:    SignedLucie DELENA Ku   06/01/2024, 4:01 PM

## 2024-06-01 NOTE — Telephone Encounter (Signed)
   Name: Hannah [name]  DOB: 11/20/1945  MRN: [id number]  Primary Cardiologist: [name], MD (Inactive)   Preoperative team, please contact this patient and set up a phone call appointment for further preoperative risk assessment. Please obtain consent and complete medication review. Thank you for your help.  I confirm that guidance regarding antiplatelet and oral anticoagulation therapy has been completed and, if necessary, noted below.  Per Pharm D, patient has not had an Afib/aflutter ablation within the last 3 months or DCCV within the last 30 days. Patient may hold Eliquis  for 2 days prior to procedure.  Patient will not need bridging with Lovenox  around procedure.    I also confirmed the patient resides in the state of [place] . As per [facility] Board telemedicine laws, the patient must reside in the state in which the provider is licensed.    [name], NP 06/01/2024, 1:32 PM [facility]

## 2024-06-01 NOTE — Telephone Encounter (Signed)
 Line busy

## 2024-06-01 NOTE — Telephone Encounter (Signed)
 Pt has a 2nd clearance request for 07/07/24.

## 2024-06-01 NOTE — Telephone Encounter (Signed)
 Patient with diagnosis of atrial fibrillation on Eliquis  for anticoagulation.    Procedure:  US  KIDNEY BIOPSY   Date of Surgery:  Clearance TBD    CHA2DS2-VASc Score = 5   This indicates a 7.2% annual risk of stroke. The patient's score is based upon: CHF History: 0 HTN History: 1 Diabetes History: 1 Stroke History: 0 Vascular Disease History: 0 Age Score: 2 Gender Score: 1    CrCl 30 (with adjusted body weight) Platelet count 201  Patient has not had an Afib/aflutter ablation within the last 3 months or DCCV within the last 30 days  Per office protocol, patient can hold Eliquis  for 2 days prior to procedure.   Patient will not need bridging with Lovenox  (enoxaparin ) around procedure.  **This guidance is not considered finalized until pre-operative APP has relayed final recommendations.**

## 2024-06-01 NOTE — Telephone Encounter (Signed)
   Name: Hannah [name]  DOB: 06-12-1946  MRN: [id number]  Primary Cardiologist: [name], MD (Inactive)  Chart reviewed as part of pre-operative protocol coverage. Because of [name]'s past medical history and time since last visit, she will require a follow-up in-office visit in order to better assess preoperative cardiovascular risk.  Patient has an office visit scheduled on 06/28/2024 with Dr. Kriste. Appointment notes have been updated to reflect need for pre-op evaluation.   Pre-op covering staff:  - Please contact requesting surgeon's office via preferred method (i.e, phone, fax) to inform them of need for appointment prior to surgery.  This message will also be routed to pharmacy pool for input on holding Eliquis  as requested below so that this information is available to the clearing provider at time of patient's appointment.   [name], NP  06/01/2024, 5:05 PM

## 2024-06-01 NOTE — Telephone Encounter (Signed)
 CORRECT PROCEDURE IS : ENTEROSCOPY

## 2024-06-01 NOTE — Patient Outreach (Signed)
 Complex Care Management   Visit Note  06/01/2024  Name:  Hannah [name] MRN: [id number] DOB: 07/05/1946  Situation: Referral received for Complex Care Management related to  RA, OA, SLE, HTN, Hypothyroidism, CKD, s/p right TKA, iron  deficiency Anemia. I obtained verbal consent from Patient.  Visit completed with Patient  on the phone.  Background:   Past Medical History:  Diagnosis Date   Allergy  25 years ago.   Anemia    Arthritis    Asthma    Atrial fibrillation (HCC)    Cataract Had cataracts surgery 2015 & 2016   Chronic kidney disease    Diabetes mellitus without complication (HCC)    Gout 12/17/2014   patient reported   Heart murmur 4 years ago   Hyperlipidemia    Hypertension    Hypothyroidism    Systemic lupus erythematosus (HCC)    Vitiligo     Assessment: Patient Reported Symptoms:  Cognitive Cognitive Status: Normal speech and language skills, Alert and oriented to person, place, and time Cognitive/Intellectual Conditions Management [RPT]: None reported or documented in medical history or problem list   Health Maintenance Behaviors: Healthy diet, Exercise, Annual physical exam, Immunizations, Sleep adequate Healing Pattern: Average Health Facilitated by: Healthy diet, Pain control, Rest  Neurological Neurological Review of Symptoms: Not assessed    HEENT HEENT Symptoms Reported: Not assessed      Cardiovascular Cardiovascular Symptoms Reported: No symptoms reported Does patient have uncontrolled Hypertension?: No Cardiovascular Management Strategies: Adequate rest, Medication therapy, Routine screening, Diet modification Cardiovascular Self-Management Outcome: 4 (good)  Respiratory Respiratory Symptoms Reported: Not assesed    Endocrine Endocrine Symptoms Reported: Not assessed    Gastrointestinal Gastrointestinal Symptoms Reported: Reflux/heartburn Gastrointestinal Management Strategies: Medication therapy, Diet modification Gastrointestinal  Self-Management Outcome: 4 (good)    Genitourinary Genitourinary Symptoms Reported: Not assessed    Integumentary Integumentary Symptoms Reported: No symptoms reported    Musculoskeletal Musculoskelatal Symptoms Reviewed: Joint pain, Unsteady gait Additional Musculoskeletal Details: patient continues to have swelling to both hands with thumb pain and limited ROM to her right knee with some improvement Musculoskeletal Management Strategies: Coping strategies, Medical device, Routine screening, Medication therapy, Adequate rest Musculoskeletal Self-Management Outcome: 4 (good) Falls in the past year?: No Number of falls in past year: 2 or more Was there an injury with Fall?: No Fall Risk Category Calculator: 1 Patient Fall Risk Level: Low Fall Risk    Psychosocial Psychosocial Symptoms Reported: Not assessed     Quality of Family Relationships: involved, helpful, supportive Do you feel physically threatened by others?: No    06/01/2024    PHQ2-9 Depression Screening   Hannah [name] interest or pleasure in doing things    Feeling down, depressed, or hopeless    PHQ-2 - Total Score    Trouble falling or staying asleep, or sleeping too much    Feeling tired or having Hannah [name] energy    Poor appetite or overeating     Feeling bad about yourself - or that you are a failure or have let yourself or your family down    Trouble concentrating on things, such as reading the newspaper or watching television    Moving or speaking so slowly that other people could have noticed.  Or the opposite - being so fidgety or restless that you have been moving around a lot more than usual    Thoughts that you would be better off dead, or hurting yourself in some way    PHQ2-9 Total Score    If you  checked off any problems, how difficult have these problems made it for you to do your work, take care of things at home, or get along with other people    Depression Interventions/Treatment      There were no vitals  filed for this visit.  Medications Reviewed Today     Reviewed by Morgan Clayborne CROME, RN (Registered Nurse) on 06/01/24 at 1041  Med List Status: <None>   Medication Order Taking? Sig Documenting Provider Last Dose Status Informant  Accu-Chek Softclix Lancets lancets [id number]  Use to check blood sugars daily E11.69 Jarold Medici, MD  Active Self  albuterol  (PROVENTIL  HFA;VENTOLIN  HFA) 108 (90 Base) MCG/ACT inhaler [id number]  Inhale 2 puffs into the lungs every 6 (six) hours as needed for wheezing or shortness of breath. Georgina Speaks, FNP  Active Self           Med Note (Keirston Saephanh L   Thu Feb 10, 2024  9:36 AM)    Alcohol  Swabs (ALCOHOL  PADS) 70 % PADS [id number]  Use as directed to check blood sugars 1 time per day dx: e11.22 Jarold Medici, MD  Active Self  allopurinol  (ZYLOPRIM ) 100 MG tablet [id number]  TAKE 1 TABLET BY MOUTH 3 TIMES A WEEK Jarold Medici, MD  Active   amLODipine  (NORVASC ) 5 MG tablet [id number]  TAKE 1 TABLET(5 MG) BY MOUTH AT BEDTIME Jarold Medici, MD  Active   apixaban  (ELIQUIS ) 5 MG TABS tablet [id number]  Take 1 tablet (5 mg total) by mouth 2 (two) times daily. Nahser, Aleene PARAS, MD  Active   Ascorbic Acid (VITAMIN C PO) [id number]  Take 500 mg by mouth daily with lunch. [provider]  Active Self  atorvastatin  (LIPITOR) 80 MG tablet [id number]  TAKE 1 TABLET BY MOUTH EVERY DAY Jarold Medici, MD  Active Self  B Complex-C (B-COMPLEX WITH VITAMIN C) tablet [id number]  Take 1 tablet by mouth daily with lunch. [provider]  Active Self  budesonide -formoterol  (SYMBICORT ) 160-4.5 MCG/ACT inhaler [id number]  INHALE 2 PUFFS BY MOUTH TWICE DAILY IN THE MORNING AND IN THE KARNA Jarold Medici, MD  Active Self  Calcium  Carb-Cholecalciferol 600-800 MG-UNIT TABS [id number]  Take 1 tablet by mouth daily with lunch. [provider]  Active Self  Cholecalciferol (VITAMIN D  PO) [id number]  Take 2,000 Units by mouth daily with lunch. [provider]   Active Self           Med Note (Ryhanna Dunsmore L   Thu Feb 10, 2024  9:35 AM)    dapagliflozin  propanediol (FARXIGA ) 10 MG TABS tablet [id number]  Take 1 tablet (10 mg total) by mouth daily before breakfast. Jarold Medici, MD  Active   denosumab  (PROLIA ) 60 MG/ML SOSY injection [id number]  Inject 60 mg into the skin every 6 (six) months. Courier to rheum: [address], [place] [address]. Appt on 12/07/2022 Dolphus Reiter, MD  Expired 05/11/24 2359 Self           Med Note CHRISTIE ALYSON Schaumann Sep 09, 2023 11:14 AM)    glucose blood (ACCU-CHEK GUIDE) test strip [id number]  USE TO CHECK BLOOD SUGAR DAILY Jarold Medici, MD  Active Self  hydroxychloroquine  (PLAQUENIL ) 200 MG tablet [id number]  TAKE 1 TABLET BY MOUTH TWICE DAILY, MONDAY THROUGH FRIDAY ONLY Cheryl Waddell HERO, PA-C  Active   irbesartan  (AVAPRO ) 300 MG tablet [id number]  TAKE 1 TABLET(300 MG) BY MOUTH DAILY Jarold Medici, MD  Active   metoprolol  succinate (TOPROL -XL) 25 MG  24 hr tablet [id number]  Take 1 tablet (25 mg total) by mouth daily. Please call [phone] to schedule an appointment for future refills. Thank you. Nahser, Aleene PARAS, MD  Active   Multiple Vitamin (MULTIVITAMIN WITH MINERALS) TABS tablet [id number]  Take 1 tablet by mouth daily with lunch. Women's Multivitamin [provider]  Active Self  Polyethyl Glycol-Propyl Glycol (SYSTANE) 0.4-0.3 % SOLN [id number]  Place 1-2 drops into both eyes 3 (three) times daily as needed (dry/irritated eyes.). [provider]  Active Self  SYNTHROID  88 MCG tablet [id number]  TAKE 1 TABLET BY MOUTH EVERY DAY MONDAY TO SATURDAY AND OFF ON SUNDAYS  Patient taking differently: Take 88 mcg by mouth daily before breakfast. TAKE 1 88 MCG TABLET BY MOUTH ON SATURDAY AND SUNDAY AND  TAKE 1 75 MCG TABLET BY MOUTH EVERY DAY MONDAY THROUGH FRIDAY (samples provided by PCP)   Jarold Medici, MD  Active Self            Recommendation:   Specialty provider follow-up  with Nephrologist, Dr. Dennise as directed for renal US  guided CT and biopsy  Follow-up with Oncologist, Dr. Onesimo on 06/20/24 at 1:30 PM for evaluation of your Iron  Deficiency Anemia Follow-up with Cardiologist, Dr. Kriste on 06/28/24 at 09:00 AM with [facility]   Follow Up Plan:   Telephone follow up appointment date/time:  Friday, October 3 at 2:00 PM  [facility] Brandis Matsuura RN BSN CCM [place]  [facility], [facility] Nurse Care Coordinator  Direct Dial: [phone] Website: [email or web address]

## 2024-06-02 ENCOUNTER — Other Ambulatory Visit: Payer: Self-pay

## 2024-06-02 DIAGNOSIS — D472 Monoclonal gammopathy: Secondary | ICD-10-CM

## 2024-06-05 ENCOUNTER — Inpatient Hospital Stay: Attending: Hematology

## 2024-06-05 DIAGNOSIS — M069 Rheumatoid arthritis, unspecified: Secondary | ICD-10-CM | POA: Insufficient documentation

## 2024-06-05 DIAGNOSIS — D472 Monoclonal gammopathy: Secondary | ICD-10-CM | POA: Insufficient documentation

## 2024-06-05 DIAGNOSIS — E1122 Type 2 diabetes mellitus with diabetic chronic kidney disease: Secondary | ICD-10-CM | POA: Insufficient documentation

## 2024-06-05 DIAGNOSIS — M109 Gout, unspecified: Secondary | ICD-10-CM | POA: Diagnosis not present

## 2024-06-05 DIAGNOSIS — Z9842 Cataract extraction status, left eye: Secondary | ICD-10-CM | POA: Diagnosis not present

## 2024-06-05 DIAGNOSIS — M47816 Spondylosis without myelopathy or radiculopathy, lumbar region: Secondary | ICD-10-CM | POA: Diagnosis not present

## 2024-06-05 DIAGNOSIS — M4316 Spondylolisthesis, lumbar region: Secondary | ICD-10-CM | POA: Diagnosis not present

## 2024-06-05 DIAGNOSIS — I4891 Unspecified atrial fibrillation: Secondary | ICD-10-CM | POA: Diagnosis not present

## 2024-06-05 DIAGNOSIS — Z79890 Hormone replacement therapy (postmenopausal): Secondary | ICD-10-CM | POA: Insufficient documentation

## 2024-06-05 DIAGNOSIS — Z91013 Allergy to seafood: Secondary | ICD-10-CM | POA: Insufficient documentation

## 2024-06-05 DIAGNOSIS — Z9841 Cataract extraction status, right eye: Secondary | ICD-10-CM | POA: Insufficient documentation

## 2024-06-05 DIAGNOSIS — M810 Age-related osteoporosis without current pathological fracture: Secondary | ICD-10-CM | POA: Diagnosis not present

## 2024-06-05 DIAGNOSIS — M3214 Glomerular disease in systemic lupus erythematosus: Secondary | ICD-10-CM | POA: Insufficient documentation

## 2024-06-05 DIAGNOSIS — N189 Chronic kidney disease, unspecified: Secondary | ICD-10-CM | POA: Diagnosis not present

## 2024-06-05 DIAGNOSIS — E785 Hyperlipidemia, unspecified: Secondary | ICD-10-CM | POA: Diagnosis not present

## 2024-06-05 DIAGNOSIS — D509 Iron deficiency anemia, unspecified: Secondary | ICD-10-CM | POA: Insufficient documentation

## 2024-06-05 DIAGNOSIS — I129 Hypertensive chronic kidney disease with stage 1 through stage 4 chronic kidney disease, or unspecified chronic kidney disease: Secondary | ICD-10-CM | POA: Insufficient documentation

## 2024-06-05 DIAGNOSIS — Z96653 Presence of artificial knee joint, bilateral: Secondary | ICD-10-CM | POA: Diagnosis not present

## 2024-06-05 DIAGNOSIS — Z7901 Long term (current) use of anticoagulants: Secondary | ICD-10-CM | POA: Diagnosis not present

## 2024-06-05 DIAGNOSIS — E039 Hypothyroidism, unspecified: Secondary | ICD-10-CM | POA: Diagnosis not present

## 2024-06-05 DIAGNOSIS — K922 Gastrointestinal hemorrhage, unspecified: Secondary | ICD-10-CM | POA: Diagnosis not present

## 2024-06-05 DIAGNOSIS — Z79899 Other long term (current) drug therapy: Secondary | ICD-10-CM | POA: Diagnosis not present

## 2024-06-05 LAB — CMP (CANCER CENTER ONLY)
ALT: 11 U/L (ref 0–44)
AST: 19 U/L (ref 15–41)
Albumin: 4 g/dL (ref 3.5–5.0)
Alkaline Phosphatase: 50 U/L (ref 38–126)
Anion gap: 6 (ref 5–15)
BUN: 21 mg/dL (ref 8–23)
CO2: 27 mmol/L (ref 22–32)
Calcium: 8.9 mg/dL (ref 8.9–10.3)
Chloride: 109 mmol/L (ref 98–111)
Creatinine: 1.31 mg/dL — ABNORMAL HIGH (ref 0.44–1.00)
GFR, Estimated: 42 mL/min — ABNORMAL LOW (ref >60)
Glucose, Bld: 108 mg/dL — ABNORMAL HIGH (ref 70–99)
Potassium: 4.4 mmol/L (ref 3.5–5.1)
Sodium: 142 mmol/L (ref 135–145)
Total Bilirubin: 0.3 mg/dL (ref 0.0–1.2)
Total Protein: 6.5 g/dL (ref 6.5–8.1)

## 2024-06-05 LAB — IRON AND IRON BINDING CAPACITY (CC-WL,HP ONLY)
Iron: 33 ug/dL (ref 28–170)
Saturation Ratios: 10 % — ABNORMAL LOW (ref 10.4–31.8)
TIBC: 326 ug/dL (ref 250–450)
UIBC: 293 ug/dL (ref 148–442)

## 2024-06-05 LAB — CBC WITH DIFFERENTIAL (CANCER CENTER ONLY)
Abs Immature Granulocytes: 0.01 K/uL (ref 0.00–0.07)
Basophils Absolute: 0.1 K/uL (ref 0.0–0.1)
Basophils Relative: 1 %
Eosinophils Absolute: 0.4 K/uL (ref 0.0–0.5)
Eosinophils Relative: 8 %
HCT: 27.2 % — ABNORMAL LOW (ref 36.0–46.0)
Hemoglobin: 8.7 g/dL — ABNORMAL LOW (ref 12.0–15.0)
Immature Granulocytes: 0 %
Lymphocytes Relative: 16 %
Lymphs Abs: 0.9 K/uL (ref 0.7–4.0)
MCH: 30.7 pg (ref 26.0–34.0)
MCHC: 32 g/dL (ref 30.0–36.0)
MCV: 96.1 fL (ref 80.0–100.0)
Monocytes Absolute: 0.4 K/uL (ref 0.1–1.0)
Monocytes Relative: 8 %
Neutro Abs: 3.7 K/uL (ref 1.7–7.7)
Neutrophils Relative %: 67 %
Platelet Count: 221 K/uL (ref 150–400)
RBC: 2.83 MIL/uL — ABNORMAL LOW (ref 3.87–5.11)
RDW: 14.2 % (ref 11.5–15.5)
WBC Count: 5.5 K/uL (ref 4.0–10.5)
nRBC: 0 % (ref 0.0–0.2)

## 2024-06-05 LAB — FERRITIN: Ferritin: 36 ng/mL (ref 11–307)

## 2024-06-06 ENCOUNTER — Encounter (HOSPITAL_COMMUNITY): Payer: Self-pay | Admitting: Nephrology

## 2024-06-06 ENCOUNTER — Other Ambulatory Visit (HOSPITAL_COMMUNITY): Payer: Self-pay | Admitting: Nephrology

## 2024-06-06 ENCOUNTER — Telehealth (HOSPITAL_COMMUNITY): Payer: Self-pay

## 2024-06-06 DIAGNOSIS — D631 Anemia in chronic kidney disease: Secondary | ICD-10-CM | POA: Insufficient documentation

## 2024-06-06 LAB — KAPPA/LAMBDA LIGHT CHAINS
Kappa free light chain: 45.5 mg/L — ABNORMAL HIGH (ref 3.3–19.4)
Kappa, lambda light chain ratio: 0.15 — ABNORMAL LOW (ref 0.26–1.65)
Lambda free light chains: 299.7 mg/L — ABNORMAL HIGH (ref 5.7–26.3)

## 2024-06-06 NOTE — Telephone Encounter (Signed)
 2nd request received. Pt has IN OFFICE Preop Appt 06/28/24 with [name], MD  Will route tot he surgeons office

## 2024-06-06 NOTE — Telephone Encounter (Signed)
 Patient with diagnosis of atrial fibrillation on Eliquis  for anticoagulation.    Procedure:  ENEROSCOPY   Date of Surgery:  Clearance 07/07/24   CHA2DS2-VASc Score = 5   This indicates a 7.2% annual risk of stroke. The patient's score is based upon: CHF History: 0 HTN History: 1 Diabetes History: 1 Stroke History: 0 Vascular Disease History: 0 Age Score: 2 Gender Score: 1    CrCl 27 (with adjusted body weight) Platelet count 221  Patient has not had an Afib/aflutter ablation or Watchman within the last 3 months or DCCV within the last 30 days   Per office protocol, patient can hold Eliquis  for 2 days prior to procedure.   Patient will not need bridging with Lovenox  (enoxaparin ) around procedure.  **This guidance is not considered finalized until pre-operative APP has relayed final recommendations.**

## 2024-06-06 NOTE — Telephone Encounter (Signed)
 Auth Submission: NO AUTH NEEDED Site of care: Site of care: [facility] Payer: [employer] Medicare Medication & CPT/J Code(s) submitted: Venofer  (Iron  Sucrose) J1756 Diagnosis Code: D63.1 Route of submission (phone, fax, portal):  Phone # Fax # Auth type: Buy/Bill HB Units/visits requested: 200mg  x 5 doses Reference number:  Approval from: 06/06/24 to 09/27/24

## 2024-06-08 LAB — MULTIPLE MYELOMA PANEL, SERUM
Albumin SerPl Elph-Mcnc: 3.3 g/dL (ref 2.9–4.4)
Albumin/Glob SerPl: 1.3 (ref 0.7–1.7)
Alpha 1: 0.2 g/dL (ref 0.0–0.4)
Alpha2 Glob SerPl Elph-Mcnc: 0.7 g/dL (ref 0.4–1.0)
B-Globulin SerPl Elph-Mcnc: 0.8 g/dL (ref 0.7–1.3)
Gamma Glob SerPl Elph-Mcnc: 0.7 g/dL (ref 0.4–1.8)
Globulin, Total: 2.6 g/dL (ref 2.2–3.9)
IgA: 159 mg/dL (ref 64–422)
IgG (Immunoglobin G), Serum: 802 mg/dL (ref 586–1602)
IgM (Immunoglobulin M), Srm: 48 mg/dL (ref 26–217)
Total Protein ELP: 5.9 g/dL — ABNORMAL LOW (ref 6.0–8.5)

## 2024-06-09 ENCOUNTER — Encounter (HOSPITAL_COMMUNITY)
Admission: RE | Admit: 2024-06-09 | Discharge: 2024-06-09 | Disposition: A | Discharge status: Home / Self Care | Source: Ambulatory Visit | Attending: Nephrology | Admitting: Nephrology

## 2024-06-09 VITALS — BP 132/66 | HR 55 | Temp 98.7°F | Resp 16

## 2024-06-09 DIAGNOSIS — N183 Chronic kidney disease, stage 3 unspecified: Secondary | ICD-10-CM | POA: Diagnosis not present

## 2024-06-09 DIAGNOSIS — D508 Other iron deficiency anemias: Secondary | ICD-10-CM

## 2024-06-09 DIAGNOSIS — D631 Anemia in chronic kidney disease: Secondary | ICD-10-CM | POA: Diagnosis not present

## 2024-06-09 MED ORDER — IRON SUCROSE 200 MG IVPB - SIMPLE MED
200.0000 mg | Freq: Once | Status: AC
  Administered 2024-06-09: 200 mg via INTRAVENOUS
  Filled 2024-06-09: qty 200

## 2024-06-12 ENCOUNTER — Ambulatory Visit: Admitting: Hematology

## 2024-06-12 ENCOUNTER — Encounter (HOSPITAL_COMMUNITY)
Admission: RE | Admit: 2024-06-12 | Discharge: 2024-06-12 | Disposition: A | Discharge status: Home / Self Care | Source: Ambulatory Visit | Attending: Nephrology

## 2024-06-12 VITALS — BP 132/53 | HR 56 | Temp 98.4°F | Resp 16

## 2024-06-12 DIAGNOSIS — D631 Anemia in chronic kidney disease: Secondary | ICD-10-CM | POA: Diagnosis not present

## 2024-06-12 DIAGNOSIS — N183 Chronic kidney disease, stage 3 unspecified: Secondary | ICD-10-CM

## 2024-06-12 DIAGNOSIS — D508 Other iron deficiency anemias: Secondary | ICD-10-CM

## 2024-06-12 MED ORDER — IRON SUCROSE 200 MG IVPB - SIMPLE MED
200.0000 mg | Freq: Once | Status: AC
  Administered 2024-06-12: 200 mg via INTRAVENOUS
  Filled 2024-06-12: qty 200

## 2024-06-13 ENCOUNTER — Other Ambulatory Visit: Payer: Self-pay | Admitting: Gastroenterology

## 2024-06-14 ENCOUNTER — Encounter (HOSPITAL_COMMUNITY)
Admission: RE | Admit: 2024-06-14 | Discharge: 2024-06-14 | Disposition: A | Discharge status: Home / Self Care | Source: Ambulatory Visit | Attending: Nephrology | Admitting: Nephrology

## 2024-06-14 ENCOUNTER — Encounter: Payer: Self-pay | Admitting: Pharmacy Technician

## 2024-06-14 VITALS — BP 125/61 | HR 66 | Temp 98.0°F | Resp 16

## 2024-06-14 DIAGNOSIS — N183 Chronic kidney disease, stage 3 unspecified: Secondary | ICD-10-CM

## 2024-06-14 DIAGNOSIS — D508 Other iron deficiency anemias: Secondary | ICD-10-CM

## 2024-06-14 DIAGNOSIS — D631 Anemia in chronic kidney disease: Secondary | ICD-10-CM | POA: Diagnosis not present

## 2024-06-14 MED ORDER — SODIUM CHLORIDE 0.9 % IV SOLN
200.0000 mg | Freq: Once | INTRAVENOUS | Status: AC
  Administered 2024-06-14: 200 mg via INTRAVENOUS
  Filled 2024-06-14: qty 200

## 2024-06-14 NOTE — Telephone Encounter (Signed)
 Sent mychart to check if she got app  1st attempt   PAP: Patient assistance application for Eliquis  through [employer] ([employer]) has been mailed to pt's home address on file. Provider portion of application will be faxed to provider's office once patient portion has been received.

## 2024-06-14 NOTE — Telephone Encounter (Signed)
 Patient called and said she will try to get in the mail or to the office this week

## 2024-06-16 ENCOUNTER — Encounter (HOSPITAL_COMMUNITY)
Admission: RE | Admit: 2024-06-16 | Discharge: 2024-06-16 | Disposition: A | Discharge status: Home / Self Care | Source: Ambulatory Visit | Attending: Nephrology | Admitting: Nephrology

## 2024-06-16 VITALS — BP 130/56 | HR 60 | Temp 98.2°F | Resp 16

## 2024-06-16 DIAGNOSIS — D631 Anemia in chronic kidney disease: Secondary | ICD-10-CM | POA: Diagnosis not present

## 2024-06-16 DIAGNOSIS — D508 Other iron deficiency anemias: Secondary | ICD-10-CM

## 2024-06-16 DIAGNOSIS — N183 Chronic kidney disease, stage 3 unspecified: Secondary | ICD-10-CM | POA: Diagnosis not present

## 2024-06-16 MED ORDER — IRON SUCROSE 200 MG IVPB - SIMPLE MED
200.0000 mg | Freq: Once | Status: AC
  Administered 2024-06-16: 200 mg via INTRAVENOUS
  Filled 2024-06-16: qty 200

## 2024-06-16 NOTE — Telephone Encounter (Signed)
 Provider portion printed and put in box as requested

## 2024-06-16 NOTE — Telephone Encounter (Signed)
 Hi, scanned in media under ELIQUIS  [employer] PROVIDER BLANK FORM is the provider portion blank eliqius form. Can someone get the provider to sign and fax back to [phone] please? Thank you     Patient dropped off the patient portion and that is scanned in media

## 2024-06-19 ENCOUNTER — Encounter (HOSPITAL_COMMUNITY)
Admission: RE | Admit: 2024-06-19 | Discharge: 2024-06-19 | Disposition: A | Discharge status: Home / Self Care | Source: Ambulatory Visit | Attending: Nephrology

## 2024-06-19 VITALS — BP 136/56 | HR 54 | Temp 98.3°F | Resp 16

## 2024-06-19 DIAGNOSIS — N183 Chronic kidney disease, stage 3 unspecified: Secondary | ICD-10-CM

## 2024-06-19 DIAGNOSIS — D631 Anemia in chronic kidney disease: Secondary | ICD-10-CM | POA: Diagnosis not present

## 2024-06-19 DIAGNOSIS — D508 Other iron deficiency anemias: Secondary | ICD-10-CM

## 2024-06-19 MED ORDER — IRON SUCROSE 200 MG IVPB - SIMPLE MED
200.0000 mg | Freq: Once | Status: AC
  Administered 2024-06-19: 200 mg via INTRAVENOUS
  Filled 2024-06-19: qty 200

## 2024-06-20 ENCOUNTER — Inpatient Hospital Stay: Admitting: Hematology

## 2024-06-20 VITALS — BP 140/53 | HR 64 | Temp 97.0°F | Resp 18 | Wt 161.8 lb

## 2024-06-20 DIAGNOSIS — K922 Gastrointestinal hemorrhage, unspecified: Secondary | ICD-10-CM | POA: Diagnosis not present

## 2024-06-20 DIAGNOSIS — D508 Other iron deficiency anemias: Secondary | ICD-10-CM

## 2024-06-20 DIAGNOSIS — N189 Chronic kidney disease, unspecified: Secondary | ICD-10-CM | POA: Diagnosis not present

## 2024-06-20 DIAGNOSIS — D509 Iron deficiency anemia, unspecified: Secondary | ICD-10-CM | POA: Diagnosis not present

## 2024-06-20 DIAGNOSIS — E1122 Type 2 diabetes mellitus with diabetic chronic kidney disease: Secondary | ICD-10-CM | POA: Diagnosis not present

## 2024-06-20 DIAGNOSIS — I129 Hypertensive chronic kidney disease with stage 1 through stage 4 chronic kidney disease, or unspecified chronic kidney disease: Secondary | ICD-10-CM | POA: Diagnosis not present

## 2024-06-20 DIAGNOSIS — D472 Monoclonal gammopathy: Secondary | ICD-10-CM | POA: Diagnosis not present

## 2024-06-20 DIAGNOSIS — D649 Anemia, unspecified: Secondary | ICD-10-CM | POA: Diagnosis not present

## 2024-06-20 DIAGNOSIS — M810 Age-related osteoporosis without current pathological fracture: Secondary | ICD-10-CM | POA: Diagnosis not present

## 2024-06-20 DIAGNOSIS — E039 Hypothyroidism, unspecified: Secondary | ICD-10-CM | POA: Diagnosis not present

## 2024-06-20 DIAGNOSIS — E785 Hyperlipidemia, unspecified: Secondary | ICD-10-CM | POA: Diagnosis not present

## 2024-06-20 NOTE — Telephone Encounter (Addendum)
 Provider portion scanned in media and in the drawer at work  I havent sent anything to the assistance so that needs to be done. Everything in media

## 2024-06-22 ENCOUNTER — Telehealth

## 2024-06-28 ENCOUNTER — Ambulatory Visit: Attending: Internal Medicine | Admitting: Internal Medicine

## 2024-06-28 ENCOUNTER — Encounter: Payer: Self-pay | Admitting: Internal Medicine

## 2024-06-28 VITALS — BP 132/74 | HR 69 | Ht <= 58 in | Wt 160.4 lb

## 2024-06-28 DIAGNOSIS — R011 Cardiac murmur, unspecified: Secondary | ICD-10-CM | POA: Diagnosis not present

## 2024-06-28 DIAGNOSIS — Z7901 Long term (current) use of anticoagulants: Secondary | ICD-10-CM | POA: Diagnosis not present

## 2024-06-28 DIAGNOSIS — E782 Mixed hyperlipidemia: Secondary | ICD-10-CM | POA: Diagnosis not present

## 2024-06-28 DIAGNOSIS — I10 Essential (primary) hypertension: Secondary | ICD-10-CM

## 2024-06-28 DIAGNOSIS — Z01810 Encounter for preprocedural cardiovascular examination: Secondary | ICD-10-CM

## 2024-06-28 DIAGNOSIS — I480 Paroxysmal atrial fibrillation: Secondary | ICD-10-CM | POA: Diagnosis not present

## 2024-06-28 NOTE — Progress Notes (Signed)
 Cardiology Office Note   Date:  06/28/2024  ID:  [name], [name] 04-Jan-1946, MRN [id number] PCP: Jarold Medici, MD  [facility] Providers Cardiologist:  Emeline FORBES Calender, MD     History of Present Illness Hannah [name] is a 78 y.o. female with a past medical history of hypertension, hyperlipidemia, paroxysmal atrial fibrillation during COVID, GI bleed, type 2 diabetes, hypothyroidism, MGUS, obesity who presents for annual follow-up and preop evaluation.  07/04/24 - Renal biopsy  07/07/24 - enteroscopy  Overall doing well without any exertional chest pain or shortness of breath or lower extremity edema.  No history of stroke.  Not on insulin .  No vascular disease noted.    ROS:  Review of Systems  All other systems reviewed and are negative.   Physical Exam  Physical Exam Vitals and nursing note reviewed.  Constitutional:      Appearance: Normal appearance.  HENT:     Head: Normocephalic and atraumatic.  Eyes:     Conjunctiva/sclera: Conjunctivae normal.  Neck:     Vascular: No carotid bruit.  Cardiovascular:     Rate and Rhythm: Normal rate and regular rhythm.     Heart sounds: Murmur heard.  Pulmonary:     Effort: Pulmonary effort is normal.     Breath sounds: Normal breath sounds.  Musculoskeletal:        General: No swelling or tenderness.  Skin:    Coloration: Skin is not jaundiced or pale.  Neurological:     Mental Status: She is alert.     VS:  BP 132/74 (BP Location: Left Arm, Patient Position: Sitting)   Pulse 69   Ht 4' 10 (1.473 m)   Wt 160 lb 6.4 oz (72.8 kg)   SpO2 99%   BMI 33.52 kg/m         Wt Readings from Last 3 Encounters:  06/28/24 160 lb 6.4 oz (72.8 kg)  06/20/24 161 lb 12.8 oz (73.4 kg)  05/11/24 160 lb (72.6 kg)     EKG Interpretation Date/Time:  Wednesday June 28 2024 08:40:08 EDT Ventricular Rate:  69 PR Interval:  218 QRS Duration:  100 QT Interval:  428 QTC Calculation: 458 R  Axis:   -34  Text Interpretation: Sinus rhythm with 1st degree A-V block Left axis deviation Low voltage QRS Cannot rule out Anteroseptal infarct , age undetermined When compared with ECG of 23-Sep-2023 16:25, Although rate has decreased ST changes have resolved Confirmed by Calender Emeline [phone]) on 06/28/2024 8:48:49 AM    Studies Reviewed   Echocardiogram 03/27/2021: EF 55 to 60% with mild asymmetric left ventricular hypertrophy of the basal septal segment Grade 1 diastolic dysfunction     Risk Assessment/Calculations   CHA2DS2-VASc Score = 5   This indicates a 7.2% annual risk of stroke. The patient's score is based upon: CHF History: 0 HTN History: 1 Diabetes History: 1 Stroke History: 0 Vascular Disease History: 0 Age Score: 2 Gender Score: 1             ASCVD risk score: The 10-year ASCVD risk score (Arnett DK, et al., 2019) is: 28.4%   Values used to calculate the score:     Age: 63 years     Clincally relevant sex: Female     Is Non-Hispanic African American: Yes     Diabetic: Yes     Tobacco smoker: No     Systolic Blood Pressure: 132 mmHg     Is BP treated: Yes  HDL Cholesterol: 58 mg/dL     Total Cholesterol: 131 mg/dL   ASSESSMENT  Preop cardiovascular evaluation for renal biopsy on 07/04/2024 and enteroscopy on 07/07/2024 RCRI: 1.   Paroxysmal atrial fibrillation seems to have a low burden and has been in sinus on multiple EKGs with an unremarkable monitor.  CHA2DS2-VASc: 5.  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  No need for any preoperative cardiovascular testing.  She can hold her Eliquis  2 days prior  to the procedure on 10/7 and continue to hold until after her procedure on 10/10 and restart as soon as possible.  We discussed the risks of stroke while being off of Eliquis  during this time which she understands. Will check an echocardiogram to follow-up on the aortic valve as she may have developed mild aortic stenosis.  This does not need to be done prior to the study Cardiac risk counseling and prevention recommendations: Heart healthy/Mediterranean diet with whole grains, fruits, vegetable, fish, lean meats, nuts, and olive oil. Limit salt. Moderate walking, 3-5 times/week for 30-50 minutes each session. Aim for at least 150 minutes.week. Goal should be pace of 3 miles/hour, or walking 1.5 miles in 30 minutes Avoidance of tobacco products. Avoid excess alcohol .  Follow up: 1 year          Signed, Emeline FORBES Calender, MD

## 2024-06-28 NOTE — Progress Notes (Signed)
 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 is here for follow-up of her iron  deficiency anemia/anemia of chronic inflammation and MGUS.SABRA She continues to follow with Dr. Dolphus for her rheumatoid arthritis and gout and osteoporosis. She notes some intermittent GI bleeding issues.  Has been scheduled for enteroscopy on 07/07/2024. Labs done today were discussed with her in details  REVIEW OF SYSTEMS:   10 Point review of Systems was done is negative except as noted above.  . Past Medical History:  Diagnosis Date   Allergy  25 years ago.   Anemia    Arthritis    Asthma    Atrial fibrillation (HCC)    Cataract Had cataracts surgery 2015 & 2016   Chronic kidney disease    Diabetes mellitus without complication (HCC)    Gout 12/17/2014   patient reported   Heart murmur 4 years ago   Hyperlipidemia    Hypertension    Hypothyroidism    Systemic lupus erythematosus (HCC)    Vitiligo   possible SLE/rheumatoid arthritis following with Dr.Deveshwar.  . Past Surgical History:  Procedure Laterality Date   BIOPSY  09/26/2023   Procedure: BIOPSY;  Surgeon: Aneita Gwendlyn DASEN, MD;  Location: [facility];  Service: Gastroenterology;;   CATARACT EXTRACTION Bilateral 2015   COLONOSCOPY WITH PROPOFOL  N/A 09/27/2023   Procedure: COLONOSCOPY WITH PROPOFOL ;  Surgeon: Kristie Lamprey, MD;  Location: [facility];   Service: Gastroenterology;  Laterality: N/A;   DILATION AND CURETTAGE OF UTERUS     DOPPLER ECHOCARDIOGRAPHY  05/2018   Internist to review with pt; potential heart murmur 06/20/18   ESOPHAGOGASTRODUODENOSCOPY (EGD) WITH PROPOFOL  N/A 09/26/2023   Procedure: ESOPHAGOGASTRODUODENOSCOPY (EGD) WITH PROPOFOL ;  Surgeon: Aneita Gwendlyn DASEN, MD;  Location: [facility];  Service: Gastroenterology;  Laterality: N/A;   EYE SURGERY  Cataract Surgery, 2015 & 2016.   keratosis removal  2021   KNEE CLOSED REDUCTION Left 07/06/2022   Procedure: CLOSED MANIPULATION KNEE;  Surgeon: Melodi Lerner, MD;  Location: [facility];  Service: Orthopedics;  Laterality: Left;   KNEE CLOSED REDUCTION Right 09/13/2023   Procedure: CLOSED MANIPULATION KNEE;  Surgeon: Melodi Lerner, MD;  Location: [facility];  Service: Orthopedics;  Laterality: Right;   REPLACEMENT TOTAL KNEE Left    SKIN SURGERY  11/30/2018   left side of face   TOOTH EXTRACTION     TOTAL KNEE ARTHROPLASTY Left 02/02/2022   Procedure: TOTAL KNEE ARTHROPLASTY;  Surgeon: Melodi Lerner, MD;  Location: [facility];  Service: Orthopedics;  Laterality: Left;   TOTAL KNEE ARTHROPLASTY Right 07/12/2023   Procedure: RIGHT TOTAL KNEE ARTHROPLASTY;  Surgeon: Melodi Lerner, MD;  Location: [facility];  Service: Orthopedics;  Laterality: Right;    . Social History   Tobacco Use   Smoking status: Never    Passive exposure: Never   Smokeless tobacco: Never  Vaping Use   Vaping status: Never Used  Substance Use Topics   Alcohol  use: Not Currently  Comment: Maybe a glass of wine every 3 months.   Drug use: No    ALLERGIES:  is allergic to shellfish allergy  and shellfish-derived products.  MEDICATIONS:  Current Outpatient Medications  Medication Sig Dispense Refill   Accu-Chek Softclix Lancets lancets Use to check blood sugars daily E11.69 100 each 2   albuterol  (PROVENTIL  HFA;VENTOLIN  HFA) 108 (90 Base) MCG/ACT inhaler Inhale 2 puffs into the lungs every 6 (six) hours  as needed for wheezing or shortness of breath. 1 Inhaler 2   Alcohol  Swabs (ALCOHOL  PADS) 70 % PADS Use as directed to check blood sugars 1 time per day dx: e11.22 150 each 2   allopurinol  (ZYLOPRIM ) 100 MG tablet TAKE 1 TABLET BY MOUTH 3 TIMES A WEEK 90 tablet 2   amLODipine  (NORVASC ) 5 MG tablet TAKE 1 TABLET(5 MG) BY MOUTH AT BEDTIME 90 tablet 2   apixaban  (ELIQUIS ) 5 MG TABS tablet Take 1 tablet (5 mg total) by mouth 2 (two) times daily. 60 tablet 11   Ascorbic Acid (VITAMIN C PO) Take 500 mg by mouth daily with lunch.     atorvastatin  (LIPITOR) 80 MG tablet TAKE 1 TABLET BY MOUTH EVERY DAY 90 tablet 2   B Complex-C (B-COMPLEX WITH VITAMIN C) tablet Take 1 tablet by mouth daily with lunch.     budesonide -formoterol  (SYMBICORT ) 160-4.5 MCG/ACT inhaler INHALE 2 PUFFS BY MOUTH TWICE DAILY IN THE MORNING AND IN THE EVENING 10.2 g 6   Calcium  Carb-Cholecalciferol 600-800 MG-UNIT TABS Take 1 tablet by mouth daily with lunch.     Cholecalciferol (VITAMIN D  PO) Take 2,000 Units by mouth daily with lunch.     dapagliflozin  propanediol (FARXIGA ) 10 MG TABS tablet Take 1 tablet (10 mg total) by mouth daily before breakfast. 90 tablet 3   denosumab  (PROLIA ) 60 MG/ML SOSY injection Inject 60 mg into the skin every 6 (six) months. Courier to rheum: [address], [place] [address]. Appt on 12/07/2022 1 mL 0   denosumab  (PROLIA ) 60 MG/ML SOSY injection Inject 60 mg into the skin every 6 (six) months.     glucose blood (ACCU-CHEK GUIDE) test strip USE TO CHECK BLOOD SUGAR DAILY 100 strip 2   hydroxychloroquine  (PLAQUENIL ) 200 MG tablet TAKE 1 TABLET BY MOUTH TWICE DAILY, MONDAY THROUGH FRIDAY ONLY 120 tablet 0   irbesartan  (AVAPRO ) 300 MG tablet TAKE 1 TABLET(300 MG) BY MOUTH DAILY 90 tablet 2   metoprolol  succinate (TOPROL -XL) 25 MG 24 hr tablet Take 1 tablet (25 mg total) by mouth daily. Please call [phone] to schedule an appointment for future refills. Thank you. 90 tablet 0   Multiple  Vitamin (MULTIVITAMIN WITH MINERALS) TABS tablet Take 1 tablet by mouth daily with lunch. Women's Multivitamin     pantoprazole  (PROTONIX ) 40 MG tablet Take 40 mg by mouth every morning.     Polyethyl Glycol-Propyl Glycol (SYSTANE) 0.4-0.3 % SOLN Place 1-2 drops into both eyes 3 (three) times daily as needed (dry/irritated eyes.).     SYNTHROID  88 MCG tablet TAKE 1 TABLET BY MOUTH EVERY DAY MONDAY TO SATURDAY AND OFF ON SUNDAYS (Patient taking differently: Take 88 mcg by mouth daily before breakfast. TAKE 1 88 MCG TABLET BY MOUTH ON SATURDAY AND SUNDAY AND  TAKE 1 75 MCG TABLET BY MOUTH EVERY DAY MONDAY THROUGH FRIDAY (samples provided by PCP)) 90 tablet 0   No current facility-administered medications for this visit.    PHYSICAL EXAMINATION: ECOG PERFORMANCE STATUS: 1 - Symptomatic but completely ambulatory  Vitals:  06/20/24 0920  BP: (!) 140/53  Pulse: 64  Resp: 18  Temp: (!) 97 F (36.1 C)  SpO2: 100%   Filed Weights   06/20/24 0920  Weight: 161 lb 12.8 oz (73.4 kg)  .Body mass index is 33.24 kg/m.   Normal exam  LABORATORY DATA:   I have reviewed the data as listed  .    Latest Ref Rng & Units 06/05/2024    7:37 AM 05/11/2024   11:43 AM 12/01/2023    3:52 PM  CBC  WBC 4.0 - 10.5 K/uL 5.5  5.2  5.6   Hemoglobin 12.0 - 15.0 g/dL 8.7  8.3  89.9   Hematocrit 36.0 - 46.0 % 27.2  26.8  30.6   Platelets 150 - 400 K/uL 221  201  218    .    Latest Ref Rng & Units 06/05/2024    7:37 AM 05/11/2024   11:43 AM 03/15/2024    4:36 PM  CMP  Glucose 70 - 99 mg/dL 891  73  91   BUN 8 - 23 mg/dL 21  23  23    Creatinine 0.44 - 1.00 mg/dL 8.68  8.66  8.81   Sodium 135 - 145 mmol/L 142  140  140   Potassium 3.5 - 5.1 mmol/L 4.4  4.6  4.3   Chloride 98 - 111 mmol/L 109  106  105   CO2 22 - 32 mmol/L 27  23  17    Calcium  8.9 - 10.3 mg/dL 8.9  8.7  9.4   Total Protein 6.5 - 8.1 g/dL 6.5  6.2  6.5   Total Bilirubin 0.0 - 1.2 mg/dL 0.3  0.4  0.3   Alkaline Phos 38 - 126 U/L 50   71    AST 15 - 41 U/L 19  23  21    ALT 0 - 44 U/L 11  14  11     Lab Results  Component Value Date   IRON  33 06/05/2024   TIBC 326 06/05/2024   IRONPCTSAT 10 (L) 06/05/2024   (Iron  and TIBC)  Lab Results  Component Value Date   FERRITIN 36 06/05/2024          RADIOGRAPHIC STUDIES: I have personally reviewed the radiological images as listed and agreed with the findings in the report.  METASTATIC BONE SURVEY 0/02/2015   COMPARISON:  None.   FINDINGS: Heart is normal size. Right diaphragmatic hernia again noted, unchanged. Lungs are clear. No effusions.   No focal lytic lesions within the visualized bony structures or acute bony abnormality. Degenerative changes within the shoulders, lower cervical spine, mid to lower thoracic spine, and lower lumbar spine. Advanced degenerative facet disease in the lower lumbar spine. 9 mm of anterolisthesis of L4 on L5. Endplate sclerosis within L4 and L5. Mild degenerative changes in the hips. Advanced degenerative changes within the knees. 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   IMPRESSION: No focal lytic lesion.   Degenerative joint disease involving multiple joints as described above.   Grade 2 anterolisthesis of L4 on L5 related to facet disease.   ASSESSMENT & PLAN:   78 year old of an American female with   #1 IgG kappa monoclonal gammopathy of undetermined significance. Bone survey X-ray showed no lytic lesions. M spike is 0.6 g/dL in 89/983 and then 9.4h/io in 03/7981. SPEP in May/2017 and 07/2016 showed M proteinwas stable at 0.3g/dl  MGUS likely related to underlying connective tissue disorder.  Bone marrow biopsy does show about 13%  plasma cells however these appear to be polyclonal and likely related to a possible underlying inflammatory disorder. Less likely a biclonal plasma cell dyscrasia.  #2 Normocytic normochromic anemia likely related to chronic inflammation from recent diagnosis of  lupus/rheumatoid arthritis + CKD.  Increased acanthocytes on peripheral blood smear -no overt evidence of liver disease. Could be a marker of some MDS Bone marrow biopsy did not show overt plasma cell dyscrasia or myelodysplastic syndrome. 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 Bone marrow biopsy showed decreased iron  stores. Ferritin and iron  have been trending down, she has not noticed any issues with bruising or bleeding recently. ?absorption issue. I have given her the option of IV iron  which she has previously had without issue. She would like to have this.   PLAN: Discussed lab results from 06/05/2024 in detail with the patient CBC showed a hemoglobin of 8.7 with normal WBC count of 5.5 and normal platelets of 221k CMP with stable creatinine of 1.31 Ferritin of 36 with an iron  saturation of 10% Patient has received IV iron  with her nephrologist already Myeloma panel shows an unmeasurable and protein with immunofixation showing an M spike revealing the presence of monoclonal free lambda light chains. We shall repeat her labs and see her back in 2 months She is following with Dr. Rollin for GI workup to evaluate for GI bleeding and has an enteroscopy scheduled on 07/07/2024 She is following with her cardiologist for management of her blood thinners in the context of GI losses and anemia   FOLLOW-UP:  Phone visit in 2 months with Dr Onesimo Labs 1-2 days prior to phone visit   The total time spent in the appointment was 20 minutes*.  All of the patient's questions were answered with apparent satisfaction. The patient knows to call the clinic with any problems, questions or concerns.   Emaline Onesimo MD MS AAHIVMS [facility] Hematology/Oncology Physician [facility]  .*Total Encounter Time as defined by the Centers for Medicare and Medicaid Services includes, in addition to the face-to-face time of a patient  visit (documented in the note above) non-face-to-face time: obtaining and reviewing outside history, ordering and reviewing medications, tests or procedures, care coordination (communications with other health care professionals or caregivers) and documentation in the medical record.

## 2024-06-28 NOTE — Patient Instructions (Signed)
 Medication Instructions:   Your physician recommends that you continue on your current medications as directed. Please refer to the Current Medication list given to you today.  *If you need a refill on your cardiac medications before your next appointment, please call your pharmacy*   Lab Work: NONE ORDERED  TODAY   If you have labs (blood work) drawn today and your tests are completely normal, you will receive your results only by: MyChart Message (if you have MyChart) OR A paper copy in the mail If you have any lab test that is abnormal or we need to change your treatment, we will call you to review the results.   Testing/Procedures:  Your physician has requested that you have an echocardiogram. Echocardiography is a painless test that uses sound waves to create images of your heart. It provides your doctor with information about the size and shape of your heart and how well your heart's chambers and valves are working. This procedure takes approximately one hour. There are no restrictions for this procedure. Please do NOT wear cologne, perfume, aftershave, or lotions (deodorant is allowed). Please arrive 15 minutes prior to your appointment time.  Please note: We ask at that you not bring children with you during ultrasound (echo/ vascular) testing. Due to room size and safety concerns, children are not allowed in the ultrasound rooms during exams. Our front office staff cannot provide observation of children in our lobby area while testing is being conducted. An adult accompanying a patient to their appointment will only be allowed in the ultrasound room at the discretion of the ultrasound technician under special circumstances. We apologize for any inconvenience.\    Follow-Up: At [facility], you and your health needs are our priority.  As part of our continuing mission to provide you with exceptional heart care, our providers are all part of one team.  This team includes  your primary Cardiologist (physician) and Advanced Practice Providers or APPs (Physician Assistants and Nurse Practitioners) who all work together to provide you with the care you need, when you need it.  Your next appointment:   1 year(s)   Provider:  Dr. Kriste   We recommend signing up for the patient portal called MyChart.  Sign up information is provided on this After Visit Summary.  MyChart is used to connect with patients for Virtual Visits (Telemedicine).  Patients are able to view lab/test results, encounter notes, upcoming appointments, etc.  Non-urgent messages can be sent to your provider as well.   To learn more about what you can do with MyChart, go to [email or web address].   Other Instructions

## 2024-06-30 ENCOUNTER — Encounter (HOSPITAL_COMMUNITY): Payer: Self-pay | Admitting: Gastroenterology

## 2024-06-30 ENCOUNTER — Other Ambulatory Visit: Payer: Self-pay

## 2024-06-30 NOTE — Patient Outreach (Signed)
 Complex Care Management   Visit Note  06/30/2024  Name:  Hannah [name] MRN: [id number] DOB: Nov 26, 1945  Situation: Referral received for Complex Care Management related to  RA, OA, SLE, HTN, Hypothyroidism, CKD, s/p right TKA, iron  deficiency Anemia. I obtained verbal consent from Patient.  Visit completed with Patient on the phone.  Background:   Past Medical History:  Diagnosis Date   Allergy  25 years ago.   Anemia    Arthritis    Asthma    Atrial fibrillation (HCC)    Cataract Had cataracts surgery 2015 & 2016   Chronic kidney disease    Diabetes mellitus without complication (HCC)    Gout 12/17/2014   patient reported   Heart murmur 4 years ago   Hyperlipidemia    Hypertension    Hypothyroidism    Systemic lupus erythematosus (HCC)    Vitiligo     Assessment: Patient Reported Symptoms:  Cognitive Cognitive Status: Alert and oriented to person, place, and time, Normal speech and language skills Cognitive/Intellectual Conditions Management [RPT]: None reported or documented in medical history or problem list   Health Maintenance Behaviors: Annual physical exam, Healthy diet Health Facilitated by: Healthy diet, Rest  Neurological Neurological Review of Symptoms: No symptoms reported    HEENT HEENT Symptoms Reported: No symptoms reported      Cardiovascular Cardiovascular Symptoms Reported: Other: Other Cardiovascular Symptoms: Anemia Does patient have uncontrolled Hypertension?: No Cardiovascular Management Strategies: Medication therapy, Routine screening, Adequate rest Cardiovascular Self-Management Outcome: 4 (good)  Respiratory Respiratory Symptoms Reported: No symptoms reported    Endocrine Endocrine Symptoms Reported: No symptoms reported Is patient diabetic?: No    Gastrointestinal Gastrointestinal Symptoms Reported: No symptoms reported      Genitourinary Genitourinary Symptoms Reported: Other Other Genitourinary Symptoms: Anemia of chronic renal  failure, stage 3 (moderate), unspecified whether stage 3a or 3b CKD Genitourinary Management Strategies:  (Routine Screening) Genitourinary Self-Management Outcome: 3 (uncertain)  Integumentary Integumentary Symptoms Reported: No symptoms reported    Musculoskeletal Musculoskelatal Symptoms Reviewed: Unsteady gait, Joint pain Additional Musculoskeletal Details: s/p right knee replacement with revision x 2 Musculoskeletal Management Strategies: Adequate rest, Medical device, Routine screening Musculoskeletal Self-Management Outcome: 4 (good) Falls in the past year?: No Number of falls in past year: 1 or less Was there an injury with Fall?: No Fall Risk Category Calculator: 0 Patient Fall Risk Level: Low Fall Risk Patient at Risk for Falls Due to: Impaired balance/gait, Orthopedic patient Fall risk Follow up: Education provided, Falls evaluation completed  Psychosocial Psychosocial Symptoms Reported: No symptoms reported   Major Change/Loss/Stressor/Fears (CP): Medical condition, self Techniques to Cope with Loss/Stress/Change: Diversional activities, Support group (family support) Quality of Family Relationships: helpful, involved, supportive Do you feel physically threatened by others?: No    06/30/2024    PHQ2-9 Depression Screening   Din Bookwalter interest or pleasure in doing things    Feeling down, depressed, or hopeless    PHQ-2 - Total Score    Trouble falling or staying asleep, or sleeping too much    Feeling tired or having Shail Urbas energy    Poor appetite or overeating     Feeling bad about yourself - or that you are a failure or have let yourself or your family down    Trouble concentrating on things, such as reading the newspaper or watching television    Moving or speaking so slowly that other people could have noticed.  Or the opposite - being so fidgety or restless that you have been moving around  a lot more than usual    Thoughts that you would be better off dead, or hurting  yourself in some way    PHQ2-9 Total Score    If you checked off any problems, how difficult have these problems made it for you to do your work, take care of things at home, or get along with other people    Depression Interventions/Treatment      There were no vitals filed for this visit.  Medications Reviewed Today     Reviewed by Morgan Clayborne CROME, RN (Registered Nurse) on 06/30/24 at 1418  Med List Status: <None>   Medication Order Taking? Sig Documenting Provider Last Dose Status Informant  Accu-Chek Softclix Lancets lancets [id number]  Use to check blood sugars daily E11.69 Jarold Medici, MD  Active Self  albuterol  (PROVENTIL  HFA;VENTOLIN  HFA) 108 (90 Base) MCG/ACT inhaler [id number]  Inhale 2 puffs into the lungs every 6 (six) hours as needed for wheezing or shortness of breath. Georgina Speaks, FNP  Active Self           Med Note (Denean Pavon L   Thu Feb 10, 2024  9:36 AM)    Alcohol  Swabs (ALCOHOL  PADS) 70 % PADS [id number]  Use as directed to check blood sugars 1 time per day dx: e11.22 Jarold Medici, MD  Active Self  allopurinol  (ZYLOPRIM ) 100 MG tablet [id number]  TAKE 1 TABLET BY MOUTH 3 TIMES A WEEK Jarold Medici, MD  Active   amLODipine  (NORVASC ) 5 MG tablet [id number]  TAKE 1 TABLET(5 MG) BY MOUTH AT BEDTIME Jarold Medici, MD  Active   apixaban  (ELIQUIS ) 5 MG TABS tablet [id number]  Take 1 tablet (5 mg total) by mouth 2 (two) times daily. Nahser, Aleene PARAS, MD  Active   Ascorbic Acid (VITAMIN C PO) [id number]  Take 500 mg by mouth daily with lunch. [provider]  Active Self  atorvastatin  (LIPITOR) 80 MG tablet [id number]  TAKE 1 TABLET BY MOUTH EVERY DAY Jarold Medici, MD  Active Self  B Complex-C (B-COMPLEX WITH VITAMIN C) tablet [id number]  Take 1 tablet by mouth daily with lunch. [provider]  Active Self  budesonide -formoterol  (SYMBICORT ) 160-4.5 MCG/ACT inhaler [id number]  INHALE 2 PUFFS BY MOUTH TWICE DAILY IN THE MORNING AND IN THE KARNA Jarold Medici, MD  Active Self  Calcium  Carb-Cholecalciferol 600-800 MG-UNIT TABS [id number]  Take 1 tablet by mouth daily with lunch. [provider]  Active Self  Cholecalciferol (VITAMIN D  PO) [id number]  Take 2,000 Units by mouth daily with lunch. [provider]  Active Self           Med Note (Cash Duce L   Thu Feb 10, 2024  9:35 AM)    dapagliflozin  propanediol (FARXIGA ) 10 MG TABS tablet [id number]  Take 1 tablet (10 mg total) by mouth daily before breakfast. Jarold Medici, MD  Active   denosumab  (PROLIA ) 60 MG/ML SOSY injection [id number]  Inject 60 mg into the skin every 6 (six) months. Courier to rheum: [address], [place] [address]. Appt on 12/07/2022 Dolphus Reiter, MD  Expired 06/28/24 2359 Self           Med Note CHRISTIE, JEANNETTA   Thu Sep 09, 2023 11:14 AM)    denosumab  (PROLIA ) 60 MG/ML SOSY injection [id number]  Inject 60 mg into the skin every 6 (six) months. [provider]  Active   glucose blood (ACCU-CHEK GUIDE) test strip [id number]  USE TO CHECK BLOOD SUGAR DAILY Jarold,  Catheryn, MD  Active Self  hydroxychloroquine  (PLAQUENIL ) 200 MG tablet [id number]  TAKE 1 TABLET BY MOUTH TWICE DAILY, MONDAY THROUGH FRIDAY ONLY Cheryl Waddell HERO, PA-C  Active   irbesartan  (AVAPRO ) 300 MG tablet [id number]  TAKE 1 TABLET(300 MG) BY MOUTH DAILY Jarold Catheryn, MD  Active   metoprolol  succinate (TOPROL -XL) 25 MG 24 hr tablet [id number]  Take 1 tablet (25 mg total) by mouth daily. Please call [phone] to schedule an appointment for future refills. Thank you. Nahser, Aleene PARAS, MD  Active   Multiple Vitamin (MULTIVITAMIN WITH MINERALS) TABS tablet [id number]  Take 1 tablet by mouth daily with lunch. Women's Multivitamin [provider]  Active Self  pantoprazole  (PROTONIX ) 40 MG tablet [id number]  Take 40 mg by mouth every morning. [provider]  Active   Polyethyl Glycol-Propyl Glycol (SYSTANE) 0.4-0.3 % SOLN [id number]  Place 1-2 drops into  both eyes 3 (three) times daily as needed (dry/irritated eyes.). [provider]  Active Self  SYNTHROID  88 MCG tablet [id number]  TAKE 1 TABLET BY MOUTH EVERY DAY MONDAY TO SATURDAY AND OFF ON SUNDAYS  Patient taking differently: Take 88 mcg by mouth daily before breakfast. TAKE 1 88 MCG TABLET BY MOUTH ON SATURDAY AND SUNDAY AND  TAKE 1 75 MCG TABLET BY MOUTH EVERY DAY MONDAY THROUGH FRIDAY (samples provided by PCP)   Jarold Catheryn, MD  Active Self            Recommendation:   Specialty provider follow-up on 07/04/24 at 08:00 AM with [facility]  2Department:[facility] Telephone visit for AWV on 07/05/24 at 2:40 PM with  NADENE MASH VISIT Department: [facility]  Enteroscopy per Dr. Rollin scheduled for 07/07/24 at [facility]  Follow Up Plan:   Telephone follow up appointment date/time:  Tuesday, October 28 at 09:30 AM  Clayborne Ly RN BSN CCM [place]  [facility], [facility] Nurse Care Coordinator  Direct Dial: [phone] Website: [email or web address]

## 2024-06-30 NOTE — Patient Instructions (Signed)
 Visit Information  Thank you for taking time to visit with me today. Please don't hesitate to contact me if I can be of assistance to you before our next scheduled appointment.  Your next care management appointment is by telephone on Tuesday, October 28 at 09:30 AM  Please call the care guide team at [phone] if you need to cancel, schedule, or reschedule an appointment.   Please call 1-800-273-TALK (toll free, 24 hour hotline) if you are experiencing a Mental Health or Behavioral Health Crisis or need someone to talk to.  Clayborne Ly RN BSN CCM [place]  [facility], [facility] Nurse Care Coordinator  Direct Dial: [phone] Website: [email or web address]

## 2024-07-03 ENCOUNTER — Other Ambulatory Visit (HOSPITAL_COMMUNITY): Payer: Self-pay

## 2024-07-03 ENCOUNTER — Other Ambulatory Visit: Payer: Self-pay | Admitting: Student

## 2024-07-03 ENCOUNTER — Telehealth: Payer: Self-pay | Admitting: Pharmacist

## 2024-07-03 DIAGNOSIS — N184 Chronic kidney disease, stage 4 (severe): Secondary | ICD-10-CM

## 2024-07-03 NOTE — H&P (Signed)
 Chief Complaint: Proteinuria. Request is for random renal biopsy   Referring Physician(s): Singh,Vikas  Supervising Physician: Philip Cornet  Patient Status: [facility] - Out-pt  History of Present Illness: Hannah [name] is a 78 y.o. female  outpatient. History of asthma, MGUS, DM, HTN,HLD, a fib (on Eliquis ), SLE, CKD, iron  deficiency anemia. Found to have a increase in urine protein ration. Team is requesting a random renal biopsy.   Currently without any significant complaints. Patient alert and laying in bed,calm. Denies any fevers, headache, chest pain, SOB, cough, abdominal pain, nausea, vomiting or bleeding.   Labs pending. Patient is on eliquis . Last dose given on 10.5.25. Allergies include shellfish. Reaction anaphylaxis. Patient has been NPO since midnight.   Return precautions and treatment recommendations and follow-up discussed with the patient  who is agreeable with the plan.   Past Medical History:  Diagnosis Date   Allergy  25 years ago.   Anemia    Arthritis    Asthma    Atrial fibrillation (HCC)    Cataract Had cataracts surgery 2015 & 2016   Chronic kidney disease    Diabetes mellitus without complication (HCC)    Gout 12/17/2014   patient reported   Heart murmur 4 years ago   Hyperlipidemia    Hypertension    Hypothyroidism    Systemic lupus erythematosus (HCC)    Vitiligo     Past Surgical History:  Procedure Laterality Date   BIOPSY  09/26/2023   Procedure: BIOPSY;  Surgeon: Aneita Gwendlyn DASEN, MD;  Location: [facility];  Service: Gastroenterology;;   CATARACT EXTRACTION Bilateral 2015   COLONOSCOPY WITH PROPOFOL  N/A 09/27/2023   Procedure: COLONOSCOPY WITH PROPOFOL ;  Surgeon: Kristie Lamprey, MD;  Location: [facility];  Service: Gastroenterology;  Laterality: N/A;   DILATION AND CURETTAGE OF UTERUS     DOPPLER ECHOCARDIOGRAPHY  05/2018   Internist to review with pt; potential heart murmur 06/20/18   ESOPHAGOGASTRODUODENOSCOPY (EGD) WITH PROPOFOL   N/A 09/26/2023   Procedure: ESOPHAGOGASTRODUODENOSCOPY (EGD) WITH PROPOFOL ;  Surgeon: Aneita Gwendlyn DASEN, MD;  Location: [facility];  Service: Gastroenterology;  Laterality: N/A;   EYE SURGERY  Cataract Surgery, 2015 & 2016.   keratosis removal  2021   KNEE CLOSED REDUCTION Left 07/06/2022   Procedure: CLOSED MANIPULATION KNEE;  Surgeon: Melodi Lerner, MD;  Location: [facility];  Service: Orthopedics;  Laterality: Left;   KNEE CLOSED REDUCTION Right 09/13/2023   Procedure: CLOSED MANIPULATION KNEE;  Surgeon: Melodi Lerner, MD;  Location: [facility];  Service: Orthopedics;  Laterality: Right;   REPLACEMENT TOTAL KNEE Left    SKIN SURGERY  11/30/2018   left side of face   TOOTH EXTRACTION     TOTAL KNEE ARTHROPLASTY Left 02/02/2022   Procedure: TOTAL KNEE ARTHROPLASTY;  Surgeon: Melodi Lerner, MD;  Location: [facility];  Service: Orthopedics;  Laterality: Left;   TOTAL KNEE ARTHROPLASTY Right 07/12/2023   Procedure: RIGHT TOTAL KNEE ARTHROPLASTY;  Surgeon: Melodi Lerner, MD;  Location: [facility];  Service: Orthopedics;  Laterality: Right;    Allergies: Shellfish allergy  and Shellfish-derived products  Medications: Prior to Admission medications   Medication Sig Start Date End Date Taking? Authorizing Provider  Accu-Chek Softclix Lancets lancets Use to check blood sugars daily E11.69 11/04/20   Jarold Medici, MD  albuterol  (PROVENTIL  HFA;VENTOLIN  HFA) 108 (90 Base) MCG/ACT inhaler Inhale 2 puffs into the lungs every 6 (six) hours as needed for wheezing or shortness of breath. 09/02/18   Georgina Speaks, FNP  Alcohol  Swabs (ALCOHOL  PADS) 70 % PADS  Use as directed to check blood sugars 1 time per day dx: e11.22 08/03/22   Jarold Medici, MD  allopurinol  (ZYLOPRIM ) 100 MG tablet TAKE 1 TABLET BY MOUTH 3 TIMES A WEEK 02/07/24   Jarold Medici, MD  amLODipine  (NORVASC ) 5 MG tablet TAKE 1 TABLET(5 MG) BY MOUTH AT BEDTIME 05/22/24   Jarold Medici, MD  apixaban  (ELIQUIS ) 5 MG TABS tablet Take 1 tablet (5 mg total)  by mouth 2 (two) times daily. 01/11/24 01/10/25  Nahser, Aleene PARAS, MD  Ascorbic Acid (VITAMIN C PO) Take 500 mg by mouth daily with lunch.    [provider]  atorvastatin  (LIPITOR) 80 MG tablet TAKE 1 TABLET BY MOUTH EVERY DAY 07/12/23   Jarold Medici, MD  B Complex-C (B-COMPLEX WITH VITAMIN C) tablet Take 1 tablet by mouth daily with lunch.    [provider]  budesonide -formoterol  (SYMBICORT ) 160-4.5 MCG/ACT inhaler INHALE 2 PUFFS BY MOUTH TWICE DAILY IN THE MORNING AND IN THE EVENING 09/08/22   Jarold Medici, MD  Calcium  Carb-Cholecalciferol 600-800 MG-UNIT TABS Take 1 tablet by mouth daily with lunch.    [provider]  Cholecalciferol (VITAMIN D  PO) Take 2,000 Units by mouth daily with lunch.    [provider]  dapagliflozin  propanediol (FARXIGA ) 10 MG TABS tablet Take 1 tablet (10 mg total) by mouth daily before breakfast. 04/17/24   Jarold Medici, MD  denosumab  (PROLIA ) 60 MG/ML SOSY injection Inject 60 mg into the skin every 6 (six) months. Courier to rheum: [address], [place] [address]. Appt on 12/07/2022 11/26/22 06/28/24  Dolphus Reiter, MD  denosumab  (PROLIA ) 60 MG/ML SOSY injection Inject 60 mg into the skin every 6 (six) months.    [provider]  glucose blood (ACCU-CHEK GUIDE) test strip USE TO CHECK BLOOD SUGAR DAILY 02/23/23   Jarold Medici, MD  hydroxychloroquine  (PLAQUENIL ) 200 MG tablet TAKE 1 TABLET BY MOUTH TWICE DAILY, MONDAY THROUGH FRIDAY ONLY 03/16/24   Cheryl Waddell HERO, PA-C  irbesartan  (AVAPRO ) 300 MG tablet TAKE 1 TABLET(300 MG) BY MOUTH DAILY 01/04/24   Jarold Medici, MD  metoprolol  succinate (TOPROL -XL) 25 MG 24 hr tablet Take 1 tablet (25 mg total) by mouth daily. Please call [phone] to schedule an appointment for future refills. Thank you. 04/07/24   Nahser, Aleene PARAS, MD  Multiple Vitamin (MULTIVITAMIN WITH MINERALS) TABS tablet Take 1 tablet by mouth daily with lunch. Women's Multivitamin    [provider]  pantoprazole  (PROTONIX ) 40 MG tablet Take 40 mg by mouth every morning.    [provider]  Polyethyl Glycol-Propyl Glycol (SYSTANE) 0.4-0.3 % SOLN Place 1-2 drops into both eyes 3 (three) times daily as needed (dry/irritated eyes.).    [provider]  SYNTHROID  88 MCG tablet TAKE 1 TABLET BY MOUTH EVERY DAY MONDAY TO SATURDAY AND OFF ON SUNDAYS Patient taking differently: Take 88 mcg by mouth daily before breakfast. TAKE 1 88 MCG TABLET BY MOUTH ON SATURDAY AND SUNDAY AND  TAKE 1 75 MCG TABLET BY MOUTH EVERY DAY MONDAY THROUGH FRIDAY (samples provided by PCP) 07/29/22   Jarold Medici, MD     Family History  Problem Relation Age of Onset   Heart disease Father    Diabetes Father    Hypertension Mother    Obesity Mother    Varicose Veins Mother    Hypertension Sister    Hypertension Sister    Leukemia Sister    Cancer Sister    Obesity Sister  Social History   Socioeconomic History   Marital status: Single    Spouse name: Not on file   Number of children: 0   Years of education: Not on file   Highest education level: Bachelor's degree (e.g., BA, AB, BS)  Occupational History   Occupation: retired  Tobacco Use   Smoking status: Never    Passive exposure: Never   Smokeless tobacco: Never  Vaping Use   Vaping status: Never Used  Substance and Sexual Activity   Alcohol  use: Not Currently    Comment: Maybe a glass of wine every 3 months.   Drug use: No   Sexual activity: Not Currently    Birth control/protection: None    Comment: 1ST intercourse- 19 , partners- 6,   Other Topics Concern   Not on file  Social History Narrative   Not on file   Social Drivers of Health   Financial Resource Strain: Low Risk  (03/02/2024)   Overall Financial Resource Strain (CARDIA)    Difficulty of Paying Living Expenses: Not hard at all  Food Insecurity: No Food Insecurity (06/30/2024)   Hunger Vital Sign    Worried About Running Out of Food in the  Last Year: Never true    Ran Out of Food in the Last Year: Never true  Transportation Needs: No Transportation Needs (06/30/2024)   PRAPARE - Administrator, Civil Service (Medical): No    Lack of Transportation (Non-Medical): No  Physical Activity: Sufficiently Active (02/10/2024)   Exercise Vital Sign    Days of Exercise per Week: 3 days    Minutes of Exercise per Session: 50 min  Stress: No Stress Concern Present (02/10/2024)   [employer] of Occupational Health - Occupational Stress Questionnaire    Feeling of Stress : Only a little  Social Connections: Moderately Integrated (05/26/2024)   Social Connection and Isolation Panel    Frequency of Communication with Friends and Family: More than three times a week    Frequency of Social Gatherings with Friends and Family: More than three times a week    Attends Religious Services: 1 to 4 times per year    Active Member of [employer] or Organizations: Yes    Attends Engineer, structural: More than 4 times per year    Marital Status: Never married     Review of Systems: A 12 point ROS discussed and pertinent positives are indicated in the HPI above.  All other systems are negative.  Review of Systems  Constitutional:  Negative for fatigue and fever.  HENT:  Negative for congestion.   Respiratory:  Negative for cough and shortness of breath.   Gastrointestinal:  Negative for abdominal pain, diarrhea, nausea and vomiting.    Vital Signs: BP 128/68   Pulse (!) 55   Temp 97.7 F (36.5 C) (Oral)   Resp 20   Ht 4' 10.5 (1.486 m)   Wt 160 lb (72.6 kg)   SpO2 100%   BMI 32.87 kg/m   Advance Care Plan: The advanced care plan/surrogate decision maker was discussed at the time of visit and the patient did not wish to discuss or was not able to name a surrogate decision maker or provide an advance care plan.    Physical Exam Vitals and nursing note reviewed.  Constitutional:      Appearance: She is  well-developed.  HENT:     Head: Normocephalic and atraumatic.  Eyes:     Conjunctiva/sclera: Conjunctivae normal.  Pulmonary:  Effort: Pulmonary effort is normal.  Musculoskeletal:        General: Normal range of motion.     Cervical back: Normal range of motion.  Skin:    General: Skin is warm.  Neurological:     Mental Status: She is alert and oriented to person, place, and time.     Imaging: No results found.  Labs:  CBC: Recent Labs    12/01/23 1552 05/11/24 1143 06/05/24 0737 07/04/24 0725  WBC 5.6 5.2 5.5 4.3  HGB 10.0* 8.3* 8.7* 8.4*  HCT 30.6* 26.8* 27.2* 27.2*  PLT 218 201 221 142*    COAGS: Recent Labs    09/23/23 1800 07/04/24 0725  INR 1.9* 1.0    BMP: Recent Labs    09/27/23 0432 09/28/23 0434 10/05/23 1244 11/25/23 0841 12/01/23 1552 03/15/24 1636 05/11/24 1143 06/05/24 0737  NA 138 133*   < > 142 143 140 140 142  K 3.8 3.7   < > 4.3 4.7 4.3 4.6 4.4  CL 109 107   < > 109 107 105 106 109  CO2 20* 19*   < > 28 29 17* 23 27  GLUCOSE 85 80   < > 109* 82 91 73 108*  BUN 12 12   < > 21 17 23 23 21   CALCIUM  7.7* 7.4*   < > 9.3 9.8 9.4 8.7 8.9  CREATININE 0.96 0.89   < > 1.20* 1.24* 1.18* 1.33* 1.31*  GFRNONAA >60 >60  --  46*  --   --   --  42*   < > = values in this interval not displayed.    LIVER FUNCTION TESTS: Recent Labs    10/05/23 1244 11/25/23 0841 12/01/23 1552 03/15/24 1636 05/11/24 1143 06/05/24 0737  BILITOT 0.6 0.4 0.5 0.3 0.4 0.3  AST 20 21 18 21 23 19   ALT 9 12 11 11 14 11   ALKPHOS 78 64  --  71  --  50  PROT 6.2 6.8 6.5 6.5 6.2 6.5  ALBUMIN 4.0 4.0  --  4.1  --  4.0    TUMOR MARKERS: No results for input(s): AFPTM, CEA, CA199, CHROMGRNA in the last 8760 hours.  Assessment and Plan:  78 y.o. Female outpatient. History of asthma, MGUS, DM, HTN,HLD, a fib (on Eliquis ), SLE, CKD, iron  deficiency anemia. Found to have a increase in urine protein ration. Team is requesting a random renal biopsy.     PLAN: IR Image Guided Random Liver Biopsy   Risks and benefits of renal biopsy  was discussed with the patient and/or patient's family including, but not limited to bleeding, infection, damage to adjacent structures or low yield requiring additional tests.  All of the questions were answered and there is agreement to proceed.  Consent signed and in chart.  Thank you for this interesting consult.  I greatly enjoyed meeting Cinthia M Kunde and look forward to participating in their care.  A copy of this report was sent to the requesting provider on this date.  Electronically Signed: Delon JAYSON Beagle, NP 07/04/2024, 8:24 AM   I spent a total of 20 Minutes    in face to face in clinical consultation, greater than 50% of which was counseling/coordinating care for random renal biopsy

## 2024-07-03 NOTE — Telephone Encounter (Signed)
 Patient due for Prolia  on 07/16/24. She had wanted to discontinue treatment due to cost in April 2025. Plan was to discuss at OV however does not appear this was discussed  Sherry Pennant, PharmD, MPH, BCPS, CPP Clinical Pharmacist [facility])

## 2024-07-04 ENCOUNTER — Observation Stay (HOSPITAL_COMMUNITY)
Admission: AD | Admit: 2024-07-04 | Discharge: 2024-07-05 | Disposition: A | Discharge status: Home / Self Care | Source: Ambulatory Visit | Attending: Internal Medicine | Admitting: Internal Medicine

## 2024-07-04 ENCOUNTER — Other Ambulatory Visit (HOSPITAL_COMMUNITY): Payer: Self-pay

## 2024-07-04 ENCOUNTER — Encounter (HOSPITAL_COMMUNITY): Payer: Self-pay

## 2024-07-04 ENCOUNTER — Other Ambulatory Visit: Payer: Self-pay

## 2024-07-04 ENCOUNTER — Ambulatory Visit (HOSPITAL_COMMUNITY)
Admission: RE | Admit: 2024-07-04 | Discharge: 2024-07-04 | Disposition: A | Discharge status: Home / Self Care | Source: Ambulatory Visit | Attending: Nephrology | Admitting: Nephrology

## 2024-07-04 DIAGNOSIS — N1831 Chronic kidney disease, stage 3a: Secondary | ICD-10-CM | POA: Diagnosis not present

## 2024-07-04 DIAGNOSIS — I480 Paroxysmal atrial fibrillation: Secondary | ICD-10-CM | POA: Diagnosis not present

## 2024-07-04 DIAGNOSIS — Z7901 Long term (current) use of anticoagulants: Secondary | ICD-10-CM | POA: Insufficient documentation

## 2024-07-04 DIAGNOSIS — M069 Rheumatoid arthritis, unspecified: Secondary | ICD-10-CM | POA: Insufficient documentation

## 2024-07-04 DIAGNOSIS — D509 Iron deficiency anemia, unspecified: Secondary | ICD-10-CM | POA: Insufficient documentation

## 2024-07-04 DIAGNOSIS — E1122 Type 2 diabetes mellitus with diabetic chronic kidney disease: Secondary | ICD-10-CM | POA: Insufficient documentation

## 2024-07-04 DIAGNOSIS — M3214 Glomerular disease in systemic lupus erythematosus: Secondary | ICD-10-CM | POA: Insufficient documentation

## 2024-07-04 DIAGNOSIS — Z79899 Other long term (current) drug therapy: Secondary | ICD-10-CM | POA: Insufficient documentation

## 2024-07-04 DIAGNOSIS — N99841 Postprocedural hematoma of a genitourinary system organ or structure following other procedure: Secondary | ICD-10-CM | POA: Insufficient documentation

## 2024-07-04 DIAGNOSIS — E785 Hyperlipidemia, unspecified: Secondary | ICD-10-CM | POA: Insufficient documentation

## 2024-07-04 DIAGNOSIS — R310 Gross hematuria: Secondary | ICD-10-CM

## 2024-07-04 DIAGNOSIS — R809 Proteinuria, unspecified: Secondary | ICD-10-CM | POA: Insufficient documentation

## 2024-07-04 DIAGNOSIS — Z87898 Personal history of other specified conditions: Secondary | ICD-10-CM | POA: Diagnosis not present

## 2024-07-04 DIAGNOSIS — Y848 Other medical procedures as the cause of abnormal reaction of the patient, or of later complication, without mention of misadventure at the time of the procedure: Secondary | ICD-10-CM | POA: Insufficient documentation

## 2024-07-04 DIAGNOSIS — I4891 Unspecified atrial fibrillation: Secondary | ICD-10-CM | POA: Insufficient documentation

## 2024-07-04 DIAGNOSIS — T8189XA Other complications of procedures, not elsewhere classified, initial encounter: Secondary | ICD-10-CM | POA: Insufficient documentation

## 2024-07-04 DIAGNOSIS — D472 Monoclonal gammopathy: Secondary | ICD-10-CM | POA: Insufficient documentation

## 2024-07-04 DIAGNOSIS — Z7984 Long term (current) use of oral hypoglycemic drugs: Secondary | ICD-10-CM | POA: Insufficient documentation

## 2024-07-04 DIAGNOSIS — N184 Chronic kidney disease, stage 4 (severe): Secondary | ICD-10-CM

## 2024-07-04 DIAGNOSIS — Z794 Long term (current) use of insulin: Secondary | ICD-10-CM | POA: Diagnosis not present

## 2024-07-04 DIAGNOSIS — D649 Anemia, unspecified: Secondary | ICD-10-CM | POA: Diagnosis not present

## 2024-07-04 DIAGNOSIS — J45909 Unspecified asthma, uncomplicated: Secondary | ICD-10-CM | POA: Insufficient documentation

## 2024-07-04 DIAGNOSIS — E039 Hypothyroidism, unspecified: Secondary | ICD-10-CM | POA: Diagnosis not present

## 2024-07-04 DIAGNOSIS — I129 Hypertensive chronic kidney disease with stage 1 through stage 4 chronic kidney disease, or unspecified chronic kidney disease: Secondary | ICD-10-CM | POA: Insufficient documentation

## 2024-07-04 DIAGNOSIS — N183 Chronic kidney disease, stage 3 unspecified: Secondary | ICD-10-CM | POA: Diagnosis not present

## 2024-07-04 DIAGNOSIS — K661 Hemoperitoneum: Principal | ICD-10-CM | POA: Insufficient documentation

## 2024-07-04 DIAGNOSIS — R319 Hematuria, unspecified: Secondary | ICD-10-CM | POA: Diagnosis present

## 2024-07-04 LAB — CBC
HCT: 27.2 % — ABNORMAL LOW (ref 36.0–46.0)
HCT: 27.1 % — ABNORMAL LOW (ref 36.0–46.0)
Hemoglobin: 8.4 g/dL — ABNORMAL LOW (ref 12.0–15.0)
Hemoglobin: 8.3 g/dL — ABNORMAL LOW (ref 12.0–15.0)
MCH: 31.8 pg (ref 26.0–34.0)
MCH: 31.2 pg (ref 26.0–34.0)
MCHC: 30.9 g/dL (ref 30.0–36.0)
MCHC: 30.6 g/dL (ref 30.0–36.0)
MCV: 103 fL — ABNORMAL HIGH (ref 80.0–100.0)
MCV: 101.9 fL — ABNORMAL HIGH (ref 80.0–100.0)
Platelets: 142 K/uL — ABNORMAL LOW (ref 150–400)
Platelets: 182 K/uL (ref 150–400)
RBC: 2.64 MIL/uL — ABNORMAL LOW (ref 3.87–5.11)
RBC: 2.66 MIL/uL — ABNORMAL LOW (ref 3.87–5.11)
RDW: 16.3 % — ABNORMAL HIGH (ref 11.5–15.5)
RDW: 15.9 % — ABNORMAL HIGH (ref 11.5–15.5)
WBC: 4.3 K/uL (ref 4.0–10.5)
WBC: 4.7 K/uL (ref 4.0–10.5)
nRBC: 0 % (ref 0.0–0.2)
nRBC: 0 % (ref 0.0–0.2)

## 2024-07-04 LAB — PROTIME-INR
INR: 1 (ref 0.8–1.2)
Prothrombin Time: 13.9 s (ref 11.4–15.2)

## 2024-07-04 LAB — GLUCOSE, CAPILLARY
Glucose-Capillary: 76 mg/dL (ref 70–99)
Glucose-Capillary: 115 mg/dL — ABNORMAL HIGH (ref 70–99)

## 2024-07-04 MED ORDER — INSULIN ASPART 100 UNIT/ML IJ SOLN: 0.0000 [IU] | Freq: Three times a day (TID) | INTRAMUSCULAR | Status: DC

## 2024-07-04 MED ORDER — ACETAMINOPHEN 325 MG PO TABS: 650.0000 mg | ORAL_TABLET | Freq: Four times a day (QID) | ORAL | Status: DC | PRN

## 2024-07-04 MED ORDER — METOPROLOL SUCCINATE ER 25 MG PO TB24
25.0000 mg | ORAL_TABLET | Freq: Every day | ORAL | Status: DC
  Administered 2024-07-05: 25 mg via ORAL
  Filled 2024-07-04: qty 1

## 2024-07-04 MED ORDER — FLUTICASONE FUROATE-VILANTEROL 200-25 MCG/ACT IN AEPB
1.0000 | INHALATION_SPRAY | Freq: Every day | RESPIRATORY_TRACT | Status: DC
  Filled 2024-07-04: qty 28

## 2024-07-04 MED ORDER — MIDAZOLAM HCL 2 MG/2ML IJ SOLN
INTRAMUSCULAR | Status: AC | PRN
  Administered 2024-07-04: 1 mg via INTRAVENOUS
  Administered 2024-07-04 (×2): .5 mg via INTRAVENOUS

## 2024-07-04 MED ORDER — SODIUM CHLORIDE 0.9% FLUSH
3.0000 mL | Freq: Two times a day (BID) | INTRAVENOUS | Status: DC
  Administered 2024-07-04 – 2024-07-05 (×2): 3 mL via INTRAVENOUS

## 2024-07-04 MED ORDER — LIDOCAINE HCL (PF) 1 % IJ SOLN
10.0000 mL | Freq: Once | INTRAMUSCULAR | Status: AC
  Administered 2024-07-04: 10 mL via INTRADERMAL

## 2024-07-04 MED ORDER — SODIUM CHLORIDE 0.9 % IV SOLN: INTRAVENOUS | Status: DC

## 2024-07-04 MED ORDER — HYDROXYCHLOROQUINE SULFATE 200 MG PO TABS
200.0000 mg | ORAL_TABLET | Freq: Two times a day (BID) | ORAL | Status: DC
  Administered 2024-07-04 – 2024-07-05 (×2): 200 mg via ORAL
  Filled 2024-07-04 (×2): qty 1

## 2024-07-04 MED ORDER — FENTANYL CITRATE (PF) 100 MCG/2ML IJ SOLN
INTRAMUSCULAR | Status: AC | PRN
  Administered 2024-07-04: 50 ug via INTRAVENOUS
  Administered 2024-07-04 (×2): 25 ug via INTRAVENOUS

## 2024-07-04 MED ORDER — ALLOPURINOL 100 MG PO TABS
100.0000 mg | ORAL_TABLET | ORAL | Status: DC
  Administered 2024-07-05: 100 mg via ORAL
  Filled 2024-07-04: qty 1

## 2024-07-04 MED ORDER — GELATIN ABSORBABLE 12-7 MM EX MISC
1.0000 | Freq: Once | CUTANEOUS | Status: AC
  Administered 2024-07-04: 1 via TOPICAL

## 2024-07-04 MED ORDER — ATORVASTATIN CALCIUM 80 MG PO TABS
80.0000 mg | ORAL_TABLET | Freq: Every day | ORAL | Status: DC
  Administered 2024-07-04 – 2024-07-05 (×2): 80 mg via ORAL
  Filled 2024-07-04 (×2): qty 1

## 2024-07-04 MED ORDER — AMLODIPINE BESYLATE 5 MG PO TABS
5.0000 mg | ORAL_TABLET | Freq: Every morning | ORAL | Status: DC
  Administered 2024-07-05: 5 mg via ORAL
  Filled 2024-07-04: qty 1

## 2024-07-04 MED ORDER — ACETAMINOPHEN 650 MG RE SUPP: 650.0000 mg | Freq: Four times a day (QID) | RECTAL | Status: DC | PRN

## 2024-07-04 MED ORDER — MIDAZOLAM HCL 2 MG/2ML IJ SOLN
INTRAMUSCULAR | Status: AC
  Filled 2024-07-04: qty 2

## 2024-07-04 MED ORDER — MORPHINE SULFATE (PF) 2 MG/ML IV SOLN: 2.0000 mg | INTRAVENOUS | Status: DC | PRN

## 2024-07-04 MED ORDER — LEVOTHYROXINE SODIUM 88 MCG PO TABS
88.0000 ug | ORAL_TABLET | Freq: Every day | ORAL | Status: DC
  Filled 2024-07-04: qty 1

## 2024-07-04 MED ORDER — ALBUTEROL SULFATE (2.5 MG/3ML) 0.083% IN NEBU: 3.0000 mL | INHALATION_SOLUTION | Freq: Four times a day (QID) | RESPIRATORY_TRACT | Status: DC | PRN

## 2024-07-04 MED ORDER — FENTANYL CITRATE (PF) 100 MCG/2ML IJ SOLN
INTRAMUSCULAR | Status: AC
  Filled 2024-07-04: qty 2

## 2024-07-04 NOTE — Procedures (Signed)
 Interventional Radiology Procedure:   Indications: Chronic kidney disease  Procedure: US  guided random renal biopsy  Findings: 2 core biopsies from left kidney lower pole.  Small post biopsy hematoma.  Pressure was held at site for 10 minutes and no active bleeding after 10 minutes.    Complications: None     EBL:   Plan:  Strict bedrest 4 hours   Brylei Pedley R. Philip, MD  Pager: [phone]

## 2024-07-04 NOTE — Progress Notes (Signed)
 Chief Complaint: Gross hematuria s/p renal bx  Referring Provider(s): Singh,Vikas   Supervising Physician: Philip Cornet  Patient Status: [facility] - In-pt  History of Present Illness: Hannah [name] is a 78 y.o. female with PMH significant for asthma, MGUS, DM, HTN,HLD, a fib (on Eliquis ), SLE, CKD, iron  deficiency anemia. She presented to IR 07/04/24 after having held her eliquis  since 10/5 for renal biopsy for work up of proteinuria at the request of Dr. Dennise.  During the procedure with Dr. Philip in IR today, a perinephric hematoma was immediately identified after the second core biopsy. A gel-foam slurry was applied intra-procedure. Pressure was applied to the puncture site for approximately 10 minutes under ultrasound observation. After 10 minutes, the hematoma appeared to be stable in size and there was no evidence for active bleeding based on color Doppler imaging.  While in recovery area, experienced dark hematuria.   Plan was made for patient to return to short stay for post-op observation with re-eval by IR provider prior to DC.  Allergies Reviewed:  Shellfish allergy  and Shellfish-derived products   Patient is Full Code  Past Medical History:  Diagnosis Date   Allergy  25 years ago.   Anemia    Arthritis    Asthma    Atrial fibrillation (HCC)    Cataract Had cataracts surgery 2015 & 2016   Chronic kidney disease    Diabetes mellitus without complication (HCC)    Gout 12/17/2014   patient reported   Heart murmur 4 years ago   Hyperlipidemia    Hypertension    Hypothyroidism    Systemic lupus erythematosus (HCC)    Vitiligo     Past Surgical History:  Procedure Laterality Date   BIOPSY  09/26/2023   Procedure: BIOPSY;  Surgeon: Aneita Gwendlyn DASEN, MD;  Location: [facility];  Service: Gastroenterology;;   CATARACT EXTRACTION Bilateral 2015   COLONOSCOPY WITH PROPOFOL  N/A 09/27/2023   Procedure: COLONOSCOPY WITH PROPOFOL ;  Surgeon: Kristie Lamprey, MD;  Location:  [facility];  Service: Gastroenterology;  Laterality: N/A;   DILATION AND CURETTAGE OF UTERUS     DOPPLER ECHOCARDIOGRAPHY  05/2018   Internist to review with pt; potential heart murmur 06/20/18   ESOPHAGOGASTRODUODENOSCOPY (EGD) WITH PROPOFOL  N/A 09/26/2023   Procedure: ESOPHAGOGASTRODUODENOSCOPY (EGD) WITH PROPOFOL ;  Surgeon: Aneita Gwendlyn DASEN, MD;  Location: [facility];  Service: Gastroenterology;  Laterality: N/A;   EYE SURGERY  Cataract Surgery, 2015 & 2016.   keratosis removal  2021   KNEE CLOSED REDUCTION Left 07/06/2022   Procedure: CLOSED MANIPULATION KNEE;  Surgeon: Melodi Lerner, MD;  Location: [facility];  Service: Orthopedics;  Laterality: Left;   KNEE CLOSED REDUCTION Right 09/13/2023   Procedure: CLOSED MANIPULATION KNEE;  Surgeon: Melodi Lerner, MD;  Location: [facility];  Service: Orthopedics;  Laterality: Right;   REPLACEMENT TOTAL KNEE Left    SKIN SURGERY  11/30/2018   left side of face   TOOTH EXTRACTION     TOTAL KNEE ARTHROPLASTY Left 02/02/2022   Procedure: TOTAL KNEE ARTHROPLASTY;  Surgeon: Melodi Lerner, MD;  Location: [facility];  Service: Orthopedics;  Laterality: Left;   TOTAL KNEE ARTHROPLASTY Right 07/12/2023   Procedure: RIGHT TOTAL KNEE ARTHROPLASTY;  Surgeon: Melodi Lerner, MD;  Location: [facility];  Service: Orthopedics;  Laterality: Right;      Medications: Prior to Admission medications   Medication Sig Start Date End Date Taking? Authorizing Provider  allopurinol  (ZYLOPRIM ) 100 MG tablet TAKE 1 TABLET BY MOUTH 3 TIMES A WEEK 02/07/24  Yes Jarold Medici, MD  amLODipine  (NORVASC ) 5 MG tablet TAKE 1 TABLET(5 MG) BY MOUTH AT BEDTIME 05/22/24  Yes Jarold Medici, MD  Ascorbic Acid (VITAMIN C PO) Take 500 mg by mouth daily with lunch.   Yes [provider]  atorvastatin  (LIPITOR) 80 MG tablet TAKE 1 TABLET BY MOUTH EVERY DAY 07/12/23  Yes Jarold Medici, MD  B Complex-C (B-COMPLEX WITH VITAMIN C) tablet Take 1 tablet by mouth daily with lunch.   Yes  [provider]  budesonide -formoterol  (SYMBICORT ) 160-4.5 MCG/ACT inhaler INHALE 2 PUFFS BY MOUTH TWICE DAILY IN THE MORNING AND IN THE EVENING 09/08/22  Yes Jarold Medici, MD  Calcium  Carb-Cholecalciferol 600-800 MG-UNIT TABS Take 1 tablet by mouth daily with lunch.   Yes [provider]  Cholecalciferol (VITAMIN D  PO) Take 2,000 Units by mouth daily with lunch.   Yes [provider]  dapagliflozin  propanediol (FARXIGA ) 10 MG TABS tablet Take 1 tablet (10 mg total) by mouth daily before breakfast. 04/17/24  Yes Jarold Medici, MD  hydroxychloroquine  (PLAQUENIL ) 200 MG tablet TAKE 1 TABLET BY MOUTH TWICE DAILY, MONDAY THROUGH FRIDAY ONLY 03/16/24  Yes Cheryl Waddell HERO, PA-C  irbesartan  (AVAPRO ) 300 MG tablet TAKE 1 TABLET(300 MG) BY MOUTH DAILY 01/04/24  Yes Jarold Medici, MD  metoprolol  succinate (TOPROL -XL) 25 MG 24 hr tablet Take 1 tablet (25 mg total) by mouth daily. Please call [phone] to schedule an appointment for future refills. Thank you. 04/07/24  Yes Nahser, Aleene PARAS, MD  Multiple Vitamin (MULTIVITAMIN WITH MINERALS) TABS tablet Take 1 tablet by mouth daily with lunch. Women's Multivitamin   Yes [provider]  pantoprazole  (PROTONIX ) 40 MG tablet Take 40 mg by mouth every morning.   Yes [provider]  Polyethyl Glycol-Propyl Glycol (SYSTANE) 0.4-0.3 % SOLN Place 1-2 drops into both eyes 3 (three) times daily as needed (dry/irritated eyes.).   Yes [provider]  SYNTHROID  88 MCG tablet TAKE 1 TABLET BY MOUTH EVERY DAY MONDAY TO SATURDAY AND OFF ON SUNDAYS Patient taking differently: Take 88 mcg by mouth daily before breakfast. TAKE 1 88 MCG TABLET BY MOUTH ON SATURDAY AND SUNDAY AND  TAKE 1 75 MCG TABLET BY MOUTH EVERY DAY MONDAY THROUGH FRIDAY (samples provided by PCP) 07/29/22  Yes Jarold Medici, MD  Accu-Chek Softclix Lancets lancets Use to check blood sugars daily E11.69 11/04/20   Jarold Medici, MD  albuterol  (PROVENTIL   HFA;VENTOLIN  HFA) 108 (90 Base) MCG/ACT inhaler Inhale 2 puffs into the lungs every 6 (six) hours as needed for wheezing or shortness of breath. 09/02/18   Georgina Speaks, FNP  Alcohol  Swabs (ALCOHOL  PADS) 70 % PADS Use as directed to check blood sugars 1 time per day dx: e11.22 08/03/22   Jarold Medici, MD  apixaban  (ELIQUIS ) 5 MG TABS tablet Take 1 tablet (5 mg total) by mouth 2 (two) times daily. 01/11/24 01/10/25  Nahser, Aleene PARAS, MD  denosumab  (PROLIA ) 60 MG/ML SOSY injection Inject 60 mg into the skin every 6 (six) months. Courier to rheum: [address], [address], [place] [address]. Appt on 12/07/2022 11/26/22 06/28/24  Dolphus Reiter, MD  denosumab  (PROLIA ) 60 MG/ML SOSY injection Inject 60 mg into the skin every 6 (six) months.    [provider]  glucose blood (ACCU-CHEK GUIDE) test strip USE TO CHECK BLOOD SUGAR DAILY 02/23/23   Jarold Medici, MD     Family History  Problem Relation Age of Onset   Heart disease Father    Diabetes Father  Hypertension Mother    Obesity Mother    Varicose Veins Mother    Hypertension Sister    Hypertension Sister    Leukemia Sister    Cancer Sister    Obesity Sister     Social History   Socioeconomic History   Marital status: Single    Spouse name: Not on file   Number of children: 0   Years of education: Not on file   Highest education level: Bachelor's degree (e.g., BA, AB, BS)  Occupational History   Occupation: retired  Tobacco Use   Smoking status: Never    Passive exposure: Never   Smokeless tobacco: Never  Vaping Use   Vaping status: Never Used  Substance and Sexual Activity   Alcohol  use: Not Currently    Comment: Maybe a glass of wine every 3 months.   Drug use: No   Sexual activity: Not Currently    Birth control/protection: None    Comment: 1ST intercourse- 19 , partners- 6,   Other Topics Concern   Not on file  Social History Narrative   Not on file   Social Drivers of Health   Financial Resource  Strain: Low Risk  (03/02/2024)   Overall Financial Resource Strain (CARDIA)    Difficulty of Paying Living Expenses: Not hard at all  Food Insecurity: No Food Insecurity (06/30/2024)   Hunger Vital Sign    Worried About Running Out of Food in the Last Year: Never true    Ran Out of Food in the Last Year: Never true  Transportation Needs: No Transportation Needs (06/30/2024)   PRAPARE - Administrator, Civil Service (Medical): No    Lack of Transportation (Non-Medical): No  Physical Activity: Sufficiently Active (02/10/2024)   Exercise Vital Sign    Days of Exercise per Week: 3 days    Minutes of Exercise per Session: 50 min  Stress: No Stress Concern Present (02/10/2024)   [employer] of Occupational Health - Occupational Stress Questionnaire    Feeling of Stress : Only a little  Social Connections: Moderately Integrated (05/26/2024)   Social Connection and Isolation Panel    Frequency of Communication with Friends and Family: More than three times a week    Frequency of Social Gatherings with Friends and Family: More than three times a week    Attends Religious Services: 1 to 4 times per year    Active Member of [employer] or Organizations: Yes    Attends Engineer, structural: More than 4 times per year    Marital Status: Never married     Review of Systems:  Denies CP, palpitations, SOB, pain at biopsy site or anywhere else. She is without complaint apart from hematuria.  A 12 point ROS discussed and pertinent positives are indicated in the HPI above.  All other systems are negative.   Vital Signs: BP 110/75   Pulse (!) 50   Temp 97.7 F (36.5 C) (Oral)   Resp 14   Ht 4' 10.5 (1.486 m)   Wt 160 lb (72.6 kg)   SpO2 100%   BMI 32.87 kg/m      Physical Exam Cardiovascular:     Rate and Rhythm: Normal rate and regular rhythm.  Pulmonary:     Effort: Pulmonary effort is normal.  Abdominal:     Palpations: Abdomen is soft.     Tenderness: There is no  abdominal tenderness.  Skin:    General: Skin is warm and dry.  Comments: Biopsy site appropriately tender without obvious hematoma  Neurological:     Mental Status: She is alert and oriented to person, place, and time.  Psychiatric:        Mood and Affect: Mood normal.        Behavior: Behavior normal.        Thought Content: Thought content normal.        Judgment: Judgment normal.     Imaging: US  BIOPSY (KIDNEY) Result Date: 07/04/2024 INDICATION: Chronic kidney disease, stage III.  Request for renal biopsy. EXAM: ULTRASOUND-GUIDED RANDOM RENAL BIOPSY MEDICATIONS: Moderate sedation ANESTHESIA/SEDATION: Moderate (conscious) sedation was employed during this procedure. A total of Versed  2 mg and Fentanyl  100 mcg was administered intravenously by the radiology nurse. Total intra-service moderate Sedation Time: 31 minutes. The patient's level of consciousness and vital signs were monitored continuously by radiology nursing throughout the procedure under my direct supervision. FLUOROSCOPY TIME:  None COMPLICATIONS: SIR LEVEL B - Normal therapy, includes overnight admission for observation. PROCEDURE: Informed written consent was obtained from the patient after a thorough discussion of the procedural risks, benefits and alternatives. All questions were addressed. A timeout was performed prior to the initiation of the procedure. Patient was placed prone. Both kidneys were evaluated with ultrasound. Left kidney was selected for biopsy. Left flank was prepped and draped in sterile fashion. Maximal barrier sterile technique was utilized including caps, mask, sterile gowns, sterile gloves, sterile drape, hand hygiene and skin antiseptic. Skin was anesthetized using 1% lidocaine . Small incision was made. Using ultrasound guidance, a 17 gauge coaxial needle was directed to the lower pole cortex. Two core biopsies were obtained from the lower pole cortex with an 18 gauge core device. Specimens placed in  saline. Gel-Foam slurry was injected as the 17 gauge coaxial needle was retracted. Pressure was applied to the puncture site for approximately 10 minutes while observing with ultrasound. Bandage placed over the puncture site. FINDINGS: 2 adequate core biopsy specimens were obtained. Perinephric hematoma was immediately identified after the second core biopsy. Pressure was applied to the puncture site for approximately 10 minutes under ultrasound observation. After 10 minutes, the hematoma appeared to be stable in size and there was no evidence for active bleeding based on color Doppler imaging. Patient was asymptomatic after the biopsy and stable hemoglobin approximately 2.5 hours after the biopsy. However, the patient had dark hematuria throughout the recovery period. As a result, the patient will be admitted for observation. IMPRESSION: 1. Ultrasound-guided core biopsy from the left kidney lower pole. 2. Perinephric hematoma identified immediately following the biopsy. Patient had asymptomatic hematuria after the procedure. Patient will be admitted overnight for observation. Electronically Signed   By: Juliene Balder M.D.   On: 07/04/2024 15:48    Labs:  CBC: Recent Labs    05/11/24 1143 06/05/24 0737 07/04/24 0725 07/04/24 1204  WBC 5.2 5.5 4.3 4.7  HGB 8.3* 8.7* 8.4* 8.3*  HCT 26.8* 27.2* 27.2* 27.1*  PLT 201 221 142* 182    COAGS: Recent Labs    09/23/23 1800 07/04/24 0725  INR 1.9* 1.0    BMP: Recent Labs    09/27/23 0432 09/28/23 0434 10/05/23 1244 11/25/23 0841 12/01/23 1552 03/15/24 1636 05/11/24 1143 06/05/24 0737  NA 138 133*   < > 142 143 140 140 142  K 3.8 3.7   < > 4.3 4.7 4.3 4.6 4.4  CL 109 107   < > 109 107 105 106 109  CO2 20* 19*   < >  28 29 17* 23 27  GLUCOSE 85 80   < > 109* 82 91 73 108*  BUN 12 12   < > 21 17 23 23 21   CALCIUM  7.7* 7.4*   < > 9.3 9.8 9.4 8.7 8.9  CREATININE 0.96 0.89   < > 1.20* 1.24* 1.18* 1.33* 1.31*  GFRNONAA >60 >60  --  46*  --    --   --  42*   < > = values in this interval not displayed.    LIVER FUNCTION TESTS: Recent Labs    10/05/23 1244 11/25/23 0841 12/01/23 1552 03/15/24 1636 05/11/24 1143 06/05/24 0737  BILITOT 0.6 0.4 0.5 0.3 0.4 0.3  AST 20 21 18 21 23 19   ALT 9 12 11 11 14 11   ALKPHOS 78 64  --  71  --  50  PROT 6.2 6.8 6.5 6.5 6.2 6.5  ALBUMIN 4.0 4.0  --  4.1  --  4.0    TUMOR MARKERS: No results for input(s): AFPTM, CEA, CA199, CHROMGRNA in the last 8760 hours.  Assessment and Plan:  VS remain stable, Hgb 8.3 from 8.4 at approx 3 hr post (additional H&H orders for 4p, 12a, and 5a). Patient denies pain or any complaint. However, at 3p upon reassessment, continued dark hematuria not significantly improved from immediately post procedure.   Discussed with patient and family member who would be staying with her tonight (her sister, also geriatric). Agreement that overnight observation for additional bleeding would be the safest option for this patient.   Hospitalist contacted; coordinated with Dr. Tobie for patient to be admitted. Short stay RN aware.   Thank you for allowing our service to participate in Hannah [name] 's care.    Electronically Signed: Laymon Coast, NP   07/04/2024, 4:21 PM      (A copy of this note was sent to the referring provider and the time of visit.)

## 2024-07-04 NOTE — H&P (Addendum)
 History and Physical    Patient: Hannah [name] FMW:[id number] DOB: 1945-10-02 DOA: 07/04/2024 DOS: the patient was seen and examined on 07/04/2024 . PCP: Jarold Medici, MD  Patient coming from: Short stay after left renal biopsy Chief complaint: Gross hematuria Left renal hematoma  HPI:  Hannah [name] is a 78 y.o. female with past medical history  of  hypertension, hyperlipidemia, paroxysmal atrial fibrillation during COVID, GI bleed, type 2 diabetes, hypothyroidism, MGUS, obesity admitted for observation for gross hematuria that patient developed after her renal biopsy    ED Course:  Vital signs in the ED were notable for the following:  Vitals:   07/04/24 1720 07/04/24 1743  BP:  (!) 126/55  Pulse:  (!) 58  Resp: 16   TempSrc: Oral   CBC show today that her hemoglobin was stable initially 8.4 repeat at 8.3 platelets at 142 and repeat at 182 and normal white count. CMP ordered along with a.m. labs and repeat CBC tomorrow morning along with renal ultrasound.  >>While in the ED patient received the following: N/A.  Review of Systems  All other systems reviewed and are negative.  Past Medical History:  Diagnosis Date   Allergy  25 years ago.   Anemia    Arthritis    Asthma    Atrial fibrillation (HCC)    Cataract Had cataracts surgery 2015 & 2016   Chronic kidney disease    Diabetes mellitus without complication (HCC)    Gout 12/17/2014   patient reported   Heart murmur 4 years ago   Hyperlipidemia    Hypertension    Hypothyroidism    Systemic lupus erythematosus (HCC)    Vitiligo    Past Surgical History:  Procedure Laterality Date   BIOPSY  09/26/2023   Procedure: BIOPSY;  Surgeon: Aneita Gwendlyn DASEN, MD;  Location: [facility];  Service: Gastroenterology;;   CATARACT EXTRACTION Bilateral 2015   COLONOSCOPY WITH PROPOFOL  N/A 09/27/2023   Procedure: COLONOSCOPY WITH PROPOFOL ;  Surgeon: Kristie Lamprey, MD;  Location: [facility];  Service:  Gastroenterology;  Laterality: N/A;   DILATION AND CURETTAGE OF UTERUS     DOPPLER ECHOCARDIOGRAPHY  05/2018   Internist to review with pt; potential heart murmur 06/20/18   ESOPHAGOGASTRODUODENOSCOPY (EGD) WITH PROPOFOL  N/A 09/26/2023   Procedure: ESOPHAGOGASTRODUODENOSCOPY (EGD) WITH PROPOFOL ;  Surgeon: Aneita Gwendlyn DASEN, MD;  Location: [facility];  Service: Gastroenterology;  Laterality: N/A;   EYE SURGERY  Cataract Surgery, 2015 & 2016.   keratosis removal  2021   KNEE CLOSED REDUCTION Left 07/06/2022   Procedure: CLOSED MANIPULATION KNEE;  Surgeon: Melodi Lerner, MD;  Location: [facility];  Service: Orthopedics;  Laterality: Left;   KNEE CLOSED REDUCTION Right 09/13/2023   Procedure: CLOSED MANIPULATION KNEE;  Surgeon: Melodi Lerner, MD;  Location: [facility];  Service: Orthopedics;  Laterality: Right;   REPLACEMENT TOTAL KNEE Left    SKIN SURGERY  11/30/2018   left side of face   TOOTH EXTRACTION     TOTAL KNEE ARTHROPLASTY Left 02/02/2022   Procedure: TOTAL KNEE ARTHROPLASTY;  Surgeon: Melodi Lerner, MD;  Location: [facility];  Service: Orthopedics;  Laterality: Left;   TOTAL KNEE ARTHROPLASTY Right 07/12/2023   Procedure: RIGHT TOTAL KNEE ARTHROPLASTY;  Surgeon: Melodi Lerner, MD;  Location: [facility];  Service: Orthopedics;  Laterality: Right;    reports that she has never smoked. She has never been exposed to tobacco smoke. She has never used smokeless tobacco. She reports that she does not currently use alcohol . She reports that  she does not use drugs. Allergies  Allergen Reactions   Shellfish Allergy  Anaphylaxis, Hives and Other (See Comments)   Shellfish-Derived Products Anaphylaxis and Other (See Comments)   Family History  Problem Relation Age of Onset   Heart disease Father    Diabetes Father    Hypertension Mother    Obesity Mother    Varicose Veins Mother    Hypertension Sister    Hypertension Sister    Leukemia Sister    Cancer Sister    Obesity Sister    Prior to  Admission medications   Medication Sig Start Date End Date Taking? Authorizing Provider  Accu-Chek Softclix Lancets lancets Use to check blood sugars daily E11.69 11/04/20   Jarold Medici, MD  albuterol  (PROVENTIL  HFA;VENTOLIN  HFA) 108 (90 Base) MCG/ACT inhaler Inhale 2 puffs into the lungs every 6 (six) hours as needed for wheezing or shortness of breath. 09/02/18   Georgina Speaks, FNP  Alcohol  Swabs (ALCOHOL  PADS) 70 % PADS Use as directed to check blood sugars 1 time per day dx: e11.22 08/03/22   Jarold Medici, MD  allopurinol  (ZYLOPRIM ) 100 MG tablet TAKE 1 TABLET BY MOUTH 3 TIMES A WEEK 02/07/24   Jarold Medici, MD  amLODipine  (NORVASC ) 5 MG tablet TAKE 1 TABLET(5 MG) BY MOUTH AT BEDTIME 05/22/24   Jarold Medici, MD  apixaban  (ELIQUIS ) 5 MG TABS tablet Take 1 tablet (5 mg total) by mouth 2 (two) times daily. 01/11/24 01/10/25  Nahser, Aleene PARAS, MD  Ascorbic Acid (VITAMIN C PO) Take 500 mg by mouth daily with lunch.    [provider]  atorvastatin  (LIPITOR) 80 MG tablet TAKE 1 TABLET BY MOUTH EVERY DAY 07/12/23   Jarold Medici, MD  B Complex-C (B-COMPLEX WITH VITAMIN C) tablet Take 1 tablet by mouth daily with lunch.    [provider]  budesonide -formoterol  (SYMBICORT ) 160-4.5 MCG/ACT inhaler INHALE 2 PUFFS BY MOUTH TWICE DAILY IN THE MORNING AND IN THE EVENING 09/08/22   Jarold Medici, MD  Calcium  Carb-Cholecalciferol 600-800 MG-UNIT TABS Take 1 tablet by mouth daily with lunch.    [provider]  Cholecalciferol (VITAMIN D  PO) Take 2,000 Units by mouth daily with lunch.    [provider]  dapagliflozin  propanediol (FARXIGA ) 10 MG TABS tablet Take 1 tablet (10 mg total) by mouth daily before breakfast. 04/17/24   Jarold Medici, MD  denosumab  (PROLIA ) 60 MG/ML SOSY injection Inject 60 mg into the skin every 6 (six) months. Courier to rheum: [address], [address], [place] [employer] [place] [address]. Appt on 12/07/2022 11/26/22 06/28/24  Dolphus Reiter, MD  denosumab   (PROLIA ) 60 MG/ML SOSY injection Inject 60 mg into the skin every 6 (six) months.    [provider]  glucose blood (ACCU-CHEK GUIDE) test strip USE TO CHECK BLOOD SUGAR DAILY 02/23/23   Jarold Medici, MD  hydroxychloroquine  (PLAQUENIL ) 200 MG tablet TAKE 1 TABLET BY MOUTH TWICE DAILY, MONDAY THROUGH FRIDAY ONLY 03/16/24   Cheryl Waddell HERO, PA-C  irbesartan  (AVAPRO ) 300 MG tablet TAKE 1 TABLET(300 MG) BY MOUTH DAILY 01/04/24   Jarold Medici, MD  metoprolol  succinate (TOPROL -XL) 25 MG 24 hr tablet Take 1 tablet (25 mg total) by mouth daily. Please call [phone] to schedule an appointment for future refills. Thank you. 04/07/24   Nahser, Aleene PARAS, MD  Multiple Vitamin (MULTIVITAMIN WITH MINERALS) TABS tablet Take 1 tablet by mouth daily with lunch. Women's Multivitamin    [provider]  pantoprazole  (PROTONIX ) 40 MG tablet Take 40 mg by mouth  every morning.    [provider]  Polyethyl Glycol-Propyl Glycol (SYSTANE) 0.4-0.3 % SOLN Place 1-2 drops into both eyes 3 (three) times daily as needed (dry/irritated eyes.).    [provider]  SYNTHROID  88 MCG tablet TAKE 1 TABLET BY MOUTH EVERY DAY MONDAY TO SATURDAY AND OFF ON SUNDAYS Patient taking differently: Take 88 mcg by mouth daily before breakfast. TAKE 1 88 MCG TABLET BY MOUTH ON SATURDAY AND SUNDAY AND  TAKE 1 75 MCG TABLET BY MOUTH EVERY DAY MONDAY THROUGH FRIDAY (samples provided by PCP) 07/29/22   Jarold Medici, MD                                                                                 Vitals:   07/04/24 1720 07/04/24 1743  BP:  (!) 126/55  Pulse:  (!) 58  Resp: 16   TempSrc: Oral    Physical Exam Vitals reviewed.  Constitutional:      General: She is not in acute distress.    Appearance: She is not ill-appearing.  HENT:     Head: Normocephalic.  Eyes:     Extraocular Movements: Extraocular movements intact.  Cardiovascular:     Rate and Rhythm: Normal rate and regular rhythm.     Heart  sounds: Normal heart sounds.  Pulmonary:     Breath sounds: Normal breath sounds.  Abdominal:     General: There is no distension.     Palpations: Abdomen is soft.     Tenderness: There is no abdominal tenderness.  Neurological:     General: No focal deficit present.     Mental Status: She is alert and oriented to person, place, and time.     Labs on Admission: I have personally reviewed following labs and imaging studies CBC: Recent Labs  Lab 07/04/24 0725 07/04/24 1204  WBC 4.3 4.7  HGB 8.4* 8.3*  HCT 27.2* 27.1*  MCV 103.0* 101.9*  PLT 142* 182   Basic Metabolic Panel: No results for input(s): NA, K, CL, CO2, GLUCOSE, BUN, CREATININE, CALCIUM , MG, PHOS in the last 168 hours. GFR: CrCl cannot be calculated (Patient's most recent lab result is older than the maximum 21 days allowed.). Liver Function Tests: No results for input(s): AST, ALT, ALKPHOS, BILITOT, PROT, ALBUMIN in the last 168 hours. No results for input(s): LIPASE, AMYLASE in the last 168 hours. No results for input(s): AMMONIA in the last 168 hours. Recent Labs    09/26/23 0445 09/27/23 0432 09/28/23 0434 10/05/23 1244 11/08/23 1236 11/25/23 0841 12/01/23 1552 03/15/24 1636 05/11/24 1143 06/05/24 0737  BUN 15 12 12 13 17 21 17 23 23 21   CREATININE 1.06* 0.96 0.89 1.20* 1.19* 1.20* 1.24* 1.18* 1.33* 1.31*    Cardiac Enzymes: No results for input(s): CKTOTAL, CKMB, CKMBINDEX, TROPONINI in the last 168 hours. BNP (last 3 results) No results for input(s): PROBNP in the last 8760 hours. HbA1C: No results for input(s): HGBA1C in the last 72 hours. CBG: Recent Labs  Lab 07/04/24 0717  GLUCAP 76   Lipid Profile: No results for input(s): CHOL, HDL, LDLCALC, TRIG, CHOLHDL, LDLDIRECT in the last 72 hours. Thyroid  Function Tests: No results for input(s): TSH, T4TOTAL,  FREET4, T3FREE, THYROIDAB in the last 72 hours. Anemia  Panel: No results for input(s): VITAMINB12, FOLATE, FERRITIN, TIBC, IRON , RETICCTPCT in the last 72 hours. Urine analysis:    Component Value Date/Time   COLORURINE YELLOW 09/23/2023 1606   APPEARANCEUR HAZY (A) 09/23/2023 1606   LABSPEC 1.014 09/23/2023 1606   PHURINE 5.0 09/23/2023 1606   GLUCOSEU >=500 (A) 09/23/2023 1606   HGBUR NEGATIVE 09/23/2023 1606   BILIRUBINUR NEGATIVE 09/23/2023 1606   BILIRUBINUR Negative 01/12/2023 1629   KETONESUR 5 (A) 09/23/2023 1606   PROTEINUR 30 (A) 09/23/2023 1606   UROBILINOGEN 0.2 01/12/2023 1629   NITRITE NEGATIVE 09/23/2023 1606   LEUKOCYTESUR SMALL (A) 09/23/2023 1606   Radiological Exams on Admission: US  BIOPSY (KIDNEY) Result Date: 07/04/2024 INDICATION: Chronic kidney disease, stage III.  Request for renal biopsy. EXAM: ULTRASOUND-GUIDED RANDOM RENAL BIOPSY MEDICATIONS: Moderate sedation ANESTHESIA/SEDATION: Moderate (conscious) sedation was employed during this procedure. A total of Versed  2 mg and Fentanyl  100 mcg was administered intravenously by the radiology nurse. Total intra-service moderate Sedation Time: 31 minutes. The patient's level of consciousness and vital signs were monitored continuously by radiology nursing throughout the procedure under my direct supervision. FLUOROSCOPY TIME:  None COMPLICATIONS: SIR LEVEL B - Normal therapy, includes overnight admission for observation. PROCEDURE: Informed written consent was obtained from the patient after a thorough discussion of the procedural risks, benefits and alternatives. All questions were addressed. A timeout was performed prior to the initiation of the procedure. Patient was placed prone. Both kidneys were evaluated with ultrasound. Left kidney was selected for biopsy. Left flank was prepped and draped in sterile fashion. Maximal barrier sterile technique was utilized including caps, mask, sterile gowns, sterile gloves, sterile drape, hand hygiene and skin antiseptic. Skin  was anesthetized using 1% lidocaine . Small incision was made. Using ultrasound guidance, a 17 gauge coaxial needle was directed to the lower pole cortex. Two core biopsies were obtained from the lower pole cortex with an 18 gauge core device. Specimens placed in saline. Gel-Foam slurry was injected as the 17 gauge coaxial needle was retracted. Pressure was applied to the puncture site for approximately 10 minutes while observing with ultrasound. Bandage placed over the puncture site. FINDINGS: 2 adequate core biopsy specimens were obtained. Perinephric hematoma was immediately identified after the second core biopsy. Pressure was applied to the puncture site for approximately 10 minutes under ultrasound observation. After 10 minutes, the hematoma appeared to be stable in size and there was no evidence for active bleeding based on color Doppler imaging. Patient was asymptomatic after the biopsy and stable hemoglobin approximately 2.5 hours after the biopsy. However, the patient had dark hematuria throughout the recovery period. As a result, the patient will be admitted for observation. IMPRESSION: 1. Ultrasound-guided core biopsy from the left kidney lower pole. 2. Perinephric hematoma identified immediately following the biopsy. Patient had asymptomatic hematuria after the procedure. Patient will be admitted overnight for observation. Electronically Signed   By: Juliene Balder M.D.   On: 07/04/2024 15:48   Data Reviewed: Relevant notes from primary care and specialist visits, past discharge summaries as available in EHR, including Care Everywhere . Prior diagnostic testing as pertinent to current admission diagnoses, Updated medications and problem lists for reconciliation .ED course, including vitals, labs, imaging, treatment and response to treatment,Triage notes, nursing and pharmacy notes and ED provider's notes.Notable results as noted in HPI.Discussed case with EDMD/ ED APP/ or Specialty MD on call and as  needed.  Assessment & Plan  >> Gross  hematuria: Secondary to recent elective renal biopsy and small perinephric hematoma.  Patient has asymptomatic hematuria. patient is on anticoagulation which was held according to protocol.  Will follow patient's hematuria H&H renal ultrasound per a.m. team and if no increase in size and resolution of hematuria and urine is clearing patient is clinically stable anticipate discharge tomorrow morning.  If patient declines or has any other untoward side effects or symptoms we will reach out to IR again for embolization.  >> Paroxysmal atrial fibrillation: Rate controlled/ will monitor and resume AC prior to discharge if appropriate after eval by am team.    >> Essential hypertension: Vitals:   07/04/24 1743  BP: (!) 126/55  Hold bp meds.   >>DM II: Glycemic protocol and carb consistent diet.   >> CKD stage IIIa: Lab Results  Component Value Date   CREATININE 1.31 (H) 06/05/2024   CREATININE 1.33 (H) 05/11/2024   CREATININE 1.18 (H) 03/15/2024  Will monitor avoid contrast and renally dose needed medications.     DVT prophylaxis:  scd Consults:  None.   Advance Care Planning:    Code Status: Full Code   Family Communication:  None.  Disposition Plan:  Home.  Severity of Illness: The appropriate patient status for this patient is OBSERVATION. Observation status is judged to be reasonable and necessary in order to provide the required intensity of service to ensure the patient's safety. The patient's presenting symptoms, physical exam findings, and initial radiographic and laboratory data in the context of their medical condition is felt to place them at decreased risk for further clinical deterioration. Furthermore, it is anticipated that the patient will be medically stable for discharge from the hospital within 2 midnights of admission.   Unresulted Labs (From admission, onward)     Start     Ordered   07/05/24 0500  Comprehensive  metabolic panel  Tomorrow morning,   R        07/04/24 1713   07/05/24 0500  CBC  Tomorrow morning,   R        07/04/24 1713            Meds ordered this encounter  Medications   sodium chloride  flush (NS) 0.9 % injection 3 mL   0.9 %  sodium chloride  infusion   OR Linked Order Group    acetaminophen  (TYLENOL ) tablet 650 mg    acetaminophen  (TYLENOL ) suppository 650 mg   morphine  (PF) 2 MG/ML injection 2 mg   insulin  aspart (novoLOG ) injection 0-9 Units    Correction coverage::   Sensitive (thin, NPO, renal)    CBG < 70::   Implement Hypoglycemia Standing Orders and refer to Hypoglycemia Standing Orders sidebar report    CBG 70 - 120::   0 units    CBG 121 - 150::   1 unit    CBG 151 - 200::   2 units    CBG 201 - 250::   3 units    CBG 251 - 300::   5 units    CBG 301 - 350::   7 units    CBG 351 - 400:   9 units    CBG > 400:   call MD and obtain STAT lab verification   albuterol  (VENTOLIN  HFA) 108 (90 Base) MCG/ACT inhaler 2 puff   allopurinol  (ZYLOPRIM ) tablet 100 mg   amLODipine  (NORVASC ) tablet 5 mg   atorvastatin  (LIPITOR) tablet 80 mg   fluticasone  furoate-vilanterol (BREO ELLIPTA ) 200-25 MCG/ACT 1 puff  hydroxychloroquine  (PLAQUENIL ) tablet 200 mg   metoprolol  succinate (TOPROL -XL) 24 hr tablet 25 mg   levothyroxine  (SYNTHROID ) tablet 88 mcg     Orders Placed This Encounter  Procedures   US  RENAL   Comprehensive metabolic panel   CBC   Diet Carb Modified Fluid consistency: Thin; Room service appropriate? Yes   Maintain IV access   Vital signs   Notify physician (specify)   Refer to Sidebar Report Mobility Protocol for Adult Inpatient   Initiate Adult Central Line Maintenance and Catheter Protocol for patients with central line (CVC, PICC, Port, Hemodialysis, Trialysis)   Daily weights   Intake and Output   Initiate CHG Protocol   Do not place and if present remove PureWick   Initiate Oral Care Protocol   Initiate Carrier Fluid Protocol   RN may order  General Admission PRN Orders utilizing General Admission PRN medications (through manage orders) for the following patient needs: allergy  symptoms (Claritin ), cold sores (Carmex), cough (Robitussin DM), eye irritation (Liquifilm Tears), hemorrhoids (Tucks), indigestion (Maalox), minor skin irritation (Hydrocortisone Cream), muscle pain Lucienne Gay), nose irritation (saline nasal spray) and sore throat (Chloraseptic spray).   Cardiac Monitoring Continuous x 48 hours Indications for use: Other; Other indications for use: gross hematuria.   Bed rest   Apply Diabetes Mellitus Care Plan   STAT CBG when hypoglycemia is suspected. If treated, recheck every 15 minutes after each treatment until CBG >/= 70 mg/dl   Refer to Hypoglycemia Protocol Sidebar Report for treatment of CBG < 70 mg/dl   No HS correction Insulin    Place and maintain sequential compression device   Full code   Pulse oximetry check with vital signs   Oxygen  therapy Mode or (Route): Nasal cannula; Liters Per Minute: 2; Keep O2 saturation between: greater than 92 %   EKG 12-Lead   Place in observation (patient's expected length of stay will be less than 2 midnights)    Author: Mario LULLA Blanch, MD 12 pm- 8 pm. [facility]. 07/04/2024 7:08 PM Please note for any communication after hours contact [facility] Assigned provider on call on Amion.

## 2024-07-04 NOTE — Sedation Documentation (Signed)
 Per MD Henn: patient to remain prone x1 hour starting at 937, then can flip to supine, bedrest for additional 3 hours supine.

## 2024-07-05 ENCOUNTER — Observation Stay (HOSPITAL_COMMUNITY)

## 2024-07-05 ENCOUNTER — Ambulatory Visit: Payer: Medicare HMO

## 2024-07-05 ENCOUNTER — Telehealth: Payer: Self-pay

## 2024-07-05 DIAGNOSIS — E1122 Type 2 diabetes mellitus with diabetic chronic kidney disease: Secondary | ICD-10-CM | POA: Diagnosis not present

## 2024-07-05 DIAGNOSIS — I129 Hypertensive chronic kidney disease with stage 1 through stage 4 chronic kidney disease, or unspecified chronic kidney disease: Secondary | ICD-10-CM

## 2024-07-05 DIAGNOSIS — I480 Paroxysmal atrial fibrillation: Secondary | ICD-10-CM

## 2024-07-05 DIAGNOSIS — E039 Hypothyroidism, unspecified: Secondary | ICD-10-CM

## 2024-07-05 DIAGNOSIS — Z87898 Personal history of other specified conditions: Secondary | ICD-10-CM

## 2024-07-05 DIAGNOSIS — N1831 Chronic kidney disease, stage 3a: Secondary | ICD-10-CM

## 2024-07-05 DIAGNOSIS — D631 Anemia in chronic kidney disease: Secondary | ICD-10-CM | POA: Diagnosis not present

## 2024-07-05 DIAGNOSIS — N281 Cyst of kidney, acquired: Secondary | ICD-10-CM | POA: Diagnosis not present

## 2024-07-05 DIAGNOSIS — N183 Chronic kidney disease, stage 3 unspecified: Secondary | ICD-10-CM | POA: Diagnosis not present

## 2024-07-05 DIAGNOSIS — M069 Rheumatoid arthritis, unspecified: Secondary | ICD-10-CM

## 2024-07-05 DIAGNOSIS — M329 Systemic lupus erythematosus, unspecified: Secondary | ICD-10-CM

## 2024-07-05 LAB — GLUCOSE, CAPILLARY
Glucose-Capillary: 86 mg/dL (ref 70–99)
Glucose-Capillary: 77 mg/dL (ref 70–99)

## 2024-07-05 LAB — COMPREHENSIVE METABOLIC PANEL WITH GFR
ALT: 14 U/L (ref 0–44)
AST: 24 U/L (ref 15–41)
Albumin: 2.8 g/dL — ABNORMAL LOW (ref 3.5–5.0)
Alkaline Phosphatase: 41 U/L (ref 38–126)
Anion gap: 7 (ref 5–15)
BUN: 20 mg/dL (ref 8–23)
CO2: 23 mmol/L (ref 22–32)
Calcium: 8.7 mg/dL — ABNORMAL LOW (ref 8.9–10.3)
Chloride: 109 mmol/L (ref 98–111)
Creatinine, Ser: 1.41 mg/dL — ABNORMAL HIGH (ref 0.44–1.00)
GFR, Estimated: 38 mL/min — ABNORMAL LOW (ref >60)
Glucose, Bld: 85 mg/dL (ref 70–99)
Potassium: 3.9 mmol/L (ref 3.5–5.1)
Sodium: 139 mmol/L (ref 135–145)
Total Bilirubin: 0.2 mg/dL (ref 0.0–1.2)
Total Protein: 5 g/dL — ABNORMAL LOW (ref 6.5–8.1)

## 2024-07-05 LAB — CBC
HCT: 23.7 % — ABNORMAL LOW (ref 36.0–46.0)
Hemoglobin: 7.3 g/dL — ABNORMAL LOW (ref 12.0–15.0)
MCH: 31.2 pg (ref 26.0–34.0)
MCHC: 30.8 g/dL (ref 30.0–36.0)
MCV: 101.3 fL — ABNORMAL HIGH (ref 80.0–100.0)
Platelets: 176 K/uL (ref 150–400)
RBC: 2.34 MIL/uL — ABNORMAL LOW (ref 3.87–5.11)
RDW: 16.5 % — ABNORMAL HIGH (ref 11.5–15.5)
WBC: 5.9 K/uL (ref 4.0–10.5)
nRBC: 0 % (ref 0.0–0.2)

## 2024-07-05 MED ORDER — METOPROLOL TARTRATE 5 MG/5ML IV SOLN: 5.0000 mg | INTRAVENOUS | Status: DC | PRN

## 2024-07-05 MED ORDER — LEVOTHYROXINE SODIUM 75 MCG PO TABS: 75.0000 ug | ORAL_TABLET | ORAL | Status: DC

## 2024-07-05 MED ORDER — ONDANSETRON HCL 4 MG/2ML IJ SOLN: 4.0000 mg | Freq: Four times a day (QID) | INTRAMUSCULAR | Status: DC | PRN

## 2024-07-05 MED ORDER — LEVOTHYROXINE SODIUM 88 MCG PO TABS: 88.0000 ug | ORAL_TABLET | ORAL | Status: DC

## 2024-07-05 MED ORDER — IPRATROPIUM-ALBUTEROL 0.5-2.5 (3) MG/3ML IN SOLN: 3.0000 mL | RESPIRATORY_TRACT | Status: DC | PRN

## 2024-07-05 MED ORDER — HYDRALAZINE HCL 20 MG/ML IJ SOLN: 10.0000 mg | INTRAMUSCULAR | Status: DC | PRN

## 2024-07-05 NOTE — Progress Notes (Signed)
 Discharge  Pt verbally understands POC.  Additional education explained and included in the chart.  NO PIV OR TELE at discharge.  Family at bedside.   Volunteer called for Main [facility].

## 2024-07-05 NOTE — Hospital Course (Addendum)
 Brief Narrative:   78 year old with past medical history of systemic lupus, rheumatoid arthritis, HTN, HLD, paroxysmal A-fib during COVID, GI bleed, DM2, MGUS, hypothyroidism admitted for gross hematuria developed after her renal biopsy.  Overnight patient's hemoglobin remained stable.  Renal ultrasound showed concerns of possible mass but patient is already undergone biopsy currently pending results.  This will be followed up outpatient.  Assessment & Plan:  Procedure site/perinephric hematoma Acute on chronic anemia MGUS IgG kappa -Patient found to have increase in urine proteinuria therefore renal biopsy performed by IR on 10/7 and postprocedure developed small hematoma therefore admitted for overnight observation.  Renal ultrasound showed concerns of possible mass but patient is already Biopsy currently pending results. -Hemoglobin this morning 7.3, baseline 8.3.  Continue to closely monitor  Paroxysmal atrial fibrillation -Continue Toprol -XL, resume Eliquis  pending endoscopic evaluation outpatient  Essential hypertension -Norvasc , Toprol -XL.  IV as needed  Hypothyroidism - Synthroid   Diabetes mellitus type 2 -Sliding scale and Accu-Cheks  CKD stage III AA -Creatinine around baseline 1.3.  Rheumatoid arthritis/SLE - Management outpatient by rheumatology.   DVT prophylaxis: Place and maintain sequential compression device Start: 07/04/24 1905    Code Status: Full Code Family Communication:   Discharge   PT Follow up Recs:   Subjective:  Feeling well no complaints, wishing to go home. She understands renal biopsy and will be followed up outpatient by her providers  Examination:  General exam: Appears calm and comfortable  Respiratory system: Clear to auscultation. Respiratory effort normal. Cardiovascular system: S1 & S2 heard, RRR. No JVD, murmurs, rubs, gallops or clicks. No pedal edema. Gastrointestinal system: Abdomen is nondistended, soft and nontender. No  organomegaly or masses felt. Normal bowel sounds heard. Central nervous system: Alert and oriented. No focal neurological deficits. Extremities: Symmetric 5 x 5 power. Skin: No rashes, lesions or ulcers Psychiatry: Judgement and insight appear normal. Mood & affect appropriate.

## 2024-07-05 NOTE — Progress Notes (Signed)
 Patient ID: Hannah [name], female   DOB: [birth year], 78 y.o.   MRN: [id number]    Referring Physician(s): Singh,Vikas   Supervising Physician: Philip Cornet  Patient Status:  [facility] - In-pt  Chief Complaint:  Left perinephric hematoma and hematuria after renal biopsy 07/04/24   Subjective:  Pt feeling well today. Continues to have no pain. Reports urinating twice this morning with no blood in urine. Ate breakfast, no n/v. No additional complaints/concerns.   Allergies: Shellfish allergy  and Shellfish-derived products  Medications: Prior to Admission medications   Medication Sig Start Date End Date Taking? Authorizing Provider  albuterol  (PROVENTIL  HFA;VENTOLIN  HFA) 108 (90 Base) MCG/ACT inhaler Inhale 2 puffs into the lungs every 6 (six) hours as needed for wheezing or shortness of breath. 09/02/18  Yes Georgina Speaks, FNP  allopurinol  (ZYLOPRIM ) 100 MG tablet TAKE 1 TABLET BY MOUTH 3 TIMES A WEEK Patient taking differently: Take 100 mg by mouth every Monday, Wednesday, and Friday. 02/07/24  Yes Jarold Medici, MD  amLODipine  (NORVASC ) 5 MG tablet TAKE 1 TABLET(5 MG) BY MOUTH AT BEDTIME Patient taking differently: Take 5 mg by mouth in the morning. 05/22/24  Yes Jarold Medici, MD  atorvastatin  (LIPITOR) 80 MG tablet TAKE 1 TABLET BY MOUTH EVERY DAY 07/12/23  Yes Jarold Medici, MD  budesonide -formoterol  (SYMBICORT ) 160-4.5 MCG/ACT inhaler INHALE 2 PUFFS BY MOUTH TWICE DAILY IN THE MORNING AND IN THE EVENING Patient taking differently: Inhale 2 puffs into the lungs as needed. INHALE 2 PUFFS BY MOUTH TWICE DAILY IN THE MORNING AND IN THE EVENING 09/08/22  Yes Jarold Medici, MD  dapagliflozin  propanediol (FARXIGA ) 10 MG TABS tablet Take 1 tablet (10 mg total) by mouth daily before breakfast. 04/17/24  Yes Jarold Medici, MD  denosumab  (PROLIA ) 60 MG/ML SOSY injection Inject 60 mg into the skin every 6 (six) months. Courier to rheum: [address], [address], [place] [address]. Appt on  12/07/2022 11/26/22 07/04/24 Yes Deveshwar, Maya, MD  hydroxychloroquine  (PLAQUENIL ) 200 MG tablet TAKE 1 TABLET BY MOUTH TWICE DAILY, MONDAY THROUGH FRIDAY ONLY 03/16/24  Yes Cheryl Waddell HERO, PA-C  irbesartan  (AVAPRO ) 300 MG tablet TAKE 1 TABLET(300 MG) BY MOUTH DAILY 01/04/24  Yes Jarold Medici, MD  metoprolol  succinate (TOPROL -XL) 25 MG 24 hr tablet Take 1 tablet (25 mg total) by mouth daily. Please call [phone] to schedule an appointment for future refills. Thank you. 04/07/24  Yes Nahser, Aleene PARAS, MD  pantoprazole  (PROTONIX ) 40 MG tablet Take 40 mg by mouth every morning.   Yes [provider]  Polyethyl Glycol-Propyl Glycol (SYSTANE) 0.4-0.3 % SOLN Place 1-2 drops into both eyes 3 (three) times daily as needed (dry/irritated eyes.).   Yes [provider]  SYNTHROID  88 MCG tablet TAKE 1 TABLET BY MOUTH EVERY DAY MONDAY TO SATURDAY AND OFF ON SUNDAYS Patient taking differently: Take 88 mcg by mouth daily before breakfast. TAKE 1 88 MCG TABLET BY MOUTH ON SATURDAY AND SUNDAY AND  TAKE 1 75 MCG TABLET BY MOUTH EVERY DAY MONDAY THROUGH FRIDAY (samples provided by PCP) 07/29/22  Yes Jarold Medici, MD  Accu-Chek Softclix Lancets lancets Use to check blood sugars daily E11.69 11/04/20   Jarold Medici, MD  Alcohol  Swabs (ALCOHOL  PADS) 70 % PADS Use as directed to check blood sugars 1 time per day dx: e11.22 08/03/22   Jarold Medici, MD  apixaban  (ELIQUIS ) 5 MG TABS tablet Take 1 tablet (5 mg total) by mouth 2 (two) times daily. Patient not taking: Reported on 07/04/2024 01/11/24 01/10/25  Nahser, Aleene PARAS, MD  Ascorbic Acid (VITAMIN C PO) Take 500 mg by mouth daily with lunch. Patient not taking: Reported on 07/04/2024    [provider]  B Complex-C (B-COMPLEX WITH VITAMIN C) tablet Take 1 tablet by mouth daily with lunch. Patient not taking: Reported on 07/04/2024    [provider]  Calcium  Carb-Cholecalciferol 600-800 MG-UNIT TABS Take 1 tablet by mouth daily with  lunch. Patient not taking: Reported on 07/04/2024    [provider]  Cholecalciferol (VITAMIN D  PO) Take 2,000 Units by mouth daily with lunch. Patient not taking: Reported on 07/04/2024    [provider]  glucose blood (ACCU-CHEK GUIDE) test strip USE TO CHECK BLOOD SUGAR DAILY 02/23/23   Jarold Medici, MD  Multiple Vitamin (MULTIVITAMIN WITH MINERALS) TABS tablet Take 1 tablet by mouth daily with lunch. Women's Multivitamin Patient not taking: Reported on 07/04/2024    [provider]     Vital Signs: BP (!) 124/55 (BP Location: Right Arm)   Pulse 60   Temp 98.1 F (36.7 C) (Oral)   Resp 16   Wt 162 lb (73.5 kg)   SpO2 98%   BMI 33.28 kg/m   Physical Exam Vitals and nursing note reviewed.  Constitutional:      Appearance: Normal appearance.  Cardiovascular:     Rate and Rhythm: Normal rate and regular rhythm.  Pulmonary:     Effort: Pulmonary effort is normal.     Breath sounds: Normal breath sounds.  Abdominal:     Palpations: Abdomen is soft.     Tenderness: There is no abdominal tenderness. There is no right CVA tenderness or left CVA tenderness.  Musculoskeletal:     Right lower leg: No edema.     Left lower leg: No edema.  Skin:    General: Skin is warm and dry.  Neurological:     Mental Status: Hannah [name] is alert and oriented to person, place, and time. Mental status is at baseline.     Imaging: US  BIOPSY (KIDNEY) Result Date: 07/04/2024 INDICATION: Chronic kidney disease, stage III.  Request for renal biopsy. EXAM: ULTRASOUND-GUIDED RANDOM RENAL BIOPSY MEDICATIONS: Moderate sedation ANESTHESIA/SEDATION: Moderate (conscious) sedation was employed during this procedure. A total of Versed  2 mg and Fentanyl  100 mcg was administered intravenously by the radiology nurse. Total intra-service moderate Sedation Time: 31 minutes. The patient's level of consciousness and vital signs were monitored continuously by radiology nursing throughout the  procedure under my direct supervision. FLUOROSCOPY TIME:  None COMPLICATIONS: SIR LEVEL B - Normal therapy, includes overnight admission for observation. PROCEDURE: Informed written consent was obtained from the patient after a thorough discussion of the procedural risks, benefits and alternatives. All questions were addressed. A timeout was performed prior to the initiation of the procedure. Patient was placed prone. Both kidneys were evaluated with ultrasound. Left kidney was selected for biopsy. Left flank was prepped and draped in sterile fashion. Maximal barrier sterile technique was utilized including caps, mask, sterile gowns, sterile gloves, sterile drape, hand hygiene and skin antiseptic. Skin was anesthetized using 1% lidocaine . Small incision was made. Using ultrasound guidance, a 17 gauge coaxial needle was directed to the lower pole cortex. Two core biopsies were obtained from the lower pole cortex with an 18 gauge core device. Specimens placed in saline. Gel-Foam slurry was injected as the 17 gauge coaxial needle was retracted. Pressure was applied to the puncture site for approximately 10 minutes while observing with ultrasound. Bandage placed over the puncture site.  FINDINGS: 2 adequate core biopsy specimens were obtained. Perinephric hematoma was immediately identified after the second core biopsy. Pressure was applied to the puncture site for approximately 10 minutes under ultrasound observation. After 10 minutes, the hematoma appeared to be stable in size and there was no evidence for active bleeding based on color Doppler imaging. Patient was asymptomatic after the biopsy and stable hemoglobin approximately 2.5 hours after the biopsy. However, the patient had dark hematuria throughout the recovery period. As a result, the patient will be admitted for observation. IMPRESSION: 1. Ultrasound-guided core biopsy from the left kidney lower pole. 2. Perinephric hematoma identified immediately following  the biopsy. Patient had asymptomatic hematuria after the procedure. Patient will be admitted overnight for observation. Electronically Signed   By: Juliene Balder M.D.   On: 07/04/2024 15:48    Labs:  CBC: Recent Labs    06/05/24 0737 07/04/24 0725 07/04/24 1204 07/05/24 0314  WBC 5.5 4.3 4.7 5.9  HGB 8.7* 8.4* 8.3* 7.3*  HCT 27.2* 27.2* 27.1* 23.7*  PLT 221 142* 182 176    COAGS: Recent Labs    09/23/23 1800 07/04/24 0725  INR 1.9* 1.0    BMP: Recent Labs    09/28/23 0434 10/05/23 1244 11/25/23 0841 12/01/23 1552 03/15/24 1636 05/11/24 1143 06/05/24 0737 07/05/24 0314  NA 133*   < > 142   < > 140 140 142 139  K 3.7   < > 4.3   < > 4.3 4.6 4.4 3.9  CL 107   < > 109   < > 105 106 109 109  CO2 19*   < > 28   < > 17* 23 27 23   GLUCOSE 80   < > 109*   < > 91 73 108* 85  BUN 12   < > 21   < > 23 23 21 20   CALCIUM  7.4*   < > 9.3   < > 9.4 8.7 8.9 8.7*  CREATININE 0.89   < > 1.20*   < > 1.18* 1.33* 1.31* 1.41*  GFRNONAA >60  --  46*  --   --   --  42* 38*   < > = values in this interval not displayed.    LIVER FUNCTION TESTS: Recent Labs    11/25/23 0841 12/01/23 1552 03/15/24 1636 05/11/24 1143 06/05/24 0737 07/05/24 0314  BILITOT 0.4   < > 0.3 0.4 0.3 0.2  AST 21   < > 21 23 19 24   ALT 12   < > 11 14 11 14   ALKPHOS 64  --  71  --  50 41  PROT 6.8   < > 6.5 6.2 6.5 5.0*  ALBUMIN 4.0  --  4.1  --  4.0 2.8*   < > = values in this interval not displayed.    Assessment and Plan:  Day 1 post renal biopsy complicated with perinephric hematoma and hematuria - hematuria resolved as of this morning per patient - still no pain, feels well - hgb 7.3 (8.3 yesterday - has chronic anemia) - ok for discharge home from IR perspective. Hospitalist team made aware  Please reach out to IR with any additional questions or concerns.  Electronically Signed: Kimble VEAR Clas, PA-C 07/05/2024, 9:51 AM   I spent a total of 15 Minutes at the the patient's bedside AND on  the patient's hospital floor or unit, greater than 50% of which was counseling/coordinating care for left kidney biopsy with perinephric hematoma and hematuria follow  up

## 2024-07-05 NOTE — Telephone Encounter (Signed)
 PAP: Patient assistance application for Farxiga  through [employer] ([employer]) has been mailed to pt's home address on file. Provider portion of application will be faxed to provider's office.For renewal 2026.

## 2024-07-05 NOTE — Progress Notes (Signed)
 M.D. patient takes levothyroxine  75 mcq  Monday -Friday and 88 mcg on Sat and Sunday daily . Please clarify order.

## 2024-07-05 NOTE — Progress Notes (Signed)
 PROGRESS NOTE    Hannah [name]  FMW:[id number] DOB: 1945-12-27 DOA: 07/04/2024 PCP: Jarold Medici, MD    Brief Narrative:   78 year old with past medical history of systemic lupus, rheumatoid arthritis, HTN, HLD, paroxysmal A-fib during COVID, GI bleed, DM2, MGUS, hypothyroidism admitted for gross hematuria developed after her renal biopsy.  Overnight patient's hemoglobin remained stable.  Renal ultrasound showed concerns of possible mass but patient is already undergone biopsy currently pending results.  This will be followed up outpatient.  Assessment & Plan:  Procedure site/perinephric hematoma Acute on chronic anemia MGUS IgG kappa -Patient found to have increase in urine proteinuria therefore renal biopsy performed by IR on 10/7 and postprocedure developed small hematoma therefore admitted for overnight observation.  Renal ultrasound showed concerns of possible mass but patient is already Biopsy currently pending results. -Hemoglobin this morning 7.3, baseline 8.3.  Continue to closely monitor  Paroxysmal atrial fibrillation -Continue Toprol -XL, resume Eliquis  pending endoscopic evaluation outpatient  Essential hypertension -Norvasc , Toprol -XL.  IV as needed  Hypothyroidism - Synthroid   Diabetes mellitus type 2 -Sliding scale and Accu-Cheks  CKD stage III AA -Creatinine around baseline 1.3.  Rheumatoid arthritis/SLE - Management outpatient by rheumatology.   DVT prophylaxis: Place and maintain sequential compression device Start: 07/04/24 1905    Code Status: Full Code Family Communication:   Discharge   PT Follow up Recs:   Subjective:  Feeling well no complaints, wishing to go home. She understands renal biopsy and will be followed up outpatient by her providers  Examination:  General exam: Appears calm and comfortable  Respiratory system: Clear to auscultation. Respiratory effort normal. Cardiovascular system: S1 & S2 heard, RRR. No JVD, murmurs,  rubs, gallops or clicks. No pedal edema. Gastrointestinal system: Abdomen is nondistended, soft and nontender. No organomegaly or masses felt. Normal bowel sounds heard. Central nervous system: Alert and oriented. No focal neurological deficits. Extremities: Symmetric 5 x 5 power. Skin: No rashes, lesions or ulcers Psychiatry: Judgement and insight appear normal. Mood & affect appropriate.                Diet Orders (From admission, onward)     Start     Ordered   07/04/24 1725  Diet Carb Modified Fluid consistency: Thin; Room service appropriate? Yes  Diet effective now       Question Answer Comment  Diet-HS Snack? Nothing   Calorie Level Medium 1600-2000   Fluid consistency: Thin   Room service appropriate? Yes      07/04/24 1724            Objective: Vitals:   07/04/24 2305 07/05/24 0427 07/05/24 0735 07/05/24 1205  BP: (!) 110/54 (!) 123/59 (!) 124/55 134/65  Pulse: 61 60 60 (!) 59  Resp: 16 18 16 16   Temp: 98.5 F (36.9 C) 98.8 F (37.1 C) 98.1 F (36.7 C) 97.6 F (36.4 C)  TempSrc: Oral Oral Oral Oral  SpO2: 100% 97% 98% 100%  Weight:  73.5 kg      Intake/Output Summary (Last 24 hours) at 07/05/2024 1400 Last data filed at 07/05/2024 0846 Gross per 24 hour  Intake 600 ml  Output --  Net 600 ml   Filed Weights   07/05/24 0427  Weight: 73.5 kg    Scheduled Meds:  allopurinol   100 mg Oral Q M,W,F   amLODipine   5 mg Oral q AM   atorvastatin   80 mg Oral Daily   fluticasone  furoate-vilanterol  1 puff Inhalation Daily   hydroxychloroquine   200 mg Oral BID   insulin  aspart  0-9 Units Subcutaneous TID WC   [START ON 07/08/2024] levothyroxine   88 mcg Oral Once per day on Sunday Saturday   And   [START ON 07/06/2024] levothyroxine   75 mcg Oral Once per day on Monday Tuesday Wednesday Thursday Friday   metoprolol  succinate  25 mg Oral Daily   sodium chloride  flush  3 mL Intravenous Q12H   Continuous Infusions:  sodium chloride       Nutritional  status     Body mass index is 33.28 kg/m.  Data Reviewed:   CBC: Recent Labs  Lab 07/04/24 0725 07/04/24 1204 07/05/24 0314  WBC 4.3 4.7 5.9  HGB 8.4* 8.3* 7.3*  HCT 27.2* 27.1* 23.7*  MCV 103.0* 101.9* 101.3*  PLT 142* 182 176   Basic Metabolic Panel: Recent Labs  Lab 07/05/24 0314  NA 139  K 3.9  CL 109  CO2 23  GLUCOSE 85  BUN 20  CREATININE 1.41*  CALCIUM  8.7*   GFR: Estimated Creatinine Clearance: 28.4 mL/min (A) (by C-G formula based on SCr of 1.41 mg/dL (H)). Liver Function Tests: Recent Labs  Lab 07/05/24 0314  AST 24  ALT 14  ALKPHOS 41  BILITOT 0.2  PROT 5.0*  ALBUMIN 2.8*   No results for input(s): LIPASE, AMYLASE in the last 168 hours. No results for input(s): AMMONIA in the last 168 hours. Coagulation Profile: Recent Labs  Lab 07/04/24 0725  INR 1.0   Cardiac Enzymes: No results for input(s): CKTOTAL, CKMB, CKMBINDEX, TROPONINI in the last 168 hours. BNP (last 3 results) No results for input(s): PROBNP in the last 8760 hours. HbA1C: No results for input(s): HGBA1C in the last 72 hours. CBG: Recent Labs  Lab 07/04/24 0717 07/04/24 2126 07/05/24 0606  GLUCAP 76 115* 86   Lipid Profile: No results for input(s): CHOL, HDL, LDLCALC, TRIG, CHOLHDL, LDLDIRECT in the last 72 hours. Thyroid  Function Tests: No results for input(s): TSH, T4TOTAL, FREET4, T3FREE, THYROIDAB in the last 72 hours. Anemia Panel: No results for input(s): VITAMINB12, FOLATE, FERRITIN, TIBC, IRON , RETICCTPCT in the last 72 hours. Sepsis Labs: No results for input(s): PROCALCITON, LATICACIDVEN in the last 168 hours.  No results found for this or any previous visit (from the past 240 hours).       Radiology Studies: US  RENAL Result Date: 07/05/2024 CLINICAL DATA:  Follow-up left perinephric hematoma following ultrasound-guided core needle biopsy on 07/04/2024 for stage 3 chronic kidney disease. EXAM:  RENAL / URINARY TRACT ULTRASOUND COMPLETE COMPARISON:  07/04/2024 and 12/04/2015.  MR dated 03/06/2016. FINDINGS: Right Kidney: Renal measurements: 7.9 x 4.2 x 4.2 cm = volume: 73.1 mL. Diffusely echogenic. Multiple simple cysts not requiring imaging follow-up. No hydronephrosis. Left Kidney: Renal measurements: 9.0 x 5.1 x 4.4 cm = volume: 106.9 mL. Diffusely echogenic. 2.6 x 2.3 x 1.8 cm cortical based cystic and solid mass. The solid component is polypoid and shape and has prominent internal blood flow with color Doppler. There are additional simple appearing cysts not requiring imaging follow-up. Curvilinear perinephric hematoma measuring 7 mm in maximum thickness in the sagittal plane and 12 mm in maximum thickness in the axial plane. This is better defined, smaller and more hypoechoic than the perinephric hematoma seen following biopsy yesterday, measuring 2.1 cm in maximum thickness in the sagittal plane yesterday. Bladder: Appears normal for degree of bladder distention. Other: None. IMPRESSION: 1. 2.6 cm complex cystic and solid left renal mass with prominent internal blood flow with color Doppler.  This is suspicious for a renal cell carcinoma. Depending on the results from the recent renal biopsy, further evaluation with pre and postcontrast MRI of the kidneys is recommended. 2. Small left perinephric hematoma, decreased in size and more hypoechoic than seen following biopsy yesterday. 3. Diffusely echogenic kidneys bilaterally consistent with medical renal disease. 4. Bilateral simple cysts not requiring imaging follow-up. These results will be called to the ordering clinician or representative by the Radiologist Assistant, and communication documented in the PACS or [employer]. Electronically Signed   By: Elspeth Bathe M.D.   On: 07/05/2024 11:47   US  BIOPSY (KIDNEY) Result Date: 07/04/2024 INDICATION: Chronic kidney disease, stage III.  Request for renal biopsy. EXAM: ULTRASOUND-GUIDED RANDOM  RENAL BIOPSY MEDICATIONS: Moderate sedation ANESTHESIA/SEDATION: Moderate (conscious) sedation was employed during this procedure. A total of Versed  2 mg and Fentanyl  100 mcg was administered intravenously by the radiology nurse. Total intra-service moderate Sedation Time: 31 minutes. The patient's level of consciousness and vital signs were monitored continuously by radiology nursing throughout the procedure under my direct supervision. FLUOROSCOPY TIME:  None COMPLICATIONS: SIR LEVEL B - Normal therapy, includes overnight admission for observation. PROCEDURE: Informed written consent was obtained from the patient after a thorough discussion of the procedural risks, benefits and alternatives. All questions were addressed. A timeout was performed prior to the initiation of the procedure. Patient was placed prone. Both kidneys were evaluated with ultrasound. Left kidney was selected for biopsy. Left flank was prepped and draped in sterile fashion. Maximal barrier sterile technique was utilized including caps, mask, sterile gowns, sterile gloves, sterile drape, hand hygiene and skin antiseptic. Skin was anesthetized using 1% lidocaine . Small incision was made. Using ultrasound guidance, a 17 gauge coaxial needle was directed to the lower pole cortex. Two core biopsies were obtained from the lower pole cortex with an 18 gauge core device. Specimens placed in saline. Gel-Foam slurry was injected as the 17 gauge coaxial needle was retracted. Pressure was applied to the puncture site for approximately 10 minutes while observing with ultrasound. Bandage placed over the puncture site. FINDINGS: 2 adequate core biopsy specimens were obtained. Perinephric hematoma was immediately identified after the second core biopsy. Pressure was applied to the puncture site for approximately 10 minutes under ultrasound observation. After 10 minutes, the hematoma appeared to be stable in size and there was no evidence for active bleeding  based on color Doppler imaging. Patient was asymptomatic after the biopsy and stable hemoglobin approximately 2.5 hours after the biopsy. However, the patient had dark hematuria throughout the recovery period. As a result, the patient will be admitted for observation. IMPRESSION: 1. Ultrasound-guided core biopsy from the left kidney lower pole. 2. Perinephric hematoma identified immediately following the biopsy. Patient had asymptomatic hematuria after the procedure. Patient will be admitted overnight for observation. Electronically Signed   By: Juliene Balder M.D.   On: 07/04/2024 15:48           LOS: 1 day   Time spent= 35 mins    Burgess JAYSON Dare, MD [facility]  If 7PM-7AM, please contact night-coverage  07/05/2024, 2:00 PM

## 2024-07-05 NOTE — Discharge Summary (Signed)
 Physician Discharge Summary  Hannah [name] FMW:[id number] DOB: [birth year] DOA: 07/04/2024  PCP: Jarold Medici, MD  Admit date: 07/04/2024 Discharge date: 07/05/2024  Admitted From: Home Disposition:  Home  Recommendations for Outpatient Follow-up:  Follow up with PCP in 1-2 weeks Please obtain BMP/CBC in one week your next doctors visit.  Outpatient renal Biopsy follow up Ok to resume outptn Eliquis  if cleared by her GI team. Plan for EGD later this week per ptn    Discharge Condition: Stable CODE STATUS: Full Diet recommendation: Cardiac  Brief/Interim Summary: Brief Narrative:   78 year old with past medical history of systemic lupus, rheumatoid arthritis, HTN, HLD, paroxysmal A-fib during COVID, GI bleed, DM2, MGUS, hypothyroidism admitted for gross hematuria developed after her renal biopsy.  Overnight patient's hemoglobin remained stable.  Renal ultrasound showed concerns of possible mass but patient is already undergone biopsy currently pending results.  This will be followed up outpatient.  Assessment & Plan:  Procedure site/perinephric hematoma Acute on chronic anemia MGUS IgG kappa -Patient found to have increase in urine proteinuria therefore renal biopsy performed by IR on 10/7 and postprocedure developed small hematoma therefore admitted for overnight observation.  Renal ultrasound showed concerns of possible mass but patient is already Biopsy currently pending results. -Hemoglobin this morning 7.3, baseline 8.3.  Continue to closely monitor  Paroxysmal atrial fibrillation -Continue Toprol -XL, resume Eliquis  pending endoscopic evaluation outpatient  Essential hypertension -Norvasc , Toprol -XL.  IV as needed  Hypothyroidism - Synthroid   Diabetes mellitus type 2 -Sliding scale and Accu-Cheks  CKD stage III AA -Creatinine around baseline 1.3.  Rheumatoid arthritis/SLE - Management outpatient by rheumatology.   DVT prophylaxis: Place and maintain  sequential compression device Start: 07/04/24 1905    Code Status: Full Code Family Communication:   Discharge   PT Follow up Recs:   Subjective:  Feeling well no complaints, wishing to go home. She understands renal biopsy and will be followed up outpatient by her providers  Examination:  General exam: Appears calm and comfortable  Respiratory system: Clear to auscultation. Respiratory effort normal. Cardiovascular system: S1 & S2 heard, RRR. No JVD, murmurs, rubs, gallops or clicks. No pedal edema. Gastrointestinal system: Abdomen is nondistended, soft and nontender. No organomegaly or masses felt. Normal bowel sounds heard. Central nervous system: Alert and oriented. No focal neurological deficits. Extremities: Symmetric 5 x 5 power. Skin: No rashes, lesions or ulcers Psychiatry: Judgement and insight appear normal. Mood & affect appropriate.    Discharge Diagnoses:  Principal Problem:   History of gross hematuria      Discharge Exam: Vitals:   07/05/24 0735 07/05/24 1205  BP: (!) 124/55 134/65  Pulse: 60 (!) 59  Resp: 16 16  Temp: 98.1 F (36.7 C) 97.6 F (36.4 C)  SpO2: 98% 100%   Vitals:   07/04/24 2305 07/05/24 0427 07/05/24 0735 07/05/24 1205  BP: (!) 110/54 (!) 123/59 (!) 124/55 134/65  Pulse: 61 60 60 (!) 59  Resp: 16 18 16 16   Temp: 98.5 F (36.9 C) 98.8 F (37.1 C) 98.1 F (36.7 C) 97.6 F (36.4 C)  TempSrc: Oral Oral Oral Oral  SpO2: 100% 97% 98% 100%  Weight:  73.5 kg        Discharge Instructions   Allergies as of 07/05/2024       Reactions   Shellfish Allergy  Anaphylaxis, Hives, Other (See Comments)   Shellfish-derived Products Anaphylaxis, Other (See Comments)        Medication List     TAKE these medications  Accu-Chek Guide test strip Generic drug: glucose blood USE TO CHECK BLOOD SUGAR DAILY   Accu-Chek Softclix Lancets lancets Use to check blood sugars daily E11.69   albuterol  108 (90 Base) MCG/ACT  inhaler Commonly known as: VENTOLIN  HFA Inhale 2 puffs into the lungs every 6 (six) hours as needed for wheezing or shortness of breath.   Alcohol  Pads 70 % Pads Use as directed to check blood sugars 1 time per day dx: e11.22   allopurinol  100 MG tablet Commonly known as: ZYLOPRIM  TAKE 1 TABLET BY MOUTH 3 TIMES A WEEK What changed: See the new instructions.   amLODipine  5 MG tablet Commonly known as: NORVASC  TAKE 1 TABLET(5 MG) BY MOUTH AT BEDTIME What changed: See the new instructions.   apixaban  5 MG Tabs tablet Commonly known as: ELIQUIS  Take 1 tablet (5 mg total) by mouth 2 (two) times daily.   atorvastatin  80 MG tablet Commonly known as: LIPITOR TAKE 1 TABLET BY MOUTH EVERY DAY   B-complex with vitamin C tablet Take 1 tablet by mouth daily with lunch.   budesonide -formoterol  160-4.5 MCG/ACT inhaler Commonly known as: SYMBICORT  INHALE 2 PUFFS BY MOUTH TWICE DAILY IN THE MORNING AND IN THE EVENING What changed:  how much to take how to take this when to take this reasons to take this   Calcium  Carb-Cholecalciferol 600-800 MG-UNIT Tabs Take 1 tablet by mouth daily with lunch.   dapagliflozin  propanediol 10 MG Tabs tablet Commonly known as: Farxiga  Take 1 tablet (10 mg total) by mouth daily before breakfast.   hydroxychloroquine  200 MG tablet Commonly known as: PLAQUENIL  TAKE 1 TABLET BY MOUTH TWICE DAILY, MONDAY THROUGH FRIDAY ONLY   irbesartan  300 MG tablet Commonly known as: AVAPRO  TAKE 1 TABLET(300 MG) BY MOUTH DAILY   metoprolol  succinate 25 MG 24 hr tablet Commonly known as: TOPROL -XL Take 1 tablet (25 mg total) by mouth daily. Please call [phone] to schedule an appointment for future refills. Thank you.   multivitamin with minerals Tabs tablet Take 1 tablet by mouth daily with lunch. Women's Multivitamin   pantoprazole  40 MG tablet Commonly known as: PROTONIX  Take 40 mg by mouth every morning.   Prolia  60 MG/ML Sosy injection Generic drug:  denosumab  Inject 60 mg into the skin every 6 (six) months. Courier to rheum: [address], [place] [address]. Appt on 12/07/2022   Synthroid  88 MCG tablet Generic drug: levothyroxine  TAKE 1 TABLET BY MOUTH EVERY DAY MONDAY TO SATURDAY AND OFF ON SUNDAYS What changed:  how much to take how to take this when to take this additional instructions   Systane 0.4-0.3 % Soln Generic drug: Polyethyl Glycol-Propyl Glycol Place 1-2 drops into both eyes 3 (three) times daily as needed (dry/irritated eyes.).   VITAMIN C PO Take 500 mg by mouth daily with lunch.   VITAMIN D  PO Take 2,000 Units by mouth daily with lunch.        Follow-up Information     Jarold Medici, MD Follow up in 1 week(s).   Specialty: Internal Medicine Contact information: [address] [place] [address] [phone]                Allergies  Allergen Reactions   Shellfish Allergy  Anaphylaxis, Hives and Other (See Comments)   Shellfish-Derived Products Anaphylaxis and Other (See Comments)    You were cared for by a hospitalist during your hospital stay. If you have any questions about your discharge medications or the care you received while  you were in the hospital after you are discharged, you can call the unit and asked to speak with the hospitalist on call if the hospitalist that took care of you is not available. Once you are discharged, your primary care physician will handle any further medical issues. Please note that no refills for any discharge medications will be authorized once you are discharged, as it is imperative that you return to your primary care physician (or establish a relationship with a primary care physician if you do not have one) for your aftercare needs so that they can reassess your need for medications and monitor your lab values.  You were cared for by a hospitalist during your hospital stay. If you have any questions about your discharge  medications or the care you received while you were in the hospital after you are discharged, you can call the unit and asked to speak with the hospitalist on call if the hospitalist that took care of you is not available. Once you are discharged, your primary care physician will handle any further medical issues. Please note that NO REFILLS for any discharge medications will be authorized once you are discharged, as it is imperative that you return to your primary care physician (or establish a relationship with a primary care physician if you do not have one) for your aftercare needs so that they can reassess your need for medications and monitor your lab values.  Please request your Prim.MD to go over all Hospital Tests and Procedure/Radiological results at the follow up, please get all Hospital records sent to your Prim MD by signing hospital release before you go home.  Get CBC, CMP, 2 view Chest X ray checked  by Primary MD during your next visit or SNF MD in 5-7 days ( we routinely change or add medications that can affect your baseline labs and fluid status, therefore we recommend that you get the mentioned basic workup next visit with your PCP, your PCP may decide not to get them or add new tests based on their clinical decision)  On your next visit with your primary care physician please Get Medicines reviewed and adjusted.  If you experience worsening of your admission symptoms, develop shortness of breath, life threatening emergency, suicidal or homicidal thoughts you must seek medical attention immediately by calling 911 or calling your MD immediately  if symptoms less severe.  You Must read complete instructions/literature along with all the possible adverse reactions/side effects for all the Medicines you take and that have been prescribed to you. Take any new Medicines after you have completely understood and accpet all the possible adverse reactions/side effects.   Do not drive, operate  heavy machinery, perform activities at heights, swimming or participation in water  activities or provide baby sitting services if your were admitted for syncope or siezures until you have seen by Primary MD or a Neurologist and advised to do so again.  Do not drive when taking Pain medications.   Procedures/Studies: US  RENAL Result Date: 07/05/2024 CLINICAL DATA:  Follow-up left perinephric hematoma following ultrasound-guided core needle biopsy on 07/04/2024 for stage 3 chronic kidney disease. EXAM: RENAL / URINARY TRACT ULTRASOUND COMPLETE COMPARISON:  07/04/2024 and 12/04/2015.  MR dated 03/06/2016. FINDINGS: Right Kidney: Renal measurements: 7.9 x 4.2 x 4.2 cm = volume: 73.1 mL. Diffusely echogenic. Multiple simple cysts not requiring imaging follow-up. No hydronephrosis. Left Kidney: Renal measurements: 9.0 x 5.1 x 4.4 cm = volume: 106.9 mL. Diffusely echogenic. 2.6 x 2.3 x 1.8  cm cortical based cystic and solid mass. The solid component is polypoid and shape and has prominent internal blood flow with color Doppler. There are additional simple appearing cysts not requiring imaging follow-up. Curvilinear perinephric hematoma measuring 7 mm in maximum thickness in the sagittal plane and 12 mm in maximum thickness in the axial plane. This is better defined, smaller and more hypoechoic than the perinephric hematoma seen following biopsy yesterday, measuring 2.1 cm in maximum thickness in the sagittal plane yesterday. Bladder: Appears normal for degree of bladder distention. Other: None. IMPRESSION: 1. 2.6 cm complex cystic and solid left renal mass with prominent internal blood flow with color Doppler. This is suspicious for a renal cell carcinoma. Depending on the results from the recent renal biopsy, further evaluation with pre and postcontrast MRI of the kidneys is recommended. 2. Small left perinephric hematoma, decreased in size and more hypoechoic than seen following biopsy yesterday. 3. Diffusely  echogenic kidneys bilaterally consistent with medical renal disease. 4. Bilateral simple cysts not requiring imaging follow-up. These results will be called to the ordering clinician or representative by the Radiologist Assistant, and communication documented in the PACS or [employer]. Electronically Signed   By: Elspeth Bathe M.D.   On: 07/05/2024 11:47   US  BIOPSY (KIDNEY) Result Date: 07/04/2024 INDICATION: Chronic kidney disease, stage III.  Request for renal biopsy. EXAM: ULTRASOUND-GUIDED RANDOM RENAL BIOPSY MEDICATIONS: Moderate sedation ANESTHESIA/SEDATION: Moderate (conscious) sedation was employed during this procedure. A total of Versed  2 mg and Fentanyl  100 mcg was administered intravenously by the radiology nurse. Total intra-service moderate Sedation Time: 31 minutes. The patient's level of consciousness and vital signs were monitored continuously by radiology nursing throughout the procedure under my direct supervision. FLUOROSCOPY TIME:  None COMPLICATIONS: SIR LEVEL B - Normal therapy, includes overnight admission for observation. PROCEDURE: Informed written consent was obtained from the patient after a thorough discussion of the procedural risks, benefits and alternatives. All questions were addressed. A timeout was performed prior to the initiation of the procedure. Patient was placed prone. Both kidneys were evaluated with ultrasound. Left kidney was selected for biopsy. Left flank was prepped and draped in sterile fashion. Maximal barrier sterile technique was utilized including caps, mask, sterile gowns, sterile gloves, sterile drape, hand hygiene and skin antiseptic. Skin was anesthetized using 1% lidocaine . Small incision was made. Using ultrasound guidance, a 17 gauge coaxial needle was directed to the lower pole cortex. Two core biopsies were obtained from the lower pole cortex with an 18 gauge core device. Specimens placed in saline. Gel-Foam slurry was injected as the 17 gauge  coaxial needle was retracted. Pressure was applied to the puncture site for approximately 10 minutes while observing with ultrasound. Bandage placed over the puncture site. FINDINGS: 2 adequate core biopsy specimens were obtained. Perinephric hematoma was immediately identified after the second core biopsy. Pressure was applied to the puncture site for approximately 10 minutes under ultrasound observation. After 10 minutes, the hematoma appeared to be stable in size and there was no evidence for active bleeding based on color Doppler imaging. Patient was asymptomatic after the biopsy and stable hemoglobin approximately 2.5 hours after the biopsy. However, the patient had dark hematuria throughout the recovery period. As a result, the patient will be admitted for observation. IMPRESSION: 1. Ultrasound-guided core biopsy from the left kidney lower pole. 2. Perinephric hematoma identified immediately following the biopsy. Patient had asymptomatic hematuria after the procedure. Patient will be admitted overnight for observation. Electronically Signed   By: Juliene  Philip M.D.   On: 07/04/2024 15:48     The results of significant diagnostics from this hospitalization (including imaging, microbiology, ancillary and laboratory) are listed below for reference.     Microbiology: No results found for this or any previous visit (from the past 240 hours).   Labs: BNP (last 3 results) No results for input(s): BNP in the last 8760 hours. Basic Metabolic Panel: Recent Labs  Lab 07/05/24 0314  NA 139  K 3.9  CL 109  CO2 23  GLUCOSE 85  BUN 20  CREATININE 1.41*  CALCIUM  8.7*   Liver Function Tests: Recent Labs  Lab 07/05/24 0314  AST 24  ALT 14  ALKPHOS 41  BILITOT 0.2  PROT 5.0*  ALBUMIN 2.8*   No results for input(s): LIPASE, AMYLASE in the last 168 hours. No results for input(s): AMMONIA in the last 168 hours. CBC: Recent Labs  Lab 07/04/24 0725 07/04/24 1204 07/05/24 0314  WBC 4.3  4.7 5.9  HGB 8.4* 8.3* 7.3*  HCT 27.2* 27.1* 23.7*  MCV 103.0* 101.9* 101.3*  PLT 142* 182 176   Cardiac Enzymes: No results for input(s): CKTOTAL, CKMB, CKMBINDEX, TROPONINI in the last 168 hours. BNP: Invalid input(s): POCBNP CBG: Recent Labs  Lab 07/04/24 0717 07/04/24 2126 07/05/24 0606 07/05/24 1240  GLUCAP 76 115* 86 77   D-Dimer No results for input(s): DDIMER in the last 72 hours. Hgb A1c No results for input(s): HGBA1C in the last 72 hours. Lipid Profile No results for input(s): CHOL, HDL, LDLCALC, TRIG, CHOLHDL, LDLDIRECT in the last 72 hours. Thyroid  function studies No results for input(s): TSH, T4TOTAL, T3FREE, THYROIDAB in the last 72 hours.  Invalid input(s): FREET3 Anemia work up No results for input(s): VITAMINB12, FOLATE, FERRITIN, TIBC, IRON , RETICCTPCT in the last 72 hours. Urinalysis    Component Value Date/Time   COLORURINE YELLOW 09/23/2023 1606   APPEARANCEUR HAZY (A) 09/23/2023 1606   LABSPEC 1.014 09/23/2023 1606   PHURINE 5.0 09/23/2023 1606   GLUCOSEU >=500 (A) 09/23/2023 1606   HGBUR NEGATIVE 09/23/2023 1606   BILIRUBINUR NEGATIVE 09/23/2023 1606   BILIRUBINUR Negative 01/12/2023 1629   KETONESUR 5 (A) 09/23/2023 1606   PROTEINUR 30 (A) 09/23/2023 1606   UROBILINOGEN 0.2 01/12/2023 1629   NITRITE NEGATIVE 09/23/2023 1606   LEUKOCYTESUR SMALL (A) 09/23/2023 1606   Sepsis Labs Recent Labs  Lab 07/04/24 0725 07/04/24 1204 07/05/24 0314  WBC 4.3 4.7 5.9   Microbiology No results found for this or any previous visit (from the past 240 hours).   Time coordinating discharge:  I have spent 35 minutes face to face with the patient and on the ward discussing the patients care, assessment, plan and disposition with other care givers. >50% of the time was devoted counseling the patient about the risks and benefits of treatment/Discharge disposition and coordinating care.    SIGNED:   Burgess JAYSON Dare, MD  [facility] 07/05/2024, 3:19 PM   If 7PM-7AM, please contact night-coverage

## 2024-07-06 ENCOUNTER — Telehealth: Payer: Self-pay | Admitting: Pharmacist

## 2024-07-06 ENCOUNTER — Encounter (HOSPITAL_COMMUNITY): Payer: Self-pay

## 2024-07-06 ENCOUNTER — Telehealth: Payer: Self-pay

## 2024-07-06 DIAGNOSIS — E038 Other specified hypothyroidism: Secondary | ICD-10-CM

## 2024-07-06 MED ORDER — SYNTHROID 75 MCG PO TABS: ORAL_TABLET | ORAL | 2 refills | Status: AC

## 2024-07-06 MED ORDER — SYNTHROID 88 MCG PO TABS: ORAL_TABLET | ORAL | 2 refills | Status: AC

## 2024-07-06 NOTE — Progress Notes (Signed)
 07/06/2024  Patient ID: Hannah [name], female   DOB: 10/16/1945, 78 y.o.   MRN: [id number]  Patient called and wondered about getting her Synthroid  renewed for next year through [facility].  HIPAA identifers were obtained.    Ms. Ysidra Bashore is a 78 year old female with multiple medical conditions including but not limited to:  type 2 diabetes, a fib, GERD, asthma, hypothyroidism, hyperlipidemia, osteoarthritis of the knee, and vitamin D  deficiency.  She was recently hospitalized for gross hematuria  that developed after her renal biopsy.    Her discharge medication list only listed Synthroid  88 mcg with instructions of taking 1 tablet daily except Saturday and Sunday.  Prior to her hospitalization, Patient had been taking Synthroid  88 mcg 1 tablet on Saturday and Sunday and Synthroid  75 mcg 1 tablet daily Monday thru Friday.  Despite what the Hospital discharge stated, Patient continued to take what she was taking prior to her short hospitalization.  Her chart was reviewed. She has been getting Synthroid  88 mcg through [facility] Patient Assistance Program and getting Synthroid  75 mcg samples from the office.  I checked with the Clinic staff. The Office does not have samples.    Spoke with Dr. Jarold. It was decided to adjust the Patient's medication list to reflect what she has been doing.  Her last thyroid  lab values were: Free T4 1.73 and TSH 0.446 (03/15/2024)  Called [facility].  Patient is approved through the end of the year. The representative said the Patient could get both the Synthroid  75 mcg and 88 mcg through [facility]. New prescriptions were sent to [facility] as they are the processing pharmacy for [facility].  A note will be sent to [name], CPhT to prepare renewal application for Synthroid  to be sent to the Patient. Patient confirmed she called [employer] herself and has been approved to receive Farxiga  through 2026.   Medications Reviewed Today     Reviewed by  Jolee Cassius PARAS, [facility] (Pharmacist) on 07/06/24 at 1918  Med List Status: <None>   Medication Order Taking? Sig Documenting Provider Last Dose Status Informant  Accu-Chek Softclix Lancets lancets [id number] Yes Use to check blood sugars daily E11.69 Jarold Medici, MD  Active Self, Pharmacy Records  albuterol  (PROVENTIL  HFA;VENTOLIN  HFA) 108 [phone] Base) MCG/ACT inhaler [id number] Yes Inhale 2 puffs into the lungs every 6 (six) hours as needed for wheezing or shortness of breath. Georgina Speaks, FNP  Active Self, Pharmacy Records           Med Note (LITTLE, CLAYBORNE CROME   Thu Feb 10, 2024  9:36 AM)    Alcohol  Swabs (ALCOHOL  PADS) 70 % PADS [id number] Yes Use as directed to check blood sugars 1 time per day dx: e11.22 Jarold Medici, MD  Active Self, Pharmacy Records  allopurinol  (ZYLOPRIM ) 100 MG tablet [id number] Yes TAKE 1 TABLET BY MOUTH 3 TIMES A WEEK Sanders, Robyn, MD  Active Self, Pharmacy Records  amLODipine  (NORVASC ) 5 MG tablet [id number] Yes TAKE 1 TABLET(5 MG) BY MOUTH AT BEDTIME Jarold Medici, MD  Active Self, Pharmacy Records  apixaban  (ELIQUIS ) 5 MG TABS tablet [id number] Yes Take 1 tablet (5 mg total) by mouth 2 (two) times daily. Nahser, Aleene PARAS, MD  Active Self, Pharmacy Records  Ascorbic Acid (VITAMIN C PO) [id number] Yes Take 500 mg by mouth daily with lunch. [provider]  Active Self, Pharmacy Records  atorvastatin  (LIPITOR) 80 MG tablet [id number] Yes TAKE 1 TABLET BY MOUTH EVERY DAY Jarold Medici, MD  Active  Self, Pharmacy Records           Med Note EFRAIM, ALFREIDA CROME   Tue Jul 04, 2024  6:16 PM) Patient could not remember if this was one of the medications she took this morning  B Complex-C (B-COMPLEX WITH VITAMIN C) tablet [id number] Yes Take 1 tablet by mouth daily with lunch. [provider]  Active Self, Pharmacy Records  budesonide -formoterol  (SYMBICORT ) 160-4.5 MCG/ACT inhaler [id number] Yes INHALE 2 PUFFS BY MOUTH TWICE DAILY IN THE MORNING AND IN THE EVENING   Patient taking differently: Inhale 2 puffs into the lungs as needed. INHALE 2 PUFFS BY MOUTH TWICE DAILY IN THE MORNING AND IN THE KARNA Jarold Medici, MD  Active Self, Pharmacy Records  Calcium  Carb-Cholecalciferol 600-800 MG-UNIT TABS [id number] Yes Take 1 tablet by mouth daily with lunch. [provider]  Active Self, Pharmacy Records  Cholecalciferol (VITAMIN D  PO) [id number] Yes Take 2,000 Units by mouth daily with lunch. [provider]  Active Self, Pharmacy Records           Med Note (LITTLE, ANGEL L   Thu Feb 10, 2024  9:35 AM)    dapagliflozin  propanediol (FARXIGA ) 10 MG TABS tablet [id number] Yes Take 1 tablet (10 mg total) by mouth daily before breakfast. Jarold Medici, MD  Active Self, Pharmacy Records           Med Note MACK, CASSIUS PARAS   Thu Jul 06, 2024  7:18 PM) GETS THROUGH PATIENT ASSISTANCE  denosumab  (PROLIA ) 60 MG/ML SOSY injection [id number] Yes Inject 60 mg into the skin every 6 (six) months. Courier to rheum: [address], [place] [address]. Appt on 12/07/2022 Dolphus Reiter, MD  Active Self, Pharmacy Records           Med Note EFRAIM, ALFREIDA CROME   Tue Jul 04, 2024  6:11 PM) Patient stated her next shot is in January  glucose blood (ACCU-CHEK GUIDE) test strip [id number] Yes USE TO CHECK BLOOD SUGAR DAILY Jarold Medici, MD  Active Self, Pharmacy Records  hydroxychloroquine  (PLAQUENIL ) 200 MG tablet [id number] Yes TAKE 1 TABLET BY MOUTH TWICE DAILY, MONDAY THROUGH FRIDAY ONLY Cheryl Waddell HERO, PA-C  Active Self, Pharmacy Records  irbesartan  (AVAPRO ) 300 MG tablet [id number] Yes TAKE 1 TABLET(300 MG) BY MOUTH DAILY Jarold Medici, MD  Active Self, Pharmacy Records  metoprolol  succinate (TOPROL -XL) 25 MG 24 hr tablet [id number] Yes Take 1 tablet (25 mg total) by mouth daily. Please call [phone] to schedule an appointment for future refills. Thank you. Nahser, Aleene PARAS, MD  Active Self, Pharmacy Records  Multiple Vitamin (MULTIVITAMIN WITH  MINERALS) TABS tablet [id number] Yes Take 1 tablet by mouth daily with lunch. Women's Multivitamin [provider]  Active Self, Pharmacy Records  pantoprazole  (PROTONIX ) 40 MG tablet [id number] Yes Take 40 mg by mouth every morning. [provider]  Active Self, Pharmacy Records  Polyethyl Glycol-Propyl Glycol (SYSTANE) 0.4-0.3 % SOLN [id number] Yes Place 1-2 drops into both eyes 3 (three) times daily as needed (dry/irritated eyes.). [provider]  Active Self, Pharmacy Records  SYNTHROID  75 MCG tablet [id number] Yes Take 1 tablet MONDAY through FRIDAY Sanders, Robyn, MD  Active   SYNTHROID  88 MCG tablet [id number] Yes TAKE 1 TABLET BY MOUTH EVERY SATURDAY AND SUNDAY Sanders, Robyn, MD  Active              Lab Results  Component Value Date   HGBA1C 5.1 03/15/2024   HGBA1C 4.8 09/13/2023  HGBA1C 5.4 07/05/2023    Patient's medications were reviewed via telephone:   Plan: Follow up in 3 weeks.   Cassius DOROTHA Brought, PharmD, BCACP Clinical Pharmacist [phone]

## 2024-07-06 NOTE — Transitions of Care (Post Inpatient/ED Visit) (Signed)
 07/06/2024  Name: Hannah [name] MRN: [id number] DOB: 08-24-1946  Today's TOC FU Call Status: Today's TOC FU Call Status:: Successful TOC FU Call Completed TOC FU Call Complete Date: 07/06/24 Patient's Name and Date of Birth confirmed.  Transition Care Management Follow-up Telephone Call Date of Discharge: 07/05/24 Discharge Facility: [facility]) Type of Discharge: Inpatient Admission Primary Inpatient Discharge Diagnosis:: hematuria How have you been since you were released from the hospital?: Better Any questions or concerns?: No  Items Reviewed: Did you receive and understand the discharge instructions provided?: Yes Medications obtained,verified, and reconciled?: Yes (Medications Reviewed) Any new allergies since your discharge?: No Dietary orders reviewed?: Yes Do you have support at home?: No  Medications Reviewed Today: Medications Reviewed Today     Reviewed by Emmitt Pan, LPN (Licensed Practical Nurse) on 07/06/24 at 1442  Med List Status: <None>   Medication Order Taking? Sig Documenting Provider Last Dose Status Informant  Accu-Chek Softclix Lancets lancets [id number] Yes Use to check blood sugars daily E11.69 Jarold Medici, MD  Active Self, Pharmacy Records  albuterol  (PROVENTIL  HFA;VENTOLIN  HFA) 108 (90 Base) MCG/ACT inhaler [id number] Yes Inhale 2 puffs into the lungs every 6 (six) hours as needed for wheezing or shortness of breath. Georgina Speaks, FNP  Active Self, Pharmacy Records           Med Note (LITTLE, CLAYBORNE CROME   Thu Feb 10, 2024  9:36 AM)    Alcohol  Swabs (ALCOHOL  PADS) 70 % PADS [id number] Yes Use as directed to check blood sugars 1 time per day dx: e11.22 Jarold Medici, MD  Active Self, Pharmacy Records  allopurinol  (ZYLOPRIM ) 100 MG tablet [id number] Yes TAKE 1 TABLET BY MOUTH 3 TIMES A WEEK Sanders, Robyn, MD  Active Self, Pharmacy Records  amLODipine  (NORVASC ) 5 MG tablet [id number] Yes TAKE 1 TABLET(5 MG) BY MOUTH AT BEDTIME Jarold Medici, MD  Active Self, Pharmacy Records  apixaban  (ELIQUIS ) 5 MG TABS tablet [id number] Yes Take 1 tablet (5 mg total) by mouth 2 (two) times daily. Nahser, Aleene PARAS, MD  Active Self, Pharmacy Records  Ascorbic Acid (VITAMIN C PO) [id number] Yes Take 500 mg by mouth daily with lunch. [provider]  Active Self, Pharmacy Records  atorvastatin  (LIPITOR) 80 MG tablet [id number] Yes TAKE 1 TABLET BY MOUTH EVERY DAY Jarold Medici, MD  Active Self, Pharmacy Records           Med Note [facility], ALFREIDA CROME   Tue Jul 04, 2024  6:16 PM) Patient could not remember if this was one of the medications she took this morning  B Complex-C (B-COMPLEX WITH VITAMIN C) tablet [id number] Yes Take 1 tablet by mouth daily with lunch. [provider]  Active Self, Pharmacy Records  budesonide -formoterol  (SYMBICORT ) 160-4.5 MCG/ACT inhaler [id number] Yes INHALE 2 PUFFS BY MOUTH TWICE DAILY IN THE MORNING AND IN THE EVENING  Patient taking differently: Inhale 2 puffs into the lungs as needed. INHALE 2 PUFFS BY MOUTH TWICE DAILY IN THE MORNING AND IN THE KARNA Jarold Medici, MD  Active Self, Pharmacy Records  Calcium  Carb-Cholecalciferol 600-800 MG-UNIT TABS [id number] Yes Take 1 tablet by mouth daily with lunch. [provider]  Active Self, Pharmacy Records  Cholecalciferol (VITAMIN D  PO) [id number] Yes Take 2,000 Units by mouth daily with lunch. [provider]  Active Self, Pharmacy Records           Med Note (LITTLE, ANGEL L   Thu Feb 10, 2024  9:35 AM)  dapagliflozin  propanediol (FARXIGA ) 10 MG TABS tablet [id number] Yes Take 1 tablet (10 mg total) by mouth daily before breakfast. Jarold Medici, MD  Active Self, Pharmacy Records           Med Note [facility], ALFREIDA CROME   Tue Jul 04, 2024  6:10 PM) Patient stated she was informed to hold off today and she could have it later.  denosumab  (PROLIA ) 60 MG/ML SOSY injection [id number] Yes Inject 60 mg into the skin every 6 (six) months.  Courier to rheum: [address], [place] [address]. Appt on 12/07/2022 Dolphus Reiter, MD  Active Self, Pharmacy Records           Med Note EFRAIM, ALFREIDA CROME   Tue Jul 04, 2024  6:11 PM) Patient stated her next shot is in January  glucose blood (ACCU-CHEK GUIDE) test strip [id number] Yes USE TO CHECK BLOOD SUGAR DAILY Jarold Medici, MD  Active Self, Pharmacy Records  hydroxychloroquine  (PLAQUENIL ) 200 MG tablet [id number] Yes TAKE 1 TABLET BY MOUTH TWICE DAILY, MONDAY THROUGH FRIDAY ONLY Cheryl Waddell HERO, PA-C  Active Self, Pharmacy Records  irbesartan  (AVAPRO ) 300 MG tablet [id number] Yes TAKE 1 TABLET(300 MG) BY MOUTH DAILY Jarold Medici, MD  Active Self, Pharmacy Records  metoprolol  succinate (TOPROL -XL) 25 MG 24 hr tablet [id number] Yes Take 1 tablet (25 mg total) by mouth daily. Please call [phone] to schedule an appointment for future refills. Thank you. Nahser, Aleene PARAS, MD  Active Self, Pharmacy Records  Multiple Vitamin (MULTIVITAMIN WITH MINERALS) TABS tablet [id number] Yes Take 1 tablet by mouth daily with lunch. Women's Multivitamin [provider]  Active Self, Pharmacy Records  pantoprazole  (PROTONIX ) 40 MG tablet [id number] Yes Take 40 mg by mouth every morning. [provider]  Active Self, Pharmacy Records  Polyethyl Glycol-Propyl Glycol (SYSTANE) 0.4-0.3 % SOLN [id number] Yes Place 1-2 drops into both eyes 3 (three) times daily as needed (dry/irritated eyes.). [provider]  Active Self, Pharmacy Records  SYNTHROID  88 MCG tablet [id number] Yes TAKE 1 TABLET BY MOUTH EVERY DAY MONDAY TO SATURDAY AND OFF ON SUNDAYS Jarold Medici, MD  Active Self, Pharmacy Records            Home Care and Equipment/Supplies: Were Home Health Services Ordered?: NA Any new equipment or medical supplies ordered?: NA  Functional Questionnaire: Do you need assistance with bathing/showering or dressing?: No Do you need assistance with meal preparation?:  No Do you need assistance with eating?: No Do you have difficulty maintaining continence: No Do you need assistance with getting out of bed/getting out of a chair/moving?: No Do you have difficulty managing or taking your medications?: No  Follow up appointments reviewed: PCP Follow-up appointment confirmed?: Yes Date of PCP follow-up appointment?: 07/14/24 Follow-up Provider: [facility] Follow-up appointment confirmed?: Yes Date of Specialist follow-up appointment?: 08/14/24 Follow-Up Specialty Provider:: GI Do you need transportation to your follow-up appointment?: No Do you understand care options if your condition(s) worsen?: Yes-patient verbalized understanding    SIGNATURE Julian Lemmings, LPN [facility] Nurse Health Advisor Direct Dial [phone]

## 2024-07-07 ENCOUNTER — Telehealth: Payer: Self-pay

## 2024-07-07 ENCOUNTER — Ambulatory Visit (HOSPITAL_COMMUNITY): Admission: RE | Admit: 2024-07-07 | Source: Home / Self Care | Admitting: Gastroenterology

## 2024-07-07 LAB — SURGICAL PATHOLOGY

## 2024-07-07 SURGERY — ENTEROSCOPY
Anesthesia: Monitor Anesthesia Care

## 2024-07-07 NOTE — Telephone Encounter (Signed)
 PAP: Patient assistance application for synthroid  through [employer]) has been mailed to pt's home address on file. Provider portion of application will be faxed to provider's office.

## 2024-07-08 ENCOUNTER — Other Ambulatory Visit: Payer: Self-pay | Admitting: Physician Assistant

## 2024-07-08 ENCOUNTER — Other Ambulatory Visit: Payer: Self-pay | Admitting: Internal Medicine

## 2024-07-08 DIAGNOSIS — M0579 Rheumatoid arthritis with rheumatoid factor of multiple sites without organ or systems involvement: Secondary | ICD-10-CM

## 2024-07-10 ENCOUNTER — Other Ambulatory Visit: Payer: Self-pay | Admitting: Internal Medicine

## 2024-07-10 NOTE — Telephone Encounter (Signed)
 Last Fill: 03/16/2024  Eye exam: 02/16/2024   Labs: 07/05/2024 CBC: RBC 2.34, hemoglobin 7.3, HCT 23.7, MCV 101.3, RDW 16.5 CMP: creatinine 1.41, calcium  8.7, total protein 5.0, albumin 2.8, GFR 38.  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   Okay to refill Plaquenil ?

## 2024-07-11 ENCOUNTER — Telehealth: Payer: Self-pay | Admitting: Pharmacist

## 2024-07-11 DIAGNOSIS — E1122 Type 2 diabetes mellitus with diabetic chronic kidney disease: Secondary | ICD-10-CM

## 2024-07-11 MED ORDER — DAPAGLIFLOZIN PROPANEDIOL 10 MG PO TABS: 10.0000 mg | ORAL_TABLET | Freq: Every day | ORAL | 3 refills | Status: AC

## 2024-07-11 NOTE — Progress Notes (Signed)
   07/11/2024  Patient ID: Hannah [name], female   DOB: 19-Oct-1945, 78 y.o.   MRN: [id number]  Patient called and said she received a letter from [employer] stating they need a new prescription for Farxiga  sent to [employer].  HIPAA identifiers were obtained.  New prescription sent to [facility] required.   Cassius DOROTHA Brought, PharmD, BCACP Clinical Pharmacist [phone]

## 2024-07-12 MED ORDER — METOPROLOL SUCCINATE ER 25 MG PO TB24: 25.0000 mg | ORAL_TABLET | Freq: Every day | ORAL | 3 refills | Status: AC

## 2024-07-13 ENCOUNTER — Other Ambulatory Visit (HOSPITAL_COMMUNITY): Payer: Self-pay

## 2024-07-13 DIAGNOSIS — Z96651 Presence of right artificial knee joint: Secondary | ICD-10-CM | POA: Diagnosis not present

## 2024-07-13 DIAGNOSIS — M25661 Stiffness of right knee, not elsewhere classified: Secondary | ICD-10-CM | POA: Diagnosis not present

## 2024-07-13 NOTE — Telephone Encounter (Signed)
 Received provider portion PAP application [employer] for Synthroid .

## 2024-07-14 ENCOUNTER — Ambulatory Visit

## 2024-07-14 ENCOUNTER — Encounter: Payer: Self-pay | Admitting: Family Medicine

## 2024-07-14 ENCOUNTER — Ambulatory Visit: Payer: Self-pay | Admitting: Family Medicine

## 2024-07-14 VITALS — BP 120/60 | HR 68 | Temp 98.2°F | Ht 58.5 in | Wt 160.0 lb

## 2024-07-14 DIAGNOSIS — E6609 Other obesity due to excess calories: Secondary | ICD-10-CM

## 2024-07-14 DIAGNOSIS — E1122 Type 2 diabetes mellitus with diabetic chronic kidney disease: Secondary | ICD-10-CM | POA: Diagnosis not present

## 2024-07-14 DIAGNOSIS — Z87898 Personal history of other specified conditions: Secondary | ICD-10-CM

## 2024-07-14 DIAGNOSIS — M3219 Other organ or system involvement in systemic lupus erythematosus: Secondary | ICD-10-CM | POA: Diagnosis not present

## 2024-07-14 DIAGNOSIS — Z0000 Encounter for general adult medical examination without abnormal findings: Secondary | ICD-10-CM

## 2024-07-14 DIAGNOSIS — I480 Paroxysmal atrial fibrillation: Secondary | ICD-10-CM | POA: Diagnosis not present

## 2024-07-14 DIAGNOSIS — N1832 Chronic kidney disease, stage 3b: Secondary | ICD-10-CM | POA: Diagnosis not present

## 2024-07-14 DIAGNOSIS — E66811 Obesity, class 1: Secondary | ICD-10-CM | POA: Diagnosis not present

## 2024-07-14 DIAGNOSIS — Z6832 Body mass index (BMI) 32.0-32.9, adult: Secondary | ICD-10-CM

## 2024-07-14 NOTE — Progress Notes (Signed)
 Subjective:   Hannah [name] is a 78 y.o. female who presents for Medicare Annual (Subsequent) preventive examination.  Visit Complete: Virtual I connected with  Hannah [name] on 07/14/24 by a audio enabled telemedicine application and verified that I am speaking with the correct person using two identifiers.  Patient Location: Home  Provider Location: Home Office  I discussed the limitations of evaluation and management by telemedicine. The patient expressed understanding and agreed to proceed.  Vital Signs: Because this visit was a virtual/telehealth visit, some criteria may be missing or patient reported. Any vitals not documented were not able to be obtained and vitals that have been documented are patient reported.  Patient Medicare AWV questionnaire was completed by the patient on 07/14/24; I have confirmed that all information answered by patient is correct and no changes since this date.  Cardiac Risk Factors include: diabetes mellitus;hypertension     Objective:    Today's Vitals   07/14/24 1143  PainSc: 0-No pain   There is no height or weight on file to calculate BMI.     07/04/2024    7:21 AM 12/21/2023    8:14 AM 09/26/2023    7:22 AM 09/23/2023    3:03 PM 09/09/2023    1:58 PM 07/16/2023    3:59 PM 07/12/2023    8:36 AM  Advanced Directives  Does Patient Have a Medical Advance Directive? Yes Yes Yes Yes Yes No Yes  Type of Estate agent of [place];Living will Healthcare Power of [place];Living will  Healthcare Power of [place];Living will Healthcare Power of [place];Living will Healthcare Power of [employer] Power of Attorney  Does patient want to make changes to medical advance directive? No - Patient declined   No - Patient declined No - Patient declined  No - Patient declined  Copy of Healthcare Power of Attorney in Chart? No - copy requested   No - copy requested No - copy requested No - copy requested No - copy  requested  Would patient like information on creating a medical advance directive?      No - Patient declined     Current Medications (verified) Outpatient Encounter Medications as of 07/14/2024  Medication Sig   Accu-Chek Softclix Lancets lancets Use to check blood sugars daily E11.69   albuterol  (PROVENTIL  HFA;VENTOLIN  HFA) 108 (90 Base) MCG/ACT inhaler Inhale 2 puffs into the lungs every 6 (six) hours as needed for wheezing or shortness of breath.   Alcohol  Swabs (ALCOHOL  PADS) 70 % PADS Use as directed to check blood sugars 1 time per day dx: e11.22   allopurinol  (ZYLOPRIM ) 100 MG tablet TAKE 1 TABLET BY MOUTH 3 TIMES A WEEK   amLODipine  (NORVASC ) 5 MG tablet TAKE 1 TABLET(5 MG) BY MOUTH AT BEDTIME   apixaban  (ELIQUIS ) 5 MG TABS tablet Take 1 tablet (5 mg total) by mouth 2 (two) times daily.   Ascorbic Acid (VITAMIN C PO) Take 500 mg by mouth daily with lunch.   atorvastatin  (LIPITOR) 80 MG tablet TAKE 1 TABLET BY MOUTH EVERY DAY   B Complex-C (B-COMPLEX WITH VITAMIN C) tablet Take 1 tablet by mouth daily with lunch.   budesonide -formoterol  (SYMBICORT ) 160-4.5 MCG/ACT inhaler INHALE 2 PUFFS BY MOUTH TWICE DAILY IN THE MORNING AND IN THE EVENING (Patient taking differently: Inhale 2 puffs into the lungs as needed. INHALE 2 PUFFS BY MOUTH TWICE DAILY IN THE MORNING AND IN THE EVENING)   Calcium  Carb-Cholecalciferol 600-800 MG-UNIT TABS Take 1 tablet by mouth daily  with lunch.   Cholecalciferol (VITAMIN D  PO) Take 2,000 Units by mouth daily with lunch.   dapagliflozin  propanediol (FARXIGA ) 10 MG TABS tablet Take 1 tablet (10 mg total) by mouth daily before breakfast.   denosumab  (PROLIA ) 60 MG/ML SOSY injection Inject 60 mg into the skin every 6 (six) months. Courier to rheum: [address], [place] [address]. Appt on 12/07/2022   glucose blood (ACCU-CHEK GUIDE) test strip USE TO CHECK BLOOD SUGAR DAILY   hydroxychloroquine  (PLAQUENIL ) 200 MG tablet TAKE 1 TABLET BY MOUTH TWICE  DAILY, MONDAY THROUGH FRIDAY ONLY   irbesartan  (AVAPRO ) 300 MG tablet TAKE 1 TABLET(300 MG) BY MOUTH DAILY   metoprolol  succinate (TOPROL -XL) 25 MG 24 hr tablet Take 1 tablet (25 mg total) by mouth daily.   Multiple Vitamin (MULTIVITAMIN WITH MINERALS) TABS tablet Take 1 tablet by mouth daily with lunch. Women's Multivitamin   pantoprazole  (PROTONIX ) 40 MG tablet Take 40 mg by mouth every morning.   Polyethyl Glycol-Propyl Glycol (SYSTANE) 0.4-0.3 % SOLN Place 1-2 drops into both eyes 3 (three) times daily as needed (dry/irritated eyes.).   SYNTHROID  75 MCG tablet Take 1 tablet MONDAY through FRIDAY   SYNTHROID  88 MCG tablet TAKE 1 TABLET BY MOUTH EVERY SATURDAY AND SUNDAY   No facility-administered encounter medications on file as of 07/14/2024.    Allergies (verified) Shellfish allergy  and Shellfish protein-containing drug products   History: Past Medical History:  Diagnosis Date   Allergy  25 years ago.   Anemia    Arthritis    Asthma    Atrial fibrillation (HCC)    Cataract Had cataracts surgery 2015 & 2016   Chronic kidney disease    Diabetes mellitus without complication (HCC)    Gout 12/17/2014   patient reported   Heart murmur 4 years ago   Hyperlipidemia    Hypertension    Hypothyroidism    Systemic lupus erythematosus (HCC)    Ulcer    Vitiligo    Past Surgical History:  Procedure Laterality Date   BIOPSY  09/26/2023   Procedure: BIOPSY;  Surgeon: Aneita Gwendlyn DASEN, MD;  Location: [facility];  Service: Gastroenterology;;   CATARACT EXTRACTION Bilateral 2015   COLONOSCOPY WITH PROPOFOL  N/A 09/27/2023   Procedure: COLONOSCOPY WITH PROPOFOL ;  Surgeon: Kristie Lamprey, MD;  Location: [facility];  Service: Gastroenterology;  Laterality: N/A;   DILATION AND CURETTAGE OF UTERUS     DOPPLER ECHOCARDIOGRAPHY  05/2018   Internist to review with pt; potential heart murmur 06/20/18   ESOPHAGOGASTRODUODENOSCOPY (EGD) WITH PROPOFOL  N/A 09/26/2023   Procedure:  ESOPHAGOGASTRODUODENOSCOPY (EGD) WITH PROPOFOL ;  Surgeon: Aneita Gwendlyn DASEN, MD;  Location: [facility];  Service: Gastroenterology;  Laterality: N/A;   EYE SURGERY  Cataract Surgery, 2015 & 2016.   keratosis removal  2021   KNEE CLOSED REDUCTION Left 07/06/2022   Procedure: CLOSED MANIPULATION KNEE;  Surgeon: Melodi Lerner, MD;  Location: [facility];  Service: Orthopedics;  Laterality: Left;   KNEE CLOSED REDUCTION Right 09/13/2023   Procedure: CLOSED MANIPULATION KNEE;  Surgeon: Melodi Lerner, MD;  Location: [facility];  Service: Orthopedics;  Laterality: Right;   REPLACEMENT TOTAL KNEE Left    SKIN SURGERY  11/30/2018   left side of face   TOOTH EXTRACTION     TOTAL KNEE ARTHROPLASTY Left 02/02/2022   Procedure: TOTAL KNEE ARTHROPLASTY;  Surgeon: Melodi Lerner, MD;  Location: [facility];  Service: Orthopedics;  Laterality: Left;   TOTAL KNEE ARTHROPLASTY Right 07/12/2023   Procedure: RIGHT TOTAL KNEE ARTHROPLASTY;  Surgeon: Melodi Lerner, MD;  Location: [facility];  Service: Orthopedics;  Laterality: Right;   Family History  Problem Relation Age of Onset   Heart disease Father    Diabetes Father    Hypertension Mother    Obesity Mother    Varicose Veins Mother    Hypertension Sister    Hypertension Sister    Leukemia Sister    Cancer Sister    Obesity Sister    Social History   Socioeconomic History   Marital status: Single    Spouse name: Not on file   Number of children: 0   Years of education: Not on file   Highest education level: Bachelor's degree (e.g., BA, AB, BS)  Occupational History   Occupation: retired  Tobacco Use   Smoking status: Never    Passive exposure: Never   Smokeless tobacco: Never  Vaping Use   Vaping status: Never Used  Substance and Sexual Activity   Alcohol  use: Not Currently    Comment: Maybe a glass of wine every 3 months.   Drug use: No   Sexual activity: Not Currently    Birth control/protection: None    Comment: 1ST intercourse- 19 , partners-  6,   Other Topics Concern   Not on file  Social History Narrative   Not on file   Social Drivers of Health   Financial Resource Strain: Low Risk  (03/02/2024)   Overall Financial Resource Strain (CARDIA)    Difficulty of Paying Living Expenses: Not hard at all  Food Insecurity: No Food Insecurity (07/04/2024)   Hunger Vital Sign    Worried About Running Out of Food in the Last Year: Never true    Ran Out of Food in the Last Year: Never true  Transportation Needs: No Transportation Needs (07/04/2024)   PRAPARE - Administrator, Civil Service (Medical): No    Lack of Transportation (Non-Medical): No  Physical Activity: Sufficiently Active (02/10/2024)   Exercise Vital Sign    Days of Exercise per Week: 3 days    Minutes of Exercise per Session: 50 min  Stress: No Stress Concern Present (02/10/2024)   [employer] of Occupational Health - Occupational Stress Questionnaire    Feeling of Stress : Only a little  Social Connections: Unknown (07/04/2024)   Social Connection and Isolation Panel    Frequency of Communication with Friends and Family: More than three times a week    Frequency of Social Gatherings with Friends and Family: More than three times a week    Attends Religious Services: Not on Marketing executive or Organizations: Not on file    Attends Banker Meetings: Not on file    Marital Status: Not on file    Tobacco Counseling Counseling given: Not Answered   Clinical Intake:  Pre-visit preparation completed: Yes  Pain : No/denies pain Pain Score: 0-No pain     Nutritional Risks: None Diabetes: Yes CBG done?: No Did pt. bring in CBG monitor from home?: No  How often do you need to have someone help you when you read instructions, pamphlets, or other written materials from your doctor or pharmacy?: 1 - Never What is the last grade level you completed in school?: college degree  Interpreter Needed?: No  Information  entered by :: Gracia Saggese h   Activities of Daily Living    07/14/2024   11:44 AM 09/24/2023   10:00 AM  In your present state of health, do  you have any difficulty performing the following activities:  Hearing? 0 0  Vision? 0 0  Difficulty concentrating or making decisions? 0 0  Walking or climbing stairs? 1   Comment recent knee replacement   Dressing or bathing? 0   Doing errands, shopping?  0  Preparing Food and eating ? N   Using the Toilet? N   In the past six months, have you accidently leaked urine? N   Do you have problems with loss of bowel control? N   Managing your Medications? N   Managing your Finances? N   Housekeeping or managing your Housekeeping? N     Patient Care Team: Jarold Medici, MD as PCP - General (Internal Medicine) Kriste Emeline BRAVO, MD as PCP - Cardiology (Cardiology) Morgan Clayborne CROME, RN as [facility] Management Jolee Cassius PARAS, [facility] as Pharmacist (Pharmacist) Kriste Emeline BRAVO, MD (Cardiology)  Indicate any recent Medical Services you may have received from other than [facility] providers in the past year (date may be approximate).     Assessment:   This is a routine wellness examination for Lukisha.  Hearing/Vision screen No results found.   Goals Addressed   None    Depression Screen    05/26/2024   10:02 AM 03/17/2024    9:19 AM 03/15/2024    3:35 PM 02/10/2024    9:23 AM 02/10/2024    9:18 AM 01/12/2024   11:22 AM 12/21/2023    8:17 AM  PHQ 2/9 Scores  PHQ - 2 Score 0 0 0 0 0 0 0  PHQ- 9 Score   0        Fall Risk    07/14/2024   11:46 AM 06/30/2024    2:40 PM 06/01/2024   11:28 AM 03/17/2024    9:20 AM 03/15/2024    3:35 PM  Fall Risk   Falls in the past year? 0 0 0 0 0  Number falls in past yr: 0 0 1 0 0  Injury with Fall? 0 0 0 0 0  Risk for fall due to : No Fall Risks Impaired balance/gait;Orthopedic patient  Orthopedic patient;Impaired balance/gait;Impaired mobility No Fall Risks  Follow up Falls evaluation completed Education  provided;Falls evaluation completed  Falls evaluation completed;Education provided;Falls prevention discussed Falls evaluation completed    MEDICARE RISK AT HOME: Medicare Risk at Home Any stairs in or around the home?: No If so, are there any without handrails?: No Home free of loose throw rugs in walkways, pet beds, electrical cords, etc?: No Adequate lighting in your home to reduce risk of falls?: Yes Life alert?: Yes Use of a cane, walker or w/c?: No Grab bars in the bathroom?: Yes Shower chair or bench in shower?: No Elevated toilet seat or a handicapped toilet?: No  Cognitive Function:        07/14/2024   11:47 AM 06/30/2023    4:07 PM 06/27/2022    9:50 AM 06/25/2021   11:20 AM 06/19/2020   11:31 AM  6CIT Screen  What Year? 0 points 0 points 0 points 0 points 0 points  What month? 0 points 0 points 0 points 0 points 0 points  What time? 0 points 0 points 0 points 0 points 0 points  Count back from 20 0 points 0 points 0 points 0 points 0 points  Months in reverse 0 points 0 points 0 points 0 points 0 points  Repeat phrase 0 points 0 points 0 points 4 points 2 points  Total Score 0  points 0 points 0 points 4 points 2 points    Immunizations Immunization History  Administered Date(s) Administered   19-influenza Whole 07/15/2012   DTaP 06/18/2019   Fluad Quad(high Dose 65+) 06/19/2020, 06/03/2021   Fluad Trivalent(High Dose 65+) 09/25/2023   H1N1 08/09/2008   INFLUENZA, HIGH DOSE SEASONAL PF 05/18/2019, 06/04/2022   Influenza Split 07/27/2008, 06/28/2014   Influenza,inj,Quad PF,6+ Mos 06/20/2018   MMR 06/20/2010   Moderna Covid-19 Fall Seasonal Vaccine 52yrs & older 07/27/2022   Moderna SARS-COV2 Booster Vaccination 01/01/2021   Moderna Sars-Covid-2 Vaccination 11/20/2019, 12/19/2019, 08/06/2020, 01/01/2021, 08/13/2021   Pfizer Covid-19 Vaccine Bivalent Booster 71yrs & up 07/27/2022   Pneumococcal Conjugate-13 05/04/2018   Pneumococcal Polysaccharide-23 05/18/2019    Pneumococcal-Unspecified 06/28/2014   RSV,unspecified 10/22/2022   Tdap 06/15/2019   Tetanus 07/22/2005   Zoster Recombinant(Shingrix) 02/19/2021, 04/24/2021    TDAP status: Up to date  Flu Vaccine status: Up to date  Pneumococcal vaccine status: Up to date  Covid-19 vaccine status: Completed vaccines  Qualifies for Shingles Vaccine? Yes   Zostavax completed Yes   Shingrix Completed?: Yes  Screening Tests Health Maintenance  Topic Date Due   FOOT EXAM  01/01/2023   Influenza Vaccine  04/28/2024   Mammogram  05/11/2024   COVID-19 Vaccine (7 - 2025-26 season) 05/29/2024   Diabetic kidney evaluation - Urine ACR  06/03/2024   DEXA SCAN  09/03/2024   HEMOGLOBIN A1C  09/14/2024   OPHTHALMOLOGY EXAM  02/15/2025   Diabetic kidney evaluation - eGFR measurement  07/05/2025   Medicare Annual Wellness (AWV)  07/14/2025   DTaP/Tdap/Td (3 - Td or Tdap) 06/17/2029   Pneumococcal Vaccine: 50+ Years  Completed   Hepatitis C Screening  Completed   Zoster Vaccines- Shingrix  Completed   Meningococcal B Vaccine  Aged Out   Colonoscopy  Discontinued    Health Maintenance  Health Maintenance Due  Topic Date Due   FOOT EXAM  01/01/2023   Influenza Vaccine  04/28/2024   Mammogram  05/11/2024   COVID-19 Vaccine (7 - 2025-26 season) 05/29/2024   Diabetic kidney evaluation - Urine ACR  06/03/2024    Mammogram status: Completed 05/12/23. Repeat every year   Lung Cancer Screening: (Low Dose CT Chest recommended if Age 17-80 years, 20 pack-year currently smoking OR have quit w/in 15years.) does not qualify.   Lung Cancer Screening Referral: n/a  Additional Screening:  Hepatitis C Screening: does qualify; Completed 03/24/2017  Vision Screening: Recommended annual ophthalmology exams for early detection of glaucoma and other disorders of the eye. Is the patient up to date with their annual eye exam?  No  Who is the provider or what is the name of the office in which the patient  attends annual eye exams? N/a If pt is not established with a provider, would they like to be referred to a provider to establish care? No .   Dental Screening: Recommended annual dental exams for proper oral hygiene   Community Resource Referral / Chronic Care Management: CRR required this visit?  No   CCM required this visit?  No     Plan:     I have personally reviewed and noted the following in the patient's chart:   Medical and social history Use of alcohol , tobacco or illicit drugs  Current medications and supplements including opioid prescriptions. Patient is not currently taking opioid prescriptions. Functional ability and status Nutritional status Physical activity Advanced directives List of other physicians Hospitalizations, surgeries, and ER visits in previous 12 months Vitals  Screenings to include cognitive, depression, and falls Referrals and appointments  In addition, I have reviewed and discussed with patient certain preventive protocols, quality metrics, and best practice recommendations. A written personalized care plan for preventive services as well as general preventive health recommendations were provided to patient.     Richerd ONEIDA Lemmings, CMA   07/14/2024   After Visit Summary: (MyChart) Due to this being a telephonic visit, the after visit summary with patients personalized plan was offered to patient via MyChart   Nurse Notes: Richerd Lemmings, CMA

## 2024-07-14 NOTE — Progress Notes (Signed)
 I,Jameka J Llittleton, CMA,acting as a neurosurgeon for [employer], NP.,have documented all relevant documentation on the behalf of Bruna Creighton, NP,as directed by  Bruna Creighton, NP while in the presence of Bruna Creighton, NP.  Subjective:  Patient ID: Hannah [name] , female    DOB: July 25, 1946 , 78 y.o.   MRN: [id number]  Chief Complaint  Patient presents with   ER F/U    Patient presents today for a ER f/u. Patient reports she needs to have some bloodwork done to recheck her blood levels done.     HPI Discussed the use of AI scribe software for clinical note transcription with the patient, who gave verbal consent to proceed.  History of Present Illness   Hannah [name] is a 78 year old female with chronic kidney disease and atrial fibrillation who presents for a hospital follow-up after a kidney biopsy.  She was discharged from the hospital on July 05, 2024, following a kidney biopsy performed to investigate a possible mass identified on a renal ultrasound. Post-procedure, she experienced bleeding which required an overnight hospital stay. The bleeding resolved, and she was discharged the following day. She is currently awaiting the results of the kidney biopsy, which could take about a week to process.  She is under the care of a nephrologist and sees her approximately twice a year. She is on Eliquis  for atrial fibrillation and had to stop taking it two days prior to the biopsy. No current bleeding in her urine or stool. No recent episodes of atrial fibrillation and she is currently asymptomatic.  She has a history of anemia and receives iron  infusions twice a year, with the most recent infusion occurring within the last two months. She is scheduled for blood work on August 01, 2024, with results to be communicated by phone on August 07, 2024.  She has lupus, osteoarthritis, and rheumatoid arthritis, and is under the care of a rheumatologist. She reports that she has not experienced any  lupus attacks or symptoms and is unaware of any organ involvement. She takes Plaquenil  daily and receives Prolia  injections every six months.  She underwent a right knee replacement approximately one year ago and was recently discharged from follow-up care after a one-year checkup. She plans to resume exercise, specifically Silver Sneakers, to improve knee function.  She has an upcoming endoscopic procedure scheduled for August 11, 2024, which was rescheduled to allow recovery time post-biopsy. She is also scheduled for an echocardiogram in November to monitor her heart condition.   Hannah [name] is a 78 year old female with chronic kidney disease and atrial fibrillation who presents for a hospital follow-up after a kidney biopsy.  She was discharged from the hospital on July 05, 2024, following a kidney biopsy performed to investigate a possible mass identified on a renal ultrasound. Post-procedure, she experienced bleeding which required an overnight hospital stay. The bleeding resolved, and she was discharged the following day. She is currently awaiting the results of the kidney biopsy, which could take about a week to process.  She is under the care of a nephrologist and sees her approximately twice a year. She is on Eliquis  for atrial fibrillation and had to stop taking it two days prior to the biopsy. No current bleeding in her urine or stool. No recent episodes of atrial fibrillation and she is currently asymptomatic.  She has a history of anemia and receives iron  infusions twice a year, with the most recent infusion occurring within the last  two months. She is scheduled for blood work on August 01, 2024, with results to be communicated by phone on August 07, 2024.  She has lupus, osteoarthritis, and rheumatoid arthritis, and is under the care of a rheumatologist. She reports that she has not experienced any lupus attacks or symptoms and is unaware of any organ involvement. She takes  Plaquenil  daily and receives Prolia  injections every six months.  She underwent a right knee replacement approximately one year ago and was recently discharged from follow-up care after a one-year checkup. She plans to resume exercise, specifically Silver Sneakers, to improve knee function. She is encouraged to strive for BMI less than 30 to decrease cardiac risk. Advised to aim for at least 150 minutes of exercise per week.   She has an upcoming endoscopic procedure scheduled for August 11, 2024, which was rescheduled to allow recovery time post-biopsy. She is also scheduled for an echocardiogram in November to monitor her heart condition.         Past Medical History:  Diagnosis Date   Allergy  25 years ago.   Anemia    Arthritis    Asthma    Atrial fibrillation (HCC)    Cataract Had cataracts surgery 2015 & 2016   Chronic kidney disease    Diabetes mellitus without complication (HCC)    Gout 12/17/2014   patient reported   Heart murmur 4 years ago   Hyperlipidemia    Hypertension    Hypothyroidism    Systemic lupus erythematosus (HCC)    Ulcer    Vitiligo      Family History  Problem Relation Age of Onset   Heart disease Father    Diabetes Father    Hypertension Mother    Obesity Mother    Varicose Veins Mother    Hypertension Sister    Hypertension Sister    Leukemia Sister    Cancer Sister    Obesity Sister      Current Outpatient Medications:    Accu-Chek Softclix Lancets lancets, Use to check blood sugars daily E11.69, Disp: 100 each, Rfl: 2   albuterol  (PROVENTIL  HFA;VENTOLIN  HFA) 108 (90 Base) MCG/ACT inhaler, Inhale 2 puffs into the lungs every 6 (six) hours as needed for wheezing or shortness of breath., Disp: 1 Inhaler, Rfl: 2   Alcohol  Swabs (ALCOHOL  PADS) 70 % PADS, Use as directed to check blood sugars 1 time per day dx: e11.22, Disp: 150 each, Rfl: 2   allopurinol  (ZYLOPRIM ) 100 MG tablet, TAKE 1 TABLET BY MOUTH 3 TIMES A WEEK, Disp: 90 tablet, Rfl:  2   amLODipine  (NORVASC ) 5 MG tablet, TAKE 1 TABLET(5 MG) BY MOUTH AT BEDTIME, Disp: 90 tablet, Rfl: 2   apixaban  (ELIQUIS ) 5 MG TABS tablet, Take 1 tablet (5 mg total) by mouth 2 (two) times daily., Disp: 60 tablet, Rfl: 11   Ascorbic Acid (VITAMIN C PO), Take 500 mg by mouth daily with lunch., Disp: , Rfl:    atorvastatin  (LIPITOR) 80 MG tablet, TAKE 1 TABLET BY MOUTH EVERY DAY, Disp: 90 tablet, Rfl: 2   B Complex-C (B-COMPLEX WITH VITAMIN C) tablet, Take 1 tablet by mouth daily with lunch., Disp: , Rfl:    budesonide -formoterol  (SYMBICORT ) 160-4.5 MCG/ACT inhaler, INHALE 2 PUFFS BY MOUTH TWICE DAILY IN THE MORNING AND IN THE EVENING (Patient taking differently: Inhale 2 puffs into the lungs as needed. INHALE 2 PUFFS BY MOUTH TWICE DAILY IN THE MORNING AND IN THE EVENING), Disp: 10.2 g, Rfl: 6   Calcium  Carb-Cholecalciferol  600-800 MG-UNIT TABS, Take 1 tablet by mouth daily with lunch., Disp: , Rfl:    Cholecalciferol (VITAMIN D  PO), Take 2,000 Units by mouth daily with lunch., Disp: , Rfl:    dapagliflozin  propanediol (FARXIGA ) 10 MG TABS tablet, Take 1 tablet (10 mg total) by mouth daily before breakfast., Disp: 90 tablet, Rfl: 3   denosumab  (PROLIA ) 60 MG/ML SOSY injection, Inject 60 mg into the skin every 6 (six) months. Courier to rheum: [address], [address], [place] [address]. Appt on 12/07/2022, Disp: 1 mL, Rfl: 0   glucose blood (ACCU-CHEK GUIDE) test strip, USE TO CHECK BLOOD SUGAR DAILY, Disp: 100 strip, Rfl: 2   hydroxychloroquine  (PLAQUENIL ) 200 MG tablet, TAKE 1 TABLET BY MOUTH TWICE DAILY, MONDAY THROUGH FRIDAY ONLY, Disp: 120 tablet, Rfl: 0   irbesartan  (AVAPRO ) 300 MG tablet, TAKE 1 TABLET(300 MG) BY MOUTH DAILY, Disp: 90 tablet, Rfl: 2   metoprolol  succinate (TOPROL -XL) 25 MG 24 hr tablet, Take 1 tablet (25 mg total) by mouth daily., Disp: 90 tablet, Rfl: 3   Multiple Vitamin (MULTIVITAMIN WITH MINERALS) TABS tablet, Take 1 tablet by mouth daily with lunch. Women's  Multivitamin, Disp: , Rfl:    pantoprazole  (PROTONIX ) 40 MG tablet, Take 40 mg by mouth every morning., Disp: , Rfl:    Polyethyl Glycol-Propyl Glycol (SYSTANE) 0.4-0.3 % SOLN, Place 1-2 drops into both eyes 3 (three) times daily as needed (dry/irritated eyes.)., Disp: , Rfl:    SYNTHROID  75 MCG tablet, Take 1 tablet MONDAY through FRIDAY, Disp: 90 tablet, Rfl: 2   SYNTHROID  88 MCG tablet, TAKE 1 TABLET BY MOUTH EVERY SATURDAY AND SUNDAY, Disp: 90 tablet, Rfl: 2   Allergies  Allergen Reactions   Shellfish Allergy  Anaphylaxis, Hives and Other (See Comments)   Shellfish Protein-Containing Drug Products Anaphylaxis and Other (See Comments)     Review of Systems  HENT: Negative.    Respiratory: Negative.    Cardiovascular:  Negative for chest pain, palpitations and leg swelling.  Musculoskeletal: Negative.   Neurological:  Negative for dizziness and light-headedness.  Hematological:  Bruises/bleeds easily.  Psychiatric/Behavioral: Negative.       Today's Vitals   07/14/24 0902  BP: 120/60  Pulse: 68  Temp: 98.2 F (36.8 C)  TempSrc: Oral  Weight: 160 lb (72.6 kg)  Height: 4' 10.5 (1.486 m)  PainSc: 0-No pain   Body mass index is 32.87 kg/m.  Wt Readings from Last 3 Encounters:  07/14/24 160 lb (72.6 kg)  07/05/24 162 lb (73.5 kg)  07/04/24 160 lb (72.6 kg)    The 10-year ASCVD risk score (Arnett DK, et al., 2019) is: 31.8%   Values used to calculate the score:     Age: 52 years     Clincally relevant sex: Female     Is Non-Hispanic African American: Yes     Diabetic: Yes     Tobacco smoker: No     Systolic Blood Pressure: 147 mmHg     Is BP treated: Yes     HDL Cholesterol: 58 mg/dL     Total Cholesterol: 131 mg/dL  Objective:  Physical Exam Constitutional:      Appearance: Normal appearance.  HENT:     Head: Normocephalic.  Cardiovascular:     Rate and Rhythm: Normal rate and regular rhythm.     Pulses: Normal pulses.     Heart sounds: Normal heart sounds.   Pulmonary:     Effort: Pulmonary effort is normal.     Breath sounds:  Normal breath sounds.  Abdominal:     General: Bowel sounds are normal.  Neurological:     Mental Status: She is alert.         Assessment And Plan:  Type 2 diabetes mellitus with stage 3b chronic kidney disease, without long-term current use of insulin  (HCC) Assessment & Plan: Chronic. Continue current treatment of Farxiga  daily.  Orders: -     Basic metabolic panel with GFR -     CBC  Paroxysmal atrial fibrillation (HCC) Assessment & Plan: Managed with Eliquis . No recent episodes. - Echocardiogram scheduled in November.   History of gross hematuria Assessment & Plan: Post-procedure bleeding resolved. Awaiting biopsy results. - Order BMP and CBC to assess for blood loss.   Other systemic lupus erythematosus with other organ involvement [facility]) Assessment & Plan: Chronic.No recent flares or organ involvement.   Class 1 obesity due to excess calories with serious comorbidity and body mass index (BMI) of 32.0 to 32.9 in adult Assessment & Plan: She is encouraged to strive for BMI less than 30 to decrease cardiac risk. Advised to aim for at least 150 minutes of exercise per week.     Assessment & Plan Hospital Follow-up after Kidney Biopsy for Chronic Kidney Disease  Anemia Chronic anemia managed with biannual iron  infusions. Recent infusion two months ago. - Follow-up blood work scheduled at cancer center on November 4th. Discuss results on November 10th.  Atrial Fibrillation   Systemic Lupus Erythematosus   Rheumatoid Arthritis and Osteoarthritis Diagnosed with both conditions.  History of Right Knee Replacement - Continue exercises to improve knee extension.    Return if symptoms worsen or fail to improve, for keep next appt.  Patient was given opportunity to ask questions. Patient verbalized understanding of the plan and was able to repeat key elements of the plan. All questions were  answered to their satisfaction.    I, Bruna Creighton, NP, have reviewed all documentation for this visit. The documentation on 07/28/2024 for the exam, diagnosis, procedures, and orders are all accurate and complete.   IF YOU HAVE BEEN REFERRED TO A SPECIALIST, IT MAY TAKE 1-2 WEEKS TO SCHEDULE/PROCESS THE REFERRAL. IF YOU HAVE NOT HEARD FROM US /SPECIALIST IN TWO WEEKS, PLEASE GIVE US  A CALL AT [phone].

## 2024-07-14 NOTE — Patient Instructions (Signed)

## 2024-07-15 LAB — BASIC METABOLIC PANEL WITH GFR
BUN/Creatinine Ratio: 14 (ref 12–28)
BUN: 23 mg/dL (ref 8–27)
CO2: 26 mmol/L (ref 20–29)
Calcium: 9.7 mg/dL (ref 8.7–10.3)
Chloride: 102 mmol/L (ref 96–106)
Creatinine, Ser: 1.66 mg/dL — ABNORMAL HIGH (ref 0.57–1.00)
Glucose: 102 mg/dL — ABNORMAL HIGH (ref 70–99)
Potassium: 4.2 mmol/L (ref 3.5–5.2)
Sodium: 142 mmol/L (ref 134–144)
eGFR: 31 mL/min/1.73 — ABNORMAL LOW (ref >59)

## 2024-07-15 LAB — CBC
Hematocrit: 28 % — ABNORMAL LOW (ref 34.0–46.6)
Hemoglobin: 8.8 g/dL — ABNORMAL LOW (ref 11.1–15.9)
MCH: 31.7 pg (ref 26.6–33.0)
MCHC: 31.4 g/dL — ABNORMAL LOW (ref 31.5–35.7)
MCV: 101 fL — ABNORMAL HIGH (ref 79–97)
Platelets: 203 x10E3/uL (ref 150–450)
RBC: 2.78 x10E6/uL — ABNORMAL LOW (ref 3.77–5.28)
RDW: 14.5 % (ref 11.7–15.4)
WBC: 5.5 x10E3/uL (ref 3.4–10.8)

## 2024-07-21 ENCOUNTER — Telehealth

## 2024-07-25 ENCOUNTER — Other Ambulatory Visit: Payer: Self-pay | Admitting: Pharmacist

## 2024-07-25 ENCOUNTER — Encounter: Payer: Self-pay | Admitting: Pharmacist

## 2024-07-25 ENCOUNTER — Other Ambulatory Visit: Payer: Self-pay

## 2024-07-25 DIAGNOSIS — M3219 Other organ or system involvement in systemic lupus erythematosus: Secondary | ICD-10-CM

## 2024-07-25 DIAGNOSIS — Z79899 Other long term (current) drug therapy: Secondary | ICD-10-CM

## 2024-07-25 DIAGNOSIS — M0579 Rheumatoid arthritis with rheumatoid factor of multiple sites without organ or systems involvement: Secondary | ICD-10-CM

## 2024-07-25 DIAGNOSIS — M810 Age-related osteoporosis without current pathological fracture: Secondary | ICD-10-CM

## 2024-07-25 NOTE — Patient Instructions (Signed)
 Visit Information  Thank you for taking time to visit with me today. Please don't hesitate to contact me if I can be of assistance to you before our next scheduled appointment.  Your next care management appointment is by telephone on Wednesday, November 19 at 09:30 AM  Please call the care guide team at [phone] if you need to cancel, schedule, or reschedule an appointment.   Please call [phone]-TALK (toll free, 24 hour hotline) if you are experiencing a Mental Health or Behavioral Health Crisis or need someone to talk to.  Clayborne Ly RN BSN CCM [place]  [facility], [facility] Nurse Care Coordinator  Direct Dial: [phone] Website: [email or web address]

## 2024-07-25 NOTE — Progress Notes (Signed)
 Next denosumab  due on 07/16/24. Referral placed to [facility] Day [phone]) - [place] Diagnosis: age-related osteoporosis  Dose: 60 mg SQ every 6 months  Last Clinic Visit: 05/11/2024 Next Clinic Visit: 10/26/2024  Last denosumab  dose: 12/15/2023  Labs: BMP on 07/14/24, calcium  normalized Last DEXA: 09/03/2022 Next DEXA due: Dec 2025 (order placed - MyChart message sent.  Orders placed for Prolia  x 1 dose. No premedicatons required.   Infusion center will reach out to schedule once benefits are verified.   Sherry Pennant, PharmD, MPH, BCPS, CPP Clinical Pharmacist [facility])

## 2024-07-25 NOTE — Patient Outreach (Signed)
 Complex Care Management   Visit Note  07/25/2024  Name:  Hannah [name] MRN: [id number] DOB: 1945/11/23  Situation: Referral received for Complex Care Management related to RA, OA, SLE, HTN, Hypothyroidism, CKD, s/p right TKA, Iron  deficiency Anemia. I obtained verbal consent from Patient.  Visit completed with Patient on the phone.  Background:   Past Medical History:  Diagnosis Date   Allergy  25 years ago.   Anemia    Arthritis    Asthma    Atrial fibrillation (HCC)    Cataract Had cataracts surgery 2015 & 2016   Chronic kidney disease    Diabetes mellitus without complication (HCC)    Gout 12/17/2014   patient reported   Heart murmur 4 years ago   Hyperlipidemia    Hypertension    Hypothyroidism    Systemic lupus erythematosus (HCC)    Ulcer    Vitiligo     Assessment: Patient Reported Symptoms:  Cognitive Cognitive Status: Alert and oriented to person, place, and time, Normal speech and language skills Cognitive/Intellectual Conditions Management [RPT]: None reported or documented in medical history or problem list   Health Maintenance Behaviors: Annual physical exam, Healthy diet, Immunizations, Sleep adequate Health Facilitated by: Healthy diet, Rest  Neurological Neurological Review of Symptoms: No symptoms reported    HEENT HEENT Symptoms Reported: No symptoms reported      Cardiovascular Cardiovascular Symptoms Reported: Fatigue Does patient have uncontrolled Hypertension?: No Cardiovascular Management Strategies: Medication therapy, Routine screening Cardiovascular Self-Management Outcome: 4 (good)  Respiratory Respiratory Symptoms Reported: No symptoms reported    Endocrine Endocrine Symptoms Reported: No symptoms reported Is patient diabetic?: No    Gastrointestinal Gastrointestinal Symptoms Reported: Bleeding Additional Gastrointestinal Details: worsening Anemia, patient is scheduled for an Enteroscopy on 08/11/24 Gastrointestinal Management  Strategies: Medication therapy (Routine Screening) Gastrointestinal Self-Management Outcome: 3 (uncertain)    Genitourinary Genitourinary Symptoms Reported: No symptoms reported    Integumentary Integumentary Symptoms Reported: No symptoms reported    Musculoskeletal Musculoskelatal Symptoms Reviewed: Limited mobility Additional Musculoskeletal Details: s/p right TKR; using a cane intermittently if needed Musculoskeletal Management Strategies: Adequate rest, Routine screening, Medical device Musculoskeletal Self-Management Outcome: 4 (good) Falls in the past year?: No Number of falls in past year: 1 or less Was there an injury with Fall?: No Fall Risk Category Calculator: 0 Patient Fall Risk Level: Low Fall Risk    Psychosocial Psychosocial Symptoms Reported: Alteration in sleep habits Behavioral Management Strategies: Adequate rest Behavioral Health Self-Management Outcome: 3 (uncertain) Major Change/Loss/Stressor/Fears (CP): Medical condition, self Behaviors When Feeling Stressed/Fearful: patient is awaiting renal biopsy results Techniques to Cope with Loss/Stress/Change: Diversional activities      07/25/2024    PHQ2-9 Depression Screening   Hannah [name] interest or pleasure in doing things Not at all  Feeling down, depressed, or hopeless Not at all  PHQ-2 - Total Score 0  Trouble falling or staying asleep, or sleeping too much    Feeling tired or having Hannah [name] energy    Poor appetite or overeating     Feeling bad about yourself - or that you are a failure or have let yourself or your family down    Trouble concentrating on things, such as reading the newspaper or watching television    Moving or speaking so slowly that other people could have noticed.  Or the opposite - being so fidgety or restless that you have been moving around a lot more than usual    Thoughts that you would be better off dead, or hurting yourself  in some way    PHQ2-9 Total Score    If you checked off any  problems, how difficult have these problems made it for you to do your work, take care of things at home, or get along with other people    Depression Interventions/Treatment      There were no vitals filed for this visit.  Medications Reviewed Today     Reviewed by Hannah Clayborne CROME, RN (Registered Nurse) on 07/25/24 at 1013  Med List Status: <None>   Medication Order Taking? Sig Documenting Provider Last Dose Status Informant  Accu-Chek Softclix Lancets lancets [id number]  Use to check blood sugars daily E11.69 Hannah Medici, MD  Active Self, Pharmacy Records  albuterol  (PROVENTIL  HFA;VENTOLIN  HFA) 108 (90 Base) MCG/ACT inhaler [id number]  Inhale 2 puffs into the lungs every 6 (six) hours as needed for wheezing or shortness of breath. Georgina Speaks, FNP  Active Self, Pharmacy Records           Med Note (Hannah [name], CLAYBORNE [name]   Thu Feb 10, 2024  9:36 AM)    Alcohol  Swabs (ALCOHOL  PADS) 70 % PADS [id number]  Use as directed to check blood sugars 1 time per day dx: e11.22 Hannah Medici, MD  Active Self, Pharmacy Records  allopurinol  (ZYLOPRIM ) 100 MG tablet [id number]  TAKE 1 TABLET BY MOUTH 3 TIMES A WEEK Sanders, Robyn, MD  Active Self, Pharmacy Records  amLODipine  (NORVASC ) 5 MG tablet [id number]  TAKE 1 TABLET(5 MG) BY MOUTH AT BEDTIME Hannah Medici, MD  Active Self, Pharmacy Records  apixaban  (ELIQUIS ) 5 MG TABS tablet [id number]  Take 1 tablet (5 mg total) by mouth 2 (two) times daily. Nahser, Aleene PARAS, MD  Active Self, Pharmacy Records  Ascorbic Acid (VITAMIN C PO) [id number]  Take 500 mg by mouth daily with lunch. [provider]  Active Self, Pharmacy Records  atorvastatin  (LIPITOR) 80 MG tablet [id number]  TAKE 1 TABLET BY MOUTH EVERY DAY Hannah Medici, MD  Active   B Complex-C (B-COMPLEX WITH VITAMIN C) tablet [id number]  Take 1 tablet by mouth daily with lunch. [provider]  Active Self, Pharmacy Records  budesonide -formoterol  (SYMBICORT ) 160-4.5 MCG/ACT inhaler [id number]   INHALE 2 PUFFS BY MOUTH TWICE DAILY IN THE MORNING AND IN THE EVENING  Patient taking differently: Inhale 2 puffs into the lungs as needed. INHALE 2 PUFFS BY MOUTH TWICE DAILY IN THE MORNING AND IN THE KARNA Hannah Medici, MD  Active Self, Pharmacy Records  Calcium  Carb-Cholecalciferol 600-800 MG-UNIT TABS [id number]  Take 1 tablet by mouth daily with lunch. [provider]  Active Self, Pharmacy Records  Cholecalciferol (VITAMIN D  PO) [id number]  Take 2,000 Units by mouth daily with lunch. [provider]  Active Self, Pharmacy Records           Med Note (Hannah [name]   Thu Feb 10, 2024  9:35 AM)    dapagliflozin  propanediol (FARXIGA ) 10 MG TABS tablet [id number]  Take 1 tablet (10 mg total) by mouth daily before breakfast. Hannah Medici, MD  Active   denosumab  (PROLIA ) 60 MG/ML SOSY injection [id number]  Inject 60 mg into the skin every 6 (six) months. Courier to rheum: [address], [place] [address]. Appt on 12/07/2022 Hannah Reiter, MD  Expired 07/14/24 2359 Self, Pharmacy Records           Med Note Hannah [name], Hannah [name]   Tue Jul 04, 2024  6:11 PM) Patient stated her next shot is in January  glucose blood (ACCU-CHEK GUIDE) test strip [id number]  USE TO CHECK BLOOD SUGAR DAILY Hannah Medici, MD  Active Self, Pharmacy Records  hydroxychloroquine  (PLAQUENIL ) 200 MG tablet [id number]  TAKE 1 TABLET BY MOUTH TWICE DAILY, MONDAY THROUGH FRIDAY ONLY Hannah [name], Maya, MD  Active   irbesartan  (AVAPRO ) 300 MG tablet [id number]  TAKE 1 TABLET(300 MG) BY MOUTH DAILY Hannah Medici, MD  Active Self, Pharmacy Records  metoprolol  succinate (TOPROL -XL) 25 MG 24 hr tablet [id number]  Take 1 tablet (25 mg total) by mouth daily. Hannah [name], Hannah E, MD  Active   Multiple Vitamin (MULTIVITAMIN WITH MINERALS) TABS tablet [id number]  Take 1 tablet by mouth daily with lunch. Women's Multivitamin [provider]  Active Self, Pharmacy Records  pantoprazole  (PROTONIX ) 40 MG  tablet [id number]  Take 40 mg by mouth every morning. [provider]  Active Self, Pharmacy Records  Polyethyl Glycol-Propyl Glycol (SYSTANE) 0.4-0.3 % SOLN [id number]  Place 1-2 drops into both eyes 3 (three) times daily as needed (dry/irritated eyes.). [provider]  Active Self, Pharmacy Records  SYNTHROID  75 MCG tablet [id number]  Take 1 tablet MONDAY through FRIDAY Sanders, Robyn, MD  Active   SYNTHROID  88 MCG tablet [id number]  TAKE 1 TABLET BY MOUTH EVERY SATURDAY AND SUNDAY Hannah Medici, MD  Active             Recommendation:   PCP Follow-up Specialty provider follow-up with Dr. Dennise, Nephrologist, and Dr. Onesimo, Hematologist as directed  Keep your scheduled appointment for your Echocardiogram scheduled for 08/14/24 at 09:35 AM  ECHOCARDIOGRAM [[id number]] Copay: $0.00   Provider: [facility] Department: [facility] ECHO    Follow Up Plan:   Telephone follow up appointment date/time:  Wednesday, November 19 at 09:30 AM  Clayborne Ly RN BSN CCM [place]  [facility], [facility] Nurse Care Coordinator  Direct Dial: [phone] Website: [email or web address]

## 2024-07-25 NOTE — Telephone Encounter (Signed)
 Spoke with patient about needing Prolia . Patient cannot afford Rx price of $435 to get in office, she plans to call [facility] and see what her medical price would be to get at infusion center and call us  back to discuss. At this point options are to continue paying for it every 6 months but otherwise kidney function makes her ineligible for other options.

## 2024-07-25 NOTE — Telephone Encounter (Signed)
 Per discussion w/ [employer] patient stated Catastrophic Stage 3 - met $2000 deductible, $0 copay for hospital infusion center. Referral/Order placed by [facility] 10/28. Patient aware will receive call and should schedule before end of year.

## 2024-07-26 ENCOUNTER — Telehealth: Payer: Self-pay | Admitting: Pharmacist

## 2024-07-26 ENCOUNTER — Telehealth: Payer: Self-pay | Admitting: Rheumatology

## 2024-07-26 ENCOUNTER — Telehealth (HOSPITAL_COMMUNITY): Payer: Self-pay

## 2024-07-26 DIAGNOSIS — E1165 Type 2 diabetes mellitus with hyperglycemia: Secondary | ICD-10-CM

## 2024-07-26 NOTE — Progress Notes (Addendum)
   07/26/2024  Patient ID: Hannah [name], female   DOB: [birth year], 78 y.o.   MRN: [id number]  Received the Provider portion of Hannah [name]' AZ&Me application from [name], CPhT.  When I spoke with the Patient last week, she shared that she had already completed the entire process to receive Farxiga  for 2026.  AZ&Me was called. The representative confirmed Patient is approved to receive Farxiga  until 09/27/2025.  We still need to send a signed Provider portion.   Lab Results  Component Value Date   HGBA1C 5.1 03/15/2024   HGBA1C 4.8 09/13/2023   HGBA1C 5.4 07/05/2023    Provider part of application--signatures gathered and faxed back to [name], CPhT for processing and scanning.  Cassius DOROTHA Brought, PharmD, BCACP Clinical Pharmacist [phone]

## 2024-07-26 NOTE — Telephone Encounter (Addendum)
 Auth Submission: APPROVED Site of care: Site of care: [facility] Payer: [employer] Medication & CPT/J Code(s) submitted: Prolia  (Denosumab ) [id number] Diagnosis Code: M81.0 Route of submission (phone, fax, portal): portal Phone # Fax # Auth type: Buy/Bill HB Units/visits requested: 60mg  [id number] Authorization number: [id number] Approval from: 05/25/23 to 09/27/25   Approval letter scanned into media tab in patient's chart.

## 2024-07-26 NOTE — Telephone Encounter (Signed)
 Patient left a voicemail requesting a referral be sent to [facility] so she can schedule her mammogram.

## 2024-07-26 NOTE — Telephone Encounter (Signed)
 Reached out to patient. She requested the order for bone density scan to be sent to [facility]. Patient advised we did fax it yesterday but I would re-fax it to make sure they have it.

## 2024-07-27 NOTE — Telephone Encounter (Signed)
 Received provider portion PAP application for Farxiga  ([employer])

## 2024-07-29 ENCOUNTER — Ambulatory Visit: Payer: Self-pay | Admitting: Family Medicine

## 2024-07-29 DIAGNOSIS — E66811 Obesity, class 1: Secondary | ICD-10-CM | POA: Insufficient documentation

## 2024-07-29 NOTE — Assessment & Plan Note (Signed)
 Chronic. Continue current treatment of Farxiga  daily.

## 2024-07-29 NOTE — Assessment & Plan Note (Signed)
 She is encouraged to strive for BMI less than 30 to decrease cardiac risk. Advised to aim for at least 150 minutes of exercise per week.

## 2024-07-29 NOTE — Assessment & Plan Note (Signed)
 Managed with Eliquis . No recent episodes. - Echocardiogram scheduled in November.

## 2024-07-29 NOTE — Assessment & Plan Note (Signed)
 Post-procedure bleeding resolved. Awaiting biopsy results. - Order BMP and CBC to assess for blood loss.

## 2024-07-29 NOTE — Assessment & Plan Note (Addendum)
 Chronic.No recent flares or organ involvement.

## 2024-07-29 NOTE — Progress Notes (Signed)
 Iron  levels have improved from when it was done in the hospital. Kidney function is stable  Thanks! Iron  levels have improved from when it was done in the hospital. Kidney function is stable  Thanks!

## 2024-07-31 ENCOUNTER — Telehealth: Payer: Self-pay | Admitting: Rheumatology

## 2024-07-31 ENCOUNTER — Other Ambulatory Visit: Payer: Self-pay

## 2024-07-31 ENCOUNTER — Other Ambulatory Visit (HOSPITAL_COMMUNITY): Payer: Self-pay

## 2024-07-31 DIAGNOSIS — D508 Other iron deficiency anemias: Secondary | ICD-10-CM

## 2024-07-31 DIAGNOSIS — D472 Monoclonal gammopathy: Secondary | ICD-10-CM

## 2024-07-31 NOTE — Telephone Encounter (Signed)
 Pt called stating [name] never received her referral. Pt is requesting a referral be sent again to [facility] so she can schedule her mammogram. Pt would like to have her scan done before the end of the year.

## 2024-07-31 NOTE — Telephone Encounter (Signed)
 PAP: Application for Synthroid has been submitted to [employer]), via fax

## 2024-07-31 NOTE — Telephone Encounter (Signed)
 Advised patient that I have re-faxed DEXA order to [facility]. Advised patient that PCP would be the one to send the mammogram order. Patient verbalized understanding.

## 2024-08-01 ENCOUNTER — Inpatient Hospital Stay: Attending: Hematology

## 2024-08-01 ENCOUNTER — Telehealth: Payer: Self-pay | Admitting: Rheumatology

## 2024-08-01 DIAGNOSIS — E785 Hyperlipidemia, unspecified: Secondary | ICD-10-CM | POA: Insufficient documentation

## 2024-08-01 DIAGNOSIS — Z79899 Other long term (current) drug therapy: Secondary | ICD-10-CM | POA: Diagnosis not present

## 2024-08-01 DIAGNOSIS — M3214 Glomerular disease in systemic lupus erythematosus: Secondary | ICD-10-CM | POA: Insufficient documentation

## 2024-08-01 DIAGNOSIS — S37012A Minor contusion of left kidney, initial encounter: Secondary | ICD-10-CM | POA: Diagnosis not present

## 2024-08-01 DIAGNOSIS — D472 Monoclonal gammopathy: Secondary | ICD-10-CM | POA: Diagnosis not present

## 2024-08-01 DIAGNOSIS — N3289 Other specified disorders of bladder: Secondary | ICD-10-CM | POA: Diagnosis not present

## 2024-08-01 DIAGNOSIS — Z91013 Allergy to seafood: Secondary | ICD-10-CM | POA: Diagnosis not present

## 2024-08-01 DIAGNOSIS — N2889 Other specified disorders of kidney and ureter: Secondary | ICD-10-CM | POA: Diagnosis not present

## 2024-08-01 DIAGNOSIS — I4891 Unspecified atrial fibrillation: Secondary | ICD-10-CM | POA: Diagnosis not present

## 2024-08-01 DIAGNOSIS — M109 Gout, unspecified: Secondary | ICD-10-CM | POA: Diagnosis not present

## 2024-08-01 DIAGNOSIS — M4316 Spondylolisthesis, lumbar region: Secondary | ICD-10-CM | POA: Diagnosis not present

## 2024-08-01 DIAGNOSIS — Z9842 Cataract extraction status, left eye: Secondary | ICD-10-CM | POA: Diagnosis not present

## 2024-08-01 DIAGNOSIS — Z7901 Long term (current) use of anticoagulants: Secondary | ICD-10-CM | POA: Insufficient documentation

## 2024-08-01 DIAGNOSIS — Z9841 Cataract extraction status, right eye: Secondary | ICD-10-CM | POA: Diagnosis not present

## 2024-08-01 DIAGNOSIS — D508 Other iron deficiency anemias: Secondary | ICD-10-CM

## 2024-08-01 DIAGNOSIS — Z8349 Family history of other endocrine, nutritional and metabolic diseases: Secondary | ICD-10-CM | POA: Diagnosis not present

## 2024-08-01 DIAGNOSIS — M47816 Spondylosis without myelopathy or radiculopathy, lumbar region: Secondary | ICD-10-CM | POA: Diagnosis not present

## 2024-08-01 DIAGNOSIS — D649 Anemia, unspecified: Secondary | ICD-10-CM | POA: Diagnosis not present

## 2024-08-01 DIAGNOSIS — E611 Iron deficiency: Secondary | ICD-10-CM | POA: Diagnosis not present

## 2024-08-01 DIAGNOSIS — K922 Gastrointestinal hemorrhage, unspecified: Secondary | ICD-10-CM | POA: Insufficient documentation

## 2024-08-01 DIAGNOSIS — Z806 Family history of leukemia: Secondary | ICD-10-CM | POA: Insufficient documentation

## 2024-08-01 DIAGNOSIS — Z833 Family history of diabetes mellitus: Secondary | ICD-10-CM | POA: Insufficient documentation

## 2024-08-01 DIAGNOSIS — Z809 Family history of malignant neoplasm, unspecified: Secondary | ICD-10-CM | POA: Insufficient documentation

## 2024-08-01 DIAGNOSIS — E1122 Type 2 diabetes mellitus with diabetic chronic kidney disease: Secondary | ICD-10-CM | POA: Diagnosis not present

## 2024-08-01 DIAGNOSIS — Z8249 Family history of ischemic heart disease and other diseases of the circulatory system: Secondary | ICD-10-CM | POA: Insufficient documentation

## 2024-08-01 DIAGNOSIS — E039 Hypothyroidism, unspecified: Secondary | ICD-10-CM | POA: Insufficient documentation

## 2024-08-01 DIAGNOSIS — I129 Hypertensive chronic kidney disease with stage 1 through stage 4 chronic kidney disease, or unspecified chronic kidney disease: Secondary | ICD-10-CM | POA: Diagnosis not present

## 2024-08-01 DIAGNOSIS — N1832 Chronic kidney disease, stage 3b: Secondary | ICD-10-CM | POA: Diagnosis not present

## 2024-08-01 DIAGNOSIS — N183 Chronic kidney disease, stage 3 unspecified: Secondary | ICD-10-CM | POA: Diagnosis not present

## 2024-08-01 DIAGNOSIS — M069 Rheumatoid arthritis, unspecified: Secondary | ICD-10-CM | POA: Diagnosis not present

## 2024-08-01 LAB — CBC WITH DIFFERENTIAL (CANCER CENTER ONLY)
Abs Immature Granulocytes: 0.01 K/uL (ref 0.00–0.07)
Basophils Absolute: 0.1 K/uL (ref 0.0–0.1)
Basophils Relative: 1 %
Eosinophils Absolute: 0.6 K/uL — ABNORMAL HIGH (ref 0.0–0.5)
Eosinophils Relative: 11 %
HCT: 27.7 % — ABNORMAL LOW (ref 36.0–46.0)
Hemoglobin: 8.8 g/dL — ABNORMAL LOW (ref 12.0–15.0)
Immature Granulocytes: 0 %
Lymphocytes Relative: 16 %
Lymphs Abs: 0.9 K/uL (ref 0.7–4.0)
MCH: 31.2 pg (ref 26.0–34.0)
MCHC: 31.8 g/dL (ref 30.0–36.0)
MCV: 98.2 fL (ref 80.0–100.0)
Monocytes Absolute: 0.4 K/uL (ref 0.1–1.0)
Monocytes Relative: 7 %
Neutro Abs: 3.4 K/uL (ref 1.7–7.7)
Neutrophils Relative %: 65 %
Platelet Count: 209 K/uL (ref 150–400)
RBC: 2.82 MIL/uL — ABNORMAL LOW (ref 3.87–5.11)
RDW: 15.1 % (ref 11.5–15.5)
WBC Count: 5.3 K/uL (ref 4.0–10.5)
nRBC: 0 % (ref 0.0–0.2)

## 2024-08-01 LAB — IRON AND IRON BINDING CAPACITY (CC-WL,HP ONLY)
Iron: 43 ug/dL (ref 28–170)
Saturation Ratios: 16 % (ref 10.4–31.8)
TIBC: 266 ug/dL (ref 250–450)
UIBC: 223 ug/dL (ref 148–442)

## 2024-08-01 LAB — CMP (CANCER CENTER ONLY)
ALT: 22 U/L (ref 0–44)
AST: 31 U/L (ref 15–41)
Albumin: 3.9 g/dL (ref 3.5–5.0)
Alkaline Phosphatase: 71 U/L (ref 38–126)
Anion gap: 7 (ref 5–15)
BUN: 18 mg/dL (ref 8–23)
CO2: 29 mmol/L (ref 22–32)
Calcium: 9.5 mg/dL (ref 8.9–10.3)
Chloride: 107 mmol/L (ref 98–111)
Creatinine: 1.56 mg/dL — ABNORMAL HIGH (ref 0.44–1.00)
GFR, Estimated: 34 mL/min — ABNORMAL LOW (ref >60)
Glucose, Bld: 92 mg/dL (ref 70–99)
Potassium: 4.2 mmol/L (ref 3.5–5.1)
Sodium: 143 mmol/L (ref 135–145)
Total Bilirubin: 0.3 mg/dL (ref 0.0–1.2)
Total Protein: 6.6 g/dL (ref 6.5–8.1)

## 2024-08-01 LAB — FERRITIN: Ferritin: 139 ng/mL (ref 11–307)

## 2024-08-01 LAB — PROTEIN / CREATININE RATIO, URINE: Creatinine, Urine: 143.8

## 2024-08-01 LAB — MICROALBUMIN / CREATININE URINE RATIO: Microalb Creat Ratio: 48

## 2024-08-01 NOTE — Telephone Encounter (Signed)
 PAP: Application for Marcelline Deist has been submitted to [employer] ([employer]), via fax

## 2024-08-01 NOTE — Telephone Encounter (Signed)
 Spoke with patient and advised we have faxed the order multiple times. Patient advised she may come pick up the order and take it over. Patient advised that the infusion center will reach out to schedule her Prolia .

## 2024-08-01 NOTE — Telephone Encounter (Signed)
 Patient left a voicemail regarding her referral to [facility] for a bone density scan.  Patient states she just called [facility] and was told they haven't received the referral from our office.  Patient states she talked with someone from the office yesterday and was told they were sending the referral for the second time.  Patient wants to confirm that we have the correct fax number #[phone].  Patient requested a return call to let her know that it has been sent.  Patient states she also has a question regarding an approval letter that she received for her Prolia  injection that she needs to have done by the end of the year.

## 2024-08-02 ENCOUNTER — Telehealth: Payer: Self-pay | Admitting: Pharmacy Technician

## 2024-08-02 LAB — KAPPA/LAMBDA LIGHT CHAINS
Kappa free light chain: 46.2 mg/L — ABNORMAL HIGH (ref 3.3–19.4)
Kappa, lambda light chain ratio: 0.12 — ABNORMAL LOW (ref 0.26–1.65)
Lambda free light chains: 390.7 mg/L — ABNORMAL HIGH (ref 5.7–26.3)

## 2024-08-02 NOTE — Telephone Encounter (Signed)
 Auth Submission: APPROVED Site of care: Site of care: [facility] Payer: [employer] MEDIC[employer] Medication & CPT/J Code(s) submitted: Prolia  (Denosumab ) [id number] Diagnosis Code: M81.0 Route of submission (phone, fax, portal):  Phone # Fax # Auth type: Buy/Bill HB Units/visits requested: 60MG  X 2 DOSES Reference number: [id number] Approval from: 05/25/23 to 09/27/25

## 2024-08-04 ENCOUNTER — Encounter (HOSPITAL_COMMUNITY): Payer: Self-pay | Admitting: Gastroenterology

## 2024-08-04 ENCOUNTER — Other Ambulatory Visit (HOSPITAL_COMMUNITY): Payer: Self-pay | Admitting: Rheumatology

## 2024-08-07 ENCOUNTER — Inpatient Hospital Stay (HOSPITAL_BASED_OUTPATIENT_CLINIC_OR_DEPARTMENT_OTHER): Admitting: Hematology

## 2024-08-07 DIAGNOSIS — E1122 Type 2 diabetes mellitus with diabetic chronic kidney disease: Secondary | ICD-10-CM | POA: Diagnosis not present

## 2024-08-07 DIAGNOSIS — M109 Gout, unspecified: Secondary | ICD-10-CM | POA: Diagnosis not present

## 2024-08-07 DIAGNOSIS — N1832 Chronic kidney disease, stage 3b: Secondary | ICD-10-CM | POA: Diagnosis not present

## 2024-08-07 DIAGNOSIS — D472 Monoclonal gammopathy: Secondary | ICD-10-CM | POA: Diagnosis not present

## 2024-08-07 DIAGNOSIS — M3214 Glomerular disease in systemic lupus erythematosus: Secondary | ICD-10-CM | POA: Diagnosis not present

## 2024-08-07 DIAGNOSIS — D508 Other iron deficiency anemias: Secondary | ICD-10-CM

## 2024-08-07 DIAGNOSIS — M069 Rheumatoid arthritis, unspecified: Secondary | ICD-10-CM | POA: Diagnosis not present

## 2024-08-07 DIAGNOSIS — I129 Hypertensive chronic kidney disease with stage 1 through stage 4 chronic kidney disease, or unspecified chronic kidney disease: Secondary | ICD-10-CM | POA: Diagnosis not present

## 2024-08-07 DIAGNOSIS — E039 Hypothyroidism, unspecified: Secondary | ICD-10-CM | POA: Diagnosis not present

## 2024-08-07 DIAGNOSIS — E785 Hyperlipidemia, unspecified: Secondary | ICD-10-CM | POA: Diagnosis not present

## 2024-08-07 LAB — MULTIPLE MYELOMA PANEL, SERUM
Albumin SerPl Elph-Mcnc: 3.8 g/dL (ref 2.9–4.4)
Albumin/Glob SerPl: 1.6 (ref 0.7–1.7)
Alpha 1: 0.3 g/dL (ref 0.0–0.4)
Alpha2 Glob SerPl Elph-Mcnc: 0.7 g/dL (ref 0.4–1.0)
B-Globulin SerPl Elph-Mcnc: 0.9 g/dL (ref 0.7–1.3)
Gamma Glob SerPl Elph-Mcnc: 0.7 g/dL (ref 0.4–1.8)
Globulin, Total: 2.5 g/dL (ref 2.2–3.9)
IgA: 155 mg/dL (ref 64–422)
IgG (Immunoglobin G), Serum: 811 mg/dL (ref 586–1602)
IgM (Immunoglobulin M), Srm: 45 mg/dL (ref 26–217)
Total Protein ELP: 6.3 g/dL (ref 6.0–8.5)

## 2024-08-07 NOTE — Progress Notes (Signed)
 HEMATOLOGY ONCOLOGY PROGRESS NOTE  Date of service: 08/07/2024  Patient Care Team: Hannah Medici, MD as PCP - General (Internal Medicine) Hannah Emeline BRAVO, MD as PCP - Cardiology (Cardiology) Morgan, Clayborne CROME, RN as [facility] Hannah [name], [facility] as Pharmacist (Pharmacist) Hannah Emeline BRAVO, MD (Cardiology)  CHIEF COMPLAINT/PURPOSE OF CONSULTATION: Follow-up for continued evaluation and management of  #1 Normocytic normochromic anemia likely multifactorial related to her chronic inflammatory state due to a lupus and CKD and 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 female who has been referred to us  by Dr [name] Hannah for evaluation and management of monoclonal gammopathy of undetermined significance and anemia.   Patient has a history of hypertension, diabetes, dyslipidemia, gout, hypothyroidism, chronic and disease stage III thought to be related to hypertension. She notes that she sees Dr. Dolphus and was recently diagnosed with SLE/rheumatoid arthritis. Her workup also demonstrated monoclonal gammopathy of undetermined significance with an M spike of 0.8 of the total 1.5 g/dL of protein in the gammaglobulin region. IFE showed that this was a monoclonal IgG lambda protein.total IgG 1710.   Other rheumatology workup showed a rheumatoid factor of 29, positive ANA with titers 1:320  Centromere pattern.Smith antibody positive.   CBC on 06/06/2015 showed a hemoglobin of 8.6, hematocrit 26. 88.7 RDW 18.1 with platelet count of 171k normal WBC count of 5.4k.   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 Has had some fatigue for a while and does not note that this has worsened significantly.. She is a part of a Scientific laboratory technician citizen exercise program -involves yoga cardio and  Weight  training.   Notes that she has been Anemic in the past and has been on over-the-counter iron  supplements for iron  deficiency. No other known blood disorders. Denies need for recent transfusions.  INTERVAL HISTORY: I connected with [name] on 08/07/2024 at  3:30 PM EST by telephone and verified that I am speaking with the correct person using two identifiers.   I discussed the limitations, risks, security and privacy concerns of performing an evaluation and management service by telemedicine and the availability of in-person appointments. I also discussed with the patient that there may be a patient responsible charge related to this service. The patient expressed understanding and agreed to proceed.   Other persons participating in the visit and their role in the encounter: Medical Scribe, Hannah [name].  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   Today, she reports that she recently underwent a kidney biopsy, which was inconclusive after initially being told there may be a mass Denies any bleeding issues (nose bleeds, gum bleeds, abnormal/spontaneous bruising). Reports that she hasn't been taking PO Iron  due to expense, but has done IV iron  before.   She states that on Wednesday she will be having her annual appointment with PCP, and will also be going to see her Cardiologist, which she is followed annually. She says that she is still  taking Eliquis . She does see Dr. Dolphus for her Lupus. She does endorse some persisting arthrtic pain.  REVIEW OF SYSTEMS:   10 Point review of systems of done and is  negative except as noted above.  MEDICAL HISTORY Past Medical History:  Diagnosis Date   Allergy  25 years ago.   Anemia    Arthritis    Asthma    Atrial fibrillation (HCC)    Cataract Had cataracts surgery 2015 & 2016   Chronic kidney disease    Diabetes mellitus without complication (HCC)    Gout 12/17/2014   patient reported   Heart murmur 4 years ago   Hyperlipidemia    Hypertension    Hypothyroidism    Systemic lupus erythematosus (HCC)    Ulcer    Vitiligo   vitamin D  deficiency Newly diagnosed rheumatoid arthritis/SLE Chronic kidney disease stage III Hypothyroidism  SURGICAL HISTORY Past Surgical History:  Procedure Laterality Date   BIOPSY  09/26/2023   Procedure: BIOPSY;  Surgeon: Hannah Gwendlyn DASEN, MD;  Location: [facility];  Service: Gastroenterology;;   CATARACT EXTRACTION Bilateral 2015   COLONOSCOPY WITH PROPOFOL  N/A 09/27/2023   Procedure: COLONOSCOPY WITH PROPOFOL ;  Surgeon: Hannah Lamprey, MD;  Location: [facility];  Service: Gastroenterology;  Laterality: N/A;   DILATION AND CURETTAGE OF UTERUS     DOPPLER ECHOCARDIOGRAPHY  05/2018   Internist to review with pt; potential heart murmur 06/20/18   ESOPHAGOGASTRODUODENOSCOPY (EGD) WITH PROPOFOL  N/A 09/26/2023   Procedure: ESOPHAGOGASTRODUODENOSCOPY (EGD) WITH PROPOFOL ;  Surgeon: Hannah Gwendlyn DASEN, MD;  Location: [facility];  Service: Gastroenterology;  Laterality: N/A;   EYE SURGERY  Cataract Surgery, 2015 & 2016.   keratosis removal  2021   KNEE CLOSED REDUCTION Left 07/06/2022   Procedure: CLOSED MANIPULATION KNEE;  Surgeon: Hannah Lerner, MD;  Location: [facility];  Service: Orthopedics;  Laterality: Left;   KNEE CLOSED REDUCTION Right 09/13/2023   Procedure: CLOSED MANIPULATION KNEE;  Surgeon: Hannah Lerner, MD;  Location: [facility];  Service: Orthopedics;  Laterality: Right;   REPLACEMENT TOTAL KNEE Left    SKIN SURGERY  11/30/2018   left side of face   TOOTH EXTRACTION     TOTAL KNEE ARTHROPLASTY Left  02/02/2022   Procedure: TOTAL KNEE ARTHROPLASTY;  Surgeon: Hannah Lerner, MD;  Location: [facility];  Service: Orthopedics;  Laterality: Left;   TOTAL KNEE ARTHROPLASTY Right 07/12/2023   Procedure: RIGHT TOTAL KNEE ARTHROPLASTY;  Surgeon: Hannah Lerner, MD;  Location: [facility];  Service: Orthopedics;  Laterality: Right;    SOCIAL HISTORY Social History   Tobacco Use   Smoking status: Never    Passive exposure: Never   Smokeless tobacco: Never  Vaping Use   Vaping status: Never Used  Substance Use Topics   Alcohol  use: Not Currently    Comment: Maybe a glass of wine every 3 months.   Drug use: No    Social History   Social History Narrative   Not on file    SOCIAL DRIVERS OF HEALTH SDOH Screenings   Food Insecurity: No Food Insecurity (07/04/2024)  Housing: Unknown (07/04/2024)  Transportation Needs: No Transportation Needs (07/04/2024)  Utilities: Not At Risk (07/04/2024)  Alcohol  Screen: Low Risk  (02/10/2024)  Depression (PHQ2-9): Low Risk  (07/25/2024)  Financial Resource Strain: Low Risk  (03/02/2024)  Physical Activity: Sufficiently Active (02/10/2024)  Social Connections: Unknown (07/04/2024)  Stress: No Stress Concern Present (02/10/2024)  Tobacco Use: Low Risk  (08/04/2024)  Health Literacy: Adequate Health Literacy (05/26/2024)     FAMILY HISTORY  Family History  Problem Relation Age of Onset   Heart disease Father    Diabetes Father    Hypertension Mother    Obesity Mother    Varicose Veins Mother    Hypertension Sister    Hypertension Sister    Leukemia Sister    Cancer Sister    Obesity Sister      ALLERGIES: is allergic to shellfish allergy  and shellfish protein-containing drug products.  MEDICATIONS  Current Outpatient Medications  Medication Sig Dispense Refill   Accu-Chek Softclix Lancets lancets Use to check blood sugars daily E11.69 100 each 2   albuterol  (PROVENTIL  HFA;VENTOLIN  HFA) 108 (90 Base) MCG/ACT inhaler Inhale 2 puffs into the lungs every  6 (six) hours as needed for wheezing or shortness of breath. 1 Inhaler 2   Alcohol  Swabs (ALCOHOL  PADS) 70 % PADS Use as directed to check blood sugars 1 time per day dx: e11.22 150 each 2   allopurinol  (ZYLOPRIM ) 100 MG tablet TAKE 1 TABLET BY MOUTH 3 TIMES A WEEK 90 tablet 2   amLODipine  (NORVASC ) 5 MG tablet TAKE 1 TABLET(5 MG) BY MOUTH AT BEDTIME 90 tablet 2   apixaban  (ELIQUIS ) 5 MG TABS tablet Take 1 tablet (5 mg total) by mouth 2 (two) times daily. 60 tablet 11   Ascorbic Acid (VITAMIN C PO) Take 500 mg by mouth daily with lunch.     atorvastatin  (LIPITOR) 80 MG tablet TAKE 1 TABLET BY MOUTH EVERY DAY 90 tablet 2   B Complex-C (B-COMPLEX WITH VITAMIN C) tablet Take 1 tablet by mouth daily with lunch.     budesonide -formoterol  (SYMBICORT ) 160-4.5 MCG/ACT inhaler INHALE 2 PUFFS BY MOUTH TWICE DAILY IN THE MORNING AND IN THE EVENING (Patient taking differently: Inhale 2 puffs into the lungs as needed. INHALE 2 PUFFS BY MOUTH TWICE DAILY IN THE MORNING AND IN THE EVENING) 10.2 g 6   Calcium  Carb-Cholecalciferol 600-800 MG-UNIT TABS Take 1 tablet by mouth daily with lunch.     Cholecalciferol (VITAMIN D  PO) Take 2,000 Units by mouth daily with lunch.     dapagliflozin  propanediol (FARXIGA ) 10 MG TABS tablet Take 1 tablet (10 mg total) by mouth daily before breakfast. 90 tablet 3   denosumab  (PROLIA ) 60 MG/ML SOSY injection Inject 60 mg into the skin every 6 (six) months. Courier to rheum: [address], [place] [address]. Appt on 12/07/2022 1 mL 0   glucose blood (ACCU-CHEK GUIDE) test strip USE TO CHECK BLOOD SUGAR DAILY 100 strip 2   hydroxychloroquine  (PLAQUENIL ) 200 MG tablet TAKE 1 TABLET BY MOUTH TWICE DAILY, MONDAY THROUGH FRIDAY ONLY 120 tablet 0   irbesartan  (AVAPRO ) 300 MG tablet TAKE 1 TABLET(300 MG) BY MOUTH DAILY 90 tablet 2   metoprolol  succinate (TOPROL -XL) 25 MG 24 hr tablet Take 1 tablet (25 mg total) by mouth daily. 90 tablet 3   Multiple Vitamin (MULTIVITAMIN WITH  MINERALS) TABS tablet Take 1 tablet by mouth daily with lunch. Women's Multivitamin     pantoprazole  (PROTONIX ) 40 MG tablet Take 40 mg by mouth every morning.     Polyethyl Glycol-Propyl Glycol (SYSTANE) 0.4-0.3 % SOLN Place 1-2 drops into both eyes 3 (three) times daily as needed (dry/irritated eyes.).     SYNTHROID  75 MCG tablet Take 1 tablet MONDAY through FRIDAY 90 tablet 2   SYNTHROID  88 MCG tablet TAKE 1 TABLET BY MOUTH EVERY SATURDAY AND SUNDAY 90 tablet 2   No current facility-administered medications for this visit.    PHYSICAL EXAMINATION  TELEPHONE VISIT:  GENERAL: sounds alert, in no acute distress and comfortable PSYCH: sounds alert & oriented x 3 with fluent speech  LABORATORY DATA:   I have reviewed the data as listed     Latest Ref Rng & Units 08/01/2024    7:45 AM 07/14/2024    9:49 AM 07/05/2024    3:14 AM  CBC EXTENDED  WBC 4.0 - 10.5 K/uL 5.3  5.5  5.9   RBC 3.87 - 5.11 MIL/uL 2.82  2.78  2.34   Hemoglobin 12.0 - 15.0 g/dL 8.8  8.8  7.3   HCT 63.9 - 46.0 % 27.7  28.0  23.7   Platelets 150 - 400 K/uL 209  203  176   NEUT# 1.7 - 7.7 K/uL 3.4     Lymph# 0.7 - 4.0 K/uL 0.9      Iron  Studies:  Lab Results  Component Value Date   IRON  43 08/01/2024   UIBC 223 08/01/2024   TIBC 266 08/01/2024   IRONPCTSAT 16 08/01/2024   FERRITIN 139 08/01/2024      Latest Ref Rng & Units 08/01/2024    7:45 AM 07/14/2024    9:49 AM 07/05/2024    3:14 AM  CMP  Glucose 70 - 99 mg/dL 92  897  85   BUN 8 - 23 mg/dL 18  23  20    Creatinine 0.44 - 1.00 mg/dL 8.43  8.33  8.58   Sodium 135 - 145 mmol/L 143  142  139   Potassium 3.5 - 5.1 mmol/L 4.2  4.2  3.9   Chloride 98 - 111 mmol/L 107  102  109   CO2 22 - 32 mmol/L 29  26  23    Calcium  8.9 - 10.3 mg/dL 9.5  9.7  8.7   Total Protein 6.5 - 8.1 g/dL 6.6   5.0   Total Bilirubin 0.0 - 1.2 mg/dL 0.3   0.2   Alkaline Phos 38 - 126 U/L 71   41   AST 15 - 41 U/L 31   24   ALT 0 - 44 U/L 22   14    MULTIPLE MYELOMA &  KAPPA/LAMBDA LIGHT CHAINS 09/2022 - 07/2024  Immunofixation reveals the presence of monoclonal free Lambda light chain. Suggest serum FLC quantitation and/or urine Immunofixation.              RADIOGRAPHIC STUDIES: I have personally reviewed the radiological images as listed and agreed with the findings in the report. US  RENAL Result Date: 07/05/2024 CLINICAL DATA:  Follow-up left perinephric hematoma following ultrasound-guided core needle biopsy on 07/04/2024 for stage 3 chronic kidney disease. EXAM: RENAL / URINARY TRACT ULTRASOUND COMPLETE COMPARISON:  07/04/2024 and 12/04/2015.  MR dated 03/06/2016. FINDINGS: Right Kidney: Renal measurements: 7.9 x 4.2 x 4.2 cm = volume: 73.1 mL. Diffusely echogenic. Multiple simple cysts not requiring imaging follow-up. No hydronephrosis. Left Kidney: Renal measurements: 9.0 x 5.1 x 4.4 cm = volume: 106.9 mL. Diffusely echogenic. 2.6 x 2.3 x 1.8 cm cortical based cystic and solid mass. The solid component is polypoid and shape and has prominent internal blood flow with color Doppler. There are additional simple appearing cysts not requiring imaging follow-up. Curvilinear perinephric hematoma measuring 7 mm in maximum thickness in the sagittal plane and 12 mm in maximum thickness in the axial plane. This is better defined, smaller and more hypoechoic than the perinephric hematoma seen following biopsy yesterday, measuring 2.1 cm in maximum thickness in the sagittal plane yesterday. Bladder: Appears normal  for degree of bladder distention. Other: None. IMPRESSION: 1. 2.6 cm complex cystic and solid left renal mass with prominent internal blood flow with color Doppler. This is suspicious for a renal cell carcinoma. Depending on the results from the recent renal biopsy, further evaluation with pre and postcontrast MRI of the kidneys is recommended. 2. Small left perinephric hematoma, decreased in size and more hypoechoic than seen following biopsy yesterday. 3.  Diffusely echogenic kidneys bilaterally consistent with medical renal disease. 4. Bilateral simple cysts not requiring imaging follow-up. These results will be called to the ordering clinician or representative by the Radiologist Assistant, and communication documented in the PACS or [employer]. Electronically Signed   By: Elspeth Bathe M.D.   On: 07/05/2024 11:47   US  BIOPSY (KIDNEY) Result Date: 07/04/2024 INDICATION: Chronic kidney disease, stage III.  Request for renal biopsy. EXAM: ULTRASOUND-GUIDED RANDOM RENAL BIOPSY MEDICATIONS: Moderate sedation ANESTHESIA/SEDATION: Moderate (conscious) sedation was employed during this procedure. A total of Versed  2 mg and Fentanyl  100 mcg was administered intravenously by the radiology nurse. Total intra-service moderate Sedation Time: 31 minutes. The patient's level of consciousness and vital signs were monitored continuously by radiology nursing throughout the procedure under my direct supervision. FLUOROSCOPY TIME:  None COMPLICATIONS: SIR LEVEL B - Normal therapy, includes overnight admission for observation. PROCEDURE: Informed written consent was obtained from the patient after a thorough discussion of the procedural risks, benefits and alternatives. All questions were addressed. A timeout was performed prior to the initiation of the procedure. Patient was placed prone. Both kidneys were evaluated with ultrasound. Left kidney was selected for biopsy. Left flank was prepped and draped in sterile fashion. Maximal barrier sterile technique was utilized including caps, mask, sterile gowns, sterile gloves, sterile drape, hand hygiene and skin antiseptic. Skin was anesthetized using 1% lidocaine . Small incision was made. Using ultrasound guidance, a 17 gauge coaxial needle was directed to the lower pole cortex. Two core biopsies were obtained from the lower pole cortex with an 18 gauge core device. Specimens placed in saline. Gel-Foam slurry was injected as the 17  gauge coaxial needle was retracted. Pressure was applied to the puncture site for approximately 10 minutes while observing with ultrasound. Bandage placed over the puncture site. FINDINGS: 2 adequate core biopsy specimens were obtained. Perinephric hematoma was immediately identified after the second core biopsy. Pressure was applied to the puncture site for approximately 10 minutes under ultrasound observation. After 10 minutes, the hematoma appeared to be stable in size and there was no evidence for active bleeding based on color Doppler imaging. Patient was asymptomatic after the biopsy and stable hemoglobin approximately 2.5 hours after the biopsy. However, the patient had dark hematuria throughout the recovery period. As a result, the patient will be admitted for observation. IMPRESSION: 1. Ultrasound-guided core biopsy from the left kidney lower pole. 2. Perinephric hematoma identified immediately following the biopsy. Patient had asymptomatic hematuria after the procedure. Patient will be admitted overnight for observation. Electronically Signed   By: Juliene Balder M.D.   On: 07/04/2024 15:48   US  LIMITED JOINT SPACE STRUCTURES UP BILAT Result Date: 05/11/2024 Ultrasound examination of bilateral hands was performed per EULAR recommendations. Using 15 MHz transducer, grayscale and power Doppler bilateral second and third MCP joints both dorsal and volar aspects were evaluated to look for synovitis or tenosynovitis. The findings were there was no synovitis or tenosynovitis on ultrasound examination.  Narrowing of MCP joints was noted. Impression: No synovitis or tenosynovitis was noted on the ultrasound  examination.      METASTATIC BONE SURVEY 0/02/2015   COMPARISON:  None.   FINDINGS: Heart is normal size. Right diaphragmatic hernia again noted, unchanged. Lungs are clear. No effusions.   No focal lytic lesions within the visualized bony structures or acute bony abnormality. Degenerative changes  within the shoulders, lower cervical spine, mid to lower thoracic spine, and lower lumbar spine. Advanced degenerative facet disease in the lower lumbar spine. 9 mm of anterolisthesis of L4 on L5. Endplate sclerosis within L4 and L5. Mild degenerative changes in the hips. Advanced degenerative changes within the knees. 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   IMPRESSION: No focal lytic lesion.   Degenerative joint disease involving multiple joints as described above.   Grade 2 anterolisthesis of L4 on L5 related to facet disease.   ASSESSMENT & PLAN:  78 y.o. female with  #1 IgG kappa monoclonal gammopathy of undetermined significance. Bone survey X-ray showed no lytic lesions. M spike is 0.6 g/dL in 89/983 and then 9.4h/io in 03/7981. SPEP in May/2017 and 07/2016 showed M proteinwas stable at 0.3g/dl  MGUS likely related to underlying connective tissue disorder.   Bone marrow biopsy does show about 13% plasma cells however these appear to be polyclonal and likely related to a possible underlying inflammatory disorder. Less likely a biclonal plasma cell dyscrasia.   #2 Normocytic normochromic anemia likely related to chronic inflammation from recent diagnosis of lupus/rheumatoid arthritis + CKD.  Increased acanthocytes on peripheral blood smear -no overt evidence of liver disease. Could be a marker of some MDS Bone marrow biopsy did not show overt plasma cell dyscrasia or myelodysplastic syndrome. 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 Bone marrow biopsy showed decreased iron  stores. Ferritin and iron  have been trending down, she has not noticed any issues with bruising or bleeding recently. ?absorption issue. I have given her the option of IV iron  which she has previously had without issue. She would like to have this.    PLAN: - Discussed lab results on 08/01/2024 in detail with  patient: CBC showed WBC of 5.3K, Hemoglobin of 8.8 has not changed  from 07/14/2024, and PLTs of 209K.  PLTs at goal of 200-500. Some iron  deficiency still.  CMP with Creatinine 1.56 decreased from 1.66. Stable CKD.  M protein not observed Kappa/Lambda Lights Chains elevated, K/L Ratio 0.12 decreased from 0.15 Iron  Panel and Ferritin: Iron % 16 and Ferritin 139  Saturation slightly low.   - Kidney biopsy was to rule out other contributing factors to her CKD.  FOLLOW-UP  IV feraheme  510mg  weekly x 2 doses at [address] Phone visit with Dr Onesimo in 4 months Labs 2 weeks prior to phone visit  The total time spent in the appointment was *** minutes* .  All of the patient's questions were answered and the patient knows to call the clinic with any problems, questions, or concerns.  Emaline Onesimo MD MS AAHIVMS [facility] Hematology/Oncology Physician [facility]  *Total Encounter Time as defined by the Centers for Medicare and Medicaid Services includes, in addition to the face-to-face time of a patient visit (documented in the note above) non-face-to-face time: obtaining and reviewing outside history, ordering and reviewing medications, tests or procedures, care coordination (communications with other health care professionals or caregivers) and documentation in the medical record.  I,Emily Lagle,acting as a neurosurgeon for Emaline Onesimo, MD.,have documented all relevant documentation  on the behalf of Emaline Saran, MD,as directed by  Emaline Saran, MD while in the presence of Emaline Saran, MD.  I have reviewed the above documentation for accuracy and completeness, and I agree with the above.  Malesha Suliman, MD

## 2024-08-08 DIAGNOSIS — E1122 Type 2 diabetes mellitus with diabetic chronic kidney disease: Secondary | ICD-10-CM | POA: Diagnosis not present

## 2024-08-08 DIAGNOSIS — I129 Hypertensive chronic kidney disease with stage 1 through stage 4 chronic kidney disease, or unspecified chronic kidney disease: Secondary | ICD-10-CM | POA: Diagnosis not present

## 2024-08-08 DIAGNOSIS — N183 Chronic kidney disease, stage 3 unspecified: Secondary | ICD-10-CM | POA: Diagnosis not present

## 2024-08-08 DIAGNOSIS — M329 Systemic lupus erythematosus, unspecified: Secondary | ICD-10-CM | POA: Diagnosis not present

## 2024-08-08 DIAGNOSIS — D631 Anemia in chronic kidney disease: Secondary | ICD-10-CM | POA: Diagnosis not present

## 2024-08-08 DIAGNOSIS — N2889 Other specified disorders of kidney and ureter: Secondary | ICD-10-CM | POA: Diagnosis not present

## 2024-08-08 DIAGNOSIS — D472 Monoclonal gammopathy: Secondary | ICD-10-CM | POA: Diagnosis not present

## 2024-08-09 ENCOUNTER — Telehealth: Payer: Self-pay | Admitting: Pharmacy Technician

## 2024-08-09 ENCOUNTER — Encounter: Payer: Self-pay | Admitting: Internal Medicine

## 2024-08-09 ENCOUNTER — Ambulatory Visit: Payer: Self-pay | Admitting: Internal Medicine

## 2024-08-09 VITALS — BP 128/70 | HR 67 | Temp 98.3°F | Ht <= 58 in | Wt 168.4 lb

## 2024-08-09 DIAGNOSIS — Z0000 Encounter for general adult medical examination without abnormal findings: Secondary | ICD-10-CM | POA: Diagnosis not present

## 2024-08-09 DIAGNOSIS — E1122 Type 2 diabetes mellitus with diabetic chronic kidney disease: Secondary | ICD-10-CM

## 2024-08-09 DIAGNOSIS — E66811 Obesity, class 1: Secondary | ICD-10-CM | POA: Diagnosis not present

## 2024-08-09 DIAGNOSIS — L821 Other seborrheic keratosis: Secondary | ICD-10-CM | POA: Diagnosis not present

## 2024-08-09 DIAGNOSIS — Z8739 Personal history of other diseases of the musculoskeletal system and connective tissue: Secondary | ICD-10-CM | POA: Diagnosis not present

## 2024-08-09 DIAGNOSIS — I1310 Hypertensive heart and chronic kidney disease without heart failure, with stage 1 through stage 4 chronic kidney disease, or unspecified chronic kidney disease: Secondary | ICD-10-CM

## 2024-08-09 DIAGNOSIS — Z6834 Body mass index (BMI) 34.0-34.9, adult: Secondary | ICD-10-CM

## 2024-08-09 DIAGNOSIS — Z6835 Body mass index (BMI) 35.0-35.9, adult: Secondary | ICD-10-CM

## 2024-08-09 DIAGNOSIS — M3219 Other organ or system involvement in systemic lupus erythematosus: Secondary | ICD-10-CM

## 2024-08-09 DIAGNOSIS — K5730 Diverticulosis of large intestine without perforation or abscess without bleeding: Secondary | ICD-10-CM

## 2024-08-09 DIAGNOSIS — I480 Paroxysmal atrial fibrillation: Secondary | ICD-10-CM | POA: Diagnosis not present

## 2024-08-09 DIAGNOSIS — E039 Hypothyroidism, unspecified: Secondary | ICD-10-CM

## 2024-08-09 DIAGNOSIS — N1832 Chronic kidney disease, stage 3b: Secondary | ICD-10-CM

## 2024-08-09 DIAGNOSIS — E6609 Other obesity due to excess calories: Secondary | ICD-10-CM

## 2024-08-09 DIAGNOSIS — Z87898 Personal history of other specified conditions: Secondary | ICD-10-CM

## 2024-08-09 NOTE — Progress Notes (Unsigned)
 I,Hannah [name], CMA,acting as a neurosurgeon for Hannah LOISE Slocumb, MD.,have documented all relevant documentation on the behalf of Hannah LOISE Slocumb, MD,as directed by  Hannah LOISE Slocumb, MD while in the presence of Hannah LOISE Slocumb, MD.  Subjective:    Patient ID: Hannah [name] , female    DOB: 11-12-1945 , 78 y.o.   MRN: [id number]  Chief Complaint  Patient presents with  . Annual Exam    Patient presents today for annual exam. She reports compliance with medications. Denies headache, chest pain & sob.  EKG completed on 07/04/24.  Letter sent for mammogram result. She asks if she can get a new referral to derm. Her current derm has a 6 month wait list to be seen.  . Diabetes  . Hypertension  . Hypothyroidism    HPI Discussed the use of AI scribe software for clinical note transcription with the patient, who gave verbal consent to proceed.  History of Present Illness    Diabetes She presents for her follow-up diabetic visit. She has type 2 diabetes mellitus. There are no hypoglycemic associated symptoms. Pertinent negatives for hypoglycemia include no headaches. Pertinent negatives for diabetes include no blurred vision and no chest pain. There are no hypoglycemic complications. Diabetic complications include nephropathy. Risk factors for coronary artery disease include diabetes mellitus, dyslipidemia, hypertension, post-menopausal and sedentary lifestyle. She is following a diabetic diet. She participates in exercise three times a week. An ACE inhibitor/angiotensin II receptor blocker is being taken.  Hypertension This is a chronic problem. The current episode started more than 1 year ago. The problem has been gradually improving since onset. The problem is controlled. Pertinent negatives include no blurred vision, chest pain or headaches.     Past Medical History:  Diagnosis Date  . Allergy  25 years ago.  . Anemia   . Arthritis   . Asthma   . Atrial fibrillation (HCC)   .  Cataract Had cataracts surgery 2015 & 2016  . Chronic kidney disease   . Diabetes mellitus without complication (HCC)   . Gout 12/17/2014   patient reported  . Heart murmur 4 years ago  . Hyperlipidemia   . Hypertension   . Hypothyroidism   . Systemic lupus erythematosus (HCC)   . Ulcer   . Vitiligo      Family History  Problem Relation Age of Onset  . Heart disease Father   . Diabetes Father   . Hypertension Mother   . Obesity Mother   . Varicose Veins Mother   . Hypertension Sister   . Hypertension Sister   . Leukemia Sister   . Cancer Sister   . Obesity Sister      Current Outpatient Medications:  .  Accu-Chek Softclix Lancets lancets, Use to check blood sugars daily E11.69, Disp: 100 each, Rfl: 2 .  albuterol  (PROVENTIL  HFA;VENTOLIN  HFA) 108 (90 Base) MCG/ACT inhaler, Inhale 2 puffs into the lungs every 6 (six) hours as needed for wheezing or shortness of breath., Disp: 1 Inhaler, Rfl: 2 .  Alcohol  Swabs (ALCOHOL  PADS) 70 % PADS, Use as directed to check blood sugars 1 time per day dx: e11.22, Disp: 150 each, Rfl: 2 .  allopurinol  (ZYLOPRIM ) 100 MG tablet, TAKE 1 TABLET BY MOUTH 3 TIMES A WEEK, Disp: 90 tablet, Rfl: 2 .  amLODipine  (NORVASC ) 5 MG tablet, TAKE 1 TABLET(5 MG) BY MOUTH AT BEDTIME, Disp: 90 tablet, Rfl: 2 .  apixaban  (ELIQUIS ) 5 MG TABS tablet, Take 1 tablet (5 mg total)  by mouth 2 (two) times daily., Disp: 60 tablet, Rfl: 11 .  Ascorbic Acid (VITAMIN C PO), Take 500 mg by mouth daily with lunch., Disp: , Rfl:  .  atorvastatin  (LIPITOR) 80 MG tablet, TAKE 1 TABLET BY MOUTH EVERY DAY, Disp: 90 tablet, Rfl: 2 .  B Complex-C (B-COMPLEX WITH VITAMIN C) tablet, Take 1 tablet by mouth daily with lunch., Disp: , Rfl:  .  budesonide -formoterol  (SYMBICORT ) 160-4.5 MCG/ACT inhaler, INHALE 2 PUFFS BY MOUTH TWICE DAILY IN THE MORNING AND IN THE EVENING (Patient taking differently: Inhale 2 puffs into the lungs as needed. INHALE 2 PUFFS BY MOUTH TWICE DAILY IN THE MORNING  AND IN THE EVENING), Disp: 10.2 g, Rfl: 6 .  Calcium  Carb-Cholecalciferol 600-800 MG-UNIT TABS, Take 1 tablet by mouth daily with lunch., Disp: , Rfl:  .  Cholecalciferol (VITAMIN D  PO), Take 2,000 Units by mouth daily with lunch., Disp: , Rfl:  .  dapagliflozin  propanediol (FARXIGA ) 10 MG TABS tablet, Take 1 tablet (10 mg total) by mouth daily before breakfast., Disp: 90 tablet, Rfl: 3 .  denosumab  (PROLIA ) 60 MG/ML SOSY injection, Inject 60 mg into the skin every 6 (six) months. Courier to rheum: [address], [place] [address]. Appt on 12/07/2022, Disp: 1 mL, Rfl: 0 .  glucose blood (ACCU-CHEK GUIDE) test strip, USE TO CHECK BLOOD SUGAR DAILY, Disp: 100 strip, Rfl: 2 .  hydroxychloroquine  (PLAQUENIL ) 200 MG tablet, TAKE 1 TABLET BY MOUTH TWICE DAILY, MONDAY THROUGH FRIDAY ONLY, Disp: 120 tablet, Rfl: 0 .  irbesartan  (AVAPRO ) 300 MG tablet, TAKE 1 TABLET(300 MG) BY MOUTH DAILY, Disp: 90 tablet, Rfl: 2 .  metoprolol  succinate (TOPROL -XL) 25 MG 24 hr tablet, Take 1 tablet (25 mg total) by mouth daily., Disp: 90 tablet, Rfl: 3 .  Multiple Vitamin (MULTIVITAMIN WITH MINERALS) TABS tablet, Take 1 tablet by mouth daily with lunch. Women's Multivitamin, Disp: , Rfl:  .  pantoprazole  (PROTONIX ) 40 MG tablet, Take 40 mg by mouth every morning., Disp: , Rfl:  .  Polyethyl Glycol-Propyl Glycol (SYSTANE) 0.4-0.3 % SOLN, Place 1-2 drops into both eyes 3 (three) times daily as needed (dry/irritated eyes.)., Disp: , Rfl:  .  SYNTHROID  75 MCG tablet, Take 1 tablet MONDAY through FRIDAY, Disp: 90 tablet, Rfl: 2 .  SYNTHROID  88 MCG tablet, TAKE 1 TABLET BY MOUTH EVERY SATURDAY AND SUNDAY, Disp: 90 tablet, Rfl: 2   Allergies  Allergen Reactions  . Shellfish Allergy  Anaphylaxis, Hives and Other (See Comments)  . Shellfish Protein-Containing Drug Products Anaphylaxis and Other (See Comments)      The patient states she uses {contraceptive methods:5051} for birth control. No LMP recorded. Patient  is postmenopausal.. {Dysmenorrhea-menorrhagia:21918}. Negative for: breast discharge, breast lump(s), breast pain and breast self exam. Associated symptoms include abnormal vaginal bleeding. Pertinent negatives include abnormal bleeding (hematology), anxiety, decreased libido, depression, difficulty falling sleep, dyspareunia, history of infertility, nocturia, sexual dysfunction, sleep disturbances, urinary incontinence, urinary urgency, vaginal discharge and vaginal itching. Diet regular.The patient states her exercise level is    . The patient's tobacco use is:  Social History   Tobacco Use  Smoking Status Never  . Passive exposure: Never  Smokeless Tobacco Never  . She has been exposed to passive smoke. The patient's alcohol  use is:  Social History   Substance and Sexual Activity  Alcohol  Use Not Currently   Comment: Maybe a glass of wine every 3 months.  . Additional information: Last pap ***, next one scheduled for ***.  Review of Systems  Constitutional: Negative.   HENT: Negative.    Eyes: Negative.  Negative for blurred vision.  Respiratory: Negative.    Cardiovascular: Negative.  Negative for chest pain.  Gastrointestinal: Negative.   Endocrine: Negative.   Genitourinary: Negative.   Musculoskeletal: Negative.   Skin: Negative.   Allergic/Immunologic: Negative.   Neurological: Negative.  Negative for headaches.  Hematological: Negative.   Psychiatric/Behavioral: Negative.       Today's Vitals   08/09/24 1137  BP: 128/70  Pulse: 67  Temp: 98.3 F (36.8 C)  SpO2: 98%  Weight: 168 lb 6.4 oz (76.4 kg)  Height: 4' 10 (1.473 m)   Body mass index is 35.2 kg/m.  Wt Readings from Last 3 Encounters:  08/11/24 164 lb (74.4 kg)  08/09/24 168 lb 6.4 oz (76.4 kg)  07/14/24 160 lb (72.6 kg)     Objective:  Physical Exam Vitals and nursing note reviewed.  Constitutional:      Appearance: Normal appearance.  HENT:     Head: Normocephalic and atraumatic.     Right  Ear: Tympanic membrane, ear canal and external ear normal. There is no impacted cerumen.     Left Ear: Tympanic membrane, ear canal and external ear normal. There is no impacted cerumen.     Mouth/Throat:     Pharynx: No posterior oropharyngeal erythema.  Eyes:     Extraocular Movements: Extraocular movements intact.  Cardiovascular:     Rate and Rhythm: Normal rate and regular rhythm.     Pulses:          Dorsalis pedis pulses are 2+ on the right side and 2+ on the left side.     Heart sounds: Normal heart sounds.  Pulmonary:     Effort: Pulmonary effort is normal.     Breath sounds: Normal breath sounds.  Chest:  Breasts:    Tanner Score is 5.     Right: Normal.     Left: Normal.  Abdominal:     General: Bowel sounds are normal.     Palpations: Abdomen is soft.  Genitourinary:    Comments: Deferred  Musculoskeletal:        General: Normal range of motion.     Cervical back: Normal range of motion.  Feet:     Right foot:     Protective Sensation: 5 sites tested.  5 sites sensed.     Skin integrity: Dry skin present.     Toenail Condition: Right toenails are normal.     Left foot:     Protective Sensation: 5 sites tested.  5 sites sensed.     Skin integrity: Dry skin present.     Toenail Condition: Left toenails are normal.     Comments: White, horizontal lines on great toenail Skin:    General: Skin is warm.     Comments: Scattered areas of hyperpigmented, stuck on plaques  Neurological:     General: No focal deficit present.     Mental Status: She is alert.  Psychiatric:        Mood and Affect: Mood normal.        Behavior: Behavior normal.         Assessment And Plan:     Encounter for general adult medical examination w/o abnormal findings Assessment & Plan: A full exam was performed.  Importance of monthly self breast exams was discussed with the patient.  She is advised to get 3045 minutes of regular exercise, no less than  four to five days pe-r week. Both  weight-bearing and aerobic exercises are recommended.  She is advised to follow a healthy diet with at least six fruits/veggies per day, decrease intake of red meat and other saturated fats and to increase fish intake to twice weekly.  Meats/fish should not be fried -- baked, boiled or broiled is preferable. It is also important to cut back on your sugar intake.  Be sure to read labels - try to avoid anything with added sugar, high fructose corn syrup or other sweeteners.  If you must use a sweetener, you can try stevia or monkfruit.  It is also important to avoid artificially sweetened foods/beverages and diet drinks. Lastly, wear SPF 50 sunscreen on exposed skin and when in direct sunlight for an extended period of time.  Be sure to avoid fast food restaurants and aim for at least 60 ounces of water  daily.       Type 2 diabetes mellitus with stage 3b chronic kidney disease, without long-term current use of insulin  (HCC) Assessment & Plan: Chronic, diabetic foot exam was performed.    Orders: -     Hemoglobin A1c  Hypertensive heart and renal disease with renal failure, stage 1 through stage 4 or unspecified chronic kidney disease, without heart failure Assessment & Plan: Chronic, well controlled.     Paroxysmal atrial fibrillation (HCC) Assessment & Plan: Chronic, and paroxysmal.  She is on Eliquis  and metoprolol . She is currently in sinus rhythm.    Other systemic lupus erythematosus with other organ involvement (HCC)  Primary hypothyroidism -     TSH + free T4  Seborrheic keratoses -     Ambulatory referral to Dermatology  Diverticular disease of colon  History of gross hematuria  Obesity, morbid (HCC)  History of gout -     Uric acid   Assessment & Plan  Return for 1 year hm, 4 month dm f/u.SABRA Patient was given opportunity to ask questions. Patient verbalized understanding of the plan and was able to repeat key elements of the plan. All questions were answered to their  satisfaction.   I, Hannah LOISE Slocumb, MD, have reviewed all documentation for this visit. The documentation on 08/09/24 for the exam, diagnosis, procedures, and orders are all accurate and complete.

## 2024-08-09 NOTE — Patient Instructions (Signed)

## 2024-08-09 NOTE — Telephone Encounter (Addendum)
 Dr. Tony Hun  Feraheme  is non preferred and will be denied if patient has not failed preferred medications. Preferred medication is Venofer .  Would you like to try Venofer ?  Auth Submission: NO AUTH NEEDED Site of care: Site of care: [facility] Payer: [employer] MEDICARE Medication & CPT/J Code(s) submitted: Feraheme  (ferumoxytol ) [id number] Diagnosis Code: D50.9 Route of submission (phone, fax, portal):  Phone # Fax # Auth type: Buy/Bill PB Units/visits requested: X2 Reference number:  Approval from: 08/09/24 to 09/27/24

## 2024-08-10 LAB — HEMOGLOBIN A1C
Est. average glucose Bld gHb Est-mCnc: 100 mg/dL
Hgb A1c MFr Bld: 5.1 % (ref 4.8–5.6)

## 2024-08-10 LAB — URIC ACID: Uric Acid: 5.8 mg/dL (ref 3.1–7.9)

## 2024-08-10 LAB — TSH+FREE T4
Free T4: 1.32 ng/dL (ref 0.82–1.77)
TSH: 5.24 u[IU]/mL — ABNORMAL HIGH (ref 0.450–4.500)

## 2024-08-11 ENCOUNTER — Ambulatory Visit (HOSPITAL_COMMUNITY)

## 2024-08-11 ENCOUNTER — Ambulatory Visit (HOSPITAL_COMMUNITY)
Admission: RE | Admit: 2024-08-11 | Discharge: 2024-08-11 | Disposition: A | Discharge status: Home / Self Care | Attending: Gastroenterology | Admitting: Gastroenterology

## 2024-08-11 ENCOUNTER — Encounter (HOSPITAL_COMMUNITY): Payer: Self-pay | Admitting: Gastroenterology

## 2024-08-11 ENCOUNTER — Encounter (HOSPITAL_COMMUNITY)
Admission: RE | Disposition: A | Discharge status: Home / Self Care | Payer: Self-pay | Source: Home / Self Care | Attending: Gastroenterology

## 2024-08-11 ENCOUNTER — Other Ambulatory Visit: Payer: Self-pay

## 2024-08-11 DIAGNOSIS — E039 Hypothyroidism, unspecified: Secondary | ICD-10-CM | POA: Insufficient documentation

## 2024-08-11 DIAGNOSIS — K449 Diaphragmatic hernia without obstruction or gangrene: Secondary | ICD-10-CM

## 2024-08-11 DIAGNOSIS — J45909 Unspecified asthma, uncomplicated: Secondary | ICD-10-CM | POA: Insufficient documentation

## 2024-08-11 DIAGNOSIS — K222 Esophageal obstruction: Secondary | ICD-10-CM | POA: Diagnosis not present

## 2024-08-11 DIAGNOSIS — D509 Iron deficiency anemia, unspecified: Secondary | ICD-10-CM | POA: Insufficient documentation

## 2024-08-11 DIAGNOSIS — M1990 Unspecified osteoarthritis, unspecified site: Secondary | ICD-10-CM | POA: Insufficient documentation

## 2024-08-11 DIAGNOSIS — I4891 Unspecified atrial fibrillation: Secondary | ICD-10-CM | POA: Insufficient documentation

## 2024-08-11 DIAGNOSIS — I129 Hypertensive chronic kidney disease with stage 1 through stage 4 chronic kidney disease, or unspecified chronic kidney disease: Secondary | ICD-10-CM | POA: Diagnosis not present

## 2024-08-11 DIAGNOSIS — R195 Other fecal abnormalities: Secondary | ICD-10-CM | POA: Diagnosis present

## 2024-08-11 DIAGNOSIS — E119 Type 2 diabetes mellitus without complications: Secondary | ICD-10-CM

## 2024-08-11 DIAGNOSIS — E785 Hyperlipidemia, unspecified: Secondary | ICD-10-CM | POA: Insufficient documentation

## 2024-08-11 DIAGNOSIS — N183 Chronic kidney disease, stage 3 unspecified: Secondary | ICD-10-CM | POA: Diagnosis not present

## 2024-08-11 DIAGNOSIS — I10 Essential (primary) hypertension: Secondary | ICD-10-CM

## 2024-08-11 DIAGNOSIS — K31811 Angiodysplasia of stomach and duodenum with bleeding: Secondary | ICD-10-CM | POA: Insufficient documentation

## 2024-08-11 DIAGNOSIS — Z833 Family history of diabetes mellitus: Secondary | ICD-10-CM | POA: Insufficient documentation

## 2024-08-11 DIAGNOSIS — D631 Anemia in chronic kidney disease: Secondary | ICD-10-CM | POA: Diagnosis not present

## 2024-08-11 DIAGNOSIS — E1122 Type 2 diabetes mellitus with diabetic chronic kidney disease: Secondary | ICD-10-CM | POA: Diagnosis not present

## 2024-08-11 HISTORY — PX: ENTEROSCOPY: SHX5533

## 2024-08-11 SURGERY — ENTEROSCOPY
Anesthesia: Monitor Anesthesia Care

## 2024-08-11 MED ORDER — SODIUM CHLORIDE 0.9 % IV SOLN: INTRAVENOUS | Status: DC

## 2024-08-11 MED ORDER — PROPOFOL 500 MG/50ML IV EMUL
INTRAVENOUS | Status: DC | PRN
  Administered 2024-08-11: 150 ug/kg/min via INTRAVENOUS
  Administered 2024-08-11: 80 mg via INTRAVENOUS

## 2024-08-11 NOTE — Anesthesia Postprocedure Evaluation (Signed)
 Anesthesia Post Note  Patient: Hannah [name]  Procedure(s) Performed: ENTEROSCOPY     Patient location during evaluation: PACU Anesthesia Type: MAC Level of consciousness: awake and alert Pain management: pain level controlled Vital Signs Assessment: post-procedure vital signs reviewed and stable Respiratory status: spontaneous breathing, nonlabored ventilation, respiratory function stable and patient connected to nasal cannula oxygen  Cardiovascular status: stable and blood pressure returned to baseline Postop Assessment: no apparent nausea or vomiting Anesthetic complications: no   No notable events documented.  Last Vitals:  Vitals:   08/11/24 1020 08/11/24 1030  BP: (!) 138/59 132/79  Pulse: (!) 58 (!) 58  Resp: 18 18  Temp:    SpO2: 96% 95%    Last Pain:  Vitals:   08/11/24 1030  TempSrc:   PainSc: 0-No pain                 Thom JONELLE Peoples

## 2024-08-11 NOTE — Anesthesia Preprocedure Evaluation (Signed)
 Anesthesia Evaluation  Patient identified by MRN, date of birth, ID band Patient awake    Reviewed: Allergy  & Precautions, H&P , NPO status , Patient's Chart, lab work & pertinent test results  Airway Mallampati: II  TM Distance: >3 FB Neck ROM: Full    Dental no notable dental hx.    Pulmonary asthma , neg sleep apnea, neg COPD   Pulmonary exam normal breath sounds clear to auscultation       Cardiovascular hypertension, (-) angina (-) Past MI Normal cardiovascular exam+ dysrhythmias Atrial Fibrillation  Rhythm:Regular Rate:Normal  02/2021 IMPRESSIONS     1. Left ventricular ejection fraction, by estimation, is 55 to 60%. The  left ventricle has normal function. The left ventricle has no regional  wall motion abnormalities. There is mild asymmetric left ventricular  hypertrophy of the basal-septal segment.  Left ventricular diastolic parameters are consistent with Grade I  diastolic dysfunction (impaired relaxation).   2. Right ventricular systolic function is normal. The right ventricular  size is normal. There is normal pulmonary artery systolic pressure. The  estimated right ventricular systolic pressure is 27.2 mmHg.   3. The mitral valve is grossly normal. Trivial mitral valve  regurgitation. No evidence of mitral stenosis.   4. The aortic valve is tricuspid. There is mild calcification of the  aortic valve. Aortic valve regurgitation is not visualized. Mild aortic  valve sclerosis is present, with no evidence of aortic valve stenosis.   5. The inferior vena cava is normal in size with greater than 50%  respiratory variability, suggesting right atrial pressure of 3 mmHg.     Neuro/Psych neg Seizures negative neurological ROS  negative psych ROS   GI/Hepatic Neg liver ROS, PUD,GERD  ,,  Endo/Other  diabetes, Type 2Hypothyroidism    Renal/GU CRFRenal disease  negative genitourinary   Musculoskeletal  (+)  Arthritis ,    Abdominal   Peds negative pediatric ROS (+)  Hematology  (+) Blood dyscrasia, anemia   Anesthesia Other Findings   Reproductive/Obstetrics negative OB ROS                              Anesthesia Physical Anesthesia Plan  ASA: 3  Anesthesia Plan: MAC   Post-op Pain Management:    Induction: Intravenous  PONV Risk Score and Plan: 2 and Propofol  infusion and Treatment may vary due to age or medical condition  Airway Management Planned: Natural Airway  Additional Equipment:   Intra-op Plan:   Post-operative Plan:   Informed Consent: I have reviewed the patients History and Physical, chart, labs and discussed the procedure including the risks, benefits and alternatives for the proposed anesthesia with the patient or authorized representative who has indicated his/her understanding and acceptance.     Dental advisory given  Plan Discussed with: CRNA  Anesthesia Plan Comments:          Anesthesia Quick Evaluation

## 2024-08-11 NOTE — H&P (Signed)
 Hannah [name] HPI: This 78 year old black female presents to the office for further evaluation of positive fecal occult test done 05/23/2024 with her nephrologist, Dr Ephriam Stank. She has 1 BM per day. She denies having any rectal pain. She has not noticed any rectal bleeding. She has a history of chronic iron  deficiency anemia. She had labs done 05/23/2024 that revealed a hemoglobin of 8.5 gm/dl and an Iron  of 12. She has been fatigued. Her nephrologist is setting her up to have 5 iron  infusions over the next few weeks. She has CKD Stage 3 and waiting to schedule a renal biopsy. She also is being followed by Dr. Onesimo [hematologist/oncologist] for iron  deficiency anemia. She takes Pantoprazole  for acid reflux with good control. She has good appetite and her weight has been stable. She denies having any complaints of abdominal pain, nausea, vomiting, dysphagia or odynophagia. She denies having a family history of colon cancer or IBD. Her sister has celiac sprue. Her last colonoscopy was done on 09/27/2023 when scattered diverticula noted throughout the colon. She had an EGD on 09/08/28/2024 when a duodenal ulcer was diagnosed.  Past Medical History:  Diagnosis Date   Allergy  25 years ago.   Anemia    Arthritis    Asthma    Atrial fibrillation (HCC)    Cataract Had cataracts surgery 2015 & 2016   Chronic kidney disease    Diabetes mellitus without complication (HCC)    Gout 12/17/2014   patient reported   Heart murmur 4 years ago   Hyperlipidemia    Hypertension    Hypothyroidism    Systemic lupus erythematosus (HCC)    Ulcer    Vitiligo     Past Surgical History:  Procedure Laterality Date   BIOPSY  09/26/2023   Procedure: BIOPSY;  Surgeon: Aneita Gwendlyn DASEN, MD;  Location: [facility];  Service: Gastroenterology;;   CATARACT EXTRACTION Bilateral 2015   COLONOSCOPY WITH PROPOFOL  N/A 09/27/2023   Procedure: COLONOSCOPY WITH PROPOFOL ;  Surgeon: Kristie Lamprey, MD;  Location: [facility];   Service: Gastroenterology;  Laterality: N/A;   DILATION AND CURETTAGE OF UTERUS     DOPPLER ECHOCARDIOGRAPHY  05/2018   Internist to review with pt; potential heart murmur 06/20/18   ESOPHAGOGASTRODUODENOSCOPY (EGD) WITH PROPOFOL  N/A 09/26/2023   Procedure: ESOPHAGOGASTRODUODENOSCOPY (EGD) WITH PROPOFOL ;  Surgeon: Aneita Gwendlyn DASEN, MD;  Location: [facility];  Service: Gastroenterology;  Laterality: N/A;   EYE SURGERY  Cataract Surgery, 2015 & 2016.   keratosis removal  2021   KNEE CLOSED REDUCTION Left 07/06/2022   Procedure: CLOSED MANIPULATION KNEE;  Surgeon: Melodi Lerner, MD;  Location: [facility];  Service: Orthopedics;  Laterality: Left;   KNEE CLOSED REDUCTION Right 09/13/2023   Procedure: CLOSED MANIPULATION KNEE;  Surgeon: Melodi Lerner, MD;  Location: [facility];  Service: Orthopedics;  Laterality: Right;   REPLACEMENT TOTAL KNEE Left    SKIN SURGERY  11/30/2018   left side of face   TOOTH EXTRACTION     TOTAL KNEE ARTHROPLASTY Left 02/02/2022   Procedure: TOTAL KNEE ARTHROPLASTY;  Surgeon: Melodi Lerner, MD;  Location: [facility];  Service: Orthopedics;  Laterality: Left;   TOTAL KNEE ARTHROPLASTY Right 07/12/2023   Procedure: RIGHT TOTAL KNEE ARTHROPLASTY;  Surgeon: Melodi Lerner, MD;  Location: [facility];  Service: Orthopedics;  Laterality: Right;    Family History  Problem Relation Age of Onset   Heart disease Father    Diabetes Father    Hypertension Mother    Obesity Mother  Varicose Veins Mother    Hypertension Sister    Hypertension Sister    Leukemia Sister    Cancer Sister    Obesity Sister     Social History:  reports that she has never smoked. She has never been exposed to tobacco smoke. She has never used smokeless tobacco. She reports that she does not currently use alcohol . She reports that she does not use drugs.  Allergies:  Allergies  Allergen Reactions   Shellfish Allergy  Anaphylaxis, Hives and Other (See Comments)   Shellfish Protein-Containing Drug  Products Anaphylaxis and Other (See Comments)    Medications: Scheduled: Continuous:  sodium chloride       No results found. However, due to the size of the patient record, not all encounters were searched. Please check Results Review for a complete set of results.   No results found.  ROS:  As stated above in the HPI otherwise negative.  Weight 72 kg.    PE: Gen: NAD, Alert and Oriented HEENT:  [place]/AT, EOMI Neck: Supple, no LAD Lungs: CTA Bilaterally CV: RRR without M/G/R ABD: Soft, NTND, +BS Ext: No C/C/E  Assessment/Plan: 1) Heme positive stool. 2) IDA. 3) Family history of Celiac disease. 4) History of DU.  Plan: 1) Enteroscopy.  Cylah Fannin D 08/11/2024, 8:17 AM

## 2024-08-11 NOTE — Op Note (Signed)
 [facility] Patient Name: Hannah [name] Procedure Date: 08/11/2024 MRN: [id number] Attending MD: Belvie Just , MD, [id number] Date of Birth: Aug 22, 1946 CSN: [id number] Age: 78 Admit Type: Inpatient Procedure:                Small bowel enteroscopy Indications:              Iron  deficiency anemia Providers:                Belvie Just, MD, Hoy Penner, RN, Felice Sar,                            Technician Referring MD:              Medicines:                Propofol  per Anesthesia Complications:            No immediate complications. Estimated Blood Loss:     Estimated blood loss: none. Procedure:                Pre-Anesthesia Assessment:                           - Prior to the procedure, a History and Physical                            was performed, and patient medications and                            allergies were reviewed. The patient's tolerance of                            previous anesthesia was also reviewed. The risks                            and benefits of the procedure and the sedation                            options and risks were discussed with the patient.                            All questions were answered, and informed consent                            was obtained. Prior Anticoagulants: The patient has                            taken Eliquis  (apixaban ), last dose was 2 days                            prior to procedure. ASA Grade Assessment: III - A                            patient with severe systemic disease. After  reviewing the risks and benefits, the patient was                            deemed in satisfactory condition to undergo the                            procedure.                           - Sedation was administered by an anesthesia                            professional. Deep sedation was attained.                           After obtaining informed consent, the endoscope was                             passed under direct vision. Throughout the                            procedure, the patient's blood pressure, pulse, and                            oxygen  saturations were monitored continuously. The                            [id number]-[id number] ([id number]) Olympus colonoscope was                            introduced through the mouth and advanced to the                            small bowel distal to the Ligament of Treitz. The                            small bowel enteroscopy was accomplished without                            difficulty. The patient tolerated the procedure                            well. Scope In: Scope Out: Findings:      A 2 cm hiatal hernia was present.      One benign-appearing, intrinsic mild stenosis was found at the       gastroesophageal junction. This stenosis measured 1.5 cm (inner       diameter) x less than one cm (in length). The stenosis was traversed.      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 Coagulation for hemostasis using monopolar probe was       successful.      There was no evidence of significant pathology in the entire examined       duodenum.      There was no evidence of  significant pathology in the mid-jejunum.      Deep intubation of the small bowel was achieved. There was no evidence       of any bleeding source in the duodenum, proximal and mid jejunum. 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       Hematin was noted as well as some minimal active bleeding. The area was       ablated with APC. Impression:               - 2 cm hiatal hernia.                           - Benign-appearing esophageal stenosis.                           - Gastric antral vascular ectasia with bleeding.                            Treated with a monopolar probe.                           - Normal examined duodenum.                           - The  examined portion of the jejunum was normal.                           - No specimens collected. Recommendation:           - Patient has a contact number available for                            emergencies. The signs and symptoms of potential                            delayed complications were discussed with the                            patient. Return to normal activities tomorrow.                            Written discharge instructions were provided to the                            patient.                           - Resume regular diet.                           - Repeat the small bowel enteroscopy in 4 weeks for                            retreatment.                           - Okay to  restart Eliquis . Procedure Code(s):        --- Professional ---                           [phone], Small intestinal endoscopy, enteroscopy                            beyond second portion of duodenum, not including                            ileum; with control of bleeding (eg, injection,                            bipolar cautery, unipolar cautery, laser, heater                            probe, stapler, plasma coagulator) Diagnosis Code(s):        --- Professional ---                           K31.811, Angiodysplasia of stomach and duodenum                            with bleeding                           K44.9, Diaphragmatic hernia without obstruction or                            gangrene                           K22.2, Esophageal obstruction                           D50.9, Iron  deficiency anemia, unspecified CPT copyright 2022 American Medical Association. All rights reserved. The codes documented in this report are preliminary and upon coder review may  be revised to meet current compliance requirements. Belvie Just, MD Belvie Just, MD 08/11/2024 10:16:12 AM This report has been signed electronically. Number of Addenda: 0

## 2024-08-11 NOTE — Transfer of Care (Signed)
 Immediate Anesthesia Transfer of Care Note  Patient: Hannah [name]  Procedure(s) Performed: ENTEROSCOPY  Patient Location: PACU  Anesthesia Type:MAC  Level of Consciousness: awake, alert , and oriented  Airway & Oxygen  Therapy: Patient Spontanous Breathing  Post-op Assessment: Report given to RN and Post -op Vital signs reviewed and stable  Post vital signs: Reviewed and stable  Last Vitals:  Vitals Value Taken Time  BP 132/79 08/11/24 10:30  Temp 36.5 C 08/11/24 10:10  Pulse 57 08/11/24 10:32  Resp 18 08/11/24 10:32  SpO2 96 % 08/11/24 10:32  Vitals shown include unfiled device data.  Last Pain:  Vitals:   08/11/24 1030  TempSrc:   PainSc: 0-No pain         Complications: No notable events documented.

## 2024-08-11 NOTE — Discharge Instructions (Signed)

## 2024-08-13 ENCOUNTER — Encounter (HOSPITAL_COMMUNITY): Payer: Self-pay | Admitting: Gastroenterology

## 2024-08-13 DIAGNOSIS — I1310 Hypertensive heart and chronic kidney disease without heart failure, with stage 1 through stage 4 chronic kidney disease, or unspecified chronic kidney disease: Secondary | ICD-10-CM | POA: Insufficient documentation

## 2024-08-13 NOTE — Assessment & Plan Note (Signed)
 Chronic, and paroxysmal.  She is on Eliquis  and metoprolol . She is currently in sinus rhythm.

## 2024-08-13 NOTE — Assessment & Plan Note (Signed)

## 2024-08-13 NOTE — Assessment & Plan Note (Signed)
 Chronic, well controlled.  She will continue with metoprolol  XL 25mg  daily and irbesartan  300mg  daily.  - She is encouraged to follow low sodium diet.  - She will f/u in 4 months for re-evaluation.

## 2024-08-13 NOTE — Assessment & Plan Note (Signed)
 Chronic, diabetic foot exam was performed.  She will continue with Farxiga  10mg  daily.  - I DISCUSSED WITH THE PATIENT AT LENGTH REGARDING THE GOALS OF GLYCEMIC CONTROL AND POSSIBLE LONG-TERM COMPLICATIONS.  I  ALSO STRESSED THE IMPORTANCE OF COMPLIANCE WITH HOME GLUCOSE MONITORING, DIETARY RESTRICTIONS INCLUDING AVOIDANCE OF SUGARY DRINKS/PROCESSED FOODS,  ALONG WITH REGULAR EXERCISE.  I  ALSO STRESSED THE IMPORTANCE OF ANNUAL EYE EXAMS, SELF FOOT CARE AND COMPLIANCE WITH OFFICE VISITS.

## 2024-08-14 ENCOUNTER — Ambulatory Visit (HOSPITAL_COMMUNITY)
Admission: RE | Admit: 2024-08-14 | Discharge: 2024-08-14 | Disposition: A | Discharge status: Home / Self Care | Source: Ambulatory Visit | Attending: Cardiology | Admitting: Cardiology

## 2024-08-14 ENCOUNTER — Encounter: Payer: Self-pay | Admitting: Hematology

## 2024-08-14 ENCOUNTER — Encounter (HOSPITAL_COMMUNITY): Payer: Self-pay | Admitting: Rheumatology

## 2024-08-14 ENCOUNTER — Telehealth: Payer: Self-pay | Admitting: Pharmacist

## 2024-08-14 DIAGNOSIS — R011 Cardiac murmur, unspecified: Secondary | ICD-10-CM | POA: Diagnosis present

## 2024-08-14 DIAGNOSIS — I10 Essential (primary) hypertension: Secondary | ICD-10-CM | POA: Insufficient documentation

## 2024-08-14 DIAGNOSIS — Z79899 Other long term (current) drug therapy: Secondary | ICD-10-CM

## 2024-08-14 LAB — ECHOCARDIOGRAM COMPLETE
AV Area VTI: 1.72 cm2
AV Mean grad: 14 mmHg
Area-P 1/2: 5.79 cm2
S' Lateral: 2.24 cm

## 2024-08-14 NOTE — Progress Notes (Signed)
   08/14/2024  Patient ID: Hannah [name], female   DOB: 1946-06-18, 78 y.o.   MRN: [id number]  Patient was called to follow up on medication assistance. HIPAA identifiers were obtained.  Patient's application for Synthroid  through [employer] was submitted on 07/31/24.  She completed her portion of the [place] application for Farxiga .  The Provider portion was completed and sent 07/28/2024.  Patient said she had not heard anything from [employer].  Lab Results  Component Value Date   HGBA1C 5.1 08/09/2024   HGBA1C 5.1 03/15/2024   HGBA1C 4.8 09/13/2023    Plan: [name], CPhT will follow up on her patient assistance process from here.   Cassius DOROTHA Brought, PharmD, BCACP Clinical Pharmacist [phone]

## 2024-08-15 DIAGNOSIS — L821 Other seborrheic keratosis: Secondary | ICD-10-CM | POA: Insufficient documentation

## 2024-08-15 NOTE — Assessment & Plan Note (Signed)
 I will refer her to Derm for further evaluation as requested. Some lesions are more bothersome than others, she is interested in removal.

## 2024-08-15 NOTE — Assessment & Plan Note (Signed)
 Chronic, she is currently taking Synthroid  75mcg M-Fri and on Sat/Sun.  - I will check thyroid  panel and adjust meds as needed.

## 2024-08-15 NOTE — Assessment & Plan Note (Signed)
 She is encouraged to strive for BMI less than 30 to decrease cardiac risk. Advised to aim for at least 150 minutes of exercise per week.

## 2024-08-15 NOTE — Assessment & Plan Note (Signed)
 Chronic, she is followed by Rheumatology.  - Continue with hydroxychloroquine .  - Encouraged to have regular eye exams.

## 2024-08-16 ENCOUNTER — Other Ambulatory Visit: Payer: Self-pay

## 2024-08-16 ENCOUNTER — Ambulatory Visit: Payer: Self-pay | Admitting: Internal Medicine

## 2024-08-16 NOTE — Patient Outreach (Signed)
 Complex Care Management   Visit Note  08/16/2024  Name:  Hannah [name] MRN: [id number] DOB: Aug 09, 1946  Situation: Referral received for Complex Care Management related to RA, OA, SLE, HTN, Hypothyroidism, CKD, s/p right TKA, Iron  deficiency Anemia. I obtained verbal consent from Patient.  Visit completed with Patient on the phone.  Background:   Past Medical History:  Diagnosis Date   Allergy  25 years ago.   Anemia    Arthritis    Asthma    Atrial fibrillation (HCC)    Cataract Had cataracts surgery 2015 & 2016   Chronic kidney disease    Diabetes mellitus without complication (HCC)    Gout 12/17/2014   patient reported   Heart murmur 4 years ago   Hyperlipidemia    Hypertension    Hypothyroidism    Systemic lupus erythematosus (HCC)    Ulcer    Vitiligo     Assessment: Patient Reported Symptoms:  Cognitive Cognitive Status: Alert and oriented to person, place, and time, Normal speech and language skills Cognitive/Intellectual Conditions Management [RPT]: None reported or documented in medical history or problem list   Health Maintenance Behaviors: Healthy diet, Annual physical exam, Immunizations, Spiritual practice(s), Social activities, Sleep adequate Healing Pattern: Average Health Facilitated by: Healthy diet, Rest  Neurological Neurological Review of Symptoms: No symptoms reported    HEENT HEENT Symptoms Reported: No symptoms reported      Cardiovascular Cardiovascular Symptoms Reported: Fatigue Does patient have uncontrolled Hypertension?: No Cardiovascular Management Strategies: Medication therapy, Adequate rest, Routine screening Cardiovascular Self-Management Outcome: 4 (good)  Respiratory Respiratory Symptoms Reported: No symptoms reported    Endocrine Endocrine Symptoms Reported: No symptoms reported Is patient diabetic?: No    Gastrointestinal Gastrointestinal Symptoms Reported: Other Other Gastrointestinal Symptoms: Recent GI bleed, repaired  during with upper Endoscopy per Dr. Rollin; will have repeat procedure in approximately 4 weeks Gastrointestinal Management Strategies:  (Routine Screening) Gastrointestinal Self-Management Outcome: 4 (good)    Genitourinary Genitourinary Symptoms Reported: No symptoms reported    Integumentary Integumentary Symptoms Reported: Skin changes Additional Integumentary Details: patient referred to Dermatology for evaluation of moles/keloids Skin Management Strategies: Routine screening Skin Self-Management Outcome: 4 (good)  Musculoskeletal Musculoskelatal Symptoms Reviewed: Limited mobility Additional Musculoskeletal Details: s/p right TKR; mobility and ROM have improved Musculoskeletal Management Strategies: Medical device, Routine screening Musculoskeletal Self-Management Outcome: 4 (good) Falls in the past year?: No Number of falls in past year: 1 or less Was there an injury with Fall?: No Fall Risk Category Calculator: 0 Patient Fall Risk Level: Low Fall Risk Patient at Risk for Falls Due to: Impaired mobility Fall risk Follow up: Falls evaluation completed, Education provided  Psychosocial Psychosocial Symptoms Reported: No symptoms reported   Major Change/Loss/Stressor/Fears (CP): Medical condition, self Techniques to Cope with Loss/Stress/Change: Diversional activities, Spiritual practice(s) Quality of Family Relationships: helpful, involved, supportive Do you feel physically threatened by others?: No    08/16/2024    PHQ2-9 Depression Screening   Aleighya Mcanelly interest or pleasure in doing things    Feeling down, depressed, or hopeless    PHQ-2 - Total Score    Trouble falling or staying asleep, or sleeping too much    Feeling tired or having Nolia Tschantz energy    Poor appetite or overeating     Feeling bad about yourself - or that you are a failure or have let yourself or your family down    Trouble concentrating on things, such as reading the newspaper or watching television    Moving  or speaking  so slowly that other people could have noticed.  Or the opposite - being so fidgety or restless that you have been moving around a lot more than usual    Thoughts that you would be better off dead, or hurting yourself in some way    PHQ2-9 Total Score    If you checked off any problems, how difficult have these problems made it for you to do your work, take care of things at home, or get along with other people    Depression Interventions/Treatment      There were no vitals filed for this visit. Pain Scale: Not given for pain  Medications Reviewed Today     Reviewed by Morgan Clayborne CROME, RN (Registered Nurse) on 08/16/24 at 1112  Med List Status: <None>   Medication Order Taking? Sig Documenting Provider Last Dose Status Informant  Accu-Chek Softclix Lancets lancets [id number] No Use to check blood sugars daily E11.69 Jarold Medici, MD 08/10/2024 Active Self, Pharmacy Records  albuterol  (PROVENTIL  HFA;VENTOLIN  HFA) 108 (90 Base) MCG/ACT inhaler [id number] No Inhale 2 puffs into the lungs every 6 (six) hours as needed for wheezing or shortness of breath. Georgina Speaks, FNP More than a month Active Self, Pharmacy Records           Med Note (Kholton Coate, CLAYBORNE CROME   Thu Feb 10, 2024  9:36 AM)    Alcohol  Swabs (ALCOHOL  PADS) 70 % PADS [id number] No Use as directed to check blood sugars 1 time per day dx: e11.22 Jarold Medici, MD 08/10/2024 Active Self, Pharmacy Records  allopurinol  (ZYLOPRIM ) 100 MG tablet [id number] No TAKE 1 TABLET BY MOUTH 3 TIMES A WEEK Sanders, Robyn, MD 08/10/2024 Active Self, Pharmacy Records  amLODipine  (NORVASC ) 5 MG tablet [id number] No TAKE 1 TABLET(5 MG) BY MOUTH AT BEDTIME Jarold Medici, MD 08/11/2024 Morning Active Self, Pharmacy Records  apixaban  (ELIQUIS ) 5 MG TABS tablet [id number] No Take 1 tablet (5 mg total) by mouth 2 (two) times daily. Nahser, Aleene PARAS, MD 08/09/2024 Active Self, Pharmacy Records  Ascorbic Acid (VITAMIN C PO) [id number] No Take 500 mg by mouth  daily with lunch. [provider] 08/10/2024 Active Self, Pharmacy Records  atorvastatin  (LIPITOR) 80 MG tablet [id number] No TAKE 1 TABLET BY MOUTH EVERY DAY Jarold Medici, MD 08/11/2024 Morning Active   B Complex-C (B-COMPLEX WITH VITAMIN C) tablet [id number] No Take 1 tablet by mouth daily with lunch. [provider] 08/10/2024 Active Self, Pharmacy Records  budesonide -formoterol  (SYMBICORT ) 160-4.5 MCG/ACT inhaler [id number] No INHALE 2 PUFFS BY MOUTH TWICE DAILY IN THE MORNING AND IN THE EVENING  Patient taking differently: Inhale 2 puffs into the lungs as needed. INHALE 2 PUFFS BY MOUTH TWICE DAILY IN THE MORNING AND IN THE KARNA Jarold Medici, MD More than a month Active Self, Pharmacy Records  Calcium  Carb-Cholecalciferol 600-800 MG-UNIT TABS [id number] No Take 1 tablet by mouth daily with lunch. [provider] 08/10/2024 Active Self, Pharmacy Records  Cholecalciferol (VITAMIN D  PO) [id number] No Take 2,000 Units by mouth daily with lunch. [provider] 08/10/2024 Active Self, Pharmacy Records           Med Note (Irisha Grandmaison L   Thu Feb 10, 2024  9:35 AM)    dapagliflozin  propanediol (FARXIGA ) 10 MG TABS tablet [id number] No Take 1 tablet (10 mg total) by mouth daily before breakfast. Jarold Medici, MD 08/11/2024 Morning Active   denosumab  (PROLIA ) 60 MG/ML SOSY injection [id number]  Inject 60 mg into the skin every 6 (six)  months. Courier to rheum: [address], [place] [address]. Appt on 12/07/2022 Dolphus Reiter, MD  Expired 08/09/24 2359 Self, Pharmacy Records           Med Note EFRAIM, ALFREIDA CROME   Tue Jul 04, 2024  6:11 PM) Patient stated her next shot is in January  glucose blood (ACCU-CHEK GUIDE) test strip [id number] No USE TO CHECK BLOOD SUGAR DAILY Jarold Medici, MD 08/10/2024 Active Self, Pharmacy Records  hydroxychloroquine  (PLAQUENIL ) 200 MG tablet [id number] No TAKE 1 TABLET BY MOUTH TWICE DAILY, MONDAY THROUGH FRIDAY  COOPER Dolphus Reiter, MD 08/10/2024 Active   irbesartan  (AVAPRO ) 300 MG tablet [id number] No TAKE 1 TABLET(300 MG) BY MOUTH DAILY Jarold Medici, MD 08/10/2024 Active Self, Pharmacy Records  metoprolol  succinate (TOPROL -XL) 25 MG 24 hr tablet [id number] No Take 1 tablet (25 mg total) by mouth daily. Segal, Jared E, MD 08/11/2024 Morning Active   Multiple Vitamin (MULTIVITAMIN WITH MINERALS) TABS tablet [id number] No Take 1 tablet by mouth daily with lunch. Women's Multivitamin [provider] 08/10/2024 Active Self, Pharmacy Records  pantoprazole  (PROTONIX ) 40 MG tablet [id number] No Take 40 mg by mouth every morning. [provider] 08/11/2024 Morning Active Self, Pharmacy Records  Polyethyl Glycol-Propyl Glycol (SYSTANE) 0.4-0.3 % SOLN [id number] No Place 1-2 drops into both eyes 3 (three) times daily as needed (dry/irritated eyes.). [provider] 08/10/2024 Active Self, Pharmacy Records  SYNTHROID  75 MCG tablet [id number] No Take 1 tablet MONDAY through FRIDAY Sanders, Robyn, MD 08/11/2024 Morning Active   SYNTHROID  88 MCG tablet [id number] No TAKE 1 TABLET BY MOUTH EVERY SATURDAY AND SUNDAY Sanders, Robyn, MD Past Week Active             Recommendation:   Specialty provider follow-up    08/17/2024 Status: Sch   Time: 8:00 AM Length: 15  Visit Type: MED INJ 15 [458] Copay: $0.00  Provider: [facility] Department: [facility]    10/26/2024 Status: Sch   Time: 10:00 AM Length: 20  Visit Type: OFFICE VISIT [1004] Copay: $15.00  Provider: Deveshwar, Shaili, MD Department: [facility] [place]   Follow Up Plan:   Telephone follow up appointment date/time:     12 /15/2025 Status: Sch   Time: 10:30 AM Length: 30  Visit Type: VBCI TELEPHONE CALL 30 [2502] Copay: $0.00  Provider: Morgan Clayborne CROME, RN Department: [facility]   Clayborne Morgan RN BSN CCM [place]  [facility], [facility] Health Nurse Care Coordinator  Direct Dial:  [phone] Website: [email or web address]

## 2024-08-16 NOTE — Patient Instructions (Addendum)
 Visit Information  Thank you for taking time to visit with me today. Please don't hesitate to contact me if I can be of assistance to you before our next scheduled appointment.  Your next care management appointment is by telephone on Monday, December 15 at 10:30 AM  Please call the care guide team at [phone] if you need to cancel, schedule, or reschedule an appointment.   Please call the Suicide and Crisis Lifeline: 988 if you are experiencing a Mental Health or Behavioral Health Crisis or need someone to talk to.  Clayborne Ly RN BSN CCM [place]  [facility], [facility] Health Nurse Care Coordinator  Direct Dial: [phone] Website: [email or web address]

## 2024-08-17 ENCOUNTER — Ambulatory Visit (HOSPITAL_COMMUNITY)
Admission: RE | Admit: 2024-08-17 | Discharge: 2024-08-17 | Disposition: A | Discharge status: Home / Self Care | Source: Ambulatory Visit | Attending: Rheumatology | Admitting: Rheumatology

## 2024-08-17 ENCOUNTER — Ambulatory Visit: Payer: Self-pay | Admitting: Internal Medicine

## 2024-08-17 VITALS — BP 119/66 | HR 57 | Temp 97.4°F | Resp 16

## 2024-08-17 DIAGNOSIS — M810 Age-related osteoporosis without current pathological fracture: Secondary | ICD-10-CM | POA: Diagnosis not present

## 2024-08-17 MED ORDER — DENOSUMAB 60 MG/ML ~~LOC~~ SOSY
PREFILLED_SYRINGE | SUBCUTANEOUS | Status: AC
  Filled 2024-08-17: qty 1

## 2024-08-17 MED ORDER — DENOSUMAB 60 MG/ML ~~LOC~~ SOSY
60.0000 mg | PREFILLED_SYRINGE | Freq: Once | SUBCUTANEOUS | Status: AC
  Administered 2024-08-17: 60 mg via SUBCUTANEOUS

## 2024-08-18 ENCOUNTER — Other Ambulatory Visit (HOSPITAL_COMMUNITY): Payer: Self-pay | Admitting: Hematology

## 2024-08-18 ENCOUNTER — Telehealth: Payer: Self-pay

## 2024-08-18 NOTE — Telephone Encounter (Signed)
 Dr. Onesimo, patient will be scheduled as soon as possible.  Auth Submission: NO AUTH NEEDED Site of care: Site of care: [facility] Payer: [employer] medicare Medication & CPT/J Code(s) submitted: Venofer  (Iron  Sucrose) J1756 Diagnosis Code:  Route of submission (phone, fax, portal):  Phone # Fax # Auth type: Buy/Bill PB Units/visits requested: 300mg  x 3 doses Reference number:  Approval from: 08/18/24 to 09/27/24

## 2024-08-22 ENCOUNTER — Other Ambulatory Visit: Payer: Self-pay | Admitting: Nephrology

## 2024-08-22 DIAGNOSIS — N2889 Other specified disorders of kidney and ureter: Secondary | ICD-10-CM

## 2024-08-22 NOTE — Telephone Encounter (Signed)
 I called and spoke with the patient. I was able to give her the echo results and Dr. Ali recommendations. She understood and wanted me to send this to her via mychart as well. I will be able to do so and I let her know to reach out to us  if she needs anything.

## 2024-08-31 ENCOUNTER — Ambulatory Visit

## 2024-08-31 VITALS — BP 135/88 | HR 57 | Temp 98.3°F | Resp 16 | Ht 58.5 in | Wt 168.2 lb

## 2024-08-31 DIAGNOSIS — D508 Other iron deficiency anemias: Secondary | ICD-10-CM | POA: Diagnosis not present

## 2024-08-31 MED ORDER — IRON SUCROSE 300 MG IVPB - SIMPLE MED
300.0000 mg | Freq: Once | Status: AC
  Administered 2024-08-31: 300 mg via INTRAVENOUS
  Filled 2024-08-31: qty 265

## 2024-08-31 NOTE — Progress Notes (Signed)
 Diagnosis: Acute Anemia  Provider:  Lonna Coder MD  Procedure: IV Infusion  IV Type: Peripheral, IV Location: R Forearm  Venofer  (Iron  Sucrose), Dose: 300 mg  Infusion Start Time: 0945  Infusion Stop Time: 1132  Post Infusion IV Care: Observation period completed and Peripheral IV Discontinued  Discharge: Condition: Good, Destination: Home . AVS Provided  Performed by:  Donny Childes, RN

## 2024-09-06 ENCOUNTER — Other Ambulatory Visit: Payer: Self-pay | Admitting: Gastroenterology

## 2024-09-11 ENCOUNTER — Ambulatory Visit

## 2024-09-11 ENCOUNTER — Telehealth

## 2024-09-12 ENCOUNTER — Ambulatory Visit

## 2024-09-12 VITALS — BP 153/64 | HR 58 | Temp 97.6°F | Resp 18 | Ht 59.0 in | Wt 170.8 lb

## 2024-09-12 DIAGNOSIS — D508 Other iron deficiency anemias: Secondary | ICD-10-CM

## 2024-09-12 MED ORDER — IRON SUCROSE 300 MG IVPB - SIMPLE MED
300.0000 mg | Freq: Once | Status: AC
  Administered 2024-09-12: 08:00:00 300 mg via INTRAVENOUS
  Filled 2024-09-12: qty 265

## 2024-09-12 NOTE — Progress Notes (Signed)
 Diagnosis: Iron  Deficiency Anemia  Provider:  Mannam, Praveen MD  Procedure: IV Infusion  IV Type: Peripheral, IV Location: R Antecubital  Venofer  (Iron  Sucrose), Dose: 300 mg  Infusion Start Time: 0828  Infusion Stop Time: 1005  Post Infusion IV Care: Observation period completed and Peripheral IV Discontinued  Discharge: Condition: Good, Destination: Home . AVS Provided  Performed by:  Shelise Maron E, RN

## 2024-09-19 ENCOUNTER — Ambulatory Visit (INDEPENDENT_AMBULATORY_CARE_PROVIDER_SITE_OTHER)

## 2024-09-19 VITALS — BP 134/73 | HR 60 | Temp 98.3°F | Resp 18 | Ht 60.0 in | Wt 173.8 lb

## 2024-09-19 DIAGNOSIS — M810 Age-related osteoporosis without current pathological fracture: Secondary | ICD-10-CM | POA: Diagnosis not present

## 2024-09-19 DIAGNOSIS — D508 Other iron deficiency anemias: Secondary | ICD-10-CM | POA: Diagnosis not present

## 2024-09-19 MED ORDER — IRON SUCROSE 300 MG IVPB - SIMPLE MED
300.0000 mg | Freq: Once | Status: AC
  Administered 2024-09-19: 300 mg via INTRAVENOUS
  Filled 2024-09-19: qty 265

## 2024-09-19 MED ORDER — DENOSUMAB 60 MG/ML ~~LOC~~ SOSY: 60.0000 mg | PREFILLED_SYRINGE | Freq: Once | SUBCUTANEOUS | Status: DC

## 2024-09-19 NOTE — Progress Notes (Signed)
 Diagnosis: Iron  Deficiency Anemia  Provider:  Mannam, Praveen MD  Procedure: IV Infusion  IV Type: Peripheral, IV Location: R Antecubital  Venofer  (Iron  Sucrose), Dose: 300 mg  Infusion Start Time: 1003  Infusion Stop Time: 1142  Post Infusion IV Care: Observation period completed and Peripheral IV Discontinued  Discharge: Condition: Good, Destination: Home . AVS Provided  Performed by:  Rocky FORBES Sar, RN

## 2024-09-24 ENCOUNTER — Inpatient Hospital Stay: Admission: RE | Admit: 2024-09-24 | Source: Ambulatory Visit

## 2024-09-25 ENCOUNTER — Other Ambulatory Visit: Payer: Self-pay

## 2024-09-25 ENCOUNTER — Encounter: Payer: Self-pay | Admitting: Nephrology

## 2024-09-25 DIAGNOSIS — J4520 Mild intermittent asthma, uncomplicated: Secondary | ICD-10-CM

## 2024-09-25 MED ORDER — ALBUTEROL SULFATE HFA 108 (90 BASE) MCG/ACT IN AERS: 2.0000 | INHALATION_SPRAY | Freq: Four times a day (QID) | RESPIRATORY_TRACT | 2 refills | Status: AC | PRN

## 2024-09-25 MED ORDER — BUDESONIDE-FORMOTEROL FUMARATE 160-4.5 MCG/ACT IN AERO: INHALATION_SPRAY | RESPIRATORY_TRACT | 2 refills | Status: AC

## 2024-09-26 ENCOUNTER — Other Ambulatory Visit: Payer: Self-pay | Admitting: Internal Medicine

## 2024-09-26 LAB — HM DEXA SCAN

## 2024-09-29 ENCOUNTER — Ambulatory Visit
Admission: RE | Admit: 2024-09-29 | Discharge: 2024-09-29 | Disposition: A | Discharge status: Home / Self Care | Source: Ambulatory Visit | Attending: Nephrology | Admitting: Nephrology

## 2024-09-29 ENCOUNTER — Telehealth: Payer: Self-pay

## 2024-09-29 DIAGNOSIS — N2889 Other specified disorders of kidney and ureter: Secondary | ICD-10-CM

## 2024-09-29 MED ORDER — GADOPICLENOL 0.5 MMOL/ML IV SOLN
7.5000 mL | Freq: Once | INTRAVENOUS | Status: AC | PRN
  Administered 2024-09-29: 7.5 mL via INTRAVENOUS

## 2024-09-29 NOTE — Telephone Encounter (Signed)
 Received DEXA results from [facility]  Date of Scan: 09/26/2024  Lowest T-score: -2.7  BMD: 0.551  Lowest site measured: Left femoral neck  DX: Osteopenia  Significant changes in BMD and site measured (5% and above):N/A  Current Regimen:Vitamin D , Calcium , Prolia  (last injection 06/2024)  Recommendation: Osteoporosis-Stable. Continue Prolia .   Reviewed by: Waddell Craze, PA-C  Next Appointment: 10/26/2024.   Attempted to contact the patient and left a message to call the office back.

## 2024-09-30 ENCOUNTER — Other Ambulatory Visit: Payer: Self-pay | Admitting: Rheumatology

## 2024-09-30 DIAGNOSIS — M0579 Rheumatoid arthritis with rheumatoid factor of multiple sites without organ or systems involvement: Secondary | ICD-10-CM

## 2024-10-02 NOTE — Telephone Encounter (Signed)
 Patient advised Osteoporosis-Stable. Continue Prolia . Patient verbalized understanding.

## 2024-10-02 NOTE — Telephone Encounter (Signed)
 Last Fill: 07/10/2024  Eye exam: 02/16/2024   Labs: 08/01/2024 RBC 2.82, hemoglobin 8.8, HCT 27.7, eosinophils absolute 0.6, creatinine 1.56, GFR 34  Next Visit: 10/26/2024  Last Visit: 05/11/2024  IK:Nuyzm systemic lupus erythematosus with other organ involvement   Current Dose per office note on 05/11/2024: Plaquenil  200 mg 1 tablet by mouth twice daily Monday through Friday   Okay to refill Plaquenil ?

## 2024-10-03 ENCOUNTER — Ambulatory Visit: Payer: Self-pay | Admitting: Internal Medicine

## 2024-10-03 ENCOUNTER — Encounter: Payer: Self-pay | Admitting: Internal Medicine

## 2024-10-03 NOTE — Telephone Encounter (Signed)
 PAP: Patient assistance application for Farxiga  has been approved by PAP Companies: [employer] from 09/28/2024 to 09/27/2025. Medication should be delivered to PAP Delivery: Home. For further shipping updates, please contact [employer] ([employer]) at [phone]. Patient ID is: [id number]

## 2024-10-04 NOTE — Telephone Encounter (Signed)
 PAP: Patient assistance application for Synthroid  has been approved by PAP Companies: [employer] from 09/28/2024 to 09/27/2025. Medication should be delivered to PAP Delivery: Home. For further shipping updates, please contact [employer] ([employer]) at [phone]. Patient ID is: not provided- approval letter in media.

## 2024-10-06 ENCOUNTER — Other Ambulatory Visit: Payer: Self-pay

## 2024-10-06 ENCOUNTER — Other Ambulatory Visit: Payer: Self-pay | Admitting: Rheumatology

## 2024-10-06 ENCOUNTER — Encounter (HOSPITAL_COMMUNITY): Payer: Self-pay | Admitting: Gastroenterology

## 2024-10-06 DIAGNOSIS — M0579 Rheumatoid arthritis with rheumatoid factor of multiple sites without organ or systems involvement: Secondary | ICD-10-CM

## 2024-10-06 NOTE — Patient Outreach (Unsigned)
 Complex Care Management   Visit Note  10/06/2024  Name:  Hannah [name] MRN: [id number] DOB: March 17, 1946  Situation: Referral received for Complex Care Management related to  RA, OA, SLE, HTN, Hypothyroidism, CKD, s/p right TKA, Iron  deficiency Anemia. I obtained verbal consent from Patient.  Visit completed with Patient on the phone.  Background:   Past Medical History:  Diagnosis Date   Allergy  25 years ago.   Anemia    Arthritis    Asthma    Atrial fibrillation (HCC)    Cataract Had cataracts surgery 2015 & 2016   Chronic kidney disease    Diabetes mellitus without complication (HCC)    Gout 12/17/2014   patient reported   Heart murmur 4 years ago   Hyperlipidemia    Hypertension    Hypothyroidism    Systemic lupus erythematosus (HCC)    Ulcer    Vitiligo     Assessment: Patient Reported Symptoms:  Cognitive Cognitive Status: Alert and oriented to person, place, and time, Normal speech and language skills Cognitive/Intellectual Conditions Management [RPT]: None reported or documented in medical history or problem list   Health Maintenance Behaviors: Annual physical exam, Healthy diet, Exercise Health Facilitated by: Rest, Healthy diet  Neurological Neurological Review of Symptoms: No symptoms reported    HEENT HEENT Symptoms Reported: No symptoms reported      Cardiovascular Cardiovascular Symptoms Reported: No symptoms reported    Respiratory Respiratory Symptoms Reported: No symptoms reported    Endocrine Endocrine Symptoms Reported: No symptoms reported    Gastrointestinal Gastrointestinal Symptoms Reported: Other Other Gastrointestinal Symptoms: s/p GI bleed, scheduled for a repeat upper Endoscopy per Hannah [name] on 10/20/24 Gastrointestinal Management Strategies: Medication therapy (routine screening) Gastrointestinal Self-Management Outcome: 4 (good)    Genitourinary Genitourinary Symptoms Reported: No symptoms reported    Integumentary Integumentary  Symptoms Reported: Not assessed    Musculoskeletal Musculoskelatal Symptoms Reviewed: No symptoms reported        Psychosocial Psychosocial Symptoms Reported: No symptoms reported   Major Change/Loss/Stressor/Fears (CP): Medical condition, self Techniques to Cope with Loss/Stress/Change: Diversional activities Quality of Family Relationships: helpful, involved, supportive Do you feel physically threatened by others?: No    10/06/2024    PHQ2-9 Depression Screening   Hannah [name] interest or pleasure in doing things    Feeling down, depressed, or hopeless    PHQ-2 - Total Score    Trouble falling or staying asleep, or sleeping too much    Feeling tired or having Hannah [name] energy    Poor appetite or overeating     Feeling bad about yourself - or that you are a failure or have let yourself or your family down    Trouble concentrating on things, such as reading the newspaper or watching television    Moving or speaking so slowly that other people could have noticed.  Or the opposite - being so fidgety or restless that you have been moving around a lot more than usual    Thoughts that you would be better off dead, or hurting yourself in some way    PHQ2-9 Total Score    If you checked off any problems, how difficult have these problems made it for you to do your work, take care of things at home, or get along with other people    Depression Interventions/Treatment      There were no vitals filed for this visit. Pain Scale: Not given for pain  Medications Reviewed Today     Reviewed by Hannah [name]  L, [name] (Registered Nurse) on 10/06/24 at 1119  Med List Status: <None>   Medication Order Taking? Sig Documenting Provider Last Dose Status Informant  Accu-Chek Softclix Lancets lancets [id number] No Use to check blood sugars daily E11.69 Hannah [name] 08/10/2024 Active Self, Pharmacy Records  albuterol  (VENTOLIN  HFA) 108 (90 Base) MCG/ACT inhaler [id number]  Inhale 2 puffs into the lungs every 6  (six) hours as needed for wheezing or shortness of breath. Hannah [name]  Active   Alcohol  Swabs (ALCOHOL  PADS) 70 % PADS [id number] No Use as directed to check blood sugars 1 time per day dx: e11.22 Hannah [name] 08/10/2024 Active Self, Pharmacy Records  allopurinol  (ZYLOPRIM ) 100 MG tablet [id number] No TAKE 1 TABLET BY MOUTH 3 TIMES A WEEK Hannah [name] 08/10/2024 Active Self, Pharmacy Records  amLODipine  (NORVASC ) 5 MG tablet [id number] No TAKE 1 TABLET(5 MG) BY MOUTH AT BEDTIME Hannah [name] 08/11/2024 Morning Active Self, Pharmacy Records  apixaban  (ELIQUIS ) 5 MG TABS tablet [id number] No Take 1 tablet (5 mg total) by mouth 2 (two) times daily. Hannah [name] 08/09/2024 Active Self, Pharmacy Records  Ascorbic Acid (VITAMIN C PO) [id number] No Take 500 mg by mouth daily with lunch. Provider, Historical, [name] 08/10/2024 Active Self, Pharmacy Records  atorvastatin  (LIPITOR) 80 MG tablet [id number] No TAKE 1 TABLET BY MOUTH EVERY DAY Hannah [name] 08/11/2024 Morning Active   B Complex-C (B-COMPLEX WITH VITAMIN C) tablet [id number] No Take 1 tablet by mouth daily with lunch. Provider, Historical, [name] 08/10/2024 Active Self, Pharmacy Records  budesonide -formoterol  (SYMBICORT ) 160-4.5 MCG/ACT inhaler [id number]  INHALE 2 PUFFS BY MOUTH TWICE DAILY IN THE MORNING AND IN THE KARNA Hannah [name]  Active   Calcium  Carb-Cholecalciferol 600-800 MG-UNIT TABS [id number] No Take 1 tablet by mouth daily with lunch. Provider, Historical, [name] 08/10/2024 Active Self, Pharmacy Records  Cholecalciferol (VITAMIN D  PO) [id number] No Take 2,000 Units by mouth daily with lunch. Provider, Historical, [name] 08/10/2024 Active Self, Pharmacy Records           Med Note (Chief Walkup L   Thu Feb 10, 2024  9:35 AM)    dapagliflozin  propanediol (FARXIGA ) 10 MG TABS tablet [id number] No Take 1 tablet (10 mg total) by mouth daily before breakfast. Hannah [name] 08/11/2024 Morning Active   denosumab   (PROLIA ) 60 MG/ML SOSY injection [id number]  Inject 60 mg into the skin every 6 (six) months. Courier to rheum: [address] Hannah [name][address], [place] [address]. Appt on 12/07/2022 Hannah Reiter, [name]  Expired 08/09/24 2359 Self, Pharmacy Records           Med Note EFRAIM, ALFREIDA CROME   Tue Jul 04, 2024  6:11 PM) Patient stated her next shot is in January  glucose blood (ACCU-CHEK GUIDE) test strip [id number] No USE TO CHECK BLOOD SUGAR DAILY Hannah [name] 08/10/2024 Active Self, Pharmacy Records  hydroxychloroquine  (PLAQUENIL ) 200 MG tablet [id number]  TAKE 1 TABLET BY MOUTH TWICE DAILY MONDAY THROUGH FRIDAY ONLY Hannah Reiter, [name]  Active   irbesartan  (AVAPRO ) 300 MG tablet [id number]  TAKE 1 TABLET(300 MG) BY MOUTH DAILY Hannah [name]  Active   metoprolol  succinate (TOPROL -XL) 25 MG 24 hr tablet [id number] No Take 1 tablet (25 mg total) by mouth daily. Segal, Jared E, DO 08/11/2024 Morning Active   Multiple Vitamin (MULTIVITAMIN WITH MINERALS) TABS tablet [id number] No Take 1 tablet by mouth daily with lunch. Women's Multivitamin Provider, Historical, [name] 08/10/2024 Active Self, Pharmacy Records  pantoprazole  (PROTONIX ) 40  MG tablet [id number] No Take 40 mg by mouth every morning. Provider, Historical, [name] 08/11/2024 Morning Active Self, Pharmacy Records  Polyethyl Glycol-Propyl Glycol (SYSTANE) 0.4-0.3 % SOLN [id number] No Place 1-2 drops into both eyes 3 (three) times daily as needed (dry/irritated eyes.). Provider, Historical, [name] 08/10/2024 Active Self, Pharmacy Records  SYNTHROID  75 MCG tablet [id number] No Take 1 tablet MONDAY through FRIDAY Hannah [name] 08/11/2024 Morning Active   SYNTHROID  88 MCG tablet [id number] No TAKE 1 TABLET BY MOUTH EVERY SATURDAY AND SUNDAY Hannah [name] Past Week Active             Recommendation:   Specialty provider follow-up   Hannah [name] on 10/21/23 for Upper Endoscopy as directed    11/02/2024 Status: Sch   Time: 9:40 AM Length: 20  Visit  Type: OFFICE VISIT [1004] Copay: $15.00  Provider: Dolphus Reiter, [name] Department: [facility]   Follow Up Plan:   Telephone follow up appointment date/time  11/08/2024 Status: Sch   Time: 9:30 AM Length: 30  Visit Type: VBCI TELEPHONE CALL 30 [2502] Copay: $0.00  Provider: Morgan Clayborne CROME, [name] Department: [facility]   Clayborne Morgan [name] BSN CCM [place]  [facility], [facility] Nurse Care Coordinator  Direct Dial: [phone] Website: [email or web address]

## 2024-10-06 NOTE — Patient Instructions (Signed)
 Visit Information  Thank you for taking time to visit with me today. Please don't hesitate to contact me if I can be of assistance to you before our next scheduled appointment.  Your next care management appointment is by telephone on Wednesday, February 11 at 09:30 AM  Please call the care guide team at [phone] if you need to cancel, schedule, or reschedule an appointment.   Please call [phone]-TALK (toll free, 24 hour hotline) if you are experiencing a Mental Health or Behavioral Health Crisis or need someone to talk to.  Clayborne Ly RN BSN CCM [place]  [facility], [facility] Health Nurse Care Coordinator  Direct Dial: [phone] Website: [email or web address]

## 2024-10-19 NOTE — Anesthesia Preprocedure Evaluation (Signed)
 "                                  Anesthesia Evaluation  Patient identified by MRN, date of birth, ID band Patient awake    Reviewed: Allergy  & Precautions, NPO status , Patient's Chart, lab work & pertinent test results  History of Anesthesia Complications Negative for: history of anesthetic complications  Airway Mallampati: II  TM Distance: >3 FB Neck ROM: Full    Dental  (+) Dental Advisory Given, Caps   Pulmonary neg shortness of breath, asthma , neg sleep apnea, neg COPD, neg recent URI   Pulmonary exam normal breath sounds clear to auscultation       Cardiovascular hypertension, (-) angina (-) Past MI, (-) Cardiac Stents and (-) CABG + dysrhythmias (1st degree AV block) Atrial Fibrillation + Valvular Problems/Murmurs (bicuspid AV with mild AS)  Rhythm:Regular Rate:Normal + Systolic murmurs HLD  TTE 08/14/2024: IMPRESSIONS    1. Left ventricular ejection fraction, by estimation, is 60 to 65%. Left  ventricular ejection fraction by 3D volume is 58 %. The left ventricle has  normal function. The left ventricle has no regional wall motion  abnormalities. There is mild asymmetric  left ventricular hypertrophy of the basal-septal segment. Left ventricular  diastolic parameters are consistent with Grade I diastolic dysfunction  (impaired relaxation). The average left ventricular global longitudinal  strain is -18.3 %. The global  longitudinal strain is normal.   2. Right ventricular systolic function is normal. The right ventricular  size is normal. There is normal pulmonary artery systolic pressure. The  estimated right ventricular systolic pressure is 26.2 mmHg.   3. Left atrial size was mildly dilated.   4. The mitral valve is normal in structure. Mild mitral valve  regurgitation. No evidence of mitral stenosis.   5. The aortic valve is bicuspid, the left and right cusps have a fused  raphe. There is moderate calcification of the aortic valve. Aortic valve   regurgitation is not visualized. Mild aortic valve stenosis. Aortic valve  area, by VTI measures 1.72 cm.  Aortic valve mean gradient measures 14.0 mmHg.   6. Aortic dilatation noted. There is borderline dilatation of the  ascending aorta, measuring 38 mm.   7. The inferior vena cava is normal in size with greater than 50%  respiratory variability, suggesting right atrial pressure of 3 mmHg.     Neuro/Psych negative neurological ROS     GI/Hepatic Neg liver ROS,GERD  ,,Angiodysplasia of stomach   Endo/Other  diabetes, Type 2Hypothyroidism  Hyperparathyroidism   Renal/GU CRFRenal disease     Musculoskeletal  (+) Arthritis , Osteoarthritis and Rheumatoid disorders,    Abdominal  (+) + obese  Peds  Hematology  (+) Blood dyscrasia (MGUS), anemia Lab Results      Component                Value               Date                      WBC                      5.3                 08/01/2024                HGB  8.8 (L)             08/01/2024                HCT                      27.7 (L)            08/01/2024                MCV                      98.2                08/01/2024                PLT                      209                 08/01/2024              Anesthesia Other Findings SLE, gout  Last Eliquis : 10/17/2024  Reproductive/Obstetrics                              Anesthesia Physical Anesthesia Plan  ASA: 3  Anesthesia Plan: MAC   Post-op Pain Management: Minimal or no pain anticipated   Induction: Intravenous  PONV Risk Score and Plan: 2 and Propofol  infusion, TIVA and Treatment may vary due to age or medical condition  Airway Management Planned: Natural Airway and Nasal Cannula  Additional Equipment:   Intra-op Plan:   Post-operative Plan:   Informed Consent: I have reviewed the patients History and Physical, chart, labs and discussed the procedure including the risks, benefits and alternatives for  the proposed anesthesia with the patient or authorized representative who has indicated his/her understanding and acceptance.     Dental advisory given  Plan Discussed with: CRNA and Anesthesiologist  Anesthesia Plan Comments: (Discussed with patient risks of MAC including, but not limited to, minor pain or discomfort, hearing people in the room, and possible need for backup general anesthesia. Risks for general anesthesia also discussed including, but not limited to, sore throat, hoarse voice, chipped/damaged teeth, injury to vocal cords, nausea and vomiting, allergic reactions, lung infection, heart attack, stroke, and death. All questions answered. )         Anesthesia Quick Evaluation  "

## 2024-10-20 ENCOUNTER — Ambulatory Visit (HOSPITAL_COMMUNITY): Payer: Self-pay | Admitting: Anesthesiology

## 2024-10-20 ENCOUNTER — Encounter (HOSPITAL_COMMUNITY): Payer: Self-pay | Admitting: Gastroenterology

## 2024-10-20 ENCOUNTER — Ambulatory Visit (HOSPITAL_COMMUNITY)
Admission: RE | Admit: 2024-10-20 | Discharge: 2024-10-20 | Disposition: A | Discharge status: Home / Self Care | Attending: Gastroenterology | Admitting: Gastroenterology

## 2024-10-20 ENCOUNTER — Other Ambulatory Visit: Payer: Self-pay

## 2024-10-20 ENCOUNTER — Encounter (HOSPITAL_COMMUNITY)
Admission: RE | Disposition: A | Discharge status: Home / Self Care | Payer: Self-pay | Source: Home / Self Care | Attending: Gastroenterology

## 2024-10-20 DIAGNOSIS — I10 Essential (primary) hypertension: Secondary | ICD-10-CM | POA: Diagnosis not present

## 2024-10-20 DIAGNOSIS — M1990 Unspecified osteoarthritis, unspecified site: Secondary | ICD-10-CM | POA: Diagnosis not present

## 2024-10-20 DIAGNOSIS — E785 Hyperlipidemia, unspecified: Secondary | ICD-10-CM | POA: Insufficient documentation

## 2024-10-20 DIAGNOSIS — N183 Chronic kidney disease, stage 3 unspecified: Secondary | ICD-10-CM | POA: Diagnosis not present

## 2024-10-20 DIAGNOSIS — E039 Hypothyroidism, unspecified: Secondary | ICD-10-CM | POA: Insufficient documentation

## 2024-10-20 DIAGNOSIS — K5730 Diverticulosis of large intestine without perforation or abscess without bleeding: Secondary | ICD-10-CM | POA: Insufficient documentation

## 2024-10-20 DIAGNOSIS — Z6835 Body mass index (BMI) 35.0-35.9, adult: Secondary | ICD-10-CM | POA: Diagnosis not present

## 2024-10-20 DIAGNOSIS — E119 Type 2 diabetes mellitus without complications: Secondary | ICD-10-CM

## 2024-10-20 DIAGNOSIS — K31819 Angiodysplasia of stomach and duodenum without bleeding: Secondary | ICD-10-CM | POA: Diagnosis not present

## 2024-10-20 DIAGNOSIS — K449 Diaphragmatic hernia without obstruction or gangrene: Secondary | ICD-10-CM | POA: Insufficient documentation

## 2024-10-20 DIAGNOSIS — I440 Atrioventricular block, first degree: Secondary | ICD-10-CM | POA: Diagnosis not present

## 2024-10-20 DIAGNOSIS — Z8711 Personal history of peptic ulcer disease: Secondary | ICD-10-CM | POA: Diagnosis not present

## 2024-10-20 DIAGNOSIS — K219 Gastro-esophageal reflux disease without esophagitis: Secondary | ICD-10-CM | POA: Insufficient documentation

## 2024-10-20 DIAGNOSIS — E213 Hyperparathyroidism, unspecified: Secondary | ICD-10-CM | POA: Insufficient documentation

## 2024-10-20 DIAGNOSIS — D509 Iron deficiency anemia, unspecified: Secondary | ICD-10-CM | POA: Diagnosis not present

## 2024-10-20 DIAGNOSIS — E669 Obesity, unspecified: Secondary | ICD-10-CM | POA: Insufficient documentation

## 2024-10-20 DIAGNOSIS — Z833 Family history of diabetes mellitus: Secondary | ICD-10-CM | POA: Diagnosis not present

## 2024-10-20 DIAGNOSIS — I129 Hypertensive chronic kidney disease with stage 1 through stage 4 chronic kidney disease, or unspecified chronic kidney disease: Secondary | ICD-10-CM | POA: Insufficient documentation

## 2024-10-20 DIAGNOSIS — I4891 Unspecified atrial fibrillation: Secondary | ICD-10-CM | POA: Diagnosis not present

## 2024-10-20 DIAGNOSIS — J45909 Unspecified asthma, uncomplicated: Secondary | ICD-10-CM | POA: Insufficient documentation

## 2024-10-20 DIAGNOSIS — M069 Rheumatoid arthritis, unspecified: Secondary | ICD-10-CM | POA: Insufficient documentation

## 2024-10-20 DIAGNOSIS — E1122 Type 2 diabetes mellitus with diabetic chronic kidney disease: Secondary | ICD-10-CM | POA: Insufficient documentation

## 2024-10-20 DIAGNOSIS — D472 Monoclonal gammopathy: Secondary | ICD-10-CM | POA: Diagnosis not present

## 2024-10-20 DIAGNOSIS — K222 Esophageal obstruction: Secondary | ICD-10-CM | POA: Diagnosis not present

## 2024-10-20 DIAGNOSIS — K31811 Angiodysplasia of stomach and duodenum with bleeding: Secondary | ICD-10-CM | POA: Diagnosis present

## 2024-10-20 LAB — GLUCOSE, CAPILLARY
Glucose-Capillary: 82 mg/dL (ref 70–99)
Glucose-Capillary: 73 mg/dL (ref 70–99)

## 2024-10-20 MED ORDER — LIDOCAINE 2% (20 MG/ML) 5 ML SYRINGE
INTRAMUSCULAR | Status: DC | PRN
  Administered 2024-10-20: 40 mg via INTRAVENOUS

## 2024-10-20 MED ORDER — PROPOFOL 500 MG/50ML IV EMUL
INTRAVENOUS | Status: DC | PRN
  Administered 2024-10-20: 180 ug/kg/min via INTRAVENOUS

## 2024-10-20 MED ORDER — SODIUM CHLORIDE 0.9 % IV SOLN: INTRAVENOUS | Status: DC

## 2024-10-20 MED ORDER — LACTATED RINGERS IV SOLN: INTRAVENOUS | Status: DC | PRN

## 2024-10-20 MED ORDER — PROPOFOL 10 MG/ML IV BOLUS
INTRAVENOUS | Status: DC | PRN
  Administered 2024-10-20 (×2): 20 mg via INTRAVENOUS

## 2024-10-20 NOTE — Discharge Instructions (Signed)
YOU HAD AN ENDOSCOPIC PROCEDURE TODAY: Refer to the procedure report and other information in the discharge instructions given to you for any specific questions about what was found during the examination. If this information does not answer your questions, please call [facility] at [phone] to clarify.   YOU SHOULD EXPECT: Some feelings of bloating in the abdomen. Passage of more gas than usual. Walking can help get rid of the air that was put into your GI tract during the procedure and reduce the bloating. Some abdominal soreness may be present for a day or two, also.  DIET: Your first meal following the procedure should be a light meal and then it is ok to progress to your normal diet. A half-sandwich or bowl of soup is an example of a good first meal. Heavy or fried foods are harder to digest and may make you feel nauseous or bloated. Drink plenty of fluids but you should avoid alcoholic beverages for 24 hours.   ACTIVITY: Your care partner should take you home directly after the procedure. You should plan to take it easy, moving slowly for the rest of the day. You can resume normal activity the day after the procedure however YOU SHOULD NOT DRIVE, use power tools, machinery or perform tasks that involve climbing or major physical exertion for 24 hours (because of the sedation medicines used during the test).   SYMPTOMS TO REPORT IMMEDIATELY: A gastroenterologist can be reached at any hour. Please call [phone]  for any of the following symptoms:   Following upper endoscopy (EGD, EUS, ERCP, esophageal dilation) Vomiting of blood or coffee ground material  New, significant abdominal pain  New, significant chest pain or pain under the shoulder blades  Painful or persistently difficult swallowing  New shortness of breath  Black, tarry-looking or red, bloody stools  FOLLOW UP:  If any biopsies were taken you will be contacted by phone or by letter within the next 1-3 weeks. Call  [phone]  if you have not heard about the biopsies in 3 weeks.  Please also call with any specific questions about appointments or follow up tests.  

## 2024-10-20 NOTE — H&P (Signed)
 Hannah [name] HPI: This 79 year old black female presents to the office for further evaluation of positive fecal occult test done 05/23/2024 with her nephrologist, Hannah [name]. She has 1 BM per day. She denies having any rectal pain. She has not noticed any rectal bleeding. She has a history of chronic iron  deficiency anemia. She had labs done 05/23/2024 that revealed a hemoglobin of 8.5 gm/dl and an Iron  of 12. She has been fatigued. Her nephrologist is setting her up to have 5 iron  infusions over the next few weeks. She has CKD Stage 3 and waiting to schedule a renal biopsy. She also is being followed by Hannah. Onesimo [hematologist/oncologist] for iron  deficiency anemia. She takes Pantoprazole  for acid reflux with good control. She has good appetite and her weight has been stable. She denies having any complaints of abdominal pain, nausea, vomiting, dysphagia or odynophagia. She denies having a family history of colon cancer or IBD. Her sister has celiac sprue. Her last colonoscopy was done on 09/27/2023 when scattered diverticula noted throughout the colon. She had an EGD on 09/08/28/2024 when a duodenal ulcer was diagnosed.  Past Medical History:  Diagnosis Date   Allergy  25 years ago.   Anemia    Arthritis    Asthma    Atrial fibrillation (HCC)    Cataract Had cataracts surgery 2015 & 2016   Chronic kidney disease    Diabetes mellitus without complication (HCC)    Gout 12/17/2014   patient reported   Heart murmur 4 years ago   Hyperlipidemia    Hypertension    Hypothyroidism    Systemic lupus erythematosus (HCC)    Ulcer    Vitiligo     Past Surgical History:  Procedure Laterality Date   BIOPSY  09/26/2023   Procedure: BIOPSY;  Surgeon: Hannah Gwendlyn DASEN, MD;  Location: [facility];  Service: Gastroenterology;;   CATARACT EXTRACTION Bilateral 2015   COLONOSCOPY WITH PROPOFOL  N/A 09/27/2023   Procedure: COLONOSCOPY WITH PROPOFOL ;  Surgeon: Hannah Lamprey, MD;  Location: [facility];   Service: Gastroenterology;  Laterality: N/A;   DILATION AND CURETTAGE OF UTERUS     DOPPLER ECHOCARDIOGRAPHY  05/2018   Internist to review with pt; potential heart murmur 06/20/18   ENTEROSCOPY N/A 08/11/2024   Procedure: ENTEROSCOPY;  Surgeon: Hannah Dover, MD;  Location: [facility];  Service: Gastroenterology;  Laterality: N/A;   ESOPHAGOGASTRODUODENOSCOPY (EGD) WITH PROPOFOL  N/A 09/26/2023   Procedure: ESOPHAGOGASTRODUODENOSCOPY (EGD) WITH PROPOFOL ;  Surgeon: Hannah Gwendlyn DASEN, MD;  Location: [facility];  Service: Gastroenterology;  Laterality: N/A;   EYE SURGERY  Cataract Surgery, 2015 & 2016.   keratosis removal  2021   KNEE CLOSED REDUCTION Left 07/06/2022   Procedure: CLOSED MANIPULATION KNEE;  Surgeon: Melodi Lerner, MD;  Location: [facility];  Service: Orthopedics;  Laterality: Left;   KNEE CLOSED REDUCTION Right 09/13/2023   Procedure: CLOSED MANIPULATION KNEE;  Surgeon: Melodi Lerner, MD;  Location: [facility];  Service: Orthopedics;  Laterality: Right;   REPLACEMENT TOTAL KNEE Left    SKIN SURGERY  11/30/2018   left side of face   TOOTH EXTRACTION     TOTAL KNEE ARTHROPLASTY Left 02/02/2022   Procedure: TOTAL KNEE ARTHROPLASTY;  Surgeon: Melodi Lerner, MD;  Location: [facility];  Service: Orthopedics;  Laterality: Left;   TOTAL KNEE ARTHROPLASTY Right 07/12/2023   Procedure: RIGHT TOTAL KNEE ARTHROPLASTY;  Surgeon: Melodi Lerner, MD;  Location: [facility];  Service: Orthopedics;  Laterality: Right;    Family History  Problem Relation  Age of Onset   Heart disease Father    Diabetes Father    Hypertension Mother    Obesity Mother    Varicose Veins Mother    Hypertension Sister    Hypertension Sister    Leukemia Sister    Cancer Sister    Obesity Sister     Social History:  reports that she has never smoked. She has never been exposed to tobacco smoke. She has never used smokeless tobacco. She reports that she does not currently use alcohol . She reports that she does not use  drugs.  Allergies: Allergies[1]  Medications: Scheduled: Continuous:  sodium chloride       Results for orders placed or performed during the hospital encounter of 10/20/24 (from the past 24 hours)  Glucose, capillary     Status: None   Collection Time: 10/20/24  8:41 AM  Result Value Ref Range   Glucose-Capillary 82 70 - 99 mg/dL   *Note: Due to a large number of results and/or encounters for the requested time period, some results have not been displayed. A complete set of results can be found in Results Review.     No results found.  ROS:  As stated above in the HPI otherwise negative.  Blood pressure (!) 126/54, pulse 61, temperature 98.3 F (36.8 C), temperature source Temporal, resp. rate 13, height 4' 10.5 (1.486 m), weight 77.6 kg, SpO2 100%.    PE: Gen: NAD, Alert and Oriented HEENT:  [place]/AT, EOMI Neck: Supple, no LAD Lungs: CTA Bilaterally CV: RRR without M/G/R ABD: Soft, NTND, +BS Ext: No C/C/E  Assessment/Plan: 1) History of GAVE and small bowel AVMs - enteroscopy.  Hannah [name] D 10/20/2024, 9:39 AM         [1]  Allergies Allergen Reactions   Shellfish Allergy  Anaphylaxis, Hives and Other (See Comments)   Shellfish Protein-Containing Drug Products Anaphylaxis and Other (See Comments)

## 2024-10-20 NOTE — Transfer of Care (Signed)
 Immediate Anesthesia Transfer of Care Note  Patient: Hannah [name]  Procedure(s) Performed: EGD (ESOPHAGOGASTRODUODENOSCOPY) EGD, WITH ARGON PLASMA COAGULATION  Patient Location: Endoscopy Unit  Anesthesia Type:MAC  Level of Consciousness: awake  Airway & Oxygen  Therapy: Patient Spontanous Breathing and Patient connected to face mask oxygen   Post-op Assessment: Report given to RN and Post -op Vital signs reviewed and stable  Post vital signs: Reviewed and stable  Last Vitals:  Vitals Value Taken Time  BP    Temp    Pulse 81 10/20/24 10:54  Resp 24 10/20/24 10:54  SpO2 98 % 10/20/24 10:54  Vitals shown include unfiled device data.  Last Pain:  Vitals:   10/20/24 0826  TempSrc: Temporal  PainSc: 0-No pain         Complications: No notable events documented.

## 2024-10-20 NOTE — Anesthesia Postprocedure Evaluation (Signed)
"   Anesthesia Post Note  Patient: Hannah [name]  Procedure(s) Performed: EGD (ESOPHAGOGASTRODUODENOSCOPY) EGD, WITH ARGON PLASMA COAGULATION     Patient location during evaluation: PACU Anesthesia Type: MAC Level of consciousness: awake Pain management: pain level controlled Vital Signs Assessment: post-procedure vital signs reviewed and stable Respiratory status: spontaneous breathing, nonlabored ventilation and respiratory function stable Cardiovascular status: stable and blood pressure returned to baseline Postop Assessment: no apparent nausea or vomiting Anesthetic complications: no   No notable events documented.  Last Vitals:  Vitals:   10/20/24 1100 10/20/24 1110  BP: (!) 115/50 (!) 142/58  Pulse: 73 (!) 58  Resp: (!) 21 14  Temp:  (!) 36.3 C  SpO2: 96% 99%    Last Pain:  Vitals:   10/20/24 1110  TempSrc: Temporal  PainSc: 0-No pain                 [name]      "

## 2024-10-20 NOTE — Op Note (Addendum)
 [facility] Patient Name: Hannah [name] Procedure Date: 10/20/2024 MRN: [id number] Attending MD: Belvie Just , MD, [id number] Date of Birth: 21-Dec-1945 CSN: [id number] Age: 79 Admit Type: Outpatient Procedure:                Upper GI endoscopy Indications:              Therapeutic procedure Providers:                Belvie Just, MD, Ritta Debbie Alert, RN,                            Curtistine Bishop, Technician Referring MD:              Medicines:                Propofol  per Anesthesia Complications:            No immediate complications. Estimated Blood Loss:     Estimated blood loss: none. Procedure:                After obtaining informed consent, the endoscope was                            passed under direct vision. Throughout the                            procedure, the patient's blood pressure, pulse, and                            oxygen  saturations were monitored continuously. The                            [id number]-[id number] ([id number] Olympus colonoscope was                            introduced through the mouth and advanced to the                            second part of duodenum. After obtaining informed                            consent, the endoscope was passed under direct                            vision. Throughout the procedure, the patient's                            blood pressure, pulse, and oxygen  saturations were                            monitored continuously.The upper GI endoscopy was                            accomplished without difficulty. The patient                            tolerated the procedure well.  Scope In: Scope Out: Findings:      One benign-appearing, intrinsic mild stenosis was found at the       gastroesophageal junction. This stenosis measured 1.5 cm (inner       diameter). The stenosis was traversed.      A 3 cm hiatal hernia was present.      Mild gastric antral vascular ectasia without bleeding was  present in the       gastric antrum. Coagulation for tissue destruction using monopolar probe       was successful.      The examined duodenum was normal.      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 Further abaltion was performed with       APC. Impression:               - Benign-appearing esophageal stenosis.                           - 3 cm hiatal hernia.                           - Gastric antral vascular ectasia without bleeding.                            Treated with a monopolar probe.                           - Normal examined duodenum.                           - No specimens collected. Moderate Sedation:      Not Applicable - Patient had care per Anesthesia. Recommendation:           - Patient has a contact number available for                            emergencies. The signs and symptoms of potential                            delayed complications were discussed with the                            patient. Return to normal activities tomorrow.                            Written discharge instructions were provided to the                            patient.                           - Resume regular diet.                           - Continue present medications.                           - Follow up with Dr. Kristie in 1-3 months. Procedure Code(s):        ---  Professional ---                           [phone], Esophagogastroduodenoscopy, flexible,                            transoral; with ablation of tumor(s), polyp(s), or                            other lesion(s) (includes pre- and post-dilation                            and guide wire passage, when performed) Diagnosis Code(s):        --- Professional ---                           X68.180, Angiodysplasia of stomach and duodenum                            without bleeding                           K22.2, Esophageal obstruction                           K44.9, Diaphragmatic hernia without obstruction or                             gangrene CPT copyright 2022 [employer]. All rights reserved. The codes documented in this report are preliminary and upon coder review may  be revised to meet current compliance requirements. Belvie Just, MD Belvie Just, MD 10/20/2024 10:53:36 AM This report has been signed electronically. Number of Addenda: 0

## 2024-10-22 ENCOUNTER — Encounter (HOSPITAL_COMMUNITY): Payer: Self-pay | Admitting: Gastroenterology

## 2024-10-24 NOTE — Progress Notes (Unsigned)
 "  Office Visit Note  Patient: Hannah [name]             Date of Birth: 05-12-1946           MRN: [id number]             PCP: Jarold Medici, MD Referring: Jarold Medici, MD Visit Date: 11/02/2024 Occupation: Data Unavailable  Subjective:  No chief complaint on file.   History of Present Illness: Hannah [name] is a 79 y.o. female ***     Activities of Daily Living:  Patient reports morning stiffness for *** {minute/hour:19697}.   Patient {ACTIONS;DENIES/REPORTS:21021675::Denies} nocturnal pain.  Difficulty dressing/grooming: {ACTIONS;DENIES/REPORTS:21021675::Denies} Difficulty climbing stairs: {ACTIONS;DENIES/REPORTS:21021675::Denies} Difficulty getting out of chair: {ACTIONS;DENIES/REPORTS:21021675::Denies} Difficulty using hands for taps, buttons, cutlery, and/or writing: {ACTIONS;DENIES/REPORTS:21021675::Denies}  No Rheumatology ROS completed.   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   Duodenal ulcer without hemorrhage, perforation, or obstruction 05/26/2024   Epigastric pain 05/26/2024   Gastro-esophageal reflux disease without esophagitis 05/26/2024   Glomerular disorders in diseases classified elsewhere 05/26/2024   Gout 05/26/2024   Mixed hyperlipidemia 05/26/2024   Moderate persistent asthma with exacerbation 05/26/2024   Other long term (current) drug therapy 05/26/2024   Pain  of left hand 05/26/2024   Hyperbilirubinemia 05/26/2024   Acute hemorrhagic gastritis 05/26/2024   Abnormal weight loss 05/26/2024   History of total right knee replacement 05/10/2024   Hyperparathyroidism 03/19/2024   Leukonychia 03/15/2024   Class 1 obesity due to excess calories with serious comorbidity and body mass index (BMI) of 31.0 to 31.9 in adult 10/17/2023   Chronic superficial gastritis with bleeding 10/17/2023   Occult blood detected in feces by immunoassay 09/26/2023   ABLA (acute blood loss anemia) 09/23/2023   GI bleed 09/23/2023   Encounter for other specified aftercare 08/16/2023   Pure hypercholesterolemia 07/18/2023   Personal history of COVID-19 07/18/2023   Primary osteoarthritis of right knee 07/12/2023   OA (osteoarthritis) of knee 02/02/2022   Primary osteoarthritis of left knee 02/02/2022   Stiffness of right knee 12/29/2021   Pain in joint of right shoulder 10/01/2021   Postmenopausal atrophic vaginitis 08/24/2021   Acquired thrombophilia 07/08/2021   Chronic low back pain 06/25/2021   Paresthesia and pain of extremity 06/25/2021   Atrial fibrillation (HCC) 04/16/2021   Demand ischemia (HCC) 03/26/2021   Unspecified atrial fibrillation (HCC) 03/26/2021   COVID-19 virus infection 03/26/2021   Osteoarthritis of right knee 11/18/2020   Arthralgia of right knee 06/06/2020   Pain in left knee 06/06/2020   Pain in right knee 06/06/2020   Ventricular premature beats 05/06/2020   Type 2 diabetes mellitus (HCC) 03/20/2020   Palpitations 03/20/2020   Type 2 diabetes mellitus with diabetic chronic kidney disease (HCC) 03/20/2020   Primary hypothyroidism 03/20/2020   Vitiligo 03/20/2020   Iron  deficiency anemia 08/12/2017   Osteoporosis 10/02/2016   Systemic lupus erythematosus (HCC) 10/02/2016   Rheumatoid arthritis involving multiple joints (HCC) 10/02/2016  High risk medication use 10/02/2016   History of chronic kidney disease 10/02/2016   Idiopathic  chronic gout of multiple sites without tophus 10/02/2016   Primary osteoarthritis of both knees 10/02/2016   Vitamin D  deficiency 10/02/2016   History of hypertension 10/02/2016   History of asthma 10/02/2016   Family history of chronic renal impairment 10/02/2016   Chronic gouty arthritis 10/02/2016   Primary osteoarthritis of knees, bilateral 10/02/2016   Normocytic anemia 03/21/2015   Essential hypertension 03/20/2015   Candidiasis of mouth 03/02/2012   Abnormal CT of the chest 01/15/2012   Asthma 01/07/2009   Diabetes mellitus (HCC) 01/07/2009    Past Medical History:  Diagnosis Date   Allergy  25 years ago.   Anemia    Arthritis    Asthma    Atrial fibrillation (HCC)    Cataract Had cataracts surgery 2015 & 2016   Chronic kidney disease    Diabetes mellitus without complication (HCC)    Gout 12/17/2014   patient reported   Heart murmur 4 years ago   Hyperlipidemia    Hypertension    Hypothyroidism    Systemic lupus erythematosus (HCC)    Ulcer    Vitiligo     Family History  Problem Relation Age of Onset   Heart disease Father    Diabetes Father    Hypertension Mother    Obesity Mother    Varicose Veins Mother    Hypertension Sister    Hypertension Sister    Leukemia Sister    Cancer Sister    Obesity Sister    Past Surgical History:  Procedure Laterality Date   BIOPSY  09/26/2023   Procedure: BIOPSY;  Surgeon: Aneita Gwendlyn DASEN, MD;  Location: [facility];  Service: Gastroenterology;;   CATARACT EXTRACTION Bilateral 2015   COLONOSCOPY WITH PROPOFOL  N/A 09/27/2023   Procedure: COLONOSCOPY WITH PROPOFOL ;  Surgeon: Kristie Lamprey, MD;  Location: [facility];  Service: Gastroenterology;  Laterality: N/A;   DILATION AND CURETTAGE OF UTERUS     DOPPLER ECHOCARDIOGRAPHY  05/2018   Internist to review with pt; potential heart murmur 06/20/18   ENTEROSCOPY N/A 08/11/2024   Procedure: ENTEROSCOPY;  Surgeon: Rollin Dover, MD;  Location: [facility];  Service:  Gastroenterology;  Laterality: N/A;   ESOPHAGOGASTRODUODENOSCOPY N/A 10/20/2024   Procedure: EGD (ESOPHAGOGASTRODUODENOSCOPY);  Surgeon: Rollin Dover, MD;  Location: [facility];  Service: Gastroenterology;  Laterality: N/A;   ESOPHAGOGASTRODUODENOSCOPY (EGD) WITH PROPOFOL  N/A 09/26/2023   Procedure: ESOPHAGOGASTRODUODENOSCOPY (EGD) WITH PROPOFOL ;  Surgeon: Aneita Gwendlyn DASEN, MD;  Location: [facility];  Service: Gastroenterology;  Laterality: N/A;   EYE SURGERY  Cataract Surgery, 2015 & 2016.   HOT HEMOSTASIS N/A 10/20/2024   Procedure: EGD, WITH ARGON PLASMA COAGULATION;  Surgeon: Rollin Dover, MD;  Location: [facility];  Service: Gastroenterology;  Laterality: N/A;   keratosis removal  2021   KNEE CLOSED REDUCTION Left 07/06/2022   Procedure: CLOSED MANIPULATION KNEE;  Surgeon: Melodi Lerner, MD;  Location: [facility];  Service: Orthopedics;  Laterality: Left;   KNEE CLOSED REDUCTION Right 09/13/2023   Procedure: CLOSED MANIPULATION KNEE;  Surgeon: Melodi Lerner, MD;  Location: [facility];  Service: Orthopedics;  Laterality: Right;   REPLACEMENT TOTAL KNEE Left    SKIN SURGERY  11/30/2018   left side of face   TOOTH EXTRACTION     TOTAL KNEE ARTHROPLASTY Left 02/02/2022   Procedure: TOTAL KNEE ARTHROPLASTY;  Surgeon: Melodi Lerner, MD;  Location: [facility];  Service: Orthopedics;  Laterality: Left;   TOTAL KNEE ARTHROPLASTY  Right 07/12/2023   Procedure: RIGHT TOTAL KNEE ARTHROPLASTY;  Surgeon: Melodi Lerner, MD;  Location: [facility];  Service: Orthopedics;  Laterality: Right;   Social History[1] Social History   Social History Narrative   Not on file     Immunization History  Administered Date(s) Administered   19-influenza Whole 07/15/2012   DTaP 06/18/2019   Fluad Quad(high Dose 65+) 06/19/2020, 06/03/2021   Fluad Trivalent(High Dose 65+) 09/25/2023, 07/24/2024   H1N1 08/09/2008   INFLUENZA, HIGH DOSE SEASONAL PF 05/18/2019, 06/04/2022   Influenza Split 07/27/2008, 06/28/2014    Influenza,inj,Quad PF,6+ Mos 06/20/2018   MMR 06/20/2010   Moderna Covid-19 Fall Seasonal Vaccine 64yrs & older 07/27/2022   Moderna Covid-19 Vaccine Bivalent Booster 50yrs & up 06/22/2024   Moderna SARS-COV2 Booster Vaccination 01/01/2021   Moderna Sars-Covid-2 Vaccination 11/20/2019, 12/19/2019, 08/06/2020, 01/01/2021, 08/13/2021   Pfizer Covid-19 Vaccine Bivalent Booster 66yrs & up 07/27/2022   Pneumococcal Conjugate-13 05/04/2018   Pneumococcal Polysaccharide-23 05/18/2019   Pneumococcal-Unspecified 06/28/2014   RSV,unspecified 10/22/2022   Tdap 06/15/2019   Tetanus 07/22/2005   Zoster Recombinant(Shingrix) 02/19/2021, 04/24/2021     Objective: Vital Signs: There were no vitals taken for this visit.   Physical Exam   Musculoskeletal Exam: ***  CDAI Exam: CDAI Score: -- Patient Global: --; Provider Global: -- Swollen: --; Tender: -- Joint Exam 11/02/2024   No joint exam has been documented for this visit   There is currently no information documented on the homunculus. Go to the Rheumatology activity and complete the homunculus joint exam.  Investigation: No additional findings.  Imaging: MR ABDOMEN WWO CONTRAST Result Date: 09/29/2024 CLINICAL DATA:  Renal mass. EXAM: MRI ABDOMEN WITHOUT AND WITH CONTRAST TECHNIQUE: Multiplanar multisequence MR imaging of the abdomen was performed both before and after the administration of intravenous contrast. CONTRAST:  7.5 mL of Vueway . COMPARISON:  Renal ultrasound from 07/05/2024. FINDINGS: The technologist noted limited exam due to patient related factors/difficulty holding breath. Lower chest: Unremarkable MR appearance to the lung bases. No pleural effusion. No pericardial effusion. Normal heart size. Note is made of small left posteromedial fat containing diaphragmatic hernia. Hepatobiliary: The liver is normal in size. Noncirrhotic configuration. There is diffuse T2 hypointense signal intensity of the liver and spleen, which  exhibits loss of signal on in-phase images, in comparison to the out-of-phase images. Findings favor deposition of paramagnetic substances such as iron , melena, etc. Correlate clinically for secondary hemochromatosis. No intrahepatic or extrahepatic bile duct dilatation. No choledocholithiasis. Unremarkable gallbladder. Pancreas: Mild-to-moderate diffuse atrophy of pancreas noted. There are multiple (more than 10) cystic/T2 hyperintense lesions throughout the pancreas with largest in the pancreatic body measuring up to 1.1 x 1.5 cm. Evaluation of the lesions on postcontrast images is markedly limited due to significant motion the. There is no direct communication of these lesions with pancreatic main duct or side branch. 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 When compared to the prior exam from 2017, there is interval growth of these lesions however, no interval development of aggressive features. No peripancreatic fat stranding. Main pancreatic duct is not dilated. Spleen: Size within normal limits. No focal mass. There is diffuse T2 hypointense signal intensity of the liver and spleen, which exhibits loss of signal on in-phase images, in comparison to the out-of-phase images. Findings favor deposition of paramagnetic substances such as iron , Melanin, etc. Correlate clinically for secondary hemochromatosis. Adrenals/Urinary Tract: Unremarkable adrenal glands. No hydroureteronephrosis. There are multiple simple cysts throughout bilateral kidneys with largest bilobed cyst arising  from the left kidney upper pole measuring up to 1.6 x 2.1 cm. There is a partially exophytic approximately 1.7 x 1.8 cm structure arising from the left kidney interpolar region, laterally (series 13, image 52 and series 11, image 18), which most likely corresponds to the lesion described on the recent ultrasound. There is T1 hyperintense area along the left side of the lesion. There is T1  hypointense and T2 slightly hyperintense area along the right side of the lesion. No diffusion restriction on high B value images. No micro or macroscopic fat within. However, evaluation of the lesion is markedly limited on the postcontrast images due to extensive motion. There is no significant enhancement in the T1 hyperintense area along the left side of the lesion on subtraction images. However, there is enhancement along the right side of the lesion. Findings are therefore concerning for renal neoplasm however, noting exam is significantly limited due to motion. Stomach/Bowel: There is a small sliding hiatal hernia. Visualized portions within the abdomen are unremarkable. No disproportionate dilation of bowel loops. There are multiple colonic diverticula without diverticulitis. Vascular/Lymphatic: No pathologically enlarged lymph nodes identified. No abdominal aortic aneurysm demonstrated. No ascites. There is a well-circumscribed fluid signal intensity area in the right lower quadrant measuring 2.0 x 2.3 cm. This is incompletely characterized on the current exam but favored benign etiology such as a mesenteric cyst. Other:  There is a small fat containing umbilical hernia. Musculoskeletal: No suspicious bone lesions identified. IMPRESSION: 1. The exam is markedly limited due to extensive motion. 2. There is a 1.7 x 1.8 cm partially exophytic structure arising from the left kidney interpolar region, laterally, which corresponds to the lesion described on the recent ultrasound. There is no diffusion restriction on high B value images. However, there is enhancement along the right side of the lesion. Findings are concerning for renal neoplasm however, noting exam is significantly limited due to motion. Consider follow-up exam with renal mass protocol CT scan (which is less susceptible to motion artifacts) in 3-6 months. 3. There are multiple cystic/T2 hyperintense lesions throughout the pancreas with largest in  the pancreatic body measuring up to 1.1 x 1.5 cm. 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 When compared to the prior exam from 2017, there is interval growth of these lesions however, no interval development of aggressive features. Follow-up exam with MRI abdomen with contrast/pancreas protocol is recommended in 1 year. 4. There is diffuse T2 hypointense signal intensity of the liver and spleen, which exhibits loss of signal on in-phase images, in comparison to the out-of-phase images. Findings favor deposition of paramagnetic substances such as iron , melanin, etc. Correlate clinically for secondary hemochromatosis. 5. Multiple other nonacute observations, as described above. Electronically Signed   By: Ree Molt M.D.   On: 09/29/2024 13:30    Recent Labs: Lab Results  Component Value Date   WBC 5.3 08/01/2024   HGB 8.8 (L) 08/01/2024   PLT 209 08/01/2024   NA 143 08/01/2024   K 4.2 08/01/2024   CL 107 08/01/2024   CO2 29 08/01/2024   GLUCOSE 92 08/01/2024   BUN 18 08/01/2024   CREATININE 1.56 (H) 08/01/2024   BILITOT 0.3 08/01/2024   ALKPHOS 71 08/01/2024   AST 31 08/01/2024   ALT 22 08/01/2024   PROT 6.6 08/01/2024   ALBUMIN 3.9 08/01/2024   CALCIUM  9.5 08/01/2024   GFRAA 44 (L) 12/19/2020   May 11, 2024 ANA 1: 1280 nuclear, centromere, 1: 80 speckled, dsDNA  22, RNP negative, Smith negative, C3-C4 normal, sed rate 9, uric acid 4.8  September 26, 2024 DEXA scan T-score -2.7, BMD 0.551 lumbar spine, +2% change in the right total femur  Speciality Comments: PLQ eye exam: 02/16/2024 [facility] Follow up in 1 year  Procedures:  No procedures performed Allergies: Shellfish allergy  and Shellfish protein-containing drug products   Assessment / Plan:     Visit Diagnoses: Other systemic lupus erythematosus with other organ involvement (HCC) - Positive ANA, positive double-stranded DNA, positive Smith, positive RNP,  arthralgias:  Rheumatoid arthritis involving multiple sites with positive rheumatoid factor (HCC) - Positive rheumatoid factor, positive anti-CCP:  High risk medication use - Plaquenil  200 mg p.o. twice daily Monday to Friday.  Plaquenil  eye examination Feb 16, 2024.  Idiopathic chronic gout of multiple sites without tophus - On allopurinol  100 mg 3 times a week.  Bilateral hand pain - Ultrasound examination at the last visit did not show any synovitis.  Status post total knee replacement, left - 02/02/2022 by Dr. Hiram.  Status post total knee replacement, right - 07/12/23 by Dr. Hiram.  Age-related osteoporosis without current pathological fracture - September 26, 2024 DEXA scan T-score -2.7, BMD 0.551 lumbar spine, +2% change in the right total femur.prolia  60 mg sq 11/20/25injections every 6 months  Vitamin D  deficiency  MGUS (monoclonal gammopathy of unknown significance)  History of hypertension  History of diabetes mellitus  Paroxysmal atrial fibrillation (HCC)  Chronic anticoagulation  History of chronic kidney disease  History of asthma  History of anemia  Orders: No orders of the defined types were placed in this encounter.  No orders of the defined types were placed in this encounter.   Face-to-face time spent with patient was *** minutes. Greater than 50% of time was spent in counseling and coordination of care.  Follow-Up Instructions: No follow-ups on file.   Maya Nash, MD  Note - This record has been created using Animal nutritionist.  Chart creation errors have been sought, but may not always  have been located. Such creation errors do not reflect on  the standard of medical care.    [1]  Social History Tobacco Use   Smoking status: Never    Passive exposure: Never   Smokeless tobacco: Never  Vaping Use   Vaping status: Never Used  Substance Use Topics   Alcohol  use: Not Currently    Comment: Maybe a glass of wine every 3 months.    Drug use: No   "

## 2024-10-26 ENCOUNTER — Ambulatory Visit: Admitting: Rheumatology

## 2024-11-02 ENCOUNTER — Ambulatory Visit: Admitting: Rheumatology

## 2024-11-02 DIAGNOSIS — D472 Monoclonal gammopathy: Secondary | ICD-10-CM

## 2024-11-02 DIAGNOSIS — Z8679 Personal history of other diseases of the circulatory system: Secondary | ICD-10-CM

## 2024-11-02 DIAGNOSIS — M1A09X0 Idiopathic chronic gout, multiple sites, without tophus (tophi): Secondary | ICD-10-CM

## 2024-11-02 DIAGNOSIS — M810 Age-related osteoporosis without current pathological fracture: Secondary | ICD-10-CM

## 2024-11-02 DIAGNOSIS — Z8709 Personal history of other diseases of the respiratory system: Secondary | ICD-10-CM

## 2024-11-02 DIAGNOSIS — M79641 Pain in right hand: Secondary | ICD-10-CM

## 2024-11-02 DIAGNOSIS — Z8639 Personal history of other endocrine, nutritional and metabolic disease: Secondary | ICD-10-CM

## 2024-11-02 DIAGNOSIS — M0579 Rheumatoid arthritis with rheumatoid factor of multiple sites without organ or systems involvement: Secondary | ICD-10-CM

## 2024-11-02 DIAGNOSIS — I480 Paroxysmal atrial fibrillation: Secondary | ICD-10-CM

## 2024-11-02 DIAGNOSIS — Z96652 Presence of left artificial knee joint: Secondary | ICD-10-CM

## 2024-11-02 DIAGNOSIS — M3219 Other organ or system involvement in systemic lupus erythematosus: Secondary | ICD-10-CM

## 2024-11-02 DIAGNOSIS — Z7901 Long term (current) use of anticoagulants: Secondary | ICD-10-CM

## 2024-11-02 DIAGNOSIS — Z79899 Other long term (current) drug therapy: Secondary | ICD-10-CM

## 2024-11-02 DIAGNOSIS — Z87448 Personal history of other diseases of urinary system: Secondary | ICD-10-CM

## 2024-11-02 DIAGNOSIS — E559 Vitamin D deficiency, unspecified: Secondary | ICD-10-CM

## 2024-11-02 DIAGNOSIS — Z96651 Presence of right artificial knee joint: Secondary | ICD-10-CM

## 2024-11-02 DIAGNOSIS — Z862 Personal history of diseases of the blood and blood-forming organs and certain disorders involving the immune mechanism: Secondary | ICD-10-CM

## 2024-11-08 ENCOUNTER — Telehealth

## 2024-11-28 ENCOUNTER — Inpatient Hospital Stay: Attending: Hematology

## 2024-12-12 ENCOUNTER — Inpatient Hospital Stay: Admitting: Hematology

## 2025-01-01 ENCOUNTER — Ambulatory Visit: Admitting: Internal Medicine

## 2025-01-16 ENCOUNTER — Encounter: Admitting: Obstetrics and Gynecology

## 2025-01-19 ENCOUNTER — Ambulatory Visit: Admitting: Rheumatology

## 2025-02-15 ENCOUNTER — Encounter (HOSPITAL_COMMUNITY)

## 2025-03-21 ENCOUNTER — Ambulatory Visit: Admitting: Dermatology

## 2025-08-14 ENCOUNTER — Encounter: Admitting: Internal Medicine

## 2025-08-16 ENCOUNTER — Encounter: Payer: Self-pay | Admitting: Internal Medicine
